# Patient Record
Sex: Female | Born: 1960
Health system: Southern US, Community
[De-identification: ages and names within clinical notes are randomized; demographics above are authoritative.]

## PROBLEM LIST (undated history)

## (undated) DIAGNOSIS — D649 Anemia, unspecified: Secondary | ICD-10-CM

## (undated) DIAGNOSIS — K56609 Unspecified intestinal obstruction, unspecified as to partial versus complete obstruction: Principal | ICD-10-CM

## (undated) DIAGNOSIS — I10 Essential (primary) hypertension: Secondary | ICD-10-CM

## (undated) DIAGNOSIS — C801 Malignant (primary) neoplasm, unspecified: Secondary | ICD-10-CM

---
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
## 2006-05-02 ENCOUNTER — Emergency Department (HOSPITAL_COMMUNITY): Admission: EM | Admit: 2006-05-02 | Discharge: 2006-05-02 | Payer: Self-pay | Admitting: Emergency Medicine

## 2019-01-04 DIAGNOSIS — Z9221 Personal history of antineoplastic chemotherapy: Secondary | ICD-10-CM

## 2019-01-04 DIAGNOSIS — C189 Malignant neoplasm of colon, unspecified: Secondary | ICD-10-CM

## 2019-01-04 HISTORY — DX: Personal history of antineoplastic chemotherapy: Z92.21

## 2019-01-04 HISTORY — DX: Malignant neoplasm of colon, unspecified: C18.9

## 2019-01-23 ENCOUNTER — Other Ambulatory Visit: Payer: Self-pay

## 2019-01-23 ENCOUNTER — Inpatient Hospital Stay (HOSPITAL_COMMUNITY)
Admission: EM | Admit: 2019-01-23 | Discharge: 2019-02-03 | DRG: 330 | Disposition: A | Discharge status: Home Health | Payer: BC Managed Care – PPO | Attending: Internal Medicine | Admitting: Internal Medicine

## 2019-01-23 ENCOUNTER — Emergency Department (HOSPITAL_COMMUNITY): Payer: BC Managed Care – PPO

## 2019-01-23 ENCOUNTER — Encounter (HOSPITAL_COMMUNITY): Payer: Self-pay | Admitting: *Deleted

## 2019-01-23 DIAGNOSIS — Z4682 Encounter for fitting and adjustment of non-vascular catheter: Secondary | ICD-10-CM | POA: Diagnosis not present

## 2019-01-23 DIAGNOSIS — Z6831 Body mass index (BMI) 31.0-31.9, adult: Secondary | ICD-10-CM

## 2019-01-23 DIAGNOSIS — N179 Acute kidney failure, unspecified: Secondary | ICD-10-CM | POA: Diagnosis not present

## 2019-01-23 DIAGNOSIS — R739 Hyperglycemia, unspecified: Secondary | ICD-10-CM | POA: Diagnosis not present

## 2019-01-23 DIAGNOSIS — E860 Dehydration: Secondary | ICD-10-CM | POA: Diagnosis not present

## 2019-01-23 DIAGNOSIS — K6389 Other specified diseases of intestine: Secondary | ICD-10-CM | POA: Diagnosis not present

## 2019-01-23 DIAGNOSIS — F17210 Nicotine dependence, cigarettes, uncomplicated: Secondary | ICD-10-CM | POA: Diagnosis not present

## 2019-01-23 DIAGNOSIS — I10 Essential (primary) hypertension: Secondary | ICD-10-CM | POA: Diagnosis not present

## 2019-01-23 DIAGNOSIS — Z79899 Other long term (current) drug therapy: Secondary | ICD-10-CM | POA: Diagnosis not present

## 2019-01-23 DIAGNOSIS — Z841 Family history of disorders of kidney and ureter: Secondary | ICD-10-CM

## 2019-01-23 DIAGNOSIS — K56609 Unspecified intestinal obstruction, unspecified as to partial versus complete obstruction: Secondary | ICD-10-CM | POA: Diagnosis not present

## 2019-01-23 DIAGNOSIS — D62 Acute posthemorrhagic anemia: Secondary | ICD-10-CM | POA: Diagnosis not present

## 2019-01-23 DIAGNOSIS — D509 Iron deficiency anemia, unspecified: Secondary | ICD-10-CM

## 2019-01-23 DIAGNOSIS — T8189XA Other complications of procedures, not elsewhere classified, initial encounter: Secondary | ICD-10-CM | POA: Diagnosis not present

## 2019-01-23 DIAGNOSIS — I161 Hypertensive emergency: Secondary | ICD-10-CM | POA: Diagnosis not present

## 2019-01-23 DIAGNOSIS — E46 Unspecified protein-calorie malnutrition: Secondary | ICD-10-CM | POA: Diagnosis not present

## 2019-01-23 DIAGNOSIS — Z03818 Encounter for observation for suspected exposure to other biological agents ruled out: Secondary | ICD-10-CM | POA: Diagnosis not present

## 2019-01-23 DIAGNOSIS — F17200 Nicotine dependence, unspecified, uncomplicated: Secondary | ICD-10-CM | POA: Diagnosis not present

## 2019-01-23 DIAGNOSIS — E876 Hypokalemia: Secondary | ICD-10-CM | POA: Diagnosis not present

## 2019-01-23 DIAGNOSIS — C787 Secondary malignant neoplasm of liver and intrahepatic bile duct: Secondary | ICD-10-CM | POA: Diagnosis not present

## 2019-01-23 DIAGNOSIS — D72829 Elevated white blood cell count, unspecified: Secondary | ICD-10-CM | POA: Diagnosis not present

## 2019-01-23 DIAGNOSIS — C189 Malignant neoplasm of colon, unspecified: Secondary | ICD-10-CM | POA: Diagnosis not present

## 2019-01-23 DIAGNOSIS — C772 Secondary and unspecified malignant neoplasm of intra-abdominal lymph nodes: Secondary | ICD-10-CM | POA: Diagnosis present

## 2019-01-23 DIAGNOSIS — K5669 Other partial intestinal obstruction: Secondary | ICD-10-CM | POA: Diagnosis not present

## 2019-01-23 DIAGNOSIS — Z20828 Contact with and (suspected) exposure to other viral communicable diseases: Secondary | ICD-10-CM | POA: Diagnosis not present

## 2019-01-23 DIAGNOSIS — Z4659 Encounter for fitting and adjustment of other gastrointestinal appliance and device: Secondary | ICD-10-CM

## 2019-01-23 DIAGNOSIS — C799 Secondary malignant neoplasm of unspecified site: Secondary | ICD-10-CM

## 2019-01-23 DIAGNOSIS — Z8249 Family history of ischemic heart disease and other diseases of the circulatory system: Secondary | ICD-10-CM | POA: Diagnosis not present

## 2019-01-23 DIAGNOSIS — K5989 Other specified functional intestinal disorders: Secondary | ICD-10-CM | POA: Diagnosis not present

## 2019-01-23 DIAGNOSIS — C184 Malignant neoplasm of transverse colon: Secondary | ICD-10-CM | POA: Diagnosis not present

## 2019-01-23 DIAGNOSIS — R05 Cough: Secondary | ICD-10-CM | POA: Diagnosis not present

## 2019-01-23 DIAGNOSIS — K5900 Constipation, unspecified: Secondary | ICD-10-CM | POA: Diagnosis not present

## 2019-01-23 HISTORY — DX: Unspecified intestinal obstruction, unspecified as to partial versus complete obstruction: K56.609

## 2019-01-23 HISTORY — DX: Essential (primary) hypertension: I10

## 2019-01-23 LAB — CBC
HCT: 40.2 % (ref 36.0–46.0)
Hemoglobin: 12.7 g/dL (ref 12.0–15.0)
MCH: 23.1 pg — ABNORMAL LOW (ref 26.0–34.0)
MCHC: 31.6 g/dL (ref 30.0–36.0)
MCV: 73.2 fL — ABNORMAL LOW (ref 80.0–100.0)
Platelets: 299 10*3/uL (ref 150–400)
RBC: 5.49 MIL/uL — ABNORMAL HIGH (ref 3.87–5.11)
RDW: 19.8 % — ABNORMAL HIGH (ref 11.5–15.5)
WBC: 11 10*3/uL — ABNORMAL HIGH (ref 4.0–10.5)
nRBC: 0 % (ref 0.0–0.2)

## 2019-01-23 LAB — COMPREHENSIVE METABOLIC PANEL
ALT: 11 U/L (ref 0–44)
AST: 17 U/L (ref 15–41)
Albumin: 4.2 g/dL (ref 3.5–5.0)
Alkaline Phosphatase: 90 U/L (ref 38–126)
Anion gap: 14 (ref 5–15)
BUN: 13 mg/dL (ref 6–20)
CO2: 20 mmol/L — ABNORMAL LOW (ref 22–32)
Calcium: 10.9 mg/dL — ABNORMAL HIGH (ref 8.9–10.3)
Chloride: 101 mmol/L (ref 98–111)
Creatinine, Ser: 0.92 mg/dL (ref 0.44–1.00)
GFR calc Af Amer: >60 mL/min (ref >60)
GFR calc non Af Amer: >60 mL/min (ref >60)
Glucose, Bld: 141 mg/dL — ABNORMAL HIGH (ref 70–99)
Potassium: 3.5 mmol/L (ref 3.5–5.1)
Sodium: 135 mmol/L (ref 135–145)
Total Bilirubin: 0.4 mg/dL (ref 0.3–1.2)
Total Protein: 8.6 g/dL — ABNORMAL HIGH (ref 6.5–8.1)

## 2019-01-23 LAB — SURGICAL PCR SCREEN
MRSA, PCR: NEGATIVE
Staphylococcus aureus: NEGATIVE

## 2019-01-23 LAB — HEMOGLOBIN A1C
Hgb A1c MFr Bld: 5.2 % (ref 4.8–5.6)
Mean Plasma Glucose: 102.54 mg/dL

## 2019-01-23 LAB — TSH: TSH: 1.141 u[IU]/mL (ref 0.350–4.500)

## 2019-01-23 LAB — I-STAT BETA HCG BLOOD, ED (MC, WL, AP ONLY): I-stat hCG, quantitative: <5 m[IU]/mL (ref <5)

## 2019-01-23 LAB — HIV ANTIBODY (ROUTINE TESTING W REFLEX): HIV Screen 4th Generation wRfx: NONREACTIVE

## 2019-01-23 LAB — SARS CORONAVIRUS 2 (TAT 6-24 HRS): SARS Coronavirus 2: NEGATIVE

## 2019-01-23 LAB — LIPASE, BLOOD: Lipase: 17 U/L (ref 11–51)

## 2019-01-23 MED ORDER — ONDANSETRON HCL 4 MG/2ML IJ SOLN: 4.0000 mg | Freq: Once | INTRAMUSCULAR | Status: DC

## 2019-01-23 MED ORDER — SODIUM CHLORIDE 0.9 % IV SOLN
1.0000 g | INTRAVENOUS | Status: AC
  Administered 2019-01-24: 1 g via INTRAVENOUS
  Filled 2019-01-23 (×2): qty 1

## 2019-01-23 MED ORDER — ONDANSETRON HCL 4 MG/2ML IJ SOLN
4.0000 mg | Freq: Four times a day (QID) | INTRAMUSCULAR | Status: DC | PRN
  Administered 2019-01-23 – 2019-01-29 (×4): 4 mg via INTRAVENOUS
  Filled 2019-01-23 (×5): qty 2

## 2019-01-23 MED ORDER — HYDRALAZINE HCL 20 MG/ML IJ SOLN
10.0000 mg | Freq: Once | INTRAMUSCULAR | Status: AC
  Administered 2019-01-23: 08:00:00 10 mg via INTRAVENOUS
  Filled 2019-01-23: qty 1

## 2019-01-23 MED ORDER — ACETAMINOPHEN 325 MG PO TABS
650.0000 mg | ORAL_TABLET | Freq: Four times a day (QID) | ORAL | Status: DC | PRN
  Administered 2019-01-23: 650 mg via ORAL
  Filled 2019-01-23: qty 2

## 2019-01-23 MED ORDER — KETOROLAC TROMETHAMINE 30 MG/ML IJ SOLN
30.0000 mg | Freq: Once | INTRAMUSCULAR | Status: AC
  Administered 2019-01-23: 05:00:00 30 mg via INTRAVENOUS
  Filled 2019-01-23: qty 1

## 2019-01-23 MED ORDER — SORBITOL 70 % SOLN
960.0000 mL | TOPICAL_OIL | Freq: Once | ORAL | Status: DC
  Filled 2019-01-23: qty 473

## 2019-01-23 MED ORDER — HYDRALAZINE HCL 20 MG/ML IJ SOLN
5.0000 mg | INTRAMUSCULAR | Status: DC | PRN
  Administered 2019-01-23 – 2019-01-27 (×6): 5 mg via INTRAVENOUS
  Filled 2019-01-23 (×6): qty 1

## 2019-01-23 MED ORDER — ACETAMINOPHEN 650 MG RE SUPP: 650.0000 mg | Freq: Four times a day (QID) | RECTAL | Status: DC | PRN

## 2019-01-23 MED ORDER — AMLODIPINE BESYLATE 5 MG PO TABS
10.0000 mg | ORAL_TABLET | Freq: Once | ORAL | Status: AC
  Administered 2019-01-23: 10 mg via ORAL
  Filled 2019-01-23: qty 2

## 2019-01-23 MED ORDER — ONDANSETRON HCL 4 MG PO TABS: 4.0000 mg | ORAL_TABLET | Freq: Four times a day (QID) | ORAL | Status: DC | PRN

## 2019-01-23 MED ORDER — IOHEXOL 300 MG/ML  SOLN
100.0000 mL | Freq: Once | INTRAMUSCULAR | Status: AC
  Administered 2019-01-23: 100 mL via INTRAVENOUS

## 2019-01-23 MED ORDER — ONDANSETRON HCL 4 MG/2ML IJ SOLN
INTRAMUSCULAR | Status: AC
  Filled 2019-01-23: qty 2

## 2019-01-23 MED ORDER — HEPARIN SODIUM (PORCINE) 5000 UNIT/ML IJ SOLN
5000.0000 [IU] | Freq: Three times a day (TID) | INTRAMUSCULAR | Status: DC
  Administered 2019-01-23 – 2019-02-03 (×29): 5000 [IU] via SUBCUTANEOUS
  Filled 2019-01-23 (×29): qty 1

## 2019-01-23 MED ORDER — MORPHINE SULFATE (PF) 2 MG/ML IV SOLN
2.0000 mg | INTRAVENOUS | Status: DC | PRN
  Administered 2019-01-23 – 2019-01-24 (×2): 2 mg via INTRAVENOUS
  Filled 2019-01-23 (×2): qty 1

## 2019-01-23 MED ORDER — ONDANSETRON HCL 4 MG/2ML IJ SOLN
4.0000 mg | Freq: Once | INTRAMUSCULAR | Status: AC
  Administered 2019-01-23: 05:00:00 4 mg via INTRAVENOUS
  Filled 2019-01-23: qty 2

## 2019-01-23 MED ORDER — NICOTINE 14 MG/24HR TD PT24
14.0000 mg | MEDICATED_PATCH | Freq: Every day | TRANSDERMAL | Status: DC
  Administered 2019-01-23 – 2019-01-31 (×9): 14 mg via TRANSDERMAL
  Filled 2019-01-23 (×9): qty 1

## 2019-01-23 MED ORDER — SODIUM CHLORIDE 0.9 % IV BOLUS (SEPSIS)
1000.0000 mL | Freq: Once | INTRAVENOUS | Status: AC
  Administered 2019-01-23: 05:00:00 1000 mL via INTRAVENOUS

## 2019-01-23 MED ORDER — SODIUM CHLORIDE 0.9% FLUSH
3.0000 mL | Freq: Once | INTRAVENOUS | Status: AC
  Administered 2019-01-23: 05:00:00 3 mL via INTRAVENOUS

## 2019-01-23 MED ORDER — AMLODIPINE BESYLATE 5 MG PO TABS
5.0000 mg | ORAL_TABLET | Freq: Every day | ORAL | Status: DC
  Administered 2019-01-24: 08:00:00 5 mg via ORAL
  Filled 2019-01-23: qty 1

## 2019-01-23 MED ORDER — LACTATED RINGERS IV SOLN
INTRAVENOUS | Status: DC
  Administered 2019-01-23 – 2019-01-27 (×10): via INTRAVENOUS

## 2019-01-23 NOTE — Progress Notes (Signed)
Patients sister Cloyde Reams called inquiring about updates. This nurse received consent from patient to provide [facility] with updates regarding patients care. Molly updated on plans for surgery and all questions answered at this time.

## 2019-01-23 NOTE — H&P (Signed)
History and Physical    Casey Black [id number] DOB: 1961-04-05 DOA: 01/23/2019  PCP: Patient, No Pcp Per - no doctor in >10 years Consultants:  None Patient coming from:  Home - lives with oldest daughter; Casey Black: Daughter, [phone]  Chief Complaint: abdominal pain  HPI: Casey Black is a 58 y.o. female with no known past medical history significant (does not see doctors) presenting with abdominal pain.  She reports that her stomach was giving her problems.  When she would eat, it was very painful - couldn't lay down or sit up.  She felt constipated and took 2 Senokot.  About 0100, she developed n/v and was unable to stop and so her daughter brought her in.  The symptoms started maybe 2 months ago but was intermittent.  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  She has had marked unintentional weight loss in the last few months.   ED Course: SBO on Xray.  Getting CT scan.  Dr. Tomasita Morrow has seen, surgery will follow.  NGT in place.  IV hydralazine for severe HTN, given Norvasc earlier.    Review of Systems: As per HPI; otherwise review of systems reviewed and negative.   Ambulatory Status:  Ambulates without assistance  Past Medical History:  Diagnosis Date   Hypertension 01/23/2019   SBO (small bowel obstruction) ([place]) 01/23/2019    Past Surgical History:  Procedure Laterality Date   CESAREAN SECTION     x2    Social History   Socioeconomic History   Marital status: Married    Spouse name: Not on file   Number of children: Not on file   Years of education: Not on file   Highest education level: Not on file  Occupational History   Not on file  Social Needs   Financial resource strain: Not on file   Food insecurity    Worry: Not on file    Inability: Not on file   Transportation needs    Medical: Not on file    Non-medical: Not on file  Tobacco Use   Smoking status: Current Every Day Smoker    Packs/day: 1.00    Years: 26.00      Pack years: 26.00    Types: Cigarettes   Smokeless tobacco: Never Used   Tobacco comment: desires patch  Substance and Sexual Activity   Alcohol use: Yes    Comment: a pint a week   Drug use: Never   Sexual activity: Not on file  Lifestyle   Physical activity    Days per week: Not on file    Minutes per session: Not on file   Stress: Not on file  Relationships   Social connections    Talks on phone: Not on file    Gets together: Not on file    Attends religious service: Not on file    Active member of club or organization: Not on file    Attends meetings of clubs or organizations: Not on file    Relationship status: Not on file   Intimate partner violence    Fear of current or ex partner: Not on file    Emotionally abused: Not on file    Physically abused: Not on file    Forced sexual activity: Not on file  Other Topics Concern   Not on file  Social History Narrative   Not on file    No Known Allergies  Family History  Problem Relation Age of Onset  Kidney failure Father    Hypertension Father    Hypertension Sister     Prior to Admission medications   Not on File    Physical Exam: Vitals:   01/23/19 0747 01/23/19 0800 01/23/19 0815 01/23/19 1103  BP: (!) 215/83 (!) 181/88 (!) 197/69 (!) 183/67  Pulse: 69   86  Resp: (!) 23 (!) 21 (!) 22 20  Temp:    98.7 F (37.1 C)  TempSrc:    Oral  SpO2: 97%   98%      General:  Appears calm and comfortable and is NAD  Eyes:  PERRL, EOMI, normal lids, iris  ENT:  grossly normal hearing, lips & tongue, mmm; edentulous  Neck:  no LAD, masses or thyromegaly  Cardiovascular:  RRR, no m/r/g. No LE edema.   Respiratory:   CTA bilaterally with no wheezes/rales/rhonchi.  Normal respiratory effort.  Abdomen:  soft, mildly to moderately TTP primarily in LLQ, mildly distended, hypoactive BS  Back:   normal alignment, no CVAT  Skin:  no rash or induration seen on limited exam  Musculoskeletal:   grossly normal tone BUE/BLE, good ROM, no bony abnormality  Psychiatric:  blunted mood and affect, speech fluent and appropriate, AOx3  Neurologic:  CN 2-12 grossly intact, moves all extremities in coordinated fashion, sensation intact    Radiological Exams on Admission: Ct Abdomen Pelvis W Contrast  Result Date: 01/23/2019 CLINICAL DATA:  Acute abdominal pain with generalized small bowel obstruction. EXAM: CT ABDOMEN AND PELVIS WITH CONTRAST TECHNIQUE: Multidetector CT imaging of the abdomen and pelvis was performed using the standard protocol following bolus administration of intravenous contrast. CONTRAST:  100 cc OMNIPAQUE IOHEXOL 300 MG/ML  SOLN COMPARISON:  None. FINDINGS: Lower chest: Right coronary calcification. Mild right lower lobe scarring along endplate osteophytes. Hepatobiliary: Multiple partially calcified liver lesions consistent with metastatic disease given the constellation of findings, up to 25 mm in the subcapsular right liver.No evidence of biliary obstruction or stone. Pancreas: Unremarkable. Spleen: Unremarkable. Adrenals/Urinary Tract: Negative adrenals. No hydronephrosis or stone. Unremarkable bladder. Stomach/Bowel: Irregular mass at the mid transverse colon with proximal colonic and small bowel dilatation. There is lobulation along the serosal surface. No appendicitis. Vascular/Lymphatic: Mildly enlarged transverse mesocolon lymph node near the mass, 8 mm in diameter. No acute vascular finding Reproductive:Small uterine fibroids. Other: Tumor appears to extend through the serosa but there is no ascites or evident peritoneal nodularity. Musculoskeletal: Degenerative changes without acute or aggressive finding IMPRESSION: Obstructing mid transverse colonic mass with mild regional adenopathy and hepatic metastatic disease. The mass likely extends through the serosa; no ascites or peritoneal nodularity. Electronically Signed   By: Monte Fantasia M.D.   On: 01/23/2019 07:48    Dg Abdomen Acute W/chest  Result Date: 01/23/2019 CLINICAL DATA:  Constipation. Cough. Nausea and vomiting. EXAM: DG ABDOMEN ACUTE W/ 1V CHEST COMPARISON:  None. FINDINGS: Lungs are clear. Normal cardiomediastinal contours. No focal airspace disease. No pleural effusion. Dilated small bowel with air-fluid levels. Minimal stool in the rectosigmoid colon, otherwise no significant formed stool. No evidence of free air. No radiopaque calculi or abnormal soft tissue calcifications. No osseous abnormalities. IMPRESSION: 1. Bowel-gas pattern consistent with small bowel obstruction. No free air. 2. No acute chest findings. Electronically Signed   By: Keith Rake M.D.   On: 01/23/2019 05:51   Dg Abd Portable 1 View  Result Date: 01/23/2019 CLINICAL DATA:  Nasogastric tube placement. EXAM: PORTABLE ABDOMEN - 1 VIEW COMPARISON:  Same day. FINDINGS: Nasogastric tube tip  is seen in proximal stomach. Residual intravenous contrast is seen within the nondilated renal collecting systems. Mildly dilated small bowel loops are noted concerning for distal small bowel obstruction. No large bowel dilatation is noted. IMPRESSION: Distal tip of nasogastric tube seen in proximal stomach. Mildly dilated small bowel loops are noted concerning for distal small bowel obstruction. Electronically Signed   By: Marijo Conception M.D.   On: 01/23/2019 08:35    EKG: Independently reviewed.  NSR with rate 95; nonspecific ST changes with no evidence of acute ischemia   Labs on Admission: I have personally reviewed the available labs and imaging studies at the time of the admission.  Pertinent labs:   CO2 20 Glucose 141 WBC 11.0 HCG negative COVID pending   Assessment/Plan Active Problems:   SBO (small bowel obstruction) (HCC)   Hypertension   Colonic mass   Tobacco dependence   Hyperglycemia   SBO -Patient who has not received medical care in >10 years (but has insurance) presenting with progressive abdominal  pain, n/v, anorexia, and unintentional weight loss -Initial xray suggesting of SBO -Patient with prior h/o 2 abdominal surgeries (LTCS) -Gen Surg consulted by ER and recommended CT; currently no indication for surgical intervention -NPO for bowel rest -NG tube in place -IVF hydration -Pain control with morphine  Colonic mass -Unfortunately, CT scan showed what is almost certainly colon cancer with mass obstructing the transverse colon and with hepatic metastases -With this new information, the patient appears likely to need colectomy and surgery is tentatively planned for this afternoon or tomorrow. -Per Dr. Kae Heller, the patient may benefit from C-scope with biopsy and stent placement and so GI was consulted. -She is likely to need chemotherapy (rads?) post-operatively, but this will not happen emergently. -The patient has been informed of this finding and was counseled in the presence of her sister.  Hyperglycemia -While this may simply be an acute phase response, since the patient has not received medical care in many years it is also possible that she has diabetes -Will check A1c -For now, no further intervention is planned  HTN -While she does not have a prior diagnosis of HTN, her BP is markedly elevated -This could simply be an acute response, but given her strong FH of HTN and marked BP elevation, this is most likely essential HTN -Will start Norvasc 5 mg PO daily for now -Will add prn IV hydralazine  Tobacco dependence -Encourage cessation.   -This was discussed with the patient and should be reviewed on an ongoing basis.   -Patch ordered at patient request.    Note: This patient has been tested and is pending for the novel coronavirus COVID-19.   DVT prophylaxis:  Heparin Code Status:  Full - confirmed with patient Family Communication: None present initially; her sister was called and came to see her and was notified of her diagnosis Disposition Plan:  Home once  clinically improved Consults called: Surgery; GI  Admission status: Admit - It is my clinical opinion that admission to INPATIENT is reasonable and necessary because of the expectation that this patient will require hospital care that crosses at least 2 midnights to treat this condition based on the medical complexity of the problems presented.  Given the aforementioned information, the predictability of an adverse outcome is felt to be significant.    Karmen Bongo MD [facility]   How to contact the [facility] Attending or Consulting provider [place] or covering provider during after hours [place], for this  patient?  1. Check the care team in [facility] and look for a) attending/consulting [facility] provider listed and b) the [facility] team listed 2. Log into [email or web address] and use [place]'s universal password to access. If you do not have the password, please contact the hospital operator. 3. Locate the [facility] provider you are looking for under [facility] and page to a number that you can be directly reached. 4. If you still have difficulty reaching the provider, please page the [facility] (Director on Call) for the Hospitalists listed on amion for assistance.   01/23/2019, 11:08 AM

## 2019-01-23 NOTE — Progress Notes (Signed)
CT reviewed, discussed with patient. 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 GI consult for c-scope/ biopsy, stent if feasible. If unable to stent will plan  Partial colectomy w colostomy this admission.

## 2019-01-23 NOTE — Consult Note (Signed)
Reason for Consult: bowel obstruction Referring Physician: ED  Casey Black is an 58 y.o. female.   HPI: 64F with a history of worsening abdominal pain, distention, and nausea since Friday 10/16. She began vomiting yesterday 10/19, which prompted her presentation to the ED. She reports not passing any stool since her symptoms began.   Past Medical History:  Diagnosis Date  . Hypertension 01/23/2019  . SBO (small bowel obstruction) ([place]) 01/23/2019    Past Surgical History:  Procedure Laterality Date  . CESAREAN SECTION     x2    Family History  Problem Relation Age of Onset  . Kidney failure Father   . Hypertension Father   . Hypertension Sister     Social History:  reports that she has been smoking cigarettes. She has a 26.00 pack-year smoking history. She has never used smokeless tobacco. She reports current alcohol use. She reports that she does not use drugs.  Allergies: No Known Allergies  Medications: I have reviewed the patient's current medications.  Results for orders placed or performed during the hospital encounter of 01/23/19 (from the past 48 hour(s))  Lipase, blood     Status: None   Collection Time: 01/23/19  3:07 AM  Result Value Ref [place]   Lipase 17 11 - 51 U/L    Comment: Performed at [facility], [place] [address] [place], [place] [address]  Comprehensive metabolic panel     Status: Abnormal   Collection Time: 01/23/19  3:07 AM  Result Value Ref [place]   Sodium 135 135 - 145 mmol/L   Potassium 3.5 3.5 - 5.1 mmol/L   Chloride 101 98 - 111 mmol/L   CO2 20 (L) 22 - 32 mmol/L   Glucose, Bld 141 (H) 70 - 99 mg/dL   BUN 13 6 - 20 mg/dL   Creatinine, Ser 0.92 0.44 - 1.00 mg/dL   Calcium 10.9 (H) 8.9 - 10.3 mg/dL   Total Protein 8.6 (H) 6.5 - 8.1 g/dL   Albumin 4.2 3.5 - 5.0 g/dL   AST 17 15 - 41 U/L   ALT 11 0 - 44 U/L   Alkaline Phosphatase 90 38 - 126 U/L   Total Bilirubin 0.4 0.3 - 1.2 mg/dL   GFR calc non Af Amer >60 >60 mL/min   GFR calc Af Amer >60 >60 mL/min   Anion gap 14 5 - 15    Comment: Performed at [place] [address] [place], [place] [address]  CBC     Status: Abnormal   Collection Time: 01/23/19  3:07 AM  Result Value Ref [place]   WBC 11.0 (H) 4.0 - 10.5 K/uL   RBC 5.49 (H) 3.87 - 5.11 MIL/uL   Hemoglobin 12.7 12.0 - 15.0 g/dL   HCT 40.2 36.0 - 46.0 %   MCV 73.2 (L) 80.0 - 100.0 fL   MCH 23.1 (L) 26.0 - 34.0 pg   MCHC 31.6 30.0 - 36.0 g/dL   RDW 19.8 (H) 11.5 - 15.5 %   Platelets 299 150 - 400 K/uL    Comment: REPEATED TO VERIFY   nRBC 0.0 0.0 - 0.2 %    Comment: Performed at [facility], [place] [address], [place], [place] [address]  I-Stat beta hCG blood, ED     Status: None   Collection Time: 01/23/19  3:19 AM  Result Value Ref [place]   I-stat hCG, quantitative <5.0 <5 mIU/mL   Comment 3  Comment:   GEST. AGE      CONC.  (mIU/mL)   <=1 WEEK        5 - 50     2 WEEKS       50 - 500     3 WEEKS       100 - 10,000     4 WEEKS     1,000 - 30,000        FEMALE AND NON-PREGNANT FEMALE:     LESS THAN 5 mIU/mL     Ct Abdomen Pelvis W Contrast  Result Date: 01/23/2019 CLINICAL DATA:  Acute abdominal pain with generalized small bowel obstruction. EXAM: CT ABDOMEN AND PELVIS WITH CONTRAST TECHNIQUE: Multidetector CT imaging of the abdomen and pelvis was performed using the standard protocol following bolus administration of intravenous contrast. CONTRAST:  100 cc OMNIPAQUE IOHEXOL 300 MG/ML  SOLN COMPARISON:  None. FINDINGS: Lower chest: Right coronary calcification. Mild right lower lobe scarring along endplate osteophytes. Hepatobiliary: Multiple partially calcified liver lesions consistent with metastatic disease given the constellation of findings, up to 25 mm in the subcapsular right liver.No evidence of biliary obstruction or stone. Pancreas: Unremarkable. Spleen: Unremarkable. Adrenals/Urinary Tract: Negative adrenals. No hydronephrosis or stone. Unremarkable bladder.  Stomach/Bowel: Irregular mass at the mid transverse colon with proximal colonic and small bowel dilatation. There is lobulation along the serosal surface. No appendicitis. Vascular/Lymphatic: Mildly enlarged transverse mesocolon lymph node near the mass, 8 mm in diameter. No acute vascular finding Reproductive:Small uterine fibroids. Other: Tumor appears to extend through the serosa but there is no ascites or evident peritoneal nodularity. Musculoskeletal: Degenerative changes without acute or aggressive finding IMPRESSION: Obstructing mid transverse colonic mass with mild regional adenopathy and hepatic metastatic disease. The mass likely extends through the serosa; no ascites or peritoneal nodularity. Electronically Signed   By: Monte Fantasia M.D.   On: 01/23/2019 07:48   Dg Abdomen Acute W/chest  Result Date: 01/23/2019 CLINICAL DATA:  Constipation. Cough. Nausea and vomiting. EXAM: DG ABDOMEN ACUTE W/ 1V CHEST COMPARISON:  None. FINDINGS: Lungs are clear. Normal cardiomediastinal contours. No focal airspace disease. No pleural effusion. Dilated small bowel with air-fluid levels. Minimal stool in the rectosigmoid colon, otherwise no significant formed stool. No evidence of free air. No radiopaque calculi or abnormal soft tissue calcifications. No osseous abnormalities. IMPRESSION: 1. Bowel-gas pattern consistent with small bowel obstruction. No free air. 2. No acute chest findings. Electronically Signed   By: Keith Rake M.D.   On: 01/23/2019 05:51    ROS Blood pressure (!) 197/69, pulse 69, temperature (!) 97.4 F (36.3 C), temperature source Oral, resp. rate (!) 22, SpO2 97 %.    Physical Exam Gen: mildly uncomfortable Neuro: non-focal exam Neck: supple CV: RRR Pulm: unlabored breathing Abd: soft, distended, diffusely TTP, but no rebound or guarding Extr: wwp, no edema    Assessment/Plan: 2F with abdominal pain, n/v, and abdominal films with multiple air-fluid levels.   Recommend NGT placement and CT A&P with IV and PO contrast. Will follow up imaging and defer remaining management to day team.   Casey Black 01/23/2019, 8:28 AM

## 2019-01-23 NOTE — ED Notes (Signed)
Patient transported to CT 

## 2019-01-23 NOTE — ED Provider Notes (Addendum)
TIME SEEN: 4:46 AM  CHIEF COMPLAINT: Abdominal pain, nausea and vomiting  HPI: Patient is a 58 year old female with history of tobacco use who presents with diffuse abdominal discomfort for the past several days with nausea and vomiting.  States her last bowel movement was over a week ago.  She is not sure if she is passing gas.  She believes that she is constipated.  Took Senokot once without any relief and states that is when she started vomiting.  No history of bowel obstruction.  Initially patient tells me that she has never had an abdominal surgery but on reevaluation tells me that she has had a C-section.  No fevers or chills.  Patient is extremely hypertensive here.  She denies headache, vision changes, chest pain, shortness of breath, numbness, tingling or focal weakness.  She states it has been years since she has seen a doctor in years since she has had her blood pressure checked.  ROS: See HPI Constitutional: no fever  Eyes: no drainage  ENT: no runny nose   Cardiovascular:  no chest pain  Resp: no SOB  GI: vomiting GU: no dysuria Integumentary: no rash  Allergy: no hives  Musculoskeletal: no leg swelling  Neurological: no slurred speech ROS otherwise negative  PAST MEDICAL HISTORY/PAST SURGICAL HISTORY:  History reviewed. No pertinent past medical history.  MEDICATIONS:  Prior to Admission medications   Not on File    ALLERGIES:  No Known Allergies  SOCIAL HISTORY:  Social History   Tobacco Use  . Smoking status: Current Every Day Smoker  Substance Use Topics  . Alcohol use: Yes    FAMILY HISTORY: No family history on file.  EXAM: BP (!) 216/102 (BP Location: Right Arm)   Pulse 61   Temp (!) 97.4 F (36.3 C) (Oral)   Resp 19   SpO2 98%  CONSTITUTIONAL: Alert and oriented and responds appropriately to questions. Well-appearing; well-nourished HEAD: Normocephalic EYES: Conjunctivae clear, pupils appear equal, EOMI ENT: normal nose; moist mucous  membranes NECK: Supple, no meningismus, no nuchal rigidity, no LAD  CARD: RRR; S1 and S2 appreciated; no murmurs, no clicks, no rubs, no gallops RESP: Normal chest excursion without splinting or tachypnea; breath sounds clear and equal bilaterally; no wheezes, no rhonchi, no rales, no hypoxia or respiratory distress, speaking full sentences ABD/GI: Minimally distended abdomen, soft, diffusely tender throughout the abdomen without guarding or rebound BACK:  The back appears normal and is non-tender to palpation, there is no CVA tenderness EXT: Normal ROM in all joints; non-tender to palpation; no edema; normal capillary refill; no cyanosis, no calf tenderness or swelling    SKIN: Normal color for age and race; warm; no rash NEURO: Moves all extremities equally, normal sensation diffusely, cranial nerves II through XII intact, normal speech PSYCH: The patient's mood and manner are appropriate. Grooming and personal hygiene are appropriate.  MEDICAL DECISION MAKING: Patient here with complaints of generalized abdominal pain.  She states she thinks this is constipation.  She has had a previous C-section.  Will obtain x-ray to evaluate for possible bowel obstruction.  If no bowel obstruction, will give enema.  She is also extremely hypertensive but asymptomatic.  Will give oral amlodipine and reassess.  ED PROGRESS: Patient's x-ray concerning for small bowel obstruction.  Will discuss with general surgery.  Will place NG tube and admit to medicine.  She does not have a primary care doctor.  6:50 AM  Discussed with Dr. Bobbye Morton with general surgery.  Surgery will see patient  in consultation.  Will admit to medicine.  Patient still hypertensive.  No h/o same but hasn't seen a doctor in years.  Will give IV Hydralazine.  7:33 AM Discussed patient's case with hospitalist, Dr. Lorin Mercy.  I have recommended admission and patient (and family if present) agree with this plan. Admitting physician will place  admission orders.   I reviewed all nursing notes, vitals, pertinent previous records, EKGs, lab and urine results, imaging (as available).  CTAP pending.   EKG Interpretation  Date/Time:  Tuesday January 23 2019 03:10:21 EDT Ventricular Rate:  95 PR Interval:  116 QRS Duration: 94 QT Interval:  364 QTC Calculation: 457 R Axis:   13 Text Interpretation:  Normal sinus rhythm Possible Left atrial enlargement Septal infarct , age undetermined Abnormal ECG No old tracing to compare Confirmed by Quention Mcneill, Cyril Mourning [phone]) on 01/23/2019 4:46:41 AM        [name] [facility] was evaluated in Emergency Department on 01/23/2019 for the symptoms described in the history of present illness. She was evaluated in the context of the global COVID-19 pandemic, which necessitated consideration that the patient might be at risk for infection with the SARS-CoV-2 virus that causes COVID-19. Institutional protocols and algorithms that pertain to the evaluation of patients at risk for COVID-19 are in a state of rapid change based on information released by regulatory bodies including the CDC and federal and state organizations. These policies and algorithms were followed during the patient's care in the ED.    Rand Etchison, Delice Bison, DO 01/23/19 [place], Delice Bison, DO 01/23/19 [place], Delice Bison, DO 01/23/19 1132

## 2019-01-23 NOTE — Consult Note (Addendum)
[facility] Consult: 11:28 AM 01/23/2019  LOS: 0 days    Referring Provider: Dr Lorin Mercy and Kae Heller  Primary Care Physician:  Patient, No Pcp Per Primary Gastroenterologist: unassigned    Reason for Consultation: Request colonoscopy and colonic stent if possible.   HPI: Casey Black is a 57 y.o. female.  Unremarkable past medical history.  s/p C-section x 2.  Does not go to the doctor.  No prior colonoscopy or EGD.  Beginning around July 2020 she developed a sense that after eating, food was not passing through her stomach and was just sitting there.  This was associated with constipation.  She took laxatives, mostly Senokot, and would have bowel movements sometimes every day, never bloody or melenic stools.  She developed anorexia.  No nausea or vomiting until last night.  For several days the pain has become severe.  Last night she vomited nonbloody material.  Despite laxatives, had not had a bowel movement since late last week.  Endorses weight loss.  Currently weighs 203#, c/w 253# 1 year ago.   Abdominal films confirm SBO. CTAP w contrast: Obstructing mass at mid transverse colon with regional adenopathy.  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  Multiple calcified liver mets.  NG tube placed and has put out a little over 400 cc of clear, nonbloody, light brown liquid.  The NG tube is providing significant relief to her abdominal pain.  Surgery is wondering if the mass may be amenable to stent placement in which case they could avoid diverting colostomy.   Fm Hx negative for colon polyps or cancer, liver disease. Her dad died with complications of end-stage renal disease and was a dialysis patient. 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  Past Medical History:  Diagnosis Date   Hypertension  01/23/2019   SBO (small bowel obstruction) ([facility]) 01/23/2019    Past Surgical History:  Procedure Laterality Date   CESAREAN SECTION     x2    Prior to Admission medications   Not on File    Scheduled Meds:  [START ON 01/24/2019] amLODipine  5 mg Oral Daily   heparin  5,000 Units Subcutaneous Q8H   nicotine  14 mg Transdermal Daily   ondansetron       Infusions:  lactated ringers 100 mL/hr at 01/23/19 1034   PRN Meds: acetaminophen **OR** acetaminophen, hydrALAZINE, morphine injection, ondansetron **OR** ondansetron (ZOFRAN) IV   Allergies as of 01/23/2019   (No Known Allergies)    Family History  Problem Relation Age of Onset   Kidney failure Father    Hypertension Father    Hypertension Sister     Social History   Socioeconomic History   Marital status: Married    Spouse name: Not on file   Number of children: Not on file   Years of education: Not on file   Highest education level: Not on file  Occupational History   Not on file  Social Needs   Financial resource strain: Not on file   Food insecurity    Worry: Not  on file    Inability: Not on file   Transportation needs    Medical: Not on file    Non-medical: Not on file  Tobacco Use   Smoking status: Current Every Day Smoker    Packs/day: 1.00    Years: 26.00    Pack years: 26.00    Types: Cigarettes   Smokeless tobacco: Never Used   Tobacco comment: desires patch  Substance and Sexual Activity   Alcohol use: Yes    Comment: a pint a week   Drug use: Never   Sexual activity: Not on file  Lifestyle   Physical activity    Days per week: Not on file    Minutes per session: Not on file   Stress: Not on file  Relationships   Social connections    Talks on phone: Not on file    Gets together: Not on file    Attends religious service: Not on file    Active member of club or organization: Not on file    Attends meetings of clubs or organizations: Not on file     Relationship status: Not on file   Intimate partner violence    Fear of current or ex partner: Not on file    Emotionally abused: Not on file    Physically abused: Not on file    Forced sexual activity: Not on file  Other Topics Concern   Not on file  Social History Narrative   Not on file    REVIEW OF SYSTEMS: Constitutional: Some weakness and fatigue, not profound. ENT:  No nose bleeds Pulm: No shortness of breath.  No cough. CV:  No LE edema.  No chest pain.  Occasional tachycardia. GU:  No hematuria, no frequency GI: See HPI. Heme: Denies excessive or unusual bleeding or bruising. Transfusions: None Neuro:  No headaches, no peripheral tingling or numbness.  No syncope.  No seizures. Derm:  No itching, no rash or sores.  Endocrine:  No sweats or chills.  No polyuria or dysuria Immunization: Not queried.  Doubt she has had her flu shot given that she never goes to the doctor. Travel:  None beyond local counties in last few months.    PHYSICAL EXAM: Vital signs in last 24 hours: Vitals:   01/23/19 0815 01/23/19 1103  BP: (!) 197/69 (!) 183/67  Pulse:  86  Resp: (!) 22 20  Temp:  98.7 F (37.1 C)  SpO2:  98%   Wt Readings from Last 3 Encounters:  No data found for Wt    General: Very pleasant, comfortable Head: No facial asymmetry or swelling.  No signs of head trauma. Eyes: Muddy sclera but no scleral icterus.  No conjunctival pallor.  EOMI. Ears: Not hard of hearing Nose: No discharge or congestion.  NG tube in place. Mouth: Edentulous.  Oral mucosa moist, pink, clear.  Tongue midline. Neck: No JVD, no masses, no thyromegaly. Lungs: Clear bilaterally.  No labored breathing, no cough. Heart: RRR.  No MRG.  S1, S2 present Abdomen: Soft.  Diffusely tender mostly in the upper and mid abdomen.  No guarding or rebound.  Bowel sounds active.  NG tube output is clear, light brown, nonbloody..   Rectal: Deferred Musc/Skeltl: No joint redness, swelling or gross  deformity. Extremities: No CCE. Neurologic: Alert.  Oriented x3.  Good historian.  No tremor or limb weakness.  No gross deficits. Skin: No rash, no sores. Tattoos: None observed. Psych: Pleasant, cooperative, fluid speech.  Intake/Output from previous day:  No intake/output data recorded. Intake/Output this shift: Total I/O In: 1000 [IV Piggyback:1000] Out: -   LAB RESULTS: Recent Labs    01/23/19 0307  WBC 11.0*  HGB 12.7  HCT 40.2  PLT 299   BMET Lab Results  Component Value Date   NA 135 01/23/2019   K 3.5 01/23/2019   CL 101 01/23/2019   CO2 20 (L) 01/23/2019   GLUCOSE 141 (H) 01/23/2019   BUN 13 01/23/2019   CREATININE 0.92 01/23/2019   CALCIUM 10.9 (H) 01/23/2019   LFT Recent Labs    01/23/19 0307  PROT 8.6*  ALBUMIN 4.2  AST 17  ALT 11  ALKPHOS 90  BILITOT 0.4   PT/INR No results found for: INR, PROTIME Hepatitis Panel No results for input(s): HEPBSAG, HCVAB, HEPAIGM, HEPBIGM in the last 72 hours. C-Diff No components found for: CDIFF Lipase     Component Value Date/Time   LIPASE 17 01/23/2019 0307    Drugs of Abuse  No results found for: LABOPIA, COCAINSCRNUR, LABBENZ, AMPHETMU, THCU, LABBARB   RADIOLOGY STUDIES: Ct Abdomen Pelvis W Contrast  Result Date: 01/23/2019 CLINICAL DATA:  Acute abdominal pain with generalized small bowel obstruction. EXAM: CT ABDOMEN AND PELVIS WITH CONTRAST TECHNIQUE: Multidetector CT imaging of the abdomen and pelvis was performed using the standard protocol following bolus administration of intravenous contrast. CONTRAST:  100 cc OMNIPAQUE IOHEXOL 300 MG/ML  SOLN COMPARISON:  None. FINDINGS: Lower chest: Right coronary calcification. Mild right lower lobe scarring along endplate osteophytes. Hepatobiliary: Multiple partially calcified liver lesions consistent with metastatic disease given the constellation of findings, up to 25 mm in the subcapsular right liver.No evidence of biliary obstruction or stone. Pancreas:  Unremarkable. Spleen: Unremarkable. Adrenals/Urinary Tract: Negative adrenals. No hydronephrosis or stone. Unremarkable bladder. Stomach/Bowel: Irregular mass at the mid transverse colon with proximal colonic and small bowel dilatation. There is lobulation along the serosal surface. No appendicitis. Vascular/Lymphatic: Mildly enlarged transverse mesocolon lymph node near the mass, 8 mm in diameter. No acute vascular finding Reproductive:Small uterine fibroids. Other: Tumor appears to extend through the serosa but there is no ascites or evident peritoneal nodularity. Musculoskeletal: Degenerative changes without acute or aggressive finding IMPRESSION: Obstructing mid transverse colonic mass with mild regional adenopathy and hepatic metastatic disease. The mass likely extends through the serosa; no ascites or peritoneal nodularity. Electronically Signed   By: Monte Fantasia M.D.   On: 01/23/2019 07:48   Dg Abdomen Acute W/chest  Result Date: 01/23/2019 CLINICAL DATA:  Constipation. Cough. Nausea and vomiting. EXAM: DG ABDOMEN ACUTE W/ 1V CHEST COMPARISON:  None. FINDINGS: Lungs are clear. Normal cardiomediastinal contours. No focal airspace disease. No pleural effusion. Dilated small bowel with air-fluid levels. Minimal stool in the rectosigmoid colon, otherwise no significant formed stool. No evidence of free air. No radiopaque calculi or abnormal soft tissue calcifications. No osseous abnormalities. IMPRESSION: 1. Bowel-gas pattern consistent with small bowel obstruction. No free air. 2. No acute chest findings. Electronically Signed   By: Keith Rake M.D.   On: 01/23/2019 05:51   Dg Abd Portable 1 View  Result Date: 01/23/2019 CLINICAL DATA:  Nasogastric tube placement. EXAM: PORTABLE ABDOMEN - 1 VIEW COMPARISON:  Same day. FINDINGS: Nasogastric tube tip is seen in proximal stomach. Residual intravenous contrast is seen within the nondilated renal collecting systems. Mildly dilated small bowel loops  are noted concerning for distal small bowel obstruction. No large bowel dilatation is noted. IMPRESSION: Distal tip of nasogastric tube seen in proximal stomach. Mildly dilated small bowel  loops are noted concerning for distal small bowel obstruction. Electronically Signed   By: Marijo Conception M.D.   On: 01/23/2019 08:35      IMPRESSION:   *  Obstructing transverse colon mass Colon cancer with liver mets  *   Microcytosis without anemia.      PLAN:     *   See Dr. Ardis Hughs note/addendum.  His feeling is this mass is too proximal to allow for safe/successful stent placement.  *    Obtain iron studies tomorrow morning   Azucena Freed  01/23/2019, 11:28 AM Phone [phone]  ________________________________________________________________________  Velora Heckler GI MD note:  I personally examined the patient, reviewed the data and agree with the assessment and plan described above.  I reviewed her CT scan images personally and met with her and her daughter in her hospital room today. She has large bowel obstruction from a mid transverse colon tumor, likely colon cancer.  We discussed colonoscopy with colonic stenting.  I explained that her tumor is more proximal in the colon than I feel is feasible for stenting, certainly more proximal in the colon than I have ever stented.  It is also causing a fairly long stricture (7-8cm by my measurement on her CT).   I explained I was willing to try knowing risks of perforation, migration would be higher than usual.  She and her daughter agreed they preferred surgery up front instead.   Owens Loffler, MD [facility] Pager [phone]

## 2019-01-23 NOTE — ED Triage Notes (Signed)
Pt reports abd/epigastric pain that started yesterday with NV starting at 1am.

## 2019-01-23 NOTE — Consult Note (Signed)
[facility] Nurse requested for preoperative stoma site marking  Discussed surgical procedure and stoma creation with patient and family.  Explained role of the [facility] nurse team.  Provided the patient with educational booklet and provided samples of pouching options.  Answered patient and family questions.   Examined patient lying, sitting, and standing in order to place the marking in the patient's visual field, away from any creases or abdominal contour issues and within the rectus muscle.  Attempted to mark below the patient's belt line.   Marked for colostomy in the LLQ  _6___ cm to the left of the umbilicus and [id number] below the umbilicus.  Marked for ileostomy in the RLQ  _4___cm to the right of the umbilicus and  1___ cm below the umbilicus.   Patient's abdomen cleansed with CHG wipes at site markings, allowed to air dry prior to marking.Covered mark with thin film transparent dressing to preserve mark until date of surgery.   [facility] Nurse team will follow up with patient after surgery for continue ostomy care and teaching.  [place] MSN, [address], [place], [place]

## 2019-01-23 NOTE — ED Notes (Signed)
Pt vomiting in triage 

## 2019-01-24 ENCOUNTER — Inpatient Hospital Stay (HOSPITAL_COMMUNITY): Payer: BC Managed Care – PPO | Admitting: Anesthesiology

## 2019-01-24 ENCOUNTER — Encounter (HOSPITAL_COMMUNITY): Payer: Self-pay | Admitting: Orthopedic Surgery

## 2019-01-24 ENCOUNTER — Encounter (HOSPITAL_COMMUNITY)
Admission: EM | Disposition: A | Discharge status: Home Health | Payer: Self-pay | Source: Home / Self Care | Attending: Internal Medicine

## 2019-01-24 HISTORY — PX: PARTIAL COLECTOMY: SHX5273

## 2019-01-24 HISTORY — PX: COLOSTOMY: SHX63

## 2019-01-24 LAB — RETICULOCYTES
Immature Retic Fract: 21 % — ABNORMAL HIGH (ref 2.3–15.9)
RBC.: 4.77 MIL/uL (ref 3.87–5.11)
Retic Count, Absolute: 57.7 10*3/uL (ref 19.0–186.0)
Retic Ct Pct: 1.2 % (ref 0.4–3.1)

## 2019-01-24 LAB — BASIC METABOLIC PANEL
Anion gap: 10 (ref 5–15)
BUN: 19 mg/dL (ref 6–20)
CO2: 24 mmol/L (ref 22–32)
Calcium: 9.7 mg/dL (ref 8.9–10.3)
Chloride: 105 mmol/L (ref 98–111)
Creatinine, Ser: 0.92 mg/dL (ref 0.44–1.00)
GFR calc Af Amer: >60 mL/min (ref >60)
GFR calc non Af Amer: >60 mL/min (ref >60)
Glucose, Bld: 94 mg/dL (ref 70–99)
Potassium: 3.9 mmol/L (ref 3.5–5.1)
Sodium: 139 mmol/L (ref 135–145)

## 2019-01-24 LAB — CBC
HCT: 35.3 % — ABNORMAL LOW (ref 36.0–46.0)
Hemoglobin: 11.2 g/dL — ABNORMAL LOW (ref 12.0–15.0)
MCH: 23.5 pg — ABNORMAL LOW (ref 26.0–34.0)
MCHC: 31.7 g/dL (ref 30.0–36.0)
MCV: 74 fL — ABNORMAL LOW (ref 80.0–100.0)
Platelets: 252 10*3/uL (ref 150–400)
RBC: 4.77 MIL/uL (ref 3.87–5.11)
RDW: 19.5 % — ABNORMAL HIGH (ref 11.5–15.5)
WBC: 4.8 10*3/uL (ref 4.0–10.5)
nRBC: 0 % (ref 0.0–0.2)

## 2019-01-24 LAB — VITAMIN B12: Vitamin B-12: 441 pg/mL (ref 180–914)

## 2019-01-24 LAB — IRON AND TIBC
Iron: 28 ug/dL (ref 28–170)
Saturation Ratios: 7 % — ABNORMAL LOW (ref 10.4–31.8)
TIBC: 417 ug/dL (ref 250–450)
UIBC: 389 ug/dL

## 2019-01-24 LAB — TYPE AND SCREEN
ABO/RH(D): B POS
Antibody Screen: NEGATIVE

## 2019-01-24 LAB — FERRITIN: Ferritin: 10 ng/mL — ABNORMAL LOW (ref 11–307)

## 2019-01-24 LAB — ABO/RH: ABO/RH(D): B POS

## 2019-01-24 LAB — FOLATE: Folate: 6.6 ng/mL (ref >5.9)

## 2019-01-24 SURGERY — COLECTOMY, PARTIAL
Anesthesia: General | Site: Abdomen

## 2019-01-24 MED ORDER — ONDANSETRON HCL 4 MG/2ML IJ SOLN: 4.0000 mg | Freq: Four times a day (QID) | INTRAMUSCULAR | Status: DC | PRN

## 2019-01-24 MED ORDER — SUGAMMADEX SODIUM 200 MG/2ML IV SOLN
INTRAVENOUS | Status: DC | PRN
  Administered 2019-01-24: 200 mg via INTRAVENOUS

## 2019-01-24 MED ORDER — METOPROLOL TARTRATE 5 MG/5ML IV SOLN
5.0000 mg | Freq: Four times a day (QID) | INTRAVENOUS | Status: DC
  Administered 2019-01-24 – 2019-01-25 (×4): 5 mg via INTRAVENOUS
  Filled 2019-01-24 (×4): qty 5

## 2019-01-24 MED ORDER — ONDANSETRON HCL 4 MG/2ML IJ SOLN
INTRAMUSCULAR | Status: DC | PRN
  Administered 2019-01-24: 4 mg via INTRAVENOUS

## 2019-01-24 MED ORDER — HEMOSTATIC AGENTS (NO CHARGE) OPTIME
TOPICAL | Status: DC | PRN
  Administered 2019-01-24: 1

## 2019-01-24 MED ORDER — SUCCINYLCHOLINE CHLORIDE 20 MG/ML IJ SOLN
INTRAMUSCULAR | Status: DC | PRN
  Administered 2019-01-24: 140 mg via INTRAVENOUS

## 2019-01-24 MED ORDER — DEXAMETHASONE SODIUM PHOSPHATE 10 MG/ML IJ SOLN
INTRAMUSCULAR | Status: DC | PRN
  Administered 2019-01-24: 10 mg via INTRAVENOUS

## 2019-01-24 MED ORDER — OXYCODONE HCL 5 MG PO TABS: 5.0000 mg | ORAL_TABLET | Freq: Once | ORAL | Status: DC | PRN

## 2019-01-24 MED ORDER — PROPOFOL 10 MG/ML IV BOLUS
INTRAVENOUS | Status: AC
  Filled 2019-01-24: qty 20

## 2019-01-24 MED ORDER — HYDROMORPHONE HCL 1 MG/ML IJ SOLN
INTRAMUSCULAR | Status: AC
  Filled 2019-01-24: qty 1

## 2019-01-24 MED ORDER — MIDAZOLAM HCL 5 MG/5ML IJ SOLN
INTRAMUSCULAR | Status: DC | PRN
  Administered 2019-01-24: 2 mg via INTRAVENOUS

## 2019-01-24 MED ORDER — LACTATED RINGERS IV SOLN
INTRAVENOUS | Status: DC
  Administered 2019-01-24: 11:00:00 via INTRAVENOUS

## 2019-01-24 MED ORDER — PROMETHAZINE HCL 25 MG/ML IJ SOLN: 6.2500 mg | INTRAMUSCULAR | Status: DC | PRN

## 2019-01-24 MED ORDER — NALOXONE HCL 0.4 MG/ML IJ SOLN: 0.4000 mg | INTRAMUSCULAR | Status: DC | PRN

## 2019-01-24 MED ORDER — MEPERIDINE HCL 25 MG/ML IJ SOLN: 6.2500 mg | INTRAMUSCULAR | Status: DC | PRN

## 2019-01-24 MED ORDER — MIDAZOLAM HCL 2 MG/2ML IJ SOLN
INTRAMUSCULAR | Status: AC
  Filled 2019-01-24: qty 2

## 2019-01-24 MED ORDER — ROCURONIUM BROMIDE 100 MG/10ML IV SOLN
INTRAVENOUS | Status: DC | PRN
  Administered 2019-01-24: 60 mg via INTRAVENOUS

## 2019-01-24 MED ORDER — FENTANYL CITRATE (PF) 250 MCG/5ML IJ SOLN
INTRAMUSCULAR | Status: AC
  Filled 2019-01-24: qty 5

## 2019-01-24 MED ORDER — LIDOCAINE 2% (20 MG/ML) 5 ML SYRINGE
INTRAMUSCULAR | Status: DC | PRN
  Administered 2019-01-24: 80 mg via INTRAVENOUS

## 2019-01-24 MED ORDER — PROPOFOL 10 MG/ML IV BOLUS
INTRAVENOUS | Status: DC | PRN
  Administered 2019-01-24: 150 mg via INTRAVENOUS

## 2019-01-24 MED ORDER — FENTANYL CITRATE (PF) 250 MCG/5ML IJ SOLN
INTRAMUSCULAR | Status: DC | PRN
  Administered 2019-01-24 (×2): 50 ug via INTRAVENOUS
  Administered 2019-01-24: 100 ug via INTRAVENOUS
  Administered 2019-01-24: 50 ug via INTRAVENOUS

## 2019-01-24 MED ORDER — MORPHINE SULFATE 2 MG/ML IV SOLN
INTRAVENOUS | Status: DC
  Administered 2019-01-24: 18:00:00 via INTRAVENOUS
  Administered 2019-01-24: 13.5 mg via INTRAVENOUS
  Administered 2019-01-25: 15 mg via INTRAVENOUS
  Administered 2019-01-25: 9 mg via INTRAVENOUS
  Administered 2019-01-25: 12 mg via INTRAVENOUS
  Administered 2019-01-25: 11:00:00 via INTRAVENOUS
  Administered 2019-01-26: 18 mg via INTRAVENOUS
  Administered 2019-01-26: 3 mg via INTRAVENOUS
  Administered 2019-01-27: 0 mg via INTRAVENOUS
  Administered 2019-01-27: 6 mg via INTRAVENOUS
  Administered 2019-01-27 (×2): 0 mg via INTRAVENOUS
  Administered 2019-01-27: 1.5 mg via INTRAVENOUS
  Administered 2019-01-27: 0 mg via INTRAVENOUS
  Administered 2019-01-28: 1 mg via INTRAVENOUS
  Administered 2019-01-28: 0 mg via INTRAVENOUS
  Administered 2019-01-28: 1.5 mg via INTRAVENOUS
  Administered 2019-01-28: 3 mg via INTRAVENOUS
  Administered 2019-01-28: 4.2 mg via INTRAVENOUS
  Administered 2019-01-28: 7.5 mg via INTRAVENOUS
  Administered 2019-01-29: 0 mg via INTRAVENOUS
  Administered 2019-01-29: 9 mg via INTRAVENOUS
  Administered 2019-01-29: 7.5 mg via INTRAVENOUS
  Filled 2019-01-24 (×4): qty 30

## 2019-01-24 MED ORDER — DIPHENHYDRAMINE HCL 12.5 MG/5ML PO ELIX: 12.5000 mg | ORAL_SOLUTION | Freq: Four times a day (QID) | ORAL | Status: DC | PRN

## 2019-01-24 MED ORDER — OXYCODONE HCL 5 MG/5ML PO SOLN: 5.0000 mg | Freq: Once | ORAL | Status: DC | PRN

## 2019-01-24 MED ORDER — SODIUM CHLORIDE 0.9% FLUSH: 9.0000 mL | INTRAVENOUS | Status: DC | PRN

## 2019-01-24 MED ORDER — 0.9 % SODIUM CHLORIDE (POUR BTL) OPTIME
TOPICAL | Status: DC | PRN
  Administered 2019-01-24 (×2): 1000 mL

## 2019-01-24 MED ORDER — HYDROMORPHONE HCL 1 MG/ML IJ SOLN
0.2500 mg | INTRAMUSCULAR | Status: DC | PRN
  Administered 2019-01-24 (×3): 0.5 mg via INTRAVENOUS

## 2019-01-24 MED ORDER — DIPHENHYDRAMINE HCL 50 MG/ML IJ SOLN: 12.5000 mg | Freq: Four times a day (QID) | INTRAMUSCULAR | Status: DC | PRN

## 2019-01-24 SURGICAL SUPPLY — 47 items
BLADE CLIPPER SURG (BLADE) IMPLANT
BRR ADH 5X3 SEPRAFILM 6 SHT (MISCELLANEOUS) ×1
CANISTER SUCT 3000ML PPV (MISCELLANEOUS) ×3 IMPLANT
COVER SURGICAL LIGHT HANDLE (MISCELLANEOUS) ×6 IMPLANT
COVER WAND RF STERILE (DRAPES) ×3 IMPLANT
DRAPE WARM FLUID 44X44 (DRAPES) ×2 IMPLANT
DRSG OPSITE POSTOP 4X10 (GAUZE/BANDAGES/DRESSINGS) ×2 IMPLANT
DRSG OPSITE POSTOP 4X8 (GAUZE/BANDAGES/DRESSINGS) IMPLANT
ELECT CAUTERY BLADE 6.4 (BLADE) ×6 IMPLANT
ELECT REM PT RETURN 9FT ADLT (ELECTROSURGICAL) ×3
ELECTRODE REM PT RTRN 9FT ADLT (ELECTROSURGICAL) ×1 IMPLANT
GLOVE BIO SURGEON STRL SZ 6 (GLOVE) ×3 IMPLANT
GLOVE INDICATOR 6.5 STRL GRN (GLOVE) ×3 IMPLANT
GOWN STRL REUS W/ TWL LRG LVL3 (GOWN DISPOSABLE) ×6 IMPLANT
GOWN STRL REUS W/TWL LRG LVL3 (GOWN DISPOSABLE) ×18
HEMOSTAT SNOW SURGICEL 2X4 (HEMOSTASIS) ×2 IMPLANT
KIT OSTOMY DRAINABLE 2.75 STR (WOUND CARE) ×2 IMPLANT
KIT TURNOVER KIT B (KITS) ×3 IMPLANT
LIGASURE IMPACT 36 18CM CVD LR (INSTRUMENTS) ×2 IMPLANT
MARKER SKIN DUAL TIP RULER LAB (MISCELLANEOUS) ×2 IMPLANT
NS IRRIG 1000ML POUR BTL (IV SOLUTION) ×6 IMPLANT
PACK COLON (CUSTOM PROCEDURE TRAY) ×3 IMPLANT
PAD ARMBOARD 7.5X6 YLW CONV (MISCELLANEOUS) ×3 IMPLANT
PENCIL BUTTON HOLSTER BLD 10FT (ELECTRODE) ×3 IMPLANT
PENCIL SMOKE EVACUATOR (MISCELLANEOUS) ×2 IMPLANT
RELOAD PROXIMATE 75MM BLUE (ENDOMECHANICALS) ×3 IMPLANT
RELOAD STAPLE 75 3.8 BLU REG (ENDOMECHANICALS) IMPLANT
RETRACTOR WND ALEXIS 25 LRG (MISCELLANEOUS) IMPLANT
RTRCTR WOUND ALEXIS 25CM LRG (MISCELLANEOUS) ×3
SEPRAFILM PROCEDURAL PACK 3X5 (MISCELLANEOUS) ×2 IMPLANT
SPONGE LAP 18X18 RF (DISPOSABLE) IMPLANT
STAPLER PROXIMATE 75MM BLUE (STAPLE) ×2 IMPLANT
STAPLER VISISTAT 35W (STAPLE) ×3 IMPLANT
SURGILUBE 2OZ TUBE FLIPTOP (MISCELLANEOUS) IMPLANT
SUT PDS AB 1 TP1 96 (SUTURE) ×6 IMPLANT
SUT PROLENE 2 0 CT2 30 (SUTURE) IMPLANT
SUT PROLENE 2 0 KS (SUTURE) IMPLANT
SUT PROLENE 2 0 SH 30 (SUTURE) ×2 IMPLANT
SUT SILK 2 0 SH CR/8 (SUTURE) ×3 IMPLANT
SUT SILK 2 0 TIES 10X30 (SUTURE) ×3 IMPLANT
SUT SILK 3 0 SH CR/8 (SUTURE) ×3 IMPLANT
SUT SILK 3 0 TIES 10X30 (SUTURE) ×3 IMPLANT
SUT VIC AB 3-0 SH 18 (SUTURE) ×2 IMPLANT
TRAY FOLEY MTR SLVR 16FR STAT (SET/KITS/TRAYS/PACK) IMPLANT
TRAY PROCTOSCOPIC FIBER OPTIC (SET/KITS/TRAYS/PACK) IMPLANT
TUBE CONNECTING 12'X1/4 (SUCTIONS) ×2
TUBE CONNECTING 12X1/4 (SUCTIONS) ×4 IMPLANT

## 2019-01-24 NOTE — Progress Notes (Signed)
Paged K Schoor 3rd time in regards to high BP

## 2019-01-24 NOTE — Anesthesia Postprocedure Evaluation (Signed)
Anesthesia Post Note  Patient: Casey Black  Procedure(s) Performed: PARTIAL COLECTOMY (N/A Abdomen) End Loop Colostomy (N/A Abdomen)     Patient location during evaluation: PACU Anesthesia Type: General Level of consciousness: awake and alert Pain management: pain level controlled Vital Signs Assessment: post-procedure vital signs reviewed and stable Respiratory status: spontaneous breathing, nonlabored ventilation and respiratory function stable Cardiovascular status: blood pressure returned to baseline and stable Postop Assessment: no apparent nausea or vomiting Anesthetic complications: no    Last Vitals:  Vitals:   01/24/19 1447 01/24/19 1509  BP: (!) 185/68 (!) 185/55  Pulse: 81 80  Resp: 18   Temp: (!) 36.1 C 36.5 C  SpO2: 96% 99%    Last Pain:  Vitals:   01/24/19 1509  TempSrc: Oral  PainSc: [place]

## 2019-01-24 NOTE — Progress Notes (Signed)
Subjective/Chief Complaint: Denies pain, feeling better with NG   Objective: Vital signs in last 24 hours: Temp:  [98.5 F (36.9 C)-98.7 F (37.1 C)] 98.7 F (37.1 C) (10/21 0900) Pulse Rate:  [80-98] 96 (10/21 0643) Resp:  [20] 20 (10/21 0412) BP: (150-201)/(67-88) 180/71 (10/21 0900) SpO2:  [94 %-98 %] 95 % (10/21 0900)    Intake/Output from previous day: 10/20 0701 - 10/21 0700 In: 2708.7 [I.V.:1628.7; NG/GT:80; IV Piggyback:1000] Out: 900 [Emesis/NG output:900] Intake/Output this shift: No intake/output data recorded.  General appearance: alert and cooperative Resp: unlabored Cardio: regular rate and rhythm GI: soft, distended, mildly diffusely tender Skin: Skin color, texture, turgor normal. No rashes or lesions Neurologic: Grossly normal  Lab Results:  Recent Labs    01/23/19 0307 01/24/19 0144  WBC 11.0* 4.8  HGB 12.7 11.2*  HCT 40.2 35.3*  PLT 299 252   BMET Recent Labs    01/23/19 0307 01/24/19 0144  NA 135 139  K 3.5 3.9  CL 101 105  CO2 20* 24  GLUCOSE 141* 94  BUN 13 19  CREATININE 0.92 0.92  CALCIUM 10.9* 9.7   PT/INR No results for input(s): LABPROT, INR in the last 72 hours. ABG No results for input(s): PHART, HCO3 in the last 72 hours.  Invalid input(s): PCO2, PO2  Studies/Results: Ct Abdomen Pelvis W Contrast  Result Date: 01/23/2019 CLINICAL DATA:  Acute abdominal pain with generalized small bowel obstruction. EXAM: CT ABDOMEN AND PELVIS WITH CONTRAST TECHNIQUE: Multidetector CT imaging of the abdomen and pelvis was performed using the standard protocol following bolus administration of intravenous contrast. CONTRAST:  100 cc OMNIPAQUE IOHEXOL 300 MG/ML  SOLN COMPARISON:  None. FINDINGS: Lower chest: Right coronary calcification. Mild right lower lobe scarring along endplate osteophytes. Hepatobiliary: Multiple partially calcified liver lesions consistent with metastatic disease given the constellation of findings, up to 25 mm  in the subcapsular right liver.No evidence of biliary obstruction or stone. Pancreas: Unremarkable. Spleen: Unremarkable. Adrenals/Urinary Tract: Negative adrenals. No hydronephrosis or stone. Unremarkable bladder. Stomach/Bowel: Irregular mass at the mid transverse colon with proximal colonic and small bowel dilatation. There is lobulation along the serosal surface. No appendicitis. Vascular/Lymphatic: Mildly enlarged transverse mesocolon lymph node near the mass, 8 mm in diameter. No acute vascular finding Reproductive:Small uterine fibroids. Other: Tumor appears to extend through the serosa but there is no ascites or evident peritoneal nodularity. Musculoskeletal: Degenerative changes without acute or aggressive finding IMPRESSION: Obstructing mid transverse colonic mass with mild regional adenopathy and hepatic metastatic disease. The mass likely extends through the serosa; no ascites or peritoneal nodularity. Electronically Signed   By: Monte Fantasia M.D.   On: 01/23/2019 07:48   Dg Abdomen Acute W/chest  Result Date: 01/23/2019 CLINICAL DATA:  Constipation. Cough. Nausea and vomiting. EXAM: DG ABDOMEN ACUTE W/ 1V CHEST COMPARISON:  None. FINDINGS: Lungs are clear. Normal cardiomediastinal contours. No focal airspace disease. No pleural effusion. Dilated small bowel with air-fluid levels. Minimal stool in the rectosigmoid colon, otherwise no significant formed stool. No evidence of free air. No radiopaque calculi or abnormal soft tissue calcifications. No osseous abnormalities. IMPRESSION: 1. Bowel-gas pattern consistent with small bowel obstruction. No free air. 2. No acute chest findings. Electronically Signed   By: Keith Rake M.D.   On: 01/23/2019 05:51   Dg Abd Portable 1 View  Result Date: 01/23/2019 CLINICAL DATA:  Nasogastric tube placement. EXAM: PORTABLE ABDOMEN - 1 VIEW COMPARISON:  Same day. FINDINGS: Nasogastric tube tip is seen in proximal stomach. Residual  intravenous contrast is  seen within the nondilated renal collecting systems. Mildly dilated small bowel loops are noted concerning for distal small bowel obstruction. No large bowel dilatation is noted. IMPRESSION: Distal tip of nasogastric tube seen in proximal stomach. Mildly dilated small bowel loops are noted concerning for distal small bowel obstruction. Electronically Signed   By: Marijo Conception M.D.   On: 01/23/2019 08:35    Anti-infectives: Anti-infectives (From admission, onward)   Start     Dose/Rate Route Frequency Ordered Stop   01/24/19 1030  cefoTEtan (CEFOTAN) 1 g in sodium chloride 0.9 % 100 mL IVPB     1 g 200 mL/hr over 30 Minutes Intravenous On call to O.R. 01/23/19 1344 01/25/19 0559      Assessment/Plan: Hypertension- new dx on admission Obstructing transverse colon mass with likely liver metastases- OR today, planning transverse colectomy with end colostomy given malnutrition (50lb weight loss) and chronic obstruction. Will attempt to biopsy any liver lesion that can safely be accessed. I discussed the plan with the patient and went over risks of bleeding, infection, pain, scarring, injury to intraabdominal structures, post-op ileus or obstruction, wound problems, risk of cardiac, pulmonary, vascular and neurologic complications including DVT/PE, MI/arrhythmia, pneumonia, stroke, death. Her risk of morbidity is increased due to previously undiagnosed and uncontrolled hypertension (unknown if she has other medical comorbidities as she has not had regular medical care) as well as the urgent nature of the surgery in setting of obstruction. Questions were welcomed and answered to the patient's satisfaction.    LOS: 1 day    Clovis Riley 01/24/2019

## 2019-01-24 NOTE — Op Note (Addendum)
Operative Note  Casey Black  [id number]  [id number]  01/24/2019   Surgeon: Vikki Ports A ConnorMD  Assistant: [name]  Procedure performed: Transverse colectomy with end colostomy, liver biopsy  Procedure classification: URGENT/EMERGENT  Preop diagnosis: Obstructing, metastatic transverse colon mass Post-op diagnosis/intraop findings: Same  Specimens: Transverse colon (suture marks distal margin), liver biopsy (left lobe) Retained items: None EBL: Minimal cc Complications: none  Description of procedure: After obtaining informed consent the patient was taken to the operating room and placed supine on operating room table wheregeneral endotracheal anesthesia was initiated, preoperative antibiotics were administered, SCDs applied, and a formal timeout was performed.  Foley catheter was inserted.  The patient's abdomen was prepped and draped in the usual sterile fashion.  A vertical upper midline incision was created and the [place] wound protector was inserted.  Omental adhesions at and below the level of the umbilicus were lysed with cautery.  The bowel was eviscerated and the transverse colon mass was immediately apparent.  The lesser sac was entered using combination of blunt and cautery dissection in the transverse colon mesentery identified.  The middle colic artery was palpable.  A point was chosen just proximal to the splenic flexure, about 10 cm distal to the palpable mass and a window was made in the mesentery following which the blue load linear cutting stapler was used to divide the colon.  The remnant was marked with a single 2-0 Prolene suture.  We then divided the colon proximal to the mass just distal to the hepatic flexure, at least 10 cm proximal to the palpable mass began using the blue load and recurring stapler.  The mesentery was divided using the LigaSure device, the middle colic artery was additionally suture-ligated.  The specimen was marked with a suture  on the distal staple line and handed off for pathology.   We then turned to the liver, on palpation there are at least 4 masses in the right superior posterior liver, which were not safely accessible from this vantage point for biopsy.  There is an additional 1 cm lesion on the superior surface of the left lobe which was excised with cautery and handed off for pathological examination.  Hemostasis was ensured in this area with cautery and Surgicel snow.  Then turned to create the colostomy site, site in the right upper quadrant above the abdominal crease and within the rectus muscle was chosen to better accommodate the end transverse colostomy.  The colon was then brought out through this and secured with a Babcock, ensuring no twisting of the colon mesentery.  The abdomen was then irrigated, inspected once more for hemostasis.  The small bowel was run from ligament of Treitz to the terminal ileum with no other areas of obstruction or mass identified.  There is no palpable mass in the right or left remaining colon.  The NG tube is palpable in the stomach.  There is no other visible or palpable evidence of metastases aside from the noted liver lesions.  Seprafilm was placed over the small bowel and around the colostomy.  The midline incision was then closed with running looped #1 PDS starting at either end and tying centrally.  The soft tissues were irrigated, hemostasis ensured and the incision closed loosely with staples.  The colostomy in the right upper quadrant was then matured in a standard fashion using 4-0 Vicryl sutures.  The stoma was digitized and clearly patent below the fascia, appears well perfused at the end of the case.  Ostomy appliance and incision dressings were then applied. The patient was then awakened, extubated and taken to PACU in stable condition.   All counts were correct at the completion of the case.

## 2019-01-24 NOTE — Progress Notes (Signed)
PROGRESS NOTE    ESTEBAN SALERA  [id number] DOB: 01/27/1961 DOA: 01/23/2019 PCP: Patient, No Pcp Per    Brief Narrative:  58 y.o. female with no known pastmedical history significant(does not see doctors) presenting with abdominal pain.  She reports that her stomach was giving her problems.  When she would eat, it was very painful - couldn't lay down or sit up.  She felt constipated and took 2 Senokot.  About 0100, she developed n/v and was unable to stop and so her daughter brought her in.  The symptoms started maybe 2 months ago but was intermittent.  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  She has had marked unintentional weight loss in the last few months.   ED Course: SBO on Xray. Getting CT scan. Dr. Tomasita Morrow has seen, surgery will follow. NGT in place. IV hydralazine for severe HTN, given Norvasc earlier.   Assessment & Plan:   Active Problems:   SBO (small bowel obstruction) (HCC)   Hypertension   Colonic mass   Tobacco dependence   Hyperglycemia    SBO -Patient who has not received medical care in >10 years (but has insurance) presenting with progressive abdominal pain, n/v, anorexia, and unintentional weight loss -Initial xray suggesting of SBO -Pt had been continued with NPO and NG to low wall suction -General Surgery and GI were consulted, see below  Colonic mass -Mass in the transverse colon seen on CT abd -GI and General Surgery consulted. 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   -This was discussed with  the patient and should be reviewed on an ongoing basis.   -Continued on nicotine patch  DVT prophylaxis: Heparin subq Code Status: Full Family Communication: Pt in room, family at bedside Disposition Plan: Uncertain at this time  Consultants:   GI  General Surgery  Procedures:   Transverse colectomywith end colostomy and liver biopsy 10/21  Antimicrobials: Anti-infectives (From admission, onward)   Start     Dose/Rate Route Frequency Ordered Stop   01/24/19 1030  cefoTEtan (CEFOTAN) 1 g in sodium chloride 0.9 % 100 mL IVPB     1 g 200 mL/hr over 30 Minutes Intravenous On call to O.R. 01/23/19 1344 01/24/19 1146       Subjective: Feeling nervous this AM prior to surgery  Objective: Vitals:   01/24/19 1413 01/24/19 1430 01/24/19 1447 01/24/19 1509  BP: (!) 187/77 (!) 195/67 (!) 185/68 (!) 185/55  Pulse: 78 77 81 80  Resp: (!) 22 20 18    Temp:   (!) 97 F (36.1 C) 97.7 F (36.5 C)  TempSrc:    Oral  SpO2: 92% 96% 96% 99%  Weight:      Height:        Intake/Output Summary (Last 24 hours) at 01/24/2019 1638 Last data filed at 01/24/2019 1344 Gross per 24 hour  Intake 3363.68 ml  Output 1000 ml  Net 2363.68 ml   Filed Weights   01/24/19 1036  Weight: 92.1 kg    Examination:  General exam: Appears calm and comfortable  Respiratory system: Clear to auscultation.  Respiratory effort normal. Cardiovascular system: S1 & S2 heard, RRR Gastrointestinal system: Abdomen is nondistended, soft and nontender. No organomegaly or masses felt. Normal bowel sounds heard. Central nervous system: Alert and oriented. No focal neurological deficits. Extremities: Symmetric 5 x 5 power. Skin: No rashes, lesions  Psychiatry: Judgement and insight appear normal. Seems anxious  Data Reviewed: I have personally reviewed following labs and imaging studies  CBC: Recent Labs  Lab 01/23/19 0307 01/24/19 0144  WBC 11.0* 4.8  HGB 12.7 11.2*  HCT 40.2 35.3*  MCV 73.2* 74.0*   PLT 299 [id number]   Basic Metabolic Panel: Recent Labs  Lab 01/23/19 0307 01/24/19 0144  NA 135 139  K 3.5 3.9  CL 101 105  CO2 20* 24  GLUCOSE 141* 94  BUN 13 19  CREATININE 0.92 0.92  CALCIUM 10.9* 9.7   GFR: Estimated Creatinine Clearance: 77.7 mL/min (by C-G formula based on SCr of 0.92 mg/dL). Liver Function Tests: Recent Labs  Lab 01/23/19 0307  AST 17  ALT 11  ALKPHOS 90  BILITOT 0.4  PROT 8.6*  ALBUMIN 4.2   Recent Labs  Lab 01/23/19 0307  LIPASE 17   No results for input(s): AMMONIA in the last 168 hours. Coagulation Profile: No results for input(s): INR, PROTIME in the last 168 hours. Cardiac Enzymes: No results for input(s): CKTOTAL, CKMB, CKMBINDEX, TROPONINI in the last 168 hours. BNP (last 3 results) No results for input(s): PROBNP in the last 8760 hours. HbA1C: Recent Labs    01/23/19 0939  HGBA1C 5.2   CBG: No results for input(s): GLUCAP in the last 168 hours. Lipid Profile: No results for input(s): CHOL, HDL, LDLCALC, TRIG, CHOLHDL, LDLDIRECT in the last 72 hours. Thyroid Function Tests: Recent Labs    01/23/19 0939  TSH 1.141   Anemia Panel: Recent Labs    01/24/19 0144  VITAMINB12 441  FOLATE 6.6  FERRITIN 10*  TIBC 417  IRON 28  RETICCTPCT 1.2   Sepsis Labs: No results for input(s): PROCALCITON, LATICACIDVEN in the last 168 hours.  Recent Results (from the past 240 hour(s))  SARS CORONAVIRUS 2 (TAT 6-24 HRS) Nasopharyngeal Nasopharyngeal Swab     Status: None   Collection Time: 01/23/19  7:52 AM   Specimen: Nasopharyngeal Swab  Result Value Ref Range Status   SARS Coronavirus 2 NEGATIVE NEGATIVE Final    Comment: (NOTE) SARS-CoV-2 target nucleic acids are NOT DETECTED. The SARS-CoV-2 RNA is generally detectable in upper and lower respiratory specimens during the acute phase of infection. Negative results do not preclude SARS-CoV-2 infection, do not rule out co-infections with other pathogens, and should not be used as  the sole basis for treatment or other patient management decisions. Negative results must be combined with clinical observations, patient history, and epidemiological information. The expected result is Negative. Fact Sheet for Patients: SugarRoll.be Fact Sheet for Healthcare Providers: https://www.woods-mathews.com/ This test is not yet approved or cleared by the [place] FDA and  has been authorized for detection and/or diagnosis of SARS-CoV-2 by FDA under an Emergency Use Authorization (EUA). This EUA will remain  in effect (meaning this test can be used) for the duration of the COVID-19 declaration under Section 56 4(b)(1) of the Act, 21 U.S.C. section 360bbb-3(b)(1), unless the authorization is terminated or revoked sooner. Performed at [facility], [place] [address], [place], [place] [address]   Surgical pcr screen     Status: None   Collection Time: 01/23/19  6:51 PM   Specimen: Nasal  Mucosa; Nasal Swab  Result Value Ref Range Status   MRSA, PCR NEGATIVE NEGATIVE Final   Staphylococcus aureus NEGATIVE NEGATIVE Final    Comment: (NOTE) The Xpert SA Assay (FDA approved for NASAL specimens in patients 10 years of age and older), is one component of a comprehensive surveillance program. It is not intended to diagnose infection nor to guide or monitor treatment. Performed at [facility], [place] [address] [place], [place] [address]      Radiology Studies: Ct Abdomen Pelvis W Contrast  Result Date: 01/23/2019 CLINICAL DATA:  Acute abdominal pain with generalized small bowel obstruction. EXAM: CT ABDOMEN AND PELVIS WITH CONTRAST TECHNIQUE: Multidetector CT imaging of the abdomen and pelvis was performed using the standard protocol following bolus administration of intravenous contrast. CONTRAST:  100 cc OMNIPAQUE IOHEXOL 300 MG/ML  SOLN COMPARISON:  None. FINDINGS: Lower chest: Right coronary calcification. Mild right lower  lobe scarring along endplate osteophytes. Hepatobiliary: Multiple partially calcified liver lesions consistent with metastatic disease given the constellation of findings, up to 25 mm in the subcapsular right liver.No evidence of biliary obstruction or stone. Pancreas: Unremarkable. Spleen: Unremarkable. Adrenals/Urinary Tract: Negative adrenals. No hydronephrosis or stone. Unremarkable bladder. Stomach/Bowel: Irregular mass at the mid transverse colon with proximal colonic and small bowel dilatation. There is lobulation along the serosal surface. No appendicitis. Vascular/Lymphatic: Mildly enlarged transverse mesocolon lymph node near the mass, 8 mm in diameter. No acute vascular finding Reproductive:Small uterine fibroids. Other: Tumor appears to extend through the serosa but there is no ascites or evident peritoneal nodularity. Musculoskeletal: Degenerative changes without acute or aggressive finding IMPRESSION: Obstructing mid transverse colonic mass with mild regional adenopathy and hepatic metastatic disease. The mass likely extends through the serosa; no ascites or peritoneal nodularity. Electronically Signed   By: Monte Fantasia M.D.   On: 01/23/2019 07:48   Dg Abdomen Acute W/chest  Result Date: 01/23/2019 CLINICAL DATA:  Constipation. Cough. Nausea and vomiting. EXAM: DG ABDOMEN ACUTE W/ 1V CHEST COMPARISON:  None. FINDINGS: Lungs are clear. Normal cardiomediastinal contours. No focal airspace disease. No pleural effusion. Dilated small bowel with air-fluid levels. Minimal stool in the rectosigmoid colon, otherwise no significant formed stool. No evidence of free air. No radiopaque calculi or abnormal soft tissue calcifications. No osseous abnormalities. IMPRESSION: 1. Bowel-gas pattern consistent with small bowel obstruction. No free air. 2. No acute chest findings. Electronically Signed   By: Keith Rake M.D.   On: 01/23/2019 05:51   Dg Abd Portable 1 View  Result Date: 01/23/2019  CLINICAL DATA:  Nasogastric tube placement. EXAM: PORTABLE ABDOMEN - 1 VIEW COMPARISON:  Same day. FINDINGS: Nasogastric tube tip is seen in proximal stomach. Residual intravenous contrast is seen within the nondilated renal collecting systems. Mildly dilated small bowel loops are noted concerning for distal small bowel obstruction. No large bowel dilatation is noted. IMPRESSION: Distal tip of nasogastric tube seen in proximal stomach. Mildly dilated small bowel loops are noted concerning for distal small bowel obstruction. Electronically Signed   By: Marijo Conception M.D.   On: 01/23/2019 08:35    Scheduled Meds: . heparin  5,000 Units Subcutaneous Q8H  . HYDROmorphone      . HYDROmorphone      . metoprolol tartrate  5 mg Intravenous Q6H  . morphine   Intravenous Q4H  . nicotine  14 mg Transdermal Daily   Continuous Infusions: . lactated ringers 125 mL/hr at 01/24/19 0520     LOS: 1 day   Marylu Lund,  MD [facility] Pager On Amion  If 7PM-7AM, please contact night-coverage 01/24/2019, 4:38 PM

## 2019-01-24 NOTE — Progress Notes (Addendum)
Paged K Schorr again to ensure text page went through  Patient blood pressure 209/67 HR 98 paged Dr Hilbert Bible

## 2019-01-24 NOTE — Anesthesia Procedure Notes (Signed)
Procedure Name: Intubation Date/Time: 01/24/2019 11:36 AM Performed by: Neldon Newport, CRNA Pre-anesthesia Checklist: Timeout performed, Patient being monitored, Suction available, Emergency Drugs available and Patient identified Patient Re-evaluated:Patient Re-evaluated prior to induction Oxygen Delivery Method: Circle system utilized Preoxygenation: Pre-oxygenation with 100% oxygen Induction Type: IV induction and Rapid sequence Laryngoscope Size: Mac and 3 Grade View: Grade I Tube type: Oral Tube size: 7.0 mm Number of attempts: 1 Placement Confirmation: breath sounds checked- equal and bilateral,  positive ETCO2 and ETT inserted through vocal cords under direct vision Secured at: 21 cm Tube secured with: Tape Dental Injury: Teeth and Oropharynx as per pre-operative assessment

## 2019-01-24 NOTE — Anesthesia Preprocedure Evaluation (Signed)
Anesthesia Evaluation  Patient identified by MRN, date of birth, ID band Patient awake    Reviewed: Allergy & Precautions, NPO status , Patient's Chart, lab work & pertinent test results  Airway Mallampati: II  TM Distance: >3 FB Neck ROM: Full    Dental no notable dental hx.    Pulmonary neg pulmonary ROS, Current Smoker and Patient abstained from smoking.,    Pulmonary exam normal breath sounds clear to auscultation       Cardiovascular hypertension, negative cardio ROS Normal cardiovascular exam Rhythm:Regular Rate:Normal     Neuro/Psych negative neurological ROS  negative psych ROS   GI/Hepatic negative GI ROS, Neg liver ROS,   Endo/Other  negative endocrine ROS  Renal/GU negative Renal ROS  negative genitourinary   Musculoskeletal negative musculoskeletal ROS (+)   Abdominal (+) + obese,   Peds negative pediatric ROS (+)  Hematology negative hematology ROS (+)   Anesthesia Other Findings Colon mass with mets  Reproductive/Obstetrics negative OB ROS                             Anesthesia Physical Anesthesia Plan  ASA: III  Anesthesia Plan: General   Post-op Pain Management:    Induction: Intravenous  PONV Risk Score and Plan: 2 and Ondansetron, Midazolam and Treatment may vary due to age or medical condition  Airway Management Planned: Oral ETT  Additional Equipment:   Intra-op Plan:   Post-operative Plan: Extubation in OR  Informed Consent: I have reviewed the patients History and Physical, chart, labs and discussed the procedure including the risks, benefits and alternatives for the proposed anesthesia with the patient or authorized representative who has indicated his/her understanding and acceptance.     Dental advisory given  Plan Discussed with: CRNA  Anesthesia Plan Comments:         Anesthesia Quick Evaluation

## 2019-01-24 NOTE — Transfer of Care (Signed)
Immediate Anesthesia Transfer of Care Note  Patient: Casey Black  Procedure(s) Performed: PARTIAL COLECTOMY (N/A Abdomen) End Loop Colostomy (N/A Abdomen)  Patient Location: PACU  Anesthesia Type:General  Level of Consciousness: awake, alert  and oriented  Airway & Oxygen Therapy: Patient Spontanous Breathing and Patient connected to nasal cannula oxygen  Post-op Assessment: Report given to RN, Post -op Vital signs reviewed and stable and Patient moving all extremities X 4  Post vital signs: Reviewed and stable  Last Vitals:  Vitals Value Taken Time  BP 192/74 01/24/19 1343  Temp    Pulse 77 01/24/19 1343  Resp 22 01/24/19 1343  SpO2 98 % 01/24/19 1343  Vitals shown include unvalidated device data.  Last Pain:  Vitals:   01/24/19 0900  TempSrc: Oral  PainSc:          Complications: No apparent anesthesia complications

## 2019-01-25 ENCOUNTER — Inpatient Hospital Stay (HOSPITAL_COMMUNITY): Payer: BC Managed Care – PPO

## 2019-01-25 ENCOUNTER — Encounter (HOSPITAL_COMMUNITY): Payer: Self-pay | Admitting: Surgery

## 2019-01-25 LAB — BASIC METABOLIC PANEL
Anion gap: 9 (ref 5–15)
BUN: 21 mg/dL — ABNORMAL HIGH (ref 6–20)
CO2: 25 mmol/L (ref 22–32)
Calcium: 8.8 mg/dL — ABNORMAL LOW (ref 8.9–10.3)
Chloride: 107 mmol/L (ref 98–111)
Creatinine, Ser: 0.83 mg/dL (ref 0.44–1.00)
GFR calc Af Amer: >60 mL/min (ref >60)
GFR calc non Af Amer: >60 mL/min (ref >60)
Glucose, Bld: 90 mg/dL (ref 70–99)
Potassium: 3.9 mmol/L (ref 3.5–5.1)
Sodium: 141 mmol/L (ref 135–145)

## 2019-01-25 LAB — CBC
HCT: 37.3 % (ref 36.0–46.0)
Hemoglobin: 11.3 g/dL — ABNORMAL LOW (ref 12.0–15.0)
MCH: 22.9 pg — ABNORMAL LOW (ref 26.0–34.0)
MCHC: 30.3 g/dL (ref 30.0–36.0)
MCV: 75.7 fL — ABNORMAL LOW (ref 80.0–100.0)
Platelets: 258 10*3/uL (ref 150–400)
RBC: 4.93 MIL/uL (ref 3.87–5.11)
RDW: 19.8 % — ABNORMAL HIGH (ref 11.5–15.5)
WBC: 4.9 10*3/uL (ref 4.0–10.5)
nRBC: 0 % (ref 0.0–0.2)

## 2019-01-25 MED ORDER — METOPROLOL TARTRATE 5 MG/5ML IV SOLN
10.0000 mg | Freq: Four times a day (QID) | INTRAVENOUS | Status: DC
  Administered 2019-01-25 – 2019-01-28 (×12): 10 mg via INTRAVENOUS
  Filled 2019-01-25 (×12): qty 10

## 2019-01-25 MED ORDER — ENALAPRILAT 1.25 MG/ML IV SOLN
0.6250 mg | Freq: Four times a day (QID) | INTRAVENOUS | Status: DC
  Administered 2019-01-25 – 2019-01-27 (×10): 0.625 mg via INTRAVENOUS
  Filled 2019-01-25 (×11): qty 0.5

## 2019-01-25 MED ORDER — CHLORHEXIDINE GLUCONATE CLOTH 2 % EX PADS
6.0000 | MEDICATED_PAD | Freq: Every day | CUTANEOUS | Status: DC
  Administered 2019-01-25 – 2019-01-27 (×3): 6 via TOPICAL

## 2019-01-25 NOTE — TOC Initial Note (Addendum)
Transition of Care [facility]) - Initial/Assessment Note    Patient Details  Name: Casey Black MRN: [id number] Date of Birth: [birth year]  Transition of [facility]) CM/SW Contact:    Marilu Favre, RN Phone Number: 01/25/2019, 1:27 PM  Clinical Narrative:                 Confirmed face sheet information with patient and daughter Casey Black  at bedside.  Patient does not have a PCP. Provided [facility] number. Also patient can call number on insurance card and be provided with a complete list of MD's in network and chose from that list. Casey Black plans to call [facility] to establish care.  Explained NCM will start working on arranging home health RN for patient, however, patient and Laurence Compton will be taught wound care and ostomy care prior to discharge. [place] nurse will enroll patient in ostomy supply program.   Both voiced understanding.  Cassie with [facility] cannot accept due to not being in network with insurance.   Tommi Rumps with [facility] checking to see if he can accept  Expected Discharge Plan: [place] Barriers to Discharge: Continued Medical Work up   Patient Goals and CMS Choice Patient states their goals for this hospitalization and ongoing recovery are:: to go home CMS Medicare.gov Compare Post Acute Care list provided to:: Patient Choice offered to / list presented to : Patient  Expected Discharge Plan and Services Expected Discharge Plan: [place]   Discharge Planning Services: CM Consult Post Acute Care Choice: [place] arrangements for the past 2 months: Single Family Home                 DME Arranged: N/A         HH Arranged: RN          Prior Living Arrangements/Services Living arrangements for the past 2 months: Single Family Home Lives with:: Adult Children Patient language and need for interpreter reviewed:: Yes Do you feel safe going back to the place where you live?: Yes      Need for  Family Participation in Patient Care: Yes (Comment) Care giver support system in place?: Yes (comment)   Criminal Activity/Legal Involvement Pertinent to Current Situation/Hospitalization: No - Comment as needed  Activities of Daily Living Home Assistive Devices/Equipment: None ADL Screening (condition at time of admission) Patient's cognitive ability adequate to safely complete daily activities?: Yes Is the patient deaf or have difficulty hearing?: No Does the patient have difficulty seeing, even when wearing glasses/contacts?: No Does the patient have difficulty concentrating, remembering, or making decisions?: No Patient able to express need for assistance with ADLs?: Yes Does the patient have difficulty dressing or bathing?: No Independently performs ADLs?: Yes (appropriate for developmental age) Does the patient have difficulty walking or climbing stairs?: Yes Weakness of Legs: Both Weakness of Arms/Hands: None  Permission Sought/Granted   Permission granted to share information with : Yes, Verbal Permission Granted  Share Information with NAME: Casey Black     Permission granted to share info w Relationship: daughter     Emotional Assessment   Attitude/Demeanor/Rapport: Engaged Affect (typically observed): Accepting Orientation: : Oriented to Self, Oriented to Place, Oriented to  Time, Oriented to Situation Alcohol / Substance Use: Not Applicable Psych Involvement: No (comment)  Admission diagnosis:  SBO (small bowel obstruction) ([place]) [K56.609] Essential hypertension [I10] Patient Active Problem List   Diagnosis Date Noted  . SBO (small bowel obstruction) ([place]) 01/23/2019  .  Hypertension 01/23/2019  . Colonic mass 01/23/2019  . Tobacco dependence 01/23/2019  . Hyperglycemia 01/23/2019   PCP:  Patient, No Pcp Per Pharmacy:   [id number] - [place], [place] [id number] [place]  [address] Phone: [phone] Fax: [phone]     Social Determinants of Health (SDOH) Interventions    Readmission Risk Interventions No flowsheet data found.

## 2019-01-25 NOTE — Progress Notes (Addendum)
Pt noted NG tube out. Text paged on call X. Blount NPand received order to reinsert NG tube.

## 2019-01-25 NOTE — Progress Notes (Signed)
PROGRESS NOTE    Casey Black  [id number] DOB: Sep 29, 1960 DOA: 01/23/2019 PCP: Patient, No Pcp Per    Brief Narrative:  58 y.o. female with no known pastmedical history significant(does not see doctors) presenting with abdominal pain.  She reports that her stomach was giving her problems.  When she would eat, it was very painful - couldn't lay down or sit up.  She felt constipated and took 2 Senokot.  About 0100, she developed n/v and was unable to stop and so her daughter brought her in.  The symptoms started maybe 2 months ago but was intermittent.  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  She has had marked unintentional weight loss in the last few months.   ED Course: SBO on Xray. Getting CT scan. Dr. Tomasita Morrow has seen, surgery will follow. NGT in place. IV hydralazine for severe HTN, given Norvasc earlier.   Assessment & Plan:   Active Problems:   SBO (small bowel obstruction) (HCC)   Hypertension   Colonic mass   Tobacco dependence   Hyperglycemia    SBO -Patient who has not received medical care in >10 years (but has insurance) presenting with progressive abdominal pain, n/v, anorexia, and unintentional weight loss -Initial xray suggesting of SBO -Pt had been continued with NPO and NG to low wall suction -General Surgery and GI were consulted, pt now s/p surgery per below  Colonic mass -Mass in the transverse colon seen on CT abd -GI and General Surgery consulted. Ultimately, decision was made to pursue surgery -Pt now s/p transverse colectomy with end colostomy as well as a liver biopsy on 10/21 -Surgical pathology results are pending -Pending return of bowel function  Hyperglycemia -Suspected stress response from acute illness -A1c noted to be 5.2  HTN -remains suboptimally controlled -Have increased lopressor to 10mg  IV q6hrs with holding parameters with addition of enalaprat   Tobacco dependence -Encourage cessation.    -This was discussed with the patient and should be reviewed on an ongoing basis.   -Continued on nicotine patch  DVT prophylaxis: Heparin subq Code Status: Full Family Communication: Pt in room, family at bedside Disposition Plan: Uncertain at this time  Consultants:   GI  General Surgery  Procedures:   Transverse colectomywith end colostomy and liver biopsy 10/21  Antimicrobials: Anti-infectives (From admission, onward)   Start     Dose/Rate Route Frequency Ordered Stop   01/24/19 1030  cefoTEtan (CEFOTAN) 1 g in sodium chloride 0.9 % 100 mL IVPB     1 g 200 mL/hr over 30 Minutes Intravenous On call to O.R. 01/23/19 1344 01/24/19 1146      Subjective: Reports feeling much better today. Asking for ice chips  Objective: Vitals:   01/25/19 0106 01/25/19 0418 01/25/19 0435 01/25/19 0902  BP: (!) 190/76  (!) 166/65   Pulse: 82  89   Resp: 18 17 18 18   Temp: 98.7 F (37.1 C)  98.7 F (37.1 C)   TempSrc: Oral  Oral   SpO2: 100% 96% 99% 96%  Weight:      Height:        Intake/Output Summary (Last 24 hours) at 01/25/2019 1729 Last data filed at 01/25/2019 1017 Gross per 24 hour  Intake 120 ml  Output 1125 ml  Net -1005 ml   Filed Weights   01/24/19 1036  Weight: 92.1 kg    Examination: General exam: Awake, laying in bed, in nad Respiratory system: Normal respiratory effort, no wheezing  Cardiovascular system: regular rate, s1, s2 Gastrointestinal system: Soft, nondistended, positive BS, NG in place Central nervous system: CN2-12 grossly intact, strength intact Extremities: Perfused, no clubbing Skin: Normal skin turgor, no notable skin lesions seen Psychiatry: Mood normal // no visual hallucinations   Data Reviewed: I have personally reviewed following labs and imaging studies  CBC: Recent Labs  Lab 01/23/19 0307 01/24/19 0144 01/25/19 0153  WBC 11.0* 4.8 4.9  HGB 12.7 11.2* 11.3*  HCT 40.2 35.3* 37.3  MCV 73.2* 74.0* 75.7*  PLT 299 252 0000000    Basic Metabolic Panel: Recent Labs  Lab 01/23/19 0307 01/24/19 0144 01/25/19 0153  NA 135 139 141  K 3.5 3.9 3.9  CL 101 105 107  CO2 20* 24 25  GLUCOSE 141* 94 90  BUN 13 19 21*  CREATININE 0.92 0.92 0.83  CALCIUM 10.9* 9.7 8.8*   GFR: Estimated Creatinine Clearance: 86.1 mL/min (by C-G formula based on SCr of 0.83 mg/dL). Liver Function Tests: Recent Labs  Lab 01/23/19 0307  AST 17  ALT 11  ALKPHOS 90  BILITOT 0.4  PROT 8.6*  ALBUMIN 4.2   Recent Labs  Lab 01/23/19 0307  LIPASE 17   No results for input(s): AMMONIA in the last 168 hours. Coagulation Profile: No results for input(s): INR, PROTIME in the last 168 hours. Cardiac Enzymes: No results for input(s): CKTOTAL, CKMB, CKMBINDEX, TROPONINI in the last 168 hours. BNP (last 3 results) No results for input(s): PROBNP in the last 8760 hours. HbA1C: Recent Labs    01/23/19 0939  HGBA1C 5.2   CBG: No results for input(s): GLUCAP in the last 168 hours. Lipid Profile: No results for input(s): CHOL, HDL, LDLCALC, TRIG, CHOLHDL, LDLDIRECT in the last 72 hours. Thyroid Function Tests: Recent Labs    01/23/19 0939  TSH 1.141   Anemia Panel: Recent Labs    01/24/19 0144  VITAMINB12 441  FOLATE 6.6  FERRITIN 10*  TIBC 417  IRON 28  RETICCTPCT 1.2   Sepsis Labs: No results for input(s): PROCALCITON, LATICACIDVEN in the last 168 hours.  Recent Results (from the past 240 hour(s))  SARS CORONAVIRUS 2 (TAT 6-24 HRS) Nasopharyngeal Nasopharyngeal Swab     Status: None   Collection Time: 01/23/19  7:52 AM   Specimen: Nasopharyngeal Swab  Result Value Ref Range Status   SARS Coronavirus 2 NEGATIVE NEGATIVE Final    Comment: (NOTE) SARS-CoV-2 target nucleic acids are NOT DETECTED. The SARS-CoV-2 RNA is generally detectable in upper and lower respiratory specimens during the acute phase of infection. Negative results do not preclude SARS-CoV-2 infection, do not rule out co-infections with other  pathogens, and should not be used as the sole basis for treatment or other patient management decisions. Negative results must be combined with clinical observations, patient history, and epidemiological information. The expected result is Negative. Fact Sheet for Patients: SugarRoll.be Fact Sheet for Healthcare Providers: https://www.woods-mathews.com/ This test is not yet approved or cleared by the [place] FDA and  has been authorized for detection and/or diagnosis of SARS-CoV-2 by FDA under an Emergency Use Authorization (EUA). This EUA will remain  in effect (meaning this test can be used) for the duration of the COVID-19 declaration under Section 56 4(b)(1) of the Act, 21 U.S.C. section 360bbb-3(b)(1), unless the authorization is terminated or revoked sooner. Performed at [facility], [place] [address] [place], [place] [address]   Surgical pcr screen     Status: None   Collection Time: 01/23/19  6:51  PM   Specimen: Nasal Mucosa; Nasal Swab  Result Value Ref Range Status   MRSA, PCR NEGATIVE NEGATIVE Final   Staphylococcus aureus NEGATIVE NEGATIVE Final    Comment: (NOTE) The Xpert SA Assay (FDA approved for NASAL specimens in patients 85 years of age and older), is one component of a comprehensive surveillance program. It is not intended to diagnose infection nor to guide or monitor treatment. Performed at [facility], [place] [address] [place], [place] [address]      Radiology Studies: Dg Chest 2 View  Result Date: 01/25/2019 CLINICAL DATA:  Productive cough for several days. History of metastatic cancer. EXAM: CHEST - 2 VIEW COMPARISON:  05/02/2006 chest x-ray FINDINGS: The cardiac silhouette, mediastinal and hilar contours are within normal limits. Streaky areas of subsegmental atelectasis but no definite infiltrates or effusions. No worrisome pulmonary lesions. There is an NG tube coursing down the esophagus and  into the stomach. The bony thorax is intact. IMPRESSION: No acute cardiopulmonary findings. NG tube is in the stomach. Electronically Signed   By: Marijo Sanes M.D.   On: 01/25/2019 12:45    Scheduled Meds: . Chlorhexidine Gluconate Cloth  6 each Topical Daily  . enalaprilat  0.625 mg Intravenous Q6H  . heparin  5,000 Units Subcutaneous Q8H  . metoprolol tartrate  10 mg Intravenous Q6H  . morphine   Intravenous Q4H  . nicotine  14 mg Transdermal Daily   Continuous Infusions: . lactated ringers 125 mL/hr at 01/25/19 [id number]     LOS: 2 days   Marylu Lund, MD [facility] Pager On Amion  If 7PM-7AM, please contact night-coverage 01/25/2019, 5:29 PM

## 2019-01-25 NOTE — Progress Notes (Signed)
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 Kae Heller, 10/21 - continue NGT until flatus, continue PCA  FEN: NPO, NGT, ice chips ok VTE: SCD's, heparin ID: pre-op only Foley: DC today Follow up: Dr. Kae Heller  DISPO: await return of bowel function, ambulate, path pending    LOS: 2 days    Subjective: CC: no complaints  Pt states she is feeling much better, she is having minimal pain, and no issues overnight. PCA is working well for her.   Objective: Vital signs in last 24 hours: Temp:  [97 F (36.1 C)-98.9 F (37.2 C)] 98.7 F (37.1 C) (10/22 0435) Pulse Rate:  [74-89] 89 (10/22 0435) Resp:  [17-29] 18 (10/22 0435) BP: (166-195)/(55-77) 166/65 (10/22 0435) SpO2:  [92 %-100 %] 99 % (10/22 0435) Weight:  [92.1 kg] 92.1 kg (10/21 1036)    Intake/Output from previous day: 10/21 0701 - 10/22 0700 In: 1655 [I.V.:1515; NG/GT:40; IV Piggyback:100] Out: 1025 [Urine:525; Emesis/NG output:450; Blood:50] Intake/Output this shift: No intake/output data recorded.  PE: Gen:  Alert, NAD, pleasant, cooperative Pulm:  Rate and effort normal Abd: Soft, ND, +BS, stoma is pink, no gas or stool in bag. No TTP Skin: no rashes noted, warm and dry   Anti-infectives: Anti-infectives (From admission, onward)   Start     Dose/Rate Route Frequency Ordered Stop   01/24/19 1030  cefoTEtan (CEFOTAN) 1 g in sodium chloride 0.9 % 100 mL IVPB     1 g 200 mL/hr over 30 Minutes Intravenous On call to O.R. 01/23/19 1344 01/24/19 1146      Lab Results:  Recent Labs    01/24/19 0144 01/25/19 0153  WBC 4.8 4.9  HGB 11.2* 11.3*  HCT 35.3* 37.3  PLT 252 258   BMET Recent Labs    01/24/19 0144 01/25/19 0153  NA 139 141  K 3.9 3.9  CL 105 107  CO2 24 25  GLUCOSE 94 90  BUN 19 21*  CREATININE  0.92 0.83  CALCIUM 9.7 8.8*   PT/INR No results for input(s): LABPROT, INR in the last 72 hours. CMP     Component Value Date/Time   NA 141 01/25/2019 0153   K 3.9 01/25/2019 0153   CL 107 01/25/2019 0153   CO2 25 01/25/2019 0153   GLUCOSE 90 01/25/2019 0153   BUN 21 (H) 01/25/2019 0153   CREATININE 0.83 01/25/2019 0153   CALCIUM 8.8 (L) 01/25/2019 0153   PROT 8.6 (H) 01/23/2019 0307   ALBUMIN 4.2 01/23/2019 0307   AST 17 01/23/2019 0307   ALT 11 01/23/2019 0307   ALKPHOS 90 01/23/2019 0307   BILITOT 0.4 01/23/2019 0307   GFRNONAA >60 01/25/2019 0153   GFRAA >60 01/25/2019 0153   Lipase     Component Value Date/Time   LIPASE 17 01/23/2019 0307    Studies/Results: Dg Abd Portable 1 View  Result Date: 01/23/2019 CLINICAL DATA:  Nasogastric tube placement. EXAM: PORTABLE ABDOMEN - 1 VIEW COMPARISON:  Same day. FINDINGS: Nasogastric tube tip is seen in proximal stomach. Residual intravenous contrast is seen within the nondilated renal collecting systems. Mildly dilated small bowel loops are noted concerning for distal small bowel obstruction. No large bowel dilatation is noted. IMPRESSION: Distal tip of nasogastric tube seen in proximal stomach. Mildly dilated small bowel loops are noted concerning for distal small bowel obstruction.  Electronically Signed   By: Marijo Conception M.D.   On: 01/23/2019 08:35     Kalman Drape, [facility] Pager [phone] Cristine Polio, & Friday 7:00am - 4:30pm Thursdays 7:00am -11:30am

## 2019-01-26 DIAGNOSIS — Z4659 Encounter for fitting and adjustment of other gastrointestinal appliance and device: Secondary | ICD-10-CM

## 2019-01-26 LAB — BASIC METABOLIC PANEL
Anion gap: 12 (ref 5–15)
BUN: 32 mg/dL — ABNORMAL HIGH (ref 6–20)
CO2: 24 mmol/L (ref 22–32)
Calcium: 8.7 mg/dL — ABNORMAL LOW (ref 8.9–10.3)
Chloride: 104 mmol/L (ref 98–111)
Creatinine, Ser: 1.02 mg/dL — ABNORMAL HIGH (ref 0.44–1.00)
GFR calc Af Amer: >60 mL/min (ref >60)
GFR calc non Af Amer: >60 mL/min (ref >60)
Glucose, Bld: 86 mg/dL (ref 70–99)
Potassium: 3.6 mmol/L (ref 3.5–5.1)
Sodium: 140 mmol/L (ref 135–145)

## 2019-01-26 LAB — MAGNESIUM: Magnesium: 1.5 mg/dL — ABNORMAL LOW (ref 1.7–2.4)

## 2019-01-26 LAB — CEA: CEA: 253 ng/mL — ABNORMAL HIGH (ref 0.0–4.7)

## 2019-01-26 MED ORDER — ACETAMINOPHEN 325 MG PO TABS
650.0000 mg | ORAL_TABLET | Freq: Four times a day (QID) | ORAL | Status: DC
  Administered 2019-01-26 – 2019-02-03 (×26): 650 mg via ORAL
  Filled 2019-01-26 (×28): qty 2

## 2019-01-26 MED ORDER — MAGNESIUM SULFATE 4 GM/100ML IV SOLN
4.0000 g | Freq: Once | INTRAVENOUS | Status: AC
  Administered 2019-01-26: 4 g via INTRAVENOUS
  Filled 2019-01-26: qty 100

## 2019-01-26 MED ORDER — WHITE PETROLATUM EX OINT
TOPICAL_OINTMENT | CUTANEOUS | Status: AC
  Administered 2019-01-26: 01:00:00
  Filled 2019-01-26: qty 28.35

## 2019-01-26 NOTE — Plan of Care (Signed)
  Problem: Skin Integrity: Goal: Risk for impaired skin integrity will decrease Outcome: Progressing   

## 2019-01-26 NOTE — Progress Notes (Signed)
PROGRESS NOTE    Casey Black  [id number] DOB: Aug 16, 1960 DOA: 01/23/2019 PCP: Patient, No Pcp Per    Brief Narrative:  58 y.o. female with no known pastmedical history significant(does not see doctors) presenting with abdominal pain.  She reports that her stomach was giving her problems.  When she would eat, it was very painful - couldn't lay down or sit up.  She felt constipated and took 2 Senokot.  About 0100, she developed n/v and was unable to stop and so her daughter brought her in.  The symptoms started maybe 2 months ago but was intermittent.  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  She has had marked unintentional weight loss in the last few months.   ED Course: SBO on Xray. Getting CT scan. Dr. Tomasita Morrow has seen, surgery will follow. NGT in place. IV hydralazine for severe HTN, given Norvasc earlier.   Assessment & Plan:   Active Problems:   SBO (small bowel obstruction) (HCC)   Hypertension   Colonic mass   Tobacco dependence   Hyperglycemia    SBO -Initial xray suggesting of SBO with CT findings worrisome for colonic mass, see below, now s/p colectomy -General Surgery following, per below  Colonic mass -Mass in the transverse colon seen on CT abd -GI and General Surgery consulted. Ultimately, decision was made to pursue surgery -Pt now s/p transverse colectomy with end colostomy as well as a liver biopsy on 10/21 -Surgical pathology results remain pending -Discussed with General Surgery, plan for NG clamping and mobilization  Hyperglycemia -Suspected stress response from acute illness -A1c noted to be 5.2, stable  HTN -remains suboptimally controlled -Currently on lopressor 10mg  IV q6hrs with holding parameters with addition of enalaprat IV -Anticipate transitioning to PO meds when pt is able to reliably tolerate PO intake  Tobacco dependence -Encourage cessation.   -This was discussed with the patient and should be  reviewed on an ongoing basis.   -Continued on nicotine patch as tolerated  DVT prophylaxis: Heparin subq Code Status: Full Family Communication: Pt in room, family not at bedside Disposition Plan: Uncertain at this time  Consultants:   GI  General Surgery  Procedures:   Transverse colectomywith end colostomy and liver biopsy 10/21  Antimicrobials: Anti-infectives (From admission, onward)   Start     Dose/Rate Route Frequency Ordered Stop   01/24/19 1030  cefoTEtan (CEFOTAN) 1 g in sodium chloride 0.9 % 100 mL IVPB     1 g 200 mL/hr over 30 Minutes Intravenous On call to O.R. 01/23/19 1344 01/24/19 1146      Subjective: Reports some abd discomfort this AM, asking for ice chips  Objective: Vitals:   01/26/19 0545 01/26/19 0827 01/26/19 1104 01/26/19 1615  BP: (!) 166/66     Pulse: 96     Resp: (!) 24 20 20 16   Temp: 100.3 F (37.9 C)     TempSrc: Oral     SpO2: 92% 93% 93% 92%  Weight:      Height:        Intake/Output Summary (Last 24 hours) at 01/26/2019 1620 Last data filed at 01/26/2019 0439 Gross per 24 hour  Intake 60 ml  Output 350 ml  Net -290 ml   Filed Weights   01/24/19 1036  Weight: 92.1 kg    Examination: General exam: Conversant, appears uncomfortable Respiratory system: normal chest rise, clear, no audible wheezing Cardiovascular system: regular rhythm, s1-s2 Gastrointestinal system: decreased BS, nondistended Central nervous  system: No seizures, no tremors Extremities: No cyanosis, no joint deformities Skin: No rashes, no pallor Psychiatry: Affect normal // no auditory hallucinations   Data Reviewed: I have personally reviewed following labs and imaging studies  CBC: Recent Labs  Lab 01/23/19 0307 01/24/19 0144 01/25/19 0153  WBC 11.0* 4.8 4.9  HGB 12.7 11.2* 11.3*  HCT 40.2 35.3* 37.3  MCV 73.2* 74.0* 75.7*  PLT 299 252 0000000   Basic Metabolic Panel: Recent Labs  Lab 01/23/19 0307 01/24/19 0144 01/25/19 0153 01/26/19  0349  NA 135 139 141 140  K 3.5 3.9 3.9 3.6  CL 101 105 107 104  CO2 20* 24 25 24   GLUCOSE 141* 94 90 86  BUN 13 19 21* 32*  CREATININE 0.92 0.92 0.83 1.02*  CALCIUM 10.9* 9.7 8.8* 8.7*  MG  --   --   --  1.5*   GFR: Estimated Creatinine Clearance: 70 mL/min (A) (by C-G formula based on SCr of 1.02 mg/dL (H)). Liver Function Tests: Recent Labs  Lab 01/23/19 0307  AST 17  ALT 11  ALKPHOS 90  BILITOT 0.4  PROT 8.6*  ALBUMIN 4.2   Recent Labs  Lab 01/23/19 0307  LIPASE 17   No results for input(s): AMMONIA in the last 168 hours. Coagulation Profile: No results for input(s): INR, PROTIME in the last 168 hours. Cardiac Enzymes: No results for input(s): CKTOTAL, CKMB, CKMBINDEX, TROPONINI in the last 168 hours. BNP (last 3 results) No results for input(s): PROBNP in the last 8760 hours. HbA1C: No results for input(s): HGBA1C in the last 72 hours. CBG: No results for input(s): GLUCAP in the last 168 hours. Lipid Profile: No results for input(s): CHOL, HDL, LDLCALC, TRIG, CHOLHDL, LDLDIRECT in the last 72 hours. Thyroid Function Tests: No results for input(s): TSH, T4TOTAL, FREET4, T3FREE, THYROIDAB in the last 72 hours. Anemia Panel: Recent Labs    01/24/19 0144  VITAMINB12 441  FOLATE 6.6  FERRITIN 10*  TIBC 417  IRON 28  RETICCTPCT 1.2   Sepsis Labs: No results for input(s): PROCALCITON, LATICACIDVEN in the last 168 hours.  Recent Results (from the past 240 hour(s))  SARS CORONAVIRUS 2 (TAT 6-24 HRS) Nasopharyngeal Nasopharyngeal Swab     Status: None   Collection Time: 01/23/19  7:52 AM   Specimen: Nasopharyngeal Swab  Result Value Ref Range Status   SARS Coronavirus 2 NEGATIVE NEGATIVE Final    Comment: (NOTE) SARS-CoV-2 target nucleic acids are NOT DETECTED. The SARS-CoV-2 RNA is generally detectable in upper and lower respiratory specimens during the acute phase of infection. Negative results do not preclude SARS-CoV-2 infection, do not rule out  co-infections with other pathogens, and should not be used as the sole basis for treatment or other patient management decisions. Negative results must be combined with clinical observations, patient history, and epidemiological information. The expected result is Negative. Fact Sheet for Patients: SugarRoll.be Fact Sheet for Healthcare Providers: https://www.woods-mathews.com/ This test is not yet approved or cleared by the [place] FDA and  has been authorized for detection and/or diagnosis of SARS-CoV-2 by FDA under an Emergency Use Authorization (EUA). This EUA will remain  in effect (meaning this test can be used) for the duration of the COVID-19 declaration under Section 56 4(b)(1) of the Act, 21 U.S.C. section 360bbb-3(b)(1), unless the authorization is terminated or revoked sooner. Performed at [facility], [place] [address] [place], [place] [address]   Surgical pcr screen     Status: None   Collection  Time: 01/23/19  6:51 PM   Specimen: Nasal Mucosa; Nasal Swab  Result Value Ref Range Status   MRSA, PCR NEGATIVE NEGATIVE Final   Staphylococcus aureus NEGATIVE NEGATIVE Final    Comment: (NOTE) The Xpert SA Assay (FDA approved for NASAL specimens in patients 51 years of age and older), is one component of a comprehensive surveillance program. It is not intended to diagnose infection nor to guide or monitor treatment. Performed at [facility], [place] [address] [place], [place] [address]      Radiology Studies: Dg Chest 2 View  Result Date: 01/25/2019 CLINICAL DATA:  Productive cough for several days. History of metastatic cancer. EXAM: CHEST - 2 VIEW COMPARISON:  05/02/2006 chest x-ray FINDINGS: The cardiac silhouette, mediastinal and hilar contours are within normal limits. Streaky areas of subsegmental atelectasis but no definite infiltrates or effusions. No worrisome pulmonary lesions. There is an NG tube  coursing down the esophagus and into the stomach. The bony thorax is intact. IMPRESSION: No acute cardiopulmonary findings. NG tube is in the stomach. Electronically Signed   By: Marijo Sanes M.D.   On: 01/25/2019 12:45   Dg Abd 1 View  Result Date: 01/26/2019 CLINICAL DATA:  NG tube placement EXAM: ABDOMEN - 1 VIEW COMPARISON:  January 23, 2019 FINDINGS: The bowel gas pattern is normal. Tip the NG tube is seen projecting over the proximal stomach. IMPRESSION: Tip of NG tube seen projecting over the proximal stomach. Electronically Signed   By: Prudencio Pair M.D.   On: 01/26/2019 00:45    Scheduled Meds: . acetaminophen  650 mg Oral Q6H  . Chlorhexidine Gluconate Cloth  6 each Topical Daily  . enalaprilat  0.625 mg Intravenous Q6H  . heparin  5,000 Units Subcutaneous Q8H  . metoprolol tartrate  10 mg Intravenous Q6H  . morphine   Intravenous Q4H  . nicotine  14 mg Transdermal Daily   Continuous Infusions: . lactated ringers 125 mL/hr at 01/26/19 1216     LOS: 3 days   Marylu Lund, MD [facility] Pager On Amion  If 7PM-7AM, please contact night-coverage 01/26/2019, 4:20 PM

## 2019-01-26 NOTE — Plan of Care (Signed)
  Problem: Pain Managment: Goal: General experience of comfort will improve Outcome: Progressing   Problem: Safety: Goal: Ability to remain free from injury will improve Outcome: Progressing   Problem: Skin Integrity: Goal: Risk for impaired skin integrity will decrease Outcome: Progressing   

## 2019-01-26 NOTE — Progress Notes (Signed)
[facility]/Trauma Progress Note  2 Days Post-Op   Assessment/Plan HTN - new on admission  Obstructing transverse colon mass with likely liver metastases - POD#2 S/P transverse colectomy with end colostomy, liver biopsy, Dr. Kae Heller, 10/21 - clamp NGT and allow clears, continue PCA  FEN: NGT clamping trial, clears from floor, tylenol for pain VTE: SCD's, heparin ID: pre-op only Foley: DC'd 10/22 Follow up: Dr. Kae Heller  DISPO: IS, ambulate, path pending    LOS: 3 days    Subjective: CC: abdominal pain  Pt states she is having a productive cough today. Her abdomen hurts when she coughs. She is in more pain today than yesterday. She states she had a rough night. 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 NGT came out and had to be replaced. I discussed importance of IS.   Objective: Vital signs in last 24 hours: Temp:  [100 F (37.8 C)-100.3 F (37.9 C)] 100.3 F (37.9 C) (10/23 0545) Pulse Rate:  [96-109] 96 (10/23 0545) Resp:  [18-24] 20 (10/23 0827) BP: (148-166)/(61-83) 166/66 (10/23 0545) SpO2:  [90 %-100 %] 93 % (10/23 0827) Last BM Date: (PTA)  Intake/Output from previous day: 10/22 0701 - 10/23 0700 In: 180 [P.O.:180] Out: 550 [Urine:200; Emesis/NG output:350] Intake/Output this shift: No intake/output data recorded.  PE:  Gen:  Alert, NAD, pleasant, cooperative, appears uncomfortable  Pulm:  Rate and effort normal Abd: Soft, ND, +BS, stoma is pink, small amt of gas and stool in bag. generalized TTP, no peritonitis  Skin: no rashes noted, warm and dry  Anti-infectives: Anti-infectives (From admission, onward)   Start     Dose/Rate Route Frequency Ordered Stop   01/24/19 1030  cefoTEtan (CEFOTAN) 1 g in sodium chloride 0.9 % 100 mL IVPB     1 g 200 mL/hr over 30 Minutes Intravenous On call to O.R. 01/23/19 1344 01/24/19 1146      Lab Results:  Recent Labs    01/24/19 0144 01/25/19 0153  WBC 4.8 4.9  HGB 11.2* 11.3*  HCT  35.3* 37.3  PLT 252 258   BMET Recent Labs    01/25/19 0153 01/26/19 0349  NA 141 140  K 3.9 3.6  CL 107 104  CO2 25 24  GLUCOSE 90 86  BUN 21* 32*  CREATININE 0.83 1.02*  CALCIUM 8.8* 8.7*   PT/INR No results for input(s): LABPROT, INR in the last 72 hours. CMP     Component Value Date/Time   NA 140 01/26/2019 0349   K 3.6 01/26/2019 0349   CL 104 01/26/2019 0349   CO2 24 01/26/2019 0349   GLUCOSE 86 01/26/2019 0349   BUN 32 (H) 01/26/2019 0349   CREATININE 1.02 (H) 01/26/2019 0349   CALCIUM 8.7 (L) 01/26/2019 0349   PROT 8.6 (H) 01/23/2019 0307   ALBUMIN 4.2 01/23/2019 0307   AST 17 01/23/2019 0307   ALT 11 01/23/2019 0307   ALKPHOS 90 01/23/2019 0307   BILITOT 0.4 01/23/2019 0307   GFRNONAA >60 01/26/2019 0349   GFRAA >60 01/26/2019 0349   Lipase     Component Value Date/Time   LIPASE 17 01/23/2019 0307    Studies/Results: Dg Chest 2 View  Result Date: 01/25/2019 CLINICAL DATA:  Productive cough for several days. History of metastatic cancer. EXAM: CHEST - 2 VIEW COMPARISON:  05/02/2006 chest x-ray FINDINGS: The cardiac silhouette, mediastinal and hilar contours are within normal limits. Streaky areas of subsegmental atelectasis but no definite infiltrates or effusions. No worrisome pulmonary lesions. There  is an NG tube coursing down the esophagus and into the stomach. The bony thorax is intact. IMPRESSION: No acute cardiopulmonary findings. NG tube is in the stomach. Electronically Signed   By: Marijo Sanes M.D.   On: 01/25/2019 12:45   Dg Abd 1 View  Result Date: 01/26/2019 CLINICAL DATA:  NG tube placement EXAM: ABDOMEN - 1 VIEW COMPARISON:  January 23, 2019 FINDINGS: The bowel gas pattern is normal. Tip the NG tube is seen projecting over the proximal stomach. IMPRESSION: Tip of NG tube seen projecting over the proximal stomach. Electronically Signed   By: Prudencio Pair M.D.   On: 01/26/2019 00:45     Kalman Drape, [facility]  Surgery Please see amion for pager for the following: Cristine Polio, & Friday 7:00am - 4:30pm Thursdays 7:00am -11:30am

## 2019-01-26 NOTE — TOC Progression Note (Addendum)
Transition of Care [facility]) - Progression Note    Patient Details  Name: Casey Black MRN: [id number] Date of Birth: 10-17-1960  Transition of Care [facility]) CM/SW Contact  Jacalyn Lefevre Edson Snowball, RN Phone Number: 01/26/2019, 12:25 PM  Clinical Narrative:     [name] with [facility] cannot accept due to not being in network with insurance.  Drew with [facility] also out of network. Cory with [facility] cannot accept due to staffing.  Mateo Flow with [facility] cannot accept. Cheryl with [facility] cannot accept.  Left voice mail for Dorian Pod with [facility]ian Pod returned call and cannot accept.  Called Carol with [facility] , they do not cover patient's address.   Tiffany with [facility] unable to accept patient.   Spoke with Mortimer Fries with [facility] , they are out of network  Spoke with Sarah with [facility] they do not staffing to accept referral.  [name] with [facility] checking to see if she can accept patient   Expected Discharge Plan: [place] Barriers to Discharge: Continued Medical Work up  Expected Discharge Plan and Services Expected Discharge Plan: [place]   Discharge Planning Services: CM Consult Post Acute Care Choice: [place] arrangements for the past 2 months: Single Family Home                 DME Arranged: N/A         HH Arranged: RN           Social Determinants of Health (SDOH) Interventions    Readmission Risk Interventions No flowsheet data found.

## 2019-01-26 NOTE — Consult Note (Signed)
[place] Nurse ostomy consult note Stoma type/location: RUQ, transverse colostomy Stomal assessment/size: 2 1/4" round, os at center, pink, moist, edematous  Peristomal assessment: intact  Treatment options for stomal/peristomal skin: 2" skin barrier ring Output: liquid brown Ostomy pouching: 2Pc 2 3/4" with 2" barrier ring 3 extra pouches/skin barriers and barrier rings left in room  Education provided:  Daughter at bedside, patient lives with daughter at her home.  Patient has 3 sisters that will be active in her care as well.  Explained role of ostomy nurse and creation of stoma  Explained stoma characteristics (budded, flush, color, texture, care) Demonstrated pouch change (cutting new skin barrier, measuring stoma, cleaning peristomal skin and stoma, use of barrier ring) Education on emptying when 1/3 to 1/2 full and how to empty Demonstrated "burping" flatus from pouch Demonstrated use of wick to clean spout  Discussed bathing, diet, gas, medication use, constipation Discussed risk of peristomal hernia Provided patient with [employer] and marked items currently using Answered patient/family questions:   Pouch change frequency  Disposal of ostomy pouches  Daughter cut new skin barrier, she attached pouch to skin barrier, able to use lock and roll closure Patient did not participate in pouch change, she is quite sleepy and not engaged for this reason.   Enrolled patient in [place] program: Yes  .[place] Nurse will follow along with you for continued support with ostomy teaching and care [place] MSN, [place], [place], [place], [place]

## 2019-01-27 LAB — MAGNESIUM: Magnesium: 2.4 mg/dL (ref 1.7–2.4)

## 2019-01-27 LAB — COMPREHENSIVE METABOLIC PANEL
ALT: 16 U/L (ref 0–44)
AST: 28 U/L (ref 15–41)
Albumin: 2.3 g/dL — ABNORMAL LOW (ref 3.5–5.0)
Alkaline Phosphatase: 52 U/L (ref 38–126)
Anion gap: 12 (ref 5–15)
BUN: 28 mg/dL — ABNORMAL HIGH (ref 6–20)
CO2: 25 mmol/L (ref 22–32)
Calcium: 8.8 mg/dL — ABNORMAL LOW (ref 8.9–10.3)
Chloride: 104 mmol/L (ref 98–111)
Creatinine, Ser: 0.88 mg/dL (ref 0.44–1.00)
GFR calc Af Amer: >60 mL/min (ref >60)
GFR calc non Af Amer: >60 mL/min (ref >60)
Glucose, Bld: 86 mg/dL (ref 70–99)
Potassium: 3.5 mmol/L (ref 3.5–5.1)
Sodium: 141 mmol/L (ref 135–145)
Total Bilirubin: 0.6 mg/dL (ref 0.3–1.2)
Total Protein: 5.9 g/dL — ABNORMAL LOW (ref 6.5–8.1)

## 2019-01-27 LAB — CBC
HCT: 28.2 % — ABNORMAL LOW (ref 36.0–46.0)
Hemoglobin: 8.7 g/dL — ABNORMAL LOW (ref 12.0–15.0)
MCH: 23.4 pg — ABNORMAL LOW (ref 26.0–34.0)
MCHC: 30.9 g/dL (ref 30.0–36.0)
MCV: 75.8 fL — ABNORMAL LOW (ref 80.0–100.0)
Platelets: 211 10*3/uL (ref 150–400)
RBC: 3.72 MIL/uL — ABNORMAL LOW (ref 3.87–5.11)
RDW: 19.3 % — ABNORMAL HIGH (ref 11.5–15.5)
WBC: 6.4 10*3/uL (ref 4.0–10.5)
nRBC: 0 % (ref 0.0–0.2)

## 2019-01-27 MED ORDER — ISOSORBIDE MONONITRATE ER 30 MG PO TB24
30.0000 mg | ORAL_TABLET | Freq: Every day | ORAL | Status: DC
  Administered 2019-01-27 – 2019-02-03 (×8): 30 mg via ORAL
  Filled 2019-01-27 (×8): qty 1

## 2019-01-27 MED ORDER — HYDRALAZINE HCL 20 MG/ML IJ SOLN
10.0000 mg | INTRAMUSCULAR | Status: DC | PRN
  Administered 2019-01-27: 5 mg via INTRAVENOUS
  Administered 2019-01-28 – 2019-01-30 (×2): 10 mg via INTRAVENOUS
  Filled 2019-01-27 (×3): qty 1

## 2019-01-27 MED ORDER — HYDRALAZINE HCL 10 MG PO TABS
10.0000 mg | ORAL_TABLET | Freq: Three times a day (TID) | ORAL | Status: DC
  Administered 2019-01-27 – 2019-01-28 (×2): 10 mg via ORAL
  Filled 2019-01-27 (×3): qty 1

## 2019-01-27 MED ORDER — POTASSIUM CHLORIDE CRYS ER 20 MEQ PO TBCR
40.0000 meq | EXTENDED_RELEASE_TABLET | Freq: Once | ORAL | Status: AC
  Administered 2019-01-27: 40 meq via ORAL
  Filled 2019-01-27: qty 2

## 2019-01-27 MED ORDER — LABETALOL HCL 5 MG/ML IV SOLN
5.0000 mg | INTRAVENOUS | Status: DC | PRN
  Administered 2019-01-27 (×2): 5 mg via INTRAVENOUS
  Filled 2019-01-27 (×2): qty 4

## 2019-01-27 MED ORDER — ENALAPRILAT 1.25 MG/ML IV SOLN
1.2500 mg | Freq: Four times a day (QID) | INTRAVENOUS | Status: DC
  Administered 2019-01-27 – 2019-01-28 (×3): 1.25 mg via INTRAVENOUS
  Filled 2019-01-27 (×4): qty 1

## 2019-01-27 NOTE — Plan of Care (Signed)
  Problem: Skin Integrity: Goal: Risk for impaired skin integrity will decrease Outcome: Progressing   

## 2019-01-27 NOTE — Progress Notes (Signed)
PROGRESS NOTE    Casey Black  [id number] DOB: 10/15/1960 DOA: 01/23/2019 PCP: Patient, No Pcp Per    Brief Narrative:  58 y.o. female with no known pastmedical history significant(does not see doctors) presenting with abdominal pain.  She reports that her stomach was giving her problems.  When she would eat, it was very painful - couldn't lay down or sit up.  She felt constipated and took 2 Senokot.  About 0100, she developed n/v and was unable to stop and so her daughter brought her in.  The symptoms started maybe 2 months ago but was intermittent.  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  She has had marked unintentional weight loss in the last few months.   ED Course: SBO on Xray. Getting CT scan. Dr. Tomasita Morrow has seen, surgery will follow. NGT in place. IV hydralazine for severe HTN, given Norvasc earlier.   Assessment & Plan:   Active Problems:   SBO (small bowel obstruction) (HCC)   Hypertension   Colonic mass   Tobacco dependence   Hyperglycemia    SBO -Initial xray suggesting of SBO with CT findings worrisome for colonic mass, see below, now s/p colectomy -General Surgery is following. Recommendation for clamping NG and start to advance PO  Colonic mass -Mass in the transverse colon seen on CT abd -GI and General Surgery consulted. Ultimately, decision was made to pursue surgery -Pt now s/p transverse colectomy with end colostomy as well as a liver biopsy on 10/21 -Surgical pathology are pending -General Surgery recommendations for clamping NG today  Hyperglycemia -Suspected stress response from acute illness -A1c noted to be 5.2, remains stable  Hypertensive emergency -Presenting SBP into the 200's noted -Currently on lopressor 10mg  IV q6hrs with holding parameters with addition of enalaprat IV -BP remains poorly controlled. Increased enalaprat to 1.25mg  q6hrs  -Will give trial of addition of Imdur. If pt tolerates and responds  to oral med, would then consider transitioning to PO beta blocker as well as PO ACEI  Tobacco dependence -Encourage cessation.   -This was discussed with the patient and should be reviewed on an ongoing basis.   -Continued on nicotine patch as tolerated  DVT prophylaxis: Heparin subq Code Status: Full Family Communication: Pt in room, family not at bedside Disposition Plan: Uncertain at this time  Consultants:   GI  General Surgery  Procedures:   Transverse colectomywith end colostomy and liver biopsy 10/21  Antimicrobials: Anti-infectives (From admission, onward)   Start     Dose/Rate Route Frequency Ordered Stop   01/24/19 1030  cefoTEtan (CEFOTAN) 1 g in sodium chloride 0.9 % 100 mL IVPB     1 g 200 mL/hr over 30 Minutes Intravenous On call to O.R. 01/23/19 1344 01/24/19 1146      Subjective: Complaining of continued post-operative discomfort. Reports passing stool recently  Objective: Vitals:   01/27/19 1159 01/27/19 1458 01/27/19 1600 01/27/19 1628  BP:  (!) 212/78  (!) 187/61  Pulse:  80    Resp: 20  20   Temp:  98.6 F (37 C)    TempSrc:  Oral    SpO2: 98% 96% 96%   Weight:      Height:        Intake/Output Summary (Last 24 hours) at 01/27/2019 1647 Last data filed at 01/27/2019 1515 Gross per 24 hour  Intake -  Output 2310 ml  Net -2310 ml   Filed Weights   01/24/19 1036  Weight: 92.1  kg    Examination: General exam: Awake, laying in bed, in nad Respiratory system: Normal respiratory effort, no wheezing Cardiovascular system: regular rate, s1, s2 Gastrointestinal system: Soft, nondistended, positive BS, NG in place Central nervous system: CN2-12 grossly intact, strength intact Extremities: Perfused, no clubbing Skin: Normal skin turgor, no notable skin lesions seen Psychiatry: Mood normal // no visual hallucinations   Data Reviewed: I have personally reviewed following labs and imaging studies  CBC: Recent Labs  Lab 01/23/19 0307  01/24/19 0144 01/25/19 0153 01/27/19 0137  WBC 11.0* 4.8 4.9 6.4  HGB 12.7 11.2* 11.3* 8.7*  HCT 40.2 35.3* 37.3 28.2*  MCV 73.2* 74.0* 75.7* 75.8*  PLT 299 252 258 [id number]   Basic Metabolic Panel: Recent Labs  Lab 01/23/19 0307 01/24/19 0144 01/25/19 0153 01/26/19 0349 01/27/19 0137 01/27/19 0807  NA 135 139 141 140 141  --   K 3.5 3.9 3.9 3.6 3.5  --   CL 101 105 107 104 104  --   CO2 20* 24 25 24 25   --   GLUCOSE 141* 94 90 86 86  --   BUN 13 19 21* 32* 28*  --   CREATININE 0.92 0.92 0.83 1.02* 0.88  --   CALCIUM 10.9* 9.7 8.8* 8.7* 8.8*  --   MG  --   --   --  1.5*  --  2.4   GFR: Estimated Creatinine Clearance: 81.2 mL/min (by C-G formula based on SCr of 0.88 mg/dL). Liver Function Tests: Recent Labs  Lab 01/23/19 0307 01/27/19 0137  AST 17 28  ALT 11 16  ALKPHOS 90 52  BILITOT 0.4 0.6  PROT 8.6* 5.9*  ALBUMIN 4.2 2.3*   Recent Labs  Lab 01/23/19 0307  LIPASE 17   No results for input(s): AMMONIA in the last 168 hours. Coagulation Profile: No results for input(s): INR, PROTIME in the last 168 hours. Cardiac Enzymes: No results for input(s): CKTOTAL, CKMB, CKMBINDEX, TROPONINI in the last 168 hours. BNP (last 3 results) No results for input(s): PROBNP in the last 8760 hours. HbA1C: No results for input(s): HGBA1C in the last 72 hours. CBG: No results for input(s): GLUCAP in the last 168 hours. Lipid Profile: No results for input(s): CHOL, HDL, LDLCALC, TRIG, CHOLHDL, LDLDIRECT in the last 72 hours. Thyroid Function Tests: No results for input(s): TSH, T4TOTAL, FREET4, T3FREE, THYROIDAB in the last 72 hours. Anemia Panel: No results for input(s): VITAMINB12, FOLATE, FERRITIN, TIBC, IRON, RETICCTPCT in the last 72 hours. Sepsis Labs: No results for input(s): PROCALCITON, LATICACIDVEN in the last 168 hours.  Recent Results (from the past 240 hour(s))  SARS CORONAVIRUS 2 (TAT 6-24 HRS) Nasopharyngeal Nasopharyngeal Swab     Status: None   Collection  Time: 01/23/19  7:52 AM   Specimen: Nasopharyngeal Swab  Result Value Ref Range Status   SARS Coronavirus 2 NEGATIVE NEGATIVE Final    Comment: (NOTE) SARS-CoV-2 target nucleic acids are NOT DETECTED. The SARS-CoV-2 RNA is generally detectable in upper and lower respiratory specimens during the acute phase of infection. Negative results do not preclude SARS-CoV-2 infection, do not rule out co-infections with other pathogens, and should not be used as the sole basis for treatment or other patient management decisions. Negative results must be combined with clinical observations, patient history, and epidemiological information. The expected result is Negative. Fact Sheet for Patients: SugarRoll.be Fact Sheet for Healthcare Providers: https://www.woods-mathews.com/ This test is not yet approved or cleared by the [place] FDA and  has been  authorized for detection and/or diagnosis of SARS-CoV-2 by FDA under an Emergency Use Authorization (EUA). This EUA will remain  in effect (meaning this test can be used) for the duration of the COVID-19 declaration under Section 56 4(b)(1) of the Act, 21 U.S.C. section 360bbb-3(b)(1), unless the authorization is terminated or revoked sooner. Performed at [facility], [place] [address], [place], [place] [address]   Surgical pcr screen     Status: None   Collection Time: 01/23/19  6:51 PM   Specimen: Nasal Mucosa; Nasal Swab  Result Value Ref Range Status   MRSA, PCR NEGATIVE NEGATIVE Final   Staphylococcus aureus NEGATIVE NEGATIVE Final    Comment: (NOTE) The Xpert SA Assay (FDA approved for NASAL specimens in patients 92 years of age and older), is one component of a comprehensive surveillance program. It is not intended to diagnose infection nor to guide or monitor treatment. Performed at [facility], [place] [address] [place], [place] [address]      Radiology Studies: Dg Abd 1 View   Result Date: 01/26/2019 CLINICAL DATA:  NG tube placement EXAM: ABDOMEN - 1 VIEW COMPARISON:  January 23, 2019 FINDINGS: The bowel gas pattern is normal. Tip the NG tube is seen projecting over the proximal stomach. IMPRESSION: Tip of NG tube seen projecting over the proximal stomach. Electronically Signed   By: Prudencio Pair M.D.   On: 01/26/2019 00:45    Scheduled Meds: . acetaminophen  650 mg Oral Q6H  . Chlorhexidine Gluconate Cloth  6 each Topical Daily  . enalaprilat  1.25 mg Intravenous Q6H  . heparin  5,000 Units Subcutaneous Q8H  . isosorbide mononitrate  30 mg Oral Daily  . metoprolol tartrate  10 mg Intravenous Q6H  . morphine   Intravenous Q4H  . nicotine  14 mg Transdermal Daily   Continuous Infusions:    LOS: 4 days   Marylu Lund, MD [facility] Pager On Amion  If 7PM-7AM, please contact night-coverage 01/27/2019, 4:47 PM

## 2019-01-27 NOTE — Progress Notes (Addendum)
Patient ID: Casey Black, female   DOB: 1961/02/09, 58 y.o.   MRN: [id number]   Acute Care Surgery Service Progress Note:    Chief Complaint/Subjective: Spent time in chair No walk in halls No n/v Pain ok 800 out of NG - dark brown  Objective: Vital signs in last 24 hours: Temp:  [97.9 F (36.6 C)-98.3 F (36.8 C)] 97.9 F (36.6 C) (10/24 0631) Pulse Rate:  [72-78] 73 (10/24 0631) Resp:  [16-25] 18 (10/24 0631) BP: (149-202)/(64-86) 175/72 (10/24 0656) SpO2:  [92 %-99 %] 99 % (10/24 0631) Last BM Date: (pta)  Intake/Output from previous day: 10/23 0701 - 10/24 0700 In: -  Out: 1860 [Urine:900; Emesis/NG output:810; Stool:150] Intake/Output this shift: No intake/output data recorded.  Lungs: cta, nonlabored; IS only about 500-600  Cardiovascular: reg  Abd: soft, mild distension, incision ok. Ostomy - hyperemic but appears viable, some stool in bag  Extremities: no edema, +SCDs  Neuro: alert, nonfocal  Lab Results: CBC  Recent Labs    01/25/19 0153 01/27/19 0137  WBC 4.9 6.4  HGB 11.3* 8.7*  HCT 37.3 28.2*  PLT 258 211   CBC Latest Ref Rng & Units 01/27/2019 01/25/2019 01/24/2019  WBC 4.0 - 10.5 K/uL 6.4 4.9 4.8  Hemoglobin 12.0 - 15.0 g/dL 8.7(L) 11.3(L) 11.2(L)  Hematocrit 36.0 - 46.0 % 28.2(L) 37.3 35.3(L)  Platelets 150 - 400 K/uL 211 258 252    BMET Recent Labs    01/26/19 0349 01/27/19 0137  NA 140 141  K 3.6 3.5  CL 104 104  CO2 24 25  GLUCOSE 86 86  BUN 32* 28*  CREATININE 1.02* 0.88  CALCIUM 8.7* 8.8*   LFT Hepatic Function Latest Ref Rng & Units 01/27/2019 01/23/2019  Total Protein 6.5 - 8.1 g/dL 5.9(L) 8.6(H)  Albumin 3.5 - 5.0 g/dL 2.3(L) 4.2  AST 15 - 41 U/L 28 17  ALT 0 - 44 U/L 16 11  Alk Phosphatase 38 - 126 U/L 52 90  Total Bilirubin 0.3 - 1.2 mg/dL 0.6 0.4   PT/INR No results for input(s): LABPROT, INR in the last 72 hours. ABG No results for input(s): PHART, HCO3 in the last 72 hours.  Invalid input(s): PCO2,  PO2  Studies/Results:  Anti-infectives: Anti-infectives (From admission, onward)   Start     Dose/Rate Route Frequency Ordered Stop   01/24/19 1030  cefoTEtan (CEFOTAN) 1 g in sodium chloride 0.9 % 100 mL IVPB     1 g 200 mL/hr over 30 Minutes Intravenous On call to O.R. 01/23/19 1344 01/24/19 1146      Medications: Scheduled Meds: . acetaminophen  650 mg Oral Q6H  . Chlorhexidine Gluconate Cloth  6 each Topical Daily  . enalaprilat  0.625 mg Intravenous Q6H  . heparin  5,000 Units Subcutaneous Q8H  . metoprolol tartrate  10 mg Intravenous Q6H  . morphine   Intravenous Q4H  . nicotine  14 mg Transdermal Daily   Continuous Infusions: . lactated ringers 125 mL/hr at 01/27/19 0256   PRN Meds:.diphenhydrAMINE **OR** diphenhydrAMINE, hydrALAZINE, naloxone **AND** sodium chloride flush, ondansetron **OR** ondansetron (ZOFRAN) IV, ondansetron (ZOFRAN) IV  Assessment/Plan: Patient Active Problem List   Diagnosis Date Noted  . SBO (small bowel obstruction) ([place]) 01/23/2019  . Hypertension 01/23/2019  . Colonic mass 01/23/2019  . Tobacco dependence 01/23/2019  . Hyperglycemia 01/23/2019   s/p Procedure(s): PARTIAL COLECTOMY End Loop Colostomy 01/24/2019  HTN - new on admission  Obstructing transverse colon mass with likely liver metastases - POD#3  S/P transverse colectomy with end colostomy, liver biopsy, Dr. Kae Heller, 10/21 - cont clamp NGT and allow clears, continue PCA  Acute blood loss anemia - hgb 11.3-->8.7 today; repeat in am, cont subcu heparin  FEN:NGT clamping trial, clears from floor, tylenol for pain VTE: SCD's,heparin ID:pre-op only Foley:DC'd 10/22 Follow up:Dr. Kae Heller  DISPO:IS, ambulate, path pending, ambulate; hopefully pull NG Sunday, f/u cbc sunday   LOS: 4 days    Leighton Ruff. Redmond Pulling, MD, FACS General, Bariatric, & Minimally Invasive Surgery [phone] [facility]

## 2019-01-28 LAB — COMPREHENSIVE METABOLIC PANEL
ALT: 16 U/L (ref 0–44)
AST: 21 U/L (ref 15–41)
Albumin: 2.3 g/dL — ABNORMAL LOW (ref 3.5–5.0)
Alkaline Phosphatase: 58 U/L (ref 38–126)
Anion gap: 11 (ref 5–15)
BUN: 20 mg/dL (ref 6–20)
CO2: 28 mmol/L (ref 22–32)
Calcium: 9 mg/dL (ref 8.9–10.3)
Chloride: 105 mmol/L (ref 98–111)
Creatinine, Ser: 0.72 mg/dL (ref 0.44–1.00)
GFR calc Af Amer: >60 mL/min (ref >60)
GFR calc non Af Amer: >60 mL/min (ref >60)
Glucose, Bld: 64 mg/dL — ABNORMAL LOW (ref 70–99)
Potassium: 3.3 mmol/L — ABNORMAL LOW (ref 3.5–5.1)
Sodium: 144 mmol/L (ref 135–145)
Total Bilirubin: 0.8 mg/dL (ref 0.3–1.2)
Total Protein: 6.2 g/dL — ABNORMAL LOW (ref 6.5–8.1)

## 2019-01-28 LAB — CBC
HCT: 28.7 % — ABNORMAL LOW (ref 36.0–46.0)
Hemoglobin: 9 g/dL — ABNORMAL LOW (ref 12.0–15.0)
MCH: 23.6 pg — ABNORMAL LOW (ref 26.0–34.0)
MCHC: 31.4 g/dL (ref 30.0–36.0)
MCV: 75.1 fL — ABNORMAL LOW (ref 80.0–100.0)
Platelets: 249 10*3/uL (ref 150–400)
RBC: 3.82 MIL/uL — ABNORMAL LOW (ref 3.87–5.11)
RDW: 19.1 % — ABNORMAL HIGH (ref 11.5–15.5)
WBC: 8 10*3/uL (ref 4.0–10.5)
nRBC: 0.5 % — ABNORMAL HIGH (ref 0.0–0.2)

## 2019-01-28 LAB — TYPE AND SCREEN
ABO/RH(D): B POS
Antibody Screen: NEGATIVE

## 2019-01-28 MED ORDER — DEXTROSE-NACL 5-0.9 % IV SOLN
INTRAVENOUS | Status: DC
  Administered 2019-01-28: 09:00:00 via INTRAVENOUS

## 2019-01-28 MED ORDER — LISINOPRIL 20 MG PO TABS
20.0000 mg | ORAL_TABLET | Freq: Every day | ORAL | Status: DC
  Administered 2019-01-28 – 2019-01-29 (×2): 20 mg via ORAL
  Filled 2019-01-28 (×2): qty 1

## 2019-01-28 MED ORDER — METOPROLOL TARTRATE 50 MG PO TABS
50.0000 mg | ORAL_TABLET | Freq: Two times a day (BID) | ORAL | Status: DC
  Administered 2019-01-28 – 2019-02-03 (×12): 50 mg via ORAL
  Filled 2019-01-28 (×13): qty 1

## 2019-01-28 MED ORDER — HYDRALAZINE HCL 25 MG PO TABS
25.0000 mg | ORAL_TABLET | Freq: Three times a day (TID) | ORAL | Status: DC
  Administered 2019-01-28 – 2019-02-03 (×15): 25 mg via ORAL
  Filled 2019-01-28 (×16): qty 1

## 2019-01-28 NOTE — Progress Notes (Signed)
PROGRESS NOTE    Casey Black  [id number] DOB: 14-Nov-1960 DOA: 01/23/2019 PCP: Patient, No Pcp Per    Brief Narrative:  58 y.o. female with no known pastmedical history significant(does not see doctors) presenting with abdominal pain.  She reports that her stomach was giving her problems.  When she would eat, it was very painful - couldn't lay down or sit up.  She felt constipated and took 2 Senokot.  About 0100, she developed n/v and was unable to stop and so her daughter brought her in.  The symptoms started maybe 2 months ago but was intermittent.  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  She has had marked unintentional weight loss in the last few months.   ED Course: SBO on Xray. Getting CT scan. Dr. Tomasita Morrow has seen, surgery will follow. NGT in place. IV hydralazine for severe HTN, given Norvasc earlier.   Assessment & Plan:   Active Problems:   SBO (small bowel obstruction) (HCC)   Hypertension   Colonic mass   Tobacco dependence   Hyperglycemia    SBO -Initial xray suggesting of SBO with CT findings worrisome for colonic mass, see below, now s/p colectomy -General Surgery is following. Recommendation for clamping NG. Trial of PO per General Surgery  Colonic mass -Mass in the transverse colon seen on CT abd -GI and General Surgery consulted. Ultimately, decision was made to pursue surgery -Pt now s/p transverse colectomy with end colostomy as well as a liver biopsy on 10/21 -Surgical pathology remain pending -General Surgery following  Hyperglycemia -Suspected stress response from acute illness -A1c noted to be 5.2,  currentlyremains stable  Hypertensive emergency -Presenting SBP into the 200's noted -Currently on lopressor 10mg  IV q6hrs with holding parameters with addition of enalaprat IV. Also continued on scheduled hydralazine and Imdur -BP remains poorly controlled. -Pt tolerating PO meds. Will transition to PO metoprolol  50mg  BID and 20mg  lisinipril in addition to scheduled PO hydralazine and Imdur  Tobacco dependence -Encourage cessation.   -This was discussed with the patient and should be reviewed on an ongoing basis.   -Continued on nicotine patch as tolerated  DVT prophylaxis: Heparin subq Code Status: Full Family Communication: Pt in room, family not at bedside Disposition Plan: Uncertain at this time  Consultants:   GI  General Surgery  Procedures:   Transverse colectomywith end colostomy and liver biopsy 10/21  Antimicrobials: Anti-infectives (From admission, onward)   Start     Dose/Rate Route Frequency Ordered Stop   01/24/19 1030  cefoTEtan (CEFOTAN) 1 g in sodium chloride 0.9 % 100 mL IVPB     1 g 200 mL/hr over 30 Minutes Intravenous On call to O.R. 01/23/19 1344 01/24/19 1146      Subjective: Reports feeling warm this AM  Objective: Vitals:   01/28/19 0218 01/28/19 0226 01/28/19 0504 01/28/19 0511  BP:  (!) 189/81 (!) 187/84 (!) 187/84  Pulse:  91 97   Resp: 18 18 18    Temp:  99.1 F (37.3 C) 98.1 F (36.7 C)   TempSrc:  Oral Oral   SpO2: 98% 98% 96%   Weight:      Height:        Intake/Output Summary (Last 24 hours) at 01/28/2019 0722 Last data filed at 01/28/2019 0501 Gross per 24 hour  Intake 5 ml  Output 950 ml  Net -945 ml   Filed Weights   01/24/19 1036  Weight: 92.1 kg    Examination: General  exam: Conversant, in no acute distress Respiratory system: normal chest rise, clear, no audible wheezing Cardiovascular system: regular rhythm, s1-s2 Gastrointestinal system: Nondistended, nontender, pos BS, NG in place Central nervous system: No seizures, no tremors Extremities: No cyanosis, no joint deformities Skin: No rashes, no pallor Psychiatry: Affect normal // no auditory hallucinations   Data Reviewed: I have personally reviewed following labs and imaging studies  CBC: Recent Labs  Lab 01/23/19 0307 01/24/19 0144 01/25/19 0153 01/27/19  0137 01/28/19 0323  WBC 11.0* 4.8 4.9 6.4 8.0  HGB 12.7 11.2* 11.3* 8.7* 9.0*  HCT 40.2 35.3* 37.3 28.2* 28.7*  MCV 73.2* 74.0* 75.7* 75.8* 75.1*  PLT 299 252 258 211 0000000   Basic Metabolic Panel: Recent Labs  Lab 01/24/19 0144 01/25/19 0153 01/26/19 0349 01/27/19 0137 01/27/19 0807 01/28/19 0323  NA 139 141 140 141  --  144  K 3.9 3.9 3.6 3.5  --  3.3*  CL 105 107 104 104  --  105  CO2 24 25 24 25   --  28  GLUCOSE 94 90 86 86  --  64*  BUN 19 21* 32* 28*  --  20  CREATININE 0.92 0.83 1.02* 0.88  --  0.72  CALCIUM 9.7 8.8* 8.7* 8.8*  --  9.0  MG  --   --  1.5*  --  2.4  --    GFR: Estimated Creatinine Clearance: 89.3 mL/min (by C-G formula based on SCr of 0.72 mg/dL). Liver Function Tests: Recent Labs  Lab 01/23/19 0307 01/27/19 0137 01/28/19 0323  AST 17 28 21   ALT 11 16 16   ALKPHOS 90 52 58  BILITOT 0.4 0.6 0.8  PROT 8.6* 5.9* 6.2*  ALBUMIN 4.2 2.3* 2.3*   Recent Labs  Lab 01/23/19 0307  LIPASE 17   No results for input(s): AMMONIA in the last 168 hours. Coagulation Profile: No results for input(s): INR, PROTIME in the last 168 hours. Cardiac Enzymes: No results for input(s): CKTOTAL, CKMB, CKMBINDEX, TROPONINI in the last 168 hours. BNP (last 3 results) No results for input(s): PROBNP in the last 8760 hours. HbA1C: No results for input(s): HGBA1C in the last 72 hours. CBG: No results for input(s): GLUCAP in the last 168 hours. Lipid Profile: No results for input(s): CHOL, HDL, LDLCALC, TRIG, CHOLHDL, LDLDIRECT in the last 72 hours. Thyroid Function Tests: No results for input(s): TSH, T4TOTAL, FREET4, T3FREE, THYROIDAB in the last 72 hours. Anemia Panel: No results for input(s): VITAMINB12, FOLATE, FERRITIN, TIBC, IRON, RETICCTPCT in the last 72 hours. Sepsis Labs: No results for input(s): PROCALCITON, LATICACIDVEN in the last 168 hours.  Recent Results (from the past 240 hour(s))  SARS CORONAVIRUS 2 (TAT 6-24 HRS) Nasopharyngeal Nasopharyngeal  Swab     Status: None   Collection Time: 01/23/19  7:52 AM   Specimen: Nasopharyngeal Swab  Result Value Ref Range Status   SARS Coronavirus 2 NEGATIVE NEGATIVE Final    Comment: (NOTE) SARS-CoV-2 target nucleic acids are NOT DETECTED. The SARS-CoV-2 RNA is generally detectable in upper and lower respiratory specimens during the acute phase of infection. Negative results do not preclude SARS-CoV-2 infection, do not rule out co-infections with other pathogens, and should not be used as the sole basis for treatment or other patient management decisions. Negative results must be combined with clinical observations, patient history, and epidemiological information. The expected result is Negative. Fact Sheet for Patients: SugarRoll.be Fact Sheet for Healthcare Providers: https://www.woods-mathews.com/ This test is not yet approved or cleared by the [place]  States FDA and  has been authorized for detection and/or diagnosis of SARS-CoV-2 by FDA under an Emergency Use Authorization (EUA). This EUA will remain  in effect (meaning this test can be used) for the duration of the COVID-19 declaration under Section 56 4(b)(1) of the Act, 21 U.S.C. section 360bbb-3(b)(1), unless the authorization is terminated or revoked sooner. Performed at [facility], [place] [address] [place], [place] [address]   Surgical pcr screen     Status: None   Collection Time: 01/23/19  6:51 PM   Specimen: Nasal Mucosa; Nasal Swab  Result Value Ref Range Status   MRSA, PCR NEGATIVE NEGATIVE Final   Staphylococcus aureus NEGATIVE NEGATIVE Final    Comment: (NOTE) The Xpert SA Assay (FDA approved for NASAL specimens in patients 77 years of age and older), is one component of a comprehensive surveillance program. It is not intended to diagnose infection nor to guide or monitor treatment. Performed at [facility], [place] [address], [place], [place] [address]       Radiology Studies: No results found.  Scheduled Meds: . acetaminophen  650 mg Oral Q6H  . heparin  5,000 Units Subcutaneous Q8H  . hydrALAZINE  25 mg Oral Q8H  . isosorbide mononitrate  30 mg Oral Daily  . lisinopril  20 mg Oral Daily  . metoprolol tartrate  50 mg Oral BID  . morphine   Intravenous Q4H  . nicotine  14 mg Transdermal Daily   Continuous Infusions: . dextrose 5 % and 0.9% NaCl       LOS: 5 days   Marylu Lund, MD [facility] Pager On Amion  If 7PM-7AM, please contact night-coverage 01/28/2019, 7:22 AM

## 2019-01-28 NOTE — Plan of Care (Signed)
  Problem: Education: Goal: Required Educational Video(s) Outcome: Progressing

## 2019-01-28 NOTE — Progress Notes (Signed)
   01/28/19 1641  Vitals  Resp (!) 27  Oxygen Therapy  SpO2 97 %  O2 Device Nasal Cannula  O2 Flow Rate (L/min) 1 L/min  End [place] CO2 (EtCO2) 38  Pain Assessment  Pain Score 0  POSS Scale ([place] Opioid Sedation Scale)  POSS *See Group Information* S-Acceptable,Sleep, easy to arouse  PCA/Epidural/Spinal Assessment  Respiratory Pattern Regular;Symmetrical;Dyspnea with exertion  Patient/Family Education Done  MEWS Score  MEWS RR 2  MEWS Pulse 0  MEWS Systolic 0  MEWS LOC 0  MEWS Temp 0  MEWS Score 2  MEWS Score Color Yellow   MD aware of V/S.  Antihypertensives in process of being re-evaluated / re-assessed.  Will monitor closely over next 24 hours.

## 2019-01-28 NOTE — Progress Notes (Signed)
Patient ID: Casey Black, female   DOB: 12-21-1960, 58 y.o.   MRN: [id number]   Acute Care Surgery Service Progress Note:    Chief Complaint/Subjective: Spent time in chair Walked in hall No n/v Pain ok Less NG output - tolerated clamp Remains HTN  Objective: Vital signs in last 24 hours: Temp:  [97.6 F (36.4 C)-99.1 F (37.3 C)] 98.1 F (36.7 C) (10/25 0504) Pulse Rate:  [80-97] 97 (10/25 0504) Resp:  [18-24] 18 (10/25 0504) BP: (174-212)/(61-84) 187/84 (10/25 0511) SpO2:  [95 %-98 %] 96 % (10/25 0504) Last BM Date: 01/27/19  Intake/Output from previous day: 10/24 0701 - 10/25 0700 In: 5 [I.V.:5] Out: 950 [Urine:300; Emesis/NG output:450; Stool:200] Intake/Output this shift: No intake/output data recorded.  Lungs: cta, nonlabored; IS improved to 800-900  Cardiovascular: reg  Abd: soft, mild distension, incision ok. Ostomy - hyperemic but appears viable, some stool in bag  Extremities: no edema, +SCDs  Neuro: alert, nonfocal  Lab Results: CBC  Recent Labs    01/27/19 0137 01/28/19 0323  WBC 6.4 8.0  HGB 8.7* 9.0*  HCT 28.2* 28.7*  PLT 211 249   CBC Latest Ref Rng & Units 01/28/2019 01/27/2019 01/25/2019  WBC 4.0 - 10.5 K/uL 8.0 6.4 4.9  Hemoglobin 12.0 - 15.0 g/dL 9.0(L) 8.7(L) 11.3(L)  Hematocrit 36.0 - 46.0 % 28.7(L) 28.2(L) 37.3  Platelets 150 - 400 K/uL 249 211 258    BMET Recent Labs    01/27/19 0137 01/28/19 0323  NA 141 144  K 3.5 3.3*  CL 104 105  CO2 25 28  GLUCOSE 86 64*  BUN 28* 20  CREATININE 0.88 0.72  CALCIUM 8.8* 9.0   LFT Hepatic Function Latest Ref Rng & Units 01/28/2019 01/27/2019 01/23/2019  Total Protein 6.5 - 8.1 g/dL 6.2(L) 5.9(L) 8.6(H)  Albumin 3.5 - 5.0 g/dL 2.3(L) 2.3(L) 4.2  AST 15 - 41 U/L 21 28 17   ALT 0 - 44 U/L 16 16 11   Alk Phosphatase 38 - 126 U/L 58 52 90  Total Bilirubin 0.3 - 1.2 mg/dL 0.8 0.6 0.4   PT/INR No results for input(s): LABPROT, INR in the last 72 hours. ABG No results for input(s):  PHART, HCO3 in the last 72 hours.  Invalid input(s): PCO2, PO2  Studies/Results:  Anti-infectives: Anti-infectives (From admission, onward)   Start     Dose/Rate Route Frequency Ordered Stop   01/24/19 1030  cefoTEtan (CEFOTAN) 1 g in sodium chloride 0.9 % 100 mL IVPB     1 g 200 mL/hr over 30 Minutes Intravenous On call to O.R. 01/23/19 1344 01/24/19 1146      Medications: Scheduled Meds: . acetaminophen  650 mg Oral Q6H  . heparin  5,000 Units Subcutaneous Q8H  . hydrALAZINE  25 mg Oral Q8H  . isosorbide mononitrate  30 mg Oral Daily  . lisinopril  20 mg Oral Daily  . metoprolol tartrate  50 mg Oral BID  . morphine   Intravenous Q4H  . nicotine  14 mg Transdermal Daily   Continuous Infusions: . dextrose 5 % and 0.9% NaCl     PRN Meds:.diphenhydrAMINE **OR** diphenhydrAMINE, hydrALAZINE, labetalol, naloxone **AND** sodium chloride flush, ondansetron **OR** ondansetron (ZOFRAN) IV, ondansetron (ZOFRAN) IV  Assessment/Plan: Patient Active Problem List   Diagnosis Date Noted  . SBO (small bowel obstruction) ([place]) 01/23/2019  . Hypertension 01/23/2019  . Colonic mass 01/23/2019  . Tobacco dependence 01/23/2019  . 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 Kae Heller, 10/21 - d/c NG tube & allow clears, continue PCA  Acute blood loss anemia - hgb 11.3-->8.7-->9 cont subcu heparin  FEN: start CLD, tylenol for pain VTE: SCD's,heparin ID:pre-op only Foley:DC'd 10/22 Follow up:Dr. Kae Heller  DISPO:IS, ambulate, path pending, ambulate; d/c NG tube, start CLD. If tolerates CLD, will d/c pca on Monday and start oral opioids   LOS: 5 days    Leighton Ruff. Redmond Pulling, MD, FACS General, Bariatric, & Minimally Invasive Surgery [phone] [facility]

## 2019-01-29 LAB — CBC
HCT: 29.2 % — ABNORMAL LOW (ref 36.0–46.0)
Hemoglobin: 9.2 g/dL — ABNORMAL LOW (ref 12.0–15.0)
MCH: 23.3 pg — ABNORMAL LOW (ref 26.0–34.0)
MCHC: 31.5 g/dL (ref 30.0–36.0)
MCV: 73.9 fL — ABNORMAL LOW (ref 80.0–100.0)
Platelets: 276 10*3/uL (ref 150–400)
RBC: 3.95 MIL/uL (ref 3.87–5.11)
RDW: 18.6 % — ABNORMAL HIGH (ref 11.5–15.5)
WBC: 9.7 10*3/uL (ref 4.0–10.5)
nRBC: 0 % (ref 0.0–0.2)

## 2019-01-29 LAB — BASIC METABOLIC PANEL
Anion gap: 11 (ref 5–15)
BUN: 14 mg/dL (ref 6–20)
CO2: 30 mmol/L (ref 22–32)
Calcium: 8.8 mg/dL — ABNORMAL LOW (ref 8.9–10.3)
Chloride: 99 mmol/L (ref 98–111)
Creatinine, Ser: 0.86 mg/dL (ref 0.44–1.00)
GFR calc Af Amer: >60 mL/min (ref >60)
GFR calc non Af Amer: >60 mL/min (ref >60)
Glucose, Bld: 110 mg/dL — ABNORMAL HIGH (ref 70–99)
Potassium: 3.4 mmol/L — ABNORMAL LOW (ref 3.5–5.1)
Sodium: 140 mmol/L (ref 135–145)

## 2019-01-29 MED ORDER — MORPHINE SULFATE (PF) 2 MG/ML IV SOLN
1.0000 mg | INTRAVENOUS | Status: DC | PRN
  Administered 2019-02-02: 1 mg via INTRAVENOUS
  Filled 2019-01-29: qty 1

## 2019-01-29 MED ORDER — OXYCODONE HCL 5 MG PO TABS
5.0000 mg | ORAL_TABLET | ORAL | Status: DC | PRN
  Administered 2019-02-03: 10 mg via ORAL
  Filled 2019-01-29: qty 2

## 2019-01-29 MED ORDER — POTASSIUM CHLORIDE CRYS ER 20 MEQ PO TBCR
40.0000 meq | EXTENDED_RELEASE_TABLET | Freq: Once | ORAL | Status: AC
  Administered 2019-01-29: 40 meq via ORAL
  Filled 2019-01-29: qty 2

## 2019-01-29 MED ORDER — ALUM & MAG HYDROXIDE-SIMETH 200-200-20 MG/5ML PO SUSP
30.0000 mL | ORAL | Status: DC | PRN
  Administered 2019-01-29: 30 mL via ORAL
  Filled 2019-01-29: qty 30

## 2019-01-29 NOTE — Progress Notes (Signed)
PROGRESS NOTE    Casey Black  [id number] DOB: [birth year] DOA: 01/23/2019 PCP: Patient, No Pcp Per    Brief Narrative:  58 y.o. female with no known pastmedical history significant(does not see doctors) presenting with abdominal pain.  She reports that her stomach was giving her problems.  When she would eat, it was very painful - couldn't lay down or sit up.  She felt constipated and took 2 Senokot.  About 0100, she developed n/v and was unable to stop and so her daughter brought her in.  The symptoms started maybe 2 months ago but was intermittent.  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  She has had marked unintentional weight loss in the last few months.   ED Course: SBO on Xray. Getting CT scan. Dr. Tomasita Morrow has seen, surgery will follow. NGT in place. IV hydralazine for severe HTN, given Norvasc earlier.   Assessment & Plan:   Active Problems:   SBO (small bowel obstruction) (HCC)   Hypertension   Colonic mass   Tobacco dependence   Hyperglycemia    SBO -Initial xray suggesting of SBO with CT findings worrisome for colonic mass, see below, now s/p colectomy -General Surgery is following. NG removed and pt is advancing diet as tolerated  Colonic mass -Mass in the transverse colon seen on CT abd -GI and General Surgery consulted. Ultimately, decision was made to pursue surgery -Pt now s/p transverse colectomy with end colostomy as well as a liver biopsy on 10/21 -Surgical pathology remains pending -General Surgery continues to follow  Hyperglycemia -Suspected stress response from acute illness -A1c noted to be 5.2, currently remains stable at this time  Hypertensive emergency -Presenting SBP into the 200's noted -Currently on lopressor 10mg  IV q6hrs with holding parameters with addition of enalaprat IV. Also continued on scheduled hydralazine and Imdur -BP now much better controlled with PO metoprolol 50mg  BID, 20mg  lisinipril in  addition to scheduled PO hydralazine and Imdur  Tobacco dependence -Encourage cessation.   -This was discussed with the patient and should be reviewed on an ongoing basis.   -Pt continuied on nicotine patch as tolerated  DVT prophylaxis: Heparin subq Code Status: Full Family Communication: Pt in room, family not at bedside Disposition Plan: Uncertain at this time  Consultants:   GI  General Surgery  Procedures:   Transverse colectomywith end colostomy and liver biopsy 10/21  Antimicrobials: Anti-infectives (From admission, onward)   Start     Dose/Rate Route Frequency Ordered Stop   01/24/19 1030  cefoTEtan (CEFOTAN) 1 g in sodium chloride 0.9 % 100 mL IVPB     1 g 200 mL/hr over 30 Minutes Intravenous On call to O.R. 01/23/19 1344 01/24/19 1146      Subjective: Without complaints this afternoon. Eager to go home  Objective: Vitals:   01/29/19 0400 01/29/19 0449 01/29/19 0450 01/29/19 1408  BP:  (!) 157/72 (!) 157/72 134/66  Pulse:  82  78  Resp: 18 20  20   Temp:  99.4 F (37.4 C)  99.1 F (37.3 C)  TempSrc:  Oral  Oral  SpO2: 96% 98%  100%  Weight:      Height:        Intake/Output Summary (Last 24 hours) at 01/29/2019 1639 Last data filed at 01/29/2019 1410 Gross per 24 hour  Intake 200 ml  Output 550 ml  Net -350 ml   Filed Weights   01/24/19 1036  Weight: 92.1 kg    Examination:  General exam: Awake, laying in bed, in nad Respiratory system: Normal respiratory effort, no wheezing Cardiovascular system: regular rate, s1, s2 Gastrointestinal system: Soft, nondistended, positive BS Central nervous system: CN2-12 grossly intact, strength intact Extremities: Perfused, no clubbing Skin: Normal skin turgor, no notable skin lesions seen Psychiatry: Mood normal // no visual hallucinations   Data Reviewed: I have personally reviewed following labs and imaging studies  CBC: Recent Labs  Lab 01/24/19 0144 01/25/19 0153 01/27/19 0137 01/28/19 0323  01/29/19 0348  WBC 4.8 4.9 6.4 8.0 9.7  HGB 11.2* 11.3* 8.7* 9.0* 9.2*  HCT 35.3* 37.3 28.2* 28.7* 29.2*  MCV 74.0* 75.7* 75.8* 75.1* 73.9*  PLT 252 258 211 249 [id number]   Basic Metabolic Panel: Recent Labs  Lab 01/25/19 0153 01/26/19 0349 01/27/19 0137 01/27/19 0807 01/28/19 0323 01/29/19 0348  NA 141 140 141  --  144 140  K 3.9 3.6 3.5  --  3.3* 3.4*  CL 107 104 104  --  105 99  CO2 25 24 25   --  28 30  GLUCOSE 90 86 86  --  64* 110*  BUN 21* 32* 28*  --  20 14  CREATININE 0.83 1.02* 0.88  --  0.72 0.86  CALCIUM 8.8* 8.7* 8.8*  --  9.0 8.8*  MG  --  1.5*  --  2.4  --   --    GFR: Estimated Creatinine Clearance: 83.1 mL/min (by C-G formula based on SCr of 0.86 mg/dL). Liver Function Tests: Recent Labs  Lab 01/23/19 0307 01/27/19 0137 01/28/19 0323  AST 17 28 21   ALT 11 16 16   ALKPHOS 90 52 58  BILITOT 0.4 0.6 0.8  PROT 8.6* 5.9* 6.2*  ALBUMIN 4.2 2.3* 2.3*   Recent Labs  Lab 01/23/19 0307  LIPASE 17   No results for input(s): AMMONIA in the last 168 hours. Coagulation Profile: No results for input(s): INR, PROTIME in the last 168 hours. Cardiac Enzymes: No results for input(s): CKTOTAL, CKMB, CKMBINDEX, TROPONINI in the last 168 hours. BNP (last 3 results) No results for input(s): PROBNP in the last 8760 hours. HbA1C: No results for input(s): HGBA1C in the last 72 hours. CBG: No results for input(s): GLUCAP in the last 168 hours. Lipid Profile: No results for input(s): CHOL, HDL, LDLCALC, TRIG, CHOLHDL, LDLDIRECT in the last 72 hours. Thyroid Function Tests: No results for input(s): TSH, T4TOTAL, FREET4, T3FREE, THYROIDAB in the last 72 hours. Anemia Panel: No results for input(s): VITAMINB12, FOLATE, FERRITIN, TIBC, IRON, RETICCTPCT in the last 72 hours. Sepsis Labs: No results for input(s): PROCALCITON, LATICACIDVEN in the last 168 hours.  Recent Results (from the past 240 hour(s))  SARS CORONAVIRUS 2 (TAT 6-24 HRS) Nasopharyngeal Nasopharyngeal Swab      Status: None   Collection Time: 01/23/19  7:52 AM   Specimen: Nasopharyngeal Swab  Result Value Ref Range Status   SARS Coronavirus 2 NEGATIVE NEGATIVE Final    Comment: (NOTE) SARS-CoV-2 target nucleic acids are NOT DETECTED. The SARS-CoV-2 RNA is generally detectable in upper and lower respiratory specimens during the acute phase of infection. Negative results do not preclude SARS-CoV-2 infection, do not rule out co-infections with other pathogens, and should not be used as the sole basis for treatment or other patient management decisions. Negative results must be combined with clinical observations, patient history, and epidemiological information. The expected result is Negative. Fact Sheet for Patients: SugarRoll.be Fact Sheet for Healthcare Providers: https://www.woods-mathews.com/ This test is not yet approved or cleared by the  [place] Architectural technologist and  has been authorized for detection and/or diagnosis of SARS-CoV-2 by FDA under an Print production planner (EUA). This EUA will remain  in effect (meaning this test can be used) for the duration of the COVID-19 declaration under Section 56 4(b)(1) of the Act, 21 U.S.C. section 360bbb-3(b)(1), unless the authorization is terminated or revoked sooner. Performed at [facility], [place] [address], [place], [place] [address]   Surgical pcr screen     Status: None   Collection Time: 01/23/19  6:51 PM   Specimen: Nasal Mucosa; Nasal Swab  Result Value Ref Range Status   MRSA, PCR NEGATIVE NEGATIVE Final   Staphylococcus aureus NEGATIVE NEGATIVE Final    Comment: (NOTE) The Xpert SA Assay (FDA approved for NASAL specimens in patients 32 years of age and older), is one component of a comprehensive surveillance program. It is not intended to diagnose infection nor to guide or monitor treatment. Performed at [facility], [place] [address], [place], [place] [address]       Radiology Studies: No results found.  Scheduled Meds: . acetaminophen  650 mg Oral Q6H  . heparin  5,000 Units Subcutaneous Q8H  . hydrALAZINE  25 mg Oral Q8H  . isosorbide mononitrate  30 mg Oral Daily  . lisinopril  20 mg Oral Daily  . metoprolol tartrate  50 mg Oral BID  . nicotine  14 mg Transdermal Daily   Continuous Infusions:    LOS: 6 days   Marylu Lund, MD [facility] Pager On Amion  If 7PM-7AM, please contact night-coverage 01/29/2019, 4:39 PM

## 2019-01-29 NOTE — TOC Progression Note (Signed)
Transition of Care [facility]) - Progression Note    Patient Details  Name: Casey Black MRN: [id number] Date of Birth: 1960-06-19  [facility]) CM/SW Contact  Jacalyn Lefevre Edson Snowball, RN Phone Number: 01/29/2019, 11:02 AM  Clinical Narrative:     Have found an accepting home health agency . [facility]   Expected Discharge Plan: [place] Barriers to Discharge: Continued Medical Work up  Expected Discharge Plan and Services Expected Discharge Plan: [place]   Discharge Planning Services: CM Consult Post Acute Care Choice: [place] arrangements for the past 2 months: Single Family Home                 DME Arranged: N/A         HH Arranged: RN           Social Determinants of Health (SDOH) Interventions    Readmission Risk Interventions No flowsheet data found.

## 2019-01-29 NOTE — Consult Note (Signed)
[place] Nurse ostomy follow up Patient receiving care in [place]. Stoma type/location: RUQ ileostomy Stomal assessment/size: 2 inches round, red, moist Peristomal assessment: mucocutaneous separation started from 10 - 3 o'clock. Treatment options for stomal/peristomal skin: barrier ring Output:  Liquid brown  Ostomy pouching: 2pc.  Education provided: change process Enrolled patient in [employer] Discharge program: Yes previously Val Riles, RN, MSN, CWOCN, CNS-BC, pager [phone]

## 2019-01-29 NOTE — Progress Notes (Signed)
Wasted Morphine Sulfate 13 mls to stericycle  with nurse Jolayne Panther.

## 2019-01-29 NOTE — Progress Notes (Signed)
[facility] Progress Note  5 Days Post-Op  Subjective: CC-  Slept well last night. Abdominal pain well controlled. Not using PCA very often. Tolerating clear liquids. Ostomy functioning. Denies n/v. Ambulated in the halls yesterday.  Asking when she can go home. Lives with her daughter who plans to help with ostomy.  Objective: Vital signs in last 24 hours: Temp:  [98.2 F (36.8 C)-99.5 F (37.5 C)] 99.4 F (37.4 C) (10/26 0449) Pulse Rate:  [82-100] 82 (10/26 0449) Resp:  [17-27] 20 (10/26 0449) BP: (139-190)/(63-88) 157/72 (10/26 0450) SpO2:  [96 %-100 %] 98 % (10/26 0449) Last BM Date: 01/28/19  Intake/Output from previous day: 10/25 0701 - 10/26 0700 In: 1411.5 [P.O.:1170; I.V.:241.5] Out: 350 [Urine:300; Stool:50] Intake/Output this shift: No intake/output data recorded.  PE: Gen:  Alert, NAD, pleasant HEENT: EOM's intact, pupils equal and round Card:  RRR Pulm:  Rate and effort normal Abd: Soft, mild distension, midline incision cdi with staples intact and no erythema or drainage, ostomy appears viable with stool and air in bag Skin: no rashes noted, warm and dry  Lab Results:  Recent Labs    01/28/19 0323 01/29/19 0348  WBC 8.0 9.7  HGB 9.0* 9.2*  HCT 28.7* 29.2*  PLT 249 276   BMET Recent Labs    01/28/19 0323 01/29/19 0348  NA 144 140  K 3.3* 3.4*  CL 105 99  CO2 28 30  GLUCOSE 64* 110*  BUN 20 14  CREATININE 0.72 0.86  CALCIUM 9.0 8.8*   PT/INR No results for input(s): LABPROT, INR in the last 72 hours. CMP     Component Value Date/Time   NA 140 01/29/2019 0348   K 3.4 (L) 01/29/2019 0348   CL 99 01/29/2019 0348   CO2 30 01/29/2019 0348   GLUCOSE 110 (H) 01/29/2019 0348   BUN 14 01/29/2019 0348   CREATININE 0.86 01/29/2019 0348   CALCIUM 8.8 (L) 01/29/2019 0348   PROT 6.2 (L) 01/28/2019 0323   ALBUMIN 2.3 (L) 01/28/2019 0323   AST 21 01/28/2019 0323   ALT 16 01/28/2019 0323   ALKPHOS 58 01/28/2019 0323   BILITOT  0.8 01/28/2019 0323   GFRNONAA >60 01/29/2019 0348   GFRAA >60 01/29/2019 0348   Lipase     Component Value Date/Time   LIPASE 17 01/23/2019 0307       Studies/Results: No results found.  Anti-infectives: Anti-infectives (From admission, onward)   Start     Dose/Rate Route Frequency Ordered Stop   01/24/19 1030  cefoTEtan (CEFOTAN) 1 g in sodium chloride 0.9 % 100 mL IVPB     1 g 200 mL/hr over 30 Minutes Intravenous On call to O.R. 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 Kae Heller, 10/21 - POD#5 - pathology pending - tolerating clears and colostomy functioning  FEN: SL IV, FLD advance to soft as tolerated VTE: SCD's, sqheparin ID:pre-op only Foley:DC'd 10/22 Follow up:Dr. Kae Heller  DISPO:Advance to full liquids, advance to soft as tolerated. D/c PCA and add oral pain medications. Mobilize. May be ready for discharge tomorrow.   LOS: 6 days    [facility] 01/29/2019, 8:37 AM Please see Amion for pager number during day hours 7:00am-4:30pm

## 2019-01-29 NOTE — Discharge Instructions (Addendum)
Start weaning your imodium as your stool continues to thicken to avoid constipation  Please find a PCP.    [facility], [place] [phone]  OPEN ABDOMINAL SURGERY: POST OP INSTRUCTIONS  Always review your discharge instruction sheet given to you by the facility where your surgery was performed.  IF YOU HAVE DISABILITY OR FAMILY LEAVE FORMS, YOU MUST BRING THEM TO THE OFFICE FOR PROCESSING.  PLEASE DO NOT GIVE THEM TO YOUR DOCTOR.  1. A prescription for pain medication may be given to you upon discharge.  Take your pain medication as prescribed, if needed.  If narcotic pain medicine is not needed, then you may take acetaminophen (Tylenol) or ibuprofen (Advil) as needed. 2. Take your usually prescribed medications unless otherwise directed. 3. If you need a refill on your pain medication, please contact your pharmacy. They will contact our office to request authorization.  Prescriptions will not be filled after 5pm or on week-ends. 4. You should follow a light diet the first few days after arrival home, such as soup and crackers, pudding, etc.unless your doctor has advised otherwise. A high-fiber, low fat diet can be resumed as tolerated.   Be sure to include lots of fluids daily. Most patients will experience some swelling and bruising on the chest and neck area.  Ice packs will help.  Swelling and bruising can take several days to resolve 5. Most patients will experience some swelling and bruising in the area of the incision. Ice pack will help. Swelling and bruising can take several days to resolve..  6. It is common to experience some constipation if taking pain medication after surgery.  Increasing fluid intake and taking a stool softener will usually help or prevent this problem from occurring.  A mild laxative (Milk of Magnesia or Miralax) should be taken according to package directions if there are no bowel movements after 48 hours. 7.  You may have steri-strips (small  skin tapes) in place directly over the incision.  These strips should be left on the skin for 7-10 days.  If your surgeon used skin glue on the incision, you may shower in 24 hours.  The glue will flake off over the next 2-3 weeks.  Any sutures or staples will be removed at the office during your follow-up visit. You may find that a light gauze bandage over your incision may keep your staples from being rubbed or pulled. You may shower and replace the bandage daily. 8. ACTIVITIES:  You may resume regular (light) daily activities beginning the next day--such as daily self-care, walking, climbing stairs--gradually increasing activities as tolerated.  You may have sexual intercourse when it is comfortable.  Refrain from any heavy lifting or straining until approved by your doctor. a. You may drive when you no longer are taking prescription pain medication, you can comfortably wear a seatbelt, and you can safely maneuver your car and apply brakes b. Return to Work: ___________________________________ 34. You should see your doctor in the office for a follow-up appointment approximately two weeks after your surgery.  Make sure that you call for this appointment within a day or two after you arrive home to insure a convenient appointment time. OTHER INSTRUCTIONS:  _____________________________________________________________ _____________________________________________________________  WHEN TO CALL YOUR DOCTOR: 1. Fever over 101.0 2. Inability to urinate 3. Nausea and/or vomiting 4. Extreme swelling or bruising 5. Continued bleeding from incision. 6. Increased pain, redness, or drainage from the incision. 7. Difficulty swallowing or breathing 8. Muscle  cramping or spasms. 9. Numbness or tingling in hands or feet or around lips.  The clinic staff is available to answer your questions during regular business hours.  Please dont hesitate to call and ask to speak to one of the nurses if you have  concerns.  For further questions, please visit [email or web address]     Colostomy Home Guide, Adult  Colostomy surgery is done to create an opening in the front of the abdomen for stool (feces) to leave the body through an ostomy (stoma). Part of the large intestine is attached to the stoma. A bag, also called a pouch, is fitted over the stoma. Stool and gas will collect in the bag. After surgery, you will need to empty and change your colostomy bag as needed. You will also need to care for your stoma. How to care for the stoma Your stoma should look pink, red, and moist, like the inside of your cheek. Soon after surgery, the stoma may be swollen, but this swelling will go away within 6 weeks. To care for the stoma:  Keep the skin around the stoma clean and dry.  Use a clean, soft washcloth to gently wash the stoma and the skin around it. Clean using a circular motion, and wipe away from the stoma opening, not toward it. ? Use warm water and only use cleansers recommended by your health care provider. ? Rinse the stoma area with plain water. ? Dry the area around the stoma well.  Use stoma powder or ointment on your skin only as told by your health care provider. Do not use any other powders, gels, wipes, or creams on the skin around the stoma.  Check the stoma area every day for signs of infection. Check for: ? New or worsening redness, swelling, or pain. ? New or increased fluid or blood. ? Pus or warmth.  Measure the stoma opening regularly and record the size. Watch for changes. (It is normal for the stoma to get smaller as swelling goes away.) Share this information with your health care provider. How to empty the colostomy bag  Empty your bag at bedtime and whenever it is one-third to one-half full. Do not let the bag get more than half-full with stool or gas. The bag could leak if it gets too full. Some colostomy bags have a built-in gas release valve that releases gas  often throughout the day. Follow these basic steps: 1. Wash your hands with soap and water. 2. Sit far back on the toilet seat. 3. Put several pieces of toilet paper into the toilet water. This will prevent splashing as you empty stool into the toilet. 4. Remove the clip or the hook-and-loop fastener from the tail end of the bag. 5. Unroll the tail, then empty the stool into the toilet. 6. Clean the tail with toilet paper or a moist towelette. 7. Reroll the tail, and close it with the clip or the hook-and-loop fastener. 8. Wash your hands again. How to change the colostomy bag Change your bag every 3-4 days or as often as told by your health care provider. Also change the bag if it is leaking or separating from the skin, or if your skin around the stoma looks or feels irritated. Irritated skin may be a sign that the bag is leaking. Always have colostomy supplies with you, and follow these basic steps: 1. Wash your hands with soap and water. Have paper towels or tissues nearby to clean any discharge. 2. Remove the old  bag and skin barrier. Use your fingers or a warm cloth to gently push the skin away from the barrier. 3. Clean the stoma area with water or with mild soap and water, as directed. Use water to rinse away any soap. 4. Dry the skin. You may use the cool setting on a hair dryer to do this. 5. Use a tracing pattern (template) to cut the skin barrier to the size needed. 6. If you are using a two-piece bag, attach the bag and the skin barrier to each other. Add the barrier ring, if you use one. 7. If directed, apply stoma powder or skin barrier gel to the skin. 8. Warm the skin barrier with your hands, or blow with a hair dryer for 5-10 seconds. 9. Remove the paper from the adhesive strip of the skin barrier. 10. Press the adhesive strip onto the skin around the stoma. 11. Gently rub the skin barrier onto the skin. This creates heat that helps the barrier to stick. 12. Apply stoma tape  to the edges of the skin barrier, if desired. 45. Wash your hands again. General recommendations  Avoid wearing tight clothes or having anything press directly on your stoma or bag. Change your clothing whenever it is soiled or damp.  You may shower or bathe with the bag on or off. Do not use harsh or oily soaps or lotions. Dry the skin and bag after bathing.  Store all supplies in a cool, dry place. Do not leave supplies in extreme heat because some parts can melt or not stick as well.  Whenever you leave home, take extra clothing and an extra skin barrier and bag with you.  If your bag gets wet, you can dry it with a hair dryer on the cool setting.  To prevent odor, you may put drops of ostomy deodorizer in the bag.  If recommended by your health care provider, put ostomy lubricant inside the bag. This helps stool to slide out of the bag more easily and completely. Contact a health care provider if:  You have new or worsening redness, swelling, or pain around your stoma.  You have new or increased fluid or blood coming from your stoma.  Your stoma feels warm to the touch.  You have pus coming from your stoma.  Your stoma extends in or out farther than normal.  You need to change your bag every day.  You have a fever. Get help right away if:  Your stool is bloody.  You have nausea or you vomit.  You have trouble breathing. Summary  Measure your stoma opening regularly and record the size. Watch for changes.  Empty your bag at bedtime and whenever it is one-third to one-half full. Do not let the bag get more than half-full with stool or gas.  Change your bag every 3-4 days or as often as told by your health care provider.  Whenever you leave home, take extra clothing and an extra skin barrier and bag with you. This information is not intended to replace advice given to you by your health care provider. Make sure you discuss any questions you have with your health care  provider. Document Released: 03/25/2003 Document Revised: 07/12/2018 Document Reviewed: 09/15/2016 Elsevier Patient Education  [place].   Negative Pressure Wound Therapy Home Guide Negative pressure wound therapy (NPWT) uses a sponge or foam-like material (dressing) placed on or inside the wound. The wound is then covered and sealed with a cover dressing that sticks  to your skin (is adhesive). This keeps air out. A tube is attached to the cover dressing, and this tube connects to a small pump. The pump sucks fluid and germs from the wound. NPWT helps to increase blood flow to the wound and heal it from the inside. What are the risks? NPWT is usually safe to use. However, problems can occur, including:  Skin irritation from the dressing adhesive.  Bleeding.  Infection.  Dehydration. Wounds with large amounts of drainage can cause excessive fluid loss.  Pain. Supplies needed:  A disposable garbage bag.  Soap and water, or hand sanitizer.  Wound cleanser or salt-water solution (saline).  New sponge and cover dressing.  Protective clothing.  Gauze pad.  Vinyl gloves.  Tape.  Skin protectant. This may be a wipe, film, or spray.  Clean or germ-free (sterile) scissors.  Eye protection. How to change your dressing Prepare to change your dressing  14. If told by your health care provider, take pain medicine 30 minutes before changing the dressing. 15. Wash your hands with soap and water. Dry your hands with a clean towel. If soap and water are not available, use hand sanitizer. 16. Set up a clean station for wound care. 17. Open the dressing package so that the sponge dressing remains on the inside of the package. 18. Wear gloves, protective clothing, and eye protection. Remove old dressing  1. Turn off the pump and disconnect the tubing from the dressing. 2. Carefully remove the adhesive cover dressing in the direction of your hair growth. 3. Remove the sponge  dressing that is inside the wound. If the sponge sticks, use a wound cleanser or saline solution to wet the sponge and help it come off more easily. 4. Throw the old sponge and cover dressing supplies into the garbage bag. 5. Remove your gloves by grabbing the cuff and turning the glove inside out. Place the gloves in the trash immediately. 6. Wash your hands with soap and water. Dry your hands with a clean towel. If soap and water are not available, use hand sanitizer. Clean your wound  Wear gloves, protective clothing, and eye protection. Follow your health care provider's instructions on how to clean your wound. You may be told to: 1. Clean the wound using a saline solution or a wound cleanser and a clean gauze pad. 2. Pat the wound dry with a gauze pad. Do not rub the wound. 3. Throw the gauze pad into the garbage bag. 4. Remove your gloves by grabbing the cuff and turning the glove inside out. Place the gloves in the trash immediately. 5. Wash your hands with soap and water. Dry your hands with a clean towel. If soap and water are not available, use hand sanitizer. Apply new dressing  Wear gloves, protective clothing, and eye protection. 1. If told by your health care provider, apply a skin protectant to any skin that will be exposed to adhesive. Let the skin protectant dry. 2. Cut a piece of new sponge dressing and put it on or in the wound. 3. Using clean scissors, cut a nickel-sized hole in the new cover dressing. 4. Apply the cover dressing. 5. Attach the suction tube over the hole in the cover dressing. 6. Take off your gloves. Put them in the plastic bag with the old dressing. Tie the bag shut and throw it away. 7. Wash your hands with soap and water. Dry your hands with a clean towel. If soap and water are not available, use  hand sanitizer. 8. Turn the pump back on. The sponge dressing should collapse. Do not change the settings on the machine without talking to a health care  provider. 9. Replace the container in the pump that collects fluid if it is full. Replace the container per the manufacturer's instructions or at least once a week, even if it is not full. General tips and recommendations If the alarm sounds:  Stay calm.  Do not turn off the pump or do anything with the dressing.  Reasons the alarm may go off: ? The battery is low. Change the battery or plug the device into electrical power. ? The dressing has a leak. Find the leak and put tape over the leak. ? The fluid collection container is full. Change the fluid container.  Call your health care provider right away if you cannot fix the problem.  Explain to your health care provider what is happening. Follow his or her instructions. General instructions  Do not turn off the pump unless told to do so by your health care provider.  Do not turn off the pump for more than 2 hours. If the pump is off for more than 2 hours, the dressing will need to be changed.  If your health care provider says it is okay to shower: ? Do not take the pump into the shower. ? Make sure the wound dressing is protected and sealed. The wound dressing must stay dry.  Check frequently that the machine indicates that therapy is on and that all clamps are open.  Do not use over-the-counter medicated or antiseptic creams, sprays, liquids, or dressings unless your health care provider approves. Contact a health care provider if:  You have new pain.  You develop irritation, a rash, or itching around the wound or dressing.  You see new black or yellow tissue in your wound.  The dressing changes are painful or cause bleeding.  The pump has been off for more than 2 hours, and you do not know how to change the dressing.  The pump alarm goes off, and you do not know what to do. Get help right away if:  You have a lot of bleeding.  The wound breaks open.  You have severe pain.  You have signs of infection, such  as: ? More redness, swelling, or pain. ? More fluid or blood. ? Warmth. ? Pus or a bad smell. ? Red streaks leading from the wound. ? A fever.  You see a sudden change in the color or texture of the drainage.  You have signs of dehydration, such as: ? Little or no tears, urine, or sweat. ? Muscle cramps. ? Very dry mouth. ? Headache. ? Dizziness. Summary  Negative pressure wound therapy (NPWT) is a device that helps your wound heal.  Set up a clean station for wound care. Your health care provider will tell you what supplies to use.  Follow your health care provider's instructions on how to clean your wound and how to change the dressing.  Contact a health care provider if you have new pain, an irritation, or a rash, or if the alarm goes off and you do not know what to do.  Get help right away if you have a lot of bleeding, your wound breaks open, or you have severe pain. Also, get help if you have signs of infection. This information is not intended to replace advice given to you by your health care provider. Make sure you discuss any questions  you have with your health care provider. Document Released: 06/14/2011 Document Revised: 07/14/2018 Document Reviewed: 06/09/2018 Elsevier Patient Education  [place] DASH stands for "Dietary Approaches to Stop Hypertension." The DASH eating plan is a healthy eating plan that has been shown to reduce high blood pressure (hypertension). It may also reduce your risk for type 2 diabetes, heart disease, and stroke. The DASH eating plan may also help with weight loss. What are tips for following this plan?  General guidelines  Avoid eating more than 2,300 mg (milligrams) of salt (sodium) a day. If you have hypertension, you may need to reduce your sodium intake to 1,500 mg a day.  Limit alcohol intake to no more than 1 drink a day for nonpregnant women and 2 drinks a day for men. One drink equals 12 oz of beer, 5  oz of wine, or 1 oz of hard liquor.  Work with your health care provider to maintain a healthy body weight or to lose weight. Ask what an ideal weight is for you.  Get at least 30 minutes of exercise that causes your heart to beat faster (aerobic exercise) most days of the week. Activities may include walking, swimming, or biking.  Work with your health care provider or diet and nutrition specialist (dietitian) to adjust your eating plan to your individual calorie needs. Reading food labels   Check food labels for the amount of sodium per serving. Choose foods with less than 5 percent of the Daily Value of sodium. Generally, foods with less than 300 mg of sodium per serving fit into this eating plan.  To find whole grains, look for the word "whole" as the first word in the ingredient list. Shopping  Buy products labeled as "low-sodium" or "no salt added."  Buy fresh foods. Avoid canned foods and premade or frozen meals. Cooking  Avoid adding salt when cooking. Use salt-free seasonings or herbs instead of table salt or sea salt. Check with your health care provider or pharmacist before using salt substitutes.  Do not fry foods. Cook foods using healthy methods such as baking, boiling, grilling, and broiling instead.  Cook with heart-healthy oils, such as olive, canola, soybean, or sunflower oil. Meal planning  Eat a balanced diet that includes: ? 5 or more servings of fruits and vegetables each day. At each meal, try to fill half of your plate with fruits and vegetables. ? Up to 6-8 servings of whole grains each day. ? Less than 6 oz of lean meat, poultry, or fish each day. A 3-oz serving of meat is about the same size as a deck of cards. One egg equals 1 oz. ? 2 servings of low-fat dairy each day. ? A serving of nuts, seeds, or beans 5 times each week. ? Heart-healthy fats. Healthy fats called Omega-3 fatty acids are found in foods such as flaxseeds and coldwater fish, like  sardines, salmon, and mackerel.  Limit how much you eat of the following: ? Canned or prepackaged foods. ? Food that is high in trans fat, such as fried foods. ? Food that is high in saturated fat, such as fatty meat. ? Sweets, desserts, sugary drinks, and other foods with added sugar. ? Full-fat dairy products.  Do not salt foods before eating.  Try to eat at least 2 vegetarian meals each week.  Eat more home-cooked food and less restaurant, buffet, and fast food.  When eating at a restaurant, ask that your food be prepared with  less salt or no salt, if possible. What foods are recommended? The items listed may not be a complete list. Talk with your dietitian about what dietary choices are best for you. Grains Whole-grain or whole-wheat bread. Whole-grain or whole-wheat pasta. Brown rice. [place]. Bulgur. Whole-grain and low-sodium cereals. Pita bread. Low-fat, low-sodium crackers. Whole-wheat flour tortillas. Vegetables Fresh or frozen vegetables (raw, steamed, roasted, or grilled). Low-sodium or reduced-sodium tomato and vegetable juice. Low-sodium or reduced-sodium tomato sauce and tomato paste. Low-sodium or reduced-sodium canned vegetables. Fruits All fresh, dried, or frozen fruit. Canned fruit in natural juice (without added sugar). Meat and other protein foods Skinless chicken or [place]. Ground chicken or [place]. Pork with fat trimmed off. Fish and seafood. Egg whites. Dried beans, peas, or lentils. Unsalted nuts, nut butters, and seeds. Unsalted canned beans. Lean cuts of beef with fat trimmed off. Low-sodium, lean deli meat. Dairy Low-fat (1%) or fat-free (skim) milk. Fat-free, low-fat, or reduced-fat cheeses. Nonfat, low-sodium ricotta or cottage cheese. Low-fat or nonfat yogurt. Low-fat, low-sodium cheese. Fats and oils Soft margarine without trans fats. Vegetable oil. Low-fat, reduced-fat, or light mayonnaise and salad dressings (reduced-sodium). Canola, safflower,  olive, soybean, and sunflower oils. Avocado. Seasoning and other foods Herbs. Spices. Seasoning mixes without salt. Unsalted popcorn and pretzels. Fat-free sweets. What foods are not recommended? The items listed may not be a complete list. Talk with your dietitian about what dietary choices are best for you. Grains Baked goods made with fat, such as croissants, muffins, or some breads. Dry pasta or rice meal packs. Vegetables Creamed or fried vegetables. Vegetables in a cheese sauce. Regular canned vegetables (not low-sodium or reduced-sodium). Regular canned tomato sauce and paste (not low-sodium or reduced-sodium). Regular tomato and vegetable juice (not low-sodium or reduced-sodium). Angie Fava. Olives. Fruits Canned fruit in a light or heavy syrup. Fried fruit. Fruit in cream or butter sauce. Meat and other protein foods Fatty cuts of meat. Ribs. Fried meat. Berniece Salines. Sausage. Bologna and other processed lunch meats. Salami. Fatback. Hotdogs. Bratwurst. Salted nuts and seeds. Canned beans with added salt. Canned or smoked fish. Whole eggs or egg yolks. Chicken or [place] with skin. Dairy Whole or 2% milk, cream, and half-and-half. Whole or full-fat cream cheese. Whole-fat or sweetened yogurt. Full-fat cheese. Nondairy creamers. Whipped toppings. Processed cheese and cheese spreads. Fats and oils Butter. Stick margarine. Lard. Shortening. Ghee. Bacon fat. Tropical oils, such as coconut, palm kernel, or palm oil. Seasoning and other foods Salted popcorn and pretzels. Onion salt, garlic salt, seasoned salt, table salt, and sea salt. Worcestershire sauce. Tartar sauce. Barbecue sauce. Teriyaki sauce. Soy sauce, including reduced-sodium. Steak sauce. Canned and packaged gravies. Fish sauce. Oyster sauce. Cocktail sauce. Horseradish that you find on the shelf. Ketchup. Mustard. Meat flavorings and tenderizers. Bouillon cubes. Hot sauce and Tabasco sauce. Premade or packaged marinades. Premade or packaged  taco seasonings. Relishes. Regular salad dressings. Where to find more information:  National Heart, Lung, and [place]: https://wilson-eaton.com/  American Heart Association: www.heart.org Summary  The DASH eating plan is a healthy eating plan that has been shown to reduce high blood pressure (hypertension). It may also reduce your risk for type 2 diabetes, heart disease, and stroke.  With the DASH eating plan, you should limit salt (sodium) intake to 2,300 mg a day. If you have hypertension, you may need to reduce your sodium intake to 1,500 mg a day.  When on the DASH eating plan, aim to eat more fresh fruits and vegetables, whole grains, lean proteins,  low-fat dairy, and heart-healthy fats.  Work with your health care provider or diet and nutrition specialist (dietitian) to adjust your eating plan to your individual calorie needs. This information is not intended to replace advice given to you by your health care provider. Make sure you discuss any questions you have with your health care provider. Document Released: 03/11/2011 Document Revised: 03/04/2017 Document Reviewed: 03/15/2016 Elsevier Patient Education  2020 [place] were cared for by a hospitalist during your hospital stay. If you have any questions about your discharge medications or the care you received while you were in the hospital after you are discharged, you can call the unit and asked to speak with the hospitalist on call if the hospitalist that took care of you is not available. Once you are discharged, your primary care physician will handle any further medical issues.   Please note that NO REFILLS for any discharge medications will be authorized once you are discharged, as it is imperative that you return to your primary care physician (or establish a relationship with a primary care physician if you do not have one) for your aftercare needs so that they can reassess your need for medications and monitor your  lab values.  Please take all your medications with you for your next visit with your Primary MD. Please ask your Primary MD to get all Hospital records sent to his/her office. Please request your Primary MD to go over all hospital test results at the follow up.   If you experience worsening of your admission symptoms, develop shortness of breath, chest pain, suicidal or homicidal thoughts or a life threatening emergency, you must seek medical attention immediately by calling 911 or calling your MD.   Dennis Bast must read the complete instructions/literature along with all the possible adverse reactions/side effects for all the medicines you take including new medications that have been prescribed to you. Take new medicines after you have completely understood and accpet all the possible adverse reactions/side effects.    Do not drive when taking pain medications or sedatives.     Do not take more than prescribed Pain, Sleep and Anxiety Medications   If you have smoked or chewed Tobacco in the last 2 yrs please stop. Stop any regular alcohol  and or recreational drug use.   Wear Seat belts while driving.

## 2019-01-30 LAB — BASIC METABOLIC PANEL
Anion gap: 13 (ref 5–15)
BUN: 18 mg/dL (ref 6–20)
CO2: 32 mmol/L (ref 22–32)
Calcium: 9.3 mg/dL (ref 8.9–10.3)
Chloride: 96 mmol/L — ABNORMAL LOW (ref 98–111)
Creatinine, Ser: 0.86 mg/dL (ref 0.44–1.00)
GFR calc Af Amer: >60 mL/min (ref >60)
GFR calc non Af Amer: >60 mL/min (ref >60)
Glucose, Bld: 96 mg/dL (ref 70–99)
Potassium: 3.6 mmol/L (ref 3.5–5.1)
Sodium: 141 mmol/L (ref 135–145)

## 2019-01-30 MED ORDER — IPRATROPIUM-ALBUTEROL 0.5-2.5 (3) MG/3ML IN SOLN
3.0000 mL | RESPIRATORY_TRACT | Status: DC | PRN
  Administered 2019-01-30: 3 mL via RESPIRATORY_TRACT
  Filled 2019-01-30: qty 3

## 2019-01-30 MED ORDER — LISINOPRIL 40 MG PO TABS
40.0000 mg | ORAL_TABLET | Freq: Every day | ORAL | Status: DC
  Administered 2019-01-30 – 2019-02-03 (×5): 40 mg via ORAL
  Filled 2019-01-30 (×5): qty 1

## 2019-01-30 NOTE — TOC Progression Note (Addendum)
Transition of Care [facility]) - Progression Note    Patient Details  Name: Casey Black MRN: [id number] Date of Birth: 01-29-1961  Transition of Care [facility]) CM/SW Contact  Jacalyn Lefevre Edson Snowball, RN Phone Number: 01/30/2019, 8:49 AM  Clinical Narrative:    [id number] VAC has been approved.  Plan to place wound VAC on 01-31-19 and potential discharge to home on 01-31-19 .   VAC application signed by PA and faxed to [facility] with [employer].   Called [name] with [facility] to update , she will check to be sure they can provide Monday , Wednesday, and Friday VAC dressing changes. [facility] can provide VAC dressing changes.  Ordered walker and 3 in1 from Zack with [facility]   Expected Discharge Plan: [place] Barriers to Discharge: Continued Medical Work up  Expected Discharge Plan and Services Expected Discharge Plan: [place]   Discharge Planning Services: CM Consult Post Acute Care Choice: [place] arrangements for the past 2 months: Single Family Home                 DME Arranged: N/A         HH Arranged: RN           Social Determinants of Health (SDOH) Interventions    Readmission Risk Interventions No flowsheet data found.

## 2019-01-30 NOTE — Progress Notes (Signed)
PROGRESS NOTE    Casey Black  [id number] DOB: 1960-10-02 DOA: 01/23/2019 PCP: Patient, No Pcp Per    Brief Narrative:  58 y.o. female with no known pastmedical history significant(does not see doctors) presenting with abdominal pain.  She reports that her stomach was giving her problems.  When she would eat, it was very painful - couldn't lay down or sit up.  She felt constipated and took 2 Senokot.  About 0100, she developed n/v and was unable to stop and so her daughter brought her in.  The symptoms started maybe 2 months ago but was intermittent.  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  She has had marked unintentional weight loss in the last few months.   ED Course: SBO on Xray. Getting CT scan. Dr. Tomasita Morrow has seen, surgery will follow. NGT in place. IV hydralazine for severe HTN, given Norvasc earlier.   Assessment & Plan:   Active Problems:   SBO (small bowel obstruction) (HCC)   Hypertension   Colonic mass   Tobacco dependence   Hyperglycemia    SBO -Initial xray suggesting of SBO with CT findings worrisome for colonic mass, see below, now s/p colectomy -General Surgery is following. Tolerating diet however pt had been reporting some nausea -Concerns for developing wound infection with recommendation for wound VAC  Colonic mass -Mass in the transverse colon seen on CT abd -GI and General Surgery consulted. Ultimately, decision was made to pursue surgery -Pt now s/p transverse colectomy with end colostomy as well as a liver biopsy on 10/21 -Surgical pathology still pending -General Surgery continues to follow  Hyperglycemia -Suspected stress response from acute illness -A1c noted to be 5.2, currently remains stable at this time  Hypertensive emergency -Presenting SBP into the 200's noted -Currently on lopressor 10mg  IV q6hrs with holding parameters with addition of enalaprat IV. Also continued on scheduled hydralazine and Imdur  -Currently on PO metoprolol 50mg  BID, 20mg  lisinipril in addition to scheduled PO hydralazine and Imdur -Will continue current regimen, cont to titrate as needed  Tobacco dependence -Encourage cessation.   -This was discussed with the patient and should be reviewed on an ongoing basis.   -Pt continuied on nicotine patch as tolerated  DVT prophylaxis: Heparin subq Code Status: Full Family Communication: Pt in room, family not at bedside Disposition Plan: Uncertain at this time  Consultants:   GI  General Surgery  Procedures:   Transverse colectomywith end colostomy and liver biopsy 10/21  Antimicrobials: Anti-infectives (From admission, onward)   Start     Dose/Rate Route Frequency Ordered Stop   01/24/19 1030  cefoTEtan (CEFOTAN) 1 g in sodium chloride 0.9 % 100 mL IVPB     1 g 200 mL/hr over 30 Minutes Intravenous On call to O.R. 01/23/19 1344 01/24/19 1146      Subjective: Reports feeling tired this AM  Objective: Vitals:   01/29/19 2033 01/30/19 0428 01/30/19 0537 01/30/19 1341  BP: (!) 164/77 (!) 196/87 (!) 169/75   Pulse: 87 99    Resp: 18 (!) 22 20   Temp: 98.4 F (36.9 C) 99.5 F (37.5 C)    TempSrc: Oral Oral    SpO2: 93% 91%  94%  Weight:      Height:        Intake/Output Summary (Last 24 hours) at 01/30/2019 1513 Last data filed at 01/30/2019 1110 Gross per 24 hour  Intake 120 ml  Output 1850 ml  Net -1730 ml  Filed Weights   01/24/19 1036  Weight: 92.1 kg    Examination: General exam: Conversant, in no acute distress Respiratory system: normal chest rise, clear, no audible wheezing Cardiovascular system: regular rhythm, s1-s2 Gastrointestinal system: Nondistended, nontender, pos BS Central nervous system: No seizures, no tremors Extremities: No cyanosis, no joint deformities Skin: No rashes, no pallor Psychiatry: Affect normal // no auditory hallucinations   Data Reviewed: I have personally reviewed following labs and imaging  studies  CBC: Recent Labs  Lab 01/24/19 0144 01/25/19 0153 01/27/19 0137 01/28/19 0323 01/29/19 0348  WBC 4.8 4.9 6.4 8.0 9.7  HGB 11.2* 11.3* 8.7* 9.0* 9.2*  HCT 35.3* 37.3 28.2* 28.7* 29.2*  MCV 74.0* 75.7* 75.8* 75.1* 73.9*  PLT 252 258 211 249 [id number]   Basic Metabolic Panel: Recent Labs  Lab 01/26/19 0349 01/27/19 0137 01/27/19 0807 01/28/19 0323 01/29/19 0348 01/30/19 0133  NA 140 141  --  144 140 141  K 3.6 3.5  --  3.3* 3.4* 3.6  CL 104 104  --  105 99 96*  CO2 24 25  --  28 30 32  GLUCOSE 86 86  --  64* 110* 96  BUN 32* 28*  --  20 14 18   CREATININE 1.02* 0.88  --  0.72 0.86 0.86  CALCIUM 8.7* 8.8*  --  9.0 8.8* 9.3  MG 1.5*  --  2.4  --   --   --    GFR: Estimated Creatinine Clearance: 83.1 mL/min (by C-G formula based on SCr of 0.86 mg/dL). Liver Function Tests: Recent Labs  Lab 01/27/19 0137 01/28/19 0323  AST 28 21  ALT 16 16  ALKPHOS 52 58  BILITOT 0.6 0.8  PROT 5.9* 6.2*  ALBUMIN 2.3* 2.3*   No results for input(s): LIPASE, AMYLASE in the last 168 hours. No results for input(s): AMMONIA in the last 168 hours. Coagulation Profile: No results for input(s): INR, PROTIME in the last 168 hours. Cardiac Enzymes: No results for input(s): CKTOTAL, CKMB, CKMBINDEX, TROPONINI in the last 168 hours. BNP (last 3 results) No results for input(s): PROBNP in the last 8760 hours. HbA1C: No results for input(s): HGBA1C in the last 72 hours. CBG: No results for input(s): GLUCAP in the last 168 hours. Lipid Profile: No results for input(s): CHOL, HDL, LDLCALC, TRIG, CHOLHDL, LDLDIRECT in the last 72 hours. Thyroid Function Tests: No results for input(s): TSH, T4TOTAL, FREET4, T3FREE, THYROIDAB in the last 72 hours. Anemia Panel: No results for input(s): VITAMINB12, FOLATE, FERRITIN, TIBC, IRON, RETICCTPCT in the last 72 hours. Sepsis Labs: No results for input(s): PROCALCITON, LATICACIDVEN in the last 168 hours.  Recent Results (from the past 240 hour(s))   SARS CORONAVIRUS 2 (TAT 6-24 HRS) Nasopharyngeal Nasopharyngeal Swab     Status: None   Collection Time: 01/23/19  7:52 AM   Specimen: Nasopharyngeal Swab  Result Value Ref Range Status   SARS Coronavirus 2 NEGATIVE NEGATIVE Final    Comment: (NOTE) SARS-CoV-2 target nucleic acids are NOT DETECTED. The SARS-CoV-2 RNA is generally detectable in upper and lower respiratory specimens during the acute phase of infection. Negative results do not preclude SARS-CoV-2 infection, do not rule out co-infections with other pathogens, and should not be used as the sole basis for treatment or other patient management decisions. Negative results must be combined with clinical observations, patient history, and epidemiological information. The expected result is Negative. Fact Sheet for Patients: SugarRoll.be Fact Sheet for Healthcare Providers: https://www.woods-mathews.com/ This test is not yet approved or  cleared by the [place] and  has been authorized for detection and/or diagnosis of SARS-CoV-2 by FDA under an Emergency Use Authorization (EUA). This EUA will remain  in effect (meaning this test can be used) for the duration of the COVID-19 declaration under Section 56 4(b)(1) of the Act, 21 U.S.C. section 360bbb-3(b)(1), unless the authorization is terminated or revoked sooner. Performed at [facility], [place] [address] [place], [place] [address]   Surgical pcr screen     Status: None   Collection Time: 01/23/19  6:51 PM   Specimen: Nasal Mucosa; Nasal Swab  Result Value Ref Range Status   MRSA, PCR NEGATIVE NEGATIVE Final   Staphylococcus aureus NEGATIVE NEGATIVE Final    Comment: (NOTE) The Xpert SA Assay (FDA approved for NASAL specimens in patients 73 years of age and older), is one component of a comprehensive surveillance program. It is not intended to diagnose infection nor to guide or monitor treatment. Performed at [facility], [place] [address] [place], [place] [address]      Radiology Studies: No results found.  Scheduled Meds: . acetaminophen  650 mg Oral Q6H  . heparin  5,000 Units Subcutaneous Q8H  . hydrALAZINE  25 mg Oral Q8H  . isosorbide mononitrate  30 mg Oral Daily  . lisinopril  40 mg Oral Daily  . metoprolol tartrate  50 mg Oral BID  . nicotine  14 mg Transdermal Daily   Continuous Infusions:    LOS: 7 days   Marylu Lund, MD [facility] Pager On Amion  If 7PM-7AM, please contact night-coverage 01/30/2019, 3:13 PM

## 2019-01-30 NOTE — Evaluation (Signed)
Physical Therapy Evaluation Patient Details Name: Casey Black MRN: [id number] DOB: [birth year] Today's Date: 01/30/2019   History of Present Illness  Pt is a 58 yo female presenting with a small bowel obstruction (colonic mass with likely kidney mets) and s/p colostomy with liver biopsy on 10/19. No known PMH as pt reportedly avoids doctors.  Clinical Impression  Pt found in bed upon arrival of PT, pleasant and agreeable to PT session. Pt was able to demonstrate good bed mobility, even without elevated HOB, requiring only supervision and extra time to come to sitting. Furthermore, the pt was able to demonstrate safe use of RW and ambulation without LOB, but demos sig slowed gait speed (0.24m/s) which is indicative of increased risk of falls and dependence for ADLs. Therefore, the pt will continue to benefit from skilled PT both in house, and following d/c to address functional mobility and endurance.     Follow Up Recommendations Home health PT;Supervision/Assistance - 24 hour    Equipment Recommendations  Rolling walker with 5" wheels;3in1 (PT)    Recommendations for Other Services       Precautions / Restrictions Precautions Precautions: None;Fall Precaution Comments: colostomy RUQ Restrictions Weight Bearing Restrictions: No      Mobility  Bed Mobility Overal bed mobility: Modified Independent             General bed mobility comments: Pt benefitted from elevated HOB and extra time, was able to complete sup-sit without elevated HOB with VCs for technique  Transfers Overall transfer level: Needs assistance Equipment used: Rolling walker (2 wheeled) Transfers: Sit to/from Stand Sit to Stand: Supervision         General transfer comment: Pt needed min VCs for positioning of RW and hand placement, but was able to stand without physical assist, only supervision for safety  Ambulation/Gait Ambulation/Gait assistance: Supervision Gait Distance (Feet): 60  Feet Assistive device: Rolling walker (2 wheeled) Gait Pattern/deviations: Step-through pattern Gait velocity: 0.16 m/s Gait velocity interpretation: <1.8 ft/sec, indicate of risk for recurrent falls General Gait Details: Pt ambulates with step-through pattern but sig decreased gait speed that pt reports is slower than her baseline and  due to feeling less stable than normal.  Stairs            Wheelchair Mobility    Modified Rankin (Stroke Patients Only)       Balance Overall balance assessment: Needs assistance Sitting-balance support: Feet supported Sitting balance-Leahy Scale: Good     Standing balance support: Bilateral upper extremity supported Standing balance-Leahy Scale: Poor Standing balance comment: Pt able to stand without BUE support, needs support for mobility                             Pertinent Vitals/Pain Pain Assessment: No/denies pain    Home Living Family/patient expects to be discharged to:: Private residence Living Arrangements: Children(oldest daughter, works during day (works from home)) Available Help at Discharge: Family;Friend(s)(Daughter works from home currently, available. Sisters live nearby and can visit during the day) Type of Home: Apartment Home Access: Level entry     Home Layout: One level Home Equipment: None      Prior Function Level of Independence: Independent         Comments: Pt reports she was independent and driving prior to hospitalization, she and daughter split IADLs, ind with ADLs     Hand Dominance   Dominant Hand: Right    Extremity/Trunk Assessment  Upper Extremity Assessment Upper Extremity Assessment: Overall WFL for tasks assessed    Lower Extremity Assessment Lower Extremity Assessment: Overall WFL for tasks assessed    Cervical / Trunk Assessment Cervical / Trunk Assessment: Normal  Communication   Communication: No difficulties  Cognition Arousal/Alertness:  Awake/alert Behavior During Therapy: WFL for tasks assessed/performed Overall Cognitive Status: Within Functional Limits for tasks assessed                                        General Comments      Exercises     Assessment/Plan    PT Assessment Patient needs continued PT services  PT Problem List Decreased strength;Decreased mobility;Decreased activity tolerance;Decreased balance       PT Treatment Interventions DME instruction;Gait training;Functional mobility training;Therapeutic activities;Therapeutic exercise;Balance training;Patient/family education    PT Goals (Current goals can be found in the Care Plan section)  Acute Rehab PT Goals Patient Stated Goal: to return home and feel more steady on her feet PT Goal Formulation: With patient Time For Goal Achievement: 02/13/19 Potential to Achieve Goals: Good    Frequency Min 3X/week   Barriers to discharge        Co-evaluation               AM-PAC PT "6 Clicks" Mobility  Outcome Measure Help needed turning from your back to your side while in a flat bed without using bedrails?: None Help needed moving from lying on your back to sitting on the side of a flat bed without using bedrails?: A Little Help needed moving to and from a bed to a chair (including a wheelchair)?: A Little Help needed standing up from a chair using your arms (e.g., wheelchair or bedside chair)?: A Little Help needed to walk in hospital room?: A Little Help needed climbing 3-5 steps with a railing? : A Little 6 Click Score: 19    End of Session Equipment Utilized During Treatment: Gait belt Activity Tolerance: Patient tolerated treatment well;No increased pain Patient left: in chair;with call bell/phone within reach Nurse Communication: Mobility status PT Visit Diagnosis: Unsteadiness on feet (R26.81);Difficulty in walking, not elsewhere classified (R26.2)    Time: [id number] PT Time Calculation (min) (ACUTE ONLY): 32  min   Charges:   PT Evaluation $PT Eval Low Complexity: 1 Low PT Treatments $Gait Training: 8-22 mins        Mickey Farber, PT, DPT   Acute Rehabilitation Department [phone]   Otho Bellows 01/30/2019, 10:05 AM

## 2019-01-30 NOTE — Progress Notes (Signed)
Patient ID: Casey Black, female   DOB: Aug 14, 1960, 58 y.o.   MRN: [id number]    6 Days Post-Op  Subjective: Does feel great today.  Having some nausea.  No emesis at this time.  Has a cough and not using her IS very well.    Objective: Vital signs in last 24 hours: Temp:  [98.4 F (36.9 C)-99.5 F (37.5 C)] 99.5 F (37.5 C) (10/27 0428) Pulse Rate:  [78-99] 99 (10/27 0428) Resp:  [18-22] 20 (10/27 0537) BP: (134-196)/(66-87) 169/75 (10/27 0537) SpO2:  [91 %-100 %] 91 % (10/27 0428) Last BM Date: 01/28/19  Intake/Output from previous day: 10/26 0701 - 10/27 0700 In: 320 [P.O.:320] Out: 1400 [Urine:900; Stool:500] Intake/Output this shift: No intake/output data recorded.  PE: Abd: soft, colostomy with liquid, watery output, already emptied twice this morning.  Will need to closely monitor output, midline wound with grossly purulent drainage from the entirety of the wound.  All of her wound had to be open.  NS WD dressing placed in wound.  Lab Results:  Recent Labs    01/28/19 0323 01/29/19 0348  WBC 8.0 9.7  HGB 9.0* 9.2*  HCT 28.7* 29.2*  PLT 249 276   BMET Recent Labs    01/29/19 0348 01/30/19 0133  NA 140 141  K 3.4* 3.6  CL 99 96*  CO2 30 32  GLUCOSE 110* 96  BUN 14 18  CREATININE 0.86 0.86  CALCIUM 8.8* 9.3   PT/INR No results for input(s): LABPROT, INR in the last 72 hours. CMP     Component Value Date/Time   NA 141 01/30/2019 0133   K 3.6 01/30/2019 0133   CL 96 (L) 01/30/2019 0133   CO2 32 01/30/2019 0133   GLUCOSE 96 01/30/2019 0133   BUN 18 01/30/2019 0133   CREATININE 0.86 01/30/2019 0133   CALCIUM 9.3 01/30/2019 0133   PROT 6.2 (L) 01/28/2019 0323   ALBUMIN 2.3 (L) 01/28/2019 0323   AST 21 01/28/2019 0323   ALT 16 01/28/2019 0323   ALKPHOS 58 01/28/2019 0323   BILITOT 0.8 01/28/2019 0323   GFRNONAA >60 01/30/2019 0133   GFRAA >60 01/30/2019 0133   Lipase     Component Value Date/Time   LIPASE 17 01/23/2019 0307        Studies/Results: No results found.  Anti-infectives: Anti-infectives (From admission, onward)   Start     Dose/Rate Route Frequency Ordered Stop   01/24/19 1030  cefoTEtan (CEFOTAN) 1 g in sodium chloride 0.9 % 100 mL IVPB     1 g 200 mL/hr over 30 Minutes Intravenous On call to O.R. 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 Kae Heller, 10/21 - POD#6 - pathology pending - on fulls, will stay here given some nausea -wound infection and now open.  Will start BID dressing changes.  She will be a great VAC candidate.  Will see if [facility] will approve, if so then plan to place [facility] tomorrow. -mobilize and IS -awaiting PT eval -will follow ostomy output.  With a lot of watery output so far today.  May need some fluids restarted pending output.  Monitor closley  FEN:FLD VTE: SCD's, sqheparin ID:pre-op only Foley:DC'd 10/22 Follow up:Dr. Kae Heller   LOS: 7 days    Henreitta Cea , PA-C  [facility] 01/30/2019, 8:58 AM Please see Amion for pager number during day hours 7:00am-4:30pm

## 2019-01-31 DIAGNOSIS — E876 Hypokalemia: Secondary | ICD-10-CM

## 2019-01-31 DIAGNOSIS — N179 Acute kidney failure, unspecified: Secondary | ICD-10-CM

## 2019-01-31 LAB — CBC
HCT: 29.9 % — ABNORMAL LOW (ref 36.0–46.0)
Hemoglobin: 9.5 g/dL — ABNORMAL LOW (ref 12.0–15.0)
MCH: 23.1 pg — ABNORMAL LOW (ref 26.0–34.0)
MCHC: 31.8 g/dL (ref 30.0–36.0)
MCV: 72.6 fL — ABNORMAL LOW (ref 80.0–100.0)
Platelets: 328 10*3/uL (ref 150–400)
RBC: 4.12 MIL/uL (ref 3.87–5.11)
RDW: 18.5 % — ABNORMAL HIGH (ref 11.5–15.5)
WBC: 13.1 10*3/uL — ABNORMAL HIGH (ref 4.0–10.5)
nRBC: 0.3 % — ABNORMAL HIGH (ref 0.0–0.2)

## 2019-01-31 LAB — COMPREHENSIVE METABOLIC PANEL
ALT: 12 U/L (ref 0–44)
AST: 14 U/L — ABNORMAL LOW (ref 15–41)
Albumin: 2.2 g/dL — ABNORMAL LOW (ref 3.5–5.0)
Alkaline Phosphatase: 68 U/L (ref 38–126)
Anion gap: 11 (ref 5–15)
BUN: 30 mg/dL — ABNORMAL HIGH (ref 6–20)
CO2: 33 mmol/L — ABNORMAL HIGH (ref 22–32)
Calcium: 9 mg/dL (ref 8.9–10.3)
Chloride: 92 mmol/L — ABNORMAL LOW (ref 98–111)
Creatinine, Ser: 1.37 mg/dL — ABNORMAL HIGH (ref 0.44–1.00)
GFR calc Af Amer: 49 mL/min — ABNORMAL LOW (ref >60)
GFR calc non Af Amer: 42 mL/min — ABNORMAL LOW (ref >60)
Glucose, Bld: 102 mg/dL — ABNORMAL HIGH (ref 70–99)
Potassium: 2.7 mmol/L — CL (ref 3.5–5.1)
Sodium: 136 mmol/L (ref 135–145)
Total Bilirubin: 0.4 mg/dL (ref 0.3–1.2)
Total Protein: 6.2 g/dL — ABNORMAL LOW (ref 6.5–8.1)

## 2019-01-31 LAB — C DIFFICILE QUICK SCREEN W PCR REFLEX
C Diff antigen: NEGATIVE
C Diff interpretation: NOT DETECTED
C Diff toxin: NEGATIVE

## 2019-01-31 LAB — MAGNESIUM: Magnesium: 1.9 mg/dL (ref 1.7–2.4)

## 2019-01-31 MED ORDER — POTASSIUM CHLORIDE IN NACL 40-0.9 MEQ/L-% IV SOLN
INTRAVENOUS | Status: DC
  Filled 2019-01-31: qty 1000

## 2019-01-31 MED ORDER — CALCIUM POLYCARBOPHIL 625 MG PO TABS
625.0000 mg | ORAL_TABLET | Freq: Every day | ORAL | Status: DC
  Administered 2019-01-31 – 2019-02-03 (×4): 625 mg via ORAL
  Filled 2019-01-31 (×4): qty 1

## 2019-01-31 MED ORDER — SODIUM CHLORIDE 0.9 % IV SOLN: INTRAVENOUS | Status: DC

## 2019-01-31 MED ORDER — POTASSIUM CHLORIDE IN NACL 20-0.9 MEQ/L-% IV SOLN
INTRAVENOUS | Status: DC
  Administered 2019-01-31 – 2019-02-02 (×5): via INTRAVENOUS
  Filled 2019-01-31 (×6): qty 1000

## 2019-01-31 MED ORDER — NICOTINE 7 MG/24HR TD PT24
7.0000 mg | MEDICATED_PATCH | Freq: Every day | TRANSDERMAL | Status: DC
  Administered 2019-02-01 – 2019-02-03 (×3): 7 mg via TRANSDERMAL
  Filled 2019-01-31 (×3): qty 1

## 2019-01-31 MED ORDER — POTASSIUM CHLORIDE CRYS ER 20 MEQ PO TBCR
40.0000 meq | EXTENDED_RELEASE_TABLET | Freq: Once | ORAL | Status: AC
  Administered 2019-01-31: 40 meq via ORAL
  Filled 2019-01-31: qty 2

## 2019-01-31 MED ORDER — LOPERAMIDE HCL 2 MG PO CAPS
2.0000 mg | ORAL_CAPSULE | Freq: Three times a day (TID) | ORAL | Status: DC
  Administered 2019-01-31 – 2019-02-03 (×9): 2 mg via ORAL
  Filled 2019-01-31 (×9): qty 1

## 2019-01-31 MED ORDER — POTASSIUM CHLORIDE 10 MEQ/100ML IV SOLN
10.0000 meq | INTRAVENOUS | Status: AC
  Administered 2019-01-31 (×2): 10 meq via INTRAVENOUS
  Filled 2019-01-31 (×2): qty 100

## 2019-01-31 MED ORDER — LOPERAMIDE HCL 2 MG PO CAPS
2.0000 mg | ORAL_CAPSULE | Freq: Three times a day (TID) | ORAL | Status: DC
  Administered 2019-01-31: 2 mg via ORAL
  Filled 2019-01-31: qty 1

## 2019-01-31 MED ORDER — POTASSIUM CHLORIDE IN NACL 40-0.9 MEQ/L-% IV SOLN
INTRAVENOUS | Status: DC
  Administered 2019-01-31: 75 mL/h via INTRAVENOUS
  Filled 2019-01-31: qty 1000

## 2019-01-31 NOTE — Progress Notes (Signed)
PROGRESS NOTE    Casey Black   [id number]  DOB: December 02, 1960  DOA: 01/23/2019 PCP: Patient, No Pcp Per   Brief Narrative:  [name] Setting 58 y.o.femalewithno known pastmedical history significant(does not see doctors) presenting with abdominal pain.She reports that her stomach was giving her problems. When she would eat, it was very painful - couldn't lay down or sit up. She felt constipated and took 2 Senokot. About 0100, she developed n/v and was unable to stop and so her daughter brought her in. The symptoms started maybe 2 months ago but was intermittent. 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 She has had marked unintentional weight loss in the last few months.    Subjective: She has no complaints.    Assessment & Plan:   Principal Problem:   SBO (small bowel obstruction)/colon mass with liver metastasis -Status post transverse colectomy with end colostomy and liver biopsy -Biopsy reveals: Adenocarcinoma with metastasis to 1 out of 13 lymph nodes -Liver biopsy also reveals metastatic adenocarcinoma -Large amount of volume coming out of the colostomy-management Per general surgery -We will request oncology eval  Active Problems:  AKI -Due to diarrhea -Continue IV fluids and follow  Hypokalemia -Possibly secondary to diarrhea -Replacing -Magnesium is normal    Hypertension -Continue hydralazine, isosorbide mononitrate, lisinopril and metoprolol    Tobacco dependence -Wean nicotine patch down to 7 mg    Time spent in minutes: 35 DVT prophylaxis: Heparin Code Status: Full code Family Communication:  Disposition Plan: Home Consultants:   General surgery  Oncology Procedures:   Colectomy and colostomy Antimicrobials:  Anti-infectives (From admission, onward)   Start     Dose/Rate Route Frequency Ordered Stop   01/24/19 1030  cefoTEtan (CEFOTAN) 1 g in sodium chloride 0.9 % 100 mL IVPB     1 g 200 mL/hr over 30  Minutes Intravenous On call to O.R. 01/23/19 1344 01/24/19 1146       Objective: Vitals:   01/30/19 2226 01/31/19 0426 01/31/19 0905 01/31/19 1345  BP: (!) 150/79 (!) 158/73 (!) 155/66 (!) 157/60  Pulse: 87 72 77 86  Resp: 18 19 20 15   Temp: 98.2 F (36.8 C) 98.4 F (36.9 C) 97.9 F (36.6 C) 98.9 F (37.2 C)  TempSrc: Oral Oral Oral Oral  SpO2: 95% 95% 95% 94%  Weight:      Height:        Intake/Output Summary (Last 24 hours) at 01/31/2019 1837 Last data filed at 01/31/2019 1803 Gross per 24 hour  Intake 1451.31 ml  Output 3540 ml  Net -2088.69 ml   Filed Weights   01/24/19 1036  Weight: 92.1 kg    Examination: General exam: Appears comfortable  HEENT: PERRLA, oral mucosa moist, no sclera icterus or thrush Respiratory system: Clear to auscultation. Respiratory effort normal. Cardiovascular system: S1 & S2 heard, RRR.   Gastrointestinal system: Abdomen soft, nondistended, colostomy wound VAC noted - normal bowel sounds. Central nervous system: Alert and oriented. No focal neurological deficits. Extremities: No cyanosis, clubbing or edema Skin: No rashes or ulcers Psychiatry:  Mood & affect appropriate.     Data Reviewed: I have personally reviewed following labs and imaging studies  CBC: Recent Labs  Lab 01/25/19 0153 01/27/19 0137 01/28/19 0323 01/29/19 0348 01/31/19 0350  WBC 4.9 6.4 8.0 9.7 13.1*  HGB 11.3* 8.7* 9.0* 9.2* 9.5*  HCT 37.3 28.2* 28.7* 29.2* 29.9*  MCV 75.7* 75.8* 75.1* 73.9* 72.6*  PLT 258 211 249 276  [id number]   Basic Metabolic Panel: Recent Labs  Lab 01/26/19 0349 01/27/19 0137 01/27/19 0807 01/28/19 0323 01/29/19 0348 01/30/19 0133 01/31/19 0350  NA 140 141  --  144 140 141 136  K 3.6 3.5  --  3.3* 3.4* 3.6 2.7*  CL 104 104  --  105 99 96* 92*  CO2 24 25  --  28 30 32 33*  GLUCOSE 86 86  --  64* 110* 96 102*  BUN 32* 28*  --  20 14 18  30*  CREATININE 1.02* 0.88  --  0.72 0.86 0.86 1.37*  CALCIUM 8.7* 8.8*  --  9.0 8.8* 9.3  9.0  MG 1.5*  --  2.4  --   --   --  1.9   GFR: Estimated Creatinine Clearance: 52.1 mL/min (A) (by C-G formula based on SCr of 1.37 mg/dL (H)). Liver Function Tests: Recent Labs  Lab 01/27/19 0137 01/28/19 0323 01/31/19 0350  AST 28 21 14*  ALT 16 16 12   ALKPHOS 52 58 68  BILITOT 0.6 0.8 0.4  PROT 5.9* 6.2* 6.2*  ALBUMIN 2.3* 2.3* 2.2*   No results for input(s): LIPASE, AMYLASE in the last 168 hours. No results for input(s): AMMONIA in the last 168 hours. Coagulation Profile: No results for input(s): INR, PROTIME in the last 168 hours. Cardiac Enzymes: No results for input(s): CKTOTAL, CKMB, CKMBINDEX, TROPONINI in the last 168 hours. BNP (last 3 results) No results for input(s): PROBNP in the last 8760 hours. HbA1C: No results for input(s): HGBA1C in the last 72 hours. CBG: No results for input(s): GLUCAP in the last 168 hours. Lipid Profile: No results for input(s): CHOL, HDL, LDLCALC, TRIG, CHOLHDL, LDLDIRECT in the last 72 hours. Thyroid Function Tests: No results for input(s): TSH, T4TOTAL, FREET4, T3FREE, THYROIDAB in the last 72 hours. Anemia Panel: No results for input(s): VITAMINB12, FOLATE, FERRITIN, TIBC, IRON, RETICCTPCT in the last 72 hours. Urine analysis: No results found for: COLORURINE, APPEARANCEUR, LABSPEC, PHURINE, GLUCOSEU, HGBUR, BILIRUBINUR, KETONESUR, PROTEINUR, UROBILINOGEN, NITRITE, LEUKOCYTESUR Sepsis Labs: @LABRCNTIP (procalcitonin:4,lacticidven:4) ) Recent Results (from the past 240 hour(s))  SARS CORONAVIRUS 2 (TAT 6-24 HRS) Nasopharyngeal Nasopharyngeal Swab     Status: None   Collection Time: 01/23/19  7:52 AM   Specimen: Nasopharyngeal Swab  Result Value Ref Range Status   SARS Coronavirus 2 NEGATIVE NEGATIVE Final    Comment: (NOTE) SARS-CoV-2 target nucleic acids are NOT DETECTED. The SARS-CoV-2 RNA is generally detectable in upper and lower respiratory specimens during the acute phase of infection. Negative results do not preclude  SARS-CoV-2 infection, do not rule out co-infections with other pathogens, and should not be used as the sole basis for treatment or other patient management decisions. Negative results must be combined with clinical observations, patient history, and epidemiological information. The expected result is Negative. Fact Sheet for Patients: SugarRoll.be Fact Sheet for Healthcare Providers: https://www.woods-mathews.com/ This test is not yet approved or cleared by the [place] FDA and  has been authorized for detection and/or diagnosis of SARS-CoV-2 by FDA under an Emergency Use Authorization (EUA). This EUA will remain  in effect (meaning this test can be used) for the duration of the COVID-19 declaration under Section 56 4(b)(1) of the Act, 21 U.S.C. section 360bbb-3(b)(1), unless the authorization is terminated or revoked sooner. Performed at [facility], [place] [address], [place], [place] [address]   Surgical pcr screen     Status: None   Collection Time: 01/23/19  6:51 PM   Specimen: Nasal  Mucosa; Nasal Swab  Result Value Ref Range Status   MRSA, PCR NEGATIVE NEGATIVE Final   Staphylococcus aureus NEGATIVE NEGATIVE Final    Comment: (NOTE) The Xpert SA Assay (FDA approved for NASAL specimens in patients 52 years of age and older), is one component of a comprehensive surveillance program. It is not intended to diagnose infection nor to guide or monitor treatment. Performed at [facility], [place] [address], [place], [place] [address]   C difficile quick scan w PCR reflex     Status: None   Collection Time: 01/31/19  2:04 PM   Specimen: STOOL  Result Value Ref Range Status   C Diff antigen NEGATIVE NEGATIVE Final   C Diff toxin NEGATIVE NEGATIVE Final   C Diff interpretation No C. difficile detected.  Final    Comment: Performed at [facility], [place] [address] [place], [place] [address]         Radiology  Studies: No results found.    Scheduled Meds: . acetaminophen  650 mg Oral Q6H  . heparin  5,000 Units Subcutaneous Q8H  . hydrALAZINE  25 mg Oral Q8H  . isosorbide mononitrate  30 mg Oral Daily  . lisinopril  40 mg Oral Daily  . loperamide  2 mg Oral TID  . metoprolol tartrate  50 mg Oral BID  . nicotine  14 mg Transdermal Daily  . polycarbophil  625 mg Oral Daily   Continuous Infusions: . 0.9 % NaCl with KCl 20 mEq / L 100 mL/hr at 01/31/19 1152     LOS: 8 days      Debbe Odea, MD [facility] Pager: [email or web address] Password [facility] 01/31/2019, 6:37 PM

## 2019-01-31 NOTE — Progress Notes (Addendum)
Patient ID: MILIRA HELMAN, female   DOB: 1960/11/02, 58 y.o.   MRN: [id number]    7 Days Post-Op  Subjective: Patient with no new complaints.  Ate a small amount of her breakfast this morning, but not a ton.  Had 4L of ostomy output yesterday with almost a doubling of her creatinine and a drop in her K from it.    ROS: See above, otherwise other systems negative  Objective: Vital signs in last 24 hours: Temp:  [97.9 F (36.6 C)-98.6 F (37 C)] 97.9 F (36.6 C) (10/28 0905) Pulse Rate:  [72-87] 77 (10/28 0905) Resp:  [18-20] 20 (10/28 0905) BP: (148-158)/(65-79) 155/66 (10/28 0905) SpO2:  [93 %-95 %] 95 % (10/28 0905) Last BM Date: 01/30/19  Intake/Output from previous day: 10/27 0701 - 10/28 0700 In: 610 [P.O.:610] Out: 3895 [Stool:3895] Intake/Output this shift: Total I/O In: -  Out: 615 [Other:15; Stool:600]  PE: Abd: soft, colostomy with copious bilious liquid output today.  Stoma is pink and viable.  Midline wound is clean and open now, WOC present to place VAC.  Wound measures approximately 18x4x4cm.  This is 100% clean tissue today  Lab Results:  Recent Labs    01/29/19 0348 01/31/19 0350  WBC 9.7 13.1*  HGB 9.2* 9.5*  HCT 29.2* 29.9*  PLT 276 328   BMET Recent Labs    01/30/19 0133 01/31/19 0350  NA 141 136  K 3.6 2.7*  CL 96* 92*  CO2 32 33*  GLUCOSE 96 102*  BUN 18 30*  CREATININE 0.86 1.37*  CALCIUM 9.3 9.0   PT/INR No results for input(s): LABPROT, INR in the last 72 hours. CMP     Component Value Date/Time   NA 136 01/31/2019 0350   K 2.7 (LL) 01/31/2019 0350   CL 92 (L) 01/31/2019 0350   CO2 33 (H) 01/31/2019 0350   GLUCOSE 102 (H) 01/31/2019 0350   BUN 30 (H) 01/31/2019 0350   CREATININE 1.37 (H) 01/31/2019 0350   CALCIUM 9.0 01/31/2019 0350   PROT 6.2 (L) 01/31/2019 0350   ALBUMIN 2.2 (L) 01/31/2019 0350   AST 14 (L) 01/31/2019 0350   ALT 12 01/31/2019 0350   ALKPHOS 68 01/31/2019 0350   BILITOT 0.4 01/31/2019 0350   GFRNONAA 42 (L) 01/31/2019 0350   GFRAA 49 (L) 01/31/2019 0350   Lipase     Component Value Date/Time   LIPASE 17 01/23/2019 0307       Studies/Results: No results found.  Anti-infectives: Anti-infectives (From admission, onward)   Start     Dose/Rate Route Frequency Ordered Stop   01/24/19 1030  cefoTEtan (CEFOTAN) 1 g in sodium chloride 0.9 % 100 mL IVPB     1 g 200 mL/hr over 30 Minutes Intravenous On call to O.R. 01/23/19 1344 01/24/19 1146       Assessment/Plan HTN - new on admission; per medicine Acute blood loss anemia- hgb9.5, stable ARI - secondary to dehydration from colostomy output.  Around 4L yesterday with already over 600+ today.  Cr bump from 0.8 to 1.37.  Restart IVFs today with K present. Hypokalemia - being replaced by medicine as well.  Obstructing transverse colon mass with likely liver metastases S/P transverse colectomy with end colostomy, liver biopsy, Dr. Kae Heller, 10/21 - POD#7 - pathology: A. COLON, TRANSVERSE, RESECTION:  Colonic adenocarcinoma, 5 cm.  Carcinoma extends into pericolonic connective tissue and focally to  serosal surface.  Margins not involved.  Metastatic carcinoma in one of thirteen lymph nodes (  1/13).  B. LIVER NODULE, LEFT, BIOPSY:  Metastatic adenocarcinoma.   -will need oncology eval. - soft diet -wound VAC approved by [facility], WOC to place today and start VAC changes MWF. -mobilize and IS -therapy recommends HH PT -high colostomy output.  Will start fiber and add imodium 2mg  TID for now.  May need to increase if high output persists, but don't want to jump to highest dose yet either. -closely monitor to avoid renal failure and hypokalemia secondary to GI loss.  [id number], restart IVFs VTE: SCD's,sqheparin ID:pre-op only Foley:DC'd 10/22 Follow up:Dr. Kae Heller   LOS: 8 days    Henreitta Cea , [facility] 01/31/2019, 10:24 AM Please see Amion for pager number during day hours  7:00am-4:30pm

## 2019-01-31 NOTE — Consult Note (Addendum)
[place] Nurse wound consult note Reason for Consult: Initiate NPWT to midline wound Wound type: Surgical, reopened yesterday due to infection. Pressure Injury POA: N/A Measurement: 18cm x 7cm x 3cm Wound bed: 100% red, moist Drainage (amount, consistency, odor) small amount serous Periwound:intact. No erythema, no induration or warmth.   Dressing procedure/placement/frequency: Three times weekly NPWT dressing changes are ordered.  Next scheduled dressing change is Friday, 01/23/19.  There is a dressing change kit in the room.  Periwound prepared with liquid barrier film application (Cavillon) and allowed to dry.  Periwound then covered with strips of drape. A portion of a skin barrier ring is used to fill the defect at the umbilicus and line the wound at the most distal periphery.  One piece of black foam used to obliterate dead space, this is covered with drape and attached to 117mHg continuous negative pressure. An immediate seal is achieved and patient tolerated procedure well. The tubing is labeled with the # and type of wound contact layers beneath drape.  WPisgahNurse ostomy follow up Stoma type/location: RUQ ascending colostomy. Currently exhibiting high output.  Patient and CCS PA K. OMaxwell Caulreport two leakages since Monday. Stomal assessment/size: 2 inches, red, raised, edematous, moist with os at center Peristomal assessment: intact, macerated secondary to leakage behind skin barrier  Treatment options for stomal/peristomal skin: skin barrier ring Output: 3024m emptied at beginning of visit and 150 mls emptied during dressing change Ostomy pouching: 2pc. 2 and 3/4 inch pouching system with skin barrier ring Education provided: Patient taught to notify nursing staff when pouch is 1/3 to 1/2 full of air or effluent so that over-filling and subsequent leakages do not occur. [place]: Yes, previously  Three ostomy pouching set ups and 5 skin  barrier rings are at the bedside. If high output continues, please order high output pouch to attach to skin barrier. Pouch is LaKellie [phone]  [place] will continue to follow along with you.Next planned visit on Friday, 02/02/19  I appreciated the hand off and bedside update from CCS PA, KeSaverio Danker I was assisted in the application of the new pouching system and NPWT dressing change by [employer] instructor C. Staton and 4 members of her nursing clinical group with the patient's permission.  Their compassion, skills and interest expressed by appropriate questioning was appreciated.  Thanks, LaMaudie FlakesMSN, RN, GNPenascoCWArther AbbottPager# [phone]

## 2019-01-31 NOTE — Progress Notes (Signed)
CRITICAL VALUE ALERT  Critical Value:K+ level 2.7  Date & Time Notied: 01/31/19 [id number]  Provider Notified: Tylene Fantasia, NP  Orders Received/Actions taken:Yes

## 2019-01-31 NOTE — Plan of Care (Signed)
  Problem: Activity: Goal: Risk for activity intolerance will decrease Outcome: Progressing   Problem: Nutrition: Goal: Adequate nutrition will be maintained Outcome: Progressing   Problem: Elimination: Goal: Will not experience complications related to urinary retention Outcome: Progressing   Problem: Pain Managment: Goal: General experience of comfort will improve Outcome: Adequate for Discharge

## 2019-02-01 ENCOUNTER — Encounter (HOSPITAL_COMMUNITY): Payer: Self-pay | Admitting: Oncology

## 2019-02-01 DIAGNOSIS — K56609 Unspecified intestinal obstruction, unspecified as to partial versus complete obstruction: Secondary | ICD-10-CM | POA: Diagnosis not present

## 2019-02-01 LAB — BASIC METABOLIC PANEL
Anion gap: 13 (ref 5–15)
BUN: 31 mg/dL — ABNORMAL HIGH (ref 6–20)
CO2: 27 mmol/L (ref 22–32)
Calcium: 8.4 mg/dL — ABNORMAL LOW (ref 8.9–10.3)
Chloride: 96 mmol/L — ABNORMAL LOW (ref 98–111)
Creatinine, Ser: 1.01 mg/dL — ABNORMAL HIGH (ref 0.44–1.00)
GFR calc Af Amer: >60 mL/min (ref >60)
GFR calc non Af Amer: >60 mL/min (ref >60)
Glucose, Bld: 79 mg/dL (ref 70–99)
Potassium: 3.4 mmol/L — ABNORMAL LOW (ref 3.5–5.1)
Sodium: 136 mmol/L (ref 135–145)

## 2019-02-01 LAB — CBC
HCT: 28.1 % — ABNORMAL LOW (ref 36.0–46.0)
Hemoglobin: 9 g/dL — ABNORMAL LOW (ref 12.0–15.0)
MCH: 23.1 pg — ABNORMAL LOW (ref 26.0–34.0)
MCHC: 32 g/dL (ref 30.0–36.0)
MCV: 72.1 fL — ABNORMAL LOW (ref 80.0–100.0)
Platelets: 325 10*3/uL (ref 150–400)
RBC: 3.9 MIL/uL (ref 3.87–5.11)
RDW: 18.4 % — ABNORMAL HIGH (ref 11.5–15.5)
WBC: 15.1 10*3/uL — ABNORMAL HIGH (ref 4.0–10.5)
nRBC: 0.2 % (ref 0.0–0.2)

## 2019-02-01 MED ORDER — POTASSIUM CHLORIDE CRYS ER 20 MEQ PO TBCR
40.0000 meq | EXTENDED_RELEASE_TABLET | ORAL | Status: AC
  Administered 2019-02-01 (×2): 40 meq via ORAL
  Filled 2019-02-01 (×2): qty 2

## 2019-02-01 NOTE — Progress Notes (Addendum)
PROGRESS NOTE    LACYE WEICHMAN   [id number]  DOB: October 08, 1960  DOA: 01/23/2019 PCP: Patient, No Pcp Per   Brief Narrative:  [name] Setting 58 y.o.femalewithno known pastmedical history significant(does not see doctors) presenting with abdominal pain, nausea and vomiting.She felt constipated and took 2 Senokot. About 0100, she developed n/v and was unable to stop and so her daughter brought her in. The symptoms started maybe 2 months ago but was intermittent. She has had marked unintentional weight loss in the last few months. She was admitted for a SBO due to a mass obstructing the transverse colon with what appeared to be hepatic metastasis.   Subjective: She has no complaints.    Assessment & Plan:   Principal Problem:   SBO (small bowel obstruction)/colon mass with liver metastasis - 10/21- she underwent transverse colectomy with end colostomy and liver biopsy -colon biopsy reveals: Adenocarcinoma with metastasis to 1 out of 13 lymph nodes -Liver biopsy also reveals metastatic adenocarcinoma - have requested an oncology eval - NG removed 10/25 - PCA stopped on 10/26 - diet advanced - 10/28 wound vac applied to wound - starting 10/28, noted to have a large amount of volume coming out of the colostomy  -  - c diff negative- she has been started on Lomotil and fiber   Active Problems:  AKI and hypokalemia -Due to diarrhea - ~ 2 L out from ostomy yesterday -Continue IV fluids, encourage oral fluids, replace K     Hypertension -Continue hydralazine, isosorbide mononitrate, lisinopril and metoprolol    Tobacco dependence -Wean nicotine patch down to 7 mg -counseled   Time spent in minutes: 35 DVT prophylaxis: Heparin Code Status: Full code Family Communication:  Disposition Plan: Home Consultants:   General surgery  Oncology Procedures:   Colectomy and colostomy Antimicrobials:  Anti-infectives (From admission, onward)   Start     Dose/Rate  Route Frequency Ordered Stop   01/24/19 1030  cefoTEtan (CEFOTAN) 1 g in sodium chloride 0.9 % 100 mL IVPB     1 g 200 mL/hr over 30 Minutes Intravenous On call to O.R. 01/23/19 1344 01/24/19 1146       Objective: Vitals:   01/31/19 0905 01/31/19 1345 01/31/19 2128 02/01/19 0508  BP: (!) 155/66 (!) 157/60 (!) 144/72 (!) 159/73  Pulse: 77 86 68 71  Resp: 20 15 18 18   Temp: 97.9 F (36.6 C) 98.9 F (37.2 C) 98.6 F (37 C) 98.2 F (36.8 C)  TempSrc: Oral Oral Oral Oral  SpO2: 95% 94% 96% 97%  Weight:      Height:        Intake/Output Summary (Last 24 hours) at 02/01/2019 1238 Last data filed at 02/01/2019 0900 Gross per 24 hour  Intake 2294.92 ml  Output 2125 ml  Net 169.92 ml   Filed Weights   01/24/19 1036  Weight: 92.1 kg    Examination: General exam: Appears comfortable  HEENT: PERRLA, oral mucosa moist, no sclera icterus or thrush Respiratory system: Clear to auscultation. Respiratory effort normal. Cardiovascular system: S1 & S2 heard,  No murmurs  Gastrointestinal system: Abdomen soft, nondistended. Normal bowel sounds  , ostomy with watery green fluid present Central nervous system: Alert and oriented. No focal neurological deficits. Extremities: No cyanosis, clubbing or edema Skin: No rashes or ulcers Psychiatry:  Mood & affect appropriate.    Data Reviewed: I have personally reviewed following labs and imaging studies  CBC: Recent Labs  Lab 01/27/19 0137 01/28/19 0323 01/29/19 0348  01/31/19 0350 02/01/19 0122  WBC 6.4 8.0 9.7 13.1* 15.1*  HGB 8.7* 9.0* 9.2* 9.5* 9.0*  HCT 28.2* 28.7* 29.2* 29.9* 28.1*  MCV 75.8* 75.1* 73.9* 72.6* 72.1*  PLT 211 249 276 328 [id number]   Basic Metabolic Panel: Recent Labs  Lab 01/26/19 0349  01/27/19 0807 01/28/19 0323 01/29/19 0348 01/30/19 0133 01/31/19 0350 02/01/19 0122  NA 140   < >  --  144 140 141 136 136  K 3.6   < >  --  3.3* 3.4* 3.6 2.7* 3.4*  CL 104   < >  --  105 99 96* 92* 96*  CO2 24   < >  --   28 30 32 33* 27  GLUCOSE 86   < >  --  64* 110* 96 102* 79  BUN 32*   < >  --  20 14 18  30* 31*  CREATININE 1.02*   < >  --  0.72 0.86 0.86 1.37* 1.01*  CALCIUM 8.7*   < >  --  9.0 8.8* 9.3 9.0 8.4*  MG 1.5*  --  2.4  --   --   --  1.9  --    < > = values in this interval not displayed.   GFR: Estimated Creatinine Clearance: 70.7 mL/min (A) (by C-G formula based on SCr of 1.01 mg/dL (H)). Liver Function Tests: Recent Labs  Lab 01/27/19 0137 01/28/19 0323 01/31/19 0350  AST 28 21 14*  ALT 16 16 12   ALKPHOS 52 58 68  BILITOT 0.6 0.8 0.4  PROT 5.9* 6.2* 6.2*  ALBUMIN 2.3* 2.3* 2.2*   No results for input(s): LIPASE, AMYLASE in the last 168 hours. No results for input(s): AMMONIA in the last 168 hours. Coagulation Profile: No results for input(s): INR, PROTIME in the last 168 hours. Cardiac Enzymes: No results for input(s): CKTOTAL, CKMB, CKMBINDEX, TROPONINI in the last 168 hours. BNP (last 3 results) No results for input(s): PROBNP in the last 8760 hours. HbA1C: No results for input(s): HGBA1C in the last 72 hours. CBG: No results for input(s): GLUCAP in the last 168 hours. Lipid Profile: No results for input(s): CHOL, HDL, LDLCALC, TRIG, CHOLHDL, LDLDIRECT in the last 72 hours. Thyroid Function Tests: No results for input(s): TSH, T4TOTAL, FREET4, T3FREE, THYROIDAB in the last 72 hours. Anemia Panel: No results for input(s): VITAMINB12, FOLATE, FERRITIN, TIBC, IRON, RETICCTPCT in the last 72 hours. Urine analysis: No results found for: COLORURINE, APPEARANCEUR, LABSPEC, PHURINE, GLUCOSEU, HGBUR, BILIRUBINUR, KETONESUR, PROTEINUR, UROBILINOGEN, NITRITE, LEUKOCYTESUR Sepsis Labs: @LABRCNTIP (procalcitonin:4,lacticidven:4) ) Recent Results (from the past 240 hour(s))  SARS CORONAVIRUS 2 (TAT 6-24 HRS) Nasopharyngeal Nasopharyngeal Swab     Status: None   Collection Time: 01/23/19  7:52 AM   Specimen: Nasopharyngeal Swab  Result Value Ref Range Status   SARS Coronavirus  2 NEGATIVE NEGATIVE Final    Comment: (NOTE) SARS-CoV-2 target nucleic acids are NOT DETECTED. The SARS-CoV-2 RNA is generally detectable in upper and lower respiratory specimens during the acute phase of infection. Negative results do not preclude SARS-CoV-2 infection, do not rule out co-infections with other pathogens, and should not be used as the sole basis for treatment or other patient management decisions. Negative results must be combined with clinical observations, patient history, and epidemiological information. The expected result is Negative. Fact Sheet for Patients: SugarRoll.be Fact Sheet for Healthcare Providers: https://www.woods-mathews.com/ This test is not yet approved or cleared by the [place] FDA and  has been authorized for detection and/or diagnosis  of SARS-CoV-2 by FDA under an Emergency Use Authorization (EUA). This EUA will remain  in effect (meaning this test can be used) for the duration of the COVID-19 declaration under Section 56 4(b)(1) of the Act, 21 U.S.C. section 360bbb-3(b)(1), unless the authorization is terminated or revoked sooner. Performed at [facility], [place] [address] [place], [place] [address]   Surgical pcr screen     Status: None   Collection Time: 01/23/19  6:51 PM   Specimen: Nasal Mucosa; Nasal Swab  Result Value Ref Range Status   MRSA, PCR NEGATIVE NEGATIVE Final   Staphylococcus aureus NEGATIVE NEGATIVE Final    Comment: (NOTE) The Xpert SA Assay (FDA approved for NASAL specimens in patients 47 years of age and older), is one component of a comprehensive surveillance program. It is not intended to diagnose infection nor to guide or monitor treatment. Performed at [facility], [place] [address], [place], [place] [address]   C difficile quick scan w PCR reflex     Status: None   Collection Time: 01/31/19  2:04 PM   Specimen: STOOL  Result Value Ref Range Status   C  Diff antigen NEGATIVE NEGATIVE Final   C Diff toxin NEGATIVE NEGATIVE Final   C Diff interpretation No C. difficile detected.  Final    Comment: Performed at [facility], [place] [address] [place], [place] [address]         Radiology Studies: No results found.    Scheduled Meds: . acetaminophen  650 mg Oral Q6H  . heparin  5,000 Units Subcutaneous Q8H  . hydrALAZINE  25 mg Oral Q8H  . isosorbide mononitrate  30 mg Oral Daily  . lisinopril  40 mg Oral Daily  . loperamide  2 mg Oral TID  . metoprolol tartrate  50 mg Oral BID  . nicotine  7 mg Transdermal Daily  . polycarbophil  625 mg Oral Daily   Continuous Infusions: . 0.9 % NaCl with KCl 20 mEq / L 100 mL/hr at 02/01/19 0832     LOS: 9 days      Debbe Odea, MD [facility] Pager: [email or web address] Password [facility] 02/01/2019, 12:38 PM

## 2019-02-01 NOTE — Progress Notes (Signed)
Patient ID: TARAYA LIEBEL, female   DOB: April 04, 1961, 58 y.o.   MRN: [id number]    8 Days Post-Op  Subjective: Patient with no new complaints.  Eating much better now. Denies any n/v or abd pain.   ROS: See above, otherwise other systems negative  Objective: Vital signs in last 24 hours: Temp:  [98.2 F (36.8 C)-98.9 F (37.2 C)] 98.2 F (36.8 C) (10/29 0508) Pulse Rate:  [68-86] 71 (10/29 0508) Resp:  [15-18] 18 (10/29 0508) BP: (144-159)/(60-73) 159/73 (10/29 0508) SpO2:  [94 %-97 %] 97 % (10/29 0508) Last BM Date: 02/01/19  Intake/Output from previous day: 10/28 0701 - 10/29 0700 In: 2174.9 [P.O.:750; I.V.:1424.9] Out: 2890 [Urine:625; Stool:2250] Intake/Output this shift: No intake/output data recorded.  PE: Abd: soft, colostomy with thickening but still watery.  Stoma is pink and viable.  Midline wound is clean and open now, WOC present to place VAC.  Wound measures approximately 18x4x4cm.  This is 100% clean tissue today  Lab Results:  Recent Labs    01/31/19 0350 02/01/19 0122  WBC 13.1* 15.1*  HGB 9.5* 9.0*  HCT 29.9* 28.1*  PLT 328 325   BMET Recent Labs    01/31/19 0350 02/01/19 0122  NA 136 136  K 2.7* 3.4*  CL 92* 96*  CO2 33* 27  GLUCOSE 102* 79  BUN 30* 31*  CREATININE 1.37* 1.01*  CALCIUM 9.0 8.4*   PT/INR No results for input(s): LABPROT, INR in the last 72 hours. CMP     Component Value Date/Time   NA 136 02/01/2019 0122   K 3.4 (L) 02/01/2019 0122   CL 96 (L) 02/01/2019 0122   CO2 27 02/01/2019 0122   GLUCOSE 79 02/01/2019 0122   BUN 31 (H) 02/01/2019 0122   CREATININE 1.01 (H) 02/01/2019 0122   CALCIUM 8.4 (L) 02/01/2019 0122   PROT 6.2 (L) 01/31/2019 0350   ALBUMIN 2.2 (L) 01/31/2019 0350   AST 14 (L) 01/31/2019 0350   ALT 12 01/31/2019 0350   ALKPHOS 68 01/31/2019 0350   BILITOT 0.4 01/31/2019 0350   GFRNONAA >60 02/01/2019 0122   GFRAA >60 02/01/2019 0122   Lipase     Component Value Date/Time   LIPASE 17  01/23/2019 0307       Studies/Results: No results found.  Anti-infectives: Anti-infectives (From admission, onward)   Start     Dose/Rate Route Frequency Ordered Stop   01/24/19 1030  cefoTEtan (CEFOTAN) 1 g in sodium chloride 0.9 % 100 mL IVPB     1 g 200 mL/hr over 30 Minutes Intravenous On call to O.R. 01/23/19 1344 01/24/19 1146       Assessment/Plan HTN - new on admission; per medicine Acute blood loss anemia- hgb9.5, stable ARI - secondary to dehydration from colostomy output.  Around 4L yesterday with already over 600+ today.  Cr bump from 0.8 to 1.37.  Restart IVFs today with K present. Hypokalemia - being replaced by medicine as well.  Obstructing transverse colon mass with likely liver metastases S/P transverse colectomy with end colostomy, liver biopsy, Dr. Kae Heller, 10/21 - POD#7 - pathology: A. COLON, TRANSVERSE, RESECTION:  Colonic adenocarcinoma, 5 cm.  Carcinoma extends into pericolonic connective tissue and focally to  serosal surface.  Margins not involved.  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  B. LIVER NODULE, LEFT, BIOPSY:  Metastatic adenocarcinoma.   -will need oncology eval. -carb controlled diet to decrease amount of sweets/ sugar -wound VAC approved by [facility], WOC to  place today and start VAC changes MWF. -mobilize and IS -therapy recommends HH PT -high colostomy output.  Cont imodium 2mg  TID for now.  -closely monitor to avoid renal failure and hypokalemia secondary to GI loss.  [id number], restart IVFs VTE: SCD's,sqheparin ID:pre-op only Foley:DC'd 10/22 Follow up:Dr. Kae Heller   LOS: 9 days   Sharon Mt. Dema Severin, M.D. [facility]

## 2019-02-01 NOTE — Consult Note (Addendum)
[place]  [phone] [phone]   MEDICAL ONCOLOGY - INITIAL CONSULTATION  Referral MD: Dr. Debbe Odea  Reason for Referral: Metastatic colon adenocarcinoma  HPI: Ms. Genoveva Ill is a 58 year old female with no significant past medical history prior to admission and she has not been seen by primary care provider in many years.  The patient presented to the emergency room with abdominal pain.  The patient reported issues with abdominal pain and constipation intermittently for about 2 months which is progressively worsening.  On the day prior to admission, she took 2 Senokot for constipation and subsequently started to have intractable nausea and vomiting.  She was brought to the emergency room by her daughter for further evaluation.  In the ER, she was noted to have a white blood cell count of 11.0, normal hemoglobin at 12.7, microcytosis and MCV of 73.2, and a normal platelet count of 299,000.  She had an elevated calcium at 10.9.  Abdominal x-ray showed a bowel gas pattern consistent with small bowel obstruction, no free air.  She was seen by general surgery who recommended NG tube and obtaining a CT of the abdomen pelvis.  CT of the abdomen pelvis with contrast was performed on 01/23/2019 which showed obstructing mid transverse colonic mass with regional adenopathy and hepatic metastatic disease.  The mass likely extends through the serosa, no ascites or peritoneal nodularity.  Gastroenterology also saw the patient for consideration of stent placement.  The tumor was more proximal in the colon then would be feasible to stent.  The patient was taken to the OR on 01/24/2019 and underwent a transverse colectomy with end colostomy and liver biopsy.  Pathology from the transverse colon mass showed colonic adenocarcinoma, 5 cm with metastatic carcinoma in 1 of 13 lymph nodes.  Biopsy from the liver nodule showed metastatic adenocarcinoma.  CEA was obtained on 01/25/2019 and  was elevated at 253.0.  When seen today, the patient reports that she is recovering well from her surgery.  She is not currently having any abdominal pain, nausea, or vomiting.  She is having loose brown stool through her colostomy.  The patient reports that prior to admission she had anorexia and a weight loss of 40 to 50 pounds over a 63-month period of time.  She has not had any fevers or chills.  No headaches or dizziness.  Denies chest discomfort and shortness of breath.  The patient has not noticed any melena or hematochezia.  Denies any prior history of colonoscopy.  The patient is single and has 2 daughters who live locally.  Reports social alcohol use.  She currently smokes 1 pack of cigarettes per day and has done so for the past 29 years.  The patient has been working 2 jobs in [employer] at [employer] and in [employer] at [employer].  She is independent with ADLs and is able to drive herself.  Has family history of cancers.  Medical oncology was asked see the patient to make recommendations regarding her newly diagnosed metastatic colon adenocarcinoma.   Past Medical History:  Diagnosis Date  . Hypertension 01/23/2019  . SBO (small bowel obstruction) ([place]) 01/23/2019  :  Past Surgical History:  Procedure Laterality Date  . CESAREAN SECTION     x2  . COLOSTOMY N/A 01/24/2019   Procedure: End Loop Colostomy;  Surgeon: Clovis Riley, MD;  Location: [facility];  Service: General;  Laterality: N/A;  . PARTIAL COLECTOMY N/A 01/24/2019   Procedure: PARTIAL COLECTOMY;  Surgeon: Clovis Riley, MD;  Location: [facility] OR;  Service: General;  Laterality: N/A;  :  Current Facility-Administered Medications  Medication Dose Route Frequency Provider Last Rate Last Dose  . 0.9 % NaCl with KCl 20 mEq/ L  infusion   Intravenous Continuous Rozann Lesches, RPH 100 mL/hr at 02/01/19 [id number]    . acetaminophen (TYLENOL) tablet 650 mg  650 mg Oral Q6H Focht, Jessica L, PA   650 mg at 02/01/19  0834  . alum & mag hydroxide-simeth (MAALOX/MYLANTA) 200-200-20 MG/5ML suspension 30 mL  30 mL Oral Q4H PRN Donne Hazel, MD   30 mL at 01/29/19 0038  . heparin injection 5,000 Units  5,000 Units Subcutaneous Q8H Focht, Jessica L, PA   5,000 Units at 02/01/19 0835  . hydrALAZINE (APRESOLINE) injection 10 mg  10 mg Intravenous Q4H PRN Donne Hazel, MD   10 mg at 01/30/19 0436  . hydrALAZINE (APRESOLINE) tablet 25 mg  25 mg Oral Q8H Donne Hazel, MD   25 mg at 02/01/19 0530  . ipratropium-albuterol (DUONEB) 0.5-2.5 (3) MG/3ML nebulizer solution 3 mL  3 mL Nebulization Q4H PRN Donne Hazel, MD   3 mL at 01/30/19 1341  . isosorbide mononitrate (IMDUR) 24 hr tablet 30 mg  30 mg Oral Daily Donne Hazel, MD   30 mg at 02/01/19 0834  . labetalol (NORMODYNE) injection 5 mg  5 mg Intravenous Q2H PRN Donne Hazel, MD   5 mg at 01/27/19 2206  . lisinopril (ZESTRIL) tablet 40 mg  40 mg Oral Daily Donne Hazel, MD   40 mg at 02/01/19 0834  . loperamide (IMODIUM) capsule 2 mg  2 mg Oral TID Meuth, Brooke A, PA-C   2 mg at 02/01/19 0834  . metoprolol tartrate (LOPRESSOR) tablet 50 mg  50 mg Oral BID Donne Hazel, MD   50 mg at 02/01/19 0835  . morphine 2 MG/ML injection 1 mg  1 mg Intravenous Q3H PRN Meuth, Brooke A, PA-C      . nicotine (NICODERM CQ - dosed in mg/24 hr) patch 7 mg  7 mg Transdermal Daily Debbe Odea, MD   7 mg at 02/01/19 0836  . ondansetron (ZOFRAN) tablet 4 mg  4 mg Oral Q6H PRN Focht, Jessica L, PA       Or  . ondansetron (ZOFRAN) injection 4 mg  4 mg Intravenous Q6H PRN Focht, Jessica L, PA   4 mg at 01/29/19 2238  . oxyCODONE (Oxy IR/ROXICODONE) immediate release tablet 5-10 mg  5-10 mg Oral Q4H PRN Meuth, Brooke A, PA-C      . polycarbophil (FIBERCON) tablet 625 mg  625 mg Oral Daily Saverio Danker, PA-C   625 mg at 02/01/19 0850     No Known Allergies:  Family History  Problem Relation Age of Onset  . Kidney failure Father   . Hypertension Father   .  Hypertension Sister   :  Social History   Socioeconomic History  . Marital status: Married    Spouse name: Not on file  . Number of children: Not on file  . Years of education: Not on file  . Highest education level: Not on file  Occupational History  . Not on file  Social Needs  . Financial resource strain: Not on file  . Food insecurity    Worry: Not on file    Inability: Not on file  . Transportation needs    Medical: Not on file  Non-medical: Not on file  Tobacco Use  . Smoking status: Current Every Day Smoker    Packs/day: 1.00    Years: 26.00    Pack years: 26.00    Types: Cigarettes  . Smokeless tobacco: Never Used  . Tobacco comment: desires patch  Substance and Sexual Activity  . Alcohol use: Yes    Comment: a pint a week  . Drug use: Never  . Sexual activity: Not on file  Lifestyle  . Physical activity    Days per week: Not on file    Minutes per session: Not on file  . Stress: Not on file  Relationships  . Social Herbalist on phone: Not on file    Gets together: Not on file    Attends religious service: Not on file    Active member of club or organization: Not on file    Attends meetings of clubs or organizations: Not on file    Relationship status: Not on file  . Intimate partner violence    Fear of current or ex partner: Not on file    Emotionally abused: Not on file    Physically abused: Not on file    Forced sexual activity: Not on file  Other Topics Concern  . Not on file  Social History Narrative  . Not on file  :  Review of Systems: A comprehensive 14 point review of systems was negative except as noted in the HPI.  Exam: Patient Vitals for the past 24 hrs:  BP Temp Temp src Pulse Resp SpO2  02/01/19 0508 (!) 159/73 98.2 F (36.8 C) Oral 71 18 97 %  01/31/19 2128 (!) 144/72 98.6 F (37 C) Oral 68 18 96 %  01/31/19 1345 (!) 157/60 98.9 F (37.2 C) Oral 86 15 94 %    General:  well-nourished in no acute distress.    Eyes:  no scleral icterus.   ENT:  There were no oropharyngeal lesions.   Neck was without thyromegaly.   Lymphatics:  Negative cervical, supraclavicular or axillary adenopathy.   Respiratory: lungs were clear bilaterally without wheezing or crackles.   Cardiovascular:  Regular rate and rhythm, S1/S2, without murmur, rub or gallop.  There was no pedal edema.   GI: Positive bowel sounds, soft, nontender, nondistended.  Ostomy with liquid brown stool.  Wound VAC in place. Musculoskeletal:  no spinal tenderness of palpation of vertebral spine.   Skin exam was without echymosis, petichae.   Neuro exam was nonfocal. Patient was alert and oriented.  Attention was good.   Language was appropriate.  Mood was normal without depression.  Speech was not pressured.  Thought content was not tangential.     Lab Results  Component Value Date   WBC 15.1 (H) 02/01/2019   HGB 9.0 (L) 02/01/2019   HCT 28.1 (L) 02/01/2019   PLT 325 02/01/2019   GLUCOSE 79 02/01/2019   ALT 12 01/31/2019   AST 14 (L) 01/31/2019   NA 136 02/01/2019   K 3.4 (L) 02/01/2019   CL 96 (L) 02/01/2019   CREATININE 1.01 (H) 02/01/2019   BUN 31 (H) 02/01/2019   CO2 27 02/01/2019    Dg Chest 2 View  Result Date: 01/25/2019 CLINICAL DATA:  Productive cough for several days. History of metastatic cancer. EXAM: CHEST - 2 VIEW COMPARISON:  05/02/2006 chest x-ray FINDINGS: The cardiac silhouette, mediastinal and hilar contours are within normal limits. Streaky areas of subsegmental atelectasis but no definite infiltrates or effusions.  No worrisome pulmonary lesions. There is an NG tube coursing down the esophagus and into the stomach. The bony thorax is intact. IMPRESSION: No acute cardiopulmonary findings. NG tube is in the stomach. Electronically Signed   By: Marijo Sanes M.D.   On: 01/25/2019 12:45   Dg Abd 1 View  Result Date: 01/26/2019 CLINICAL DATA:  NG tube placement EXAM: ABDOMEN - 1 VIEW COMPARISON:  January 23, 2019  FINDINGS: The bowel gas pattern is normal. Tip the NG tube is seen projecting over the proximal stomach. IMPRESSION: Tip of NG tube seen projecting over the proximal stomach. Electronically Signed   By: Prudencio Pair M.D.   On: 01/26/2019 00:45   Ct Abdomen Pelvis W Contrast  Result Date: 01/23/2019 CLINICAL DATA:  Acute abdominal pain with generalized small bowel obstruction. EXAM: CT ABDOMEN AND PELVIS WITH CONTRAST TECHNIQUE: Multidetector CT imaging of the abdomen and pelvis was performed using the standard protocol following bolus administration of intravenous contrast. CONTRAST:  100 cc OMNIPAQUE IOHEXOL 300 MG/ML  SOLN COMPARISON:  None. FINDINGS: Lower chest: Right coronary calcification. Mild right lower lobe scarring along endplate osteophytes. Hepatobiliary: Multiple partially calcified liver lesions consistent with metastatic disease given the constellation of findings, up to 25 mm in the subcapsular right liver.No evidence of biliary obstruction or stone. Pancreas: Unremarkable. Spleen: Unremarkable. Adrenals/Urinary Tract: Negative adrenals. No hydronephrosis or stone. Unremarkable bladder. Stomach/Bowel: Irregular mass at the mid transverse colon with proximal colonic and small bowel dilatation. There is lobulation along the serosal surface. No appendicitis. Vascular/Lymphatic: Mildly enlarged transverse mesocolon lymph node near the mass, 8 mm in diameter. No acute vascular finding Reproductive:Small uterine fibroids. Other: Tumor appears to extend through the serosa but there is no ascites or evident peritoneal nodularity. Musculoskeletal: Degenerative changes without acute or aggressive finding IMPRESSION: Obstructing mid transverse colonic mass with mild regional adenopathy and hepatic metastatic disease. The mass likely extends through the serosa; no ascites or peritoneal nodularity. Electronically Signed   By: Monte Fantasia M.D.   On: 01/23/2019 07:48   Dg Abdomen Acute W/chest  Result  Date: 01/23/2019 CLINICAL DATA:  Constipation. Cough. Nausea and vomiting. EXAM: DG ABDOMEN ACUTE W/ 1V CHEST COMPARISON:  None. FINDINGS: Lungs are clear. Normal cardiomediastinal contours. No focal airspace disease. No pleural effusion. Dilated small bowel with air-fluid levels. Minimal stool in the rectosigmoid colon, otherwise no significant formed stool. No evidence of free air. No radiopaque calculi or abnormal soft tissue calcifications. No osseous abnormalities. IMPRESSION: 1. Bowel-gas pattern consistent with small bowel obstruction. No free air. 2. No acute chest findings. Electronically Signed   By: Keith Rake M.D.   On: 01/23/2019 05:51   Dg Abd Portable 1 View  Result Date: 01/23/2019 CLINICAL DATA:  Nasogastric tube placement. EXAM: PORTABLE ABDOMEN - 1 VIEW COMPARISON:  Same day. FINDINGS: Nasogastric tube tip is seen in proximal stomach. Residual intravenous contrast is seen within the nondilated renal collecting systems. Mildly dilated small bowel loops are noted concerning for distal small bowel obstruction. No large bowel dilatation is noted. IMPRESSION: Distal tip of nasogastric tube seen in proximal stomach. Mildly dilated small bowel loops are noted concerning for distal small bowel obstruction. Electronically Signed   By: Marijo Conception M.D.   On: 01/23/2019 08:35     Dg Chest 2 View  Result Date: 01/25/2019 CLINICAL DATA:  Productive cough for several days. History of metastatic cancer. EXAM: CHEST - 2 VIEW COMPARISON:  05/02/2006 chest x-ray FINDINGS: The cardiac silhouette, mediastinal and hilar  contours are within normal limits. Streaky areas of subsegmental atelectasis but no definite infiltrates or effusions. No worrisome pulmonary lesions. There is an NG tube coursing down the esophagus and into the stomach. The bony thorax is intact. IMPRESSION: No acute cardiopulmonary findings. NG tube is in the stomach. Electronically Signed   By: Marijo Sanes M.D.   On:  01/25/2019 12:45   Dg Abd 1 View  Result Date: 01/26/2019 CLINICAL DATA:  NG tube placement EXAM: ABDOMEN - 1 VIEW COMPARISON:  January 23, 2019 FINDINGS: The bowel gas pattern is normal. Tip the NG tube is seen projecting over the proximal stomach. IMPRESSION: Tip of NG tube seen projecting over the proximal stomach. Electronically Signed   By: Prudencio Pair M.D.   On: 01/26/2019 00:45   Ct Abdomen Pelvis W Contrast  Result Date: 01/23/2019 CLINICAL DATA:  Acute abdominal pain with generalized small bowel obstruction. EXAM: CT ABDOMEN AND PELVIS WITH CONTRAST TECHNIQUE: Multidetector CT imaging of the abdomen and pelvis was performed using the standard protocol following bolus administration of intravenous contrast. CONTRAST:  100 cc OMNIPAQUE IOHEXOL 300 MG/ML  SOLN COMPARISON:  None. FINDINGS: Lower chest: Right coronary calcification. Mild right lower lobe scarring along endplate osteophytes. Hepatobiliary: Multiple partially calcified liver lesions consistent with metastatic disease given the constellation of findings, up to 25 mm in the subcapsular right liver.No evidence of biliary obstruction or stone. Pancreas: Unremarkable. Spleen: Unremarkable. Adrenals/Urinary Tract: Negative adrenals. No hydronephrosis or stone. Unremarkable bladder. Stomach/Bowel: Irregular mass at the mid transverse colon with proximal colonic and small bowel dilatation. There is lobulation along the serosal surface. No appendicitis. Vascular/Lymphatic: Mildly enlarged transverse mesocolon lymph node near the mass, 8 mm in diameter. No acute vascular finding Reproductive:Small uterine fibroids. Other: Tumor appears to extend through the serosa but there is no ascites or evident peritoneal nodularity. Musculoskeletal: Degenerative changes without acute or aggressive finding IMPRESSION: Obstructing mid transverse colonic mass with mild regional adenopathy and hepatic metastatic disease. The mass likely extends through the  serosa; no ascites or peritoneal nodularity. Electronically Signed   By: Monte Fantasia M.D.   On: 01/23/2019 07:48   Dg Abdomen Acute W/chest  Result Date: 01/23/2019 CLINICAL DATA:  Constipation. Cough. Nausea and vomiting. EXAM: DG ABDOMEN ACUTE W/ 1V CHEST COMPARISON:  None. FINDINGS: Lungs are clear. Normal cardiomediastinal contours. No focal airspace disease. No pleural effusion. Dilated small bowel with air-fluid levels. Minimal stool in the rectosigmoid colon, otherwise no significant formed stool. No evidence of free air. No radiopaque calculi or abnormal soft tissue calcifications. No osseous abnormalities. IMPRESSION: 1. Bowel-gas pattern consistent with small bowel obstruction. No free air. 2. No acute chest findings. Electronically Signed   By: Keith Rake M.D.   On: 01/23/2019 05:51   Dg Abd Portable 1 View  Result Date: 01/23/2019 CLINICAL DATA:  Nasogastric tube placement. EXAM: PORTABLE ABDOMEN - 1 VIEW COMPARISON:  Same day. FINDINGS: Nasogastric tube tip is seen in proximal stomach. Residual intravenous contrast is seen within the nondilated renal collecting systems. Mildly dilated small bowel loops are noted concerning for distal small bowel obstruction. No large bowel dilatation is noted. IMPRESSION: Distal tip of nasogastric tube seen in proximal stomach. Mildly dilated small bowel loops are noted concerning for distal small bowel obstruction. Electronically Signed   By: Marijo Conception M.D.   On: 01/23/2019 08:35    Pathology:  SURGICAL PATHOLOGY  CASE: [id number]  PATIENT: Stephens November  Surgical Pathology Report   Clinical History: obstructive colon mass (  cm)   And Cancer started requiring 130 out usually around still on the vent.   FINAL MICROSCOPIC DIAGNOSIS:   A. COLON, TRANSVERSE, RESECTION:  Colonic adenocarcinoma, 5 cm.  Carcinoma extends into pericolonic connective tissue and focally to  serosal surface.  Margins not involved.  Metastatic  carcinoma in one of thirteen lymph nodes (1/13).   B. LIVER NODULE, LEFT, BIOPSY:  Metastatic adenocarcinoma.   Oncology table:  COLON AND RECTUM: Resection, Including Transanal Disk Excision of  Rectal Neoplasms   Procedure: Partial colectomy.  Tumor Site: Transverse colon.  Tumor Size: 5 cm.  Macroscopic Tumor Perforation: Not identified.  Histologic Type: Adenocarcinoma.  Histologic Grade: Moderately differentiated, grade 2.  Tumor Extension: Into pericolonic connective tissue and focally involves  serosal surface.  Margins: Free of tumor.  Treatment Effect: No known presurgical therapy.  Lymphovascular Invasion: Present.  Perineural Invasion: Not identified.  Tumor Deposits: Not identified.  Regional Lymph Nodes:    Number of Lymph Nodes Involved: 1    Number of Lymph Nodes Examined: 13  Pathologic Stage Classification (pTNM, AJCC 8th Edition): pT4a, pN1a,  pM1a  Ancillary Studies: Pending  Representative Tumor Block: A3, A4, A5 and A6  Comments: The carcinoma extends into the pericolonic connective tissue  and there is focal, microscopic involvement of the visceral peritoneum.   Assessment and Plan:  This is a 58 year old female with:  1.  Metastatic colon adenocarcinoma with liver metastases: Discussed the diagnosis with the patient and her daughter.  She understands that she has metastatic colon cancer.  While this disease cannot be cured, we can offer treatment to control the disease.  I would recommend obtaining a CT scan of the chest with contrast to complete her staging work-up.  The patient understands that we will need to wait a little while prior to beginning any treatment for her to heal from her surgery.  We can arrange for outpatient follow-up at the cancer center upon discharge.  2.  Microcytic anemia: Patient has a low ferritin of 10 and a low percent saturation of 7% from labs drawn on 01/24/2019.  Recommend Feraheme 510 mg IV for treatment of iron  deficiency anemia.  3.  Acute kidney injury: Likely due to ostomy output (had greater than 2 L out from ostomy yesterday).  Continue IV fluids per hospitalist.  4.  Hypokalemia: Likely due to increased ostomy output yesterday.  Replete potassium per hospitalist.  5.  Tobacco dependence: Smoking cessation was recommended.  We will continue to follow-up and support the patient.  Thank you for this referral.   Mikey Bussing, DNP, AGPCNP-BC, AOCNP   Oncology addendum:  Patient seen and examined personally.  Please see the note by Altamese Dilling DNP.  The 59 year old woman with documented stage IV colon cancer after presenting with abdominal pain and subsequently underwent transverse colon resection as well as hepatic lesion biopsy.  Clinically, she is recovering reasonably well at this time although she still has increased outputs from her colostomy.  She denies any abdominal pain or discomfort.  Denies any shortness of breath or difficulty breathing.  On exam she was awake and alert without any distress.  Her abdomen was soft without any hepatosplenomegaly.  CT scan of the abdomen done on 1020 was personally reviewed today which showed no evidence of metastatic disease beyond the regional lymphadenopathy as well as hepatic lesions.  The finding of the pathology was discussed today with the patient and her daughter.  She appears to be a good candidate for  aggressive multiagent systemic chemotherapy in the setting of a multidisciplinary approach for a potentially resectable hepatic metastasis.  Her case will be discussed at the GI tumor board and follow-up with oncology will be arranged for him on her discharge.  She understands that she will likely require a course of systemic chemotherapy and if she has an excellent response potential hepatic resection or potentially radiofrequency ablation would be considered at that time pending the discussion of the GI tumor board.  All her questions were  answered today to her satisfaction.

## 2019-02-01 NOTE — Progress Notes (Signed)
Physical Therapy Treatment Patient Details Name: Casey Black MRN: [id number] DOB: 17-Mar-1961 Today's Date: 02/01/2019    History of Present Illness Pt is a 58 yo female presenting with a small bowel obstruction (colonic mass with likely kidney mets) and s/p colostomy with liver biopsy on 10/19. No known PMH as pt reportedly avoids doctors.    PT Comments    Pt demos sig improvement in tolerance for ambulation and mobility. Pt reports she was ambulating with daughter in hallway prior to PT session, and was able to complete another lap around the unit with no change in pain or fatigue. The patient ambulates with a gait speed of 0.86m/s using a RW and with supervision, which is a sig increase from prior sessions, but still indicates increased risk of falls. The patient also completed a 5xsit-to-stand (5XSTS) in 18s with use of BUE but no assistance which is above the age-based cut-off of 11.4s and therefore also indicates they are at increased risk for falls. The patient will therefore continue to benefit from skilled PT to address limitations in functional strength, endurance, and balance.    Follow Up Recommendations  Outpatient PT;Supervision/Assistance - 24 hour     Equipment Recommendations  Rolling walker with 5" wheels;3in1 (PT)    Recommendations for Other Services       Precautions / Restrictions Precautions Precautions: None;Fall Precaution Comments: colostomy RUQ, wound VAC Restrictions Weight Bearing Restrictions: No    Mobility  Bed Mobility               General bed mobility comments: not completed today, pt OOB in chair upon PT arrival  Transfers Overall transfer level: Needs assistance Equipment used: Rolling walker (2 wheeled) Transfers: Sit to/from Stand Sit to Stand: Supervision         General transfer comment: Pt needed min VCs for positioning of RW and hand placement, but was able to stand without physical assist, only supervision for  safety  Ambulation/Gait Ambulation/Gait assistance: Supervision Gait Distance (Feet): 250 Feet Assistive device: Rolling walker (2 wheeled) Gait Pattern/deviations: Step-through pattern;WFL(Within Functional Limits) Gait velocity: 0.67 m/s   General Gait Details: Pt ambulates with good gait pattern and use of RW, can possibly progress to no AD next time.   Stairs             Wheelchair Mobility    Modified Rankin (Stroke Patients Only)       Balance Overall balance assessment: Needs assistance Sitting-balance support: Feet supported Sitting balance-Leahy Scale: Good     Standing balance support: Bilateral upper extremity supported Standing balance-Leahy Scale: Fair Standing balance comment: Pt able to stand without BUE support, needs support for mobility                            Cognition Arousal/Alertness: Awake/alert Behavior During Therapy: WFL for tasks assessed/performed Overall Cognitive Status: Within Functional Limits for tasks assessed                                        Exercises      General Comments        Pertinent Vitals/Pain Pain Assessment: No/denies pain    Home Living                      Prior Function  PT Goals (current goals can now be found in the care plan section) Acute Rehab PT Goals Patient Stated Goal: to return home and feel more steady on her feet PT Goal Formulation: With patient Time For Goal Achievement: 02/13/19 Potential to Achieve Goals: Good Progress towards PT goals: Progressing toward goals    Frequency    Min 3X/week      PT Plan Discharge plan needs to be updated    Co-evaluation              AM-PAC PT "6 Clicks" Mobility   Outcome Measure  Help needed turning from your back to your side while in a flat bed without using bedrails?: None Help needed moving from lying on your back to sitting on the side of a flat bed without using  bedrails?: A Little Help needed moving to and from a bed to a chair (including a wheelchair)?: A Little Help needed standing up from a chair using your arms (e.g., wheelchair or bedside chair)?: A Little Help needed to walk in hospital room?: A Little Help needed climbing 3-5 steps with a railing? : A Little 6 Click Score: 19    End of Session Equipment Utilized During Treatment: Gait belt Activity Tolerance: Patient tolerated treatment well;No increased pain Patient left: in chair;with call bell/phone within reach;with family/visitor present Nurse Communication: Mobility status PT Visit Diagnosis: Difficulty in walking, not elsewhere classified (R26.2);Unsteadiness on feet (R26.81)     Time: [id number] PT Time Calculation (min) (ACUTE ONLY): 18 min  Charges:  $Gait Training: 8-22 mins                     Mickey Farber, PT, DPT   Acute Rehabilitation Department [phone]   Otho Bellows 02/01/2019, 3:31 PM

## 2019-02-02 ENCOUNTER — Telehealth: Payer: Self-pay | Admitting: Hematology

## 2019-02-02 DIAGNOSIS — C189 Malignant neoplasm of colon, unspecified: Secondary | ICD-10-CM

## 2019-02-02 DIAGNOSIS — D509 Iron deficiency anemia, unspecified: Secondary | ICD-10-CM

## 2019-02-02 LAB — IRON AND TIBC
Iron: 7 ug/dL — ABNORMAL LOW (ref 28–170)
Saturation Ratios: 3 % — ABNORMAL LOW (ref 10.4–31.8)
TIBC: 259 ug/dL (ref 250–450)
UIBC: 252 ug/dL

## 2019-02-02 LAB — BASIC METABOLIC PANEL
Anion gap: 11 (ref 5–15)
BUN: 21 mg/dL — ABNORMAL HIGH (ref 6–20)
CO2: 24 mmol/L (ref 22–32)
Calcium: 8.2 mg/dL — ABNORMAL LOW (ref 8.9–10.3)
Chloride: 101 mmol/L (ref 98–111)
Creatinine, Ser: 0.82 mg/dL (ref 0.44–1.00)
GFR calc Af Amer: >60 mL/min (ref >60)
GFR calc non Af Amer: >60 mL/min (ref >60)
Glucose, Bld: 84 mg/dL (ref 70–99)
Potassium: 4.2 mmol/L (ref 3.5–5.1)
Sodium: 136 mmol/L (ref 135–145)

## 2019-02-02 LAB — CBC WITH DIFFERENTIAL/PLATELET
Abs Immature Granulocytes: 0.08 10*3/uL — ABNORMAL HIGH (ref 0.00–0.07)
Basophils Absolute: 0 10*3/uL (ref 0.0–0.1)
Basophils Relative: 0 %
Eosinophils Absolute: 0.2 10*3/uL (ref 0.0–0.5)
Eosinophils Relative: 1 %
HCT: 26.9 % — ABNORMAL LOW (ref 36.0–46.0)
Hemoglobin: 8.7 g/dL — ABNORMAL LOW (ref 12.0–15.0)
Immature Granulocytes: 1 %
Lymphocytes Relative: 14 %
Lymphs Abs: 1.7 10*3/uL (ref 0.7–4.0)
MCH: 23.3 pg — ABNORMAL LOW (ref 26.0–34.0)
MCHC: 32.3 g/dL (ref 30.0–36.0)
MCV: 71.9 fL — ABNORMAL LOW (ref 80.0–100.0)
Monocytes Absolute: 0.7 10*3/uL (ref 0.1–1.0)
Monocytes Relative: 6 %
Neutro Abs: 9.8 10*3/uL — ABNORMAL HIGH (ref 1.7–7.7)
Neutrophils Relative %: 78 %
Platelets: 340 10*3/uL (ref 150–400)
RBC: 3.74 MIL/uL — ABNORMAL LOW (ref 3.87–5.11)
RDW: 18.2 % — ABNORMAL HIGH (ref 11.5–15.5)
WBC: 12.5 10*3/uL — ABNORMAL HIGH (ref 4.0–10.5)
nRBC: 0 % (ref 0.0–0.2)

## 2019-02-02 LAB — FOLATE: Folate: 7.5 ng/mL (ref >5.9)

## 2019-02-02 LAB — RETICULOCYTES
Immature Retic Fract: 13.5 % (ref 2.3–15.9)
RBC.: 3.95 MIL/uL (ref 3.87–5.11)
Retic Count, Absolute: 35.6 10*3/uL (ref 19.0–186.0)
Retic Ct Pct: 0.9 % (ref 0.4–3.1)

## 2019-02-02 LAB — FERRITIN: Ferritin: 75 ng/mL (ref 11–307)

## 2019-02-02 LAB — VITAMIN B12: Vitamin B-12: 1025 pg/mL — ABNORMAL HIGH (ref 180–914)

## 2019-02-02 NOTE — Progress Notes (Signed)
Patient ID: Casey Black, female   DOB: 01/27/1961, 58 y.o.   MRN: [id number]    9 Days Post-Op  Subjective: Patient continues to feel well today.  Tolerating her solid diet.  Output is recorded as down to 800cc yesterday from ostomy.  Still looks quite loose today.  No significant abdominal pain  ROS: See above, otherwise other systems negative  Objective: Vital signs in last 24 hours: Temp:  [98 F (36.7 C)-98.4 F (36.9 C)] 98.4 F (36.9 C) (10/30 0518) Pulse Rate:  [74-88] 76 (10/30 0518) Resp:  [19-20] 20 (10/30 0518) BP: (140-191)/(64-76) 178/76 (10/30 0518) SpO2:  [95 %-98 %] 98 % (10/30 0518) Last BM Date: 02/02/19  Intake/Output from previous day: 10/29 0701 - 10/30 0700 In: 1604.7 [P.O.:830; I.V.:774.7] Out: 1800 [Urine:900; Drains:100; Stool:800] Intake/Output this shift: No intake/output data recorded.  PE: Abd: soft, NT, ND, colostomy with enteric liquid stool present.  Stoma is pink and viable.  Midline wound with VAC in place, good BS  Lab Results:  Recent Labs    02/01/19 0122 02/02/19 0345  WBC 15.1* 12.5*  HGB 9.0* 8.7*  HCT 28.1* 26.9*  PLT 325 340   BMET Recent Labs    02/01/19 0122 02/02/19 0345  NA 136 136  K 3.4* 4.2  CL 96* 101  CO2 27 24  GLUCOSE 79 84  BUN 31* 21*  CREATININE 1.01* 0.82  CALCIUM 8.4* 8.2*   PT/INR No results for input(s): LABPROT, INR in the last 72 hours. CMP     Component Value Date/Time   NA 136 02/02/2019 0345   K 4.2 02/02/2019 0345   CL 101 02/02/2019 0345   CO2 24 02/02/2019 0345   GLUCOSE 84 02/02/2019 0345   BUN 21 (H) 02/02/2019 0345   CREATININE 0.82 02/02/2019 0345   CALCIUM 8.2 (L) 02/02/2019 0345   PROT 6.2 (L) 01/31/2019 0350   ALBUMIN 2.2 (L) 01/31/2019 0350   AST 14 (L) 01/31/2019 0350   ALT 12 01/31/2019 0350   ALKPHOS 68 01/31/2019 0350   BILITOT 0.4 01/31/2019 0350   GFRNONAA >60 02/02/2019 0345   GFRAA >60 02/02/2019 0345   Lipase     Component Value Date/Time   LIPASE 17  01/23/2019 0307       Studies/Results: No results found.  Anti-infectives: Anti-infectives (From admission, onward)   Start     Dose/Rate Route Frequency Ordered Stop   01/24/19 1030  cefoTEtan (CEFOTAN) 1 g in sodium chloride 0.9 % 100 mL IVPB     1 g 200 mL/hr over 30 Minutes Intravenous On call to O.R. 01/23/19 1344 01/24/19 1146       Assessment/Plan HTN - new on admission; per medicine Acute blood loss anemia- hgb9.5, stable ARI - resolved with fluid hydration.  Will decrease to 50cc/hr today.  If output still remains under 1L then should be safe for DC tomorrow. Hypokalemia - being replaced by medicine as well.  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 Kae Heller, 10/21 - POD#9 -appreciate onc eval and patient will be discussed at GI tumor board and will have follow up arranged as outpatient. -soft diet -wound VAC approved by [facility], appreciate WOC assistance with VAC changes -mobilize and IS -therapy recommends HH PT -high colostomy output. This is improving on fiber and imodium.   -closely monitor to avoid renal failure and hypokalemia secondary to GI loss; however, this seems to have resolved so far.  If her output stays under 1L in the next 24 hrs she should be stable for DC home on imodium and fiber.  [id number], IVFs decrease to 50cc/hr today VTE: SCD's,sqheparin ID:pre-op only Foley:DC'd 10/22 Follow up:Dr. Kae Heller   LOS: 10 days    Henreitta Cea , [facility] Surgery 02/02/2019, 8:31 AM Please see Amion for pager number during day hours 7:00am-4:30pm

## 2019-02-02 NOTE — Consult Note (Signed)
[place] Nurse wound consult note Reason for Consult:  NPWT dressing change to midline wound Wound type: Surgical Pressure Injury POA: N/A Measurement: 18cm x 7cm x 2.5cm Wound bed: 100% red, moist Drainage (amount, consistency, odor) small amount serosanguinous following dressing removal Periwound:intact. No erythema, no induration or warmth.   Dressing procedure/placement/frequency: Three times weekly NPWT dressing changes are ordered.  Next scheduled dressing change is Monday, 02/05/19.    Periwound prepared with liquid barrier film application (Cavillon) and allowed to dry.  Periwound then covered with strips of drape. A portion of a skin barrier ring is used to line the wound at the most distal periphery.  One piece of black foam used to obliterate dead space, this is covered with drape and attached to 161mHg continuous negative pressure. An immediate seal is achieved and patient tolerated procedure well after being premedicated. The tubing is labeled with the # and type of wound contact layers beneath drape.  A Medium VAC dressing kit is ordered today for Monday's change.  [place] ostomy follow up Stoma type/location: RUQ ascending colostomy. Currently exhibiting moderately high output. 2010m emptied from pouch prior to change Stomal assessment/size: 2 inches, red, raised, edematous, moist with os at center Peristomal assessment: intact, macerated secondary to leakage behind skin barrier  Treatment options for stomal/peristomal skin: skin barrier ring Output: yellow-gold effluent with stool particles Ostomy pouching: 2pc. 2 and 3/4 inch pouching system with skin barrier ring Education provided: Patient teaching reinforced specific to notifying nursing staff when pouch is 1/3 to 1/2 full of air or effluent so that over-filling and subsequent leakages do not occur. [place]: Yes, previously  Ostomy supplies are ordered to be  replenished.  5 skin barriers, 5 rings, 5 pouches and 5 high output pouches. High Output Pouch is LaKellie [phone]  WOOmakursing team will continue to follow along with you. Next planned visit on Monday, 02/05/19  I appreciated the assistance of SWOT RN, KrMarita Kansasho obtained and administered the PRN pain medication just prior to VAUmm [facility] Surgery Centersressing change.  Thanks, LaMaudie FlakesMSN, RN, GNRussellvilleCWArther AbbottPager# [phone]

## 2019-02-02 NOTE — Plan of Care (Signed)
  Problem: Education: Goal: Knowledge of General Education information will improve Description: Including pain rating scale, medication(s)/side effects and non-pharmacologic comfort measures Outcome: Progressing   Problem: Clinical Measurements: Goal: Ability to maintain clinical measurements within normal limits will improve Outcome: Progressing Goal: Will remain free from infection Outcome: Progressing Goal: Diagnostic test results will improve Outcome: Progressing Goal: Respiratory complications will improve Outcome: Progressing Goal: Cardiovascular complication will be avoided Outcome: Progressing   Problem: Activity: Goal: Risk for activity intolerance will decrease Outcome: Progressing   Problem: Nutrition: Goal: Adequate nutrition will be maintained Outcome: Progressing   Problem: Coping: Goal: Level of anxiety will decrease Outcome: Progressing   Problem: Elimination: Goal: Will not experience complications related to bowel motility Outcome: Progressing Goal: Will not experience complications related to urinary retention Outcome: Progressing   Problem: Pain Managment: Goal: General experience of comfort will improve Outcome: Progressing   Problem: Safety: Goal: Ability to remain free from injury will improve Outcome: Progressing   Problem: Skin Integrity: Goal: Risk for impaired skin integrity will decrease Outcome: Progressing   Problem: Education: Goal: Required Educational Video(s) Outcome: Progressing   Problem: Clinical Measurements: Goal: Ability to maintain clinical measurements within normal limits will improve Outcome: Progressing Goal: Postoperative complications will be avoided or minimized Outcome: Progressing   Problem: Skin Integrity: Goal: Demonstration of wound healing without infection will improve Outcome: Progressing

## 2019-02-02 NOTE — Telephone Encounter (Signed)
Not able to reach patient at number in Epic. Called [facility] 6N inpatient unit. Nurse currently with patient changing dressing. Will call back re 11/12 hospital follow up appointment with Dr. Burr Medico.   Appointment scheduled for week of 11/9 per staff message.

## 2019-02-02 NOTE — TOC Progression Note (Signed)
Transition of Care [facility]) - Progression Note    Patient Details  Name: Casey Black MRN: [id number] Date of Birth: [birth year]  Transition of Care [facility]) CM/SW Contact  Jacalyn Lefevre Edson Snowball, RN Phone Number: 02/02/2019, 1:50 PM  Clinical Narrative:     Possible discharge home tomorrow 02/03/19 . [name] with [facility] aware. Start of care for [facility] will be Monday 02/05/19  Expected Discharge Plan: [place] Barriers to Discharge: Continued Medical Work up  Expected Discharge Plan and Services Expected Discharge Plan: [place]   Discharge Planning Services: CM Consult Post Acute Care Choice: [place] arrangements for the past 2 months: Single Family Home                 DME Arranged: N/A         HH Arranged: RN           Social Determinants of Health (SDOH) Interventions    Readmission Risk Interventions No flowsheet data found.

## 2019-02-02 NOTE — Telephone Encounter (Signed)
Spoke with patient on inpatient unit and confirmed 11/12 hosp f/u appointment.

## 2019-02-02 NOTE — Progress Notes (Signed)
PROGRESS NOTE    KADASIA CHISENHALL   [id number]  DOB: 1961/03/19  DOA: 01/23/2019 PCP: Patient, No Pcp Per   Brief Narrative:  [name] 58 y.o.femalewithno known pastmedical history significant(does not see doctors) presenting with abdominal pain, nausea and vomiting.She felt constipated and took 2 Senokot. About 0100, she developed n/v and was unable to stop and so her daughter brought her in. The symptoms started maybe 2 months ago but was intermittent. She has had marked unintentional weight loss in the last few months. She was admitted for a SBO due to a mass obstructing the transverse colon with what appeared to be hepatic metastasis.   Subjective: She has no complaints. She has been ambulating in the hall and eating and drinking well. She states she drank about 1 L of fluid yesterday. I have explained that she needs to increase her fluid intake more.     Assessment & Plan:   Principal Problem:   SBO (small bowel obstruction)/colon mass with liver metastasis - 10/21- she underwent transverse colectomy with end colostomy and liver biopsy -colon biopsy reveals: Adenocarcinoma with metastasis to 1 out of 13 lymph nodes -Liver biopsy also reveals metastatic adenocarcinoma - have requested an oncology eval - NG removed 10/25 - PCA stopped on 10/26 - diet advanced - 10/28 wound vac applied to wound - starting 10/28, noted to have a large amount of volume coming out of the colostomy ( ` 2 L) -  - c diff checked by general surgery and negative- she has been started on Lomotil and fiber - her stool outpt has lessened today - she will increase her oral fluid intake more today and general surgery has cut back on her IVF - hopefully home tomorrow  - oncology evaluated the patient on 10/29- they have offered her chemo and will see her in the outpatient setting  Active Problems:  AKI and hypokalemia -Due to diarrhea - replaced- Mg normal  Leukocytosis - no  infectious cause found - follow     Hypertension -Continue hydralazine, isosorbide mononitrate, lisinopril and metoprolol - BP likely elevated more than her baseline due to NS infusion- will not adjust meds today  Anemia- possibly due to acute blood loss - Hb 11-12 early on in the admission- now 8-9 - check anemia panel    Tobacco dependence -Weaned nicotine patch down to 7 mg -counseled   Time spent in minutes: 35 DVT prophylaxis: Heparin Code Status: Full code Family Communication:  Disposition Plan: Home Consultants:   General surgery  Oncology Procedures:   Colectomy and colostomy Antimicrobials:  Anti-infectives (From admission, onward)   Start     Dose/Rate Route Frequency Ordered Stop   01/24/19 1030  cefoTEtan (CEFOTAN) 1 g in sodium chloride 0.9 % 100 mL IVPB     1 g 200 mL/hr over 30 Minutes Intravenous On call to O.R. 01/23/19 1344 01/24/19 1146       Objective: Vitals:   02/01/19 0508 02/01/19 1551 02/01/19 2207 02/02/19 0518  BP: (!) 159/73 140/64 (!) 191/67 (!) 178/76  Pulse: 71 74 88 76  Resp: 18 20 19 20   Temp: 98.2 F (36.8 C) 98 F (36.7 C) 98.4 F (36.9 C) 98.4 F (36.9 C)  TempSrc: Oral Oral Oral Oral  SpO2: 97% 98% 95% 98%  Weight:      Height:        Intake/Output Summary (Last 24 hours) at 02/02/2019 1334 Last data filed at 02/02/2019 1100 Gross per 24 hour  Intake  530 ml  Output 1551 ml  Net -1021 ml   Filed Weights   01/24/19 1036  Weight: 92.1 kg    Examination: General exam: Appears comfortable  HEENT: PERRLA, oral mucosa moist, no sclera icterus or thrush Respiratory system: Clear to auscultation. Respiratory effort normal. Cardiovascular system: S1 & S2 heard,  No murmurs  Gastrointestinal system: Abdomen soft, non-tender, nondistended. Normal bowel sounds  - colostomy with liquid stool- wound vac present Central nervous system: Alert and oriented. No focal neurological deficits. Extremities: No cyanosis, clubbing  or edema Skin: No rashes or ulcers Psychiatry:  Mood & affect appropriate.   Data Reviewed: I have personally reviewed following labs and imaging studies  CBC: Recent Labs  Lab 01/28/19 0323 01/29/19 0348 01/31/19 0350 02/01/19 0122 02/02/19 0345  WBC 8.0 9.7 13.1* 15.1* 12.5*  NEUTROABS  --   --   --   --  9.8*  HGB 9.0* 9.2* 9.5* 9.0* 8.7*  HCT 28.7* 29.2* 29.9* 28.1* 26.9*  MCV 75.1* 73.9* 72.6* 72.1* 71.9*  PLT 249 276 328 325 [id number]   Basic Metabolic Panel: Recent Labs  Lab 01/27/19 0807  01/29/19 0348 01/30/19 0133 01/31/19 0350 02/01/19 0122 02/02/19 0345  NA  --    < > 140 141 136 136 136  K  --    < > 3.4* 3.6 2.7* 3.4* 4.2  CL  --    < > 99 96* 92* 96* 101  CO2  --    < > 30 32 33* 27 24  GLUCOSE  --    < > 110* 96 102* 79 84  BUN  --    < > 14 18 30* 31* 21*  CREATININE  --    < > 0.86 0.86 1.37* 1.01* 0.82  CALCIUM  --    < > 8.8* 9.3 9.0 8.4* 8.2*  MG 2.4  --   --   --  1.9  --   --    < > = values in this interval not displayed.   GFR: Estimated Creatinine Clearance: 87.1 mL/min (by C-G formula based on SCr of 0.82 mg/dL). Liver Function Tests: Recent Labs  Lab 01/27/19 0137 01/28/19 0323 01/31/19 0350  AST 28 21 14*  ALT 16 16 12   ALKPHOS 52 58 68  BILITOT 0.6 0.8 0.4  PROT 5.9* 6.2* 6.2*  ALBUMIN 2.3* 2.3* 2.2*   No results for input(s): LIPASE, AMYLASE in the last 168 hours. No results for input(s): AMMONIA in the last 168 hours. Coagulation Profile: No results for input(s): INR, PROTIME in the last 168 hours. Cardiac Enzymes: No results for input(s): CKTOTAL, CKMB, CKMBINDEX, TROPONINI in the last 168 hours. BNP (last 3 results) No results for input(s): PROBNP in the last 8760 hours. HbA1C: No results for input(s): HGBA1C in the last 72 hours. CBG: No results for input(s): GLUCAP in the last 168 hours. Lipid Profile: No results for input(s): CHOL, HDL, LDLCALC, TRIG, CHOLHDL, LDLDIRECT in the last 72 hours. Thyroid Function Tests:  No results for input(s): TSH, T4TOTAL, FREET4, T3FREE, THYROIDAB in the last 72 hours. Anemia Panel: No results for input(s): VITAMINB12, FOLATE, FERRITIN, TIBC, IRON, RETICCTPCT in the last 72 hours. Urine analysis: No results found for: COLORURINE, APPEARANCEUR, LABSPEC, PHURINE, GLUCOSEU, HGBUR, BILIRUBINUR, KETONESUR, PROTEINUR, UROBILINOGEN, NITRITE, LEUKOCYTESUR Sepsis Labs: @LABRCNTIP (procalcitonin:4,lacticidven:4) ) Recent Results (from the past 240 hour(s))  Surgical pcr screen     Status: None   Collection Time: 01/23/19  6:51 PM   Specimen: Nasal Mucosa; Nasal  Swab  Result Value Ref Range Status   MRSA, PCR NEGATIVE NEGATIVE Final   Staphylococcus aureus NEGATIVE NEGATIVE Final    Comment: (NOTE) The Xpert SA Assay (FDA approved for NASAL specimens in patients 27 years of age and older), is one component of a comprehensive surveillance program. It is not intended to diagnose infection nor to guide or monitor treatment. Performed at [facility], [place] [address], [place], [place] [address]   C difficile quick scan w PCR reflex     Status: None   Collection Time: 01/31/19  2:04 PM   Specimen: STOOL  Result Value Ref Range Status   C Diff antigen NEGATIVE NEGATIVE Final   C Diff toxin NEGATIVE NEGATIVE Final   C Diff interpretation No C. difficile detected.  Final    Comment: Performed at [facility], [place] [address], [place], [place] [address]         Radiology Studies: No results found.    Scheduled Meds: . acetaminophen  650 mg Oral Q6H  . heparin  5,000 Units Subcutaneous Q8H  . hydrALAZINE  25 mg Oral Q8H  . isosorbide mononitrate  30 mg Oral Daily  . lisinopril  40 mg Oral Daily  . loperamide  2 mg Oral TID  . metoprolol tartrate  50 mg Oral BID  . nicotine  7 mg Transdermal Daily  . polycarbophil  625 mg Oral Daily   Continuous Infusions: . 0.9 % NaCl with KCl 20 mEq / L 50 mL/hr at 02/02/19 0908     LOS: 10 days      Debbe Odea, MD [facility] Pager: [email or web address] Password [email or web address] 02/02/2019, 1:34 PM

## 2019-02-03 DIAGNOSIS — C787 Secondary malignant neoplasm of liver and intrahepatic bile duct: Secondary | ICD-10-CM

## 2019-02-03 DIAGNOSIS — C189 Malignant neoplasm of colon, unspecified: Secondary | ICD-10-CM

## 2019-02-03 LAB — BASIC METABOLIC PANEL
Anion gap: 12 (ref 5–15)
BUN: 14 mg/dL (ref 6–20)
CO2: 20 mmol/L — ABNORMAL LOW (ref 22–32)
Calcium: 8.4 mg/dL — ABNORMAL LOW (ref 8.9–10.3)
Chloride: 102 mmol/L (ref 98–111)
Creatinine, Ser: 0.77 mg/dL (ref 0.44–1.00)
GFR calc Af Amer: >60 mL/min (ref >60)
GFR calc non Af Amer: >60 mL/min (ref >60)
Glucose, Bld: 103 mg/dL — ABNORMAL HIGH (ref 70–99)
Potassium: 4.2 mmol/L (ref 3.5–5.1)
Sodium: 134 mmol/L — ABNORMAL LOW (ref 135–145)

## 2019-02-03 LAB — CBC
HCT: 28.3 % — ABNORMAL LOW (ref 36.0–46.0)
Hemoglobin: 8.9 g/dL — ABNORMAL LOW (ref 12.0–15.0)
MCH: 22.9 pg — ABNORMAL LOW (ref 26.0–34.0)
MCHC: 31.4 g/dL (ref 30.0–36.0)
MCV: 72.8 fL — ABNORMAL LOW (ref 80.0–100.0)
Platelets: 342 10*3/uL (ref 150–400)
RBC: 3.89 MIL/uL (ref 3.87–5.11)
RDW: 18.6 % — ABNORMAL HIGH (ref 11.5–15.5)
WBC: 11.5 10*3/uL — ABNORMAL HIGH (ref 4.0–10.5)
nRBC: 0 % (ref 0.0–0.2)

## 2019-02-03 MED ORDER — LISINOPRIL 40 MG PO TABS: 40.0000 mg | ORAL_TABLET | Freq: Every day | ORAL | 0 refills | Status: DC

## 2019-02-03 MED ORDER — CALCIUM POLYCARBOPHIL 625 MG PO TABS: 625.0000 mg | ORAL_TABLET | Freq: Every day | ORAL | 0 refills | Status: DC

## 2019-02-03 MED ORDER — ONDANSETRON HCL 4 MG PO TABS: 4.0000 mg | ORAL_TABLET | Freq: Four times a day (QID) | ORAL | 0 refills | Status: DC | PRN

## 2019-02-03 MED ORDER — LOPERAMIDE HCL 2 MG PO CAPS: 2.0000 mg | ORAL_CAPSULE | ORAL | 0 refills | Status: DC | PRN

## 2019-02-03 MED ORDER — ISOSORBIDE MONONITRATE ER 30 MG PO TB24: 30.0000 mg | ORAL_TABLET | Freq: Every day | ORAL | 0 refills | Status: DC

## 2019-02-03 MED ORDER — HYDRALAZINE HCL 25 MG PO TABS: 25.0000 mg | ORAL_TABLET | Freq: Three times a day (TID) | ORAL | 0 refills | Status: DC

## 2019-02-03 MED ORDER — ACETAMINOPHEN 325 MG PO TABS: 650.0000 mg | ORAL_TABLET | Freq: Four times a day (QID) | ORAL | Status: AC | PRN

## 2019-02-03 MED ORDER — LOPERAMIDE HCL 2 MG PO CAPS: 2.0000 mg | ORAL_CAPSULE | Freq: Three times a day (TID) | ORAL | 0 refills | Status: DC

## 2019-02-03 MED ORDER — FERROUS SULFATE 325 (65 FE) MG PO TABS: 325.0000 mg | ORAL_TABLET | Freq: Two times a day (BID) | ORAL | 3 refills | Status: DC

## 2019-02-03 MED ORDER — NICOTINE 7 MG/24HR TD PT24: 7.0000 mg | MEDICATED_PATCH | Freq: Every day | TRANSDERMAL | 0 refills | Status: DC

## 2019-02-03 MED ORDER — METOPROLOL TARTRATE 50 MG PO TABS: 50.0000 mg | ORAL_TABLET | Freq: Two times a day (BID) | ORAL | 0 refills | Status: DC

## 2019-02-03 MED ORDER — OXYCODONE HCL 10 MG PO TABS: 5.0000 mg | ORAL_TABLET | ORAL | 0 refills | Status: DC | PRN

## 2019-02-03 NOTE — Discharge Summary (Signed)
Physician Discharge Summary  Casey Black [id number] DOB: 1960-05-17 DOA: 01/23/2019  PCP: Patient, No Pcp Per  Admit date: 01/23/2019 Discharge date: 02/03/2019  Admitted From: home Disposition:  home   Recommendations for Outpatient Follow-up:  1. 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 About 0100, she developed n/v and was unable to stop and so her daughter brought her in. The symptoms started maybe 2 months ago but were intermittent. She has had marked unintentional weight loss in the last few months. She was admitted for a SBO due to a mass obstructing the transverse colon with what appeared to be hepatic metastasis.  Hospital Course:  Principal Problem:   SBO (small bowel obstruction)/colon mass with liver metastasis - 10/21- she underwent transverse colectomy with end colostomy and liver biopsy -colon biopsy reveals: Adenocarcinoma with metastasis to 1 out of 13 lymph nodes -Liver biopsy also reveals metastatic adenocarcinoma - have requested an oncology eval - NG removed 10/25 - PCA stopped on 10/26 - diet slowly advanced - 10/28 wound vac applied to wound - starting 10/28, noted to have a large amount of volume coming out of the colostomy (  2 L) -  - c diff checked by general surgery and negative- she has been started  on Lomotil and fiber - her stool outpt has lessened daily- she had ~ 600 cc of stool today - stool is much more solid- she has increased her oral fluid intake and we will stop IVF today and dc/ her home - oncology evaluated the patient on 10/29- they have offered her chemo and will see her in the outpatient Black  Active Problems:  AKI and hypokalemia -Due to diarrhea - replaced- Mg normal  Leukocytosis - no infectious cause found - steadily improving as diarrhea resolves     Hypertension -Continue hydralazine, isosorbide mononitrate, lisinopril and metoprolol - BP likely elevated more than her baseline due to NS infusion- will not adjust meds yet - she is advised to find a PCP to f/u with - low sodium diet discussed  Anemia- possibly due to acute blood loss - Hb 11-12 early on in the admission- now 8-9 - oral Iron added due to low Iron saturation levels    Ref. Range 02/02/2019 14:59  Iron Latest Ref Range: 28 - 170 ug/dL 7 (L)  UIBC Latest Units: ug/dL 252  TIBC Latest Ref Range: 250 - 450 ug/dL 259  Saturation Ratios Latest Ref Range: 10.4 - 31.8 % 3 (L)  Ferritin Latest Ref Range: 11 - 307 ng/mL 75  Folate Latest Ref Range: >5.9 ng/mL 7.5  Vitamin B12 Latest Ref Range: 180 - 914 pg/mL 1,025 (H)      Tobacco dependence -Weaned nicotine patch down to 7 mg -counseled to stop smoking     Discharge Exam: Vitals:   02/02/19 2024 02/03/19 0444  BP: Marland Kitchen)  194/74 (!) 182/80  Pulse: 77 75  Resp: 18 20  Temp: 98.4 F (36.9 C) 98.6 F (37 C)  SpO2: 100% 100%   Vitals:   02/02/19 0518 02/02/19 1501 02/02/19 2024 02/03/19 0444  BP: (!) 178/76 (!) 163/70 (!) 194/74 (!) 182/80  Pulse: 76 69 77 75  Resp: 20 19 18 20   Temp: 98.4 F (36.9 C) 98.2 F (36.8 C) 98.4 F (36.9 C) 98.6 F (37 C)  TempSrc: Oral Oral Oral Oral  SpO2: 98% 100% 100% 100%  Weight:      Height:        General: Pt is alert, awake, not in acute distress Cardiovascular: RRR, S1/S2 +, no  rubs, no gallops Respiratory: CTA bilaterally, no wheezing, no rhonchi Abdominal: Soft, colostomy with semisolid brown stool wound vac present in large midline vertical abdominal wound with serosanguinous drainage Extremities: no edema, no cyanosis   Discharge Instructions  Discharge Instructions    Diet - low sodium heart healthy   Complete by: As directed    Increase activity slowly   Complete by: As directed      Allergies as of 02/03/2019   No Known Allergies     Medication List    TAKE these medications   acetaminophen 325 MG tablet Commonly known as: TYLENOL Take 2 tablets (650 mg total) by mouth every 6 (six) hours as needed.   ferrous sulfate 325 (65 FE) MG tablet Take 1 tablet (325 mg total) by mouth 2 (two) times daily with a meal.   hydrALAZINE 25 MG tablet Commonly known as: APRESOLINE Take 1 tablet (25 mg total) by mouth every 8 (eight) hours.   isosorbide mononitrate 30 MG 24 hr tablet Commonly known as: IMDUR Take 1 tablet (30 mg total) by mouth daily.   lisinopril 40 MG tablet Commonly known as: ZESTRIL Take 1 tablet (40 mg total) by mouth daily.   loperamide 2 MG capsule Commonly known as: IMODIUM Take 1 capsule (2 mg total) by mouth 3 (three) times daily.   metoprolol tartrate 50 MG tablet Commonly known as: LOPRESSOR Take 1 tablet (50 mg total) by mouth 2 (two) times daily.   nicotine 7 mg/24hr patch Commonly known as: NICODERM CQ - dosed in mg/24 hr Place 1 patch (7 mg total) onto the skin daily.   ondansetron 4 MG tablet Commonly known as: ZOFRAN Take 1 tablet (4 mg total) by mouth every 6 (six) hours as needed for nausea.   polycarbophil 625 MG tablet Commonly known as: FIBERCON Take 1 tablet (625 mg total) by mouth daily.            Durable Medical Equipment  (From admission, onward)         Start     Ordered   01/30/19 1048  For home use only DME Walker rolling  Once    Question:  Patient needs a walker to treat with the  following condition  Answer:  Weakness   01/30/19 1048   01/30/19 1048  For home use only DME 3 n 1  Once     01/30/19 1048         Follow-up [facility] Follow up.   Why: home health RN  Contact information: [address] [place] [address] [phone]        Clovis Riley, MD. Go on 02/21/2019.   Specialty: General Surgery Why: Your appointment is 11/18 at 10:20am with Dr. Kae Heller Please arrive  15 minutes prior to your appointment to check in. Contact information: [address] [place] [address] [phone]          No Known Allergies   Procedures/Studies:    Dg Chest 2 View  Result Date: 01/25/2019 CLINICAL DATA:  Productive cough for several days. History of metastatic cancer. EXAM: CHEST - 2 VIEW COMPARISON:  05/02/2006 chest x-ray FINDINGS: The cardiac silhouette, mediastinal and hilar contours are within normal limits. Streaky areas of subsegmental atelectasis but no definite infiltrates or effusions. No worrisome pulmonary lesions. There is an NG tube coursing down the esophagus and into the stomach. The bony thorax is intact. IMPRESSION: No acute cardiopulmonary findings. NG tube is in the stomach. Electronically Signed   By: Marijo Sanes M.D.   On: 01/25/2019 12:45   Dg Abd 1 View  Result Date: 01/26/2019 CLINICAL DATA:  NG tube placement EXAM: ABDOMEN - 1 VIEW COMPARISON:  January 23, 2019 FINDINGS: The bowel gas pattern is normal. Tip the NG tube is seen projecting over the proximal stomach. IMPRESSION: Tip of NG tube seen projecting over the proximal stomach. Electronically Signed   By: Prudencio Pair M.D.   On: 01/26/2019 00:45   Ct Abdomen Pelvis W Contrast  Result Date: 01/23/2019 CLINICAL DATA:  Acute abdominal pain with generalized small bowel obstruction. EXAM: CT ABDOMEN AND PELVIS WITH CONTRAST TECHNIQUE: Multidetector CT imaging of the abdomen and pelvis was performed using  the standard protocol following bolus administration of intravenous contrast. CONTRAST:  100 cc OMNIPAQUE IOHEXOL 300 MG/ML  SOLN COMPARISON:  None. FINDINGS: Lower chest: Right coronary calcification. Mild right lower lobe scarring along endplate osteophytes. Hepatobiliary: Multiple partially calcified liver lesions consistent with metastatic disease given the constellation of findings, up to 25 mm in the subcapsular right liver.No evidence of biliary obstruction or stone. Pancreas: Unremarkable. Spleen: Unremarkable. Adrenals/Urinary Tract: Negative adrenals. No hydronephrosis or stone. Unremarkable bladder. Stomach/Bowel: Irregular mass at the mid transverse colon with proximal colonic and small bowel dilatation. There is lobulation along the serosal surface. No appendicitis. Vascular/Lymphatic: Mildly enlarged transverse mesocolon lymph node near the mass, 8 mm in diameter. No acute vascular finding Reproductive:Small uterine fibroids. Other: Tumor appears to extend through the serosa but there is no ascites or evident peritoneal nodularity. Musculoskeletal: Degenerative changes without acute or aggressive finding IMPRESSION: Obstructing mid transverse colonic mass with mild regional adenopathy and hepatic metastatic disease. The mass likely extends through the serosa; no ascites or peritoneal nodularity. Electronically Signed   By: Monte Fantasia M.D.   On: 01/23/2019 07:48   Dg Abdomen Acute W/chest  Result Date: 01/23/2019 CLINICAL DATA:  Constipation. Cough. Nausea and vomiting. EXAM: DG ABDOMEN ACUTE W/ 1V CHEST COMPARISON:  None. FINDINGS: Lungs are clear. Normal cardiomediastinal contours. No focal airspace disease. No pleural effusion. Dilated small bowel with air-fluid levels. Minimal stool in the rectosigmoid colon, otherwise no significant formed stool. No evidence of free air. No radiopaque calculi or abnormal soft tissue calcifications. No osseous abnormalities. IMPRESSION: 1. Bowel-gas  pattern consistent with small bowel obstruction. No free air. 2. No acute chest findings. Electronically Signed   By: Keith Rake M.D.   On: 01/23/2019 05:51   Dg Abd Portable 1 View  Result Date: 01/23/2019 CLINICAL DATA:  Nasogastric tube placement. EXAM: PORTABLE ABDOMEN - 1 VIEW COMPARISON:  Same day. FINDINGS: Nasogastric tube tip is seen in proximal stomach. Residual intravenous contrast is seen within the nondilated renal collecting systems. Mildly dilated small bowel loops are  noted concerning for distal small bowel obstruction. No large bowel dilatation is noted. IMPRESSION: Distal tip of nasogastric tube seen in proximal stomach. Mildly dilated small bowel loops are noted concerning for distal small bowel obstruction. Electronically Signed   By: Marijo Conception M.D.   On: 01/23/2019 08:35     The results of significant diagnostics from this hospitalization (including imaging, microbiology, ancillary and laboratory) are listed below for reference.     Microbiology: Recent Results (from the past 240 hour(s))  C difficile quick scan w PCR reflex     Status: None   Collection Time: 01/31/19  2:04 PM   Specimen: STOOL  Result Value Ref Range Status   C Diff antigen NEGATIVE NEGATIVE Final   C Diff toxin NEGATIVE NEGATIVE Final   C Diff interpretation No C. difficile detected.  Final    Comment: Performed at [facility], [place] [address] [place], [place] [address]     Labs: BNP (last 3 results) No results for input(s): BNP in the last 8760 hours. Basic Metabolic Panel: Recent Labs  Lab 01/30/19 0133 01/31/19 0350 02/01/19 0122 02/02/19 0345 02/03/19 0156  NA 141 136 136 136 134*  K 3.6 2.7* 3.4* 4.2 4.2  CL 96* 92* 96* 101 102  CO2 32 33* 27 24 20*  GLUCOSE 96 102* 79 84 103*  BUN 18 30* 31* 21* 14  CREATININE 0.86 1.37* 1.01* 0.82 0.77  CALCIUM 9.3 9.0 8.4* 8.2* 8.4*  MG  --  1.9  --   --   --    Liver Function Tests: Recent Labs  Lab 01/28/19 0323  01/31/19 0350  AST 21 14*  ALT 16 12  ALKPHOS 58 68  BILITOT 0.8 0.4  PROT 6.2* 6.2*  ALBUMIN 2.3* 2.2*   No results for input(s): LIPASE, AMYLASE in the last 168 hours. No results for input(s): AMMONIA in the last 168 hours. CBC: Recent Labs  Lab 01/29/19 0348 01/31/19 0350 02/01/19 0122 02/02/19 0345 02/03/19 0156  WBC 9.7 13.1* 15.1* 12.5* 11.5*  NEUTROABS  --   --   --  9.8*  --   HGB 9.2* 9.5* 9.0* 8.7* 8.9*  HCT 29.2* 29.9* 28.1* 26.9* 28.3*  MCV 73.9* 72.6* 72.1* 71.9* 72.8*  PLT 276 328 325 340 342   Cardiac Enzymes: No results for input(s): CKTOTAL, CKMB, CKMBINDEX, TROPONINI in the last 168 hours. BNP: Invalid input(s): POCBNP CBG: No results for input(s): GLUCAP in the last 168 hours. D-Dimer No results for input(s): DDIMER in the last 72 hours. Hgb A1c No results for input(s): HGBA1C in the last 72 hours. Lipid Profile No results for input(s): CHOL, HDL, LDLCALC, TRIG, CHOLHDL, LDLDIRECT in the last 72 hours. Thyroid function studies No results for input(s): TSH, T4TOTAL, T3FREE, THYROIDAB in the last 72 hours.  Invalid input(s): FREET3 Anemia work up Recent Labs    02/02/19 1459  VITAMINB12 1,025*  FOLATE 7.5  FERRITIN 75  TIBC 259  IRON 7*  RETICCTPCT 0.9   Urinalysis No results found for: COLORURINE, APPEARANCEUR, [place], [place], GLUCOSEU, [place], [place], [place], PROTEINUR, UROBILINOGEN, NITRITE, LEUKOCYTESUR Sepsis Labs Invalid input(s): PROCALCITONIN,  WBC,  LACTICIDVEN Microbiology Recent Results (from the past 240 hour(s))  C difficile quick scan w PCR reflex     Status: None   Collection Time: 01/31/19  2:04 PM   Specimen: STOOL  Result Value Ref Range Status   C Diff antigen NEGATIVE NEGATIVE Final   C Diff toxin NEGATIVE NEGATIVE Final  C Diff interpretation No C. difficile detected.  Final    Comment: Performed at [facility], [place] [address] [place], [place] [address]     Time coordinating discharge in  minutes: 64  SIGNED:   Debbe Odea, MD  [facility] 02/03/2019, 9:02 AM Pager   If 7PM-7AM, please contact night-coverage [email or web address] Password [email or web address]

## 2019-02-03 NOTE — Plan of Care (Signed)
  Problem: Education: Goal: Knowledge of General Education information will improve Description: Including pain rating scale, medication(s)/side effects and non-pharmacologic comfort measures Outcome: Progressing   Problem: Clinical Measurements: Goal: Ability to maintain clinical measurements within normal limits will improve Outcome: Progressing Goal: Will remain free from infection Outcome: Progressing Goal: Diagnostic test results will improve Outcome: Progressing Goal: Respiratory complications will improve Outcome: Progressing Goal: Cardiovascular complication will be avoided Outcome: Progressing   Problem: Activity: Goal: Risk for activity intolerance will decrease Outcome: Progressing   Problem: Nutrition: Goal: Adequate nutrition will be maintained Outcome: Progressing   Problem: Coping: Goal: Level of anxiety will decrease Outcome: Progressing   Problem: Elimination: Goal: Will not experience complications related to bowel motility Outcome: Progressing Goal: Will not experience complications related to urinary retention Outcome: Progressing   Problem: Pain Managment: Goal: General experience of comfort will improve Outcome: Progressing   Problem: Safety: Goal: Ability to remain free from injury will improve Outcome: Progressing   Problem: Skin Integrity: Goal: Risk for impaired skin integrity will decrease Outcome: Progressing   Problem: Education: Goal: Required Educational Video(s) Outcome: Progressing   Problem: Clinical Measurements: Goal: Ability to maintain clinical measurements within normal limits will improve Outcome: Progressing Goal: Postoperative complications will be avoided or minimized Outcome: Progressing   Problem: Skin Integrity: Goal: Demonstration of wound healing without infection will improve Outcome: Progressing

## 2019-02-03 NOTE — Plan of Care (Signed)
D/cto home with daughter VSS pain 4/10 all equipment and materials given to family

## 2019-02-03 NOTE — Progress Notes (Addendum)
Patient ID: Casey Black, female   DOB: Feb 21, 1961, 58 y.o.   MRN: [id number]    10 Days Post-Op  Subjective: Patient looks great.  Ostomy is thickening up a lot.  Tolerating regular diet.  ROS: See above, otherwise other systems negative  Objective: Vital signs in last 24 hours: Temp:  [98.2 F (36.8 C)-98.6 F (37 C)] 98.6 F (37 C) (10/31 0444) Pulse Rate:  [69-77] 75 (10/31 0444) Resp:  [18-20] 20 (10/31 0444) BP: (163-194)/(70-80) 182/80 (10/31 0444) SpO2:  [100 %] 100 % (10/31 0444) Last BM Date: 02/02/19  Intake/Output from previous day: 10/30 0701 - 10/31 0700 In: 3561.7 [P.O.:780; I.V.:2781.7] Out: 1876 [Urine:1201; Drains:100; Stool:575] Intake/Output this shift: No intake/output data recorded.  PE: Abd: soft, colostomy with thicker output today, wound VAC in place, +BS, minimally tender  Lab Results:  Recent Labs    02/02/19 0345 02/03/19 0156  WBC 12.5* 11.5*  HGB 8.7* 8.9*  HCT 26.9* 28.3*  PLT 340 342   BMET Recent Labs    02/02/19 0345 02/03/19 0156  NA 136 134*  K 4.2 4.2  CL 101 102  CO2 24 20*  GLUCOSE 84 103*  BUN 21* 14  CREATININE 0.82 0.77  CALCIUM 8.2* 8.4*   PT/INR No results for input(s): LABPROT, INR in the last 72 hours. CMP     Component Value Date/Time   NA 134 (L) 02/03/2019 0156   K 4.2 02/03/2019 0156   CL 102 02/03/2019 0156   CO2 20 (L) 02/03/2019 0156   GLUCOSE 103 (H) 02/03/2019 0156   BUN 14 02/03/2019 0156   CREATININE 0.77 02/03/2019 0156   CALCIUM 8.4 (L) 02/03/2019 0156   PROT 6.2 (L) 01/31/2019 0350   ALBUMIN 2.2 (L) 01/31/2019 0350   AST 14 (L) 01/31/2019 0350   ALT 12 01/31/2019 0350   ALKPHOS 68 01/31/2019 0350   BILITOT 0.4 01/31/2019 0350   GFRNONAA >60 02/03/2019 0156   GFRAA >60 02/03/2019 0156   Lipase     Component Value Date/Time   LIPASE 17 01/23/2019 0307       Studies/Results: No results found.  Anti-infectives: Anti-infectives (From admission, onward)   Start      Dose/Rate Route Frequency Ordered Stop   01/24/19 1030  cefoTEtan (CEFOTAN) 1 g in sodium chloride 0.9 % 100 mL IVPB     1 g 200 mL/hr over 30 Minutes Intravenous On call to O.R. 01/23/19 1344 01/24/19 1146       Assessment/Plan HTN - new on admission; per medicine Acute blood loss anemia- hgb9.5, stable ARI - resolved  Hypokalemia - resolved  Obstructing transverse colon mass with liver metastases - adenoCA S/P transverse colectomy with end colostomy, liver biopsy, Dr. Kae Heller, 10/21 - POD#10 -appreciate onc eval and patient will be discussed at GI tumor board and will have follow up arranged as outpatient. -soft diet -wound VAC approved by [facility], appreciate WOC assistance with VAC changes -mobilize and IS -therapy recommends HH PT -high colostomy output resolved and stool thickening up.  Have discussed with patient how to wean her imodium as her stool becomes thicker to avoid constipation -agree with DC home today.  Pain meds sent to pharmacy  [id number] VTE: SCD's,sqheparin ID:pre-op only Foley:DC'd 10/22 Follow up:Dr. Kae Heller   LOS: 11 days    Henreitta Cea , [facility] 02/03/2019, 9:32 AM  Agree with above.  Alphonsa Overall, MD, [facility] Office phone:  [phone]

## 2019-02-03 NOTE — TOC Transition Note (Signed)
Transition of Care [facility]) - CM/SW Discharge Note   Patient Details  Name: Casey Black MRN: [id number] Date of Birth: [birth year]  [facility] CM/SW Contact:  Alexander Mt, [place] Phone Number: 02/03/2019, 11:07 AM   Clinical Narrative:    [name] with [place] aware. Start of care for [name] will be Monday 02/05/19. Pt had all needed equipment delivered to room, as well as [employer] vac. Plan is for pt to dc home with her daughter.    Final next level of care: [place] Barriers to Discharge: Barriers Resolved   Patient Goals and CMS Choice Patient states their goals for this hospitalization and ongoing recovery are:: to go home CMS Medicare.gov Compare Post Acute Care list provided to:: Patient Choice offered to / list presented to : Patient  Discharge Placement  Discharge Plan and Services   Discharge Planning Services: CM Consult Post Acute Care Choice: Home Health          DME Arranged: N/A HH Arranged: RN    Social Determinants of Health (SDOH) Interventions     Readmission Risk Interventions No flowsheet data found.

## 2019-02-04 DIAGNOSIS — T8189XA Other complications of procedures, not elsewhere classified, initial encounter: Secondary | ICD-10-CM | POA: Diagnosis not present

## 2019-02-05 DIAGNOSIS — T8189XA Other complications of procedures, not elsewhere classified, initial encounter: Secondary | ICD-10-CM | POA: Diagnosis not present

## 2019-02-05 DIAGNOSIS — Z433 Encounter for attention to colostomy: Secondary | ICD-10-CM | POA: Diagnosis not present

## 2019-02-05 DIAGNOSIS — Z483 Aftercare following surgery for neoplasm: Secondary | ICD-10-CM | POA: Diagnosis not present

## 2019-02-05 DIAGNOSIS — K7689 Other specified diseases of liver: Secondary | ICD-10-CM | POA: Diagnosis not present

## 2019-02-05 DIAGNOSIS — R739 Hyperglycemia, unspecified: Secondary | ICD-10-CM | POA: Diagnosis not present

## 2019-02-05 DIAGNOSIS — C189 Malignant neoplasm of colon, unspecified: Secondary | ICD-10-CM | POA: Diagnosis not present

## 2019-02-05 DIAGNOSIS — I10 Essential (primary) hypertension: Secondary | ICD-10-CM | POA: Diagnosis not present

## 2019-02-06 DIAGNOSIS — T8189XA Other complications of procedures, not elsewhere classified, initial encounter: Secondary | ICD-10-CM | POA: Diagnosis not present

## 2019-02-06 LAB — SURGICAL PATHOLOGY

## 2019-02-07 DIAGNOSIS — T8189XA Other complications of procedures, not elsewhere classified, initial encounter: Secondary | ICD-10-CM | POA: Diagnosis not present

## 2019-02-08 DIAGNOSIS — T8189XA Other complications of procedures, not elsewhere classified, initial encounter: Secondary | ICD-10-CM | POA: Diagnosis not present

## 2019-02-09 DIAGNOSIS — I10 Essential (primary) hypertension: Secondary | ICD-10-CM | POA: Diagnosis not present

## 2019-02-09 DIAGNOSIS — Z483 Aftercare following surgery for neoplasm: Secondary | ICD-10-CM | POA: Diagnosis not present

## 2019-02-09 DIAGNOSIS — R739 Hyperglycemia, unspecified: Secondary | ICD-10-CM | POA: Diagnosis not present

## 2019-02-09 DIAGNOSIS — Z433 Encounter for attention to colostomy: Secondary | ICD-10-CM | POA: Diagnosis not present

## 2019-02-09 DIAGNOSIS — C189 Malignant neoplasm of colon, unspecified: Secondary | ICD-10-CM | POA: Diagnosis not present

## 2019-02-09 DIAGNOSIS — K7689 Other specified diseases of liver: Secondary | ICD-10-CM | POA: Diagnosis not present

## 2019-02-09 DIAGNOSIS — T8189XA Other complications of procedures, not elsewhere classified, initial encounter: Secondary | ICD-10-CM | POA: Diagnosis not present

## 2019-02-10 DIAGNOSIS — K56609 Unspecified intestinal obstruction, unspecified as to partial versus complete obstruction: Secondary | ICD-10-CM | POA: Diagnosis not present

## 2019-02-10 DIAGNOSIS — T8189XA Other complications of procedures, not elsewhere classified, initial encounter: Secondary | ICD-10-CM | POA: Diagnosis not present

## 2019-02-10 DIAGNOSIS — Z933 Colostomy status: Secondary | ICD-10-CM | POA: Diagnosis not present

## 2019-02-11 DIAGNOSIS — T8189XA Other complications of procedures, not elsewhere classified, initial encounter: Secondary | ICD-10-CM | POA: Diagnosis not present

## 2019-02-12 DIAGNOSIS — Z483 Aftercare following surgery for neoplasm: Secondary | ICD-10-CM | POA: Diagnosis not present

## 2019-02-12 DIAGNOSIS — K7689 Other specified diseases of liver: Secondary | ICD-10-CM | POA: Diagnosis not present

## 2019-02-12 DIAGNOSIS — C189 Malignant neoplasm of colon, unspecified: Secondary | ICD-10-CM | POA: Diagnosis not present

## 2019-02-12 DIAGNOSIS — I10 Essential (primary) hypertension: Secondary | ICD-10-CM | POA: Diagnosis not present

## 2019-02-12 DIAGNOSIS — T8189XA Other complications of procedures, not elsewhere classified, initial encounter: Secondary | ICD-10-CM | POA: Diagnosis not present

## 2019-02-12 DIAGNOSIS — Z433 Encounter for attention to colostomy: Secondary | ICD-10-CM | POA: Diagnosis not present

## 2019-02-12 DIAGNOSIS — R739 Hyperglycemia, unspecified: Secondary | ICD-10-CM | POA: Diagnosis not present

## 2019-02-13 DIAGNOSIS — T8189XA Other complications of procedures, not elsewhere classified, initial encounter: Secondary | ICD-10-CM | POA: Diagnosis not present

## 2019-02-13 DIAGNOSIS — Z483 Aftercare following surgery for neoplasm: Secondary | ICD-10-CM | POA: Diagnosis not present

## 2019-02-13 DIAGNOSIS — I10 Essential (primary) hypertension: Secondary | ICD-10-CM | POA: Diagnosis not present

## 2019-02-13 DIAGNOSIS — K7689 Other specified diseases of liver: Secondary | ICD-10-CM | POA: Diagnosis not present

## 2019-02-13 DIAGNOSIS — C189 Malignant neoplasm of colon, unspecified: Secondary | ICD-10-CM | POA: Diagnosis not present

## 2019-02-13 DIAGNOSIS — Z433 Encounter for attention to colostomy: Secondary | ICD-10-CM | POA: Diagnosis not present

## 2019-02-13 DIAGNOSIS — R739 Hyperglycemia, unspecified: Secondary | ICD-10-CM | POA: Diagnosis not present

## 2019-02-14 DIAGNOSIS — K7689 Other specified diseases of liver: Secondary | ICD-10-CM | POA: Diagnosis not present

## 2019-02-14 DIAGNOSIS — I10 Essential (primary) hypertension: Secondary | ICD-10-CM | POA: Diagnosis not present

## 2019-02-14 DIAGNOSIS — T8189XA Other complications of procedures, not elsewhere classified, initial encounter: Secondary | ICD-10-CM | POA: Diagnosis not present

## 2019-02-14 DIAGNOSIS — R739 Hyperglycemia, unspecified: Secondary | ICD-10-CM | POA: Diagnosis not present

## 2019-02-14 DIAGNOSIS — C189 Malignant neoplasm of colon, unspecified: Secondary | ICD-10-CM | POA: Diagnosis not present

## 2019-02-14 DIAGNOSIS — Z433 Encounter for attention to colostomy: Secondary | ICD-10-CM | POA: Diagnosis not present

## 2019-02-14 DIAGNOSIS — Z483 Aftercare following surgery for neoplasm: Secondary | ICD-10-CM | POA: Diagnosis not present

## 2019-02-14 NOTE — Progress Notes (Addendum)
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 Bowel-gas pattern consistent with small bowel obstruction. No free air. 2. No acute chest findings.   01/23/2019 Imaging   CT AP IMPRESSION: Obstructing mid transverse colonic mass with mild regional adenopathy and hepatic metastatic disease. The mass likely extends through the serosa; no ascites or peritoneal nodularity.   01/24/2019 Surgery   Surgeon: Clovis Riley MD Assistant: [name] Procedure performed: Transverse colectomy with end colostomy, liver biopsy Procedure classification: URGENT/EMERGENT Preop diagnosis: Obstructing, metastatic transverse colon mass Post-op diagnosis/intraop findings: Same   01/24/2019 Pathology Results   FINAL MICROSCOPIC DIAGNOSIS:   A. COLON, TRANSVERSE, RESECTION:  Colonic adenocarcinoma, 5 cm.  Carcinoma extends into pericolonic connective tissue and focally to  serosal surface.  Margins not involved.  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   B. LIVER NODULE, LEFT, BIOPSY:  Metastatic adenocarcinoma.    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 Haslip was  initially seen by my colleague Dr. Alen Blew during hospitalization, see inpatient note for full consult.  She was discharged home on 10/31, living at her daughter's house. She feels well today. Her energy/activity level is 75-80% back to normal but appetite is only 25-50% of normal. Bowels move twice per day per coloscomy. Denies n/v. She has wound vac in place, wound nurse comes to her home to change MWF. She will f/u with surgeon on 11/18. She has mild abdominal pain, takes tylenol mostly and oxycodone with dressing changes. She uses a walker at home when needed, denies fall. Denies fever, chills, cough, chest pain, dyspnea, leg swelling, or baseline neuropathy.   Only significant PMH is HTN. Socially, she worked 2 jobs in [employer], was very active on her feet all day. Independent of ADLs. Quit smoking on 01/2019 before hospital admission, smoked 1/2 PPD x29 years. Previously drank alcohol socially but not currently. Denies other drug use. Denies having any cancer screenings in the past. No recent or routine PCP care. Has 2 healthy daughters. No known family history of cancer.    MEDICAL HISTORY:  Past Medical History:  Diagnosis Date  . Hypertension 01/23/2019  . SBO (small bowel obstruction) ([place]) 01/23/2019    SURGICAL HISTORY: Past Surgical History:  Procedure Laterality Date  . CESAREAN SECTION     x2  . COLOSTOMY N/A 01/24/2019   Procedure: End Loop Colostomy;  Surgeon: Clovis Riley, MD;  Location: [place];  Service: General;  Laterality: N/A;  . PARTIAL COLECTOMY N/A 01/24/2019   Procedure: PARTIAL COLECTOMY;  Surgeon: Clovis Riley, MD;  Location: [place];  Service: General;  Laterality: N/A;    I have reviewed the social history and family history with the patient and they are  unchanged from previous note.  ALLERGIES:  has No Known Allergies.  MEDICATIONS:  Current Outpatient Medications  Medication Sig Dispense Refill  . acetaminophen (TYLENOL) 325 MG tablet Take 2  tablets (650 mg total) by mouth every 6 (six) hours as needed.    . ferrous sulfate 325 (65 FE) MG tablet Take 1 tablet (325 mg total) by mouth 2 (two) times daily with a meal. 30 tablet 3  . hydrALAZINE (APRESOLINE) 25 MG tablet Take 1 tablet (25 mg total) by mouth every 8 (eight) hours. 90 tablet 0  . isosorbide mononitrate (IMDUR) 30 MG 24 hr tablet Take 1 tablet (30 mg total) by mouth daily. 30 tablet 0  . lisinopril (ZESTRIL) 40 MG tablet Take 1 tablet (40 mg total) by mouth daily. 30 tablet 0  . loperamide (IMODIUM) 2 MG capsule Take 1 capsule (2 mg total) by mouth as needed for diarrhea or loose stools. 90 capsule 0  . metoprolol tartrate (LOPRESSOR) 50 MG tablet Take 1 tablet (50 mg total) by mouth 2 (two) times daily. 60 tablet 0  . nicotine (NICODERM CQ - DOSED IN MG/24 HR) 7 mg/24hr patch Place 1 patch (7 mg total) onto the skin daily. 7 patch 0  . oxyCODONE 10 MG TABS Take 0.5-1 tablets (5-10 mg total) by mouth every 4 (four) hours as needed for moderate pain or severe pain (5 moderate, 10 severe). 20 tablet 0  . polycarbophil (FIBERCON) 625 MG tablet Take 1 tablet (625 mg total) by mouth daily. 30 tablet 0  . ondansetron (ZOFRAN) 4 MG tablet Take 1 tablet (4 mg total) by mouth every 6 (six) hours as needed for nausea. 30 tablet 0   No current facility-administered medications for this visit.     PHYSICAL EXAMINATION: ECOG PERFORMANCE STATUS: 2 - Symptomatic, <50% confined to bed  Vitals:   02/15/19 1330  BP: (!) 161/65  Pulse: (!) 56  Resp: 17  Temp: 98 F (36.7 C)  SpO2: 100%   Filed Weights   02/15/19 1330  Weight: 190 lb (86.2 kg)    GENERAL:alert, no distress and comfortable SKIN: no rash  EYES:  sclera clear LYMPH:  no palpable cervical or supraclavicular lymphadenopathy LUNGS: clear with normal breathing effort HEART: regular rate & rhythm, no lower extremity edema ABDOMEN:abdomen soft, non-tender and normal bowel sounds, midline wound vac and right colostomy  in place NEURO: alert & oriented x 3 with fluent speech, no focal motor/sensory deficits  LABORATORY DATA:  I have reviewed the data as listed CBC Latest Ref Rng & Units 02/03/2019 02/02/2019 02/01/2019  WBC 4.0 - 10.5 K/uL 11.5(H) 12.5(H) 15.1(H)  Hemoglobin 12.0 - 15.0 g/dL 8.9(L) 8.7(L) 9.0(L)  Hematocrit 36.0 - 46.0 % 28.3(L) 26.9(L) 28.1(L)  Platelets 150 - 400 K/uL 342 340 325     CMP Latest Ref Rng & Units 02/03/2019 02/02/2019 02/01/2019  Glucose 70 - 99 mg/dL 103(H) 84 79  BUN 6 - 20 mg/dL 14 21(H) 31(H)  Creatinine 0.44 - 1.00 mg/dL 0.77 0.82 1.01(H)  Sodium 135 - 145 mmol/L 134(L) 136 136  Potassium 3.5 - 5.1 mmol/L 4.2 4.2 3.4(L)  Chloride 98 - 111 mmol/L 102 101 96(L)  CO2 22 - 32 mmol/L 20(L) 24 27  Calcium 8.9 - 10.3 mg/dL 8.4(L) 8.2(L) 8.4(L)  Total Protein 6.5 - 8.1 g/dL - - -  Total Bilirubin 0.3 - 1.2 mg/dL - - -  Alkaline Phos 38 - 126 U/L - - -  AST 15 - 41 U/L - - -  ALT 0 - 44 U/L - - -      RADIOGRAPHIC STUDIES: I have personally reviewed the radiological images as listed and agreed with the findings in the report. No results found.   ASSESSMENT & PLAN: 58 yo female with no significant past medical history or routine health care presented with 2 mo h/o anorexia and n/v and obstructing colon mass   1. Adenocarcinoma of transverse colon, moderately differentiated, pT4aN1aM1a stage IV with liver metastasis, MMR normal  - We reviewed her medical history in detail including imaging and surgical pathology. She presented with anorexia and n/v, found to have an obstructing mid transverse colon mass with CT concerning for regional adenopathy and liver metastasis. Baseline CEA was elevated to 253.  -She underwent emergent colectomy and liver biopsy on 01/24/19 per surgeon Dr. Romana Juniper.  -Liver metastasis was confirmed in surgery, confirming stage IV disease.  -We reviewed her primary tumor was removed, but she has liver metastasis. She is being referred  for staging PET scan to determine if she has extrahepatic metastasis  -We reviewed systemic treatment options including FOLFOX vs FOLFOXIRI -She is relatively young without significant PMH, she would likely be a good candidate for chemotherapy. However, she has large open abdominal wound with wound vac in place. We discussed intensive chemo and certain biological therapies such as bevacizumab will delay would healing.  -If her PET scan is negative for extrahepatic metastasis, and she responds well to systemic treatment with minimal metastatic disease, she may be a candidate for potentially curative local liver therapy in the future. However, she appears to have at least 5-10 liver lesions, we reviewed the chance of cure is low  -Will request FO testing on her surgical path to see if she is eligible for targeted therapy  -Dr. Burr Medico recommends systemic FOLFOX, or 5FU alone while her wound heals  -Chemotherapy consent: Side effects including but not limited to fatigue, nausea, vomiting, diarrhea, hair thinning/loss, neuropathy, cold sensitivity, vasospasm, hand/foot syndrome, renal and liver dysfunction, neutropenic fever, need for blood transfusion, bleeding, were discussed with patient in great detail. She agrees to proceed. -She will attend chemo class with her daughter next week  -She agrees to [facility] placement, will discuss with her surgeon Dr. Kae Heller, f/u on 11/18  -She will have baseline labs next week when she returns for chemo class and PET scan -We plan to f/u in 2 weeks, hopefully can start chemo in 3-4 weeks. Patient agrees   2. Anorexia -she reportedly weighed 253 lbs a few years ago, and weighed 203 lbs at symptom onset ~11/2018 -190 lbs today (02/15/19) -her appetite is improving -I have referred her to dietician   3. Abdominal wound -She has a large open midline incision, closed with wound vac  -Wound care comes to her home MWF for dressing changes -F/u with Dr. Kae Heller 11/18   4. Iron  deficiency anemia  -Ferritin 10 and transferrin saturation 7% on 01/24/19, IV Feraheme was recommended by oncology during hospitalization; ferritin up to 75 on 02/02/19 -takes oral iron BID -Hgb 8.9 at discharge (02/03/19)   5. HTN -on hydralazine, isosorbide, lisinopril, metoprolol  -BP 116/65 today   6. Social support -She worked in [employer] at 2 different jobs, but has been out of work lately due to her symptoms  -She is not getting disability, no pay, but her employer kept her on their insurance plan -She may be eligible for Medicaid -She agrees to referral to SW to discuss   7. Goals  of care discussion  -We discussed stage IV cancer is usually not curable with chemo alone. Her primary tumor was removed. If she tolerates intensive chemo well with good response, she may be a candidate for curative therapy to her liver metastasis if PET shows no extrahepatic metastasis and she responds well in the future.  -She understands this is not guaranteed    PLAN: -Medical record reviewed -Continue wound healing and surgery recovery -Lab, chemo class, PET scan in 1-2 weeks -Referrals to SW and dietician  -F/u with Surgery on 11/18, will discuss PAC placement with Dr. Kae Heller  -F/u in 2-3 weeks -Plan to start chemo in the next month or so, low intensity while abdominal wound heals    Orders Placed This Encounter  Procedures  . NM PET Image Initial (PI) Skull Base To Thigh    Standing Status:   Future    Standing Expiration Date:   02/15/2020    Order Specific Question:   If indicated for the ordered procedure, I authorize the administration of a radiopharmaceutical per Radiology protocol    Answer:   Yes    Order Specific Question:   Is the patient pregnant?    Answer:   No    Order Specific Question:   Preferred imaging location?    Answer:   [facility]    Order Specific Question:   Radiology Contrast Protocol - do NOT remove file path    Answer:    [email or web address]  . Iron and TIBC    Standing Status:   Standing    Number of Occurrences:   20    Standing Expiration Date:   02/15/2020  . Ferritin    Standing Status:   Standing    Number of Occurrences:   20    Standing Expiration Date:   02/15/2020  . CMP ([place] only)    Standing Status:   Standing    Number of Occurrences:   20    Standing Expiration Date:   02/15/2020  . CBC with Differential (Cancer Center Only)    Standing Status:   Standing    Number of Occurrences:   20    Standing Expiration Date:   02/15/2020  . CEA (IN HOUSE-[facility])    Standing Status:   Standing    Number of Occurrences:   20    Standing Expiration Date:   02/15/2020  . Ambulatory referral to Nutrition and Diabetic E    Referral Priority:   Routine    Referral Type:   Consultation    Referral Reason:   Specialty Services Required    Number of Visits Requested:   1  . Ambulatory referral to Social Work    Referral Priority:   Routine    Referral Type:   Consultation    Referral Reason:   Specialty Services Required    Number of Visits Requested:   1   All questions were answered. The patient knows to call the clinic with any problems, questions or concerns. No barriers to learning was detected.     [name], NP 02/15/19   Addendum  I have seen the patient, examined her. I agree with the assessment and and plan and have edited the notes.   I personally reviewed her CT scan images with patient and her daughter, and discussed her surgical pathology results in detail.  Unfortunately she has biopsy confirmed metastatic colon cancer to liver.  Due to the several diffuse liver metastasis, liver targeted therapy  will be challenge.  We discussed that her metastatic colon cancer is likely not curable, I recommend palliative chemotherapy to prolong her life.  We discussed chemo regimen options, due to her open wound (on wound vacuum), I recommend her to start with  FOLFOX first, and will add Avastin later  after wound healed. Will get a PET scan to complete staging, and send her liver biopsy for foundation one genomic testing. We also addressed her financial concerns, and will refer her to SW to see if she wants to apply for disability and Medicaid.  All questions were answered, plan to see her back in 2-3 weeks for re-evaluation and schedule her first chemo.    Truitt Merle  02/15/2019

## 2019-02-15 ENCOUNTER — Other Ambulatory Visit: Payer: Self-pay | Admitting: Hematology

## 2019-02-15 ENCOUNTER — Inpatient Hospital Stay: Payer: BC Managed Care – PPO | Attending: Nurse Practitioner | Admitting: Nurse Practitioner

## 2019-02-15 ENCOUNTER — Encounter: Payer: Self-pay | Admitting: General Practice

## 2019-02-15 ENCOUNTER — Other Ambulatory Visit: Payer: Self-pay

## 2019-02-15 ENCOUNTER — Encounter: Payer: Self-pay | Admitting: Nurse Practitioner

## 2019-02-15 VITALS — BP 161/65 | HR 56 | Temp 98.0°F | Resp 17 | Ht 67.0 in | Wt 190.0 lb

## 2019-02-15 DIAGNOSIS — Z7189 Other specified counseling: Secondary | ICD-10-CM | POA: Insufficient documentation

## 2019-02-15 DIAGNOSIS — C787 Secondary malignant neoplasm of liver and intrahepatic bile duct: Secondary | ICD-10-CM

## 2019-02-15 DIAGNOSIS — Z87891 Personal history of nicotine dependence: Secondary | ICD-10-CM | POA: Diagnosis not present

## 2019-02-15 DIAGNOSIS — R630 Anorexia: Secondary | ICD-10-CM | POA: Diagnosis not present

## 2019-02-15 DIAGNOSIS — D509 Iron deficiency anemia, unspecified: Secondary | ICD-10-CM | POA: Diagnosis not present

## 2019-02-15 DIAGNOSIS — T8189XA Other complications of procedures, not elsewhere classified, initial encounter: Secondary | ICD-10-CM | POA: Diagnosis not present

## 2019-02-15 DIAGNOSIS — C189 Malignant neoplasm of colon, unspecified: Secondary | ICD-10-CM

## 2019-02-15 DIAGNOSIS — I10 Essential (primary) hypertension: Secondary | ICD-10-CM | POA: Diagnosis not present

## 2019-02-15 DIAGNOSIS — D490 Neoplasm of unspecified behavior of digestive system: Secondary | ICD-10-CM

## 2019-02-15 DIAGNOSIS — C184 Malignant neoplasm of transverse colon: Secondary | ICD-10-CM | POA: Insufficient documentation

## 2019-02-15 MED ORDER — LIDOCAINE-PRILOCAINE 2.5-2.5 % EX CREA: TOPICAL_CREAM | CUTANEOUS | 3 refills | Status: DC

## 2019-02-15 MED ORDER — PROCHLORPERAZINE MALEATE 10 MG PO TABS: 10.0000 mg | ORAL_TABLET | Freq: Four times a day (QID) | ORAL | 1 refills | Status: DC | PRN

## 2019-02-15 MED ORDER — ONDANSETRON HCL 8 MG PO TABS: 8.0000 mg | ORAL_TABLET | Freq: Two times a day (BID) | ORAL | 1 refills | Status: DC | PRN

## 2019-02-15 NOTE — Progress Notes (Signed)
[place] CSW Progress Notes  Called patient at request of NP Cira Rue - patient has questions about Medicaid and disability.  Sent link to online Medicaid application as this is handled through Dept of Orthoptist.  Also referred to [facility] for help w filing for Social Security disability.  Edwyna Shell, LCSW Clinical Social Worker Phone:  [phone]

## 2019-02-16 ENCOUNTER — Telehealth: Payer: Self-pay | Admitting: Nurse Practitioner

## 2019-02-16 ENCOUNTER — Encounter: Payer: Self-pay | Admitting: Hematology

## 2019-02-16 DIAGNOSIS — I10 Essential (primary) hypertension: Secondary | ICD-10-CM | POA: Diagnosis not present

## 2019-02-16 DIAGNOSIS — K7689 Other specified diseases of liver: Secondary | ICD-10-CM | POA: Diagnosis not present

## 2019-02-16 DIAGNOSIS — C189 Malignant neoplasm of colon, unspecified: Secondary | ICD-10-CM | POA: Diagnosis not present

## 2019-02-16 DIAGNOSIS — Z433 Encounter for attention to colostomy: Secondary | ICD-10-CM | POA: Diagnosis not present

## 2019-02-16 DIAGNOSIS — T8189XA Other complications of procedures, not elsewhere classified, initial encounter: Secondary | ICD-10-CM | POA: Diagnosis not present

## 2019-02-16 DIAGNOSIS — Z483 Aftercare following surgery for neoplasm: Secondary | ICD-10-CM | POA: Diagnosis not present

## 2019-02-16 DIAGNOSIS — R739 Hyperglycemia, unspecified: Secondary | ICD-10-CM | POA: Diagnosis not present

## 2019-02-16 NOTE — Progress Notes (Signed)
Followed up to request for [employer], was informed by Pathology Edmonia Lynch) that material was already sent out on 11/6, but no report has been received yet.

## 2019-02-16 NOTE — Progress Notes (Signed)
[employer] request sent to pathology.

## 2019-02-16 NOTE — Telephone Encounter (Signed)
Scheduled appt per 11/12 los.  Spoke with pt, and she is aware of her scheduled appt dates and time.

## 2019-02-17 DIAGNOSIS — T8189XA Other complications of procedures, not elsewhere classified, initial encounter: Secondary | ICD-10-CM | POA: Diagnosis not present

## 2019-02-18 DIAGNOSIS — T8189XA Other complications of procedures, not elsewhere classified, initial encounter: Secondary | ICD-10-CM | POA: Diagnosis not present

## 2019-02-18 DIAGNOSIS — C787 Secondary malignant neoplasm of liver and intrahepatic bile duct: Secondary | ICD-10-CM | POA: Diagnosis not present

## 2019-02-18 DIAGNOSIS — C189 Malignant neoplasm of colon, unspecified: Secondary | ICD-10-CM | POA: Diagnosis not present

## 2019-02-19 ENCOUNTER — Encounter (HOSPITAL_COMMUNITY): Payer: Self-pay | Admitting: Hematology

## 2019-02-19 DIAGNOSIS — C189 Malignant neoplasm of colon, unspecified: Secondary | ICD-10-CM | POA: Diagnosis not present

## 2019-02-19 DIAGNOSIS — Z933 Colostomy status: Secondary | ICD-10-CM | POA: Diagnosis not present

## 2019-02-19 DIAGNOSIS — Z433 Encounter for attention to colostomy: Secondary | ICD-10-CM | POA: Diagnosis not present

## 2019-02-19 DIAGNOSIS — T8189XA Other complications of procedures, not elsewhere classified, initial encounter: Secondary | ICD-10-CM | POA: Diagnosis not present

## 2019-02-19 DIAGNOSIS — I10 Essential (primary) hypertension: Secondary | ICD-10-CM | POA: Diagnosis not present

## 2019-02-19 DIAGNOSIS — Z483 Aftercare following surgery for neoplasm: Secondary | ICD-10-CM | POA: Diagnosis not present

## 2019-02-19 DIAGNOSIS — R739 Hyperglycemia, unspecified: Secondary | ICD-10-CM | POA: Diagnosis not present

## 2019-02-19 DIAGNOSIS — K7689 Other specified diseases of liver: Secondary | ICD-10-CM | POA: Diagnosis not present

## 2019-02-19 DIAGNOSIS — K56609 Unspecified intestinal obstruction, unspecified as to partial versus complete obstruction: Secondary | ICD-10-CM | POA: Diagnosis not present

## 2019-02-20 DIAGNOSIS — T8189XA Other complications of procedures, not elsewhere classified, initial encounter: Secondary | ICD-10-CM | POA: Diagnosis not present

## 2019-02-21 ENCOUNTER — Ambulatory Visit: Payer: Self-pay | Admitting: Surgery

## 2019-02-21 DIAGNOSIS — T8189XA Other complications of procedures, not elsewhere classified, initial encounter: Secondary | ICD-10-CM | POA: Diagnosis not present

## 2019-02-22 DIAGNOSIS — T8189XA Other complications of procedures, not elsewhere classified, initial encounter: Secondary | ICD-10-CM | POA: Diagnosis not present

## 2019-02-22 NOTE — Progress Notes (Signed)
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 Bowel-gas pattern consistent with small bowel obstruction. No free air. 2. No acute chest findings.   01/23/2019 Imaging   CT AP IMPRESSION: Obstructing mid transverse colonic mass with mild regional adenopathy and hepatic metastatic disease. The mass likely extends through the serosa; no ascites or peritoneal nodularity.   01/24/2019 Surgery   Surgeon: Clovis Riley MD Assistant: [name] Procedure performed: Transverse colectomy with end colostomy, liver biopsy Procedure classification: URGENT/EMERGENT Preop diagnosis: Obstructing, metastatic transverse colon mass Post-op diagnosis/intraop findings: Same   01/24/2019 Pathology Results   FINAL MICROSCOPIC DIAGNOSIS:   A. COLON, TRANSVERSE, RESECTION:  Colonic adenocarcinoma, 5 cm.  Carcinoma extends into pericolonic connective tissue and focally to  serosal surface.  Margins not involved.  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   B. LIVER NODULE, LEFT, BIOPSY:  Metastatic adenocarcinoma.    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 Hypermetabolic metastatic disease in the liver and mediastinal/hilar/axillary lymph nodes. 2. Focal hypermetabolism in the rectum. Continued attention on follow-up exams is warranted. 3. Focal hypermetabolism medial to the right adrenal gland may be within a metastatic lymph node, better visualized on 01/23/2019. 4. Aortic atherosclerosis (ICD10-170.0). Coronary artery calcification.   03/12/2019 -  Chemotherapy   FOLFOX q2weeks starting 03/12/19      CURRENT THERAPY:  PENDING FOLFOX q2weeks starting 03/12/19  INTERVAL HISTORY:  Casey Black is here for a follow up. She notes she is doing well. Her wound surgical wound is still healing with pump in place. She plans to be seen by them again next week. She feels she has recovered well from surgery. She is active and walks and eats adequately. She notes she takes Oxycodone only with wound care due to pain. She notes she just set appointment to see new PCP in 2 weeks.     REVIEW OF SYSTEMS:   Constitutional: Denies fevers, chills or abnormal weight loss Eyes: Denies blurriness of vision Ears, nose, mouth, throat, and face: Denies mucositis or sore throat Respiratory: Denies cough, dyspnea or wheezes Cardiovascular: Denies palpitation, chest discomfort or lower extremity swelling Gastrointestinal:  Denies nausea, heartburn or change in bowel habits (+) Mid abdominal wound pain  Skin: Denies abnormal skin rashes Lymphatics: Denies new lymphadenopathy or easy bruising Neurological:Denies numbness, tingling or new weaknesses Behavioral/Psych: Mood is stable, no new changes  All other systems were reviewed with the patient and are negative.  MEDICAL HISTORY:  Past Medical History:  Diagnosis Date    Anemia    low iron   Cancer ([place])    colon cancer   Hypertension 01/23/2019   SBO (small bowel obstruction) ([place]) 01/23/2019    SURGICAL HISTORY: Past Surgical History:  Procedure Laterality Date   CESAREAN SECTION     x2   COLOSTOMY N/A 01/24/2019   Procedure: End Loop Colostomy;  Surgeon: Clovis Riley, MD;  Location: [facility];  Service: General;  Laterality: N/A;   PARTIAL COLECTOMY N/A 01/24/2019   Procedure: PARTIAL COLECTOMY;  Surgeon: Clovis Riley, MD;  Location: [facility];  Service: General;  Laterality: N/A;    I have reviewed the social history and family history with the patient and they are unchanged from previous note.  ALLERGIES:  has No Known Allergies.  MEDICATIONS:  Current Outpatient Medications  Medication Sig Dispense Refill   acetaminophen (TYLENOL) 325 MG tablet Take 2 tablets (650 mg total) by mouth every 6 (six) hours as needed.     ferrous sulfate 325 (65 FE) MG tablet Take 1 tablet (325 mg total) by mouth 2 (two) times daily with a meal. 30 tablet 3   hydrALAZINE (APRESOLINE) 25 MG tablet Take 1 tablet (25 mg total) by mouth every 8 (eight) hours. (Patient taking differently: Take 25 mg by mouth 3 (three) times daily. ) 90 tablet 0   Ibuprofen (ADVIL) 200 MG CAPS Take 400 mg by mouth daily as needed (pain).     isosorbide mononitrate (IMDUR) 30 MG 24 hr tablet Take 1 tablet (30 mg total) by mouth daily. 30 tablet 0   lidocaine-prilocaine (EMLA) cream Apply to affected area once (Patient taking differently: Apply 1 application topically once. Apply to affected area once) 30 g 3   lisinopril (ZESTRIL) 40 MG tablet Take 1 tablet (40 mg total) by mouth daily. 30 tablet 0   loperamide (IMODIUM) 2 MG capsule Take 1 capsule (2 mg total) by mouth as needed for diarrhea or loose stools. (Patient taking differently: Take 2 mg by mouth 3 (three) times daily. ) 90 capsule 0   metoprolol tartrate (LOPRESSOR) 50 MG tablet Take 1 tablet (50 mg total) by  mouth 2 (two) times daily. 60 tablet 0   nicotine (NICODERM CQ - DOSED IN MG/24 HR) 7 mg/24hr patch Place 1 patch (7 mg total) onto the skin daily. 7 patch 0   ondansetron (ZOFRAN) 4 MG tablet Take 1 tablet (4 mg total) by mouth every 6 (six) hours as needed for nausea. 30 tablet 0   ondansetron (ZOFRAN) 8 MG tablet Take 1 tablet (8 mg total) by mouth 2 (two) times daily as needed for refractory nausea / vomiting. Start on day 3 after chemotherapy. 30 tablet 1   oxyCODONE 10 MG TABS Take 0.5-1 tablets (5-10 mg total) by mouth every 4 (four) hours as needed for moderate pain or severe pain (5 moderate, 10 severe). 20 tablet 0   polycarbophil (FIBERCON) 625 MG tablet Take 1 tablet (625 mg total) by mouth daily. 30 tablet 0   prochlorperazine (COMPAZINE) 10 MG tablet Take 1 tablet (10 mg total) by mouth every 6 (six) hours as needed (Nausea or vomiting). 30 tablet 1   No current facility-administered medications for this visit.     PHYSICAL EXAMINATION: ECOG PERFORMANCE STATUS: 1 - Symptomatic but completely ambulatory  Vitals:   02/27/19 1425 02/27/19 1427  BP: (!) 189/65 (!) 160/66  Pulse: 66   Resp: 20   Temp: 98.2 F (36.8  C)   SpO2: 100%    Filed Weights   02/27/19 1425  Weight: 186 lb 14.4 oz (84.8 kg)    GENERAL:alert, no distress and comfortable SKIN: skin color, texture, turgor are normal, no rashes or significant lesions EYES: normal, Conjunctiva are pink and non-injected, sclera clear  NECK: supple, thyroid normal size, non-tender, without nodularity LYMPH:  no palpable lymphadenopathy in the cervical, axillary  LUNGS: clear to auscultation and percussion with normal breathing effort HEART: regular rate & rhythm and no murmurs and no lower extremity edema ABDOMEN:abdomen soft, non-tender and normal bowel sounds (+) midline surgical incision still healing with wound vacuum in place.  Musculoskeletal:no cyanosis of digits and no clubbing  NEURO: alert & oriented x 3  with fluent speech, no focal motor/sensory deficits  LABORATORY DATA:  I have reviewed the data as listed CBC Latest Ref Rng & Units 02/23/2019 02/03/2019 02/02/2019  WBC 4.0 - 10.5 K/uL 6.9 11.5(H) 12.5(H)  Hemoglobin 12.0 - 15.0 g/dL 8.9(L) 8.9(L) 8.7(L)  Hematocrit 36.0 - 46.0 % 28.8(L) 28.3(L) 26.9(L)  Platelets 150 - 400 K/uL 214 342 340     CMP Latest Ref Rng & Units 02/23/2019 02/03/2019 02/02/2019  Glucose 70 - 99 mg/dL 94 103(H) 84  BUN 6 - 20 mg/dL 7 14 21(H)  Creatinine 0.44 - 1.00 mg/dL 0.88 0.77 0.82  Sodium 135 - 145 mmol/L 137 134(L) 136  Potassium 3.5 - 5.1 mmol/L 4.7 4.2 4.2  Chloride 98 - 111 mmol/L 102 102 101  CO2 22 - 32 mmol/L 27 20(L) 24  Calcium 8.9 - 10.3 mg/dL 9.4 8.4(L) 8.2(L)  Total Protein 6.5 - 8.1 g/dL 7.1 - -  Total Bilirubin 0.3 - 1.2 mg/dL 0.4 - -  Alkaline Phos 38 - 126 U/L 92 - -  AST 15 - 41 U/L 14(L) - -  ALT 0 - 44 U/L 11 - -      RADIOGRAPHIC STUDIES: I have personally reviewed the radiological images as listed and agreed with the findings in the report. Nm Pet Image Initial (pi) Skull Base To Thigh  Result Date: 02/26/2019 CLINICAL DATA:  Initial treatment strategy for colorectal cancer. EXAM: NUCLEAR MEDICINE PET SKULL BASE TO THIGH TECHNIQUE: 9.7 mCi F-18 FDG was injected intravenously. Full-ring PET imaging was performed from the skull base to thigh after the radiotracer. CT data was obtained and used for attenuation correction and anatomic localization. Fasting blood glucose: 90 mg/dl COMPARISON:  CT abdomen pelvis 01/23/2019. FINDINGS: Mediastinal blood pool activity: SUV max 3.0 Liver activity: SUV max NA NECK: No abnormal hypermetabolism. Incidental CT findings: None. CHEST: There are hypermetabolic lymph nodes in the mediastinum, both hilar regions and both axillary regions. Index low right paratracheal lymph node measures 7 mm (4/55) with an SUV max of 4.8. Index left hilar hypermetabolism, SUV max 7.3. Index right axillary lymph  node measures 8 mm (4/62) with an SUV max 6.4. Left periaortic lymph node measures 6 mm (4/75) has an SUV max of 7.1. No hypermetabolic pulmonary nodules. Incidental CT findings: Atherosclerotic calcification of the aorta and coronary arteries. Heart is enlarged. No pericardial effusion. ABDOMEN/PELVIS: Focal hypermetabolism in the periphery of the right hepatic lobe has an SUV max 12.8 and corresponds to heterogeneous lesions in the same location on 01/23/2019. A second index hyper metabolic lesion along the dome of the liver corresponds to minimal calcification on CT (4/86), with an SUV max 10.8, and may be within the left hepatic lobe. Additional areas of milder focal hypermetabolism are seen in  the right hepatic lobe. No definite abnormal hypermetabolism in the adrenal glands, spleen or pancreas. Focal hypermetabolism medial to the right adrenal gland has an SUV max of 3.7 and likely corresponds to a hyperdense lymph node in the same location on 01/23/2019, not well seen on the current study. Hypermetabolism is seen along the ventral abdominal midline, associated with postoperative changes. Intense hypermetabolism is associated with the rectum, SUV max 14.1. Incidental CT findings: Liver, gallbladder, adrenal glands, kidneys, spleen, pancreas, stomach and small bowel are otherwise unremarkable. Right lower quadrant colostomy. SKELETON: No abnormal osseous hypermetabolism. Incidental CT findings: None. IMPRESSION: 1. Hypermetabolic metastatic disease in the liver and mediastinal/hilar/axillary lymph nodes. 2. Focal hypermetabolism in the rectum. Continued attention on follow-up exams is warranted. 3. Focal hypermetabolism medial to the right adrenal gland may be within a metastatic lymph node, better visualized on 01/23/2019. 4. Aortic atherosclerosis (ICD10-170.0). Coronary artery calcification. Electronically Signed   By: Lorin Picket M.D.   On: 02/26/2019 14:55     ASSESSMENT & PLAN:  Casey Black is  a 58 y.o. female with   1. 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  -I personally reviewed her PET from 02/26/19 and discussed the findings which shows known liver metastasis and metastatic lymphadenopathy in chest and right axilla.  -We discussed her cancer is stage IV and no longer eligible for surgery. The likelihood of curing this is very low due to her diffuse metastasis, but still very treatable. We discussed treatment with first-line chemotherapy FOLFOX and avastin q2weeks. Will hold Avastin for first few cycles due to her wound issue.  -Goal of care is palliative to control her disease and prolong her life. She has given chemo consent. Plan to start in 2 weeks.  -FO result pending  -Will have PAC placed on 02/28/19.  -Physical exam shows her wound is still healing with wound vacuum in place. Labs reviewed from last week, hg 8.9, mild iron deficiency, I encourage her to take oral iron, will monitor on chemo.  -She understands and is willing to proceed with treatment for as long as she can.  -F/u in 2 weeks with NP Lacie and start of treatment.    2. Anorexia  -she reportedly weighed 253 lbs a few years ago, and weighed 203 lbs at symptom onset ~11/2018 -190 lbs in 02/2019.  -Continue to f/u with dietician  -AS she recovers from surgery she is eating more and being more active. I encouraged her to continue.   3. Abdominal wound -She has a large open midline incision, closed with wound vac.  -Wound care comes to her home MWF for dressing changes, will continue and f/u with Dr Linus Salmons.  -She has pain with wound care. She takes Oxycodone when this occurs.   4. 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  -will give IV iron if needed   5. HTN -on hydralazine, isosorbide, lisinopril, metoprolol. Continue medications and f/u with PCP.   6. Social support -She worked in [employer] at 2 different jobs, but has been out of work lately due to her symptoms  -She is not getting disability, no pay, but her employer kept her on their insurance plan -She may be eligible for Medicaid -  She agrees to referral to SW to discuss   7. Goals of care discussion  -The patient understands the goal of care is palliative. -I recommend DNR/DNI, she will think about it   PLAN: -PET scan reviewed -PAC placement tomorrow  -Lab, flush, f/u and chemo FOLFOX on 12/7 and in 3 weeks, will add Avastin after her wound heals completely    No problem-specific Assessment & Plan notes found for this encounter.   No orders of the defined types were placed in this encounter.  All questions were answered. The patient knows to call the clinic with any problems, questions or concerns. No barriers to learning was detected. I spent 20 minutes counseling the patient face to face. The total time spent in the appointment was 25 minutes and more than 50% was on counseling and review of test results     Truitt Merle, MD 02/27/2019   I, Joslyn Devon, am acting as scribe for Truitt Merle, MD.   I have reviewed the above documentation for accuracy and completeness, and I agree with the above.

## 2019-02-23 ENCOUNTER — Other Ambulatory Visit: Payer: Self-pay

## 2019-02-23 ENCOUNTER — Inpatient Hospital Stay: Payer: BC Managed Care – PPO

## 2019-02-23 ENCOUNTER — Other Ambulatory Visit: Payer: Self-pay | Admitting: Hematology

## 2019-02-23 ENCOUNTER — Encounter: Payer: Self-pay | Admitting: Hematology

## 2019-02-23 DIAGNOSIS — C184 Malignant neoplasm of transverse colon: Secondary | ICD-10-CM | POA: Diagnosis not present

## 2019-02-23 DIAGNOSIS — R630 Anorexia: Secondary | ICD-10-CM | POA: Diagnosis not present

## 2019-02-23 DIAGNOSIS — K7689 Other specified diseases of liver: Secondary | ICD-10-CM | POA: Diagnosis not present

## 2019-02-23 DIAGNOSIS — R739 Hyperglycemia, unspecified: Secondary | ICD-10-CM | POA: Diagnosis not present

## 2019-02-23 DIAGNOSIS — T8189XA Other complications of procedures, not elsewhere classified, initial encounter: Secondary | ICD-10-CM | POA: Diagnosis not present

## 2019-02-23 DIAGNOSIS — Z433 Encounter for attention to colostomy: Secondary | ICD-10-CM | POA: Diagnosis not present

## 2019-02-23 DIAGNOSIS — C787 Secondary malignant neoplasm of liver and intrahepatic bile duct: Secondary | ICD-10-CM | POA: Diagnosis not present

## 2019-02-23 DIAGNOSIS — Z87891 Personal history of nicotine dependence: Secondary | ICD-10-CM | POA: Diagnosis not present

## 2019-02-23 DIAGNOSIS — D509 Iron deficiency anemia, unspecified: Secondary | ICD-10-CM | POA: Diagnosis not present

## 2019-02-23 DIAGNOSIS — I10 Essential (primary) hypertension: Secondary | ICD-10-CM | POA: Diagnosis not present

## 2019-02-23 DIAGNOSIS — C189 Malignant neoplasm of colon, unspecified: Secondary | ICD-10-CM | POA: Diagnosis not present

## 2019-02-23 DIAGNOSIS — Z483 Aftercare following surgery for neoplasm: Secondary | ICD-10-CM | POA: Diagnosis not present

## 2019-02-23 LAB — CBC WITH DIFFERENTIAL (CANCER CENTER ONLY)
Abs Immature Granulocytes: 0.02 10*3/uL (ref 0.00–0.07)
Basophils Absolute: 0 10*3/uL (ref 0.0–0.1)
Basophils Relative: 1 %
Eosinophils Absolute: 0.2 10*3/uL (ref 0.0–0.5)
Eosinophils Relative: 3 %
HCT: 28.8 % — ABNORMAL LOW (ref 36.0–46.0)
Hemoglobin: 8.9 g/dL — ABNORMAL LOW (ref 12.0–15.0)
Immature Granulocytes: 0 %
Lymphocytes Relative: 24 %
Lymphs Abs: 1.7 10*3/uL (ref 0.7–4.0)
MCH: 24.1 pg — ABNORMAL LOW (ref 26.0–34.0)
MCHC: 30.9 g/dL (ref 30.0–36.0)
MCV: 77.8 fL — ABNORMAL LOW (ref 80.0–100.0)
Monocytes Absolute: 0.7 10*3/uL (ref 0.1–1.0)
Monocytes Relative: 9 %
Neutro Abs: 4.4 10*3/uL (ref 1.7–7.7)
Neutrophils Relative %: 63 %
Platelet Count: 214 10*3/uL (ref 150–400)
RBC: 3.7 MIL/uL — ABNORMAL LOW (ref 3.87–5.11)
RDW: 21.9 % — ABNORMAL HIGH (ref 11.5–15.5)
WBC Count: 6.9 10*3/uL (ref 4.0–10.5)
nRBC: 0 % (ref 0.0–0.2)

## 2019-02-23 LAB — CMP (CANCER CENTER ONLY)
ALT: 11 U/L (ref 0–44)
AST: 14 U/L — ABNORMAL LOW (ref 15–41)
Albumin: 2.9 g/dL — ABNORMAL LOW (ref 3.5–5.0)
Alkaline Phosphatase: 92 U/L (ref 38–126)
Anion gap: 8 (ref 5–15)
BUN: 7 mg/dL (ref 6–20)
CO2: 27 mmol/L (ref 22–32)
Calcium: 9.4 mg/dL (ref 8.9–10.3)
Chloride: 102 mmol/L (ref 98–111)
Creatinine: 0.88 mg/dL (ref 0.44–1.00)
GFR, Est AFR Am: >60 mL/min (ref >60)
GFR, Estimated: >60 mL/min (ref >60)
Glucose, Bld: 94 mg/dL (ref 70–99)
Potassium: 4.7 mmol/L (ref 3.5–5.1)
Sodium: 137 mmol/L (ref 135–145)
Total Bilirubin: 0.4 mg/dL (ref 0.3–1.2)
Total Protein: 7.1 g/dL (ref 6.5–8.1)

## 2019-02-23 LAB — FERRITIN: Ferritin: 28 ng/mL (ref 11–307)

## 2019-02-23 LAB — IRON AND TIBC
Iron: 30 ug/dL — ABNORMAL LOW (ref 41–142)
Saturation Ratios: 11 % — ABNORMAL LOW (ref 21–57)
TIBC: 273 ug/dL (ref 236–444)
UIBC: 242 ug/dL (ref 120–384)

## 2019-02-23 LAB — CEA (IN HOUSE-CHCC): CEA (CHCC-In House): 43.97 ng/mL — ABNORMAL HIGH (ref 0.00–5.00)

## 2019-02-23 NOTE — Progress Notes (Signed)
Met w/ pt to introduce myself as her Arboriculturist.  Unfortunately there aren't any foundations offering copay assistance for her Dx.  I offered the [employer], went over what it covers and gave her the income requirement.  Pt stated she's not working right now so she will provide a letter of support on 02/26/19.  Once received I will approve her for the grant and give her an expense sheet.

## 2019-02-24 ENCOUNTER — Other Ambulatory Visit (HOSPITAL_COMMUNITY)
Admission: RE | Admit: 2019-02-24 | Discharge: 2019-02-24 | Disposition: A | Discharge status: Home / Self Care | Payer: BC Managed Care – PPO | Source: Ambulatory Visit | Attending: Surgery | Admitting: Surgery

## 2019-02-24 DIAGNOSIS — Z01812 Encounter for preprocedural laboratory examination: Secondary | ICD-10-CM | POA: Insufficient documentation

## 2019-02-24 DIAGNOSIS — T8189XA Other complications of procedures, not elsewhere classified, initial encounter: Secondary | ICD-10-CM | POA: Diagnosis not present

## 2019-02-24 DIAGNOSIS — Z20828 Contact with and (suspected) exposure to other viral communicable diseases: Secondary | ICD-10-CM | POA: Insufficient documentation

## 2019-02-25 DIAGNOSIS — T8189XA Other complications of procedures, not elsewhere classified, initial encounter: Secondary | ICD-10-CM | POA: Diagnosis not present

## 2019-02-26 ENCOUNTER — Encounter (HOSPITAL_COMMUNITY): Payer: Self-pay | Admitting: *Deleted

## 2019-02-26 ENCOUNTER — Encounter (HOSPITAL_COMMUNITY)
Admission: RE | Admit: 2019-02-26 | Discharge: 2019-02-26 | Disposition: A | Discharge status: Home / Self Care | Payer: BC Managed Care – PPO | Source: Ambulatory Visit | Attending: Nurse Practitioner | Admitting: Nurse Practitioner

## 2019-02-26 ENCOUNTER — Other Ambulatory Visit: Payer: Self-pay

## 2019-02-26 DIAGNOSIS — R739 Hyperglycemia, unspecified: Secondary | ICD-10-CM | POA: Diagnosis not present

## 2019-02-26 DIAGNOSIS — Z483 Aftercare following surgery for neoplasm: Secondary | ICD-10-CM | POA: Diagnosis not present

## 2019-02-26 DIAGNOSIS — C189 Malignant neoplasm of colon, unspecified: Secondary | ICD-10-CM | POA: Diagnosis not present

## 2019-02-26 DIAGNOSIS — T8189XA Other complications of procedures, not elsewhere classified, initial encounter: Secondary | ICD-10-CM | POA: Diagnosis not present

## 2019-02-26 DIAGNOSIS — I10 Essential (primary) hypertension: Secondary | ICD-10-CM | POA: Diagnosis not present

## 2019-02-26 DIAGNOSIS — D490 Neoplasm of unspecified behavior of digestive system: Secondary | ICD-10-CM | POA: Insufficient documentation

## 2019-02-26 DIAGNOSIS — K7689 Other specified diseases of liver: Secondary | ICD-10-CM | POA: Diagnosis not present

## 2019-02-26 DIAGNOSIS — Z433 Encounter for attention to colostomy: Secondary | ICD-10-CM | POA: Diagnosis not present

## 2019-02-26 DIAGNOSIS — C787 Secondary malignant neoplasm of liver and intrahepatic bile duct: Secondary | ICD-10-CM | POA: Diagnosis not present

## 2019-02-26 DIAGNOSIS — C19 Malignant neoplasm of rectosigmoid junction: Secondary | ICD-10-CM | POA: Diagnosis not present

## 2019-02-26 LAB — NOVEL CORONAVIRUS, NAA (HOSP ORDER, SEND-OUT TO REF LAB; TAT 18-24 HRS): SARS-CoV-2, NAA: NOT DETECTED

## 2019-02-26 LAB — GLUCOSE, CAPILLARY: Glucose-Capillary: 90 mg/dL (ref 70–99)

## 2019-02-26 MED ORDER — FLUDEOXYGLUCOSE F - 18 (FDG) INJECTION
9.7000 | Freq: Once | INTRAVENOUS | Status: AC
  Administered 2019-02-26: 9.7 via INTRAVENOUS

## 2019-02-26 NOTE — Progress Notes (Signed)
Spoke with pt for pre-op call. Pt denies cardiac history or Diabetes.   Pt had her Covid test on 02/24/19 and result is pending. Pt states she's been in quarantine except to go to the hospital for her PET Scan today and she will go to the [facility] tomorrow. She understands she needs to wear her mask, social distance and wash hands.

## 2019-02-27 ENCOUNTER — Inpatient Hospital Stay: Payer: BC Managed Care – PPO

## 2019-02-27 ENCOUNTER — Other Ambulatory Visit: Payer: Self-pay

## 2019-02-27 ENCOUNTER — Inpatient Hospital Stay (HOSPITAL_BASED_OUTPATIENT_CLINIC_OR_DEPARTMENT_OTHER): Payer: BC Managed Care – PPO | Admitting: Hematology

## 2019-02-27 VITALS — BP 160/66 | HR 66 | Temp 98.2°F | Resp 20 | Ht 67.0 in | Wt 186.9 lb

## 2019-02-27 DIAGNOSIS — C787 Secondary malignant neoplasm of liver and intrahepatic bile duct: Secondary | ICD-10-CM

## 2019-02-27 DIAGNOSIS — C189 Malignant neoplasm of colon, unspecified: Secondary | ICD-10-CM | POA: Diagnosis not present

## 2019-02-27 DIAGNOSIS — R630 Anorexia: Secondary | ICD-10-CM | POA: Diagnosis not present

## 2019-02-27 DIAGNOSIS — I10 Essential (primary) hypertension: Secondary | ICD-10-CM | POA: Diagnosis not present

## 2019-02-27 DIAGNOSIS — C184 Malignant neoplasm of transverse colon: Secondary | ICD-10-CM | POA: Diagnosis not present

## 2019-02-27 DIAGNOSIS — T8189XA Other complications of procedures, not elsewhere classified, initial encounter: Secondary | ICD-10-CM | POA: Diagnosis not present

## 2019-02-27 DIAGNOSIS — D509 Iron deficiency anemia, unspecified: Secondary | ICD-10-CM | POA: Diagnosis not present

## 2019-02-27 DIAGNOSIS — Z87891 Personal history of nicotine dependence: Secondary | ICD-10-CM | POA: Diagnosis not present

## 2019-02-27 NOTE — Progress Notes (Signed)
Nutrition Assessment   Reason for Assessment:  Referral from Lacie, NP, weight loss   ASSESSMENT:   57 year old female with stage IV colon cancer with mets to liver.  Past medical history of anemia, HTN, tobacco abuse.  Noted hospital admission and s/p colectomy with end colostomy and wound vac for abdominal wound.    Met with patient following MD appointment.   Patient reports that her appetite is decreased and has been since prior to surgery.  Reports that she is eating 1/2 of normal portion of foods.  Reports that she has been trying to eat eggs, bacon/sausage and fruit for breakfast, soups at lunch and meat and vegetables at dinner.  Has bought some ensure/boost shakes to try.    Planning to start chemotherapy on 12/7.     Nutrition Focused Physical Exam: deferred   Medications: imodium, fibercon, zofran   Labs: reviewed   Anthropometrics:   Height: 67 inches Weight: 186 lb  14.4 oz today UBW: 220s per patient 1-2 years ago Noted 203 lb on 01/24/19  BMI: 29  8% weight loss in the last month, significant   Estimated Energy Needs  Kcals: 1900-2150 Protein: 95-108 g Fluid: 2 L   NUTRITION DIAGNOSIS: Inadequate oral intake related cancer and cancer related treatment as evidenced by 8% weight loss in the last month   INTERVENTION:  Discussed Colostomy Nutrition Therapy from [facility]. Handout provided.  Encouraged oral nutrition supplements and provided samples.   Contact information provided   MONITORING, EVALUATION, GOAL: Patient will consume adequate calories and protein to maintain weight.   Next Visit: to be determined with treatment  Joli B. Allen, RD, LDN Registered Dietitian [phone] (pager)        

## 2019-02-27 NOTE — Anesthesia Preprocedure Evaluation (Addendum)
Anesthesia Evaluation  Patient identified by MRN, date of birth, ID band Patient awake    Reviewed: Allergy & Precautions, NPO status , Patient's Chart, lab work & pertinent test results  History of Anesthesia Complications Negative for: history of anesthetic complications  Airway Mallampati: I  TM Distance: >3 FB Neck ROM: Full    Dental  (+) Edentulous Upper, Edentulous Lower, Dental Advisory Given   Pulmonary neg pulmonary ROS, Current Smoker, former smoker,    Pulmonary exam normal        Cardiovascular hypertension, Pt. on home beta blockers Normal cardiovascular exam     Neuro/Psych negative neurological ROS  negative psych ROS   GI/Hepatic Neg liver ROS, Colon Ca   Endo/Other  negative endocrine ROS  Renal/GU negative Renal ROS  negative genitourinary   Musculoskeletal negative musculoskeletal ROS (+)   Abdominal (+) - obese,   Peds negative pediatric ROS (+)  Hematology negative hematology ROS (+)   Anesthesia Other Findings   Reproductive/Obstetrics negative OB ROS                            Anesthesia Physical  Anesthesia Plan  ASA: III  Anesthesia Plan: General   Post-op Pain Management:    Induction: Intravenous  PONV Risk Score and Plan: 3 and Ondansetron, Midazolam, Treatment may vary due to age or medical condition and Dexamethasone  Airway Management Planned: LMA  Additional Equipment:   Intra-op Plan:   Post-operative Plan: Extubation in OR  Informed Consent: I have reviewed the patients History and Physical, chart, labs and discussed the procedure including the risks, benefits and alternatives for the proposed anesthesia with the patient or authorized representative who has indicated his/her understanding and acceptance.     Dental advisory given  Plan Discussed with: Anesthesiologist and CRNA  Anesthesia Plan Comments:       Anesthesia Quick  Evaluation

## 2019-02-28 ENCOUNTER — Ambulatory Visit (HOSPITAL_COMMUNITY)
Admission: RE | Admit: 2019-02-28 | Discharge: 2019-02-28 | Disposition: A | Discharge status: Home / Self Care | Payer: BC Managed Care – PPO | Attending: Surgery | Admitting: Surgery

## 2019-02-28 ENCOUNTER — Ambulatory Visit (HOSPITAL_COMMUNITY): Payer: BC Managed Care – PPO

## 2019-02-28 ENCOUNTER — Ambulatory Visit (HOSPITAL_COMMUNITY): Payer: BC Managed Care – PPO | Admitting: Anesthesiology

## 2019-02-28 ENCOUNTER — Other Ambulatory Visit: Payer: Self-pay

## 2019-02-28 ENCOUNTER — Encounter (HOSPITAL_COMMUNITY): Payer: Self-pay

## 2019-02-28 ENCOUNTER — Encounter: Payer: Self-pay | Admitting: Hematology

## 2019-02-28 ENCOUNTER — Telehealth: Payer: Self-pay | Admitting: Hematology

## 2019-02-28 ENCOUNTER — Encounter (HOSPITAL_COMMUNITY)
Admission: RE | Disposition: A | Discharge status: Home / Self Care | Payer: Self-pay | Source: Home / Self Care | Attending: Surgery

## 2019-02-28 DIAGNOSIS — D509 Iron deficiency anemia, unspecified: Secondary | ICD-10-CM | POA: Insufficient documentation

## 2019-02-28 DIAGNOSIS — I10 Essential (primary) hypertension: Secondary | ICD-10-CM | POA: Diagnosis not present

## 2019-02-28 DIAGNOSIS — C184 Malignant neoplasm of transverse colon: Secondary | ICD-10-CM | POA: Diagnosis not present

## 2019-02-28 DIAGNOSIS — C773 Secondary and unspecified malignant neoplasm of axilla and upper limb lymph nodes: Secondary | ICD-10-CM | POA: Diagnosis not present

## 2019-02-28 DIAGNOSIS — Z87891 Personal history of nicotine dependence: Secondary | ICD-10-CM | POA: Insufficient documentation

## 2019-02-28 DIAGNOSIS — C19 Malignant neoplasm of rectosigmoid junction: Secondary | ICD-10-CM | POA: Insufficient documentation

## 2019-02-28 DIAGNOSIS — Z933 Colostomy status: Secondary | ICD-10-CM | POA: Insufficient documentation

## 2019-02-28 DIAGNOSIS — C189 Malignant neoplasm of colon, unspecified: Secondary | ICD-10-CM | POA: Diagnosis not present

## 2019-02-28 DIAGNOSIS — Z79899 Other long term (current) drug therapy: Secondary | ICD-10-CM | POA: Diagnosis not present

## 2019-02-28 DIAGNOSIS — Z452 Encounter for adjustment and management of vascular access device: Secondary | ICD-10-CM | POA: Insufficient documentation

## 2019-02-28 DIAGNOSIS — C787 Secondary malignant neoplasm of liver and intrahepatic bile duct: Secondary | ICD-10-CM | POA: Diagnosis not present

## 2019-02-28 DIAGNOSIS — T8189XA Other complications of procedures, not elsewhere classified, initial encounter: Secondary | ICD-10-CM | POA: Diagnosis not present

## 2019-02-28 DIAGNOSIS — Z419 Encounter for procedure for purposes other than remedying health state, unspecified: Secondary | ICD-10-CM

## 2019-02-28 DIAGNOSIS — K56609 Unspecified intestinal obstruction, unspecified as to partial versus complete obstruction: Secondary | ICD-10-CM | POA: Diagnosis not present

## 2019-02-28 DIAGNOSIS — Z95828 Presence of other vascular implants and grafts: Secondary | ICD-10-CM

## 2019-02-28 DIAGNOSIS — C785 Secondary malignant neoplasm of large intestine and rectum: Secondary | ICD-10-CM | POA: Diagnosis not present

## 2019-02-28 HISTORY — DX: Anemia, unspecified: D64.9

## 2019-02-28 HISTORY — PX: PORTACATH PLACEMENT: SHX2246

## 2019-02-28 HISTORY — DX: Malignant (primary) neoplasm, unspecified: C80.1

## 2019-02-28 SURGERY — INSERTION, TUNNELED CENTRAL VENOUS DEVICE, WITH PORT
Anesthesia: General | Site: Chest | Laterality: Right

## 2019-02-28 MED ORDER — BUPIVACAINE HCL (PF) 0.25 % IJ SOLN
INTRAMUSCULAR | Status: AC
  Filled 2019-02-28: qty 30

## 2019-02-28 MED ORDER — SODIUM CHLORIDE 0.9 % IV SOLN: 250.0000 mL | INTRAVENOUS | Status: DC | PRN

## 2019-02-28 MED ORDER — SODIUM CHLORIDE 0.9% FLUSH: 3.0000 mL | Freq: Two times a day (BID) | INTRAVENOUS | Status: DC

## 2019-02-28 MED ORDER — LIDOCAINE 2% (20 MG/ML) 5 ML SYRINGE
INTRAMUSCULAR | Status: DC | PRN
  Administered 2019-02-28: 100 mg via INTRAVENOUS

## 2019-02-28 MED ORDER — ACETAMINOPHEN 650 MG RE SUPP: 650.0000 mg | RECTAL | Status: DC | PRN

## 2019-02-28 MED ORDER — FENTANYL CITRATE (PF) 100 MCG/2ML IJ SOLN: 25.0000 ug | INTRAMUSCULAR | Status: DC | PRN

## 2019-02-28 MED ORDER — PROPOFOL 10 MG/ML IV BOLUS
INTRAVENOUS | Status: AC
  Filled 2019-02-28: qty 40

## 2019-02-28 MED ORDER — HYDRALAZINE HCL 20 MG/ML IJ SOLN
10.0000 mg | Freq: Once | INTRAMUSCULAR | Status: AC
  Administered 2019-02-28: 10 mg via INTRAVENOUS

## 2019-02-28 MED ORDER — BUPIVACAINE HCL (PF) 0.25 % IJ SOLN
INTRAMUSCULAR | Status: DC | PRN
  Administered 2019-02-28: 5.5 mL

## 2019-02-28 MED ORDER — SODIUM CHLORIDE 0.9 % IV SOLN
INTRAVENOUS | Status: AC
  Filled 2019-02-28: qty 1.2

## 2019-02-28 MED ORDER — HEPARIN SOD (PORK) LOCK FLUSH 100 UNIT/ML IV SOLN
INTRAVENOUS | Status: AC
  Filled 2019-02-28: qty 5

## 2019-02-28 MED ORDER — HEPARIN SOD (PORK) LOCK FLUSH 100 UNIT/ML IV SOLN
INTRAVENOUS | Status: DC | PRN
  Administered 2019-02-28: 500 [IU]

## 2019-02-28 MED ORDER — PHENYLEPHRINE HCL (PRESSORS) 10 MG/ML IV SOLN
INTRAVENOUS | Status: DC | PRN
  Administered 2019-02-28 (×3): 80 ug via INTRAVENOUS

## 2019-02-28 MED ORDER — EPHEDRINE SULFATE 50 MG/ML IJ SOLN
INTRAMUSCULAR | Status: DC | PRN
  Administered 2019-02-28: 5 mg via INTRAVENOUS

## 2019-02-28 MED ORDER — CELECOXIB 200 MG PO CAPS
400.0000 mg | ORAL_CAPSULE | Freq: Once | ORAL | Status: AC
  Administered 2019-02-28: 400 mg via ORAL
  Filled 2019-02-28: qty 2

## 2019-02-28 MED ORDER — PROMETHAZINE HCL 25 MG/ML IJ SOLN: 6.2500 mg | INTRAMUSCULAR | Status: DC | PRN

## 2019-02-28 MED ORDER — SODIUM CHLORIDE 0.9 % IV SOLN
INTRAVENOUS | Status: DC | PRN
  Administered 2019-02-28: 500 mL

## 2019-02-28 MED ORDER — MIDAZOLAM HCL 2 MG/2ML IJ SOLN
INTRAMUSCULAR | Status: AC
  Filled 2019-02-28: qty 2

## 2019-02-28 MED ORDER — LACTATED RINGERS IV SOLN
INTRAVENOUS | Status: DC
  Administered 2019-02-28: 07:00:00 via INTRAVENOUS

## 2019-02-28 MED ORDER — FENTANYL CITRATE (PF) 250 MCG/5ML IJ SOLN
INTRAMUSCULAR | Status: DC | PRN
  Administered 2019-02-28 (×2): 25 ug via INTRAVENOUS

## 2019-02-28 MED ORDER — MIDAZOLAM HCL 5 MG/5ML IJ SOLN
INTRAMUSCULAR | Status: DC | PRN
  Administered 2019-02-28 (×2): 1 mg via INTRAVENOUS

## 2019-02-28 MED ORDER — ACETAMINOPHEN 500 MG PO TABS
1000.0000 mg | ORAL_TABLET | Freq: Once | ORAL | Status: AC
  Administered 2019-02-28: 1000 mg via ORAL
  Filled 2019-02-28: qty 2

## 2019-02-28 MED ORDER — CHLORHEXIDINE GLUCONATE CLOTH 2 % EX PADS: 6.0000 | MEDICATED_PAD | Freq: Once | CUTANEOUS | Status: DC

## 2019-02-28 MED ORDER — OXYCODONE HCL 5 MG PO TABS: 5.0000 mg | ORAL_TABLET | ORAL | Status: DC | PRN

## 2019-02-28 MED ORDER — 0.9 % SODIUM CHLORIDE (POUR BTL) OPTIME
TOPICAL | Status: DC | PRN
  Administered 2019-02-28: 1000 mL

## 2019-02-28 MED ORDER — CEFAZOLIN SODIUM-DEXTROSE 2-4 GM/100ML-% IV SOLN
2.0000 g | INTRAVENOUS | Status: AC
  Administered 2019-02-28: 2 g via INTRAVENOUS
  Filled 2019-02-28: qty 100

## 2019-02-28 MED ORDER — HYDRALAZINE HCL 20 MG/ML IJ SOLN
INTRAMUSCULAR | Status: AC
  Filled 2019-02-28: qty 1

## 2019-02-28 MED ORDER — FENTANYL CITRATE (PF) 250 MCG/5ML IJ SOLN
INTRAMUSCULAR | Status: AC
  Filled 2019-02-28: qty 5

## 2019-02-28 MED ORDER — SODIUM CHLORIDE 0.9% FLUSH: 3.0000 mL | INTRAVENOUS | Status: DC | PRN

## 2019-02-28 MED ORDER — DEXAMETHASONE SODIUM PHOSPHATE 10 MG/ML IJ SOLN
INTRAMUSCULAR | Status: DC | PRN
  Administered 2019-02-28: 5 mg via INTRAVENOUS

## 2019-02-28 MED ORDER — PROPOFOL 10 MG/ML IV BOLUS
INTRAVENOUS | Status: DC | PRN
  Administered 2019-02-28: 20 mg via INTRAVENOUS

## 2019-02-28 MED ORDER — STERILE WATER FOR IRRIGATION IR SOLN
Status: DC | PRN
  Administered 2019-02-28: 1000 mL

## 2019-02-28 MED ORDER — ONDANSETRON HCL 4 MG/2ML IJ SOLN
INTRAMUSCULAR | Status: DC | PRN
  Administered 2019-02-28: 4 mg via INTRAVENOUS

## 2019-02-28 MED ORDER — ACETAMINOPHEN 325 MG PO TABS: 650.0000 mg | ORAL_TABLET | ORAL | Status: DC | PRN

## 2019-02-28 SURGICAL SUPPLY — 42 items
ADH SKN CLS APL DERMABOND .7 (GAUZE/BANDAGES/DRESSINGS) ×1
APL PRP STRL LF DISP 70% ISPRP (MISCELLANEOUS) ×1
BAG DECANTER FOR FLEXI CONT (MISCELLANEOUS) ×3 IMPLANT
CANISTER SUCT 3000ML PPV (MISCELLANEOUS) IMPLANT
CHLORAPREP W/TINT 26 (MISCELLANEOUS) ×3 IMPLANT
COVER SURGICAL LIGHT HANDLE (MISCELLANEOUS) ×3 IMPLANT
COVER TRANSDUCER ULTRASND GEL (DRAPE) ×2 IMPLANT
DECANTER SPIKE VIAL GLASS SM (MISCELLANEOUS) ×4 IMPLANT
DERMABOND ADVANCED (GAUZE/BANDAGES/DRESSINGS) ×2
DERMABOND ADVANCED .7 DNX12 (GAUZE/BANDAGES/DRESSINGS) ×1 IMPLANT
DRAPE C-ARM 42X120 X-RAY (DRAPES) ×3 IMPLANT
DRAPE CHEST BREAST 15X10 FENES (DRAPES) ×3 IMPLANT
DRAPE UTILITY XL STRL (DRAPES) ×2 IMPLANT
DRAPE WARM FLUID 44X44 (DRAPES) IMPLANT
ELECT COATED BLADE 2.86 ST (ELECTRODE) ×3 IMPLANT
ELECT REM PT RETURN 9FT ADLT (ELECTROSURGICAL) ×3
ELECTRODE REM PT RTRN 9FT ADLT (ELECTROSURGICAL) ×1 IMPLANT
GAUZE 4X4 16PLY RFD (DISPOSABLE) ×1 IMPLANT
GEL ULTRASOUND 20GR AQUASONIC (MISCELLANEOUS) IMPLANT
GLOVE BIO SURGEON STRL SZ 6 (GLOVE) ×3 IMPLANT
GLOVE INDICATOR 6.5 STRL GRN (GLOVE) ×3 IMPLANT
GOWN STRL REUS W/ TWL LRG LVL3 (GOWN DISPOSABLE) ×1 IMPLANT
GOWN STRL REUS W/TWL LRG LVL3 (GOWN DISPOSABLE) ×3
KIT BASIN OR (CUSTOM PROCEDURE TRAY) ×3 IMPLANT
KIT PORT POWER 8FR ISP CVUE (Port) ×2 IMPLANT
KIT TURNOVER KIT B (KITS) ×3 IMPLANT
NEEDLE 22X1 1/2 (OR ONLY) (NEEDLE) ×3 IMPLANT
NS IRRIG 1000ML POUR BTL (IV SOLUTION) ×3 IMPLANT
PAD ARMBOARD 7.5X6 YLW CONV (MISCELLANEOUS) ×3 IMPLANT
PENCIL BUTTON HOLSTER BLD 10FT (ELECTRODE) ×3 IMPLANT
POSITIONER HEAD DONUT 9IN (MISCELLANEOUS) ×3 IMPLANT
SUT MON AB 4-0 PC3 18 (SUTURE) ×3 IMPLANT
SUT PROLENE 2 0 SH DA (SUTURE) ×6 IMPLANT
SUT VIC AB 3-0 SH 27 (SUTURE) ×3
SUT VIC AB 3-0 SH 27X BRD (SUTURE) ×1 IMPLANT
SYR 5ML LUER SLIP (SYRINGE) ×3 IMPLANT
TOWEL GREEN STERILE (TOWEL DISPOSABLE) ×3 IMPLANT
TOWEL GREEN STERILE FF (TOWEL DISPOSABLE) ×3 IMPLANT
TRAY LAPAROSCOPIC MC (CUSTOM PROCEDURE TRAY) ×3 IMPLANT
TUBE CONNECTING 12'X1/4 (SUCTIONS)
TUBE CONNECTING 12X1/4 (SUCTIONS) IMPLANT
YANKAUER SUCT BULB TIP NO VENT (SUCTIONS) IMPLANT

## 2019-02-28 NOTE — Op Note (Addendum)
Operative Note  NICY SOLO  [id number]  [id number]  02/28/2019   Surgeon: Vikki Ports A ConnorMD  Assistant: OR staff  Procedure performed: Right IJ port-a-cath insertion with ultrasound-guided venous access and fluoroscopy confirmation of placement  Preop diagnosis: Metastatic colon cancer Post-op diagnosis/intraop findings: Same  Specimens: None Retained items: Right IJ Port-A-Cath EBL: 10 cc Complications: none  Description of procedure: After obtaining informed consent the patient was taken to the operating room and placed supine on operating room table wheregeneral anesthesia was initiated, preoperative antibiotics were administered, SCDs applied, and a formal timeout was performed.  The patient's neck and chest were prepped and draped usual sterile fashion.  Ultrasound was used to access the right internal jugular vein and a wire was inserted, fluoroscopy used to confirm correct placement visualizing the wire going into the IVC.  A small incision was made over the wire in the neck.  We then turned to the right chest wall, 3 fingerbreadths below the clavicle in the midclavicular line a small incision was made and cautery was used to create a pocket for the port.  The port was connected to the tubing and then secured to the chest wall within the pocket at 3 points using Prolene sutures.  The tunneler was then used to bring the tubing under the subcutaneous tissues and out the small incision in the neck alongside the wire.  Fluoroscopy was then used again to approximate the location of the cavoatrial junction and the tubing was trimmed appropriately.  The dilator and sheath were then inserted over the wire again confirming placement with fluoroscopy.  The dilator and wire were then removed and the tubing was inserted into the sheath which was then peeled away.  The port was then aspirated, returning blood, and then injected easily with heparinized saline.  Fluoroscopy was once again  used to confirm placement of the port tubing in the superior vena cava.  The port was flushed with concentrated heparin. The incisions were then closed, the port site closed with interrupted deep dermal 3-0 Vicryls followed by running subcuticular Monocryl, and subcuticular Monocryl and neck incision.  Dermabond was applied to the incisions.  The patient was then awakened, extubated and taken to PACU in stable condition.   All counts were correct at the completion of the case.

## 2019-02-28 NOTE — Transfer of Care (Signed)
Immediate Anesthesia Transfer of Care Note  Patient: JOYCELINE LICATA  Procedure(s) Performed: INSERTION PORT-A-CATH WITH ULTRASOUND GUIDANCE (Right Chest)  Patient Location: PACU  Anesthesia Type:General  Level of Consciousness: awake, alert , oriented, patient cooperative and responds to stimulation  Airway & Oxygen Therapy: Patient Spontanous Breathing and Patient connected to face mask oxygen  Post-op Assessment: Report given to RN, Post -op Vital signs reviewed and stable and Patient moving all extremities X 4  Post vital signs: Reviewed and stable  Last Vitals:  Vitals Value Taken Time  BP 201/78 02/28/19 0937  Temp    Pulse 63 02/28/19 0938  Resp 17 02/28/19 0938  SpO2 100 % 02/28/19 0938  Vitals shown include unvalidated device data.  Last Pain:  Vitals:   02/28/19 0706  TempSrc:   PainSc: 0-No pain      Patients Stated Pain Goal: 3 ([id number])  Complications: No apparent anesthesia complications

## 2019-02-28 NOTE — Anesthesia Postprocedure Evaluation (Signed)
Anesthesia Post Note  Patient: Casey Black  Procedure(s) Performed: INSERTION PORT-A-CATH WITH ULTRASOUND GUIDANCE (Right Chest)     Patient location during evaluation: PACU Anesthesia Type: General Level of consciousness: sedated Pain management: pain level controlled Vital Signs Assessment: post-procedure vital signs reviewed and stable Respiratory status: spontaneous breathing and respiratory function stable Cardiovascular status: stable Postop Assessment: no apparent nausea or vomiting Anesthetic complications: no    Last Vitals:  Vitals:   02/28/19 0958 02/28/19 1008  BP:  (!) 195/75  Pulse: 61 (!) 53  Resp: 17 12  Temp:    SpO2: 98% 97%    Last Pain:  Vitals:   02/28/19 0935  TempSrc:   PainSc: 0-No pain                 Ethelreda Sukhu DANIEL

## 2019-02-28 NOTE — Discharge Instructions (Signed)
    PORT-A-CATH: POST OP INSTRUCTIONS  Always review your discharge instruction sheet given to you by the facility where your surgery was performed.   1. A prescription for pain medication may be given to you upon discharge. Take your pain medication as prescribed, if needed. If narcotic pain medicine is not needed, then you make take acetaminophen (Tylenol) or ibuprofen (Advil) as needed.  2. Take your usually prescribed medications unless otherwise directed. 3. If you need a refill on your pain medication, please contact our office. All narcotic pain medicine now requires a paper prescription.  Phoned in and fax refills are no longer allowed by law.  Prescriptions will not be filled after 5 pm or on weekends.  4. You should follow a light diet for the remainder of the day after your procedure. 5. Most patients will experience some mild swelling and/or bruising in the area of the incision. It may take several days to resolve. 6. It is common to experience some constipation if taking pain medication after surgery. Increasing fluid intake and taking a stool softener (such as Colace) will usually help or prevent this problem from occurring. A mild laxative (Milk of Magnesia or Miralax) should be taken according to package directions if there are no bowel movements after 48 hours.  7. Unless discharge instructions indicate otherwise, you may remove your bandages 48 hours after surgery, and you may shower at that time. You may have steri-strips (small white skin tapes) in place directly over the incision.  These strips should be left on the skin for 7-10 days.  If your surgeon used Dermabond (skin glue) on the incision, you may shower in 24 hours.  The glue will flake off over the next 2-3 weeks.  8. If your port is left accessed at the end of surgery (needle left in port), the dressing cannot get wet and should only by changed by a healthcare professional. When the port is no longer accessed (when the  needle has been removed), follow step 7.   9. ACTIVITIES:  Limit activity involving your arms for the next 72 hours. Do no strenuous exercise or activity for 1 week. You may drive when you are no longer taking prescription pain medication, you can comfortably wear a seatbelt, and you can maneuver your car. 10.You may need to see your doctor in the office for a follow-up appointment.  Please       check with your doctor.  11.When you receive a new Port-a-Cath, you will get a product guide and        ID card.  Please keep them in case you need them.  WHEN TO CALL YOUR DOCTOR ([phone]): 1. Fever over 101.0 2. Chills 3. Continued bleeding from incision 4. Increased redness and tenderness at the site 5. Shortness of breath, difficulty breathing   The clinic staff is available to answer your questions during regular business hours. Please don't hesitate to call and ask to speak to one of the nurses or medical assistants for clinical concerns. If you have a medical emergency, go to the nearest emergency room or call 911.  A surgeon from [facility] is always on call at the hospital.     For further information, please visit [email or web address]      

## 2019-02-28 NOTE — H&P (Signed)
Surgical H&P  HPI: For port placement  No Known Allergies  Past Medical History:  Diagnosis Date  . Anemia    low iron  . Cancer [facility])    colon cancer  . Hypertension 01/23/2019  . SBO (small bowel obstruction) ([place]) 01/23/2019    Past Surgical History:  Procedure Laterality Date  . CESAREAN SECTION     x2  . COLOSTOMY N/A 01/24/2019   Procedure: End Loop Colostomy;  Surgeon: Clovis Riley, MD;  Location: [facility];  Service: General;  Laterality: N/A;  . PARTIAL COLECTOMY N/A 01/24/2019   Procedure: PARTIAL COLECTOMY;  Surgeon: Clovis Riley, MD;  Location: [facility] OR;  Service: General;  Laterality: N/A;    Family History  Problem Relation Age of Onset  . Kidney failure Father   . Hypertension Father   . Hypertension Sister     Social History   Socioeconomic History  . Marital status: Married    Spouse name: Not on file  . Number of children: 2  . Years of education: Not on file  . Highest education level: Not on file  Occupational History  . Not on file  Social Needs  . Financial resource strain: Not on file  . Food insecurity    Worry: Not on file    Inability: Not on file  . Transportation needs    Medical: Not on file    Non-medical: Not on file  Tobacco Use  . Smoking status: Former Smoker    Packs/day: 0.50    Years: 29.00    Pack years: 14.50    Types: Cigarettes    Quit date: 01/04/2019    Years since quitting: 0.1  . Smokeless tobacco: Never Used  . Tobacco comment: desires patch  Substance and Sexual Activity  . Alcohol use: Not Currently    Comment: a pint a week  . Drug use: Never  . Sexual activity: Not on file  Lifestyle  . Physical activity    Days per week: Not on file    Minutes per session: Not on file  . Stress: Not on file  Relationships  . Social Herbalist on phone: Not on file    Gets together: Not on file    Attends religious service: Not on file    Active member of club or organization: Not on file   Attends meetings of clubs or organizations: Not on file    Relationship status: Not on file  Other Topics Concern  . Not on file  Social History Narrative  . Not on file    No current facility-administered medications on file prior to encounter.    Current Outpatient Medications on File Prior to Encounter  Medication Sig Dispense Refill  . acetaminophen (TYLENOL) 325 MG tablet Take 2 tablets (650 mg total) by mouth every 6 (six) hours as needed.    . ferrous sulfate 325 (65 FE) MG tablet Take 1 tablet (325 mg total) by mouth 2 (two) times daily with a meal. 30 tablet 3  . hydrALAZINE (APRESOLINE) 25 MG tablet Take 1 tablet (25 mg total) by mouth every 8 (eight) hours. (Patient taking differently: Take 25 mg by mouth 3 (three) times daily. ) 90 tablet 0  . Ibuprofen (ADVIL) 200 MG CAPS Take 400 mg by mouth daily as needed (pain).    . isosorbide mononitrate (IMDUR) 30 MG 24 hr tablet Take 1 tablet (30 mg total) by mouth daily. 30 tablet 0  . lisinopril (ZESTRIL)  40 MG tablet Take 1 tablet (40 mg total) by mouth daily. 30 tablet 0  . loperamide (IMODIUM) 2 MG capsule Take 1 capsule (2 mg total) by mouth as needed for diarrhea or loose stools. (Patient taking differently: Take 2 mg by mouth 3 (three) times daily. ) 90 capsule 0  . metoprolol tartrate (LOPRESSOR) 50 MG tablet Take 1 tablet (50 mg total) by mouth 2 (two) times daily. 60 tablet 0  . nicotine (NICODERM CQ - DOSED IN MG/24 HR) 7 mg/24hr patch Place 1 patch (7 mg total) onto the skin daily. 7 patch 0  . ondansetron (ZOFRAN) 4 MG tablet Take 1 tablet (4 mg total) by mouth every 6 (six) hours as needed for nausea. 30 tablet 0  . oxyCODONE 10 MG TABS Take 0.5-1 tablets (5-10 mg total) by mouth every 4 (four) hours as needed for moderate pain or severe pain (5 moderate, 10 severe). 20 tablet 0  . lidocaine-prilocaine (EMLA) cream Apply to affected area once (Patient taking differently: Apply 1 application topically once. Apply to affected  area once) 30 g 3  . ondansetron (ZOFRAN) 8 MG tablet Take 1 tablet (8 mg total) by mouth 2 (two) times daily as needed for refractory nausea / vomiting. Start on day 3 after chemotherapy. 30 tablet 1  . polycarbophil (FIBERCON) 625 MG tablet Take 1 tablet (625 mg total) by mouth daily. 30 tablet 0  . prochlorperazine (COMPAZINE) 10 MG tablet Take 1 tablet (10 mg total) by mouth every 6 (six) hours as needed (Nausea or vomiting). 30 tablet 1    Review of Systems: a complete, 10pt review of systems was completed with pertinent positives and negatives as documented in the HPI  Physical Exam: Vitals:   02/28/19 0620  BP: (!) 185/52  Pulse: 60  Resp: 17  Temp: 98.9 F (37.2 C)  SpO2: 100%   Gen: A&Ox3, no distress    CBC Latest Ref Rng & Units 02/23/2019 02/03/2019 02/02/2019  WBC 4.0 - 10.5 K/uL 6.9 11.5(H) 12.5(H)  Hemoglobin 12.0 - 15.0 g/dL 8.9(L) 8.9(L) 8.7(L)  Hematocrit 36.0 - 46.0 % 28.8(L) 28.3(L) 26.9(L)  Platelets 150 - 400 K/uL 214 342 340    CMP Latest Ref Rng & Units 02/23/2019 02/03/2019 02/02/2019  Glucose 70 - 99 mg/dL 94 103(H) 84  BUN 6 - 20 mg/dL 7 14 21(H)  Creatinine 0.44 - 1.00 mg/dL 0.88 0.77 0.82  Sodium 135 - 145 mmol/L 137 134(L) 136  Potassium 3.5 - 5.1 mmol/L 4.7 4.2 4.2  Chloride 98 - 111 mmol/L 102 102 101  CO2 22 - 32 mmol/L 27 20(L) 24  Calcium 8.9 - 10.3 mg/dL 9.4 8.4(L) 8.2(L)  Total Protein 6.5 - 8.1 g/dL 7.1 - -  Total Bilirubin 0.3 - 1.2 mg/dL 0.4 - -  Alkaline Phos 38 - 126 U/L 92 - -  AST 15 - 41 U/L 14(L) - -  ALT 0 - 44 U/L 11 - -    No results found for: INR, PROTIME  Imaging: Nm Pet Image Initial (pi) Skull Base To Thigh  Result Date: 02/26/2019 CLINICAL DATA:  Initial treatment strategy for colorectal cancer. EXAM: NUCLEAR MEDICINE PET SKULL BASE TO THIGH TECHNIQUE: 9.7 mCi F-18 FDG was injected intravenously. Full-ring PET imaging was performed from the skull base to thigh after the radiotracer. CT data was obtained and used  for attenuation correction and anatomic localization. Fasting blood glucose: 90 mg/dl COMPARISON:  CT abdomen pelvis 01/23/2019. FINDINGS: Mediastinal blood pool activity: SUV max  3.0 Liver activity: SUV max NA NECK: No abnormal hypermetabolism. Incidental CT findings: None. CHEST: There are hypermetabolic lymph nodes in the mediastinum, both hilar regions and both axillary regions. Index low right paratracheal lymph node measures 7 mm (4/55) with an SUV max of 4.8. Index left hilar hypermetabolism, SUV max 7.3. Index right axillary lymph node measures 8 mm (4/62) with an SUV max 6.4. Left periaortic lymph node measures 6 mm (4/75) has an SUV max of 7.1. No hypermetabolic pulmonary nodules. Incidental CT findings: Atherosclerotic calcification of the aorta and coronary arteries. Heart is enlarged. No pericardial effusion. ABDOMEN/PELVIS: Focal hypermetabolism in the periphery of the right hepatic lobe has an SUV max 12.8 and corresponds to heterogeneous lesions in the same location on 01/23/2019. A second index hyper metabolic lesion along the dome of the liver corresponds to minimal calcification on CT (4/86), with an SUV max 10.8, and may be within the left hepatic lobe. Additional areas of milder focal hypermetabolism are seen in the right hepatic lobe. No definite abnormal hypermetabolism in the adrenal glands, spleen or pancreas. Focal hypermetabolism medial to the right adrenal gland has an SUV max of 3.7 and likely corresponds to a hyperdense lymph node in the same location on 01/23/2019, not well seen on the current study. Hypermetabolism is seen along the ventral abdominal midline, associated with postoperative changes. Intense hypermetabolism is associated with the rectum, SUV max 14.1. Incidental CT findings: Liver, gallbladder, adrenal glands, kidneys, spleen, pancreas, stomach and small bowel are otherwise unremarkable. Right lower quadrant colostomy. SKELETON: No abnormal osseous hypermetabolism.  Incidental CT findings: None. IMPRESSION: 1. Hypermetabolic metastatic disease in the liver and mediastinal/hilar/axillary lymph nodes. 2. Focal hypermetabolism in the rectum. Continued attention on follow-up exams is warranted. 3. Focal hypermetabolism medial to the right adrenal gland may be within a metastatic lymph node, better visualized on 01/23/2019. 4. Aortic atherosclerosis (ICD10-170.0). Coronary artery calcification. Electronically Signed   By: Lorin Picket M.D.   On: 02/26/2019 14:55     A/P: Port placement for chemo. Discussed risks of surgery including bleeding, infection, pneumothorax, port displacement. Questions answered to her satisfaction.  Patient Active Problem List   Diagnosis Date Noted  . Goals of care, counseling/discussion 02/15/2019  . IDA (iron deficiency anemia) 02/02/2019  . Adenocarcinoma of colon metastatic to liver ([place]) 02/02/2019  . SBO (small bowel obstruction) ([place]) 01/23/2019  . Hypertension 01/23/2019  . Tobacco dependence 01/23/2019       Romana Juniper, MD [facility], [place]  See AMION to contact appropriate on-call provider

## 2019-02-28 NOTE — Telephone Encounter (Signed)
Scheduled appt per 11/24 los. °

## 2019-03-02 ENCOUNTER — Encounter (HOSPITAL_COMMUNITY): Payer: Self-pay | Admitting: Surgery

## 2019-03-02 DIAGNOSIS — R739 Hyperglycemia, unspecified: Secondary | ICD-10-CM | POA: Diagnosis not present

## 2019-03-02 DIAGNOSIS — C189 Malignant neoplasm of colon, unspecified: Secondary | ICD-10-CM | POA: Diagnosis not present

## 2019-03-02 DIAGNOSIS — Z483 Aftercare following surgery for neoplasm: Secondary | ICD-10-CM | POA: Diagnosis not present

## 2019-03-02 DIAGNOSIS — K7689 Other specified diseases of liver: Secondary | ICD-10-CM | POA: Diagnosis not present

## 2019-03-02 DIAGNOSIS — I10 Essential (primary) hypertension: Secondary | ICD-10-CM | POA: Diagnosis not present

## 2019-03-02 DIAGNOSIS — Z433 Encounter for attention to colostomy: Secondary | ICD-10-CM | POA: Diagnosis not present

## 2019-03-05 ENCOUNTER — Other Ambulatory Visit: Payer: Self-pay | Admitting: Hematology

## 2019-03-05 ENCOUNTER — Telehealth: Payer: Self-pay

## 2019-03-05 DIAGNOSIS — Z433 Encounter for attention to colostomy: Secondary | ICD-10-CM | POA: Diagnosis not present

## 2019-03-05 DIAGNOSIS — R739 Hyperglycemia, unspecified: Secondary | ICD-10-CM | POA: Diagnosis not present

## 2019-03-05 DIAGNOSIS — I10 Essential (primary) hypertension: Secondary | ICD-10-CM | POA: Diagnosis not present

## 2019-03-05 DIAGNOSIS — K7689 Other specified diseases of liver: Secondary | ICD-10-CM | POA: Diagnosis not present

## 2019-03-05 DIAGNOSIS — Z483 Aftercare following surgery for neoplasm: Secondary | ICD-10-CM | POA: Diagnosis not present

## 2019-03-05 DIAGNOSIS — C189 Malignant neoplasm of colon, unspecified: Secondary | ICD-10-CM | POA: Diagnosis not present

## 2019-03-05 MED ORDER — LISINOPRIL 40 MG PO TABS: 40.0000 mg | ORAL_TABLET | Freq: Every day | ORAL | 0 refills | Status: DC

## 2019-03-05 MED ORDER — ISOSORBIDE MONONITRATE ER 30 MG PO TB24: 30.0000 mg | ORAL_TABLET | Freq: Every day | ORAL | 0 refills | Status: DC

## 2019-03-05 MED ORDER — METOPROLOL TARTRATE 50 MG PO TABS: 50.0000 mg | ORAL_TABLET | Freq: Two times a day (BID) | ORAL | 0 refills | Status: DC

## 2019-03-05 MED ORDER — HYDRALAZINE HCL 25 MG PO TABS: 25.0000 mg | ORAL_TABLET | Freq: Three times a day (TID) | ORAL | 0 refills | Status: DC

## 2019-03-05 MED ORDER — LOPERAMIDE HCL 2 MG PO CAPS: 2.0000 mg | ORAL_CAPSULE | ORAL | 0 refills | Status: DC | PRN

## 2019-03-05 MED ORDER — FERROUS SULFATE 325 (65 FE) MG PO TABS: 325.0000 mg | ORAL_TABLET | Freq: Two times a day (BID) | ORAL | 3 refills | Status: DC

## 2019-03-05 NOTE — Telephone Encounter (Signed)
Patient's daughter calls stating that her mother still doesn't have a PCP, has made an appointment with one the end of December.  During her visit with Dr. Burr Medico she was told we could refill her medications in in the interim.  She needs the following refills:  Ferrous sulfate, Hydralazine, Isosorbide, Lisinopril, Imodium and Metoprolol.    She would like them sent into [facility] on file.

## 2019-03-05 NOTE — Telephone Encounter (Signed)
I have refilled all of these meds.   Truitt Merle MD

## 2019-03-06 ENCOUNTER — Encounter: Payer: Self-pay | Admitting: Hematology

## 2019-03-06 NOTE — Progress Notes (Signed)
Pt is approved for the $700 [facility] grant.  °

## 2019-03-07 DIAGNOSIS — Z433 Encounter for attention to colostomy: Secondary | ICD-10-CM | POA: Diagnosis not present

## 2019-03-07 DIAGNOSIS — I10 Essential (primary) hypertension: Secondary | ICD-10-CM | POA: Diagnosis not present

## 2019-03-07 DIAGNOSIS — C189 Malignant neoplasm of colon, unspecified: Secondary | ICD-10-CM | POA: Diagnosis not present

## 2019-03-07 DIAGNOSIS — R739 Hyperglycemia, unspecified: Secondary | ICD-10-CM | POA: Diagnosis not present

## 2019-03-07 DIAGNOSIS — Z483 Aftercare following surgery for neoplasm: Secondary | ICD-10-CM | POA: Diagnosis not present

## 2019-03-07 DIAGNOSIS — K7689 Other specified diseases of liver: Secondary | ICD-10-CM | POA: Diagnosis not present

## 2019-03-09 DIAGNOSIS — R739 Hyperglycemia, unspecified: Secondary | ICD-10-CM | POA: Diagnosis not present

## 2019-03-09 DIAGNOSIS — I10 Essential (primary) hypertension: Secondary | ICD-10-CM | POA: Diagnosis not present

## 2019-03-09 DIAGNOSIS — Z483 Aftercare following surgery for neoplasm: Secondary | ICD-10-CM | POA: Diagnosis not present

## 2019-03-09 DIAGNOSIS — C189 Malignant neoplasm of colon, unspecified: Secondary | ICD-10-CM | POA: Diagnosis not present

## 2019-03-09 DIAGNOSIS — Z433 Encounter for attention to colostomy: Secondary | ICD-10-CM | POA: Diagnosis not present

## 2019-03-09 DIAGNOSIS — K7689 Other specified diseases of liver: Secondary | ICD-10-CM | POA: Diagnosis not present

## 2019-03-12 ENCOUNTER — Other Ambulatory Visit: Payer: Self-pay

## 2019-03-12 ENCOUNTER — Inpatient Hospital Stay (HOSPITAL_BASED_OUTPATIENT_CLINIC_OR_DEPARTMENT_OTHER): Payer: BC Managed Care – PPO | Admitting: Nurse Practitioner

## 2019-03-12 ENCOUNTER — Inpatient Hospital Stay: Payer: BC Managed Care – PPO

## 2019-03-12 ENCOUNTER — Encounter: Payer: Self-pay | Admitting: Nurse Practitioner

## 2019-03-12 ENCOUNTER — Inpatient Hospital Stay: Payer: BC Managed Care – PPO | Attending: Nurse Practitioner

## 2019-03-12 VITALS — BP 194/66 | HR 52 | Temp 98.9°F | Resp 17 | Ht 67.0 in | Wt 178.2 lb

## 2019-03-12 VITALS — BP 180/66

## 2019-03-12 DIAGNOSIS — C184 Malignant neoplasm of transverse colon: Secondary | ICD-10-CM | POA: Insufficient documentation

## 2019-03-12 DIAGNOSIS — Z452 Encounter for adjustment and management of vascular access device: Secondary | ICD-10-CM | POA: Diagnosis not present

## 2019-03-12 DIAGNOSIS — Z7189 Other specified counseling: Secondary | ICD-10-CM

## 2019-03-12 DIAGNOSIS — Z433 Encounter for attention to colostomy: Secondary | ICD-10-CM | POA: Diagnosis not present

## 2019-03-12 DIAGNOSIS — Z5111 Encounter for antineoplastic chemotherapy: Secondary | ICD-10-CM | POA: Insufficient documentation

## 2019-03-12 DIAGNOSIS — C787 Secondary malignant neoplasm of liver and intrahepatic bile duct: Secondary | ICD-10-CM

## 2019-03-12 DIAGNOSIS — R739 Hyperglycemia, unspecified: Secondary | ICD-10-CM | POA: Diagnosis not present

## 2019-03-12 DIAGNOSIS — C189 Malignant neoplasm of colon, unspecified: Secondary | ICD-10-CM

## 2019-03-12 DIAGNOSIS — Z95828 Presence of other vascular implants and grafts: Secondary | ICD-10-CM | POA: Insufficient documentation

## 2019-03-12 DIAGNOSIS — Z483 Aftercare following surgery for neoplasm: Secondary | ICD-10-CM | POA: Diagnosis not present

## 2019-03-12 DIAGNOSIS — I10 Essential (primary) hypertension: Secondary | ICD-10-CM | POA: Diagnosis not present

## 2019-03-12 DIAGNOSIS — K7689 Other specified diseases of liver: Secondary | ICD-10-CM | POA: Diagnosis not present

## 2019-03-12 LAB — CBC WITH DIFFERENTIAL (CANCER CENTER ONLY)
Abs Immature Granulocytes: 0.01 10*3/uL (ref 0.00–0.07)
Basophils Absolute: 0.1 10*3/uL (ref 0.0–0.1)
Basophils Relative: 1 %
Eosinophils Absolute: 0.2 10*3/uL (ref 0.0–0.5)
Eosinophils Relative: 4 %
HCT: 28.7 % — ABNORMAL LOW (ref 36.0–46.0)
Hemoglobin: 9 g/dL — ABNORMAL LOW (ref 12.0–15.0)
Immature Granulocytes: 0 %
Lymphocytes Relative: 25 %
Lymphs Abs: 1.6 10*3/uL (ref 0.7–4.0)
MCH: 24.2 pg — ABNORMAL LOW (ref 26.0–34.0)
MCHC: 31.4 g/dL (ref 30.0–36.0)
MCV: 77.2 fL — ABNORMAL LOW (ref 80.0–100.0)
Monocytes Absolute: 0.6 10*3/uL (ref 0.1–1.0)
Monocytes Relative: 9 %
Neutro Abs: 4 10*3/uL (ref 1.7–7.7)
Neutrophils Relative %: 61 %
Platelet Count: 285 10*3/uL (ref 150–400)
RBC: 3.72 MIL/uL — ABNORMAL LOW (ref 3.87–5.11)
RDW: 22.4 % — ABNORMAL HIGH (ref 11.5–15.5)
WBC Count: 6.6 10*3/uL (ref 4.0–10.5)
nRBC: 0 % (ref 0.0–0.2)

## 2019-03-12 LAB — CMP (CANCER CENTER ONLY)
ALT: 14 U/L (ref 0–44)
AST: 20 U/L (ref 15–41)
Albumin: 3.3 g/dL — ABNORMAL LOW (ref 3.5–5.0)
Alkaline Phosphatase: 93 U/L (ref 38–126)
Anion gap: 7 (ref 5–15)
BUN: 14 mg/dL (ref 6–20)
CO2: 25 mmol/L (ref 22–32)
Calcium: 9.6 mg/dL (ref 8.9–10.3)
Chloride: 108 mmol/L (ref 98–111)
Creatinine: 0.99 mg/dL (ref 0.44–1.00)
GFR, Est AFR Am: >60 mL/min (ref >60)
GFR, Estimated: >60 mL/min (ref >60)
Glucose, Bld: 101 mg/dL — ABNORMAL HIGH (ref 70–99)
Potassium: 4.3 mmol/L (ref 3.5–5.1)
Sodium: 140 mmol/L (ref 135–145)
Total Bilirubin: 0.3 mg/dL (ref 0.3–1.2)
Total Protein: 7 g/dL (ref 6.5–8.1)

## 2019-03-12 MED ORDER — PALONOSETRON HCL INJECTION 0.25 MG/5ML
INTRAVENOUS | Status: AC
  Filled 2019-03-12: qty 5

## 2019-03-12 MED ORDER — LEUCOVORIN CALCIUM INJECTION 350 MG
400.0000 mg/m2 | Freq: Once | INTRAVENOUS | Status: AC
  Administered 2019-03-12: 808 mg via INTRAVENOUS
  Filled 2019-03-12: qty 40.4

## 2019-03-12 MED ORDER — PALONOSETRON HCL INJECTION 0.25 MG/5ML
0.2500 mg | Freq: Once | INTRAVENOUS | Status: AC
  Administered 2019-03-12: 10:00:00 0.25 mg via INTRAVENOUS

## 2019-03-12 MED ORDER — DEXAMETHASONE SODIUM PHOSPHATE 10 MG/ML IJ SOLN
INTRAMUSCULAR | Status: AC
  Filled 2019-03-12: qty 1

## 2019-03-12 MED ORDER — DEXAMETHASONE SODIUM PHOSPHATE 10 MG/ML IJ SOLN
10.0000 mg | Freq: Once | INTRAMUSCULAR | Status: AC
  Administered 2019-03-12: 10 mg via INTRAVENOUS

## 2019-03-12 MED ORDER — SODIUM CHLORIDE 0.9 % IV SOLN: 10.0000 mg | Freq: Once | INTRAVENOUS | Status: DC

## 2019-03-12 MED ORDER — SODIUM CHLORIDE 0.9% FLUSH
10.0000 mL | INTRAVENOUS | Status: DC | PRN
  Administered 2019-03-12: 08:00:00 10 mL
  Filled 2019-03-12: qty 10

## 2019-03-12 MED ORDER — SODIUM CHLORIDE 0.9 % IV SOLN
2470.0000 mg/m2 | INTRAVENOUS | Status: DC
  Administered 2019-03-12: 5000 mg via INTRAVENOUS
  Filled 2019-03-12: qty 100

## 2019-03-12 MED ORDER — SODIUM CHLORIDE 0.9 % IV SOLN
INTRAVENOUS | Status: DC
  Administered 2019-03-12: 09:00:00 via INTRAVENOUS
  Filled 2019-03-12: qty 250

## 2019-03-12 NOTE — Progress Notes (Signed)
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 Bowel-gas pattern consistent with small bowel obstruction. No free air. 2. No acute chest findings.   01/23/2019 Imaging   CT AP IMPRESSION: Obstructing mid transverse colonic mass with mild regional adenopathy and hepatic metastatic disease. The mass likely extends through the serosa; no ascites or peritoneal nodularity.   01/24/2019 Surgery   Surgeon: Clovis Riley MD Assistant: [name] Procedure performed: Transverse colectomy with end colostomy, liver biopsy Procedure classification: URGENT/EMERGENT Preop diagnosis: Obstructing, metastatic transverse colon mass Post-op diagnosis/intraop findings: Same   01/24/2019 Pathology Results   FINAL MICROSCOPIC DIAGNOSIS:   A. COLON, TRANSVERSE, RESECTION:  Colonic adenocarcinoma, 5 cm.  Carcinoma extends into pericolonic connective tissue and focally to  serosal surface.  Margins not involved.  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   B. LIVER NODULE, LEFT, BIOPSY:  Metastatic adenocarcinoma.    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 Hypermetabolic metastatic disease in the liver and mediastinal/hilar/axillary lymph nodes. 2. Focal hypermetabolism in the rectum. Continued attention on follow-up exams is warranted. 3. Focal hypermetabolism medial to the right adrenal gland may be within a metastatic lymph node, better visualized on 01/23/2019. 4. Aortic atherosclerosis (ICD10-170.0). Coronary artery calcification.   03/12/2019 -  Chemotherapy   FOLFOX q2weeks starting 03/12/19     CURRENT THERAPY: First line FOLFOX starting 03/12/19, holding avastin for wound healing   INTERVAL HISTORY: [name] returns for f/u and treatment as scheduled. She is feeling well. PAC placement was uncomplicated. Her abdominal wound is healing, gets dressing changes per wound care on MWF. She anticipates the wound vac will be removed soon, wound is very shallow. Using less oxycodone for dressing changes. Bowels moving normally, empties colostomy twice per day. Denies n/v. She has good appetite but still losing weight. She is active at home, out of bed and moving around well. Denies fever, chills, cough, chest pain, dyspnea, or leg swelling.     MEDICAL HISTORY:  Past Medical History:  Diagnosis Date  . Anemia    low iron  . Cancer [facility])    colon cancer  . Hypertension 01/23/2019  . SBO (small bowel obstruction) ([place]) 01/23/2019    SURGICAL HISTORY: Past Surgical History:  Procedure Laterality Date  . CESAREAN SECTION     x2  . COLOSTOMY N/A 01/24/2019   Procedure: End Loop Colostomy;  Surgeon: Clovis Riley, MD;  Location: [place];  Service: General;  Laterality: N/A;  . PARTIAL COLECTOMY N/A 01/24/2019  Procedure: PARTIAL COLECTOMY;  Surgeon: Clovis Riley, MD;  Location: [place];  Service: General;  Laterality: N/A;  .  PORTACATH PLACEMENT Right 02/28/2019   Procedure: INSERTION PORT-A-CATH WITH ULTRASOUND GUIDANCE;  Surgeon: Clovis Riley, MD;  Location: [place];  Service: General;  Laterality: Right;    I have reviewed the social history and family history with the patient and they are unchanged from previous note.  ALLERGIES:  has No Known Allergies.  MEDICATIONS:  Current Outpatient Medications  Medication Sig Dispense Refill  . acetaminophen (TYLENOL) 325 MG tablet Take 2 tablets (650 mg total) by mouth every 6 (six) hours as needed.    . ferrous sulfate 325 (65 FE) MG tablet Take 1 tablet (325 mg total) by mouth 2 (two) times daily with a meal. 60 tablet 3  . hydrALAZINE (APRESOLINE) 25 MG tablet Take 1 tablet (25 mg total) by mouth every 8 (eight) hours. 90 tablet 0  . Ibuprofen (ADVIL) 200 MG CAPS Take 400 mg by mouth daily as needed (pain).    . isosorbide mononitrate (IMDUR) 30 MG 24 hr tablet Take 1 tablet (30 mg total) by mouth daily. 30 tablet 0  . lidocaine-prilocaine (EMLA) cream Apply to affected area once (Patient taking differently: Apply 1 application topically once. Apply to affected area once) 30 g 3  . lisinopril (ZESTRIL) 40 MG tablet Take 1 tablet (40 mg total) by mouth daily. 30 tablet 0  . loperamide (IMODIUM) 2 MG capsule Take 1 capsule (2 mg total) by mouth as needed for diarrhea or loose stools. 90 capsule 0  . metoprolol tartrate (LOPRESSOR) 50 MG tablet Take 1 tablet (50 mg total) by mouth 2 (two) times daily. 60 tablet 0  . nicotine (NICODERM CQ - DOSED IN MG/24 HR) 7 mg/24hr patch Place 1 patch (7 mg total) onto the skin daily. 7 patch 0  . ondansetron (ZOFRAN) 4 MG tablet Take 1 tablet (4 mg total) by mouth every 6 (six) hours as needed for nausea. 30 tablet 0  . ondansetron (ZOFRAN) 8 MG tablet Take 1 tablet (8 mg total) by mouth 2 (two) times daily as needed for refractory nausea / vomiting. Start on day 3 after chemotherapy. 30 tablet 1  . oxyCODONE 10 MG TABS Take 0.5-1  tablets (5-10 mg total) by mouth every 4 (four) hours as needed for moderate pain or severe pain (5 moderate, 10 severe). 20 tablet 0  . polycarbophil (FIBERCON) 625 MG tablet Take 1 tablet (625 mg total) by mouth daily. 30 tablet 0  . prochlorperazine (COMPAZINE) 10 MG tablet Take 1 tablet (10 mg total) by mouth every 6 (six) hours as needed (Nausea or vomiting). 30 tablet 1   No current facility-administered medications for this visit.    Facility-Administered Medications Ordered in Other Visits  Medication Dose Route Frequency Provider Last Rate Last Dose  . 0.9 %  sodium chloride infusion   Intravenous Continuous Truitt Merle, MD 20 mL/hr at 03/12/19 [phone]    . fluorouracil (ADRUCIL) 5,000 mg in sodium chloride 0.9 % 150 mL chemo infusion  2,470 mg/m2 (Treatment Plan Recorded) Intravenous 1 day or 1 dose Truitt Merle, MD      . leucovorin 808 mg in dextrose 5 % 250 mL infusion  400 mg/m2 (Treatment Plan Recorded) Intravenous Once Truitt Merle, MD 581 mL/hr at 03/12/19 1010 808 mg at 03/12/19 1010    PHYSICAL EXAMINATION: ECOG PERFORMANCE STATUS: 1 - Symptomatic but completely ambulatory  Vitals:   03/12/19 1950  BP: (!) 194/66  Pulse: (!) 52  Resp: 17  Temp: 98.9 F (37.2 C)  SpO2: 100%   Filed Weights   03/12/19 0828  Weight: 178 lb 3.2 oz (80.8 kg)    GENERAL:alert, no distress and comfortable SKIN: no rash  EYES:  sclera clear NECK: PAC incision healing well  LUNGS: clear with normal breathing effort HEART: regular rate & rhythm, no lower extremity edema ABDOMEN: abdomen soft, non-tender and normal bowel sounds. Colostomy in place. Wound vac to central abd wound, smaller now  NEURO: alert & oriented x 3 with fluent speech PAC without erythema  LABORATORY DATA:  I have reviewed the data as listed CBC Latest Ref Rng & Units 03/12/2019 02/23/2019 02/03/2019  WBC 4.0 - 10.5 K/uL 6.6 6.9 11.5(H)  Hemoglobin 12.0 - 15.0 g/dL 9.0(L) 8.9(L) 8.9(L)  Hematocrit 36.0 - 46.0 % 28.7(L)  28.8(L) 28.3(L)  Platelets 150 - 400 K/uL 285 214 342     CMP Latest Ref Rng & Units 03/12/2019 02/23/2019 02/03/2019  Glucose 70 - 99 mg/dL 101(H) 94 103(H)  BUN 6 - 20 mg/dL _0 Creatinine 0.44 - 1.00 mg/dL 0.99 0.88 0.77  Sodium 135 - 145 mmol/L 140 137 134(L)  Potassium 3.5 - 5.1 mmol/L 4.3 4.7 4.2  Chloride 98 - 111 mmol/L 108 102 102  CO2 22 - 32 mmol/L 25 27 20(L)  Calcium 8.9 - 10.3 mg/dL 9.6 9.4 8.4(L)  Total Protein 6.5 - 8.1 g/dL 7.0 7.1 -  Total Bilirubin 0.3 - 1.2 mg/dL 0.3 0.4 -  Alkaline Phos 38 - 126 U/L 93 92 -  AST 15 - 41 U/L 20 14(L) -  ALT 0 - 44 U/L 14 11 -      RADIOGRAPHIC STUDIES: I have personally reviewed the radiological images as listed and agreed with the findings in the report. No results found.   ASSESSMENT & PLAN: ALEXCIS BICKING is a 58 y.o. female with   1. 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  -PET from 02/26/19 shows known liver metastasis and metastatic lymphadenopathy in chest and right axilla.  -Unfortunately, her cancer is stage IV and no longer eligible for surgery. The likelihood of curing this is very low due to her diffuse metastasis, but still very treatable.  -Dr. Burr Medico recommended first line FOLFOX and avastin. Will hold Avastin for first few cycles due to her wound issue. Goal is palliative. She has given consent.  -Ms. House appears well today. Her wound continues to heal well, wound vac still in place but likely will be dc'd soon. She continues wound care and PT at home  -labs adequate to begin treatment -She will proceed with starting chemo, will start with less intensive leucovorin and 5FU only today due to wound healing. -We reviewed potential chemo toxicities and symptom management -f/u in 2 weeks  with next cycle   2. Anorexia  -she reportedly weighed 253 lbs a few years ago, and weighed 203 lbs at symptom onset~11/2018 -190 lbs in 02/2019.  -Continue to f/u with dietician  -appetite and energy level are stable, but still losing weight. Followed by dietician. I recommend to drink 1-2 ensure per day to maintain her weight on chemo. She agrees   3. Abdominal wound -She had a large open midline incision after surgery, closed with wound vac.  -  Wound care at home MWF for dressing changes, will continue and f/u with Dr Kae Heller. She anticipates wound vac will be dc'd soon, wound is much smaller -takes occasional oxycodone before dressing change, otherwise denies abd pain  4. 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 Continue   5. HTN -on hydralazine, isosorbide, lisinopril, metoprolol. Continue medications and f/u with PCP.  -BP is elevated today likely due to nervousness about starting chemo, PAC access, etc.   6. Social support -She worked in [employer] at 2 different jobs, but has been out of work lately due to her symptoms  -She is not getting disability, no pay, but her employer kept her on their insurance plan -has been referred to SW to discuss financial option/resources   7. Goals of care discussion -we again discussed goal is palliative, to control disease and prolong her life. She understands her cancer is not curable at this stage, and that we will continue treatment as long as she can tolerate and her disease is controlled.  -full code    PLAN: -Labs reviewed -Begin chemo, less intensive leucovorin and 5FU only today due to wound healing -Reviewed chemo toxicities and symptom management -Begin ensure 1-2 times daily, continue f/u with nutrition  -F/u in 2 weeks with next cycle -Continue wound care MWF and PT on wednesdays at home  All questions were answered.  The patient knows to call the clinic with any problems, questions or concerns. No barriers to learning was detected. I spent 20 minutes counseling the patient face to face. The total time spent in the appointment was 25 minutes and more than 50% was on counseling and review of test results     [name], NP 03/12/19

## 2019-03-12 NOTE — Patient Instructions (Signed)
[facility] Discharge Instructions for Patients Receiving Chemotherapy  Today you received the following chemotherapy agents: Leucovorin, 5FU  To help prevent nausea and vomiting after your treatment, we encourage you to take your nausea medication as directed.   If you develop nausea and vomiting that is not controlled by your nausea medication, call the clinic.   BELOW ARE SYMPTOMS THAT SHOULD BE REPORTED IMMEDIATELY:  *FEVER GREATER THAN 100.5 F  *CHILLS WITH OR WITHOUT FEVER  NAUSEA AND VOMITING THAT IS NOT CONTROLLED WITH YOUR NAUSEA MEDICATION  *UNUSUAL SHORTNESS OF BREATH  *UNUSUAL BRUISING OR BLEEDING  TENDERNESS IN MOUTH AND THROAT WITH OR WITHOUT PRESENCE OF ULCERS  *URINARY PROBLEMS  *BOWEL PROBLEMS  UNUSUAL RASH Items with * indicate a potential emergency and should be followed up as soon as possible.  Feel free to call the clinic should you have any questions or concerns. The clinic phone number is [phone].  Please show the [place] at check-in to the Emergency Department and triage nurse.  Leucovorin injection What is this medicine? LEUCOVORIN (loo koe VOR in) is used to prevent or treat the harmful effects of some medicines. This medicine is used to treat anemia caused by a low amount of folic acid in the body. It is also used with 5-fluorouracil (5-FU) to treat colon cancer. This medicine may be used for other purposes; ask your health care provider or pharmacist if you have questions. What should I tell my health care provider before I take this medicine? They need to know if you have any of these conditions:  anemia from low levels of vitamin B-12 in the blood  an unusual or allergic reaction to leucovorin, folic acid, other medicines, foods, dyes, or preservatives  pregnant or trying to get pregnant  breast-feeding How should I use this medicine? This medicine is for injection into a muscle or into a vein. It is given by  a health care professional in a hospital or clinic setting. Talk to your pediatrician regarding the use of this medicine in children. Special care may be needed. Overdosage: If you think you have taken too much of this medicine contact a poison control center or emergency room at once. NOTE: This medicine is only for you. Do not share this medicine with others. What if I miss a dose? This does not apply. What may interact with this medicine?  capecitabine  fluorouracil  phenobarbital  phenytoin  primidone  trimethoprim-sulfamethoxazole This list may not describe all possible interactions. Give your health care provider a list of all the medicines, herbs, non-prescription drugs, or dietary supplements you use. Also tell them if you smoke, drink alcohol, or use illegal drugs. Some items may interact with your medicine. What should I watch for while using this medicine? Your condition will be monitored carefully while you are receiving this medicine. This medicine may increase the side effects of 5-fluorouracil, 5-FU. Tell your doctor or health care professional if you have diarrhea or mouth sores that do not get better or that get worse. What side effects may I notice from receiving this medicine? Side effects that you should report to your doctor or health care professional as soon as possible:  allergic reactions like skin rash, itching or hives, swelling of the face, lips, or tongue  breathing problems  fever, infection  mouth sores  unusual bleeding or bruising  unusually weak or tired Side effects that usually do not require medical attention (report to your doctor or health  care professional if they continue or are bothersome):  constipation or diarrhea  loss of appetite  nausea, vomiting This list may not describe all possible side effects. Call your doctor for medical advice about side effects. You may report side effects to FDA at 1-800-FDA-1088. Where should I  keep my medicine? This drug is given in a hospital or clinic and will not be stored at home. NOTE: This sheet is a summary. It may not cover all possible information. If you have questions about this medicine, talk to your doctor, pharmacist, or health care provider.  2020 Elsevier/Gold Standard (2007-09-26 16:50:29)   Fluorouracil, 5FU; Diclofenac topical cream What is this medicine? FLUOROURACIL; DICLOFENAC (flure oh YOOR a sil; dye KLOE fen ak) is a combination of a topical chemotherapy agent and non-steroidal anti-inflammatory drug (NSAID). It is used on the skin to treat skin cancer and skin conditions that could become cancer. This medicine may be used for other purposes; ask your health care provider or pharmacist if you have questions. COMMON BRAND NAME(S): FLUORAC What should I tell my health care provider before I take this medicine? They need to know if you have any of these conditions:  bleeding problems  cigarette smoker  DPD enzyme deficiency  heart disease  high blood pressure  if you frequently drink alcohol containing drinks  kidney disease  liver disease  open or infected skin  stomach problems  swelling or open sores at the treatment site  recent or planned coronary artery bypass graft (CABG) surgery  an unusual or allergic reaction to fluorouracil, diclofenac, aspirin, other NSAIDs, other medicines, foods, dyes, or preservatives  pregnant or trying to get pregnant  breast-feeding How should I use this medicine? This medicine is only for use on the skin. Follow the directions on the prescription label. Wash hands before and after use. Wash affected area and gently pat dry. To apply this medicine use a cotton-tipped applicator, or use gloves if applying with fingertips. If applied with unprotected fingertips, it is very important to wash your hands well after you apply this medicine. Avoid applying to the eyes, nose, or mouth. Apply enough medicine to  cover the affected area. You can cover the area with a light gauze dressing, but do not use tight or air-tight dressings. Finish the full course prescribed by your doctor or health care professional, even if you think your condition is better. Do not stop taking except on the advice of your doctor or health care professional. Talk to your pediatrician regarding the use of this medicine in children. Special care may be needed. Overdosage: If you think you have taken too much of this medicine contact a poison control center or emergency room at once. NOTE: This medicine is only for you. Do not share this medicine with others. What if I miss a dose? If you miss a dose, apply it as soon as you can. If it is almost time for your next dose, only use that dose. Do not apply extra doses. Contact your doctor or health care professional if you miss more than one dose. What may interact with this medicine? Interactions are not expected. Do not use any other skin products without telling your doctor or health care professional. This list may not describe all possible interactions. Give your health care provider a list of all the medicines, herbs, non-prescription drugs, or dietary supplements you use. Also tell them if you smoke, drink alcohol, or use illegal drugs. Some items may interact with your  medicine. What should I watch for while using this medicine? Visit your doctor or healthcare provider for checks on your progress. You will need to use this medicine for 2 to 6 weeks. This may be longer depending on the condition being treated. You may not see full healing for another 1 to 2 months after you stop using the medicine. This medicine may cause serious skin reactions. They can happen weeks to months after starting the medicine. Contact your healthcare provider right away if you notice fevers or flu-like symptoms with a rash. The rash may be red or purple and then turn into blisters or peeling of the skin. Or,  you might notice a red rash with swelling of the face, lips or lymph nodes in your neck or under your arms. Treated areas of skin can look unsightly during and for several weeks after treatment with this medicine. This medicine can make you more sensitive to the sun. Keep out of the sun. If you cannot avoid being in the sun, wear protective clothing and use sunscreen. Do not use sun lamps or tanning beds/booths. If a pet comes in contact with the area where this medicine was applied to your skin or if it is ingested, they may have a serious risk of side effects. If accidental contact happens, the skin of the pet should be washed right away with soap and water. Contact your vet right away if your pet becomes exposed. Do not become pregnant while taking this medicine. Women should inform their doctor if they wish to become pregnant or think they might be pregnant. There is a potential for serious side effects to an unborn child. Talk to your healthcare provider or pharmacist for more information. What side effects may I notice from receiving this medicine? Side effects that you should report to your doctor or health care professional as soon as possible:  allergic reactions like skin rash, itching or hives, swelling of the face, lips, or tongue  black or bloody stools, blood in the urine or vomit  blurred vision  chest pain  difficulty breathing or wheezing  rash, fever, and swollen lymph nodes  redness, blistering, peeling or loosening of the skin, including inside the mouth  severe redness and swelling of normal skin  slurred speech or weakness on one side of the body  trouble passing urine or change in the amount of urine  unexplained weight gain or swelling  unusually weak or tired  yellowing of eyes or skin Side effects that usually do not require medical attention (report to your doctor or health care professional if they continue or are bothersome):  increased sensitivity of  the skin to sun and ultraviolet light  pain and burning of the affected area  scaling or swelling of the affected area  skin rash, itching of the affected area  tenderness This list may not describe all possible side effects. Call your doctor for medical advice about side effects. You may report side effects to FDA at 1-800-FDA-1088. Where should I keep my medicine? Keep out of the reach of children and pets. Store at room temperature between 20 and 25 degrees C (68 and 77 degrees F). Throw away any unused medicine after the expiration date. NOTE: This sheet is a summary. It may not cover all possible information. If you have questions about this medicine, talk to your doctor, pharmacist, or health care provider.  2020 Elsevier/Gold Standard (2018-06-07 13:31:57)

## 2019-03-13 ENCOUNTER — Encounter: Payer: Self-pay | Admitting: Internal Medicine

## 2019-03-13 ENCOUNTER — Telehealth: Payer: Self-pay | Admitting: Nurse Practitioner

## 2019-03-13 ENCOUNTER — Ambulatory Visit (INDEPENDENT_AMBULATORY_CARE_PROVIDER_SITE_OTHER): Payer: BC Managed Care – PPO | Admitting: Internal Medicine

## 2019-03-13 VITALS — BP 124/70 | HR 60 | Temp 97.1°F | Wt 181.4 lb

## 2019-03-13 DIAGNOSIS — F17200 Nicotine dependence, unspecified, uncomplicated: Secondary | ICD-10-CM

## 2019-03-13 DIAGNOSIS — I10 Essential (primary) hypertension: Secondary | ICD-10-CM

## 2019-03-13 DIAGNOSIS — C787 Secondary malignant neoplasm of liver and intrahepatic bile duct: Secondary | ICD-10-CM | POA: Diagnosis not present

## 2019-03-13 DIAGNOSIS — C189 Malignant neoplasm of colon, unspecified: Secondary | ICD-10-CM

## 2019-03-13 NOTE — Telephone Encounter (Signed)
Scheduled appt per 1/27 los 

## 2019-03-13 NOTE — Patient Instructions (Signed)
-  Nice seeing you today!!  -Schedule follow up in 3 months for your physical. Please come in fasting that day. 

## 2019-03-13 NOTE — Progress Notes (Signed)
New Patient Office Visit     This visit occurred during the SARS-CoV-2 public health emergency.  Safety protocols were in place, including screening questions prior to the visit, additional usage of staff PPE, and extensive cleaning of exam room while observing appropriate contact time as indicated for disinfecting solutions.    CC/Reason for Visit: Establish care, discuss chronic conditions Previous PCP: None Last Visit: Unknown  HPI: Casey Black is a 58 y.o. female who is coming in today for the above mentioned reasons. 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  She had colectomy and colostomy that admission.  She now has a wound VAC in place with home health nurse managing every other day at home.  Her surgeon is Dr. Kae Heller.  She also has a Port-A-Cath.  She is also seeing Dr. Annamaria Boots with oncology and has started her FOLFOX regimen as of yesterday.  She currently has her pump on and infusing.  Avastin has been placed on hold as to allow for healing of abdominal wounds.  Other than that she has a history of hypertension that is well controlled on a multidrug regimen including hydralazine 25 mg 3 times a day, isosorbide mononitrate 30 mg daily, lisinopril 40 mg daily, metoprolol 50 mg twice daily.  She used to be a heavy smoker of over 22 years but quit after her diagnosis of colon cancer.  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   Past Medical/Surgical History: Past Medical History:  Diagnosis Date  . Anemia    low iron  . Cancer [facility])    colon  cancer  . Hypertension 01/23/2019  . SBO (small bowel obstruction) ([place]) 01/23/2019    Past Surgical History:  Procedure Laterality Date  . CESAREAN SECTION     x2  . COLOSTOMY N/A 01/24/2019   Procedure: End Loop Colostomy;  Surgeon: Clovis Riley, MD;  Location: [place];  Service: General;  Laterality: N/A;  . PARTIAL COLECTOMY N/A 01/24/2019   Procedure: PARTIAL COLECTOMY;  Surgeon: Clovis Riley, MD;  Location: [place];  Service: General;  Laterality: N/A;  . PORTACATH PLACEMENT Right 02/28/2019   Procedure: INSERTION PORT-A-CATH WITH ULTRASOUND GUIDANCE;  Surgeon: Clovis Riley, MD;  Location: [place];  Service: General;  Laterality: Right;    Social History:  reports that she quit smoking about 2 months ago. Her smoking use included cigarettes. She has a 14.50 pack-year smoking history. She has never used smokeless tobacco. She reports previous alcohol use. She reports that she does not use drugs.  Allergies: No Known Allergies  Family History:  Family History  Problem Relation Age of Onset  . Kidney failure Father   . Hypertension Father   . Hypertension Sister      Current Outpatient Medications:  .  acetaminophen (TYLENOL) 325 MG tablet, Take 2 tablets (650 mg total) by mouth every 6 (six) hours as needed., Disp: , Rfl:  .  ferrous sulfate 325 (65 FE) MG tablet, Take 1 tablet (325 mg total) by mouth 2 (two) times daily  with a meal., Disp: 60 tablet, Rfl: 3 .  hydrALAZINE (APRESOLINE) 25 MG tablet, Take 1 tablet (25 mg total) by mouth every 8 (eight) hours., Disp: 90 tablet, Rfl: 0 .  Ibuprofen (ADVIL) 200 MG CAPS, Take 400 mg by mouth daily as needed (pain)., Disp: , Rfl:  .  isosorbide mononitrate (IMDUR) 30 MG 24 hr tablet, Take 1 tablet (30 mg total) by mouth daily., Disp: 30 tablet, Rfl: 0 .  lidocaine-prilocaine (EMLA) cream, Apply to affected area once (Patient taking differently: Apply 1 application topically once. Apply to affected area once), Disp: 30 g,  Rfl: 3 .  lisinopril (ZESTRIL) 40 MG tablet, Take 1 tablet (40 mg total) by mouth daily., Disp: 30 tablet, Rfl: 0 .  loperamide (IMODIUM) 2 MG capsule, Take 1 capsule (2 mg total) by mouth as needed for diarrhea or loose stools., Disp: 90 capsule, Rfl: 0 .  metoprolol tartrate (LOPRESSOR) 50 MG tablet, Take 1 tablet (50 mg total) by mouth 2 (two) times daily., Disp: 60 tablet, Rfl: 0 .  nicotine (NICODERM CQ - DOSED IN MG/24 HR) 7 mg/24hr patch, Place 1 patch (7 mg total) onto the skin daily., Disp: 7 patch, Rfl: 0 .  ondansetron (ZOFRAN) 4 MG tablet, Take 1 tablet (4 mg total) by mouth every 6 (six) hours as needed for nausea., Disp: 30 tablet, Rfl: 0 .  ondansetron (ZOFRAN) 8 MG tablet, Take 1 tablet (8 mg total) by mouth 2 (two) times daily as needed for refractory nausea / vomiting. Start on day 3 after chemotherapy., Disp: 30 tablet, Rfl: 1 .  oxyCODONE 10 MG TABS, Take 0.5-1 tablets (5-10 mg total) by mouth every 4 (four) hours as needed for moderate pain or severe pain (5 moderate, 10 severe)., Disp: 20 tablet, Rfl: 0 .  polycarbophil (FIBERCON) 625 MG tablet, Take 1 tablet (625 mg total) by mouth daily., Disp: 30 tablet, Rfl: 0 .  prochlorperazine (COMPAZINE) 10 MG tablet, Take 1 tablet (10 mg total) by mouth every 6 (six) hours as needed (Nausea or vomiting)., Disp: 30 tablet, Rfl: 1  Review of Systems:  Constitutional: Denies fever, chills, diaphoresis, appetite change and fatigue.  HEENT: Denies photophobia, eye pain, redness, hearing loss, ear pain, congestion, sore throat, rhinorrhea, sneezing, mouth sores, trouble swallowing, neck pain, neck stiffness and tinnitus.   Respiratory: Denies SOB, DOE, cough, chest tightness,  and wheezing.   Cardiovascular: Denies chest pain, palpitations and leg swelling.  Gastrointestinal: Denies nausea, vomiting, abdominal pain, diarrhea, constipation, blood in stool and abdominal distention.  Genitourinary: Denies dysuria, urgency, frequency, hematuria,  flank pain and difficulty urinating.  Endocrine: Denies: hot or cold intolerance, sweats, changes in hair or nails, polyuria, polydipsia. Musculoskeletal: Denies myalgias, back pain, joint swelling, arthralgias and gait problem.  Skin: Denies pallor, rash and wound.  Neurological: Denies dizziness, seizures, syncope, weakness, light-headedness, numbness and headaches.  Hematological: Denies adenopathy. Easy bruising, personal or family bleeding history  Psychiatric/Behavioral: Denies suicidal ideation, mood changes, confusion, nervousness, sleep disturbance and agitation    Physical Exam: Vitals:   03/13/19 1338  BP: 124/70  Pulse: 60  Temp: (!) 97.1 F (36.2 C)  TempSrc: Temporal  SpO2: 98%  Weight: 181 lb 6.4 oz (82.3 kg)   Body mass index is 28.41 kg/m.  Constitutional: NAD, calm, comfortable Eyes: PERRL, lids and conjunctivae normal ENMT: Mucous membranes are moist.  Respiratory: clear to auscultation bilaterally, no wheezing, no crackles. Normal respiratory effort. No accessory muscle use.  Cardiovascular: Regular rate and rhythm, no murmurs /  rubs / gallops. No extremity edema. 2+ pedal pulses.  Port-A-Cath in place with current chemotherapy infusing. Abdomen: no tenderness, no masses palpated. No hepatosplenomegaly. Bowel sounds positive.  Wound VAC is on Musculoskeletal: no clubbing / cyanosis. No joint deformity upper and lower extremities. Good ROM, no contractures. Normal muscle tone.  Skin: no rashes, lesions, ulcers. No induration Neurologic: Grossly intact and nonfocal Psychiatric: Normal judgment and insight. Alert and oriented x 3. Normal mood.    Impression and Plan:  Adenocarcinoma of colon metastatic to liver [facility]) -Status post colectomy and colostomy in October, wound VAC is in place. -Currently on FOLFOX regimen followed by oncology.  Essential hypertension -Well-controlled on current regimen.  Tobacco dependence -Quit as of 2 months ago.  She will  return in 3 months for a physical.     Patient Instructions  -Nice seeing you today!!  -Schedule follow up in 3 months for your physical. Please come in fasting that day.     Lelon Frohlich, MD [facility]

## 2019-03-14 ENCOUNTER — Inpatient Hospital Stay: Payer: BC Managed Care – PPO

## 2019-03-14 ENCOUNTER — Other Ambulatory Visit: Payer: Self-pay

## 2019-03-14 VITALS — BP 128/72 | HR 62 | Temp 98.1°F | Resp 18

## 2019-03-14 DIAGNOSIS — K7689 Other specified diseases of liver: Secondary | ICD-10-CM | POA: Diagnosis not present

## 2019-03-14 DIAGNOSIS — Z7189 Other specified counseling: Secondary | ICD-10-CM

## 2019-03-14 DIAGNOSIS — Z452 Encounter for adjustment and management of vascular access device: Secondary | ICD-10-CM | POA: Diagnosis not present

## 2019-03-14 DIAGNOSIS — I10 Essential (primary) hypertension: Secondary | ICD-10-CM | POA: Diagnosis not present

## 2019-03-14 DIAGNOSIS — Z5111 Encounter for antineoplastic chemotherapy: Secondary | ICD-10-CM | POA: Diagnosis not present

## 2019-03-14 DIAGNOSIS — C787 Secondary malignant neoplasm of liver and intrahepatic bile duct: Secondary | ICD-10-CM | POA: Diagnosis not present

## 2019-03-14 DIAGNOSIS — Z433 Encounter for attention to colostomy: Secondary | ICD-10-CM | POA: Diagnosis not present

## 2019-03-14 DIAGNOSIS — C189 Malignant neoplasm of colon, unspecified: Secondary | ICD-10-CM

## 2019-03-14 DIAGNOSIS — C184 Malignant neoplasm of transverse colon: Secondary | ICD-10-CM | POA: Diagnosis not present

## 2019-03-14 DIAGNOSIS — Z483 Aftercare following surgery for neoplasm: Secondary | ICD-10-CM | POA: Diagnosis not present

## 2019-03-14 DIAGNOSIS — R739 Hyperglycemia, unspecified: Secondary | ICD-10-CM | POA: Diagnosis not present

## 2019-03-14 MED ORDER — SODIUM CHLORIDE 0.9% FLUSH
10.0000 mL | INTRAVENOUS | Status: DC | PRN
  Administered 2019-03-14: 10 mL
  Filled 2019-03-14: qty 10

## 2019-03-14 MED ORDER — HEPARIN SOD (PORK) LOCK FLUSH 100 UNIT/ML IV SOLN
500.0000 [IU] | Freq: Once | INTRAVENOUS | Status: AC | PRN
  Administered 2019-03-14: 500 [IU]
  Filled 2019-03-14: qty 5

## 2019-03-16 DIAGNOSIS — K56609 Unspecified intestinal obstruction, unspecified as to partial versus complete obstruction: Secondary | ICD-10-CM | POA: Diagnosis not present

## 2019-03-16 DIAGNOSIS — Z933 Colostomy status: Secondary | ICD-10-CM | POA: Diagnosis not present

## 2019-03-19 DIAGNOSIS — Z933 Colostomy status: Secondary | ICD-10-CM | POA: Diagnosis not present

## 2019-03-19 DIAGNOSIS — T8131XD Disruption of external operation (surgical) wound, not elsewhere classified, subsequent encounter: Secondary | ICD-10-CM | POA: Diagnosis not present

## 2019-03-19 DIAGNOSIS — K56609 Unspecified intestinal obstruction, unspecified as to partial versus complete obstruction: Secondary | ICD-10-CM | POA: Diagnosis not present

## 2019-03-19 DIAGNOSIS — S31109A Unspecified open wound of abdominal wall, unspecified quadrant without penetration into peritoneal cavity, initial encounter: Secondary | ICD-10-CM | POA: Diagnosis not present

## 2019-03-20 DIAGNOSIS — C189 Malignant neoplasm of colon, unspecified: Secondary | ICD-10-CM | POA: Diagnosis not present

## 2019-03-20 DIAGNOSIS — I10 Essential (primary) hypertension: Secondary | ICD-10-CM | POA: Diagnosis not present

## 2019-03-20 DIAGNOSIS — R739 Hyperglycemia, unspecified: Secondary | ICD-10-CM | POA: Diagnosis not present

## 2019-03-20 DIAGNOSIS — Z483 Aftercare following surgery for neoplasm: Secondary | ICD-10-CM | POA: Diagnosis not present

## 2019-03-20 DIAGNOSIS — Z433 Encounter for attention to colostomy: Secondary | ICD-10-CM | POA: Diagnosis not present

## 2019-03-20 DIAGNOSIS — K7689 Other specified diseases of liver: Secondary | ICD-10-CM | POA: Diagnosis not present

## 2019-03-21 ENCOUNTER — Other Ambulatory Visit: Payer: Self-pay | Admitting: Hematology

## 2019-03-26 DIAGNOSIS — Z433 Encounter for attention to colostomy: Secondary | ICD-10-CM | POA: Diagnosis not present

## 2019-03-26 DIAGNOSIS — C189 Malignant neoplasm of colon, unspecified: Secondary | ICD-10-CM | POA: Diagnosis not present

## 2019-03-26 DIAGNOSIS — K7689 Other specified diseases of liver: Secondary | ICD-10-CM | POA: Diagnosis not present

## 2019-03-26 DIAGNOSIS — R739 Hyperglycemia, unspecified: Secondary | ICD-10-CM | POA: Diagnosis not present

## 2019-03-26 DIAGNOSIS — Z483 Aftercare following surgery for neoplasm: Secondary | ICD-10-CM | POA: Diagnosis not present

## 2019-03-26 DIAGNOSIS — I10 Essential (primary) hypertension: Secondary | ICD-10-CM | POA: Diagnosis not present

## 2019-03-27 ENCOUNTER — Inpatient Hospital Stay: Payer: BC Managed Care – PPO

## 2019-03-27 ENCOUNTER — Encounter: Payer: Self-pay | Admitting: Nurse Practitioner

## 2019-03-27 ENCOUNTER — Other Ambulatory Visit: Payer: Self-pay | Admitting: Hematology

## 2019-03-27 ENCOUNTER — Telehealth: Payer: Self-pay | Admitting: Nurse Practitioner

## 2019-03-27 ENCOUNTER — Inpatient Hospital Stay (HOSPITAL_BASED_OUTPATIENT_CLINIC_OR_DEPARTMENT_OTHER): Payer: BC Managed Care – PPO | Admitting: Nurse Practitioner

## 2019-03-27 ENCOUNTER — Other Ambulatory Visit: Payer: Self-pay

## 2019-03-27 VITALS — BP 164/53 | HR 62 | Temp 98.9°F | Resp 17 | Ht 67.0 in | Wt 180.0 lb

## 2019-03-27 DIAGNOSIS — C189 Malignant neoplasm of colon, unspecified: Secondary | ICD-10-CM | POA: Diagnosis not present

## 2019-03-27 DIAGNOSIS — Z95828 Presence of other vascular implants and grafts: Secondary | ICD-10-CM

## 2019-03-27 DIAGNOSIS — C184 Malignant neoplasm of transverse colon: Secondary | ICD-10-CM | POA: Diagnosis not present

## 2019-03-27 DIAGNOSIS — Z452 Encounter for adjustment and management of vascular access device: Secondary | ICD-10-CM | POA: Diagnosis not present

## 2019-03-27 DIAGNOSIS — Z7189 Other specified counseling: Secondary | ICD-10-CM

## 2019-03-27 DIAGNOSIS — C787 Secondary malignant neoplasm of liver and intrahepatic bile duct: Secondary | ICD-10-CM | POA: Diagnosis not present

## 2019-03-27 DIAGNOSIS — Z5111 Encounter for antineoplastic chemotherapy: Secondary | ICD-10-CM | POA: Diagnosis not present

## 2019-03-27 LAB — CMP (CANCER CENTER ONLY)
ALT: 8 U/L (ref 0–44)
AST: 11 U/L — ABNORMAL LOW (ref 15–41)
Albumin: 3.5 g/dL (ref 3.5–5.0)
Alkaline Phosphatase: 82 U/L (ref 38–126)
Anion gap: 10 (ref 5–15)
BUN: 19 mg/dL (ref 6–20)
CO2: 22 mmol/L (ref 22–32)
Calcium: 9.8 mg/dL (ref 8.9–10.3)
Chloride: 107 mmol/L (ref 98–111)
Creatinine: 0.92 mg/dL (ref 0.44–1.00)
GFR, Est AFR Am: >60 mL/min (ref >60)
GFR, Estimated: >60 mL/min (ref >60)
Glucose, Bld: 108 mg/dL — ABNORMAL HIGH (ref 70–99)
Potassium: 4.3 mmol/L (ref 3.5–5.1)
Sodium: 139 mmol/L (ref 135–145)
Total Bilirubin: 0.3 mg/dL (ref 0.3–1.2)
Total Protein: 7.1 g/dL (ref 6.5–8.1)

## 2019-03-27 LAB — CBC WITH DIFFERENTIAL (CANCER CENTER ONLY)
Abs Immature Granulocytes: 0.01 10*3/uL (ref 0.00–0.07)
Basophils Absolute: 0 10*3/uL (ref 0.0–0.1)
Basophils Relative: 1 %
Eosinophils Absolute: 0.2 10*3/uL (ref 0.0–0.5)
Eosinophils Relative: 5 %
HCT: 29.2 % — ABNORMAL LOW (ref 36.0–46.0)
Hemoglobin: 9.2 g/dL — ABNORMAL LOW (ref 12.0–15.0)
Immature Granulocytes: 0 %
Lymphocytes Relative: 32 %
Lymphs Abs: 1.6 10*3/uL (ref 0.7–4.0)
MCH: 25.7 pg — ABNORMAL LOW (ref 26.0–34.0)
MCHC: 31.5 g/dL (ref 30.0–36.0)
MCV: 81.6 fL (ref 80.0–100.0)
Monocytes Absolute: 0.5 10*3/uL (ref 0.1–1.0)
Monocytes Relative: 10 %
Neutro Abs: 2.7 10*3/uL (ref 1.7–7.7)
Neutrophils Relative %: 52 %
Platelet Count: 192 10*3/uL (ref 150–400)
RBC: 3.58 MIL/uL — ABNORMAL LOW (ref 3.87–5.11)
RDW: 22.9 % — ABNORMAL HIGH (ref 11.5–15.5)
WBC Count: 5.1 10*3/uL (ref 4.0–10.5)
nRBC: 0 % (ref 0.0–0.2)

## 2019-03-27 LAB — IRON AND TIBC
Iron: 46 ug/dL (ref 41–142)
Saturation Ratios: 16 % — ABNORMAL LOW (ref 21–57)
TIBC: 293 ug/dL (ref 236–444)
UIBC: 247 ug/dL (ref 120–384)

## 2019-03-27 LAB — FERRITIN: Ferritin: 25 ng/mL (ref 11–307)

## 2019-03-27 LAB — CEA (IN HOUSE-CHCC): CEA (CHCC-In House): 46.53 ng/mL — ABNORMAL HIGH (ref 0.00–5.00)

## 2019-03-27 MED ORDER — PALONOSETRON HCL INJECTION 0.25 MG/5ML
0.2500 mg | Freq: Once | INTRAVENOUS | Status: AC
  Administered 2019-03-27: 0.25 mg via INTRAVENOUS

## 2019-03-27 MED ORDER — PALONOSETRON HCL INJECTION 0.25 MG/5ML
INTRAVENOUS | Status: AC
  Filled 2019-03-27: qty 5

## 2019-03-27 MED ORDER — DEXAMETHASONE SODIUM PHOSPHATE 10 MG/ML IJ SOLN
10.0000 mg | Freq: Once | INTRAMUSCULAR | Status: AC
  Administered 2019-03-27: 10 mg via INTRAVENOUS

## 2019-03-27 MED ORDER — DEXTROSE 5 % IV SOLN
Freq: Once | INTRAVENOUS | Status: AC
  Filled 2019-03-27: qty 250

## 2019-03-27 MED ORDER — LEUCOVORIN CALCIUM INJECTION 350 MG
400.0000 mg/m2 | Freq: Once | INTRAVENOUS | Status: AC
  Administered 2019-03-27: 808 mg via INTRAVENOUS
  Filled 2019-03-27: qty 40.4

## 2019-03-27 MED ORDER — SODIUM CHLORIDE 0.9 % IV SOLN
2470.0000 mg/m2 | INTRAVENOUS | Status: DC
  Administered 2019-03-27: 5000 mg via INTRAVENOUS
  Filled 2019-03-27: qty 100

## 2019-03-27 MED ORDER — SODIUM CHLORIDE 0.9% FLUSH
10.0000 mL | INTRAVENOUS | Status: DC | PRN
  Administered 2019-03-27: 10 mL
  Filled 2019-03-27: qty 10

## 2019-03-27 MED ORDER — DEXAMETHASONE SODIUM PHOSPHATE 10 MG/ML IJ SOLN
INTRAMUSCULAR | Status: AC
  Filled 2019-03-27: qty 1

## 2019-03-27 NOTE — Telephone Encounter (Signed)
Scheduled appt per 12/22 los.

## 2019-03-27 NOTE — Progress Notes (Signed)
Casey Black   [phone] [phone]) [phone]   Clinic Follow up Note   Patient Care Team: [name], [name], MD as PCP - General (Internal Medicine) Clovis Riley, MD as Consulting Physician (General Surgery) Truitt Merle, MD as Consulting Physician (Hematology) [name], NP as Nurse Practitioner (Nurse Practitioner) 03/27/2019  CHIEF COMPLAINT: F/u colon cancer   SUMMARY OF ONCOLOGIC HISTORY: Oncology History Overview Note  Cancer Staging Adenocarcinoma of colon metastatic to liver [facility]) Staging form: Colon and Rectum, AJCC 8th Edition - Pathologic stage from 01/24/2019: Stage IVA (pT4a, pN1a, pM1a) - Signed by [name], NP on 02/14/2019    Adenocarcinoma of colon metastatic to liver (Colon)  01/23/2019 Imaging   ABD Xray IMPRESSION: 1. Bowel-gas pattern consistent with small bowel obstruction. No free air. 2. No acute chest findings.   01/23/2019 Imaging   CT AP IMPRESSION: Obstructing mid transverse colonic mass with mild regional adenopathy and hepatic metastatic disease. The mass likely extends through the serosa; no ascites or peritoneal nodularity.   01/24/2019 Surgery   Surgeon: Clovis Riley MD Assistant: [name] Procedure performed: Transverse colectomy with end colostomy, liver biopsy Procedure classification: URGENT/EMERGENT Preop diagnosis: Obstructing, metastatic transverse colon mass Post-op diagnosis/intraop findings: Same   01/24/2019 Pathology Results   FINAL MICROSCOPIC DIAGNOSIS:   A. COLON, TRANSVERSE, RESECTION:  Colonic adenocarcinoma, 5 cm.  Carcinoma extends into pericolonic connective tissue and focally to  serosal surface.  Margins not involved.  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   B. LIVER NODULE, LEFT, BIOPSY:  Metastatic adenocarcinoma.    01/24/2019 Cancer Staging   Staging form: Colon and Rectum, AJCC 8th Edition - Pathologic stage from  01/24/2019: Stage IVA (pT4a, pN1a, pM1a) - Signed by [name], NP on 02/14/2019   02/02/2019 Initial Diagnosis   Adenocarcinoma of colon metastatic to liver ([place])   02/26/2019 PET scan   IMPRESSION: 1. Hypermetabolic metastatic disease in the liver and mediastinal/hilar/axillary lymph nodes. 2. Focal hypermetabolism in the rectum. Continued attention on follow-up exams is warranted. 3. Focal hypermetabolism medial to the right adrenal gland may be within a metastatic lymph node, better visualized on 01/23/2019. 4. Aortic atherosclerosis (ICD10-170.0). Coronary artery calcification.   03/12/2019 -  Chemotherapy   FOLFOX q2weeks starting 03/12/19     CURRENT THERAPY: First line FOLFOX starting 03/12/19, held Oxaliplatin and avastin first two cycles for wound healing   INTERVAL HISTORY: Casey Black returns for f/u and treatment as scheduled. She felt "regular" after treatment. Appetite and energy remained normal. Drinks 1 ensure daily. No mucositis, n/v/c/d. Empties bag 2-3 times daily. Her wound vac was removed last week, she does her own dressing changes daily and wound nurse comes every other day. Some drainage, but no erythema or pain. She felt dizzy on standing once last week. Nurse checked her BP at home and it was normal 130/70's. No fall. She is drinking enough. The episode resolved. Denies any abd pain, does not take oxycodone. Denies fever, chills, cough, chest pain, dyspnea, leg swelling, or neuropathy.     MEDICAL HISTORY:  Past Medical History:  Diagnosis Date  . Anemia    low iron  . Cancer [facility])    colon cancer  . Hypertension 01/23/2019  . SBO (small bowel obstruction) ([place]) 01/23/2019    SURGICAL HISTORY: Past Surgical History:  Procedure Laterality Date  . CESAREAN SECTION     x2  . COLOSTOMY N/A 01/24/2019   Procedure: End Loop  Colostomy;  Surgeon: Clovis Riley, MD;  Location: [place];  Service: General;  Laterality: N/A;  . PARTIAL COLECTOMY N/A  01/24/2019   Procedure: PARTIAL COLECTOMY;  Surgeon: Clovis Riley, MD;  Location: [place];  Service: General;  Laterality: N/A;  . PORTACATH PLACEMENT Right 02/28/2019   Procedure: INSERTION PORT-A-CATH WITH ULTRASOUND GUIDANCE;  Surgeon: Clovis Riley, MD;  Location: [place];  Service: General;  Laterality: Right;    I have reviewed the social history and family history with the patient and they are unchanged from previous note.  ALLERGIES:  has No Known Allergies.  MEDICATIONS:  Current Outpatient Medications  Medication Sig Dispense Refill  . acetaminophen (TYLENOL) 325 MG tablet Take 2 tablets (650 mg total) by mouth every 6 (six) hours as needed.    . ferrous sulfate 325 (65 FE) MG tablet Take 1 tablet (325 mg total) by mouth 2 (two) times daily with a meal. 60 tablet 3  . hydrALAZINE (APRESOLINE) 25 MG tablet Take 1 tablet (25 mg total) by mouth every 8 (eight) hours. 90 tablet 0  . Ibuprofen (ADVIL) 200 MG CAPS Take 400 mg by mouth daily as needed (pain).    . isosorbide mononitrate (IMDUR) 30 MG 24 hr tablet Take 1 tablet (30 mg total) by mouth daily. 30 tablet 0  . lidocaine-prilocaine (EMLA) cream Apply to affected area once (Patient taking differently: Apply 1 application topically once. Apply to affected area once) 30 g 3  . lisinopril (ZESTRIL) 40 MG tablet Take 1 tablet (40 mg total) by mouth daily. 30 tablet 0  . loperamide (IMODIUM) 2 MG capsule Take 1 capsule (2 mg total) by mouth as needed for diarrhea or loose stools. 90 capsule 0  . metoprolol tartrate (LOPRESSOR) 50 MG tablet Take 1 tablet (50 mg total) by mouth 2 (two) times daily. 60 tablet 0  . nicotine (NICODERM CQ - DOSED IN MG/24 HR) 7 mg/24hr patch Place 1 patch (7 mg total) onto the skin daily. 7 patch 0  . ondansetron (ZOFRAN) 4 MG tablet Take 1 tablet (4 mg total) by mouth every 6 (six) hours as needed for nausea. 30 tablet 0  . ondansetron (ZOFRAN) 8 MG tablet Take 1 tablet (8 mg total) by mouth 2 (two)  times daily as needed for refractory nausea / vomiting. Start on day 3 after chemotherapy. 30 tablet 1  . oxyCODONE 10 MG TABS Take 0.5-1 tablets (5-10 mg total) by mouth every 4 (four) hours as needed for moderate pain or severe pain (5 moderate, 10 severe). 20 tablet 0  . polycarbophil (FIBERCON) 625 MG tablet Take 1 tablet (625 mg total) by mouth daily. 30 tablet 0  . prochlorperazine (COMPAZINE) 10 MG tablet Take 1 tablet (10 mg total) by mouth every 6 (six) hours as needed (Nausea or vomiting). 30 tablet 1   No current facility-administered medications for this visit.   Facility-Administered Medications Ordered in Other Visits  Medication Dose Route Frequency Provider Last Rate Last Admin  . fluorouracil (ADRUCIL) 5,000 mg in sodium chloride 0.9 % 150 mL chemo infusion  2,470 mg/m2 (Treatment Plan Recorded) Intravenous 1 day or 1 dose Truitt Merle, MD      . leucovorin 808 mg in dextrose 5 % 250 mL infusion  400 mg/m2 (Treatment Plan Recorded) Intravenous Once Truitt Merle, MD 145 mL/hr at 03/27/19 1015 808 mg at 03/27/19 1015    PHYSICAL EXAMINATION: ECOG PERFORMANCE STATUS: 1 - Symptomatic but completely ambulatory  Vitals:   03/27/19  [id number] 03/27/19 0825  BP: (!) 169/54 (!) 164/53  Pulse: (!) 50 62  Resp: 17   Temp: 98.9 F (37.2 C)   SpO2: 100%    Filed Weights   03/27/19 0823  Weight: 180 lb (81.6 kg)    GENERAL:alert, no distress and comfortable SKIN: no rash  EYES: sclera clear LUNGS: clear with normal breathing effort HEART: regular rate & rhythm, no lower extremity edema ABDOMEN: abdomen soft, non-tender and normal bowel sounds. Colostomy bag with brown stool in collection bag. Midline incision with 3.5 x1 cm open wound. Minimal drainage. No surrounding erythema  NEURO: alert & oriented x 3 with fluent speech, no focal motor/sensory deficits      LABORATORY DATA:  I have reviewed the data as listed CBC Latest Ref Rng & Units 03/27/2019 03/12/2019 02/23/2019  WBC 4.0  - 10.5 K/uL 5.1 6.6 6.9  Hemoglobin 12.0 - 15.0 g/dL 9.2(L) 9.0(L) 8.9(L)  Hematocrit 36.0 - 46.0 % 29.2(L) 28.7(L) 28.8(L)  Platelets 150 - 400 K/uL 192 285 214     CMP Latest Ref Rng & Units 03/27/2019 03/12/2019 02/23/2019  Glucose 70 - 99 mg/dL 108(H) 101(H) 94  BUN 6 - 20 mg/dL 19 14 7   Creatinine 0.44 - 1.00 mg/dL 0.92 0.99 0.88  Sodium 135 - 145 mmol/L 139 140 137  Potassium 3.5 - 5.1 mmol/L 4.3 4.3 4.7  Chloride 98 - 111 mmol/L 107 108 102  CO2 22 - 32 mmol/L 22 25 27   Calcium 8.9 - 10.3 mg/dL 9.8 9.6 9.4  Total Protein 6.5 - 8.1 g/dL 7.1 7.0 7.1  Total Bilirubin 0.3 - 1.2 mg/dL 0.3 0.3 0.4  Alkaline Phos 38 - 126 U/L 82 93 92  AST 15 - 41 U/L 11(L) 20 14(L)  ALT 0 - 44 U/L 8 14 11       RADIOGRAPHIC STUDIES: I have personally reviewed the radiological images as listed and agreed with the findings in the report. No results found.   ASSESSMENT & PLAN: Casey Black a 58 y.o.femalewith   1. Adenocarcinoma of transverse colon, moderately differentiated, pT4aN1aM1a stage IV with liverand nodalmetastasis, MMR normal  -Diagnosed in 01/2019 afteremergent colectomy and liver biopsy. 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  -PET from 02/26/19 showsknown liver metastasis and metastatic lymphadenopathy in chest and right axilla.  -Unfortunately, her cancer is stage IV and no longer eligible for surgery. She understands her cancer is likely not curable but still treatable -Dr. Burr Medico recommended first line palliative chemo with FOLFOX and avastin. Oxaliplatin and Avastin held for first cycle due to wound vac in place.  -[name] shows MSI stable disease, Kras+ I reviewed this with her. No targetable mutation, she is not a candidate for EGFR or immunotherapy. Plan to proceed with avastin when her wound heals.  -Casey Black appears stable today. She tolerated cycle 1 leuc/5FU very well overall. Her wound vac is removed, wound remains open 3.5  cm, no signs of infection. Labs stable. Iron is improving, continue iron BID. CEA unchanged.  -I recommend to proceed with cycle 2 leuc/5FU today. Will hold oxali and avastin for further wound healing. I anticipate she will be ready for FOLFOX when she returns in 2 weeks.  -F/u in 2 weeks with cycle 3  2. Anorexia  -she reportedly weighed 253 lbs a few years ago, and weighed 203 lbs at symptom onset~11/2018 -190 lbsin 02/2019.  -Continue to f/u withdietician -weight stable on chemo  3. Abdominal wound -She had a  large open midline incision after surgery, closed with wound vac. -Wound care at home MWF for dressing changes, will continue and f/u with Dr Kae Heller. -Wound vac d/c'd ~12/14, It remains open 3.5x1 cm, no signs of infection. Continue wound care.   4. 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  -Continue BID  5. HTN -on hydralazine, isosorbide, lisinopril, metoprolol. Continue medications and f/u with PCP. -BP is elevated today, she is nervous here. Normal at home per patient  -monitoring  6. Social support -She worked in [employer] at 2 different jobs, but has been out of work lately due to her symptoms  -She is not getting disability, no pay, but her employer kept her on their insurance plan -has been referred to SW to discuss financial option/resources   7. Goals of care discussion -Goal is palliative, to control disease and prolong her life. She understands her cancer is not curable at this stage, and that we will continue treatment as long as she can tolerate and her disease is controlled.  -full code    PLAN: -Labs, FO reviewed -Proceed with cycle 2, leuc/5FU only  -F/u in 2 weeks, anticipate adding Oxali with cycle 3 and avastin when would is healed  -Wound care done in clinic by my nurse -F/u in 2 weeks with cycle 3   No  problem-specific Assessment & Plan notes found for this encounter.   No orders of the defined types were placed in this encounter.  All questions were answered. The patient knows to call the clinic with any problems, questions or concerns. No barriers to learning was detected. I spent 20 minutes counseling the patient face to face. The total time spent in the appointment was 25 minutes and more than 50% was on counseling and review of test results     [name], NP 03/27/19

## 2019-03-27 NOTE — Patient Instructions (Signed)

## 2019-03-27 NOTE — Patient Instructions (Signed)
[facility] Discharge Instructions for Patients Receiving Chemotherapy  Today you received the following chemotherapy agents: Leucovorin and Fluorouracil (Adrucil, 5-FU)  To help prevent nausea and vomiting after your treatment, we encourage you to take your nausea medication as directed.    If you develop nausea and vomiting that is not controlled by your nausea medication, call the clinic.   BELOW ARE SYMPTOMS THAT SHOULD BE REPORTED IMMEDIATELY:  *FEVER GREATER THAN 100.5 F  *CHILLS WITH OR WITHOUT FEVER  NAUSEA AND VOMITING THAT IS NOT CONTROLLED WITH YOUR NAUSEA MEDICATION  *UNUSUAL SHORTNESS OF BREATH  *UNUSUAL BRUISING OR BLEEDING  TENDERNESS IN MOUTH AND THROAT WITH OR WITHOUT PRESENCE OF ULCERS  *URINARY PROBLEMS  *BOWEL PROBLEMS  UNUSUAL RASH Items with * indicate a potential emergency and should be followed up as soon as possible.  Feel free to call the clinic should you have any questions or concerns. The clinic phone number is [phone].  Please show the [facility] at check-in to the Emergency Department and triage nurse.  Coronavirus (COVID-19) Are you at risk?  Are you at risk for the Coronavirus (COVID-19)?  To be considered HIGH RISK for Coronavirus (COVID-19), you have to meet the following criteria:  . Traveled to [place], [place], [place], [place] or [place]; or in the [place] to [place], [place], [place], or [place]; and have fever, cough, and shortness of breath within the last 2 weeks of travel OR . Been in close contact with a person diagnosed with COVID-19 within the last 2 weeks and have fever, cough, and shortness of breath . IF YOU DO NOT MEET THESE CRITERIA, YOU ARE CONSIDERED LOW RISK FOR COVID-19.  What to do if you are HIGH RISK for COVID-19?  Marland Kitchen If you are having a medical emergency, call 911. . Seek medical care right away. Before you go to a doctor's office, urgent care or emergency department, call  ahead and tell them about your recent travel, contact with someone diagnosed with COVID-19, and your symptoms. You should receive instructions from your physician's office regarding next steps of care.  . When you arrive at healthcare provider, tell the healthcare staff immediately you have returned from visiting [place], [place], [place], [place] or [place]; or traveled in the [place] to [place], [place], [place], or [place]; in the last two weeks or you have been in close contact with a person diagnosed with COVID-19 in the last 2 weeks.   . Tell the health care staff about your symptoms: fever, cough and shortness of breath. . After you have been seen by a medical provider, you will be either: o Tested for (COVID-19) and discharged home on quarantine except to seek medical care if symptoms worsen, and asked to  - Stay home and avoid contact with others until you get your results (4-5 days)  - Avoid travel on public transportation if possible (such as bus, train, or airplane) or o Sent to the Emergency Department by EMS for evaluation, COVID-19 testing, and possible admission depending on your condition and test results.  What to do if you are LOW RISK for COVID-19?  Reduce your risk of any infection by using the same precautions used for avoiding the common cold or flu:  Marland Kitchen Wash your hands often with soap and warm water for at least 20 seconds.  If soap and water are not readily available, use an alcohol-based hand sanitizer with at least 60% alcohol.  Marland Kitchen  If coughing or sneezing, cover your mouth and nose by coughing or sneezing into the elbow areas of your shirt or coat, into a tissue or into your sleeve (not your hands). . Avoid shaking hands with others and consider head nods or verbal greetings only. . Avoid touching your eyes, nose, or mouth with unwashed hands.  . Avoid close contact with people who are sick. . Avoid places or events with large numbers of people in one location,  like concerts or sporting events. . Carefully consider travel plans you have or are making. . If you are planning any travel outside or inside the [place], visit the CDC's Travelers' Health webpage for the latest health notices. . If you have some symptoms but not all symptoms, continue to monitor at home and seek medical attention if your symptoms worsen. . If you are having a medical emergency, call 911.   [facility] / e-Visit: [email or web address]         [facility]: [facility]: [phone]                   [facility]: [phone]

## 2019-03-29 ENCOUNTER — Inpatient Hospital Stay: Payer: BC Managed Care – PPO

## 2019-03-29 ENCOUNTER — Other Ambulatory Visit: Payer: Self-pay

## 2019-03-29 VITALS — BP 158/44 | HR 60 | Temp 98.6°F

## 2019-03-29 DIAGNOSIS — Z5111 Encounter for antineoplastic chemotherapy: Secondary | ICD-10-CM | POA: Diagnosis not present

## 2019-03-29 DIAGNOSIS — Z7189 Other specified counseling: Secondary | ICD-10-CM

## 2019-03-29 DIAGNOSIS — C787 Secondary malignant neoplasm of liver and intrahepatic bile duct: Secondary | ICD-10-CM | POA: Diagnosis not present

## 2019-03-29 DIAGNOSIS — C184 Malignant neoplasm of transverse colon: Secondary | ICD-10-CM | POA: Diagnosis not present

## 2019-03-29 DIAGNOSIS — Z452 Encounter for adjustment and management of vascular access device: Secondary | ICD-10-CM | POA: Diagnosis not present

## 2019-03-29 DIAGNOSIS — C189 Malignant neoplasm of colon, unspecified: Secondary | ICD-10-CM

## 2019-03-29 MED ORDER — HEPARIN SOD (PORK) LOCK FLUSH 100 UNIT/ML IV SOLN
500.0000 [IU] | Freq: Once | INTRAVENOUS | Status: AC | PRN
  Administered 2019-03-29: 500 [IU]
  Filled 2019-03-29: qty 5

## 2019-03-29 MED ORDER — SODIUM CHLORIDE 0.9% FLUSH
10.0000 mL | INTRAVENOUS | Status: DC | PRN
  Administered 2019-03-29: 10 mL
  Filled 2019-03-29: qty 10

## 2019-04-04 ENCOUNTER — Telehealth: Payer: Self-pay | Admitting: Internal Medicine

## 2019-04-04 NOTE — Telephone Encounter (Signed)
Medication Refill - Medication: isosorbide mononitrate (IMDUR) 30 MG 24 hr tablet  lisinopril (ZESTRIL) 40 MG tablet   metoprolol tartrate (LOPRESSOR) 50 MG tablet   Has the patient contacted their pharmacy? No. (Agent: If no, request that the patient contact the pharmacy for the refill.) (Agent: If yes, when and what did the pharmacy advise?)  Preferred Pharmacy (with phone number or street name):  [id number] - [place], [place] Phone:  [phone]  Fax:  [phone]       Agent: Please be advised that RX refills may take up to 3 business days. We ask that you follow-up with your pharmacy.

## 2019-04-04 NOTE — Progress Notes (Signed)
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 Bowel-gas pattern consistent with small bowel obstruction. No free air. 2. No acute chest findings.   01/23/2019 Imaging   CT AP IMPRESSION: Obstructing mid transverse colonic mass with mild regional adenopathy and hepatic metastatic disease. The mass likely extends through the serosa; no ascites or peritoneal nodularity.   01/24/2019 Surgery   Surgeon: Clovis Riley MD Assistant: [name] Procedure performed: Transverse colectomy with end colostomy, liver biopsy Procedure classification: URGENT/EMERGENT Preop diagnosis: Obstructing, metastatic transverse colon mass Post-op diagnosis/intraop findings: Same   01/24/2019 Pathology Results   FINAL MICROSCOPIC DIAGNOSIS:   A. COLON, TRANSVERSE, RESECTION:  Colonic adenocarcinoma, 5 cm.  Carcinoma extends into pericolonic connective tissue and focally to  serosal surface.  Margins not involved.  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   B. LIVER NODULE, LEFT, BIOPSY:  Metastatic adenocarcinoma.    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 Hypermetabolic metastatic disease in the liver and mediastinal/hilar/axillary lymph nodes. 2. Focal hypermetabolism in the rectum. Continued attention on follow-up exams is warranted. 3. Focal hypermetabolism medial to the right adrenal gland may be within a metastatic lymph node, better visualized on 01/23/2019. 4. Aortic atherosclerosis (ICD10-170.0). Coronary artery calcification.   03/12/2019 -  Chemotherapy   FOLFOX q2weeks starting 03/12/19      CURRENT THERAPY:  FOLFOX q2weeks starting 03/12/19. Oxaliplatin and Avastin held with C1-2 due to open wound   INTERVAL HISTORY:  [name] is here for a follow up and treatment. She presents to the clinic alone. She denies issues with chemo except mild dizziness. She notes she happens the day before she returns for her next cycle. She notes her BP would be mildly low around this time. She is on BP meds metoprolol, lisinopril, Hydralazine. She notes she is eating adequately. She did have one episode of nausea based on something she ate. She denies abdominal pain.     REVIEW OF SYSTEMS:   Constitutional: Denies fevers, chills (+) Adequate eating with mild weight loss  Eyes: Denies blurriness of vision Ears, nose, mouth, throat, and face: Denies mucositis or sore throat Respiratory: Denies cough, dyspnea or wheezes Cardiovascular: Denies palpitation, chest discomfort or lower extremity swelling Gastrointestinal:  Denies nausea, heartburn or change in bowel habits Skin: Denies abnormal skin rashes Lymphatics: Denies new lymphadenopathy or easy bruising Neurological:Denies numbness, tingling or new weaknesses Behavioral/Psych: Mood is stable, no new changes  All other systems were  reviewed  with the patient and are negative.  MEDICAL HISTORY:  Past Medical History:  Diagnosis Date  . Anemia    low iron  . Cancer [facility])    colon cancer  . Hypertension 01/23/2019  . SBO (small bowel obstruction) ([facility]) 01/23/2019    SURGICAL HISTORY: Past Surgical History:  Procedure Laterality Date  . CESAREAN SECTION     x2  . COLOSTOMY N/A 01/24/2019   Procedure: End Loop Colostomy;  Surgeon: Clovis Riley, MD;  Location: [facility];  Service: General;  Laterality: N/A;  . PARTIAL COLECTOMY N/A 01/24/2019   Procedure: PARTIAL COLECTOMY;  Surgeon: Clovis Riley, MD;  Location: [facility];  Service: General;  Laterality: N/A;  . PORTACATH PLACEMENT Right 02/28/2019   Procedure: INSERTION PORT-A-CATH WITH ULTRASOUND GUIDANCE;  Surgeon: Clovis Riley, MD;  Location: [facility];  Service: General;  Laterality: Right;    I have reviewed the social history and family history with the patient and they are unchanged from previous note.  ALLERGIES:  has No Known Allergies.  MEDICATIONS:  Current Outpatient Medications  Medication Sig Dispense Refill  . acetaminophen (TYLENOL) 325 MG tablet Take 2 tablets (650 mg total) by mouth every 6 (six) hours as needed.    . ferrous sulfate 325 (65 FE) MG tablet Take 1 tablet (325 mg total) by mouth 2 (two) times daily with a meal. 60 tablet 3  . hydrALAZINE (APRESOLINE) 25 MG tablet Take 1 tablet (25 mg total) by mouth every 8 (eight) hours. 90 tablet 0  . Ibuprofen (ADVIL) 200 MG CAPS Take 400 mg by mouth daily as needed (pain).    . isosorbide mononitrate (IMDUR) 30 MG 24 hr tablet Take 1 tablet (30 mg total) by mouth daily. 30 tablet 0  . lidocaine-prilocaine (EMLA) cream Apply to affected area once (Patient taking differently: Apply 1 application topically once. Apply to affected area once) 30 g 3  . lisinopril (ZESTRIL) 40 MG tablet Take 1 tablet (40 mg total) by mouth daily. 30 tablet 0  . loperamide (IMODIUM) 2 MG capsule Take 1 capsule (2 mg  total) by mouth as needed for diarrhea or loose stools. 90 capsule 0  . metoprolol tartrate (LOPRESSOR) 50 MG tablet Take 1 tablet (50 mg total) by mouth 2 (two) times daily. 60 tablet 0  . nicotine (NICODERM CQ - DOSED IN MG/24 HR) 7 mg/24hr patch Place 1 patch (7 mg total) onto the skin daily. 7 patch 0  . ondansetron (ZOFRAN) 4 MG tablet Take 1 tablet (4 mg total) by mouth every 6 (six) hours as needed for nausea. 30 tablet 0  . ondansetron (ZOFRAN) 8 MG tablet Take 1 tablet (8 mg total) by mouth 2 (two) times daily as needed for refractory nausea / vomiting. Start on day 3 after chemotherapy. 30 tablet 1  . oxyCODONE 10 MG TABS Take 0.5-1 tablets (5-10 mg total) by mouth every 4 (four) hours as needed for moderate pain or severe pain (5 moderate, 10 severe). 20 tablet 0  . polycarbophil (FIBERCON) 625 MG tablet Take 1 tablet (625 mg total) by mouth daily. 30 tablet 0  . prochlorperazine (COMPAZINE) 10 MG tablet Take 1 tablet (10 mg total) by mouth every 6 (six) hours as needed (Nausea or vomiting). 30 tablet 1   No current facility-administered medications for this visit.   Facility-Administered Medications Ordered in Other Visits  Medication Dose Route Frequency Provider Last Rate Last Admin  . dexamethasone (DECADRON) injection 10 mg  10  mg Intravenous Once Truitt Merle, MD      . dextrose 5 % solution   Intravenous Once Truitt Merle, MD      . fluorouracil (ADRUCIL) 4,850 mg in sodium chloride 0.9 % 53 mL chemo infusion  2,400 mg/m2 (Treatment Plan Recorded) Intravenous 1 day or 1 dose Truitt Merle, MD      . leucovorin 808 mg in dextrose 5 % 250 mL infusion  400 mg/m2 (Treatment Plan Recorded) Intravenous Once Truitt Merle, MD      . oxaliplatin (ELOXATIN) 170 mg in dextrose 5 % 500 mL chemo infusion  85 mg/m2 (Treatment Plan Recorded) Intravenous Once Truitt Merle, MD      . palonosetron (ALOXI) injection 0.25 mg  0.25 mg Intravenous Once Truitt Merle, MD        PHYSICAL EXAMINATION: ECOG PERFORMANCE  STATUS: 1 - Symptomatic but completely ambulatory  Vitals:   04/09/19 1022  BP: (!) 155/68  Pulse: 60  Resp: 17  Temp: 98.7 F (37.1 C)  SpO2: 98%   Filed Weights   04/09/19 1022  Weight: 177 lb 6.4 oz (80.5 kg)    GENERAL:alert, no distress and comfortable SKIN: skin color, texture, turgor are normal, no rashes or significant lesions EYES: normal, Conjunctiva are pink and non-injected, sclera clear  NECK: supple, thyroid normal size, non-tender, without nodularity LYMPH:  no palpable lymphadenopathy in the cervical, axillary  LUNGS: clear to auscultation and percussion with normal breathing effort HEART: regular rate & rhythm and no murmurs and no lower extremity edema ABDOMEN:abdomen soft, non-tender and normal bowel sounds (+) Colostomy bag in right mid abdomen. Midline abdominal wound closed with pink soft tissue at botton, with mild discharge.  Musculoskeletal:no cyanosis of digits and no clubbing  NEURO: alert & oriented x 3 with fluent speech, no focal motor/sensory deficits  LABORATORY DATA:  I have reviewed the data as listed CBC Latest Ref Rng & Units 04/09/2019 03/27/2019 03/12/2019  WBC 4.0 - 10.5 K/uL 6.7 5.1 6.6  Hemoglobin 12.0 - 15.0 g/dL 10.0(L) 9.2(L) 9.0(L)  Hematocrit 36.0 - 46.0 % 31.9(L) 29.2(L) 28.7(L)  Platelets 150 - 400 K/uL 220 192 285     CMP Latest Ref Rng & Units 04/09/2019 03/27/2019 03/12/2019  Glucose 70 - 99 mg/dL 99 108(H) 101(H)  BUN 6 - 20 mg/dL 16 19 14   Creatinine 0.44 - 1.00 mg/dL 1.00 0.92 0.99  Sodium 135 - 145 mmol/L 139 139 140  Potassium 3.5 - 5.1 mmol/L 3.9 4.3 4.3  Chloride 98 - 111 mmol/L 108 107 108  CO2 22 - 32 mmol/L 23 22 25   Calcium 8.9 - 10.3 mg/dL 9.5 9.8 9.6  Total Protein 6.5 - 8.1 g/dL 7.3 7.1 7.0  Total Bilirubin 0.3 - 1.2 mg/dL 0.4 0.3 0.3  Alkaline Phos 38 - 126 U/L 87 82 93  AST 15 - 41 U/L 10(L) 11(L) 20  ALT 0 - 44 U/L 8 8 14       RADIOGRAPHIC STUDIES: I have personally reviewed the radiological images  as listed and agreed with the findings in the report. No results found.   ASSESSMENT & PLAN:  Casey Black is a 58 y.o. female with   1. 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 PET from 02/26/19 and discussed the findings which shows known liver metastasis and metastatic lymphadenopathy  in chest and right axilla.  -We discussed her cancer is stage IV and no longer eligible for surgery. The likelihood of curing this is very low due to her diffuse metastasis, but still very treatable.  -I started her on first-line FOLFOX q2weeks on 03/12/19 to control her disease. Will hold Avastin for first few cycles due to her wound issue.  -[name] shows MSI stable disease, Kras+ I reviewed this with her. No targetable mutation, she is not a candidate for EGFR inibitor or immunotherapy. Plan to proceed with avastin when her wound heals.  -S/p C2 5FU she has tolerated well with no major issues. She has had mild dizziness with lower BP, will monitor. I encouraged her to continue to eat adequately to maintain weight.  -Her abdominal wound has healed. Labs reviewed and adequate to proceed with full treat C3 FOLFOX and Avastin today, she agrees. -F/u in 2 weeks   2. Anorexia  -She reportedly weighed 253 lbs a few years ago, and weighed 203 lbs at symptom onset~11/2018 -190 lbs in 02/2019.  -As she recovers from surgery she is eating more and being more active. Weight is still trending down.   -Continue to f/u with dietician   3. Abdominal wound -She has a large open midline incision, closed with wound vac. Wound vac has been removed.  -Wound care comes to her home MWF for dressing changes, will continue and f/u with Dr Linus Salmons.  -She has pain with wound care.  She takes Oxycodone when this occurs.  -Her wound is closing and healing well. Mild discharge on exam today (04/09/19). Will continue wound care  4. 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 Anemia continues to improve.  -will give IV iron if needed   5. HTN -on hydralazine, isosorbide, lisinopril, metoprolol. Continue medications and f/u with PCP.  -Patient notes the before her next cycle chemo her BP is low and experiences dizziness. I recommend she continue to monitor her BP more closely, may adjust meds if indicated.   6. Social support -She worked in [employer] at 2 different jobs, but has been out of work lately due to her symptoms  -She is not getting disability, no pay, but her employer kept her on their insurance plan -She may be eligible for Medicaid -She agrees to referral to SW to discuss   7. Goals of care discussion -The patient understands the goal of care is palliative. -I recommend DNR/DNI, she will think about it   PLAN: -Labs reviewed and adequate to proceed with C3 FOLFOX and avastin -Lab, flush, f/u and FOLFOX and avastin in 2 and 4 weeks.    No problem-specific Assessment & Plan notes found for this encounter.   No orders of the defined types were placed in this encounter.  All questions were answered. The patient knows to call the clinic with any problems, questions or concerns. No barriers to learning was detected. The total time spent in the appointment was 25 minutes.     Truitt Merle, MD 04/09/2019   I, Joslyn Devon, am acting as scribe for Truitt Merle, MD.   I have reviewed the above documentation for accuracy and completeness, and I agree with the above.

## 2019-04-05 DIAGNOSIS — K7689 Other specified diseases of liver: Secondary | ICD-10-CM | POA: Diagnosis not present

## 2019-04-05 DIAGNOSIS — R739 Hyperglycemia, unspecified: Secondary | ICD-10-CM | POA: Diagnosis not present

## 2019-04-05 DIAGNOSIS — Z483 Aftercare following surgery for neoplasm: Secondary | ICD-10-CM | POA: Diagnosis not present

## 2019-04-05 DIAGNOSIS — C189 Malignant neoplasm of colon, unspecified: Secondary | ICD-10-CM | POA: Diagnosis not present

## 2019-04-05 DIAGNOSIS — Z433 Encounter for attention to colostomy: Secondary | ICD-10-CM | POA: Diagnosis not present

## 2019-04-05 DIAGNOSIS — I10 Essential (primary) hypertension: Secondary | ICD-10-CM | POA: Diagnosis not present

## 2019-04-05 MED ORDER — LISINOPRIL 40 MG PO TABS: 40.0000 mg | ORAL_TABLET | Freq: Every day | ORAL | 0 refills | Status: DC

## 2019-04-05 MED ORDER — METOPROLOL TARTRATE 50 MG PO TABS: 50.0000 mg | ORAL_TABLET | Freq: Two times a day (BID) | ORAL | 0 refills | Status: DC

## 2019-04-05 MED ORDER — ISOSORBIDE MONONITRATE ER 30 MG PO TB24: 30.0000 mg | ORAL_TABLET | Freq: Every day | ORAL | 0 refills | Status: DC

## 2019-04-09 ENCOUNTER — Inpatient Hospital Stay: Payer: BC Managed Care – PPO | Admitting: Nutrition

## 2019-04-09 ENCOUNTER — Inpatient Hospital Stay (HOSPITAL_BASED_OUTPATIENT_CLINIC_OR_DEPARTMENT_OTHER): Payer: BC Managed Care – PPO | Admitting: Hematology

## 2019-04-09 ENCOUNTER — Encounter: Payer: Self-pay | Admitting: Hematology

## 2019-04-09 ENCOUNTER — Other Ambulatory Visit: Payer: Self-pay | Admitting: Lab

## 2019-04-09 ENCOUNTER — Inpatient Hospital Stay: Payer: BC Managed Care – PPO

## 2019-04-09 ENCOUNTER — Other Ambulatory Visit: Payer: Self-pay

## 2019-04-09 ENCOUNTER — Inpatient Hospital Stay: Payer: BC Managed Care – PPO | Attending: Nurse Practitioner

## 2019-04-09 VITALS — BP 155/68 | HR 60 | Temp 98.7°F | Resp 17 | Ht 67.0 in | Wt 177.4 lb

## 2019-04-09 DIAGNOSIS — Z7189 Other specified counseling: Secondary | ICD-10-CM

## 2019-04-09 DIAGNOSIS — D500 Iron deficiency anemia secondary to blood loss (chronic): Secondary | ICD-10-CM | POA: Diagnosis not present

## 2019-04-09 DIAGNOSIS — C189 Malignant neoplasm of colon, unspecified: Secondary | ICD-10-CM

## 2019-04-09 DIAGNOSIS — C787 Secondary malignant neoplasm of liver and intrahepatic bile duct: Secondary | ICD-10-CM | POA: Insufficient documentation

## 2019-04-09 DIAGNOSIS — C778 Secondary and unspecified malignant neoplasm of lymph nodes of multiple regions: Secondary | ICD-10-CM | POA: Insufficient documentation

## 2019-04-09 DIAGNOSIS — Z5112 Encounter for antineoplastic immunotherapy: Secondary | ICD-10-CM | POA: Insufficient documentation

## 2019-04-09 DIAGNOSIS — C184 Malignant neoplasm of transverse colon: Secondary | ICD-10-CM | POA: Insufficient documentation

## 2019-04-09 DIAGNOSIS — Z5111 Encounter for antineoplastic chemotherapy: Secondary | ICD-10-CM | POA: Diagnosis not present

## 2019-04-09 DIAGNOSIS — I10 Essential (primary) hypertension: Secondary | ICD-10-CM

## 2019-04-09 DIAGNOSIS — Z452 Encounter for adjustment and management of vascular access device: Secondary | ICD-10-CM | POA: Diagnosis not present

## 2019-04-09 DIAGNOSIS — Z95828 Presence of other vascular implants and grafts: Secondary | ICD-10-CM

## 2019-04-09 LAB — CMP (CANCER CENTER ONLY)
ALT: 8 U/L (ref 0–44)
AST: 10 U/L — ABNORMAL LOW (ref 15–41)
Albumin: 3.6 g/dL (ref 3.5–5.0)
Alkaline Phosphatase: 87 U/L (ref 38–126)
Anion gap: 8 (ref 5–15)
BUN: 16 mg/dL (ref 6–20)
CO2: 23 mmol/L (ref 22–32)
Calcium: 9.5 mg/dL (ref 8.9–10.3)
Chloride: 108 mmol/L (ref 98–111)
Creatinine: 1 mg/dL (ref 0.44–1.00)
GFR, Est AFR Am: >60 mL/min (ref >60)
GFR, Estimated: >60 mL/min (ref >60)
Glucose, Bld: 99 mg/dL (ref 70–99)
Potassium: 3.9 mmol/L (ref 3.5–5.1)
Sodium: 139 mmol/L (ref 135–145)
Total Bilirubin: 0.4 mg/dL (ref 0.3–1.2)
Total Protein: 7.3 g/dL (ref 6.5–8.1)

## 2019-04-09 LAB — CBC WITH DIFFERENTIAL (CANCER CENTER ONLY)
Abs Immature Granulocytes: 0.01 10*3/uL (ref 0.00–0.07)
Basophils Absolute: 0 10*3/uL (ref 0.0–0.1)
Basophils Relative: 1 %
Eosinophils Absolute: 0.2 10*3/uL (ref 0.0–0.5)
Eosinophils Relative: 3 %
HCT: 31.9 % — ABNORMAL LOW (ref 36.0–46.0)
Hemoglobin: 10 g/dL — ABNORMAL LOW (ref 12.0–15.0)
Immature Granulocytes: 0 %
Lymphocytes Relative: 28 %
Lymphs Abs: 1.9 10*3/uL (ref 0.7–4.0)
MCH: 26 pg (ref 26.0–34.0)
MCHC: 31.3 g/dL (ref 30.0–36.0)
MCV: 83.1 fL (ref 80.0–100.0)
Monocytes Absolute: 0.6 10*3/uL (ref 0.1–1.0)
Monocytes Relative: 9 %
Neutro Abs: 4 10*3/uL (ref 1.7–7.7)
Neutrophils Relative %: 59 %
Platelet Count: 220 10*3/uL (ref 150–400)
RBC: 3.84 MIL/uL — ABNORMAL LOW (ref 3.87–5.11)
RDW: 22.3 % — ABNORMAL HIGH (ref 11.5–15.5)
WBC Count: 6.7 10*3/uL (ref 4.0–10.5)
nRBC: 0 % (ref 0.0–0.2)

## 2019-04-09 LAB — TOTAL PROTEIN, URINE DIPSTICK: Protein, ur: 30 mg/dL — AB

## 2019-04-09 MED ORDER — SODIUM CHLORIDE 0.9 % IV SOLN
INTRAVENOUS | Status: DC
  Filled 2019-04-09: qty 250

## 2019-04-09 MED ORDER — SODIUM CHLORIDE 0.9 % IV SOLN
2400.0000 mg/m2 | INTRAVENOUS | Status: DC
  Administered 2019-04-09: 4850 mg via INTRAVENOUS
  Filled 2019-04-09: qty 97

## 2019-04-09 MED ORDER — DEXTROSE 5 % IV SOLN
Freq: Once | INTRAVENOUS | Status: AC
  Filled 2019-04-09: qty 250

## 2019-04-09 MED ORDER — DEXAMETHASONE SODIUM PHOSPHATE 10 MG/ML IJ SOLN
10.0000 mg | Freq: Once | INTRAMUSCULAR | Status: AC
  Administered 2019-04-09: 10 mg via INTRAVENOUS

## 2019-04-09 MED ORDER — PALONOSETRON HCL INJECTION 0.25 MG/5ML
INTRAVENOUS | Status: AC
  Filled 2019-04-09: qty 5

## 2019-04-09 MED ORDER — OXALIPLATIN CHEMO INJECTION 100 MG/20ML
85.0000 mg/m2 | Freq: Once | INTRAVENOUS | Status: AC
  Administered 2019-04-09: 170 mg via INTRAVENOUS
  Filled 2019-04-09: qty 34

## 2019-04-09 MED ORDER — LEUCOVORIN CALCIUM INJECTION 350 MG
400.0000 mg/m2 | Freq: Once | INTRAVENOUS | Status: AC
  Administered 2019-04-09: 808 mg via INTRAVENOUS
  Filled 2019-04-09: qty 40.4

## 2019-04-09 MED ORDER — DEXAMETHASONE SODIUM PHOSPHATE 10 MG/ML IJ SOLN
INTRAMUSCULAR | Status: AC
  Filled 2019-04-09: qty 1

## 2019-04-09 MED ORDER — SODIUM CHLORIDE 0.9 % IV SOLN
5.0000 mg/kg | Freq: Once | INTRAVENOUS | Status: AC
  Administered 2019-04-09: 400 mg via INTRAVENOUS
  Filled 2019-04-09: qty 16

## 2019-04-09 MED ORDER — PALONOSETRON HCL INJECTION 0.25 MG/5ML
0.2500 mg | Freq: Once | INTRAVENOUS | Status: AC
  Administered 2019-04-09: 0.25 mg via INTRAVENOUS

## 2019-04-09 MED ORDER — SODIUM CHLORIDE 0.9% FLUSH
10.0000 mL | INTRAVENOUS | Status: DC | PRN
  Administered 2019-04-09: 10 mL
  Filled 2019-04-09: qty 10

## 2019-04-09 NOTE — Patient Instructions (Signed)
[facility] Discharge Instructions for Patients Receiving Chemotherapy  Today you received the following chemotherapy agents: bevacizumab, oxaliplatin, leucovorin, and 5FU.  To help prevent nausea and vomiting after your treatment, we encourage you to take your nausea medication as directed.   If you develop nausea and vomiting that is not controlled by your nausea medication, call the clinic.   BELOW ARE SYMPTOMS THAT SHOULD BE REPORTED IMMEDIATELY:  *FEVER GREATER THAN 100.5 F  *CHILLS WITH OR WITHOUT FEVER  NAUSEA AND VOMITING THAT IS NOT CONTROLLED WITH YOUR NAUSEA MEDICATION  *UNUSUAL SHORTNESS OF BREATH  *UNUSUAL BRUISING OR BLEEDING  TENDERNESS IN MOUTH AND THROAT WITH OR WITHOUT PRESENCE OF ULCERS  *URINARY PROBLEMS  *BOWEL PROBLEMS  UNUSUAL RASH Items with * indicate a potential emergency and should be followed up as soon as possible.  Feel free to call the clinic should you have any questions or concerns. The clinic phone number is [phone].  Please show the [facility] at check-in to the Emergency Department and triage nurse.  Bevacizumab injection What is this medicine? BEVACIZUMAB (be va SIZ yoo mab) is a monoclonal antibody. It is used to treat many types of cancer. This medicine may be used for other purposes; ask your health care provider or pharmacist if you have questions. COMMON BRAND NAME(S): Avastin, MVASI, Zirabev What should I tell my health care provider before I take this medicine? They need to know if you have any of these conditions:  diabetes  heart disease  high blood pressure  history of coughing up blood  prior anthracycline chemotherapy (e.g., doxorubicin, daunorubicin, epirubicin)  recent or ongoing radiation therapy  recent or planning to have surgery  stroke  an unusual or allergic reaction to bevacizumab, hamster proteins, mouse proteins, other medicines, foods, dyes, or preservatives  pregnant or  trying to get pregnant  breast-feeding How should I use this medicine? This medicine is for infusion into a vein. It is given by a health care professional in a hospital or clinic setting. Talk to your pediatrician regarding the use of this medicine in children. Special care may be needed. Overdosage: If you think you have taken too much of this medicine contact a poison control center or emergency room at once. NOTE: This medicine is only for you. Do not share this medicine with others. What if I miss a dose? It is important not to miss your dose. Call your doctor or health care professional if you are unable to keep an appointment. What may interact with this medicine? Interactions are not expected. This list may not describe all possible interactions. Give your health care provider a list of all the medicines, herbs, non-prescription drugs, or dietary supplements you use. Also tell them if you smoke, drink alcohol, or use illegal drugs. Some items may interact with your medicine. What should I watch for while using this medicine? Your condition will be monitored carefully while you are receiving this medicine. You will need important blood work and urine testing done while you are taking this medicine. This medicine may increase your risk to bruise or bleed. Call your doctor or health care professional if you notice any unusual bleeding. Before having surgery, talk to your health care provider to make sure it is ok. This drug can increase the risk of poor healing of your surgical site or wound. You will need to stop this drug for 28 days before surgery. After surgery, wait at least 28 days before restarting this drug.  Make sure the surgical site or wound is healed enough before restarting this drug. Talk to your health care provider if questions. Do not become pregnant while taking this medicine or for 6 months after stopping it. Women should inform their doctor if they wish to become pregnant or  think they might be pregnant. There is a potential for serious side effects to an unborn child. Talk to your health care professional or pharmacist for more information. Do not breast-feed an infant while taking this medicine and for 6 months after the last dose. This medicine has caused ovarian failure in some women. This medicine may interfere with the ability to have a child. You should talk to your doctor or health care professional if you are concerned about your fertility. What side effects may I notice from receiving this medicine? Side effects that you should report to your doctor or health care professional as soon as possible:  allergic reactions like skin rash, itching or hives, swelling of the face, lips, or tongue  chest pain or chest tightness  chills  coughing up blood  high fever  seizures  severe constipation  signs and symptoms of bleeding such as bloody or black, tarry stools; red or dark-brown urine; spitting up blood or brown material that looks like coffee grounds; red spots on the skin; unusual bruising or bleeding from the eye, gums, or nose  signs and symptoms of a blood clot such as breathing problems; chest pain; severe, sudden headache; pain, swelling, warmth in the leg  signs and symptoms of a stroke like changes in vision; confusion; trouble speaking or understanding; severe headaches; sudden numbness or weakness of the face, arm or leg; trouble walking; dizziness; loss of balance or coordination  stomach pain  sweating  swelling of legs or ankles  vomiting  weight gain Side effects that usually do not require medical attention (report to your doctor or health care professional if they continue or are bothersome):  back pain  changes in taste  decreased appetite  dry skin  nausea  tiredness This list may not describe all possible side effects. Call your doctor for medical advice about side effects. You may report side effects to FDA at  1-800-FDA-1088. Where should I keep my medicine? This drug is given in a hospital or clinic and will not be stored at home. NOTE: This sheet is a summary. It may not cover all possible information. If you have questions about this medicine, talk to your doctor, pharmacist, or health care provider.  2020 Elsevier/Gold Standard (2019-01-17 10:50:46)  Oxaliplatin Injection What is this medicine? OXALIPLATIN (ox AL i PLA tin) is a chemotherapy drug. It targets fast dividing cells, like cancer cells, and causes these cells to die. This medicine is used to treat cancers of the colon and rectum, and many other cancers. This medicine may be used for other purposes; ask your health care provider or pharmacist if you have questions. COMMON BRAND NAME(S): Eloxatin What should I tell my health care provider before I take this medicine? They need to know if you have any of these conditions:  heart disease  history of irregular heartbeat  liver disease  low blood counts, like white cells, platelets, or red blood cells  lung or breathing disease, like asthma  take medicines that treat or prevent blood clots  tingling of the fingers or toes, or other nerve disorder  an unusual or allergic reaction to oxaliplatin, other chemotherapy, other medicines, foods, dyes, or preservatives  pregnant  or trying to get pregnant  breast-feeding How should I use this medicine? This drug is given as an infusion into a vein. It is administered in a hospital or clinic by a specially trained health care professional. Talk to your pediatrician regarding the use of this medicine in children. Special care may be needed. Overdosage: If you think you have taken too much of this medicine contact a poison control center or emergency room at once. NOTE: This medicine is only for you. Do not share this medicine with others. What if I miss a dose? It is important not to miss a dose. Call your doctor or health care  professional if you are unable to keep an appointment. What may interact with this medicine? Do not take this medicine with any of the following medications:  cisapride  dronedarone  pimozide  thioridazine This medicine may also interact with the following medications:  aspirin and aspirin-like medicines  certain medicines that treat or prevent blood clots like warfarin, apixaban, dabigatran, and rivaroxaban  cisplatin  cyclosporine  diuretics  medicines for infection like acyclovir, adefovir, amphotericin B, bacitracin, cidofovir, foscarnet, ganciclovir, gentamicin, pentamidine, vancomycin  NSAIDs, medicines for pain and inflammation, like ibuprofen or naproxen  other medicines that prolong the QT interval (an abnormal heart rhythm)  pamidronate  zoledronic acid This list may not describe all possible interactions. Give your health care provider a list of all the medicines, herbs, non-prescription drugs, or dietary supplements you use. Also tell them if you smoke, drink alcohol, or use illegal drugs. Some items may interact with your medicine. What should I watch for while using this medicine? Your condition will be monitored carefully while you are receiving this medicine. You may need blood work done while you are taking this medicine. This medicine may make you feel generally unwell. This is not uncommon as chemotherapy can affect healthy cells as well as cancer cells. Report any side effects. Continue your course of treatment even though you feel ill unless your healthcare professional tells you to stop. This medicine can make you more sensitive to cold. Do not drink cold drinks or use ice. Cover exposed skin before coming in contact with cold temperatures or cold objects. When out in cold weather wear warm clothing and cover your mouth and nose to warm the air that goes into your lungs. Tell your doctor if you get sensitive to the cold. Do not become pregnant while taking  this medicine or for 9 months after stopping it. Women should inform their health care professional if they wish to become pregnant or think they might be pregnant. Men should not father a child while taking this medicine and for 6 months after stopping it. There is potential for serious side effects to an unborn child. Talk to your health care professional for more information. Do not breast-feed a child while taking this medicine or for 3 months after stopping it. This medicine has caused ovarian failure in some women. This medicine may make it more difficult to get pregnant. Talk to your health care professional if you are concerned about your fertility. This medicine has caused decreased sperm counts in some men. This may make it more difficult to father a child. Talk to your health care professional if you are concerned about your fertility. This medicine may increase your risk of getting an infection. Call your health care professional for advice if you get a fever, chills, or sore throat, or other symptoms of a cold or flu. Do  not treat yourself. Try to avoid being around people who are sick. Avoid taking medicines that contain aspirin, acetaminophen, ibuprofen, naproxen, or ketoprofen unless instructed by your health care professional. These medicines may hide a fever. Be careful brushing or flossing your teeth or using a toothpick because you may get an infection or bleed more easily. If you have any dental work done, tell your dentist you are receiving this medicine. What side effects may I notice from receiving this medicine? Side effects that you should report to your doctor or health care professional as soon as possible:  allergic reactions like skin rash, itching or hives, swelling of the face, lips, or tongue  breathing problems  cough  low blood counts - this medicine may decrease the number of white blood cells, red blood cells, and platelets. You may be at increased risk for  infections and bleeding  nausea, vomiting  pain, redness, or irritation at site where injected  pain, tingling, numbness in the hands or feet  signs and symptoms of bleeding such as bloody or black, tarry stools; red or dark brown urine; spitting up blood or brown material that looks like coffee grounds; red spots on the skin; unusual bruising or bleeding from the eyes, gums, or nose  signs and symptoms of a dangerous change in heartbeat or heart rhythm like chest pain; dizziness; fast, irregular heartbeat; palpitations; feeling faint or lightheaded; falls  signs and symptoms of infection like fever; chills; cough; sore throat; pain or trouble passing urine  signs and symptoms of liver injury like dark yellow or brown urine; general ill feeling or flu-like symptoms; light-colored stools; loss of appetite; nausea; right upper belly pain; unusually weak or tired; yellowing of the eyes or skin  signs and symptoms of low red blood cells or anemia such as unusually weak or tired; feeling faint or lightheaded; falls  signs and symptoms of muscle injury like dark urine; trouble passing urine or change in the amount of urine; unusually weak or tired; muscle pain; back pain Side effects that usually do not require medical attention (report to your doctor or health care professional if they continue or are bothersome):  changes in taste  diarrhea  gas  hair loss  loss of appetite  mouth sores This list may not describe all possible side effects. Call your doctor for medical advice about side effects. You may report side effects to FDA at 1-800-FDA-1088. Where should I keep my medicine? This drug is given in a hospital or clinic and will not be stored at home. NOTE: This sheet is a summary. It may not cover all possible information. If you have questions about this medicine, talk to your doctor, pharmacist, or health care provider.  2020 Elsevier/Gold Standard (2018-08-09  12:20:35)

## 2019-04-09 NOTE — Patient Instructions (Signed)

## 2019-04-09 NOTE — Progress Notes (Signed)
Nutrition follow-up completed with patient during infusion for metastatic colon cancer. Patient is receiving FOLFOX today. Noted her wounds have improved and her wound VAC has been discontinued. Noted weight decreased 177.4 pounds January 4 down from 180 pounds December 22. Patient reports she had some vomiting after eating a lot of broccoli. Reports she also loves cabbage and wonders if these vegetables are okay for her to consume.  States she lost her fact sheet from previous nutrition assessment.  Nutrition diagnosis: Inadequate oral intake continues.  Intervention: Reviewed diet strategies for adding in higher fiber, higher gas-forming foods. Provided a fact sheet. Encourage patient to try oral nutrition supplements for added protein.  Encouraged weight stabilization. Questions were answered.  Teach back method used.  Monitoring, evaluation, goals: Patient will tolerate increased calories and protein for weight maintenance and continued healing.  Next visit: Monday, January 18 during infusion.  **Disclaimer: This note was dictated with voice recognition software. Similar sounding words can inadvertently be transcribed and this note may contain transcription errors which may not have been corrected upon publication of note.**

## 2019-04-10 ENCOUNTER — Encounter: Payer: Self-pay | Admitting: Hematology

## 2019-04-11 ENCOUNTER — Other Ambulatory Visit: Payer: Self-pay

## 2019-04-11 ENCOUNTER — Inpatient Hospital Stay: Payer: BC Managed Care – PPO

## 2019-04-11 VITALS — BP 172/60 | HR 50 | Temp 99.1°F | Resp 16

## 2019-04-11 DIAGNOSIS — Z7189 Other specified counseling: Secondary | ICD-10-CM

## 2019-04-11 DIAGNOSIS — C778 Secondary and unspecified malignant neoplasm of lymph nodes of multiple regions: Secondary | ICD-10-CM | POA: Diagnosis not present

## 2019-04-11 DIAGNOSIS — Z452 Encounter for adjustment and management of vascular access device: Secondary | ICD-10-CM | POA: Diagnosis not present

## 2019-04-11 DIAGNOSIS — C787 Secondary malignant neoplasm of liver and intrahepatic bile duct: Secondary | ICD-10-CM | POA: Diagnosis not present

## 2019-04-11 DIAGNOSIS — C189 Malignant neoplasm of colon, unspecified: Secondary | ICD-10-CM

## 2019-04-11 DIAGNOSIS — Z5111 Encounter for antineoplastic chemotherapy: Secondary | ICD-10-CM | POA: Diagnosis not present

## 2019-04-11 DIAGNOSIS — C184 Malignant neoplasm of transverse colon: Secondary | ICD-10-CM | POA: Diagnosis not present

## 2019-04-11 DIAGNOSIS — Z5112 Encounter for antineoplastic immunotherapy: Secondary | ICD-10-CM | POA: Diagnosis not present

## 2019-04-11 MED ORDER — HEPARIN SOD (PORK) LOCK FLUSH 100 UNIT/ML IV SOLN
500.0000 [IU] | Freq: Once | INTRAVENOUS | Status: AC | PRN
  Administered 2019-04-11: 500 [IU]
  Filled 2019-04-11: qty 5

## 2019-04-11 MED ORDER — SODIUM CHLORIDE 0.9% FLUSH
10.0000 mL | INTRAVENOUS | Status: DC | PRN
  Administered 2019-04-11: 10 mL
  Filled 2019-04-11: qty 10

## 2019-04-12 ENCOUNTER — Other Ambulatory Visit: Payer: Self-pay | Admitting: Internal Medicine

## 2019-04-12 DIAGNOSIS — C189 Malignant neoplasm of colon, unspecified: Secondary | ICD-10-CM | POA: Diagnosis not present

## 2019-04-12 NOTE — Telephone Encounter (Signed)
Requested medication (s) are due for refill today: yes  Requested medication (s) are on the active medication list: yes   Future visit scheduled: yes  Notes to clinic:  review medication last filled by different provider    Requested Prescriptions  Pending Prescriptions Disp Refills   polycarbophil (FIBERCON) 625 MG tablet 30 tablet 0    Sig: Take 1 tablet (625 mg total) by mouth daily.      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      Not Delegated - Analgesics:  Opioid Agonists Failed - 04/12/2019 10:19 AM      Failed - This refill cannot be delegated      Failed - Urine Drug Screen completed in last 360 days.      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      Cardiovascular:  ACE Inhibitors Failed -  04/12/2019 10:19 AM      Failed - Last BP in normal range    BP Readings from Last 1 Encounters:  04/11/19 (!) 172/60          Passed - Cr in normal range and within 180 days    Creatinine  Date Value Ref Range Status  04/09/2019 1.00 0.44 - 1.00 mg/dL Final          Passed - K in normal range and within 180 days    Potassium  Date Value Ref Range Status  04/09/2019 3.9 3.5 - 5.1 mmol/L Final          Passed - Patient is not pregnant      Passed - Valid encounter within last 6 months    Recent Outpatient Visits           1 month ago Adenocarcinoma of colon  metastatic to liver [facility])   [place] at [facility], [name], MD       Future Appointments             In 2 months [name], [name], MD [place] at [place], [facility]              metoprolol tartrate (LOPRESSOR) 50 MG tablet 60 tablet 0    Sig: Take 1 tablet (50 mg total) by mouth 2 (two) times daily.      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

## 2019-04-12 NOTE — Telephone Encounter (Signed)
Medication Refill - Medication: lisinopril (ZESTRIL) 40 MG tablet metoprolol tartrate (LOPRESSOR) 50 MG tablet polycarbophil (FIBERCON) 625 MG tablet oxyCODONE 10 MG TABS     Preferred Pharmacy (with phone number or street name):  [id number] - [place], [place] Phone:  [phone]  Fax:  [phone]       Agent: Please be advised that RX refills may take up to 3 business days. We ask that you follow-up with your pharmacy.

## 2019-04-12 NOTE — Telephone Encounter (Signed)
Last office visit 03/13/2019 Next office visit 06/14/2019  Oxycodone HCl 10 MG TABS - not prescribed by Dr Jerilee Hoh

## 2019-04-18 NOTE — Progress Notes (Signed)
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 Bowel-gas pattern consistent with small bowel obstruction. No free air. 2. No acute chest findings.   01/23/2019 Imaging   CT AP IMPRESSION: Obstructing mid transverse colonic mass with mild regional adenopathy and hepatic metastatic disease. The mass likely extends through the serosa; no ascites or peritoneal nodularity.   01/24/2019 Surgery   Surgeon: Clovis Riley MD Assistant: [name] Procedure performed: Transverse colectomy with end colostomy, liver biopsy Procedure classification: URGENT/EMERGENT Preop diagnosis: Obstructing, metastatic transverse colon mass Post-op diagnosis/intraop findings: Same   01/24/2019 Pathology Results   FINAL MICROSCOPIC DIAGNOSIS:   A. COLON, TRANSVERSE, RESECTION:  Colonic adenocarcinoma, 5 cm.  Carcinoma extends into pericolonic connective tissue and focally to  serosal surface.  Margins not involved.  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   B. LIVER NODULE, LEFT, BIOPSY:  Metastatic adenocarcinoma.    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 Hypermetabolic metastatic disease in the liver and mediastinal/hilar/axillary lymph nodes. 2. Focal hypermetabolism in the rectum. Continued attention on follow-up exams is warranted. 3. Focal hypermetabolism medial to the right adrenal gland may be within a metastatic lymph node, better visualized on 01/23/2019. 4. Aortic atherosclerosis (ICD10-170.0). Coronary artery calcification.   03/12/2019 -  Chemotherapy   She started 5FU q2weeks on 03/12/19 for 2 cycles. She started full dose FOLFOX with Avastin on 04/09/19.      CURRENT THERAPY:  She started 5FU q2weeks on 03/12/19 for 2 cycles. She started full dose FOLFOX with Avastin on 04/09/19.   INTERVAL HISTORY:  Casey Black is here for a follow up and treatment. She presents to the clinic alone. She notes with full dose FOLFOX she has left back pain for a day. This has resolved. She denies cold sensitivity or neuropathy, or BM issues. She was able to maintain energy. She notes her surgical incision has healed completely. She notes her PCP does not refill all of her medications. She plans to see her soon.     REVIEW OF SYSTEMS:   Constitutional: Denies fevers, chills or abnormal weight loss Eyes: Denies blurriness of vision Ears, nose, mouth, throat, and face: Denies mucositis or sore throat Respiratory: Denies cough, dyspnea or wheezes Cardiovascular: Denies palpitation, chest discomfort or lower extremity swelling Gastrointestinal:  Denies nausea, heartburn or change in bowel habits Skin: Denies abnormal skin rashes Lymphatics: Denies new lymphadenopathy or easy bruising Neurological:Denies numbness, tingling or new weaknesses Behavioral/Psych: Mood is  stable, no new changes  All other  systems were reviewed with the patient and are negative.  MEDICAL HISTORY:  Past Medical History:  Diagnosis Date  . Anemia    low iron  . Cancer [facility])    colon cancer  . Hypertension 01/23/2019  . SBO (small bowel obstruction) ([place]) 01/23/2019    SURGICAL HISTORY: Past Surgical History:  Procedure Laterality Date  . CESAREAN SECTION     x2  . COLOSTOMY N/A 01/24/2019   Procedure: End Loop Colostomy;  Surgeon: Clovis Riley, MD;  Location: [place];  Service: General;  Laterality: N/A;  . PARTIAL COLECTOMY N/A 01/24/2019   Procedure: PARTIAL COLECTOMY;  Surgeon: Clovis Riley, MD;  Location: [place];  Service: General;  Laterality: N/A;  . PORTACATH PLACEMENT Right 02/28/2019   Procedure: INSERTION PORT-A-CATH WITH ULTRASOUND GUIDANCE;  Surgeon: Clovis Riley, MD;  Location: [place];  Service: General;  Laterality: Right;    I have reviewed the social history and family history with the patient and they are unchanged from previous note.  ALLERGIES:  has No Known Allergies.  MEDICATIONS:  Current Outpatient Medications  Medication Sig Dispense Refill  . acetaminophen (TYLENOL) 325 MG tablet Take 2 tablets (650 mg total) by mouth every 6 (six) hours as needed.    . ferrous sulfate 325 (65 FE) MG tablet Take 1 tablet (325 mg total) by mouth 2 (two) times daily with a meal. 60 tablet 3  . hydrALAZINE (APRESOLINE) 25 MG tablet Take 1 tablet (25 mg total) by mouth every 8 (eight) hours. 90 tablet 0  . Ibuprofen (ADVIL) 200 MG CAPS Take 400 mg by mouth daily as needed (pain).    . isosorbide mononitrate (IMDUR) 30 MG 24 hr tablet Take 1 tablet (30 mg total) by mouth daily. 30 tablet 0  . lidocaine-prilocaine (EMLA) cream Apply to affected area once (Patient taking differently: Apply 1 application topically once. Apply to affected area once) 30 g 3  . lisinopril (ZESTRIL) 40 MG tablet Take 1 tablet (40 mg total) by mouth daily. 30 tablet 0  . loperamide (IMODIUM) 2 MG capsule  Take 1 capsule (2 mg total) by mouth as needed for diarrhea or loose stools. 90 capsule 0  . metoprolol tartrate (LOPRESSOR) 50 MG tablet Take 1 tablet (50 mg total) by mouth 2 (two) times daily. 60 tablet 0  . nicotine (NICODERM CQ - DOSED IN MG/24 HR) 7 mg/24hr patch Place 1 patch (7 mg total) onto the skin daily. 7 patch 0  . ondansetron (ZOFRAN) 4 MG tablet Take 1 tablet (4 mg total) by mouth every 6 (six) hours as needed for nausea. 30 tablet 0  . ondansetron (ZOFRAN) 8 MG tablet Take 1 tablet (8 mg total) by mouth 2 (two) times daily as needed for refractory nausea / vomiting. Start on day 3 after chemotherapy. 30 tablet 1  . oxyCODONE 10 MG TABS Take 0.5-1 tablets (5-10 mg total) by mouth every 4 (four) hours as needed for moderate pain or severe pain (5 moderate, 10 severe). 20 tablet 0  . polycarbophil (FIBERCON) 625 MG tablet Take 1 tablet (625 mg total) by mouth daily. 30 tablet 0  . prochlorperazine (COMPAZINE) 10 MG tablet Take 1 tablet (10 mg total) by mouth every 6 (six) hours as needed (Nausea or vomiting). 30 tablet 1   No current facility-administered medications for this visit.    PHYSICAL EXAMINATION: ECOG PERFORMANCE STATUS: 1 - Symptomatic but completely ambulatory  Vitals:  04/23/19 1019  BP: (!) 221/81  Pulse: (!) 46  Resp: 16  Temp: 99.1 F (37.3 C)  SpO2: 100%   Filed Weights   04/23/19 1019  Weight: 181 lb 8 oz (82.3 kg)    GENERAL:alert, no distress and comfortable SKIN: skin color, texture, turgor are normal, no rashes or significant lesions EYES: normal, Conjunctiva are pink and non-injected, sclera clear  NECK: supple, thyroid normal size, non-tender, without nodularity LYMPH:  no palpable lymphadenopathy in the cervical, axillary  LUNGS: clear to auscultation and percussion with normal breathing effort HEART: regular rate & rhythm and no murmurs and no lower extremity edema ABDOMEN:abdomen soft, non-tender and normal bowel sounds (+) Surgical  incision has completely healed  Musculoskeletal:no cyanosis of digits and no clubbing  NEURO: alert & oriented x 3 with fluent speech, no focal motor/sensory deficits  LABORATORY DATA:  I have reviewed the data as listed CBC Latest Ref Rng & Units 04/09/2019 03/27/2019 03/12/2019  WBC 4.0 - 10.5 K/uL 6.7 5.1 6.6  Hemoglobin 12.0 - 15.0 g/dL 10.0(L) 9.2(L) 9.0(L)  Hematocrit 36.0 - 46.0 % 31.9(L) 29.2(L) 28.7(L)  Platelets 150 - 400 K/uL 220 192 285     CMP Latest Ref Rng & Units 04/09/2019 03/27/2019 03/12/2019  Glucose 70 - 99 mg/dL 99 108(H) 101(H)  BUN 6 - 20 mg/dL 16 19 14   Creatinine 0.44 - 1.00 mg/dL 1.00 0.92 0.99  Sodium 135 - 145 mmol/L 139 139 140  Potassium 3.5 - 5.1 mmol/L 3.9 4.3 4.3  Chloride 98 - 111 mmol/L 108 107 108  CO2 22 - 32 mmol/L 23 22 25   Calcium 8.9 - 10.3 mg/dL 9.5 9.8 9.6  Total Protein 6.5 - 8.1 g/dL 7.3 7.1 7.0  Total Bilirubin 0.3 - 1.2 mg/dL 0.4 0.3 0.3  Alkaline Phos 38 - 126 U/L 87 82 93  AST 15 - 41 U/L 10(L) 11(L) 20  ALT 0 - 44 U/L 8 8 14       RADIOGRAPHIC STUDIES: I have personally reviewed the radiological images as listed and agreed with the findings in the report. No results found.   ASSESSMENT & PLAN:  Casey Black is a 59 y.o. female with     1. Adenocarcinoma of transverse colon, moderately differentiated, pT4aN1aM1a stage IV with liverand nodalmetastasis, MSS, KRAS G12S(+) -She was recently diagnosed in 01/2019 afteremergent colectomy and liver biopsy. 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 PET from 02/26/19 and discussed the findingswhich showsknown liver metastasis and metastatic lymphadenopathy in chest and right axilla.  -We discussed her cancer is stage IV and no longer eligible for surgery. The likelihood of curing this is very lowdue to her diffuse metastasis, but still very treatable.  -We discussed [name] shows MSI stable disease, Kras+. There are no targetable mutation, she is  not a candidate for EGFR inibitor or immunotherapy. Avastin was added with start of full dose FOLFOX 3 weeks ago.  -I started her on first-line 5-FU for 2 cycles on 03/11/20 while her surgical wound healed. She started full dose FOLFOX with avastin q2weeks on 04/09/19 to control her disease.   -She tolerated full dose FOLFOX and avastin well with no major side effects. She was able to maintain energy and weight.  -Labs reviewed and adequate to proceed with C2 full dose FOLFOX and avastin today. I encouraged her to start watching her BP as avantin can increase this. We reviewed potential side effects from Avastin again   -F/u in 2 weeks and  will order restaging scan on next visit    2. Anorexia  -She reportedly weighed 253 lbs a few years ago, and weighed 203 lbs at symptom onset~11/2018 -190 lbsin 02/2019.  -As she recovers from surgery she is eating more and being more active. Weight is still trending down.  -Continue to f/u withdietician. Weight is stable on chemo so far.   3. Abdominal wound -She has a large open midline incision, closed with wound vac.Wound vac has been removed. She f/u with Dr Linus Salmons. -She has pain with wound care. She takes Oxycodone when this occurs. -Her wound has completely healed now.   4. 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  -Will give IV iron if needed  5. HTN -on hydralazine, isosorbide, lisinopril, metoprolol. Continue medications and f/u with PCP. -I recommend she continue to monitor her BP more closely, may adjust meds if indicated, especially on avastin.   6. Social support -She worked in [employer] at 2 different jobs, but has been out of work lately due to her symptoms  -She is not getting disability, no pay, but her employer kept her on their insurance plan -She may be eligible for Medicaid -She was referred to SW to  discuss   7. Goals of care discussion -The patient understands the goal of care is palliative. -I recommend DNR/DNI, she will think about it  PLAN: -Labs reviewed and adequate to proceed with C2 FOLFOX and avastin  -Lab, flush, f/u and FOLFOX and avastin in 2 and 4 weeks.  -plan to repeat CT after 4 cycles of FOLFOX and AVASTIN    No problem-specific Assessment & Plan notes found for this encounter.   No orders of the defined types were placed in this encounter.  All questions were answered. The patient knows to call the clinic with any problems, questions or concerns. No barriers to learning was detected. The total time spent in the appointment was 30 minutes.     Truitt Merle, MD 04/23/2019   I, Joslyn Devon, am acting as scribe for Truitt Merle, MD.   I have reviewed the above documentation for accuracy and completeness, and I agree with the above.

## 2019-04-23 ENCOUNTER — Inpatient Hospital Stay: Payer: BC Managed Care – PPO

## 2019-04-23 ENCOUNTER — Other Ambulatory Visit: Payer: Self-pay

## 2019-04-23 ENCOUNTER — Inpatient Hospital Stay (HOSPITAL_BASED_OUTPATIENT_CLINIC_OR_DEPARTMENT_OTHER): Payer: BC Managed Care – PPO | Admitting: Hematology

## 2019-04-23 ENCOUNTER — Inpatient Hospital Stay: Payer: BC Managed Care – PPO | Admitting: Nutrition

## 2019-04-23 ENCOUNTER — Telehealth: Payer: Self-pay | Admitting: Hematology

## 2019-04-23 ENCOUNTER — Encounter: Payer: Self-pay | Admitting: Hematology

## 2019-04-23 VITALS — BP 155/61 | HR 41 | Resp 18

## 2019-04-23 VITALS — BP 221/81 | HR 46 | Temp 99.1°F | Resp 16 | Ht 67.0 in | Wt 181.5 lb

## 2019-04-23 DIAGNOSIS — D500 Iron deficiency anemia secondary to blood loss (chronic): Secondary | ICD-10-CM | POA: Diagnosis not present

## 2019-04-23 DIAGNOSIS — Z95828 Presence of other vascular implants and grafts: Secondary | ICD-10-CM

## 2019-04-23 DIAGNOSIS — I10 Essential (primary) hypertension: Secondary | ICD-10-CM

## 2019-04-23 DIAGNOSIS — C189 Malignant neoplasm of colon, unspecified: Secondary | ICD-10-CM | POA: Diagnosis not present

## 2019-04-23 DIAGNOSIS — Z5112 Encounter for antineoplastic immunotherapy: Secondary | ICD-10-CM | POA: Diagnosis not present

## 2019-04-23 DIAGNOSIS — Z5111 Encounter for antineoplastic chemotherapy: Secondary | ICD-10-CM | POA: Diagnosis not present

## 2019-04-23 DIAGNOSIS — Z452 Encounter for adjustment and management of vascular access device: Secondary | ICD-10-CM | POA: Diagnosis not present

## 2019-04-23 DIAGNOSIS — C778 Secondary and unspecified malignant neoplasm of lymph nodes of multiple regions: Secondary | ICD-10-CM | POA: Diagnosis not present

## 2019-04-23 DIAGNOSIS — Z7189 Other specified counseling: Secondary | ICD-10-CM

## 2019-04-23 DIAGNOSIS — C787 Secondary malignant neoplasm of liver and intrahepatic bile duct: Secondary | ICD-10-CM

## 2019-04-23 DIAGNOSIS — C184 Malignant neoplasm of transverse colon: Secondary | ICD-10-CM | POA: Diagnosis not present

## 2019-04-23 LAB — IRON AND TIBC
Iron: 118 ug/dL (ref 41–142)
Saturation Ratios: 41 % (ref 21–57)
TIBC: 290 ug/dL (ref 236–444)
UIBC: 172 ug/dL (ref 120–384)

## 2019-04-23 LAB — CBC WITH DIFFERENTIAL (CANCER CENTER ONLY)
Abs Immature Granulocytes: 0.02 10*3/uL (ref 0.00–0.07)
Basophils Absolute: 0 10*3/uL (ref 0.0–0.1)
Basophils Relative: 1 %
Eosinophils Absolute: 0.3 10*3/uL (ref 0.0–0.5)
Eosinophils Relative: 4 %
HCT: 31.5 % — ABNORMAL LOW (ref 36.0–46.0)
Hemoglobin: 9.9 g/dL — ABNORMAL LOW (ref 12.0–15.0)
Immature Granulocytes: 0 %
Lymphocytes Relative: 40 %
Lymphs Abs: 2.5 10*3/uL (ref 0.7–4.0)
MCH: 26.8 pg (ref 26.0–34.0)
MCHC: 31.4 g/dL (ref 30.0–36.0)
MCV: 85.4 fL (ref 80.0–100.0)
Monocytes Absolute: 0.5 10*3/uL (ref 0.1–1.0)
Monocytes Relative: 8 %
Neutro Abs: 2.9 10*3/uL (ref 1.7–7.7)
Neutrophils Relative %: 47 %
Platelet Count: 184 10*3/uL (ref 150–400)
RBC: 3.69 MIL/uL — ABNORMAL LOW (ref 3.87–5.11)
RDW: 20.6 % — ABNORMAL HIGH (ref 11.5–15.5)
WBC Count: 6.2 10*3/uL (ref 4.0–10.5)
nRBC: 0 % (ref 0.0–0.2)

## 2019-04-23 LAB — CMP (CANCER CENTER ONLY)
ALT: 11 U/L (ref 0–44)
AST: 14 U/L — ABNORMAL LOW (ref 15–41)
Albumin: 3.8 g/dL (ref 3.5–5.0)
Alkaline Phosphatase: 97 U/L (ref 38–126)
Anion gap: 5 (ref 5–15)
BUN: 21 mg/dL — ABNORMAL HIGH (ref 6–20)
CO2: 24 mmol/L (ref 22–32)
Calcium: 9.3 mg/dL (ref 8.9–10.3)
Chloride: 109 mmol/L (ref 98–111)
Creatinine: 0.94 mg/dL (ref 0.44–1.00)
GFR, Est AFR Am: >60 mL/min (ref >60)
GFR, Estimated: >60 mL/min (ref >60)
Glucose, Bld: 90 mg/dL (ref 70–99)
Potassium: 4.8 mmol/L (ref 3.5–5.1)
Sodium: 138 mmol/L (ref 135–145)
Total Bilirubin: <0.2 mg/dL — ABNORMAL LOW (ref 0.3–1.2)
Total Protein: 7.3 g/dL (ref 6.5–8.1)

## 2019-04-23 LAB — FERRITIN: Ferritin: 48 ng/mL (ref 11–307)

## 2019-04-23 LAB — CEA (IN HOUSE-CHCC): CEA (CHCC-In House): 59.69 ng/mL — ABNORMAL HIGH (ref 0.00–5.00)

## 2019-04-23 MED ORDER — CLONIDINE HCL 0.1 MG PO TABS
ORAL_TABLET | ORAL | Status: AC
  Filled 2019-04-23: qty 2

## 2019-04-23 MED ORDER — ALTEPLASE 2 MG IJ SOLR
2.0000 mg | Freq: Once | INTRAMUSCULAR | Status: AC | PRN
  Administered 2019-04-23: 2 mg
  Filled 2019-04-23: qty 2

## 2019-04-23 MED ORDER — PALONOSETRON HCL INJECTION 0.25 MG/5ML
INTRAVENOUS | Status: AC
  Filled 2019-04-23: qty 5

## 2019-04-23 MED ORDER — SODIUM CHLORIDE 0.9 % IV SOLN
INTRAVENOUS | Status: DC
  Filled 2019-04-23: qty 250

## 2019-04-23 MED ORDER — OXALIPLATIN CHEMO INJECTION 100 MG/20ML
85.0000 mg/m2 | Freq: Once | INTRAVENOUS | Status: AC
  Administered 2019-04-23: 170 mg via INTRAVENOUS
  Filled 2019-04-23: qty 34

## 2019-04-23 MED ORDER — DEXAMETHASONE SODIUM PHOSPHATE 10 MG/ML IJ SOLN
10.0000 mg | Freq: Once | INTRAMUSCULAR | Status: AC
  Administered 2019-04-23: 10 mg via INTRAVENOUS

## 2019-04-23 MED ORDER — SODIUM CHLORIDE 0.9 % IV SOLN
5.0000 mg/kg | Freq: Once | INTRAVENOUS | Status: AC
  Administered 2019-04-23: 400 mg via INTRAVENOUS
  Filled 2019-04-23: qty 16

## 2019-04-23 MED ORDER — LEUCOVORIN CALCIUM INJECTION 350 MG
400.0000 mg/m2 | Freq: Once | INTRAVENOUS | Status: AC
  Administered 2019-04-23: 808 mg via INTRAVENOUS
  Filled 2019-04-23: qty 40.4

## 2019-04-23 MED ORDER — PALONOSETRON HCL INJECTION 0.25 MG/5ML
0.2500 mg | Freq: Once | INTRAVENOUS | Status: AC
  Administered 2019-04-23: 0.25 mg via INTRAVENOUS

## 2019-04-23 MED ORDER — DEXAMETHASONE SODIUM PHOSPHATE 10 MG/ML IJ SOLN
INTRAMUSCULAR | Status: AC
  Filled 2019-04-23: qty 1

## 2019-04-23 MED ORDER — CLONIDINE HCL 0.1 MG PO TABS
0.2000 mg | ORAL_TABLET | Freq: Once | ORAL | Status: AC
  Administered 2019-04-23: 0.2 mg via ORAL

## 2019-04-23 MED ORDER — DEXTROSE 5 % IV SOLN
Freq: Once | INTRAVENOUS | Status: AC
  Filled 2019-04-23: qty 250

## 2019-04-23 MED ORDER — ALTEPLASE 2 MG IJ SOLR
INTRAMUSCULAR | Status: AC
  Filled 2019-04-23: qty 2

## 2019-04-23 MED ORDER — SODIUM CHLORIDE 0.9 % IV SOLN
2470.0000 mg/m2 | INTRAVENOUS | Status: DC
  Administered 2019-04-23: 5000 mg via INTRAVENOUS
  Filled 2019-04-23: qty 100

## 2019-04-23 MED ORDER — SODIUM CHLORIDE 0.9% FLUSH
10.0000 mL | INTRAVENOUS | Status: DC | PRN
  Administered 2019-04-23: 10 mL
  Filled 2019-04-23: qty 10

## 2019-04-23 NOTE — Progress Notes (Signed)
Was able to get little blood from port but not enough to get labs.  chasidy LPN tried to get labs from arm but was only to able to get one gold tube. Patient sent back to lab to get labs drawn. Cathflo administered by [employer] RN

## 2019-04-23 NOTE — Progress Notes (Signed)
Nutrition follow-up completed with patient during infusion for metastatic colon cancer. Patient's weight has improved today and was documented as 181.5 pounds increased from 177.4 pounds January 4. Reports improved gas after eliminating gas-forming foods as discussed at our last follow-up. Patient states she needs to drink more water. She has no other nutrition concerns.  Nutrition diagnosis: Inadequate oral intake has improved.  Intervention: Patient to continue strategies for adequate calories and protein throughout the day. She will continue to avoid foods based on tolerance. Oral nutrition supplements as needed.  Monitoring, evaluation, goals: Patient will tolerate adequate calories and protein for weight maintenance throughout treatment.  Next visit: To be scheduled as needed.  **Disclaimer: This note was dictated with voice recognition software. Similar sounding words can inadvertently be transcribed and this note may contain transcription errors which may not have been corrected upon publication of note.**

## 2019-04-23 NOTE — Patient Instructions (Signed)
[facility] Discharge Instructions for Patients Receiving Chemotherapy  Today you received the following chemotherapy agents bevacizumab, flouroucil, oxaliplatin, and leucovorin.  To help prevent nausea and vomiting after your treatment, we encourage you to take your nausea medication as directed.   If you develop nausea and vomiting that is not controlled by your nausea medication, call the clinic.   BELOW ARE SYMPTOMS THAT SHOULD BE REPORTED IMMEDIATELY:  *FEVER GREATER THAN 100.5 F  *CHILLS WITH OR WITHOUT FEVER  NAUSEA AND VOMITING THAT IS NOT CONTROLLED WITH YOUR NAUSEA MEDICATION  *UNUSUAL SHORTNESS OF BREATH  *UNUSUAL BRUISING OR BLEEDING  TENDERNESS IN MOUTH AND THROAT WITH OR WITHOUT PRESENCE OF ULCERS  *URINARY PROBLEMS  *BOWEL PROBLEMS  UNUSUAL RASH Items with * indicate a potential emergency and should be followed up as soon as possible.  Feel free to call the clinic should you have any questions or concerns. The clinic phone number is [phone].  Please show the [place] at check-in to the Emergency Department and triage nurse.

## 2019-04-23 NOTE — Progress Notes (Signed)
Ok to proceed with Oxaliplatin/Leucovorin/Adrucil per MD Burr Medico. Per MD Burr Medico, holding Avastin until BP improves. Clonidine given per MD Burr Medico at 1130. Will continue to monitor.

## 2019-04-23 NOTE — Telephone Encounter (Signed)
Scheduled appt per 1/18 los.

## 2019-04-24 ENCOUNTER — Telehealth: Payer: Self-pay | Admitting: *Deleted

## 2019-04-24 NOTE — Telephone Encounter (Signed)
-----   Message from [name], NP sent at 04/24/2019  9:36 AM EST ----- Please let her know iron studies in normal range, continue oral iron. CEA is mostly stable, will continue monitoring.  Thanks, Regan Rakers

## 2019-04-24 NOTE — Telephone Encounter (Signed)
Called pt & informed of lab results per Cira Rue NP & to continue oral iron.  Pt expressed understanding.

## 2019-04-25 ENCOUNTER — Inpatient Hospital Stay: Payer: BC Managed Care – PPO

## 2019-04-25 ENCOUNTER — Other Ambulatory Visit: Payer: Self-pay | Admitting: Internal Medicine

## 2019-04-25 ENCOUNTER — Other Ambulatory Visit: Payer: Self-pay

## 2019-04-25 VITALS — BP 168/72 | HR 60 | Temp 98.7°F | Resp 18

## 2019-04-25 DIAGNOSIS — Z5112 Encounter for antineoplastic immunotherapy: Secondary | ICD-10-CM | POA: Diagnosis not present

## 2019-04-25 DIAGNOSIS — Z95828 Presence of other vascular implants and grafts: Secondary | ICD-10-CM

## 2019-04-25 DIAGNOSIS — Z452 Encounter for adjustment and management of vascular access device: Secondary | ICD-10-CM | POA: Diagnosis not present

## 2019-04-25 DIAGNOSIS — C787 Secondary malignant neoplasm of liver and intrahepatic bile duct: Secondary | ICD-10-CM | POA: Diagnosis not present

## 2019-04-25 DIAGNOSIS — C778 Secondary and unspecified malignant neoplasm of lymph nodes of multiple regions: Secondary | ICD-10-CM | POA: Diagnosis not present

## 2019-04-25 DIAGNOSIS — C184 Malignant neoplasm of transverse colon: Secondary | ICD-10-CM | POA: Diagnosis not present

## 2019-04-25 DIAGNOSIS — Z5111 Encounter for antineoplastic chemotherapy: Secondary | ICD-10-CM | POA: Diagnosis not present

## 2019-04-25 MED ORDER — HEPARIN SOD (PORK) LOCK FLUSH 100 UNIT/ML IV SOLN
500.0000 [IU] | Freq: Once | INTRAVENOUS | Status: AC | PRN
  Administered 2019-04-25: 500 [IU]
  Filled 2019-04-25: qty 5

## 2019-04-25 MED ORDER — SODIUM CHLORIDE 0.9% FLUSH
10.0000 mL | INTRAVENOUS | Status: DC | PRN
  Administered 2019-04-25: 10 mL
  Filled 2019-04-25: qty 10

## 2019-04-25 MED ORDER — ISOSORBIDE MONONITRATE ER 30 MG PO TB24: 30.0000 mg | ORAL_TABLET | Freq: Every day | ORAL | 0 refills | Status: DC

## 2019-04-25 MED ORDER — HYDRALAZINE HCL 25 MG PO TABS: 25.0000 mg | ORAL_TABLET | Freq: Three times a day (TID) | ORAL | 0 refills | Status: DC

## 2019-04-25 NOTE — Telephone Encounter (Signed)
Medication Refill - Medication: hydrALAZINE (APRESOLINE) 25 MG tablet [id number]   isosorbide mononitrate (IMDUR) 30 MG 24    Has the patient contacted their pharmacy? No. (Agent: If no, request that the patient contact the pharmacy for the refill.) (Agent: If yes, when and what did the pharmacy advise?)  Preferred Pharmacy (with phone number or street name): [facility] [address]   Agent: Please be advised that RX refills may take up to 3 business days. We ask that you follow-up with your pharmacy.

## 2019-04-25 NOTE — Patient Instructions (Signed)

## 2019-04-25 NOTE — Telephone Encounter (Signed)
Message Routed to PCP CMA 

## 2019-04-25 NOTE — Telephone Encounter (Signed)
Hydralazine last filled by Truitt Merle, MD

## 2019-05-02 NOTE — Progress Notes (Signed)
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 Bowel-gas pattern consistent with small bowel obstruction. No free air. 2. No acute chest findings.   01/23/2019 Imaging   CT AP IMPRESSION: Obstructing mid transverse colonic mass with mild regional adenopathy and hepatic metastatic disease. The mass likely extends through the serosa; no ascites or peritoneal nodularity.   01/24/2019 Surgery   Surgeon: Clovis Riley MD Assistant: [name] Procedure performed: Transverse colectomy with end colostomy, liver biopsy Procedure classification: URGENT/EMERGENT Preop diagnosis: Obstructing, metastatic transverse colon mass Post-op diagnosis/intraop findings: Same   01/24/2019 Pathology Results   FINAL MICROSCOPIC DIAGNOSIS:   A. COLON, TRANSVERSE, RESECTION:  Colonic adenocarcinoma, 5 cm.  Carcinoma extends into pericolonic connective tissue and focally to  serosal surface.  Margins not involved.  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   B. LIVER NODULE, LEFT, BIOPSY:  Metastatic adenocarcinoma.    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 Hypermetabolic metastatic disease in the liver and mediastinal/hilar/axillary lymph nodes. 2. Focal hypermetabolism in the rectum. Continued attention on follow-up exams is warranted. 3. Focal hypermetabolism medial to the right adrenal gland may be within a metastatic lymph node, better visualized on 01/23/2019. 4. Aortic atherosclerosis (ICD10-170.0). Coronary artery calcification.   03/12/2019 -  Chemotherapy   She started 5FU q2weeks on 03/12/19 for 2 cycles. She started full dose FOLFOX with Avastin on 04/09/19.      CURRENT THERAPY:  She started 5FU q2weeks on 03/12/19 for 2 cycles. She started full dose FOLFOX with Avastin on 04/09/19.   INTERVAL HISTORY:  Casey Black is here for a follow up and treatment. She presents to the clinic alone. She notes she is doing well. She denies any issues from chemo except for mild nausea after infusion. She took antiemetics which helped. She has been able to maintain her weight. She notes her BP is normal at home but elevated in clinic.  She notes she will see her PCP on 2/16.     REVIEW OF SYSTEMS:   Constitutional: Denies fevers, chills or abnormal weight loss Eyes: Denies blurriness of vision Ears, nose, mouth, throat, and face: Denies mucositis or sore throat Respiratory: Denies cough, dyspnea or wheezes Cardiovascular: Denies palpitation, chest discomfort or lower extremity swelling Gastrointestinal:  Denies nausea, heartburn or change in bowel habits Skin: Denies abnormal skin rashes Lymphatics: Denies new lymphadenopathy or easy bruising Neurological:Denies numbness, tingling or new weaknesses Behavioral/Psych: Mood is stable, no new changes  All other systems were reviewed with the  patient and are negative.  MEDICAL HISTORY:  Past Medical History:  Diagnosis Date  . Anemia    low iron  . Cancer [facility])    colon cancer  . Hypertension 01/23/2019  . SBO (small bowel obstruction) ([place]) 01/23/2019    SURGICAL HISTORY: Past Surgical History:  Procedure Laterality Date  . CESAREAN SECTION     x2  . COLOSTOMY N/A 01/24/2019   Procedure: End Loop Colostomy;  Surgeon: Clovis Riley, MD;  Location: [place];  Service: General;  Laterality: N/A;  . PARTIAL COLECTOMY N/A 01/24/2019   Procedure: PARTIAL COLECTOMY;  Surgeon: Clovis Riley, MD;  Location: [place];  Service: General;  Laterality: N/A;  . PORTACATH PLACEMENT Right 02/28/2019   Procedure: INSERTION PORT-A-CATH WITH ULTRASOUND GUIDANCE;  Surgeon: Clovis Riley, MD;  Location: [place];  Service: General;  Laterality: Right;    I have reviewed the social history and family history with the patient and they are unchanged from previous note.  ALLERGIES:  has No Known Allergies.  MEDICATIONS:  Current Outpatient Medications  Medication Sig Dispense Refill  . acetaminophen (TYLENOL) 325 MG tablet Take 2 tablets (650 mg total) by mouth every 6 (six) hours as needed.    . ferrous sulfate 325 (65 FE) MG tablet Take 1 tablet (325 mg total) by mouth 2 (two) times daily with a meal. 60 tablet 3  . hydrALAZINE (APRESOLINE) 25 MG tablet Take 1 tablet (25 mg total) by mouth every 8 (eight) hours. 90 tablet 0  . Ibuprofen (ADVIL) 200 MG CAPS Take 400 mg by mouth daily as needed (pain).    . isosorbide mononitrate (IMDUR) 30 MG 24 hr tablet Take 1 tablet (30 mg total) by mouth daily. 90 tablet 0  . lidocaine-prilocaine (EMLA) cream Apply to affected area once (Patient taking differently: Apply 1 application topically once. Apply to affected area once) 30 g 3  . lisinopril (ZESTRIL) 40 MG tablet Take 1 tablet (40 mg total) by mouth daily. 30 tablet 0  . loperamide (IMODIUM) 2 MG capsule Take 1 capsule (2 mg total) by  mouth as needed for diarrhea or loose stools. 90 capsule 0  . metoprolol tartrate (LOPRESSOR) 50 MG tablet Take 1 tablet (50 mg total) by mouth 2 (two) times daily. 60 tablet 0  . nicotine (NICODERM CQ - DOSED IN MG/24 HR) 7 mg/24hr patch Place 1 patch (7 mg total) onto the skin daily. 7 patch 0  . ondansetron (ZOFRAN) 4 MG tablet Take 1 tablet (4 mg total) by mouth every 6 (six) hours as needed for nausea. 30 tablet 0  . ondansetron (ZOFRAN) 8 MG tablet Take 1 tablet (8 mg total) by mouth 2 (two) times daily as needed for refractory nausea / vomiting. Start on day 3 after chemotherapy. 30 tablet 1  . oxyCODONE 10 MG TABS Take 0.5-1 tablets (5-10 mg total) by mouth every 4 (four) hours as needed for moderate pain or severe pain (5 moderate, 10 severe). 20 tablet 0  . polycarbophil (FIBERCON) 625 MG tablet Take 1 tablet (625 mg total) by mouth daily. 30 tablet 0  . prochlorperazine (COMPAZINE) 10 MG tablet Take 1 tablet (10 mg total) by mouth every 6 (six) hours as needed (Nausea or vomiting). 30 tablet 1   No current facility-administered medications for this visit.   Facility-Administered Medications Ordered in Other Visits  Medication Dose Route Frequency Provider Last Rate Last Admin  . 0.9 %  sodium chloride infusion   Intravenous Continuous Truitt Merle, MD   Stopped at 05/07/19 1153  . fluorouracil (ADRUCIL) 5,000 mg in sodium chloride 0.9 % 150 mL chemo infusion  2,470 mg/m2 (Treatment Plan Recorded) Intravenous 1 day or 1 dose Truitt Merle, MD   5,000 mg at 05/07/19 1413  . heparin lock flush 100 unit/mL  500 Units Intracatheter Once PRN Truitt Merle, MD      . sodium chloride flush (NS) 0.9 % injection 10 mL  10 mL Intracatheter PRN Truitt Merle, MD        PHYSICAL EXAMINATION: ECOG PERFORMANCE STATUS: 1 - Symptomatic but completely ambulatory  Vitals:   05/07/19 0950 05/07/19 0953  BP: (!) 168/94   Pulse: (!) 53 (!) 55  Resp: 17   Temp: 98.5 F (36.9 C)   SpO2: 100%    Filed Weights    05/07/19 0950  Weight: 182 lb 4.8 oz (82.7 kg)     Due to COVID19 we will limit examination to appearance. Patient had no complaints.  GENERAL:alert, no distress and comfortable SKIN: skin color normal, no rashes or significant lesions EYES: normal, Conjunctiva are pink and non-injected, sclera clear  NEURO: alert & oriented x 3 with fluent speech   LABORATORY DATA:  I have reviewed the data as listed CBC Latest Ref Rng & Units 05/07/2019 04/23/2019 04/09/2019  WBC 4.0 - 10.5 K/uL 6.1 6.2 6.7  Hemoglobin 12.0 - 15.0 g/dL 10.6(L) 9.9(L) 10.0(L)  Hematocrit 36.0 - 46.0 % 33.1(L) 31.5(L) 31.9(L)  Platelets 150 - 400 K/uL 166 184 220     CMP Latest Ref Rng & Units 05/07/2019 04/23/2019 04/09/2019  Glucose 70 - 99 mg/dL 100(H) 90 99  BUN 6 - 20 mg/dL 18 21(H) 16  Creatinine 0.44 - 1.00 mg/dL 0.81 0.94 1.00  Sodium 135 - 145 mmol/L 137 138 139  Potassium 3.5 - 5.1 mmol/L 4.2 4.8 3.9  Chloride 98 - 111 mmol/L 107 109 108  CO2 22 - 32 mmol/L 24 24 23   Calcium 8.9 - 10.3 mg/dL 9.4 9.3 9.5  Total Protein 6.5 - 8.1 g/dL 7.3 7.3 7.3  Total Bilirubin 0.3 - 1.2 mg/dL 0.3 <0.2(L) 0.4  Alkaline Phos 38 - 126 U/L 88 97 87  AST 15 - 41 U/L 14(L) 14(L) 10(L)  ALT 0 - 44 U/L 12 11 8       RADIOGRAPHIC STUDIES: I have personally reviewed the radiological images as listed and agreed with the findings in the report. No results found.   ASSESSMENT & PLAN:  Casey Black is a 59 y.o. female with    1. Adenocarcinoma of transverse colon, moderately differentiated, pT4aN1aM1a stage IV with liverand nodalmetastasis,MSS, KRAS G12S(+) -Shewasrecently diagnosed in 01/2019 afteremergent colectomy and liver biopsy. 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 PET from 02/26/19 and discussed the findingswhich showsknown liver metastasis and metastatic lymphadenopathy in chest and right axilla.  -We discussed her cancer is stage IV and no longer eligible for surgery. The  likelihood of curing this is very lowdue to her diffuse metastasis, but still very treatable. -We discussed [name] shows MSI stable disease, Kras+. There are no targetable mutation, she is not a candidate for EGFRinhibitoror immunotherapy. Avastin was added with start of full dose FOLFOX 3 weeks ago.  -I started her on first-line 5-FU for 2 cycles on 03/11/20 while her surgical wound healed. She started full dose FOLFOX with avastin q2weeks on 04/09/19 to control her disease.   -S/p C4 she  has been tolerating well overall with mild anemia, cold sensitivity and elevation in BP at clinic. Labs reviewed, and adequate to proceed with C5 FOLFOX and avastin today  -Plan to scan her after C6.  -F/u in 2 weeks    2. Anorexia, improved  -She reportedly weighed 253 lbs a few years ago, and weighed 203 lbs at symptom onset~11/2018 -190 lbsin 02/2019.  -Asshe recovers from surgery she is eating more and being more active. -Continue to f/u withdietician. Weight is stable on chemo so far.   3. Abdominal wound -She had a large open midline incision, closed with wound vac.Wound vac has been removed.Her wound has completely healed now. She was seen by Dr. Linus Salmons.   4. 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 -Will give IV iron if needed  5. HTN -on hydralazine, isosorbide, lisinopril, metoprolol. Continue medications and f/u with PCP. -I recommend she continue to monitor her BP more closely, may adjust meds if indicated, especially on avastin.  -Per PT her BP is normal at home, but has been elevated in clinic. If increases at home may have to adjust her HTN medications. She plans to f/u with her PCP on 2/16.   6. Social support -She worked in [employer] at 2 different jobs, but has been out of work lately due to her symptoms  -She is not getting disability, no pay,  but her employer kept her on their insurance plan -She may be eligible for Medicaid -She was referred to SW to discuss   7. Goals of care discussion -The patient understands the goal of care is palliative. -I recommend DNR/DNI, she will think about it  PLAN: -Labs reviewed and adequate to proceed with C5 FOLFOX and avastin  -Lab, flush, f/u and FOLFOXand avastinin 2 and 4 weeks.  -I encouraged her to f/u with PCP for her HTN management    No problem-specific Assessment & Plan notes found for this encounter.   No orders of the defined types were placed in this encounter.  All questions were answered. The patient knows to call the clinic with any problems, questions or concerns. No barriers to learning was detected. The total time spent in the appointment was 30 minutes.     Truitt Merle, MD 05/07/2019   I, Joslyn Devon, am acting as scribe for Truitt Merle, MD.   I have reviewed the above documentation for accuracy and completeness, and I agree with the above.

## 2019-05-06 ENCOUNTER — Other Ambulatory Visit: Payer: Self-pay | Admitting: Internal Medicine

## 2019-05-07 ENCOUNTER — Other Ambulatory Visit: Payer: Self-pay

## 2019-05-07 ENCOUNTER — Inpatient Hospital Stay: Payer: BC Managed Care – PPO

## 2019-05-07 ENCOUNTER — Inpatient Hospital Stay (HOSPITAL_BASED_OUTPATIENT_CLINIC_OR_DEPARTMENT_OTHER): Payer: BC Managed Care – PPO | Admitting: Hematology

## 2019-05-07 ENCOUNTER — Encounter: Payer: Self-pay | Admitting: Hematology

## 2019-05-07 ENCOUNTER — Inpatient Hospital Stay: Payer: BC Managed Care – PPO | Attending: Nurse Practitioner

## 2019-05-07 ENCOUNTER — Encounter: Payer: BC Managed Care – PPO | Admitting: Nutrition

## 2019-05-07 VITALS — BP 174/68 | HR 54

## 2019-05-07 VITALS — BP 168/94 | HR 55 | Temp 98.5°F | Resp 17 | Ht 67.0 in | Wt 182.3 lb

## 2019-05-07 DIAGNOSIS — C787 Secondary malignant neoplasm of liver and intrahepatic bile duct: Secondary | ICD-10-CM | POA: Insufficient documentation

## 2019-05-07 DIAGNOSIS — I10 Essential (primary) hypertension: Secondary | ICD-10-CM

## 2019-05-07 DIAGNOSIS — C189 Malignant neoplasm of colon, unspecified: Secondary | ICD-10-CM

## 2019-05-07 DIAGNOSIS — C778 Secondary and unspecified malignant neoplasm of lymph nodes of multiple regions: Secondary | ICD-10-CM | POA: Insufficient documentation

## 2019-05-07 DIAGNOSIS — C184 Malignant neoplasm of transverse colon: Secondary | ICD-10-CM | POA: Diagnosis not present

## 2019-05-07 DIAGNOSIS — R110 Nausea: Secondary | ICD-10-CM | POA: Insufficient documentation

## 2019-05-07 DIAGNOSIS — Z7189 Other specified counseling: Secondary | ICD-10-CM

## 2019-05-07 DIAGNOSIS — Z5111 Encounter for antineoplastic chemotherapy: Secondary | ICD-10-CM | POA: Diagnosis not present

## 2019-05-07 DIAGNOSIS — Z5112 Encounter for antineoplastic immunotherapy: Secondary | ICD-10-CM | POA: Diagnosis not present

## 2019-05-07 DIAGNOSIS — Z95828 Presence of other vascular implants and grafts: Secondary | ICD-10-CM

## 2019-05-07 LAB — CMP (CANCER CENTER ONLY)
ALT: 12 U/L (ref 0–44)
AST: 14 U/L — ABNORMAL LOW (ref 15–41)
Albumin: 3.8 g/dL (ref 3.5–5.0)
Alkaline Phosphatase: 88 U/L (ref 38–126)
Anion gap: 6 (ref 5–15)
BUN: 18 mg/dL (ref 6–20)
CO2: 24 mmol/L (ref 22–32)
Calcium: 9.4 mg/dL (ref 8.9–10.3)
Chloride: 107 mmol/L (ref 98–111)
Creatinine: 0.81 mg/dL (ref 0.44–1.00)
GFR, Est AFR Am: >60 mL/min (ref >60)
GFR, Estimated: >60 mL/min (ref >60)
Glucose, Bld: 100 mg/dL — ABNORMAL HIGH (ref 70–99)
Potassium: 4.2 mmol/L (ref 3.5–5.1)
Sodium: 137 mmol/L (ref 135–145)
Total Bilirubin: 0.3 mg/dL (ref 0.3–1.2)
Total Protein: 7.3 g/dL (ref 6.5–8.1)

## 2019-05-07 LAB — CBC WITH DIFFERENTIAL (CANCER CENTER ONLY)
Abs Immature Granulocytes: 0.01 10*3/uL (ref 0.00–0.07)
Basophils Absolute: 0 10*3/uL (ref 0.0–0.1)
Basophils Relative: 1 %
Eosinophils Absolute: 0.2 10*3/uL (ref 0.0–0.5)
Eosinophils Relative: 3 %
HCT: 33.1 % — ABNORMAL LOW (ref 36.0–46.0)
Hemoglobin: 10.6 g/dL — ABNORMAL LOW (ref 12.0–15.0)
Immature Granulocytes: 0 %
Lymphocytes Relative: 34 %
Lymphs Abs: 2.1 10*3/uL (ref 0.7–4.0)
MCH: 27.6 pg (ref 26.0–34.0)
MCHC: 32 g/dL (ref 30.0–36.0)
MCV: 86.2 fL (ref 80.0–100.0)
Monocytes Absolute: 0.4 10*3/uL (ref 0.1–1.0)
Monocytes Relative: 7 %
Neutro Abs: 3.4 10*3/uL (ref 1.7–7.7)
Neutrophils Relative %: 55 %
Platelet Count: 166 10*3/uL (ref 150–400)
RBC: 3.84 MIL/uL — ABNORMAL LOW (ref 3.87–5.11)
RDW: 20 % — ABNORMAL HIGH (ref 11.5–15.5)
WBC Count: 6.1 10*3/uL (ref 4.0–10.5)
nRBC: 0 % (ref 0.0–0.2)

## 2019-05-07 MED ORDER — SODIUM CHLORIDE 0.9 % IV SOLN
2470.0000 mg/m2 | INTRAVENOUS | Status: DC
  Administered 2019-05-07: 5000 mg via INTRAVENOUS
  Filled 2019-05-07: qty 100

## 2019-05-07 MED ORDER — LEUCOVORIN CALCIUM INJECTION 350 MG
400.0000 mg/m2 | Freq: Once | INTRAVENOUS | Status: AC
  Administered 2019-05-07: 808 mg via INTRAVENOUS
  Filled 2019-05-07: qty 40.4

## 2019-05-07 MED ORDER — OXALIPLATIN CHEMO INJECTION 100 MG/20ML
85.0000 mg/m2 | Freq: Once | INTRAVENOUS | Status: AC
  Administered 2019-05-07: 12:00:00 170 mg via INTRAVENOUS
  Filled 2019-05-07: qty 34

## 2019-05-07 MED ORDER — SODIUM CHLORIDE 0.9 % IV SOLN
INTRAVENOUS | Status: DC
  Filled 2019-05-07: qty 250

## 2019-05-07 MED ORDER — PALONOSETRON HCL INJECTION 0.25 MG/5ML
INTRAVENOUS | Status: AC
  Filled 2019-05-07: qty 5

## 2019-05-07 MED ORDER — DEXAMETHASONE SODIUM PHOSPHATE 10 MG/ML IJ SOLN
INTRAMUSCULAR | Status: AC
  Filled 2019-05-07: qty 1

## 2019-05-07 MED ORDER — SODIUM CHLORIDE 0.9% FLUSH
10.0000 mL | INTRAVENOUS | Status: DC | PRN
  Filled 2019-05-07: qty 10

## 2019-05-07 MED ORDER — DEXTROSE 5 % IV SOLN
Freq: Once | INTRAVENOUS | Status: AC
  Filled 2019-05-07: qty 250

## 2019-05-07 MED ORDER — SODIUM CHLORIDE 0.9 % IV SOLN
5.0000 mg/kg | Freq: Once | INTRAVENOUS | Status: AC
  Administered 2019-05-07: 12:00:00 400 mg via INTRAVENOUS
  Filled 2019-05-07: qty 16

## 2019-05-07 MED ORDER — SODIUM CHLORIDE 0.9% FLUSH
10.0000 mL | INTRAVENOUS | Status: DC | PRN
  Administered 2019-05-07: 10 mL
  Filled 2019-05-07: qty 10

## 2019-05-07 MED ORDER — PALONOSETRON HCL INJECTION 0.25 MG/5ML
0.2500 mg | Freq: Once | INTRAVENOUS | Status: AC
  Administered 2019-05-07: 0.25 mg via INTRAVENOUS

## 2019-05-07 MED ORDER — DEXAMETHASONE SODIUM PHOSPHATE 10 MG/ML IJ SOLN
10.0000 mg | Freq: Once | INTRAMUSCULAR | Status: AC
  Administered 2019-05-07: 11:00:00 10 mg via INTRAVENOUS

## 2019-05-07 MED ORDER — HEPARIN SOD (PORK) LOCK FLUSH 100 UNIT/ML IV SOLN
500.0000 [IU] | Freq: Once | INTRAVENOUS | Status: DC | PRN
  Filled 2019-05-07: qty 5

## 2019-05-07 NOTE — Patient Instructions (Signed)
[facility] Discharge Instructions for Patients Receiving Chemotherapy  Today you received the following chemotherapy agents bevacizumab, flouroucil, oxaliplatin, and leucovorin.  To help prevent nausea and vomiting after your treatment, we encourage you to take your nausea medication as directed.   If you develop nausea and vomiting that is not controlled by your nausea medication, call the clinic.   BELOW ARE SYMPTOMS THAT SHOULD BE REPORTED IMMEDIATELY:  *FEVER GREATER THAN 100.5 F  *CHILLS WITH OR WITHOUT FEVER  NAUSEA AND VOMITING THAT IS NOT CONTROLLED WITH YOUR NAUSEA MEDICATION  *UNUSUAL SHORTNESS OF BREATH  *UNUSUAL BRUISING OR BLEEDING  TENDERNESS IN MOUTH AND THROAT WITH OR WITHOUT PRESENCE OF ULCERS  *URINARY PROBLEMS  *BOWEL PROBLEMS  UNUSUAL RASH Items with * indicate a potential emergency and should be followed up as soon as possible.  Feel free to call the clinic should you have any questions or concerns. The clinic phone number is [phone].  Please show the [place] at check-in to the Emergency Department and triage nurse.

## 2019-05-07 NOTE — Progress Notes (Signed)
Per Dr. Burr Medico ok to give avastin today. Use 04/09/2019 urine results.

## 2019-05-07 NOTE — Patient Instructions (Signed)

## 2019-05-09 ENCOUNTER — Inpatient Hospital Stay: Payer: BC Managed Care – PPO

## 2019-05-09 ENCOUNTER — Other Ambulatory Visit: Payer: Self-pay

## 2019-05-09 VITALS — BP 154/66 | HR 61 | Resp 18

## 2019-05-09 DIAGNOSIS — Z7189 Other specified counseling: Secondary | ICD-10-CM

## 2019-05-09 DIAGNOSIS — C184 Malignant neoplasm of transverse colon: Secondary | ICD-10-CM | POA: Diagnosis not present

## 2019-05-09 DIAGNOSIS — Z5111 Encounter for antineoplastic chemotherapy: Secondary | ICD-10-CM | POA: Diagnosis not present

## 2019-05-09 DIAGNOSIS — Z5112 Encounter for antineoplastic immunotherapy: Secondary | ICD-10-CM | POA: Diagnosis not present

## 2019-05-09 DIAGNOSIS — C189 Malignant neoplasm of colon, unspecified: Secondary | ICD-10-CM

## 2019-05-09 DIAGNOSIS — C778 Secondary and unspecified malignant neoplasm of lymph nodes of multiple regions: Secondary | ICD-10-CM | POA: Diagnosis not present

## 2019-05-09 DIAGNOSIS — C787 Secondary malignant neoplasm of liver and intrahepatic bile duct: Secondary | ICD-10-CM | POA: Diagnosis not present

## 2019-05-09 DIAGNOSIS — R110 Nausea: Secondary | ICD-10-CM | POA: Diagnosis not present

## 2019-05-09 MED ORDER — SODIUM CHLORIDE 0.9% FLUSH
10.0000 mL | INTRAVENOUS | Status: DC | PRN
  Administered 2019-05-09: 10 mL
  Filled 2019-05-09: qty 10

## 2019-05-09 MED ORDER — HEPARIN SOD (PORK) LOCK FLUSH 100 UNIT/ML IV SOLN
500.0000 [IU] | Freq: Once | INTRAVENOUS | Status: AC | PRN
  Administered 2019-05-09: 12:00:00 500 [IU]
  Filled 2019-05-09: qty 5

## 2019-05-10 DIAGNOSIS — Z933 Colostomy status: Secondary | ICD-10-CM | POA: Diagnosis not present

## 2019-05-13 DIAGNOSIS — C189 Malignant neoplasm of colon, unspecified: Secondary | ICD-10-CM | POA: Diagnosis not present

## 2019-05-14 NOTE — Progress Notes (Signed)
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 Bowel-gas pattern consistent with small bowel obstruction. No free air. 2. No acute chest findings.   01/23/2019 Imaging   CT AP IMPRESSION: Obstructing mid transverse colonic mass with mild regional adenopathy and hepatic metastatic disease. The mass likely extends through the serosa; no ascites or peritoneal nodularity.   01/24/2019 Surgery   Surgeon: Clovis Riley MD Assistant: [name] Procedure performed: Transverse colectomy with end colostomy, liver biopsy Procedure classification: URGENT/EMERGENT Preop diagnosis: Obstructing, metastatic transverse colon mass Post-op diagnosis/intraop findings: Same   01/24/2019 Pathology Results   FINAL MICROSCOPIC DIAGNOSIS:   A. COLON, TRANSVERSE, RESECTION:  Colonic adenocarcinoma, 5 cm.  Carcinoma extends into pericolonic connective tissue and focally to  serosal surface.  Margins not involved.  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   B. LIVER NODULE, LEFT, BIOPSY:  Metastatic adenocarcinoma.    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 Hypermetabolic metastatic disease in the liver and mediastinal/hilar/axillary lymph nodes. 2. Focal hypermetabolism in the rectum. Continued attention on follow-up exams is warranted. 3. Focal hypermetabolism medial to the right adrenal gland may be within a metastatic lymph node, better visualized on 01/23/2019. 4. Aortic atherosclerosis (ICD10-170.0). Coronary artery calcification.   03/12/2019 -  Chemotherapy   She started 5FU q2weeks on 03/12/19 for 2 cycles. She started full dose FOLFOX with Avastin on 04/09/19.      CURRENT THERAPY:  She started 5FU q2weeks on 03/12/19 for 2 cycles. She started full dose FOLFOX with Avastin on 04/09/19.  INTERVAL HISTORY:  Casey Black is here for a follow up and treatment. She presents to the clinic alone. She notes she is doing well. She notes no major issues with her last cycle. She denies neuropathy but notes stiffness of her calves. She has been able to walk around the park. She notes her ostomy and surgical incision healed well. Her stool output has been normal. She plans to see her PCP in 06/2019. She notes her right side BP is higher than her left. She denies HA's or chest discomfort. I reviewed her medication list with her.    REVIEW OF SYSTEMS:   Constitutional: Denies fevers, chills or abnormal weight loss Eyes: Denies blurriness of vision Ears, nose, mouth, throat, and face: Denies mucositis or sore throat Respiratory: Denies cough, dyspnea or wheezes Cardiovascular: Denies palpitation, chest discomfort or lower extremity swelling Gastrointestinal:  Denies nausea, heartburn or change in bowel habits  Skin: Denies abnormal skin rashes Lymphatics: Denies new lymphadenopathy or easy bruising  Neurological:Denies numbness, tingling or new weaknesses Behavioral/Psych: Mood is stable, no new changes  All other systems were reviewed with the patient and are negative.  MEDICAL HISTORY:  Past Medical History:  Diagnosis Date  . Anemia    low iron  . Cancer [facility])    colon cancer  . Hypertension 01/23/2019  . SBO (small bowel obstruction) ([place]) 01/23/2019    SURGICAL HISTORY: Past Surgical History:  Procedure Laterality Date  . CESAREAN SECTION     x2  . COLOSTOMY N/A 01/24/2019   Procedure: End Loop Colostomy;  Surgeon: Clovis Riley, MD;  Location: [place];  Service: General;  Laterality: N/A;  . PARTIAL COLECTOMY N/A 01/24/2019   Procedure: PARTIAL COLECTOMY;  Surgeon: Clovis Riley, MD;  Location: [place];  Service: General;  Laterality: N/A;  . PORTACATH PLACEMENT Right 02/28/2019   Procedure: INSERTION PORT-A-CATH WITH ULTRASOUND GUIDANCE;  Surgeon: Clovis Riley, MD;  Location: [place];  Service: General;  Laterality: Right;    I have reviewed the social history and family history with the patient and they are unchanged from previous note.  ALLERGIES:  has No Known Allergies.  MEDICATIONS:  Current Outpatient Medications  Medication Sig Dispense Refill  . acetaminophen (TYLENOL) 325 MG tablet Take 2 tablets (650 mg total) by mouth every 6 (six) hours as needed.    . ferrous sulfate 325 (65 FE) MG tablet Take 1 tablet (325 mg total) by mouth 2 (two) times daily with a meal. 60 tablet 3  . hydrALAZINE (APRESOLINE) 25 MG tablet Take 1 tablet (25 mg total) by mouth every 8 (eight) hours. 90 tablet 0  . Ibuprofen (ADVIL) 200 MG CAPS Take 400 mg by mouth daily as needed (pain).    . isosorbide mononitrate (IMDUR) 30 MG 24 hr tablet Take 1 tablet (30 mg total) by mouth daily. 90 tablet 0  . lidocaine-prilocaine (EMLA) cream Apply to affected area once (Patient taking differently: Apply 1 application topically once. Apply to affected area once) 30 g 3  . lisinopril  (ZESTRIL) 40 MG tablet TAKE 1 TABLET BY MOUTH EVERY DAY 90 tablet 1  . loperamide (IMODIUM) 2 MG capsule Take 1 capsule (2 mg total) by mouth as needed for diarrhea or loose stools. 90 capsule 0  . metoprolol tartrate (LOPRESSOR) 50 MG tablet TAKE 1 TABLET BY MOUTH TWICE A DAY 180 tablet 1  . nicotine (NICODERM CQ - DOSED IN MG/24 HR) 7 mg/24hr patch Place 1 patch (7 mg total) onto the skin daily. 7 patch 0  . ondansetron (ZOFRAN) 4 MG tablet Take 1 tablet (4 mg total) by mouth every 6 (six) hours as needed for nausea. 30 tablet 0  . ondansetron (ZOFRAN) 8 MG tablet Take 1 tablet (8 mg total) by mouth 2 (two) times daily as needed for refractory nausea / vomiting. Start on day 3 after chemotherapy. 30 tablet 1  . oxyCODONE 10 MG TABS Take 0.5-1 tablets (5-10 mg total) by mouth every 4 (four) hours as needed for moderate pain or severe pain (5 moderate, 10 severe). 20 tablet 0  . polycarbophil (FIBERCON) 625 MG tablet Take 1 tablet (625 mg total) by mouth daily. 30 tablet 0  . prochlorperazine (COMPAZINE) 10 MG tablet Take 1 tablet (10 mg total) by mouth every 6 (six) hours as needed (Nausea or vomiting). 30 tablet 1   No current facility-administered medications for this visit.  PHYSICAL EXAMINATION: ECOG PERFORMANCE STATUS: 1 - Symptomatic but completely ambulatory  Vitals:   05/21/19 0911 05/21/19 0912  BP: (!) 205/64 139/72  Pulse: (!) 53   Resp: 17   Temp: 98.3 F (36.8 C)   SpO2: 100%    Filed Weights   05/21/19 0911  Weight: 184 lb 9.6 oz (83.7 kg)    GENERAL:alert, no distress and comfortable SKIN: skin color, texture, turgor are normal, no rashes or significant lesions EYES: normal, Conjunctiva are pink and non-injected, sclera clear  NECK: supple, thyroid normal size, non-tender, without nodularity LYMPH:  no palpable lymphadenopathy in the cervical, axillary  LUNGS: clear to auscultation and percussion with normal breathing effort HEART: regular rate & rhythm and no  murmurs and no lower extremity edema ABDOMEN:abdomen soft, non-tender and normal bowel sounds Musculoskeletal:no cyanosis of digits and no clubbing  NEURO: alert & oriented x 3 with fluent speech, no focal motor/sensory deficits  LABORATORY DATA:  I have reviewed the data as listed CBC Latest Ref Rng & Units 05/21/2019 05/07/2019 04/23/2019  WBC 4.0 - 10.5 K/uL 5.2 6.1 6.2  Hemoglobin 12.0 - 15.0 g/dL 10.8(L) 10.6(L) 9.9(L)  Hematocrit 36.0 - 46.0 % 34.1(L) 33.1(L) 31.5(L)  Platelets 150 - 400 K/uL 152 166 184     CMP Latest Ref Rng & Units 05/21/2019 05/07/2019 04/23/2019  Glucose 70 - 99 mg/dL 100(H) 100(H) 90  BUN 6 - 20 mg/dL 19 18 21(H)  Creatinine 0.44 - 1.00 mg/dL 0.81 0.81 0.94  Sodium 135 - 145 mmol/L 141 137 138  Potassium 3.5 - 5.1 mmol/L 4.1 4.2 4.8  Chloride 98 - 111 mmol/L 111 107 109  CO2 22 - 32 mmol/L 22 24 24   Calcium 8.9 - 10.3 mg/dL 9.2 9.4 9.3  Total Protein 6.5 - 8.1 g/dL 7.2 7.3 7.3  Total Bilirubin 0.3 - 1.2 mg/dL <0.2(L) 0.3 <0.2(L)  Alkaline Phos 38 - 126 U/L 95 88 97  AST 15 - 41 U/L 20 14(L) 14(L)  ALT 0 - 44 U/L 15 12 11       RADIOGRAPHIC STUDIES: I have personally reviewed the radiological images as listed and agreed with the findings in the report. No results found.   ASSESSMENT & PLAN:  Casey Black is a 59 y.o. female with    1. Adenocarcinoma of transverse colon, moderately differentiated, pT4aN1aM1a stage IV with liverand nodalmetastasis,MSS, KRAS G12S(+) -Shewas diagnosed in 01/2019 afteremergent colectomy and liver biopsy. 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 02/26/19 PET showsknown liver metastasis and metastatic lymphadenopathy in chest and right axilla.  -We previously discussed her cancer is stage IV and no longer eligible for surgery. The likelihood of curing this is very lowdue to her diffuse metastasis, but still very treatable. -[name] shows MSI stable disease, Kras+. There are no  targetable mutation, she is not a candidate for EGFRinhibitoror immunotherapy.Avastin was added with start of full dose FOLFOX3 weeks ago. -I started her on first-line5-FU for 2 cycles on 03/11/20 while her surgical wound healed. She started full doseFOLFOXwith avastin q2weeks on 1/4/21to control her disease. -S/p C5 she continues to tolerate well with no neuropathy and adequate eating and strength. Her surgical incision has completely healed. Labs reviewed, with mild anemia. CEA is still pending. Overall labs adequate for her to proceed with C6 FOLFOX and avastin today. Will proceed with Urine protein lab today.  -Plan for CT CAP before next visit.  -F/u in 2 weeks    2. Anorexia, improved  -She  reportedly weighed 253 lbs a few years ago, and weighed 203 lbs at symptom onset~11/2018 -190 lbsin 02/2019.  -Continue to f/u withdietician.  -She has been eating well and walking often. She is able to maintain appetite and strength. Weight is trending up. I encouraged her to continue.    3. 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 -Will give IV iron if needed  4. HTN -on hydralazine, isosorbide, lisinopril, metoprolol. Continue medications and f/u with PCP. -I discussed avastin can increase her blood pressure.  -Her BP was elevates today, mainly in right arm. Her left arm BP was better. Will repeat in clinic today.  -She plans to f/u with her PCP on 2/16.   5. Social support -She worked in [employer] at 2 different jobs, but has been out of work lately due to her symptoms  -She is not getting disability, no pay, but her employer kept her on their insurance plan -She may be eligible for Medicaid [employer] referred toSW to discuss   6. Goals of care discussion -The patient understands the goal of care is palliative. -I recommend DNR/DNI, she will think  about it  PLAN: -Labs reviewed and adequate to proceed with C6 FOLFOX and avastin   -Lab, flush, f/u and FOLFOX and Avastin in 2, 4, 6, weeks  -CT CAP W Contrast in 1-2 weeks    No problem-specific Assessment & Plan notes found for this encounter.   Orders Placed This Encounter  Procedures  . CT Abdomen Pelvis W Contrast    Standing Status:   Future    Standing Expiration Date:   05/20/2020    Order Specific Question:   If indicated for the ordered procedure, I authorize the administration of contrast media per Radiology protocol    Answer:   Yes    Order Specific Question:   Is patient pregnant?    Answer:   No    Order Specific Question:   Preferred imaging location?    Answer:   [facility]    Order Specific Question:   Is Oral Contrast requested for this exam?    Answer:   Yes, Per Radiology protocol    Order Specific Question:   Radiology Contrast Protocol - do NOT remove file path    Answer:   \\[email or web address]  . CT Chest W Contrast    Standing Status:   Future    Standing Expiration Date:   05/20/2020    Order Specific Question:   If indicated for the ordered procedure, I authorize the administration of contrast media per Radiology protocol    Answer:   Yes    Order Specific Question:   Is patient pregnant?    Answer:   No    Order Specific Question:   Preferred imaging location?    Answer:   [facility]    Order Specific Question:   Radiology Contrast Protocol - do NOT remove file path    Answer:   \\[email or web address]  . Total Protein, Urine dipstick    Standing Status:   Standing    Number of Occurrences:   50    Standing Expiration Date:   05/20/2024   All questions were answered. The patient knows to call the clinic with any problems, questions or concerns. No barriers to learning was detected. The total time spent in the appointment was 30 minutes.     Truitt Merle,  MD 05/21/2019   Oneal Deputy, am acting as scribe for Truitt Merle, MD.   I have reviewed the above documentation for accuracy and completeness, and I agree with the above.

## 2019-05-21 ENCOUNTER — Inpatient Hospital Stay: Payer: BC Managed Care – PPO

## 2019-05-21 ENCOUNTER — Inpatient Hospital Stay (HOSPITAL_BASED_OUTPATIENT_CLINIC_OR_DEPARTMENT_OTHER): Payer: BC Managed Care – PPO | Admitting: Hematology

## 2019-05-21 ENCOUNTER — Telehealth: Payer: Self-pay | Admitting: Hematology

## 2019-05-21 ENCOUNTER — Encounter: Payer: Self-pay | Admitting: Hematology

## 2019-05-21 ENCOUNTER — Other Ambulatory Visit: Payer: Self-pay

## 2019-05-21 VITALS — BP 139/72 | HR 53 | Temp 98.3°F | Resp 17 | Ht 67.0 in | Wt 184.6 lb

## 2019-05-21 DIAGNOSIS — Z5111 Encounter for antineoplastic chemotherapy: Secondary | ICD-10-CM | POA: Diagnosis not present

## 2019-05-21 DIAGNOSIS — C184 Malignant neoplasm of transverse colon: Secondary | ICD-10-CM | POA: Diagnosis not present

## 2019-05-21 DIAGNOSIS — R110 Nausea: Secondary | ICD-10-CM | POA: Diagnosis not present

## 2019-05-21 DIAGNOSIS — Z5112 Encounter for antineoplastic immunotherapy: Secondary | ICD-10-CM | POA: Diagnosis not present

## 2019-05-21 DIAGNOSIS — C189 Malignant neoplasm of colon, unspecified: Secondary | ICD-10-CM

## 2019-05-21 DIAGNOSIS — Z95828 Presence of other vascular implants and grafts: Secondary | ICD-10-CM

## 2019-05-21 DIAGNOSIS — C778 Secondary and unspecified malignant neoplasm of lymph nodes of multiple regions: Secondary | ICD-10-CM | POA: Diagnosis not present

## 2019-05-21 DIAGNOSIS — C787 Secondary malignant neoplasm of liver and intrahepatic bile duct: Secondary | ICD-10-CM | POA: Diagnosis not present

## 2019-05-21 DIAGNOSIS — I10 Essential (primary) hypertension: Secondary | ICD-10-CM

## 2019-05-21 DIAGNOSIS — Z7189 Other specified counseling: Secondary | ICD-10-CM

## 2019-05-21 LAB — IRON AND TIBC
Iron: 79 ug/dL (ref 41–142)
Saturation Ratios: 27 % (ref 21–57)
TIBC: 294 ug/dL (ref 236–444)
UIBC: 215 ug/dL (ref 120–384)

## 2019-05-21 LAB — CBC WITH DIFFERENTIAL (CANCER CENTER ONLY)
Abs Immature Granulocytes: 0.01 10*3/uL (ref 0.00–0.07)
Basophils Absolute: 0.1 10*3/uL (ref 0.0–0.1)
Basophils Relative: 1 %
Eosinophils Absolute: 0.2 10*3/uL (ref 0.0–0.5)
Eosinophils Relative: 3 %
HCT: 34.1 % — ABNORMAL LOW (ref 36.0–46.0)
Hemoglobin: 10.8 g/dL — ABNORMAL LOW (ref 12.0–15.0)
Immature Granulocytes: 0 %
Lymphocytes Relative: 37 %
Lymphs Abs: 1.9 10*3/uL (ref 0.7–4.0)
MCH: 28.2 pg (ref 26.0–34.0)
MCHC: 31.7 g/dL (ref 30.0–36.0)
MCV: 89 fL (ref 80.0–100.0)
Monocytes Absolute: 0.5 10*3/uL (ref 0.1–1.0)
Monocytes Relative: 9 %
Neutro Abs: 2.6 10*3/uL (ref 1.7–7.7)
Neutrophils Relative %: 50 %
Platelet Count: 152 10*3/uL (ref 150–400)
RBC: 3.83 MIL/uL — ABNORMAL LOW (ref 3.87–5.11)
RDW: 19.4 % — ABNORMAL HIGH (ref 11.5–15.5)
WBC Count: 5.2 10*3/uL (ref 4.0–10.5)
nRBC: 0 % (ref 0.0–0.2)

## 2019-05-21 LAB — CMP (CANCER CENTER ONLY)
ALT: 15 U/L (ref 0–44)
AST: 20 U/L (ref 15–41)
Albumin: 3.6 g/dL (ref 3.5–5.0)
Alkaline Phosphatase: 95 U/L (ref 38–126)
Anion gap: 8 (ref 5–15)
BUN: 19 mg/dL (ref 6–20)
CO2: 22 mmol/L (ref 22–32)
Calcium: 9.2 mg/dL (ref 8.9–10.3)
Chloride: 111 mmol/L (ref 98–111)
Creatinine: 0.81 mg/dL (ref 0.44–1.00)
GFR, Est AFR Am: >60 mL/min (ref >60)
GFR, Estimated: >60 mL/min (ref >60)
Glucose, Bld: 100 mg/dL — ABNORMAL HIGH (ref 70–99)
Potassium: 4.1 mmol/L (ref 3.5–5.1)
Sodium: 141 mmol/L (ref 135–145)
Total Bilirubin: <0.2 mg/dL — ABNORMAL LOW (ref 0.3–1.2)
Total Protein: 7.2 g/dL (ref 6.5–8.1)

## 2019-05-21 LAB — TOTAL PROTEIN, URINE DIPSTICK: Protein, ur: NEGATIVE mg/dL

## 2019-05-21 LAB — FERRITIN: Ferritin: 66 ng/mL (ref 11–307)

## 2019-05-21 LAB — CEA (IN HOUSE-CHCC): CEA (CHCC-In House): 76.32 ng/mL — ABNORMAL HIGH (ref 0.00–5.00)

## 2019-05-21 MED ORDER — SODIUM CHLORIDE 0.9% FLUSH
10.0000 mL | INTRAVENOUS | Status: DC | PRN
  Administered 2019-05-21: 09:00:00 10 mL
  Filled 2019-05-21: qty 10

## 2019-05-21 MED ORDER — DEXTROSE 5 % IV SOLN
Freq: Once | INTRAVENOUS | Status: AC
  Filled 2019-05-21: qty 250

## 2019-05-21 MED ORDER — SODIUM CHLORIDE 0.9 % IV SOLN
2470.0000 mg/m2 | INTRAVENOUS | Status: DC
  Administered 2019-05-21: 5000 mg via INTRAVENOUS
  Filled 2019-05-21: qty 100

## 2019-05-21 MED ORDER — PALONOSETRON HCL INJECTION 0.25 MG/5ML
INTRAVENOUS | Status: AC
  Filled 2019-05-21: qty 5

## 2019-05-21 MED ORDER — LEUCOVORIN CALCIUM INJECTION 350 MG
400.0000 mg/m2 | Freq: Once | INTRAVENOUS | Status: AC
  Administered 2019-05-21: 808 mg via INTRAVENOUS
  Filled 2019-05-21: qty 40.4

## 2019-05-21 MED ORDER — DEXAMETHASONE SODIUM PHOSPHATE 10 MG/ML IJ SOLN
10.0000 mg | Freq: Once | INTRAMUSCULAR | Status: AC
  Administered 2019-05-21: 11:00:00 10 mg via INTRAVENOUS

## 2019-05-21 MED ORDER — SODIUM CHLORIDE 0.9 % IV SOLN
5.0000 mg/kg | Freq: Once | INTRAVENOUS | Status: AC
  Administered 2019-05-21: 400 mg via INTRAVENOUS
  Filled 2019-05-21: qty 16

## 2019-05-21 MED ORDER — OXALIPLATIN CHEMO INJECTION 100 MG/20ML
85.0000 mg/m2 | Freq: Once | INTRAVENOUS | Status: AC
  Administered 2019-05-21: 170 mg via INTRAVENOUS
  Filled 2019-05-21: qty 34

## 2019-05-21 MED ORDER — PALONOSETRON HCL INJECTION 0.25 MG/5ML
0.2500 mg | Freq: Once | INTRAVENOUS | Status: AC
  Administered 2019-05-21: 0.25 mg via INTRAVENOUS

## 2019-05-21 MED ORDER — DEXAMETHASONE SODIUM PHOSPHATE 10 MG/ML IJ SOLN
INTRAMUSCULAR | Status: AC
  Filled 2019-05-21: qty 1

## 2019-05-21 NOTE — Telephone Encounter (Signed)
Scheduled appt per 2/15 los - gave patient AVS and calender per los.

## 2019-05-21 NOTE — Patient Instructions (Signed)
[facility] Discharge Instructions for Patients Receiving Chemotherapy  Today you received the following chemotherapy agents:  Bevacizumab, Oxaliplatin, Leucovorin, Fluorouracil  To help prevent nausea and vomiting after your treatment, we encourage you to take your nausea medication as prescribed.   If you develop nausea and vomiting that is not controlled by your nausea medication, call the clinic.   BELOW ARE SYMPTOMS THAT SHOULD BE REPORTED IMMEDIATELY:  *FEVER GREATER THAN 100.5 F  *CHILLS WITH OR WITHOUT FEVER  NAUSEA AND VOMITING THAT IS NOT CONTROLLED WITH YOUR NAUSEA MEDICATION  *UNUSUAL SHORTNESS OF BREATH  *UNUSUAL BRUISING OR BLEEDING  TENDERNESS IN MOUTH AND THROAT WITH OR WITHOUT PRESENCE OF ULCERS  *URINARY PROBLEMS  *BOWEL PROBLEMS  UNUSUAL RASH Items with * indicate a potential emergency and should be followed up as soon as possible.  Feel free to call the clinic should you have any questions or concerns. The clinic phone number is [phone].  Please show the [place] at check-in to the Emergency Department and triage nurse.

## 2019-05-23 ENCOUNTER — Other Ambulatory Visit: Payer: Self-pay

## 2019-05-23 ENCOUNTER — Inpatient Hospital Stay: Payer: BC Managed Care – PPO

## 2019-05-23 VITALS — BP 138/72 | HR 62 | Temp 98.2°F | Resp 18

## 2019-05-23 DIAGNOSIS — C184 Malignant neoplasm of transverse colon: Secondary | ICD-10-CM | POA: Diagnosis not present

## 2019-05-23 DIAGNOSIS — Z5112 Encounter for antineoplastic immunotherapy: Secondary | ICD-10-CM | POA: Diagnosis not present

## 2019-05-23 DIAGNOSIS — C787 Secondary malignant neoplasm of liver and intrahepatic bile duct: Secondary | ICD-10-CM

## 2019-05-23 DIAGNOSIS — C189 Malignant neoplasm of colon, unspecified: Secondary | ICD-10-CM

## 2019-05-23 DIAGNOSIS — Z5111 Encounter for antineoplastic chemotherapy: Secondary | ICD-10-CM | POA: Diagnosis not present

## 2019-05-23 DIAGNOSIS — R110 Nausea: Secondary | ICD-10-CM | POA: Diagnosis not present

## 2019-05-23 DIAGNOSIS — C778 Secondary and unspecified malignant neoplasm of lymph nodes of multiple regions: Secondary | ICD-10-CM | POA: Diagnosis not present

## 2019-05-23 DIAGNOSIS — Z7189 Other specified counseling: Secondary | ICD-10-CM

## 2019-05-23 MED ORDER — SODIUM CHLORIDE 0.9% FLUSH
10.0000 mL | INTRAVENOUS | Status: DC | PRN
  Administered 2019-05-23: 10 mL
  Filled 2019-05-23: qty 10

## 2019-05-23 MED ORDER — HEPARIN SOD (PORK) LOCK FLUSH 100 UNIT/ML IV SOLN
500.0000 [IU] | Freq: Once | INTRAVENOUS | Status: AC | PRN
  Administered 2019-05-23: 500 [IU]
  Filled 2019-05-23: qty 5

## 2019-05-31 ENCOUNTER — Ambulatory Visit (HOSPITAL_COMMUNITY)
Admission: RE | Admit: 2019-05-31 | Discharge: 2019-05-31 | Disposition: A | Discharge status: Home / Self Care | Payer: BC Managed Care – PPO | Source: Ambulatory Visit | Attending: Hematology | Admitting: Hematology

## 2019-05-31 ENCOUNTER — Other Ambulatory Visit: Payer: Self-pay | Admitting: Hematology

## 2019-05-31 ENCOUNTER — Encounter (HOSPITAL_COMMUNITY): Payer: Self-pay | Admitting: Radiology

## 2019-05-31 ENCOUNTER — Other Ambulatory Visit: Payer: Self-pay

## 2019-05-31 DIAGNOSIS — K7689 Other specified diseases of liver: Secondary | ICD-10-CM | POA: Diagnosis not present

## 2019-05-31 DIAGNOSIS — J439 Emphysema, unspecified: Secondary | ICD-10-CM | POA: Diagnosis not present

## 2019-05-31 DIAGNOSIS — C189 Malignant neoplasm of colon, unspecified: Secondary | ICD-10-CM

## 2019-05-31 DIAGNOSIS — C787 Secondary malignant neoplasm of liver and intrahepatic bile duct: Secondary | ICD-10-CM | POA: Insufficient documentation

## 2019-05-31 MED ORDER — HEPARIN SOD (PORK) LOCK FLUSH 100 UNIT/ML IV SOLN
INTRAVENOUS | Status: AC
  Filled 2019-05-31: qty 5

## 2019-05-31 MED ORDER — HEPARIN SOD (PORK) LOCK FLUSH 100 UNIT/ML IV SOLN
500.0000 [IU] | Freq: Once | INTRAVENOUS | Status: AC
  Administered 2019-05-31: 500 [IU] via INTRAVENOUS

## 2019-05-31 MED ORDER — IOHEXOL 300 MG/ML  SOLN
100.0000 mL | Freq: Once | INTRAMUSCULAR | Status: AC | PRN
  Administered 2019-05-31: 100 mL via INTRAVENOUS

## 2019-05-31 MED ORDER — SODIUM CHLORIDE (PF) 0.9 % IJ SOLN
INTRAMUSCULAR | Status: AC
  Filled 2019-05-31: qty 50

## 2019-06-04 ENCOUNTER — Other Ambulatory Visit: Payer: Self-pay

## 2019-06-04 ENCOUNTER — Inpatient Hospital Stay: Payer: BC Managed Care – PPO

## 2019-06-04 ENCOUNTER — Inpatient Hospital Stay (HOSPITAL_BASED_OUTPATIENT_CLINIC_OR_DEPARTMENT_OTHER): Payer: BC Managed Care – PPO | Admitting: Nurse Practitioner

## 2019-06-04 ENCOUNTER — Encounter: Payer: Self-pay | Admitting: Nurse Practitioner

## 2019-06-04 ENCOUNTER — Inpatient Hospital Stay: Payer: BC Managed Care – PPO | Attending: Nurse Practitioner

## 2019-06-04 VITALS — BP 186/76 | HR 53 | Temp 98.5°F | Resp 17 | Ht 67.0 in | Wt 188.2 lb

## 2019-06-04 VITALS — BP 210/80

## 2019-06-04 DIAGNOSIS — C773 Secondary and unspecified malignant neoplasm of axilla and upper limb lymph nodes: Secondary | ICD-10-CM | POA: Insufficient documentation

## 2019-06-04 DIAGNOSIS — C787 Secondary malignant neoplasm of liver and intrahepatic bile duct: Secondary | ICD-10-CM

## 2019-06-04 DIAGNOSIS — C189 Malignant neoplasm of colon, unspecified: Secondary | ICD-10-CM | POA: Diagnosis not present

## 2019-06-04 DIAGNOSIS — Z5111 Encounter for antineoplastic chemotherapy: Secondary | ICD-10-CM | POA: Insufficient documentation

## 2019-06-04 DIAGNOSIS — Z7189 Other specified counseling: Secondary | ICD-10-CM

## 2019-06-04 DIAGNOSIS — I10 Essential (primary) hypertension: Secondary | ICD-10-CM | POA: Diagnosis not present

## 2019-06-04 DIAGNOSIS — C184 Malignant neoplasm of transverse colon: Secondary | ICD-10-CM | POA: Diagnosis not present

## 2019-06-04 DIAGNOSIS — D509 Iron deficiency anemia, unspecified: Secondary | ICD-10-CM | POA: Insufficient documentation

## 2019-06-04 DIAGNOSIS — Z95828 Presence of other vascular implants and grafts: Secondary | ICD-10-CM

## 2019-06-04 DIAGNOSIS — Z5112 Encounter for antineoplastic immunotherapy: Secondary | ICD-10-CM | POA: Insufficient documentation

## 2019-06-04 LAB — CMP (CANCER CENTER ONLY)
ALT: 16 U/L (ref 0–44)
AST: 22 U/L (ref 15–41)
Albumin: 3.5 g/dL (ref 3.5–5.0)
Alkaline Phosphatase: 98 U/L (ref 38–126)
Anion gap: 6 (ref 5–15)
BUN: 19 mg/dL (ref 6–20)
CO2: 25 mmol/L (ref 22–32)
Calcium: 9.5 mg/dL (ref 8.9–10.3)
Chloride: 109 mmol/L (ref 98–111)
Creatinine: 0.83 mg/dL (ref 0.44–1.00)
GFR, Est AFR Am: >60 mL/min (ref >60)
GFR, Estimated: >60 mL/min (ref >60)
Glucose, Bld: 83 mg/dL (ref 70–99)
Potassium: 5 mmol/L (ref 3.5–5.1)
Sodium: 140 mmol/L (ref 135–145)
Total Bilirubin: 0.3 mg/dL (ref 0.3–1.2)
Total Protein: 7.1 g/dL (ref 6.5–8.1)

## 2019-06-04 LAB — CBC WITH DIFFERENTIAL (CANCER CENTER ONLY)
Abs Immature Granulocytes: 0.01 10*3/uL (ref 0.00–0.07)
Basophils Absolute: 0 10*3/uL (ref 0.0–0.1)
Basophils Relative: 1 %
Eosinophils Absolute: 0.1 10*3/uL (ref 0.0–0.5)
Eosinophils Relative: 3 %
HCT: 33.9 % — ABNORMAL LOW (ref 36.0–46.0)
Hemoglobin: 10.7 g/dL — ABNORMAL LOW (ref 12.0–15.0)
Immature Granulocytes: 0 %
Lymphocytes Relative: 36 %
Lymphs Abs: 1.5 10*3/uL (ref 0.7–4.0)
MCH: 27.7 pg (ref 26.0–34.0)
MCHC: 31.6 g/dL (ref 30.0–36.0)
MCV: 87.8 fL (ref 80.0–100.0)
Monocytes Absolute: 0.4 10*3/uL (ref 0.1–1.0)
Monocytes Relative: 10 %
Neutro Abs: 2.2 10*3/uL (ref 1.7–7.7)
Neutrophils Relative %: 50 %
Platelet Count: 137 10*3/uL — ABNORMAL LOW (ref 150–400)
RBC: 3.86 MIL/uL — ABNORMAL LOW (ref 3.87–5.11)
RDW: 18.9 % — ABNORMAL HIGH (ref 11.5–15.5)
WBC Count: 4.3 10*3/uL (ref 4.0–10.5)
nRBC: 0 % (ref 0.0–0.2)

## 2019-06-04 LAB — TOTAL PROTEIN, URINE DIPSTICK: Protein, ur: NEGATIVE mg/dL

## 2019-06-04 MED ORDER — DEXTROSE 5 % IV SOLN
Freq: Once | INTRAVENOUS | Status: DC
  Filled 2019-06-04: qty 250

## 2019-06-04 MED ORDER — CLONIDINE HCL 0.1 MG PO TABS
ORAL_TABLET | ORAL | Status: AC
  Filled 2019-06-04: qty 1

## 2019-06-04 MED ORDER — SODIUM CHLORIDE 0.9 % IV SOLN
2470.0000 mg/m2 | INTRAVENOUS | Status: DC
  Administered 2019-06-04: 5000 mg via INTRAVENOUS
  Filled 2019-06-04: qty 100

## 2019-06-04 MED ORDER — CLONIDINE HCL 0.1 MG PO TABS: 0.2000 mg | ORAL_TABLET | Freq: Once | ORAL | Status: DC

## 2019-06-04 MED ORDER — OXALIPLATIN CHEMO INJECTION 100 MG/20ML
85.0000 mg/m2 | Freq: Once | INTRAVENOUS | Status: AC
  Administered 2019-06-04: 170 mg via INTRAVENOUS
  Filled 2019-06-04: qty 34

## 2019-06-04 MED ORDER — CLONIDINE HCL 0.1 MG PO TABS
0.1000 mg | ORAL_TABLET | Freq: Once | ORAL | Status: AC
  Administered 2019-06-04: 0.1 mg via ORAL

## 2019-06-04 MED ORDER — PALONOSETRON HCL INJECTION 0.25 MG/5ML
0.2500 mg | Freq: Once | INTRAVENOUS | Status: AC
  Administered 2019-06-04: 0.25 mg via INTRAVENOUS

## 2019-06-04 MED ORDER — CLONIDINE HCL 0.1 MG PO TABS
ORAL_TABLET | ORAL | Status: AC
  Filled 2019-06-04: qty 2

## 2019-06-04 MED ORDER — CLONIDINE HCL 0.1 MG PO TABS
0.2000 mg | ORAL_TABLET | Freq: Once | ORAL | Status: AC
  Administered 2019-06-04: 0.2 mg via ORAL

## 2019-06-04 MED ORDER — SODIUM CHLORIDE 0.9 % IV SOLN
5.0000 mg/kg | Freq: Once | INTRAVENOUS | Status: AC
  Administered 2019-06-04: 400 mg via INTRAVENOUS
  Filled 2019-06-04: qty 16

## 2019-06-04 MED ORDER — PALONOSETRON HCL INJECTION 0.25 MG/5ML
INTRAVENOUS | Status: AC
  Filled 2019-06-04: qty 5

## 2019-06-04 MED ORDER — SODIUM CHLORIDE 0.9% FLUSH
10.0000 mL | INTRAVENOUS | Status: DC | PRN
  Administered 2019-06-04: 10 mL
  Filled 2019-06-04: qty 10

## 2019-06-04 MED ORDER — DEXAMETHASONE SODIUM PHOSPHATE 10 MG/ML IJ SOLN
INTRAMUSCULAR | Status: AC
  Filled 2019-06-04: qty 1

## 2019-06-04 MED ORDER — DEXAMETHASONE SODIUM PHOSPHATE 10 MG/ML IJ SOLN
10.0000 mg | Freq: Once | INTRAMUSCULAR | Status: AC
  Administered 2019-06-04: 10 mg via INTRAVENOUS

## 2019-06-04 MED ORDER — LEUCOVORIN CALCIUM INJECTION 350 MG
400.0000 mg/m2 | Freq: Once | INTRAVENOUS | Status: AC
  Administered 2019-06-04: 808 mg via INTRAVENOUS
  Filled 2019-06-04: qty 40.4

## 2019-06-04 MED ORDER — SODIUM CHLORIDE 0.9 % IV SOLN
INTRAVENOUS | Status: DC
  Filled 2019-06-04 (×2): qty 250

## 2019-06-04 MED ORDER — DEXTROSE 5 % IV SOLN
Freq: Once | INTRAVENOUS | Status: AC
  Filled 2019-06-04: qty 250

## 2019-06-04 NOTE — Patient Instructions (Signed)

## 2019-06-04 NOTE — Addendum Note (Signed)
Addended by: Truitt Merle on: 06/04/2019 10:55 AM   Modules accepted: Orders

## 2019-06-04 NOTE — Progress Notes (Addendum)
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 Bowel-gas pattern consistent with small bowel obstruction. No free air. 2. No acute chest findings.   01/23/2019 Imaging   CT AP IMPRESSION: Obstructing mid transverse colonic mass with mild regional adenopathy and hepatic metastatic disease. The mass likely extends through the serosa; no ascites or peritoneal nodularity.   01/24/2019 Surgery   Surgeon: Clovis Riley MD Assistant: [name] Procedure performed: Transverse colectomy with end colostomy, liver biopsy Procedure classification: URGENT/EMERGENT Preop diagnosis: Obstructing, metastatic transverse colon mass Post-op diagnosis/intraop findings: Same   01/24/2019 Pathology Results   FINAL MICROSCOPIC DIAGNOSIS:   A. COLON, TRANSVERSE, RESECTION:  Colonic adenocarcinoma, 5 cm.  Carcinoma extends into pericolonic connective tissue and focally to  serosal surface.  Margins not involved.  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   B. LIVER NODULE, LEFT, BIOPSY:  Metastatic adenocarcinoma.    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 Hypermetabolic metastatic disease in the liver and mediastinal/hilar/axillary lymph nodes. 2. Focal hypermetabolism in the rectum. Continued attention on follow-up exams is warranted. 3. Focal hypermetabolism medial to the right adrenal gland may be within a metastatic lymph node, better visualized on 01/23/2019. 4. Aortic atherosclerosis (ICD10-170.0). Coronary artery calcification.   03/12/2019 -  Chemotherapy   She started 5FU q2weeks on 03/12/19 for 2 cycles. She started full dose FOLFOX with Avastin on 04/09/19.   05/31/2019 Imaging   Restaging CT CAP IMPRESSION: 1. Similar to mild interval decrease in size of multiple hepatic lesions, partially calcified. 2. 2 mm right upper lobe pulmonary nodule. Recommend attention on follow-up. 3. Emphysema and aortic atherosclerosis.     CURRENT THERAPY:  She started 5FU q2weeks on 03/12/19 for 2 cycles. She started full dose FOLFOX with Avastin on 04/09/19.  INTERVAL HISTORY: Ms., [name] returns for f/u and treatment as scheduled. She completed cycle 6 FOLFOX and avastin on 05/21/19. She feels well today. She has mild nausea on the third day, managed with zofran. No vomiting. Able to eat and gain weight. Cold sensitivity lasts 4-6 days, no residual neuropathy. Denies fatigue, mucositis, constipation, diarrhea, fever, chills, cough, chest pain, dyspnea, leg swelling, abdominal pain, bleeding.    MEDICAL HISTORY:  Past Medical History:  Diagnosis Date  . Anemia    low iron  . Cancer [facility])    colon cancer  . Hypertension 01/23/2019  . SBO (small bowel obstruction) ([place]) 01/23/2019    SURGICAL HISTORY: Past Surgical History:  Procedure Laterality Date  .  CESAREAN SECTION     x2  . COLOSTOMY N/A 01/24/2019   Procedure:  End Loop Colostomy;  Surgeon: Clovis Riley, MD;  Location: [place];  Service: General;  Laterality: N/A;  . PARTIAL COLECTOMY N/A 01/24/2019   Procedure: PARTIAL COLECTOMY;  Surgeon: Clovis Riley, MD;  Location: [place];  Service: General;  Laterality: N/A;  . PORTACATH PLACEMENT Right 02/28/2019   Procedure: INSERTION PORT-A-CATH WITH ULTRASOUND GUIDANCE;  Surgeon: Clovis Riley, MD;  Location: [place];  Service: General;  Laterality: Right;    I have reviewed the social history and family history with the patient and they are unchanged from previous note.  ALLERGIES:  has No Known Allergies.  MEDICATIONS:  Current Outpatient Medications  Medication Sig Dispense Refill  . acetaminophen (TYLENOL) 325 MG tablet Take 2 tablets (650 mg total) by mouth every 6 (six) hours as needed.    . ferrous sulfate 325 (65 FE) MG tablet TAKE 1 TABLET (325 MG TOTAL) BY MOUTH 2 (TWO) TIMES DAILY WITH A MEAL. 180 tablet 1  . hydrALAZINE (APRESOLINE) 25 MG tablet Take 1 tablet (25 mg total) by mouth every 8 (eight) hours. 90 tablet 0  . Ibuprofen (ADVIL) 200 MG CAPS Take 400 mg by mouth daily as needed (pain).    . isosorbide mononitrate (IMDUR) 30 MG 24 hr tablet Take 1 tablet (30 mg total) by mouth daily. 90 tablet 0  . lidocaine-prilocaine (EMLA) cream Apply to affected area once (Patient taking differently: Apply 1 application topically once. Apply to affected area once) 30 g 3  . lisinopril (ZESTRIL) 40 MG tablet TAKE 1 TABLET BY MOUTH EVERY DAY 90 tablet 1  . metoprolol tartrate (LOPRESSOR) 50 MG tablet TAKE 1 TABLET BY MOUTH TWICE A DAY 180 tablet 1  . ondansetron (ZOFRAN) 4 MG tablet Take 1 tablet (4 mg total) by mouth every 6 (six) hours as needed for nausea. 30 tablet 0  . ondansetron (ZOFRAN) 8 MG tablet Take 1 tablet (8 mg total) by mouth 2 (two) times daily as needed for refractory nausea / vomiting. Start on day 3 after chemotherapy. 30 tablet 1  . polycarbophil (FIBERCON) 625 MG tablet  Take 1 tablet (625 mg total) by mouth daily. 30 tablet 0  . prochlorperazine (COMPAZINE) 10 MG tablet Take 1 tablet (10 mg total) by mouth every 6 (six) hours as needed (Nausea or vomiting). 30 tablet 1  . loperamide (IMODIUM) 2 MG capsule Take 1 capsule (2 mg total) by mouth as needed for diarrhea or loose stools. 90 capsule 0  . nicotine (NICODERM CQ - DOSED IN MG/24 HR) 7 mg/24hr patch Place 1 patch (7 mg total) onto the skin daily. 7 patch 0  . oxyCODONE 10 MG TABS Take 0.5-1 tablets (5-10 mg total) by mouth every 4 (four) hours as needed for moderate pain or severe pain (5 moderate, 10 severe). 20 tablet 0   No current facility-administered medications for this visit.    PHYSICAL EXAMINATION: ECOG PERFORMANCE STATUS: 1 - Symptomatic but completely ambulatory  Vitals:   06/04/19 0911 06/04/19 0916  BP: (!) 190/67 (!) 186/76  Pulse: (!) 53   Resp: 17   Temp: 98.5 F (36.9 C)   SpO2: 100%    Filed Weights   06/04/19 0911  Weight: 188 lb 3.2 oz (85.4 kg)    GENERAL:alert, no distress and comfortable SKIN: no rash. Palms with hyperpigmentation  EYES:  sclera clear OROPHARYNX: no thrush or ulcers LUNGS: clear with normal breathing  effort HEART: regular rate & rhythm, no lower extremity edema ABDOMEN: abdomen soft, non-tender and normal bowel sounds. Colostomy in place, mildly prolapsed stoma. Incision healed.  NEURO: alert & oriented x 3 with fluent speech, normal gait PAC without erythema   LABORATORY DATA:  I have reviewed the data as listed CBC Latest Ref Rng & Units 06/04/2019 05/21/2019 05/07/2019  WBC 4.0 - 10.5 K/uL 4.3 5.2 6.1  Hemoglobin 12.0 - 15.0 g/dL 10.7(L) 10.8(L) 10.6(L)  Hematocrit 36.0 - 46.0 % 33.9(L) 34.1(L) 33.1(L)  Platelets 150 - 400 K/uL 137(L) 152 166     CMP Latest Ref Rng & Units 06/04/2019 05/21/2019 05/07/2019  Glucose 70 - 99 mg/dL 83 100(H) 100(H)  BUN 6 - 20 mg/dL 19 19 18   Creatinine 0.44 - 1.00 mg/dL 0.83 0.81 0.81  Sodium 135 - 145 mmol/L 140  141 137  Potassium 3.5 - 5.1 mmol/L 5.0 4.1 4.2  Chloride 98 - 111 mmol/L 109 111 107  CO2 22 - 32 mmol/L 25 22 24   Calcium 8.9 - 10.3 mg/dL 9.5 9.2 9.4  Total Protein 6.5 - 8.1 g/dL 7.1 7.2 7.3  Total Bilirubin 0.3 - 1.2 mg/dL 0.3 <0.2(L) 0.3  Alkaline Phos 38 - 126 U/L 98 95 88  AST 15 - 41 U/L 22 20 14(L)  ALT 0 - 44 U/L 16 15 12       RADIOGRAPHIC STUDIES: I have personally reviewed the radiological images as listed and agreed with the findings in the report. No results found.   ASSESSMENT & PLAN: Casey [name] a 59 y.o.femalewith   1. Adenocarcinoma of transverse colon, moderately differentiated, pT4aN1aM1a stage IV with liverand nodalmetastasis, MMR normal  -Diagnosed in 01/2019 afteremergent colectomy and liver biopsy. 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  -PET from 02/26/19 showsknown liver metastasis and metastatic lymphadenopathy in chest and right axilla.  -[name] shows MSI stable disease, Kras+ I reviewed this with her. No targetable mutation, she is not a candidate for EGFR or immunotherapy. -Began palliative first line chemo, received 5FU/leuc with first 2 cycles for delayed wound healing, she started full dose FOLFOX and avastin with cycle 2  2. Nutrition/Anorexia  -she reportedly weighed 253 lbs a few years ago, and weighed 203 lbs at symptom onset~11/2018 -190 lbsin 02/2019.  -Continue to f/u withdietician -she has been able to gain weight on treatment   3. Abdominal wound -She hada large open midline incision after surgery, closed with wound vac. -she received 5FU/leuc for first 2 cycles while her wound healed -it completely closed and she started FOLFOX/avastin. No concerns today  4. 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  -ContinueBID  5. HTN -on hydralazine, isosorbide,  lisinopril, metoprolol.  -BP elevated in clinic, likely contributed by anxiety and avastin.  -continue monitoring and check at home.  -f/u with PCP next week  6. Social support -She worked in [employer] at 2 different jobs, but has been out of work lately due to her symptoms  -She is not getting disability, no pay, but her employer kept her on their insurance plan -has been referred to SW to discuss financial option/resources  7. Goals of care discussion -Goal is palliative, to control disease and prolong her life.  -we again discussed her cancer is not curable at this stage, and that we will continue treatment as long as she can tolerate and her disease  is controlled. She understands. -full code    Disposition:  [name] appears stable. She completed 2 cycles of 5FU/leuc and 4 cycles of FOLFOX and avastin. She tolerates treatment very well with mild nausea and cold sensitivity. She recovers well. Able to gain weight and function well. She will f/u with her PCP next week regarding HTN and possible medication adjustment.  I personally reviewed her CT CAP and discussed with Dr. Burr Medico and the patient, which shows stable to slightly decreased liver metastasis, stable paratracheal node. No axillary adenopathy. There is a 2 mm RUL nodule that will be followed on subsequent imaging. Overall adequate to continue FOLFOX and Avastin.   Labs reviewed. CMP is normal. CBC shows stable anemia and mild thrombocytopenia. Adequate for treatment, she will receive cycle 7 FOLFOX/avastin today, same dose.   She will return for f/u and cycle 8 in 2 weeks. She was given information on scheduling her COVID-19 vaccine.   No problem-specific Assessment & Plan notes found for this encounter.   No orders of the defined types were placed in this encounter.  All questions were answered. The patient knows to call the clinic with any problems, questions or concerns. No barriers to learning was detected. I  spent 25 minutes in the encounter including, patient care, counseling and review of test results.     [name], NP 06/04/19   Addendum: per infusion RN, Casey [name] remained hypertensive throughout her infusion today. She was given clonidine 0.2 mg prior to receiving avastin, BP improved to 159/67 but later was elevated again SBP>200 after chemo/avastin. She received additional clonidine 0.1, BP still 200/80 at 30 minutes post-administration. She remained asymptomatic, no headache, vision change, lightheadedness, etc. She skipped PM dose of hydralazine during infusion today, usually takes q8 in additional to regular anti-hypertensives. This is likely related to Avastin. She will continue checking at home and I encouraged her to call PCP tomorrow. She may need to be on amlodipine. Will f/u closely. She knows to go to ED if SBP persistently >180 or she develops severe acute headache, vision change, lightheaded, etc. I will cc her PCP.   Cira Rue, NP  06/04/2019

## 2019-06-04 NOTE — Patient Instructions (Signed)
Casey [name] Discharge Instructions for Patients Receiving Chemotherapy  Today you received the following chemotherapy agents:  Bevacizumab, Oxaliplatin, Leucovorin, Fluorouracil  To help prevent nausea and vomiting after your treatment, we encourage you to take your nausea medication as prescribed.   If you develop nausea and vomiting that is not controlled by your nausea medication, call the clinic.   BELOW ARE SYMPTOMS THAT SHOULD BE REPORTED IMMEDIATELY:  *FEVER GREATER THAN 100.5 F  *CHILLS WITH OR WITHOUT FEVER  NAUSEA AND VOMITING THAT IS NOT CONTROLLED WITH YOUR NAUSEA MEDICATION  *UNUSUAL SHORTNESS OF BREATH  *UNUSUAL BRUISING OR BLEEDING  TENDERNESS IN MOUTH AND THROAT WITH OR WITHOUT PRESENCE OF ULCERS  *URINARY PROBLEMS  *BOWEL PROBLEMS  UNUSUAL RASH Items with * indicate a potential emergency and should be followed up as soon as possible.  Feel free to call the clinic should you have any questions or concerns. The clinic phone number is [phone].  Please show the [place] at check-in to the Emergency Department and triage nurse.

## 2019-06-04 NOTE — Progress Notes (Signed)
Ok to begin FOLFOX treatment with current BP. Will give Clonidine and re-eval BP prior to Avastin.  Hardie Pulley, PharmD, BCPS, BCOP

## 2019-06-04 NOTE — Progress Notes (Signed)
Per Dr Burr Medico OK to move forward with Folfox with current BP. Will give 0.2mg  clonidine per MD order and recheck BP.  Will give bevacizumab if BP comes down below [id number] systolic.  Pt seen chair side by Cira Rue, NP d/t persistent elevated BP. Pt educated on f/u with PCP regarding cardiac medications, s/s that she would need to immediately go to the ED for. Pt verbalizes understanding and discharged to home with no further questions.

## 2019-06-05 ENCOUNTER — Telehealth: Payer: Self-pay | Admitting: Nurse Practitioner

## 2019-06-05 NOTE — Telephone Encounter (Signed)
No los per 3/1.

## 2019-06-06 ENCOUNTER — Other Ambulatory Visit: Payer: Self-pay

## 2019-06-06 ENCOUNTER — Inpatient Hospital Stay: Payer: BC Managed Care – PPO

## 2019-06-06 VITALS — BP 128/72 | HR 72 | Temp 98.2°F | Resp 18

## 2019-06-06 DIAGNOSIS — Z5112 Encounter for antineoplastic immunotherapy: Secondary | ICD-10-CM | POA: Diagnosis not present

## 2019-06-06 DIAGNOSIS — D509 Iron deficiency anemia, unspecified: Secondary | ICD-10-CM | POA: Diagnosis not present

## 2019-06-06 DIAGNOSIS — Z5111 Encounter for antineoplastic chemotherapy: Secondary | ICD-10-CM | POA: Diagnosis not present

## 2019-06-06 DIAGNOSIS — Z7189 Other specified counseling: Secondary | ICD-10-CM

## 2019-06-06 DIAGNOSIS — C773 Secondary and unspecified malignant neoplasm of axilla and upper limb lymph nodes: Secondary | ICD-10-CM | POA: Diagnosis not present

## 2019-06-06 DIAGNOSIS — C787 Secondary malignant neoplasm of liver and intrahepatic bile duct: Secondary | ICD-10-CM | POA: Diagnosis not present

## 2019-06-06 DIAGNOSIS — C189 Malignant neoplasm of colon, unspecified: Secondary | ICD-10-CM

## 2019-06-06 DIAGNOSIS — C184 Malignant neoplasm of transverse colon: Secondary | ICD-10-CM | POA: Diagnosis not present

## 2019-06-06 MED ORDER — SODIUM CHLORIDE 0.9% FLUSH
10.0000 mL | INTRAVENOUS | Status: DC | PRN
  Administered 2019-06-06: 10 mL
  Filled 2019-06-06: qty 10

## 2019-06-06 MED ORDER — HEPARIN SOD (PORK) LOCK FLUSH 100 UNIT/ML IV SOLN
500.0000 [IU] | Freq: Once | INTRAVENOUS | Status: AC | PRN
  Administered 2019-06-06: 500 [IU]
  Filled 2019-06-06: qty 5

## 2019-06-10 ENCOUNTER — Other Ambulatory Visit: Payer: Self-pay | Admitting: Internal Medicine

## 2019-06-10 DIAGNOSIS — C189 Malignant neoplasm of colon, unspecified: Secondary | ICD-10-CM | POA: Diagnosis not present

## 2019-06-14 ENCOUNTER — Encounter: Payer: BC Managed Care – PPO | Admitting: Internal Medicine

## 2019-06-17 NOTE — Progress Notes (Signed)
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 Bowel-gas pattern consistent with small bowel obstruction. No free air. 2. No acute chest findings.   01/23/2019 Imaging   CT AP IMPRESSION: Obstructing mid transverse colonic mass with mild regional adenopathy and hepatic metastatic disease. The mass likely extends through the serosa; no ascites or peritoneal nodularity.   01/24/2019 Surgery   Surgeon: Clovis Riley MD Assistant: [name] Procedure performed: Transverse colectomy with end colostomy, liver biopsy Procedure classification: URGENT/EMERGENT Preop diagnosis: Obstructing, metastatic transverse colon mass Post-op diagnosis/intraop findings: Same   01/24/2019 Pathology Results   FINAL MICROSCOPIC DIAGNOSIS:   A. COLON, TRANSVERSE, RESECTION:  Colonic adenocarcinoma, 5 cm.  Carcinoma extends into pericolonic connective tissue and focally to  serosal surface.  Margins not involved.  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   B. LIVER NODULE, LEFT, BIOPSY:  Metastatic adenocarcinoma.    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 Hypermetabolic metastatic disease in the liver and mediastinal/hilar/axillary lymph nodes. 2. Focal hypermetabolism in the rectum. Continued attention on follow-up exams is warranted. 3. Focal hypermetabolism medial to the right adrenal gland may be within a metastatic lymph node, better visualized on 01/23/2019. 4. Aortic atherosclerosis (ICD10-170.0). Coronary artery calcification.   03/12/2019 -  Chemotherapy   She started 5FU q2weeks on 03/12/19 for 2 cycles. She started full dose FOLFOX with Avastin on 04/09/19.   05/31/2019 Imaging   Restaging CT CAP IMPRESSION: 1. Similar to mild interval decrease in size of multiple hepatic lesions, partially calcified. 2. 2 mm right upper lobe pulmonary nodule. Recommend attention on follow-up. 3. Emphysema and aortic atherosclerosis.     CURRENT THERAPY:  She started 5FU q2weeks on 03/12/19 for 2 cycles. She started full dose FOLFOX with Avastin on 04/09/19.  INTERVAL HISTORY: Ms. Mccaughey returns for f/u and treatment as scheduled. She completed cycle 7 on 06/04/19. She feels well today. Denies decreased energy or appetite. She has mild cold sensitivity and nausea for 3 days following chemotherapy, then resolves. No residual neuropathy. No vomiting. Stools fluctuate in consistency with daily BM. Her baseline intermittent abdominal pain is partially managed with tylenol, no new pain. No recent fever, chills, cough, chest pain, dyspnea, leg edema. Her BP "is always high," her PCP appt was rescheduled to April. Denies headache, vision change.    MEDICAL HISTORY:  Past Medical History:  Diagnosis Date  . Anemia    low iron  . Cancer [facility])    colon cancer  .  Hypertension 01/23/2019  . SBO (small bowel obstruction) ([place]) 01/23/2019     SURGICAL HISTORY: Past Surgical History:  Procedure Laterality Date  . CESAREAN SECTION     x2  . COLOSTOMY N/A 01/24/2019   Procedure: End Loop Colostomy;  Surgeon: Clovis Riley, MD;  Location: [place];  Service: General;  Laterality: N/A;  . PARTIAL COLECTOMY N/A 01/24/2019   Procedure: PARTIAL COLECTOMY;  Surgeon: Clovis Riley, MD;  Location: [place];  Service: General;  Laterality: N/A;  . PORTACATH PLACEMENT Right 02/28/2019   Procedure: INSERTION PORT-A-CATH WITH ULTRASOUND GUIDANCE;  Surgeon: Clovis Riley, MD;  Location: [place];  Service: General;  Laterality: Right;    I have reviewed the social history and family history with the patient and they are unchanged from previous note.  ALLERGIES:  has No Known Allergies.  MEDICATIONS:  Current Outpatient Medications  Medication Sig Dispense Refill  . acetaminophen (TYLENOL) 325 MG tablet Take 2 tablets (650 mg total) by mouth every 6 (six) hours as needed.    . ferrous sulfate 325 (65 FE) MG tablet TAKE 1 TABLET (325 MG TOTAL) BY MOUTH 2 (TWO) TIMES DAILY WITH A MEAL. 180 tablet 1  . hydrALAZINE (APRESOLINE) 25 MG tablet TAKE 1 TABLET (25 MG TOTAL) BY MOUTH EVERY 8 (EIGHT) HOURS. 90 tablet 0  . Ibuprofen (ADVIL) 200 MG CAPS Take 400 mg by mouth daily as needed (pain).    . isosorbide mononitrate (IMDUR) 30 MG 24 hr tablet TAKE 1 TABLET BY MOUTH EVERY DAY 90 tablet 0  . lidocaine-prilocaine (EMLA) cream Apply to affected area once (Patient taking differently: Apply 1 application topically once. Apply to affected area once) 30 g 3  . lisinopril (ZESTRIL) 40 MG tablet TAKE 1 TABLET BY MOUTH EVERY DAY 90 tablet 1  . loperamide (IMODIUM) 2 MG capsule Take 1 capsule (2 mg total) by mouth as needed for diarrhea or loose stools. 90 capsule 0  . metoprolol tartrate (LOPRESSOR) 50 MG tablet TAKE 1 TABLET BY MOUTH TWICE A DAY 180 tablet 1  . ondansetron (ZOFRAN) 4 MG tablet Take 1 tablet (4 mg total) by mouth every 6 (six) hours  as needed for nausea. 30 tablet 0  . ondansetron (ZOFRAN) 8 MG tablet Take 1 tablet (8 mg total) by mouth 2 (two) times daily as needed for refractory nausea / vomiting. Start on day 3 after chemotherapy. 30 tablet 1  . polycarbophil (FIBERCON) 625 MG tablet Take 1 tablet (625 mg total) by mouth daily. 30 tablet 0  . prochlorperazine (COMPAZINE) 10 MG tablet Take 1 tablet (10 mg total) by mouth every 6 (six) hours as needed (Nausea or vomiting). 30 tablet 1   No current facility-administered medications for this visit.   Facility-Administered Medications Ordered in Other Visits  Medication Dose Route Frequency Provider Last Rate Last Admin  . fluorouracil (ADRUCIL) 5,000 mg in sodium chloride 0.9 % 150 mL chemo infusion  2,470 mg/m2 (Treatment Plan Recorded) Intravenous 1 day or 1 dose Truitt Merle, MD      . leucovorin 808 mg in dextrose 5 % 250 mL infusion  400 mg/m2 (Treatment Plan Recorded) Intravenous Once Truitt Merle, MD 145 mL/hr at 06/18/19 1016 808 mg at 06/18/19 1016  . oxaliplatin (ELOXATIN) 170 mg in dextrose 5 % 500 mL chemo infusion  85 mg/m2 (Treatment Plan Recorded) Intravenous Once Truitt Merle, MD 267 mL/hr at 06/18/19 1013 170 mg at 06/18/19 1013    PHYSICAL EXAMINATION: ECOG PERFORMANCE STATUS: 0 -  Asymptomatic  Vitals:   06/18/19 0827 06/18/19 0852  BP: (!) 213/88 (!) 190/79  Pulse: 82   Resp: 18   Temp: 98.2 F (36.8 C)   SpO2: 100%    Filed Weights   06/18/19 0827  Weight: 184 lb 8 oz (83.7 kg)    GENERAL:alert, no distress and comfortable SKIN: no rash. Palms with hyperpigmentation EYES: sclera clear NECK: without mass LUNGS: clear with normal breathing effort HEART: regular rate & rhythm, no lower extremity edema ABDOMEN: abdomen soft, non-tender and normal bowel sounds NEURO: alert & oriented x 3 with fluent speech, normal gait PAC without erythema   LABORATORY DATA:  I have reviewed the data as listed CBC Latest Ref Rng & Units 06/18/2019 06/04/2019  05/21/2019  WBC 4.0 - 10.5 K/uL 4.5 4.3 5.2  Hemoglobin 12.0 - 15.0 g/dL 10.7(L) 10.7(L) 10.8(L)  Hematocrit 36.0 - 46.0 % 33.7(L) 33.9(L) 34.1(L)  Platelets 150 - 400 K/uL 142(L) 137(L) 152     CMP Latest Ref Rng & Units 06/18/2019 06/04/2019 05/21/2019  Glucose 70 - 99 mg/dL 87 83 100(H)  BUN 6 - 20 mg/dL 21(H) 19 19  Creatinine 0.44 - 1.00 mg/dL 0.88 0.83 0.81  Sodium 135 - 145 mmol/L 139 140 141  Potassium 3.5 - 5.1 mmol/L 4.4 5.0 4.1  Chloride 98 - 111 mmol/L 109 109 111  CO2 22 - 32 mmol/L 23 25 22   Calcium 8.9 - 10.3 mg/dL 9.5 9.5 9.2  Total Protein 6.5 - 8.1 g/dL 7.1 7.1 7.2  Total Bilirubin 0.3 - 1.2 mg/dL 0.2(L) 0.3 <0.2(L)  Alkaline Phos 38 - 126 U/L 82 98 95  AST 15 - 41 U/L 23 22 20   ALT 0 - 44 U/L 14 16 15       RADIOGRAPHIC STUDIES: I have personally reviewed the radiological images as listed and agreed with the findings in the report. No results found.   ASSESSMENT & PLAN: BRYSTOL WASILEWSKI a 58y.o.femalewith   1. Adenocarcinoma of transverse colon, moderately differentiated, pT4aN1aM1a stage IV with liverand nodalmetastasis, MMR normal  -Diagnosed in 01/2019 afteremergent colectomy and liver biopsy. 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  -PET from 02/26/19 showsknown liver metastasis and metastatic lymphadenopathy in chest and right axilla.  -[name] shows MSI stable disease, Kras+ I reviewed this with her. No targetable mutation, she is not a candidate for EGFR or immunotherapy. -Began palliative first line chemo, received 5FU/leuc with first 2 cycles for delayed wound healing, she started full dose FOLFOX and avastin with cycle 2, s/p 7 cycles of treatment   2. Nutrition/Anorexia  -she reportedly weighed 253 lbs a few years ago, and weighed 203 lbs at symptom onset~11/2018 -190 lbsin 02/2019.  -Continue to f/u withdietician -she has been able to gain weight on treatment -stable lately  3. Abdominal  wound -She hada large open midline incision after surgery, closed with wound vac. -she received 5FU/leuc for first 2 cycles while her wound healed -it completely closed and she started FOLFOX/avastin. No concerns today  4. 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 -ContinueBID  5. HTN -on hydralazine, isosorbide, lisinopril, metoprolol.  -BP elevated in clinic, likely contributed by anxiety and avastin.  -her meter at home is not working -PCP rescheduled f/u to April, I suggest she call today while she's here in infusion  -BP 190/79 today, elevated but within parameters for Avastin. If Ur  protein is OK, proceed with avastin today -May need to add amlodipine if BP continues to be elevated  6. Social support -She worked in [employer] at 2 different jobs, but has been out of work lately due to her symptoms  -She is not getting disability, no pay, but her employer kept her on their insurance plan -has been referred to SW to discuss financial option/resources  7. Goals of care discussion -Goal is palliative, to control disease and prolong her life.  -we have discussed her cancer is not curable at this stage, and that we will continue treatment as long as she can tolerate and her disease is controlled. She understands. -full code  Disposition:  Ms. Gartley appears stable. She completed cycle 7 of FOLFOX/avastin. She tolerates treatment well overall, with mild cold sensitivity and nausea. She recovers well. Labs reviewed, CBC and CMP are adequate for treatment. Urine shows trace amount of protein. She will proceed with cycle 8 FOLFOX and Avastin today. CEA is stable overall. Iron studies adequate on oral iron which she continues BID.   Her BP is trending up, likely related to Avastin. I encouraged her to call PCP today. May need to add amlodipine if BP continues to  elevate. Will watch carefully. I recommend low salt diet.   She will return for pump d/c on 3/17 then f/u and next cycle in 2 weeks.   All questions were answered. The patient knows to call the clinic with any problems, questions or concerns. No barriers to learning were detected.     [name], NP 06/18/19

## 2019-06-18 ENCOUNTER — Inpatient Hospital Stay: Payer: BC Managed Care – PPO

## 2019-06-18 ENCOUNTER — Telehealth: Payer: Self-pay | Admitting: Nurse Practitioner

## 2019-06-18 ENCOUNTER — Encounter: Payer: Self-pay | Admitting: Nurse Practitioner

## 2019-06-18 ENCOUNTER — Other Ambulatory Visit: Payer: Self-pay

## 2019-06-18 ENCOUNTER — Inpatient Hospital Stay (HOSPITAL_BASED_OUTPATIENT_CLINIC_OR_DEPARTMENT_OTHER): Payer: BC Managed Care – PPO | Admitting: Nurse Practitioner

## 2019-06-18 VITALS — BP 155/69

## 2019-06-18 VITALS — BP 190/79 | HR 82 | Temp 98.2°F | Resp 18 | Ht 67.0 in | Wt 184.5 lb

## 2019-06-18 DIAGNOSIS — Z5112 Encounter for antineoplastic immunotherapy: Secondary | ICD-10-CM | POA: Diagnosis not present

## 2019-06-18 DIAGNOSIS — Z7189 Other specified counseling: Secondary | ICD-10-CM

## 2019-06-18 DIAGNOSIS — C189 Malignant neoplasm of colon, unspecified: Secondary | ICD-10-CM

## 2019-06-18 DIAGNOSIS — C773 Secondary and unspecified malignant neoplasm of axilla and upper limb lymph nodes: Secondary | ICD-10-CM | POA: Diagnosis not present

## 2019-06-18 DIAGNOSIS — D509 Iron deficiency anemia, unspecified: Secondary | ICD-10-CM | POA: Diagnosis not present

## 2019-06-18 DIAGNOSIS — Z95828 Presence of other vascular implants and grafts: Secondary | ICD-10-CM

## 2019-06-18 DIAGNOSIS — C184 Malignant neoplasm of transverse colon: Secondary | ICD-10-CM | POA: Diagnosis not present

## 2019-06-18 DIAGNOSIS — Z5111 Encounter for antineoplastic chemotherapy: Secondary | ICD-10-CM | POA: Diagnosis not present

## 2019-06-18 DIAGNOSIS — C787 Secondary malignant neoplasm of liver and intrahepatic bile duct: Secondary | ICD-10-CM | POA: Diagnosis not present

## 2019-06-18 LAB — CBC WITH DIFFERENTIAL (CANCER CENTER ONLY)
Abs Immature Granulocytes: 0.01 10*3/uL (ref 0.00–0.07)
Basophils Absolute: 0 10*3/uL (ref 0.0–0.1)
Basophils Relative: 1 %
Eosinophils Absolute: 0.1 10*3/uL (ref 0.0–0.5)
Eosinophils Relative: 3 %
HCT: 33.7 % — ABNORMAL LOW (ref 36.0–46.0)
Hemoglobin: 10.7 g/dL — ABNORMAL LOW (ref 12.0–15.0)
Immature Granulocytes: 0 %
Lymphocytes Relative: 40 %
Lymphs Abs: 1.8 10*3/uL (ref 0.7–4.0)
MCH: 28.8 pg (ref 26.0–34.0)
MCHC: 31.8 g/dL (ref 30.0–36.0)
MCV: 90.6 fL (ref 80.0–100.0)
Monocytes Absolute: 0.5 10*3/uL (ref 0.1–1.0)
Monocytes Relative: 10 %
Neutro Abs: 2 10*3/uL (ref 1.7–7.7)
Neutrophils Relative %: 46 %
Platelet Count: 142 10*3/uL — ABNORMAL LOW (ref 150–400)
RBC: 3.72 MIL/uL — ABNORMAL LOW (ref 3.87–5.11)
RDW: 17.9 % — ABNORMAL HIGH (ref 11.5–15.5)
WBC Count: 4.5 10*3/uL (ref 4.0–10.5)
nRBC: 0 % (ref 0.0–0.2)

## 2019-06-18 LAB — CMP (CANCER CENTER ONLY)
ALT: 14 U/L (ref 0–44)
AST: 23 U/L (ref 15–41)
Albumin: 3.5 g/dL (ref 3.5–5.0)
Alkaline Phosphatase: 82 U/L (ref 38–126)
Anion gap: 7 (ref 5–15)
BUN: 21 mg/dL — ABNORMAL HIGH (ref 6–20)
CO2: 23 mmol/L (ref 22–32)
Calcium: 9.5 mg/dL (ref 8.9–10.3)
Chloride: 109 mmol/L (ref 98–111)
Creatinine: 0.88 mg/dL (ref 0.44–1.00)
GFR, Est AFR Am: >60 mL/min (ref >60)
GFR, Estimated: >60 mL/min (ref >60)
Glucose, Bld: 87 mg/dL (ref 70–99)
Potassium: 4.4 mmol/L (ref 3.5–5.1)
Sodium: 139 mmol/L (ref 135–145)
Total Bilirubin: 0.2 mg/dL — ABNORMAL LOW (ref 0.3–1.2)
Total Protein: 7.1 g/dL (ref 6.5–8.1)

## 2019-06-18 LAB — IRON AND TIBC
Iron: 80 ug/dL (ref 41–142)
Saturation Ratios: 28 % (ref 21–57)
TIBC: 285 ug/dL (ref 236–444)
UIBC: 205 ug/dL (ref 120–384)

## 2019-06-18 LAB — CEA (IN HOUSE-CHCC): CEA (CHCC-In House): 65.98 ng/mL — ABNORMAL HIGH (ref 0.00–5.00)

## 2019-06-18 LAB — TOTAL PROTEIN, URINE DIPSTICK

## 2019-06-18 LAB — FERRITIN: Ferritin: 95 ng/mL (ref 11–307)

## 2019-06-18 MED ORDER — SODIUM CHLORIDE 0.9% FLUSH
10.0000 mL | INTRAVENOUS | Status: DC | PRN
  Administered 2019-06-18: 10 mL
  Filled 2019-06-18: qty 10

## 2019-06-18 MED ORDER — PALONOSETRON HCL INJECTION 0.25 MG/5ML
INTRAVENOUS | Status: AC
  Filled 2019-06-18: qty 5

## 2019-06-18 MED ORDER — LEUCOVORIN CALCIUM INJECTION 350 MG
400.0000 mg/m2 | Freq: Once | INTRAVENOUS | Status: AC
  Administered 2019-06-18: 808 mg via INTRAVENOUS
  Filled 2019-06-18: qty 40.4

## 2019-06-18 MED ORDER — DEXAMETHASONE SODIUM PHOSPHATE 10 MG/ML IJ SOLN
INTRAMUSCULAR | Status: AC
  Filled 2019-06-18: qty 1

## 2019-06-18 MED ORDER — HEPARIN SOD (PORK) LOCK FLUSH 100 UNIT/ML IV SOLN
500.0000 [IU] | Freq: Once | INTRAVENOUS | Status: DC | PRN
  Filled 2019-06-18: qty 5

## 2019-06-18 MED ORDER — PALONOSETRON HCL INJECTION 0.25 MG/5ML
0.2500 mg | Freq: Once | INTRAVENOUS | Status: AC
  Administered 2019-06-18: 0.25 mg via INTRAVENOUS

## 2019-06-18 MED ORDER — SODIUM CHLORIDE 0.9 % IV SOLN
2470.0000 mg/m2 | INTRAVENOUS | Status: DC
  Administered 2019-06-18: 5000 mg via INTRAVENOUS
  Filled 2019-06-18: qty 100

## 2019-06-18 MED ORDER — SODIUM CHLORIDE 0.9 % IV SOLN
5.0000 mg/kg | Freq: Once | INTRAVENOUS | Status: AC
  Administered 2019-06-18: 400 mg via INTRAVENOUS
  Filled 2019-06-18: qty 16

## 2019-06-18 MED ORDER — DEXAMETHASONE SODIUM PHOSPHATE 10 MG/ML IJ SOLN
10.0000 mg | Freq: Once | INTRAMUSCULAR | Status: AC
  Administered 2019-06-18: 10 mg via INTRAVENOUS

## 2019-06-18 MED ORDER — OXALIPLATIN CHEMO INJECTION 100 MG/20ML
85.0000 mg/m2 | Freq: Once | INTRAVENOUS | Status: AC
  Administered 2019-06-18: 170 mg via INTRAVENOUS
  Filled 2019-06-18: qty 34

## 2019-06-18 MED ORDER — DEXTROSE 5 % IV SOLN
Freq: Once | INTRAVENOUS | Status: AC
  Filled 2019-06-18: qty 250

## 2019-06-18 NOTE — Telephone Encounter (Signed)
Scheduled appt per 3/15 los

## 2019-06-18 NOTE — Patient Instructions (Signed)
[facility] Discharge Instructions for Patients Receiving Chemotherapy  Today you received the following chemotherapy agents: bevacizumab, oxaliplatin, leucovorin, and fluorouracil.  To help prevent nausea and vomiting after your treatment, we encourage you to take your nausea medication as directed.   If you develop nausea and vomiting that is not controlled by your nausea medication, call the clinic.   BELOW ARE SYMPTOMS THAT SHOULD BE REPORTED IMMEDIATELY:  *FEVER GREATER THAN 100.5 F  *CHILLS WITH OR WITHOUT FEVER  NAUSEA AND VOMITING THAT IS NOT CONTROLLED WITH YOUR NAUSEA MEDICATION  *UNUSUAL SHORTNESS OF BREATH  *UNUSUAL BRUISING OR BLEEDING  TENDERNESS IN MOUTH AND THROAT WITH OR WITHOUT PRESENCE OF ULCERS  *URINARY PROBLEMS  *BOWEL PROBLEMS  UNUSUAL RASH Items with * indicate a potential emergency and should be followed up as soon as possible.  Feel free to call the clinic should you have any questions or concerns. The clinic phone number is [phone].  Please show the [place] at check-in to the Emergency Department and triage nurse.

## 2019-06-20 ENCOUNTER — Inpatient Hospital Stay: Payer: BC Managed Care – PPO

## 2019-06-20 ENCOUNTER — Other Ambulatory Visit: Payer: Self-pay

## 2019-06-20 VITALS — BP 198/87 | HR 50 | Temp 98.5°F | Resp 18

## 2019-06-20 DIAGNOSIS — C184 Malignant neoplasm of transverse colon: Secondary | ICD-10-CM | POA: Diagnosis not present

## 2019-06-20 DIAGNOSIS — Z5111 Encounter for antineoplastic chemotherapy: Secondary | ICD-10-CM | POA: Diagnosis not present

## 2019-06-20 DIAGNOSIS — C189 Malignant neoplasm of colon, unspecified: Secondary | ICD-10-CM

## 2019-06-20 DIAGNOSIS — Z7189 Other specified counseling: Secondary | ICD-10-CM

## 2019-06-20 DIAGNOSIS — D509 Iron deficiency anemia, unspecified: Secondary | ICD-10-CM | POA: Diagnosis not present

## 2019-06-20 DIAGNOSIS — C787 Secondary malignant neoplasm of liver and intrahepatic bile duct: Secondary | ICD-10-CM | POA: Diagnosis not present

## 2019-06-20 DIAGNOSIS — Z5112 Encounter for antineoplastic immunotherapy: Secondary | ICD-10-CM | POA: Diagnosis not present

## 2019-06-20 DIAGNOSIS — C773 Secondary and unspecified malignant neoplasm of axilla and upper limb lymph nodes: Secondary | ICD-10-CM | POA: Diagnosis not present

## 2019-06-20 MED ORDER — HEPARIN SOD (PORK) LOCK FLUSH 100 UNIT/ML IV SOLN
500.0000 [IU] | Freq: Once | INTRAVENOUS | Status: AC | PRN
  Administered 2019-06-20: 500 [IU]
  Filled 2019-06-20: qty 5

## 2019-06-20 MED ORDER — SODIUM CHLORIDE 0.9% FLUSH
10.0000 mL | INTRAVENOUS | Status: DC | PRN
  Administered 2019-06-20: 10 mL
  Filled 2019-06-20: qty 10

## 2019-06-20 NOTE — Patient Instructions (Signed)

## 2019-06-22 ENCOUNTER — Other Ambulatory Visit: Payer: Self-pay | Admitting: Internal Medicine

## 2019-06-22 MED ORDER — HYDRALAZINE HCL 25 MG PO TABS: 25.0000 mg | ORAL_TABLET | Freq: Three times a day (TID) | ORAL | 0 refills | Status: DC

## 2019-06-22 NOTE — Telephone Encounter (Signed)
Pt is requesting a refill on Hydralazine 25 mg a. Pt accidentally threw the medication in the garbage and has not taken medication in 3 days. Pt uses [facility] in [address]). Thanks

## 2019-06-27 ENCOUNTER — Other Ambulatory Visit: Payer: Self-pay | Admitting: Internal Medicine

## 2019-07-01 NOTE — Progress Notes (Signed)
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 Bowel-gas pattern consistent with small bowel obstruction. No free air. 2. No acute chest findings.   01/23/2019 Imaging   CT AP IMPRESSION: Obstructing mid transverse colonic mass with mild regional adenopathy and hepatic metastatic disease. The mass likely extends through the serosa; no ascites or peritoneal nodularity.   01/24/2019 Surgery   Surgeon: Clovis Riley MD Assistant: [name] Procedure performed: Transverse colectomy with end colostomy, liver biopsy Procedure classification: URGENT/EMERGENT Preop diagnosis: Obstructing, metastatic transverse colon mass Post-op diagnosis/intraop findings: Same   01/24/2019 Pathology Results   FINAL MICROSCOPIC DIAGNOSIS:   A. COLON, TRANSVERSE, RESECTION:  Colonic adenocarcinoma, 5 cm.  Carcinoma extends into pericolonic connective tissue and focally to  serosal surface.  Margins not involved.  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   B. LIVER NODULE, LEFT, BIOPSY:  Metastatic adenocarcinoma.    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 Hypermetabolic metastatic disease in the liver and mediastinal/hilar/axillary lymph nodes. 2. Focal hypermetabolism in the rectum. Continued attention on follow-up exams is warranted. 3. Focal hypermetabolism medial to the right adrenal gland may be within a metastatic lymph node, better visualized on 01/23/2019. 4. Aortic atherosclerosis (ICD10-170.0). Coronary artery calcification.   03/12/2019 -  Chemotherapy   She started 5FU q2weeks on 03/12/19 for 2 cycles. She started full dose FOLFOX with Avastin on 04/09/19.   05/31/2019 Imaging   Restaging CT CAP IMPRESSION: 1. Similar to mild interval decrease in size of multiple hepatic lesions, partially calcified. 2. 2 mm right upper lobe pulmonary nodule. Recommend attention on follow-up. 3. Emphysema and aortic atherosclerosis.     CURRENT THERAPY:  She started 5FU q2weeks on 03/12/19 for 2 cycles. She started full dose FOLFOX with Avastin on 04/09/19.  INTERVAL HISTORY: Ms. Music returns for f/u and treatment as scheduled. She completed cycle 8 treatment on 06/18/19. She is doing very well. Energy and appetite are normal for her. Cold sensitivity lasts 5 days, no residual neuropathy. She has mild nausea from days 3-5, no emesis. meds help. Denies constipation or diarrhea. Stoma is slightly prolapsed, no pain. Otherwise, denies fever, chills, cough, chest pain, dyspnea, leg swelling, bleeding, mucositis, or new concerns.   MEDICAL HISTORY:  Past Medical History:  Diagnosis Date  . Anemia    low iron  . Cancer [facility])    colon cancer  . Hypertension 01/23/2019  . SBO (small bowel obstruction) ([place]) 01/23/2019    SURGICAL HISTORY: Past Surgical History:  Procedure Laterality Date  . CESAREAN SECTION     x2  .  COLOSTOMY N/A 01/24/2019   Procedure: End Loop Colostomy;  Surgeon: Clovis Riley, MD;  Location: [place];  Service: General;  Laterality: N/A;  . PARTIAL COLECTOMY N/A 01/24/2019   Procedure: PARTIAL COLECTOMY;  Surgeon: Clovis Riley, MD;  Location: [place];  Service: General;  Laterality: N/A;  . PORTACATH PLACEMENT Right 02/28/2019   Procedure: INSERTION PORT-A-CATH WITH ULTRASOUND GUIDANCE;  Surgeon: Clovis Riley, MD;  Location: [place];  Service: General;  Laterality: Right;    I have reviewed the social history and family history with the patient and they are unchanged from previous note.  ALLERGIES:  has No Known Allergies.  MEDICATIONS:  Current Outpatient Medications  Medication Sig Dispense Refill  . acetaminophen (TYLENOL) 325 MG tablet Take 2 tablets (650 mg total) by mouth every 6 (six) hours as needed.    . ferrous sulfate 325 (65 FE) MG tablet TAKE 1 TABLET (325 MG TOTAL) BY MOUTH 2 (TWO) TIMES DAILY WITH A MEAL. 180 tablet 1  . hydrALAZINE (APRESOLINE) 25 MG tablet TAKE 1 TABLET (25 MG TOTAL) BY MOUTH EVERY 8 (EIGHT) HOURS. 33 tablet 2  . Ibuprofen (ADVIL) 200 MG CAPS Take 400 mg by mouth daily as needed (pain).    . isosorbide mononitrate (IMDUR) 30 MG 24 hr tablet TAKE 1 TABLET BY MOUTH EVERY DAY 90 tablet 0  . lidocaine-prilocaine (EMLA) cream Apply to affected area once (Patient taking differently: Apply 1 application topically once. Apply to affected area once) 30 g 3  . lisinopril (ZESTRIL) 40 MG tablet TAKE 1 TABLET BY MOUTH EVERY DAY 90 tablet 1  . loperamide (IMODIUM) 2 MG capsule Take 1 capsule (2 mg total) by mouth as needed for diarrhea or loose stools. 90 capsule 0  . metoprolol tartrate (LOPRESSOR) 50 MG tablet TAKE 1 TABLET BY MOUTH TWICE A DAY 180 tablet 1  . ondansetron (ZOFRAN) 4 MG tablet Take 1 tablet (4 mg total) by mouth every 6 (six) hours as needed for nausea. 30 tablet 0  . ondansetron (ZOFRAN) 8 MG tablet Take 1 tablet (8 mg total) by mouth 2  (two) times daily as needed for refractory nausea / vomiting. Start on day 3 after chemotherapy. 30 tablet 1  . polycarbophil (FIBERCON) 625 MG tablet Take 1 tablet (625 mg total) by mouth daily. 30 tablet 0  . prochlorperazine (COMPAZINE) 10 MG tablet Take 1 tablet (10 mg total) by mouth every 6 (six) hours as needed (Nausea or vomiting). 30 tablet 1   No current facility-administered medications for this visit.    PHYSICAL EXAMINATION: ECOG PERFORMANCE STATUS: 1 - Symptomatic but completely ambulatory  Vitals:   07/02/19 0914 07/02/19 0916  BP: (!) 206/73 (!) 194/72  Pulse: (!) 50   Resp: 18   Temp: 98.5 F (36.9 C)   SpO2: 100%    Filed Weights   07/02/19 0914  Weight: 189 lb 8 oz (86 kg)    GENERAL:alert, no distress and comfortable SKIN: no rash. Palms with hyperpigmentation EYES:  sclera clear LUNGS: clear with normal breathing effort HEART: regular rate & rhythm, no lower extremity edema ABDOMEN:abdomen soft, non-tender and normal bowel sounds. Prolapsed stoma NEURO: alert & oriented x 3 with fluent speech, normal gait PAC without erythema   LABORATORY DATA:  I have reviewed the data as listed CBC Latest Ref Rng & Units 07/02/2019 06/18/2019 06/04/2019  WBC 4.0 - 10.5 K/uL 4.4 4.5  4.3  Hemoglobin 12.0 - 15.0 g/dL 10.5(L) 10.7(L) 10.7(L)  Hematocrit 36.0 - 46.0 % 32.6(L) 33.7(L) 33.9(L)  Platelets 150 - 400 K/uL 134(L) 142(L) 137(L)     CMP Latest Ref Rng & Units 07/02/2019 06/18/2019 06/04/2019  Glucose 70 - 99 mg/dL 88 87 83  BUN 6 - 20 mg/dL 25(H) 21(H) 19  Creatinine 0.44 - 1.00 mg/dL 0.86 0.88 0.83  Sodium 135 - 145 mmol/L 142 139 140  Potassium 3.5 - 5.1 mmol/L 4.3 4.4 5.0  Chloride 98 - 111 mmol/L 113(H) 109 109  CO2 22 - 32 mmol/L 22 23 25   Calcium 8.9 - 10.3 mg/dL 9.1 9.5 9.5  Total Protein 6.5 - 8.1 g/dL 6.9 7.1 7.1  Total Bilirubin 0.3 - 1.2 mg/dL 0.2(L) 0.2(L) 0.3  Alkaline Phos 38 - 126 U/L 87 82 98  AST 15 - 41 U/L 25 23 22   ALT 0 - 44 U/L 19 14 16         RADIOGRAPHIC STUDIES: I have personally reviewed the radiological images as listed and agreed with the findings in the report. No results found.   ASSESSMENT & PLAN: Casey Black a 58y.o.femalewith   1. Adenocarcinoma of transverse colon, moderately differentiated, pT4aN1aM1a stage IV with liverand nodalmetastasis, MMR normal  -Diagnosed in 01/2019 afteremergent colectomy and liver biopsy. 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  -PET from 02/26/19 showsknown liver metastasis and metastatic lymphadenopathy in chest and right axilla.  -[name] shows MSI stable disease, Kras+ No targetable mutation, she is not a candidate for EGFR or immunotherapy. -Began palliative first line chemo, received 5FU/leuc with first 2 cycles for delayed wound healing, she started full dose FOLFOX and avastin with cycle 2, s/p 8 cycles of treatment   2.Nutrition/Anorexia  -she reportedly weighed 253 lbs a few years ago, and weighed 203 lbs at symptom onset~11/2018 -190 lbsin 02/2019.  -Continue to f/u withdietician -she has been able to gain weight on treatment -stable lately  3. Abdominal wound -She hada large open midline incision after surgery, closed with wound vac. -she received 5FU/leuc for first 2 cycles while her wound healed -it completely closed and she started FOLFOX/avastin. No concerns today  4. 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 -ContinueBID  5. HTN -on hydralazine, isosorbide, lisinopril, metoprolol.  -BPelevated in clinic, likely contributed by anxiety and avastin. Improves on day 3 with pump d/c only . -her meter at home is not working -PCP rescheduled f/u to April, I have recommended she call for sooner appointment  -BP 190/74 today, elevated but within parameters for Avastin. Ur protein negative.   -May need to add amlodipine if BP continues to be elevated  6. Social support -She worked in [employer] at 2 different jobs, but has been out of work lately due to her symptoms  -She is not getting disability, no pay, but her employer kept her on their insurance plan -has been referred to SW to discuss financial option/resources  7. Goals of care discussion -Goal is palliative, to control disease and prolong her life. -we have discussedher cancer is not curable at this stage, and that we will continue treatment as long as she can tolerate and her disease is controlled.She understands. -full code  Disposition:  Ms. Fruchter appears stable. She completed 8 cycles of treatment. She tolerates well overall with mild nausea and cold sensitivity lasting 5 days. She  recovers well. Labs and BP reviewed. She will proceed with another cycle of FOLFOX and avastin today. She will return for f/u and next cycle in 2 weeks.   All questions were answered. The patient knows to call the clinic with any problems, questions or concerns. No barriers to learning were detected.     [name], NP 07/02/19

## 2019-07-02 ENCOUNTER — Other Ambulatory Visit: Payer: Self-pay | Admitting: Hematology

## 2019-07-02 ENCOUNTER — Inpatient Hospital Stay (HOSPITAL_BASED_OUTPATIENT_CLINIC_OR_DEPARTMENT_OTHER): Payer: BC Managed Care – PPO | Admitting: Nurse Practitioner

## 2019-07-02 ENCOUNTER — Inpatient Hospital Stay: Payer: BC Managed Care – PPO

## 2019-07-02 ENCOUNTER — Encounter: Payer: Self-pay | Admitting: Nurse Practitioner

## 2019-07-02 ENCOUNTER — Other Ambulatory Visit: Payer: Self-pay

## 2019-07-02 ENCOUNTER — Other Ambulatory Visit: Payer: Self-pay | Admitting: Oncology

## 2019-07-02 VITALS — BP 194/72 | HR 50 | Temp 98.5°F | Resp 18 | Ht 67.0 in | Wt 189.5 lb

## 2019-07-02 VITALS — BP 190/74

## 2019-07-02 DIAGNOSIS — C189 Malignant neoplasm of colon, unspecified: Secondary | ICD-10-CM | POA: Diagnosis not present

## 2019-07-02 DIAGNOSIS — Z5111 Encounter for antineoplastic chemotherapy: Secondary | ICD-10-CM | POA: Diagnosis not present

## 2019-07-02 DIAGNOSIS — C787 Secondary malignant neoplasm of liver and intrahepatic bile duct: Secondary | ICD-10-CM

## 2019-07-02 DIAGNOSIS — Z95828 Presence of other vascular implants and grafts: Secondary | ICD-10-CM

## 2019-07-02 DIAGNOSIS — D509 Iron deficiency anemia, unspecified: Secondary | ICD-10-CM | POA: Diagnosis not present

## 2019-07-02 DIAGNOSIS — C773 Secondary and unspecified malignant neoplasm of axilla and upper limb lymph nodes: Secondary | ICD-10-CM | POA: Diagnosis not present

## 2019-07-02 DIAGNOSIS — C184 Malignant neoplasm of transverse colon: Secondary | ICD-10-CM | POA: Diagnosis not present

## 2019-07-02 DIAGNOSIS — Z7189 Other specified counseling: Secondary | ICD-10-CM

## 2019-07-02 DIAGNOSIS — Z5112 Encounter for antineoplastic immunotherapy: Secondary | ICD-10-CM | POA: Diagnosis not present

## 2019-07-02 LAB — CBC WITH DIFFERENTIAL (CANCER CENTER ONLY)
Abs Immature Granulocytes: 0.01 10*3/uL (ref 0.00–0.07)
Basophils Absolute: 0 10*3/uL (ref 0.0–0.1)
Basophils Relative: 1 %
Eosinophils Absolute: 0.1 10*3/uL (ref 0.0–0.5)
Eosinophils Relative: 3 %
HCT: 32.6 % — ABNORMAL LOW (ref 36.0–46.0)
Hemoglobin: 10.5 g/dL — ABNORMAL LOW (ref 12.0–15.0)
Immature Granulocytes: 0 %
Lymphocytes Relative: 38 %
Lymphs Abs: 1.7 10*3/uL (ref 0.7–4.0)
MCH: 29.2 pg (ref 26.0–34.0)
MCHC: 32.2 g/dL (ref 30.0–36.0)
MCV: 90.8 fL (ref 80.0–100.0)
Monocytes Absolute: 0.6 10*3/uL (ref 0.1–1.0)
Monocytes Relative: 13 %
Neutro Abs: 2 10*3/uL (ref 1.7–7.7)
Neutrophils Relative %: 45 %
Platelet Count: 134 10*3/uL — ABNORMAL LOW (ref 150–400)
RBC: 3.59 MIL/uL — ABNORMAL LOW (ref 3.87–5.11)
RDW: 18.1 % — ABNORMAL HIGH (ref 11.5–15.5)
WBC Count: 4.4 10*3/uL (ref 4.0–10.5)
nRBC: 0 % (ref 0.0–0.2)

## 2019-07-02 LAB — CMP (CANCER CENTER ONLY)
ALT: 19 U/L (ref 0–44)
AST: 25 U/L (ref 15–41)
Albumin: 3.4 g/dL — ABNORMAL LOW (ref 3.5–5.0)
Alkaline Phosphatase: 87 U/L (ref 38–126)
Anion gap: 7 (ref 5–15)
BUN: 25 mg/dL — ABNORMAL HIGH (ref 6–20)
CO2: 22 mmol/L (ref 22–32)
Calcium: 9.1 mg/dL (ref 8.9–10.3)
Chloride: 113 mmol/L — ABNORMAL HIGH (ref 98–111)
Creatinine: 0.86 mg/dL (ref 0.44–1.00)
GFR, Est AFR Am: >60 mL/min (ref >60)
GFR, Estimated: >60 mL/min (ref >60)
Glucose, Bld: 88 mg/dL (ref 70–99)
Potassium: 4.3 mmol/L (ref 3.5–5.1)
Sodium: 142 mmol/L (ref 135–145)
Total Bilirubin: 0.2 mg/dL — ABNORMAL LOW (ref 0.3–1.2)
Total Protein: 6.9 g/dL (ref 6.5–8.1)

## 2019-07-02 LAB — TOTAL PROTEIN, URINE DIPSTICK: Protein, ur: NEGATIVE mg/dL

## 2019-07-02 MED ORDER — SODIUM CHLORIDE 0.9 % IV SOLN
5.0000 mg/kg | Freq: Once | INTRAVENOUS | Status: AC
  Administered 2019-07-02: 400 mg via INTRAVENOUS
  Filled 2019-07-02: qty 16

## 2019-07-02 MED ORDER — LEUCOVORIN CALCIUM INJECTION 350 MG
400.0000 mg/m2 | Freq: Once | INTRAVENOUS | Status: AC
  Administered 2019-07-02: 808 mg via INTRAVENOUS
  Filled 2019-07-02: qty 40.4

## 2019-07-02 MED ORDER — SODIUM CHLORIDE 0.9 % IV SOLN
2470.0000 mg/m2 | INTRAVENOUS | Status: DC
  Administered 2019-07-02: 5000 mg via INTRAVENOUS
  Filled 2019-07-02: qty 100

## 2019-07-02 MED ORDER — DEXAMETHASONE SODIUM PHOSPHATE 10 MG/ML IJ SOLN
10.0000 mg | Freq: Once | INTRAMUSCULAR | Status: AC
  Administered 2019-07-02: 10 mg via INTRAVENOUS

## 2019-07-02 MED ORDER — OXALIPLATIN CHEMO INJECTION 100 MG/20ML
85.0000 mg/m2 | Freq: Once | INTRAVENOUS | Status: AC
  Administered 2019-07-02: 170 mg via INTRAVENOUS
  Filled 2019-07-02: qty 34

## 2019-07-02 MED ORDER — SODIUM CHLORIDE 0.9% FLUSH
10.0000 mL | INTRAVENOUS | Status: DC | PRN
  Administered 2019-07-02: 10 mL
  Filled 2019-07-02: qty 10

## 2019-07-02 MED ORDER — PALONOSETRON HCL INJECTION 0.25 MG/5ML
0.2500 mg | Freq: Once | INTRAVENOUS | Status: AC
  Administered 2019-07-02: 0.25 mg via INTRAVENOUS

## 2019-07-02 MED ORDER — PALONOSETRON HCL INJECTION 0.25 MG/5ML
INTRAVENOUS | Status: AC
  Filled 2019-07-02: qty 5

## 2019-07-02 MED ORDER — DEXTROSE 5 % IV SOLN
Freq: Once | INTRAVENOUS | Status: AC
  Filled 2019-07-02: qty 250

## 2019-07-02 MED ORDER — DEXAMETHASONE SODIUM PHOSPHATE 10 MG/ML IJ SOLN
INTRAMUSCULAR | Status: AC
  Filled 2019-07-02: qty 1

## 2019-07-02 NOTE — Progress Notes (Signed)
Per Cira Rue NP, ok to treat with elevated BP.

## 2019-07-02 NOTE — Patient Instructions (Signed)
[facility] Discharge Instructions for Patients Receiving Chemotherapy  Today you received the following chemotherapy agents: oxaliplatin, leucovorin, fluorouracil, and bevacizumab.  To help prevent nausea and vomiting after your treatment, we encourage you to take your nausea medication as directed.   If you develop nausea and vomiting that is not controlled by your nausea medication, call the clinic.   BELOW ARE SYMPTOMS THAT SHOULD BE REPORTED IMMEDIATELY:  *FEVER GREATER THAN 100.5 F  *CHILLS WITH OR WITHOUT FEVER  NAUSEA AND VOMITING THAT IS NOT CONTROLLED WITH YOUR NAUSEA MEDICATION  *UNUSUAL SHORTNESS OF BREATH  *UNUSUAL BRUISING OR BLEEDING  TENDERNESS IN MOUTH AND THROAT WITH OR WITHOUT PRESENCE OF ULCERS  *URINARY PROBLEMS  *BOWEL PROBLEMS  UNUSUAL RASH Items with * indicate a potential emergency and should be followed up as soon as possible.  Feel free to call the clinic should you have any questions or concerns. The clinic phone number is [phone].  Please show the [place] at check-in to the Emergency Department and triage nurse.

## 2019-07-04 ENCOUNTER — Inpatient Hospital Stay: Payer: BC Managed Care – PPO

## 2019-07-04 ENCOUNTER — Other Ambulatory Visit: Payer: Self-pay

## 2019-07-04 VITALS — BP 202/68 | HR 58 | Temp 99.1°F

## 2019-07-04 DIAGNOSIS — C773 Secondary and unspecified malignant neoplasm of axilla and upper limb lymph nodes: Secondary | ICD-10-CM | POA: Diagnosis not present

## 2019-07-04 DIAGNOSIS — Z7189 Other specified counseling: Secondary | ICD-10-CM

## 2019-07-04 DIAGNOSIS — Z5111 Encounter for antineoplastic chemotherapy: Secondary | ICD-10-CM | POA: Diagnosis not present

## 2019-07-04 DIAGNOSIS — C189 Malignant neoplasm of colon, unspecified: Secondary | ICD-10-CM

## 2019-07-04 DIAGNOSIS — Z5112 Encounter for antineoplastic immunotherapy: Secondary | ICD-10-CM | POA: Diagnosis not present

## 2019-07-04 DIAGNOSIS — C787 Secondary malignant neoplasm of liver and intrahepatic bile duct: Secondary | ICD-10-CM

## 2019-07-04 DIAGNOSIS — D509 Iron deficiency anemia, unspecified: Secondary | ICD-10-CM | POA: Diagnosis not present

## 2019-07-04 DIAGNOSIS — C184 Malignant neoplasm of transverse colon: Secondary | ICD-10-CM | POA: Diagnosis not present

## 2019-07-04 MED ORDER — HEPARIN SOD (PORK) LOCK FLUSH 100 UNIT/ML IV SOLN
500.0000 [IU] | Freq: Once | INTRAVENOUS | Status: AC | PRN
  Administered 2019-07-04: 12:00:00 500 [IU]
  Filled 2019-07-04: qty 5

## 2019-07-04 MED ORDER — SODIUM CHLORIDE 0.9% FLUSH
10.0000 mL | INTRAVENOUS | Status: DC | PRN
  Administered 2019-07-04: 10 mL
  Filled 2019-07-04: qty 10

## 2019-07-05 ENCOUNTER — Other Ambulatory Visit: Payer: Self-pay | Admitting: Internal Medicine

## 2019-07-11 DIAGNOSIS — C189 Malignant neoplasm of colon, unspecified: Secondary | ICD-10-CM | POA: Diagnosis not present

## 2019-07-12 NOTE — Progress Notes (Signed)
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 Bowel-gas pattern consistent with small bowel obstruction. No free air. 2. No acute chest findings.   01/23/2019 Imaging   CT AP IMPRESSION: Obstructing mid transverse colonic mass with mild regional adenopathy and hepatic metastatic disease. The mass likely extends through the serosa; no ascites or peritoneal nodularity.   01/24/2019 Surgery   Surgeon: Clovis Riley MD Assistant: [name] Procedure performed: Transverse colectomy with end colostomy, liver biopsy Procedure classification: URGENT/EMERGENT Preop diagnosis: Obstructing, metastatic transverse colon mass Post-op diagnosis/intraop findings: Same   01/24/2019 Pathology Results   FINAL MICROSCOPIC DIAGNOSIS:   A. COLON, TRANSVERSE, RESECTION:  Colonic adenocarcinoma, 5 cm.  Carcinoma extends into pericolonic connective tissue and focally to  serosal surface.  Margins not involved.  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   B. LIVER NODULE, LEFT, BIOPSY:  Metastatic adenocarcinoma.    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 Hypermetabolic metastatic disease in the liver and mediastinal/hilar/axillary lymph nodes. 2. Focal hypermetabolism in the rectum. Continued attention on follow-up exams is warranted. 3. Focal hypermetabolism medial to the right adrenal gland may be within a metastatic lymph node, better visualized on 01/23/2019. 4. Aortic atherosclerosis (ICD10-170.0). Coronary artery calcification.   03/12/2019 -  Chemotherapy   She started 5FU q2weeks on 03/12/19 for 2 cycles. She started full dose FOLFOX with Avastin on 04/09/19.   05/31/2019 Imaging   Restaging CT CAP IMPRESSION: 1. Similar to mild interval decrease in size of multiple hepatic lesions, partially calcified. 2. 2 mm right upper lobe pulmonary nodule. Recommend attention on follow-up. 3. Emphysema and aortic atherosclerosis.      CURRENT THERAPY:  She started 5FU q2weeks on 03/12/19 for 2 cycles. She started full dose FOLFOX with Avastin on 04/09/19.  INTERVAL HISTORY:  Casey Black is here for a follow up and treatment. She presents to the clinic alone. She notes she is dong well. She has nausea on day 3 of treatment and resolves with antiemetics. She is able to eat well and gain weight. She notes her energy level is adequate and able to walk and exercise. She has skin darkening. She notes only temporarily cold sensitivity for 1 week, no neuropathy. She notes she sees Dr Jerilee Hoh in 4 days. She note he BP at home will be at 138 at home.     REVIEW OF SYSTEMS:   Constitutional: Denies fevers, chills or abnormal weight loss Eyes: Denies blurriness  of vision Ears, nose, mouth, throat, and face: Denies mucositis or sore throat Respiratory: Denies cough, dyspnea or wheezes Cardiovascular:  Denies palpitation, chest discomfort or lower extremity swelling Gastrointestinal:  Denies nausea, heartburn or change in bowel habits Skin: Denies abnormal skin rashes (+) Skin darkening.  Lymphatics: Denies new lymphadenopathy or easy bruising Neurological:Denies numbness, tingling or new weaknesses Behavioral/Psych: Mood is stable, no new changes  All other systems were reviewed with the patient and are negative.  MEDICAL HISTORY:  Past Medical History:  Diagnosis Date  . Anemia    low iron  . Cancer [facility])    colon cancer  . Hypertension 01/23/2019  . SBO (small bowel obstruction) ([place]) 01/23/2019    SURGICAL HISTORY: Past Surgical History:  Procedure Laterality Date  . CESAREAN SECTION     x2  . COLOSTOMY N/A 01/24/2019   Procedure: End Loop Colostomy;  Surgeon: Clovis Riley, MD;  Location: [place];  Service: General;  Laterality: N/A;  . PARTIAL COLECTOMY N/A 01/24/2019   Procedure: PARTIAL COLECTOMY;  Surgeon: Clovis Riley, MD;  Location: [place];  Service: General;  Laterality: N/A;  . PORTACATH PLACEMENT Right 02/28/2019   Procedure: INSERTION PORT-A-CATH WITH ULTRASOUND GUIDANCE;  Surgeon: Clovis Riley, MD;  Location: [place];  Service: General;  Laterality: Right;    I have reviewed the social history and family history with the patient and they are unchanged from previous note.  ALLERGIES:  has No Known Allergies.  MEDICATIONS:  Current Outpatient Medications  Medication Sig Dispense Refill  . acetaminophen (TYLENOL) 325 MG tablet Take 2 tablets (650 mg total) by mouth every 6 (six) hours as needed.    Marland Kitchen amLODipine (NORVASC) 10 MG tablet Take 1 tablet (10 mg total) by mouth daily. 30 tablet 1  . ferrous sulfate 325 (65 FE) MG tablet TAKE 1 TABLET (325 MG TOTAL) BY MOUTH 2 (TWO) TIMES DAILY WITH A MEAL. 180 tablet 1  . hydrALAZINE (APRESOLINE) 25 MG tablet TAKE 1 TABLET (25 MG TOTAL) BY MOUTH EVERY 8 (EIGHT) HOURS. 90 tablet 1  . Ibuprofen (ADVIL) 200 MG  CAPS Take 400 mg by mouth daily as needed (pain).    . isosorbide mononitrate (IMDUR) 30 MG 24 hr tablet TAKE 1 TABLET BY MOUTH EVERY DAY 90 tablet 0  . lidocaine-prilocaine (EMLA) cream Apply to affected area once (Patient taking differently: Apply 1 application topically once. Apply to affected area once) 30 g 3  . lisinopril (ZESTRIL) 40 MG tablet TAKE 1 TABLET BY MOUTH EVERY DAY 90 tablet 1  . loperamide (IMODIUM) 2 MG capsule TAKE 1 CAPSULE (2 MG TOTAL) BY MOUTH AS NEEDED FOR DIARRHEA OR LOOSE STOOLS. 90 capsule 0  . metoprolol tartrate (LOPRESSOR) 50 MG tablet TAKE 1 TABLET BY MOUTH TWICE A DAY 180 tablet 1  . ondansetron (ZOFRAN) 4 MG tablet Take 1 tablet (4 mg total) by mouth every 6 (six) hours as needed for nausea. 30 tablet 0  . ondansetron (ZOFRAN) 8 MG tablet Take 1 tablet (8 mg total) by mouth 2 (two) times daily as needed for refractory nausea / vomiting. Start on day 3 after chemotherapy. 30 tablet 1  . polycarbophil (FIBERCON) 625 MG tablet Take 1 tablet (625 mg total) by mouth daily. 30 tablet 0  . prochlorperazine (COMPAZINE) 10 MG tablet Take 1 tablet (10 mg total) by mouth every 6 (six) hours as needed (Nausea or vomiting). 30 tablet 1   No current facility-administered medications for this visit.   Facility-Administered  Medications Ordered in Other Visits  Medication Dose Route Frequency Provider Last Rate Last Admin  . 0.9 %  sodium chloride infusion   Intravenous Continuous Truitt Merle, MD   Stopped at 07/16/19 1148  . dextrose 5 % solution   Intravenous Once Truitt Merle, MD      . fluorouracil (ADRUCIL) 5,000 mg in sodium chloride 0.9 % 150 mL chemo infusion  2,470 mg/m2 (Treatment Plan Recorded) Intravenous 1 day or 1 dose Truitt Merle, MD   5,000 mg at 07/16/19 1405  . sodium chloride flush (NS) 0.9 % injection 10 mL  10 mL Intracatheter PRN Truitt Merle, MD   10 mL at 07/16/19 1402    PHYSICAL EXAMINATION: ECOG PERFORMANCE STATUS: 1 - Symptomatic but completely ambulatory   Vitals:   07/16/19 0850  BP: (!) 210/75  Pulse: 60  Resp: 20  Temp: 98.5 F (36.9 C)  SpO2: 100%   Filed Weights   07/16/19 0850  Weight: 188 lb 11.2 oz (85.6 kg)    Due to COVID19 we will limit examination to appearance. Patient had no complaints.  GENERAL:alert, no distress and comfortable SKIN: skin color normal, no rashes or significant lesions EYES: normal, Conjunctiva are pink and non-injected, sclera clear  NEURO: alert & oriented x 3 with fluent speech   LABORATORY DATA:  I have reviewed the data as listed CBC Latest Ref Rng & Units 07/16/2019 07/02/2019 06/18/2019  WBC 4.0 - 10.5 K/uL 4.6 4.4 4.5  Hemoglobin 12.0 - 15.0 g/dL 10.6(L) 10.5(L) 10.7(L)  Hematocrit 36.0 - 46.0 % 33.4(L) 32.6(L) 33.7(L)  Platelets 150 - 400 K/uL 142(L) 134(L) 142(L)     CMP Latest Ref Rng & Units 07/16/2019 07/02/2019 06/18/2019  Glucose 70 - 99 mg/dL 76 88 87  BUN 6 - 20 mg/dL 17 25(H) 21(H)  Creatinine 0.44 - 1.00 mg/dL 0.88 0.86 0.88  Sodium 135 - 145 mmol/L 140 142 139  Potassium 3.5 - 5.1 mmol/L 4.1 4.3 4.4  Chloride 98 - 111 mmol/L 111 113(H) 109  CO2 22 - 32 mmol/L 23 22 23   Calcium 8.9 - 10.3 mg/dL 9.5 9.1 9.5  Total Protein 6.5 - 8.1 g/dL 7.0 6.9 7.1  Total Bilirubin 0.3 - 1.2 mg/dL <0.2(L) 0.2(L) 0.2(L)  Alkaline Phos 38 - 126 U/L 82 87 82  AST 15 - 41 U/L 22 25 23   ALT 0 - 44 U/L 18 19 14       RADIOGRAPHIC STUDIES: I have personally reviewed the radiological images as listed and agreed with the findings in the report. No results found.   ASSESSMENT & PLAN:  Casey Black is a 59 y.o. female with    1. Adenocarcinoma of transverse colon, moderately differentiated, pT4aN1aM1a stage IV with liverand nodalmetastasis,MSS, KRAS G12S(+) -Shewas diagnosed in 01/2019 afteremergent colectomy and liver biopsy. 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 02/26/19 PET showsknown liver metastasis and metastatic lymphadenopathy in chest and right  axilla.  -We previously discussed her cancer is stage IV and no longer eligible for surgery. The likelihood of curing this is very lowdue to her diffuse metastasis, but still very treatable. -[name] shows MSI stable disease, Kras+. There are no targetable mutation, she is not a candidate for EGFRinhibitoror immunotherapy.Avastin was added with start of full dose FOLFOX3 weeks ago. -I started her on first-line5-FU for 2 cycles on 03/11/20 while her surgical wound healed. She started full doseFOLFOXwith avastin q2weeks on 1/4/21to control her disease. -S/p C9 she tolerates chemo well with cold  sensitivity, skin darkening and mild nausea. She is able to maintain and gain weight and energy.  I discussed after 6 months treatment will reduce her oxaliplatin or hold it to reduce risk of neuropathy.  -Repeat CT CAP scan after C12  -Lab reviewed, Hg 10.6, plt 142K. Overall adequate to proceed with C10 Folfox and Avastin.  -f/u in 2 weeks   2. Anorexia, resolved  -She reportedly weighed 253 lbs a few years ago, and weighed 203 lbs at symptom onset~11/2018 -190 lbsin 02/2019.  -Continue to f/u withdietician.  -She has been eating well and walking often. She is able to maintain appetite and strength. Weight continues to trend up. I encouraged her to continue.  -she has gain some weight lately    3. Iron deficiency anemia, and anemia secondary to cancer -She had low ferritinand low TIBC -Takes oral iron BID, will continue.Anemia continues to improve. -Will give IV iron if needed  4. HTN, uncontrolled  -on hydralazine, isosorbide, lisinopril, metoprolol. Continue medications and f/u with PCP. -I discussed avastin can increase her blood pressure.  -Her systolic BP has become very high, at 210/75 today (07/16/19). This is related to Avastin. I will call in amlodipine 40m daily (07/16/19). She is agreeable.  -I will give one dose clonidine today here    5. Social support  -She worked in [employer] 2 different jobs, but has been out of work lately due to her symptoms  -She is not getting disability, no pay, but her employer kept her on their insurance plan -She may be eligible for Medicaid -D.R. Horton, Increferred toSW to discuss   6. Goals of care discussion -The patient understands the goal of care is palliative. -I recommend DNR/DNI, she will think about it  PLAN: -I called in amlodipine today, and give one dose clonidine in infusion room  -Labs reviewed and adequate to proceed with C10FOLFOX and avastin   -Lab, flush, f/u and FOLFOX and Avastin in 2, 4, 6, weeks    No problem-specific Assessment & Plan notes found for this encounter.   No orders of the defined types were placed in this encounter.  All questions were answered. The patient knows to call the clinic with any problems, questions or concerns. No barriers to learning was detected. The total time spent in the appointment was 30 minutes.     YTruitt Merle MD 07/16/2019   I, AJoslyn Devon am acting as scribe for YTruitt Merle MD.   I have reviewed the above documentation for accuracy and completeness, and I agree with the above.

## 2019-07-13 ENCOUNTER — Other Ambulatory Visit: Payer: Self-pay | Admitting: Hematology

## 2019-07-16 ENCOUNTER — Inpatient Hospital Stay: Payer: BC Managed Care – PPO | Attending: Nurse Practitioner

## 2019-07-16 ENCOUNTER — Inpatient Hospital Stay: Payer: BC Managed Care – PPO

## 2019-07-16 ENCOUNTER — Encounter: Payer: Self-pay | Admitting: Hematology

## 2019-07-16 ENCOUNTER — Telehealth: Payer: Self-pay | Admitting: Hematology

## 2019-07-16 ENCOUNTER — Other Ambulatory Visit: Payer: Self-pay

## 2019-07-16 ENCOUNTER — Inpatient Hospital Stay (HOSPITAL_BASED_OUTPATIENT_CLINIC_OR_DEPARTMENT_OTHER): Payer: BC Managed Care – PPO | Admitting: Hematology

## 2019-07-16 VITALS — BP 174/73

## 2019-07-16 VITALS — BP 210/75 | HR 60 | Temp 98.5°F | Resp 20 | Ht 67.0 in | Wt 188.7 lb

## 2019-07-16 DIAGNOSIS — C787 Secondary malignant neoplasm of liver and intrahepatic bile duct: Secondary | ICD-10-CM | POA: Insufficient documentation

## 2019-07-16 DIAGNOSIS — D630 Anemia in neoplastic disease: Secondary | ICD-10-CM | POA: Insufficient documentation

## 2019-07-16 DIAGNOSIS — I10 Essential (primary) hypertension: Secondary | ICD-10-CM

## 2019-07-16 DIAGNOSIS — K56609 Unspecified intestinal obstruction, unspecified as to partial versus complete obstruction: Secondary | ICD-10-CM | POA: Diagnosis not present

## 2019-07-16 DIAGNOSIS — Z452 Encounter for adjustment and management of vascular access device: Secondary | ICD-10-CM | POA: Insufficient documentation

## 2019-07-16 DIAGNOSIS — C773 Secondary and unspecified malignant neoplasm of axilla and upper limb lymph nodes: Secondary | ICD-10-CM | POA: Insufficient documentation

## 2019-07-16 DIAGNOSIS — D509 Iron deficiency anemia, unspecified: Secondary | ICD-10-CM | POA: Insufficient documentation

## 2019-07-16 DIAGNOSIS — C184 Malignant neoplasm of transverse colon: Secondary | ICD-10-CM | POA: Insufficient documentation

## 2019-07-16 DIAGNOSIS — C189 Malignant neoplasm of colon, unspecified: Secondary | ICD-10-CM

## 2019-07-16 DIAGNOSIS — Z5111 Encounter for antineoplastic chemotherapy: Secondary | ICD-10-CM | POA: Insufficient documentation

## 2019-07-16 DIAGNOSIS — Z7189 Other specified counseling: Secondary | ICD-10-CM

## 2019-07-16 DIAGNOSIS — Z5112 Encounter for antineoplastic immunotherapy: Secondary | ICD-10-CM | POA: Diagnosis not present

## 2019-07-16 DIAGNOSIS — Z95828 Presence of other vascular implants and grafts: Secondary | ICD-10-CM

## 2019-07-16 DIAGNOSIS — Z933 Colostomy status: Secondary | ICD-10-CM | POA: Diagnosis not present

## 2019-07-16 LAB — CBC WITH DIFFERENTIAL (CANCER CENTER ONLY)
Abs Immature Granulocytes: 0.01 10*3/uL (ref 0.00–0.07)
Basophils Absolute: 0.1 10*3/uL (ref 0.0–0.1)
Basophils Relative: 1 %
Eosinophils Absolute: 0.1 10*3/uL (ref 0.0–0.5)
Eosinophils Relative: 2 %
HCT: 33.4 % — ABNORMAL LOW (ref 36.0–46.0)
Hemoglobin: 10.6 g/dL — ABNORMAL LOW (ref 12.0–15.0)
Immature Granulocytes: 0 %
Lymphocytes Relative: 36 %
Lymphs Abs: 1.7 10*3/uL (ref 0.7–4.0)
MCH: 29.5 pg (ref 26.0–34.0)
MCHC: 31.7 g/dL (ref 30.0–36.0)
MCV: 93 fL (ref 80.0–100.0)
Monocytes Absolute: 0.5 10*3/uL (ref 0.1–1.0)
Monocytes Relative: 10 %
Neutro Abs: 2.3 10*3/uL (ref 1.7–7.7)
Neutrophils Relative %: 51 %
Platelet Count: 142 10*3/uL — ABNORMAL LOW (ref 150–400)
RBC: 3.59 MIL/uL — ABNORMAL LOW (ref 3.87–5.11)
RDW: 18.2 % — ABNORMAL HIGH (ref 11.5–15.5)
WBC Count: 4.6 10*3/uL (ref 4.0–10.5)
nRBC: 0 % (ref 0.0–0.2)

## 2019-07-16 LAB — FERRITIN: Ferritin: 117 ng/mL (ref 11–307)

## 2019-07-16 LAB — CMP (CANCER CENTER ONLY)
ALT: 18 U/L (ref 0–44)
AST: 22 U/L (ref 15–41)
Albumin: 3.4 g/dL — ABNORMAL LOW (ref 3.5–5.0)
Alkaline Phosphatase: 82 U/L (ref 38–126)
Anion gap: 6 (ref 5–15)
BUN: 17 mg/dL (ref 6–20)
CO2: 23 mmol/L (ref 22–32)
Calcium: 9.5 mg/dL (ref 8.9–10.3)
Chloride: 111 mmol/L (ref 98–111)
Creatinine: 0.88 mg/dL (ref 0.44–1.00)
GFR, Est AFR Am: >60 mL/min (ref >60)
GFR, Estimated: >60 mL/min (ref >60)
Glucose, Bld: 76 mg/dL (ref 70–99)
Potassium: 4.1 mmol/L (ref 3.5–5.1)
Sodium: 140 mmol/L (ref 135–145)
Total Bilirubin: <0.2 mg/dL — ABNORMAL LOW (ref 0.3–1.2)
Total Protein: 7 g/dL (ref 6.5–8.1)

## 2019-07-16 LAB — IRON AND TIBC
Iron: 120 ug/dL (ref 41–142)
Saturation Ratios: 45 % (ref 21–57)
TIBC: 265 ug/dL (ref 236–444)
UIBC: 145 ug/dL (ref 120–384)

## 2019-07-16 LAB — CEA (IN HOUSE-CHCC): CEA (CHCC-In House): 58.14 ng/mL — ABNORMAL HIGH (ref 0.00–5.00)

## 2019-07-16 LAB — TOTAL PROTEIN, URINE DIPSTICK: Protein, ur: <30 mg/dL — AB

## 2019-07-16 MED ORDER — SODIUM CHLORIDE 0.9 % IV SOLN
5.0000 mg/kg | Freq: Once | INTRAVENOUS | Status: AC
  Administered 2019-07-16: 12:00:00 400 mg via INTRAVENOUS
  Filled 2019-07-16: qty 16

## 2019-07-16 MED ORDER — OXALIPLATIN CHEMO INJECTION 100 MG/20ML
85.0000 mg/m2 | Freq: Once | INTRAVENOUS | Status: AC
  Administered 2019-07-16: 170 mg via INTRAVENOUS
  Filled 2019-07-16: qty 34

## 2019-07-16 MED ORDER — CLONIDINE HCL 0.1 MG PO TABS
0.1000 mg | ORAL_TABLET | Freq: Once | ORAL | Status: AC
  Administered 2019-07-16: 0.1 mg via ORAL

## 2019-07-16 MED ORDER — CLONIDINE HCL 0.1 MG PO TABS
ORAL_TABLET | ORAL | Status: AC
  Filled 2019-07-16: qty 1

## 2019-07-16 MED ORDER — SODIUM CHLORIDE 0.9 % IV SOLN
2470.0000 mg/m2 | INTRAVENOUS | Status: DC
  Administered 2019-07-16: 5000 mg via INTRAVENOUS
  Filled 2019-07-16: qty 100

## 2019-07-16 MED ORDER — SODIUM CHLORIDE 0.9 % IV SOLN
10.0000 mg | Freq: Once | INTRAVENOUS | Status: AC
  Administered 2019-07-16: 10 mg via INTRAVENOUS
  Filled 2019-07-16: qty 10

## 2019-07-16 MED ORDER — DEXTROSE 5 % IV SOLN
Freq: Once | INTRAVENOUS | Status: AC
  Filled 2019-07-16: qty 250

## 2019-07-16 MED ORDER — AMLODIPINE BESYLATE 10 MG PO TABS: 10.0000 mg | ORAL_TABLET | Freq: Every day | ORAL | 1 refills | Status: DC

## 2019-07-16 MED ORDER — LEUCOVORIN CALCIUM INJECTION 350 MG
400.0000 mg/m2 | Freq: Once | INTRAVENOUS | Status: AC
  Administered 2019-07-16: 808 mg via INTRAVENOUS
  Filled 2019-07-16: qty 40.4

## 2019-07-16 MED ORDER — SODIUM CHLORIDE 0.9% FLUSH
10.0000 mL | INTRAVENOUS | Status: DC | PRN
  Administered 2019-07-16: 08:00:00 10 mL
  Filled 2019-07-16: qty 10

## 2019-07-16 MED ORDER — SODIUM CHLORIDE 0.9 % IV SOLN
INTRAVENOUS | Status: DC
  Filled 2019-07-16: qty 250

## 2019-07-16 MED ORDER — PALONOSETRON HCL INJECTION 0.25 MG/5ML
INTRAVENOUS | Status: AC
  Filled 2019-07-16: qty 5

## 2019-07-16 MED ORDER — PALONOSETRON HCL INJECTION 0.25 MG/5ML
0.2500 mg | Freq: Once | INTRAVENOUS | Status: AC
  Administered 2019-07-16: 0.25 mg via INTRAVENOUS

## 2019-07-16 MED ORDER — DEXTROSE 5 % IV SOLN
Freq: Once | INTRAVENOUS | Status: DC
  Filled 2019-07-16: qty 250

## 2019-07-16 MED ORDER — SODIUM CHLORIDE 0.9% FLUSH
10.0000 mL | INTRAVENOUS | Status: DC | PRN
  Administered 2019-07-16: 10 mL
  Filled 2019-07-16: qty 10

## 2019-07-16 NOTE — Progress Notes (Signed)
Per Dr. Burr Medico ok to proceed with avastin with BP of 187/68.

## 2019-07-16 NOTE — Patient Instructions (Signed)

## 2019-07-16 NOTE — Telephone Encounter (Signed)
Scheduled appt per 4/12 los.  

## 2019-07-16 NOTE — Patient Instructions (Signed)
[facility] Discharge Instructions for Patients Receiving Chemotherapy  Today you received the following chemotherapy agents: oxaliplatin, leucovorin, fluorouracil, and bevacizumab.  To help prevent nausea and vomiting after your treatment, we encourage you to take your nausea medication as directed.   If you develop nausea and vomiting that is not controlled by your nausea medication, call the clinic.   BELOW ARE SYMPTOMS THAT SHOULD BE REPORTED IMMEDIATELY:  *FEVER GREATER THAN 100.5 F  *CHILLS WITH OR WITHOUT FEVER  NAUSEA AND VOMITING THAT IS NOT CONTROLLED WITH YOUR NAUSEA MEDICATION  *UNUSUAL SHORTNESS OF BREATH  *UNUSUAL BRUISING OR BLEEDING  TENDERNESS IN MOUTH AND THROAT WITH OR WITHOUT PRESENCE OF ULCERS  *URINARY PROBLEMS  *BOWEL PROBLEMS  UNUSUAL RASH Items with * indicate a potential emergency and should be followed up as soon as possible.  Feel free to call the clinic should you have any questions or concerns. The clinic phone number is [phone].  Please show the [place] at check-in to the Emergency Department and triage nurse.

## 2019-07-18 ENCOUNTER — Other Ambulatory Visit: Payer: Self-pay

## 2019-07-18 ENCOUNTER — Inpatient Hospital Stay: Payer: BC Managed Care – PPO

## 2019-07-18 VITALS — BP 172/70 | HR 72 | Temp 98.2°F | Resp 18

## 2019-07-18 DIAGNOSIS — C787 Secondary malignant neoplasm of liver and intrahepatic bile duct: Secondary | ICD-10-CM | POA: Diagnosis not present

## 2019-07-18 DIAGNOSIS — D630 Anemia in neoplastic disease: Secondary | ICD-10-CM | POA: Diagnosis not present

## 2019-07-18 DIAGNOSIS — Z452 Encounter for adjustment and management of vascular access device: Secondary | ICD-10-CM | POA: Diagnosis not present

## 2019-07-18 DIAGNOSIS — Z5112 Encounter for antineoplastic immunotherapy: Secondary | ICD-10-CM | POA: Diagnosis not present

## 2019-07-18 DIAGNOSIS — D509 Iron deficiency anemia, unspecified: Secondary | ICD-10-CM | POA: Diagnosis not present

## 2019-07-18 DIAGNOSIS — C189 Malignant neoplasm of colon, unspecified: Secondary | ICD-10-CM

## 2019-07-18 DIAGNOSIS — Z5111 Encounter for antineoplastic chemotherapy: Secondary | ICD-10-CM | POA: Diagnosis not present

## 2019-07-18 DIAGNOSIS — C184 Malignant neoplasm of transverse colon: Secondary | ICD-10-CM | POA: Diagnosis not present

## 2019-07-18 DIAGNOSIS — C773 Secondary and unspecified malignant neoplasm of axilla and upper limb lymph nodes: Secondary | ICD-10-CM | POA: Diagnosis not present

## 2019-07-18 DIAGNOSIS — Z7189 Other specified counseling: Secondary | ICD-10-CM

## 2019-07-18 MED ORDER — SODIUM CHLORIDE 0.9% FLUSH
10.0000 mL | INTRAVENOUS | Status: DC | PRN
  Administered 2019-07-18: 10 mL
  Filled 2019-07-18: qty 10

## 2019-07-18 MED ORDER — HEPARIN SOD (PORK) LOCK FLUSH 100 UNIT/ML IV SOLN
500.0000 [IU] | Freq: Once | INTRAVENOUS | Status: AC | PRN
  Administered 2019-07-18: 500 [IU]
  Filled 2019-07-18: qty 5

## 2019-07-19 ENCOUNTER — Other Ambulatory Visit: Payer: Self-pay

## 2019-07-20 ENCOUNTER — Other Ambulatory Visit (HOSPITAL_COMMUNITY)
Admission: RE | Admit: 2019-07-20 | Discharge: 2019-07-20 | Disposition: A | Discharge status: Home / Self Care | Payer: BC Managed Care – PPO | Source: Ambulatory Visit | Attending: Internal Medicine | Admitting: Internal Medicine

## 2019-07-20 ENCOUNTER — Ambulatory Visit (INDEPENDENT_AMBULATORY_CARE_PROVIDER_SITE_OTHER): Payer: BC Managed Care – PPO | Admitting: Internal Medicine

## 2019-07-20 ENCOUNTER — Encounter: Payer: Self-pay | Admitting: Internal Medicine

## 2019-07-20 VITALS — BP 180/90 | HR 55 | Temp 98.5°F | Wt 187.7 lb

## 2019-07-20 DIAGNOSIS — Z1239 Encounter for other screening for malignant neoplasm of breast: Secondary | ICD-10-CM | POA: Diagnosis not present

## 2019-07-20 DIAGNOSIS — R87610 Atypical squamous cells of undetermined significance on cytologic smear of cervix (ASC-US): Secondary | ICD-10-CM | POA: Diagnosis not present

## 2019-07-20 DIAGNOSIS — C189 Malignant neoplasm of colon, unspecified: Secondary | ICD-10-CM

## 2019-07-20 DIAGNOSIS — I10 Essential (primary) hypertension: Secondary | ICD-10-CM

## 2019-07-20 DIAGNOSIS — Z124 Encounter for screening for malignant neoplasm of cervix: Secondary | ICD-10-CM

## 2019-07-20 DIAGNOSIS — Z0000 Encounter for general adult medical examination without abnormal findings: Secondary | ICD-10-CM | POA: Diagnosis not present

## 2019-07-20 DIAGNOSIS — C787 Secondary malignant neoplasm of liver and intrahepatic bile duct: Secondary | ICD-10-CM

## 2019-07-20 NOTE — Patient Instructions (Addendum)
-Nice seeing you today!!  -Lab work today; will notify you once results are available.  -Mammogram requested today.  -Schedule follow up in 6 weeks.   Preventive Care 29-60 Years Old, Female Preventive care refers to visits with your health care provider and lifestyle choices that can promote health and wellness. This includes:  A yearly physical exam. This may also be called an annual well check.  Regular dental visits and eye exams.  Immunizations.  Screening for certain conditions.  Healthy lifestyle choices, such as eating a healthy diet, getting regular exercise, not using drugs or products that contain nicotine and tobacco, and limiting alcohol use. What can I expect for my preventive care visit? Physical exam Your health care provider will check your:  Height and weight. This may be used to calculate body mass index (BMI), which tells if you are at a healthy weight.  Heart rate and blood pressure.  Skin for abnormal spots. Counseling Your health care provider may ask you questions about your:  Alcohol, tobacco, and drug use.  Emotional well-being.  Home and relationship well-being.  Sexual activity.  Eating habits.  Work and work Statistician.  Method of birth control.  Menstrual cycle.  Pregnancy history. What immunizations do I need?  Influenza (flu) vaccine  This is recommended every year. Tetanus, diphtheria, and pertussis (Tdap) vaccine  You may need a Td booster every 10 years. Varicella (chickenpox) vaccine  You may need this if you have not been vaccinated. Zoster (shingles) vaccine  You may need this after age 60. Measles, mumps, and rubella (MMR) vaccine  You may need at least one dose of MMR if you were born in 1957 or later. You may also need a second dose. Pneumococcal conjugate (PCV13) vaccine  You may need this if you have certain conditions and were not previously vaccinated. Pneumococcal polysaccharide (PPSV23) vaccine   You may need one or two doses if you smoke cigarettes or if you have certain conditions. Meningococcal conjugate (MenACWY) vaccine  You may need this if you have certain conditions. Hepatitis A vaccine  You may need this if you have certain conditions or if you travel or work in places where you may be exposed to hepatitis A. Hepatitis B vaccine  You may need this if you have certain conditions or if you travel or work in places where you may be exposed to hepatitis B. Haemophilus influenzae type b (Hib) vaccine  You may need this if you have certain conditions. Human papillomavirus (HPV) vaccine  If recommended by your health care provider, you may need three doses over 6 months. You may receive vaccines as individual doses or as more than one vaccine together in one shot (combination vaccines). Talk with your health care provider about the risks and benefits of combination vaccines. What tests do I need? Blood tests  Lipid and cholesterol levels. These may be checked every 5 years, or more frequently if you are over 57 years old.  Hepatitis C test.  Hepatitis B test. Screening  Lung cancer screening. You may have this screening every year starting at age 62 if you have a 30-pack-year history of smoking and currently smoke or have quit within the past 15 years.  Colorectal cancer screening. All adults should have this screening starting at age 68 and continuing until age 53. Your health care provider may recommend screening at age 75 if you are at increased risk. You will have tests every 1-10 years, depending on your results and the type  of screening test.  Diabetes screening. This is done by checking your blood sugar (glucose) after you have not eaten for a while (fasting). You may have this done every 1-3 years.  Mammogram. This may be done every 1-2 years. Talk with your health care provider about when you should start having regular mammograms. This may depend on whether you  have a family history of breast cancer.  BRCA-related cancer screening. This may be done if you have a family history of breast, ovarian, tubal, or peritoneal cancers.  Pelvic exam and Pap test. This may be done every 3 years starting at age 49. Starting at age 82, this may be done every 5 years if you have a Pap test in combination with an HPV test. Other tests  Sexually transmitted disease (STD) testing.  Bone density scan. This is done to screen for osteoporosis. You may have this scan if you are at high risk for osteoporosis. Follow these instructions at home: Eating and drinking  Eat a diet that includes fresh fruits and vegetables, whole grains, lean protein, and low-fat dairy.  Take vitamin and mineral supplements as recommended by your health care provider.  Do not drink alcohol if: ? Your health care provider tells you not to drink. ? You are pregnant, may be pregnant, or are planning to become pregnant.  If you drink alcohol: ? Limit how much you have to 0-1 drink a day. ? Be aware of how much alcohol is in your drink. In the [place] one drink equals one 12 oz bottle of beer (355 mL), one 5 oz glass of wine (148 mL), or one 1 oz glass of hard liquor (44 mL). Lifestyle  Take daily care of your teeth and gums.  Stay active. Exercise for at least 30 minutes on 5 or more days each week.  Do not use any products that contain nicotine or tobacco, such as cigarettes, e-cigarettes, and chewing tobacco. If you need help quitting, ask your health care provider.  If you are sexually active, practice safe sex. Use a condom or other form of birth control (contraception) in order to prevent pregnancy and STIs (sexually transmitted infections).  If told by your health care provider, take low-dose aspirin daily starting at age 69. What's next?  Visit your health care provider once a year for a well check visit.  Ask your health care provider how often you should have your eyes and  teeth checked.  Stay up to date on all vaccines. This information is not intended to replace advice given to you by your health care provider. Make sure you discuss any questions you have with your health care provider. Document Revised: 12/01/2017 Document Reviewed: 12/01/2017 Elsevier Patient Education  2020 [employer].

## 2019-07-20 NOTE — Progress Notes (Signed)
Established Patient Office Visit     This visit occurred during the SARS-CoV-2 public health emergency.  Safety protocols were in place, including screening questions prior to the visit, additional usage of staff PPE, and extensive cleaning of exam room while observing appropriate contact time as indicated for disinfecting solutions.    CC/Reason for Visit: Annual Preventive exam  HPI: Casey Black is a 59 y.o. female who is coming in today for the above mentioned reasons. 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  She had colectomy and colostomy that admission.   Her surgeon is Dr. Kae Heller.  She also has a Port-A-Cath.  She is also seeing Dr. Annamaria Boots with oncology and has started her FOLFOX regimen. 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  Other than that she has a history of hypertension that had been well controlled on a multidrug regimen including hydralazine 25 mg 3 times a day, isosorbide mononitrate 30 mg daily, lisinopril 40 mg daily, metoprolol 50 mg twice daily. BP has been high as of late, was just started on norvasc 10 this week, has not picked up Rx yet. She used to be a heavy smoker of over 22 years but quit after her diagnosis of colon cancer. Has not had eye and dental follow up. Scheduled for first COVID vaccine tomorrow.    Past Medical/Surgical History: Past Medical History:  Diagnosis Date  . Anemia    low iron  . Cancer [facility])    colon cancer  . Hypertension 01/23/2019  . SBO (small bowel obstruction) ([place]) 01/23/2019    Past Surgical History:  Procedure Laterality Date  . CESAREAN SECTION     x2  . COLOSTOMY N/A 01/24/2019   Procedure: End Loop Colostomy;  Surgeon: Clovis Riley, MD;  Location: [place];  Service: General;  Laterality: N/A;  . PARTIAL  COLECTOMY N/A 01/24/2019   Procedure: PARTIAL COLECTOMY;  Surgeon: Clovis Riley, MD;  Location: [place];  Service: General;  Laterality: N/A;  . PORTACATH PLACEMENT Right 02/28/2019   Procedure: INSERTION PORT-A-CATH WITH ULTRASOUND GUIDANCE;  Surgeon: Clovis Riley, MD;  Location: [place];  Service: General;  Laterality: Right;    Social History:  reports that she quit smoking about 6 months ago. Her smoking use included cigarettes. She has a 14.50 pack-year smoking history. She has never used smokeless tobacco. She reports previous alcohol use. She reports that she does not use drugs.  Allergies: No Known Allergies  Family History:  Family History  Problem Relation Age of Onset  . Kidney failure Father   . Hypertension Father   . Hypertension Sister      Current Outpatient Medications:  .  acetaminophen (TYLENOL) 325 MG tablet, Take 2 tablets (650 mg total) by mouth every 6 (six) hours as needed., Disp: , Rfl:  .  amLODipine (NORVASC) 10 MG tablet, Take 1 tablet (10 mg total) by mouth daily., Disp: 30 tablet, Rfl: 1 .  ferrous sulfate 325 (65 FE) MG tablet, TAKE 1 TABLET (325 MG TOTAL) BY MOUTH 2 (TWO) TIMES DAILY WITH A MEAL., Disp: 180 tablet, Rfl: 1 .  hydrALAZINE (APRESOLINE) 25 MG tablet, TAKE 1 TABLET (25 MG TOTAL) BY MOUTH EVERY 8 (EIGHT) HOURS., Disp: 90 tablet, Rfl: 1 .  Ibuprofen (ADVIL) 200 MG CAPS, Take 400 mg by  mouth daily as needed (pain)., Disp: , Rfl:  .  isosorbide mononitrate (IMDUR) 30 MG 24 hr tablet, TAKE 1 TABLET BY MOUTH EVERY DAY, Disp: 90 tablet, Rfl: 0 .  lidocaine-prilocaine (EMLA) cream, Apply to affected area once (Patient taking differently: Apply 1 application topically once. Apply to affected area once), Disp: 30 g, Rfl: 3 .  lisinopril (ZESTRIL) 40 MG tablet, TAKE 1 TABLET BY MOUTH EVERY DAY, Disp: 90 tablet, Rfl: 1 .  loperamide (IMODIUM) 2 MG capsule, TAKE 1 CAPSULE (2 MG TOTAL) BY MOUTH AS NEEDED FOR DIARRHEA OR LOOSE STOOLS., Disp: 90  capsule, Rfl: 0 .  metoprolol tartrate (LOPRESSOR) 50 MG tablet, TAKE 1 TABLET BY MOUTH TWICE A DAY, Disp: 180 tablet, Rfl: 1 .  ondansetron (ZOFRAN) 4 MG tablet, Take 1 tablet (4 mg total) by mouth every 6 (six) hours as needed for nausea., Disp: 30 tablet, Rfl: 0 .  ondansetron (ZOFRAN) 8 MG tablet, Take 1 tablet (8 mg total) by mouth 2 (two) times daily as needed for refractory nausea / vomiting. Start on day 3 after chemotherapy., Disp: 30 tablet, Rfl: 1 .  polycarbophil (FIBERCON) 625 MG tablet, Take 1 tablet (625 mg total) by mouth daily., Disp: 30 tablet, Rfl: 0 .  prochlorperazine (COMPAZINE) 10 MG tablet, Take 1 tablet (10 mg total) by mouth every 6 (six) hours as needed (Nausea or vomiting)., Disp: 30 tablet, Rfl: 1  Review of Systems:  Constitutional: Denies fever, chills, diaphoresis, appetite change and fatigue.  HEENT: Denies photophobia, eye pain, redness, hearing loss, ear pain, congestion, sore throat, rhinorrhea, sneezing, mouth sores, trouble swallowing, neck pain, neck stiffness and tinnitus.   Respiratory: Denies SOB, DOE, cough, chest tightness,  and wheezing.   Cardiovascular: Denies chest pain, palpitations and leg swelling.  Gastrointestinal: Denies nausea, vomiting, abdominal pain, diarrhea, constipation, blood in stool and abdominal distention.  Genitourinary: Denies dysuria, urgency, frequency, hematuria, flank pain and difficulty urinating.  Endocrine: Denies: hot or cold intolerance, sweats, changes in hair or nails, polyuria, polydipsia. Musculoskeletal: Denies myalgias, back pain, joint swelling, arthralgias and gait problem.  Skin: Denies pallor, rash and wound.  Neurological: Denies dizziness, seizures, syncope, weakness, light-headedness, numbness and headaches.  Hematological: Denies adenopathy. Easy bruising, personal or family bleeding history  Psychiatric/Behavioral: Denies suicidal ideation, mood changes, confusion, nervousness, sleep disturbance and  agitation    Physical Exam: Vitals:   07/20/19 0757  BP: (!) 180/90  Pulse: (!) 55  Temp: 98.5 F (36.9 C)  TempSrc: Temporal  SpO2: 99%  Weight: 187 lb 11.2 oz (85.1 kg)    Body mass index is 29.4 kg/m.   Constitutional: NAD, calm, comfortable Eyes: PERRL, lids and conjunctivae normal ENMT: Mucous membranes are moist. Tympanic membrane is pearly white, no erythema or bulging. Neck: normal, supple, no masses, no thyromegaly Respiratory: clear to auscultation bilaterally, no wheezing, no crackles. Normal respiratory effort. No accessory muscle use.  Cardiovascular: Regular rate and rhythm, no murmurs / rubs / gallops. No extremity edema. Abdomen: no tenderness, no masses palpated. No hepatosplenomegaly. Bowel sounds positive.  Musculoskeletal: no clubbing / cyanosis. No joint deformity upper and lower extremities. Good ROM, no contractures. Normal muscle tone.  Skin: no rashes, lesions, ulcers. No induration, discoloration of fingertips. Neurologic: CN 2-12 grossly intact. Sensation intact, DTR normal. Strength 5/5 in all 4.  Psychiatric: Normal judgment and insight. Alert and oriented x 3. Normal mood.    Impression and Plan:  Screening breast examination  - Plan: MM Digital Screening  Screening for malignant  neoplasm of cervix -pap smear in office today; not sure if a good sample was obtained as patient was very tense and did not allow for adequate speculum insertion.  Encounter for preventive health examination -Advised routine eye and dental care. -Going for COVID vaccine tomorrow. Also due for tdap and shingles series. -Screening labs today. -healthy lifestyle discussed in detail. -Undergoing Rx for colon cancer. -Mammogram requested. -Pap smear done today.  Essential hypertension -uncontrolled. -NORVASC 10 mg added by onc earlier this week that she has not started as of yet. -Se is also on lisinopril 40, imdur 30, metoprolol 50 BID and hydralazine 25  TID. -She will f/u with me in 6 weeks for BP check.  Adenocarcinoma of colon metastatic to liver ([place]) -Followed by onc. -Currently undergoing chemo.    Patient Instructions  -Nice seeing you today!!  -Lab work today; will notify you once results are available.  -Mammogram requested today.  -Schedule follow up in 6 months.   Preventive Care 45-15 Years Old, Female Preventive care refers to visits with your health care provider and lifestyle choices that can promote health and wellness. This includes:  A yearly physical exam. This may also be called an annual well check.  Regular dental visits and eye exams.  Immunizations.  Screening for certain conditions.  Healthy lifestyle choices, such as eating a healthy diet, getting regular exercise, not using drugs or products that contain nicotine and tobacco, and limiting alcohol use. What can I expect for my preventive care visit? Physical exam Your health care provider will check your:  Height and weight. This may be used to calculate body mass index (BMI), which tells if you are at a healthy weight.  Heart rate and blood pressure.  Skin for abnormal spots. Counseling Your health care provider may ask you questions about your:  Alcohol, tobacco, and drug use.  Emotional well-being.  Home and relationship well-being.  Sexual activity.  Eating habits.  Work and work Statistician.  Method of birth control.  Menstrual cycle.  Pregnancy history. What immunizations do I need?  Influenza (flu) vaccine  This is recommended every year. Tetanus, diphtheria, and pertussis (Tdap) vaccine  You may need a Td booster every 10 years. Varicella (chickenpox) vaccine  You may need this if you have not been vaccinated. Zoster (shingles) vaccine  You may need this after age 43. Measles, mumps, and rubella (MMR) vaccine  You may need at least one dose of MMR if you were born in 1957 or later. You may also need a second  dose. Pneumococcal conjugate (PCV13) vaccine  You may need this if you have certain conditions and were not previously vaccinated. Pneumococcal polysaccharide (PPSV23) vaccine  You may need one or two doses if you smoke cigarettes or if you have certain conditions. Meningococcal conjugate (MenACWY) vaccine  You may need this if you have certain conditions. Hepatitis A vaccine  You may need this if you have certain conditions or if you travel or work in places where you may be exposed to hepatitis A. Hepatitis B vaccine  You may need this if you have certain conditions or if you travel or work in places where you may be exposed to hepatitis B. Haemophilus influenzae type b (Hib) vaccine  You may need this if you have certain conditions. Human papillomavirus (HPV) vaccine  If recommended by your health care provider, you may need three doses over 6 months. You may receive vaccines as individual doses or as more than one vaccine together  in one shot (combination vaccines). Talk with your health care provider about the risks and benefits of combination vaccines. What tests do I need? Blood tests  Lipid and cholesterol levels. These may be checked every 5 years, or more frequently if you are over 76 years old.  Hepatitis C test.  Hepatitis B test. Screening  Lung cancer screening. You may have this screening every year starting at age 32 if you have a 30-pack-year history of smoking and currently smoke or have quit within the past 15 years.  Colorectal cancer screening. All adults should have this screening starting at age 28 and continuing until age 21. Your health care provider may recommend screening at age 54 if you are at increased risk. You will have tests every 1-10 years, depending on your results and the type of screening test.  Diabetes screening. This is done by checking your blood sugar (glucose) after you have not eaten for a while (fasting). You may have this done every  1-3 years.  Mammogram. This may be done every 1-2 years. Talk with your health care provider about when you should start having regular mammograms. This may depend on whether you have a family history of breast cancer.  BRCA-related cancer screening. This may be done if you have a family history of breast, ovarian, tubal, or peritoneal cancers.  Pelvic exam and Pap test. This may be done every 3 years starting at age 14. Starting at age 50, this may be done every 5 years if you have a Pap test in combination with an HPV test. Other tests  Sexually transmitted disease (STD) testing.  Bone density scan. This is done to screen for osteoporosis. You may have this scan if you are at high risk for osteoporosis. Follow these instructions at home: Eating and drinking  Eat a diet that includes fresh fruits and vegetables, whole grains, lean protein, and low-fat dairy.  Take vitamin and mineral supplements as recommended by your health care provider.  Do not drink alcohol if: ? Your health care provider tells you not to drink. ? You are pregnant, may be pregnant, or are planning to become pregnant.  If you drink alcohol: ? Limit how much you have to 0-1 drink a day. ? Be aware of how much alcohol is in your drink. In the [place] one drink equals one 12 oz bottle of beer (355 mL), one 5 oz glass of wine (148 mL), or one 1 oz glass of hard liquor (44 mL). Lifestyle  Take daily care of your teeth and gums.  Stay active. Exercise for at least 30 minutes on 5 or more days each week.  Do not use any products that contain nicotine or tobacco, such as cigarettes, e-cigarettes, and chewing tobacco. If you need help quitting, ask your health care provider.  If you are sexually active, practice safe sex. Use a condom or other form of birth control (contraception) in order to prevent pregnancy and STIs (sexually transmitted infections).  If told by your health care provider, take low-dose aspirin daily  starting at age 30. What's next?  Visit your health care provider once a year for a well check visit.  Ask your health care provider how often you should have your eyes and teeth checked.  Stay up to date on all vaccines. This information is not intended to replace advice given to you by your health care provider. Make sure you discuss any questions you have with your health care provider. Document Revised: 12/01/2017  Document Reviewed: 12/01/2017 Elsevier Patient Education  2020 [place], MD [facility]

## 2019-07-20 NOTE — Addendum Note (Signed)
Addended by: Westley Hummer B on: 07/20/2019 08:57 AM   Modules accepted: Orders

## 2019-07-21 ENCOUNTER — Ambulatory Visit: Payer: BC Managed Care – PPO | Attending: Internal Medicine

## 2019-07-21 DIAGNOSIS — Z23 Encounter for immunization: Secondary | ICD-10-CM

## 2019-07-21 NOTE — Progress Notes (Signed)
   Covid-19 Vaccination Clinic  Name:  Ofilia Wolfinger    MRN: [id number] DOB: 1960/10/07  07/21/2019  Ms. Kovalsky was observed post Covid-19 immunization for 15 minutes without incident. She was provided with Vaccine Information Sheet and instruction to access the V-Safe system.   Ms. Deats was instructed to call 911 with any severe reactions post vaccine: Marland Kitchen Difficulty breathing  . Swelling of face and throat  . A fast heartbeat  . A bad rash all over body  . Dizziness and weakness   Immunizations Administered    Name Date Dose VIS Date Route   Pfizer COVID-19 Vaccine 07/21/2019 11:25 AM 0.3 mL 03/16/2019 Intramuscular   Manufacturer: [place]   Lot: [id number]   [place]: [id number]

## 2019-07-24 ENCOUNTER — Other Ambulatory Visit: Payer: Self-pay | Admitting: Internal Medicine

## 2019-07-24 ENCOUNTER — Encounter: Payer: Self-pay | Admitting: Internal Medicine

## 2019-07-24 DIAGNOSIS — A599 Trichomoniasis, unspecified: Secondary | ICD-10-CM

## 2019-07-24 DIAGNOSIS — R87610 Atypical squamous cells of undetermined significance on cytologic smear of cervix (ASC-US): Secondary | ICD-10-CM | POA: Insufficient documentation

## 2019-07-24 LAB — CYTOLOGY - PAP
Comment: NEGATIVE
Diagnosis: UNDETERMINED — AB
High risk HPV: NEGATIVE

## 2019-07-24 MED ORDER — METRONIDAZOLE 500 MG PO TABS: 2000.0000 mg | ORAL_TABLET | Freq: Once | ORAL | 0 refills | Status: AC

## 2019-07-25 ENCOUNTER — Other Ambulatory Visit: Payer: Self-pay | Admitting: Internal Medicine

## 2019-07-25 MED ORDER — METRONIDAZOLE 250 MG PO TABS: 250.0000 mg | ORAL_TABLET | Freq: Three times a day (TID) | ORAL | 0 refills | Status: DC

## 2019-07-25 NOTE — Progress Notes (Signed)
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 Bowel-gas pattern consistent with small bowel obstruction. No free air. 2. No acute chest findings.   01/23/2019 Imaging   CT AP IMPRESSION: Obstructing mid transverse colonic mass with mild regional adenopathy and hepatic metastatic disease. The mass likely extends through the serosa; no ascites or peritoneal nodularity.   01/24/2019 Surgery   Surgeon: Clovis Riley MD Assistant: [name] Procedure performed: Transverse colectomy with end colostomy, liver biopsy Procedure classification: URGENT/EMERGENT Preop diagnosis: Obstructing, metastatic transverse colon mass Post-op diagnosis/intraop findings: Same   01/24/2019 Pathology Results   FINAL MICROSCOPIC DIAGNOSIS:   A. COLON, TRANSVERSE, RESECTION:  Colonic adenocarcinoma, 5 cm.  Carcinoma extends into pericolonic connective tissue and focally to  serosal surface.  Margins not involved.  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   B. LIVER NODULE, LEFT, BIOPSY:  Metastatic adenocarcinoma.    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 Hypermetabolic metastatic disease in the liver and mediastinal/hilar/axillary lymph nodes. 2. Focal hypermetabolism in the rectum. Continued attention on follow-up exams is warranted. 3. Focal hypermetabolism medial to the right adrenal gland may be within a metastatic lymph node, better visualized on 01/23/2019. 4. Aortic atherosclerosis (ICD10-170.0). Coronary artery calcification.   03/12/2019 -  Chemotherapy   She started 5FU q2weeks on 03/12/19 for 2 cycles. She started full dose FOLFOX with Avastin on 04/09/19.   05/31/2019 Imaging   Restaging CT CAP IMPRESSION: 1. Similar to mild interval decrease in size of multiple hepatic lesions, partially calcified. 2. 2 mm right upper lobe pulmonary nodule. Recommend attention on follow-up. 3. Emphysema and aortic atherosclerosis.      CURRENT THERAPY:  She started 5FU q2weeks on 03/12/19 for 2 cycles. She started full dose FOLFOX with Avastin on 04/09/19.   INTERVAL HISTORY:  Casey Black is here for a follow up and treatment. He presents to the clinic alone. She notes she is doing well. She notes she had mammogram last week and will return next week for diagnostic mammogram. She denies h/o breast biopsy before. She notes she has been managing chemo well with more skin darkening. She denies neuropathy in hands or feet, denies nausea or diarrhea. She feels she is eating adequately and will walk 2 miles every morning. She denies HA or chest pain from her elevated HTN. She is on amlodipine and hydralazine and lisinopril and metoprolol. She saw her new PCP on 4/16.  REVIEW OF SYSTEMS:   Constitutional: Denies fevers, chills or abnormal weight loss Eyes: Denies blurriness of vision Ears, nose,  mouth, throat, and face: Denies mucositis or sore throat Respiratory: Denies cough, dyspnea or wheezes Cardiovascular: Denies palpitation, chest discomfort or lower extremity swelling Gastrointestinal:  Denies nausea, heartburn or change in bowel habits Skin: Denies abnormal skin rashes (+) Skin darkening of hands  Lymphatics: Denies new lymphadenopathy or easy bruising Neurological:Denies numbness, tingling or new weaknesses Behavioral/Psych: Mood is stable, no new changes  All other systems were reviewed with the patient and are negative.  MEDICAL HISTORY:  Past Medical History:  Diagnosis Date  . Anemia    low iron  . Cancer [facility])    colon cancer  . Hypertension 01/23/2019  . SBO (small bowel obstruction) ([place]) 01/23/2019    SURGICAL HISTORY: Past Surgical History:  Procedure Laterality Date  . CESAREAN SECTION     x2  . COLOSTOMY N/A 01/24/2019   Procedure: End Loop Colostomy;  Surgeon: Clovis Riley, MD;  Location: [place];  Service: General;  Laterality: N/A;  . PARTIAL COLECTOMY N/A 01/24/2019   Procedure: PARTIAL COLECTOMY;  Surgeon: Clovis Riley, MD;  Location: [place];  Service: General;  Laterality: N/A;  . PORTACATH PLACEMENT Right 02/28/2019   Procedure: INSERTION PORT-A-CATH WITH ULTRASOUND GUIDANCE;  Surgeon: Clovis Riley, MD;  Location: [place];  Service: General;  Laterality: Right;    I have reviewed the social history and family history with the patient and they are unchanged from previous note.  ALLERGIES:  has No Known Allergies.  MEDICATIONS:  Current Outpatient Medications  Medication Sig Dispense Refill  . acetaminophen (TYLENOL) 325 MG tablet Take 2 tablets (650 mg total) by mouth every 6 (six) hours as needed.    Marland Kitchen amLODipine (NORVASC) 10 MG tablet Take 1 tablet (10 mg total) by mouth daily. 30 tablet 1  . ferrous sulfate 325 (65 FE) MG tablet TAKE 1 TABLET (325 MG TOTAL) BY MOUTH 2 (TWO) TIMES DAILY WITH A MEAL. 180 tablet 1  .  hydrALAZINE (APRESOLINE) 25 MG tablet TAKE 1 TABLET (25 MG TOTAL) BY MOUTH EVERY 8 (EIGHT) HOURS. 90 tablet 1  . hydrochlorothiazide (HYDRODIURIL) 12.5 MG tablet Take 1 tablet (12.5 mg total) by mouth daily. 30 tablet 1  . Ibuprofen (ADVIL) 200 MG CAPS Take 400 mg by mouth daily as needed (pain).    . isosorbide mononitrate (IMDUR) 30 MG 24 hr tablet TAKE 1 TABLET BY MOUTH EVERY DAY 90 tablet 0  . lidocaine-prilocaine (EMLA) cream Apply to affected area once (Patient taking differently: Apply 1 application topically once. Apply to affected area once) 30 g 3  . lisinopril (ZESTRIL) 40 MG tablet TAKE 1 TABLET BY MOUTH EVERY DAY 90 tablet 1  . loperamide (IMODIUM) 2 MG capsule TAKE 1 CAPSULE (2 MG TOTAL) BY MOUTH AS NEEDED FOR DIARRHEA OR LOOSE STOOLS. 90 capsule 0  . metoprolol tartrate (LOPRESSOR) 50 MG tablet TAKE 1 TABLET BY MOUTH TWICE A DAY 180 tablet 1  . metroNIDAZOLE (FLAGYL) 250 MG tablet Take 1 tablet (250 mg total) by mouth 3 (three) times daily. 1 tablet 0  . ondansetron (ZOFRAN) 4 MG tablet Take 1 tablet (4 mg total) by mouth every 6 (six) hours as needed for nausea. 30 tablet 0  . ondansetron (ZOFRAN) 8 MG tablet Take 1 tablet (8 mg total) by mouth 2 (two) times daily as needed for refractory nausea / vomiting. Start on day 3 after chemotherapy. 30 tablet  1  . polycarbophil (FIBERCON) 625 MG tablet Take 1 tablet (625 mg total) by mouth daily. 30 tablet 0  . prochlorperazine (COMPAZINE) 10 MG tablet Take 1 tablet (10 mg total) by mouth every 6 (six) hours as needed (Nausea or vomiting). 30 tablet 1   No current facility-administered medications for this visit.   Facility-Administered Medications Ordered in Other Visits  Medication Dose Route Frequency Provider Last Rate Last Admin  . 0.9 %  sodium chloride infusion   Intravenous Continuous Truitt Merle, MD 20 mL/hr at 07/30/19 1019 New Bag at 07/30/19 1019  . fluorouracil (ADRUCIL) 5,000 mg in sodium chloride 0.9 % 150 mL chemo infusion   2,470 mg/m2 (Treatment Plan Recorded) Intravenous 1 day or 1 dose Truitt Merle, MD      . leucovorin 808 mg in dextrose 5 % 250 mL infusion  400 mg/m2 (Treatment Plan Recorded) Intravenous Once Truitt Merle, MD 145 mL/hr at 07/30/19 1138 808 mg at 07/30/19 1138  . oxaliplatin (ELOXATIN) 170 mg in dextrose 5 % 500 mL chemo infusion  85 mg/m2 (Treatment Plan Recorded) Intravenous Once Truitt Merle, MD 267 mL/hr at 07/30/19 1140 170 mg at 07/30/19 1140  . sodium chloride flush (NS) 0.9 % injection 10 mL  10 mL Intracatheter PRN Truitt Merle, MD        PHYSICAL EXAMINATION: ECOG PERFORMANCE STATUS: 0 - Asymptomatic  Vitals:   07/30/19 0949 07/30/19 0952  BP: (!) 189/73 (!) 180/67  Pulse: (!) 54   Resp: 18   Temp: 98.7 F (37.1 C)   SpO2: 100%    Filed Weights   07/30/19 0949  Weight: 187 lb 9.6 oz (85.1 kg)    Due to COVID19 we will limit examination to appearance. Patient had no complaints.  GENERAL:alert, no distress and comfortable SKIN: no rashes or significant lesions (+) Skin darkening of hands  EYES: normal, Conjunctiva are pink and non-injected, sclera clear  NEURO: alert & oriented x 3 with fluent speech    LABORATORY DATA:  I have reviewed the data as listed CBC Latest Ref Rng & Units 07/30/2019 07/16/2019 07/02/2019  WBC 4.0 - 10.5 K/uL 5.4 4.6 4.4  Hemoglobin 12.0 - 15.0 g/dL 11.0(L) 10.6(L) 10.5(L)  Hematocrit 36.0 - 46.0 % 35.7(L) 33.4(L) 32.6(L)  Platelets 150 - 400 K/uL 143(L) 142(L) 134(L)     CMP Latest Ref Rng & Units 07/30/2019 07/16/2019 07/02/2019  Glucose 70 - 99 mg/dL 68(L) 76 88  BUN 6 - 20 mg/dL 27(H) 17 25(H)  Creatinine 0.44 - 1.00 mg/dL 0.94 0.88 0.86  Sodium 135 - 145 mmol/L 142 140 142  Potassium 3.5 - 5.1 mmol/L 4.7 4.1 4.3  Chloride 98 - 111 mmol/L 111 111 113(H)  CO2 22 - 32 mmol/L 23 23 22   Calcium 8.9 - 10.3 mg/dL 9.5 9.5 9.1  Total Protein 6.5 - 8.1 g/dL 7.6 7.0 6.9  Total Bilirubin 0.3 - 1.2 mg/dL 0.3 <0.2(L) 0.2(L)  Alkaline Phos 38 - 126 U/L 94 82  87  AST 15 - 41 U/L 28 22 25   ALT 0 - 44 U/L 19 18 19       RADIOGRAPHIC STUDIES: I have personally reviewed the radiological images as listed and agreed with the findings in the report. No results found.   ASSESSMENT & PLAN:  Casey Black is a 59 y.o. female with    1. Adenocarcinoma of transverse colon, moderately differentiated, pT4aN1aM1a stage IV with liverand nodalmetastasis,MSS, KRAS G12S(+) -Shewas diagnosed in 01/2019 afteremergent colectomy and liver biopsy.  Pathology showed stage IV colonic adenocarcinoma metastatic to liver.11/23/20PET showsknown liver metastasis and metastatic lymphadenopathy in chest and right axilla.  -Wepreviouslydiscussed her cancer is stage IV and no longer eligible for surgery. The likelihood of curing this is very lowdue to her diffuse metastasis, but still very treatable. -[name] shows MSI stable disease, Kras+. There are no targetable mutation, she is not a candidate for EGFRinhibitoror immunotherapy.Avastin was added with start of full dose FOLFOX3 weeks ago. -I started her on first-line5-FU for 2 cycles on 03/11/20 while her surgical wound healed. She started full doseFOLFOXwith avastin q2weeks on 1/4/21to control her disease. -Repeat CT CAP scan after C12  -She continues to tolerate treatment well with further skin darkening but no neuropathy. Labs reviewed, and adequate to proceed with C11 FOLFOX and Avastin today.  -Will continue to watch her HTN and for neuropathy on treatment.  -Repeat CT CAP scan after C12  -f/u in 2 weeks   2. Anorexia, resolved  -She reportedly weighed 253 lbs a few years ago, and weighed 203 lbs at symptom onset~11/2018 -190 lbsin 02/2019.  -Continue to f/u withdietician. -She has been eating well and walking often. She has been able to gain weigh, maintain appetite and strength.  -Weight stable.   3. Iron deficiency anemia, and anemia secondary to cancer -She had low  ferritinand low TIBC -Takes oral iron BID, will continue.Anemia continues to improve. -Will give IV iron if needed -Improved and mild today   4. HTN, uncontrolled  -on hydralazine, isosorbide, lisinopril, metoprolol. I started her on amlodipine on 07/16/19.  -She saw her new PCP on 4/16. Medications were not adjusted.  -I discussed avastin can increase her blood pressure.  -BP 180/67, which is an improvement but still elevated. I will call in HCTZ today (07/30/19). Continue to monitor.   5. Social support -She worked in [employer] at 2 different jobs, but has been out of work lately due to her symptoms  -She is not getting disability, no pay, but her employer kept her on their insurance plan -She may be eligible for Medicaid [employer] referred toSW to discuss   6. Goals of care discussion -The patient understands the goal of care is palliative. -she is full code now   PLAN: -I called in HCTZ 12.47m daily today -Labs reviewed and adequate to proceed with C11FOLFOX and avastintoday  -Lab, flush, f/u and FOLFOX and Avastin in 2, 4, 6, weeks  -CT CAP with contrast in 3-4 weeks    No problem-specific Assessment & Plan notes found for this encounter.   Orders Placed This Encounter  Procedures  . CT Abdomen Pelvis W Contrast    Standing Status:   Future    Standing Expiration Date:   07/29/2020    Order Specific Question:   If indicated for the ordered procedure, I authorize the administration of contrast media per Radiology protocol    Answer:   Yes    Order Specific Question:   Is patient pregnant?    Answer:   No    Order Specific Question:   [facility]    [facility]   Order Specific Question:   Is Oral Contrast requested for this exam?    Answer:   Yes, Per Radiology protocol    Order Specific Question:   Radiology Contrast Protocol - do NOT remove file path    Answer:   \\[email or web address]  . CT  Chest W Contrast    Standing Status:  Future    Standing Expiration Date:   07/29/2020    Order Specific Question:   If indicated for the ordered procedure, I authorize the administration of contrast media per Radiology protocol    Answer:   Yes    Order Specific Question:   Is patient pregnant?    Answer:   No    Order Specific Question:   Preferred imaging location?    Answer:   [facility]    Order Specific Question:   Radiology Contrast Protocol - do NOT remove file path    Answer:   \\[email or web address]   All questions were answered. The patient knows to call the clinic with any problems, questions or concerns. No barriers to learning was detected. The total time spent in the appointment was 30 minutes.     Truitt Merle, MD 07/30/2019   I, Joslyn Devon, am acting as scribe for Truitt Merle, MD.   I have reviewed the above documentation for accuracy and completeness, and I agree with the above.

## 2019-07-26 ENCOUNTER — Ambulatory Visit
Admission: RE | Admit: 2019-07-26 | Discharge: 2019-07-26 | Disposition: A | Discharge status: Home / Self Care | Payer: BC Managed Care – PPO | Source: Ambulatory Visit | Attending: Internal Medicine | Admitting: Internal Medicine

## 2019-07-26 DIAGNOSIS — Z1239 Encounter for other screening for malignant neoplasm of breast: Secondary | ICD-10-CM

## 2019-07-26 DIAGNOSIS — Z1231 Encounter for screening mammogram for malignant neoplasm of breast: Secondary | ICD-10-CM | POA: Diagnosis not present

## 2019-07-27 ENCOUNTER — Other Ambulatory Visit: Payer: Self-pay | Admitting: Internal Medicine

## 2019-07-27 DIAGNOSIS — R928 Other abnormal and inconclusive findings on diagnostic imaging of breast: Secondary | ICD-10-CM

## 2019-07-30 ENCOUNTER — Ambulatory Visit: Payer: BC Managed Care – PPO

## 2019-07-30 ENCOUNTER — Inpatient Hospital Stay: Payer: BC Managed Care – PPO

## 2019-07-30 ENCOUNTER — Encounter: Payer: Self-pay | Admitting: Hematology

## 2019-07-30 ENCOUNTER — Other Ambulatory Visit: Payer: BC Managed Care – PPO

## 2019-07-30 ENCOUNTER — Other Ambulatory Visit: Payer: Self-pay

## 2019-07-30 ENCOUNTER — Ambulatory Visit: Payer: BC Managed Care – PPO | Admitting: Hematology

## 2019-07-30 ENCOUNTER — Inpatient Hospital Stay (HOSPITAL_BASED_OUTPATIENT_CLINIC_OR_DEPARTMENT_OTHER): Payer: BC Managed Care – PPO | Admitting: Hematology

## 2019-07-30 VITALS — BP 180/67 | HR 54 | Temp 98.7°F | Resp 18 | Ht 67.0 in | Wt 187.6 lb

## 2019-07-30 DIAGNOSIS — I10 Essential (primary) hypertension: Secondary | ICD-10-CM

## 2019-07-30 DIAGNOSIS — Z452 Encounter for adjustment and management of vascular access device: Secondary | ICD-10-CM | POA: Diagnosis not present

## 2019-07-30 DIAGNOSIS — C189 Malignant neoplasm of colon, unspecified: Secondary | ICD-10-CM | POA: Diagnosis not present

## 2019-07-30 DIAGNOSIS — D500 Iron deficiency anemia secondary to blood loss (chronic): Secondary | ICD-10-CM | POA: Diagnosis not present

## 2019-07-30 DIAGNOSIS — Z7189 Other specified counseling: Secondary | ICD-10-CM

## 2019-07-30 DIAGNOSIS — Z5111 Encounter for antineoplastic chemotherapy: Secondary | ICD-10-CM | POA: Diagnosis not present

## 2019-07-30 DIAGNOSIS — D509 Iron deficiency anemia, unspecified: Secondary | ICD-10-CM | POA: Diagnosis not present

## 2019-07-30 DIAGNOSIS — Z95828 Presence of other vascular implants and grafts: Secondary | ICD-10-CM

## 2019-07-30 DIAGNOSIS — C787 Secondary malignant neoplasm of liver and intrahepatic bile duct: Secondary | ICD-10-CM

## 2019-07-30 DIAGNOSIS — D630 Anemia in neoplastic disease: Secondary | ICD-10-CM | POA: Diagnosis not present

## 2019-07-30 DIAGNOSIS — Z5112 Encounter for antineoplastic immunotherapy: Secondary | ICD-10-CM | POA: Diagnosis not present

## 2019-07-30 DIAGNOSIS — C184 Malignant neoplasm of transverse colon: Secondary | ICD-10-CM | POA: Diagnosis not present

## 2019-07-30 DIAGNOSIS — C773 Secondary and unspecified malignant neoplasm of axilla and upper limb lymph nodes: Secondary | ICD-10-CM | POA: Diagnosis not present

## 2019-07-30 LAB — CBC WITH DIFFERENTIAL (CANCER CENTER ONLY)
Abs Immature Granulocytes: 0.01 10*3/uL (ref 0.00–0.07)
Basophils Absolute: 0 10*3/uL (ref 0.0–0.1)
Basophils Relative: 1 %
Eosinophils Absolute: 0.2 10*3/uL (ref 0.0–0.5)
Eosinophils Relative: 3 %
HCT: 35.7 % — ABNORMAL LOW (ref 36.0–46.0)
Hemoglobin: 11 g/dL — ABNORMAL LOW (ref 12.0–15.0)
Immature Granulocytes: 0 %
Lymphocytes Relative: 40 %
Lymphs Abs: 2.1 10*3/uL (ref 0.7–4.0)
MCH: 29.2 pg (ref 26.0–34.0)
MCHC: 30.8 g/dL (ref 30.0–36.0)
MCV: 94.7 fL (ref 80.0–100.0)
Monocytes Absolute: 0.6 10*3/uL (ref 0.1–1.0)
Monocytes Relative: 11 %
Neutro Abs: 2.4 10*3/uL (ref 1.7–7.7)
Neutrophils Relative %: 45 %
Platelet Count: 143 10*3/uL — ABNORMAL LOW (ref 150–400)
RBC: 3.77 MIL/uL — ABNORMAL LOW (ref 3.87–5.11)
RDW: 18 % — ABNORMAL HIGH (ref 11.5–15.5)
WBC Count: 5.4 10*3/uL (ref 4.0–10.5)
nRBC: 0 % (ref 0.0–0.2)

## 2019-07-30 LAB — CMP (CANCER CENTER ONLY)
ALT: 19 U/L (ref 0–44)
AST: 28 U/L (ref 15–41)
Albumin: 3.6 g/dL (ref 3.5–5.0)
Alkaline Phosphatase: 94 U/L (ref 38–126)
Anion gap: 8 (ref 5–15)
BUN: 27 mg/dL — ABNORMAL HIGH (ref 6–20)
CO2: 23 mmol/L (ref 22–32)
Calcium: 9.5 mg/dL (ref 8.9–10.3)
Chloride: 111 mmol/L (ref 98–111)
Creatinine: 0.94 mg/dL (ref 0.44–1.00)
GFR, Est AFR Am: >60 mL/min (ref >60)
GFR, Estimated: >60 mL/min (ref >60)
Glucose, Bld: 68 mg/dL — ABNORMAL LOW (ref 70–99)
Potassium: 4.7 mmol/L (ref 3.5–5.1)
Sodium: 142 mmol/L (ref 135–145)
Total Bilirubin: 0.3 mg/dL (ref 0.3–1.2)
Total Protein: 7.6 g/dL (ref 6.5–8.1)

## 2019-07-30 LAB — TOTAL PROTEIN, URINE DIPSTICK: Protein, ur: <30 mg/dL — AB

## 2019-07-30 MED ORDER — SODIUM CHLORIDE 0.9% FLUSH
10.0000 mL | INTRAVENOUS | Status: DC | PRN
  Administered 2019-07-30: 10 mL
  Filled 2019-07-30: qty 10

## 2019-07-30 MED ORDER — HYDROCHLOROTHIAZIDE 12.5 MG PO TABS
12.5000 mg | ORAL_TABLET | Freq: Every day | ORAL | 1 refills | Status: DC
Start: 2019-07-30 — End: 2019-08-27

## 2019-07-30 MED ORDER — SODIUM CHLORIDE 0.9% FLUSH
10.0000 mL | INTRAVENOUS | Status: DC | PRN
  Filled 2019-07-30: qty 10

## 2019-07-30 MED ORDER — PALONOSETRON HCL INJECTION 0.25 MG/5ML
INTRAVENOUS | Status: AC
  Filled 2019-07-30: qty 5

## 2019-07-30 MED ORDER — LEUCOVORIN CALCIUM INJECTION 350 MG
400.0000 mg/m2 | Freq: Once | INTRAVENOUS | Status: AC
  Administered 2019-07-30: 808 mg via INTRAVENOUS
  Filled 2019-07-30: qty 40.4

## 2019-07-30 MED ORDER — SODIUM CHLORIDE 0.9 % IV SOLN
10.0000 mg | Freq: Once | INTRAVENOUS | Status: AC
  Administered 2019-07-30: 10 mg via INTRAVENOUS
  Filled 2019-07-30: qty 10

## 2019-07-30 MED ORDER — OXALIPLATIN CHEMO INJECTION 100 MG/20ML
85.0000 mg/m2 | Freq: Once | INTRAVENOUS | Status: AC
  Administered 2019-07-30: 170 mg via INTRAVENOUS
  Filled 2019-07-30: qty 34

## 2019-07-30 MED ORDER — PALONOSETRON HCL INJECTION 0.25 MG/5ML
0.2500 mg | Freq: Once | INTRAVENOUS | Status: AC
  Administered 2019-07-30: 0.25 mg via INTRAVENOUS

## 2019-07-30 MED ORDER — SODIUM CHLORIDE 0.9 % IV SOLN
5.0000 mg/kg | Freq: Once | INTRAVENOUS | Status: AC
  Administered 2019-07-30: 400 mg via INTRAVENOUS
  Filled 2019-07-30: qty 16

## 2019-07-30 MED ORDER — SODIUM CHLORIDE 0.9 % IV SOLN
INTRAVENOUS | Status: DC
  Filled 2019-07-30: qty 250

## 2019-07-30 MED ORDER — DEXTROSE 5 % IV SOLN
Freq: Once | INTRAVENOUS | Status: AC
  Filled 2019-07-30: qty 250

## 2019-07-30 MED ORDER — SODIUM CHLORIDE 0.9 % IV SOLN
2470.0000 mg/m2 | INTRAVENOUS | Status: DC
  Administered 2019-07-30: 5000 mg via INTRAVENOUS
  Filled 2019-07-30: qty 100

## 2019-07-30 NOTE — Patient Instructions (Signed)

## 2019-07-30 NOTE — Patient Instructions (Signed)
[facility] Discharge Instructions for Patients Receiving Chemotherapy  Today you received the following chemotherapy agents: Oxaliplatin, leucovorin, 5FU, bevacizumab  To help prevent nausea and vomiting after your treatment, we encourage you to take your nausea medication as directed.   If you develop nausea and vomiting that is not controlled by your nausea medication, call the clinic.   BELOW ARE SYMPTOMS THAT SHOULD BE REPORTED IMMEDIATELY:  *FEVER GREATER THAN 100.5 F  *CHILLS WITH OR WITHOUT FEVER  NAUSEA AND VOMITING THAT IS NOT CONTROLLED WITH YOUR NAUSEA MEDICATION  *UNUSUAL SHORTNESS OF BREATH  *UNUSUAL BRUISING OR BLEEDING  TENDERNESS IN MOUTH AND THROAT WITH OR WITHOUT PRESENCE OF ULCERS  *URINARY PROBLEMS  *BOWEL PROBLEMS  UNUSUAL RASH Items with * indicate a potential emergency and should be followed up as soon as possible.  Feel free to call the clinic should you have any questions or concerns. The clinic phone number is [phone].  Please show the CHEMO ALERT CARD at check-in to the Emergency Department and triage nurse.   

## 2019-07-31 ENCOUNTER — Ambulatory Visit
Admission: RE | Admit: 2019-07-31 | Discharge: 2019-07-31 | Disposition: A | Discharge status: Home / Self Care | Payer: BC Managed Care – PPO | Source: Ambulatory Visit | Attending: Internal Medicine | Admitting: Internal Medicine

## 2019-07-31 DIAGNOSIS — N6012 Diffuse cystic mastopathy of left breast: Secondary | ICD-10-CM | POA: Diagnosis not present

## 2019-07-31 DIAGNOSIS — R928 Other abnormal and inconclusive findings on diagnostic imaging of breast: Secondary | ICD-10-CM

## 2019-08-01 ENCOUNTER — Inpatient Hospital Stay: Payer: BC Managed Care – PPO

## 2019-08-01 ENCOUNTER — Other Ambulatory Visit: Payer: Self-pay

## 2019-08-01 VITALS — BP 155/57 | HR 50 | Temp 98.2°F | Resp 19

## 2019-08-01 DIAGNOSIS — C773 Secondary and unspecified malignant neoplasm of axilla and upper limb lymph nodes: Secondary | ICD-10-CM | POA: Diagnosis not present

## 2019-08-01 DIAGNOSIS — Z452 Encounter for adjustment and management of vascular access device: Secondary | ICD-10-CM | POA: Diagnosis not present

## 2019-08-01 DIAGNOSIS — Z7189 Other specified counseling: Secondary | ICD-10-CM

## 2019-08-01 DIAGNOSIS — C184 Malignant neoplasm of transverse colon: Secondary | ICD-10-CM | POA: Diagnosis not present

## 2019-08-01 DIAGNOSIS — C189 Malignant neoplasm of colon, unspecified: Secondary | ICD-10-CM

## 2019-08-01 DIAGNOSIS — D630 Anemia in neoplastic disease: Secondary | ICD-10-CM | POA: Diagnosis not present

## 2019-08-01 DIAGNOSIS — D509 Iron deficiency anemia, unspecified: Secondary | ICD-10-CM | POA: Diagnosis not present

## 2019-08-01 DIAGNOSIS — Z5112 Encounter for antineoplastic immunotherapy: Secondary | ICD-10-CM | POA: Diagnosis not present

## 2019-08-01 DIAGNOSIS — Z5111 Encounter for antineoplastic chemotherapy: Secondary | ICD-10-CM | POA: Diagnosis not present

## 2019-08-01 DIAGNOSIS — C787 Secondary malignant neoplasm of liver and intrahepatic bile duct: Secondary | ICD-10-CM | POA: Diagnosis not present

## 2019-08-01 MED ORDER — HEPARIN SOD (PORK) LOCK FLUSH 100 UNIT/ML IV SOLN
500.0000 [IU] | Freq: Once | INTRAVENOUS | Status: AC | PRN
  Administered 2019-08-01: 500 [IU]
  Filled 2019-08-01: qty 5

## 2019-08-01 MED ORDER — SODIUM CHLORIDE 0.9% FLUSH
10.0000 mL | INTRAVENOUS | Status: DC | PRN
  Administered 2019-08-01: 12:00:00 10 mL
  Filled 2019-08-01: qty 10

## 2019-08-07 ENCOUNTER — Other Ambulatory Visit: Payer: Self-pay | Admitting: Internal Medicine

## 2019-08-07 ENCOUNTER — Other Ambulatory Visit: Payer: Self-pay | Admitting: Hematology

## 2019-08-07 DIAGNOSIS — H5213 Myopia, bilateral: Secondary | ICD-10-CM | POA: Diagnosis not present

## 2019-08-08 NOTE — Progress Notes (Signed)
Pharmacist Chemotherapy Monitoring - Follow Up Assessment    I verify that I have reviewed each item in the below checklist:  . Regimen for the patient is scheduled for the appropriate day and plan matches scheduled date. Marland Kitchen Appropriate non-routine labs are ordered dependent on drug ordered. . If applicable, additional medications reviewed and ordered per protocol based on lifetime cumulative doses and/or treatment regimen.   Plan for follow-up and/or issues identified: No . I-vent associated with next due treatment: Yes . MD and/or nursing notified: No   Kennith Center, Pharm.D., CPP 08/08/2019@9 :37 AM

## 2019-08-10 DIAGNOSIS — C189 Malignant neoplasm of colon, unspecified: Secondary | ICD-10-CM | POA: Diagnosis not present

## 2019-08-13 NOTE — Progress Notes (Signed)
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 Bowel-gas pattern consistent with small bowel obstruction. No free air. 2. No acute chest findings.   01/23/2019 Imaging   CT AP IMPRESSION: Obstructing mid transverse colonic mass with mild regional adenopathy and hepatic metastatic disease. The mass likely extends through the serosa; no ascites or peritoneal nodularity.   01/24/2019 Surgery   Surgeon: Clovis Riley MD Assistant: [name] Procedure performed: Transverse colectomy with end colostomy, liver biopsy Procedure classification: URGENT/EMERGENT Preop diagnosis: Obstructing, metastatic transverse colon mass Post-op diagnosis/intraop findings: Same   01/24/2019 Pathology Results   FINAL MICROSCOPIC DIAGNOSIS:   A. COLON, TRANSVERSE, RESECTION:  Colonic adenocarcinoma, 5 cm.  Carcinoma extends into pericolonic connective tissue and focally to  serosal surface.  Margins not involved.  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   B. LIVER NODULE, LEFT, BIOPSY:  Metastatic adenocarcinoma.    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 Hypermetabolic metastatic disease in the liver and mediastinal/hilar/axillary lymph nodes. 2. Focal hypermetabolism in the rectum. Continued attention on follow-up exams is warranted. 3. Focal hypermetabolism medial to the right adrenal gland may be within a metastatic lymph node, better visualized on 01/23/2019. 4. Aortic atherosclerosis (ICD10-170.0). Coronary artery calcification.   03/12/2019 -  Chemotherapy   She started 5FU q2weeks on 03/12/19 for 2 cycles. She started full dose FOLFOX with Avastin on 04/09/19.   05/31/2019 Imaging   Restaging CT CAP IMPRESSION: 1. Similar to mild interval decrease in size of multiple hepatic lesions, partially calcified. 2. 2 mm right upper lobe pulmonary nodule. Recommend attention on follow-up. 3. Emphysema and aortic atherosclerosis.     CURRENT THERAPY:  She started 5FU q2weeks on 03/12/19 for 2 cycles. She started full dose FOLFOX with Avastin on 04/09/19.  INTERVAL HISTORY: Ms. Lanpher returns for f/u and treatment as scheduled. She completed cycle 11 FOLFOX/avastin on 07/30/19. She saw her PCP and had a mammogram/US in the interval that showed breast cysts. She started a new BP med, she reports SBP in 120-130's at home. Denies symptoms of hypotension. Last treatment went well. She had mild nausea and cold sensitivity for a couple days. No residual neuropathy. Nausea managed with zofran PRN. She is able to eat and drink well. Energy level is good, walks 4 laps around her complex daily. Her granddaughter had a little cold and Ms. Kuk had a slight cough for a few days that resolved. She gets stressed "thinking about death" at times and wonders if  she can have a small glass of wine occasionally to "take the edge off."   Otherwise,  she denies fever, chills, mucositis, chest pain, dyspnea, leg edema, or pain.    MEDICAL HISTORY:  Past Medical History:  Diagnosis Date  . Anemia    low iron  . Cancer [facility])    colon cancer  . Colon cancer ([place]) 01/2019  . Hypertension 01/23/2019  . Personal history of chemotherapy 01/2019   colon CA  . SBO (small bowel obstruction) ([place]) 01/23/2019    SURGICAL HISTORY: Past Surgical History:  Procedure Laterality Date  . CESAREAN SECTION     x2  . COLOSTOMY N/A 01/24/2019   Procedure: End Loop Colostomy;  Surgeon: Clovis Riley, MD;  Location: [place];  Service: General;  Laterality: N/A;  . PARTIAL COLECTOMY N/A 01/24/2019   Procedure: PARTIAL COLECTOMY;  Surgeon: Clovis Riley, MD;  Location: [place];  Service: General;  Laterality: N/A;  . PORTACATH PLACEMENT Right 02/28/2019   Procedure: INSERTION PORT-A-CATH WITH ULTRASOUND GUIDANCE;  Surgeon: Clovis Riley, MD;  Location: [place];  Service: General;  Laterality: Right;    I have reviewed the social history and family history with the patient and they are unchanged from previous note.  ALLERGIES:  has No Known Allergies.  MEDICATIONS:  Current Outpatient Medications  Medication Sig Dispense Refill  . acetaminophen (TYLENOL) 325 MG tablet Take 2 tablets (650 mg total) by mouth every 6 (six) hours as needed.    Marland Kitchen amLODipine (NORVASC) 10 MG tablet TAKE 1 TABLET BY MOUTH EVERY DAY 30 tablet 1  . ferrous sulfate 325 (65 FE) MG tablet TAKE 1 TABLET (325 MG TOTAL) BY MOUTH 2 (TWO) TIMES DAILY WITH A MEAL. 180 tablet 1  . hydrALAZINE (APRESOLINE) 25 MG tablet TAKE 1 TABLET (25 MG TOTAL) BY MOUTH EVERY 8 (EIGHT) HOURS. 90 tablet 1  . hydrochlorothiazide (HYDRODIURIL) 12.5 MG tablet Take 1 tablet (12.5 mg total) by mouth daily. 30 tablet 1  . Ibuprofen (ADVIL) 200 MG CAPS Take 400 mg by mouth daily as needed (pain).    . isosorbide mononitrate (IMDUR) 30 MG 24 hr tablet TAKE 1 TABLET BY MOUTH EVERY DAY 90 tablet 0  .  lidocaine-prilocaine (EMLA) cream Apply to affected area once (Patient taking differently: Apply 1 application topically once. Apply to affected area once) 30 g 3  . lisinopril (ZESTRIL) 40 MG tablet TAKE 1 TABLET BY MOUTH EVERY DAY 90 tablet 1  . metoprolol tartrate (LOPRESSOR) 50 MG tablet TAKE 1 TABLET BY MOUTH TWICE A DAY 180 tablet 1  . metroNIDAZOLE (FLAGYL) 250 MG tablet Take 1 tablet (250 mg total) by mouth 3 (three) times daily. 1 tablet 0  . polycarbophil (FIBERCON) 625 MG tablet Take 1 tablet (625 mg total) by mouth daily. 30 tablet 0  . prochlorperazine (COMPAZINE) 10 MG tablet Take 1 tablet (10 mg total) by mouth every 6 (six) hours as needed (Nausea or vomiting). 30 tablet 1  . loperamide (IMODIUM) 2 MG capsule TAKE 1 CAPSULE (2 MG TOTAL) BY MOUTH AS NEEDED FOR DIARRHEA OR LOOSE STOOLS. (Patient not taking: Reported on 08/14/2019) 90 capsule 0  . ondansetron (ZOFRAN) 4 MG tablet Take 1 tablet (4 mg total) by mouth every 6 (six) hours as needed for nausea. (Patient not taking: Reported on 08/14/2019) 30 tablet 0  . ondansetron (ZOFRAN) 8 MG tablet Take 1 tablet (8 mg total) by mouth 2 (two) times daily as needed for refractory nausea / vomiting. Start  on day 3 after chemotherapy. (Patient not taking: Reported on 08/14/2019) 30 tablet 1   No current facility-administered medications for this visit.   Facility-Administered Medications Ordered in Other Visits  Medication Dose Route Frequency Provider Last Rate Last Admin  . 0.9 %  sodium chloride infusion   Intravenous Continuous Truitt Merle, MD 20 mL/hr at 08/14/19 1004 New Bag at 08/14/19 1004  . bevacizumab-bvzr (ZIRABEV) 400 mg in sodium chloride 0.9 % 100 mL chemo infusion  5 mg/kg (Treatment Plan Recorded) Intravenous Once Truitt Merle, MD      . dextrose 5 % solution   Intravenous Once Truitt Merle, MD      . dextrose 5 % solution   Intravenous Once Truitt Merle, MD      . fluorouracil (ADRUCIL) 5,000 mg in sodium chloride 0.9 % 150 mL chemo  infusion  2,470 mg/m2 (Treatment Plan Recorded) Intravenous 1 day or 1 dose Truitt Merle, MD      . heparin lock flush 100 unit/mL  500 Units Intracatheter Once PRN Truitt Merle, MD      . leucovorin 808 mg in dextrose 5 % 250 mL infusion  400 mg/m2 (Treatment Plan Recorded) Intravenous Once Truitt Merle, MD      . oxaliplatin (ELOXATIN) 170 mg in dextrose 5 % 500 mL chemo infusion  85 mg/m2 (Treatment Plan Recorded) Intravenous Once Truitt Merle, MD      . sodium chloride flush (NS) 0.9 % injection 10 mL  10 mL Intracatheter PRN Truitt Merle, MD        PHYSICAL EXAMINATION: ECOG PERFORMANCE STATUS: 0 - Asymptomatic  Vitals:   08/14/19 0907 08/14/19 0908  BP: (!) 169/69 (!) 129/59  Pulse: (!) 54   Resp: 18   Temp: 98.5 F (36.9 C)   SpO2: 100%    Filed Weights   08/14/19 0907  Weight: 190 lb 4.8 oz (86.3 kg)    GENERAL:alert, no distress and comfortable SKIN: no rash. Palms dry with hyperpigmentation  EYES:  sclera clear LUNGS: clear with normal breathing effort HEART: regular rate & rhythm, no lower extremity edema ABDOMEN: midline incision closed and healed. Colostomy with soft stool in collection bag  NEURO: alert & oriented x 3 with fluent speech, normal gait  PAC without erythema   LABORATORY DATA:  I have reviewed the data as listed CBC Latest Ref Rng & Units 08/14/2019 07/30/2019 07/16/2019  WBC 4.0 - 10.5 K/uL 4.0 5.4 4.6  Hemoglobin 12.0 - 15.0 g/dL 10.3(L) 11.0(L) 10.6(L)  Hematocrit 36.0 - 46.0 % 33.2(L) 35.7(L) 33.4(L)  Platelets 150 - 400 K/uL 120(L) 143(L) 142(L)     CMP Latest Ref Rng & Units 08/14/2019 07/30/2019 07/16/2019  Glucose 70 - 99 mg/dL 90 68(L) 76  BUN 6 - 20 mg/dL 27(H) 27(H) 17  Creatinine 0.44 - 1.00 mg/dL 0.95 0.94 0.88  Sodium 135 - 145 mmol/L 140 142 140  Potassium 3.5 - 5.1 mmol/L 4.3 4.7 4.1  Chloride 98 - 111 mmol/L 112(H) 111 111  CO2 22 - 32 mmol/L 20(L) 23 23  Calcium 8.9 - 10.3 mg/dL 9.5 9.5 9.5  Total Protein 6.5 - 8.1 g/dL 7.3 7.6 7.0  Total  Bilirubin 0.3 - 1.2 mg/dL 0.2(L) 0.3 <0.2(L)  Alkaline Phos 38 - 126 U/L 100 94 82  AST 15 - 41 U/L _0 ALT 0 - 44 U/L _1 RADIOGRAPHIC STUDIES: I have personally reviewed the radiological images as listed and agreed with the  findings in the report. No results found.   ASSESSMENT & PLAN: LILLYEN SCHOW a 58y.o.femalewith   1. Adenocarcinoma of transverse colon, moderately differentiated, pT4aN1aM1a stage IV with liverand nodalmetastasis, MMR normal  -Diagnosed in 01/2019 afteremergent colectomy and liver biopsy. 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  -PET from 02/26/19 showsknown liver metastasis and metastatic lymphadenopathy in chest and right axilla.  -[name] shows MSI stable disease, Kras+ No targetable mutation, she is not a candidate for EGFR or immunotherapy. -Began palliative first line chemo, received 5FU/leuc with first 2 cycles for large open abdominal wound after surgery. She started full dose FOLFOX and avastin with cycle 2, s/p 11 cycles of treatment  2.Nutrition/Anorexia  -she reportedly weighed 253 lbs a few years ago, and weighed 203 lbs at symptom onset~11/2018 -190 lbsin 02/2019.  -Continue to f/u withdietician -she has been able to gain weight on treatment -stable lately  3. Iron deficiency anemia, and anemia secondary to cancer -She had low ferritinand low TIBC -IDA responding to ferrous sulfate BID, continue  4. HTN -Initially on hydralazine, isosorbide, lisinopril, metoprolol.  -Avastin and anxiety contributing to BP elevation in clinic. Received clonidine PRN on chemo days.  -Recently added amlodipine and HCTZ with improvement. Tolerating well without sx of hypotension  5. Social support -She worked in [employer] at 2 different jobs, but has been out of work lately due to her symptoms  -She is not getting disability, no pay, but her employer kept her on their insurance  plan -has been referred to SW to discuss financial option/resources  6. Goals of care discussion -Goal is palliative, to control disease and prolong her life. -wehavediscussedher cancer is not curable at this stage, and that we will continue treatment as long as she can tolerate and her disease is controlled.She understands. -full code  Disposition: Ms. Francom appears stable. She completed 11 cycles of chemotherapy. She continues to tolerate treatment very well overall with mild nausea and cold sensitivity. She is able to recover well. Labs adequate to proceed with cycle 12 FOLFOX and avastin today. Her CEA is stable to minimally decreased on chemo. She is scheduled for restaging CT CAP on 5/20.   She is due for second COVID19 vaccine which I recommend to postpone until next week, after she recovers from this cycle. She notes her PCP has her to return 5/21 for shingles and tetanus vaccines. The shingles vaccine is contraindicated while on chemo. She also needs to wait at least 2 weeks after COVID19 vaccine for any additional vaccines. I will let Dr. [name] know.   For situational stress and anxiety, Ms. Zwilling is asking to have small glass of wine every now and then to take the edge off. I think this is reasonable to do sparingly on her off-chemo week. She understands.   She will return for f/u and next cycle on 5/24.   All questions were answered. The patient knows to call the clinic with any problems, questions or concerns. No barriers to learning was detected.     [name], NP 08/14/19

## 2019-08-14 ENCOUNTER — Ambulatory Visit: Payer: BC Managed Care – PPO

## 2019-08-14 ENCOUNTER — Inpatient Hospital Stay (HOSPITAL_BASED_OUTPATIENT_CLINIC_OR_DEPARTMENT_OTHER): Payer: BC Managed Care – PPO | Admitting: Nurse Practitioner

## 2019-08-14 ENCOUNTER — Inpatient Hospital Stay: Payer: BC Managed Care – PPO

## 2019-08-14 ENCOUNTER — Encounter: Payer: Self-pay | Admitting: Nurse Practitioner

## 2019-08-14 ENCOUNTER — Inpatient Hospital Stay: Payer: BC Managed Care – PPO | Attending: Nurse Practitioner

## 2019-08-14 ENCOUNTER — Other Ambulatory Visit: Payer: Self-pay

## 2019-08-14 VITALS — BP 129/59 | HR 54 | Temp 98.5°F | Resp 18 | Ht 67.0 in | Wt 190.3 lb

## 2019-08-14 VITALS — BP 131/61

## 2019-08-14 DIAGNOSIS — Z95828 Presence of other vascular implants and grafts: Secondary | ICD-10-CM

## 2019-08-14 DIAGNOSIS — Z5112 Encounter for antineoplastic immunotherapy: Secondary | ICD-10-CM | POA: Insufficient documentation

## 2019-08-14 DIAGNOSIS — Z7189 Other specified counseling: Secondary | ICD-10-CM

## 2019-08-14 DIAGNOSIS — C787 Secondary malignant neoplasm of liver and intrahepatic bile duct: Secondary | ICD-10-CM

## 2019-08-14 DIAGNOSIS — C773 Secondary and unspecified malignant neoplasm of axilla and upper limb lymph nodes: Secondary | ICD-10-CM | POA: Diagnosis not present

## 2019-08-14 DIAGNOSIS — C189 Malignant neoplasm of colon, unspecified: Secondary | ICD-10-CM

## 2019-08-14 DIAGNOSIS — C184 Malignant neoplasm of transverse colon: Secondary | ICD-10-CM | POA: Diagnosis not present

## 2019-08-14 DIAGNOSIS — Z5111 Encounter for antineoplastic chemotherapy: Secondary | ICD-10-CM | POA: Insufficient documentation

## 2019-08-14 LAB — CMP (CANCER CENTER ONLY)
ALT: 23 U/L (ref 0–44)
AST: 26 U/L (ref 15–41)
Albumin: 3.4 g/dL — ABNORMAL LOW (ref 3.5–5.0)
Alkaline Phosphatase: 100 U/L (ref 38–126)
Anion gap: 8 (ref 5–15)
BUN: 27 mg/dL — ABNORMAL HIGH (ref 6–20)
CO2: 20 mmol/L — ABNORMAL LOW (ref 22–32)
Calcium: 9.5 mg/dL (ref 8.9–10.3)
Chloride: 112 mmol/L — ABNORMAL HIGH (ref 98–111)
Creatinine: 0.95 mg/dL (ref 0.44–1.00)
GFR, Est AFR Am: >60 mL/min (ref >60)
GFR, Estimated: >60 mL/min (ref >60)
Glucose, Bld: 90 mg/dL (ref 70–99)
Potassium: 4.3 mmol/L (ref 3.5–5.1)
Sodium: 140 mmol/L (ref 135–145)
Total Bilirubin: 0.2 mg/dL — ABNORMAL LOW (ref 0.3–1.2)
Total Protein: 7.3 g/dL (ref 6.5–8.1)

## 2019-08-14 LAB — CBC WITH DIFFERENTIAL (CANCER CENTER ONLY)
Abs Immature Granulocytes: 0.01 10*3/uL (ref 0.00–0.07)
Basophils Absolute: 0 10*3/uL (ref 0.0–0.1)
Basophils Relative: 1 %
Eosinophils Absolute: 0.1 10*3/uL (ref 0.0–0.5)
Eosinophils Relative: 3 %
HCT: 33.2 % — ABNORMAL LOW (ref 36.0–46.0)
Hemoglobin: 10.3 g/dL — ABNORMAL LOW (ref 12.0–15.0)
Immature Granulocytes: 0 %
Lymphocytes Relative: 36 %
Lymphs Abs: 1.4 10*3/uL (ref 0.7–4.0)
MCH: 29.7 pg (ref 26.0–34.0)
MCHC: 31 g/dL (ref 30.0–36.0)
MCV: 95.7 fL (ref 80.0–100.0)
Monocytes Absolute: 0.5 10*3/uL (ref 0.1–1.0)
Monocytes Relative: 12 %
Neutro Abs: 1.9 10*3/uL (ref 1.7–7.7)
Neutrophils Relative %: 48 %
Platelet Count: 120 10*3/uL — ABNORMAL LOW (ref 150–400)
RBC: 3.47 MIL/uL — ABNORMAL LOW (ref 3.87–5.11)
RDW: 17.2 % — ABNORMAL HIGH (ref 11.5–15.5)
WBC Count: 4 10*3/uL (ref 4.0–10.5)
nRBC: 0 % (ref 0.0–0.2)

## 2019-08-14 LAB — IRON AND TIBC
Iron: 87 ug/dL (ref 41–142)
Saturation Ratios: 32 % (ref 21–57)
TIBC: 274 ug/dL (ref 236–444)
UIBC: 187 ug/dL (ref 120–384)

## 2019-08-14 LAB — FERRITIN: Ferritin: 192 ng/mL (ref 11–307)

## 2019-08-14 LAB — TOTAL PROTEIN, URINE DIPSTICK: Protein, ur: NEGATIVE mg/dL

## 2019-08-14 LAB — CEA (IN HOUSE-CHCC): CEA (CHCC-In House): 52.64 ng/mL — ABNORMAL HIGH (ref 0.00–5.00)

## 2019-08-14 MED ORDER — DEXTROSE 5 % IV SOLN
Freq: Once | INTRAVENOUS | Status: AC
  Filled 2019-08-14: qty 250

## 2019-08-14 MED ORDER — SODIUM CHLORIDE 0.9 % IV SOLN
10.0000 mg | Freq: Once | INTRAVENOUS | Status: AC
  Administered 2019-08-14: 10 mg via INTRAVENOUS
  Filled 2019-08-14: qty 10

## 2019-08-14 MED ORDER — SODIUM CHLORIDE 0.9 % IV SOLN
INTRAVENOUS | Status: DC
  Filled 2019-08-14: qty 250

## 2019-08-14 MED ORDER — SODIUM CHLORIDE 0.9% FLUSH
10.0000 mL | INTRAVENOUS | Status: DC | PRN
  Administered 2019-08-14: 10 mL
  Filled 2019-08-14: qty 10

## 2019-08-14 MED ORDER — HEPARIN SOD (PORK) LOCK FLUSH 100 UNIT/ML IV SOLN
500.0000 [IU] | Freq: Once | INTRAVENOUS | Status: DC | PRN
  Filled 2019-08-14: qty 5

## 2019-08-14 MED ORDER — SODIUM CHLORIDE 0.9 % IV SOLN
2470.0000 mg/m2 | INTRAVENOUS | Status: DC
  Administered 2019-08-14: 5000 mg via INTRAVENOUS
  Filled 2019-08-14: qty 100

## 2019-08-14 MED ORDER — SODIUM CHLORIDE 0.9 % IV SOLN
5.0000 mg/kg | Freq: Once | INTRAVENOUS | Status: AC
  Administered 2019-08-14: 400 mg via INTRAVENOUS
  Filled 2019-08-14: qty 16

## 2019-08-14 MED ORDER — OXALIPLATIN CHEMO INJECTION 100 MG/20ML
85.0000 mg/m2 | Freq: Once | INTRAVENOUS | Status: AC
  Administered 2019-08-14: 170 mg via INTRAVENOUS
  Filled 2019-08-14: qty 34

## 2019-08-14 MED ORDER — LEUCOVORIN CALCIUM INJECTION 350 MG
400.0000 mg/m2 | Freq: Once | INTRAVENOUS | Status: AC
  Administered 2019-08-14: 808 mg via INTRAVENOUS
  Filled 2019-08-14: qty 40.4

## 2019-08-14 MED ORDER — SODIUM CHLORIDE 0.9% FLUSH
10.0000 mL | INTRAVENOUS | Status: DC | PRN
  Filled 2019-08-14: qty 10

## 2019-08-14 MED ORDER — PALONOSETRON HCL INJECTION 0.25 MG/5ML
INTRAVENOUS | Status: AC
  Filled 2019-08-14: qty 5

## 2019-08-14 MED ORDER — PALONOSETRON HCL INJECTION 0.25 MG/5ML
0.2500 mg | Freq: Once | INTRAVENOUS | Status: AC
  Administered 2019-08-14: 0.25 mg via INTRAVENOUS

## 2019-08-14 NOTE — Patient Instructions (Signed)

## 2019-08-16 ENCOUNTER — Inpatient Hospital Stay: Payer: BC Managed Care – PPO

## 2019-08-16 ENCOUNTER — Other Ambulatory Visit: Payer: Self-pay

## 2019-08-16 VITALS — BP 165/63 | HR 50 | Temp 98.5°F | Resp 18

## 2019-08-16 DIAGNOSIS — C787 Secondary malignant neoplasm of liver and intrahepatic bile duct: Secondary | ICD-10-CM | POA: Diagnosis not present

## 2019-08-16 DIAGNOSIS — C773 Secondary and unspecified malignant neoplasm of axilla and upper limb lymph nodes: Secondary | ICD-10-CM | POA: Diagnosis not present

## 2019-08-16 DIAGNOSIS — Z7189 Other specified counseling: Secondary | ICD-10-CM

## 2019-08-16 DIAGNOSIS — Z5112 Encounter for antineoplastic immunotherapy: Secondary | ICD-10-CM | POA: Diagnosis not present

## 2019-08-16 DIAGNOSIS — C184 Malignant neoplasm of transverse colon: Secondary | ICD-10-CM | POA: Diagnosis not present

## 2019-08-16 DIAGNOSIS — Z5111 Encounter for antineoplastic chemotherapy: Secondary | ICD-10-CM | POA: Diagnosis not present

## 2019-08-16 MED ORDER — HEPARIN SOD (PORK) LOCK FLUSH 100 UNIT/ML IV SOLN
500.0000 [IU] | Freq: Once | INTRAVENOUS | Status: AC | PRN
  Administered 2019-08-16: 500 [IU]
  Filled 2019-08-16: qty 5

## 2019-08-16 MED ORDER — SODIUM CHLORIDE 0.9% FLUSH
10.0000 mL | INTRAVENOUS | Status: DC | PRN
  Administered 2019-08-16: 10 mL
  Filled 2019-08-16: qty 10

## 2019-08-17 ENCOUNTER — Telehealth: Payer: Self-pay

## 2019-08-17 NOTE — Telephone Encounter (Signed)
Received medical clearance forms from [facility] for teeth extraction.  Forms completed, medication list attached and faxed to [phone]

## 2019-08-19 ENCOUNTER — Emergency Department (HOSPITAL_COMMUNITY)
Admission: EM | Admit: 2019-08-19 | Discharge: 2019-08-19 | Disposition: A | Discharge status: Home / Self Care | Payer: BC Managed Care – PPO | Attending: Emergency Medicine | Admitting: Emergency Medicine

## 2019-08-19 ENCOUNTER — Encounter (HOSPITAL_COMMUNITY): Payer: Self-pay | Admitting: Emergency Medicine

## 2019-08-19 ENCOUNTER — Other Ambulatory Visit: Payer: Self-pay

## 2019-08-19 DIAGNOSIS — K645 Perianal venous thrombosis: Secondary | ICD-10-CM | POA: Diagnosis not present

## 2019-08-19 DIAGNOSIS — Z79899 Other long term (current) drug therapy: Secondary | ICD-10-CM | POA: Diagnosis not present

## 2019-08-19 DIAGNOSIS — I10 Essential (primary) hypertension: Secondary | ICD-10-CM | POA: Diagnosis not present

## 2019-08-19 DIAGNOSIS — K6289 Other specified diseases of anus and rectum: Secondary | ICD-10-CM | POA: Diagnosis not present

## 2019-08-19 DIAGNOSIS — Z9221 Personal history of antineoplastic chemotherapy: Secondary | ICD-10-CM | POA: Insufficient documentation

## 2019-08-19 DIAGNOSIS — Z85038 Personal history of other malignant neoplasm of large intestine: Secondary | ICD-10-CM | POA: Insufficient documentation

## 2019-08-19 DIAGNOSIS — Z87891 Personal history of nicotine dependence: Secondary | ICD-10-CM | POA: Diagnosis not present

## 2019-08-19 LAB — CBC
HCT: 37 % (ref 36.0–46.0)
Hemoglobin: 12 g/dL (ref 12.0–15.0)
MCH: 30.6 pg (ref 26.0–34.0)
MCHC: 32.4 g/dL (ref 30.0–36.0)
MCV: 94.4 fL (ref 80.0–100.0)
Platelets: 164 10*3/uL (ref 150–400)
RBC: 3.92 MIL/uL (ref 3.87–5.11)
RDW: 16.3 % — ABNORMAL HIGH (ref 11.5–15.5)
WBC: 4.9 10*3/uL (ref 4.0–10.5)
nRBC: 0 % (ref 0.0–0.2)

## 2019-08-19 LAB — COMPREHENSIVE METABOLIC PANEL
ALT: 64 U/L — ABNORMAL HIGH (ref 0–44)
AST: 53 U/L — ABNORMAL HIGH (ref 15–41)
Albumin: 4.1 g/dL (ref 3.5–5.0)
Alkaline Phosphatase: 90 U/L (ref 38–126)
Anion gap: 10 (ref 5–15)
BUN: 30 mg/dL — ABNORMAL HIGH (ref 6–20)
CO2: 21 mmol/L — ABNORMAL LOW (ref 22–32)
Calcium: 10.2 mg/dL (ref 8.9–10.3)
Chloride: 102 mmol/L (ref 98–111)
Creatinine, Ser: 1.3 mg/dL — ABNORMAL HIGH (ref 0.44–1.00)
GFR calc Af Amer: 52 mL/min — ABNORMAL LOW (ref >60)
GFR calc non Af Amer: 45 mL/min — ABNORMAL LOW (ref >60)
Glucose, Bld: 102 mg/dL — ABNORMAL HIGH (ref 70–99)
Potassium: 4.9 mmol/L (ref 3.5–5.1)
Sodium: 133 mmol/L — ABNORMAL LOW (ref 135–145)
Total Bilirubin: 0.8 mg/dL (ref 0.3–1.2)
Total Protein: 8.4 g/dL — ABNORMAL HIGH (ref 6.5–8.1)

## 2019-08-19 LAB — LIPASE, BLOOD: Lipase: 19 U/L (ref 11–51)

## 2019-08-19 MED ORDER — OXYCODONE-ACETAMINOPHEN 5-325 MG PO TABS
1.0000 | ORAL_TABLET | Freq: Once | ORAL | Status: AC
  Administered 2019-08-19: 1 via ORAL
  Filled 2019-08-19: qty 1

## 2019-08-19 MED ORDER — SODIUM CHLORIDE 0.9% FLUSH: 3.0000 mL | Freq: Once | INTRAVENOUS | Status: DC

## 2019-08-19 MED ORDER — OXYCODONE-ACETAMINOPHEN 5-325 MG PO TABS: 1.0000 | ORAL_TABLET | ORAL | 0 refills | Status: DC | PRN

## 2019-08-19 MED ORDER — LIDOCAINE HCL URETHRAL/MUCOSAL 2 % EX GEL
1.0000 "application " | Freq: Once | CUTANEOUS | Status: AC
  Administered 2019-08-19: 1 via TOPICAL
  Filled 2019-08-19: qty 30

## 2019-08-19 NOTE — Discharge Instructions (Addendum)
I think you may be suffering from a thrombosed hemorrhoid, or hemorrhoid with a blood clot in it.  We put some numbing jelly on it in the ER.  I prescribed you some pain pills for several days.  I also advised that you buy a sitz bath over-the-counter at a [facility] or [facility].  You can soak your bottom in hot water 3-4 times a day, for 10 to 15 minutes at a time.  Finally, you can buy [employer] lidocaine cream, 4% strength, over the counter at most pharmacies.  Use this up to three times per day.  Do not use it for longer than 1 week.  Please call the [facility] center tomorrow to ask for a walk-in appointment at the colo-rectal clinic for hemorrhoid problems.  They usually have an ambulatory clinic during the walk.

## 2019-08-19 NOTE — ED Triage Notes (Signed)
Pt c/o abdominal pain and is concerned she has a hemorrhoid. Pt has a colostomy bag, hx colon cancer.

## 2019-08-19 NOTE — ED Provider Notes (Signed)
[facility] EMERGENCY DEPARTMENT Provider Note   CSN: [id number] Arrival date & time: 08/19/19  1711     History CC:  Rectal pain  Casey Black is a 59 y.o. female w/ hx of colon cancer s/p bowel resection with ostomy preseting to the ED with rectal pain.  She reports 3 days ago having onset of pain in her rectum - "it feels like something pushed out, and it hurts."  She thinks it might be a hemorrhoid.  The pain is localized to her rectum.  She denies n/v.  She continues putting out stool in her ostomy, and tells me she does not pass stool from her rectum.  No fevers, chills.  HPI     Past Medical History:  Diagnosis Date  . Anemia    low iron  . Cancer [facility])    colon cancer  . Colon cancer ([place]) 01/2019  . Hypertension 01/23/2019  . Personal history of chemotherapy 01/2019   colon CA  . SBO (small bowel obstruction) ([place]) 01/23/2019    Patient Active Problem List   Diagnosis Date Noted  . Trichomonas vaginalis infection 07/24/2019  . ASCUS of cervix with negative high risk HPV 07/24/2019  . Port-A-Cath in place 03/12/2019  . Goals of care, counseling/discussion 02/15/2019  . IDA (iron deficiency anemia) 02/02/2019  . 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 SBO (small bowel obstruction) ([place]) 01/23/2019  . Hypertension 01/23/2019  . Tobacco dependence 01/23/2019    Past Surgical History:  Procedure Laterality Date  . CESAREAN SECTION     x2  . COLOSTOMY N/A 01/24/2019   Procedure: End Loop Colostomy;  Surgeon: Clovis Riley, MD;  Location: [place];  Service: General;  Laterality: N/A;  . PARTIAL COLECTOMY N/A 01/24/2019   Procedure: PARTIAL COLECTOMY;  Surgeon: Clovis Riley, MD;  Location: [place];  Service: General;  Laterality: N/A;  . PORTACATH PLACEMENT Right 02/28/2019   Procedure: INSERTION PORT-A-CATH WITH ULTRASOUND GUIDANCE;  Surgeon: Clovis Riley, MD;  Location: [place];  Service: General;   Laterality: Right;     OB History   No obstetric history on file.     Family History  Problem Relation Age of Onset  . Kidney failure Father   . Hypertension Father   . Hypertension Sister     Social History   Tobacco Use  . Smoking status: Former Smoker    Packs/day: 0.50    Years: 29.00    Pack years: 14.50    Types: Cigarettes    Quit date: 01/04/2019    Years since quitting: 0.6  . Smokeless tobacco: Never Used  . Tobacco comment: desires patch  Substance Use Topics  . Alcohol use: Not Currently    Comment: a pint a week  . Drug use: Never    Home Medications Prior to Admission medications   Medication Sig Start Date End Date Taking? Authorizing Provider  acetaminophen (TYLENOL) 325 MG tablet Take 2 tablets (650 mg total) by mouth every 6 (six) hours as needed. 02/03/19   Debbe Odea, MD  amLODipine (NORVASC) 10 MG tablet TAKE 1 TABLET BY MOUTH EVERY DAY 08/08/19   Truitt Merle, MD  ferrous sulfate 325 (65 FE) MG tablet TAKE 1 TABLET (325 MG TOTAL) BY MOUTH 2 (TWO) TIMES DAILY WITH A MEAL. 05/31/19 05/30/20  [name], NP  hydrALAZINE (APRESOLINE) 25 MG tablet TAKE 1 TABLET (25 MG TOTAL) BY MOUTH EVERY 8 (EIGHT) HOURS. 08/07/19  [name], [name], MD  hydrochlorothiazide (HYDRODIURIL) 12.5 MG tablet Take 1 tablet (12.5 mg total) by mouth daily. 07/30/19   Truitt Merle, MD  Ibuprofen (ADVIL) 200 MG CAPS Take 400 mg by mouth daily as needed (pain).    [provider]  isosorbide mononitrate (IMDUR) 30 MG 24 hr tablet TAKE 1 TABLET BY MOUTH EVERY DAY 06/12/19   [name], [name], MD  lidocaine-prilocaine (EMLA) cream Apply to affected area once Patient taking differently: Apply 1 application topically once. Apply to affected area once 02/15/19   Truitt Merle, MD  lisinopril (ZESTRIL) 40 MG tablet TAKE 1 TABLET BY MOUTH EVERY DAY 05/08/19   [name], [name], MD  loperamide (IMODIUM) 2 MG capsule TAKE 1 CAPSULE (2 MG TOTAL) BY MOUTH AS NEEDED FOR  DIARRHEA OR LOOSE STOOLS. Patient not taking: Reported on 08/14/2019 07/16/19   Truitt Merle, MD  metoprolol tartrate (LOPRESSOR) 50 MG tablet TAKE 1 TABLET BY MOUTH TWICE A DAY 05/08/19   [name], [name], MD  metroNIDAZOLE (FLAGYL) 250 MG tablet Take 1 tablet (250 mg total) by mouth 3 (three) times daily. 07/25/19   [name], [name], MD  ondansetron (ZOFRAN) 4 MG tablet Take 1 tablet (4 mg total) by mouth every 6 (six) hours as needed for nausea. Patient not taking: Reported on 08/14/2019 02/03/19   Debbe Odea, MD  ondansetron (ZOFRAN) 8 MG tablet Take 1 tablet (8 mg total) by mouth 2 (two) times daily as needed for refractory nausea / vomiting. Start on day 3 after chemotherapy. Patient not taking: Reported on 08/14/2019 02/15/19   Truitt Merle, MD  oxyCODONE-acetaminophen (PERCOCET) 5-325 MG tablet Take 1 tablet by mouth every 4 (four) hours as needed for up to 5 days for severe pain. 08/19/19 08/24/19  Fransico Meadow, PA-C  polycarbophil (FIBERCON) 625 MG tablet Take 1 tablet (625 mg total) by mouth daily. 02/03/19   Debbe Odea, MD  prochlorperazine (COMPAZINE) 10 MG tablet Take 1 tablet (10 mg total) by mouth every 6 (six) hours as needed (Nausea or vomiting). 02/15/19   Truitt Merle, MD    Allergies    Patient has no known allergies.  Review of Systems   Review of Systems  Constitutional: Negative for chills and fever.  Respiratory: Negative for cough and shortness of breath.   Cardiovascular: Negative for chest pain and palpitations.  Gastrointestinal: Positive for rectal pain. Negative for abdominal pain and vomiting.  Skin: Negative for color change and rash.  Neurological: Negative for syncope and headaches.  Psychiatric/Behavioral: Negative for agitation and confusion.  All other systems reviewed and are negative.   Physical Exam Updated Vital Signs BP (!) 200/86   Pulse 78   Temp 98.8 F (37.1 C)   Resp 17   Ht 5\' 7"  (1.702 m)   Wt 86.3 kg   SpO2 98%   BMI  29.81 kg/m   Physical Exam Vitals and nursing note reviewed.  Constitutional:      General: She is in acute distress.     Appearance: She is well-developed.  HENT:     Head: Normocephalic and atraumatic.  Eyes:     Conjunctiva/sclera: Conjunctivae normal.  Cardiovascular:     Rate and Rhythm: Normal rate and regular rhythm.  Pulmonary:     Effort: Pulmonary effort is normal. No respiratory distress.     Breath sounds: Normal breath sounds.  Abdominal:     Palpations: Abdomen is soft.     Tenderness: There is  no abdominal tenderness.     Comments: Ostomy bag with normal stool output  Genitourinary:    Comments: Rectal exam performed with exquisitely tender externally-protruding hemorrhoid, no firm clot palpable Musculoskeletal:     Cervical back: Neck supple.  Skin:    General: Skin is warm and dry.  Neurological:     General: No focal deficit present.     Mental Status: She is alert and oriented to person, place, and time.     ED Results / Procedures / Treatments   Labs (all labs ordered are listed, but only abnormal results are displayed) Labs Reviewed  COMPREHENSIVE METABOLIC PANEL - Abnormal; Notable for the following components:      Result Value   Sodium 133 (*)    CO2 21 (*)    Glucose, Bld 102 (*)    BUN 30 (*)    Creatinine, Ser 1.30 (*)    Total Protein 8.4 (*)    AST 53 (*)    ALT 64 (*)    GFR calc non Af Amer 45 (*)    GFR calc Af Amer 52 (*)    All other components within normal limits  CBC - Abnormal; Notable for the following components:   RDW 16.3 (*)    All other components within normal limits  LIPASE, BLOOD  URINALYSIS, ROUTINE W REFLEX MICROSCOPIC    EKG None  Radiology No results found.  Procedures Procedures (including critical care time)  Medications Ordered in ED Medications  sodium chloride flush (NS) 0.9 % injection 3 mL (3 mLs Intravenous Not Given 08/19/19 1829)  oxyCODONE-acetaminophen (PERCOCET/ROXICET) 5-325 MG per  tablet 1 tablet (1 tablet Oral Given 08/19/19 2000)  lidocaine (XYLOCAINE) 2 % jelly 1 application (1 application Topical Given by Other 08/19/19 2032)    ED Course  I have reviewed the triage vital signs and the nursing notes.  Pertinent labs & imaging results that were available during my care of the patient were reviewed by me and considered in my medical decision making (see chart for details).  59 yo female here with painful externally-protruding hemorrhoid on exam for the past 3 days.  Her pain is localized to the rectum and this hemorrhoid is focally tender - I highly suspect this is the cause of her pain, and not another intraabdominal or urinary source.  It was difficult to tell whether this was a prolapsed internal hemorrhoid or a thrombosed external hemorrhoid.  We gave PO pain medications and topical lidocaine jelly, with a minimal amount of relief.  I can prescribe these for home, along with sitz baths, and recommended f/u in ambulatory colorectal clinic this week.  Her daughter was present and told me she would help with this.   Fortunately she does not pass stool via the rectum, and so painful BM should be less of an issue for her.   Final Clinical Impression(s) / ED Diagnoses Final diagnoses:  External thrombosed hemorrhoids    Rx / DC Orders ED Discharge Orders         Ordered    oxyCODONE-acetaminophen (PERCOCET) 5-325 MG tablet  Every 4 hours PRN     08/19/19 2051           Wyvonnia Dusky, MD 08/19/19 2320

## 2019-08-21 NOTE — Progress Notes (Signed)
Pharmacist Chemotherapy Monitoring - Follow Up Assessment    I verify that I have reviewed each item in the below checklist:  . Regimen for the patient is scheduled for the appropriate day and plan matches scheduled date. Marland Kitchen Appropriate non-routine labs are ordered dependent on drug ordered. . If applicable, additional medications reviewed and ordered per protocol based on lifetime cumulative doses and/or treatment regimen.   Plan for follow-up and/or issues identified: No . I-vent associated with next due treatment: No . MD and/or nursing notified: No  Casey Black 08/21/2019 11:24 AM

## 2019-08-23 ENCOUNTER — Emergency Department (HOSPITAL_COMMUNITY)
Admission: RE | Admit: 2019-08-23 | Discharge: 2019-08-23 | Disposition: A | Discharge status: Home / Self Care | Payer: BC Managed Care – PPO | Source: Ambulatory Visit | Attending: Hematology | Admitting: Hematology

## 2019-08-23 ENCOUNTER — Encounter (HOSPITAL_COMMUNITY): Payer: Self-pay | Admitting: Emergency Medicine

## 2019-08-23 ENCOUNTER — Other Ambulatory Visit: Payer: Self-pay

## 2019-08-23 ENCOUNTER — Emergency Department (HOSPITAL_COMMUNITY)
Admission: EM | Admit: 2019-08-23 | Discharge: 2019-08-23 | Disposition: A | Discharge status: Home / Self Care | Payer: BC Managed Care – PPO | Attending: Emergency Medicine | Admitting: Emergency Medicine

## 2019-08-23 ENCOUNTER — Other Ambulatory Visit: Payer: Self-pay | Admitting: Hematology

## 2019-08-23 ENCOUNTER — Encounter (HOSPITAL_COMMUNITY): Payer: Self-pay

## 2019-08-23 DIAGNOSIS — Z87891 Personal history of nicotine dependence: Secondary | ICD-10-CM | POA: Diagnosis not present

## 2019-08-23 DIAGNOSIS — K644 Residual hemorrhoidal skin tags: Secondary | ICD-10-CM | POA: Insufficient documentation

## 2019-08-23 DIAGNOSIS — C7801 Secondary malignant neoplasm of right lung: Secondary | ICD-10-CM | POA: Diagnosis not present

## 2019-08-23 DIAGNOSIS — Z79899 Other long term (current) drug therapy: Secondary | ICD-10-CM | POA: Insufficient documentation

## 2019-08-23 DIAGNOSIS — C189 Malignant neoplasm of colon, unspecified: Secondary | ICD-10-CM

## 2019-08-23 DIAGNOSIS — I10 Essential (primary) hypertension: Secondary | ICD-10-CM | POA: Diagnosis not present

## 2019-08-23 DIAGNOSIS — K6289 Other specified diseases of anus and rectum: Secondary | ICD-10-CM

## 2019-08-23 DIAGNOSIS — C787 Secondary malignant neoplasm of liver and intrahepatic bile duct: Secondary | ICD-10-CM | POA: Insufficient documentation

## 2019-08-23 DIAGNOSIS — J432 Centrilobular emphysema: Secondary | ICD-10-CM | POA: Diagnosis not present

## 2019-08-23 DIAGNOSIS — Z85038 Personal history of other malignant neoplasm of large intestine: Secondary | ICD-10-CM | POA: Diagnosis not present

## 2019-08-23 MED ORDER — HYDROCORTISONE (PERIANAL) 2.5 % EX CREA: 1.0000 "application " | TOPICAL_CREAM | Freq: Two times a day (BID) | CUTANEOUS | 0 refills | Status: DC

## 2019-08-23 MED ORDER — SODIUM CHLORIDE (PF) 0.9 % IJ SOLN
INTRAMUSCULAR | Status: AC
  Filled 2019-08-23: qty 50

## 2019-08-23 MED ORDER — OXYCODONE-ACETAMINOPHEN 5-325 MG PO TABS: 1.0000 | ORAL_TABLET | Freq: Four times a day (QID) | ORAL | 0 refills | Status: DC | PRN

## 2019-08-23 MED ORDER — HEPARIN SOD (PORK) LOCK FLUSH 100 UNIT/ML IV SOLN
500.0000 [IU] | Freq: Once | INTRAVENOUS | Status: AC
  Administered 2019-08-23: 500 [IU] via INTRAVENOUS

## 2019-08-23 MED ORDER — HEPARIN SOD (PORK) LOCK FLUSH 100 UNIT/ML IV SOLN
INTRAVENOUS | Status: AC
  Filled 2019-08-23: qty 5

## 2019-08-23 MED ORDER — IOHEXOL 300 MG/ML  SOLN
100.0000 mL | Freq: Once | INTRAMUSCULAR | Status: AC | PRN
  Administered 2019-08-23: 100 mL via INTRAVENOUS

## 2019-08-23 MED ORDER — OXYCODONE-ACETAMINOPHEN 5-325 MG PO TABS
2.0000 | ORAL_TABLET | Freq: Once | ORAL | Status: AC
  Administered 2019-08-23: 2 via ORAL
  Filled 2019-08-23: qty 2

## 2019-08-23 MED ORDER — LIDOCAINE 4 % EX CREA
1.0000 | TOPICAL_CREAM | Freq: Three times a day (TID) | CUTANEOUS | 0 refills | Status: DC | PRN
Start: 2019-08-23 — End: 2024-02-12

## 2019-08-23 NOTE — ED Triage Notes (Signed)
Pt reports seen on 5/16 for hemorrhoid and was told to use [place], Sitz bath, pain meds but not helping with her rectal pain. Having bleeding since as well.

## 2019-08-23 NOTE — ED Provider Notes (Signed)
[facility] DEPT Provider Note   CSN: [id number] Arrival date & time: 08/23/19  1714     History Chief Complaint  Patient presents with  . Rectal Pain    Casey Black is a 59 y.o. female.  HPI      Casey Black is a 59 y.o. female, with a history of colon cancer, colectomy, colostomy, HTN, presenting to the ED with rectal pain beginning around May 16. She states she noted some pain and swelling to the region. She was evaluated in the emergency department and diagnosed with hemorrhoid.  She was prescribed pain medication and advised on home management. She states she has called to make an appointment with a general surgeon, but has not received a call back.  Patient underwent partial colectomy with colostomy placement October 2020 as part of her treatment for colon cancer.  She is also undergoing chemotherapy every 2 weeks.  She has not had any difficulties with colostomy output.  Denies fever/chills, nausea/vomiting, bloody stool, abdominal pain, difficulty urinating, vaginal discomfort, or any other complaints.  Past Medical History:  Diagnosis Date  . Anemia    low iron  . Cancer [facility])    colon cancer  . Colon cancer ([place]) 01/2019  . Hypertension 01/23/2019  . Personal history of chemotherapy 01/2019   colon CA  . SBO (small bowel obstruction) ([place]) 01/23/2019    Patient Active Problem List   Diagnosis Date Noted  . Trichomonas vaginalis infection 07/24/2019  . ASCUS of cervix with negative high risk HPV 07/24/2019  . Port-A-Cath in place 03/12/2019  . Goals of care, counseling/discussion 02/15/2019  . IDA (iron deficiency anemia) 02/02/2019  . Adenocarcinoma of colon metastatic to liver ([place]) 02/02/2019  . SBO (small bowel obstruction) ([place]) 01/23/2019  . Hypertension 01/23/2019  . Tobacco dependence 01/23/2019    Past Surgical History:  Procedure Laterality Date  . CESAREAN SECTION     x2  . COLOSTOMY N/A  01/24/2019   Procedure: End Loop Colostomy;  Surgeon: Clovis Riley, MD;  Location: [place];  Service: General;  Laterality: N/A;  . PARTIAL COLECTOMY N/A 01/24/2019   Procedure: PARTIAL COLECTOMY;  Surgeon: Clovis Riley, MD;  Location: [place];  Service: General;  Laterality: N/A;  . PORTACATH PLACEMENT Right 02/28/2019   Procedure: INSERTION PORT-A-CATH WITH ULTRASOUND GUIDANCE;  Surgeon: Clovis Riley, MD;  Location: [place];  Service: General;  Laterality: Right;     OB History   No obstetric history on file.     Family History  Problem Relation Age of Onset  . Kidney failure Father   . Hypertension Father   . Hypertension Sister     Social History   Tobacco Use  . Smoking status: Former Smoker    Packs/day: 0.50    Years: 29.00    Pack years: 14.50    Types: Cigarettes    Quit date: 01/04/2019    Years since quitting: 0.6  . Smokeless tobacco: Never Used  . Tobacco comment: desires patch  Substance Use Topics  . Alcohol use: Not Currently    Comment: a pint a week  . Drug use: Never    Home Medications Prior to Admission medications   Medication Sig Start Date End Date Taking? Authorizing Provider  acetaminophen (TYLENOL) 325 MG tablet Take 2 tablets (650 mg total) by mouth every 6 (six) hours as needed. 02/03/19   Debbe Odea, MD  amLODipine (NORVASC) 10 MG tablet TAKE 1 TABLET BY MOUTH  EVERY DAY 08/08/19   Truitt Merle, MD  ferrous sulfate 325 (65 FE) MG tablet TAKE 1 TABLET (325 MG TOTAL) BY MOUTH 2 (TWO) TIMES DAILY WITH A MEAL. 05/31/19 05/30/20  [name], NP  hydrALAZINE (APRESOLINE) 25 MG tablet TAKE 1 TABLET (25 MG TOTAL) BY MOUTH EVERY 8 (EIGHT) HOURS. 08/07/19   [name], [name], MD  hydrochlorothiazide (HYDRODIURIL) 12.5 MG tablet Take 1 tablet (12.5 mg total) by mouth daily. 07/30/19   Truitt Merle, MD  hydrocortisone (ANUSOL-HC) 2.5 % rectal cream Place 1 application rectally 2 (two) times daily. 08/23/19   Maverik Foot C, PA-C  Ibuprofen  (ADVIL) 200 MG CAPS Take 400 mg by mouth daily as needed (pain).    [provider]  isosorbide mononitrate (IMDUR) 30 MG 24 hr tablet TAKE 1 TABLET BY MOUTH EVERY DAY 06/12/19   [name], [name], MD  lidocaine (LMX) 4 % cream Apply 1 application topically 3 (three) times daily as needed. 08/23/19   Tona Qualley C, PA-C  lidocaine-prilocaine (EMLA) cream Apply to affected area once Patient taking differently: Apply 1 application topically once. Apply to affected area once 02/15/19   Truitt Merle, MD  lisinopril (ZESTRIL) 40 MG tablet TAKE 1 TABLET BY MOUTH EVERY DAY 05/08/19   [name], [name], MD  loperamide (IMODIUM) 2 MG capsule TAKE 1 CAPSULE (2 MG TOTAL) BY MOUTH AS NEEDED FOR DIARRHEA OR LOOSE STOOLS. 07/16/19   Truitt Merle, MD  metoprolol tartrate (LOPRESSOR) 50 MG tablet TAKE 1 TABLET BY MOUTH TWICE A DAY 05/08/19   [name], [name], MD  metroNIDAZOLE (FLAGYL) 250 MG tablet Take 1 tablet (250 mg total) by mouth 3 (three) times daily. 07/25/19   [name], [name], MD  ondansetron (ZOFRAN) 4 MG tablet Take 1 tablet (4 mg total) by mouth every 6 (six) hours as needed for nausea. 02/03/19   Debbe Odea, MD  ondansetron (ZOFRAN) 8 MG tablet Take 1 tablet (8 mg total) by mouth 2 (two) times daily as needed for refractory nausea / vomiting. Start on day 3 after chemotherapy. 02/15/19   Truitt Merle, MD  oxyCODONE-acetaminophen (PERCOCET/ROXICET) 5-325 MG tablet Take 1-2 tablets by mouth every 6 (six) hours as needed for severe pain. 08/23/19   Yonathan Perrow C, PA-C  polycarbophil (FIBERCON) 625 MG tablet Take 1 tablet (625 mg total) by mouth daily. 02/03/19   Debbe Odea, MD  prochlorperazine (COMPAZINE) 10 MG tablet Take 1 tablet (10 mg total) by mouth every 6 (six) hours as needed (Nausea or vomiting). 02/15/19   Truitt Merle, MD    Allergies    Patient has no known allergies.  Review of Systems   Review of Systems  Constitutional: Negative for chills and fever.    Cardiovascular: Negative for chest pain.  Gastrointestinal: Positive for rectal pain. Negative for abdominal distention, abdominal pain, blood in stool, nausea and vomiting.  Genitourinary: Negative for difficulty urinating, vaginal bleeding, vaginal discharge and vaginal pain.  Neurological: Negative for weakness.  All other systems reviewed and are negative.   Physical Exam Updated Vital Signs BP 105/62   Pulse 67   Temp 98.4 F (36.9 C) (Oral)   Resp 18   SpO2 100%   Physical Exam Vitals and nursing note reviewed.  Constitutional:      General: She is not in acute distress.    Appearance: She is well-developed. She is not diaphoretic.  HENT:     Head: Normocephalic and atraumatic.  Mouth/Throat:     Mouth: Mucous membranes are moist.     Pharynx: Oropharynx is clear.  Eyes:     Conjunctiva/sclera: Conjunctivae normal.  Cardiovascular:     Rate and Rhythm: Normal rate and regular rhythm.     Pulses: Normal pulses.          Radial pulses are 2+ on the right side and 2+ on the left side.  Pulmonary:     Effort: Pulmonary effort is normal. No respiratory distress.  Abdominal:     Palpations: Abdomen is soft.     Tenderness: There is no abdominal tenderness. There is no guarding.     Comments: Normal-appearing stool present in colostomy bag.  Genitourinary:    Comments: What appears to be large hemorrhoid approximately 4 to 5 cm long and 2 cm wide.  Soft, but quite tender to the touch.  Small amount of scant blood, but no noted active hemorrhage. Musculoskeletal:     Cervical back: Neck supple.  Lymphadenopathy:     Cervical: No cervical adenopathy.  Skin:    General: Skin is warm and dry.  Neurological:     Mental Status: She is alert.  Psychiatric:        Mood and Affect: Mood and affect normal.        Speech: Speech normal.        Behavior: Behavior normal.     ED Results / Procedures / Treatments   Labs (all labs ordered are listed, but only abnormal  results are displayed) Labs Reviewed - No data to display  EKG None  Radiology CT Chest W Contrast  Result Date: 08/23/2019 CLINICAL DATA:  Metastatic colon cancer. Partial colectomy with colostomy. Ongoing chemotherapy. Restaging. EXAM: CT CHEST, ABDOMEN, AND PELVIS WITH CONTRAST TECHNIQUE: Multidetector CT imaging of the chest, abdomen and pelvis was performed following the standard protocol during bolus administration of intravenous contrast. CONTRAST:  148mL OMNIPAQUE IOHEXOL 300 MG/ML  SOLN COMPARISON:  05/31/2019 CT chest, abdomen and pelvis. FINDINGS: CT CHEST FINDINGS Cardiovascular: Top-normal heart size. No significant pericardial effusion/thickening. Three-vessel coronary atherosclerosis. Right internal jugular Port-A-Cath terminates at the cavoatrial junction. Atherosclerotic nonaneurysmal thoracic aorta. Normal caliber main pulmonary artery. No central pulmonary emboli. Mediastinum/Nodes: No discrete thyroid nodules. Unremarkable esophagus. No pathologically enlarged axillary, mediastinal or hilar lymph nodes. Lungs/Pleura: No pneumothorax. No pleural effusion. Mild centrilobular and paraseptal emphysema with mild diffuse bronchial wall thickening. Tiny 2 mm apical right upper lobe pulmonary nodule (series 6/image 34) is stable and probably benign. No acute consolidative airspace disease, lung masses or new significant pulmonary nodules. Musculoskeletal: No aggressive appearing focal osseous lesions. Mild thoracic spondylosis. CT ABDOMEN PELVIS FINDINGS Hepatobiliary: There are least 5 heterogeneous partially calcified liver masses scattered throughout the liver, stable to mildly increased. Representative peripheral right liver lobe 3.1 x 2.5 cm mass (series 2/image 52), previously 2.7 x 2.4 cm using similar measurement technique, mildly increased. Representative 1.6 x 1.6 cm peripheral right liver mass (series 2/image 53), previously 1.6 x 1.5 cm, stable. Representative left liver dome 1.3 x  1.0 cm mass (series 2/image 47), previously 1.3 x 1.1 cm, stable. No new liver masses. Normal gallbladder with no radiopaque cholelithiasis. No biliary ductal dilatation. Pancreas: Normal, with no mass or duct dilation. Spleen: Normal size. No mass. Adrenals/Urinary Tract: Normal adrenals. Subcentimeter hypodense interpolar right renal cortical lesion is too small to characterize and is unchanged, considered benign. Otherwise normal kidneys, with no hydronephrosis. Normal bladder. Stomach/Bowel: Normal non-distended stomach. Normal caliber small bowel with  no small bowel wall thickening. Normal appendix. Oral contrast transits to the right colon. Stable postsurgical changes from partial colectomy with right-sided end colostomy. No wall thickening or acute pericolonic fat stranding in the remnant large-bowel. Vascular/Lymphatic: Mildly atherosclerotic nonaneurysmal abdominal aorta. Patent portal, splenic, hepatic and renal veins. No pathologically enlarged lymph nodes in the abdomen or pelvis. Reproductive: Stable mildly enlarged myomatous uterus. No adnexal masses. Other: No pneumoperitoneum, ascites or focal fluid collection. Musculoskeletal: No aggressive appearing focal osseous lesions. Moderate lumbar spondylosis. IMPRESSION: 1. The dominant peripheral right liver metastasis has mildly increased. Other smaller liver metastases are stable. 2. Otherwise no new or progressive metastatic disease in the chest, abdomen or pelvis. 3. Aortic Atherosclerosis (ICD10-I70.0) and Emphysema (ICD10-J43.9). Electronically Signed   By: Ilona Sorrel M.D.   On: 08/23/2019 10:39   CT Abdomen Pelvis W Contrast  Result Date: 08/23/2019 CLINICAL DATA:  Metastatic colon cancer. Partial colectomy with colostomy. Ongoing chemotherapy. Restaging. EXAM: CT CHEST, ABDOMEN, AND PELVIS WITH CONTRAST TECHNIQUE: Multidetector CT imaging of the chest, abdomen and pelvis was performed following the standard protocol during bolus  administration of intravenous contrast. CONTRAST:  12mL OMNIPAQUE IOHEXOL 300 MG/ML  SOLN COMPARISON:  05/31/2019 CT chest, abdomen and pelvis. FINDINGS: CT CHEST FINDINGS Cardiovascular: Top-normal heart size. No significant pericardial effusion/thickening. Three-vessel coronary atherosclerosis. Right internal jugular Port-A-Cath terminates at the cavoatrial junction. Atherosclerotic nonaneurysmal thoracic aorta. Normal caliber main pulmonary artery. No central pulmonary emboli. Mediastinum/Nodes: No discrete thyroid nodules. Unremarkable esophagus. No pathologically enlarged axillary, mediastinal or hilar lymph nodes. Lungs/Pleura: No pneumothorax. No pleural effusion. Mild centrilobular and paraseptal emphysema with mild diffuse bronchial wall thickening. Tiny 2 mm apical right upper lobe pulmonary nodule (series 6/image 34) is stable and probably benign. No acute consolidative airspace disease, lung masses or new significant pulmonary nodules. Musculoskeletal: No aggressive appearing focal osseous lesions. Mild thoracic spondylosis. CT ABDOMEN PELVIS FINDINGS Hepatobiliary: There are least 5 heterogeneous partially calcified liver masses scattered throughout the liver, stable to mildly increased. Representative peripheral right liver lobe 3.1 x 2.5 cm mass (series 2/image 52), previously 2.7 x 2.4 cm using similar measurement technique, mildly increased. Representative 1.6 x 1.6 cm peripheral right liver mass (series 2/image 53), previously 1.6 x 1.5 cm, stable. Representative left liver dome 1.3 x 1.0 cm mass (series 2/image 47), previously 1.3 x 1.1 cm, stable. No new liver masses. Normal gallbladder with no radiopaque cholelithiasis. No biliary ductal dilatation. Pancreas: Normal, with no mass or duct dilation. Spleen: Normal size. No mass. Adrenals/Urinary Tract: Normal adrenals. Subcentimeter hypodense interpolar right renal cortical lesion is too small to characterize and is unchanged, considered benign.  Otherwise normal kidneys, with no hydronephrosis. Normal bladder. Stomach/Bowel: Normal non-distended stomach. Normal caliber small bowel with no small bowel wall thickening. Normal appendix. Oral contrast transits to the right colon. Stable postsurgical changes from partial colectomy with right-sided end colostomy. No wall thickening or acute pericolonic fat stranding in the remnant large-bowel. Vascular/Lymphatic: Mildly atherosclerotic nonaneurysmal abdominal aorta. Patent portal, splenic, hepatic and renal veins. No pathologically enlarged lymph nodes in the abdomen or pelvis. Reproductive: Stable mildly enlarged myomatous uterus. No adnexal masses. Other: No pneumoperitoneum, ascites or focal fluid collection. Musculoskeletal: No aggressive appearing focal osseous lesions. Moderate lumbar spondylosis. IMPRESSION: 1. The dominant peripheral right liver metastasis has mildly increased. Other smaller liver metastases are stable. 2. Otherwise no new or progressive metastatic disease in the chest, abdomen or pelvis. 3. Aortic Atherosclerosis (ICD10-I70.0) and Emphysema (ICD10-J43.9). Electronically Signed   By: Janina Mayo.D.  On: 08/23/2019 10:39    Procedures Procedures (including critical care time)  Medications Ordered in ED Medications  oxyCODONE-acetaminophen (PERCOCET/ROXICET) 5-325 MG per tablet 2 tablet (2 tablets Oral Given 08/23/19 2022)    ED Course  I have reviewed the triage vital signs and the nursing notes.  Pertinent labs & imaging results that were available during my care of the patient were reviewed by me and considered in my medical decision making (see chart for details).  Clinical Course as of Aug 23 924  Thu Aug 23, 2019  2016 Spoke with Dr. Jobe Igo, radiologist.  I asked him to review the CT of the abdomen/pelvis with me, specifically looking at the rectal area.  He states this area is notoriously difficult to spot abnormalities by CT, but from what he can see, he does  not spot anything to suggest mass in this region.   [SJ]    Clinical Course User Index [SJ] Keali Mccraw, Helane Gunther, PA-C   MDM Rules/Calculators/A&P                      Patient presents with rectal pain.  She has a recently diagnosed hemorrhoid.   Her physical exam today is again consistent with this diagnosis.  She has already contacted her surgeon's office and is awaiting a return call. The patient was given instructions for home care as well as return precautions. Patient voices understanding of these instructions, accepts the plan, and is comfortable with discharge.  I reviewed and interpreted the patient's radiological studies.  Findings and plan of care discussed with Delphina Cahill, MD. Dr. Alvino Chapel personally evaluated and examined this patient.  Final Clinical Impression(s) / ED Diagnoses Final diagnoses:  External hemorrhoid  Rectal pain    Rx / DC Orders ED Discharge Orders         Ordered    hydrocortisone (ANUSOL-HC) 2.5 % rectal cream  2 times daily     08/23/19 2025    lidocaine (LMX) 4 % cream  3 times daily PRN     08/23/19 2025    oxyCODONE-acetaminophen (PERCOCET/ROXICET) 5-325 MG tablet  Every 6 hours PRN     08/23/19 2030           Lorayne Bender, PA-C 08/24/19 0926    Davonna Belling, MD 08/25/19 [phone]

## 2019-08-23 NOTE — Discharge Instructions (Signed)
Apply the hydrocortisone rectal cream twice daily to reduce swelling and inflammation. May use the lidocaine cream up to 3 times daily to reduce pain.  Acetaminophen: May take acetaminophen (generic for Tylenol), as needed, for pain. Your daily total maximum amount of acetaminophen from all sources should be limited to 4000mg /day for persons without liver problems, or 2000mg /day for those with liver problems. Percocet: May take Percocet (oxycodone-acetaminophen) as needed for severe pain.   Do not drive or perform other dangerous activities while taking this medication as it can cause drowsiness as well as changes in reaction time and judgement.  Please note that each pill of Percocet contains 325 mg of acetaminophen (generic for Tylenol) and the above dosage limits apply.  Follow-up with the general surgeon on this matter.

## 2019-08-24 ENCOUNTER — Ambulatory Visit (INDEPENDENT_AMBULATORY_CARE_PROVIDER_SITE_OTHER): Payer: BC Managed Care – PPO | Admitting: Internal Medicine

## 2019-08-24 ENCOUNTER — Encounter: Payer: Self-pay | Admitting: Internal Medicine

## 2019-08-24 VITALS — BP 150/90 | HR 56 | Temp 97.2°F | Wt 184.4 lb

## 2019-08-24 DIAGNOSIS — K649 Unspecified hemorrhoids: Secondary | ICD-10-CM

## 2019-08-24 DIAGNOSIS — I10 Essential (primary) hypertension: Secondary | ICD-10-CM | POA: Diagnosis not present

## 2019-08-24 DIAGNOSIS — C787 Secondary malignant neoplasm of liver and intrahepatic bile duct: Secondary | ICD-10-CM | POA: Diagnosis not present

## 2019-08-24 DIAGNOSIS — C189 Malignant neoplasm of colon, unspecified: Secondary | ICD-10-CM

## 2019-08-24 NOTE — Progress Notes (Signed)
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 Bowel-gas pattern consistent with small bowel obstruction. No free air. 2. No acute chest findings.   01/23/2019 Imaging   CT AP IMPRESSION: Obstructing mid transverse colonic mass with mild regional adenopathy and hepatic metastatic disease. The mass likely extends through the serosa; no ascites or peritoneal nodularity.   01/24/2019 Surgery   Surgeon: Clovis Riley MD Assistant: [name] Procedure performed: Transverse colectomy with end colostomy, liver biopsy Procedure classification: URGENT/EMERGENT Preop diagnosis: Obstructing, metastatic transverse colon mass Post-op diagnosis/intraop findings: Same   01/24/2019 Pathology Results   FINAL MICROSCOPIC DIAGNOSIS:   A. COLON, TRANSVERSE, RESECTION:  Colonic adenocarcinoma, 5 cm.  Carcinoma extends into pericolonic connective tissue and focally to  serosal surface.  Margins not involved.  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   B. LIVER NODULE, LEFT, BIOPSY:  Metastatic adenocarcinoma.    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 Hypermetabolic metastatic disease in the liver and mediastinal/hilar/axillary lymph nodes. 2. Focal hypermetabolism in the rectum. Continued attention on follow-up exams is warranted. 3. Focal hypermetabolism medial to the right adrenal gland may be within a metastatic lymph node, better visualized on 01/23/2019. 4. Aortic atherosclerosis (ICD10-170.0). Coronary artery calcification.   03/12/2019 -  Chemotherapy   She started 5FU q2weeks on 03/12/19 for 2 cycles. She started full dose FOLFOX with Avastin on 04/09/19.   05/31/2019 Imaging   Restaging CT CAP IMPRESSION: 1. Similar to mild interval decrease in size of multiple hepatic lesions, partially calcified. 2. 2 mm right upper lobe pulmonary nodule. Recommend attention on follow-up. 3. Emphysema and aortic atherosclerosis.   08/23/2019 Imaging   CT CAP w contrast  IMPRESSION: 1. The dominant peripheral right liver metastasis has mildly increased. Other smaller liver metastases are stable. 2. Otherwise no new or progressive metastatic disease in the chest, abdomen or pelvis. 3. Aortic Atherosclerosis (ICD10-I70.0) and Emphysema (ICD10-J43.9).      CURRENT THERAPY:  She started 5FU q2weeks on 03/12/19 for 2 cycles. She started full dose FOLFOX with Avastin on 04/09/19.  INTERVAL HISTORY:  Deserai Cansler is here for a follow up and treatment. She presents to the clinic alone. She notes due to pain with BM due to hemorrhoids she went to ED on 5/16 and 5/20. She was given percocet but did not tolerate due to drowsiness. She feels better this morning with improved pain. She did have rectal bleeding from her hemorrhoids. She notes  she plans to f/u with Dr Conners soon given she plans to pay on  her medical bills. She notes she is constipated and had no stool output in her colostomy. She has been taking Miralax and is starting to have output now.  She notes she is tolerating chemo well. She has mild tingling of her fingers only. She denies any sensory deficits.    REVIEW OF SYSTEMS:   Constitutional: Denies fevers, chills or abnormal weight loss Eyes: Denies blurriness of vision Ears, nose, mouth, throat, and face: Denies mucositis or sore throat Respiratory: Denies cough, dyspnea or wheezes Cardiovascular: Denies palpitation, chest discomfort or lower extremity swelling Gastrointestinal:  Denies nausea, heartburn or change in bowel habits (+) rectal pain, hemorrhoids (+) Constipation Skin: Denies abnormal skin rashes (+) Skin darkening of hands  Lymphatics: Denies new lymphadenopathy or easy bruising Neurological: (+) Mild tingling of fingers Behavioral/Psych: Mood is stable, no new changes  All other systems were reviewed with the patient and are negative.  MEDICAL HISTORY:  Past Medical History:  Diagnosis Date   Anemia    low iron   Cancer (HCC)    colon cancer   Colon cancer ([place]) 01/2019   Hypertension 01/23/2019   Personal history of chemotherapy 01/2019   colon CA   SBO (small bowel obstruction) ([place]) 01/23/2019    SURGICAL HISTORY: Past Surgical History:  Procedure Laterality Date   CESAREAN SECTION     x2   COLOSTOMY N/A 01/24/2019   Procedure: End Loop Colostomy;  Surgeon: Clovis Riley, MD;  Location: [place] [facility];  Service: General;  Laterality: N/A;   PARTIAL COLECTOMY N/A 01/24/2019   Procedure: PARTIAL COLECTOMY;  Surgeon: Clovis Riley, MD;  Location: [place] OR;  Service: General;  Laterality: N/A;   PORTACATH PLACEMENT Right 02/28/2019   Procedure: INSERTION PORT-A-CATH WITH ULTRASOUND GUIDANCE;  Surgeon: Clovis Riley, MD;  Location: [place];  Service: General;  Laterality: Right;    I have reviewed the social history and family history  with the patient and they are unchanged from previous note.  ALLERGIES:  has No Known Allergies.  MEDICATIONS:  Current Outpatient Medications  Medication Sig Dispense Refill   acetaminophen (TYLENOL) 325 MG tablet Take 2 tablets (650 mg total) by mouth every 6 (six) hours as needed.     amLODipine (NORVASC) 10 MG tablet TAKE 1 TABLET BY MOUTH EVERY DAY 30 tablet 1   ferrous sulfate 325 (65 FE) MG tablet TAKE 1 TABLET (325 MG TOTAL) BY MOUTH 2 (TWO) TIMES DAILY WITH A MEAL. 180 tablet 1   hydrALAZINE (APRESOLINE) 25 MG tablet TAKE 1 TABLET (25 MG TOTAL) BY MOUTH EVERY 8 (EIGHT) HOURS. 90 tablet 1   hydrochlorothiazide (HYDRODIURIL) 12.5 MG tablet TAKE 1 TABLET BY MOUTH EVERY DAY 30 tablet 1   hydrocortisone (ANUSOL-HC) 2.5 % rectal cream Place 1 application rectally 2 (two) times daily. 30 g 0   Ibuprofen (ADVIL) 200 MG CAPS Take 400 mg by mouth daily as needed (pain).     isosorbide mononitrate (IMDUR) 30 MG 24 hr tablet TAKE 1 TABLET BY MOUTH EVERY DAY 90 tablet 0   lidocaine (LMX) 4 % cream Apply 1 application topically 3 (three) times daily as needed. 30 g 0   lidocaine-prilocaine (EMLA) cream Apply to affected area once (Patient taking differently: Apply 1 application topically once. Apply to affected area once) 30 g 3   lisinopril (ZESTRIL) 40 MG tablet TAKE 1 TABLET BY MOUTH EVERY DAY 90 tablet 1  loperamide (IMODIUM) 2 MG capsule TAKE 1 CAPSULE (2 MG TOTAL) BY MOUTH AS NEEDED FOR DIARRHEA OR LOOSE STOOLS. 90 capsule 0   metoprolol tartrate (LOPRESSOR) 50 MG tablet TAKE 1 TABLET BY MOUTH TWICE A DAY 180 tablet 1   metroNIDAZOLE (FLAGYL) 250 MG tablet Take 1 tablet (250 mg total) by mouth 3 (three) times daily. 1 tablet 0   ondansetron (ZOFRAN) 4 MG tablet Take 1 tablet (4 mg total) by mouth every 6 (six) hours as needed for nausea. 30 tablet 0   ondansetron (ZOFRAN) 8 MG tablet Take 1 tablet (8 mg total) by mouth 2 (two) times daily as needed for refractory nausea /  vomiting. Start on day 3 after chemotherapy. 30 tablet 1   oxyCODONE-acetaminophen (PERCOCET/ROXICET) 5-325 MG tablet Take 1-2 tablets by mouth every 6 (six) hours as needed for severe pain. 20 tablet 0   polycarbophil (FIBERCON) 625 MG tablet Take 1 tablet (625 mg total) by mouth daily. 30 tablet 0   prochlorperazine (COMPAZINE) 10 MG tablet Take 1 tablet (10 mg total) by mouth every 6 (six) hours as needed (Nausea or vomiting). 30 tablet 1   No current facility-administered medications for this visit.    PHYSICAL EXAMINATION: ECOG PERFORMANCE STATUS: 1 - Symptomatic but completely ambulatory  Vitals:   08/27/19 0906  BP: 136/62  Pulse: (!) 54  Resp: 18  Temp: 97.8 F (36.6 C)  SpO2: 100%   Filed Weights   08/27/19 0906  Weight: 184 lb 6.4 oz (83.6 kg)    Due to COVID19 we will limit examination to appearance. Patient had no complaints.  GENERAL:alert, no distress and comfortable SKIN: skin color normal, no rashes or significant lesions EYES: normal, Conjunctiva are pink and non-injected, sclera clear  NEURO: alert & oriented x 3 with fluent speech   LABORATORY DATA:  I have reviewed the data as listed CBC Latest Ref Rng & Units 08/27/2019 08/19/2019 08/14/2019  WBC 4.0 - 10.5 K/uL 5.6 4.9 4.0  Hemoglobin 12.0 - 15.0 g/dL 9.7(L) 12.0 10.3(L)  Hematocrit 36.0 - 46.0 % 30.2(L) 37.0 33.2(L)  Platelets 150 - 400 K/uL 159 164 120(L)     CMP Latest Ref Rng & Units 08/27/2019 08/19/2019 08/14/2019  Glucose 70 - 99 mg/dL 92 102(H) 90  BUN 6 - 20 mg/dL 26(H) 30(H) 27(H)  Creatinine 0.44 - 1.00 mg/dL 1.53(H) 1.30(H) 0.95  Sodium 135 - 145 mmol/L 135 133(L) 140  Potassium 3.5 - 5.1 mmol/L 4.4 4.9 4.3  Chloride 98 - 111 mmol/L 104 102 112(H)  CO2 22 - 32 mmol/L 22 21(L) 20(L)  Calcium 8.9 - 10.3 mg/dL 9.9 10.2 9.5  Total Protein 6.5 - 8.1 g/dL 7.6 8.4(H) 7.3  Total Bilirubin 0.3 - 1.2 mg/dL <0.2(L) 0.8 0.2(L)  Alkaline Phos 38 - 126 U/L 98 90 100  AST 15 - 41 U/L 35 53(H) 26    ALT 0 - 44 U/L 35 64(H) 23      RADIOGRAPHIC STUDIES: I have personally reviewed the radiological images as listed and agreed with the findings in the report. No results found.   ASSESSMENT & PLAN:  Seana Underwood is a 59 y.o. female with    1. Adenocarcinoma of transverse colon, moderately differentiated, pT4aN1aM1a stage IV with liverand nodalmetastasis,MSS, KRAS G12S(+) -Shewas diagnosed in 01/2019 afteremergent colectomy and liver biopsy. Pathology showed stage IV colonic adenocarcinoma metastatic to liver.11/23/20PET showsknown liver metastasis and metastatic lymphadenopathy in chest and right axilla.  -Wepreviouslydiscussed her cancer is stage IV and no  longer eligible for surgery. The likelihood of curing this is very lowdue to her diffuse metastasis, but still very treatable. -[name] shows MSI stable disease, Kras+. There are no targetable mutation, she is not a candidate for EGFRinhibitoror immunotherapy.Avastin was added with start of full dose FOLFOX3 weeks ago. -I started her on first-line5-FU for 2 cycles on 03/11/20 while her surgical wound healed. She started full doseFOLFOXwith avastin q2weeks on 1/4/21to control her disease. -I personally reviewed and discussed her CT CAP from 08/23/19 which shows slight 15% increased in some liver lesions, but mostly stable disease. Will continue treatment for now and If there is further progression on next scan, will change her treatment at that time to FOLFIRI.  -S/p C12 she is tolerating well with skin darkening and very mild tingling of her fingers with no decreased function. 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  She also has mild renal insufficiency now, will slightly decrease oxaliplatin dose to 70 mg/m -Labs reviewed and adequate to proceed with C13 FOLFOX and Avastin today with dose reduction due to neuropathy and AKI.  -f/u in 2 weeks     2. Anorexia,resolved -She reportedly weighed 253 lbs a few years ago, and weighed 203 lbs at symptom onset~11/2018 -190 lbsin 02/2019.  -Continue to f/u withdietician. -She has been eating well and walking often. She has been able to gain weigh, maintain appetite and strength.  -Weight stable.   3. Iron deficiency anemia, and anemia secondary to cancer -She had low ferritinand low TIBC -Takes oral iron BID, will continue.Anemia continues to improve. -Will give IV iron if needed -Hg decreased to 9.7 (08/27/19). She has had bleeding from her hemorrhoids recently.   4. HTN, uncontrolled -on hydralazine, isosorbide, lisinopril, metoprolol. I started her on amlodipine on 07/16/19.  -She saw her new PCP on 4/16. Medications were not adjusted.  -I discussed avastin can increase her blood pressure. -I previously started her on HCTZ (07/30/19). Continue to monitor.   5. Social support -She worked in [employer] at 2 different jobs, but has been out of work lately due to her symptoms  -She is not getting disability, no pay, but her employer kept her on their insurance plan -She may be eligible for Medicaid [employer] referred toSW to discuss   6. Goals of care discussion -The patient understands the goal of care is palliative. -she is full code now   7. Mild Neuropathy G1 -S/p C12 chemo she notes mild tingling of her fingers only. She denies functional deficits.  -Will dose reduce chemo starting with C13 and continue to watch closely.   8. Rectal pain, Hemorrhoids, Constipation  -She has had mildly bleeding external hemorrhoids lately, which has caused her pain. She was to f/u with her surgeon Dr. Hector Brunswick but has not been reached yet for appointment after setting up payment arrangement. I will copy her.  -I also discussed she can see GI for possible hemorrhoids banding.  -Due to rectal pain flare she went to ED on 5/16 and 5/20. She was given percocet which made  her very drowsy and constipated. I discussed percocet is too strong for this and she should start OTC Tylenol, she is agreeable.  -She did not have stool output for over 2 days, but improved with Miralax. I encouraged her to continue.   9. AKI -Creatinine 1.53 today, it was normal 2 weeks ago -We will give normal saline 1 L over 2 hours today,  I encouraged patient to drink more fluids -Reading CT scan showed no urinary obstruction -We will follow up closely  PLAN: -CT CAP reviewed with patient, SD overall  -Labs reviewed and adequate to proceed with C13 FOLFOX and avastintoday with dose reduction due to neuropathy and mild renal insufficiency.  -Lab, flush, f/u and FOLFOX and Avastin in 2, 4, 6, weeks -Send message to Dr Conners for her hemorrhoids and rectal pain  No problem-specific Assessment & Plan notes found for this encounter.   No orders of the defined types were placed in this encounter.  All questions were answered. The patient knows to call the clinic with any problems, questions or concerns. No barriers to learning was detected. The total time spent in the appointment was 40 minutes.     Truitt Merle, MD 08/27/2019   I, Joslyn Devon, am acting as scribe for Truitt Merle, MD.   I have reviewed the above documentation for accuracy and completeness, and I agree with the above.

## 2019-08-24 NOTE — Patient Instructions (Signed)
-  Nice seeing you today!!  -See you back in 3 months. 

## 2019-08-24 NOTE — Progress Notes (Signed)
Established Patient Office Visit     This visit occurred during the SARS-CoV-2 public health emergency.  Safety protocols were in place, including screening questions prior to the visit, additional usage of staff PPE, and extensive cleaning of exam room while observing appropriate contact time as indicated for disinfecting solutions.    CC/Reason for Visit: BP follow up  HPI: Casey Black is a 59 y.o. female who is coming in today for the above mentioned reasons. 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 She had colectomy and colostomy that admission.  Her surgeon is Dr. Kae Heller.She also has a Port-A-Cath. She is also seeing Dr. Annamaria Boots with oncology and has started her FOLFOX regimen. 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 Other than that she has a history of hypertension that had been well controlled on a multidrug regimen including hydralazine 25 mg 3 times a day, isosorbide mononitrate 30 mg daily, lisinopril 40 mg daily, metoprolol 50 mg twice daily. BP has been high as of late, was started on norvasc 10 about 6 weeks ago.She used to be a heavy smoker of over 22 years but quit after her diagnosis of colon cancer. She has had 2 visits to the ED due to painful hemorrhoids. Has been using anusol and sitz baths with some relief. Is working on securing f/u with surgery. BP is high in office today but had been normal in ed yesterday 105/62. She states when she goes to chemo it is usually very high (she gets nervous). Ambulatory measurements have been at goal. She has received COVID vaccine x 1.   Past Medical/Surgical History: Past Medical History:  Diagnosis Date  . Anemia    low iron  . Cancer [facility])    colon cancer  . Colon cancer ([place]) 01/2019  . Hypertension 01/23/2019  .  Personal history of chemotherapy 01/2019   colon CA  . SBO (small bowel obstruction) ([place]) 01/23/2019    Past Surgical History:  Procedure Laterality Date  . CESAREAN SECTION     x2  . COLOSTOMY N/A 01/24/2019   Procedure: End Loop Colostomy;  Surgeon: Clovis Riley, MD;  Location: [place];  Service: General;  Laterality: N/A;  . PARTIAL COLECTOMY N/A 01/24/2019   Procedure: PARTIAL COLECTOMY;  Surgeon: Clovis Riley, MD;  Location: [place];  Service: General;  Laterality: N/A;  . PORTACATH PLACEMENT Right 02/28/2019   Procedure: INSERTION PORT-A-CATH WITH ULTRASOUND GUIDANCE;  Surgeon: Clovis Riley, MD;  Location: [place];  Service: General;  Laterality: Right;    Social History:  reports that she quit smoking about 7 months ago. Her smoking use included cigarettes. She has a 14.50 pack-year smoking history. She has never used smokeless tobacco. She reports previous alcohol use. She reports that she does not use drugs.  Allergies: No Known Allergies  Family History:  Family History  Problem Relation Age of Onset  . Kidney failure Father   . Hypertension Father   . Hypertension Sister      Current Outpatient Medications:  .  acetaminophen (TYLENOL) 325 MG tablet, Take 2 tablets (650 mg total) by mouth every 6 (six) hours as needed., Disp: , Rfl:  .  amLODipine (NORVASC) 10 MG tablet, TAKE 1 TABLET BY MOUTH EVERY DAY, Disp: 30 tablet, Rfl: 1 .  ferrous sulfate 325 (  65 FE) MG tablet, TAKE 1 TABLET (325 MG TOTAL) BY MOUTH 2 (TWO) TIMES DAILY WITH A MEAL., Disp: 180 tablet, Rfl: 1 .  hydrALAZINE (APRESOLINE) 25 MG tablet, TAKE 1 TABLET (25 MG TOTAL) BY MOUTH EVERY 8 (EIGHT) HOURS., Disp: 90 tablet, Rfl: 1 .  hydrochlorothiazide (HYDRODIURIL) 12.5 MG tablet, Take 1 tablet (12.5 mg total) by mouth daily., Disp: 30 tablet, Rfl: 1 .  isosorbide mononitrate (IMDUR) 30 MG 24 hr tablet, TAKE 1 TABLET BY MOUTH EVERY DAY, Disp: 90 tablet, Rfl: 0 .  lidocaine (LMX) 4 % cream, Apply 1  application topically 3 (three) times daily as needed., Disp: 30 g, Rfl: 0 .  lidocaine-prilocaine (EMLA) cream, Apply to affected area once (Patient taking differently: Apply 1 application topically once. Apply to affected area once), Disp: 30 g, Rfl: 3 .  lisinopril (ZESTRIL) 40 MG tablet, TAKE 1 TABLET BY MOUTH EVERY DAY, Disp: 90 tablet, Rfl: 1 .  loperamide (IMODIUM) 2 MG capsule, TAKE 1 CAPSULE (2 MG TOTAL) BY MOUTH AS NEEDED FOR DIARRHEA OR LOOSE STOOLS., Disp: 90 capsule, Rfl: 0 .  metoprolol tartrate (LOPRESSOR) 50 MG tablet, TAKE 1 TABLET BY MOUTH TWICE A DAY, Disp: 180 tablet, Rfl: 1 .  metroNIDAZOLE (FLAGYL) 250 MG tablet, Take 1 tablet (250 mg total) by mouth 3 (three) times daily., Disp: 1 tablet, Rfl: 0 .  ondansetron (ZOFRAN) 4 MG tablet, Take 1 tablet (4 mg total) by mouth every 6 (six) hours as needed for nausea., Disp: 30 tablet, Rfl: 0 .  ondansetron (ZOFRAN) 8 MG tablet, Take 1 tablet (8 mg total) by mouth 2 (two) times daily as needed for refractory nausea / vomiting. Start on day 3 after chemotherapy., Disp: 30 tablet, Rfl: 1 .  oxyCODONE-acetaminophen (PERCOCET/ROXICET) 5-325 MG tablet, Take 1-2 tablets by mouth every 6 (six) hours as needed for severe pain., Disp: 20 tablet, Rfl: 0 .  polycarbophil (FIBERCON) 625 MG tablet, Take 1 tablet (625 mg total) by mouth daily., Disp: 30 tablet, Rfl: 0 .  prochlorperazine (COMPAZINE) 10 MG tablet, Take 1 tablet (10 mg total) by mouth every 6 (six) hours as needed (Nausea or vomiting)., Disp: 30 tablet, Rfl: 1 .  hydrocortisone (ANUSOL-HC) 2.5 % rectal cream, Place 1 application rectally 2 (two) times daily., Disp: 30 g, Rfl: 0 .  Ibuprofen (ADVIL) 200 MG CAPS, Take 400 mg by mouth daily as needed (pain)., Disp: , Rfl:   Review of Systems:  Constitutional: Denies fever, chills, diaphoresis, appetite change and fatigue.  HEENT: Denies photophobia, eye pain, redness, hearing loss, ear pain, congestion, sore throat, rhinorrhea, sneezing,  mouth sores, trouble swallowing, neck pain, neck stiffness and tinnitus.   Respiratory: Denies SOB, DOE, cough, chest tightness,  and wheezing.   Cardiovascular: Denies chest pain, palpitations and leg swelling.  Gastrointestinal: Denies nausea, vomiting, abdominal pain, diarrhea, constipation, blood in stool and abdominal distention.  Genitourinary: Denies dysuria, urgency, frequency, hematuria, flank pain and difficulty urinating.  Endocrine: Denies: hot or cold intolerance, sweats, changes in hair or nails, polyuria, polydipsia. Musculoskeletal: Denies myalgias, back pain, joint swelling, arthralgias and gait problem.  Skin: Denies pallor, rash and wound.  Neurological: Denies dizziness, seizures, syncope, weakness, light-headedness, numbness and headaches.  Hematological: Denies adenopathy. Easy bruising, personal or family bleeding history  Psychiatric/Behavioral: Denies suicidal ideation, mood changes, confusion, nervousness, sleep disturbance and agitation    Physical Exam: Vitals:   08/24/19 0753  BP: (!) 150/90  Pulse: (!) 56  Temp: (!) 97.2 F (36.2 C)  TempSrc:  Temporal  SpO2: 98%  Weight: 184 lb 6.4 oz (83.6 kg)    Body mass index is 28.88 kg/m.   Constitutional: NAD, calm, comfortable Eyes: PERRL, lids and conjunctivae normal ENMT: Mucous membranes are moist.  Respiratory: clear to auscultation bilaterally, no wheezing, no crackles. Normal respiratory effort. No accessory muscle use.  Cardiovascular: Regular rate and rhythm, no murmurs / rubs / gallops. No extremity edema.  Neurologic: grossly intact and non-focal Psychiatric: Normal judgment and insight. Alert and oriented x 3. Normal mood.    Impression and Plan:  Essential hypertension -On a multidrug regimen: lisinopril 40, imdur 30, metoprolol 50 BID and hydralazine 25 TID, norvasc 10. -BP elevated in office today, but normal in ED yesterday. Ambulatory measurements have also been good. -No changes today,  f/u in 6 weeks for BP check.  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) - On chemo, followed by onc.  Hemorrhoids, unspecified hemorrhoid type -F/u with surgery soon.    Patient Instructions  -Nice seeing you today!!  -See you back in 3 months.     Lelon Frohlich, MD [facility]

## 2019-08-27 ENCOUNTER — Inpatient Hospital Stay (HOSPITAL_BASED_OUTPATIENT_CLINIC_OR_DEPARTMENT_OTHER): Payer: BC Managed Care – PPO | Admitting: Hematology

## 2019-08-27 ENCOUNTER — Inpatient Hospital Stay: Payer: BC Managed Care – PPO

## 2019-08-27 ENCOUNTER — Other Ambulatory Visit: Payer: Self-pay

## 2019-08-27 ENCOUNTER — Encounter: Payer: Self-pay | Admitting: Hematology

## 2019-08-27 VITALS — BP 136/62 | HR 54 | Temp 97.8°F | Resp 18 | Ht 67.0 in | Wt 184.4 lb

## 2019-08-27 DIAGNOSIS — Z95828 Presence of other vascular implants and grafts: Secondary | ICD-10-CM

## 2019-08-27 DIAGNOSIS — C189 Malignant neoplasm of colon, unspecified: Secondary | ICD-10-CM

## 2019-08-27 DIAGNOSIS — Z5111 Encounter for antineoplastic chemotherapy: Secondary | ICD-10-CM | POA: Diagnosis not present

## 2019-08-27 DIAGNOSIS — C787 Secondary malignant neoplasm of liver and intrahepatic bile duct: Secondary | ICD-10-CM | POA: Diagnosis not present

## 2019-08-27 DIAGNOSIS — Z7189 Other specified counseling: Secondary | ICD-10-CM

## 2019-08-27 DIAGNOSIS — C773 Secondary and unspecified malignant neoplasm of axilla and upper limb lymph nodes: Secondary | ICD-10-CM | POA: Diagnosis not present

## 2019-08-27 DIAGNOSIS — C184 Malignant neoplasm of transverse colon: Secondary | ICD-10-CM | POA: Diagnosis not present

## 2019-08-27 DIAGNOSIS — I10 Essential (primary) hypertension: Secondary | ICD-10-CM | POA: Diagnosis not present

## 2019-08-27 DIAGNOSIS — Z5112 Encounter for antineoplastic immunotherapy: Secondary | ICD-10-CM | POA: Diagnosis not present

## 2019-08-27 LAB — CMP (CANCER CENTER ONLY)
ALT: 35 U/L (ref 0–44)
AST: 35 U/L (ref 15–41)
Albumin: 3.3 g/dL — ABNORMAL LOW (ref 3.5–5.0)
Alkaline Phosphatase: 98 U/L (ref 38–126)
Anion gap: 9 (ref 5–15)
BUN: 26 mg/dL — ABNORMAL HIGH (ref 6–20)
CO2: 22 mmol/L (ref 22–32)
Calcium: 9.9 mg/dL (ref 8.9–10.3)
Chloride: 104 mmol/L (ref 98–111)
Creatinine: 1.53 mg/dL — ABNORMAL HIGH (ref 0.44–1.00)
GFR, Est AFR Am: 43 mL/min — ABNORMAL LOW (ref >60)
GFR, Estimated: 37 mL/min — ABNORMAL LOW (ref >60)
Glucose, Bld: 92 mg/dL (ref 70–99)
Potassium: 4.4 mmol/L (ref 3.5–5.1)
Sodium: 135 mmol/L (ref 135–145)
Total Bilirubin: <0.2 mg/dL — ABNORMAL LOW (ref 0.3–1.2)
Total Protein: 7.6 g/dL (ref 6.5–8.1)

## 2019-08-27 LAB — CBC WITH DIFFERENTIAL (CANCER CENTER ONLY)
Abs Immature Granulocytes: 0.04 10*3/uL (ref 0.00–0.07)
Basophils Absolute: 0 10*3/uL (ref 0.0–0.1)
Basophils Relative: 1 %
Eosinophils Absolute: 0.2 10*3/uL (ref 0.0–0.5)
Eosinophils Relative: 3 %
HCT: 30.2 % — ABNORMAL LOW (ref 36.0–46.0)
Hemoglobin: 9.7 g/dL — ABNORMAL LOW (ref 12.0–15.0)
Immature Granulocytes: 1 %
Lymphocytes Relative: 30 %
Lymphs Abs: 1.7 10*3/uL (ref 0.7–4.0)
MCH: 30.2 pg (ref 26.0–34.0)
MCHC: 32.1 g/dL (ref 30.0–36.0)
MCV: 94.1 fL (ref 80.0–100.0)
Monocytes Absolute: 0.8 10*3/uL (ref 0.1–1.0)
Monocytes Relative: 14 %
Neutro Abs: 2.9 10*3/uL (ref 1.7–7.7)
Neutrophils Relative %: 51 %
Platelet Count: 159 10*3/uL (ref 150–400)
RBC: 3.21 MIL/uL — ABNORMAL LOW (ref 3.87–5.11)
RDW: 16 % — ABNORMAL HIGH (ref 11.5–15.5)
WBC Count: 5.6 10*3/uL (ref 4.0–10.5)
nRBC: 0 % (ref 0.0–0.2)

## 2019-08-27 LAB — TOTAL PROTEIN, URINE DIPSTICK: Protein, ur: NEGATIVE mg/dL

## 2019-08-27 MED ORDER — SODIUM CHLORIDE 0.9 % IV SOLN
10.0000 mg | Freq: Once | INTRAVENOUS | Status: AC
  Administered 2019-08-27: 10 mg via INTRAVENOUS
  Filled 2019-08-27: qty 10

## 2019-08-27 MED ORDER — DEXTROSE 5 % IV SOLN
Freq: Once | INTRAVENOUS | Status: AC
  Filled 2019-08-27: qty 250

## 2019-08-27 MED ORDER — LEUCOVORIN CALCIUM INJECTION 350 MG
400.0000 mg/m2 | Freq: Once | INTRAVENOUS | Status: AC
  Administered 2019-08-27: 808 mg via INTRAVENOUS
  Filled 2019-08-27: qty 40.4

## 2019-08-27 MED ORDER — SODIUM CHLORIDE 0.9 % IV SOLN
2470.0000 mg/m2 | INTRAVENOUS | Status: DC
  Administered 2019-08-27: 5000 mg via INTRAVENOUS
  Filled 2019-08-27: qty 100

## 2019-08-27 MED ORDER — OXALIPLATIN CHEMO INJECTION 100 MG/20ML
70.0000 mg/m2 | Freq: Once | INTRAVENOUS | Status: AC
  Administered 2019-08-27: 140 mg via INTRAVENOUS
  Filled 2019-08-27: qty 28

## 2019-08-27 MED ORDER — PALONOSETRON HCL INJECTION 0.25 MG/5ML
0.2500 mg | Freq: Once | INTRAVENOUS | Status: AC
  Administered 2019-08-27: 0.25 mg via INTRAVENOUS

## 2019-08-27 MED ORDER — SODIUM CHLORIDE 0.9% FLUSH
10.0000 mL | INTRAVENOUS | Status: DC | PRN
  Administered 2019-08-27: 10 mL
  Filled 2019-08-27: qty 10

## 2019-08-27 MED ORDER — PALONOSETRON HCL INJECTION 0.25 MG/5ML
INTRAVENOUS | Status: AC
  Filled 2019-08-27: qty 5

## 2019-08-27 MED ORDER — SODIUM CHLORIDE 0.9 % IV SOLN
5.0000 mg/kg | Freq: Once | INTRAVENOUS | Status: AC
  Administered 2019-08-27: 400 mg via INTRAVENOUS
  Filled 2019-08-27: qty 16

## 2019-08-27 MED ORDER — SODIUM CHLORIDE 0.9 % IV SOLN
Freq: Once | INTRAVENOUS | Status: AC
  Filled 2019-08-27: qty 250

## 2019-08-27 NOTE — Progress Notes (Signed)
Per Dr. Burr Medico, [facility] to treat with Scr 1.53. Patient to get 1 L NS over 2 hours in addition to treatment today.

## 2019-08-27 NOTE — Patient Instructions (Signed)
[facility] Discharge Instructions for Patients Receiving Chemotherapy  Today you received the following chemotherapy agents: Oxaliplatin, leucovorin, 5FU, bevacizumab  To help prevent nausea and vomiting after your treatment, we encourage you to take your nausea medication as directed.   If you develop nausea and vomiting that is not controlled by your nausea medication, call the clinic.   BELOW ARE SYMPTOMS THAT SHOULD BE REPORTED IMMEDIATELY:  *FEVER GREATER THAN 100.5 F  *CHILLS WITH OR WITHOUT FEVER  NAUSEA AND VOMITING THAT IS NOT CONTROLLED WITH YOUR NAUSEA MEDICATION  *UNUSUAL SHORTNESS OF BREATH  *UNUSUAL BRUISING OR BLEEDING  TENDERNESS IN MOUTH AND THROAT WITH OR WITHOUT PRESENCE OF ULCERS  *URINARY PROBLEMS  *BOWEL PROBLEMS  UNUSUAL RASH Items with * indicate a potential emergency and should be followed up as soon as possible.  Feel free to call the clinic should you have any questions or concerns. The clinic phone number is [phone].  Please show the CHEMO ALERT CARD at check-in to the Emergency Department and triage nurse.   

## 2019-08-29 ENCOUNTER — Inpatient Hospital Stay: Payer: BC Managed Care – PPO

## 2019-08-29 ENCOUNTER — Other Ambulatory Visit: Payer: Self-pay

## 2019-08-29 VITALS — BP 128/72 | HR 78 | Temp 98.2°F | Resp 18

## 2019-08-29 DIAGNOSIS — Z5112 Encounter for antineoplastic immunotherapy: Secondary | ICD-10-CM | POA: Diagnosis not present

## 2019-08-29 DIAGNOSIS — C773 Secondary and unspecified malignant neoplasm of axilla and upper limb lymph nodes: Secondary | ICD-10-CM | POA: Diagnosis not present

## 2019-08-29 DIAGNOSIS — C184 Malignant neoplasm of transverse colon: Secondary | ICD-10-CM | POA: Diagnosis not present

## 2019-08-29 DIAGNOSIS — Z5111 Encounter for antineoplastic chemotherapy: Secondary | ICD-10-CM | POA: Diagnosis not present

## 2019-08-29 DIAGNOSIS — C189 Malignant neoplasm of colon, unspecified: Secondary | ICD-10-CM

## 2019-08-29 DIAGNOSIS — C787 Secondary malignant neoplasm of liver and intrahepatic bile duct: Secondary | ICD-10-CM | POA: Diagnosis not present

## 2019-08-29 DIAGNOSIS — Z7189 Other specified counseling: Secondary | ICD-10-CM

## 2019-08-29 MED ORDER — SODIUM CHLORIDE 0.9% FLUSH
10.0000 mL | INTRAVENOUS | Status: DC | PRN
  Administered 2019-08-29: 10 mL
  Filled 2019-08-29: qty 10

## 2019-08-29 MED ORDER — HEPARIN SOD (PORK) LOCK FLUSH 100 UNIT/ML IV SOLN
500.0000 [IU] | Freq: Once | INTRAVENOUS | Status: AC | PRN
  Administered 2019-08-29: 500 [IU]
  Filled 2019-08-29: qty 5

## 2019-09-03 ENCOUNTER — Other Ambulatory Visit: Payer: Self-pay | Admitting: Hematology

## 2019-09-03 ENCOUNTER — Other Ambulatory Visit: Payer: Self-pay | Admitting: Internal Medicine

## 2019-09-05 DIAGNOSIS — Z933 Colostomy status: Secondary | ICD-10-CM | POA: Diagnosis not present

## 2019-09-09 NOTE — Progress Notes (Addendum)
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 Bowel-gas pattern consistent with small bowel obstruction. No free air. 2. No acute chest findings.   01/23/2019 Imaging   CT AP IMPRESSION: Obstructing mid transverse colonic mass with mild regional adenopathy and hepatic metastatic disease. The mass likely extends through the serosa; no ascites or peritoneal nodularity.   01/24/2019 Surgery   Surgeon: Clovis Riley MD Assistant: [name] Procedure performed: Transverse colectomy with end colostomy, liver biopsy Procedure classification: URGENT/EMERGENT Preop diagnosis: Obstructing, metastatic transverse colon mass Post-op diagnosis/intraop findings: Same   01/24/2019 Pathology Results   FINAL MICROSCOPIC DIAGNOSIS:   A. COLON, TRANSVERSE, RESECTION:  Colonic adenocarcinoma, 5 cm.  Carcinoma extends into pericolonic connective tissue and focally to  serosal surface.  Margins not involved.  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   B. LIVER NODULE, LEFT, BIOPSY:  Metastatic adenocarcinoma.    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 Hypermetabolic metastatic disease in the liver and mediastinal/hilar/axillary lymph nodes. 2. Focal hypermetabolism in the rectum. Continued attention on follow-up exams is warranted. 3. Focal hypermetabolism medial to the right adrenal gland may be within a metastatic lymph node, better visualized on 01/23/2019. 4. Aortic atherosclerosis (ICD10-170.0). Coronary artery calcification.   03/12/2019 -  Chemotherapy   She started 5FU q2weeks on 03/12/19 for 2 cycles. She started full dose FOLFOX with Avastin on 04/09/19.   05/31/2019 Imaging   Restaging CT CAP IMPRESSION: 1. Similar to mild interval decrease in size of multiple hepatic lesions, partially calcified. 2. 2 mm right upper lobe pulmonary nodule. Recommend attention on follow-up. 3. Emphysema and aortic atherosclerosis.   08/23/2019 Imaging   CT CAP w contrast  IMPRESSION: 1. The dominant peripheral right liver metastasis has mildly increased. Other smaller liver metastases are stable. 2. Otherwise no new or progressive metastatic disease in the chest, abdomen or pelvis. 3. Aortic Atherosclerosis (ICD10-I70.0) and Emphysema (ICD10-J43.9).     CURRENT THERAPY: She started 5FU q2weeks on 03/12/19 for 2 cycles. She started full dose FOLFOX with Avastin on 04/09/19. Oxaliplatin reduced to 70 mg/m2 on cycle 13 due to neuropathy and renal dysfunction; to be further dose reduced to 40 mg/m2 on cycle 14 due to neuropathy   INTERVAL HISTORY: Casey Black returns for f/u and treatment as scheduled. She completed cycle 13 FOLFOX and avastin on 08/27/19. Oxaliplatin was slightly dose reduced for neuropathy in her fingertips. She has cold sensitivity for 5 days. She developed new numbness and tingling in her feet  a week ago. She remains able  to ambulate, balance, and function well but she is worried about this and did not drive much until she talked to [place]. Denies pain or sensitivity. Palms are dark. Otherwise, eating and drinking well with good energy, no mucositis, n/v/c/d, fever, chills, cough, chest pain, dyspnea leg edema, or pain. Has gas and rectal pressure without recent bleeding. Will see Dr. Kae Heller Thursday.   MEDICAL HISTORY:  Past Medical History:  Diagnosis Date  . Anemia    low iron  . Cancer [facility])    colon cancer  . Colon cancer ([place]) 01/2019  . Hypertension 01/23/2019  . Personal history of chemotherapy 01/2019   colon CA  . SBO (small bowel obstruction) ([place]) 01/23/2019    SURGICAL HISTORY: Past Surgical History:  Procedure Laterality Date  . CESAREAN SECTION     x2  . COLOSTOMY N/A 01/24/2019   Procedure: End Loop Colostomy;  Surgeon: Clovis Riley, MD;  Location: [place];  Service: General;  Laterality: N/A;  . PARTIAL COLECTOMY N/A 01/24/2019   Procedure: PARTIAL COLECTOMY;  Surgeon: Clovis Riley, MD;  Location: [place];  Service: General;  Laterality: N/A;  . PORTACATH PLACEMENT Right 02/28/2019   Procedure: INSERTION PORT-A-CATH WITH ULTRASOUND GUIDANCE;  Surgeon: Clovis Riley, MD;  Location: [place];  Service: General;  Laterality: Right;    I have reviewed the social history and family history with the patient and they are unchanged from previous note.  ALLERGIES:  has No Known Allergies.  MEDICATIONS:  Current Outpatient Medications  Medication Sig Dispense Refill  . acetaminophen (TYLENOL) 325 MG tablet Take 2 tablets (650 mg total) by mouth every 6 (six) hours as needed.    Marland Kitchen amLODipine (NORVASC) 10 MG tablet TAKE 1 TABLET BY MOUTH EVERY DAY 30 tablet 1  . ferrous sulfate 325 (65 FE) MG tablet TAKE 1 TABLET (325 MG TOTAL) BY MOUTH 2 (TWO) TIMES DAILY WITH A MEAL. 180 tablet 1  . hydrALAZINE (APRESOLINE) 25 MG tablet TAKE 1 TABLET (25 MG TOTAL) BY MOUTH EVERY 8 (EIGHT) HOURS. 90  tablet 1  . hydrochlorothiazide (HYDRODIURIL) 12.5 MG tablet TAKE 1 TABLET BY MOUTH EVERY DAY 30 tablet 1  . hydrocortisone (ANUSOL-HC) 2.5 % rectal cream Place 1 application rectally 2 (two) times daily. 30 g 0  . Ibuprofen (ADVIL) 200 MG CAPS Take 400 mg by mouth daily as needed (pain).    . isosorbide mononitrate (IMDUR) 30 MG 24 hr tablet TAKE 1 TABLET BY MOUTH EVERY DAY 90 tablet 0  . lidocaine (LMX) 4 % cream Apply 1 application topically 3 (three) times daily as needed. 30 g 0  . lidocaine-prilocaine (EMLA) cream Apply to affected area once (Patient taking differently: Apply 1 application topically once. Apply to affected area once) 30 g 3  . lisinopril (ZESTRIL) 40 MG tablet TAKE 1 TABLET BY MOUTH EVERY DAY 90 tablet 1  . loperamide (IMODIUM) 2 MG capsule TAKE 1 CAPSULE (2 MG TOTAL) BY MOUTH AS NEEDED FOR DIARRHEA OR LOOSE STOOLS. 90 capsule 0  . metoprolol tartrate (LOPRESSOR) 50 MG tablet TAKE 1 TABLET BY MOUTH TWICE A DAY 180 tablet 1  . ondansetron (ZOFRAN) 4 MG tablet Take 1 tablet (4 mg total) by mouth every 6 (six) hours as needed for nausea. 30 tablet 0  . ondansetron (ZOFRAN) 8 MG tablet Take 1 tablet (8 mg total) by mouth 2 (two) times daily as needed for refractory nausea / vomiting. Start on day 3 after  chemotherapy. 30 tablet 1  . oxyCODONE-acetaminophen (PERCOCET/ROXICET) 5-325 MG tablet Take 1-2 tablets by mouth every 6 (six) hours as needed for severe pain. 20 tablet 0  . polycarbophil (FIBERCON) 625 MG tablet Take 1 tablet (625 mg total) by mouth daily. 30 tablet 0  . prochlorperazine (COMPAZINE) 10 MG tablet Take 1 tablet (10 mg total) by mouth every 6 (six) hours as needed (Nausea or vomiting). 30 tablet 1  . metroNIDAZOLE (FLAGYL) 250 MG tablet Take 1 tablet (250 mg total) by mouth 3 (three) times daily. 1 tablet 0   No current facility-administered medications for this visit.   Facility-Administered Medications Ordered in Other Visits  Medication Dose Route Frequency  Provider Last Rate Last Admin  . 0.9 %  sodium chloride infusion   Intravenous Continuous [name], NP 20 mL/hr at 09/10/19 1013 New Bag at 09/10/19 1013  . dextrose 5 % solution   Intravenous Once Truitt Merle, MD      . fluorouracil (ADRUCIL) 5,000 mg in sodium chloride 0.9 % 150 mL chemo infusion  2,470 mg/m2 (Treatment Plan Recorded) Intravenous 1 day or 1 dose Truitt Merle, MD      . leucovorin 808 mg in dextrose 5 % 250 mL infusion  400 mg/m2 (Treatment Plan Recorded) Intravenous Once Truitt Merle, MD      . oxaliplatin (ELOXATIN) 80 mg in dextrose 5 % 500 mL chemo infusion  40 mg/m2 (Treatment Plan Recorded) Intravenous Once Truitt Merle, MD      . sodium chloride flush (NS) 0.9 % injection 10 mL  10 mL Intracatheter PRN Truitt Merle, MD        PHYSICAL EXAMINATION: ECOG PERFORMANCE STATUS: 1 - Symptomatic but completely ambulatory  Vitals:   09/10/19 0930 09/10/19 0934  BP: (!) 150/72   Pulse: (!) 43 (!) 53  Resp: 20   Temp: 98.8 F (37.1 C)   SpO2: 100%    Filed Weights   09/10/19 0930  Weight: 186 lb 8 oz (84.6 kg)    GENERAL:alert, no distress and comfortable SKIN: no rash. Palms with hyperpigmentation and dryness  EYES: sclera clear NECK: without mass LUNGS: clear with normal breathing effort HEART: bradycardic, no lower extremity edema ABDOMEN: abdomen soft, non-tender and normal bowel sounds. Brown stool in colostomy collection bag  NEURO: alert & oriented x 3 with fluent speech, moderately decreased vibratory sense in feet up to ankle, intact vibratory sense to fingertips per tuning fork exam PAC without erythema   LABORATORY DATA:  I have reviewed the data as listed CBC Latest Ref Rng & Units 09/10/2019 08/27/2019 08/19/2019  WBC 4.0 - 10.5 K/uL 4.1 5.6 4.9  Hemoglobin 12.0 - 15.0 g/dL 9.7(L) 9.7(L) 12.0  Hematocrit 36.0 - 46.0 % 30.7(L) 30.2(L) 37.0  Platelets 150 - 400 K/uL 125(L) 159 164     CMP Latest Ref Rng & Units 09/10/2019 08/27/2019 08/19/2019  Glucose 70 - 99  mg/dL 90 92 102(H)  BUN 6 - 20 mg/dL 24(H) 26(H) 30(H)  Creatinine 0.44 - 1.00 mg/dL 0.92 1.53(H) 1.30(H)  Sodium 135 - 145 mmol/L 140 135 133(L)  Potassium 3.5 - 5.1 mmol/L 4.9 4.4 4.9  Chloride 98 - 111 mmol/L 111 104 102  CO2 22 - 32 mmol/L 19(L) 22 21(L)  Calcium 8.9 - 10.3 mg/dL 9.9 9.9 10.2  Total Protein 6.5 - 8.1 g/dL 7.2 7.6 8.4(H)  Total Bilirubin 0.3 - 1.2 mg/dL 0.2(L) <0.2(L) 0.8  Alkaline Phos 38 - 126 U/L 98 98 90  AST 15 -  41 U/L 25 35 53(H)  ALT 0 - 44 U/L 25 35 64(H)      RADIOGRAPHIC STUDIES: I have personally reviewed the radiological images as listed and agreed with the findings in the report. No results found.   ASSESSMENT & PLAN: Casey Black a 58y.o.femalewith   1. Adenocarcinoma of transverse colon, moderately differentiated, pT4aN1aM1a stage IV with liverand nodalmetastasis, MMR normal  -Diagnosed in 01/2019 afteremergent colectomy and liver biopsy. 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  -PET from 02/26/19 showsknown liver metastasis and metastatic lymphadenopathy in chest and right axilla.  -[name] shows MSI stable disease, Kras+ No targetable mutation, she is not a candidate for EGFR or immunotherapy. -Began palliative first line chemo on 03/12/2019, received 5FU/leuc with first 2 cycles for large open abdominal wound after surgery. She started full dose FOLFOX and avastin with cycle 2, s/p13cycles of treatment -Interim scans have been stable overall  -She developed CIPN after cycle 12, oxali dose reduced to 70 mg/m2 with cycle 13. Symptoms progressed and further reduced to 40 mg/m2 from cycle 14   2. Chemotherapy induced peripheral neuropathy (CIPN), G2 -secondary to Oxaliplatin, developed after cycle 12 FOLFOX -initially began in her fingertips, cycle 13 Oxali dose reduced to 70 mg/m2 -she has progressive neuropathy in fingertips and new neuropathy in her feet since last cycle. She has moderate  sensory deficit in her feet but still able to function and ambulate well. No pain or sensitivity  -We will reduce Oxaliplatin further to 40 mg/m2 from today's cycle 14. I discussed this is a known side effect and is a dose-limiting SE of Oxaliplatin. We will likely have to stop it soon. She understands.  -If she has progressive symptoms, will add gabapentin next time.  -F/u closely   3.Nutrition/Anorexia  -she reportedly weighed 253 lbs a few years ago, and weighed 203 lbs at symptom onset~11/2018 -190 lbsin 02/2019.  -Continue to f/u withdietician -she has been able to gain weight on chemo and stable lately -continue monitoring   4. Iron deficiency anemia, and anemia secondary to cancer -She had low ferritinand low TIBC -IDA responding to ferrous sulfate BID -iron studies remain normal, continue monitoring   5. HTN -Initially on hydralazine, isosorbide, lisinopril, metoprolol.  -Avastin and anxiety contributing to BP elevation in clinic. Received clonidine PRN on chemo days.  -Recently added amlodipine and HCTZ with improvement. Tolerating well without sx of hypotension  6. Social support -She worked in [employer] at 2 different jobs, but has been out of work lately due to her symptoms  -She is not getting disability, no pay, but her employer kept her on their insurance plan -has been referred to SW to discuss financial option/resources  7. Goals of care discussion -Goal is palliative, to control disease and prolong her life. -wehavediscussedher cancer is not curable at this stage, and that we will continue treatment as long as she can tolerate and her disease is controlled.she understands her treatment plan will evolve depending on her response and side effects of treatment.  -Full code  Disposition:  Casey Black appears well. She completed Cycle 13 FOLFOX/avastin. She has tolerated treatment very well overall. She developed mild neuropathy after cycle 12,  oxaliplatin was dose-reduced to 70 mg/m2. Today she has progressive neuropathy with sensory deficit in her feet. She remains able to ambulate and function well. I am recommending further oxaliplatin dose reduction. Dr. Burr Medico to reduce to 40 mg/m2. She will begin B complex vitamin. If she  has progressive symptoms of CIPN, will add gabapentin. I discussed we will likely have to d/c Oxali soon due to neuropathy.   CBC and CMP reviewed. She has mild anemia and thrombocytopenia, secondary to chemo. Mild anemia may also be related to recent bleeding hemorrhoids. She will see Dr. Kae Heller this week to discuss. Scr normalized. Iron level today remains normal, she continues responding well to oral iron. No need for IV iron at this time. Continue oral ferrous sulfate BID for now. CEA slightly decreased, overall stable, continue monitoring. Labs adequate to proceed with cycle 14 FOLFOX and avastin with further Oxali dose reduction. She will return for f/u and next cycle in 2 weeks.   All questions were answered. The patient knows to call the clinic with any problems, questions or concerns. No barriers to learning was detected. Total encounter time was 30 minutes.      [name], NP 09/10/19

## 2019-09-10 ENCOUNTER — Inpatient Hospital Stay: Payer: BC Managed Care – PPO | Attending: Nurse Practitioner

## 2019-09-10 ENCOUNTER — Other Ambulatory Visit: Payer: Self-pay

## 2019-09-10 ENCOUNTER — Inpatient Hospital Stay: Payer: BC Managed Care – PPO

## 2019-09-10 ENCOUNTER — Encounter: Payer: Self-pay | Admitting: Nurse Practitioner

## 2019-09-10 ENCOUNTER — Inpatient Hospital Stay (HOSPITAL_BASED_OUTPATIENT_CLINIC_OR_DEPARTMENT_OTHER): Payer: BC Managed Care – PPO | Admitting: Nurse Practitioner

## 2019-09-10 VITALS — BP 150/72 | HR 53 | Temp 98.8°F | Resp 20 | Ht 67.0 in | Wt 186.5 lb

## 2019-09-10 DIAGNOSIS — C184 Malignant neoplasm of transverse colon: Secondary | ICD-10-CM | POA: Diagnosis not present

## 2019-09-10 DIAGNOSIS — C778 Secondary and unspecified malignant neoplasm of lymph nodes of multiple regions: Secondary | ICD-10-CM | POA: Diagnosis not present

## 2019-09-10 DIAGNOSIS — C189 Malignant neoplasm of colon, unspecified: Secondary | ICD-10-CM

## 2019-09-10 DIAGNOSIS — T451X5A Adverse effect of antineoplastic and immunosuppressive drugs, initial encounter: Secondary | ICD-10-CM

## 2019-09-10 DIAGNOSIS — Z5111 Encounter for antineoplastic chemotherapy: Secondary | ICD-10-CM | POA: Diagnosis not present

## 2019-09-10 DIAGNOSIS — G620 Drug-induced polyneuropathy: Secondary | ICD-10-CM

## 2019-09-10 DIAGNOSIS — D630 Anemia in neoplastic disease: Secondary | ICD-10-CM | POA: Diagnosis not present

## 2019-09-10 DIAGNOSIS — D509 Iron deficiency anemia, unspecified: Secondary | ICD-10-CM | POA: Diagnosis not present

## 2019-09-10 DIAGNOSIS — C787 Secondary malignant neoplasm of liver and intrahepatic bile duct: Secondary | ICD-10-CM

## 2019-09-10 DIAGNOSIS — Z5112 Encounter for antineoplastic immunotherapy: Secondary | ICD-10-CM | POA: Diagnosis not present

## 2019-09-10 DIAGNOSIS — Z7189 Other specified counseling: Secondary | ICD-10-CM

## 2019-09-10 DIAGNOSIS — C773 Secondary and unspecified malignant neoplasm of axilla and upper limb lymph nodes: Secondary | ICD-10-CM | POA: Insufficient documentation

## 2019-09-10 LAB — CBC WITH DIFFERENTIAL (CANCER CENTER ONLY)
Abs Immature Granulocytes: 0.01 10*3/uL (ref 0.00–0.07)
Basophils Absolute: 0 10*3/uL (ref 0.0–0.1)
Basophils Relative: 1 %
Eosinophils Absolute: 0.2 10*3/uL (ref 0.0–0.5)
Eosinophils Relative: 4 %
HCT: 30.7 % — ABNORMAL LOW (ref 36.0–46.0)
Hemoglobin: 9.7 g/dL — ABNORMAL LOW (ref 12.0–15.0)
Immature Granulocytes: 0 %
Lymphocytes Relative: 43 %
Lymphs Abs: 1.7 10*3/uL (ref 0.7–4.0)
MCH: 30.5 pg (ref 26.0–34.0)
MCHC: 31.6 g/dL (ref 30.0–36.0)
MCV: 96.5 fL (ref 80.0–100.0)
Monocytes Absolute: 0.3 10*3/uL (ref 0.1–1.0)
Monocytes Relative: 8 %
Neutro Abs: 1.8 10*3/uL (ref 1.7–7.7)
Neutrophils Relative %: 44 %
Platelet Count: 125 10*3/uL — ABNORMAL LOW (ref 150–400)
RBC: 3.18 MIL/uL — ABNORMAL LOW (ref 3.87–5.11)
RDW: 16.8 % — ABNORMAL HIGH (ref 11.5–15.5)
WBC Count: 4.1 10*3/uL (ref 4.0–10.5)
nRBC: 0 % (ref 0.0–0.2)

## 2019-09-10 LAB — CMP (CANCER CENTER ONLY)
ALT: 25 U/L (ref 0–44)
AST: 25 U/L (ref 15–41)
Albumin: 3.3 g/dL — ABNORMAL LOW (ref 3.5–5.0)
Alkaline Phosphatase: 98 U/L (ref 38–126)
Anion gap: 10 (ref 5–15)
BUN: 24 mg/dL — ABNORMAL HIGH (ref 6–20)
CO2: 19 mmol/L — ABNORMAL LOW (ref 22–32)
Calcium: 9.9 mg/dL (ref 8.9–10.3)
Chloride: 111 mmol/L (ref 98–111)
Creatinine: 0.92 mg/dL (ref 0.44–1.00)
GFR, Est AFR Am: >60 mL/min (ref >60)
GFR, Estimated: >60 mL/min (ref >60)
Glucose, Bld: 90 mg/dL (ref 70–99)
Potassium: 4.9 mmol/L (ref 3.5–5.1)
Sodium: 140 mmol/L (ref 135–145)
Total Bilirubin: 0.2 mg/dL — ABNORMAL LOW (ref 0.3–1.2)
Total Protein: 7.2 g/dL (ref 6.5–8.1)

## 2019-09-10 LAB — CEA (IN HOUSE-CHCC): CEA (CHCC-In House): 44.31 ng/mL — ABNORMAL HIGH (ref 0.00–5.00)

## 2019-09-10 LAB — IRON AND TIBC
Iron: 134 ug/dL (ref 41–142)
Saturation Ratios: 45 % (ref 21–57)
TIBC: 295 ug/dL (ref 236–444)
UIBC: 162 ug/dL (ref 120–384)

## 2019-09-10 LAB — TOTAL PROTEIN, URINE DIPSTICK
Protein, ur: NEGATIVE mg/dL
Protein, ur: NEGATIVE mg/dL

## 2019-09-10 LAB — FERRITIN: Ferritin: 209 ng/mL (ref 11–307)

## 2019-09-10 MED ORDER — LEUCOVORIN CALCIUM INJECTION 350 MG
400.0000 mg/m2 | Freq: Once | INTRAVENOUS | Status: AC
  Administered 2019-09-10: 808 mg via INTRAVENOUS
  Filled 2019-09-10: qty 40.4

## 2019-09-10 MED ORDER — SODIUM CHLORIDE 0.9% FLUSH
10.0000 mL | INTRAVENOUS | Status: DC | PRN
  Filled 2019-09-10: qty 10

## 2019-09-10 MED ORDER — SODIUM CHLORIDE 0.9 % IV SOLN
5.0000 mg/kg | Freq: Once | INTRAVENOUS | Status: AC
  Administered 2019-09-10: 400 mg via INTRAVENOUS
  Filled 2019-09-10: qty 16

## 2019-09-10 MED ORDER — PALONOSETRON HCL INJECTION 0.25 MG/5ML
0.2500 mg | Freq: Once | INTRAVENOUS | Status: AC
  Administered 2019-09-10: 0.25 mg via INTRAVENOUS

## 2019-09-10 MED ORDER — OXALIPLATIN CHEMO INJECTION 100 MG/20ML
40.0000 mg/m2 | Freq: Once | INTRAVENOUS | Status: AC
  Administered 2019-09-10: 80 mg via INTRAVENOUS
  Filled 2019-09-10: qty 16

## 2019-09-10 MED ORDER — SODIUM CHLORIDE 0.9 % IV SOLN
INTRAVENOUS | Status: DC
  Filled 2019-09-10: qty 250

## 2019-09-10 MED ORDER — PALONOSETRON HCL INJECTION 0.25 MG/5ML
INTRAVENOUS | Status: AC
  Filled 2019-09-10: qty 5

## 2019-09-10 MED ORDER — SODIUM CHLORIDE 0.9 % IV SOLN
10.0000 mg | Freq: Once | INTRAVENOUS | Status: AC
  Administered 2019-09-10: 10 mg via INTRAVENOUS
  Filled 2019-09-10: qty 10

## 2019-09-10 MED ORDER — DEXTROSE 5 % IV SOLN
Freq: Once | INTRAVENOUS | Status: AC
  Filled 2019-09-10: qty 250

## 2019-09-10 MED ORDER — SODIUM CHLORIDE 0.9 % IV SOLN
2470.0000 mg/m2 | INTRAVENOUS | Status: DC
  Administered 2019-09-10: 5000 mg via INTRAVENOUS
  Filled 2019-09-10: qty 100

## 2019-09-10 NOTE — Patient Instructions (Signed)
[facility] Discharge Instructions for Patients Receiving Chemotherapy  Today you received the following chemotherapy agents: Oxaliplatin, leucovorin, 5FU, bevacizumab  To help prevent nausea and vomiting after your treatment, we encourage you to take your nausea medication as directed.   If you develop nausea and vomiting that is not controlled by your nausea medication, call the clinic.   BELOW ARE SYMPTOMS THAT SHOULD BE REPORTED IMMEDIATELY:  *FEVER GREATER THAN 100.5 F  *CHILLS WITH OR WITHOUT FEVER  NAUSEA AND VOMITING THAT IS NOT CONTROLLED WITH YOUR NAUSEA MEDICATION  *UNUSUAL SHORTNESS OF BREATH  *UNUSUAL BRUISING OR BLEEDING  TENDERNESS IN MOUTH AND THROAT WITH OR WITHOUT PRESENCE OF ULCERS  *URINARY PROBLEMS  *BOWEL PROBLEMS  UNUSUAL RASH Items with * indicate a potential emergency and should be followed up as soon as possible.  Feel free to call the clinic should you have any questions or concerns. The clinic phone number is [phone].  Please show the [place] at check-in to the Emergency Department and triage nurse.

## 2019-09-11 ENCOUNTER — Telehealth: Payer: Self-pay | Admitting: Nurse Practitioner

## 2019-09-11 NOTE — Telephone Encounter (Signed)
Scheduled appt per 6/7 los.  Left a vm of the appt date and time.

## 2019-09-12 ENCOUNTER — Inpatient Hospital Stay: Payer: BC Managed Care – PPO

## 2019-09-12 ENCOUNTER — Other Ambulatory Visit: Payer: Self-pay

## 2019-09-12 VITALS — BP 127/52 | HR 44 | Temp 98.4°F | Resp 18

## 2019-09-12 DIAGNOSIS — Z7189 Other specified counseling: Secondary | ICD-10-CM

## 2019-09-12 DIAGNOSIS — C787 Secondary malignant neoplasm of liver and intrahepatic bile duct: Secondary | ICD-10-CM

## 2019-09-12 MED ORDER — SODIUM CHLORIDE 0.9% FLUSH
10.0000 mL | INTRAVENOUS | Status: DC | PRN
  Filled 2019-09-12: qty 10

## 2019-09-12 MED ORDER — HEPARIN SOD (PORK) LOCK FLUSH 100 UNIT/ML IV SOLN
500.0000 [IU] | Freq: Once | INTRAVENOUS | Status: DC | PRN
  Filled 2019-09-12: qty 5

## 2019-09-13 DIAGNOSIS — K645 Perianal venous thrombosis: Secondary | ICD-10-CM | POA: Diagnosis not present

## 2019-09-23 NOTE — Progress Notes (Signed)
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 Bowel-gas pattern consistent with small bowel obstruction. No free air. 2. No acute chest findings.   01/23/2019 Imaging   CT AP IMPRESSION: Obstructing mid transverse colonic mass with mild regional adenopathy and hepatic metastatic disease. The mass likely extends through the serosa; no ascites or peritoneal nodularity.   01/24/2019 Surgery   Surgeon: Clovis Riley MD Assistant: [name] Procedure performed: Transverse colectomy with end colostomy, liver biopsy Procedure classification: URGENT/EMERGENT Preop diagnosis: Obstructing, metastatic transverse colon mass Post-op diagnosis/intraop findings: Same   01/24/2019 Pathology Results   FINAL MICROSCOPIC DIAGNOSIS:   A. COLON, TRANSVERSE, RESECTION:  Colonic adenocarcinoma, 5 cm.  Carcinoma extends into pericolonic connective tissue and focally to  serosal surface.  Margins not involved.  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   B. LIVER NODULE, LEFT, BIOPSY:  Metastatic adenocarcinoma.    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 Hypermetabolic metastatic disease in the liver and mediastinal/hilar/axillary  lymph nodes. 2. Focal hypermetabolism in the rectum. Continued attention on follow-up exams is warranted. 3. Focal hypermetabolism medial to the right adrenal gland may be within a metastatic lymph node, better visualized on 01/23/2019. 4. Aortic atherosclerosis (ICD10-170.0). Coronary artery calcification.   03/12/2019 -  Chemotherapy   She started 5FU q2weeks on 03/12/19 for 2 cycles. She started full dose FOLFOX with Avastin on 04/09/19.   05/31/2019 Imaging   Restaging CT CAP IMPRESSION: 1. Similar to mild interval decrease in size of multiple hepatic lesions, partially calcified. 2. 2 mm right upper lobe pulmonary nodule. Recommend attention on follow-up. 3. Emphysema and aortic atherosclerosis.   08/23/2019 Imaging   CT CAP w contrast  IMPRESSION: 1. The dominant peripheral right liver metastasis has mildly increased. Other smaller liver metastases are stable. 2. Otherwise no new or progressive metastatic disease in the chest, abdomen or pelvis. 3. Aortic Atherosclerosis (ICD10-I70.0) and Emphysema (ICD10-J43.9).     CURRENT THERAPY: She started 5FU q2weeks on 03/12/19 for 2 cycles. She started full dose FOLFOX with Avastin on 04/09/19. Oxaliplatin reduced to 70 mg/m2 on cycle 13 due to neuropathy and renal dysfunction; to be further dose reduced to 40 mg/m2 on cycle 14 due to neuropathy   INTERVAL HISTORY: Casey Black 59 y.o. female returns to the clinic for a follow up visit. The patient is feeling well  today without any concerning complaints except for the rectal pressure/pain from her hemorrhoids. The patient recently had an appointment with her surgeon for this who prescribed cream which helps somewhat. The patient has been taking ibuprofen for discomfort. She is scheduled for a follow up with her surgeon on 10/25/2019.   At her last appointment, she endorsed new neuropathy in her feet bilaterally. She was  able to ambulate, balance, and function. Her dose of oxaliplatin was  then subsequently further reduced to 40 mg/m2 with cycle #14 of treatment. Her neuropathy is now stable-somewhat improved. She states her neuropathy is "not as bad" and has "lightened up some". She describes her neuropathy as being in her finger tips. The neuropathy that developed before cycle #14, she characterizes this as feeling cold/ice to her ankles. She states the neuropathy comes and goes. She is interested in trying gabapentin to see if it would make a difference for her neuropathy. She also was prescribed b complex vitamins for her neuropathy at her last visit but has not started taking that yet.   Otherwise, she denies any recent fevers, chills, night sweats, or recent weight loss. She denies any chest pain, cough, or shortness of breath. She denies any nausea, vomiting, diarrhea, or mucositis. She reports that her stool in her colostomy bag is hard. She takes miralax 1-2x a week. She is here for evaluation before starting cycle #15.  MEDICAL HISTORY: Past Medical History:  Diagnosis Date  . Anemia    low iron  . Cancer [facility])    colon cancer  . Colon cancer ([place]) 01/2019  . Hypertension 01/23/2019  . Personal history of chemotherapy 01/2019   colon CA  . SBO (small bowel obstruction) ([place]) 01/23/2019    ALLERGIES:  has No Known Allergies.  MEDICATIONS:  Current Outpatient Medications  Medication Sig Dispense Refill  . acetaminophen (TYLENOL) 325 MG tablet Take 2 tablets (650 mg total) by mouth every 6 (six) hours as needed.    Marland Kitchen amLODipine (NORVASC) 10 MG tablet TAKE 1 TABLET BY MOUTH EVERY DAY 30 tablet 1  . ferrous sulfate 325 (65 FE) MG tablet TAKE 1 TABLET (325 MG TOTAL) BY MOUTH 2 (TWO) TIMES DAILY WITH A MEAL. 180 tablet 1  . hydrALAZINE (APRESOLINE) 25 MG tablet TAKE 1 TABLET (25 MG TOTAL) BY MOUTH EVERY 8 (EIGHT) HOURS. 90 tablet 1  . hydrochlorothiazide (HYDRODIURIL) 12.5 MG tablet TAKE 1 TABLET BY MOUTH EVERY DAY 30 tablet 1  . hydrocortisone (ANUSOL-HC) 2.5 % rectal  cream Place 1 application rectally 2 (two) times daily. 30 g 0  . Ibuprofen (ADVIL) 200 MG CAPS Take 400 mg by mouth daily as needed (pain).    . isosorbide mononitrate (IMDUR) 30 MG 24 hr tablet TAKE 1 TABLET BY MOUTH EVERY DAY 90 tablet 0  . lidocaine (LMX) 4 % cream Apply 1 application topically 3 (three) times daily as needed. 30 g 0  . lidocaine-prilocaine (EMLA) cream Apply to affected area once (Patient taking differently: Apply 1 application topically once. Apply to affected area once) 30 g 3  . lisinopril (ZESTRIL) 40 MG tablet TAKE 1 TABLET BY MOUTH EVERY DAY 90 tablet 1  . loperamide (IMODIUM) 2 MG capsule TAKE 1 CAPSULE (2 MG TOTAL) BY MOUTH AS NEEDED FOR DIARRHEA OR LOOSE STOOLS. 90 capsule 0  . metoprolol tartrate (LOPRESSOR) 50 MG tablet TAKE 1 TABLET BY MOUTH TWICE A DAY 180 tablet 1  . metroNIDAZOLE (FLAGYL) 250 MG tablet Take 1 tablet (250 mg total) by mouth 3 (three) times daily. 1 tablet 0  . ondansetron (ZOFRAN) 4 MG tablet Take 1 tablet (4 mg total) by mouth every 6 (six) hours as needed for nausea. 30 tablet 0  . ondansetron (ZOFRAN) 8 MG tablet Take 1 tablet (8 mg total) by mouth 2 (two) times daily as needed for refractory nausea / vomiting. Start on day 3 after chemotherapy. [place]  tablet 1  . oxyCODONE-acetaminophen (PERCOCET/ROXICET) 5-325 MG tablet Take 1-2 tablets by mouth every 6 (six) hours as needed for severe pain. 20 tablet 0  . polycarbophil (FIBERCON) 625 MG tablet Take 1 tablet (625 mg total) by mouth daily. 30 tablet 0  . prochlorperazine (COMPAZINE) 10 MG tablet Take 1 tablet (10 mg total) by mouth every 6 (six) hours as needed (Nausea or vomiting). 30 tablet 1  . gabapentin (NEURONTIN) 100 MG capsule Take 1 capsule (100 mg total) by mouth at bedtime. 30 capsule 2   No current facility-administered medications for this visit.   Facility-Administered Medications Ordered in Other Visits  Medication Dose Route Frequency Provider Last Rate Last Admin  . 0.9 %   sodium chloride infusion   Intravenous Continuous Alica Shellhammer L, PA-C 20 mL/hr at 09/24/19 1304 New Bag at 09/24/19 1304  . bevacizumab-bvzr (ZIRABEV) 400 mg in sodium chloride 0.9 % 100 mL chemo infusion  5 mg/kg (Treatment Plan Recorded) Intravenous Once Truitt Merle, MD 696 mL/hr at 09/24/19 1420 400 mg at 09/24/19 1420  . dextrose 5 % solution   Intravenous Once Truitt Merle, MD      . fluorouracil (ADRUCIL) 5,000 mg in sodium chloride 0.9 % 150 mL chemo infusion  2,470 mg/m2 (Treatment Plan Recorded) Intravenous 1 day or 1 dose Truitt Merle, MD      . leucovorin 808 mg in dextrose 5 % 250 mL infusion  400 mg/m2 (Treatment Plan Recorded) Intravenous Once Truitt Merle, MD      . oxaliplatin (ELOXATIN) 80 mg in dextrose 5 % 500 mL chemo infusion  40 mg/m2 (Treatment Plan Recorded) Intravenous Once Truitt Merle, MD        SURGICAL HISTORY:  Past Surgical History:  Procedure Laterality Date  . CESAREAN SECTION     x2  . COLOSTOMY N/A 01/24/2019   Procedure: End Loop Colostomy;  Surgeon: Clovis Riley, MD;  Location: [facility];  Service: General;  Laterality: N/A;  . PARTIAL COLECTOMY N/A 01/24/2019   Procedure: PARTIAL COLECTOMY;  Surgeon: Clovis Riley, MD;  Location: [facility];  Service: General;  Laterality: N/A;  . PORTACATH PLACEMENT Right 02/28/2019   Procedure: INSERTION PORT-A-CATH WITH ULTRASOUND GUIDANCE;  Surgeon: Clovis Riley, MD;  Location: [facility];  Service: General;  Laterality: Right;    REVIEW OF SYSTEMS:   Review of Systems  Constitutional: Negative for appetite change, chills, fatigue, fever and unexpected weight change.  HENT: Negative for mouth sores, nosebleeds, sore throat and trouble swallowing.   Eyes: Negative for eye problems and icterus.  Respiratory: Negative for cough, hemoptysis, shortness of breath and wheezing.   Cardiovascular: Negative for chest pain and leg swelling.  Gastrointestinal: Positive for rectal discomfort/pressure. Negative for abdominal  pain, constipation, diarrhea, nausea and vomiting.  Genitourinary: Negative for bladder incontinence, difficulty urinating, dysuria, frequency and hematuria.   Musculoskeletal: Negative for back pain, gait problem, neck pain and neck stiffness.  Skin: Negative for itching and rash.  Neurological: Positive for stable neuropathy. Negative for dizziness, extremity weakness, gait problem, headaches, light-headedness and seizures.  Hematological: Negative for adenopathy. Does not bruise/bleed easily.  Psychiatric/Behavioral: Negative for confusion, depression and sleep disturbance. The patient is not nervous/anxious.     PHYSICAL EXAMINATION:  Blood pressure (!) 165/69, pulse 60, temperature 97.8 F (36.6 C), temperature source Temporal, resp. rate 18, height 5' 7"  (1.702 m), weight 187 lb 11.2 oz (85.1 kg), SpO2 100 %.  ECOG PERFORMANCE STATUS: 1 - Symptomatic but completely ambulatory  Physical Exam  Constitutional: Oriented to person, place, and time and well-developed, well-nourished, and in no distress.  HENT:  Head: Normocephalic and atraumatic.  Mouth/Throat: Oropharynx is clear and moist. No oropharyngeal exudate.  Eyes: Conjunctivae are normal. Right eye exhibits no discharge. Left eye exhibits no discharge. No scleral icterus.  Neck: Normal range of motion. Neck supple.  Cardiovascular: Normal rate, regular rhythm, normal heart sounds and intact distal pulses.   Pulmonary/Chest: Effort normal and breath sounds normal. No respiratory distress. No wheezes. No rales.  Abdominal: Soft. Colostomy bag in place. Bowel sounds are normal. Exhibits no distension and no mass. There is no tenderness.  Musculoskeletal: Normal range of motion. Exhibits no edema.  Lymphadenopathy:    No cervical adenopathy.  Neurological: Alert and oriented to person, place, and time. Exhibits normal muscle tone. Gait normal. Coordination normal.  Skin: Skin is warm and dry. No rash noted. Not diaphoretic. No  erythema. No pallor.  Psychiatric: Mood, memory and judgment normal.  Vitals reviewed.  LABORATORY DATA: Lab Results  Component Value Date   WBC 3.6 (L) 09/24/2019   HGB 9.4 (L) 09/24/2019   HCT 29.5 (L) 09/24/2019   MCV 97.0 09/24/2019   PLT 132 (L) 09/24/2019      Chemistry      Component Value Date/Time   NA 141 09/24/2019 1145   K 4.7 09/24/2019 1145   CL 114 (H) 09/24/2019 1145   CO2 19 (L) 09/24/2019 1145   BUN 25 (H) 09/24/2019 1145   CREATININE 1.29 (H) 09/24/2019 1145      Component Value Date/Time   CALCIUM 9.4 09/24/2019 1145   ALKPHOS 80 09/24/2019 1145   AST 20 09/24/2019 1145   ALT 23 09/24/2019 1145   BILITOT 0.2 (L) 09/24/2019 1145       RADIOGRAPHIC STUDIES:  No results found.   ASSESSMENT/PLAN:  TYWANNA SEIFER a 61X.o.femalewith   1. Adenocarcinoma of transverse colon, moderately differentiated, pT4aN1aM1a stage IV with liverand nodalmetastasis, MMR normal  -Diagnosed in 01/2019 afteremergent colectomy and liver biopsy. 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  -PET from 02/26/19 showsknown liver metastasis and metastatic lymphadenopathy in chest and right axilla.  -[name] shows MSI stable disease, Kras+ No targetable mutation, she is not a candidate for EGFR or immunotherapy. -Began palliative first line chemo on 03/12/2019, received 5FU/leuc with first 2 cycles forlarge open abdominal wound after surgery. She started full dose FOLFOX and avastin with cycle 2, s/p13cycles of treatment -Interim scans have been stable overall  -She developed CIPN after cycle 12, oxali dose reduced to 70 mg/m2 with cycle 13. Symptoms progressed and further reduced to 40 mg/m2 from cycle 14  -Peripheral neuropathy stable to somewhat improved today.  -Labs were reviewed. Mild renal insufficiency and ANC 1.5. Hemoglobin stable to mildly decreased. Adequate to proceed with cycle #15 today as scheduled with reduced dose  oxaliplatin.  -Follow up in 2 weeks with cycle #16.   2. Rectal pain, Hemorrhoids, Constipation  -She has had mildly bleeding external hemorrhoids lately, which has caused her pain.  -She saw Dr. Kae Heller recently who prescribed her cream. This helped somewhat but she still is having pain.  -Dr. Burr Medico previously discussed she can see GI for possible hemorrhoids banding.  -Due to rectal pain flare she went to ED on 5/16 and 5/20. She was given percocet which made her very drowsy and constipated. Dr. Burr Medico discussed percocet is too strong for this and she should start OTC Tylenol -Today,  09/24/19, the patient inquired about stronger pain medication. I re-discussed that may have unwanted S/E and worsen her constipation. I would not recommend. Encouraged her to use tylenol instead of ibuprofen. Also strongly encouraged her to reach out to her gastroenterologist or Dr. Kae Heller if she feels like she needs to be sooner or if her symptoms worsen.  -She takes miralax for her hard stool. Also encouraged her to use stool softener if needed as well as exercise as tolerated, drink plenty of fluid, and eat fruits/veggies.   3. Chemotherapy induced peripheral neuropathy (CIPN), G2 -secondary to Oxaliplatin, developed after cycle 12 FOLFOX -initially began in her fingertips, cycle 13 Oxali dose reduced to 70 mg/m2 -she has progressive neuropathy in fingertips and new neuropathy in her feet since last cycle. She has moderate sensory deficit in her feet but still able to function and ambulate well. No pain or sensitivity  -Oxaliplatin further to 40 mg/m2 from cycle 14. It was discussed that this is a known side effect and is a dose-limiting SE of Oxaliplatin. We will likely have to stop it soon. She understands.  -Today 09/24/19, her symptoms are stable. Will continue on reduced oxaliplatin for now but also re-discussed this may be discontinued soon.  -She is interested in trying gabapentin to see if it improves her  symptoms. I will send a prescription for 100 mg p.o. at bedtime. Can titrate up at future appointments.   4.Nutrition/Anorexia  -she reportedly weighed 253 lbs a few years ago, and weighed 203 lbs at symptom onset~11/2018 -190 lbsin 02/2019.  -Continue to f/u withdietician -she has been able to gain weight on chemo and stable lately -continue monitoring   5. Iron deficiency anemia, and anemia secondary to cancer -She had low ferritinand low TIBC -IDA responding to ferrous sulfate BID -iron studies remain normal, continue monitoring   6. HTN -Initiallyon hydralazine, isosorbide, lisinopril, metoprolol. -Avastin and anxiety contributing to BP elevation in clinic. Received clonidine PRN on chemo days.  -Recently added amlodipine and HCTZ with improvement. Tolerating well without sx of hypotension -09/24/19 patient took BP meds just before her appointment today. BP initially 165/69. Upon recheck 146/59. No clonidine given today.   7. Social support -She worked in [employer] at 2 different jobs, but has been out of work lately due to her symptoms  -She is not getting disability, no pay, but her employer kept her on their insurance plan -has been referred to SW to discuss financial option/resources  8. Goals of care discussion -Goal is palliative, to control disease and prolong her life. -It has previously beendiscussed thather cancer is not curable at this stage, and that we will continue treatment as long as she can tolerate and her disease is controlled.she understands her treatment plan will evolve depending on her response and side effects of treatment.  -Full code  Plan: -Continue on cycle #15 today as scheduled with reduced dose oxaliplatin 40 mg/m2. -100 mg p.o. at bedtime for peripheral neuropathy. Can titrate up if needed at future appointments.  -Encouraged patient to reach out to Dr. Kae Heller or GI for rectal pain for consideration of alternative/further  recommendations for therapy. -Discussed constipation education -Encouraged tylenol for rectal pain.   No orders of the defined types were placed in this encounter.    Costa Jha L Jadis Pitter, PA-C 09/24/19

## 2019-09-24 ENCOUNTER — Other Ambulatory Visit: Payer: Self-pay | Admitting: Nurse Practitioner

## 2019-09-24 ENCOUNTER — Inpatient Hospital Stay (HOSPITAL_BASED_OUTPATIENT_CLINIC_OR_DEPARTMENT_OTHER): Payer: BC Managed Care – PPO | Admitting: Physician Assistant

## 2019-09-24 ENCOUNTER — Inpatient Hospital Stay: Payer: BC Managed Care – PPO | Admitting: Hematology

## 2019-09-24 ENCOUNTER — Inpatient Hospital Stay: Payer: BC Managed Care – PPO

## 2019-09-24 ENCOUNTER — Other Ambulatory Visit: Payer: Self-pay

## 2019-09-24 VITALS — BP 165/69 | HR 60 | Temp 97.8°F | Resp 18 | Ht 67.0 in | Wt 187.7 lb

## 2019-09-24 VITALS — BP 147/59 | HR 40

## 2019-09-24 DIAGNOSIS — C189 Malignant neoplasm of colon, unspecified: Secondary | ICD-10-CM | POA: Diagnosis not present

## 2019-09-24 DIAGNOSIS — D509 Iron deficiency anemia, unspecified: Secondary | ICD-10-CM | POA: Diagnosis not present

## 2019-09-24 DIAGNOSIS — G620 Drug-induced polyneuropathy: Secondary | ICD-10-CM

## 2019-09-24 DIAGNOSIS — Z95828 Presence of other vascular implants and grafts: Secondary | ICD-10-CM

## 2019-09-24 DIAGNOSIS — C778 Secondary and unspecified malignant neoplasm of lymph nodes of multiple regions: Secondary | ICD-10-CM | POA: Diagnosis not present

## 2019-09-24 DIAGNOSIS — C184 Malignant neoplasm of transverse colon: Secondary | ICD-10-CM | POA: Diagnosis not present

## 2019-09-24 DIAGNOSIS — C787 Secondary malignant neoplasm of liver and intrahepatic bile duct: Secondary | ICD-10-CM | POA: Diagnosis not present

## 2019-09-24 DIAGNOSIS — Z5111 Encounter for antineoplastic chemotherapy: Secondary | ICD-10-CM | POA: Diagnosis not present

## 2019-09-24 DIAGNOSIS — C773 Secondary and unspecified malignant neoplasm of axilla and upper limb lymph nodes: Secondary | ICD-10-CM | POA: Diagnosis not present

## 2019-09-24 DIAGNOSIS — T451X5A Adverse effect of antineoplastic and immunosuppressive drugs, initial encounter: Secondary | ICD-10-CM | POA: Diagnosis not present

## 2019-09-24 DIAGNOSIS — D630 Anemia in neoplastic disease: Secondary | ICD-10-CM | POA: Diagnosis not present

## 2019-09-24 DIAGNOSIS — Z5112 Encounter for antineoplastic immunotherapy: Secondary | ICD-10-CM | POA: Diagnosis not present

## 2019-09-24 DIAGNOSIS — Z7189 Other specified counseling: Secondary | ICD-10-CM

## 2019-09-24 LAB — CBC WITH DIFFERENTIAL (CANCER CENTER ONLY)
Abs Immature Granulocytes: 0.01 10*3/uL (ref 0.00–0.07)
Basophils Absolute: 0 10*3/uL (ref 0.0–0.1)
Basophils Relative: 1 %
Eosinophils Absolute: 0.1 10*3/uL (ref 0.0–0.5)
Eosinophils Relative: 3 %
HCT: 29.5 % — ABNORMAL LOW (ref 36.0–46.0)
Hemoglobin: 9.4 g/dL — ABNORMAL LOW (ref 12.0–15.0)
Immature Granulocytes: 0 %
Lymphocytes Relative: 42 %
Lymphs Abs: 1.5 10*3/uL (ref 0.7–4.0)
MCH: 30.9 pg (ref 26.0–34.0)
MCHC: 31.9 g/dL (ref 30.0–36.0)
MCV: 97 fL (ref 80.0–100.0)
Monocytes Absolute: 0.5 10*3/uL (ref 0.1–1.0)
Monocytes Relative: 13 %
Neutro Abs: 1.5 10*3/uL — ABNORMAL LOW (ref 1.7–7.7)
Neutrophils Relative %: 41 %
Platelet Count: 132 10*3/uL — ABNORMAL LOW (ref 150–400)
RBC: 3.04 MIL/uL — ABNORMAL LOW (ref 3.87–5.11)
RDW: 17.8 % — ABNORMAL HIGH (ref 11.5–15.5)
WBC Count: 3.6 10*3/uL — ABNORMAL LOW (ref 4.0–10.5)
nRBC: 0.6 % — ABNORMAL HIGH (ref 0.0–0.2)

## 2019-09-24 LAB — CMP (CANCER CENTER ONLY)
ALT: 23 U/L (ref 0–44)
AST: 20 U/L (ref 15–41)
Albumin: 3.4 g/dL — ABNORMAL LOW (ref 3.5–5.0)
Alkaline Phosphatase: 80 U/L (ref 38–126)
Anion gap: 8 (ref 5–15)
BUN: 25 mg/dL — ABNORMAL HIGH (ref 6–20)
CO2: 19 mmol/L — ABNORMAL LOW (ref 22–32)
Calcium: 9.4 mg/dL (ref 8.9–10.3)
Chloride: 114 mmol/L — ABNORMAL HIGH (ref 98–111)
Creatinine: 1.29 mg/dL — ABNORMAL HIGH (ref 0.44–1.00)
GFR, Est AFR Am: 52 mL/min — ABNORMAL LOW (ref >60)
GFR, Estimated: 45 mL/min — ABNORMAL LOW (ref >60)
Glucose, Bld: 73 mg/dL (ref 70–99)
Potassium: 4.7 mmol/L (ref 3.5–5.1)
Sodium: 141 mmol/L (ref 135–145)
Total Bilirubin: 0.2 mg/dL — ABNORMAL LOW (ref 0.3–1.2)
Total Protein: 7.1 g/dL (ref 6.5–8.1)

## 2019-09-24 LAB — TOTAL PROTEIN, URINE DIPSTICK: Protein, ur: 30 mg/dL — AB

## 2019-09-24 MED ORDER — PALONOSETRON HCL INJECTION 0.25 MG/5ML
INTRAVENOUS | Status: AC
  Filled 2019-09-24: qty 5

## 2019-09-24 MED ORDER — LEUCOVORIN CALCIUM INJECTION 350 MG
400.0000 mg/m2 | Freq: Once | INTRAVENOUS | Status: AC
  Administered 2019-09-24: 808 mg via INTRAVENOUS
  Filled 2019-09-24: qty 40.4

## 2019-09-24 MED ORDER — SODIUM CHLORIDE 0.9 % IV SOLN
5.0000 mg/kg | Freq: Once | INTRAVENOUS | Status: AC
  Administered 2019-09-24: 400 mg via INTRAVENOUS
  Filled 2019-09-24: qty 16

## 2019-09-24 MED ORDER — GABAPENTIN 100 MG PO CAPS: 100.0000 mg | ORAL_CAPSULE | Freq: Every day | ORAL | 2 refills | Status: DC

## 2019-09-24 MED ORDER — SODIUM CHLORIDE 0.9 % IV SOLN
2470.0000 mg/m2 | INTRAVENOUS | Status: DC
  Administered 2019-09-24: 5000 mg via INTRAVENOUS
  Filled 2019-09-24: qty 100

## 2019-09-24 MED ORDER — SODIUM CHLORIDE 0.9 % IV SOLN
INTRAVENOUS | Status: DC
  Filled 2019-09-24: qty 250

## 2019-09-24 MED ORDER — SODIUM CHLORIDE 0.9% FLUSH
10.0000 mL | INTRAVENOUS | Status: DC | PRN
  Administered 2019-09-24: 10 mL
  Filled 2019-09-24: qty 10

## 2019-09-24 MED ORDER — PALONOSETRON HCL INJECTION 0.25 MG/5ML
0.2500 mg | Freq: Once | INTRAVENOUS | Status: AC
  Administered 2019-09-24: 0.25 mg via INTRAVENOUS

## 2019-09-24 MED ORDER — OXALIPLATIN CHEMO INJECTION 100 MG/20ML
40.0000 mg/m2 | Freq: Once | INTRAVENOUS | Status: AC
  Administered 2019-09-24: 80 mg via INTRAVENOUS
  Filled 2019-09-24: qty 16

## 2019-09-24 MED ORDER — DEXTROSE 5 % IV SOLN
Freq: Once | INTRAVENOUS | Status: AC
  Filled 2019-09-24: qty 250

## 2019-09-24 MED ORDER — SODIUM CHLORIDE 0.9 % IV SOLN
10.0000 mg | Freq: Once | INTRAVENOUS | Status: AC
  Administered 2019-09-24: 10 mg via INTRAVENOUS
  Filled 2019-09-24: qty 10

## 2019-09-24 NOTE — Patient Instructions (Signed)
Casey Black Discharge Instructions for Patients Receiving Chemotherapy  Today you received the following chemotherapy agents Bevacizumab (ZIRABEV), Oxaliplatin (ELOXATIN), Leucovorin & Flourouracil (ADRUCIL).  To help prevent nausea and vomiting after your treatment, we encourage you to take your nausea medication as prescribed.   If you develop nausea and vomiting that is not controlled by your nausea medication, call the clinic.   BELOW ARE SYMPTOMS THAT SHOULD BE REPORTED IMMEDIATELY:  *FEVER GREATER THAN 100.5 F  *CHILLS WITH OR WITHOUT FEVER  NAUSEA AND VOMITING THAT IS NOT CONTROLLED WITH YOUR NAUSEA MEDICATION  *UNUSUAL SHORTNESS OF BREATH  *UNUSUAL BRUISING OR BLEEDING  TENDERNESS IN MOUTH AND THROAT WITH OR WITHOUT PRESENCE OF ULCERS  *URINARY PROBLEMS  *BOWEL PROBLEMS  UNUSUAL RASH Items with * indicate a potential emergency and should be followed up as soon as possible.  Feel free to call the clinic should you have any questions or concerns. The clinic phone number is [phone].  Please show the [facility] at check-in to the Emergency Department and triage nurse.

## 2019-09-25 DIAGNOSIS — H5203 Hypermetropia, bilateral: Secondary | ICD-10-CM | POA: Diagnosis not present

## 2019-09-26 ENCOUNTER — Other Ambulatory Visit: Payer: Self-pay

## 2019-09-26 ENCOUNTER — Inpatient Hospital Stay: Payer: BC Managed Care – PPO

## 2019-09-26 VITALS — BP 151/60 | HR 47 | Temp 98.8°F | Resp 18

## 2019-09-26 DIAGNOSIS — D509 Iron deficiency anemia, unspecified: Secondary | ICD-10-CM | POA: Diagnosis not present

## 2019-09-26 DIAGNOSIS — C787 Secondary malignant neoplasm of liver and intrahepatic bile duct: Secondary | ICD-10-CM

## 2019-09-26 DIAGNOSIS — C773 Secondary and unspecified malignant neoplasm of axilla and upper limb lymph nodes: Secondary | ICD-10-CM | POA: Diagnosis not present

## 2019-09-26 DIAGNOSIS — C184 Malignant neoplasm of transverse colon: Secondary | ICD-10-CM | POA: Diagnosis not present

## 2019-09-26 DIAGNOSIS — C778 Secondary and unspecified malignant neoplasm of lymph nodes of multiple regions: Secondary | ICD-10-CM | POA: Diagnosis not present

## 2019-09-26 DIAGNOSIS — G620 Drug-induced polyneuropathy: Secondary | ICD-10-CM | POA: Diagnosis not present

## 2019-09-26 DIAGNOSIS — Z7189 Other specified counseling: Secondary | ICD-10-CM

## 2019-09-26 DIAGNOSIS — D630 Anemia in neoplastic disease: Secondary | ICD-10-CM | POA: Diagnosis not present

## 2019-09-26 DIAGNOSIS — Z5112 Encounter for antineoplastic immunotherapy: Secondary | ICD-10-CM | POA: Diagnosis not present

## 2019-09-26 DIAGNOSIS — Z5111 Encounter for antineoplastic chemotherapy: Secondary | ICD-10-CM | POA: Diagnosis not present

## 2019-09-26 MED ORDER — SODIUM CHLORIDE 0.9% FLUSH
10.0000 mL | INTRAVENOUS | Status: DC | PRN
  Administered 2019-09-26: 10 mL
  Filled 2019-09-26: qty 10

## 2019-09-26 MED ORDER — HEPARIN SOD (PORK) LOCK FLUSH 100 UNIT/ML IV SOLN
500.0000 [IU] | Freq: Once | INTRAVENOUS | Status: AC | PRN
  Administered 2019-09-26: 500 [IU]
  Filled 2019-09-26: qty 5

## 2019-09-26 NOTE — Progress Notes (Signed)
Patient had 11.1,l left in the bag, Amy, RN boluses 15ml and threw away the rest per the patients request.

## 2019-09-26 NOTE — Patient Instructions (Signed)

## 2019-09-28 ENCOUNTER — Other Ambulatory Visit: Payer: Self-pay | Admitting: Hematology

## 2019-09-28 ENCOUNTER — Other Ambulatory Visit: Payer: Self-pay | Admitting: Internal Medicine

## 2019-10-08 ENCOUNTER — Other Ambulatory Visit: Payer: Self-pay | Admitting: Hematology

## 2019-10-08 NOTE — Progress Notes (Signed)
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 Bowel-gas pattern consistent with small bowel obstruction. No free air. 2. No acute chest findings.   01/23/2019 Imaging   CT AP IMPRESSION: Obstructing mid transverse colonic mass with mild regional adenopathy and hepatic metastatic disease. The mass likely extends through the serosa; no ascites or peritoneal nodularity.   01/24/2019 Surgery   Surgeon: Chelsea A Connor MD Assistant: Jessica Focht PA-C Procedure performed: Transverse colectomy with end colostomy, liver biopsy Procedure classification: URGENT/EMERGENT Preop diagnosis: Obstructing, metastatic transverse colon mass Post-op diagnosis/intraop findings: Same   01/24/2019 Pathology Results   FINAL MICROSCOPIC DIAGNOSIS:   A. COLON, TRANSVERSE, RESECTION:  Colonic adenocarcinoma, 5 cm.  Carcinoma extends into pericolonic connective tissue and focally to  serosal surface.  Margins not involved.  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   B. LIVER NODULE, LEFT, BIOPSY:  Metastatic adenocarcinoma.    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 Hypermetabolic metastatic disease in the liver and mediastinal/hilar/axillary lymph nodes. 2. Focal hypermetabolism in the rectum. Continued attention on follow-up exams is warranted. 3. Focal hypermetabolism medial to the right adrenal gland may be within a metastatic lymph node, better visualized on 01/23/2019. 4. Aortic atherosclerosis (ICD10-170.0). Coronary artery calcification.   03/12/2019 -  Chemotherapy   She started 5FU q2weeks on 03/12/19 for 2 cycles. She started full dose FOLFOX with Avastin on 04/09/19.   05/31/2019 Imaging   Restaging CT CAP IMPRESSION: 1. Similar to mild interval decrease in size of multiple hepatic lesions, partially calcified. 2. 2 mm right upper lobe pulmonary nodule. Recommend attention on follow-up. 3. Emphysema and aortic atherosclerosis.   08/23/2019 Imaging   CT CAP w contrast  IMPRESSION: 1. The dominant peripheral right liver metastasis has mildly increased. Other smaller liver metastases are stable. 2. Otherwise no new or progressive metastatic disease in the chest, abdomen or pelvis. 3. Aortic Atherosclerosis (ICD10-I70.0) and Emphysema (ICD10-J43.9).     CURRENT THERAPY: She started 5FU q2weeks on 03/12/19 for 2 cycles. She started full dose FOLFOX with Avastin on 04/09/19.Oxaliplatin reduced to 70 mg/m2 on cycle 13 due to neuropathy and renal dysfunction; further dose reduced to 40 mg/m2 on cycle 14 due to neuropathy  INTERVAL HISTORY: Ms Alper returns for f/u and treatment as scheduled. She completed cycle 15 dose-reduced FOLFOX/Avastin on 09/24/19.  Her only complaint is neuropathy which is getting worse in her feet, occasionally affecting her balance.  Hands are "not that bad."  She began gabapentin 100 mg at   night, not  sure if it is working because she goes to sleep.  Otherwise, cold sensitivity lasts 1-1.5 weeks but feet feel cold all the time. Fatigue lasts a week then resolves.  P.o. intake is adequate.  Denies mucositis.  Manages constipation with MiraLAX and Colace; denies nausea, vomiting, diarrhea.  Hemorrhoids are not bothering her currently, has appointment with Dr. Connors on 7/22.  Denies bleeding.  Denies pain, fever, chills, cough, shortness of breath, dyspnea.   MEDICAL HISTORY:  Past Medical History:  Diagnosis Date  . Anemia    low iron  . Cancer (HCC)    colon cancer  . Colon cancer (HCC) 01/2019  . Hypertension 01/23/2019  . Personal history of chemotherapy 01/2019   colon CA  . SBO (small bowel obstruction) (HCC) 01/23/2019    SURGICAL HISTORY: Past Surgical History:  Procedure Laterality Date  . CESAREAN SECTION     x2  . COLOSTOMY N/A 01/24/2019   Procedure: End Loop Colostomy;  Surgeon: Connor, Chelsea A, MD;  Location: [facility] OR;  Service: General;  Laterality: N/A;  . PARTIAL COLECTOMY N/A 01/24/2019   Procedure: PARTIAL COLECTOMY;  Surgeon: Connor, Chelsea A, MD;  Location: [facility] OR;  Service: General;  Laterality: N/A;  . PORTACATH PLACEMENT Right 02/28/2019   Procedure: INSERTION PORT-A-CATH WITH ULTRASOUND GUIDANCE;  Surgeon: Connor, Chelsea A, MD;  Location: [facility] OR;  Service: General;  Laterality: Right;    I have reviewed the social history and family history with the patient and they are unchanged from previous note.  ALLERGIES:  has No Known Allergies.  MEDICATIONS:  Current Outpatient Medications  Medication Sig Dispense Refill  . acetaminophen (TYLENOL) 325 MG tablet Take 2 tablets (650 mg total) by mouth every 6 (six) hours as needed.    . amLODipine (NORVASC) 10 MG tablet TAKE 1 TABLET BY MOUTH EVERY DAY 30 tablet 1  . ferrous sulfate 325 (65 FE) MG tablet TAKE 1 TABLET (325 MG TOTAL) BY MOUTH 2 (TWO) TIMES DAILY WITH A MEAL. 180 tablet 1  . gabapentin (NEURONTIN)  100 MG capsule Take 1 capsule (100 mg total) by mouth at bedtime. 30 capsule 2  . hydrALAZINE (APRESOLINE) 25 MG tablet TAKE 1 TABLET (25 MG TOTAL) BY MOUTH EVERY 8 (EIGHT) HOURS. 90 tablet 1  . hydrochlorothiazide (HYDRODIURIL) 12.5 MG tablet TAKE 1 TABLET BY MOUTH EVERY DAY 30 tablet 1  . hydrocortisone (ANUSOL-HC) 2.5 % rectal cream Place 1 application rectally 2 (two) times daily. 30 g 0  . Ibuprofen (ADVIL) 200 MG CAPS Take 400 mg by mouth daily as needed (pain).    . isosorbide mononitrate (IMDUR) 30 MG 24 hr tablet TAKE 1 TABLET BY MOUTH EVERY DAY 90 tablet 0  . lidocaine (LMX) 4 % cream Apply 1 application topically 3 (three) times daily as needed. 30 g 0  . lidocaine-prilocaine (EMLA) cream Apply to affected area once (Patient taking differently: Apply 1 application topically once. Apply to affected area once) 30 g 3  . lisinopril (ZESTRIL) 40 MG tablet TAKE 1 TABLET BY MOUTH EVERY DAY 90 tablet 1  . loperamide (IMODIUM) 2 MG capsule TAKE 1 CAPSULE (2 MG TOTAL) BY MOUTH AS NEEDED FOR DIARRHEA OR LOOSE STOOLS. 90 capsule 0  . metoprolol tartrate (LOPRESSOR) 50 MG tablet TAKE 1 TABLET BY MOUTH TWICE A DAY 180 tablet 1  . metroNIDAZOLE (FLAGYL) 250 MG tablet Take 1 tablet (250 mg total) by mouth 3 (three) times daily. 1 tablet 0  . ondansetron (ZOFRAN) 4 MG   tablet Take 1 tablet (4 mg total) by mouth every 6 (six) hours as needed for nausea. 30 tablet 0  . ondansetron (ZOFRAN) 8 MG tablet Take 1 tablet (8 mg total) by mouth 2 (two) times daily as needed for refractory nausea / vomiting. Start on day 3 after chemotherapy. 30 tablet 1  . oxyCODONE-acetaminophen (PERCOCET/ROXICET) 5-325 MG tablet Take 1-2 tablets by mouth every 6 (six) hours as needed for severe pain. 20 tablet 0  . polycarbophil (FIBERCON) 625 MG tablet Take 1 tablet (625 mg total) by mouth daily. 30 tablet 0  . prochlorperazine (COMPAZINE) 10 MG tablet Take 1 tablet (10 mg total) by mouth every 6 (six) hours as needed (Nausea or  vomiting). 30 tablet 1   No current facility-administered medications for this visit.   Facility-Administered Medications Ordered in Other Visits  Medication Dose Route Frequency Provider Last Rate Last Admin  . 0.9 %  sodium chloride infusion   Intravenous Continuous Feng, Yan, MD   Stopped at 10/09/19 1357  . fluorouracil (ADRUCIL) 5,000 mg in sodium chloride 0.9 % 150 mL chemo infusion  2,470 mg/m2 (Treatment Plan Recorded) Intravenous 1 day or 1 dose Feng, Yan, MD   5,000 mg at 10/09/19 1358    PHYSICAL EXAMINATION: ECOG PERFORMANCE STATUS: 1 - Symptomatic but completely ambulatory  Vitals:   10/09/19 1109  BP: (!) 159/64  Pulse: 60  Resp: 18  Temp: 98.2 F (36.8 C)  SpO2: 100%   Filed Weights   10/09/19 1109  Weight: 191 lb 14.4 oz (87 kg)    GENERAL:alert, no distress and comfortable SKIN: no rash to exposed skin. Palms with hyperpigmentation  EYES:  sclera clear LUNGS:  normal breathing effort HEART:  no lower extremity edema NEURO: alert & oriented x 3 with fluent speech, steady gait PAC without erythema   LABORATORY DATA:  I have reviewed the data as listed CBC Latest Ref Rng & Units 10/09/2019 09/24/2019 09/10/2019  WBC 4.0 - 10.5 K/uL 4.0 3.6(L) 4.1  Hemoglobin 12.0 - 15.0 g/dL 9.8(L) 9.4(L) 9.7(L)  Hematocrit 36 - 46 % 30.5(L) 29.5(L) 30.7(L)  Platelets 150 - 400 K/uL 136(L) 132(L) 125(L)     CMP Latest Ref Rng & Units 10/09/2019 09/24/2019 09/10/2019  Glucose 70 - 99 mg/dL 79 73 90  BUN 6 - 20 mg/dL 19 25(H) 24(H)  Creatinine 0.44 - 1.00 mg/dL 1.06(H) 1.29(H) 0.92  Sodium 135 - 145 mmol/L 138 141 140  Potassium 3.5 - 5.1 mmol/L 4.0 4.7 4.9  Chloride 98 - 111 mmol/L 110 114(H) 111  CO2 22 - 32 mmol/L 20(L) 19(L) 19(L)  Calcium 8.9 - 10.3 mg/dL 9.8 9.4 9.9  Total Protein 6.5 - 8.1 g/dL 7.4 7.1 7.2  Total Bilirubin 0.3 - 1.2 mg/dL 0.3 0.2(L) 0.2(L)  Alkaline Phos 38 - 126 U/L 138(H) 80 98  AST 15 - 41 U/L 54(H) 20 25  ALT 0 - 44 U/L 63(H) 23 25       RADIOGRAPHIC STUDIES: I have personally reviewed the radiological images as listed and agreed with the findings in the report. No results found.   ASSESSMENT & PLAN: Paxtyn K Safranis a 58y.o.femalewith   1. Adenocarcinoma of transverse colon, moderately differentiated, pT4aN1aM1a stage IV with liverand nodalmetastasis, MMR normal  -Diagnosed in 01/2019 afteremergent colectomy and liver biopsy. 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  -PET from 02/26/19 showsknown liver metastasis and metastatic lymphadenopathy in chest and right axilla.  -[name] shows MSI stable disease, Kras+   No targetable mutation, she is not a candidate for EGFR or immunotherapy. -Began palliative first line chemo on 03/12/2019, received 5FU/leuc with first 2 cycles forlarge open abdominal wound after surgery. She started full dose FOLFOX and avastin with cycle 2, s/p15cycles of treatment -Interim scans have been stable overall  -She developed CIPN after cycle 12, oxali dose reduced to 70 mg/m2 with cycle 13. Symptoms progressed and further reduced to 40 mg/m2 from cycle 14 and 15 -She has progressive neuropathy today, otherwise tolerating treatment well. No new symptoms or side effects. Plan to continue 5FU/leuc and avastin today, hold oxali  2. Chemotherapy induced peripheral neuropathy (CIPN), G2 -secondary to Oxaliplatin, developed after cycle 12 FOLFOX -initially began in her fingertips, cycle 13 Oxali dose reduced to 70 mg/m2 -she has progressive neuropathy in fingertips and now in feet since cycle 14. She has moderate sensory deficit in her feet but still able to function and ambulate well. No pain or sensitivity  -Oxaliplatin further dose reduced to 40 mg/m2 cycle 14.  -persistent for cycle 15, dose continued 40 mg/m2 and initiated gabapentin  -progressive now, Oxaliplatin is being held   3.Nutrition/Anorexia  -she reportedly weighed 253 lbs a few years  ago, and weighed 203 lbs at symptom onset~11/2018 -190 lbsin 02/2019.  -Continue to f/u withdietician -she has been able to gain weight on chemo and stable lately -continue monitoring   4. Iron deficiency anemia, and anemia secondary to cancer -She had low ferritinand low TIBC -IDA responding to ferrous sulfate BID -iron studies remain normal, continue monitoring   5. HTN -Initiallyon hydralazine, isosorbide, lisinopril, metoprolol. -Avastin and anxiety contributing to BP elevation in clinic. Received clonidine PRN on chemo days.  -Recently added amlodipine and HCTZ with improvement. Tolerating well without sx of hypotension  6. Social support -She worked in [employer] at 2 different jobs, but has been out of work lately due to her symptoms  -She is not getting disability, no pay, but her employer kept her on their insurance plan -has been referred to SW to discuss financial option/resources  7. Goals of care discussion -Goal is palliative, to control disease and prolong her life. -wehavediscussedher cancer is not curable at this stage, and that we will continue treatment as long as she can tolerate and her disease is controlled.she understands her treatment plan will evolve depending on her response and side effects of treatment.  -Full code   Disposition:  Ms. Seiple appears stable. She completed cycle 15 FOLFOX, first oxaliplatin dose reduction with cycle 13 then again with cycles 14/15. She tolerates treatment well except progressive neuropathy which, unfortunately, has worsened despite two dose reductions and adding gabapentin. She otherwise tolerates treatment well.   CBC is stable. CMP shows improved renal function. Slightly elevated LFTs, likely from increased tylenol use for hemorrhoids. I urged her to limit tylenol which she will do now that pain has improved. She will f/u with surgery on 10/25/19. Labs adequate for treatment.   She will proceed with  cycle 16 treatment today, 5FU/leuc and avastin. I will hold Oxaliplatin given her progressive neuropathy. She will increase gabapentin to 100 mg AM and continue 100 mg qHS. She will return for f/u and next cycle in 3 weeks to attend a family trip the week of 7/19.   All questions were answered. The patient knows to call the clinic with any problems, questions or concerns. No barriers to learning was detected.     [name], NP 10/09/19

## 2019-10-09 ENCOUNTER — Inpatient Hospital Stay: Payer: BC Managed Care – PPO

## 2019-10-09 ENCOUNTER — Inpatient Hospital Stay (HOSPITAL_BASED_OUTPATIENT_CLINIC_OR_DEPARTMENT_OTHER): Payer: BC Managed Care – PPO | Admitting: Nurse Practitioner

## 2019-10-09 ENCOUNTER — Inpatient Hospital Stay: Payer: BC Managed Care – PPO | Attending: Nurse Practitioner

## 2019-10-09 ENCOUNTER — Other Ambulatory Visit: Payer: Self-pay

## 2019-10-09 ENCOUNTER — Encounter: Payer: Self-pay | Admitting: Nurse Practitioner

## 2019-10-09 VITALS — BP 159/64 | HR 60 | Temp 98.2°F | Resp 18 | Ht 67.0 in | Wt 191.9 lb

## 2019-10-09 VITALS — BP 146/53 | HR 49 | Resp 18

## 2019-10-09 DIAGNOSIS — Z452 Encounter for adjustment and management of vascular access device: Secondary | ICD-10-CM | POA: Insufficient documentation

## 2019-10-09 DIAGNOSIS — D509 Iron deficiency anemia, unspecified: Secondary | ICD-10-CM | POA: Insufficient documentation

## 2019-10-09 DIAGNOSIS — C773 Secondary and unspecified malignant neoplasm of axilla and upper limb lymph nodes: Secondary | ICD-10-CM | POA: Diagnosis not present

## 2019-10-09 DIAGNOSIS — C184 Malignant neoplasm of transverse colon: Secondary | ICD-10-CM | POA: Diagnosis not present

## 2019-10-09 DIAGNOSIS — G620 Drug-induced polyneuropathy: Secondary | ICD-10-CM

## 2019-10-09 DIAGNOSIS — C787 Secondary malignant neoplasm of liver and intrahepatic bile duct: Secondary | ICD-10-CM

## 2019-10-09 DIAGNOSIS — Z5112 Encounter for antineoplastic immunotherapy: Secondary | ICD-10-CM | POA: Insufficient documentation

## 2019-10-09 DIAGNOSIS — C189 Malignant neoplasm of colon, unspecified: Secondary | ICD-10-CM

## 2019-10-09 DIAGNOSIS — Z5111 Encounter for antineoplastic chemotherapy: Secondary | ICD-10-CM | POA: Insufficient documentation

## 2019-10-09 DIAGNOSIS — T451X5A Adverse effect of antineoplastic and immunosuppressive drugs, initial encounter: Secondary | ICD-10-CM

## 2019-10-09 DIAGNOSIS — Z7189 Other specified counseling: Secondary | ICD-10-CM

## 2019-10-09 DIAGNOSIS — D630 Anemia in neoplastic disease: Secondary | ICD-10-CM | POA: Diagnosis not present

## 2019-10-09 LAB — CBC WITH DIFFERENTIAL (CANCER CENTER ONLY)
Abs Immature Granulocytes: 0.01 10*3/uL (ref 0.00–0.07)
Basophils Absolute: 0 10*3/uL (ref 0.0–0.1)
Basophils Relative: 1 %
Eosinophils Absolute: 0.1 10*3/uL (ref 0.0–0.5)
Eosinophils Relative: 3 %
HCT: 30.5 % — ABNORMAL LOW (ref 36.0–46.0)
Hemoglobin: 9.8 g/dL — ABNORMAL LOW (ref 12.0–15.0)
Immature Granulocytes: 0 %
Lymphocytes Relative: 40 %
Lymphs Abs: 1.6 10*3/uL (ref 0.7–4.0)
MCH: 31.9 pg (ref 26.0–34.0)
MCHC: 32.1 g/dL (ref 30.0–36.0)
MCV: 99.3 fL (ref 80.0–100.0)
Monocytes Absolute: 0.5 10*3/uL (ref 0.1–1.0)
Monocytes Relative: 12 %
Neutro Abs: 1.7 10*3/uL (ref 1.7–7.7)
Neutrophils Relative %: 44 %
Platelet Count: 136 10*3/uL — ABNORMAL LOW (ref 150–400)
RBC: 3.07 MIL/uL — ABNORMAL LOW (ref 3.87–5.11)
RDW: 17.8 % — ABNORMAL HIGH (ref 11.5–15.5)
WBC Count: 4 10*3/uL (ref 4.0–10.5)
nRBC: 0 % (ref 0.0–0.2)

## 2019-10-09 LAB — CMP (CANCER CENTER ONLY)
ALT: 63 U/L — ABNORMAL HIGH (ref 0–44)
AST: 54 U/L — ABNORMAL HIGH (ref 15–41)
Albumin: 3.4 g/dL — ABNORMAL LOW (ref 3.5–5.0)
Alkaline Phosphatase: 138 U/L — ABNORMAL HIGH (ref 38–126)
Anion gap: 8 (ref 5–15)
BUN: 19 mg/dL (ref 6–20)
CO2: 20 mmol/L — ABNORMAL LOW (ref 22–32)
Calcium: 9.8 mg/dL (ref 8.9–10.3)
Chloride: 110 mmol/L (ref 98–111)
Creatinine: 1.06 mg/dL — ABNORMAL HIGH (ref 0.44–1.00)
GFR, Est AFR Am: >60 mL/min (ref >60)
GFR, Estimated: 57 mL/min — ABNORMAL LOW (ref >60)
Glucose, Bld: 79 mg/dL (ref 70–99)
Potassium: 4 mmol/L (ref 3.5–5.1)
Sodium: 138 mmol/L (ref 135–145)
Total Bilirubin: 0.3 mg/dL (ref 0.3–1.2)
Total Protein: 7.4 g/dL (ref 6.5–8.1)

## 2019-10-09 LAB — IRON AND TIBC
Iron: 71 ug/dL (ref 41–142)
Saturation Ratios: 23 % (ref 21–57)
TIBC: 309 ug/dL (ref 236–444)
UIBC: 238 ug/dL (ref 120–384)

## 2019-10-09 LAB — CEA (IN HOUSE-CHCC): CEA (CHCC-In House): 76.3 ng/mL — ABNORMAL HIGH (ref 0.00–5.00)

## 2019-10-09 LAB — FERRITIN: Ferritin: 275 ng/mL (ref 11–307)

## 2019-10-09 LAB — TOTAL PROTEIN, URINE DIPSTICK: Protein, ur: 30 mg/dL — AB

## 2019-10-09 MED ORDER — SODIUM CHLORIDE 0.9 % IV SOLN
2470.0000 mg/m2 | INTRAVENOUS | Status: DC
  Administered 2019-10-09: 5000 mg via INTRAVENOUS
  Filled 2019-10-09: qty 100

## 2019-10-09 MED ORDER — SODIUM CHLORIDE 0.9 % IV SOLN
INTRAVENOUS | Status: DC
  Filled 2019-10-09: qty 250

## 2019-10-09 MED ORDER — SODIUM CHLORIDE 0.9 % IV SOLN
400.0000 mg/m2 | Freq: Once | INTRAVENOUS | Status: AC
  Administered 2019-10-09: 808 mg via INTRAVENOUS
  Filled 2019-10-09: qty 40.4

## 2019-10-09 MED ORDER — SODIUM CHLORIDE 0.9 % IV SOLN
5.0000 mg/kg | Freq: Once | INTRAVENOUS | Status: AC
  Administered 2019-10-09: 400 mg via INTRAVENOUS
  Filled 2019-10-09: qty 16

## 2019-10-09 MED ORDER — SODIUM CHLORIDE 0.9 % IV SOLN
10.0000 mg | Freq: Once | INTRAVENOUS | Status: AC
  Administered 2019-10-09: 10 mg via INTRAVENOUS
  Filled 2019-10-09: qty 10

## 2019-10-09 MED ORDER — PALONOSETRON HCL INJECTION 0.25 MG/5ML
0.2500 mg | Freq: Once | INTRAVENOUS | Status: AC
  Administered 2019-10-09: 0.25 mg via INTRAVENOUS

## 2019-10-09 MED ORDER — DEXTROSE 5 % IV SOLN
Freq: Once | INTRAVENOUS | Status: AC
  Filled 2019-10-09: qty 250

## 2019-10-09 MED ORDER — PALONOSETRON HCL INJECTION 0.25 MG/5ML
INTRAVENOUS | Status: AC
  Filled 2019-10-09: qty 5

## 2019-10-09 NOTE — Patient Instructions (Signed)
[facility] Discharge Instructions for Patients Receiving Chemotherapy  Today you received the following chemotherapy agents: Bevacizumab, Leucovorin, and Fluorouracil  To help prevent nausea and vomiting after your treatment, we encourage you to take your nausea medication as prescribed.    If you develop nausea and vomiting that is not controlled by your nausea medication, call the clinic.   BELOW ARE SYMPTOMS THAT SHOULD BE REPORTED IMMEDIATELY:  *FEVER GREATER THAN 100.5 F  *CHILLS WITH OR WITHOUT FEVER  NAUSEA AND VOMITING THAT IS NOT CONTROLLED WITH YOUR NAUSEA MEDICATION  *UNUSUAL SHORTNESS OF BREATH  *UNUSUAL BRUISING OR BLEEDING  TENDERNESS IN MOUTH AND THROAT WITH OR WITHOUT PRESENCE OF ULCERS  *URINARY PROBLEMS  *BOWEL PROBLEMS  UNUSUAL RASH Items with * indicate a potential emergency and should be followed up as soon as possible.  Feel free to call the clinic should you have any questions or concerns. The clinic phone number is [phone].  Please show the [facility] at check-in to the Emergency Department and triage nurse.

## 2019-10-10 ENCOUNTER — Telehealth: Payer: Self-pay | Admitting: Nurse Practitioner

## 2019-10-10 DIAGNOSIS — C189 Malignant neoplasm of colon, unspecified: Secondary | ICD-10-CM | POA: Diagnosis not present

## 2019-10-10 NOTE — Telephone Encounter (Signed)
Scheduled per 7/6 los. Pt is aware of appt times and dates.

## 2019-10-11 ENCOUNTER — Inpatient Hospital Stay: Payer: BC Managed Care – PPO

## 2019-10-11 ENCOUNTER — Other Ambulatory Visit: Payer: Self-pay

## 2019-10-11 VITALS — BP 137/48 | HR 47 | Temp 98.2°F

## 2019-10-11 DIAGNOSIS — C773 Secondary and unspecified malignant neoplasm of axilla and upper limb lymph nodes: Secondary | ICD-10-CM | POA: Diagnosis not present

## 2019-10-11 DIAGNOSIS — D509 Iron deficiency anemia, unspecified: Secondary | ICD-10-CM | POA: Diagnosis not present

## 2019-10-11 DIAGNOSIS — C189 Malignant neoplasm of colon, unspecified: Secondary | ICD-10-CM

## 2019-10-11 DIAGNOSIS — D630 Anemia in neoplastic disease: Secondary | ICD-10-CM | POA: Diagnosis not present

## 2019-10-11 DIAGNOSIS — Z7189 Other specified counseling: Secondary | ICD-10-CM

## 2019-10-11 DIAGNOSIS — Z5111 Encounter for antineoplastic chemotherapy: Secondary | ICD-10-CM | POA: Diagnosis not present

## 2019-10-11 DIAGNOSIS — C787 Secondary malignant neoplasm of liver and intrahepatic bile duct: Secondary | ICD-10-CM | POA: Diagnosis not present

## 2019-10-11 DIAGNOSIS — Z452 Encounter for adjustment and management of vascular access device: Secondary | ICD-10-CM | POA: Diagnosis not present

## 2019-10-11 DIAGNOSIS — C184 Malignant neoplasm of transverse colon: Secondary | ICD-10-CM | POA: Diagnosis not present

## 2019-10-11 DIAGNOSIS — Z5112 Encounter for antineoplastic immunotherapy: Secondary | ICD-10-CM | POA: Diagnosis not present

## 2019-10-11 MED ORDER — HEPARIN SOD (PORK) LOCK FLUSH 100 UNIT/ML IV SOLN
500.0000 [IU] | Freq: Once | INTRAVENOUS | Status: AC | PRN
  Administered 2019-10-11: 500 [IU]
  Filled 2019-10-11: qty 5

## 2019-10-11 MED ORDER — SODIUM CHLORIDE 0.9% FLUSH
10.0000 mL | INTRAVENOUS | Status: DC | PRN
  Administered 2019-10-11: 10 mL
  Filled 2019-10-11: qty 10

## 2019-10-15 DIAGNOSIS — Z933 Colostomy status: Secondary | ICD-10-CM | POA: Diagnosis not present

## 2019-10-15 DIAGNOSIS — K56609 Unspecified intestinal obstruction, unspecified as to partial versus complete obstruction: Secondary | ICD-10-CM | POA: Diagnosis not present

## 2019-10-18 ENCOUNTER — Other Ambulatory Visit: Payer: Self-pay | Admitting: Nurse Practitioner

## 2019-10-24 ENCOUNTER — Other Ambulatory Visit: Payer: Self-pay | Admitting: Hematology

## 2019-10-24 ENCOUNTER — Other Ambulatory Visit: Payer: Self-pay | Admitting: Internal Medicine

## 2019-10-24 NOTE — Progress Notes (Signed)
Pharmacist Chemotherapy Monitoring - Follow Up Assessment    I verify that I have reviewed each item in the below checklist:  . Regimen for the patient is scheduled for the appropriate day and plan matches scheduled date. Marland Kitchen Appropriate non-routine labs are ordered dependent on drug ordered. . If applicable, additional medications reviewed and ordered per protocol based on lifetime cumulative doses and/or treatment regimen.   Plan for follow-up and/or issues identified: Yes . I-vent associated with next due treatment: Yes . MD and/or nursing notified: No   Kennith Center, Pharm.D., CPP 10/24/2019@2 :42 PM

## 2019-10-25 DIAGNOSIS — K645 Perianal venous thrombosis: Secondary | ICD-10-CM | POA: Diagnosis not present

## 2019-10-30 ENCOUNTER — Inpatient Hospital Stay: Payer: BC Managed Care – PPO

## 2019-10-30 ENCOUNTER — Inpatient Hospital Stay (HOSPITAL_BASED_OUTPATIENT_CLINIC_OR_DEPARTMENT_OTHER): Payer: BC Managed Care – PPO | Admitting: Nurse Practitioner

## 2019-10-30 ENCOUNTER — Other Ambulatory Visit: Payer: BC Managed Care – PPO

## 2019-10-30 ENCOUNTER — Encounter: Payer: Self-pay | Admitting: Nurse Practitioner

## 2019-10-30 ENCOUNTER — Other Ambulatory Visit: Payer: Self-pay | Admitting: Hematology

## 2019-10-30 ENCOUNTER — Other Ambulatory Visit: Payer: Self-pay

## 2019-10-30 VITALS — BP 163/75

## 2019-10-30 VITALS — BP 161/58 | HR 47 | Temp 98.9°F | Resp 18 | Ht 67.0 in | Wt 196.3 lb

## 2019-10-30 DIAGNOSIS — Z7189 Other specified counseling: Secondary | ICD-10-CM

## 2019-10-30 DIAGNOSIS — C189 Malignant neoplasm of colon, unspecified: Secondary | ICD-10-CM

## 2019-10-30 DIAGNOSIS — G620 Drug-induced polyneuropathy: Secondary | ICD-10-CM

## 2019-10-30 DIAGNOSIS — T451X5A Adverse effect of antineoplastic and immunosuppressive drugs, initial encounter: Secondary | ICD-10-CM | POA: Diagnosis not present

## 2019-10-30 DIAGNOSIS — Z452 Encounter for adjustment and management of vascular access device: Secondary | ICD-10-CM | POA: Diagnosis not present

## 2019-10-30 DIAGNOSIS — C773 Secondary and unspecified malignant neoplasm of axilla and upper limb lymph nodes: Secondary | ICD-10-CM | POA: Diagnosis not present

## 2019-10-30 DIAGNOSIS — C787 Secondary malignant neoplasm of liver and intrahepatic bile duct: Secondary | ICD-10-CM

## 2019-10-30 DIAGNOSIS — D509 Iron deficiency anemia, unspecified: Secondary | ICD-10-CM | POA: Diagnosis not present

## 2019-10-30 DIAGNOSIS — C184 Malignant neoplasm of transverse colon: Secondary | ICD-10-CM | POA: Diagnosis not present

## 2019-10-30 DIAGNOSIS — D630 Anemia in neoplastic disease: Secondary | ICD-10-CM | POA: Diagnosis not present

## 2019-10-30 DIAGNOSIS — Z5111 Encounter for antineoplastic chemotherapy: Secondary | ICD-10-CM | POA: Diagnosis not present

## 2019-10-30 DIAGNOSIS — Z5112 Encounter for antineoplastic immunotherapy: Secondary | ICD-10-CM | POA: Diagnosis not present

## 2019-10-30 LAB — CMP (CANCER CENTER ONLY)
ALT: 32 U/L (ref 0–44)
AST: 31 U/L (ref 15–41)
Albumin: 3.7 g/dL (ref 3.5–5.0)
Alkaline Phosphatase: 159 U/L — ABNORMAL HIGH (ref 38–126)
Anion gap: 5 (ref 5–15)
BUN: 26 mg/dL — ABNORMAL HIGH (ref 6–20)
CO2: 19 mmol/L — ABNORMAL LOW (ref 22–32)
Calcium: 10.2 mg/dL (ref 8.9–10.3)
Chloride: 115 mmol/L — ABNORMAL HIGH (ref 98–111)
Creatinine: 1.07 mg/dL — ABNORMAL HIGH (ref 0.44–1.00)
GFR, Est AFR Am: >60 mL/min (ref >60)
GFR, Estimated: 57 mL/min — ABNORMAL LOW (ref >60)
Glucose, Bld: 76 mg/dL (ref 70–99)
Potassium: 5 mmol/L (ref 3.5–5.1)
Sodium: 139 mmol/L (ref 135–145)
Total Bilirubin: 0.3 mg/dL (ref 0.3–1.2)
Total Protein: 7.7 g/dL (ref 6.5–8.1)

## 2019-10-30 LAB — CBC WITH DIFFERENTIAL (CANCER CENTER ONLY)
Abs Immature Granulocytes: 0.01 10*3/uL (ref 0.00–0.07)
Basophils Absolute: 0 10*3/uL (ref 0.0–0.1)
Basophils Relative: 1 %
Eosinophils Absolute: 0.2 10*3/uL (ref 0.0–0.5)
Eosinophils Relative: 4 %
HCT: 33.9 % — ABNORMAL LOW (ref 36.0–46.0)
Hemoglobin: 10.6 g/dL — ABNORMAL LOW (ref 12.0–15.0)
Immature Granulocytes: 0 %
Lymphocytes Relative: 37 %
Lymphs Abs: 1.7 10*3/uL (ref 0.7–4.0)
MCH: 31.6 pg (ref 26.0–34.0)
MCHC: 31.3 g/dL (ref 30.0–36.0)
MCV: 101.2 fL — ABNORMAL HIGH (ref 80.0–100.0)
Monocytes Absolute: 0.6 10*3/uL (ref 0.1–1.0)
Monocytes Relative: 13 %
Neutro Abs: 2.1 10*3/uL (ref 1.7–7.7)
Neutrophils Relative %: 45 %
Platelet Count: 175 10*3/uL (ref 150–400)
RBC: 3.35 MIL/uL — ABNORMAL LOW (ref 3.87–5.11)
RDW: 15.7 % — ABNORMAL HIGH (ref 11.5–15.5)
WBC Count: 4.6 10*3/uL (ref 4.0–10.5)
nRBC: 0 % (ref 0.0–0.2)

## 2019-10-30 LAB — TOTAL PROTEIN, URINE DIPSTICK: Protein, ur: 30 mg/dL — AB

## 2019-10-30 MED ORDER — SODIUM CHLORIDE 0.9 % IV SOLN
5.0000 mg/kg | Freq: Once | INTRAVENOUS | Status: AC
  Administered 2019-10-30: 400 mg via INTRAVENOUS
  Filled 2019-10-30: qty 16

## 2019-10-30 MED ORDER — PALONOSETRON HCL INJECTION 0.25 MG/5ML
INTRAVENOUS | Status: AC
  Filled 2019-10-30: qty 5

## 2019-10-30 MED ORDER — SODIUM CHLORIDE 0.9 % IV SOLN
400.0000 mg/m2 | Freq: Once | INTRAVENOUS | Status: AC
  Administered 2019-10-30: 808 mg via INTRAVENOUS
  Filled 2019-10-30: qty 40.4

## 2019-10-30 MED ORDER — PALONOSETRON HCL INJECTION 0.25 MG/5ML
0.2500 mg | Freq: Once | INTRAVENOUS | Status: AC
  Administered 2019-10-30: 0.25 mg via INTRAVENOUS

## 2019-10-30 MED ORDER — DEXTROSE 5 % IV SOLN
Freq: Once | INTRAVENOUS | Status: DC
  Filled 2019-10-30: qty 250

## 2019-10-30 MED ORDER — SODIUM CHLORIDE 0.9 % IV SOLN
2470.0000 mg/m2 | INTRAVENOUS | Status: DC
  Administered 2019-10-30: 5000 mg via INTRAVENOUS
  Filled 2019-10-30: qty 100

## 2019-10-30 MED ORDER — SODIUM CHLORIDE 0.9 % IV SOLN
10.0000 mg | Freq: Once | INTRAVENOUS | Status: AC
  Administered 2019-10-30: 10 mg via INTRAVENOUS
  Filled 2019-10-30: qty 10

## 2019-10-30 MED ORDER — SODIUM CHLORIDE 0.9 % IV SOLN
INTRAVENOUS | Status: DC
  Filled 2019-10-30: qty 250

## 2019-10-30 NOTE — Progress Notes (Signed)
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 Bowel-gas pattern consistent with small bowel obstruction. No free air. 2. No acute chest findings.   01/23/2019 Imaging   CT AP IMPRESSION: Obstructing mid transverse colonic mass with mild regional adenopathy and hepatic metastatic disease. The mass likely extends through the serosa; no ascites or peritoneal nodularity.   01/24/2019 Surgery   Surgeon: Clovis Riley MD Assistant: [name] Procedure performed: Transverse colectomy with end colostomy, liver biopsy Procedure classification: URGENT/EMERGENT Preop diagnosis: Obstructing, metastatic transverse colon mass Post-op diagnosis/intraop findings: Same   01/24/2019 Pathology Results   FINAL MICROSCOPIC DIAGNOSIS:   A. COLON, TRANSVERSE, RESECTION:  Colonic adenocarcinoma, 5 cm.  Carcinoma extends into pericolonic connective tissue and focally to  serosal surface.  Margins not involved.  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   B. LIVER NODULE, LEFT, BIOPSY:  Metastatic adenocarcinoma.    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 Hypermetabolic metastatic disease in the liver and mediastinal/hilar/axillary lymph nodes. 2. Focal hypermetabolism in the rectum. Continued attention on follow-up exams is warranted. 3. Focal hypermetabolism medial to the right adrenal gland may be within a metastatic lymph node, better visualized on 01/23/2019. 4. Aortic atherosclerosis (ICD10-170.0). Coronary artery calcification.   03/12/2019 -  Chemotherapy   She started 5FU q2weeks on 03/12/19 for 2 cycles. She started full dose FOLFOX with Avastin on 04/09/19.   05/31/2019 Imaging   Restaging CT CAP IMPRESSION: 1. Similar to mild interval decrease in size of multiple hepatic lesions, partially calcified. 2. 2 mm right upper lobe pulmonary nodule. Recommend attention on follow-up. 3. Emphysema and aortic atherosclerosis.   08/23/2019 Imaging   CT CAP w contrast  IMPRESSION: 1. The dominant peripheral right liver metastasis has mildly increased. Other smaller liver metastases are stable. 2. Otherwise no new or progressive metastatic disease in the chest, abdomen or pelvis. 3. Aortic Atherosclerosis (ICD10-I70.0) and Emphysema (ICD10-J43.9).     CURRENT THERAPY:  She started 5FU q2weeks on 03/12/19 for 2 cycles. She started full dose FOLFOX with Avastin on 04/09/19.Oxaliplatin reduced to 70 mg/m2 on cycle 13 due to neuropathy and renal dysfunction; further dose reduced to 40 mg/m2 on cycle 14 due to neuropathy.  Oxaliplatin held from cycle 16 due to progressive neuropathy  INTERVAL HISTORY: Casey Black returns for follow-up and treatment as scheduled.  She completed cycle 16 of treatment including 5-FU, leucovorin and Avastin on 10/09/2019 (first cycle without oxali).  She tolerates treatment well except  neuropathy that is  progressing up her palms and legs.  She wonders if this is related to doing a lot of walking at the beach recently.  Hands and feet feel numb, tingly and cold "on the inside."  She is occasionally off balance without fall, has difficulty with buttons, and looser grip.  She takes gabapentin 1 tab at lunch and 1 at bedtime which helps some.  Overall this is "not bad, just feels funny." Saw Dr. Amado Coe who notes her hemorrhoids have improved.  She has a question about having oral surgery.  When her teeth were initially extracted for dentures, a tooth broke off and left a long root, since she has lost weight her gums have shrunk she needs new dentures and needs the broken tooth removed.  Otherwise she is doing well, denies mucositis, fever, chills, cough, chest pain, dyspnea, n/v/c/d, new or worsening abdominal pain, or bleeding.   MEDICAL HISTORY:  Past Medical History:  Diagnosis Date  . Anemia    low iron  . Cancer [facility])    colon cancer  . Colon cancer ([place]) 01/2019  . Hypertension 01/23/2019  . Personal history of chemotherapy 01/2019   colon CA  . SBO (small bowel obstruction) ([place]) 01/23/2019    SURGICAL HISTORY: Past Surgical History:  Procedure Laterality Date  . CESAREAN SECTION     x2  . COLOSTOMY N/A 01/24/2019   Procedure: End Loop Colostomy;  Surgeon: Clovis Riley, MD;  Location: [place];  Service: General;  Laterality: N/A;  . PARTIAL COLECTOMY N/A 01/24/2019   Procedure: PARTIAL COLECTOMY;  Surgeon: Clovis Riley, MD;  Location: [place];  Service: General;  Laterality: N/A;  . PORTACATH PLACEMENT Right 02/28/2019   Procedure: INSERTION PORT-A-CATH WITH ULTRASOUND GUIDANCE;  Surgeon: Clovis Riley, MD;  Location: [place];  Service: General;  Laterality: Right;    I have reviewed the social history and family history with the patient and they are unchanged from previous note.  ALLERGIES:  has No Known Allergies.  MEDICATIONS:  Current Outpatient  Medications  Medication Sig Dispense Refill  . acetaminophen (TYLENOL) 325 MG tablet Take 2 tablets (650 mg total) by mouth every 6 (six) hours as needed.    Marland Kitchen amLODipine (NORVASC) 10 MG tablet TAKE 1 TABLET BY MOUTH EVERY DAY 30 tablet 1  . ferrous sulfate 325 (65 FE) MG tablet TAKE 1 TABLET (325 MG TOTAL) BY MOUTH 2 (TWO) TIMES DAILY WITH A MEAL. 180 tablet 1  . gabapentin (NEURONTIN) 100 MG capsule Take 1 capsule (100 mg total) by mouth at bedtime. 30 capsule 2  . hydrALAZINE (APRESOLINE) 25 MG tablet TAKE 1 TABLET (25 MG TOTAL) BY MOUTH EVERY 8 (EIGHT) HOURS. 90 tablet 1  . hydrochlorothiazide (HYDRODIURIL) 12.5 MG tablet TAKE 1 TABLET BY MOUTH EVERY DAY 30 tablet 1  . hydrocortisone (ANUSOL-HC) 2.5 % rectal cream Place 1 application rectally 2 (two) times daily. 30 g 0  . Ibuprofen (ADVIL) 200 MG CAPS Take 400 mg by mouth daily as needed (pain).    . isosorbide mononitrate (IMDUR) 30 MG 24 hr tablet TAKE 1 TABLET BY MOUTH EVERY DAY 90 tablet 0  . lidocaine (LMX) 4 % cream Apply 1 application topically 3 (three) times daily as needed. 30 g 0  . lidocaine-prilocaine (EMLA) cream Apply to affected area once (Patient taking differently: Apply 1 application topically once. Apply to affected area once) 30 g 3  . lisinopril (ZESTRIL) 40 MG tablet TAKE 1 TABLET BY MOUTH EVERY DAY 90 tablet 1  .  loperamide (IMODIUM) 2 MG capsule TAKE 1 CAPSULE (2 MG TOTAL) BY MOUTH AS NEEDED FOR DIARRHEA OR LOOSE STOOLS. 90 capsule 0  . metoprolol tartrate (LOPRESSOR) 50 MG tablet TAKE 1 TABLET BY MOUTH TWICE A DAY 180 tablet 1  . metroNIDAZOLE (FLAGYL) 250 MG tablet Take 1 tablet (250 mg total) by mouth 3 (three) times daily. 1 tablet 0  . ondansetron (ZOFRAN) 4 MG tablet Take 1 tablet (4 mg total) by mouth every 6 (six) hours as needed for nausea. 30 tablet 0  . ondansetron (ZOFRAN) 8 MG tablet Take 1 tablet (8 mg total) by mouth 2 (two) times daily as needed for refractory nausea / vomiting. Start on day 3 after  chemotherapy. 30 tablet 1  . oxyCODONE-acetaminophen (PERCOCET/ROXICET) 5-325 MG tablet Take 1-2 tablets by mouth every 6 (six) hours as needed for severe pain. 20 tablet 0  . polycarbophil (FIBERCON) 625 MG tablet Take 1 tablet (625 mg total) by mouth daily. 30 tablet 0  . prochlorperazine (COMPAZINE) 10 MG tablet Take 1 tablet (10 mg total) by mouth every 6 (six) hours as needed (Nausea or vomiting). 30 tablet 1   No current facility-administered medications for this visit.    PHYSICAL EXAMINATION: ECOG PERFORMANCE STATUS: 1 - Symptomatic but completely ambulatory  Vitals:   10/30/19 1109  BP: (!) 161/58  Pulse: 47  Resp: 18  Temp: 98.9 F (37.2 C)  SpO2: 100%   Filed Weights   10/30/19 1109  Weight: 196 lb 4.8 oz (89 kg)    GENERAL:alert, no distress and comfortable SKIN: No rash to exposed skin.  Palms with hyperpigmentation EYES: sclera clear OROPHARYNX: No thrush, erythema, or ulcers.  Broken tooth noted to right maxilla LUNGS:  normal breathing effort HEART:  no lower extremity edema Musculoskeletal:no cyanosis of digits and no clubbing  NEURO: alert & oriented x 3 with fluent speech, normal gait  PAC without erythema   LABORATORY DATA:  I have reviewed the data as listed CBC Latest Ref Rng & Units 10/30/2019 10/09/2019 09/24/2019  WBC 4.0 - 10.5 K/uL 4.6 4.0 3.6(L)  Hemoglobin 12.0 - 15.0 g/dL 10.6(L) 9.8(L) 9.4(L)  Hematocrit 36 - 46 % 33.9(L) 30.5(L) 29.5(L)  Platelets 150 - 400 K/uL 175 136(L) 132(L)     CMP Latest Ref Rng & Units 10/30/2019 10/09/2019 09/24/2019  Glucose 70 - 99 mg/dL 76 79 73  BUN 6 - 20 mg/dL 26(H) 19 25(H)  Creatinine 0.44 - 1.00 mg/dL 1.07(H) 1.06(H) 1.29(H)  Sodium 135 - 145 mmol/L 139 138 141  Potassium 3.5 - 5.1 mmol/L 5.0 4.0 4.7  Chloride 98 - 111 mmol/L 115(H) 110 114(H)  CO2 22 - 32 mmol/L 19(L) 20(L) 19(L)  Calcium 8.9 - 10.3 mg/dL 10.2 9.8 9.4  Total Protein 6.5 - 8.1 g/dL 7.7 7.4 7.1  Total Bilirubin 0.3 - 1.2 mg/dL 0.3 0.3  0.2(L)  Alkaline Phos 38 - 126 U/L 159(H) 138(H) 80  AST 15 - 41 U/L 31 54(H) 20  ALT 0 - 44 U/L 32 63(H) 23      RADIOGRAPHIC STUDIES: I have personally reviewed the radiological images as listed and agreed with the findings in the report. No results found.   ASSESSMENT & PLAN: Casey Black a [id number].o.femalewith   1. Adenocarcinoma of transverse colon, moderately differentiated, pT4aN1aM1a stage IV with liverand nodalmetastasis, MMR normal  -Diagnosed in 01/2019 afteremergent colectomy and liver biopsy. 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  -PET from 02/26/19 showsknown liver metastasis and metastatic  lymphadenopathy in chest and right axilla.  -[name] shows MSI stable disease, Kras+ No targetable mutation, she is not a candidate for EGFR or immunotherapy. -Began palliative first line chemoon 03/12/2019, received 5FU/leuc with first 2 cycles forlarge open abdominal wound after surgery. She started full dose FOLFOX and avastin with cycle 2, s/p15cycles of treatment -Interim scans have been stable overall  -She developed CIPN after cycle 12, oxali dose reduced to 70 mg/m2 with cycle 13. Symptoms progressed and further reduced to 40 mg/m2 from cycle 14 and 15 -She has progressive neuropathy but otherwise tolerating treatment well. No new symptoms or side effects. Oxaliplatin was held from cycle 16  2. 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 She has moderate sensory deficit in her feet but still able to function and ambulate well. No pain or sensitivity  -Oxaliplatin further dose reduced to 40 mg/m2 cycle 14.  -persistent for cycle 15, dose continued 40 mg/m2 and initiated gabapentin  -progressive now, Oxaliplatin is  being held  from cycle 16 -Titrating gabapentin (up to 100 mg AM and 300 mg qHS starting 10/30/19)  3.Nutrition/Anorexia  -she reportedly weighed 253 lbs a few years ago, and weighed 203 lbs at symptom onset~11/2018 -190 lbsin 02/2019.  -Continue to f/u withdietician -she has been able to gain weight on chemo; continue monitoring  4. Iron deficiency anemia, and anemia secondary to cancer -She had low ferritinand low TIBC -IDA responding to ferrous sulfate BID.  She can reduce to oral iron once daily for now -iron studies remain normal, continue monitoring  5. HTN -Initiallyon hydralazine, isosorbide, lisinopril, metoprolol. -Avastin and anxiety contributing to BP elevation in clinic. Received clonidine PRN on chemo days.  -Recently added amlodipine and HCTZ with improvement. Tolerating well without sx of hypotension  6. Social support -She worked in [employer] at 2 different jobs, but has been out of work lately due to her symptoms  -She is not getting disability, no pay, but her employer kept her on their insurance plan -has been referred to SW to discuss financial option/resources  7. Goals of care discussion -Goal is palliative, to control disease and prolong her life. -wehavediscussedher cancer is not curable at this stage, and that we will continue treatment as long as she can tolerate and her disease is controlled.she understands her treatment plan will evolve depending on her response and side effects of treatment.  -Full code    Disposition: Casey Black appears stable.  She completed another cycle of FOLFOX and Avastin, oxaliplatin was omitted with last cycle due to progressive neuropathy.  Clinically her symptoms are worsening.  I recommend to increase gabapentin to 100 mg a.m. and 300 mg nightly, and will continue to titrate as needed.  If she fails to respond may refer her to Dr. Mickeal Skinner and/or PT.  Otherwise she tolerates treatment well without  other side effects.  She is able to recover and function well.  CBC, CMP, urine protein are adequate to proceed with treatment.  Iron levels are adequate, she can reduce oral iron to once daily.  CEA is rising, but clinically she continues to feel well and tolerate treatment.  She will proceed with another cycle of 5-FU, leucovorin and Avastin today.  Plan to restage next month. She will return for follow-up and next cycle in 2 weeks.  We can give her an extra week off treatment and hold avastin when she needs to have oral surgery done. Will communicate with her dentist and oral surgeon.   All questions were answered. The patient knows to call the clinic with any problems, questions or concerns. No barriers to learning were detected.     [name], NP 10/30/19

## 2019-10-30 NOTE — Progress Notes (Signed)
10/30/19  Keep Bevacizumab at 400 mg today despite weight gain.  Will monitor with next cycle.  Dose is at 10% difference.  Henreitta Leber, PharmD

## 2019-10-30 NOTE — Patient Instructions (Signed)
[facility] Discharge Instructions for Patients Receiving Chemotherapy  Today you received the following chemotherapy agents Bevacizumab (ZIRABEV), Leucovorin & Flourouracil (ADRUCIL).  To help prevent nausea and vomiting after your treatment, we encourage you to take your nausea medication as prescribed.   If you develop nausea and vomiting that is not controlled by your nausea medication, call the clinic.   BELOW ARE SYMPTOMS THAT SHOULD BE REPORTED IMMEDIATELY:  *FEVER GREATER THAN 100.5 F  *CHILLS WITH OR WITHOUT FEVER  NAUSEA AND VOMITING THAT IS NOT CONTROLLED WITH YOUR NAUSEA MEDICATION  *UNUSUAL SHORTNESS OF BREATH  *UNUSUAL BRUISING OR BLEEDING  TENDERNESS IN MOUTH AND THROAT WITH OR WITHOUT PRESENCE OF ULCERS  *URINARY PROBLEMS  *BOWEL PROBLEMS  UNUSUAL RASH Items with * indicate a potential emergency and should be followed up as soon as possible.  Feel free to call the clinic should you have any questions or concerns. The clinic phone number is [phone].  Please show the [place] at check-in to the Emergency Department and triage nurse.

## 2019-10-31 ENCOUNTER — Telehealth: Payer: Self-pay | Admitting: Nurse Practitioner

## 2019-10-31 NOTE — Telephone Encounter (Signed)
Scheduled per 7/27 los. Noted to give pt appt calendar on next visit.

## 2019-11-01 ENCOUNTER — Inpatient Hospital Stay: Payer: BC Managed Care – PPO

## 2019-11-01 ENCOUNTER — Other Ambulatory Visit: Payer: Self-pay

## 2019-11-01 VITALS — BP 146/51 | HR 40 | Temp 98.5°F | Resp 16

## 2019-11-01 DIAGNOSIS — Z7189 Other specified counseling: Secondary | ICD-10-CM

## 2019-11-01 DIAGNOSIS — Z452 Encounter for adjustment and management of vascular access device: Secondary | ICD-10-CM | POA: Diagnosis not present

## 2019-11-01 DIAGNOSIS — D509 Iron deficiency anemia, unspecified: Secondary | ICD-10-CM | POA: Diagnosis not present

## 2019-11-01 DIAGNOSIS — C773 Secondary and unspecified malignant neoplasm of axilla and upper limb lymph nodes: Secondary | ICD-10-CM | POA: Diagnosis not present

## 2019-11-01 DIAGNOSIS — C787 Secondary malignant neoplasm of liver and intrahepatic bile duct: Secondary | ICD-10-CM | POA: Diagnosis not present

## 2019-11-01 DIAGNOSIS — C189 Malignant neoplasm of colon, unspecified: Secondary | ICD-10-CM

## 2019-11-01 DIAGNOSIS — Z5111 Encounter for antineoplastic chemotherapy: Secondary | ICD-10-CM | POA: Diagnosis not present

## 2019-11-01 DIAGNOSIS — Z5112 Encounter for antineoplastic immunotherapy: Secondary | ICD-10-CM | POA: Diagnosis not present

## 2019-11-01 DIAGNOSIS — C184 Malignant neoplasm of transverse colon: Secondary | ICD-10-CM | POA: Diagnosis not present

## 2019-11-01 DIAGNOSIS — D630 Anemia in neoplastic disease: Secondary | ICD-10-CM | POA: Diagnosis not present

## 2019-11-01 MED ORDER — HEPARIN SOD (PORK) LOCK FLUSH 100 UNIT/ML IV SOLN
500.0000 [IU] | Freq: Once | INTRAVENOUS | Status: AC | PRN
  Administered 2019-11-01: 500 [IU]
  Filled 2019-11-01: qty 5

## 2019-11-01 MED ORDER — SODIUM CHLORIDE 0.9% FLUSH
10.0000 mL | INTRAVENOUS | Status: DC | PRN
  Administered 2019-11-01: 10 mL
  Filled 2019-11-01: qty 10

## 2019-11-01 NOTE — Patient Instructions (Signed)
Implanted Port Insertion, Care After °This sheet gives you information about how to care for yourself after your procedure. Your health care provider may also give you more specific instructions. If you have problems or questions, contact your health care provider. °What can I expect after the procedure? °After the procedure, it is common to have: °· Discomfort at the port insertion site. °· Bruising on the skin over the port. This should improve over 3-4 days. °Follow these instructions at home: °Port care °· After your port is placed, you will get a manufacturer's information card. The card has information about your port. Keep this card with you at all times. °· Take care of the port as told by your health care provider. Ask your health care provider if you or a family member can get training for taking care of the port at home. A home health care nurse may also take care of the port. °· Make sure to remember what type of port you have. °Incision care ° °  ° °· Follow instructions from your health care provider about how to take care of your port insertion site. Make sure you: °? Wash your hands with soap and water before and after you change your bandage (dressing). If soap and water are not available, use hand sanitizer. °? Change your dressing as told by your health care provider. °? Leave stitches (sutures), skin glue, or adhesive strips in place. These skin closures may need to stay in place for 2 weeks or longer. If adhesive strip edges start to loosen and curl up, you may trim the loose edges. Do not remove adhesive strips completely unless your health care provider tells you to do that. °· Check your port insertion site every day for signs of infection. Check for: °? Redness, swelling, or pain. °? Fluid or blood. °? Warmth. °? Pus or a bad smell. °Activity °· Return to your normal activities as told by your health care provider. Ask your health care provider what activities are safe for you. °· Do not  lift anything that is heavier than 10 lb (4.5 kg), or the limit that you are told, until your health care provider says that it is safe. °General instructions °· Take over-the-counter and prescription medicines only as told by your health care provider. °· Do not take baths, swim, or use a hot tub until your health care provider approves. Ask your health care provider if you may take showers. You may only be allowed to take sponge baths. °· Do not drive for 24 hours if you were given a sedative during your procedure. °· Wear a medical alert bracelet in case of an emergency. This will tell any health care providers that you have a port. °· Keep all follow-up visits as told by your health care provider. This is important. °Contact a health care provider if: °· You cannot flush your port with saline as directed, or you cannot draw blood from the port. °· You have a fever or chills. °· You have redness, swelling, or pain around your port insertion site. °· You have fluid or blood coming from your port insertion site. °· Your port insertion site feels warm to the touch. °· You have pus or a bad smell coming from the port insertion site. °Get help right away if: °· You have chest pain or shortness of breath. °· You have bleeding from your port that you cannot control. °Summary °· Take care of the port as told by your health   care provider. Keep the manufacturer's information card with you at all times. °· Change your dressing as told by your health care provider. °· Contact a health care provider if you have a fever or chills or if you have redness, swelling, or pain around your port insertion site. °· Keep all follow-up visits as told by your health care provider. °This information is not intended to replace advice given to you by your health care provider. Make sure you discuss any questions you have with your health care provider. °Document Revised: 10/18/2017 Document Reviewed: 10/18/2017 °Elsevier Patient Education ©  2020 [employer] ° °

## 2019-11-07 NOTE — Progress Notes (Signed)
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 Bowel-gas pattern consistent with small bowel obstruction. No free air. 2. No acute chest findings.   01/23/2019 Imaging   CT AP IMPRESSION: Obstructing mid transverse colonic mass with mild regional adenopathy and hepatic metastatic disease. The mass likely extends through the serosa; no ascites or peritoneal nodularity.   01/24/2019 Surgery   Surgeon: Clovis Riley MD Assistant: [name] Procedure performed: Transverse colectomy with end colostomy, liver biopsy Procedure classification: URGENT/EMERGENT Preop diagnosis: Obstructing, metastatic transverse colon mass Post-op diagnosis/intraop findings: Same   01/24/2019 Pathology Results   FINAL MICROSCOPIC DIAGNOSIS:   A. COLON, TRANSVERSE, RESECTION:  Colonic adenocarcinoma, 5 cm.  Carcinoma extends into pericolonic connective tissue and focally to  serosal surface.  Margins not involved.  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   B. LIVER NODULE, LEFT, BIOPSY:  Metastatic adenocarcinoma.    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 Hypermetabolic metastatic disease in the liver and mediastinal/hilar/axillary lymph nodes. 2. Focal hypermetabolism in the rectum. Continued attention on follow-up exams is warranted. 3. Focal hypermetabolism medial to the right adrenal gland may be within a metastatic lymph node, better visualized on 01/23/2019. 4. Aortic atherosclerosis (ICD10-170.0). Coronary artery calcification.   03/12/2019 -  Chemotherapy   She started 5FU q2weeks on 03/12/19 for 2 cycles. She started full dose FOLFOX with Avastin on 04/09/19. Oxaliplatin dose reduced repeatedly due to neuropathy C12 and held since C16 on 10/09/19. Now on maintenance Avastin and 5FU q2weeks since 10/09/19   05/31/2019 Imaging   Restaging CT CAP IMPRESSION: 1. Similar to mild interval decrease in size of multiple hepatic lesions, partially calcified. 2. 2 mm right upper lobe pulmonary nodule. Recommend attention on follow-up. 3. Emphysema and aortic atherosclerosis.   08/23/2019 Imaging   CT CAP w contrast  IMPRESSION: 1. The dominant peripheral right liver metastasis has mildly increased. Other smaller liver metastases are stable. 2. Otherwise no new or progressive metastatic disease in the chest, abdomen or pelvis. 3. Aortic Atherosclerosis (ICD10-I70.0) and Emphysema (ICD10-J43.9).      CURRENT THERAPY:  Maintenance Avastin and 5FU q2weeks since 10/09/19   INTERVAL HISTORY:  Casey Black is here for a follow up and treatment. She presents to the clinic alone. She notes her neuropathy has progressed in her feet and up her legs and in her hands. She is on Gabapentin 179m in the AM and 3065min the PM. She mainly has numbness  and no pain. She notes improvement in her neuropathy on  Gabapentin and no drowsiness so far. She is able to sleep in the night. She notes very mild balance issues, but ambulating adequately.     REVIEW OF SYSTEMS:   Constitutional: Denies fevers, chills or abnormal weight loss Eyes: Denies blurriness of vision Ears, nose, mouth, throat, and face: Denies mucositis or sore throat Respiratory: Denies cough, dyspnea or wheezes Cardiovascular: Denies palpitation, chest discomfort or lower extremity swelling Gastrointestinal:  Denies nausea, heartburn or change in bowel habits Skin: Denies abnormal skin rashes Lymphatics: Denies new lymphadenopathy or easy bruising Neurological: (+) Numbness in her feet and up her legs and tingling in her hands.  Behavioral/Psych: Mood is stable, no new changes  All other systems were reviewed with the patient and are negative.  MEDICAL HISTORY:  Past Medical History:  Diagnosis Date  . Anemia    low iron  . Cancer [facility])    colon cancer  . Colon cancer ([place]) 01/2019  . Hypertension 01/23/2019  . Personal history of chemotherapy 01/2019   colon CA  . SBO (small bowel obstruction) ([place]) 01/23/2019    SURGICAL HISTORY: Past Surgical History:  Procedure Laterality Date  . CESAREAN SECTION     x2  . COLOSTOMY N/A 01/24/2019   Procedure: End Loop Colostomy;  Surgeon: Clovis Riley, MD;  Location: [place];  Service: General;  Laterality: N/A;  . PARTIAL COLECTOMY N/A 01/24/2019   Procedure: PARTIAL COLECTOMY;  Surgeon: Clovis Riley, MD;  Location: [place];  Service: General;  Laterality: N/A;  . PORTACATH PLACEMENT Right 02/28/2019   Procedure: INSERTION PORT-A-CATH WITH ULTRASOUND GUIDANCE;  Surgeon: Clovis Riley, MD;  Location: [place];  Service: General;  Laterality: Right;    I have reviewed the social history and family history with the patient and they are unchanged from previous note.  ALLERGIES:  has No Known Allergies.  MEDICATIONS:  Current Outpatient Medications  Medication Sig  Dispense Refill  . acetaminophen (TYLENOL) 325 MG tablet Take 2 tablets (650 mg total) by mouth every 6 (six) hours as needed.    Marland Kitchen amLODipine (NORVASC) 10 MG tablet TAKE 1 TABLET BY MOUTH EVERY DAY 30 tablet 1  . ferrous sulfate 325 (65 FE) MG tablet TAKE 1 TABLET (325 MG TOTAL) BY MOUTH 2 (TWO) TIMES DAILY WITH A MEAL. 180 tablet 1  . gabapentin (NEURONTIN) 100 MG capsule Take 2 capsules (200 mg total) by mouth 2 (two) times daily. 90 capsule 1  . gabapentin (NEURONTIN) 300 MG capsule Take 1 capsule (300 mg total) by mouth at bedtime. 30 capsule 1  . hydrALAZINE (APRESOLINE) 25 MG tablet TAKE 1 TABLET (25 MG TOTAL) BY MOUTH EVERY 8 (EIGHT) HOURS. 90 tablet 1  . hydrochlorothiazide (HYDRODIURIL) 12.5 MG tablet TAKE 1 TABLET BY MOUTH EVERY DAY 30 tablet 1  . hydrocortisone (ANUSOL-HC) 2.5 % rectal cream Place 1 application rectally 2 (two) times daily. 30 g 0  . Ibuprofen (ADVIL) 200 MG CAPS Take 400 mg by mouth daily as needed (pain).    . isosorbide mononitrate (IMDUR) 30 MG 24 hr tablet TAKE 1 TABLET BY MOUTH EVERY DAY 90 tablet 0  . lidocaine (LMX) 4 % cream Apply 1 application topically 3 (three) times daily as needed. 30 g 0  . lidocaine-prilocaine (EMLA) cream Apply to affected area once (Patient taking differently: Apply 1 application topically once. Apply to affected area once) 30 g 3  . lisinopril (ZESTRIL) 40 MG tablet TAKE  1 TABLET BY MOUTH EVERY DAY 90 tablet 1  . loperamide (IMODIUM) 2 MG capsule TAKE 1 CAPSULE (2 MG TOTAL) BY MOUTH AS NEEDED FOR DIARRHEA OR LOOSE STOOLS. 90 capsule 0  . metoprolol tartrate (LOPRESSOR) 50 MG tablet TAKE 1 TABLET BY MOUTH TWICE A DAY 180 tablet 1  . metroNIDAZOLE (FLAGYL) 250 MG tablet Take 1 tablet (250 mg total) by mouth 3 (three) times daily. 1 tablet 0  . ondansetron (ZOFRAN) 4 MG tablet Take 1 tablet (4 mg total) by mouth every 6 (six) hours as needed for nausea. 30 tablet 0  . ondansetron (ZOFRAN) 8 MG tablet Take 1 tablet (8 mg total) by mouth 2  (two) times daily as needed for refractory nausea / vomiting. Start on day 3 after chemotherapy. 30 tablet 1  . oxyCODONE-acetaminophen (PERCOCET/ROXICET) 5-325 MG tablet Take 1-2 tablets by mouth every 6 (six) hours as needed for severe pain. 20 tablet 0  . polycarbophil (FIBERCON) 625 MG tablet Take 1 tablet (625 mg total) by mouth daily. 30 tablet 0  . prochlorperazine (COMPAZINE) 10 MG tablet Take 1 tablet (10 mg total) by mouth every 6 (six) hours as needed (Nausea or vomiting). 30 tablet 1   No current facility-administered medications for this visit.    PHYSICAL EXAMINATION: ECOG PERFORMANCE STATUS: 1 - Symptomatic but completely ambulatory  Vitals:   11/12/19 1024 11/12/19 1026  BP: (!) 163/107 (!) 166/65  Pulse: 60   Resp: 17   Temp: (!) 96.6 F (35.9 C)   SpO2: 99%    Filed Weights   11/12/19 1024  Weight: 200 lb 4.8 oz (90.9 kg)    Due to COVID19 we will limit examination to appearance. Patient had no complaints.  GENERAL:alert, no distress and comfortable SKIN: skin color normal, no rashes or significant lesions EYES: normal, Conjunctiva are pink and non-injected, sclera clear  NEURO: alert & oriented x 3 with fluent speech   LABORATORY DATA:  I have reviewed the data as listed CBC Latest Ref Rng & Units 11/12/2019 10/30/2019 10/09/2019  WBC 4.0 - 10.5 K/uL 5.7 4.6 4.0  Hemoglobin 12.0 - 15.0 g/dL 10.5(L) 10.6(L) 9.8(L)  Hematocrit 36 - 46 % 33.3(L) 33.9(L) 30.5(L)  Platelets 150 - 400 K/uL 120(L) 175 136(L)     CMP Latest Ref Rng & Units 10/30/2019 10/09/2019 09/24/2019  Glucose 70 - 99 mg/dL 76 79 73  BUN 6 - 20 mg/dL 26(H) 19 25(H)  Creatinine 0.44 - 1.00 mg/dL 1.07(H) 1.06(H) 1.29(H)  Sodium 135 - 145 mmol/L 139 138 141  Potassium 3.5 - 5.1 mmol/L 5.0 4.0 4.7  Chloride 98 - 111 mmol/L 115(H) 110 114(H)  CO2 22 - 32 mmol/L 19(L) 20(L) 19(L)  Calcium 8.9 - 10.3 mg/dL 10.2 9.8 9.4  Total Protein 6.5 - 8.1 g/dL 7.7 7.4 7.1  Total Bilirubin 0.3 - 1.2 mg/dL 0.3 0.3  0.2(L)  Alkaline Phos 38 - 126 U/L 159(H) 138(H) 80  AST 15 - 41 U/L 31 54(H) 20  ALT 0 - 44 U/L 32 63(H) 23      RADIOGRAPHIC STUDIES: I have personally reviewed the radiological images as listed and agreed with the findings in the report. No results found.   ASSESSMENT & PLAN:  Casey Black is a 59 y.o. female with    1. Adenocarcinoma of transverse colon, moderately differentiated, pT4aN1aM1a stage IV with liverand nodalmetastasis,MSS, KRAS G12S(+) -Shewas diagnosed in 01/2019 afteremergent colectomy and liver biopsy. Pathology showed stage IV colonic adenocarcinoma metastatic to liver.11/23/20PET showsknown  liver metastasis and metastatic lymphadenopathy in chest and right axilla.  -Wepreviouslydiscussed her cancer is stage IV and no longer eligible for surgery. The likelihood of curing this is very lowdue to her diffuse metastasis, but still very treatable. -[name] shows MSI stable disease, Kras+. There are no targetable mutation, she is not a candidate for EGFRinhibitoror immunotherapy.Avastin was added with start of full dose FOLFOX3 weeks ago. -I started her on first-line5-FU for 2 cycles on 03/11/20 while her surgical wound healed. She started full doseFOLFOXwith avastin q2weeks on 1/4/21to control her disease.She developed neuropathy after cycle 12, oxali dose reduced to 70 mg/m2 with cycle 13. Symptoms progressed and further reduced to 40 mg/m2 from cycle 14and 15. Held since C16 on 10/09/19 and now on maintenance therapy with Avastin and 5FU q2weeks.  -Her CT CAP from 08/23/19 showed slight 15% increased in some liver lesions, but mostly stable disease. Will continue treatment for now and If there is further progression on next scan, will change her treatment at that time to FOLFIRI. -S/p C17 her neuropathy has improved on Gabapentin. I reviewed use with her. Labs reviewed and adequate to proceed with Avastin and 5FU today.  -I discussed this option  of changing 5FU to oral Xeloda. She opted to continue 5FU for now.  -f/u in 2 weeks with CT scan review.    2. 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 She has moderate sensory deficit in her feet but still able to function and ambulate well. No pain or sensitivity  -Oxaliplatin furtherdose reducedto 40 mg/m2 cycle 14, persistent for cycle 15 and Oxaliplatin is being held from cycle 16 -She is currently on gabapentin 100 mg AM and 300 mg qHS starting 10/30/19. This has helped. I refilled both 198m for daytime and 3064mfor nighttime (11/12/19) and if needed she can titrate up 10049mo 200m70md during day  -I encouraged her to watch her balance and her driving.    3.Nutrition/Anorexia  -she reportedly weighed 253 lbs a few years ago, and weighed 203 lbs at symptom onset~11/2018 -190 lbsin 02/2019.  -Continue to f/u withdietician -She has been able to gain weight on chemo; continue monitoring -Given eating well on her own and weight gain, will stop her pre-med dexa   4. Iron deficiency anemia, and anemia secondary to cancer -She had low ferritinand low TIBC -Takes oral iron BID, will continue.Anemia continues to improve. -Will give IV iron if needed -Hg at 10.5 today (11/12/19)  5. HTN, uncontrolled -on hydralazine, isosorbide, lisinopril, metoprolol. I started her on amlodipine on 07/16/19 and HCTZ on 07/30/19. There has been improvement.  -I discussed avastin can increase her blood pressure.Continue to monitor  -BP at 166/65 today (11/12/19). She notes it is only elevated in clinic and normal at home, will continue to monitor.   6. Goals of care discussion, Social support -The patient understands the goal of care is palliative. -she is full code  now -She worked in the [employer] different jobs, but has been out of work lately due to her symptoms  -She is not getting disability, no pay, but her employer kept her on their insurance plan -She may be eligible for Medicaid  7. Rectal pain, Hemorrhoids, Constipation  -She has had mildly bleeding external hemorrhoids lately, which has caused her pain. She  was to f/u with her surgeon Dr. Hector Brunswick  -I also discussed she can see GI for possible hemorrhoids banding.  -Due to rectal pain flare she is taking Tylenol. For constipation she has Miralax   PLAN: -I filled 155m and 3082mGabapentin today  -Labs reviewed and adequate to proceed with C18 Avastin and 5FUtoday, stop premed dexa due to her weight gain  -F/u in 2 weeks with CT CAP w contrast a few days before    No problem-specific Assessment & Plan notes found for this encounter.   Orders Placed This Encounter  Procedures  . CT Abdomen Pelvis W Contrast    Standing Status:   Future    Standing Expiration Date:   11/11/2020    Order Specific Question:   If indicated for the ordered procedure, I authorize the administration of contrast media per Radiology protocol    Answer:   Yes    Order Specific Question:   Is patient pregnant?    Answer:   No    Order Specific Question:   [facility]    [facility]  Order Specific Question:   Release to patient    Answer:   Immediate    Order Specific Question:   Is Oral Contrast requested for this exam?    Answer:   Yes, Per Radiology protocol    Order Specific Question:   Radiology Contrast Protocol - do NOT remove file path    Answer:   \\[email or web address]  . CT Chest W Contrast    Standing Status:   Future    Standing Expiration Date:   11/11/2020    Order Specific Question:   If indicated for the ordered procedure, I authorize the administration of contrast media per Radiology protocol    Answer:   Yes    Order  Specific Question:   Is patient pregnant?    Answer:   No    Order Specific Question:   [facility]  Order Specific Question:   Radiology Contrast Protocol - do NOT remove file path    Answer:   \\[email or web address]   All questions were answered. The patient knows to call the clinic with any problems, questions or concerns. No barriers to learning was detected. The total time spent in the appointment was 30 minutes.     YaTruitt MerleMD 11/12/2019   I, AmJoslyn Devonam acting as scribe for YaTruitt MerleMD.

## 2019-11-10 DIAGNOSIS — C189 Malignant neoplasm of colon, unspecified: Secondary | ICD-10-CM | POA: Diagnosis not present

## 2019-11-12 ENCOUNTER — Inpatient Hospital Stay: Payer: BC Managed Care – PPO | Attending: Nurse Practitioner

## 2019-11-12 ENCOUNTER — Inpatient Hospital Stay (HOSPITAL_BASED_OUTPATIENT_CLINIC_OR_DEPARTMENT_OTHER): Payer: BC Managed Care – PPO | Admitting: Hematology

## 2019-11-12 ENCOUNTER — Telehealth: Payer: Self-pay | Admitting: Hematology

## 2019-11-12 ENCOUNTER — Other Ambulatory Visit: Payer: Self-pay

## 2019-11-12 ENCOUNTER — Inpatient Hospital Stay: Payer: BC Managed Care – PPO

## 2019-11-12 ENCOUNTER — Other Ambulatory Visit: Payer: Self-pay | Admitting: *Deleted

## 2019-11-12 ENCOUNTER — Encounter: Payer: Self-pay | Admitting: Hematology

## 2019-11-12 ENCOUNTER — Other Ambulatory Visit: Payer: Self-pay | Admitting: Nurse Practitioner

## 2019-11-12 VITALS — BP 166/65 | HR 60 | Temp 96.6°F | Resp 17 | Ht 67.0 in | Wt 200.3 lb

## 2019-11-12 DIAGNOSIS — Z5112 Encounter for antineoplastic immunotherapy: Secondary | ICD-10-CM | POA: Insufficient documentation

## 2019-11-12 DIAGNOSIS — Z5111 Encounter for antineoplastic chemotherapy: Secondary | ICD-10-CM | POA: Insufficient documentation

## 2019-11-12 DIAGNOSIS — C184 Malignant neoplasm of transverse colon: Secondary | ICD-10-CM | POA: Diagnosis not present

## 2019-11-12 DIAGNOSIS — C773 Secondary and unspecified malignant neoplasm of axilla and upper limb lymph nodes: Secondary | ICD-10-CM | POA: Diagnosis not present

## 2019-11-12 DIAGNOSIS — Z7189 Other specified counseling: Secondary | ICD-10-CM

## 2019-11-12 DIAGNOSIS — C189 Malignant neoplasm of colon, unspecified: Secondary | ICD-10-CM

## 2019-11-12 DIAGNOSIS — C787 Secondary malignant neoplasm of liver and intrahepatic bile duct: Secondary | ICD-10-CM | POA: Insufficient documentation

## 2019-11-12 DIAGNOSIS — G620 Drug-induced polyneuropathy: Secondary | ICD-10-CM

## 2019-11-12 DIAGNOSIS — Z95828 Presence of other vascular implants and grafts: Secondary | ICD-10-CM

## 2019-11-12 LAB — CBC WITH DIFFERENTIAL (CANCER CENTER ONLY)
Abs Immature Granulocytes: 0.01 10*3/uL (ref 0.00–0.07)
Basophils Absolute: 0.1 10*3/uL (ref 0.0–0.1)
Basophils Relative: 1 %
Eosinophils Absolute: 0.2 10*3/uL (ref 0.0–0.5)
Eosinophils Relative: 4 %
HCT: 33.3 % — ABNORMAL LOW (ref 36.0–46.0)
Hemoglobin: 10.5 g/dL — ABNORMAL LOW (ref 12.0–15.0)
Immature Granulocytes: 0 %
Lymphocytes Relative: 31 %
Lymphs Abs: 1.8 10*3/uL (ref 0.7–4.0)
MCH: 30.5 pg (ref 26.0–34.0)
MCHC: 31.5 g/dL (ref 30.0–36.0)
MCV: 96.8 fL (ref 80.0–100.0)
Monocytes Absolute: 0.6 10*3/uL (ref 0.1–1.0)
Monocytes Relative: 10 %
Neutro Abs: 3.1 10*3/uL (ref 1.7–7.7)
Neutrophils Relative %: 54 %
Platelet Count: 120 10*3/uL — ABNORMAL LOW (ref 150–400)
RBC: 3.44 MIL/uL — ABNORMAL LOW (ref 3.87–5.11)
RDW: 15.2 % (ref 11.5–15.5)
WBC Count: 5.7 10*3/uL (ref 4.0–10.5)
nRBC: 0 % (ref 0.0–0.2)

## 2019-11-12 LAB — CMP (CANCER CENTER ONLY)
ALT: 23 U/L (ref 0–44)
AST: 30 U/L (ref 15–41)
Albumin: 3.5 g/dL (ref 3.5–5.0)
Alkaline Phosphatase: 120 U/L (ref 38–126)
Anion gap: 6 (ref 5–15)
BUN: 23 mg/dL — ABNORMAL HIGH (ref 6–20)
CO2: 20 mmol/L — ABNORMAL LOW (ref 22–32)
Calcium: 10.2 mg/dL (ref 8.9–10.3)
Chloride: 111 mmol/L (ref 98–111)
Creatinine: 1.04 mg/dL — ABNORMAL HIGH (ref 0.44–1.00)
GFR, Est AFR Am: >60 mL/min (ref >60)
GFR, Estimated: 59 mL/min — ABNORMAL LOW (ref >60)
Glucose, Bld: 75 mg/dL (ref 70–99)
Potassium: 4.6 mmol/L (ref 3.5–5.1)
Sodium: 137 mmol/L (ref 135–145)
Total Bilirubin: 0.4 mg/dL (ref 0.3–1.2)
Total Protein: 7.6 g/dL (ref 6.5–8.1)

## 2019-11-12 LAB — CEA (IN HOUSE-CHCC): CEA (CHCC-In House): 59.4 ng/mL — ABNORMAL HIGH (ref 0.00–5.00)

## 2019-11-12 LAB — IRON AND TIBC
Iron: 139 ug/dL (ref 41–142)
Saturation Ratios: 48 % (ref 21–57)
TIBC: 290 ug/dL (ref 236–444)
UIBC: 152 ug/dL (ref 120–384)

## 2019-11-12 LAB — TOTAL PROTEIN, URINE DIPSTICK: Protein, ur: NEGATIVE mg/dL

## 2019-11-12 LAB — FERRITIN: Ferritin: 169 ng/mL (ref 11–307)

## 2019-11-12 MED ORDER — SODIUM CHLORIDE 0.9 % IV SOLN
5.0000 mg/kg | Freq: Once | INTRAVENOUS | Status: AC
  Administered 2019-11-12: 400 mg via INTRAVENOUS
  Filled 2019-11-12: qty 16

## 2019-11-12 MED ORDER — SODIUM CHLORIDE 0.9 % IV SOLN
2470.0000 mg/m2 | INTRAVENOUS | Status: DC
  Administered 2019-11-12: 5000 mg via INTRAVENOUS
  Filled 2019-11-12: qty 100

## 2019-11-12 MED ORDER — ISOSORBIDE MONONITRATE ER 30 MG PO TB24: 30.0000 mg | ORAL_TABLET | Freq: Every day | ORAL | 0 refills | Status: DC

## 2019-11-12 MED ORDER — SODIUM CHLORIDE 0.9% FLUSH
10.0000 mL | INTRAVENOUS | Status: DC | PRN
  Administered 2019-11-12: 10 mL
  Filled 2019-11-12: qty 10

## 2019-11-12 MED ORDER — GABAPENTIN 100 MG PO CAPS: 200.0000 mg | ORAL_CAPSULE | Freq: Two times a day (BID) | ORAL | 1 refills | Status: DC

## 2019-11-12 MED ORDER — PALONOSETRON HCL INJECTION 0.25 MG/5ML
0.2500 mg | Freq: Once | INTRAVENOUS | Status: AC
  Administered 2019-11-12: 0.25 mg via INTRAVENOUS

## 2019-11-12 MED ORDER — GABAPENTIN 300 MG PO CAPS
300.0000 mg | ORAL_CAPSULE | Freq: Every day | ORAL | 1 refills | Status: DC
Start: 2019-11-12 — End: 2020-03-24

## 2019-11-12 MED ORDER — LEUCOVORIN CALCIUM INJECTION 350 MG
400.0000 mg/m2 | Freq: Once | INTRAVENOUS | Status: AC
  Administered 2019-11-12: 808 mg via INTRAVENOUS
  Filled 2019-11-12: qty 40.4

## 2019-11-12 MED ORDER — PALONOSETRON HCL INJECTION 0.25 MG/5ML
INTRAVENOUS | Status: AC
  Filled 2019-11-12: qty 5

## 2019-11-12 MED ORDER — SODIUM CHLORIDE 0.9 % IV SOLN
Freq: Once | INTRAVENOUS | Status: AC
  Filled 2019-11-12: qty 250

## 2019-11-12 NOTE — Patient Instructions (Signed)
[facility] Discharge Instructions for Patients Receiving Chemotherapy  Today you received the following chemotherapy agents Bevacizumab (ZIRABEV), Leucovorin & Flourouracil (ADRUCIL).  To help prevent nausea and vomiting after your treatment, we encourage you to take your nausea medication as prescribed.   If you develop nausea and vomiting that is not controlled by your nausea medication, call the clinic.   BELOW ARE SYMPTOMS THAT SHOULD BE REPORTED IMMEDIATELY:  *FEVER GREATER THAN 100.5 F  *CHILLS WITH OR WITHOUT FEVER  NAUSEA AND VOMITING THAT IS NOT CONTROLLED WITH YOUR NAUSEA MEDICATION  *UNUSUAL SHORTNESS OF BREATH  *UNUSUAL BRUISING OR BLEEDING  TENDERNESS IN MOUTH AND THROAT WITH OR WITHOUT PRESENCE OF ULCERS  *URINARY PROBLEMS  *BOWEL PROBLEMS  UNUSUAL RASH Items with * indicate a potential emergency and should be followed up as soon as possible.  Feel free to call the clinic should you have any questions or concerns. The clinic phone number is [phone].  Please show the [facility] at check-in to the Emergency Department and triage nurse.

## 2019-11-12 NOTE — Patient Instructions (Signed)

## 2019-11-12 NOTE — Telephone Encounter (Signed)
Scheduled appointments per 8/9 LOS. Patient is aware of appointments dates and times. I also printed out an updated calendar for the patient at time of service.

## 2019-11-14 ENCOUNTER — Inpatient Hospital Stay: Payer: BC Managed Care – PPO

## 2019-11-14 ENCOUNTER — Other Ambulatory Visit: Payer: Self-pay

## 2019-11-14 VITALS — BP 151/65 | HR 47 | Temp 99.6°F | Resp 18

## 2019-11-14 DIAGNOSIS — G620 Drug-induced polyneuropathy: Secondary | ICD-10-CM | POA: Diagnosis not present

## 2019-11-14 DIAGNOSIS — Z5111 Encounter for antineoplastic chemotherapy: Secondary | ICD-10-CM | POA: Diagnosis not present

## 2019-11-14 DIAGNOSIS — C184 Malignant neoplasm of transverse colon: Secondary | ICD-10-CM | POA: Diagnosis not present

## 2019-11-14 DIAGNOSIS — Z7189 Other specified counseling: Secondary | ICD-10-CM

## 2019-11-14 DIAGNOSIS — Z5112 Encounter for antineoplastic immunotherapy: Secondary | ICD-10-CM | POA: Diagnosis not present

## 2019-11-14 DIAGNOSIS — C787 Secondary malignant neoplasm of liver and intrahepatic bile duct: Secondary | ICD-10-CM | POA: Diagnosis not present

## 2019-11-14 DIAGNOSIS — C189 Malignant neoplasm of colon, unspecified: Secondary | ICD-10-CM

## 2019-11-14 DIAGNOSIS — C773 Secondary and unspecified malignant neoplasm of axilla and upper limb lymph nodes: Secondary | ICD-10-CM | POA: Diagnosis not present

## 2019-11-14 MED ORDER — SODIUM CHLORIDE 0.9% FLUSH
10.0000 mL | INTRAVENOUS | Status: DC | PRN
  Administered 2019-11-14: 10 mL
  Filled 2019-11-14: qty 10

## 2019-11-14 MED ORDER — HEPARIN SOD (PORK) LOCK FLUSH 100 UNIT/ML IV SOLN
500.0000 [IU] | Freq: Once | INTRAVENOUS | Status: AC | PRN
  Administered 2019-11-14: 500 [IU]
  Filled 2019-11-14: qty 5

## 2019-11-15 DIAGNOSIS — K56609 Unspecified intestinal obstruction, unspecified as to partial versus complete obstruction: Secondary | ICD-10-CM | POA: Diagnosis not present

## 2019-11-15 DIAGNOSIS — Z933 Colostomy status: Secondary | ICD-10-CM | POA: Diagnosis not present

## 2019-11-18 ENCOUNTER — Other Ambulatory Visit: Payer: Self-pay | Admitting: Internal Medicine

## 2019-11-22 ENCOUNTER — Other Ambulatory Visit: Payer: Self-pay

## 2019-11-22 ENCOUNTER — Inpatient Hospital Stay: Payer: BC Managed Care – PPO

## 2019-11-22 DIAGNOSIS — G620 Drug-induced polyneuropathy: Secondary | ICD-10-CM | POA: Diagnosis not present

## 2019-11-22 DIAGNOSIS — Z5111 Encounter for antineoplastic chemotherapy: Secondary | ICD-10-CM | POA: Diagnosis not present

## 2019-11-22 DIAGNOSIS — C189 Malignant neoplasm of colon, unspecified: Secondary | ICD-10-CM

## 2019-11-22 DIAGNOSIS — Z95828 Presence of other vascular implants and grafts: Secondary | ICD-10-CM

## 2019-11-22 DIAGNOSIS — C773 Secondary and unspecified malignant neoplasm of axilla and upper limb lymph nodes: Secondary | ICD-10-CM | POA: Diagnosis not present

## 2019-11-22 DIAGNOSIS — C184 Malignant neoplasm of transverse colon: Secondary | ICD-10-CM | POA: Diagnosis not present

## 2019-11-22 DIAGNOSIS — Z5112 Encounter for antineoplastic immunotherapy: Secondary | ICD-10-CM | POA: Diagnosis not present

## 2019-11-22 DIAGNOSIS — C787 Secondary malignant neoplasm of liver and intrahepatic bile duct: Secondary | ICD-10-CM | POA: Diagnosis not present

## 2019-11-22 LAB — CBC WITH DIFFERENTIAL (CANCER CENTER ONLY)
Abs Immature Granulocytes: 0.01 10*3/uL (ref 0.00–0.07)
Basophils Absolute: 0 10*3/uL (ref 0.0–0.1)
Basophils Relative: 1 %
Eosinophils Absolute: 0.2 10*3/uL (ref 0.0–0.5)
Eosinophils Relative: 4 %
HCT: 34.1 % — ABNORMAL LOW (ref 36.0–46.0)
Hemoglobin: 10.8 g/dL — ABNORMAL LOW (ref 12.0–15.0)
Immature Granulocytes: 0 %
Lymphocytes Relative: 50 %
Lymphs Abs: 2.1 10*3/uL (ref 0.7–4.0)
MCH: 30.8 pg (ref 26.0–34.0)
MCHC: 31.7 g/dL (ref 30.0–36.0)
MCV: 97.2 fL (ref 80.0–100.0)
Monocytes Absolute: 0.6 10*3/uL (ref 0.1–1.0)
Monocytes Relative: 14 %
Neutro Abs: 1.3 10*3/uL — ABNORMAL LOW (ref 1.7–7.7)
Neutrophils Relative %: 31 %
Platelet Count: 172 10*3/uL (ref 150–400)
RBC: 3.51 MIL/uL — ABNORMAL LOW (ref 3.87–5.11)
RDW: 15.7 % — ABNORMAL HIGH (ref 11.5–15.5)
WBC Count: 4.2 10*3/uL (ref 4.0–10.5)
nRBC: 0 % (ref 0.0–0.2)

## 2019-11-22 LAB — CMP (CANCER CENTER ONLY)
ALT: 31 U/L (ref 0–44)
AST: 32 U/L (ref 15–41)
Albumin: 3.5 g/dL (ref 3.5–5.0)
Alkaline Phosphatase: 139 U/L — ABNORMAL HIGH (ref 38–126)
Anion gap: 6 (ref 5–15)
BUN: 27 mg/dL — ABNORMAL HIGH (ref 6–20)
CO2: 20 mmol/L — ABNORMAL LOW (ref 22–32)
Calcium: 10.4 mg/dL — ABNORMAL HIGH (ref 8.9–10.3)
Chloride: 111 mmol/L (ref 98–111)
Creatinine: 0.9 mg/dL (ref 0.44–1.00)
GFR, Est AFR Am: >60 mL/min (ref >60)
GFR, Estimated: >60 mL/min (ref >60)
Glucose, Bld: 86 mg/dL (ref 70–99)
Potassium: 4.8 mmol/L (ref 3.5–5.1)
Sodium: 137 mmol/L (ref 135–145)
Total Bilirubin: 0.3 mg/dL (ref 0.3–1.2)
Total Protein: 7.6 g/dL (ref 6.5–8.1)

## 2019-11-22 LAB — TOTAL PROTEIN, URINE DIPSTICK: Protein, ur: NEGATIVE mg/dL

## 2019-11-22 MED ORDER — HEPARIN SOD (PORK) LOCK FLUSH 100 UNIT/ML IV SOLN
500.0000 [IU] | Freq: Once | INTRAVENOUS | Status: AC | PRN
  Administered 2019-11-22: 500 [IU]
  Filled 2019-11-22: qty 5

## 2019-11-22 MED ORDER — SODIUM CHLORIDE 0.9% FLUSH
10.0000 mL | INTRAVENOUS | Status: DC | PRN
  Administered 2019-11-22: 10 mL
  Filled 2019-11-22: qty 10

## 2019-11-23 ENCOUNTER — Telehealth: Payer: Self-pay

## 2019-11-23 ENCOUNTER — Other Ambulatory Visit: Payer: Self-pay

## 2019-11-23 DIAGNOSIS — C787 Secondary malignant neoplasm of liver and intrahepatic bile duct: Secondary | ICD-10-CM

## 2019-11-23 DIAGNOSIS — C189 Malignant neoplasm of colon, unspecified: Secondary | ICD-10-CM

## 2019-11-23 NOTE — Telephone Encounter (Signed)
I spoke with Casey Black,. She is not taking any calcium supplement nor is she on a multivitamin.  I encouraged her to increase her water intake.  I reviewed neutropenic precautions and told her to contact us if she developes a fever greater than 100.5, chills, or other signs of infection.  She verbalized understanding.

## 2019-11-23 NOTE — Telephone Encounter (Signed)
-----   Message from [name], NP sent at 11/23/2019  2:08 AM EDT ----- Please let her know she is mildly neutropenic and calcium is elevated. Avoid extra calcium supplement and drink more water. Review neutropenic precautions.  Thanks, Regan Rakers

## 2019-11-25 NOTE — Progress Notes (Signed)
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 Bowel-gas pattern consistent with small bowel obstruction. No free air. 2. No acute chest findings.   01/23/2019 Imaging   CT AP IMPRESSION: Obstructing mid transverse colonic mass with mild regional adenopathy and hepatic metastatic disease. The mass likely extends through the serosa; no ascites or peritoneal nodularity.   01/24/2019 Surgery   Surgeon: Casey Riley MD Assistant: Casey Latino PA-C Procedure performed: Transverse colectomy with end colostomy, liver biopsy Procedure classification: URGENT/EMERGENT Preop diagnosis: Obstructing, metastatic transverse colon mass Post-op diagnosis/intraop findings: Same   01/24/2019 Pathology Results   FINAL MICROSCOPIC DIAGNOSIS:   A. COLON, TRANSVERSE, RESECTION:  Colonic adenocarcinoma, 5 cm.  Carcinoma extends into pericolonic connective tissue and focally to  serosal surface.  Margins not involved.  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   B. LIVER NODULE, LEFT, BIOPSY:  Metastatic adenocarcinoma.    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 Hypermetabolic metastatic disease in the liver and mediastinal/hilar/axillary lymph nodes. 2. Focal hypermetabolism in the rectum. Continued attention on follow-up exams is warranted. 3. Focal hypermetabolism medial to the right adrenal gland may be within a metastatic lymph node, better visualized on 01/23/2019. 4. Aortic atherosclerosis (ICD10-170.0). Coronary artery calcification.   03/12/2019 -  Chemotherapy   She started 5FU q2weeks on 03/12/19 for 2 cycles. She started full dose FOLFOX with Avastin on 04/09/19. Oxaliplatin dose reduced repeatedly due to neuropathy C12 and held since C16 on 10/09/19. Now on maintenance Avastin and 5FU q2weeks since 10/09/19   05/31/2019 Imaging   Restaging CT CAP IMPRESSION: 1. Similar to mild interval decrease in size of multiple hepatic lesions, partially calcified. 2. 2 mm right upper lobe pulmonary nodule. Recommend attention on follow-up. 3. Emphysema and aortic atherosclerosis.   08/23/2019 Imaging   CT CAP w contrast  IMPRESSION: 1. The dominant peripheral right liver metastasis has mildly increased. Other smaller liver metastases are stable. 2. Otherwise no new or progressive metastatic disease in the chest, abdomen or pelvis. 3. Aortic Atherosclerosis (ICD10-I70.0) and Emphysema (ICD10-J43.9).     CURRENT THERAPY: Maintenance Avastin and 5FU q2weeks since 10/09/19  INTERVAL HISTORY: [name] returns for follow up and treatment as scheduled. She completed another cycle of 5FU/avastin on 11/12/19. She had labs last week for upcoming CT that showed mild neutropenia and hypercalcemia.   She is doing well today.  Beginning last week she has left ankle and foot swelling, denies calf pain.  This improves overnight.  She has a  history of left  greater than right extremity swelling that resolved for a long time but has started again.  She is otherwise without complaints.  Takes MiraLAX as needed for constipation, no nausea or vomiting.  Denies bleeding, abdominal pain, fever, chills, cough, chest pain, dyspnea.  She continues to have neuropathy but is "getting used to it."   MEDICAL HISTORY:  Past Medical History:  Diagnosis Date  . Anemia    low iron  . Cancer [facility])    colon cancer  . Colon cancer ([place]) 01/2019  . Hypertension 01/23/2019  . Personal history of chemotherapy 01/2019   colon CA  . SBO (small bowel obstruction) ([place]) 01/23/2019    SURGICAL HISTORY: Past Surgical History:  Procedure Laterality Date  . CESAREAN SECTION     x2  . COLOSTOMY N/A 01/24/2019   Procedure: End Loop Colostomy;  Surgeon: Casey Riley, MD;  Location: [place];  Service: General;  Laterality: N/A;  . PARTIAL COLECTOMY N/A 01/24/2019   Procedure: PARTIAL COLECTOMY;  Surgeon: Casey Riley, MD;  Location: [place];  Service: General;  Laterality: N/A;  . PORTACATH PLACEMENT Right 02/28/2019   Procedure: INSERTION PORT-A-CATH WITH ULTRASOUND GUIDANCE;  Surgeon: Casey Riley, MD;  Location: [place];  Service: General;  Laterality: Right;    I have reviewed the social history and family history with the patient and they are unchanged from previous note.  ALLERGIES:  has No Known Allergies.  MEDICATIONS:  Current Outpatient Medications  Medication Sig Dispense Refill  . acetaminophen (TYLENOL) 325 MG tablet Take 2 tablets (650 mg total) by mouth every 6 (six) hours as needed.    Marland Kitchen amLODipine (NORVASC) 10 MG tablet TAKE 1 TABLET BY MOUTH EVERY DAY 30 tablet 1  . ferrous sulfate 325 (65 FE) MG tablet TAKE 1 TABLET (325 MG TOTAL) BY MOUTH 2 (TWO) TIMES DAILY WITH A MEAL. 180 tablet 1  . gabapentin (NEURONTIN) 100 MG capsule Take 2 capsules (200 mg total) by mouth 2 (two) times daily. 90 capsule 1  . gabapentin (NEURONTIN) 300 MG  capsule Take 1 capsule (300 mg total) by mouth at bedtime. 30 capsule 1  . hydrALAZINE (APRESOLINE) 25 MG tablet TAKE 1 TABLET (25 MG TOTAL) BY MOUTH EVERY 8 (EIGHT) HOURS. 90 tablet 1  . hydrochlorothiazide (HYDRODIURIL) 12.5 MG tablet TAKE 1 TABLET BY MOUTH EVERY DAY 30 tablet 1  . hydrocortisone (ANUSOL-HC) 2.5 % rectal cream Place 1 application rectally 2 (two) times daily. 30 g 0  . Ibuprofen (ADVIL) 200 MG CAPS Take 400 mg by mouth daily as needed (pain).    . isosorbide mononitrate (IMDUR) 30 MG 24 hr tablet Take 1 tablet (30 mg total) by mouth daily. 90 tablet 0  . lidocaine (LMX) 4 % cream Apply 1 application topically 3 (three) times daily as needed. 30 g 0  . lidocaine-prilocaine (EMLA) cream Apply to affected area once (Patient taking differently: Apply 1 application topically once. Apply to affected area once) 30 g 3  . lisinopril (ZESTRIL) 40 MG tablet TAKE 1 TABLET BY MOUTH EVERY DAY 90 tablet 1  . loperamide (IMODIUM) 2 MG capsule TAKE 1 CAPSULE (2 MG TOTAL) BY MOUTH AS NEEDED FOR DIARRHEA OR LOOSE STOOLS. 90 capsule 0  . metoprolol tartrate (LOPRESSOR) 50 MG tablet TAKE 1 TABLET BY MOUTH TWICE A DAY 180 tablet 1  . ondansetron (ZOFRAN) 4 MG tablet Take 1 tablet (4 mg total) by mouth every 6 (six) hours as needed for nausea. [place]  tablet 0  . ondansetron (ZOFRAN) 8 MG tablet Take 1 tablet (8 mg total) by mouth 2 (two) times daily as needed for refractory nausea / vomiting. Start on day 3 after chemotherapy. 30 tablet 1  . oxyCODONE-acetaminophen (PERCOCET/ROXICET) 5-325 MG tablet Take 1-2 tablets by mouth every 6 (six) hours as needed for severe pain. 20 tablet 0  . polycarbophil (FIBERCON) 625 MG tablet Take 1 tablet (625 mg total) by mouth daily. 30 tablet 0  . prochlorperazine (COMPAZINE) 10 MG tablet Take 1 tablet (10 mg total) by mouth every 6 (six) hours as needed (Nausea or vomiting). 30 tablet 1  . metroNIDAZOLE (FLAGYL) 250 MG tablet Take 1 tablet (250 mg total) by mouth 3  (three) times daily. 1 tablet 0   No current facility-administered medications for this visit.   Facility-Administered Medications Ordered in Other Visits  Medication Dose Route Frequency Provider Last Rate Last Admin  . 0.9 %  sodium chloride infusion   Intravenous Continuous Casey Feeling, NP 20 mL/hr at 11/26/19 1252 New Bag at 11/26/19 1252  . fluorouracil (ADRUCIL) 5,000 mg in sodium chloride 0.9 % 150 mL chemo infusion  2,470 mg/m2 (Treatment Plan Recorded) Intravenous 1 day or 1 dose Casey Merle, MD      . leucovorin 808 mg in dextrose 5 % 250 mL infusion  400 mg/m2 (Treatment Plan Recorded) Intravenous Once Casey Merle, MD      . palonosetron (ALOXI) injection 0.25 mg  0.25 mg Intravenous Once Casey Merle, MD        PHYSICAL EXAMINATION: ECOG PERFORMANCE STATUS: 1 - Symptomatic but completely ambulatory  Vitals:   11/26/19 1151 11/26/19 1155  BP: (!) 196/66 (!) 169/69  Pulse: 60   Resp: 18   Temp: (!) 97.3 F (36.3 C)   SpO2: 100%    Filed Weights   11/26/19 1151  Weight: 204 lb (92.5 kg)    GENERAL:alert, no distress and comfortable SKIN: No rash to exposed skin.  Palms with hyperpigmentation. EYES:  sclera clear OROPHARYNX: Gingival hyperpigmentation without erythema or ulcers.  Retained tooth fragment to the right upper maxilla LUNGS:  normal breathing effort HEART: Trace left ankle and pedal edema.  No calf tenderness or palpable cord ABDOMEN: Colostomy to right abdomen NEURO: alert & oriented x 3 with fluent speech.  Decreased vibratory sense over the fingertips per tuning fork exam. PAC without erythema  LABORATORY DATA:  I have reviewed the data as listed CBC Latest Ref Rng & Units 11/26/2019 11/22/2019 11/12/2019  WBC 4.0 - 10.5 K/uL 5.2 4.2 5.7  Hemoglobin 12.0 - 15.0 g/dL 11.1(L) 10.8(L) 10.5(L)  Hematocrit 36 - 46 % 35.0(L) 34.1(L) 33.3(L)  Platelets 150 - 400 K/uL 147(L) 172 120(L)     CMP Latest Ref Rng & Units 11/26/2019 11/22/2019 11/12/2019  Glucose 70 -  99 mg/dL 78 86 75  BUN 6 - 20 mg/dL 25(H) 27(H) 23(H)  Creatinine 0.44 - 1.00 mg/dL 0.99 0.90 1.04(H)  Sodium 135 - 145 mmol/L 140 137 137  Potassium 3.5 - 5.1 mmol/L 5.2(H) 4.8 4.6  Chloride 98 - 111 mmol/L 113(H) 111 111  CO2 22 - 32 mmol/L 22 20(L) 20(L)  Calcium 8.9 - 10.3 mg/dL 10.1 10.4(H) 10.2  Total Protein 6.5 - 8.1 g/dL 7.4 7.6 7.6  Total Bilirubin 0.3 - 1.2 mg/dL 0.3 0.3 0.4  Alkaline Phos 38 - 126 U/L 135(H) 139(H) 120  AST 15 - 41 U/L 26 32 30  ALT 0 - 44 U/L 22 31 23  RADIOGRAPHIC STUDIES: I have personally reviewed the radiological images as listed and agreed with the findings in the report. No results found.   ASSESSMENT & PLAN: Casey Black a 58y.o.femalewith   1. Adenocarcinoma of transverse colon, moderately differentiated, pT4aN1aM1a stage IV with liverand nodalmetastasis, MMR normal  -Diagnosed in 01/2019 afteremergent colectomy and liver biopsy. 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  -PET from 02/26/19 showsknown liver metastasis and metastatic lymphadenopathy in chest and right axilla.  -[name] shows MSI stable disease, Kras+ No targetable mutation, she is not a candidate for EGFR or immunotherapy. -Began palliative first line chemoon 03/12/2019, received 5FU/leuc with first 2 cycles forlarge open abdominal wound after surgery. She started full dose FOLFOX and avastin with cycle 2, s/p15cycles of treatment -Interim scans have been stable overall  -She developed CIPN after cycle 12, oxali dose reduced to 70 mg/m2 with cycle 13. Symptoms progressed and further reduced to 40 mg/m2 from cycle 14and 15 -She has progressive neuropathy but otherwise tolerating treatment well. No new symptoms or side effects. Oxaliplatin was held from cycle 16 -continues maintenance 5FU/leuc and avastin   2. Chemotherapy induced peripheral neuropathy (CIPN), G2 -secondary to Oxaliplatin, developed after cycle 12  FOLFOX -initially began in her fingertips, cycle 13 Oxali dose reduced to 70 mg/m2 -she has progressive neuropathy in fingertips and feet since cycle 14. She has moderate sensory deficit in her feet but still able to function and ambulate well. No pain or sensitivity  -Oxaliplatin furtherdose reducedto 40 mg/m2 cycle 14. -persistent for cycle 15, dose continued 40 mg/m2 and initiated gabapentin  -progressive, Oxaliplatin held from cycle 16 -Titrating gabapentin (up to 100 mg AM and 300 mg qHS starting 10/30/19), stable  3.Nutrition/Anorexia  -she reportedly weighed 253 lbs a few years ago, and weighed 203 lbs at symptom onset~11/2018 -190 lbsin 02/2019.  -Continue to f/u withdietician -she has been able to gain weight on chemo; continue monitoring  4. Iron deficiency anemia, and anemia secondary to cancer -She had low ferritinand low TIBC -IDA responding to ferrous sulfate BID.  She can reduce to oral iron once daily for now -iron studies remain normal, continue monitoring  5. HTN -Initiallyon hydralazine, isosorbide, lisinopril, metoprolol. -Avastin and anxiety contributing to BP elevation in clinic. Received clonidine PRN on chemo days.  -Recently added amlodipine and HCTZ with improvement. Tolerating well without sx of hypotension  6. Social support -She worked in [employer] at 2 different jobs, but has been out of work lately due to her symptoms  -She is not getting disability, no pay, but her employer kept her on their insurance plan -has been referred to SW to discuss financial option/resources  7. Goals of care discussion -Goal is palliative, to control disease and prolong her life. -wehavediscussedher cancer is not curable at this stage, and that we will continue treatment as long as she can tolerate and her disease is controlled.she understands her treatment plan will evolve depending on her response and side effects of treatment.  -Full  code  Disposition: Casey Black appears stable.  She completed another cycle of maintenance 5-FU/leucovorin and Avastin.  She tolerates treatment well with stable neuropathy, otherwise no significant toxicities.   She developed left ankle and pedal edema.  No erythema or tenderness.  I have a low suspicion for DVT, this could be positional.  However given her metastatic cancer on chemo and bevacizumab will obtain Doppler to rule out DVT.  CBC is stable, CMP shows normalized calcium.  She has developed mild hyperkalemia.  She is not on potassium supplement.  I encouraged her to avoid high potassium intake, continue MiraLAX to maintain normal BM, and continue oral hydration.  Her CEA was decreased 2 weeks ago.  She will proceed with another cycle of 5-FU/leucovorin today.  We are holding bevacizumab due to upcoming oral surgery on 12/28/2019.  She will return on 8/24 for restaging CT.  I will try to obtain Doppler that day as well.  We will call her with results.  She will return for 5FU/leuc in 2 weeks, f/u and treatment in 6 weeks, she will have a short chemo break for oral surgery. The plan was reviewed with Dr. Burr Medico.  All questions were answered. The patient knows to call the clinic with any problems, questions or concerns. No barriers to learning were detected.     Casey Feeling, NP 11/26/19

## 2019-11-26 ENCOUNTER — Inpatient Hospital Stay: Payer: BC Managed Care – PPO

## 2019-11-26 ENCOUNTER — Inpatient Hospital Stay (HOSPITAL_BASED_OUTPATIENT_CLINIC_OR_DEPARTMENT_OTHER): Payer: BC Managed Care – PPO | Admitting: Nurse Practitioner

## 2019-11-26 ENCOUNTER — Other Ambulatory Visit: Payer: Self-pay

## 2019-11-26 ENCOUNTER — Encounter: Payer: Self-pay | Admitting: Nurse Practitioner

## 2019-11-26 VITALS — BP 169/69 | HR 60 | Temp 97.3°F | Resp 18 | Ht 67.0 in | Wt 204.0 lb

## 2019-11-26 DIAGNOSIS — G620 Drug-induced polyneuropathy: Secondary | ICD-10-CM | POA: Diagnosis not present

## 2019-11-26 DIAGNOSIS — Z5111 Encounter for antineoplastic chemotherapy: Secondary | ICD-10-CM | POA: Diagnosis not present

## 2019-11-26 DIAGNOSIS — C787 Secondary malignant neoplasm of liver and intrahepatic bile duct: Secondary | ICD-10-CM

## 2019-11-26 DIAGNOSIS — C189 Malignant neoplasm of colon, unspecified: Secondary | ICD-10-CM

## 2019-11-26 DIAGNOSIS — C773 Secondary and unspecified malignant neoplasm of axilla and upper limb lymph nodes: Secondary | ICD-10-CM | POA: Diagnosis not present

## 2019-11-26 DIAGNOSIS — Z5112 Encounter for antineoplastic immunotherapy: Secondary | ICD-10-CM | POA: Diagnosis not present

## 2019-11-26 DIAGNOSIS — Z95828 Presence of other vascular implants and grafts: Secondary | ICD-10-CM

## 2019-11-26 DIAGNOSIS — C184 Malignant neoplasm of transverse colon: Secondary | ICD-10-CM | POA: Diagnosis not present

## 2019-11-26 DIAGNOSIS — Z7189 Other specified counseling: Secondary | ICD-10-CM

## 2019-11-26 LAB — CMP (CANCER CENTER ONLY)
ALT: 22 U/L (ref 0–44)
AST: 26 U/L (ref 15–41)
Albumin: 3.5 g/dL (ref 3.5–5.0)
Alkaline Phosphatase: 135 U/L — ABNORMAL HIGH (ref 38–126)
Anion gap: 5 (ref 5–15)
BUN: 25 mg/dL — ABNORMAL HIGH (ref 6–20)
CO2: 22 mmol/L (ref 22–32)
Calcium: 10.1 mg/dL (ref 8.9–10.3)
Chloride: 113 mmol/L — ABNORMAL HIGH (ref 98–111)
Creatinine: 0.99 mg/dL (ref 0.44–1.00)
GFR, Est AFR Am: >60 mL/min (ref >60)
GFR, Estimated: >60 mL/min (ref >60)
Glucose, Bld: 78 mg/dL (ref 70–99)
Potassium: 5.2 mmol/L — ABNORMAL HIGH (ref 3.5–5.1)
Sodium: 140 mmol/L (ref 135–145)
Total Bilirubin: 0.3 mg/dL (ref 0.3–1.2)
Total Protein: 7.4 g/dL (ref 6.5–8.1)

## 2019-11-26 LAB — CBC WITH DIFFERENTIAL (CANCER CENTER ONLY)
Abs Immature Granulocytes: 0.01 10*3/uL (ref 0.00–0.07)
Basophils Absolute: 0 10*3/uL (ref 0.0–0.1)
Basophils Relative: 1 %
Eosinophils Absolute: 0.2 10*3/uL (ref 0.0–0.5)
Eosinophils Relative: 4 %
HCT: 35 % — ABNORMAL LOW (ref 36.0–46.0)
Hemoglobin: 11.1 g/dL — ABNORMAL LOW (ref 12.0–15.0)
Immature Granulocytes: 0 %
Lymphocytes Relative: 35 %
Lymphs Abs: 1.8 10*3/uL (ref 0.7–4.0)
MCH: 30.8 pg (ref 26.0–34.0)
MCHC: 31.7 g/dL (ref 30.0–36.0)
MCV: 97.2 fL (ref 80.0–100.0)
Monocytes Absolute: 0.6 10*3/uL (ref 0.1–1.0)
Monocytes Relative: 11 %
Neutro Abs: 2.6 10*3/uL (ref 1.7–7.7)
Neutrophils Relative %: 49 %
Platelet Count: 147 10*3/uL — ABNORMAL LOW (ref 150–400)
RBC: 3.6 MIL/uL — ABNORMAL LOW (ref 3.87–5.11)
RDW: 15.3 % (ref 11.5–15.5)
WBC Count: 5.2 10*3/uL (ref 4.0–10.5)
nRBC: 0 % (ref 0.0–0.2)

## 2019-11-26 MED ORDER — AMLODIPINE BESYLATE 10 MG PO TABS: 10.0000 mg | ORAL_TABLET | Freq: Every day | ORAL | 1 refills | Status: DC

## 2019-11-26 MED ORDER — SODIUM CHLORIDE 0.9 % IV SOLN
2470.0000 mg/m2 | INTRAVENOUS | Status: DC
  Administered 2019-11-26: 5000 mg via INTRAVENOUS
  Filled 2019-11-26: qty 100

## 2019-11-26 MED ORDER — SODIUM CHLORIDE 0.9% FLUSH
10.0000 mL | INTRAVENOUS | Status: DC | PRN
  Administered 2019-11-26: 10 mL
  Filled 2019-11-26: qty 10

## 2019-11-26 MED ORDER — PALONOSETRON HCL INJECTION 0.25 MG/5ML
INTRAVENOUS | Status: AC
  Filled 2019-11-26: qty 5

## 2019-11-26 MED ORDER — PALONOSETRON HCL INJECTION 0.25 MG/5ML
0.2500 mg | Freq: Once | INTRAVENOUS | Status: AC
  Administered 2019-11-26: 0.25 mg via INTRAVENOUS

## 2019-11-26 MED ORDER — LEUCOVORIN CALCIUM INJECTION 350 MG
400.0000 mg/m2 | Freq: Once | INTRAVENOUS | Status: AC
  Administered 2019-11-26: 808 mg via INTRAVENOUS
  Filled 2019-11-26: qty 40.4

## 2019-11-26 MED ORDER — SODIUM CHLORIDE 0.9 % IV SOLN
INTRAVENOUS | Status: DC
  Filled 2019-11-26: qty 250

## 2019-11-26 NOTE — Patient Instructions (Signed)
[facility] Discharge Instructions for Patients Receiving Chemotherapy  Today you received the following chemotherapy agents: Leucovorin and Adrucil   To help prevent nausea and vomiting after your treatment, we encourage you to take your nausea medication as directed.    If you develop nausea and vomiting that is not controlled by your nausea medication, call the clinic.   BELOW ARE SYMPTOMS THAT SHOULD BE REPORTED IMMEDIATELY:  *FEVER GREATER THAN 100.5 F  *CHILLS WITH OR WITHOUT FEVER  NAUSEA AND VOMITING THAT IS NOT CONTROLLED WITH YOUR NAUSEA MEDICATION  *UNUSUAL SHORTNESS OF BREATH  *UNUSUAL BRUISING OR BLEEDING  TENDERNESS IN MOUTH AND THROAT WITH OR WITHOUT PRESENCE OF ULCERS  *URINARY PROBLEMS  *BOWEL PROBLEMS  UNUSUAL RASH Items with * indicate a potential emergency and should be followed up as soon as possible.  Feel free to call the clinic should you have any questions or concerns. The clinic phone number is [phone].  Please show the CHEMO ALERT CARD at check-in to the Emergency Department and triage nurse.   

## 2019-11-26 NOTE — Progress Notes (Signed)
Per Cira Rue, NP will keep orders for Aloxi.  Pt denies N/V and has stayed well controlled.  Kennith Center, Pharm.D., CPP 11/26/2019@1 :04 PM

## 2019-11-27 ENCOUNTER — Telehealth: Payer: Self-pay | Admitting: Nurse Practitioner

## 2019-11-27 ENCOUNTER — Ambulatory Visit (HOSPITAL_BASED_OUTPATIENT_CLINIC_OR_DEPARTMENT_OTHER)
Admission: RE | Admit: 2019-11-27 | Discharge: 2019-11-27 | Disposition: A | Discharge status: Home / Self Care | Payer: BC Managed Care – PPO | Source: Ambulatory Visit | Attending: Nurse Practitioner | Admitting: Nurse Practitioner

## 2019-11-27 ENCOUNTER — Other Ambulatory Visit: Payer: Self-pay

## 2019-11-27 ENCOUNTER — Ambulatory Visit (HOSPITAL_COMMUNITY)
Admission: RE | Admit: 2019-11-27 | Discharge: 2019-11-27 | Disposition: A | Discharge status: Home / Self Care | Payer: BC Managed Care – PPO | Source: Ambulatory Visit | Attending: Hematology | Admitting: Hematology

## 2019-11-27 DIAGNOSIS — C787 Secondary malignant neoplasm of liver and intrahepatic bile duct: Secondary | ICD-10-CM | POA: Diagnosis not present

## 2019-11-27 DIAGNOSIS — C189 Malignant neoplasm of colon, unspecified: Secondary | ICD-10-CM

## 2019-11-27 DIAGNOSIS — I10 Essential (primary) hypertension: Secondary | ICD-10-CM

## 2019-11-27 MED ORDER — IOHEXOL 300 MG/ML  SOLN
100.0000 mL | Freq: Once | INTRAMUSCULAR | Status: AC | PRN
  Administered 2019-11-27: 100 mL via INTRAVENOUS

## 2019-11-27 MED ORDER — AMLODIPINE BESYLATE 10 MG PO TABS: 10.0000 mg | ORAL_TABLET | Freq: Every day | ORAL | 1 refills | Status: DC

## 2019-11-27 MED ORDER — SODIUM CHLORIDE (PF) 0.9 % IJ SOLN
INTRAMUSCULAR | Status: AC
  Filled 2019-11-27: qty 50

## 2019-11-27 NOTE — Progress Notes (Signed)
Lower extremity venous bilateral study completed  Preliminary results relayed to [facility].   See CV Proc for preliminary results report.   Casey Black

## 2019-11-27 NOTE — Telephone Encounter (Signed)
Scheduled per 8/23 los. Unable to contact pt. Left voicemail with appt time and date.

## 2019-11-27 NOTE — Progress Notes (Signed)
We received notification from patients pharmacy that insurance will only pay for 90 day supply.

## 2019-11-27 NOTE — Telephone Encounter (Signed)
Scheduled per 8/23 los. Pt is aware of appt time and date. 

## 2019-11-28 ENCOUNTER — Inpatient Hospital Stay: Payer: BC Managed Care – PPO

## 2019-11-28 ENCOUNTER — Other Ambulatory Visit: Payer: Self-pay

## 2019-11-28 ENCOUNTER — Telehealth: Payer: Self-pay

## 2019-11-28 VITALS — BP 154/68 | HR 63 | Temp 99.4°F | Resp 18

## 2019-11-28 DIAGNOSIS — C787 Secondary malignant neoplasm of liver and intrahepatic bile duct: Secondary | ICD-10-CM | POA: Diagnosis not present

## 2019-11-28 DIAGNOSIS — Z5111 Encounter for antineoplastic chemotherapy: Secondary | ICD-10-CM | POA: Diagnosis not present

## 2019-11-28 DIAGNOSIS — C189 Malignant neoplasm of colon, unspecified: Secondary | ICD-10-CM

## 2019-11-28 DIAGNOSIS — Z5112 Encounter for antineoplastic immunotherapy: Secondary | ICD-10-CM | POA: Diagnosis not present

## 2019-11-28 DIAGNOSIS — C773 Secondary and unspecified malignant neoplasm of axilla and upper limb lymph nodes: Secondary | ICD-10-CM | POA: Diagnosis not present

## 2019-11-28 DIAGNOSIS — G620 Drug-induced polyneuropathy: Secondary | ICD-10-CM | POA: Diagnosis not present

## 2019-11-28 DIAGNOSIS — C184 Malignant neoplasm of transverse colon: Secondary | ICD-10-CM | POA: Diagnosis not present

## 2019-11-28 DIAGNOSIS — Z7189 Other specified counseling: Secondary | ICD-10-CM

## 2019-11-28 DIAGNOSIS — Z95828 Presence of other vascular implants and grafts: Secondary | ICD-10-CM

## 2019-11-28 MED ORDER — SODIUM CHLORIDE 0.9% FLUSH
10.0000 mL | INTRAVENOUS | Status: DC | PRN
  Administered 2019-11-28: 10 mL
  Filled 2019-11-28: qty 10

## 2019-11-28 MED ORDER — HEPARIN SOD (PORK) LOCK FLUSH 100 UNIT/ML IV SOLN
500.0000 [IU] | Freq: Once | INTRAVENOUS | Status: AC | PRN
  Administered 2019-11-28: 500 [IU]
  Filled 2019-11-28: qty 5

## 2019-11-28 NOTE — Telephone Encounter (Signed)
Spoke with pt relayed results   Lower extremity venous bilateral study completed Preliminary results relayed to [place].  See CV Proc for preliminary results report.  Casey Black  Also made pt aware once ct scan is read follow up call will be made

## 2019-12-11 ENCOUNTER — Inpatient Hospital Stay: Payer: BC Managed Care – PPO | Attending: Nurse Practitioner

## 2019-12-11 ENCOUNTER — Inpatient Hospital Stay: Payer: BC Managed Care – PPO

## 2019-12-11 ENCOUNTER — Other Ambulatory Visit: Payer: Self-pay

## 2019-12-11 VITALS — BP 147/53 | HR 69 | Temp 98.2°F | Resp 20 | Wt 209.5 lb

## 2019-12-11 DIAGNOSIS — C787 Secondary malignant neoplasm of liver and intrahepatic bile duct: Secondary | ICD-10-CM

## 2019-12-11 DIAGNOSIS — C778 Secondary and unspecified malignant neoplasm of lymph nodes of multiple regions: Secondary | ICD-10-CM | POA: Insufficient documentation

## 2019-12-11 DIAGNOSIS — Z5111 Encounter for antineoplastic chemotherapy: Secondary | ICD-10-CM | POA: Diagnosis not present

## 2019-12-11 DIAGNOSIS — C184 Malignant neoplasm of transverse colon: Secondary | ICD-10-CM | POA: Insufficient documentation

## 2019-12-11 DIAGNOSIS — Z7189 Other specified counseling: Secondary | ICD-10-CM

## 2019-12-11 DIAGNOSIS — Z95828 Presence of other vascular implants and grafts: Secondary | ICD-10-CM

## 2019-12-11 DIAGNOSIS — C189 Malignant neoplasm of colon, unspecified: Secondary | ICD-10-CM

## 2019-12-11 LAB — CMP (CANCER CENTER ONLY)
ALT: 28 U/L (ref 0–44)
AST: 31 U/L (ref 15–41)
Albumin: 3.6 g/dL (ref 3.5–5.0)
Alkaline Phosphatase: 122 U/L (ref 38–126)
Anion gap: 9 (ref 5–15)
BUN: 27 mg/dL — ABNORMAL HIGH (ref 6–20)
CO2: 18 mmol/L — ABNORMAL LOW (ref 22–32)
Calcium: 9.8 mg/dL (ref 8.9–10.3)
Chloride: 110 mmol/L (ref 98–111)
Creatinine: 0.93 mg/dL (ref 0.44–1.00)
GFR, Est AFR Am: >60 mL/min (ref >60)
GFR, Estimated: >60 mL/min (ref >60)
Glucose, Bld: 76 mg/dL (ref 70–99)
Potassium: 5.1 mmol/L (ref 3.5–5.1)
Sodium: 137 mmol/L (ref 135–145)
Total Bilirubin: 0.3 mg/dL (ref 0.3–1.2)
Total Protein: 7.8 g/dL (ref 6.5–8.1)

## 2019-12-11 LAB — IRON AND TIBC
Iron: 131 ug/dL (ref 41–142)
Saturation Ratios: 45 % (ref 21–57)
TIBC: 289 ug/dL (ref 236–444)
UIBC: 158 ug/dL (ref 120–384)

## 2019-12-11 LAB — CBC WITH DIFFERENTIAL (CANCER CENTER ONLY)
Abs Immature Granulocytes: 0.01 10*3/uL (ref 0.00–0.07)
Basophils Absolute: 0 10*3/uL (ref 0.0–0.1)
Basophils Relative: 1 %
Eosinophils Absolute: 0.2 10*3/uL (ref 0.0–0.5)
Eosinophils Relative: 4 %
HCT: 36.1 % (ref 36.0–46.0)
Hemoglobin: 11.4 g/dL — ABNORMAL LOW (ref 12.0–15.0)
Immature Granulocytes: 0 %
Lymphocytes Relative: 29 %
Lymphs Abs: 1.9 10*3/uL (ref 0.7–4.0)
MCH: 30.4 pg (ref 26.0–34.0)
MCHC: 31.6 g/dL (ref 30.0–36.0)
MCV: 96.3 fL (ref 80.0–100.0)
Monocytes Absolute: 0.6 10*3/uL (ref 0.1–1.0)
Monocytes Relative: 10 %
Neutro Abs: 3.6 10*3/uL (ref 1.7–7.7)
Neutrophils Relative %: 56 %
Platelet Count: 136 10*3/uL — ABNORMAL LOW (ref 150–400)
RBC: 3.75 MIL/uL — ABNORMAL LOW (ref 3.87–5.11)
RDW: 15 % (ref 11.5–15.5)
WBC Count: 6.4 10*3/uL (ref 4.0–10.5)
nRBC: 0 % (ref 0.0–0.2)

## 2019-12-11 LAB — FERRITIN: Ferritin: 142 ng/mL (ref 11–307)

## 2019-12-11 LAB — TOTAL PROTEIN, URINE DIPSTICK: Protein, ur: NEGATIVE mg/dL

## 2019-12-11 MED ORDER — LEUCOVORIN CALCIUM INJECTION 350 MG
400.0000 mg/m2 | Freq: Once | INTRAVENOUS | Status: AC
  Administered 2019-12-11: 808 mg via INTRAVENOUS
  Filled 2019-12-11: qty 40.4

## 2019-12-11 MED ORDER — SODIUM CHLORIDE 0.9% FLUSH
10.0000 mL | INTRAVENOUS | Status: DC | PRN
  Filled 2019-12-11: qty 10

## 2019-12-11 MED ORDER — SODIUM CHLORIDE 0.9 % IV SOLN
2470.0000 mg/m2 | INTRAVENOUS | Status: DC
  Administered 2019-12-11: 5000 mg via INTRAVENOUS
  Filled 2019-12-11: qty 100

## 2019-12-11 MED ORDER — HEPARIN SOD (PORK) LOCK FLUSH 100 UNIT/ML IV SOLN
500.0000 [IU] | Freq: Once | INTRAVENOUS | Status: DC | PRN
  Filled 2019-12-11: qty 5

## 2019-12-11 MED ORDER — PALONOSETRON HCL INJECTION 0.25 MG/5ML
0.2500 mg | Freq: Once | INTRAVENOUS | Status: AC
  Administered 2019-12-11: 0.25 mg via INTRAVENOUS

## 2019-12-11 MED ORDER — SODIUM CHLORIDE 0.9% FLUSH
10.0000 mL | INTRAVENOUS | Status: DC | PRN
  Administered 2019-12-11: 10 mL
  Filled 2019-12-11: qty 10

## 2019-12-11 MED ORDER — PALONOSETRON HCL INJECTION 0.25 MG/5ML
INTRAVENOUS | Status: AC
  Filled 2019-12-11: qty 5

## 2019-12-11 MED ORDER — DEXTROSE 5 % IV SOLN
Freq: Once | INTRAVENOUS | Status: AC
  Filled 2019-12-11: qty 250

## 2019-12-11 NOTE — Patient Instructions (Signed)
[facility] Discharge Instructions for Patients Receiving Chemotherapy  Today you received the following chemotherapy agents: Leucovorin, 5FU  To help prevent nausea and vomiting after your treatment, we encourage you to take your nausea medication as directed.   If you develop nausea and vomiting that is not controlled by your nausea medication, call the clinic.   BELOW ARE SYMPTOMS THAT SHOULD BE REPORTED IMMEDIATELY:  *FEVER GREATER THAN 100.5 F  *CHILLS WITH OR WITHOUT FEVER  NAUSEA AND VOMITING THAT IS NOT CONTROLLED WITH YOUR NAUSEA MEDICATION  *UNUSUAL SHORTNESS OF BREATH  *UNUSUAL BRUISING OR BLEEDING  TENDERNESS IN MOUTH AND THROAT WITH OR WITHOUT PRESENCE OF ULCERS  *URINARY PROBLEMS  *BOWEL PROBLEMS  UNUSUAL RASH Items with * indicate a potential emergency and should be followed up as soon as possible.  Feel free to call the clinic should you have any questions or concerns. The clinic phone number is [phone].  Please show the CHEMO ALERT CARD at check-in to the Emergency Department and triage nurse.   

## 2019-12-13 ENCOUNTER — Telehealth: Payer: Self-pay | Admitting: Nurse Practitioner

## 2019-12-13 ENCOUNTER — Inpatient Hospital Stay: Payer: BC Managed Care – PPO

## 2019-12-13 ENCOUNTER — Other Ambulatory Visit: Payer: Self-pay

## 2019-12-13 VITALS — BP 186/69 | HR 58 | Temp 99.0°F | Resp 18

## 2019-12-13 DIAGNOSIS — C184 Malignant neoplasm of transverse colon: Secondary | ICD-10-CM | POA: Diagnosis not present

## 2019-12-13 DIAGNOSIS — Z5111 Encounter for antineoplastic chemotherapy: Secondary | ICD-10-CM | POA: Diagnosis not present

## 2019-12-13 DIAGNOSIS — C189 Malignant neoplasm of colon, unspecified: Secondary | ICD-10-CM

## 2019-12-13 DIAGNOSIS — C787 Secondary malignant neoplasm of liver and intrahepatic bile duct: Secondary | ICD-10-CM | POA: Diagnosis not present

## 2019-12-13 DIAGNOSIS — Z7189 Other specified counseling: Secondary | ICD-10-CM

## 2019-12-13 DIAGNOSIS — C778 Secondary and unspecified malignant neoplasm of lymph nodes of multiple regions: Secondary | ICD-10-CM | POA: Diagnosis not present

## 2019-12-13 MED ORDER — HEPARIN SOD (PORK) LOCK FLUSH 100 UNIT/ML IV SOLN
500.0000 [IU] | Freq: Once | INTRAVENOUS | Status: AC | PRN
  Administered 2019-12-13: 500 [IU]
  Filled 2019-12-13: qty 5

## 2019-12-13 MED ORDER — SODIUM CHLORIDE 0.9% FLUSH
10.0000 mL | INTRAVENOUS | Status: DC | PRN
  Administered 2019-12-13: 10 mL
  Filled 2019-12-13: qty 10

## 2019-12-13 NOTE — Progress Notes (Signed)
Patient stated she had not taken her BP meds for today and that she was headed home to take her meds and she will recheck BP in about an hour or so.Marland Kitchen

## 2019-12-13 NOTE — Telephone Encounter (Signed)
I personally reviewed her restaging CT and discussed with Dr. Burr Medico, I called Ms. Glendenning to let her know scan is overall stable. We are recommending to continue the current regimen. She is scheduled to have oral surgery on 9/26. She will return for f/u and to resume treatment on 10/4. She agrees to call us to restart treatment sooner if she decides not to proceed with oral surgery. She appreciates the call.   Cira Rue, NP

## 2019-12-16 ENCOUNTER — Other Ambulatory Visit: Payer: Self-pay | Admitting: Nurse Practitioner

## 2019-12-16 ENCOUNTER — Other Ambulatory Visit: Payer: Self-pay | Admitting: Internal Medicine

## 2019-12-19 ENCOUNTER — Other Ambulatory Visit: Payer: Self-pay | Admitting: Internal Medicine

## 2019-12-20 DIAGNOSIS — K56609 Unspecified intestinal obstruction, unspecified as to partial versus complete obstruction: Secondary | ICD-10-CM | POA: Diagnosis not present

## 2019-12-20 DIAGNOSIS — Z933 Colostomy status: Secondary | ICD-10-CM | POA: Diagnosis not present

## 2019-12-26 ENCOUNTER — Telehealth: Payer: Self-pay | Admitting: Hematology

## 2019-12-26 NOTE — Telephone Encounter (Signed)
Release: [id number] Faxed medical records to [place] per Tiffany Parker's request due to needing the pt's most recent medication list in order to perform surgery on her today. Faxed to [phone]

## 2019-12-27 ENCOUNTER — Other Ambulatory Visit: Payer: Self-pay | Admitting: Nurse Practitioner

## 2020-01-04 NOTE — Progress Notes (Signed)
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 Bowel-gas pattern consistent with small bowel obstruction. No free air. 2. No acute chest findings.   01/23/2019 Imaging   CT AP IMPRESSION: Obstructing mid transverse colonic mass with mild regional adenopathy and hepatic metastatic disease. The mass likely extends through the serosa; no ascites or peritoneal nodularity.   01/24/2019 Surgery   Surgeon: Clovis Riley MD Assistant: [name] Procedure performed: Transverse colectomy with end colostomy, liver biopsy Procedure classification: URGENT/EMERGENT Preop diagnosis: Obstructing, metastatic transverse colon mass Post-op diagnosis/intraop findings: Same   01/24/2019 Pathology Results   FINAL MICROSCOPIC DIAGNOSIS:   A. COLON, TRANSVERSE, RESECTION:  Colonic adenocarcinoma, 5 cm.  Carcinoma extends into pericolonic connective tissue and focally to  serosal surface.  Margins not involved.  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   B. LIVER NODULE, LEFT, BIOPSY:  Metastatic adenocarcinoma.    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 Hypermetabolic metastatic disease in the liver and mediastinal/hilar/axillary lymph nodes. 2. Focal hypermetabolism in the rectum. Continued attention on follow-up exams is warranted. 3. Focal hypermetabolism medial to the right adrenal gland may be within a metastatic lymph node, better visualized on 01/23/2019. 4. Aortic atherosclerosis (ICD10-170.0). Coronary artery calcification.   03/12/2019 -  Chemotherapy   She started 5FU q2weeks on 03/12/19 for 2 cycles. She started full dose FOLFOX with Avastin on 04/09/19. Oxaliplatin dose reduced repeatedly due to neuropathy C12 and held since C16 on 10/09/19. Now on maintenance Avastin and 5FU q2weeks since 10/09/19   05/31/2019 Imaging   Restaging CT CAP IMPRESSION: 1. Similar to mild interval decrease in size of multiple hepatic lesions, partially calcified. 2. 2 mm right upper lobe pulmonary nodule. Recommend attention on follow-up. 3. Emphysema and aortic atherosclerosis.   08/23/2019 Imaging   CT CAP w contrast  IMPRESSION: 1. The dominant peripheral right liver metastasis has mildly increased. Other smaller liver metastases are stable. 2. Otherwise no new or progressive metastatic disease in the chest, abdomen or pelvis. 3. Aortic Atherosclerosis (ICD10-I70.0) and Emphysema (ICD10-J43.9).   11/27/2019 Imaging   CT CAP w contrast  IMPRESSION: Status post transverse colectomy with right mid abdominal colostomy.   Mildly progressive hepatic metastases, as above.   No evidence of metastatic disease in the chest. Small mediastinal lymph nodes are within normal limits.   Additional stable ancillary findings as above.      CURRENT THERAPY:  Maintenance Avastin and 5FU q2weeks since  10/09/19  INTERVAL HISTORY:  Casey Black is here for a follow up and treatment. She presents to the clinic alone. She notes she had dental surgery on 01/02/20 to remove part of a broken tooth. She only had mild bleeding from this. She is fine with switching to Xeloda. She still has neuropathy in her fingers and her feet. She notes some days she has issues with function in fingers but overall manageable. She notes she still has rectal pain but does not require pain medication.    REVIEW OF SYSTEMS:   Constitutional: Denies fevers, chills or abnormal weight loss Eyes: Denies blurriness of vision Ears, nose, mouth, throat, and face: Denies mucositis or sore throat Respiratory: Denies cough, dyspnea or wheezes Cardiovascular: Denies palpitation, chest discomfort or lower extremity swelling Gastrointestinal:  Denies nausea, heartburn or change in bowel habits (+) Rectal pain manageable  Skin: Denies abnormal skin rashes Lymphatics: Denies new lymphadenopathy or easy bruising Neurological: (+) Neuropathy in fingers and feet, manageable.   Behavioral/Psych: Mood is stable, no new changes  All other systems were reviewed with the patient and are negative.  MEDICAL HISTORY:  Past Medical History:  Diagnosis Date  . Anemia    low iron  . Cancer [facility])    colon cancer  . Colon cancer ([place]) 01/2019  . Hypertension 01/23/2019  . Personal history of chemotherapy 01/2019   colon CA  . SBO (small bowel obstruction) ([place]) 01/23/2019    SURGICAL HISTORY: Past Surgical History:  Procedure Laterality Date  . CESAREAN SECTION     x2  . COLOSTOMY N/A 01/24/2019   Procedure: End Loop Colostomy;  Surgeon: Clovis Riley, MD;  Location: [place];  Service: General;  Laterality: N/A;  . PARTIAL COLECTOMY N/A 01/24/2019   Procedure: PARTIAL COLECTOMY;  Surgeon: Clovis Riley, MD;  Location: [place];  Service: General;  Laterality: N/A;  . PORTACATH PLACEMENT Right 02/28/2019   Procedure:  INSERTION PORT-A-CATH WITH ULTRASOUND GUIDANCE;  Surgeon: Clovis Riley, MD;  Location: [place];  Service: General;  Laterality: Right;    I have reviewed the social history and family history with the patient and they are unchanged from previous note.  ALLERGIES:  has No Known Allergies.  MEDICATIONS:  Current Outpatient Medications  Medication Sig Dispense Refill  . acetaminophen (TYLENOL) 325 MG tablet Take 2 tablets (650 mg total) by mouth every 6 (six) hours as needed.    Marland Kitchen amLODipine (NORVASC) 10 MG tablet Take 1 tablet (10 mg total) by mouth daily. 90 tablet 1  . ferrous sulfate 325 (65 FE) MG tablet TAKE 1 TABLET (325 MG TOTAL) BY MOUTH 2 (TWO) TIMES DAILY WITH A MEAL. 180 tablet 1  . gabapentin (NEURONTIN) 100 MG capsule Take 2 capsules (200 mg total) by mouth 2 (two) times daily. 90 capsule 1  . gabapentin (NEURONTIN) 300 MG capsule Take 1 capsule (300 mg total) by mouth at bedtime. 30 capsule 1  . hydrALAZINE (APRESOLINE) 25 MG tablet TAKE 1 TABLET (25 MG TOTAL) BY MOUTH EVERY 8 (EIGHT) HOURS. 90 tablet 0  . hydrochlorothiazide (HYDRODIURIL) 12.5 MG tablet TAKE 1 TABLET BY MOUTH EVERY DAY 30 tablet 1  . hydrocortisone (ANUSOL-HC) 2.5 % rectal cream Place 1 application rectally 2 (two) times daily. 30 g 0  . Ibuprofen (ADVIL) 200 MG CAPS Take 400 mg by mouth daily as needed (pain).    . isosorbide mononitrate (IMDUR) 30 MG 24 hr tablet Take 1 tablet (30 mg total) by mouth daily. 90 tablet 0  .  lidocaine (LMX) 4 % cream Apply 1 application topically 3 (three) times daily as needed. 30 g 0  . lidocaine-prilocaine (EMLA) cream Apply to affected area once (Patient taking differently: Apply 1 application topically once. Apply to affected area once) 30 g 3  . lisinopril (ZESTRIL) 40 MG tablet TAKE 1 TABLET BY MOUTH EVERY DAY 90 tablet 1  . loperamide (IMODIUM) 2 MG capsule TAKE 1 CAPSULE (2 MG TOTAL) BY MOUTH AS NEEDED FOR DIARRHEA OR LOOSE STOOLS. 90 capsule 0  . metoprolol tartrate  (LOPRESSOR) 50 MG tablet TAKE 1 TABLET BY MOUTH TWICE A DAY 180 tablet 1  . metroNIDAZOLE (FLAGYL) 250 MG tablet Take 1 tablet (250 mg total) by mouth 3 (three) times daily. 1 tablet 0  . ondansetron (ZOFRAN) 4 MG tablet Take 1 tablet (4 mg total) by mouth every 6 (six) hours as needed for nausea. 30 tablet 0  . ondansetron (ZOFRAN) 8 MG tablet Take 1 tablet (8 mg total) by mouth 2 (two) times daily as needed for refractory nausea / vomiting. Start on day 3 after chemotherapy. 30 tablet 1  . oxyCODONE-acetaminophen (PERCOCET/ROXICET) 5-325 MG tablet Take 1-2 tablets by mouth every 6 (six) hours as needed for severe pain. 20 tablet 0  . polycarbophil (FIBERCON) 625 MG tablet Take 1 tablet (625 mg total) by mouth daily. 30 tablet 0  . prochlorperazine (COMPAZINE) 10 MG tablet Take 1 tablet (10 mg total) by mouth every 6 (six) hours as needed (Nausea or vomiting). 30 tablet 1   No current facility-administered medications for this visit.   Facility-Administered Medications Ordered in Other Visits  Medication Dose Route Frequency Provider Last Rate Last Admin  . influenza vac split quadrivalent PF (FLUARIX) injection 0.5 mL  0.5 mL Intramuscular Once Truitt Merle, MD        PHYSICAL EXAMINATION: ECOG PERFORMANCE STATUS: 1 - Symptomatic but completely ambulatory  Vitals:   01/07/20 1013  BP: (!) 154/56  Pulse: (!) 49  Resp: 19  Temp: 98.6 F (37 C)  SpO2: 100%   Filed Weights   01/07/20 1013  Weight: 216 lb (98 kg)    Due to COVID19 we will limit examination to appearance. Patient had no complaints.  GENERAL:alert, no distress and comfortable SKIN: skin color normal, no rashes or significant lesions EYES: normal, Conjunctiva are pink and non-injected, sclera clear  NEURO: alert & oriented x 3 with fluent speech  OROPHARYNX:no exudate, no erythema and lips, buccal mucosa, and tongue normal  LABORATORY DATA:  I have reviewed the data as listed CBC Latest Ref Rng & Units 01/07/2020  12/11/2019 11/26/2019  WBC 4.0 - 10.5 K/uL 6.6 6.4 5.2  Hemoglobin 12.0 - 15.0 g/dL 11.1(L) 11.4(L) 11.1(L)  Hematocrit 36 - 46 % 35.5(L) 36.1 35.0(L)  Platelets 150 - 400 K/uL 173 136(L) 147(L)     CMP Latest Ref Rng & Units 01/07/2020 12/11/2019 11/26/2019  Glucose 70 - 99 mg/dL 86 76 78  BUN 6 - 20 mg/dL 29(H) 27(H) 25(H)  Creatinine 0.44 - 1.00 mg/dL 1.04(H) 0.93 0.99  Sodium 135 - 145 mmol/L 140 137 140  Potassium 3.5 - 5.1 mmol/L 5.2(H) 5.1 5.2(H)  Chloride 98 - 111 mmol/L 116(H) 110 113(H)  CO2 22 - 32 mmol/L 20(L) 18(L) 22  Calcium 8.9 - 10.3 mg/dL 9.6 9.8 10.1  Total Protein 6.5 - 8.1 g/dL 7.4 7.8 7.4  Total Bilirubin 0.3 - 1.2 mg/dL <0.2(L) 0.3 0.3  Alkaline Phos 38 - 126 U/L 136(H) 122 135(H)  AST 15 -  41 U/L 32 31 26  ALT 0 - 44 U/L 36 28 22      RADIOGRAPHIC STUDIES: I have personally reviewed the radiological images as listed and agreed with the findings in the report. No results found.   ASSESSMENT & PLAN:  Makaelah Cranfield is a 59 y.o. female with   1. Adenocarcinoma of transverse colon, moderately differentiated, pT4aN1aM1a stage IV with liverand nodalmetastasis,MSS, KRAS G12S(+) -Shewas diagnosed in 01/2019 afteremergent colectomy and liver biopsy. Pathology showed stage IV colonic adenocarcinoma metastatic to liver.11/23/20PET showsknown liver metastasis and metastatic lymphadenopathy in chest and right axilla.  -Wepreviouslydiscussed her cancer is stage IV and no longer eligible for surgery. The likelihood of curing this is very lowdue to her diffuse metastasis, but still very treatable. -[name] shows MSI stable disease, Kras+. There are no targetable mutation, she is not a candidate for EGFRinhibitoror immunotherapy.Avastin was added with start of full dose FOLFOX3 weeks ago. -I started her on first-line5-FU for 2 cycles on 03/11/20 while her surgical wound healed. She started full doseFOLFOXwith avastin q2weeks on 1/4/21to control her  disease.Due to neuropathy Oxaliplatin was stopped after 09/24/19. She is now on maintenance Bevacizumab and 5FU q2weeks.  -For convenience I discussed switching 5FU to oral chemo Xeloda BID 2 weeks on/1 week off. She is agreeable. I called in today and plan to start after next visit. Will continue bevacizumab q3weeks when she is on Xeloda  -I discussed goal is to stay on maintenance therapy for as long as possible, but will change her treatment if she progresses which is inavadable.  -Labs reviewed and adequate to proceed with 5FU, leucovorin and bevacizumab today.  -F/u in 2 weeks   2. Chemotherapy induced peripheral neuropathy (CIPN), G2 -secondary to Oxaliplatin, developed after cycle 12 FOLFOX. Oxaliplatin d/c after C15 on 09/24/19 -Neuropathy in fingers and feet are moderate and stable with mild decreased function in fingers.  -She is currently on gabapentin100 mg AM and 300 mg qHS and titrate up if needed.  -I encouraged her to watch her balance and her driving.   3.Nutrition/Anorexia  -she reportedly weighed 253 lbs a few years ago, and weighed 203 lbs at symptom onset~11/2018 -Continue to f/u withdietician -She has been able to gain weight on chemo. She plans to get complete dentures soon.   4. 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   5. HTN, uncontrolled -on hydralazine, isosorbide, lisinopril, metoprolol, amlodipine and HCTZ  -Will monitor on bevacizumab.   6. Goals of care discussion, Social support -The patient understands the goal of care is palliative. -she is full code now -She worked in [employer] at 2 different jobs, but has been out of work lately due to her symptoms  -She is not getting disability, no pay, but her employer kept her on their insurance plan -She may be eligible for Medicaid  7. Rectal  pain, Hemorrhoids, Constipation  -She has had mildly bleeding external hemorrhoids lately, which has caused her pain. Has not required pain medication.  -I also discussed she can see GI for possible hemorrhoids banding.  -For constipation she has Miralax   PLAN: -I called in Xeloda 2039m BID today. Will start after next visit.  -Labs reviewed and adequate to proceed with bevacizumab, 5FU and Leucovorin today  -Lab, flush, F/u and bevacizumab, 5FU and Leucovorin in 2 weeks    No  problem-specific Assessment & Plan notes found for this encounter.   No orders of the defined types were placed in this encounter.  All questions were answered. The patient knows to call the clinic with any problems, questions or concerns. No barriers to learning was detected. The total time spent in the appointment was 30 minutes.     Truitt Merle, MD 01/07/2020   I, Joslyn Devon, am acting as scribe for Truitt Merle, MD.   I have reviewed the above documentation for accuracy and completeness, and I agree with the above.

## 2020-01-07 ENCOUNTER — Inpatient Hospital Stay: Payer: BC Managed Care – PPO

## 2020-01-07 ENCOUNTER — Telehealth: Payer: Self-pay

## 2020-01-07 ENCOUNTER — Encounter: Payer: Self-pay | Admitting: Hematology

## 2020-01-07 ENCOUNTER — Inpatient Hospital Stay (HOSPITAL_BASED_OUTPATIENT_CLINIC_OR_DEPARTMENT_OTHER): Payer: BC Managed Care – PPO | Admitting: Hematology

## 2020-01-07 ENCOUNTER — Inpatient Hospital Stay: Payer: BC Managed Care – PPO | Attending: Nurse Practitioner

## 2020-01-07 ENCOUNTER — Other Ambulatory Visit: Payer: Self-pay

## 2020-01-07 ENCOUNTER — Telehealth: Payer: Self-pay | Admitting: Hematology

## 2020-01-07 ENCOUNTER — Telehealth: Payer: Self-pay | Admitting: Pharmacist

## 2020-01-07 VITALS — BP 154/56 | HR 49 | Temp 98.6°F | Resp 19 | Ht 67.0 in | Wt 216.0 lb

## 2020-01-07 DIAGNOSIS — C184 Malignant neoplasm of transverse colon: Secondary | ICD-10-CM | POA: Diagnosis not present

## 2020-01-07 DIAGNOSIS — D630 Anemia in neoplastic disease: Secondary | ICD-10-CM | POA: Insufficient documentation

## 2020-01-07 DIAGNOSIS — C189 Malignant neoplasm of colon, unspecified: Secondary | ICD-10-CM

## 2020-01-07 DIAGNOSIS — C787 Secondary malignant neoplasm of liver and intrahepatic bile duct: Secondary | ICD-10-CM | POA: Insufficient documentation

## 2020-01-07 DIAGNOSIS — Z5111 Encounter for antineoplastic chemotherapy: Secondary | ICD-10-CM | POA: Diagnosis not present

## 2020-01-07 DIAGNOSIS — I10 Essential (primary) hypertension: Secondary | ICD-10-CM

## 2020-01-07 DIAGNOSIS — Z452 Encounter for adjustment and management of vascular access device: Secondary | ICD-10-CM | POA: Insufficient documentation

## 2020-01-07 DIAGNOSIS — R001 Bradycardia, unspecified: Secondary | ICD-10-CM | POA: Insufficient documentation

## 2020-01-07 DIAGNOSIS — Z7189 Other specified counseling: Secondary | ICD-10-CM

## 2020-01-07 DIAGNOSIS — D500 Iron deficiency anemia secondary to blood loss (chronic): Secondary | ICD-10-CM

## 2020-01-07 DIAGNOSIS — D509 Iron deficiency anemia, unspecified: Secondary | ICD-10-CM | POA: Diagnosis not present

## 2020-01-07 DIAGNOSIS — C9000 Multiple myeloma not having achieved remission: Secondary | ICD-10-CM

## 2020-01-07 DIAGNOSIS — Z95828 Presence of other vascular implants and grafts: Secondary | ICD-10-CM

## 2020-01-07 DIAGNOSIS — Z5112 Encounter for antineoplastic immunotherapy: Secondary | ICD-10-CM | POA: Insufficient documentation

## 2020-01-07 DIAGNOSIS — Z23 Encounter for immunization: Secondary | ICD-10-CM | POA: Insufficient documentation

## 2020-01-07 LAB — IRON AND TIBC
Iron: 92 ug/dL (ref 41–142)
Saturation Ratios: 35 % (ref 21–57)
TIBC: 264 ug/dL (ref 236–444)
UIBC: 172 ug/dL (ref 120–384)

## 2020-01-07 LAB — CBC WITH DIFFERENTIAL (CANCER CENTER ONLY)
Abs Immature Granulocytes: 0.03 10*3/uL (ref 0.00–0.07)
Basophils Absolute: 0.1 10*3/uL (ref 0.0–0.1)
Basophils Relative: 1 %
Eosinophils Absolute: 0.2 10*3/uL (ref 0.0–0.5)
Eosinophils Relative: 3 %
HCT: 35.5 % — ABNORMAL LOW (ref 36.0–46.0)
Hemoglobin: 11.1 g/dL — ABNORMAL LOW (ref 12.0–15.0)
Immature Granulocytes: 1 %
Lymphocytes Relative: 40 %
Lymphs Abs: 2.7 10*3/uL (ref 0.7–4.0)
MCH: 30.8 pg (ref 26.0–34.0)
MCHC: 31.3 g/dL (ref 30.0–36.0)
MCV: 98.6 fL (ref 80.0–100.0)
Monocytes Absolute: 0.7 10*3/uL (ref 0.1–1.0)
Monocytes Relative: 10 %
Neutro Abs: 3 10*3/uL (ref 1.7–7.7)
Neutrophils Relative %: 45 %
Platelet Count: 173 10*3/uL (ref 150–400)
RBC: 3.6 MIL/uL — ABNORMAL LOW (ref 3.87–5.11)
RDW: 15 % (ref 11.5–15.5)
WBC Count: 6.6 10*3/uL (ref 4.0–10.5)
nRBC: 0 % (ref 0.0–0.2)

## 2020-01-07 LAB — CMP (CANCER CENTER ONLY)
ALT: 36 U/L (ref 0–44)
AST: 32 U/L (ref 15–41)
Albumin: 3.4 g/dL — ABNORMAL LOW (ref 3.5–5.0)
Alkaline Phosphatase: 136 U/L — ABNORMAL HIGH (ref 38–126)
Anion gap: 4 — ABNORMAL LOW (ref 5–15)
BUN: 29 mg/dL — ABNORMAL HIGH (ref 6–20)
CO2: 20 mmol/L — ABNORMAL LOW (ref 22–32)
Calcium: 9.6 mg/dL (ref 8.9–10.3)
Chloride: 116 mmol/L — ABNORMAL HIGH (ref 98–111)
Creatinine: 1.04 mg/dL — ABNORMAL HIGH (ref 0.44–1.00)
GFR, Est AFR Am: >60 mL/min (ref >60)
GFR, Estimated: 59 mL/min — ABNORMAL LOW (ref >60)
Glucose, Bld: 86 mg/dL (ref 70–99)
Potassium: 5.2 mmol/L — ABNORMAL HIGH (ref 3.5–5.1)
Sodium: 140 mmol/L (ref 135–145)
Total Bilirubin: <0.2 mg/dL — ABNORMAL LOW (ref 0.3–1.2)
Total Protein: 7.4 g/dL (ref 6.5–8.1)

## 2020-01-07 LAB — TOTAL PROTEIN, URINE DIPSTICK: Protein, ur: <30 mg/dL — AB

## 2020-01-07 LAB — FERRITIN: Ferritin: 152 ng/mL (ref 11–307)

## 2020-01-07 LAB — CEA (IN HOUSE-CHCC): CEA (CHCC-In House): 107.87 ng/mL — ABNORMAL HIGH (ref 0.00–5.00)

## 2020-01-07 MED ORDER — INFLUENZA VAC SPLIT QUAD 0.5 ML IM SUSY
0.5000 mL | PREFILLED_SYRINGE | Freq: Once | INTRAMUSCULAR | Status: AC
  Administered 2020-01-07: 0.5 mL via INTRAMUSCULAR

## 2020-01-07 MED ORDER — SODIUM CHLORIDE 0.9% FLUSH
10.0000 mL | INTRAVENOUS | Status: DC | PRN
  Administered 2020-01-07: 10 mL
  Filled 2020-01-07: qty 10

## 2020-01-07 MED ORDER — CAPECITABINE 500 MG PO TABS: 1000.0000 mg/m2 | ORAL_TABLET | Freq: Two times a day (BID) | ORAL | 0 refills | Status: DC

## 2020-01-07 MED ORDER — SODIUM CHLORIDE 0.9 % IV SOLN
INTRAVENOUS | Status: DC
  Filled 2020-01-07: qty 250

## 2020-01-07 MED ORDER — SODIUM CHLORIDE 0.9 % IV SOLN
500.0000 mg | Freq: Once | INTRAVENOUS | Status: AC
  Administered 2020-01-07: 500 mg via INTRAVENOUS
  Filled 2020-01-07: qty 4

## 2020-01-07 MED ORDER — PALONOSETRON HCL INJECTION 0.25 MG/5ML
0.2500 mg | Freq: Once | INTRAVENOUS | Status: AC
  Administered 2020-01-07: 0.25 mg via INTRAVENOUS

## 2020-01-07 MED ORDER — SODIUM CHLORIDE 0.9 % IV SOLN
2470.0000 mg/m2 | INTRAVENOUS | Status: DC
  Administered 2020-01-07: 5000 mg via INTRAVENOUS
  Filled 2020-01-07: qty 100

## 2020-01-07 MED ORDER — LEUCOVORIN CALCIUM INJECTION 350 MG
400.0000 mg/m2 | Freq: Once | INTRAVENOUS | Status: AC
  Administered 2020-01-07: 808 mg via INTRAVENOUS
  Filled 2020-01-07: qty 40.4

## 2020-01-07 MED ORDER — PALONOSETRON HCL INJECTION 0.25 MG/5ML
INTRAVENOUS | Status: AC
  Filled 2020-01-07: qty 5

## 2020-01-07 MED ORDER — INFLUENZA VAC SPLIT QUAD 0.5 ML IM SUSY
PREFILLED_SYRINGE | INTRAMUSCULAR | Status: AC
  Filled 2020-01-07: qty 0.5

## 2020-01-07 NOTE — Telephone Encounter (Signed)
Oral Oncology Pharmacist Encounter  Received new prescription for Xeloda (capecitabine) for the treatment of stage IV colon cancer in conjunction with bevacizumab, planned duration until disease progression or unacceptable drug toxicity.  Prescription dose and frequency assessed for appropriateness. Appropriate for therapy initiation. Per MD plan is for patient to start Xeloda after next office visit.   CMP and CBC w/ Diff from 01/07/20 assessed, labs OK for treatment initiation.  Current medication list in Epic reviewed, no relevant/significant DDIs with Xeloda identified.  Evaluated chart and no patient barriers to medication adherence noted.   Patient required to fill Xeloda through [facility]. Rx will be redirected to their dispensing pharmacy.   Oral Oncology Clinic will continue to follow for insurance authorization, copayment issues, initial counseling and start date.  Leron Croak, PharmD, BCPS Hematology/Oncology Clinical Pharmacist [facility] [phone] 01/07/2020 11:38 AM

## 2020-01-07 NOTE — Telephone Encounter (Signed)
Oral Oncology Patient Advocate Encounter  Prior Authorization for Xeloda has been approved.    PA# [id number] Effective dates: 01/07/20 through 01/06/21  Patient must fill at [facility] will continue to follow.   [place] Patient [place] Phone [phone] Fax [phone] 01/07/2020 12:41 PM

## 2020-01-07 NOTE — Telephone Encounter (Signed)
Oral Oncology Patient Advocate Encounter  Received notification from [place] that prior authorization for Xeloda is required.  PA submitted on CoverMyMeds Key [id number] Status is pending  Oral Oncology Clinic will continue to follow.  [place] Patient [place] Black Phone [phone] Fax [phone] 01/07/2020 11:33 AM

## 2020-01-07 NOTE — Patient Instructions (Signed)
[facility] Discharge Instructions for Patients Receiving Chemotherapy  Today you received the following chemotherapy agents: bevacizumab/leucovorin/fluorouracil.  To help prevent nausea and vomiting after your treatment, we encourage you to take your nausea medication as directed.   If you develop nausea and vomiting that is not controlled by your nausea medication, call the clinic.   BELOW ARE SYMPTOMS THAT SHOULD BE REPORTED IMMEDIATELY:  *FEVER GREATER THAN 100.5 F  *CHILLS WITH OR WITHOUT FEVER  NAUSEA AND VOMITING THAT IS NOT CONTROLLED WITH YOUR NAUSEA MEDICATION  *UNUSUAL SHORTNESS OF BREATH  *UNUSUAL BRUISING OR BLEEDING  TENDERNESS IN MOUTH AND THROAT WITH OR WITHOUT PRESENCE OF ULCERS  *URINARY PROBLEMS  *BOWEL PROBLEMS  UNUSUAL RASH Items with * indicate a potential emergency and should be followed up as soon as possible.  Feel free to call the clinic should you have any questions or concerns. The clinic phone number is [phone].  Please show the CHEMO ALERT CARD at check-in to the Emergency Department and triage nurse.   

## 2020-01-07 NOTE — Telephone Encounter (Signed)
Scheduled per 10/4 los. Messaged RN Denny Peon to give pt appt calendar.

## 2020-01-07 NOTE — Progress Notes (Signed)
Patient with weight gain. Current weight is 98 kg. Will adjust bevacizumab dose to reflect current weight per Dr. Ernestina Penna instructions.

## 2020-01-09 ENCOUNTER — Other Ambulatory Visit: Payer: Self-pay

## 2020-01-09 ENCOUNTER — Other Ambulatory Visit: Payer: Self-pay | Admitting: Pharmacist

## 2020-01-09 ENCOUNTER — Inpatient Hospital Stay: Payer: BC Managed Care – PPO

## 2020-01-09 VITALS — BP 127/56 | HR 49 | Temp 98.7°F | Resp 18

## 2020-01-09 DIAGNOSIS — Z95828 Presence of other vascular implants and grafts: Secondary | ICD-10-CM

## 2020-01-09 DIAGNOSIS — C184 Malignant neoplasm of transverse colon: Secondary | ICD-10-CM | POA: Diagnosis not present

## 2020-01-09 DIAGNOSIS — D630 Anemia in neoplastic disease: Secondary | ICD-10-CM | POA: Diagnosis not present

## 2020-01-09 DIAGNOSIS — C787 Secondary malignant neoplasm of liver and intrahepatic bile duct: Secondary | ICD-10-CM

## 2020-01-09 DIAGNOSIS — Z5111 Encounter for antineoplastic chemotherapy: Secondary | ICD-10-CM | POA: Diagnosis not present

## 2020-01-09 DIAGNOSIS — Z5112 Encounter for antineoplastic immunotherapy: Secondary | ICD-10-CM | POA: Diagnosis not present

## 2020-01-09 DIAGNOSIS — Z452 Encounter for adjustment and management of vascular access device: Secondary | ICD-10-CM | POA: Diagnosis not present

## 2020-01-09 DIAGNOSIS — Z23 Encounter for immunization: Secondary | ICD-10-CM | POA: Diagnosis not present

## 2020-01-09 DIAGNOSIS — R001 Bradycardia, unspecified: Secondary | ICD-10-CM | POA: Diagnosis not present

## 2020-01-09 DIAGNOSIS — D509 Iron deficiency anemia, unspecified: Secondary | ICD-10-CM | POA: Diagnosis not present

## 2020-01-09 MED ORDER — SODIUM CHLORIDE 0.9% FLUSH
10.0000 mL | INTRAVENOUS | Status: DC | PRN
  Administered 2020-01-09: 10 mL
  Filled 2020-01-09: qty 10

## 2020-01-09 MED ORDER — CAPECITABINE 500 MG PO TABS: 1000.0000 mg/m2 | ORAL_TABLET | Freq: Two times a day (BID) | ORAL | 0 refills | Status: DC

## 2020-01-09 MED ORDER — HEPARIN SOD (PORK) LOCK FLUSH 100 UNIT/ML IV SOLN
500.0000 [IU] | Freq: Once | INTRAVENOUS | Status: AC | PRN
  Administered 2020-01-09: 500 [IU]
  Filled 2020-01-09: qty 5

## 2020-01-09 NOTE — Patient Instructions (Signed)

## 2020-01-15 NOTE — Telephone Encounter (Signed)
Oral Chemotherapy Pharmacist Encounter  I spoke with patient for overview of: Xeloda (capecitabine) for the  treatment of stage IV colon cancer, in conjunction with bevacizumab, planned duration until disease progression or unacceptable drug toxicity.  Counseled patient on administration, dosing, side effects, monitoring, drug-food interactions, safe handling, storage, and disposal.  Patient will take Xeloda 500mg  tablets, 4 tablets (2000mg ) by mouth in AM and 4 tabs (2000mg ) by mouth in PM, within 30 minutes of finishing meals, for 14 days on, 7 days off, repeated every 21 days.  Xeloda start date: patient knows to wait and start medication after MD visit on 01/21/20  Adverse effects include but are not limited to: fatigue, decreased blood counts, GI upset, diarrhea, mouth sores, and hand-foot syndrome.  Patient will obtain anti diarrheal and alert the office of 4 or more loose stools above baseline.  Reviewed with patient importance of keeping a medication schedule and plan for any missed doses. No barriers to medication adherence identified.  Medication reconciliation performed and medication/allergy list updated.  Patient is required to fill Xeloda through [facility]. Patient provided with [facility]'s phone number [phone])  All questions answered.  Ms. Malachi voiced understanding and appreciation.   Medication education handout placed in mail for patient. Patient knows to call the office with questions or concerns. Oral Chemotherapy Clinic phone number provided to patient.   Leron Croak, PharmD, BCPS Hematology/Oncology Clinical Pharmacist [facility] [phone] 01/15/2020 2:17 PM

## 2020-01-17 ENCOUNTER — Other Ambulatory Visit: Payer: Self-pay | Admitting: Hematology

## 2020-01-17 DIAGNOSIS — C189 Malignant neoplasm of colon, unspecified: Secondary | ICD-10-CM

## 2020-01-19 ENCOUNTER — Other Ambulatory Visit: Payer: Self-pay | Admitting: Nurse Practitioner

## 2020-01-20 NOTE — Progress Notes (Signed)
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 Bowel-gas pattern consistent with small bowel obstruction. No free air. 2. No acute chest findings.   01/23/2019 Imaging   CT AP IMPRESSION: Obstructing mid transverse colonic mass with mild regional adenopathy and hepatic metastatic disease. The mass likely extends through the serosa; no ascites or peritoneal nodularity.   01/24/2019 Surgery   Surgeon: Clovis Riley MD Assistant: [name] Procedure performed: Transverse colectomy with end colostomy, liver biopsy Procedure classification: URGENT/EMERGENT Preop diagnosis: Obstructing, metastatic transverse colon mass Post-op diagnosis/intraop findings: Same   01/24/2019 Pathology Results   FINAL MICROSCOPIC DIAGNOSIS:   A. COLON, TRANSVERSE, RESECTION:  Colonic adenocarcinoma, 5 cm.  Carcinoma extends into pericolonic connective tissue and focally to  serosal surface.  Margins not involved.  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   B. LIVER NODULE, LEFT, BIOPSY:  Metastatic adenocarcinoma.    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 Hypermetabolic metastatic disease in the liver and mediastinal/hilar/axillary lymph nodes. 2. Focal hypermetabolism in the rectum. Continued attention on follow-up exams is warranted. 3. Focal hypermetabolism medial to the right adrenal gland may be within a metastatic lymph node, better visualized on 01/23/2019. 4. Aortic atherosclerosis (ICD10-170.0). Coronary artery calcification.   03/12/2019 -  Chemotherapy   She started 5FU q2weeks on 03/12/19 for 2 cycles. She started full dose FOLFOX with Avastin on 04/09/19. Oxaliplatin dose reduced repeatedly due to neuropathy C12 and held since C16 on 10/09/19. Now on maintenance Avastin and 5FU q2weeks since 10/09/19   05/31/2019 Imaging   Restaging CT CAP IMPRESSION: 1. Similar to mild interval decrease in size of multiple hepatic lesions, partially calcified. 2. 2 mm right upper lobe pulmonary nodule. Recommend attention on follow-up. 3. Emphysema and aortic atherosclerosis.   08/23/2019 Imaging   CT CAP w contrast  IMPRESSION: 1. The dominant peripheral right liver metastasis has mildly increased. Other smaller liver metastases are stable. 2. Otherwise no new or progressive metastatic disease in the chest, abdomen or pelvis. 3. Aortic Atherosclerosis (ICD10-I70.0) and Emphysema (ICD10-J43.9).   11/27/2019 Imaging   CT CAP w contrast  IMPRESSION: Status post transverse colectomy with right mid abdominal colostomy.   Mildly progressive hepatic metastases, as above.   No evidence of metastatic disease in the chest. Small mediastinal lymph nodes are within normal limits.   Additional stable ancillary findings as above.   01/21/2020 -  Chemotherapy   The patient had bevacizumab-bvzr (ZIRABEV) 1,500 mg in sodium chloride  0.9 %  100 mL chemo infusion, 15 mg/kg = 1,500 mg, Intravenous,  Once, 1 of 4 cycles Administration: 1,500 mg (01/21/2020)  for chemotherapy treatment.      CURRENT THERAPY: maintenance Avastin and 5FU q2weeks since 10/09/19, change to maintenance xeloda 2000 mg BID days 1-14 q21 days and q3 weeks avastin (15 mg/kg) starting 01/21/20  INTERVAL HISTORY: [name] returns for f/u and treatment as scheduled.  Received the delivery of Xeloda pills, brought it with her but has not started.  She is doing well, no new changes.  She is eating and drinking well, energy adequate.  Neuropathy is at baseline, she is "managing it."  Denies new pain, nausea, vomiting, constipation, diarrhea, fever, chills, cough, chest pain, dyspnea.  She notes she has a heart murmur, denies palpitations, prior arrhythmias or any heart history.   MEDICAL HISTORY:  Past Medical History:  Diagnosis Date  . Anemia    low iron  . Cancer [facility])    colon cancer  . Colon cancer ([place]) 01/2019  . Hypertension 01/23/2019  . Personal history of chemotherapy 01/2019   colon CA  . SBO (small bowel obstruction) ([place]) 01/23/2019    SURGICAL HISTORY: Past Surgical History:  Procedure Laterality Date  . CESAREAN SECTION     x2  . COLOSTOMY N/A 01/24/2019   Procedure: End Loop Colostomy;  Surgeon: Clovis Riley, MD;  Location: [place];  Service: General;  Laterality: N/A;  . PARTIAL COLECTOMY N/A 01/24/2019   Procedure: PARTIAL COLECTOMY;  Surgeon: Clovis Riley, MD;  Location: [place];  Service: General;  Laterality: N/A;  . PORTACATH PLACEMENT Right 02/28/2019   Procedure: INSERTION PORT-A-CATH WITH ULTRASOUND GUIDANCE;  Surgeon: Clovis Riley, MD;  Location: [place];  Service: General;  Laterality: Right;    I have reviewed the social history and family history with the patient and they are unchanged from previous note.  ALLERGIES:  has No Known Allergies.  MEDICATIONS:  Current Outpatient Medications  Medication  Sig Dispense Refill  . acetaminophen (TYLENOL) 325 MG tablet Take 2 tablets (650 mg total) by mouth every 6 (six) hours as needed.    Marland Kitchen amLODipine (NORVASC) 10 MG tablet Take 1 tablet (10 mg total) by mouth daily. 90 tablet 1  . capecitabine (XELODA) 500 MG tablet Take 4 tablets (2,000 mg total) by mouth 2 (two) times daily after a meal. Take for 14 days on, 7 days off, repeat every 21 days. 112 tablet 0  . ferrous sulfate 325 (65 FE) MG tablet TAKE 1 TABLET (325 MG TOTAL) BY MOUTH 2 (TWO) TIMES DAILY WITH A MEAL. 180 tablet 1  . gabapentin (NEURONTIN) 100 MG capsule Take 2 capsules (200 mg total) by mouth 2 (two) times daily. 90 capsule 1  . gabapentin (NEURONTIN) 300 MG capsule Take 1 capsule (300 mg total) by mouth at bedtime. 30 capsule 1  . hydrALAZINE (APRESOLINE) 25 MG tablet TAKE 1 TABLET (25 MG TOTAL) BY MOUTH EVERY 8 (EIGHT) HOURS. 90 tablet 0  . hydrochlorothiazide (HYDRODIURIL) 12.5 MG tablet TAKE 1 TABLET BY MOUTH EVERY DAY 30 tablet 1  . hydrocortisone (ANUSOL-HC) 2.5 % rectal cream Place 1 application rectally 2 (two) times daily. 30 g 0  . Ibuprofen (ADVIL) 200 MG CAPS Take 400 mg by mouth daily as needed (pain).    . isosorbide mononitrate (IMDUR) 30 MG 24 hr tablet Take 1 tablet (30 mg total) by mouth daily. 90 tablet 0  . lidocaine (LMX) 4 % cream Apply 1 application topically  3 (three) times daily as needed. 30 g 0  . lisinopril (ZESTRIL) 40 MG tablet TAKE 1 TABLET BY MOUTH EVERY DAY 90 tablet 1  . loperamide (IMODIUM) 2 MG capsule TAKE 1 CAPSULE (2 MG TOTAL) BY MOUTH AS NEEDED FOR DIARRHEA OR LOOSE STOOLS. 90 capsule 0  . metoprolol tartrate (LOPRESSOR) 50 MG tablet TAKE 1 TABLET BY MOUTH TWICE A DAY 180 tablet 1  . metroNIDAZOLE (FLAGYL) 250 MG tablet Take 1 tablet (250 mg total) by mouth 3 (three) times daily. 1 tablet 0  . ondansetron (ZOFRAN) 4 MG tablet Take 1 tablet (4 mg total) by mouth every 6 (six) hours as needed for nausea. 30 tablet 0  . oxyCODONE-acetaminophen  (PERCOCET/ROXICET) 5-325 MG tablet Take 1-2 tablets by mouth every 6 (six) hours as needed for severe pain. 20 tablet 0  . polycarbophil (FIBERCON) 625 MG tablet Take 1 tablet (625 mg total) by mouth daily. 30 tablet 0   No current facility-administered medications for this visit.   Facility-Administered Medications Ordered in Other Visits  Medication Dose Route Frequency Provider Last Rate Last Admin  . sodium chloride flush (NS) 0.9 % injection 10 mL  10 mL Intracatheter PRN Truitt Merle, MD   10 mL at 01/21/20 1243    PHYSICAL EXAMINATION: ECOG PERFORMANCE STATUS: 0 - Asymptomatic  Vitals:   01/21/20 1014  BP: (!) 180/83  Pulse: (!) 45  Resp: 18  Temp: (!) 97 F (36.1 C)  SpO2: 100%   Filed Weights   01/21/20 1014  Weight: 218 lb 1.6 oz (98.9 kg)    GENERAL:alert, no distress and comfortable SKIN: Palms with hyperpigmentation.  No rash to exposed skin EYES: sclera clear LUNGS: clear with normal breathing effort HEART: Bradycardia with abnormal rhythm, murmur, no lower extremity edema NEURO: alert & oriented x 3 with fluent speech, no focal motor deficits PAC without erythema  LABORATORY DATA:  I have reviewed the data as listed CBC Latest Ref Rng & Units 01/21/2020 01/07/2020 12/11/2019  WBC 4.0 - 10.5 K/uL 6.3 6.6 6.4  Hemoglobin 12.0 - 15.0 g/dL 11.1(L) 11.1(L) 11.4(L)  Hematocrit 36 - 46 % 34.9(L) 35.5(L) 36.1  Platelets 150 - 400 K/uL 138(L) 173 136(L)     CMP Latest Ref Rng & Units 01/21/2020 01/07/2020 12/11/2019  Glucose 70 - 99 mg/dL 76 86 76  BUN 6 - 20 mg/dL 22(H) 29(H) 27(H)  Creatinine 0.44 - 1.00 mg/dL 0.91 1.04(H) 0.93  Sodium 135 - 145 mmol/L 136 140 137  Potassium 3.5 - 5.1 mmol/L 4.8 5.2(H) 5.1  Chloride 98 - 111 mmol/L 111 116(H) 110  CO2 22 - 32 mmol/L 22 20(L) 18(L)  Calcium 8.9 - 10.3 mg/dL 9.9 9.6 9.8  Total Protein 6.5 - 8.1 g/dL 7.6 7.4 7.8  Total Bilirubin 0.3 - 1.2 mg/dL <0.2(L) <0.2(L) 0.3  Alkaline Phos 38 - 126 U/L 116 136(H) 122  AST 15  - 41 U/L 23 32 31  ALT 0 - 44 U/L 23 36 28      RADIOGRAPHIC STUDIES: I have personally reviewed the radiological images as listed and agreed with the findings in the report. No results found.   ASSESSMENT & PLAN: Casey Black a 58y.o.femalewith   1. Adenocarcinoma of transverse colon, moderately differentiated, pT4aN1aM1a stage IV with liverand nodalmetastasis, MMR normal  -Diagnosed in 01/2019 afteremergent colectomy and liver biopsy. 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  -PET from 02/26/19 showsknown liver metastasis and metastatic lymphadenopathy in chest and right axilla.  -  [name] shows MSI stable disease, Kras+ No targetable mutation, she is not a candidate for EGFR or immunotherapy. -Began palliative first line chemoon 03/12/2019, received 5FU/leuc with first 2 cycles forlarge open abdominal wound after surgery. She started full dose FOLFOX and avastin with cycle 2, s/p15cycles of treatment -Interim scans have been stable overall  -She developed CIPN after cycle 12, oxali dose reduced to 70 mg/m2 with cycle 13. Symptoms progressed and further reduced to 40 mg/m2 from cycle 14and 15 -She has progressive neuropathybutotherwise tolerating treatment well. No new symptoms or side effects.Oxaliplatin was held from cycle 16 (09/05/19) -continues maintenance 5FU/leuc and avastin  -For convenience, the recommendation was to switch from 5-FU to oral chemo Xeloda twice daily x2 weeks on 1 week off and continue bevacizumab (q3 weeks on xeloda)  2. 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 She has moderate sensory deficit in her feet but still able to function and ambulate well. No pain or sensitivity  -Oxaliplatin  furtherdose reducedto 40 mg/m2 cycle 14. -persistent for cycle 15, dose continued 40 mg/m2 and initiated gabapentin  -progressive, Oxaliplatin heldfrom cycle 16 -Titrating gabapentin(up to 100 mg AM and 300 mg qHS starting 10/30/19), stable  3.Nutrition/Anorexia  -she reportedly weighed 253 lbs a few years ago, and weighed 203 lbs at symptom onset~11/2018 -190 lbsin 02/2019.  -Continue to f/u withdietician -she has been able to gain weight on chemo;continue monitoring  4. Iron deficiency anemia, and anemia secondary to cancer -She had low ferritinand low TIBC -IDA responding to ferrous sulfate BID.She can reduce to oral iron once daily for now -iron studies remain normal, continue monitoring  5. HTN -Initiallyon hydralazine, isosorbide, lisinopril, metoprolol. -Avastin and anxiety contributing to BP elevation in clinic. Received clonidine PRN on chemo days.  -Recently added amlodipine and HCTZ with improvement. No longer on metoprolol. - Tolerating well without sx of hypotension  6. Social support -She worked in [employer] at 2 different jobs, but has been out of work lately due to her symptoms  -She is not getting disability, no pay, but her employer kept her on their insurance plan -has been referred to SW to discuss financial option/resources  7. Goals of care discussion -Goal is palliative, to control disease and prolong her life. -wehavediscussedher cancer is not curable at this stage, and that we will continue treatment as long as she can tolerate and her disease is controlled.she understands her treatment plan will evolve depending on her response and side effects of treatment.  -Full code  Disposition: Ms. Jurek appears stable.  She completed another cycle of maintenance 5-FU/leuc and Avastin.  She tolerates treatment well with stable mild neuropathy.  She is able to recover and function well.  An EKG was obtained for bradycardia and  abnormal heart rhythm, shows sinus brady and age indeterminate septal infarct. She is on amlodipine, hydrochlorothiazide, and isosorbide. She is asymptomatic of bradycardia. I have referred her to cardiology to determine if she needs further work-up and management.  She knows to call if she develops dizziness, lightheadedness, fatigue, pre-syncope or other changes.   We reviewed the CBC and CMP from today, she has mild thrombocytopenia and anemia, CMP improved. CEA increased while on chemo break. Will continue trending.   The recommendation is to change to maintenance Xeloda 2000 mg BID on days  1 -14 q. 21 days and Avastin 15 mg/kg q3 weeks.  We reviewed potential toxicities and symptom management for Xeloda. Labs adequate to proceed.   She will return for lab and follow-up in 3 weeks with cycle 2, or sooner if needed.  The case was discussed with Dr. Burr Medico.   Orders Placed This Encounter  Procedures  . Ambulatory referral to Cardiology    Referral Priority:   Routine    Referral Type:   Consultation    Referral Reason:   Specialty Services Required    Requested Specialty:   Cardiology    Number of Visits Requested:   1  . EKG 12-Lead    Bracycardia, abnormal rhythm   All questions were answered. The patient knows to call the clinic with any problems, questions or concerns. No barriers to learning were detected.  Total encounter time was 30 minutes.     [name], NP 01/21/20

## 2020-01-21 ENCOUNTER — Inpatient Hospital Stay (HOSPITAL_BASED_OUTPATIENT_CLINIC_OR_DEPARTMENT_OTHER): Payer: BC Managed Care – PPO | Admitting: Nurse Practitioner

## 2020-01-21 ENCOUNTER — Other Ambulatory Visit: Payer: Self-pay

## 2020-01-21 ENCOUNTER — Other Ambulatory Visit: Payer: Self-pay | Admitting: Hematology

## 2020-01-21 ENCOUNTER — Encounter: Payer: Self-pay | Admitting: Nurse Practitioner

## 2020-01-21 ENCOUNTER — Inpatient Hospital Stay: Payer: BC Managed Care – PPO

## 2020-01-21 VITALS — BP 180/83 | HR 45 | Temp 97.0°F | Resp 18 | Ht 67.0 in | Wt 218.1 lb

## 2020-01-21 VITALS — BP 160/75

## 2020-01-21 DIAGNOSIS — Z5112 Encounter for antineoplastic immunotherapy: Secondary | ICD-10-CM | POA: Diagnosis not present

## 2020-01-21 DIAGNOSIS — C787 Secondary malignant neoplasm of liver and intrahepatic bile duct: Secondary | ICD-10-CM | POA: Diagnosis not present

## 2020-01-21 DIAGNOSIS — C189 Malignant neoplasm of colon, unspecified: Secondary | ICD-10-CM

## 2020-01-21 DIAGNOSIS — R9431 Abnormal electrocardiogram [ECG] [EKG]: Secondary | ICD-10-CM | POA: Diagnosis not present

## 2020-01-21 DIAGNOSIS — D509 Iron deficiency anemia, unspecified: Secondary | ICD-10-CM | POA: Diagnosis not present

## 2020-01-21 DIAGNOSIS — C184 Malignant neoplasm of transverse colon: Secondary | ICD-10-CM | POA: Diagnosis not present

## 2020-01-21 DIAGNOSIS — Z95828 Presence of other vascular implants and grafts: Secondary | ICD-10-CM

## 2020-01-21 DIAGNOSIS — Z23 Encounter for immunization: Secondary | ICD-10-CM | POA: Diagnosis not present

## 2020-01-21 DIAGNOSIS — Z452 Encounter for adjustment and management of vascular access device: Secondary | ICD-10-CM | POA: Diagnosis not present

## 2020-01-21 DIAGNOSIS — Z5111 Encounter for antineoplastic chemotherapy: Secondary | ICD-10-CM | POA: Diagnosis not present

## 2020-01-21 DIAGNOSIS — D630 Anemia in neoplastic disease: Secondary | ICD-10-CM | POA: Diagnosis not present

## 2020-01-21 DIAGNOSIS — R001 Bradycardia, unspecified: Secondary | ICD-10-CM | POA: Diagnosis not present

## 2020-01-21 LAB — CBC WITH DIFFERENTIAL (CANCER CENTER ONLY)
Abs Immature Granulocytes: 0.02 10*3/uL (ref 0.00–0.07)
Basophils Absolute: 0 10*3/uL (ref 0.0–0.1)
Basophils Relative: 1 %
Eosinophils Absolute: 0.2 10*3/uL (ref 0.0–0.5)
Eosinophils Relative: 3 %
HCT: 34.9 % — ABNORMAL LOW (ref 36.0–46.0)
Hemoglobin: 11.1 g/dL — ABNORMAL LOW (ref 12.0–15.0)
Immature Granulocytes: 0 %
Lymphocytes Relative: 32 %
Lymphs Abs: 2 10*3/uL (ref 0.7–4.0)
MCH: 29.8 pg (ref 26.0–34.0)
MCHC: 31.8 g/dL (ref 30.0–36.0)
MCV: 93.6 fL (ref 80.0–100.0)
Monocytes Absolute: 0.6 10*3/uL (ref 0.1–1.0)
Monocytes Relative: 9 %
Neutro Abs: 3.5 10*3/uL (ref 1.7–7.7)
Neutrophils Relative %: 55 %
Platelet Count: 138 10*3/uL — ABNORMAL LOW (ref 150–400)
RBC: 3.73 MIL/uL — ABNORMAL LOW (ref 3.87–5.11)
RDW: 14.9 % (ref 11.5–15.5)
WBC Count: 6.3 10*3/uL (ref 4.0–10.5)
nRBC: 0 % (ref 0.0–0.2)

## 2020-01-21 LAB — CMP (CANCER CENTER ONLY)
ALT: 23 U/L (ref 0–44)
AST: 23 U/L (ref 15–41)
Albumin: 3.5 g/dL (ref 3.5–5.0)
Alkaline Phosphatase: 116 U/L (ref 38–126)
Anion gap: 3 — ABNORMAL LOW (ref 5–15)
BUN: 22 mg/dL — ABNORMAL HIGH (ref 6–20)
CO2: 22 mmol/L (ref 22–32)
Calcium: 9.9 mg/dL (ref 8.9–10.3)
Chloride: 111 mmol/L (ref 98–111)
Creatinine: 0.91 mg/dL (ref 0.44–1.00)
GFR, Estimated: >60 mL/min (ref >60)
Glucose, Bld: 76 mg/dL (ref 70–99)
Potassium: 4.8 mmol/L (ref 3.5–5.1)
Sodium: 136 mmol/L (ref 135–145)
Total Bilirubin: <0.2 mg/dL — ABNORMAL LOW (ref 0.3–1.2)
Total Protein: 7.6 g/dL (ref 6.5–8.1)

## 2020-01-21 LAB — TOTAL PROTEIN, URINE DIPSTICK: Protein, ur: NEGATIVE mg/dL

## 2020-01-21 MED ORDER — SODIUM CHLORIDE 0.9% FLUSH
10.0000 mL | INTRAVENOUS | Status: DC | PRN
  Administered 2020-01-21: 10 mL
  Filled 2020-01-21: qty 10

## 2020-01-21 MED ORDER — HEPARIN SOD (PORK) LOCK FLUSH 100 UNIT/ML IV SOLN
500.0000 [IU] | Freq: Once | INTRAVENOUS | Status: AC | PRN
  Administered 2020-01-21: 500 [IU]
  Filled 2020-01-21: qty 5

## 2020-01-21 MED ORDER — SODIUM CHLORIDE 0.9 % IV SOLN
Freq: Once | INTRAVENOUS | Status: AC
  Filled 2020-01-21: qty 250

## 2020-01-21 MED ORDER — SODIUM CHLORIDE 0.9 % IV SOLN
15.0000 mg/kg | Freq: Once | INTRAVENOUS | Status: AC
  Administered 2020-01-21: 1500 mg via INTRAVENOUS
  Filled 2020-01-21: qty 48

## 2020-01-21 NOTE — Patient Instructions (Signed)
[facility] Discharge Instructions for Patients Receiving Chemotherapy  Today you received the following chemotherapy agents: bevacizumab  To help prevent nausea and vomiting after your treatment, we encourage you to take your nausea medication as directed.   If you develop nausea and vomiting that is not controlled by your nausea medication, call the clinic.   BELOW ARE SYMPTOMS THAT SHOULD BE REPORTED IMMEDIATELY:  *FEVER GREATER THAN 100.5 F  *CHILLS WITH OR WITHOUT FEVER  NAUSEA AND VOMITING THAT IS NOT CONTROLLED WITH YOUR NAUSEA MEDICATION  *UNUSUAL SHORTNESS OF BREATH  *UNUSUAL BRUISING OR BLEEDING  TENDERNESS IN MOUTH AND THROAT WITH OR WITHOUT PRESENCE OF ULCERS  *URINARY PROBLEMS  *BOWEL PROBLEMS  UNUSUAL RASH Items with * indicate a potential emergency and should be followed up as soon as possible.  Feel free to call the clinic should you have any questions or concerns. The clinic phone number is [phone].  Please show the CHEMO ALERT CARD at check-in to the Emergency Department and triage nurse.   

## 2020-01-22 ENCOUNTER — Telehealth: Payer: Self-pay | Admitting: Nurse Practitioner

## 2020-01-22 NOTE — Telephone Encounter (Signed)
Scheduled per 10/18 los. Pt is aware of appt times and date. Faxed referral [facility].

## 2020-01-24 NOTE — Telephone Encounter (Signed)
Lacie, Sending to you for approval or refusal.  This has been in the refill inbasket for 5 days so I am making sure you know about it.  Threasa Beards

## 2020-01-25 ENCOUNTER — Telehealth: Payer: Self-pay | Admitting: Internal Medicine

## 2020-01-25 DIAGNOSIS — G620 Drug-induced polyneuropathy: Secondary | ICD-10-CM

## 2020-01-25 NOTE — Telephone Encounter (Signed)
Pt is calling in stating that she needs a refill on gabapentin (NEURONTIN) 100 MG  Pharm:  [facility] on [address].  Pt is also saying something about a PA is needed for one of her Rx's but not sure of the name of the medication.

## 2020-01-26 NOTE — Telephone Encounter (Signed)
Have not yet received PA for this patient.

## 2020-01-29 ENCOUNTER — Other Ambulatory Visit (HOSPITAL_COMMUNITY)
Admission: RE | Admit: 2020-01-29 | Discharge: 2020-01-29 | Disposition: A | Discharge status: Home / Self Care | Payer: BC Managed Care – PPO | Source: Ambulatory Visit | Attending: Internal Medicine | Admitting: Internal Medicine

## 2020-01-29 ENCOUNTER — Other Ambulatory Visit: Payer: Self-pay

## 2020-01-29 ENCOUNTER — Ambulatory Visit (INDEPENDENT_AMBULATORY_CARE_PROVIDER_SITE_OTHER): Payer: BC Managed Care – PPO | Admitting: Internal Medicine

## 2020-01-29 VITALS — BP 130/70

## 2020-01-29 DIAGNOSIS — G620 Drug-induced polyneuropathy: Secondary | ICD-10-CM | POA: Diagnosis not present

## 2020-01-29 DIAGNOSIS — K56609 Unspecified intestinal obstruction, unspecified as to partial versus complete obstruction: Secondary | ICD-10-CM | POA: Diagnosis not present

## 2020-01-29 DIAGNOSIS — Z933 Colostomy status: Secondary | ICD-10-CM | POA: Diagnosis not present

## 2020-01-29 DIAGNOSIS — Z124 Encounter for screening for malignant neoplasm of cervix: Secondary | ICD-10-CM

## 2020-01-29 DIAGNOSIS — I10 Essential (primary) hypertension: Secondary | ICD-10-CM | POA: Diagnosis not present

## 2020-01-29 MED ORDER — GABAPENTIN 100 MG PO CAPS: 200.0000 mg | ORAL_CAPSULE | Freq: Two times a day (BID) | ORAL | 1 refills | Status: DC

## 2020-01-29 NOTE — Progress Notes (Signed)
Established Patient Office Visit     This visit occurred during the SARS-CoV-2 public health emergency.  Safety protocols were in place, including screening questions prior to the visit, additional usage of staff PPE, and extensive cleaning of exam room while observing appropriate contact time as indicated for disinfecting solutions.    CC/Reason for Visit: Pap smear, follow-up chronic conditions  HPI: Casey Black is a 59 y.o. female who is coming in today for the above mentioned reasons. Past Medical History is significant for: Metastatic adenocarcinoma of the colon currently on active chemotherapy followed by oncology, she has been switched from FOLFOX to Xeloda and bevacizumab.  She has developed severe peripheral neuropathy due to oxaliplatin.  She has a history of hypertension on multidrug regimen that has been well controlled.  She is here today for Pap smear per her request.  She feels like she is otherwise doing well.   Past Medical/Surgical History: Past Medical History:  Diagnosis Date  . Anemia    low iron  . Cancer [facility])    colon cancer  . Colon cancer ([place]) 01/2019  . Hypertension 01/23/2019  . Personal history of chemotherapy 01/2019   colon CA  . SBO (small bowel obstruction) ([place]) 01/23/2019    Past Surgical History:  Procedure Laterality Date  . CESAREAN SECTION     x2  . COLOSTOMY N/A 01/24/2019   Procedure: End Loop Colostomy;  Surgeon: Clovis Riley, MD;  Location: [place];  Service: General;  Laterality: N/A;  . PARTIAL COLECTOMY N/A 01/24/2019   Procedure: PARTIAL COLECTOMY;  Surgeon: Clovis Riley, MD;  Location: [place];  Service: General;  Laterality: N/A;  . PORTACATH PLACEMENT Right 02/28/2019   Procedure: INSERTION PORT-A-CATH WITH ULTRASOUND GUIDANCE;  Surgeon: Clovis Riley, MD;  Location: [place];  Service: General;  Laterality: Right;    Social History:  reports that she quit smoking about 12 months ago. Her smoking use  included cigarettes. She has a 14.50 pack-year smoking history. She has never used smokeless tobacco. She reports previous alcohol use. She reports that she does not use drugs.  Allergies: No Known Allergies  Family History:  Family History  Problem Relation Age of Onset  . Kidney failure Father   . Hypertension Father   . Hypertension Sister      Current Outpatient Medications:  .  acetaminophen (TYLENOL) 325 MG tablet, Take 2 tablets (650 mg total) by mouth every 6 (six) hours as needed., Disp: , Rfl:  .  amLODipine (NORVASC) 10 MG tablet, Take 1 tablet (10 mg total) by mouth daily., Disp: 90 tablet, Rfl: 1 .  capecitabine (XELODA) 500 MG tablet, Take 4 tablets (2,000 mg total) by mouth 2 (two) times daily after a meal. Take for 14 days on, 7 days off, repeat every 21 days., Disp: 112 tablet, Rfl: 0 .  ferrous sulfate 325 (65 FE) MG tablet, TAKE 1 TABLET (325 MG TOTAL) BY MOUTH 2 (TWO) TIMES DAILY WITH A MEAL., Disp: 180 tablet, Rfl: 1 .  gabapentin (NEURONTIN) 100 MG capsule, Take 2 capsules (200 mg total) by mouth 2 (two) times daily., Disp: 180 capsule, Rfl: 1 .  gabapentin (NEURONTIN) 300 MG capsule, Take 1 capsule (300 mg total) by mouth at bedtime., Disp: 30 capsule, Rfl: 1 .  hydrALAZINE (APRESOLINE) 25 MG tablet, TAKE 1 TABLET (25 MG TOTAL) BY MOUTH EVERY 8 (EIGHT) HOURS., Disp: 90 tablet, Rfl: 0 .  hydrochlorothiazide (HYDRODIURIL) 12.5 MG tablet, TAKE 1 TABLET  BY MOUTH EVERY DAY, Disp: 30 tablet, Rfl: 1 .  hydrocortisone (ANUSOL-HC) 2.5 % rectal cream, Place 1 application rectally 2 (two) times daily., Disp: 30 g, Rfl: 0 .  Ibuprofen (ADVIL) 200 MG CAPS, Take 400 mg by mouth daily as needed (pain)., Disp: , Rfl:  .  isosorbide mononitrate (IMDUR) 30 MG 24 hr tablet, Take 1 tablet (30 mg total) by mouth daily., Disp: 90 tablet, Rfl: 0 .  lidocaine (LMX) 4 % cream, Apply 1 application topically 3 (three) times daily as needed., Disp: 30 g, Rfl: 0 .  lisinopril (ZESTRIL) 40 MG  tablet, TAKE 1 TABLET BY MOUTH EVERY DAY, Disp: 90 tablet, Rfl: 1 .  loperamide (IMODIUM) 2 MG capsule, TAKE 1 CAPSULE (2 MG TOTAL) BY MOUTH AS NEEDED FOR DIARRHEA OR LOOSE STOOLS., Disp: 90 capsule, Rfl: 0 .  metoprolol tartrate (LOPRESSOR) 50 MG tablet, TAKE 1 TABLET BY MOUTH TWICE A DAY, Disp: 180 tablet, Rfl: 1 .  metroNIDAZOLE (FLAGYL) 250 MG tablet, Take 1 tablet (250 mg total) by mouth 3 (three) times daily., Disp: 1 tablet, Rfl: 0 .  ondansetron (ZOFRAN) 4 MG tablet, Take 1 tablet (4 mg total) by mouth every 6 (six) hours as needed for nausea., Disp: 30 tablet, Rfl: 0 .  oxyCODONE-acetaminophen (PERCOCET/ROXICET) 5-325 MG tablet, Take 1-2 tablets by mouth every 6 (six) hours as needed for severe pain., Disp: 20 tablet, Rfl: 0 .  polycarbophil (FIBERCON) 625 MG tablet, Take 1 tablet (625 mg total) by mouth daily., Disp: 30 tablet, Rfl: 0  Review of Systems:  Constitutional: Denies fever, chills, diaphoresis, appetite change and fatigue.  HEENT: Denies photophobia, eye pain, redness, hearing loss, ear pain, congestion, sore throat, rhinorrhea, sneezing, mouth sores, trouble swallowing, neck pain, neck stiffness and tinnitus.   Respiratory: Denies SOB, DOE, cough, chest tightness,  and wheezing.   Cardiovascular: Denies chest pain, palpitations and leg swelling.  Gastrointestinal: Denies nausea, vomiting, abdominal pain, diarrhea, constipation, blood in stool and abdominal distention.  Genitourinary: Denies dysuria, urgency, frequency, hematuria, flank pain and difficulty urinating.  Endocrine: Denies: hot or cold intolerance, sweats, changes in hair or nails, polyuria, polydipsia. Musculoskeletal: Denies myalgias, back pain, joint swelling, arthralgias and gait problem.  Skin: Denies pallor, rash and wound.  Neurological: Denies dizziness, seizures, syncope, weakness, light-headedness, numbness and headaches.  Hematological: Denies adenopathy. Easy bruising, personal or family bleeding  history  Psychiatric/Behavioral: Denies suicidal ideation, mood changes, confusion, nervousness, sleep disturbance and agitation    Physical Exam: Vitals:   01/29/20 0806  BP: 130/70    There is no height or weight on file to calculate BMI.   Constitutional: NAD, calm, comfortable Eyes: PERRL, lids and conjunctivae normal ENMT: Mucous membranes are moist.  Respiratory: clear to auscultation bilaterally, no wheezing, no crackles. Normal respiratory effort. No accessory muscle use.  Cardiovascular: Regular rate and rhythm, no murmurs / rubs / gallops. No extremity edema.  Psychiatric: Normal judgment and insight. Alert and oriented x 3. Normal mood.    Impression and Plan:  Cervical cancer screening  - Plan: PAP [[place]]  Drug-induced polyneuropathy (HCC)  -Gabapentin has been refilled today.  Primary hypertension -Well-controlled on current drug regimen consisting of hydrochlorothiazide 12.5 mg daily, lisinopril 40 mg daily, amlodipine 10 mg daily, isosorbide mononitrate 30 mg daily, hydralazine 25 mg 3 times a day.    Patient Instructions  -Nice seeing you today!!  -Schedule follow up in 6 months.     Lelon Frohlich, MD [facility]

## 2020-01-29 NOTE — Patient Instructions (Signed)
-  Nice seeing you today!!  -Schedule follow up in 6 months.

## 2020-02-01 LAB — CYTOLOGY - PAP
Adequacy: ABSENT
Comment: NEGATIVE
Diagnosis: NEGATIVE
High risk HPV: NEGATIVE

## 2020-02-08 NOTE — Progress Notes (Signed)
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 Bowel-gas pattern consistent with small bowel obstruction. No free air. 2. No acute chest findings.   01/23/2019 Imaging   CT AP IMPRESSION: Obstructing mid transverse colonic mass with mild regional adenopathy and hepatic metastatic disease. The mass likely extends through the serosa; no ascites or peritoneal nodularity.   01/24/2019 Surgery   Surgeon: Clovis Riley MD Assistant: [name] Procedure performed: Transverse colectomy with end colostomy, liver biopsy Procedure classification: URGENT/EMERGENT Preop diagnosis: Obstructing, metastatic transverse colon mass Post-op diagnosis/intraop findings: Same   01/24/2019 Pathology Results   FINAL MICROSCOPIC DIAGNOSIS:   A. COLON, TRANSVERSE, RESECTION:  Colonic adenocarcinoma, 5 cm.  Carcinoma extends into pericolonic connective tissue and focally to  serosal surface.  Margins not involved.  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   B. LIVER NODULE, LEFT, BIOPSY:  Metastatic adenocarcinoma.    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 Hypermetabolic metastatic disease in the liver and mediastinal/hilar/axillary lymph nodes. 2. Focal hypermetabolism in the rectum. Continued attention on follow-up exams is warranted. 3. Focal hypermetabolism medial to the right adrenal gland may be within a metastatic lymph node, better visualized on 01/23/2019. 4. Aortic atherosclerosis (ICD10-170.0). Coronary artery calcification.   03/12/2019 -  Chemotherapy   She started 5FU q2weeks on 03/12/19 for 2 cycles. She started full dose FOLFOX with Avastin on 04/09/19. Oxaliplatin dose reduced repeatedly due to neuropathy C12 and held since C16 on 10/09/19. Now on maintenance Avastin and 5FU q2weeks since 10/09/19       -Maintenance change to maintenance xeloda 2000 mg BID days 1-14 q21 days and q3 weeks Zirabev (15 mg/kg) starting 01/21/20. First cycle was taken 1065m BID due to misunderstanding.    05/31/2019 Imaging   Restaging CT CAP IMPRESSION: 1. Similar to mild interval decrease in size of multiple hepatic lesions, partially calcified. 2. 2 mm right upper lobe pulmonary nodule. Recommend attention on follow-up. 3. Emphysema and aortic atherosclerosis.   08/23/2019 Imaging   CT CAP w contrast  IMPRESSION: 1. The dominant peripheral right liver metastasis has mildly increased. Other smaller liver metastases are stable. 2. Otherwise no new or progressive metastatic disease in the chest, abdomen or pelvis. 3. Aortic Atherosclerosis (ICD10-I70.0) and Emphysema (ICD10-J43.9).   11/27/2019 Imaging   CT CAP w contrast  IMPRESSION: Status post transverse colectomy with right mid abdominal colostomy.   Mildly progressive hepatic metastases, as above.   No evidence of metastatic  disease in the chest.  Small mediastinal lymph nodes are within normal limits.   Additional stable ancillary findings as above.      CURRENT THERAPY:  Maintenance Avastin and 5FU q2weeks since 10/09/19, change to maintenance xeloda 2000 mg BID days 1-14 q21 days and q3 weeks avastin (15 mg/kg) starting 01/21/20. First cycle was taken 1065m BID due to misunderstanding.    INTERVAL HISTORY:  Casey Polzinis here for a follow up and treatment. She presents to the clinic alone. She notes she has been on oral Xeloda and completed 1 cycle. She notes her appetite is doing well and able to gain weight. She notes left ankle swelling. She notes this has been present for 1 week and has occasionally left ankle swelling before. She notes prior doppler 2 months ago. She notes she is also tolerating Avastin as well.     REVIEW OF SYSTEMS:   Constitutional: Denies fevers, chills or abnormal weight loss Eyes: Denies blurriness of vision Ears, nose, mouth, throat, and face: Denies mucositis or sore throat Respiratory: Denies cough, dyspnea or wheezes Cardiovascular: Denies palpitation, chest discomfort (+) Left ankle swelling, stable  Gastrointestinal:  Denies nausea, heartburn or change in bowel habits Skin: Denies abnormal skin rashes Lymphatics: Denies new lymphadenopathy or easy bruising Neurological:Denies numbness, tingling or new weaknesses Behavioral/Psych: Mood is stable, no new changes  All other systems were reviewed with the patient and are negative.  MEDICAL HISTORY:  Past Medical History:  Diagnosis Date   Anemia    low iron   Cancer (HCC)    colon cancer   [place]   Hypertension 01/23/2019   Personal history of chemotherapy 01/2019   colon CA   [place] 01[place]23[place]    SURGICAL HISTORY: Past Surgical History:  Procedure Laterality Date   CESAREAN SECTION     x2   COLOSTOMY N/A 01/24/2019   Procedure: End Loop Colostomy;  Surgeon:  CClovis Riley MD;  [place];  Service: General;  Laterality: N/A;   PARTIAL COLECTOMY N/A 01/24/2019   Procedure: PARTIAL COLECTOMY;  Surgeon: CClovis Riley MD;  [place];  Service: General;  Laterality: N/A;   PORTACATH PLACEMENT Right 02/28/2019   Procedure: INSERTION PORT-A-CATH WITH ULTRASOUND GUIDANCE;  Surgeon: CClovis Riley MD;  [place]  Service: General;  Laterality: Right;    I have reviewed the social history and family history with the patient and they are unchanged from previous note.  ALLERGIES:  has No Known Allergies.  MEDICATIONS:  Current Outpatient Medications  Medication Sig Dispense Refill   acetaminophen (TYLENOL) 325 MG tablet Take 2 tablets (650 mg total) by mouth every 6 (six) hours as needed.     amLODipine (NORVASC) 10 MG tablet Take 1 tablet (10 mg total) by mouth daily. 90 tablet 1   capecitabine (XELODA) 500 MG tablet Take 4 tablets (2,000 mg total) by mouth 2 (two) times daily after a meal. Take for 14 days on, 7 days off, repeat every 21 days. 112 tablet 0   ferrous sulfate 325 (65 FE) MG tablet TAKE 1 TABLET (325 MG TOTAL) BY MOUTH 2 (TWO) TIMES DAILY WITH A MEAL. 180 tablet 1   gabapentin (NEURONTIN) 100 MG capsule Take 2 capsules (200 mg total) by mouth 2 (two) times daily. 180 capsule 1   gabapentin (NEURONTIN) 300 MG capsule Take 1 capsule (300 mg total) by mouth at bedtime. 30 capsule 1   hydrALAZINE (APRESOLINE) 25  MG tablet TAKE 1 TABLET (25 MG TOTAL) BY MOUTH EVERY 8 (EIGHT) HOURS. 90 tablet 0   hydrochlorothiazide (HYDRODIURIL) 12.5 MG tablet TAKE 1 TABLET BY MOUTH EVERY DAY 30 tablet 1   hydrocortisone (ANUSOL-HC) 2.5 % rectal cream Place 1 application rectally 2 (two) times daily. 30 g 0   Ibuprofen (ADVIL) 200 MG CAPS Take 400 mg by mouth daily as needed (pain).     isosorbide mononitrate (IMDUR) 30 MG 24 hr tablet Take 1 tablet (30 mg total) by mouth daily. 90 tablet 0   lidocaine (LMX) 4 % cream  Apply 1 application topically 3 (three) times daily as needed. 30 g 0   lisinopril (ZESTRIL) 40 MG tablet TAKE 1 TABLET BY MOUTH EVERY DAY 90 tablet 1   loperamide (IMODIUM) 2 MG capsule TAKE 1 CAPSULE (2 MG TOTAL) BY MOUTH AS NEEDED FOR DIARRHEA OR LOOSE STOOLS. 90 capsule 0   metoprolol tartrate (LOPRESSOR) 50 MG tablet TAKE 1 TABLET BY MOUTH TWICE A DAY 180 tablet 1   metroNIDAZOLE (FLAGYL) 250 MG tablet Take 1 tablet (250 mg total) by mouth 3 (three) times daily. 1 tablet 0   ondansetron (ZOFRAN) 4 MG tablet Take 1 tablet (4 mg total) by mouth every 6 (six) hours as needed for nausea. 30 tablet 0   oxyCODONE-acetaminophen (PERCOCET/ROXICET) 5-325 MG tablet Take 1-2 tablets by mouth every 6 (six) hours as needed for severe pain. 20 tablet 0   polycarbophil (FIBERCON) 625 MG tablet Take 1 tablet (625 mg total) by mouth daily. 30 tablet 0   No current facility-administered medications for this visit.   Facility-Administered Medications Ordered in Other Visits  Medication Dose Route Frequency Provider Last Rate Last Admin   sodium chloride flush (NS) 0.9 % injection 10 mL  10 mL Intracatheter PRN Truitt Merle, MD   10 mL at 02/11/20 1023    PHYSICAL EXAMINATION: ECOG PERFORMANCE STATUS: 1 - Symptomatic but completely ambulatory  Vitals:   02/11/20 0835  BP: (!) 173/70  Pulse: (!) 52  Resp: 18  Temp: (!) 97.5 F (36.4 C)  SpO2: 100%   Filed Weights   02/11/20 0835  Weight: 223 lb 14.4 oz (101.6 kg)    GENERAL:alert, no distress and comfortable SKIN: skin color, texture, turgor are normal, no rashes or significant lesions EYES: normal, Conjunctiva are pink and non-injected, sclera clear  NECK: supple, thyroid normal size, non-tender, without nodularity LYMPH:  no palpable lymphadenopathy in the cervical, axillary  LUNGS: clear to auscultation and percussion with normal breathing effort HEART: regular rate & rhythm and no murmurs (+) Left ankle edema ABDOMEN:abdomen soft,  non-tender and normal bowel sounds Musculoskeletal:no cyanosis of digits and no clubbing  NEURO: alert & oriented x 3 with fluent speech, no focal motor/sensory deficits  LABORATORY DATA:  I have reviewed the data as listed CBC Latest Ref Rng & Units 02/11/2020 01/21/2020 01/07/2020  WBC 4.0 - 10.5 K/uL 6.4 6.3 6.6  Hemoglobin 12.0 - 15.0 g/dL 11.3(L) 11.1(L) 11.1(L)  Hematocrit 36 - 46 % 35.7(L) 34.9(L) 35.5(L)  Platelets 150 - 400 K/uL 162 138(L) 173     CMP Latest Ref Rng & Units 02/11/2020 01/21/2020 01/07/2020  Glucose 70 - 99 mg/dL 86 76 86  BUN 6 - 20 mg/dL 24(H) 22(H) 29(H)  Creatinine 0.44 - 1.00 mg/dL 1.06(H) 0.91 1.04(H)  Sodium 135 - 145 mmol/L 138 136 140  Potassium 3.5 - 5.1 mmol/L 4.7 4.8 5.2(H)  Chloride 98 - 111 mmol/L 111 111 116(H)  CO2  22 - 32 mmol/L 20(L) 22 20(L)  Calcium 8.9 - 10.3 mg/dL 10.0 9.9 9.6  Total Protein 6.5 - 8.1 g/dL 7.6 7.6 7.4  Total Bilirubin 0.3 - 1.2 mg/dL 0.3 <0.2(L) <0.2(L)  Alkaline Phos 38 - 126 U/L 105 116 136(H)  AST 15 - 41 U/L 28 23 32  ALT 0 - 44 U/L 33 23 36      RADIOGRAPHIC STUDIES: I have personally reviewed the radiological images as listed and agreed with the findings in the report. No results found.   ASSESSMENT & PLAN:  Casey Black is a 59 y.o. female with    1. Adenocarcinoma of transverse colon, moderately differentiated, pT4aN1aM1a stage IV with liverand nodalmetastasis,MSS, KRAS G12S(+) -Shewas diagnosed in 01/2019 afteremergent colectomy and liver biopsy. Pathology showed stage IV colonic adenocarcinoma metastatic to liver.11/23/20PET showsknown liver metastasis and metastatic lymphadenopathy in chest and right axilla.  -Wepreviouslydiscussed her cancer is stage IV and no longer eligible for surgery. The likelihood of curing this is very lowdue to her diffuse metastasis, but still very treatable. -[name] shows MSI stable disease, Kras+. There are no targetable mutation, she is not a  candidate for EGFRinhibitoror immunotherapy.Avastin was added with start of full dose FOLFOX3 weeks ago. -I started her on first-line5-FU for 2 cycles on 03/11/20 while her surgical wound healed. She started full doseFOLFOXwith avastin q2weeks on 1/4/21to control her disease.Due to neuropathy Oxaliplatin was stopped after 09/24/19. She is now on maintenance therapy.  -For convenience I switched her maintenance therapy to oral chemo Xeloda 2042m BID 2 weeks on/1 week off and Zirabev q3weeks starting 01/21/20.  -Due to misunderstanding she took Xeloda 10040mBID for the first cycle. She tolerated well with dry skin. I reviewed how to take Xeloda with her. Will increase to 200059mith C2.  -Labs reviewed and adequate to proceed with Zirabev today. Start C2 Xeloda today at 2000m44mD. I encouraged her to watch for increased side effects.  -F/u in 3 weeks with next CT scan. If she has disease progression will stop maintenance therapy and switch her to next line treatment. Her CEA slightly increased lately.  -f/u in 3 weeks    2. Chemotherapy induced peripheral neuropathy (CIPN), G2 -secondary to Oxaliplatin, developed after cycle 12 FOLFOX. Oxaliplatin d/c after C15 on 09/24/19 -Neuropathy in fingers and feet are moderate and stable with mild decreased function in fingers.  -She is currently ongabapentin100 mg AM and 300 mg qHS and titrate up if needed.  -I encouraged her to watch her balance and her driving.  3.Nutrition/Anorexia  -she reportedly weighed 253 lbs a few years ago, and weighed 203 lbs at symptom onset~11/2018 -Continue to f/u withdietician -She plans to get complete dentures soon.  -She continues to gain weight back.   4. 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   5. HTN, uncontrolled -on hydralazine, isosorbide,  lisinopril, metoprolol, amlodipine and HCTZ  -Will monitor on bevacizumab.   6.Goals of care discussion,Social support -The patient understands the goal of care is palliative. -she is full code now -She worked in the [employer] different jobs, but has been out of work lately due to her symptoms  -She is not getting disability, no pay, but her employer kept her on their insurance plan -She may be eligible for Medicaid  7. Rectal pain, Hemorrhoids, Constipation  -She has had  mildly bleeding external hemorrhoids lately, which has caused her pain. Has not required pain medication.  -I also discussed she can see GI for possible hemorrhoids banding.  -For constipation she has Miralax   PLAN: -Labs reviewed and adequate to proceed with C2 Zirabev today with 7.59m/kg  -Start C2 Xeloda at 20060mBID, 2 weeks on/1 week off -F/u and Zirabev in 3 weeks with Lab and CT CAP w contrast a few days before.    No problem-specific Assessment & Plan notes found for this encounter.   Orders Placed This Encounter  Procedures   CT CHEST ABDOMEN PELVIS W CONTRAST    Standing Status:   Future    Standing Expiration Date:   02/10/2021    Order Specific Question:   If indicated for the ordered procedure, I authorize the administration of contrast media per Radiology protocol    Answer:   Yes    Order Specific Question:   Is patient pregnant?    Answer:   No    Order Specific Question:   [facility]    [facility]  Order Specific Question:   Is Oral Contrast requested for this exam?    Answer:   Yes, Per Radiology protocol    Order Specific Question:   Reason for Exam (SYMPTOM  OR DIAGNOSIS REQUIRED)    Answer:   restaging   All questions were answered. The patient knows to call the clinic with any problems, questions or concerns. No barriers to learning was detected. The total time spent in the appointment was 30 minutes.     YaTruitt MerleMD 02/11/2020     I, AmJoslyn Devonam acting as scribe for YaTruitt MerleMD.   I have reviewed the above documentation for accuracy and completeness, and I agree with the above.

## 2020-02-10 ENCOUNTER — Other Ambulatory Visit: Payer: Self-pay | Admitting: Hematology

## 2020-02-11 ENCOUNTER — Inpatient Hospital Stay: Payer: BC Managed Care – PPO | Attending: Nurse Practitioner

## 2020-02-11 ENCOUNTER — Other Ambulatory Visit: Payer: Self-pay

## 2020-02-11 ENCOUNTER — Inpatient Hospital Stay: Payer: BC Managed Care – PPO

## 2020-02-11 ENCOUNTER — Encounter: Payer: Self-pay | Admitting: Hematology

## 2020-02-11 ENCOUNTER — Inpatient Hospital Stay (HOSPITAL_BASED_OUTPATIENT_CLINIC_OR_DEPARTMENT_OTHER): Payer: BC Managed Care – PPO | Admitting: Hematology

## 2020-02-11 VITALS — BP 159/81 | HR 49

## 2020-02-11 VITALS — BP 173/70 | HR 52 | Temp 97.5°F | Resp 18 | Ht 67.0 in | Wt 223.9 lb

## 2020-02-11 DIAGNOSIS — Z95828 Presence of other vascular implants and grafts: Secondary | ICD-10-CM

## 2020-02-11 DIAGNOSIS — D630 Anemia in neoplastic disease: Secondary | ICD-10-CM | POA: Diagnosis not present

## 2020-02-11 DIAGNOSIS — C184 Malignant neoplasm of transverse colon: Secondary | ICD-10-CM | POA: Insufficient documentation

## 2020-02-11 DIAGNOSIS — D500 Iron deficiency anemia secondary to blood loss (chronic): Secondary | ICD-10-CM

## 2020-02-11 DIAGNOSIS — C787 Secondary malignant neoplasm of liver and intrahepatic bile duct: Secondary | ICD-10-CM | POA: Diagnosis not present

## 2020-02-11 DIAGNOSIS — I10 Essential (primary) hypertension: Secondary | ICD-10-CM

## 2020-02-11 DIAGNOSIS — C778 Secondary and unspecified malignant neoplasm of lymph nodes of multiple regions: Secondary | ICD-10-CM | POA: Insufficient documentation

## 2020-02-11 DIAGNOSIS — C189 Malignant neoplasm of colon, unspecified: Secondary | ICD-10-CM

## 2020-02-11 DIAGNOSIS — Z5112 Encounter for antineoplastic immunotherapy: Secondary | ICD-10-CM | POA: Diagnosis not present

## 2020-02-11 DIAGNOSIS — D509 Iron deficiency anemia, unspecified: Secondary | ICD-10-CM | POA: Diagnosis not present

## 2020-02-11 LAB — CMP (CANCER CENTER ONLY)
ALT: 33 U/L (ref 0–44)
AST: 28 U/L (ref 15–41)
Albumin: 3.8 g/dL (ref 3.5–5.0)
Alkaline Phosphatase: 105 U/L (ref 38–126)
Anion gap: 7 (ref 5–15)
BUN: 24 mg/dL — ABNORMAL HIGH (ref 6–20)
CO2: 20 mmol/L — ABNORMAL LOW (ref 22–32)
Calcium: 10 mg/dL (ref 8.9–10.3)
Chloride: 111 mmol/L (ref 98–111)
Creatinine: 1.06 mg/dL — ABNORMAL HIGH (ref 0.44–1.00)
GFR, Estimated: >60 mL/min (ref >60)
Glucose, Bld: 86 mg/dL (ref 70–99)
Potassium: 4.7 mmol/L (ref 3.5–5.1)
Sodium: 138 mmol/L (ref 135–145)
Total Bilirubin: 0.3 mg/dL (ref 0.3–1.2)
Total Protein: 7.6 g/dL (ref 6.5–8.1)

## 2020-02-11 LAB — CBC WITH DIFFERENTIAL (CANCER CENTER ONLY)
Abs Immature Granulocytes: 0.02 10*3/uL (ref 0.00–0.07)
Basophils Absolute: 0 10*3/uL (ref 0.0–0.1)
Basophils Relative: 1 %
Eosinophils Absolute: 0.2 10*3/uL (ref 0.0–0.5)
Eosinophils Relative: 3 %
HCT: 35.7 % — ABNORMAL LOW (ref 36.0–46.0)
Hemoglobin: 11.3 g/dL — ABNORMAL LOW (ref 12.0–15.0)
Immature Granulocytes: 0 %
Lymphocytes Relative: 33 %
Lymphs Abs: 2.1 10*3/uL (ref 0.7–4.0)
MCH: 29.5 pg (ref 26.0–34.0)
MCHC: 31.7 g/dL (ref 30.0–36.0)
MCV: 93.2 fL (ref 80.0–100.0)
Monocytes Absolute: 0.6 10*3/uL (ref 0.1–1.0)
Monocytes Relative: 9 %
Neutro Abs: 3.5 10*3/uL (ref 1.7–7.7)
Neutrophils Relative %: 54 %
Platelet Count: 162 10*3/uL (ref 150–400)
RBC: 3.83 MIL/uL — ABNORMAL LOW (ref 3.87–5.11)
RDW: 17 % — ABNORMAL HIGH (ref 11.5–15.5)
WBC Count: 6.4 10*3/uL (ref 4.0–10.5)
nRBC: 0 % (ref 0.0–0.2)

## 2020-02-11 LAB — CEA (IN HOUSE-CHCC): CEA (CHCC-In House): 132.77 ng/mL — ABNORMAL HIGH (ref 0.00–5.00)

## 2020-02-11 LAB — IRON AND TIBC
Iron: 103 ug/dL (ref 41–142)
Saturation Ratios: 34 % (ref 21–57)
TIBC: 303 ug/dL (ref 236–444)
UIBC: 200 ug/dL (ref 120–384)

## 2020-02-11 LAB — TOTAL PROTEIN, URINE DIPSTICK: Protein, ur: NEGATIVE mg/dL

## 2020-02-11 LAB — FERRITIN: Ferritin: 154 ng/mL (ref 11–307)

## 2020-02-11 MED ORDER — SODIUM CHLORIDE 0.9 % IV SOLN
Freq: Once | INTRAVENOUS | Status: AC
  Filled 2020-02-11: qty 250

## 2020-02-11 MED ORDER — HEPARIN SOD (PORK) LOCK FLUSH 100 UNIT/ML IV SOLN
500.0000 [IU] | Freq: Once | INTRAVENOUS | Status: AC | PRN
  Administered 2020-02-11: 500 [IU]
  Filled 2020-02-11: qty 5

## 2020-02-11 MED ORDER — SODIUM CHLORIDE 0.9 % IV SOLN
7.5000 mg/kg | Freq: Once | INTRAVENOUS | Status: AC
  Administered 2020-02-11: 700 mg via INTRAVENOUS
  Filled 2020-02-11: qty 16

## 2020-02-11 MED ORDER — SODIUM CHLORIDE 0.9% FLUSH
10.0000 mL | INTRAVENOUS | Status: DC | PRN
  Administered 2020-02-11: 10 mL
  Filled 2020-02-11: qty 10

## 2020-02-11 NOTE — Patient Instructions (Signed)
[facility] Discharge Instructions for Patients Receiving Chemotherapy  Today you received the following chemotherapy agents: bevacizumab  To help prevent nausea and vomiting after your treatment, we encourage you to take your nausea medication as directed.   If you develop nausea and vomiting that is not controlled by your nausea medication, call the clinic.   BELOW ARE SYMPTOMS THAT SHOULD BE REPORTED IMMEDIATELY:  *FEVER GREATER THAN 100.5 F  *CHILLS WITH OR WITHOUT FEVER  NAUSEA AND VOMITING THAT IS NOT CONTROLLED WITH YOUR NAUSEA MEDICATION  *UNUSUAL SHORTNESS OF BREATH  *UNUSUAL BRUISING OR BLEEDING  TENDERNESS IN MOUTH AND THROAT WITH OR WITHOUT PRESENCE OF ULCERS  *URINARY PROBLEMS  *BOWEL PROBLEMS  UNUSUAL RASH Items with * indicate a potential emergency and should be followed up as soon as possible.  Feel free to call the clinic should you have any questions or concerns. The clinic phone number is [phone].  Please show the CHEMO ALERT CARD at check-in to the Emergency Department and triage nurse.   

## 2020-02-11 NOTE — Patient Instructions (Signed)

## 2020-02-13 ENCOUNTER — Telehealth: Payer: Self-pay | Admitting: Hematology

## 2020-02-13 NOTE — Telephone Encounter (Signed)
No 1/18 los.  °

## 2020-02-15 ENCOUNTER — Other Ambulatory Visit: Payer: Self-pay | Admitting: Hematology

## 2020-02-15 ENCOUNTER — Telehealth: Payer: Self-pay

## 2020-02-15 ENCOUNTER — Other Ambulatory Visit: Payer: Self-pay | Admitting: Internal Medicine

## 2020-02-15 DIAGNOSIS — C189 Malignant neoplasm of colon, unspecified: Secondary | ICD-10-CM

## 2020-02-15 DIAGNOSIS — C787 Secondary malignant neoplasm of liver and intrahepatic bile duct: Secondary | ICD-10-CM

## 2020-02-15 MED ORDER — CAPECITABINE 500 MG PO TABS: 1000.0000 mg/m2 | ORAL_TABLET | Freq: Two times a day (BID) | ORAL | 0 refills | Status: DC

## 2020-02-15 MED ORDER — ISOSORBIDE MONONITRATE ER 30 MG PO TB24
30.0000 mg | ORAL_TABLET | Freq: Every day | ORAL | 0 refills | Status: DC
Start: 2020-02-15 — End: 2020-05-21

## 2020-02-15 NOTE — Telephone Encounter (Signed)
Okay to refill capecitabine (XELODA) 500 MG?

## 2020-02-15 NOTE — Telephone Encounter (Signed)
I spoke with Casey Black and reviewed appt date and time for upcoming CT scan.  I reviewed instructions for [employer].  She verbalized undertanding.

## 2020-02-15 NOTE — Telephone Encounter (Signed)
Pt is calling in for a refill on Rx's capecitabine (XELODA) 500 MG and isosorbide mononitrate (IMDUR) 30 MG stating that she is out of them.  Pharm:  [facility] [address]

## 2020-02-18 NOTE — Telephone Encounter (Signed)
Imdur sent

## 2020-02-20 ENCOUNTER — Other Ambulatory Visit: Payer: Self-pay | Admitting: Nurse Practitioner

## 2020-02-21 ENCOUNTER — Other Ambulatory Visit: Payer: Self-pay

## 2020-02-21 ENCOUNTER — Ambulatory Visit (HOSPITAL_COMMUNITY)
Admission: RE | Admit: 2020-02-21 | Discharge: 2020-02-21 | Disposition: A | Discharge status: Home / Self Care | Payer: BC Managed Care – PPO | Source: Ambulatory Visit | Attending: Hematology | Admitting: Hematology

## 2020-02-21 DIAGNOSIS — I700 Atherosclerosis of aorta: Secondary | ICD-10-CM | POA: Diagnosis not present

## 2020-02-21 DIAGNOSIS — C787 Secondary malignant neoplasm of liver and intrahepatic bile duct: Secondary | ICD-10-CM | POA: Insufficient documentation

## 2020-02-21 DIAGNOSIS — K7689 Other specified diseases of liver: Secondary | ICD-10-CM | POA: Diagnosis not present

## 2020-02-21 DIAGNOSIS — J432 Centrilobular emphysema: Secondary | ICD-10-CM | POA: Diagnosis not present

## 2020-02-21 DIAGNOSIS — C189 Malignant neoplasm of colon, unspecified: Secondary | ICD-10-CM | POA: Diagnosis not present

## 2020-02-21 DIAGNOSIS — I2510 Atherosclerotic heart disease of native coronary artery without angina pectoris: Secondary | ICD-10-CM | POA: Diagnosis not present

## 2020-02-21 DIAGNOSIS — D259 Leiomyoma of uterus, unspecified: Secondary | ICD-10-CM | POA: Diagnosis not present

## 2020-02-21 DIAGNOSIS — M47817 Spondylosis without myelopathy or radiculopathy, lumbosacral region: Secondary | ICD-10-CM | POA: Diagnosis not present

## 2020-02-21 MED ORDER — HEPARIN SOD (PORK) LOCK FLUSH 100 UNIT/ML IV SOLN
INTRAVENOUS | Status: AC
  Administered 2020-02-21: 500 [IU] via INTRAVENOUS
  Filled 2020-02-21: qty 5

## 2020-02-21 MED ORDER — HEPARIN SOD (PORK) LOCK FLUSH 100 UNIT/ML IV SOLN: 500.0000 [IU] | Freq: Once | INTRAVENOUS | Status: AC

## 2020-02-21 MED ORDER — IOHEXOL 300 MG/ML  SOLN
100.0000 mL | Freq: Once | INTRAMUSCULAR | Status: AC | PRN
  Administered 2020-02-21: 100 mL via INTRAVENOUS

## 2020-03-02 NOTE — Progress Notes (Signed)
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 Bowel-gas pattern consistent with small bowel obstruction. No free air. 2. No acute chest findings.   01/23/2019 Imaging   CT AP IMPRESSION: Obstructing mid transverse colonic mass with mild regional adenopathy and hepatic metastatic disease. The mass likely extends through the serosa; no ascites or peritoneal nodularity.   01/24/2019 Surgery   Surgeon: Clovis Riley MD Assistant: [name] Procedure performed: Transverse colectomy with end colostomy, liver biopsy Procedure classification: URGENT/EMERGENT Preop diagnosis: Obstructing, metastatic transverse colon mass Post-op diagnosis/intraop findings: Same   01/24/2019 Pathology Results   FINAL MICROSCOPIC DIAGNOSIS:   A. COLON, TRANSVERSE, RESECTION:  Colonic adenocarcinoma, 5 cm.  Carcinoma extends into pericolonic connective tissue and focally to  serosal surface.  Margins not involved.  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   B. LIVER NODULE, LEFT, BIOPSY:  Metastatic adenocarcinoma.    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 Hypermetabolic metastatic disease in the liver and mediastinal/hilar/axillary lymph nodes. 2. Focal hypermetabolism in the rectum. Continued attention on follow-up exams is warranted. 3. Focal hypermetabolism medial to the right adrenal gland may be within a metastatic lymph node, better visualized on 01/23/2019. 4. Aortic atherosclerosis (ICD10-170.0). Coronary artery calcification.   03/12/2019 -  Chemotherapy   She started 5FU q2weeks on 03/12/19 for 2 cycles. She started full dose FOLFOX with Avastin on 04/09/19. Oxaliplatin dose reduced repeatedly due to neuropathy C12 and held since C16 on 10/09/19. Now on maintenance Avastin and 5FU q2weeks since 10/09/19       -Maintenance change to maintenance xeloda 2000 mg BID days 1-14 q21 days and q3 weeks Zirabev (15 mg/kg) starting 01/21/20. First cycle was taken 1045m BID due to misunderstanding.    05/31/2019 Imaging   Restaging CT CAP IMPRESSION: 1. Similar to mild interval decrease in size of multiple hepatic lesions, partially calcified. 2. 2 mm right upper lobe pulmonary nodule. Recommend attention on follow-up. 3. Emphysema and aortic atherosclerosis.   08/23/2019 Imaging   CT CAP w contrast  IMPRESSION: 1. The dominant peripheral right liver metastasis has mildly increased. Other smaller liver metastases are stable. 2. Otherwise no new or progressive metastatic disease in the chest, abdomen or pelvis. 3. Aortic Atherosclerosis (ICD10-I70.0) and Emphysema (ICD10-J43.9).   11/27/2019 Imaging   CT CAP w contrast  IMPRESSION: Status post transverse colectomy with right mid abdominal colostomy.   Mildly progressive hepatic metastases, as above.   No evidence of metastatic disease in the chest.  Small  mediastinal lymph nodes are within normal limits.   Additional stable ancillary findings as above.   02/21/2020 Imaging   IMPRESSION: 1. Stable hepatic metastatic disease. 2. Aortic atherosclerosis (ICD10-I70.0). Coronary artery calcification. 3.  Emphysema (ICD10-J43.9).     CURRENT THERAPY: Maintenance Avastin and 5FU q2weeks since 10/09/19, change to maintenance xeloda 2000 mg BID days 1-14 q21 days and q3 weeks avastin (15 mg/kg) starting 01/21/20. First cycle was taken 1067m BID due to misunderstanding.   INTERVAL HISTORY: Ms. SSchoenfeldtreturns for follow up and treatment as scheduled. She completed another cycle of Xeloda and bevacizumab, to start new cycle of Xeloda on 11/30. She feels "normal" on treatment with out significant side effects. Energy and appetite are adequate, she remains active. Denies mucositis, nausea/vomiting, diarrhea, hand/foot redness or pain, fever, chills, cough, chest pain, dyspnea, knee pain. Neuropathy is "tolerable," improved since coming off intensive chemo. Left ankle swelling at baseline. All other systems were reviewed with the patient and are negative.  MEDICAL HISTORY:  Past Medical History:  Diagnosis Date  . Anemia    low iron  . Cancer ([facility]    colon cancer  . Colon cancer ([place] 01/2019  . Hypertension 01/23/2019  . Personal history of chemotherapy 01/2019   colon CA  . SBO (small bowel obstruction) [place] 01/23/2019    SURGICAL HISTORY: Past Surgical History:  Procedure Laterality Date  . CESAREAN SECTION     x2  . COLOSTOMY N/A 01/24/2019   Procedure: End Loop Colostomy;  Surgeon: CClovis Riley MD;  [place]  Service: General;  Laterality: N/A;  . PARTIAL COLECTOMY N/A 01/24/2019   Procedure: PARTIAL COLECTOMY;  Surgeon: CClovis Riley MD;  [place]  Service: General;  Laterality: N/A;  . PORTACATH PLACEMENT Right 02/28/2019   Procedure: INSERTION PORT-A-CATH WITH ULTRASOUND GUIDANCE;  Surgeon: CClovis Riley MD;  [place]  Service: General;  Laterality: Right;    I have reviewed the social history and family history with the patient and they are unchanged from previous note.  ALLERGIES:  has No Known Allergies.  MEDICATIONS:  Current Outpatient Medications  Medication Sig Dispense Refill  . acetaminophen (TYLENOL) 325 MG tablet Take 2 tablets (650 mg total) by mouth every 6 (six) hours as needed.    .Marland KitchenamLODipine (NORVASC) 10 MG tablet Take 1 tablet (10 mg total) by mouth daily. 90 tablet 1  . capecitabine (XELODA) 500 MG tablet Take 4 tablets (2,000 mg total) by mouth 2 (two) times daily after a meal. Take for 14 days on, 7 days off, repeat every 21 days. 112 tablet 0  . ferrous sulfate 325 (65 FE) MG tablet TAKE 1 TABLET (325 MG TOTAL) BY MOUTH 2 (TWO) TIMES DAILY WITH A MEAL. 180 tablet 1  . gabapentin (NEURONTIN) 100 MG capsule Take 2 capsules (200 mg total) by mouth 2 (two) times daily. 180 capsule 1  . gabapentin (NEURONTIN) 300 MG capsule Take 1 capsule (300 mg total) by mouth at bedtime. 30 capsule 1  . hydrALAZINE (APRESOLINE) 25 MG tablet TAKE 1 TABLET (25 MG TOTAL) BY MOUTH EVERY 8 (EIGHT) HOURS. 90 tablet 0  . hydrochlorothiazide (HYDRODIURIL) 12.5 MG tablet TAKE 1 TABLET BY MOUTH EVERY DAY 30 tablet 1  . hydrocortisone (ANUSOL-HC) 2.5 % rectal cream Place 1 application rectally 2 (two) times daily. 30 g 0  . Ibuprofen (ADVIL) 200 MG CAPS Take 400 mg by mouth daily as needed (pain).    . isosorbide mononitrate (  IMDUR) 30 MG 24 hr tablet Take 1 tablet (30 mg total) by mouth daily. 90 tablet 0  . lidocaine (LMX) 4 % cream Apply 1 application topically 3 (three) times daily as needed. 30 g 0  . lisinopril (ZESTRIL) 40 MG tablet TAKE 1 TABLET BY MOUTH EVERY DAY 90 tablet 1  . loperamide (IMODIUM) 2 MG capsule TAKE 1 CAPSULE (2 MG TOTAL) BY MOUTH AS NEEDED FOR DIARRHEA OR LOOSE STOOLS. 90 capsule 0  . metoprolol tartrate (LOPRESSOR) 50 MG tablet TAKE 1 TABLET BY MOUTH TWICE A DAY  180 tablet 1  . metroNIDAZOLE (FLAGYL) 250 MG tablet Take 1 tablet (250 mg total) by mouth 3 (three) times daily. 1 tablet 0  . ondansetron (ZOFRAN) 4 MG tablet Take 1 tablet (4 mg total) by mouth every 6 (six) hours as needed for nausea. 30 tablet 0  . oxyCODONE-acetaminophen (PERCOCET/ROXICET) 5-325 MG tablet Take 1-2 tablets by mouth every 6 (six) hours as needed for severe pain. 20 tablet 0  . polycarbophil (FIBERCON) 625 MG tablet Take 1 tablet (625 mg total) by mouth daily. 30 tablet 0   No current facility-administered medications for this visit.    PHYSICAL EXAMINATION: ECOG PERFORMANCE STATUS: 0 - Asymptomatic  Vitals:   03/03/20 0903  BP: (!) 165/71  Pulse: 60  Resp: 18  Temp: 97.8 F (36.6 C)  SpO2: 100%   Filed Weights   03/03/20 0903  Weight: 225 lb 9.6 oz (102.3 kg)    GENERAL:alert, no distress and comfortable SKIN: Palms with hyperpigmentation, no erythema or skin breakdown EYES:  sclera clear LUNGS: clear with normal breathing effort HEART: regular rate & rhythm, mild left ankle edema  ABDOMEN:abdomen soft, non-tender and normal bowel sounds. Right abdominal colostomy with brown formed stool in collection bag NEURO: alert & oriented x 3 with fluent speech PAC without erythema  LABORATORY DATA:  I have reviewed the data as listed CBC Latest Ref Rng & Units 03/03/2020 02/11/2020 01/21/2020  WBC 4.0 - 10.5 K/uL 5.8 6.4 6.3  Hemoglobin 12.0 - 15.0 g/dL 10.7(L) 11.3(L) 11.1(L)  Hematocrit 36 - 46 % 33.9(L) 35.7(L) 34.9(L)  Platelets 150 - 400 K/uL 183 162 138(L)     CMP Latest Ref Rng & Units 03/03/2020 02/11/2020 01/21/2020  Glucose 70 - 99 mg/dL 83 86 76  BUN 6 - 20 mg/dL 34(H) 24(H) 22(H)  Creatinine 0.44 - 1.00 mg/dL 1.18(H) 1.06(H) 0.91  Sodium 135 - 145 mmol/L 141 138 136  Potassium 3.5 - 5.1 mmol/L 4.6 4.7 4.8  Chloride 98 - 111 mmol/L 116(H) 111 111  CO2 22 - 32 mmol/L 18(L) 20(L) 22  Calcium 8.9 - 10.3 mg/dL 9.7 10.0 9.9  Total Protein 6.5 -  8.1 g/dL 7.6 7.6 7.6  Total Bilirubin 0.3 - 1.2 mg/dL 0.3 0.3 <0.2(L)  Alkaline Phos 38 - 126 U/L 95 105 116  AST 15 - 41 U/L _0 ALT 0 - 44 U/L 14 33 23      RADIOGRAPHIC STUDIES: I have personally reviewed the radiological images as listed and agreed with the findings in the report. No results found.   ASSESSMENT & PLAN: Casey Black a [id number].o.femalewith   1. Adenocarcinoma of transverse colon, moderately differentiated, pT4aN1aM1a stage IV with liverand nodalmetastasis, MMR normal  -Diagnosed in 01/2019 afteremergent colectomy and liver biopsy. 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  -PET from 02/26/19 showsknown liver metastasis and metastatic lymphadenopathy in chest and right axilla.  -[name] shows  MSI stable disease, Kras+ No targetable mutation, she is not a candidate for EGFR or immunotherapy. -Began palliative first line chemoon 03/12/2019, received 5FU/leuc with first 2 cycles forlarge open abdominal wound after surgery. She started full dose FOLFOX and avastin with cycle 2, s/p15cycles of treatment -Interim scans have been stable overall  -She developed CIPN after cycle 12, oxali dose reduced to 70 mg/m2 with cycle 13. Symptoms progressed and further reduced to 40 mg/m2 from cycle 14and 15 -She has progressive neuropathybutotherwise tolerating treatment well. No new symptoms or side effects.Oxaliplatin was held from cycle 16 (09/05/19) -continues maintenance 5FU/leuc and avastin -For convenience, the recommendation was to switch from 5-FU to oral chemo Xeloda twice daily x2 weeks on 1 week off and continue bevacizumab (q3 weeks on xeloda) -CT CAP 02/21/20 stable hepatic metastases, no new or progressive disease. Continue treatment.  2. Chemotherapy induced peripheral neuropathy (CIPN), G2 -secondary to Oxaliplatin, developed after cycle 12 FOLFOX -initially began in her fingertips, cycle 13 Oxali dose reduced to  70 mg/m2 -she has progressive neuropathy in fingertips and feet since cycle 14. She has moderate sensory deficit in her feet but still able to function and ambulate well. No pain or sensitivity  -Oxaliplatin furtherdose reducedto 40 mg/m2 cycle 14. -persistent for cycle 15, dose continued 40 mg/m2 and initiated gabapentin  -progressive,Oxaliplatin heldfrom cycle 16 -Titrating gabapentin(up to 100 mg AM and 300 mg qHS starting 10/30/19) -neuropathy improved off Oxali  3.Nutrition/Anorexia  -she reportedly weighed 253 lbs a few years ago, and weighed 203 lbs at symptom onset~11/2018 -190 lbsin 02/2019.  -Continue to f/u withdietician -she has been able to gain weight on chemo;continue monitoring  4. Iron deficiency anemia, and anemia secondary to cancer -She had low ferritinand low TIBC -IDA responding to ferrous sulfate BID.She can reduce to oral iron once daily for now -iron studies remain normal, continue BID and monitoring  5. HTN -Initiallyon hydralazine, isosorbide, lisinopril, metoprolol. -Avastin and anxiety contributing to BP elevation in clinic. Received clonidine PRN on chemo days.  -Added amlodipine and HCTZ with improvement. No longer on metoprolol. - Tolerating well without sx of hypotension  6. Social support -She worked in [employer] at 2 different jobs, but has been out of work lately due to her symptoms  -She is not getting disability, no pay, but her employer kept her on their insurance plan -has been referred to SW to discuss financial option/resources  7. Goals of care discussion -Goal is palliative, to control disease and prolong her life. -wehavediscussedher cancer is not curable at this stage, and that we will continue treatment as long as she can tolerate and her disease is controlled.she understands her treatment plan will evolve depending on her response and side effects of treatment.  -Full code  Disposition: Ms. Ginther  appears stable. She completed 2 cycles of maintenance Xeloda and bevacizumab. She tolerates treatment very well, no significant toxicities. She is able to continue normal functioning with good quality of life.  We reviewed her restaging CT CAP which shows stable hepatic metastases, no new or progressive disease. CEA is trending up however, we'll continue monitoring. If tumor marker continues to rise will restage in 2-3 months. Continue bevacizumab and Xeloda at current dose.  Labs reviewed, stable CBC. CMP shows Scr 1.18 otherwise stable. Possibly from recent CT contrast? I encouraged her to hydrate. She'll proceed with bevacizumab today as planned and start cycle 3 Xeloda 2000 mg BID on days 1-14 starting 11/30.  Follow-up in 3 weeks with next  cycle.  No problem-specific Assessment & Plan notes found for this encounter.   Orders Placed This Encounter  Procedures  . CEA (IN HOUSE-[facility])    Standing Status:   Standing    Number of Occurrences:   20    Standing Expiration Date:   03/03/2021   All questions were answered. The patient knows to call the clinic with any problems, questions or concerns. No barriers to learning were detected. Total encounter time was 30 minutes.      [name], NP 03/03/20

## 2020-03-03 ENCOUNTER — Inpatient Hospital Stay: Payer: BC Managed Care – PPO

## 2020-03-03 ENCOUNTER — Encounter: Payer: Self-pay | Admitting: Nurse Practitioner

## 2020-03-03 ENCOUNTER — Inpatient Hospital Stay (HOSPITAL_BASED_OUTPATIENT_CLINIC_OR_DEPARTMENT_OTHER): Payer: BC Managed Care – PPO | Admitting: Nurse Practitioner

## 2020-03-03 ENCOUNTER — Other Ambulatory Visit: Payer: Self-pay

## 2020-03-03 VITALS — BP 165/71 | HR 60 | Temp 97.8°F | Resp 18 | Ht 67.0 in | Wt 225.6 lb

## 2020-03-03 DIAGNOSIS — Z95828 Presence of other vascular implants and grafts: Secondary | ICD-10-CM

## 2020-03-03 DIAGNOSIS — C184 Malignant neoplasm of transverse colon: Secondary | ICD-10-CM | POA: Diagnosis not present

## 2020-03-03 DIAGNOSIS — D509 Iron deficiency anemia, unspecified: Secondary | ICD-10-CM | POA: Diagnosis not present

## 2020-03-03 DIAGNOSIS — C189 Malignant neoplasm of colon, unspecified: Secondary | ICD-10-CM

## 2020-03-03 DIAGNOSIS — D630 Anemia in neoplastic disease: Secondary | ICD-10-CM | POA: Diagnosis not present

## 2020-03-03 DIAGNOSIS — C787 Secondary malignant neoplasm of liver and intrahepatic bile duct: Secondary | ICD-10-CM

## 2020-03-03 DIAGNOSIS — Z5112 Encounter for antineoplastic immunotherapy: Secondary | ICD-10-CM | POA: Diagnosis not present

## 2020-03-03 DIAGNOSIS — C778 Secondary and unspecified malignant neoplasm of lymph nodes of multiple regions: Secondary | ICD-10-CM | POA: Diagnosis not present

## 2020-03-03 LAB — CMP (CANCER CENTER ONLY)
ALT: 14 U/L (ref 0–44)
AST: 21 U/L (ref 15–41)
Albumin: 3.8 g/dL (ref 3.5–5.0)
Alkaline Phosphatase: 95 U/L (ref 38–126)
Anion gap: 7 (ref 5–15)
BUN: 34 mg/dL — ABNORMAL HIGH (ref 6–20)
CO2: 18 mmol/L — ABNORMAL LOW (ref 22–32)
Calcium: 9.7 mg/dL (ref 8.9–10.3)
Chloride: 116 mmol/L — ABNORMAL HIGH (ref 98–111)
Creatinine: 1.18 mg/dL — ABNORMAL HIGH (ref 0.44–1.00)
GFR, Estimated: 53 mL/min — ABNORMAL LOW (ref >60)
Glucose, Bld: 83 mg/dL (ref 70–99)
Potassium: 4.6 mmol/L (ref 3.5–5.1)
Sodium: 141 mmol/L (ref 135–145)
Total Bilirubin: 0.3 mg/dL (ref 0.3–1.2)
Total Protein: 7.6 g/dL (ref 6.5–8.1)

## 2020-03-03 LAB — CBC WITH DIFFERENTIAL (CANCER CENTER ONLY)
Abs Immature Granulocytes: 0.03 10*3/uL (ref 0.00–0.07)
Basophils Absolute: 0.1 10*3/uL (ref 0.0–0.1)
Basophils Relative: 1 %
Eosinophils Absolute: 0.2 10*3/uL (ref 0.0–0.5)
Eosinophils Relative: 3 %
HCT: 33.9 % — ABNORMAL LOW (ref 36.0–46.0)
Hemoglobin: 10.7 g/dL — ABNORMAL LOW (ref 12.0–15.0)
Immature Granulocytes: 1 %
Lymphocytes Relative: 39 %
Lymphs Abs: 2.3 10*3/uL (ref 0.7–4.0)
MCH: 30.2 pg (ref 26.0–34.0)
MCHC: 31.6 g/dL (ref 30.0–36.0)
MCV: 95.8 fL (ref 80.0–100.0)
Monocytes Absolute: 0.8 10*3/uL (ref 0.1–1.0)
Monocytes Relative: 13 %
Neutro Abs: 2.5 10*3/uL (ref 1.7–7.7)
Neutrophils Relative %: 43 %
Platelet Count: 183 10*3/uL (ref 150–400)
RBC: 3.54 MIL/uL — ABNORMAL LOW (ref 3.87–5.11)
RDW: 18 % — ABNORMAL HIGH (ref 11.5–15.5)
WBC Count: 5.8 10*3/uL (ref 4.0–10.5)
nRBC: 0 % (ref 0.0–0.2)

## 2020-03-03 LAB — TOTAL PROTEIN, URINE DIPSTICK: Protein, ur: NEGATIVE mg/dL

## 2020-03-03 MED ORDER — SODIUM CHLORIDE 0.9% FLUSH
10.0000 mL | INTRAVENOUS | Status: DC | PRN
  Administered 2020-03-03: 10 mL
  Filled 2020-03-03: qty 10

## 2020-03-03 MED ORDER — CAPECITABINE 500 MG PO TABS: 1000.0000 mg/m2 | ORAL_TABLET | Freq: Two times a day (BID) | ORAL | 0 refills | Status: DC

## 2020-03-03 MED ORDER — HEPARIN SOD (PORK) LOCK FLUSH 100 UNIT/ML IV SOLN
500.0000 [IU] | Freq: Once | INTRAVENOUS | Status: AC | PRN
  Administered 2020-03-03: 500 [IU]
  Filled 2020-03-03: qty 5

## 2020-03-03 MED ORDER — SODIUM CHLORIDE 0.9 % IV SOLN
Freq: Once | INTRAVENOUS | Status: AC
  Filled 2020-03-03: qty 250

## 2020-03-03 MED ORDER — SODIUM CHLORIDE 0.9 % IV SOLN
7.5000 mg/kg | Freq: Once | INTRAVENOUS | Status: AC
  Administered 2020-03-03: 700 mg via INTRAVENOUS
  Filled 2020-03-03: qty 16

## 2020-03-03 NOTE — Patient Instructions (Signed)

## 2020-03-03 NOTE — Patient Instructions (Signed)
[facility] Discharge Instructions for Patients Receiving Chemotherapy  Today you received the following chemotherapy agents: bevacizumab  To help prevent nausea and vomiting after your treatment, we encourage you to take your nausea medication as directed.   If you develop nausea and vomiting that is not controlled by your nausea medication, call the clinic.   BELOW ARE SYMPTOMS THAT SHOULD BE REPORTED IMMEDIATELY:  *FEVER GREATER THAN 100.5 F  *CHILLS WITH OR WITHOUT FEVER  NAUSEA AND VOMITING THAT IS NOT CONTROLLED WITH YOUR NAUSEA MEDICATION  *UNUSUAL SHORTNESS OF BREATH  *UNUSUAL BRUISING OR BLEEDING  TENDERNESS IN MOUTH AND THROAT WITH OR WITHOUT PRESENCE OF ULCERS  *URINARY PROBLEMS  *BOWEL PROBLEMS  UNUSUAL RASH Items with * indicate a potential emergency and should be followed up as soon as possible.  Feel free to call the clinic should you have any questions or concerns. The clinic phone number is [phone].  Please show the CHEMO ALERT CARD at check-in to the Emergency Department and triage nurse.   

## 2020-03-03 NOTE — Progress Notes (Signed)
Per Regan Rakers, NP okay to proceed with treatment today with BP 165/71.

## 2020-03-04 ENCOUNTER — Telehealth: Payer: Self-pay | Admitting: Nurse Practitioner

## 2020-03-04 NOTE — Telephone Encounter (Signed)
Scheduled appointments per 11/29 los. Spoke to patient who is aware of appointments dates and times.

## 2020-03-10 ENCOUNTER — Other Ambulatory Visit: Payer: Self-pay | Admitting: Nurse Practitioner

## 2020-03-10 DIAGNOSIS — C189 Malignant neoplasm of colon, unspecified: Secondary | ICD-10-CM

## 2020-03-12 ENCOUNTER — Other Ambulatory Visit: Payer: Self-pay | Admitting: Hematology

## 2020-03-12 DIAGNOSIS — C189 Malignant neoplasm of colon, unspecified: Secondary | ICD-10-CM

## 2020-03-12 DIAGNOSIS — C787 Secondary malignant neoplasm of liver and intrahepatic bile duct: Secondary | ICD-10-CM

## 2020-03-12 MED ORDER — CAPECITABINE 500 MG PO TABS: 1000.0000 mg/m2 | ORAL_TABLET | Freq: Two times a day (BID) | ORAL | 2 refills | Status: DC

## 2020-03-14 DIAGNOSIS — Z933 Colostomy status: Secondary | ICD-10-CM | POA: Diagnosis not present

## 2020-03-14 DIAGNOSIS — K56609 Unspecified intestinal obstruction, unspecified as to partial versus complete obstruction: Secondary | ICD-10-CM | POA: Diagnosis not present

## 2020-03-17 IMAGING — CT NM PET TUM IMG INITIAL (PI) SKULL BASE T - THIGH
7 series · 25 of 25 positions shown · non-contrast
Comparison: CT abdomen pelvis 01/23/2019.

CLINICAL DATA: Initial treatment strategy for colorectal cancer.

EXAM:
NUCLEAR MEDICINE PET SKULL BASE TO THIGH
TECHNIQUE: 9.7 mCi F-18 FDG was injected intravenously. Full-ring PET imaging
was performed from the skull base to thigh after the radiotracer. CT
data was obtained and used for attenuation correction and anatomic
localization.
Fasting blood glucose: 90 mg/dl

[Series 3: pet sk_thigh ac · axial · 5.0mm · 4.07mm/px · z∈[-795,+49]mm · 6 of 212 slices shown]
[im 1/212]
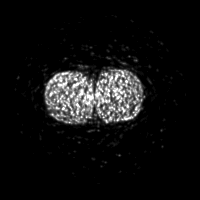
[im 43/212]
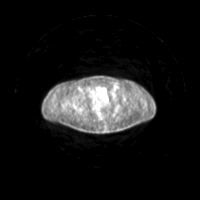
[im 85/212]
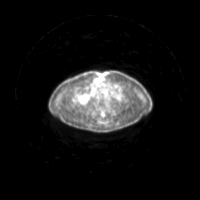
[im 127/212]
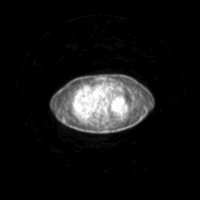
[im 169/212]
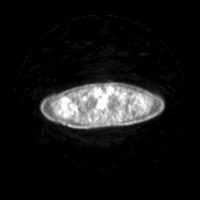
[im 212/212]
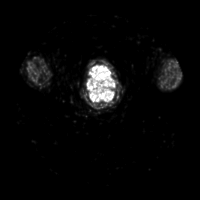

[Series 4: ct sk_thigh 5.0 b31f · axial · 5.0mm · 0.98mm/px · z∈[-795,+49]mm · 5 of 212 slices shown]
[im 1/212]
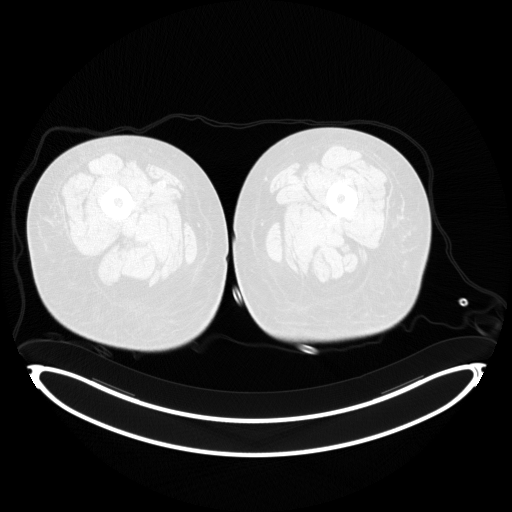
[im 53/212]
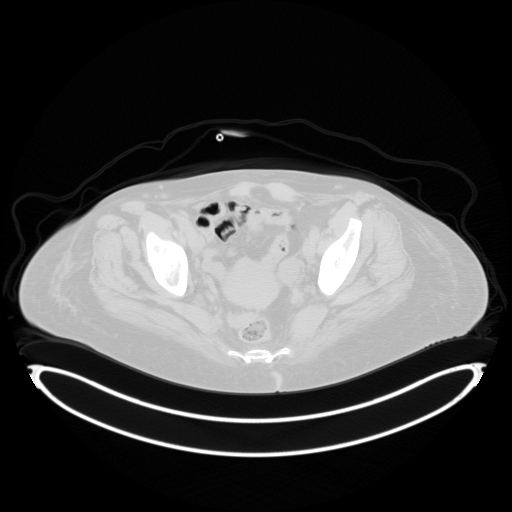
[im 106/212]
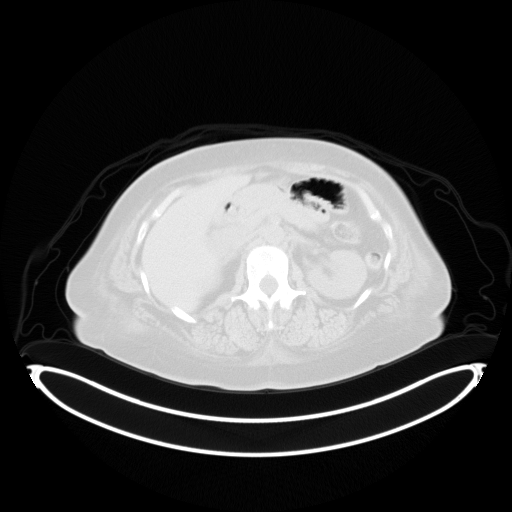
[im 159/212]
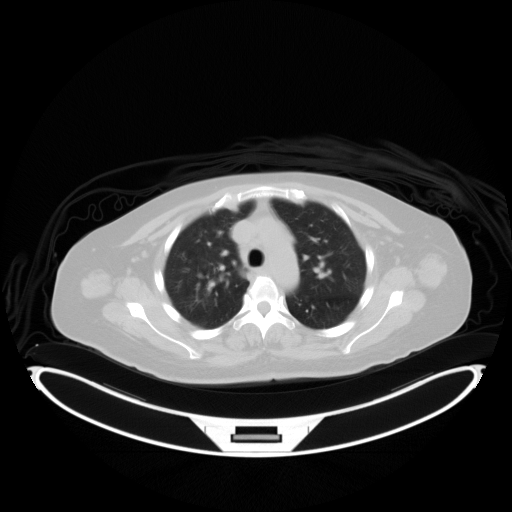
[im 212/212]
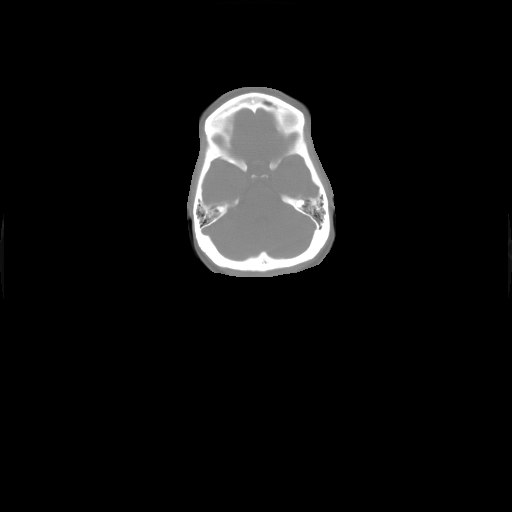

[Series 5: pet sk_thigh nac · axial · 5.0mm · 4.07mm/px · z∈[-795,+49]mm · 5 of 212 slices shown]
[im 1/212]
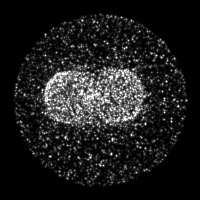
[im 53/212]
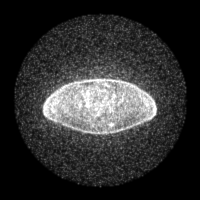
[im 106/212]
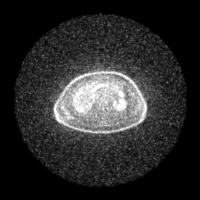
[im 159/212]
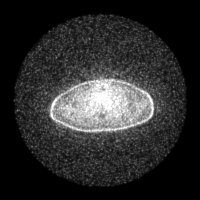
[im 212/212]
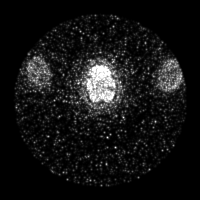

[Series 8: ct sk_thigh 5.0 b70f lung_bone · axial · 5.0mm · 0.55mm/px · z∈[-310,-74]mm · 2 of 60 slices shown]
[im 1/60  bone]
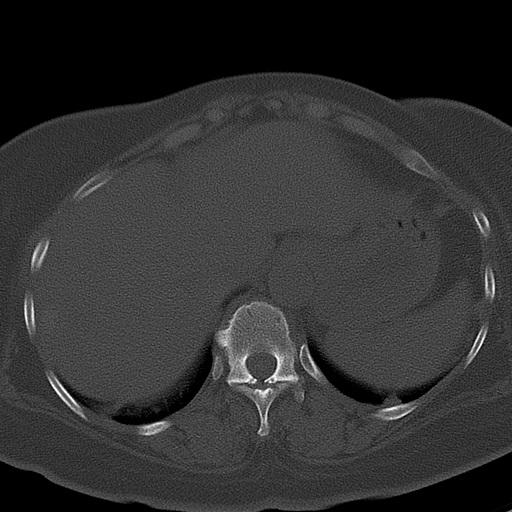
[im 60/60  bone]
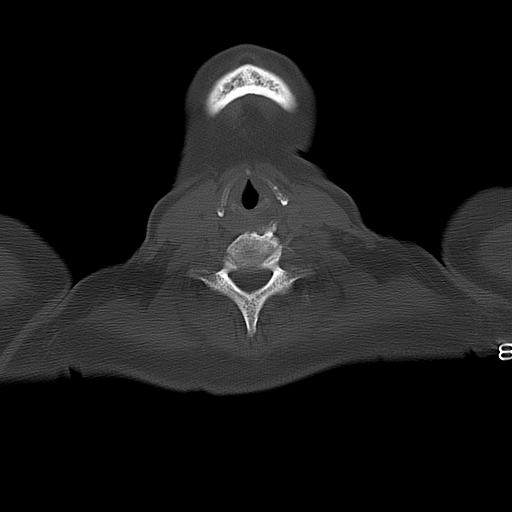

[Series 603: range-ct sk_thigh 5.0 (id)<alpha range> · 1 of 59 slices shown (1 of 2)]
[im 1/59]
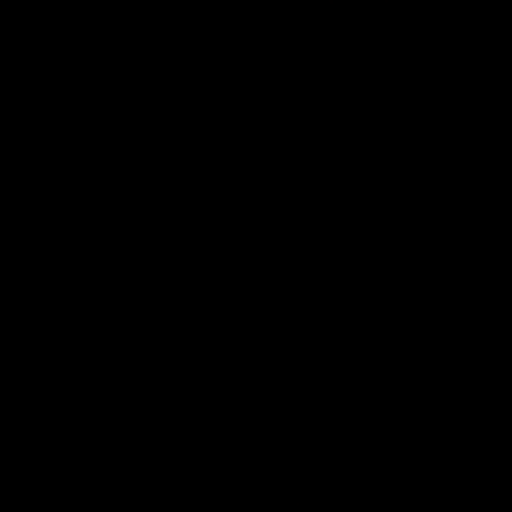

[Series 604: mip range 2 · coronal · 1.75mm/px · 1 of 32 slices shown]
[im 1/32]
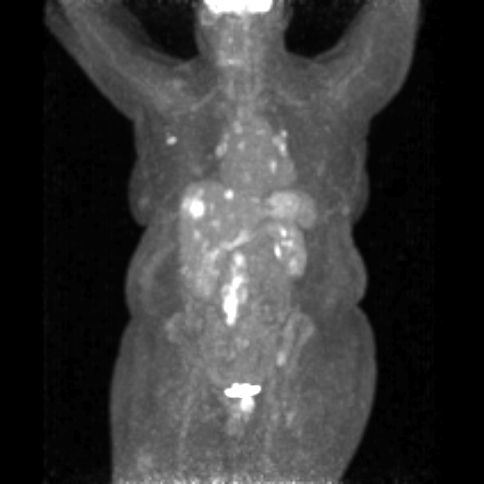

[Series 605: range-ct sk_thigh 5.0 (id)<alpha range> · 5 of 205 slices shown (2 of 2)]
[im 1/205]
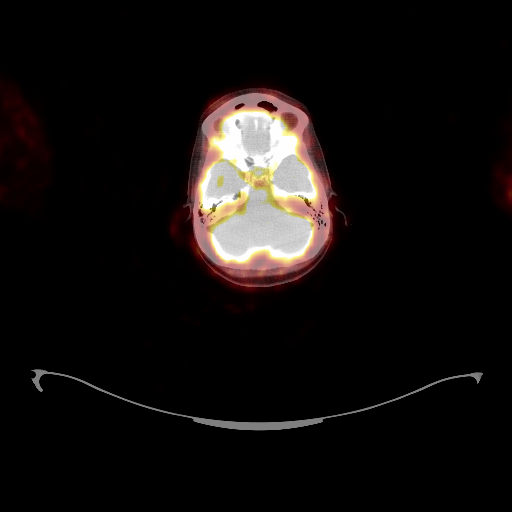
[im 52/205]
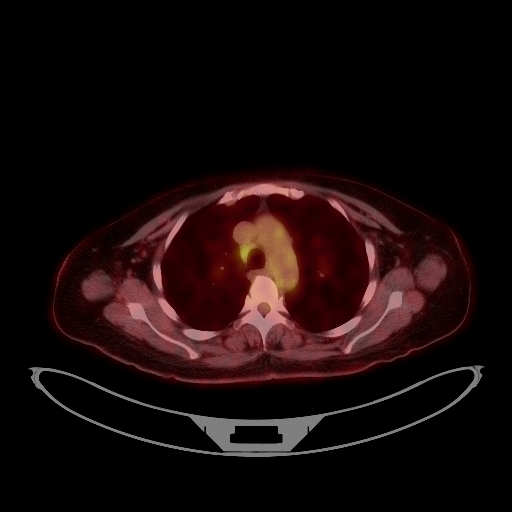
[im 103/205]
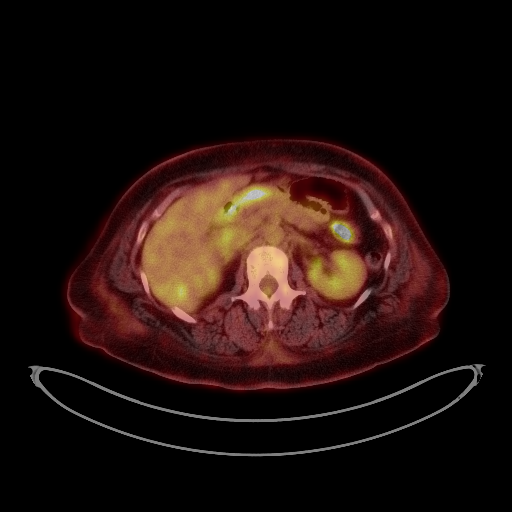
[im 154/205]
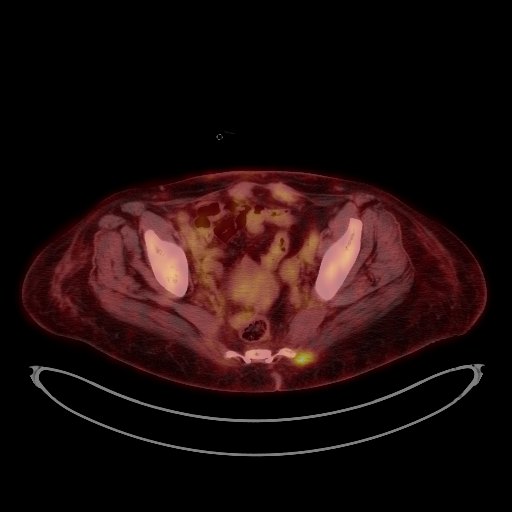
[im 205/205]
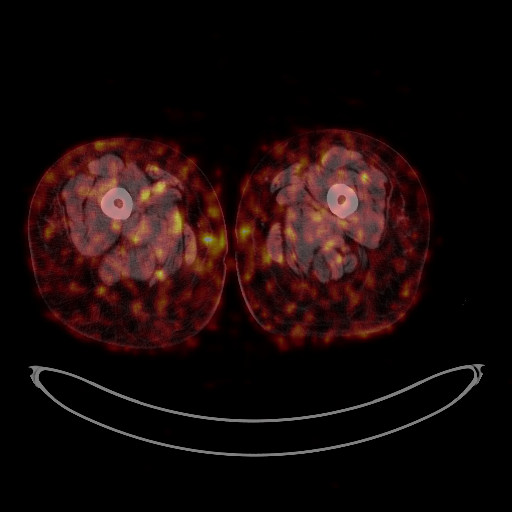

[25 of 25 positions shown; findings below may reference images not displayed]

FINDINGS: Mediastinal blood pool activity: SUV max

Liver activity: SUV max NA

NECK: No abnormal hypermetabolism.

Incidental CT findings: None.

CHEST: There are hypermetabolic lymph nodes in the mediastinum, both
hilar regions and both axillary regions. Index low right
paratracheal lymph node measures 7 mm (4/55) with an SUV max of 4.8.
Index left hilar hypermetabolism, SUV max 7.3. Index right axillary
lymph node measures 8 mm (4/62) with an SUV max 6.4. Left periaortic
lymph node measures 6 mm (4/75) has an SUV max of 7.1. No
hypermetabolic pulmonary nodules.

Incidental CT findings: Atherosclerotic calcification of the aorta
and coronary arteries. Heart is enlarged. No pericardial effusion.

ABDOMEN/PELVIS: Focal hypermetabolism in the periphery of the right
hepatic lobe has an SUV max 12.8 and corresponds to heterogeneous
lesions in the same location on 01/23/2019. A second index hyper
metabolic lesion along the dome of the liver corresponds to minimal
calcification on CT ([DATE]), with an SUV max 10.8, and may be within
the left hepatic lobe. Additional areas of milder focal
hypermetabolism are seen in the right hepatic lobe. No definite
abnormal hypermetabolism in the adrenal glands, spleen or pancreas.
Focal hypermetabolism medial to the right adrenal gland has an SUV
max of 3.7 and likely corresponds to a hyperdense lymph node in the
same location on 01/23/2019, not well seen on the current study.
Hypermetabolism is seen along the ventral abdominal midline,
associated with postoperative changes. Intense hypermetabolism is
associated with the rectum, SUV max 14.1.

Incidental CT findings: Liver, gallbladder, adrenal glands, kidneys,
spleen, pancreas, stomach and small bowel are otherwise
unremarkable. Right lower quadrant colostomy.

SKELETON: No abnormal osseous hypermetabolism.

Incidental CT findings: None.
IMPRESSION: 1. Hypermetabolic metastatic disease in the liver and
mediastinal/hilar/axillary lymph nodes.
2. Focal hypermetabolism in the rectum. Continued attention on
follow-up exams is warranted.
3. Focal hypermetabolism medial to the right adrenal gland may be
within a metastatic lymph node, better visualized on 01/23/2019.
4. Aortic atherosclerosis (INQ6Q-170.0). Coronary artery
calcification.

## 2020-03-22 ENCOUNTER — Other Ambulatory Visit: Payer: Self-pay | Admitting: Nurse Practitioner

## 2020-03-23 NOTE — Progress Notes (Signed)
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 Bowel-gas pattern consistent with small bowel obstruction. No free air. 2. No acute chest findings.   01/23/2019 Imaging   CT AP IMPRESSION: Obstructing mid transverse colonic mass with mild regional adenopathy and hepatic metastatic disease. The mass likely extends through the serosa; no ascites or peritoneal nodularity.   01/24/2019 Surgery   Surgeon: Clovis Riley MD Assistant: [name] Procedure performed: Transverse colectomy with end colostomy, liver biopsy Procedure classification: URGENT/EMERGENT Preop diagnosis: Obstructing, metastatic transverse colon mass Post-op diagnosis/intraop findings: Same   01/24/2019 Pathology Results   FINAL MICROSCOPIC DIAGNOSIS:   A. COLON, TRANSVERSE, RESECTION:  Colonic adenocarcinoma, 5 cm.  Carcinoma extends into pericolonic connective tissue and focally to  serosal surface.  Margins not involved.  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   B. LIVER NODULE, LEFT, BIOPSY:  Metastatic adenocarcinoma.    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 Hypermetabolic metastatic disease in the liver and mediastinal/hilar/axillary lymph nodes. 2. Focal hypermetabolism in the rectum. Continued attention on follow-up exams is warranted. 3. Focal hypermetabolism medial to the right adrenal gland may be within a metastatic lymph node, better visualized on 01/23/2019. 4. Aortic atherosclerosis (ICD10-170.0). Coronary artery calcification.   03/12/2019 -  Chemotherapy   She started 5FU q2weeks on 03/12/19 for 2 cycles. She started full dose FOLFOX with Avastin on 04/09/19. Oxaliplatin dose reduced repeatedly due to neuropathy C12 and held since C16 on 10/09/19. Now on maintenance Avastin and 5FU q2weeks since 10/09/19       -Maintenance change to maintenance xeloda 2000 mg BID days 1-14 q21 days and q3 weeks Zirabev (15 mg/kg) starting 01/21/20. First cycle was taken 1053m BID due to misunderstanding.    05/31/2019 Imaging   Restaging CT CAP IMPRESSION: 1. Similar to mild interval decrease in size of multiple hepatic lesions, partially calcified. 2. 2 mm right upper lobe pulmonary nodule. Recommend attention on follow-up. 3. Emphysema and aortic atherosclerosis.   08/23/2019 Imaging   CT CAP w contrast  IMPRESSION: 1. The dominant peripheral right liver metastasis has mildly increased. Other smaller liver metastases are stable. 2. Otherwise no new or progressive metastatic disease in the chest, abdomen or pelvis. 3. Aortic Atherosclerosis (ICD10-I70.0) and Emphysema (ICD10-J43.9).   11/27/2019 Imaging   CT CAP w contrast  IMPRESSION: Status post transverse colectomy with right mid abdominal colostomy.   Mildly progressive hepatic metastases, as above.   No evidence of metastatic disease in the chest. Small  mediastinal lymph nodes  are within normal limits.   Additional stable ancillary findings as above.   02/21/2020 Imaging   IMPRESSION: 1. Stable hepatic metastatic disease. 2. Aortic atherosclerosis (ICD10-I70.0). Coronary artery calcification. 3.  Emphysema (ICD10-J43.9).     CURRENT THERAPY: Maintenance Avastin and 5FU q2weeks since 10/09/19, change to maintenance xeloda 2000 mg BID days 1-14 q21 days and q3 weeks avastin (15 mg/kg) starting 01/21/20. First cycle was taken 1084m BID due to misunderstanding.  INTERVAL HISTORY: Ms. SKerchevalreturns for follow up and treatment. She received cycle 3 of Bevacizumab on 03/03/20 and continues Xeloda as prescribed, will start cycle 4 tomorrow.  Her hands are darker and numb, she is more aware of neuropathy but remains able to function normally.  Left foot also numb and dry, no skin breakdown.  She has no other side effects from treatment.  Energy and appetite are good.  No nausea/vomiting, diarrhea, mucositis, fever, chills, cough, chest pain, dyspnea or other new concerns.  Denies any pain.   MEDICAL HISTORY:  Past Medical History:  Diagnosis Date  . Anemia    low iron  . Cancer ([facility]    colon cancer  . Colon cancer ([place] 01/[place]  . Hypertension 01/23/2019  . Personal history of chemotherapy 01/2019   colon CA  . [place] 01/23/[place]    SURGICAL HISTORY: Past Surgical History:  Procedure Laterality Date  . CESAREAN SECTION     x2  . COLOSTOMY N/A 01/24/2019   Procedure: End Loop Colostomy;  Surgeon: CClovis Riley MD;  [place] [facility]  Service: General;  Laterality: N/A;  . PARTIAL COLECTOMY N/A 01/24/2019   Procedure: PARTIAL COLECTOMY;  Surgeon: CClovis Riley MD;  [place] [facility]  Service: General;  Laterality: N/A;  . PORTACATH PLACEMENT Right 02/28/2019   Procedure: INSERTION PORT-A-CATH WITH ULTRASOUND GUIDANCE;  Surgeon: CClovis Riley MD;  [facility] [facility]  Service: General;  Laterality: Right;    I have  reviewed the social history and family history with the patient and they are unchanged from previous note.  ALLERGIES:  has No Known Allergies.  MEDICATIONS:  Current Outpatient Medications  Medication Sig Dispense Refill  . acetaminophen (TYLENOL) 325 MG tablet Take 2 tablets (650 mg total) by mouth every 6 (six) hours as needed.    .Marland KitchenamLODipine (NORVASC) 10 MG tablet Take 1 tablet (10 mg total) by mouth daily. 90 tablet 1  . capecitabine (XELODA) 500 MG tablet Take 4 tablets (2,000 mg total) by mouth 2 (two) times daily after a meal. Take for 14 days on, 7 days off, repeat every 21 days. 112 tablet 2  . ferrous sulfate 325 (65 FE) MG tablet TAKE 1 TABLET (325 MG TOTAL) BY MOUTH 2 (TWO) TIMES DAILY WITH A MEAL. 180 tablet 1  . gabapentin (NEURONTIN) 300 MG capsule Take 1 capsule (300 mg total) by mouth 2 (two) times daily. 60 capsule 3  . hydrALAZINE (APRESOLINE) 25 MG tablet TAKE 1 TABLET (25 MG TOTAL) BY MOUTH EVERY 8 (EIGHT) HOURS. 90 tablet 0  . hydrochlorothiazide (HYDRODIURIL) 12.5 MG tablet TAKE 1 TABLET BY MOUTH EVERY DAY 30 tablet 1  . hydrocortisone (ANUSOL-HC) 2.5 % rectal cream Place 1 application rectally 2 (two) times daily. 30 g 0  . Ibuprofen (ADVIL) 200 MG CAPS Take 400 mg by mouth daily as needed (pain).    . isosorbide mononitrate (IMDUR) 30 MG 24 hr tablet Take 1 tablet (30 mg total) by mouth daily. 90 tablet  0  . lidocaine (LMX) 4 % cream Apply 1 application topically 3 (three) times daily as needed. 30 g 0  . lisinopril (ZESTRIL) 40 MG tablet TAKE 1 TABLET BY MOUTH EVERY DAY 90 tablet 1  . loperamide (IMODIUM) 2 MG capsule TAKE 1 CAPSULE (2 MG TOTAL) BY MOUTH AS NEEDED FOR DIARRHEA OR LOOSE STOOLS. 90 capsule 0  . metoprolol tartrate (LOPRESSOR) 50 MG tablet TAKE 1 TABLET BY MOUTH TWICE A DAY 180 tablet 1  . metroNIDAZOLE (FLAGYL) 250 MG tablet Take 1 tablet (250 mg total) by mouth 3 (three) times daily. 1 tablet 0  . ondansetron (ZOFRAN) 4 MG tablet Take 1 tablet (4 mg  total) by mouth every 6 (six) hours as needed for nausea. 30 tablet 0  . oxyCODONE-acetaminophen (PERCOCET/ROXICET) 5-325 MG tablet Take 1-2 tablets by mouth every 6 (six) hours as needed for severe pain. 20 tablet 0  . polycarbophil (FIBERCON) 625 MG tablet Take 1 tablet (625 mg total) by mouth daily. 30 tablet 0   No current facility-administered medications for this visit.    PHYSICAL EXAMINATION: ECOG PERFORMANCE STATUS: 1 - Symptomatic but completely ambulatory  Vitals:   03/24/20 1034  BP: (!) 154/66  Pulse: (!) 45  Resp: 14  Temp: (!) 97.3 F (36.3 C)  SpO2: 100%   Filed Weights   03/24/20 1034  Weight: 228 lb (103.4 kg)    GENERAL:alert, no distress and comfortable SKIN: Palms with marked hyperpigmentation, no dryness or cracks EYES:  sclera clear LUNGS:  normal breathing effort HEART: Left ankle edema NEURO: alert & oriented x 3 with fluent speech, no focal motor deficits.  Intact peripheral vibratory sense over the fingertips per tuning fork exam  PAC without erythema  LABORATORY DATA:  I have reviewed the data as listed CBC Latest Ref Rng & Units 03/24/2020 03/03/2020 02/11/2020  WBC 4.0 - 10.5 K/uL 6.8 5.8 6.4  Hemoglobin 12.0 - 15.0 g/dL 10.5(L) 10.7(L) 11.3(L)  Hematocrit 36.0 - 46.0 % 32.2(L) 33.9(L) 35.7(L)  Platelets 150 - 400 K/uL 169 183 162     CMP Latest Ref Rng & Units 03/24/2020 03/03/2020 02/11/2020  Glucose 70 - 99 mg/dL 96 83 86  BUN 6 - 20 mg/dL 27(H) 34(H) 24(H)  Creatinine 0.44 - 1.00 mg/dL 1.11(H) 1.18(H) 1.06(H)  Sodium 135 - 145 mmol/L 137 141 138  Potassium 3.5 - 5.1 mmol/L 4.4 4.6 4.7  Chloride 98 - 111 mmol/L 111 116(H) 111  CO2 22 - 32 mmol/L 20(L) 18(L) 20(L)  Calcium 8.9 - 10.3 mg/dL 9.3 9.7 10.0  Total Protein 6.5 - 8.1 g/dL 7.5 7.6 7.6  Total Bilirubin 0.3 - 1.2 mg/dL 0.5 0.3 0.3  Alkaline Phos 38 - 126 U/L 94 95 105  AST 15 - 41 U/L _0 ALT 0 - 44 U/L 15 14 33      RADIOGRAPHIC STUDIES: I have personally  reviewed the radiological images as listed and agreed with the findings in the report. No results found.   ASSESSMENT & PLAN: JENNISE BOTH a [id number].o.femalewith   1. Adenocarcinoma of transverse colon, moderately differentiated, pT4aN1aM1a stage IV with liverand nodalmetastasis, MMR normal  -Diagnosed in 01/2019 afteremergent colectomy and liver biopsy. 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  -PET from 02/26/19 showsknown liver metastasis and metastatic lymphadenopathy in chest and right axilla.  -[name] shows MSI stable disease, Kras+ No targetable mutation, she is not a candidate for EGFR or immunotherapy. -Began palliative first line chemoon  03/12/2019, received 5FU/leuc with first 2 cycles forlarge open abdominal wound after surgery. She started full dose FOLFOX and avastin with cycle 2, s/p15cycles of treatment -Interim scans have been stable overall  -She developed CIPN after cycle 12, oxali dose reduced to 70 mg/m2 with cycle 13. Symptoms progressed and further reduced to 40 mg/m2 from cycle 14and 15 -She has progressive neuropathybutotherwise tolerating treatment well. No new symptoms or side effects.Oxaliplatin was held from cycle 16(09/05/19) -continues maintenance 5FU/leuc and avastin -For convenience, the recommendation was to switch from 5-FU to oral chemo Xeloda twice daily x2 weeks on 1 week off and continue bevacizumab(q3 weeks on xeloda) -CT CAP 02/21/20 stable hepatic metastases, no new or progressive disease. Continue treatment.  2. 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 She has moderate sensory deficit in her feet but still able to function and ambulate well. No pain or sensitivity  -Oxaliplatin  furtherdose reducedto 40 mg/m2 cycle 14. -persistent for cycle 15, dose continued 40 mg/m2 and initiated gabapentin  -progressive,Oxaliplatin heldfrom cycle 16 -Titrating gabapentin(up to 100 mg AM and 300 mg qHS starting 10/30/19) -neuropathy improved off Oxali, taking 200 mg BID on 03/24/20. Neuropathy slightly worsened after last cycle Xeloda, increase to 300 mg BID  3.Nutrition/Anorexia  -she reportedly weighed 253 lbs a few years ago, and weighed 203 lbs at symptom onset~11/2018 -190 lbsin 02/2019.  -Continue to f/u withdietician -she has been able to gain weight on chemo;continue monitoring  4. Iron deficiency anemia, and anemia secondary to cancer -She had low ferritinand low TIBC -IDA responding to ferrous sulfate BID.She can reduce to oral iron once daily for now -iron studies remain normal, continue BID and monitoring  5. HTN -Initiallyon hydralazine, isosorbide, lisinopril, metoprolol. -Avastin and anxiety contributing to BP elevation in clinic. Received clonidine PRN on chemo days.  -Added amlodipine and HCTZ with improvement. No longer on metoprolol. -Tolerating well without sx of hypotension  6. Social support -She worked in [employer] at 2 different jobs, but has been out of work lately due to her symptoms  -She is not getting disability, no pay, but her employer kept her on their insurance plan -has been referred to SW to discuss financial option/resources  7. Goals of care discussion -Goal is palliative, to control disease and prolong her life. -wehavediscussedher cancer is not curable at this stage, and that we will continue treatment as long as she can tolerate and her disease is controlled.she understands her treatment plan will evolve depending on her response and side effects of treatment.  -Full code  Disposition:  Ms. Edgar appears stable.  She completed 3 cycles of maintenance Avastin and Xeloda 2000 mg twice daily for 2  weeks on and 1 week off.  She continues to tolerate treatment well but has progressive skin toxicity and neuropathy.  No sensory deficits.  She is able to function well.  I recommend to increase gabapentin 300 mg twice daily, if she has persistent symptoms she can reduce Xeloda to 2000 mg a.m. and 1500 mg p.m. for the cycle.  CBC and CMP adequate to proceed with cycle 4 today as planned. CEA continues trending up without other clinical evidence of disease progression. Will continue monitoring. .   Follow-up and cycle 5 in 3 weeks.  All questions were answered. The patient knows to call the  clinic with any problems, questions or concerns. No barriers to learning were detected. Total encounter time was 30 minutes.      [name], NP 03/24/20

## 2020-03-24 ENCOUNTER — Telehealth: Payer: Self-pay | Admitting: Nurse Practitioner

## 2020-03-24 ENCOUNTER — Other Ambulatory Visit: Payer: Self-pay

## 2020-03-24 ENCOUNTER — Inpatient Hospital Stay: Payer: BC Managed Care – PPO

## 2020-03-24 ENCOUNTER — Encounter: Payer: Self-pay | Admitting: Nurse Practitioner

## 2020-03-24 ENCOUNTER — Inpatient Hospital Stay (HOSPITAL_BASED_OUTPATIENT_CLINIC_OR_DEPARTMENT_OTHER): Payer: BC Managed Care – PPO | Admitting: Nurse Practitioner

## 2020-03-24 ENCOUNTER — Inpatient Hospital Stay: Payer: BC Managed Care – PPO | Attending: Nurse Practitioner

## 2020-03-24 VITALS — BP 154/66 | HR 45 | Temp 97.3°F | Resp 14 | Ht 67.0 in | Wt 228.0 lb

## 2020-03-24 DIAGNOSIS — C787 Secondary malignant neoplasm of liver and intrahepatic bile duct: Secondary | ICD-10-CM

## 2020-03-24 DIAGNOSIS — C184 Malignant neoplasm of transverse colon: Secondary | ICD-10-CM | POA: Insufficient documentation

## 2020-03-24 DIAGNOSIS — C778 Secondary and unspecified malignant neoplasm of lymph nodes of multiple regions: Secondary | ICD-10-CM | POA: Insufficient documentation

## 2020-03-24 DIAGNOSIS — C189 Malignant neoplasm of colon, unspecified: Secondary | ICD-10-CM

## 2020-03-24 DIAGNOSIS — Z5112 Encounter for antineoplastic immunotherapy: Secondary | ICD-10-CM | POA: Diagnosis not present

## 2020-03-24 LAB — CMP (CANCER CENTER ONLY)
ALT: 15 U/L (ref 0–44)
AST: 19 U/L (ref 15–41)
Albumin: 3.7 g/dL (ref 3.5–5.0)
Alkaline Phosphatase: 94 U/L (ref 38–126)
Anion gap: 6 (ref 5–15)
BUN: 27 mg/dL — ABNORMAL HIGH (ref 6–20)
CO2: 20 mmol/L — ABNORMAL LOW (ref 22–32)
Calcium: 9.3 mg/dL (ref 8.9–10.3)
Chloride: 111 mmol/L (ref 98–111)
Creatinine: 1.11 mg/dL — ABNORMAL HIGH (ref 0.44–1.00)
GFR, Estimated: 57 mL/min — ABNORMAL LOW (ref >60)
Glucose, Bld: 96 mg/dL (ref 70–99)
Potassium: 4.4 mmol/L (ref 3.5–5.1)
Sodium: 137 mmol/L (ref 135–145)
Total Bilirubin: 0.5 mg/dL (ref 0.3–1.2)
Total Protein: 7.5 g/dL (ref 6.5–8.1)

## 2020-03-24 LAB — CBC WITH DIFFERENTIAL (CANCER CENTER ONLY)
Abs Immature Granulocytes: 0.03 10*3/uL (ref 0.00–0.07)
Basophils Absolute: 0.1 10*3/uL (ref 0.0–0.1)
Basophils Relative: 1 %
Eosinophils Absolute: 0.2 10*3/uL (ref 0.0–0.5)
Eosinophils Relative: 2 %
HCT: 32.2 % — ABNORMAL LOW (ref 36.0–46.0)
Hemoglobin: 10.5 g/dL — ABNORMAL LOW (ref 12.0–15.0)
Immature Granulocytes: 0 %
Lymphocytes Relative: 34 %
Lymphs Abs: 2.3 10*3/uL (ref 0.7–4.0)
MCH: 31.1 pg (ref 26.0–34.0)
MCHC: 32.6 g/dL (ref 30.0–36.0)
MCV: 95.3 fL (ref 80.0–100.0)
Monocytes Absolute: 0.7 10*3/uL (ref 0.1–1.0)
Monocytes Relative: 10 %
Neutro Abs: 3.6 10*3/uL (ref 1.7–7.7)
Neutrophils Relative %: 53 %
Platelet Count: 169 10*3/uL (ref 150–400)
RBC: 3.38 MIL/uL — ABNORMAL LOW (ref 3.87–5.11)
RDW: 18.6 % — ABNORMAL HIGH (ref 11.5–15.5)
WBC Count: 6.8 10*3/uL (ref 4.0–10.5)
nRBC: 0 % (ref 0.0–0.2)

## 2020-03-24 LAB — TOTAL PROTEIN, URINE DIPSTICK: Protein, ur: NEGATIVE mg/dL

## 2020-03-24 LAB — CEA (IN HOUSE-CHCC): CEA (CHCC-In House): 187.73 ng/mL — ABNORMAL HIGH (ref 0.00–5.00)

## 2020-03-24 MED ORDER — SODIUM CHLORIDE 0.9% FLUSH
10.0000 mL | INTRAVENOUS | Status: DC | PRN
  Administered 2020-03-24: 12:00:00 10 mL
  Filled 2020-03-24: qty 10

## 2020-03-24 MED ORDER — SODIUM CHLORIDE 0.9 % IV SOLN
Freq: Once | INTRAVENOUS | Status: AC
  Filled 2020-03-24: qty 250

## 2020-03-24 MED ORDER — GABAPENTIN 300 MG PO CAPS
300.0000 mg | ORAL_CAPSULE | Freq: Two times a day (BID) | ORAL | 3 refills | Status: DC
Start: 2020-03-24 — End: 2020-08-27

## 2020-03-24 MED ORDER — HEPARIN SOD (PORK) LOCK FLUSH 100 UNIT/ML IV SOLN
500.0000 [IU] | Freq: Once | INTRAVENOUS | Status: AC | PRN
  Administered 2020-03-24: 12:00:00 500 [IU]
  Filled 2020-03-24: qty 5

## 2020-03-24 MED ORDER — SODIUM CHLORIDE 0.9 % IV SOLN
7.5000 mg/kg | Freq: Once | INTRAVENOUS | Status: AC
  Administered 2020-03-24: 12:00:00 700 mg via INTRAVENOUS
  Filled 2020-03-24: qty 16

## 2020-03-24 NOTE — Patient Instructions (Signed)
[facility] Discharge Instructions for Patients Receiving Chemotherapy  Today you received the following chemotherapy agents: bevacizumab  To help prevent nausea and vomiting after your treatment, we encourage you to take your nausea medication as directed.   If you develop nausea and vomiting that is not controlled by your nausea medication, call the clinic.   BELOW ARE SYMPTOMS THAT SHOULD BE REPORTED IMMEDIATELY:  *FEVER GREATER THAN 100.5 F  *CHILLS WITH OR WITHOUT FEVER  NAUSEA AND VOMITING THAT IS NOT CONTROLLED WITH YOUR NAUSEA MEDICATION  *UNUSUAL SHORTNESS OF BREATH  *UNUSUAL BRUISING OR BLEEDING  TENDERNESS IN MOUTH AND THROAT WITH OR WITHOUT PRESENCE OF ULCERS  *URINARY PROBLEMS  *BOWEL PROBLEMS  UNUSUAL RASH Items with * indicate a potential emergency and should be followed up as soon as possible.  Feel free to call the clinic should you have any questions or concerns. The clinic phone number is [phone].  Please show the CHEMO ALERT CARD at check-in to the Emergency Department and triage nurse.   

## 2020-03-24 NOTE — Telephone Encounter (Signed)
Scheduled appointments per 12/20 los. Spoke to patient who is aware of appointments dates and times.

## 2020-03-24 NOTE — Patient Instructions (Signed)

## 2020-04-11 NOTE — Progress Notes (Signed)
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 Bowel-gas pattern consistent with small bowel obstruction. No free air. 2. No acute chest findings.   01/23/2019 Imaging   CT AP IMPRESSION: Obstructing mid transverse colonic mass with mild regional adenopathy and hepatic metastatic disease. The mass likely extends through the serosa; no ascites or peritoneal nodularity.   01/24/2019 Surgery   Surgeon: Casey Riley MD Assistant: [name] Procedure performed: Transverse colectomy with end colostomy, liver biopsy Procedure classification: URGENT/EMERGENT Preop diagnosis: Obstructing, metastatic transverse colon mass Post-op diagnosis/intraop findings: Same   01/24/2019 Pathology Results   FINAL MICROSCOPIC DIAGNOSIS:   A. COLON, TRANSVERSE, RESECTION:  Colonic adenocarcinoma, 5 cm.  Carcinoma extends into pericolonic connective tissue and focally to  serosal surface.  Margins not involved.  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   B. LIVER NODULE, LEFT, BIOPSY:  Metastatic adenocarcinoma.    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 Hypermetabolic metastatic disease in the liver and mediastinal/hilar/axillary lymph nodes. 2. Focal hypermetabolism in the rectum. Continued attention on follow-up exams is warranted. 3. Focal hypermetabolism medial to the right adrenal gland may be within a metastatic lymph node, better visualized on 01/23/2019. 4. Aortic atherosclerosis (ICD10-170.0). Coronary artery calcification.   03/12/2019 -  Chemotherapy   She started 5FU q2weeks on 03/12/19 for 2 cycles. She started full dose FOLFOX with Avastin on 04/09/19. Oxaliplatin dose reduced repeatedly due to neuropathy C12 and held since C16 on 10/09/19. Now on maintenance Avastin and 5FU q2weeks since 10/09/19       -Maintenance change to maintenance xeloda 2000 mg BID days 1-14 q21 days and q3 weeks Zirabev (15 mg/kg) starting 01/21/20. First cycle was taken 1070m BID due to misunderstanding.    05/31/2019 Imaging   Restaging CT CAP IMPRESSION: 1. Similar to mild interval decrease in size of multiple hepatic lesions, partially calcified. 2. 2 mm right upper lobe pulmonary nodule. Recommend attention on follow-up. 3. Emphysema and aortic atherosclerosis.   08/23/2019 Imaging   CT CAP w contrast  IMPRESSION: 1. The dominant peripheral right liver metastasis has mildly increased. Other smaller liver metastases are stable. 2. Otherwise no new or progressive metastatic disease in the chest, abdomen or pelvis. 3. Aortic Atherosclerosis (ICD10-I70.0) and Emphysema (ICD10-J43.9).   11/27/2019 Imaging   CT CAP w contrast  IMPRESSION: Status post transverse colectomy with right mid abdominal colostomy.   Mildly progressive hepatic metastases, as above.   No evidence of metastatic  disease in the chest.  Small mediastinal lymph nodes are within normal limits.   Additional stable ancillary findings as above.   02/21/2020 Imaging   IMPRESSION: 1. Stable hepatic metastatic disease. 2. Aortic atherosclerosis (ICD10-I70.0). Coronary artery calcification. 3.  Emphysema (ICD10-J43.9).     CURRENT THERAPY:  Maintenance Avastin and 5FU q2weeks since 10/09/19, change to maintenance xeloda 2000 mg BID days 1-14 q21 days and q3 weeks avastin (15 mg/kg) starting 01/21/20. First cycle was taken 1038m BID due to misunderstanding.   INTERVAL HISTORY:  [name] here for a follow up. She presents to the clinic alone. She notes she is doing well. She has had increased hand and foot syndrome from Xeloda. She continues 20063mBID. She notes this hand/foot syndrome has improved, but concerned about skin darkening. She has more numbness in her toes than hands. She is on Gabapentin which helps. She notes she is tired more. She is still active at home and will take rests as needed. She denies pain, N&V or diarrhea. Her BM are normal. She notes her eating is adequate and able to gain weight.     REVIEW OF SYSTEMS:   Constitutional: Denies fevers, chills or abnormal weight loss Eyes: Denies blurriness of vision Ears, nose, mouth, throat, and face: Denies mucositis or sore throat Respiratory: Denies cough, dyspnea or wheezes Cardiovascular: Denies palpitation, chest discomfort or lower extremity swelling Gastrointestinal:  Denies nausea, heartburn or change in bowel habits Skin: Denies abnormal skin rashes (+) Skin darkening (+) Mild hand/foot syndrome.  Lymphatics: Denies new lymphadenopathy or easy bruising Neurological:Denies numbness, tingling or new weaknesses (+) Intermittent numbness in toes Behavioral/Psych: Mood is stable, no new changes  All other systems were reviewed with the patient and are negative.  MEDICAL HISTORY:  Past Medical History:  Diagnosis Date  . Anemia    low iron  .  [facility]   [facility]  . [facility]  . Hypertension 01/23/2019  . Personal history of chemotherapy 01/2019   colon CA  . S[facility] (small bowel obstruction) [facility]    SURGICAL HISTORY: Past Surgical History:  Procedure Laterality Date  . CESAREAN SECTION     x2  . COLOSTOMY N/A 01/24/2019   Procedure: End Loop Colostomy;  Surgeon: CoClovis RileyMD;  [facility];  Laterality: N/A;  . PARTIAL COLECTOMY N/A 01/24/2019   Procedure: PARTIAL COLECTOMY;  Surgeon: CoClovis RileyMD;  [facility]: General;  Laterality: N/A;  . PORTACATH PLACEMENT Right 02/28/2019   Procedure: INSERTION PORT-A-CATH WITH ULTRASOUND GUIDANCE;  Surgeon: CoClovis RileyMD;  [facility] Service: General;  Laterality: Right;    I have reviewed the social history and family history with the patient and they are unchanged from previous note.  ALLERGIES:  has No Known Allergies.  MEDICATIONS:  Current Outpatient Medications  Medication Sig Dispense Refill  . acetaminophen (TYLENOL) 325 MG tablet Take 2 tablets (650 mg total) by mouth every 6 (six) hours as needed.    . Marland KitchenmLODipine (NORVASC) 10 MG tablet Take 1 tablet (10 mg total) by mouth daily. 90 tablet 1  . capecitabine (XELODA) 500 MG tablet Take 4 tablets (2,000 mg total) by mouth 2 (two) times daily after a meal. Take for 14 days on, 7 days off, repeat every 21 days. 112 tablet 2  . ferrous sulfate 325 (65 FE) MG tablet TAKE 1 TABLET (325 MG TOTAL) BY MOUTH 2 (TWO)  TIMES DAILY WITH A MEAL. 180 tablet 1  . gabapentin (NEURONTIN) 300 MG capsule Take 1 capsule (300 mg total) by mouth 2 (two) times daily. 60 capsule 3  . hydrALAZINE (APRESOLINE) 25 MG tablet TAKE 1 TABLET (25 MG TOTAL) BY MOUTH EVERY 8 (EIGHT) HOURS. 90 tablet 0  . hydrochlorothiazide (HYDRODIURIL) 12.5 MG tablet TAKE 1 TABLET BY MOUTH EVERY DAY 30 tablet 1  . hydrocortisone (ANUSOL-HC) 2.5 % rectal cream Place 1  application rectally 2 (two) times daily. 30 g 0  . Ibuprofen (ADVIL) 200 MG CAPS Take 400 mg by mouth daily as needed (pain).    . isosorbide mononitrate (IMDUR) 30 MG 24 hr tablet Take 1 tablet (30 mg total) by mouth daily. 90 tablet 0  . lidocaine (LMX) 4 % cream Apply 1 application topically 3 (three) times daily as needed. 30 g 0  . lisinopril (ZESTRIL) 40 MG tablet TAKE 1 TABLET BY MOUTH EVERY DAY 90 tablet 1  . loperamide (IMODIUM) 2 MG capsule TAKE 1 CAPSULE (2 MG TOTAL) BY MOUTH AS NEEDED FOR DIARRHEA OR LOOSE STOOLS. 90 capsule 0  . metoprolol tartrate (LOPRESSOR) 50 MG tablet TAKE 1 TABLET BY MOUTH TWICE A DAY 180 tablet 1  . metroNIDAZOLE (FLAGYL) 250 MG tablet Take 1 tablet (250 mg total) by mouth 3 (three) times daily. 1 tablet 0  . ondansetron (ZOFRAN) 4 MG tablet Take 1 tablet (4 mg total) by mouth every 6 (six) hours as needed for nausea. 30 tablet 0  . oxyCODONE-acetaminophen (PERCOCET/ROXICET) 5-325 MG tablet Take 1-2 tablets by mouth every 6 (six) hours as needed for severe pain. 20 tablet 0  . polycarbophil (FIBERCON) 625 MG tablet Take 1 tablet (625 mg total) by mouth daily. 30 tablet 0   No current facility-administered medications for this visit.    PHYSICAL EXAMINATION: ECOG PERFORMANCE STATUS: 1 - Symptomatic but completely ambulatory  Vitals:   04/14/20 0928  BP: 138/63  Pulse: (!) 54  Resp: 15  Temp: 97.8 F (36.6 C)  SpO2: 100%   Filed Weights   04/14/20 0928  Weight: 230 lb 8 oz (104.6 kg)    Due to COVID19 we will limit examination to appearance. Patient had no complaints.  GENERAL:alert, no distress and comfortable SKIN: skin color normal, no rashes or significant lesions EYES: normal, Conjunctiva are pink and non-injected, sclera clear  NEURO: alert & oriented x 3 with fluent speech   LABORATORY DATA:  I have reviewed the data as listed CBC Latest Ref Rng & Units 04/14/2020 03/24/2020 03/03/2020  WBC 4.0 - 10.5 K/uL 7.8 6.8 5.8  Hemoglobin  12.0 - 15.0 g/dL 10.1(L) 10.5(L) 10.7(L)  Hematocrit 36.0 - 46.0 % 31.4(L) 32.2(L) 33.9(L)  Platelets 150 - 400 K/uL 176 169 183     CMP Latest Ref Rng & Units 04/14/2020 03/24/2020 03/03/2020  Glucose 70 - 99 mg/dL 116(H) 96 83  BUN 6 - 20 mg/dL 29(H) 27(H) 34(H)  Creatinine 0.44 - 1.00 mg/dL 1.25(H) 1.11(H) 1.18(H)  Sodium 135 - 145 mmol/L 138 137 141  Potassium 3.5 - 5.1 mmol/L 4.0 4.4 4.6  Chloride 98 - 111 mmol/L 112(H) 111 116(H)  CO2 22 - 32 mmol/L 19(L) 20(L) 18(L)  Calcium 8.9 - 10.3 mg/dL 9.4 9.3 9.7  Total Protein 6.5 - 8.1 g/dL 7.2 7.5 7.6  Total Bilirubin 0.3 - 1.2 mg/dL 0.4 0.5 0.3  Alkaline Phos 38 - 126 U/L 89 94 95  AST 15 - 41 U/L 16 19 21   ALT 0 -  44 U/L 10 15 14       RADIOGRAPHIC STUDIES: I have personally reviewed the radiological images as listed and agreed with the findings in the report. No results found.   ASSESSMENT & PLAN:  Casey Black is a 60 y.o. female with   1. Adenocarcinoma of transverse colon, moderately differentiated, pT4aN1aM1a stage IV with liverand nodalmetastasis,MSS, KRAS G12S(+) -Shewas diagnosed in 01/2019 afteremergent colectomy and liver biopsy. Pathology showed stage IV colonic adenocarcinoma metastatic to liver.11/23/20PET showsknown liver metastasis and metastatic lymphadenopathy in chest and right axilla.  -Wepreviouslydiscussed her cancer is stage IV and no longer eligible for surgery. The likelihood of curing this is very lowdue to her diffuse metastasis, but still very treatable. -[name] shows MSI stable disease, Kras+. There are no targetable mutation, she is not a candidate for EGFRinhibitoror immunotherapy.Avastin was added with start of full dose FOLFOX3 weeks ago. -I started her on first-line5-FU for 2 cycles on 03/11/20 while her surgical wound healed. She started full doseFOLFOXwith avastin q2weeks on 1/4/21to control her disease.Due to neuropathy Oxaliplatin was stopped after 09/24/19. She  is now on maintenancetherapy.  -For convenience I switched her maintenance therapy to oral chemo Xeloda 2039m BID 2 weeks on/1 week off and Zirabev q3weeks starting 01/21/20.  -She continues to tolerate treatment with mild hand/foot syndrome with intermittent numbness in toes. She still has adequate ambulation and hand function. Will continue to monitor.  -Labs reviewed and overall adequate to proceed with avastin today. Will start next cycle Xeloda tomorrow at same dose.  -I discussed her CEA has continued to trend up which is concerning for cancer progression. If CEA today continues to increase will proceed with scan sooner than late February.  -F/u in 3 weeks   2. Chemotherapy induced peripheral neuropathy (CIPN), G2 -secondary to Oxaliplatin, developed after cycle 12 FOLFOX. Oxaliplatin d/c after C15 on 09/24/19 -Neuropathy in fingers and feet are moderate and stable with mild decreased function in fingers. -She is currently ongabapentin100 mg AM and 300 mg qHSand titrate up if needed. -I encouraged her to watch her balance and her driving.  3.Nutrition/Anorexia  -she reportedly weighed 253 lbs a few years ago, and weighed 203 lbs at symptom onset~11/2018 -Continue to f/u withdietician -She plans to get complete dentures soon. -She continues to gain weight back.   4. 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  5. HTN, uncontrolled -on hydralazine, isosorbide, lisinopril, metoprolol,amlodipineand HCTZ -Will monitor on bevacizumab.  6.Goals of care discussion,Social support -The patient understands the goal of care is palliative. -she is full code now -She worked in [employer] 2 different jobs, but has been out of work lately due to her symptoms  -She is not getting disability, no pay, but her employer  kept her on their insurance plan -She may be eligible for Medicaid   PLAN: -Labs reviewed and adequate to proceed with C5 Zirabev today at same dose  -Start C5 Xeloda at 20071mBID, 2 weeks on/1 week off tomorrow  -Lab, flush, F/u and Zirabev in 3 weeks.   -will order restaging CT scan on next visit     No problem-specific Assessment & Plan notes found for this encounter.   No orders of the defined types were placed in this encounter.  All questions were answered. The patient knows to call the clinic with any problems, questions or concerns.  No barriers to learning was detected. The total time spent in the appointment was 30 minutes.     Casey Merle, MD 04/14/2020   I, Joslyn Devon, am acting as scribe for Casey Merle, MD.   I have reviewed the above documentation for accuracy and completeness, and I agree with the above.

## 2020-04-14 ENCOUNTER — Other Ambulatory Visit: Payer: Self-pay

## 2020-04-14 ENCOUNTER — Inpatient Hospital Stay (HOSPITAL_BASED_OUTPATIENT_CLINIC_OR_DEPARTMENT_OTHER): Payer: BC Managed Care – PPO | Admitting: Hematology

## 2020-04-14 ENCOUNTER — Inpatient Hospital Stay: Payer: BC Managed Care – PPO

## 2020-04-14 ENCOUNTER — Encounter: Payer: Self-pay | Admitting: Hematology

## 2020-04-14 ENCOUNTER — Inpatient Hospital Stay: Payer: BC Managed Care – PPO | Attending: Nurse Practitioner

## 2020-04-14 VITALS — BP 138/63 | HR 54 | Temp 97.8°F | Resp 15 | Ht 67.0 in | Wt 230.5 lb

## 2020-04-14 DIAGNOSIS — C184 Malignant neoplasm of transverse colon: Secondary | ICD-10-CM | POA: Diagnosis not present

## 2020-04-14 DIAGNOSIS — C787 Secondary malignant neoplasm of liver and intrahepatic bile duct: Secondary | ICD-10-CM | POA: Diagnosis not present

## 2020-04-14 DIAGNOSIS — I10 Essential (primary) hypertension: Secondary | ICD-10-CM | POA: Diagnosis not present

## 2020-04-14 DIAGNOSIS — C773 Secondary and unspecified malignant neoplasm of axilla and upper limb lymph nodes: Secondary | ICD-10-CM | POA: Diagnosis not present

## 2020-04-14 DIAGNOSIS — C189 Malignant neoplasm of colon, unspecified: Secondary | ICD-10-CM

## 2020-04-14 DIAGNOSIS — Z95828 Presence of other vascular implants and grafts: Secondary | ICD-10-CM

## 2020-04-14 DIAGNOSIS — Z5112 Encounter for antineoplastic immunotherapy: Secondary | ICD-10-CM | POA: Insufficient documentation

## 2020-04-14 LAB — TOTAL PROTEIN, URINE DIPSTICK: Protein, ur: NEGATIVE mg/dL

## 2020-04-14 LAB — CMP (CANCER CENTER ONLY)
ALT: 10 U/L (ref 0–44)
AST: 16 U/L (ref 15–41)
Albumin: 3.4 g/dL — ABNORMAL LOW (ref 3.5–5.0)
Alkaline Phosphatase: 89 U/L (ref 38–126)
Anion gap: 7 (ref 5–15)
BUN: 29 mg/dL — ABNORMAL HIGH (ref 6–20)
CO2: 19 mmol/L — ABNORMAL LOW (ref 22–32)
Calcium: 9.4 mg/dL (ref 8.9–10.3)
Chloride: 112 mmol/L — ABNORMAL HIGH (ref 98–111)
Creatinine: 1.25 mg/dL — ABNORMAL HIGH (ref 0.44–1.00)
GFR, Estimated: 49 mL/min — ABNORMAL LOW (ref >60)
Glucose, Bld: 116 mg/dL — ABNORMAL HIGH (ref 70–99)
Potassium: 4 mmol/L (ref 3.5–5.1)
Sodium: 138 mmol/L (ref 135–145)
Total Bilirubin: 0.4 mg/dL (ref 0.3–1.2)
Total Protein: 7.2 g/dL (ref 6.5–8.1)

## 2020-04-14 LAB — CBC WITH DIFFERENTIAL (CANCER CENTER ONLY)
Abs Immature Granulocytes: 0.03 10*3/uL (ref 0.00–0.07)
Basophils Absolute: 0 10*3/uL (ref 0.0–0.1)
Basophils Relative: 1 %
Eosinophils Absolute: 0.1 10*3/uL (ref 0.0–0.5)
Eosinophils Relative: 2 %
HCT: 31.4 % — ABNORMAL LOW (ref 36.0–46.0)
Hemoglobin: 10.1 g/dL — ABNORMAL LOW (ref 12.0–15.0)
Immature Granulocytes: 0 %
Lymphocytes Relative: 34 %
Lymphs Abs: 2.6 10*3/uL (ref 0.7–4.0)
MCH: 31.9 pg (ref 26.0–34.0)
MCHC: 32.2 g/dL (ref 30.0–36.0)
MCV: 99.1 fL (ref 80.0–100.0)
Monocytes Absolute: 0.8 10*3/uL (ref 0.1–1.0)
Monocytes Relative: 11 %
Neutro Abs: 4.1 10*3/uL (ref 1.7–7.7)
Neutrophils Relative %: 52 %
Platelet Count: 176 10*3/uL (ref 150–400)
RBC: 3.17 MIL/uL — ABNORMAL LOW (ref 3.87–5.11)
RDW: 20.1 % — ABNORMAL HIGH (ref 11.5–15.5)
WBC Count: 7.8 10*3/uL (ref 4.0–10.5)
nRBC: 0.3 % — ABNORMAL HIGH (ref 0.0–0.2)

## 2020-04-14 LAB — CEA (IN HOUSE-CHCC): CEA (CHCC-In House): 208.72 ng/mL — ABNORMAL HIGH (ref 0.00–5.00)

## 2020-04-14 MED ORDER — SODIUM CHLORIDE 0.9% FLUSH
10.0000 mL | INTRAVENOUS | Status: DC | PRN
  Administered 2020-04-14: 10 mL
  Filled 2020-04-14: qty 10

## 2020-04-14 MED ORDER — SODIUM CHLORIDE 0.9 % IV SOLN
7.5000 mg/kg | Freq: Once | INTRAVENOUS | Status: AC
  Administered 2020-04-14: 700 mg via INTRAVENOUS
  Filled 2020-04-14: qty 16

## 2020-04-14 MED ORDER — SODIUM CHLORIDE 0.9 % IV SOLN
Freq: Once | INTRAVENOUS | Status: AC
  Filled 2020-04-14: qty 250

## 2020-04-14 MED ORDER — HEPARIN SOD (PORK) LOCK FLUSH 100 UNIT/ML IV SOLN
500.0000 [IU] | Freq: Once | INTRAVENOUS | Status: AC | PRN
  Administered 2020-04-14: 500 [IU]
  Filled 2020-04-14: qty 5

## 2020-04-14 NOTE — Patient Instructions (Signed)

## 2020-04-14 NOTE — Patient Instructions (Signed)
[facility] Discharge Instructions for Patients Receiving Chemotherapy  Today you received the following chemotherapy agents: Bevacizumab (Avastin).  To help prevent nausea and vomiting after your treatment, we encourage you to take your nausea medication as prescribed.  If you develop nausea and vomiting that is not controlled by your nausea medication, call the clinic.   BELOW ARE SYMPTOMS THAT SHOULD BE REPORTED IMMEDIATELY:  *FEVER GREATER THAN 100.5 F  *CHILLS WITH OR WITHOUT FEVER  NAUSEA AND VOMITING THAT IS NOT CONTROLLED WITH YOUR NAUSEA MEDICATION  *UNUSUAL SHORTNESS OF BREATH  *UNUSUAL BRUISING OR BLEEDING  TENDERNESS IN MOUTH AND THROAT WITH OR WITHOUT PRESENCE OF ULCERS  *URINARY PROBLEMS  *BOWEL PROBLEMS  UNUSUAL RASH Items with * indicate a potential emergency and should be followed up as soon as possible.  Feel free to call the clinic should you have any questions or concerns. The clinic phone number is [phone].  Please show the CHEMO ALERT CARD at check-in to the Emergency Department and triage nurse.   

## 2020-04-15 ENCOUNTER — Telehealth: Payer: Self-pay

## 2020-04-15 ENCOUNTER — Other Ambulatory Visit: Payer: Self-pay | Admitting: Nurse Practitioner

## 2020-04-15 DIAGNOSIS — C189 Malignant neoplasm of colon, unspecified: Secondary | ICD-10-CM

## 2020-04-15 NOTE — Telephone Encounter (Signed)
Per Np :CEA continues to rise so I will go ahead and order CT before next visit.    Pt scheduled this morning CT scheduled for 04/25/20

## 2020-04-24 ENCOUNTER — Telehealth: Payer: Self-pay

## 2020-04-24 NOTE — Telephone Encounter (Signed)
Ms Casey Black called stating she has a cough, sinus headache and weakness this started 04/17/2020.  Here temp was 100 last week.  She is afebrile today.  She states her granddaughter who lives with  tested covid positive 1/12.  Ms Casey Black tested at the same time and was negative.  Her daughter tested positive last week. Per Dr Burr Medico Ms Casey Black is to get tested again and let us know results.  The CT scan for 1/21 has been canceled.  Ms Casey Black verbalized understanding.

## 2020-04-25 ENCOUNTER — Ambulatory Visit (HOSPITAL_COMMUNITY): Payer: BC Managed Care – PPO

## 2020-04-29 ENCOUNTER — Other Ambulatory Visit: Payer: BC Managed Care – PPO

## 2020-05-01 ENCOUNTER — Emergency Department (HOSPITAL_COMMUNITY): Payer: BC Managed Care – PPO

## 2020-05-01 ENCOUNTER — Other Ambulatory Visit: Payer: Self-pay

## 2020-05-01 ENCOUNTER — Telehealth: Payer: Self-pay | Admitting: *Deleted

## 2020-05-01 ENCOUNTER — Emergency Department (HOSPITAL_COMMUNITY)
Admission: EM | Admit: 2020-05-01 | Discharge: 2020-05-01 | Disposition: A | Discharge status: Home / Self Care | Payer: BC Managed Care – PPO | Attending: Emergency Medicine | Admitting: Emergency Medicine

## 2020-05-01 ENCOUNTER — Encounter (HOSPITAL_COMMUNITY): Payer: Self-pay

## 2020-05-01 DIAGNOSIS — Z85038 Personal history of other malignant neoplasm of large intestine: Secondary | ICD-10-CM | POA: Diagnosis not present

## 2020-05-01 DIAGNOSIS — Z87891 Personal history of nicotine dependence: Secondary | ICD-10-CM | POA: Diagnosis not present

## 2020-05-01 DIAGNOSIS — U071 COVID-19: Secondary | ICD-10-CM | POA: Diagnosis not present

## 2020-05-01 DIAGNOSIS — Z20822 Contact with and (suspected) exposure to covid-19: Secondary | ICD-10-CM

## 2020-05-01 DIAGNOSIS — Z79899 Other long term (current) drug therapy: Secondary | ICD-10-CM | POA: Insufficient documentation

## 2020-05-01 DIAGNOSIS — R059 Cough, unspecified: Secondary | ICD-10-CM | POA: Diagnosis not present

## 2020-05-01 DIAGNOSIS — I10 Essential (primary) hypertension: Secondary | ICD-10-CM | POA: Insufficient documentation

## 2020-05-01 LAB — COMPREHENSIVE METABOLIC PANEL
ALT: 11 U/L (ref 0–44)
AST: 18 U/L (ref 15–41)
Albumin: 3.6 g/dL (ref 3.5–5.0)
Alkaline Phosphatase: 58 U/L (ref 38–126)
Anion gap: 9 (ref 5–15)
BUN: 54 mg/dL — ABNORMAL HIGH (ref 6–20)
CO2: 17 mmol/L — ABNORMAL LOW (ref 22–32)
Calcium: 9.2 mg/dL (ref 8.9–10.3)
Chloride: 110 mmol/L (ref 98–111)
Creatinine, Ser: 1.28 mg/dL — ABNORMAL HIGH (ref 0.44–1.00)
GFR, Estimated: 48 mL/min — ABNORMAL LOW (ref >60)
Glucose, Bld: 104 mg/dL — ABNORMAL HIGH (ref 70–99)
Potassium: 4.7 mmol/L (ref 3.5–5.1)
Sodium: 136 mmol/L (ref 135–145)
Total Bilirubin: 0.8 mg/dL (ref 0.3–1.2)
Total Protein: 7.2 g/dL (ref 6.5–8.1)

## 2020-05-01 LAB — CBC
HCT: 34.7 % — ABNORMAL LOW (ref 36.0–46.0)
Hemoglobin: 11.3 g/dL — ABNORMAL LOW (ref 12.0–15.0)
MCH: 33.7 pg (ref 26.0–34.0)
MCHC: 32.6 g/dL (ref 30.0–36.0)
MCV: 103.6 fL — ABNORMAL HIGH (ref 80.0–100.0)
Platelets: 128 10*3/uL — ABNORMAL LOW (ref 150–400)
RBC: 3.35 MIL/uL — ABNORMAL LOW (ref 3.87–5.11)
RDW: 19.1 % — ABNORMAL HIGH (ref 11.5–15.5)
WBC: 4.6 10*3/uL (ref 4.0–10.5)
nRBC: 0.7 % — ABNORMAL HIGH (ref 0.0–0.2)

## 2020-05-01 LAB — URINALYSIS, ROUTINE W REFLEX MICROSCOPIC
Bilirubin Urine: NEGATIVE
Glucose, UA: NEGATIVE mg/dL
Hgb urine dipstick: NEGATIVE
Ketones, ur: NEGATIVE mg/dL
Nitrite: NEGATIVE
Protein, ur: NEGATIVE mg/dL
Specific Gravity, Urine: 1.015 (ref 1.005–1.030)
pH: 5 (ref 5.0–8.0)

## 2020-05-01 LAB — MAGNESIUM: Magnesium: 2.1 mg/dL (ref 1.7–2.4)

## 2020-05-01 LAB — SARS CORONAVIRUS 2 (TAT 6-24 HRS): SARS Coronavirus 2: POSITIVE — AB

## 2020-05-01 LAB — LIPASE, BLOOD: Lipase: 17 U/L (ref 11–51)

## 2020-05-01 MED ORDER — BENZONATATE 100 MG PO CAPS: 100.0000 mg | ORAL_CAPSULE | Freq: Three times a day (TID) | ORAL | 0 refills | Status: DC

## 2020-05-01 MED ORDER — HEPARIN SOD (PORK) LOCK FLUSH 100 UNIT/ML IV SOLN
500.0000 [IU] | Freq: Once | INTRAVENOUS | Status: AC
  Administered 2020-05-01: 500 [IU]
  Filled 2020-05-01: qty 5

## 2020-05-01 MED ORDER — SODIUM CHLORIDE 0.9 % IV BOLUS
1000.0000 mL | Freq: Once | INTRAVENOUS | Status: AC
  Administered 2020-05-01: 1000 mL via INTRAVENOUS

## 2020-05-01 MED ORDER — ONDANSETRON 4 MG PO TBDP: ORAL_TABLET | ORAL | 0 refills | Status: DC

## 2020-05-01 NOTE — ED Notes (Signed)
Patient wants port access

## 2020-05-01 NOTE — ED Notes (Signed)
Port heparin flushed and de accessed.  

## 2020-05-01 NOTE — ED Notes (Signed)
Implanted port in right chest accessed. Saline flushed and locked. Blood return noted

## 2020-05-01 NOTE — ED Provider Notes (Signed)
[facility] DEPT Provider Note   CSN: [id number] Arrival date & time: 05/01/20  [id number]     History Chief Complaint  Patient presents with  . Fatigue  . Diarrhea  . Cough    [facility] is a 60 y.o. female.  59 yo F with a cc of cough fatigue and diarrhea. Patient took her granddaughter to the doctor because she was sick and had a positive Covid test. Hx of CA on chemo.  She denies any fevers at home.  Denies abdominal pain.  Has been able to eat and drink without issue.  The history is provided by the patient.  Diarrhea Associated symptoms: no arthralgias, no chills, no fever, no headaches, no myalgias and no vomiting   Cough Associated symptoms: shortness of breath   Associated symptoms: no chest pain, no chills, no fever, no headaches, no myalgias, no rhinorrhea and no wheezing   Illness Severity:  Moderate Onset quality:  Gradual Duration:  2 days Timing:  Constant Progression:  Worsening Chronicity:  New Associated symptoms: cough, diarrhea, fatigue and shortness of breath   Associated symptoms: no chest pain, no congestion, no fever, no headaches, no myalgias, no nausea, no rhinorrhea, no vomiting and no wheezing        Past Medical History:  Diagnosis Date  . Anemia    low iron  . Cancer [facility])    colon cancer  . Colon cancer ([place]) 01/2019  . Hypertension 01/23/2019  . Personal history of chemotherapy 01/2019   colon CA  . SBO (small bowel obstruction) ([place]) 01/23/2019    Patient Active Problem List   Diagnosis Date Noted  . Chemotherapy-induced peripheral neuropathy ([name]) 09/24/2019  . Trichomonas vaginalis infection 07/24/2019  . ASCUS of cervix with negative high risk HPV 07/24/2019  . Port-A-Cath in place 03/12/2019  . Goals of care, counseling/discussion 02/15/2019  . IDA (iron deficiency anemia) 02/02/2019  . Adenocarcinoma of colon metastatic to liver ([place]) 02/02/2019  . SBO (small bowel obstruction) ([place])  01/23/2019  . Hypertension 01/23/2019  . Tobacco dependence 01/23/2019    Past Surgical History:  Procedure Laterality Date  . CESAREAN SECTION     x2  . COLOSTOMY N/A 01/24/2019   Procedure: End Loop Colostomy;  Surgeon: Clovis Riley, MD;  Location: [place];  Service: General;  Laterality: N/A;  . PARTIAL COLECTOMY N/A 01/24/2019   Procedure: PARTIAL COLECTOMY;  Surgeon: Clovis Riley, MD;  Location: [place];  Service: General;  Laterality: N/A;  . PORTACATH PLACEMENT Right 02/28/2019   Procedure: INSERTION PORT-A-CATH WITH ULTRASOUND GUIDANCE;  Surgeon: Clovis Riley, MD;  Location: [place];  Service: General;  Laterality: Right;     OB History   No obstetric history on file.     Family History  Problem Relation Age of Onset  . Kidney failure Father   . Hypertension Father   . Hypertension Sister     Social History   Tobacco Use  . Smoking status: Former Smoker    Packs/day: 0.50    Years: 29.00    Pack years: 14.50    Types: Cigarettes    Quit date: 01/04/2019    Years since quitting: 1.3  . Smokeless tobacco: Never Used  . Tobacco comment: desires patch  Vaping Use  . Vaping Use: Never used  Substance Use Topics  . Alcohol use: Not Currently    Comment: a pint a week  . Drug use: Never    Home Medications Prior to  Admission medications   Medication Sig Start Date End Date Taking? Authorizing Provider  benzonatate (TESSALON) 100 MG capsule Take 1 capsule (100 mg total) by mouth every 8 (eight) hours. 05/01/20  Yes Deno Etienne, DO  ondansetron (ZOFRAN ODT) 4 MG disintegrating tablet 4mg  ODT q4 hours prn nausea/vomit 05/01/20  Yes Deno Etienne, DO  acetaminophen (TYLENOL) 325 MG tablet Take 2 tablets (650 mg total) by mouth every 6 (six) hours as needed. 02/03/19   Debbe Odea, MD  amLODipine (NORVASC) 10 MG tablet Take 1 tablet (10 mg total) by mouth daily. 11/27/19   Truitt Merle, MD  capecitabine (XELODA) 500 MG tablet Take 4 tablets (2,000 mg total) by  mouth 2 (two) times daily after a meal. Take for 14 days on, 7 days off, repeat every 21 days. 03/12/20   Truitt Merle, MD  ferrous sulfate 325 (65 FE) MG tablet TAKE 1 TABLET (325 MG TOTAL) BY MOUTH 2 (TWO) TIMES DAILY WITH A MEAL. 12/27/19 12/26/20  [name], NP  gabapentin (NEURONTIN) 300 MG capsule Take 1 capsule (300 mg total) by mouth 2 (two) times daily. 03/24/20   [name], NP  hydrALAZINE (APRESOLINE) 25 MG tablet TAKE 1 TABLET (25 MG TOTAL) BY MOUTH EVERY 8 (EIGHT) HOURS. 12/18/19   [name], [name], MD  hydrochlorothiazide (HYDRODIURIL) 12.5 MG tablet TAKE 1 TABLET BY MOUTH EVERY DAY 03/24/20   [name], NP  hydrocortisone (ANUSOL-HC) 2.5 % rectal cream Place 1 application rectally 2 (two) times daily. 08/23/19   Joy, Shawn C, PA-C  Ibuprofen (ADVIL) 200 MG CAPS Take 400 mg by mouth daily as needed (pain).    [provider]  isosorbide mononitrate (IMDUR) 30 MG 24 hr tablet Take 1 tablet (30 mg total) by mouth daily. 02/15/20   [name], [name], MD  lidocaine (LMX) 4 % cream Apply 1 application topically 3 (three) times daily as needed. 08/23/19   Joy, Shawn C, PA-C  lisinopril (ZESTRIL) 40 MG tablet TAKE 1 TABLET BY MOUTH EVERY DAY 12/19/19   [name], [name], MD  loperamide (IMODIUM) 2 MG capsule TAKE 1 CAPSULE (2 MG TOTAL) BY MOUTH AS NEEDED FOR DIARRHEA OR LOOSE STOOLS. 07/16/19   Truitt Merle, MD  metoprolol tartrate (LOPRESSOR) 50 MG tablet TAKE 1 TABLET BY MOUTH TWICE A DAY 12/19/19   [name], [name], MD  metroNIDAZOLE (FLAGYL) 250 MG tablet Take 1 tablet (250 mg total) by mouth 3 (three) times daily. 07/25/19   [name], [name], MD  ondansetron (ZOFRAN) 4 MG tablet Take 1 tablet (4 mg total) by mouth every 6 (six) hours as needed for nausea. 02/03/19   Debbe Odea, MD  oxyCODONE-acetaminophen (PERCOCET/ROXICET) 5-325 MG tablet Take 1-2 tablets by mouth every 6 (six) hours as needed for severe pain. 08/23/19   Joy,  Shawn C, PA-C  polycarbophil (FIBERCON) 625 MG tablet Take 1 tablet (625 mg total) by mouth daily. 02/03/19   Debbe Odea, MD  prochlorperazine (COMPAZINE) 10 MG tablet Take 1 tablet (10 mg total) by mouth every 6 (six) hours as needed (Nausea or vomiting). 02/15/19 01/21/20  Truitt Merle, MD    Allergies    Patient has no known allergies.  Review of Systems   Review of Systems  Constitutional: Positive for fatigue. Negative for chills and fever.  HENT: Negative for congestion and rhinorrhea.   Eyes: Negative for redness and visual disturbance.  Respiratory: Positive for cough and shortness of breath. Negative for wheezing.  Cardiovascular: Negative for chest pain and palpitations.  Gastrointestinal: Positive for diarrhea. Negative for nausea and vomiting.  Genitourinary: Negative for dysuria and urgency.  Musculoskeletal: Negative for arthralgias and myalgias.  Skin: Negative for pallor and wound.  Neurological: Negative for dizziness and headaches.    Physical Exam Updated Vital Signs BP (!) 148/64   Pulse 63   Temp 98.5 F (36.9 C) (Oral)   Resp 17   Ht 5\' 7"  (1.702 m)   Wt 98.4 kg   SpO2 100%   BMI 33.99 kg/m   Physical Exam Vitals and nursing note reviewed.  Constitutional:      General: She is not in acute distress.    Appearance: She is well-developed and well-nourished. She is not diaphoretic.  HENT:     Head: Normocephalic and atraumatic.  Eyes:     Extraocular Movements: EOM normal.     Pupils: Pupils are equal, round, and reactive to light.  Cardiovascular:     Rate and Rhythm: Normal rate and regular rhythm.     Heart sounds: No murmur heard. No friction rub. No gallop.   Pulmonary:     Effort: Pulmonary effort is normal.     Breath sounds: No wheezing or rales.  Abdominal:     General: There is no distension.     Palpations: Abdomen is soft.     Tenderness: There is no abdominal tenderness.     Comments: Colostomy bag without erythema, drainage.    Musculoskeletal:        General: No tenderness or edema.     Cervical back: Normal range of motion and neck supple.  Skin:    General: Skin is warm and dry.  Neurological:     Mental Status: She is alert and oriented to person, place, and time.  Psychiatric:        Mood and Affect: Mood and affect normal.        Behavior: Behavior normal.     ED Results / Procedures / Treatments   Labs (all labs ordered are listed, but only abnormal results are displayed) Labs Reviewed  COMPREHENSIVE METABOLIC PANEL - Abnormal; Notable for the following components:      Result Value   CO2 17 (*)    Glucose, Bld 104 (*)    BUN 54 (*)    Creatinine, Ser 1.28 (*)    GFR, Estimated 48 (*)    All other components within normal limits  CBC - Abnormal; Notable for the following components:   RBC 3.35 (*)    Hemoglobin 11.3 (*)    HCT 34.7 (*)    MCV 103.6 (*)    RDW 19.1 (*)    Platelets 128 (*)    nRBC 0.7 (*)    All other components within normal limits  URINALYSIS, ROUTINE W REFLEX MICROSCOPIC - Abnormal; Notable for the following components:   Leukocytes,Ua MODERATE (*)    Bacteria, UA RARE (*)    All other components within normal limits  SARS CORONAVIRUS 2 (TAT 6-24 HRS)  LIPASE, BLOOD  MAGNESIUM    EKG None  Radiology DG Chest Port 1 View  Result Date: 05/01/2020 CLINICAL DATA:  Cough EXAM: PORTABLE CHEST 1 VIEW COMPARISON:  February 28, 2019, February 21, 2020 FINDINGS: The cardiomediastinal silhouette is unchanged in contour.RIGHT chest port with tip terminating over the superior cavoatrial junction. No pleural effusion. No pneumothorax. No acute pleuroparenchymal abnormality. Visualized abdomen is unremarkable. No acute osseous abnormality. IMPRESSION: No acute cardiopulmonary abnormality. Electronically Signed  By: Valentino Saxon MD   On: 05/01/2020 09:48    Procedures Procedures   Medications Ordered in ED Medications  sodium chloride 0.9 % bolus 1,000 mL (1,000  mLs Intravenous New Bag/Given 05/01/20 0957)    ED Course  I have reviewed the triage vital signs and the nursing notes.  Pertinent labs & imaging results that were available during my care of the patient were reviewed by me and considered in my medical decision making (see chart for details).    MDM Rules/Calculators/A&P                          60 yo F with a cc of cough, fatigue and diarrhea.  Patient with cancer on chemo.  Relative with recent covid diagnosis with prolonged exposure.    Likely covid, will test here, blood work.    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  Likely secondary to her diarrhea.  UA negative for infection chest x-ray viewed by me without focal infiltrate.  She was able to ambulate here without hypoxia.  No concerns for on her CBC i.e. no neutropenia or acute anemia.  Will have her contact her oncologist and the Covid clinic tomorrow if she test positive she may benefit from one of the possible monoclonal antibody therapies.  11:20 AM:  I have discussed the diagnosis/risks/treatment options with the patient and believe the pt to be eligible for discharge home to follow-up with PCP. We also discussed returning to the ED immediately if new or worsening sx occur. We discussed the sx which are most concerning (e.g., sudden worsening pain, fever, inability to tolerate by mouth) that necessitate immediate return. Medications administered to the patient during their visit and any new prescriptions provided to the patient are listed below.  Medications given during this visit Medications  sodium chloride 0.9 % bolus 1,000 mL (1,000 mLs Intravenous New Bag/Given 05/01/20 0957)     The patient appears reasonably screen and/or stabilized for discharge and I doubt any other medical condition or other [facility] requiring further screening, evaluation, or treatment in the ED at this time prior to discharge.   Final Clinical Impression(s) / ED Diagnoses Final  diagnoses:  Suspected COVID-19 virus infection    Rx / DC Orders ED Discharge Orders         Ordered    ondansetron (ZOFRAN ODT) 4 MG disintegrating tablet        05/01/20 1118    benzonatate (TESSALON) 100 MG capsule  Every 8 hours        05/01/20 1118           [place], DO 05/01/20 1120

## 2020-05-01 NOTE — Discharge Instructions (Signed)
Please call your oncologist tomorrow and let them know how you are doing.  If your Covid test is positive they may want to try and schedule you for a monoclonal antibody treatment.  This typically is done through the folks that work at the [facility] clinic which I have also provided there information in the paperwork.  Please return for worsening trouble breathing.

## 2020-05-01 NOTE — ED Notes (Signed)
Pt ambulated in room, maintained O2 sats between 99-100%.

## 2020-05-01 NOTE — Telephone Encounter (Signed)
Left message that patient is in hospital with COVID symptoms. Is supposed to have CT scan on 1/29 and would like to ask if it can be done in hospital. Requesting return call.

## 2020-05-01 NOTE — ED Triage Notes (Addendum)
Patient reports that she has been having increased fatigue and increased loose stool in her colostomy bag. Patient is currently receiving chemo treatments. Patient noted to have a frequent non productive cough. When questioned, patient states she has had x 2 weeks.  Patient states her granddaughter had Covid 04/13/20. Patient states she had a negative Covid test on 04/13/20. Patient states she started feeling bad on 04/15/20.

## 2020-05-02 ENCOUNTER — Ambulatory Visit (HOSPITAL_COMMUNITY): Payer: BC Managed Care – PPO

## 2020-05-02 ENCOUNTER — Telehealth: Payer: Self-pay

## 2020-05-02 ENCOUNTER — Telehealth: Payer: Self-pay | Admitting: *Deleted

## 2020-05-02 ENCOUNTER — Telehealth: Payer: Self-pay | Admitting: Infectious Diseases

## 2020-05-02 ENCOUNTER — Telehealth (HOSPITAL_COMMUNITY): Payer: Self-pay | Admitting: Family

## 2020-05-02 ENCOUNTER — Other Ambulatory Visit: Payer: BC Managed Care – PPO

## 2020-05-02 NOTE — Telephone Encounter (Signed)
Transition Care Management Unsuccessful Follow-up Telephone Call  Date of discharge and from where:  05/01/2020 [facility]  Attempts:  1st Attempt  Reason for unsuccessful TCM follow-up call:  Left voice message

## 2020-05-02 NOTE — Telephone Encounter (Signed)
Called to discuss with patient about COVID-19 symptoms and the use of one of the available treatments for those with mild to moderate Covid symptoms and at a high risk of hospitalization.  Pt appears to qualify for outpatient treatment due to co-morbid conditions and/or a member of an at-risk group in accordance with the FDA Emergency Use Authorization.    Unable to reach pt - VM left  Casey Black   

## 2020-05-02 NOTE — Progress Notes (Incomplete)
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 Bowel-gas pattern consistent with small bowel obstruction. No free air. 2. No acute chest findings.   01/23/2019 Imaging   CT AP IMPRESSION: Obstructing mid transverse colonic mass with mild regional adenopathy and hepatic metastatic disease. The mass likely extends through the serosa; no ascites or peritoneal nodularity.   01/24/2019 Surgery   Surgeon: Clovis Riley MD Assistant: [name] Procedure performed: Transverse colectomy with end colostomy, liver biopsy Procedure classification: URGENT/EMERGENT Preop diagnosis: Obstructing, metastatic transverse colon mass Post-op diagnosis/intraop findings: Same   01/24/2019 Pathology Results   FINAL MICROSCOPIC DIAGNOSIS:   A. COLON, TRANSVERSE, RESECTION:  Colonic adenocarcinoma, 5 cm.  Carcinoma extends into pericolonic connective tissue and focally to  serosal surface.  Margins not involved.  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   B. LIVER NODULE, LEFT, BIOPSY:  Metastatic adenocarcinoma.    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 Hypermetabolic metastatic disease in the liver and mediastinal/hilar/axillary lymph nodes. 2. Focal hypermetabolism in the rectum. Continued attention on follow-up exams is warranted. 3. Focal hypermetabolism medial to the right adrenal gland may be within a metastatic lymph node, better visualized on 01/23/2019. 4. Aortic atherosclerosis (ICD10-170.0). Coronary artery calcification.   03/12/2019 -  Chemotherapy   She started 5FU q2weeks on 03/12/19 for 2 cycles. She started full dose FOLFOX with Avastin on 04/09/19. Oxaliplatin dose reduced repeatedly due to neuropathy C12 and held since C16 on 10/09/19. Now on maintenance Avastin and 5FU q2weeks since 10/09/19       -Maintenance change to maintenance xeloda 2000 mg BID days 1-14 q21 days and q3 weeks Zirabev (15 mg/kg) starting 01/21/20. First cycle was taken 1071m BID due to misunderstanding.    05/31/2019 Imaging   Restaging CT CAP IMPRESSION: 1. Similar to mild interval decrease in size of multiple hepatic lesions, partially calcified. 2. 2 mm right upper lobe pulmonary nodule. Recommend attention on follow-up. 3. Emphysema and aortic atherosclerosis.   08/23/2019 Imaging   CT CAP w contrast  IMPRESSION: 1. The dominant peripheral right liver metastasis has mildly increased. Other smaller liver metastases are stable. 2. Otherwise no new or progressive metastatic disease in the chest, abdomen or pelvis. 3. Aortic Atherosclerosis (ICD10-I70.0) and Emphysema (ICD10-J43.9).   11/27/2019 Imaging   CT CAP w contrast  IMPRESSION: Status post transverse colectomy with right mid abdominal colostomy.   Mildly progressive hepatic metastases, as above.   No evidence of metastatic  disease in the chest.  Small mediastinal lymph nodes are within normal limits.   Additional stable ancillary findings as above.   02/21/2020 Imaging   IMPRESSION: 1. Stable hepatic metastatic disease. 2. Aortic atherosclerosis (ICD10-I70.0). Coronary artery calcification. 3.  Emphysema (ICD10-J43.9).      CURRENT THERAPY:  Maintenance Avastin and 5FU q2weeks since 10/09/19, change to maintenance xeloda 2000 mg BID days 1-14 q21 days and q3 weeks avastin (15 mg/kg) starting 01/21/20. First cycle was taken 1044m BID due to misunderstanding.  INTERVAL HISTORY: *** Casey Black here for a follow up. She presents to the clinic alone.   REVIEW OF SYSTEMS:  *** Constitutional: Denies fevers, chills or abnormal weight loss Eyes: Denies blurriness of vision Ears, nose, mouth, throat, and face: Denies mucositis or sore throat Respiratory: Denies cough, dyspnea or wheezes Cardiovascular: Denies palpitation, chest discomfort or lower extremity swelling Gastrointestinal:  Denies nausea, heartburn or change in bowel habits Skin: Denies abnormal skin rashes Lymphatics: Denies new lymphadenopathy or easy bruising Neurological:Denies numbness, tingling or new weaknesses Behavioral/Psych: Mood is stable, no new changes  All other systems were reviewed with the patient and are negative.  MEDICAL HISTORY:  Past Medical History:  Diagnosis Date  . Anemia    low iron  . Cancer ([facility]    colon cancer  . [place]  . Hypertension 01/23/2019  . Personal history of chemotherapy 01/2019   colon CA  . [place]) [place]    SURGICAL HISTORY: Past Surgical History:  Procedure Laterality Date  . CESAREAN SECTION     x2  . COLOSTOMY N/A 01/24/2019   Procedure: End Loop Colostomy;  Surgeon: CClovis Riley MD;  [place] [facility]  Service: General;  Laterality: N/A;  . PARTIAL COLECTOMY N/A 01/24/2019   Procedure: PARTIAL COLECTOMY;  Surgeon: CClovis Riley  MD;  [place]  Service: General;  Laterality: N/A;  . PORTACATH PLACEMENT Right 02/28/2019   Procedure: INSERTION PORT-A-CATH WITH ULTRASOUND GUIDANCE;  Surgeon: CClovis Riley MD;  [place] [facility]  Service: General;  Laterality: Right;    I have reviewed the social history and family history with the patient and they are unchanged from previous note.  ALLERGIES:  has No Known Allergies.  MEDICATIONS:  Current Outpatient Medications  Medication Sig Dispense Refill  . acetaminophen (TYLENOL) 325 MG tablet Take 2 tablets (650 mg total) by mouth every 6 (six) hours as needed.    .Marland KitchenamLODipine (NORVASC) 10 MG tablet Take 1 tablet (10 mg total) by mouth daily. 90 tablet 1  . benzonatate (TESSALON) 100 MG capsule Take 1 capsule (100 mg total) by mouth every 8 (eight) hours. 21 capsule 0  . capecitabine (XELODA) 500 MG tablet Take 4 tablets (2,000 mg total) by mouth 2 (two) times daily after a meal. Take for 14 days on, 7 days off, repeat every 21 days. 112 tablet 2  . ferrous sulfate 325 (65 FE) MG tablet TAKE 1 TABLET (325 MG TOTAL) BY MOUTH 2 (TWO) TIMES DAILY WITH A MEAL. 180 tablet 1  . gabapentin (NEURONTIN) 300 MG capsule Take 1 capsule (300 mg total) by mouth 2 (two) times daily. 60 capsule 3  . hydrALAZINE (APRESOLINE) 25 MG tablet TAKE 1 TABLET (25 MG TOTAL) BY MOUTH EVERY 8 (EIGHT) HOURS. 90 tablet 0  . hydrochlorothiazide (HYDRODIURIL) 12.5 MG tablet TAKE 1 TABLET BY MOUTH EVERY DAY 30 tablet 1  . hydrocortisone (ANUSOL-HC) 2.5 % rectal cream Place 1  application rectally 2 (two) times daily. 30 g 0  . Ibuprofen (ADVIL) 200 MG CAPS Take 400 mg by mouth daily as needed (pain).    . isosorbide mononitrate (IMDUR) 30 MG 24 hr tablet Take 1 tablet (30 mg total) by mouth daily. 90 tablet 0  . lidocaine (LMX) 4 % cream Apply 1 application topically 3 (three) times daily as needed. 30 g 0  . lisinopril (ZESTRIL) 40 MG tablet TAKE 1 TABLET BY MOUTH EVERY DAY 90 tablet 1  . loperamide  (IMODIUM) 2 MG capsule TAKE 1 CAPSULE (2 MG TOTAL) BY MOUTH AS NEEDED FOR DIARRHEA OR LOOSE STOOLS. 90 capsule 0  . metoprolol tartrate (LOPRESSOR) 50 MG tablet TAKE 1 TABLET BY MOUTH TWICE A DAY 180 tablet 1  . metroNIDAZOLE (FLAGYL) 250 MG tablet Take 1 tablet (250 mg total) by mouth 3 (three) times daily. 1 tablet 0  . ondansetron (ZOFRAN ODT) 4 MG disintegrating tablet 76m ODT q4 hours prn nausea/vomit 20 tablet 0  . ondansetron (ZOFRAN) 4 MG tablet Take 1 tablet (4 mg total) by mouth every 6 (six) hours as needed for nausea. 30 tablet 0  . oxyCODONE-acetaminophen (PERCOCET/ROXICET) 5-325 MG tablet Take 1-2 tablets by mouth every 6 (six) hours as needed for severe pain. 20 tablet 0  . polycarbophil (FIBERCON) 625 MG tablet Take 1 tablet (625 mg total) by mouth daily. 30 tablet 0   No current facility-administered medications for this visit.    PHYSICAL EXAMINATION: ECOG PERFORMANCE STATUS: {CHL ONC ECOG PS:[id number]}  There were no vitals filed for this visit. There were no vitals filed for this visit. *** GENERAL:alert, no distress and comfortable SKIN: skin color, texture, turgor are normal, no rashes or significant lesions EYES: normal, Conjunctiva are pink and non-injected, sclera clear {OROPHARYNX:no exudate, no erythema and lips, buccal mucosa, and tongue normal}  NECK: supple, thyroid normal size, non-tender, without nodularity LYMPH:  no palpable lymphadenopathy in the cervical, axillary {or inguinal} LUNGS: clear to auscultation and percussion with normal breathing effort HEART: regular rate & rhythm and no murmurs and no lower extremity edema ABDOMEN:abdomen soft, non-tender and normal bowel sounds Musculoskeletal:no cyanosis of digits and no clubbing  NEURO: alert & oriented x 3 with fluent speech, no focal motor/sensory deficits  LABORATORY DATA:  I have reviewed the data as listed CBC Latest Ref Rng & Units 05/01/2020 04/14/2020 03/24/2020  WBC 4.0 - 10.5 K/uL 4.6 7.8  6.8  Hemoglobin 12.0 - 15.0 g/dL 11.3(L) 10.1(L) 10.5(L)  Hematocrit 36.0 - 46.0 % 34.7(L) 31.4(L) 32.2(L)  Platelets 150 - 400 K/uL 128(L) 176 169     CMP Latest Ref Rng & Units 05/01/2020 04/14/2020 03/24/2020  Glucose 70 - 99 mg/dL 104(H) 116(H) 96  BUN 6 - 20 mg/dL 54(H) 29(H) 27(H)  Creatinine 0.44 - 1.00 mg/dL 1.28(H) 1.25(H) 1.11(H)  Sodium 135 - 145 mmol/L 136 138 137  Potassium 3.5 - 5.1 mmol/L 4.7 4.0 4.4  Chloride 98 - 111 mmol/L 110 112(H) 111  CO2 22 - 32 mmol/L 17(L) 19(L) 20(L)  Calcium 8.9 - 10.3 mg/dL 9.2 9.4 9.3  Total Protein 6.5 - 8.1 g/dL 7.2 7.2 7.5  Total Bilirubin 0.3 - 1.2 mg/dL 0.8 0.4 0.5  Alkaline Phos 38 - 126 U/L 58 89 94  AST 15 - 41 U/L _0 ALT 0 - 44 U/L _1 RADIOGRAPHIC STUDIES: I have personally reviewed the radiological images as listed and agreed with the findings in the  report. DG Chest Port 1 View  Result Date: 05/01/2020 CLINICAL DATA:  Cough EXAM: PORTABLE CHEST 1 VIEW COMPARISON:  February 28, 2019, February 21, 2020 FINDINGS: The cardiomediastinal silhouette is unchanged in contour.RIGHT chest port with tip terminating over the superior cavoatrial junction. No pleural effusion. No pneumothorax. No acute pleuroparenchymal abnormality. Visualized abdomen is unremarkable. No acute osseous abnormality. IMPRESSION: No acute cardiopulmonary abnormality. Electronically Signed   By: Valentino Saxon MD   On: 05/01/2020 09:48     ASSESSMENT & PLAN:  Casey Black is a 60 y.o. female with   1. Adenocarcinoma of transverse colon, moderately differentiated, pT4aN1aM1a stage IV with liverand nodalmetastasis,MSS, KRAS G12S(+) -Shewas diagnosed in 01/2019 afteremergent colectomy and liver biopsy. Pathology showed stage IV colonic adenocarcinoma metastatic to liver.11/23/20PET showsknown liver metastasis and metastatic lymphadenopathy in chest and right axilla.  -Wepreviouslydiscussed her cancer is stage IV and no longer  eligible for surgery. The likelihood of curing this is very lowdue to her diffuse metastasis, but still very treatable. -[name] shows MSI stable disease, Kras+. There are no targetable mutation, she is not a candidate for EGFRinhibitoror immunotherapy.Avastin was added with start of full dose FOLFOX3 weeks ago. -I started her on first-line5-FU for 2 cycles on 03/11/20 while her surgical wound healed. She started full doseFOLFOXwith avastin q2weeks on 1/4/21to control her disease.Due to neuropathy Oxaliplatin was stopped after 09/24/19. She is now on maintenancetherapy. -For convenience Iswitched her maintenance therapy tooral chemo Xeloda2058mBID 2 weeks on/1 week offand Zirabev q3weeks starting 01/21/20.  -We discussed her CT CAP from 05/02/20 which shows ***    -She continues to tolerate treatment with mild hand/foot syndrome with intermittent numbness in toes. She still has adequate ambulation and hand function. Will continue to monitor.  -Labs reviewed and overall adequate to proceed with avastin today. Will start next cycle Xeloda tomorrow at same dose.  -I discussed her CEA has continued to trend up which is concerning for cancer progression. If CEA today continues to increase will proceed with scan sooner than late February.  -F/u in 3 weeks   2. Chemotherapy induced peripheral neuropathy (CIPN), G2 -secondary to Oxaliplatin, developed after cycle 12 FOLFOX. Oxaliplatin d/c after C15 on 09/24/19 -Neuropathy in fingers and feet are moderate and stable with mild decreased function in fingers. -She is currently ongabapentin100 mg AM and 300 mg qHSand titrate up if needed. -I encouraged her to watch her balance and her driving.  3.Nutrition/Anorexia  -she reportedly weighed 253 lbs a few years ago, and weighed 203 lbs at symptom onset~11/2018 -Continue to f/u withdietician -She plans to get complete dentures soon. -She continues to gain weight  back.  4. 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  5. HTN, uncontrolled -on hydralazine, isosorbide, lisinopril, metoprolol,amlodipineand HCTZ -Will monitor on bevacizumab.  6.Goals of care discussion,Social support -The patient understands the goal of care is palliative. -she is full code now -She worked in [employer] 2 different jobs, but has been out of work lately due to her symptoms  -She is not getting disability, no pay, but her employer kept her on their insurance plan -She may be eligible for Medicaid   PLAN: *** -Labs reviewed and adequate to proceed with C5 Zirabev todayat same dose  -Start C5 Xeloda at 20026mBID, 2 weeks on/1 week off tomorrow  -Lab, flush, F/u and Zirabev in  3 weeks.  -will order restaging CT scan on next visit    No problem-specific Assessment & Plan notes found for this encounter.   No orders of the defined types were placed in this encounter.  All questions were answered. The patient knows to call the clinic with any problems, questions or concerns. No barriers to learning was detected. The total time spent in the appointment was {CHL ONC TIME VISIT - QIONG:[id number]}.     Joslyn Devon 05/02/2020   Oneal Deputy, am acting as scribe for Truitt Merle, MD.   {Add scribe attestation statement}

## 2020-05-02 NOTE — Telephone Encounter (Signed)
Called to report she continues to have cough, congestion and extreme fatigue. Had COVID test in ED yesterday and has not heard results yet. Scheduled for CT scan today. Informed her that her COVID test is positive and she needs to isolate for 14 days and not have CT scan (cancelled). She also need to make anyone she lives with or has been in close contact with without a mask aware. Will notify MD to determine if she will qualify for the infusion clinic.

## 2020-05-03 ENCOUNTER — Other Ambulatory Visit: Payer: BC Managed Care – PPO

## 2020-05-05 ENCOUNTER — Other Ambulatory Visit: Payer: Self-pay | Admitting: Physician Assistant

## 2020-05-05 ENCOUNTER — Telehealth: Payer: Self-pay | Admitting: *Deleted

## 2020-05-05 ENCOUNTER — Other Ambulatory Visit: Payer: BC Managed Care – PPO

## 2020-05-05 ENCOUNTER — Ambulatory Visit: Payer: BC Managed Care – PPO

## 2020-05-05 ENCOUNTER — Ambulatory Visit: Payer: BC Managed Care – PPO | Admitting: Hematology

## 2020-05-05 DIAGNOSIS — F17200 Nicotine dependence, unspecified, uncomplicated: Secondary | ICD-10-CM

## 2020-05-05 DIAGNOSIS — I10 Essential (primary) hypertension: Secondary | ICD-10-CM

## 2020-05-05 DIAGNOSIS — U071 COVID-19: Secondary | ICD-10-CM

## 2020-05-05 DIAGNOSIS — D500 Iron deficiency anemia secondary to blood loss (chronic): Secondary | ICD-10-CM

## 2020-05-05 DIAGNOSIS — C189 Malignant neoplasm of colon, unspecified: Secondary | ICD-10-CM

## 2020-05-05 NOTE — Progress Notes (Signed)
I connected by phone with Casey Black on 05/05/2020 at 4:16 PM to discuss the potential use of a new treatment for mild to moderate COVID-19 viral infection in non-hospitalized patients.  This patient is a 60 y.o. female that meets the FDA criteria for Emergency Use Authorization of COVID monoclonal antibody sotrovimab.  Has a (+) direct SARS-CoV-2 viral test result  Has mild or moderate COVID-19   Is NOT hospitalized due to COVID-19  Is within 10 days of symptom onset  Has at least one of the high risk factor(s) for progression to severe COVID-19 and/or hospitalization as defined in EUA.  Specific high risk criteria : BMI > 25, Diabetes, Immunosuppressive Disease or Treatment, Cardiovascular disease or hypertension, Chronic Lung Disease and Other high risk medical condition per CDC:  high SVI, vaccinated but not boosted.    I have spoken and communicated the following to the patient or parent/caregiver regarding COVID monoclonal antibody treatment:  1. FDA has authorized the emergency use for the treatment of mild to moderate COVID-19 in adults and pediatric patients with positive results of direct SARS-CoV-2 viral testing who are 65 years of age and older weighing at least 40 kg, and who are at high risk for progressing to severe COVID-19 and/or hospitalization.  2. The significant known and potential risks and benefits of COVID monoclonal antibody, and the extent to which such potential risks and benefits are unknown.  3. Information on available alternative treatments and the risks and benefits of those alternatives, including clinical trials.  4. Patients treated with COVID monoclonal antibody should continue to self-isolate and use infection control measures (e.g., wear mask, isolate, social distance, avoid sharing personal items, clean and disinfect "high touch" surfaces, and frequent handwashing) according to CDC guidelines.   5. The patient or parent/caregiver has the option  to accept or refuse COVID monoclonal antibody treatment.  After reviewing this information with the patient, the patient has agreed to receive one of the available covid 19 monoclonal antibodies and will be provided an appropriate fact sheet prior to infusion.   Sx onset 1/27. Vaccinated but not boosted. On chemo. Set up for infusion tomorrow 2/1 @ 10:30am. [name], PA-C 05/05/2020 4:16 PM

## 2020-05-05 NOTE — Telephone Encounter (Signed)
Called pt to see how she was doing.  She states coughing still & running nose, H/A gone, & diarrhea is better.  She has had symptoms since Thurs.  Informed that [facility] has been trying to reach her.  Gave hotline # to call if interested in treatment.  She should call us back in a week.  Instructed not to start xeloda.

## 2020-05-06 ENCOUNTER — Ambulatory Visit (HOSPITAL_COMMUNITY)
Admission: RE | Admit: 2020-05-06 | Discharge: 2020-05-06 | Disposition: A | Discharge status: Home / Self Care | Payer: BC Managed Care – PPO | Source: Ambulatory Visit | Attending: Pulmonary Disease | Admitting: Pulmonary Disease

## 2020-05-06 DIAGNOSIS — F17200 Nicotine dependence, unspecified, uncomplicated: Secondary | ICD-10-CM | POA: Insufficient documentation

## 2020-05-06 DIAGNOSIS — I10 Essential (primary) hypertension: Secondary | ICD-10-CM | POA: Insufficient documentation

## 2020-05-06 DIAGNOSIS — U071 COVID-19: Secondary | ICD-10-CM | POA: Insufficient documentation

## 2020-05-06 DIAGNOSIS — C787 Secondary malignant neoplasm of liver and intrahepatic bile duct: Secondary | ICD-10-CM | POA: Insufficient documentation

## 2020-05-06 DIAGNOSIS — C189 Malignant neoplasm of colon, unspecified: Secondary | ICD-10-CM | POA: Diagnosis not present

## 2020-05-06 MED ORDER — SOTROVIMAB 500 MG/8ML IV SOLN
500.0000 mg | Freq: Once | INTRAVENOUS | Status: AC
  Administered 2020-05-06: 500 mg via INTRAVENOUS

## 2020-05-06 MED ORDER — EPINEPHRINE 0.3 MG/0.3ML IJ SOAJ: 0.3000 mg | Freq: Once | INTRAMUSCULAR | Status: DC | PRN

## 2020-05-06 MED ORDER — FAMOTIDINE IN NACL 20-0.9 MG/50ML-% IV SOLN: 20.0000 mg | Freq: Once | INTRAVENOUS | Status: DC | PRN

## 2020-05-06 MED ORDER — METHYLPREDNISOLONE SODIUM SUCC 125 MG IJ SOLR: 125.0000 mg | Freq: Once | INTRAMUSCULAR | Status: DC | PRN

## 2020-05-06 MED ORDER — ALBUTEROL SULFATE HFA 108 (90 BASE) MCG/ACT IN AERS: 2.0000 | INHALATION_SPRAY | Freq: Once | RESPIRATORY_TRACT | Status: DC | PRN

## 2020-05-06 MED ORDER — SODIUM CHLORIDE 0.9 % IV SOLN: INTRAVENOUS | Status: DC | PRN

## 2020-05-06 MED ORDER — HEPARIN SOD (PORK) LOCK FLUSH 100 UNIT/ML IV SOLN
500.0000 [IU] | INTRAVENOUS | Status: AC | PRN
  Administered 2020-05-06: 500 [IU]

## 2020-05-06 MED ORDER — DIPHENHYDRAMINE HCL 50 MG/ML IJ SOLN: 50.0000 mg | Freq: Once | INTRAMUSCULAR | Status: DC | PRN

## 2020-05-06 NOTE — Progress Notes (Signed)
Patient reviewed Fact Sheet for Patients, Parents, and Caregivers for Emergency Use Authorization (EUA) of sotrovimab for the Treatment of Coronavirus. Patient also reviewed and is agreeable to the estimated cost of treatment. Patient is agreeable to proceed.   

## 2020-05-06 NOTE — Progress Notes (Signed)
Diagnosis: COVID-19  Physician: Dr. Patrick Wright  Procedure: Covid Infusion Clinic Med: Sotrovimab infusion - Provided patient with sotrovimab fact sheet for patients, parents, and caregivers prior to infusion.   Complications: No immediate complications noted  Discharge: Discharged home    

## 2020-05-06 NOTE — Discharge Instructions (Signed)

## 2020-05-07 ENCOUNTER — Other Ambulatory Visit: Payer: Self-pay | Admitting: Hematology

## 2020-05-07 DIAGNOSIS — C189 Malignant neoplasm of colon, unspecified: Secondary | ICD-10-CM

## 2020-05-09 NOTE — Progress Notes (Signed)
[facility]   [phone] Fax:([phone]   Clinic Follow up Note   Patient Care Team: [name], [name], MD as PCP - General (Internal Medicine) Clovis Riley, MD as Consulting Physician (General Surgery) Truitt Merle, MD as Consulting Physician (Hematology) [name], NP as Nurse Practitioner (Nurse Practitioner)   I connected with Dorinda Hill on 05/12/2020 at 11:20 AM EST by telephone visit and verified that I am speaking with the correct person using two identifiers.  I discussed the limitations, risks, security and privacy concerns of performing an evaluation and management service by telephone and the availability of in person appointments. I also discussed with the patient that there may be a patient responsible charge related to this service. The patient expressed understanding and agreed to proceed.   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 Bowel-gas pattern consistent with small bowel obstruction. No free air. 2. No acute chest findings.   01/23/2019 Imaging   CT AP IMPRESSION: Obstructing mid transverse colonic mass with mild regional adenopathy and hepatic metastatic disease. The mass likely extends through the serosa; no ascites or peritoneal nodularity.   01/24/2019 Surgery   Surgeon: Clovis Riley MD Assistant: [name] Procedure performed: Transverse colectomy with  end colostomy, liver biopsy Procedure classification: URGENT/EMERGENT Preop diagnosis: Obstructing, metastatic transverse colon mass Post-op diagnosis/intraop findings: Same   01/24/2019 Pathology Results   FINAL MICROSCOPIC DIAGNOSIS:   A. COLON, TRANSVERSE, RESECTION:  Colonic adenocarcinoma, 5 cm.  Carcinoma extends into pericolonic connective tissue and focally to  serosal surface.  Margins not involved.  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   B. LIVER NODULE, LEFT, BIOPSY:  Metastatic adenocarcinoma.    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 Hypermetabolic metastatic disease in the liver and mediastinal/hilar/axillary lymph nodes. 2. Focal hypermetabolism in the rectum. Continued attention on follow-up exams is warranted. 3. Focal hypermetabolism medial to the right adrenal gland may be within a metastatic lymph node, better visualized on 01/23/2019. 4. Aortic atherosclerosis (ICD10-170.0). Coronary artery calcification.   03/12/2019 -  Chemotherapy   She started 5FU q2weeks on 03/12/19 for 2 cycles. She started full dose FOLFOX with Avastin on 04/09/19. Oxaliplatin dose reduced repeatedly due to neuropathy C12 and held since C16 on 10/09/19. Now on maintenance Avastin and 5FU q2weeks since 10/09/19       -Maintenance change to maintenance xeloda 2000 mg BID days 1-14 q21 days and q3 weeks Zirabev (15 mg/kg) starting 01/21/20. First cycle was taken 1023m BID due to misunderstanding.    05/31/2019 Imaging   Restaging CT CAP IMPRESSION: 1. Similar to mild interval decrease in size of multiple hepatic  lesions, partially calcified. 2. 2 mm right upper lobe pulmonary nodule. Recommend  attention on follow-up. 3. Emphysema and aortic atherosclerosis.   08/23/2019 Imaging   CT CAP w contrast  IMPRESSION: 1. The dominant peripheral right liver metastasis has mildly increased. Other smaller liver metastases are stable. 2. Otherwise no new or progressive metastatic disease in the chest, abdomen or pelvis. 3. Aortic Atherosclerosis (ICD10-I70.0) and Emphysema (ICD10-J43.9).   11/27/2019 Imaging   CT CAP w contrast  IMPRESSION: Status post transverse colectomy with right mid abdominal colostomy.   Mildly progressive hepatic metastases, as above.   No evidence of metastatic disease in the chest. Small mediastinal lymph nodes are within normal limits.   Additional stable ancillary findings as above.   02/21/2020 Imaging   IMPRESSION: 1. Stable hepatic metastatic disease. 2. Aortic atherosclerosis (ICD10-I70.0). Coronary artery calcification. 3.  Emphysema (ICD10-J43.9).      CURRENT THERAPY:  Maintenance Avastin and 5FU q2weeks since 10/09/19, change to maintenance Xeloda 2000 mg BID days 1-14 q21 days and q3 weeks avastin (15 mg/kg) starting 01/21/20. First cycle was taken 1014m BID due to misunderstanding.Full treatment held after 04[place]/20 [place] Restarted Xeloda on 05/12/20  INTERVAL HISTORY:  MDayna Black here for a follow up. They identified themselves by birth date. She notes having cough and fatigue and weakness from COVID. Her symptoms overall resolved after her COVID treatment except for mild cough. She has held Xeloda since 1/10.      REVIEW OF SYSTEMS:   Constitutional: Denies fevers, chills or abnormal weight loss Eyes: Denies blurriness of vision Ears, nose, mouth, throat, and face: Denies mucositis or sore throat Respiratory: Denies cough, dyspnea or wheezes Cardiovascular: Denies palpitation, chest discomfort or lower extremity swelling Gastrointestinal:  Denies nausea, heartburn or change in bowel habits Skin: Denies abnormal skin  rashes Lymphatics: Denies new lymphadenopathy or easy bruising Neurological:Denies numbness, tingling or new weaknesses Behavioral/Psych: Mood is stable, no new changes  All other systems were reviewed with the patient and are negative.  MEDICAL HISTORY:  Past Medical History:  Diagnosis Date  . Anemia    low iron  . Cancer ([facility]    colon cancer  . [place]  . Hypertension 01/23/2019  . Personal history of chemotherapy 01/2019   colon CA  . SBO (small bowel obstruction) ([place] 01/23/2019    SURGICAL HISTORY: Past Surgical History:  Procedure Laterality Date  . CESAREAN SECTION     x2  . COLOSTOMY N/A 01/24/2019   Procedure: End Loop Colostomy;  Surgeon: CClovis Riley MD;  [facility]  Service: General;  Laterality: N/A;  . PARTIAL COLECTOMY N/A 01/24/2019   Procedure: PARTIAL COLECTOMY;  Surgeon: CClovis Riley MD;  Location: [facility]  Service: General;  Laterality: N/A;  . PORTACATH PLACEMENT Right 02/28/2019   Procedure: INSERTION PORT-A-CATH WITH ULTRASOUND GUIDANCE;  Surgeon: CClovis Riley MD;  [place]  Service: General;  Laterality: Right;    I have reviewed the social history and family history with the patient and they are unchanged from previous note.  ALLERGIES:  has No Known Allergies.  MEDICATIONS:  Current Outpatient Medications  Medication Sig Dispense Refill  . acetaminophen (TYLENOL) 325 MG tablet Take 2 tablets (650 mg total) by mouth every 6 (six) hours as needed.    .Marland KitchenamLODipine (NORVASC) 10 MG tablet Take 1 tablet (10 mg total) by mouth daily. 90 tablet 1  . benzonatate (TESSALON) 100 MG capsule Take 1 capsule (100  mg total) by mouth every 8 (eight) hours. 21 capsule 0  . capecitabine (XELODA) 500 MG tablet Take 4 tablets (2,000 mg total) by mouth 2 (two) times daily after a meal. Take for 14 days on, 7 days off, repeat every 21 days. 112 tablet 2  . ferrous sulfate 325 (65 FE) MG tablet TAKE 1 TABLET (325 MG  TOTAL) BY MOUTH 2 (TWO) TIMES DAILY WITH A MEAL. 180 tablet 1  . gabapentin (NEURONTIN) 300 MG capsule Take 1 capsule (300 mg total) by mouth 2 (two) times daily. 60 capsule 3  . hydrALAZINE (APRESOLINE) 25 MG tablet TAKE 1 TABLET (25 MG TOTAL) BY MOUTH EVERY 8 (EIGHT) HOURS. 90 tablet 0  . hydrochlorothiazide (HYDRODIURIL) 12.5 MG tablet TAKE 1 TABLET BY MOUTH EVERY DAY 30 tablet 1  . hydrocortisone (ANUSOL-HC) 2.5 % rectal cream Place 1 application rectally 2 (two) times daily. 30 g 0  . Ibuprofen (ADVIL) 200 MG CAPS Take 400 mg by mouth daily as needed (pain).    . isosorbide mononitrate (IMDUR) 30 MG 24 hr tablet Take 1 tablet (30 mg total) by mouth daily. 90 tablet 0  . lidocaine (LMX) 4 % cream Apply 1 application topically 3 (three) times daily as needed. 30 g 0  . lisinopril (ZESTRIL) 40 MG tablet TAKE 1 TABLET BY MOUTH EVERY DAY 90 tablet 1  . loperamide (IMODIUM) 2 MG capsule TAKE 1 CAPSULE (2 MG TOTAL) BY MOUTH AS NEEDED FOR DIARRHEA OR LOOSE STOOLS. 90 capsule 0  . metoprolol tartrate (LOPRESSOR) 50 MG tablet TAKE 1 TABLET BY MOUTH TWICE A DAY 180 tablet 1  . metroNIDAZOLE (FLAGYL) 250 MG tablet Take 1 tablet (250 mg total) by mouth 3 (three) times daily. 1 tablet 0  . ondansetron (ZOFRAN ODT) 4 MG disintegrating tablet 109m ODT q4 hours prn nausea/vomit 20 tablet 0  . ondansetron (ZOFRAN) 4 MG tablet Take 1 tablet (4 mg total) by mouth every 6 (six) hours as needed for nausea. 30 tablet 0  . oxyCODONE-acetaminophen (PERCOCET/ROXICET) 5-325 MG tablet Take 1-2 tablets by mouth every 6 (six) hours as needed for severe pain. 20 tablet 0  . polycarbophil (FIBERCON) 625 MG tablet Take 1 tablet (625 mg total) by mouth daily. 30 tablet 0   No current facility-administered medications for this visit.    PHYSICAL EXAMINATION: ECOG PERFORMANCE STATUS: 1 - Symptomatic but completely ambulatory  No vitals taken today, Exam not performed today   LABORATORY DATA:  I have reviewed the data as  listed CBC Latest Ref Rng & Units 05/01/2020 04/14/2020 03/24/2020  WBC 4.0 - 10.5 K/uL 4.6 7.8 6.8  Hemoglobin 12.0 - 15.0 g/dL 11.3(L) 10.1(L) 10.5(L)  Hematocrit 36.0 - 46.0 % 34.7(L) 31.4(L) 32.2(L)  Platelets 150 - 400 K/uL 128(L) 176 169     CMP Latest Ref Rng & Units 05/01/2020 04/14/2020 03/24/2020  Glucose 70 - 99 mg/dL 104(H) 116(H) 96  BUN 6 - 20 mg/dL 54(H) 29(H) 27(H)  Creatinine 0.44 - 1.00 mg/dL 1.28(H) 1.25(H) 1.11(H)  Sodium 135 - 145 mmol/L 136 138 137  Potassium 3.5 - 5.1 mmol/L 4.7 4.0 4.4  Chloride 98 - 111 mmol/L 110 112(H) 111  CO2 22 - 32 mmol/L 17(L) 19(L) 20(L)  Calcium 8.9 - 10.3 mg/dL 9.2 9.4 9.3  Total Protein 6.5 - 8.1 g/dL 7.2 7.2 7.5  Total Bilirubin 0.3 - 1.2 mg/dL 0.8 0.4 0.5  Alkaline Phos 38 - 126 U/L 58 89 94  AST 15 - 41 U/L 18 16 19  ALT 0 - 44 U/L 11 10 15       RADIOGRAPHIC STUDIES: I have personally reviewed the radiological images as listed and agreed with the findings in the report. No results found.   ASSESSMENT & PLAN:  Casey Black is a 60 y.o. female with    1. Adenocarcinoma of transverse colon, moderately differentiated, pT4aN1aM1a stage IV with liverand nodalmetastasis,MSS, KRAS G12S(+) -Shewas diagnosed in 01/2019 afteremergent colectomy and liver biopsy. Pathology showed stage IV colonic adenocarcinoma metastatic to liver.11/23/20PET showsknown liver metastasis and metastatic lymphadenopathy in chest and right axilla.  -Wepreviouslydiscussed her cancer is stage IV and no longer eligible for surgery. The likelihood of curing this is very lowdue to her diffuse metastasis, but still very treatable. -[name] shows MSI stable disease, Kras+. There are no targetable mutation, she is not a candidate for EGFRinhibitoror immunotherapy.Avastin was added with start of full dose FOLFOX3 weeks ago. -I started her on first-line5-FU for 2 cycles on 03/11/20 while her surgical wound healed. She started full  doseFOLFOXwith avastin q2weeks on 1/4/21to control her disease.Due to neuropathy Oxaliplatin was stopped after 09/24/19. She is now on maintenancetherapy. -For convenience Iswitched her maintenance therapy tooral chemo Xeloda2037mBID 2 weeks on/1 week offand Zirabev q3weeks starting 01/21/20.  -Treatment was held after COVID diagnosis on 05/01/20. She has recovered well after COVID treatment on 2/1. She is fine to restart Xeloda at same dose today. Will return to restart Zirabev in 3 weeks. She is agreeable.  -She will proceed with next scan next week. Virtual visit on 2/16 to review scan findings.   2. Chemotherapy induced peripheral neuropathy (CIPN), G2 -secondary to Oxaliplatin, developed after cycle 12 FOLFOX. Oxaliplatin d/c after C15 on 09/24/19 -Neuropathy in fingers and feet are moderate and stable with mild decreased function in fingers. -She is currently ongabapentin100 mg AM and 300 mg qHSand titrate up if needed. -I encouraged her to watch her balance and her driving.  3.Nutrition/Anorexia  -she reportedly weighed 253 lbs a few years ago, and weighed 203 lbs at symptom onset~11/2018 -Continue to f/u withdietician -She plans to get complete dentures soon. -She continues to gain weight back.  4. 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  5. HTN, uncontrolled -on hydralazine, isosorbide, lisinopril, metoprolol,amlodipineand HCTZ -Will monitor on bevacizumab.  6.Goals of care discussion,Social support -The patient understands the goal of care is palliative. -she is full code now -She worked in [employer] 2 different jobs, but has been out of work lately due to her symptoms  -She is not getting disability, no pay, but her employer kept her on their insurance plan -She may be eligible for  Medicaid  7. COVID (+) 05/01/20  -She tested positive for COVID on 05/01/20. She received treatment infusion on 05/06/20. Symptoms overall resolved after treatment.   PLAN: -Restart Xeloda 20074mBID 2 weeks on/1 week off today  -Lab, flush, CT CAP w contrast next week 2/14 -Virtual visit with me on 2/16 to review scan findings  -La, flush, F/u and Zirabev in 3 weeks.    No problem-specific Assessment & Plan notes found for this encounter.   No orders of the defined types were placed in this encounter.  I discussed the assessment and treatment plan with the patient. The patient was provided an opportunity to ask questions and all were answered. The patient agreed with the plan  and demonstrated an understanding of the instructions.  The patient was advised to call back or seek an in-person evaluation if the symptoms worsen or if the condition fails to improve as anticipated.  The total time spent in the appointment was 15 minutes.    Truitt Merle, MD 05/12/2020   I, Joslyn Devon, am acting as scribe for Truitt Merle, MD.   I have reviewed the above documentation for accuracy and completeness, and I agree with the above.

## 2020-05-12 ENCOUNTER — Telehealth: Payer: Self-pay | Admitting: Hematology

## 2020-05-12 ENCOUNTER — Encounter: Payer: Self-pay | Admitting: Hematology

## 2020-05-12 ENCOUNTER — Inpatient Hospital Stay: Payer: Medicaid Other | Attending: Nurse Practitioner | Admitting: Hematology

## 2020-05-12 DIAGNOSIS — C787 Secondary malignant neoplasm of liver and intrahepatic bile duct: Secondary | ICD-10-CM

## 2020-05-12 DIAGNOSIS — C189 Malignant neoplasm of colon, unspecified: Secondary | ICD-10-CM

## 2020-05-12 NOTE — Telephone Encounter (Signed)
Scheduled follow-up appointment per 2/7 los. Patient is aware. °

## 2020-05-14 NOTE — Telephone Encounter (Signed)
Dr. Burr Medico, For your approval or refusal. Gardiner Rhyme, RN

## 2020-05-16 NOTE — Progress Notes (Signed)
[place]   [phone] Fax:([phone]) [phone]   Clinic Follow up Note   Patient Care Team: [name], [name], MD as PCP - General (Internal Medicine) Clovis Riley, MD as Consulting Physician (General Surgery) Truitt Merle, MD as Consulting Physician (Hematology) [name], NP as Nurse Practitioner (Nurse Practitioner)   I connected with Casey Black on 05/21/2020 at 12:00 PM EST by telephone visit and verified that I am speaking with the correct person using two identifiers.  I discussed the limitations, risks, security and privacy concerns of performing an evaluation and management service by telephone and the availability of in person appointments. I also discussed with the patient that there may be a patient responsible charge related to this service. The patient expressed understanding and agreed to proceed.   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 Bowel-gas pattern consistent with small bowel obstruction. No free air. 2. No acute chest findings.   01/23/2019 Imaging   CT AP IMPRESSION: Obstructing mid transverse colonic mass with mild regional adenopathy and hepatic metastatic disease. The mass likely extends through the serosa; no ascites or peritoneal nodularity.   01/24/2019 Surgery   Surgeon: Clovis Riley MD Assistant: [name] Procedure performed: Transverse colectomy with  end colostomy, liver biopsy Procedure classification: URGENT/EMERGENT Preop diagnosis: Obstructing, metastatic transverse colon mass Post-op diagnosis/intraop findings: Same   01/24/2019 Pathology Results   FINAL MICROSCOPIC DIAGNOSIS:   A. COLON, TRANSVERSE, RESECTION:  Colonic adenocarcinoma, 5 cm.  Carcinoma extends into pericolonic connective tissue and focally to  serosal surface.  Margins not involved.  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   B. LIVER NODULE, LEFT, BIOPSY:  Metastatic adenocarcinoma.    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 Hypermetabolic metastatic disease in the liver and mediastinal/hilar/axillary lymph nodes. 2. Focal hypermetabolism in the rectum. Continued attention on follow-up exams is warranted. 3. Focal hypermetabolism medial to the right adrenal gland may be within a metastatic lymph node, better visualized on 01/23/2019. 4. Aortic atherosclerosis (ICD10-170.0). Coronary artery calcification.   03/12/2019 -  Chemotherapy   She started 5FU q2weeks on 03/12/19 for 2 cycles. She started full dose FOLFOX with Avastin on 04/09/19. Oxaliplatin dose reduced repeatedly due to neuropathy C12 and held since C16 on 10/09/19. Now on maintenance Avastin and 5FU q2weeks since 10/09/19       -Maintenance change to maintenance xeloda 2000 mg BID days 1-14 q21 days and q3 weeks Zirabev (15 mg/kg) starting 01/21/20. First cycle was taken 1080m BID due to misunderstanding.    05/31/2019 Imaging   Restaging CT CAP IMPRESSION: 1. Similar to mild interval decrease in size of multiple  hepatic lesions, partially calcified. 2. 2 mm right upper lobe pulmonary nodule. Recommend  attention on follow-up. 3. Emphysema and aortic atherosclerosis.   08/23/2019 Imaging   CT CAP w contrast  IMPRESSION: 1. The dominant peripheral right liver metastasis has mildly increased. Other smaller liver metastases are stable. 2. Otherwise no new or progressive metastatic disease in the chest, abdomen or pelvis. 3. Aortic Atherosclerosis (ICD10-I70.0) and Emphysema (ICD10-J43.9).   11/27/2019 Imaging   CT CAP w contrast  IMPRESSION: Status post transverse colectomy with right mid abdominal colostomy.   Mildly progressive hepatic metastases, as above.   No evidence of metastatic disease in the chest. Small mediastinal lymph nodes are within normal limits.   Additional stable ancillary findings as above.   02/21/2020 Imaging   IMPRESSION: 1. Stable hepatic metastatic disease. 2. Aortic atherosclerosis (ICD10-I70.0). Coronary artery calcification. 3.  Emphysema (ICD10-J43.9).   05/19/2020 Imaging   CT CAP  IMPRESSION: 1. Multiple partially calcified liver metastases are again noted. With the exception of a small lesion in segment 7/8 lesions are not significantly changed in the interval. No new liver lesions identified. 2. Coronary artery atherosclerotic calcifications. 3. Aortic atherosclerosis. 4. 2 mm right upper lobe lung nodule identified.  Unchanged.   Aortic Atherosclerosis (ICD10-I70.0).      CURRENT THERAPY:  Maintenance Avastin and 5FU q2weeks since 10/09/19, change to maintenance Xeloda 2000 mg BID days 1-14 q21 days and q3 weeks avastin (15 mg/kg) starting 01/21/20. First cycle was taken 1039m BID due to misunderstanding.Full treatment held after 04/15/20 due to [place] Restarted Xeloda on 05/13/20.   INTERVAL HISTORY:  Casey Black here for a follow up. She notes she is feeling well and has [place]. She notes she restarted Xeloda 20041mBID on 05/13/20 and tolerating well. She denies pain or nausea.   REVIEW OF  SYSTEMS:   Constitutional: Denies fevers, chills or abnormal weight loss (+) Fatigue  Eyes: Denies blurriness of vision Ears, nose, mouth, throat, and face: Denies mucositis or sore throat Respiratory: Denies cough, dyspnea or wheezes Cardiovascular: Denies palpitation, chest discomfort or lower extremity swelling Gastrointestinal:  Denies nausea, heartburn or change in bowel habits Skin: Denies abnormal skin rashes Lymphatics: Denies new lymphadenopathy or easy bruising Neurological:Denies numbness, tingling or new weaknesses Behavioral/Psych: Mood is stable, no new changes  All other systems were reviewed with the patient and are negative.  MEDICAL HISTORY:  Past Medical History:  Diagnosis Date  . Anemia    low iron  . [facility]   [facility]  . Colon cancer ([facility]  . Hypertension 01/23/2019  . Personal history of chemotherapy 01/2019   colon CA  . SBO (small bowel obstruction) ([facility]    SURGICAL HISTORY: Past Surgical History:  Procedure Laterality Date  . CESAREAN SECTION     x2  . COLOSTOMY N/A 01/24/2019   Procedure: End Loop Colostomy;  Surgeon: CoClovis RileyMD;  [place] [facility] Service: General;  Laterality: N/A;  . PARTIAL COLECTOMY N/A 01/24/2019   Procedure: PARTIAL COLECTOMY;  Surgeon: CoClovis RileyMD;  [place] Service: General;  Laterality: N/A;  . PORTACATH PLACEMENT Right 02/28/2019   Procedure: INSERTION PORT-A-CATH WITH ULTRASOUND GUIDANCE;  Surgeon: CoClovis RileyMD;  [place] [facility] Service: General;  Laterality: Right;    I have reviewed the social history and family history with the patient and they are unchanged from previous note.  ALLERGIES:  has No Known  Allergies.  MEDICATIONS:  Current Outpatient Medications  Medication Sig Dispense Refill  . acetaminophen (TYLENOL) 325 MG tablet Take 2 tablets (650 mg total) by mouth every 6 (six) hours as needed.    Marland Kitchen amLODipine (NORVASC) 10 MG tablet  Take 1 tablet (10 mg total) by mouth daily. 90 tablet 1  . benzonatate (TESSALON) 100 MG capsule Take 1 capsule (100 mg total) by mouth every 8 (eight) hours. 21 capsule 0  . capecitabine (XELODA) 500 MG tablet Take 4 tablets (2,000 mg total) by mouth 2 (two) times daily after a meal. Take for 14 days on, 7 days off, repeat every 21 days. 112 tablet 1  . ferrous sulfate 325 (65 FE) MG tablet TAKE 1 TABLET (325 MG TOTAL) BY MOUTH 2 (TWO) TIMES DAILY WITH A MEAL. 180 tablet 1  . gabapentin (NEURONTIN) 300 MG capsule Take 1 capsule (300 mg total) by mouth 2 (two) times daily. 60 capsule 3  . hydrALAZINE (APRESOLINE) 25 MG tablet TAKE 1 TABLET (25 MG TOTAL) BY MOUTH EVERY 8 (EIGHT) HOURS. 90 tablet 0  . hydrochlorothiazide (HYDRODIURIL) 12.5 MG tablet Take 1 tablet (12.5 mg total) by mouth daily. 30 tablet 1  . hydrocortisone (ANUSOL-HC) 2.5 % rectal cream Place 1 application rectally 2 (two) times daily. 30 g 0  . Ibuprofen (ADVIL) 200 MG CAPS Take 400 mg by mouth daily as needed (pain).    . isosorbide mononitrate (IMDUR) 30 MG 24 hr tablet Take 1 tablet (30 mg total) by mouth daily. 90 tablet 0  . lidocaine (LMX) 4 % cream Apply 1 application topically 3 (three) times daily as needed. 30 g 0  . lisinopril (ZESTRIL) 40 MG tablet TAKE 1 TABLET BY MOUTH EVERY DAY 90 tablet 1  . loperamide (IMODIUM) 2 MG capsule TAKE 1 CAPSULE (2 MG TOTAL) BY MOUTH AS NEEDED FOR DIARRHEA OR LOOSE STOOLS. 90 capsule 0  . metoprolol tartrate (LOPRESSOR) 50 MG tablet TAKE 1 TABLET BY MOUTH TWICE A DAY 180 tablet 1  . metroNIDAZOLE (FLAGYL) 250 MG tablet Take 1 tablet (250 mg total) by mouth 3 (three) times daily. 1 tablet 0  . ondansetron (ZOFRAN ODT) 4 MG disintegrating tablet 7m ODT q4 hours prn nausea/vomit 20 tablet 0  . ondansetron (ZOFRAN) 4 MG tablet Take 1 tablet (4 mg total) by mouth every 6 (six) hours as needed for nausea. 30 tablet 0  . oxyCODONE-acetaminophen (PERCOCET/ROXICET) 5-325 MG tablet Take 1-2 tablets  by mouth every 6 (six) hours as needed for severe pain. 20 tablet 0  . polycarbophil (FIBERCON) 625 MG tablet Take 1 tablet (625 mg total) by mouth daily. 30 tablet 0   No current facility-administered medications for this visit.    PHYSICAL EXAMINATION: ECOG PERFORMANCE STATUS: 1 - Symptomatic but completely ambulatory  No vitals taken today, Exam not performed today   LABORATORY DATA:  I have reviewed the data as listed CBC Latest Ref Rng & Units 05/01/2020 04/14/2020 03/24/2020  WBC 4.0 - 10.5 K/uL 4.6 7.8 6.8  Hemoglobin 12.0 - 15.0 g/dL 11.3(L) 10.1(L) 10.5(L)  Hematocrit 36.0 - 46.0 % 34.7(L) 31.4(L) 32.2(L)  Platelets 150 - 400 K/uL 128(L) 176 169     CMP Latest Ref Rng & Units 05/01/2020 04/14/2020 03/24/2020  Glucose 70 - 99 mg/dL 104(H) 116(H) 96  BUN 6 - 20 mg/dL 54(H) 29(H) 27(H)  Creatinine 0.44 - 1.00 mg/dL 1.28(H) 1.25(H) 1.11(H)  Sodium 135 - 145 mmol/L 136 138 137  Potassium 3.5 - 5.1 mmol/L 4.7 4.0  4.4  Chloride 98 - 111 mmol/L 110 112(H) 111  CO2 22 - 32 mmol/L 17(L) 19(L) 20(L)  Calcium 8.9 - 10.3 mg/dL 9.2 9.4 9.3  Total Protein 6.5 - 8.1 g/dL 7.2 7.2 7.5  Total Bilirubin 0.3 - 1.2 mg/dL 0.8 0.4 0.5  Alkaline Phos 38 - 126 U/L 58 89 94  AST 15 - 41 U/L 18 16 19   ALT 0 - 44 U/L 11 10 15       RADIOGRAPHIC STUDIES: I have personally reviewed the radiological images as listed and agreed with the findings in the report. No results found.   ASSESSMENT & PLAN:  Casey Black is a 60 y.o. female with   1. Adenocarcinoma of transverse colon, moderately differentiated, pT4aN1aM1a stage IV with liverand nodalmetastasis,MSS, KRAS G12S(+) -Shewas diagnosed in 01/2019 afteremergent colectomy and liver biopsy. Pathology showed stage IV colonic adenocarcinoma metastatic to liver.11/23/20PET showsknown liver metastasis and metastatic lymphadenopathy in chest and right axilla.  -Wepreviouslydiscussed her cancer is stage IV and no longer eligible for  surgery. The likelihood of curing this is very lowdue to her diffuse metastasis, but still very treatable. -[name] shows MSI stable disease, Kras+. There are no targetable mutation, she is not a candidate for EGFRinhibitoror immunotherapy.Avastin was added with start of full dose FOLFOX3 weeks ago. -I started her on first-line5-FU for 2 cycles on 03/11/20 while her surgical wound healed. She started full doseFOLFOXwith avastin q2weeks on 1/4/21to control her disease.Due to neuropathy Oxaliplatin was stopped after 09/24/19. She is now on maintenancetherapy -For convenience Iswitched her maintenance therapy tooral chemo Xeloda203mBID 2 weeks on/1 week offand Zirabev q3weeks starting 01/21/20.Treatment held 05/01/20-05/12/20 due to COVID infection.  -I personally reviewed and discussed her CT CAP from 05/19/20 which shows her liver metastasis overall stable. No new lesions. Will continue current therapy  -F/u week of 2/28 with start of next cycle Xeloda and Zirabev.   2. Chemotherapy induced peripheral neuropathy (CIPN), G2 -secondary to Oxaliplatin, developed after cycle 12 FOLFOX. Oxaliplatin d/c after C15 on 09/24/19 -Neuropathy in fingers and feet are moderate and stable with mild decreased function in fingers. -She is currently ongabapentin100 mg AM and 300 mg qHSand titrate up if needed. -I encouraged her to watch her balance and her driving.  3.Nutrition/Anorexia  -she reportedly weighed 253 lbs a few years ago, and weighed 203 lbs at symptom onset~11/2018 -Continue to f/u withdietician -She plans to get complete dentures soon. -She continues to gain weight back.  4. 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  5. HTN, uncontrolled -on hydralazine, isosorbide, lisinopril, metoprolol,amlodipineand  HCTZ -Will monitor on bevacizumab.  6.Goals of care discussion,Social support -The patient understands the goal of care is palliative. -she is full code now -She worked in [employer]at 2 different jobs, but has been out of work lately due to her symptoms  -She is not getting disability, no pay, but her employer kept her on their insurance plan -She may be eligible for Medicaid  7. COVID (+) 05/01/20  -She tested positive for COVID on 05/01/20. She received treatment infusion on 05/06/20. Symptoms overall resolved after treatment.   PLAN: -CT CAP reviewed, stable disease  -Continue current cycle Xeloda 20067mBID 2 weeks on/1 week (started 05/13/20) -Lab, flush, F/u and Zirabev in 2 and 5 weeks.    No problem-specific Assessment & Plan notes found for this  encounter.   No orders of the defined types were placed in this encounter.  I discussed the assessment and treatment plan with the patient. The patient was provided an opportunity to ask questions and all were answered. The patient agreed with the plan and demonstrated an understanding of the instructions.  The patient was advised to call back or seek an in-person evaluation if the symptoms worsen or if the condition fails to improve as anticipated.  The total time spent in the appointment was 25 minutes.    Truitt Merle, MD 05/21/2020   I, Joslyn Devon, am acting as scribe for Truitt Merle, MD.   I have reviewed the above documentation for accuracy and completeness, and I agree with the above.

## 2020-05-19 ENCOUNTER — Ambulatory Visit (HOSPITAL_COMMUNITY)
Admission: RE | Admit: 2020-05-19 | Discharge: 2020-05-19 | Disposition: A | Discharge status: Home / Self Care | Payer: Medicaid Other | Source: Ambulatory Visit | Attending: Nurse Practitioner | Admitting: Nurse Practitioner

## 2020-05-19 ENCOUNTER — Other Ambulatory Visit: Payer: Self-pay

## 2020-05-19 ENCOUNTER — Encounter (HOSPITAL_COMMUNITY): Payer: Self-pay

## 2020-05-19 DIAGNOSIS — C189 Malignant neoplasm of colon, unspecified: Secondary | ICD-10-CM | POA: Insufficient documentation

## 2020-05-19 DIAGNOSIS — C787 Secondary malignant neoplasm of liver and intrahepatic bile duct: Secondary | ICD-10-CM | POA: Insufficient documentation

## 2020-05-19 MED ORDER — HEPARIN SOD (PORK) LOCK FLUSH 100 UNIT/ML IV SOLN
500.0000 [IU] | Freq: Once | INTRAVENOUS | Status: AC
  Administered 2020-05-19: 500 [IU] via INTRAVENOUS

## 2020-05-19 MED ORDER — IOHEXOL 300 MG/ML  SOLN
100.0000 mL | Freq: Once | INTRAMUSCULAR | Status: AC | PRN
  Administered 2020-05-19: 100 mL via INTRAVENOUS

## 2020-05-19 MED ORDER — HEPARIN SOD (PORK) LOCK FLUSH 100 UNIT/ML IV SOLN
INTRAVENOUS | Status: AC
  Filled 2020-05-19: qty 5

## 2020-05-21 ENCOUNTER — Telehealth: Payer: Self-pay | Admitting: Internal Medicine

## 2020-05-21 ENCOUNTER — Encounter: Payer: Self-pay | Admitting: Hematology

## 2020-05-21 ENCOUNTER — Inpatient Hospital Stay (HOSPITAL_BASED_OUTPATIENT_CLINIC_OR_DEPARTMENT_OTHER): Payer: Medicaid Other | Admitting: Hematology

## 2020-05-21 DIAGNOSIS — I10 Essential (primary) hypertension: Secondary | ICD-10-CM

## 2020-05-21 DIAGNOSIS — D500 Iron deficiency anemia secondary to blood loss (chronic): Secondary | ICD-10-CM

## 2020-05-21 DIAGNOSIS — C787 Secondary malignant neoplasm of liver and intrahepatic bile duct: Secondary | ICD-10-CM

## 2020-05-21 DIAGNOSIS — C189 Malignant neoplasm of colon, unspecified: Secondary | ICD-10-CM | POA: Diagnosis not present

## 2020-05-21 MED ORDER — ISOSORBIDE MONONITRATE ER 30 MG PO TB24: 30.0000 mg | ORAL_TABLET | Freq: Every day | ORAL | 0 refills | Status: DC

## 2020-05-21 MED ORDER — HYDROCHLOROTHIAZIDE 12.5 MG PO TABS: 12.5000 mg | ORAL_TABLET | Freq: Every day | ORAL | 1 refills | Status: DC

## 2020-05-21 MED ORDER — CAPECITABINE 500 MG PO TABS: 2000.0000 mg | ORAL_TABLET | Freq: Two times a day (BID) | ORAL | 1 refills | Status: DC

## 2020-05-21 NOTE — Telephone Encounter (Signed)
Pt call and stated she need a refill on  isosorbide mononitrate (IMDUR) 30 MG 24 hr tablet sent to  [id number] - [place], [place] [facility] [address] Phone:  [phone]  Fax:  [phone]

## 2020-05-21 NOTE — Telephone Encounter (Signed)
Refill sent.

## 2020-05-22 ENCOUNTER — Telehealth: Payer: Self-pay | Admitting: Hematology

## 2020-05-22 NOTE — Telephone Encounter (Signed)
Checked out appointment. No LOS notes needing to be scheduled. No changes made. 

## 2020-06-03 ENCOUNTER — Telehealth: Payer: Self-pay

## 2020-06-03 NOTE — Telephone Encounter (Signed)
[facility] called requesting updated insurance information.  I provided insurance card information from card scan dated 04/14/2020.  She wanted to know if there was a pharmacy card and I could not find one.  I sent oral chemotherapy department to look into this matter.

## 2020-06-05 ENCOUNTER — Other Ambulatory Visit: Payer: Self-pay

## 2020-06-05 ENCOUNTER — Other Ambulatory Visit: Payer: Self-pay | Admitting: Hematology

## 2020-06-05 DIAGNOSIS — I10 Essential (primary) hypertension: Secondary | ICD-10-CM

## 2020-06-05 DIAGNOSIS — C189 Malignant neoplasm of colon, unspecified: Secondary | ICD-10-CM

## 2020-06-05 MED ORDER — CAPECITABINE 500 MG PO TABS: 2000.0000 mg | ORAL_TABLET | Freq: Two times a day (BID) | ORAL | 1 refills | Status: DC

## 2020-06-05 MED FILL — CAPECITABINE 500 MG TABLET: 500 | 21 days supply | Qty: 112 | Fill #0

## 2020-06-05 NOTE — Progress Notes (Signed)
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 Bowel-gas pattern consistent with small bowel obstruction. No free air. 2. No acute chest findings.   01/23/2019 Imaging   CT AP IMPRESSION: Obstructing mid transverse colonic mass with mild regional adenopathy and hepatic metastatic disease. The mass likely extends through the serosa; no ascites or peritoneal nodularity.   01/24/2019 Surgery   Surgeon: Clovis Riley MD Assistant: [name] Procedure performed: Transverse colectomy with end colostomy, liver biopsy Procedure classification: URGENT/EMERGENT Preop diagnosis: Obstructing, metastatic transverse colon mass Post-op diagnosis/intraop findings: Same   01/24/2019 Pathology Results   FINAL MICROSCOPIC DIAGNOSIS:   A. COLON, TRANSVERSE, RESECTION:  Colonic adenocarcinoma, 5 cm.  Carcinoma extends into pericolonic connective tissue and focally to  serosal surface.  Margins not involved.  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   B. LIVER NODULE, LEFT, BIOPSY:  Metastatic adenocarcinoma.    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 Hypermetabolic metastatic disease in the liver  and mediastinal/hilar/axillary lymph nodes. 2. Focal hypermetabolism in the rectum. Continued attention on follow-up exams is warranted. 3. Focal hypermetabolism medial to the right adrenal gland may be within a metastatic lymph node, better visualized on 01/23/2019. 4. Aortic atherosclerosis (ICD10-170.0). Coronary artery calcification.   03/12/2019 -  Chemotherapy   She started 5FU q2weeks on 03/12/19 for 2 cycles. She started full dose FOLFOX with Avastin on 04/09/19. Oxaliplatin dose reduced repeatedly due to neuropathy C12 and held since C16 on 10/09/19. Now on maintenance Avastin and 5FU q2weeks since 10/09/19       -Maintenance change to maintenance xeloda 2000 mg BID days 1-14 q21 days and q3 weeks Zirabev (15 mg/kg) starting 01/21/20. First cycle was taken 1082m BID due to misunderstanding.    05/31/2019 Imaging   Restaging CT CAP IMPRESSION: 1. Similar to mild interval decrease in size of multiple hepatic lesions, partially calcified. 2. 2 mm right upper lobe pulmonary nodule. Recommend attention on follow-up. 3. Emphysema and aortic atherosclerosis.   08/23/2019 Imaging   CT CAP w contrast  IMPRESSION: 1. The dominant peripheral right liver metastasis has mildly increased. Other smaller liver metastases are stable. 2. Otherwise no new or progressive metastatic disease in the chest, abdomen or pelvis. 3. Aortic Atherosclerosis (ICD10-I70.0) and Emphysema (ICD10-J43.9).   11/27/2019 Imaging   CT CAP w contrast  IMPRESSION: Status post transverse colectomy with right mid abdominal colostomy.   Mildly progressive hepatic metastases, as above.   No evidence of metastatic disease in the chest. Small mediastinal lymph nodes are within normal limits.   Additional stable ancillary findings as above.   02/21/2020 Imaging   IMPRESSION: 1. Stable hepatic metastatic disease. 2. Aortic atherosclerosis (ICD10-I70.0). Coronary artery calcification. 3.  Emphysema (ICD10-J43.9).    05/19/2020 Imaging  CT CAP  IMPRESSION: 1. Multiple partially calcified liver metastases are again noted. With the exception of a small lesion in segment 7/8 lesions are not significantly changed in the interval. No new liver lesions identified. 2. Coronary artery atherosclerotic calcifications. 3. Aortic atherosclerosis. 4. 2 mm right upper lobe lung nodule identified.  Unchanged.   Aortic Atherosclerosis (ICD10-I70.0).     CURRENT THERAPY: Maintenance Avastin and 5FU q2weeks since 10/09/19, change to maintenanceXeloda2000 mg BID days 1-14 q21 days and q3 weeks avastin (15 mg/kg) starting 01/21/20. First cycle was taken 1036m BID due to misunderstanding.Full treatment held after 04/15/20 due to [place] Restarted Xeloda on 05/13/20.   INTERVAL HISTORY: [name]89y.o. female returns to the clinic today for a follow up visit. The patient's treatment with Avastin and Xeloda was held at the end of January 2022 and early February 2022 due to the patient contracting COVID-19. She restarted her treatment with Xeloda on 05/13/20 and has been feeling fairly well since that time without any concerning complaints. Today, she denies any recent fevers, chills, night sweats, or weight loss. Denies signs or symptoms of infection. Rashes or skin changes except she notes some skin darkening on her palms and soles of her feet that sometimes peel. She denies pain. She notes she uses lotion and it does not bother her. She is requesting a referral to podiatry. She reports thick toe nails that are too long. Due to her prior chemotherapy induced neuropathy, she cannot feel well enough to cut her nails herself. Denies nausea, vomiting, diarrhea, or constipation. She denies significant fatigue. Denies abdominal pain. She started her current cycle of Xeloda a few days ago. She states she will start her off week on 06/19/20. She denies any abnormal bleeding or bruising. She is here for evaluation before starting her  next cycle of avastin.   MEDICAL HISTORY: Past Medical History:  Diagnosis Date  . Anemia    low iron  . Cancer ([facility]    colon cancer  . Colon cancer ([place] 01/[place]  . Hypertension 01/23/2019  . Personal history of chemotherapy 01/2019   colon CA  . SBO (small bowel obstruction) ([place] 01/23/2019    ALLERGIES:  has No Known Allergies.  MEDICATIONS:  Current Outpatient Medications  Medication Sig Dispense Refill  . acetaminophen (TYLENOL) 325 MG tablet Take 2 tablets (650 mg total) by mouth every 6 (six) hours as needed.    .Marland KitchenamLODipine (NORVASC) 10 MG tablet TAKE 1 TABLET BY MOUTH EVERY DAY 90 tablet 1  . benzonatate (TESSALON) 100 MG capsule Take 1 capsule (100 mg total) by mouth every 8 (eight) hours. 21 capsule 0  . capecitabine (XELODA) 500 MG tablet Take 4 tablets (2,000 mg total) by mouth 2 (two) times daily after a meal. Take for 14 days on, 7 days off, repeat every 21 days. 112 tablet 1  . ferrous sulfate 325 (65 FE) MG tablet TAKE 1 TABLET (325 MG TOTAL) BY MOUTH 2 (TWO) TIMES DAILY WITH A MEAL. 180 tablet 1  . gabapentin (NEURONTIN) 300 MG capsule Take 1 capsule (300 mg total) by mouth 2 (two) times daily. 60 capsule 3  . hydrALAZINE (APRESOLINE) 25 MG tablet TAKE 1 TABLET (25 MG TOTAL) BY MOUTH EVERY 8 (EIGHT) HOURS. 90 tablet 0  . hydrochlorothiazide (HYDRODIURIL) 12.5 MG tablet Take 1 tablet (12.5 mg total) by mouth daily. 30 tablet 1  . hydrocortisone (ANUSOL-HC) 2.5 % rectal cream Place 1 application rectally 2 (two) times daily. 30 g 0  .  Ibuprofen (ADVIL) 200 MG CAPS Take 400 mg by mouth daily as needed (pain).    . isosorbide mononitrate (IMDUR) 30 MG 24 hr tablet Take 1 tablet (30 mg total) by mouth daily. 90 tablet 0  . lidocaine (LMX) 4 % cream Apply 1 application topically 3 (three) times daily as needed. 30 g 0  . lisinopril (ZESTRIL) 40 MG tablet TAKE 1 TABLET BY MOUTH EVERY DAY 90 tablet 1  . loperamide (IMODIUM) 2 MG capsule TAKE 1 CAPSULE (2 MG TOTAL) BY  MOUTH AS NEEDED FOR DIARRHEA OR LOOSE STOOLS. 90 capsule 0  . metoprolol tartrate (LOPRESSOR) 50 MG tablet TAKE 1 TABLET BY MOUTH TWICE A DAY 180 tablet 1  . metroNIDAZOLE (FLAGYL) 250 MG tablet Take 1 tablet (250 mg total) by mouth 3 (three) times daily. 1 tablet 0  . ondansetron (ZOFRAN ODT) 4 MG disintegrating tablet 67m ODT q4 hours prn nausea/vomit 20 tablet 0  . ondansetron (ZOFRAN) 4 MG tablet Take 1 tablet (4 mg total) by mouth every 6 (six) hours as needed for nausea. 30 tablet 0  . oxyCODONE-acetaminophen (PERCOCET/ROXICET) 5-325 MG tablet Take 1-2 tablets by mouth every 6 (six) hours as needed for severe pain. 20 tablet 0  . polycarbophil (FIBERCON) 625 MG tablet Take 1 tablet (625 mg total) by mouth daily. 30 tablet 0   No current facility-administered medications for this visit.   Facility-Administered Medications Ordered in Other Visits  Medication Dose Route Frequency Provider Last Rate Last Admin  . sodium chloride flush (NS) 0.9 % injection 10 mL  10 mL Intracatheter PRN FTruitt Merle MD   10 mL at 06/06/20 1051    SURGICAL HISTORY:  Past Surgical History:  Procedure Laterality Date  . CESAREAN SECTION     x2  . COLOSTOMY N/A 01/24/2019   Procedure: End Loop Colostomy;  Surgeon: CClovis Riley MD;  [place]  Service: General;  Laterality: N/A;  . PARTIAL COLECTOMY N/A 01/24/2019   Procedure: PARTIAL COLECTOMY;  Surgeon: CClovis Riley MD;  [place]  Service: General;  Laterality: N/A;  . PORTACATH PLACEMENT Right 02/28/2019   Procedure: INSERTION PORT-A-CATH WITH ULTRASOUND GUIDANCE;  Surgeon: CClovis Riley MD;  [place] [facility]  Service: General;  Laterality: Right;    REVIEW OF SYSTEMS:   Review of Systems  Constitutional: Negative for appetite change, chills, fatigue, fever and unexpected weight change.  HENT:  Negative for mouth sores, nosebleeds, sore throat and trouble swallowing.   Eyes: Negative for eye problems and icterus.   Respiratory: Negative for cough, hemoptysis, shortness of breath and wheezing.   Cardiovascular: Negative for chest pain and leg swelling.  Gastrointestinal: Negative for abdominal pain, constipation, diarrhea, nausea and vomiting.  Genitourinary: Negative for bladder incontinence, difficulty urinating, dysuria, frequency and hematuria.   Musculoskeletal: Negative for back pain, gait problem, neck pain and neck stiffness.  Skin: Positive for skin darkening and mild peeling of the palms and soles.  Neurological: Positive for stable peripheral neuropathy. Negative for dizziness, extremity weakness, gait problem, headaches, light-headedness and seizures.  Hematological: Negative for adenopathy. Does not bruise/bleed easily.  Psychiatric/Behavioral: Negative for confusion, depression and sleep disturbance. The patient is not nervous/anxious.     PHYSICAL EXAMINATION:  Blood pressure (!) 146/60, pulse (!) 41, temperature (!) 96.6 F (35.9 C), temperature source Tympanic, resp. rate 19, height 5' 7" (1.702 m), weight 226 lb 4.8 oz (102.6 kg), SpO2 100 %.  ECOG PERFORMANCE STATUS: 1 - Symptomatic but completely ambulatory  Physical Exam  Constitutional: Oriented to person, place, and time and well-developed, well-nourished, and in no distress.  HENT:  Head: Normocephalic and atraumatic.  Mouth/Throat: Oropharynx is clear and moist. No oropharyngeal exudate.  Eyes: Conjunctivae are normal. Right eye exhibits no discharge. Left eye exhibits no discharge. No scleral icterus.  Neck: Normal range of motion. Neck supple.  Cardiovascular: Bradycardia, regular rhythm, normal heart sounds and intact distal pulses.   Pulmonary/Chest: Effort normal and breath sounds normal. No respiratory distress. No wheezes. No rales.  Abdominal: Soft. Bowel sounds are normal. Exhibits no distension and no mass. There is no tenderness. Colostomy bag in place.  Musculoskeletal: Normal range of motion. Exhibits no edema.   Lymphadenopathy:    No cervical adenopathy.  Neurological: Alert and oriented to person, place, and time. Exhibits normal muscle tone. Gait normal. Coordination normal.  Skin: Skin is warm and dry. Skin darkening on hands and feet. Thick long toe nails.  Not diaphoretic. No erythema. No pallor.  Psychiatric: Mood, memory and judgment normal.  Vitals reviewed.  LABORATORY DATA: Lab Results  Component Value Date   WBC 5.9 06/06/2020   HGB 10.1 (L) 06/06/2020   HCT 31.2 (L) 06/06/2020   MCV 103.7 (H) 06/06/2020   PLT 146 (L) 06/06/2020      Chemistry      Component Value Date/Time   NA 139 06/06/2020 1052   K 4.3 06/06/2020 1052   CL 113 (H) 06/06/2020 1052   CO2 19 (L) 06/06/2020 1052   BUN 28 (H) 06/06/2020 1052   CREATININE 1.02 (H) 06/06/2020 1052      Component Value Date/Time   CALCIUM 9.2 06/06/2020 1052   ALKPHOS 86 06/06/2020 1052   AST 20 06/06/2020 1052   ALT 13 06/06/2020 1052   BILITOT 0.5 06/06/2020 1052       RADIOGRAPHIC STUDIES:  CT CHEST ABDOMEN PELVIS W CONTRAST  Result Date: 05/19/2020 CLINICAL DATA:  Restaging colon cancer. EXAM: CT CHEST, ABDOMEN, AND PELVIS WITH CONTRAST TECHNIQUE: Multidetector CT imaging of the chest, abdomen and pelvis was performed following the standard protocol during bolus administration of intravenous contrast. CONTRAST:  175m OMNIPAQUE IOHEXOL 300 MG/ML  SOLN COMPARISON:  02/21/2020. FINDINGS: CT CHEST FINDINGS Cardiovascular: The heart size appears normal. Coronary artery atherosclerotic calcifications noted. No pericardial effusion. Mediastinum/Nodes: Calcified mediastinal lymph node noted. No thoracic adenopathy. The trachea appears patent and is midline. Normal appearance of the thyroid gland. Normal appearance of the esophagus. Lungs/Pleura: No pleural effusion. 2 mm right upper lobe lung nodule identified. Unchanged from previous exam, image 33/6. Subpleural scarring identified within the anterior right middle lobe. New  from previous exam and likely postinflammatory. No suspicious lung nodules. Musculoskeletal: There is spondylosis within the thoracic spine. No acute or suspicious osseous findings. CT ABDOMEN PELVIS FINDINGS Hepatobiliary: Multifocal partially calcified lobe liver metastases are again noted. The index lesion within segment 7 measures 2.9 x 2.9 by 3.1 cm (volume = 14 cm^3), image 52/2. Previously 3.2 x 2.7 by 3.2 cm (volume = 14 cm^3). Smaller lesion within segment 7/8 lesion measures 1.1 cm, image 51/2. Previously 0.7 cm. Remaining liver metastasis appear unchanged.  No new lesions. Gallbladder is unremarkable. Pancreas: Unremarkable. No pancreatic ductal dilatation or surrounding inflammatory changes. Spleen: Normal in size without focal abnormality. Adrenals/Urinary Tract: Normal appearance of the adrenal glands. No kidney mass or hydronephrosis. Urinary bladder is normal. Stomach/Bowel: Status post right lower quadrant colostomy. No bowel wall thickening, inflammation or distension. Vascular/Lymphatic: Mild aortic atherosclerosis. No aneurysm. No abdominopelvic  adenopathy. Reproductive: Uterus and bilateral adnexa are unremarkable. Other: No ascites or focal fluid collections. No suspicious peritoneal nodule. Musculoskeletal: Marked degenerative disc disease at L5-S1. No aggressive or acute osseous abnormalities. IMPRESSION: 1. Multiple partially calcified liver metastases are again noted. With the exception of a small lesion in segment 7/8 lesions are not significantly changed in the interval. No new liver lesions identified. 2. Coronary artery atherosclerotic calcifications. 3. Aortic atherosclerosis. 4. 2 mm right upper lobe lung nodule identified.  Unchanged. Aortic Atherosclerosis (ICD10-I70.0). Electronically Signed   By: Kerby Moors M.D.   On: 05/19/2020 10:17     ASSESSMENT/PLAN:  Tarryn Bogdan is a 60 y.o. female with   1. Adenocarcinoma of transverse colon, moderately differentiated,  pT4aN1aM1a stage IV with liverand nodalmetastasis,MSS, KRAS G12S(+) -Shewas diagnosed in 01/2019 afteremergent colectomy and liver biopsy. Pathology showed stage IV colonic adenocarcinoma metastatic to liver.11/23/20PET showsknown liver metastasis and metastatic lymphadenopathy in chest and right axilla.  -Dr. Elson Clan her cancer is stage IV and no longer eligible for surgery. The likelihood of curing this is very lowdue to her diffuse metastasis, but still very treatable. -[name] shows MSI stable disease, Kras+. There are no targetable mutation, she is not a candidate for EGFRinhibitoror immunotherapy.Avastin was added with start of full dose FOLFOX3 weeks ago. -Dr. Burr Medico started her on first-line5-FU for 2 cycles on 03/11/20 while her surgical wound healed. She started full doseFOLFOXwith avastin q2weeks on 1/4/21to control her disease.Due to neuropathy Oxaliplatin was stopped after 09/24/19. She is now on maintenancetherapy -For convenience, Dr. Yevette Edwards her maintenance therapy tooral chemo Xeloda2027mBID 2 weeks on/1 week offand Zirabev q3weeks starting 01/21/20.Treatment held 05/01/20-05/12/20 due to COVID infection.  -Dr. FBurr Medicopreviously personally reviewed and discussed her CT CAP from 05/19/20 which shows her liver metastasis overall stable. No new lesions. Will continue current therapy  -Labs were reviewed. Recommend that she proceed with treatment today as scheduled. CEA is still pending.  -F/u in 3 weeks with start of next cycle Xeloda and Zirabev.   2. Chemotherapy induced peripheral neuropathy (CIPN), G2 -secondary to Oxaliplatin, developed after cycle 12 FOLFOX. Oxaliplatin d/c after C15 on 09/24/19 -Neuropathy in fingers and feet are moderate and stable with mild decreased function in fingers. -She is currently ongabapentin100 mg AM and 300 mg qHSand titrate up if needed. -Dr. FBurr Medicopreviously encouraged her to watch her balance and  her driving. -Patient requesting referral to podiatry since she cannot feel well enough due to her neuropathy to take care of her toe nails.   3.Nutrition/Anorexia  -she reportedly weighed 253 lbs a few years ago, and weighed 203 lbs at symptom onset~11/2018 -Continue to f/u withdietician -She plans to get complete dentures soon. -She continues to gain weight back.  4. 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  5. HTN, uncontrolled -on hydralazine, isosorbide, lisinopril, metoprolol,amlodipineand HCTZ -Will monitor on bevacizumab.  6.Goals of care discussion,Social support -The patient understands the goal of care is palliative. -she is full code now -She worked in [employer] 2 different jobs, but has been out of work lately due to her symptoms  -She is not getting disability, no pay, but her employer kept her on their insurance plan -She may be eligible for Medicaid  7. COVID (+) 05/01/20  -She tested positive for COVID on 05/01/20. She received treatment infusion on 05/06/20. Symptoms overall resolved  after treatment.  PLAN: -Continue current cycle Xeloda 2027m BID 2 weeks on/1 week off -Lab, flush, F/u and Zirabev in 3 and 6 weeks.  -referral placed to podiatry    Orders Placed This Encounter  Procedures  . Ambulatory referral to Podiatry    Referral Priority:   Routine    Referral Type:   Consultation    Referral Reason:   Specialty Services Required    Requested Specialty:   Podiatry    Number of Visits Requested:   1     I spent 20-29 minutes in this encounter.   Cassandra L Heilingoetter, PA-C 06/06/20

## 2020-06-05 NOTE — Telephone Encounter (Signed)
amLODipine (NORVASC) 10 MG tablet  [id number] - [place], [place] [address] Phone:  [phone]  Fax:  [phone]

## 2020-06-06 ENCOUNTER — Inpatient Hospital Stay: Payer: Medicaid Other | Attending: Nurse Practitioner | Admitting: Physician Assistant

## 2020-06-06 ENCOUNTER — Ambulatory Visit: Payer: Medicaid Other

## 2020-06-06 ENCOUNTER — Inpatient Hospital Stay: Payer: Medicaid Other

## 2020-06-06 ENCOUNTER — Other Ambulatory Visit: Payer: Self-pay

## 2020-06-06 VITALS — BP 146/60 | HR 41 | Temp 96.6°F | Resp 19 | Ht 67.0 in | Wt 226.3 lb

## 2020-06-06 VITALS — BP 142/45

## 2020-06-06 DIAGNOSIS — C778 Secondary and unspecified malignant neoplasm of lymph nodes of multiple regions: Secondary | ICD-10-CM | POA: Diagnosis not present

## 2020-06-06 DIAGNOSIS — C189 Malignant neoplasm of colon, unspecified: Secondary | ICD-10-CM

## 2020-06-06 DIAGNOSIS — Z95828 Presence of other vascular implants and grafts: Secondary | ICD-10-CM

## 2020-06-06 DIAGNOSIS — G620 Drug-induced polyneuropathy: Secondary | ICD-10-CM

## 2020-06-06 DIAGNOSIS — C184 Malignant neoplasm of transverse colon: Secondary | ICD-10-CM | POA: Insufficient documentation

## 2020-06-06 DIAGNOSIS — T451X5A Adverse effect of antineoplastic and immunosuppressive drugs, initial encounter: Secondary | ICD-10-CM

## 2020-06-06 DIAGNOSIS — C787 Secondary malignant neoplasm of liver and intrahepatic bile duct: Secondary | ICD-10-CM | POA: Diagnosis not present

## 2020-06-06 DIAGNOSIS — Z5112 Encounter for antineoplastic immunotherapy: Secondary | ICD-10-CM | POA: Diagnosis not present

## 2020-06-06 LAB — CMP (CANCER CENTER ONLY)
ALT: 13 U/L (ref 0–44)
AST: 20 U/L (ref 15–41)
Albumin: 3.5 g/dL (ref 3.5–5.0)
Alkaline Phosphatase: 86 U/L (ref 38–126)
Anion gap: 7 (ref 5–15)
BUN: 28 mg/dL — ABNORMAL HIGH (ref 6–20)
CO2: 19 mmol/L — ABNORMAL LOW (ref 22–32)
Calcium: 9.2 mg/dL (ref 8.9–10.3)
Chloride: 113 mmol/L — ABNORMAL HIGH (ref 98–111)
Creatinine: 1.02 mg/dL — ABNORMAL HIGH (ref 0.44–1.00)
GFR, Estimated: >60 mL/min (ref >60)
Glucose, Bld: 91 mg/dL (ref 70–99)
Potassium: 4.3 mmol/L (ref 3.5–5.1)
Sodium: 139 mmol/L (ref 135–145)
Total Bilirubin: 0.5 mg/dL (ref 0.3–1.2)
Total Protein: 7.7 g/dL (ref 6.5–8.1)

## 2020-06-06 LAB — CBC WITH DIFFERENTIAL (CANCER CENTER ONLY)
Abs Immature Granulocytes: 0.01 10*3/uL (ref 0.00–0.07)
Basophils Absolute: 0.1 10*3/uL (ref 0.0–0.1)
Basophils Relative: 1 %
Eosinophils Absolute: 0.3 10*3/uL (ref 0.0–0.5)
Eosinophils Relative: 5 %
HCT: 31.2 % — ABNORMAL LOW (ref 36.0–46.0)
Hemoglobin: 10.1 g/dL — ABNORMAL LOW (ref 12.0–15.0)
Immature Granulocytes: 0 %
Lymphocytes Relative: 32 %
Lymphs Abs: 1.9 10*3/uL (ref 0.7–4.0)
MCH: 33.6 pg (ref 26.0–34.0)
MCHC: 32.4 g/dL (ref 30.0–36.0)
MCV: 103.7 fL — ABNORMAL HIGH (ref 80.0–100.0)
Monocytes Absolute: 0.6 10*3/uL (ref 0.1–1.0)
Monocytes Relative: 10 %
Neutro Abs: 3 10*3/uL (ref 1.7–7.7)
Neutrophils Relative %: 52 %
Platelet Count: 146 10*3/uL — ABNORMAL LOW (ref 150–400)
RBC: 3.01 MIL/uL — ABNORMAL LOW (ref 3.87–5.11)
RDW: 17.4 % — ABNORMAL HIGH (ref 11.5–15.5)
WBC Count: 5.9 10*3/uL (ref 4.0–10.5)
nRBC: 0 % (ref 0.0–0.2)

## 2020-06-06 LAB — CEA (IN HOUSE-CHCC): CEA (CHCC-In House): 240.88 ng/mL — ABNORMAL HIGH (ref 0.00–5.00)

## 2020-06-06 LAB — TOTAL PROTEIN, URINE DIPSTICK: Protein, ur: NEGATIVE mg/dL

## 2020-06-06 MED ORDER — SODIUM CHLORIDE 0.9% FLUSH
10.0000 mL | INTRAVENOUS | Status: AC | PRN
  Administered 2020-06-06: 10 mL
  Filled 2020-06-06: qty 10

## 2020-06-06 MED ORDER — SODIUM CHLORIDE 0.9 % IV SOLN
7.5000 mg/kg | Freq: Once | INTRAVENOUS | Status: AC
  Administered 2020-06-06: 700 mg via INTRAVENOUS
  Filled 2020-06-06: qty 16

## 2020-06-06 MED ORDER — SODIUM CHLORIDE 0.9% FLUSH
10.0000 mL | INTRAVENOUS | Status: DC | PRN
Start: 2020-06-06 — End: 2020-06-06
  Administered 2020-06-06: 10 mL
  Filled 2020-06-06: qty 10

## 2020-06-06 MED ORDER — SODIUM CHLORIDE 0.9 % IV SOLN
Freq: Once | INTRAVENOUS | Status: AC
  Filled 2020-06-06: qty 250

## 2020-06-06 MED ORDER — HEPARIN SOD (PORK) LOCK FLUSH 100 UNIT/ML IV SOLN
500.0000 [IU] | Freq: Once | INTRAVENOUS | Status: AC | PRN
  Administered 2020-06-06: 500 [IU]
  Filled 2020-06-06: qty 5

## 2020-06-06 NOTE — Patient Instructions (Signed)

## 2020-06-06 NOTE — Patient Instructions (Signed)
[facility] Discharge Instructions for Patients Receiving Chemotherapy  Today you received the following chemotherapy agents: Bevacizumab  To help prevent nausea and vomiting after your treatment, we encourage you to take your nausea medication as prescribed.    If you develop nausea and vomiting that is not controlled by your nausea medication, call the clinic.   BELOW ARE SYMPTOMS THAT SHOULD BE REPORTED IMMEDIATELY:  *FEVER GREATER THAN 100.5 F  *CHILLS WITH OR WITHOUT FEVER  NAUSEA AND VOMITING THAT IS NOT CONTROLLED WITH YOUR NAUSEA MEDICATION  *UNUSUAL SHORTNESS OF BREATH  *UNUSUAL BRUISING OR BLEEDING  TENDERNESS IN MOUTH AND THROAT WITH OR WITHOUT PRESENCE OF ULCERS  *URINARY PROBLEMS  *BOWEL PROBLEMS  UNUSUAL RASH Items with * indicate a potential emergency and should be followed up as soon as possible.  Feel free to call the clinic should you have any questions or concerns. The clinic phone number is [phone].  Please show the CHEMO ALERT CARD at check-in to the Emergency Department and triage nurse.   

## 2020-06-10 ENCOUNTER — Other Ambulatory Visit: Payer: Self-pay | Admitting: Hematology

## 2020-06-11 ENCOUNTER — Ambulatory Visit (INDEPENDENT_AMBULATORY_CARE_PROVIDER_SITE_OTHER): Payer: Self-pay | Admitting: Podiatry

## 2020-06-11 ENCOUNTER — Encounter: Payer: Self-pay | Admitting: Podiatry

## 2020-06-11 ENCOUNTER — Other Ambulatory Visit: Payer: Self-pay

## 2020-06-11 DIAGNOSIS — B351 Tinea unguium: Secondary | ICD-10-CM

## 2020-06-11 NOTE — Progress Notes (Signed)
This patient returns to my office for at risk foot care.  This patient requires this care by a professional since this patient will be at risk due to having neuropathy due to cancer treatment.  Patient is presently experiencing peeling in her bottom of both feet.  This patient is unable to cut nails herself since the patient cannot reach her nails.These nails are painful walking and wearing shoes.  This patient presents for at risk foot care today.  General Appearance  Alert, conversant and in no acute stress.  Vascular  Dorsalis pedis and posterior tibial  pulses are palpable  bilaterally.  Capillary return is within normal limits  bilaterally. Temperature is within normal limits  bilaterally.  Neurologic  Senn-Weinstein monofilament wire test diminished   bilaterally. Muscle power within normal limits bilaterally.  Nails Thick disfigured discolored nails with subungual debris  from hallux to fifth toes bilaterally. No evidence of bacterial infection or drainage bilaterally.  Orthopedic  No limitations of motion  feet .  No crepitus or effusions noted.  No bony pathology or digital deformities noted.  Skin  normotropic skin noted bilaterally.  No signs of infections or ulcers noted.  Asymptomatic porokeratosis sub 4th met left foot.  Desquamation noted plantar aspect feet  B/L.   Onychomycosis  Pain in right toes  Pain in left toes  Consent was obtained for treatment procedures.   Mechanical debridement of nails 1-5  bilaterally performed with a nail nipper.  Filed with dremel without incident.    Return office visit   3 months                   Told patient to return for periodic foot care and evaluation due to potential at risk complications.   Gardiner Barefoot DPM

## 2020-06-12 ENCOUNTER — Other Ambulatory Visit: Payer: Self-pay | Admitting: Hematology

## 2020-06-16 DIAGNOSIS — K56609 Unspecified intestinal obstruction, unspecified as to partial versus complete obstruction: Secondary | ICD-10-CM | POA: Diagnosis not present

## 2020-06-16 DIAGNOSIS — T8131XD Disruption of external operation (surgical) wound, not elsewhere classified, subsequent encounter: Secondary | ICD-10-CM | POA: Diagnosis not present

## 2020-06-16 DIAGNOSIS — S31109A Unspecified open wound of abdominal wall, unspecified quadrant without penetration into peritoneal cavity, initial encounter: Secondary | ICD-10-CM | POA: Diagnosis not present

## 2020-06-16 DIAGNOSIS — Z933 Colostomy status: Secondary | ICD-10-CM | POA: Diagnosis not present

## 2020-06-17 ENCOUNTER — Telehealth: Payer: Self-pay | Admitting: Hematology

## 2020-06-17 NOTE — Telephone Encounter (Signed)
Called and spoke with patient. Confirmed all appts  °

## 2020-06-19 ENCOUNTER — Other Ambulatory Visit: Payer: Medicaid Other

## 2020-06-19 ENCOUNTER — Other Ambulatory Visit: Payer: Self-pay | Admitting: Physician Assistant

## 2020-06-19 ENCOUNTER — Ambulatory Visit: Payer: Medicaid Other | Admitting: Physician Assistant

## 2020-06-20 ENCOUNTER — Ambulatory Visit (HOSPITAL_COMMUNITY): Payer: Medicaid Other

## 2020-06-20 ENCOUNTER — Ambulatory Visit: Payer: Medicaid Other | Admitting: Hematology

## 2020-06-22 ENCOUNTER — Other Ambulatory Visit: Payer: Self-pay | Admitting: Nurse Practitioner

## 2020-06-23 MED FILL — CAPECITABINE 500 MG TABLET: 500 | 21 days supply | Qty: 112 | Fill #1

## 2020-06-23 NOTE — Progress Notes (Signed)
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 Bowel-gas pattern consistent with small bowel obstruction. No free air. 2. No acute chest findings.   01/23/2019 Imaging   CT AP IMPRESSION: Obstructing mid transverse colonic mass with mild regional adenopathy and hepatic metastatic disease. The mass likely extends through the serosa; no ascites or peritoneal nodularity.   01/24/2019 Surgery   Surgeon: Clovis Riley MD Assistant: [name] Procedure performed: Transverse colectomy with end colostomy, liver biopsy Procedure classification: URGENT/EMERGENT Preop diagnosis: Obstructing, metastatic transverse colon mass Post-op diagnosis/intraop findings: Same   01/24/2019 Pathology Results   FINAL MICROSCOPIC DIAGNOSIS:   A. COLON, TRANSVERSE, RESECTION:  Colonic adenocarcinoma, 5 cm.  Carcinoma extends into pericolonic connective tissue and focally to  serosal surface.  Margins not involved.  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   B. LIVER NODULE, LEFT, BIOPSY:  Metastatic adenocarcinoma.    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 Hypermetabolic metastatic disease in the liver  and mediastinal/hilar/axillary lymph nodes. 2. Focal hypermetabolism in the rectum. Continued attention on follow-up exams is warranted. 3. Focal hypermetabolism medial to the right adrenal gland may be within a metastatic lymph node, better visualized on 01/23/2019. 4. Aortic atherosclerosis (ICD10-170.0). Coronary artery calcification.   03/12/2019 -  Chemotherapy   She started 5FU q2weeks on 03/12/19 for 2 cycles. She started full dose FOLFOX with Avastin on 04/09/19. Oxaliplatin dose reduced repeatedly due to neuropathy C12 and held since C16 on 10/09/19. Now on maintenance Avastin and 5FU q2weeks since 10/09/19       -Maintenance change to maintenance xeloda 2000 mg BID days 1-14 q21 days and q3 weeks Zirabev (15 mg/kg) starting 01/21/20. First cycle was taken 1049m BID due to misunderstanding.    05/31/2019 Imaging   Restaging CT CAP IMPRESSION: 1. Similar to mild interval decrease in size of multiple hepatic lesions, partially calcified. 2. 2 mm right upper lobe pulmonary nodule. Recommend attention on follow-up. 3. Emphysema and aortic atherosclerosis.   08/23/2019 Imaging   CT CAP w contrast  IMPRESSION: 1. The dominant peripheral right liver metastasis has mildly increased. Other smaller liver metastases are stable. 2. Otherwise no new or progressive metastatic disease in the chest, abdomen or pelvis. 3. Aortic Atherosclerosis (ICD10-I70.0) and Emphysema (ICD10-J43.9).   11/27/2019 Imaging   CT CAP w contrast  IMPRESSION: Status post transverse colectomy with right mid abdominal colostomy.   Mildly progressive hepatic metastases, as above.   No evidence of metastatic disease in the chest. Small mediastinal lymph nodes are within normal limits.   Additional stable ancillary findings as above.   02/21/2020 Imaging   IMPRESSION: 1. Stable hepatic metastatic disease. 2. Aortic atherosclerosis (ICD10-I70.0). Coronary artery calcification. 3.  Emphysema (ICD10-J43.9).    05/19/2020 Imaging  CT CAP  IMPRESSION: 1. Multiple partially calcified liver metastases are again noted. With the exception of a small lesion in segment 7/8 lesions are not significantly changed in the interval. No new liver lesions identified. 2. Coronary artery atherosclerotic calcifications. 3. Aortic atherosclerosis. 4. 2 mm right upper lobe lung nodule identified.  Unchanged.   Aortic Atherosclerosis (ICD10-I70.0).      CURRENT THERAPY: Maintenance Avastin and 5FU q2weeks since 10/09/19, change to maintenanceXeloda2000 mg BID days 1-14 q21 days and q3 weeks avastin (15 mg/kg) starting 01/21/20. First cycle was taken 1016m BID due to misunderstanding.Full treatment held after 04/15/20 due to [place] Restarted Xeloda on 05/13/20  INTERVAL HISTORY: Casey Vessels60y.o. female returns to the clinic today for a follow up visit. The patient is feeling "good" today without any concerning complaints. She notes that she feels fortunate that she has not felt sick with her current treatment.  Today, she denies any recent fevers, chills, night sweats, or weight loss. She states she has a good appetite. Denies signs or symptoms of infection. Rashes or skin changes except she notes some skin darkening on her palms and soles of her feet that sometimes peel. She denies pain. She notes she uses lotion and it does not bother her. At her last appointment, she requested a referral to podiatry for thick toe nails and difficulty cutting them due to neuropathy in her fingers due to her prior chemotherapy. She saw podiatry since her last appointment. Denies nausea, vomiting, diarrhea, or constipation. She denies significant fatigue. Denies abdominal pain. She started her current cycle of Xeloda a few days ago. She denies any abnormal bleeding or bruising. She is here for evaluation before starting her next cycle of avastin.   MEDICAL HISTORY: Past Medical History:  Diagnosis Date  . Anemia    low iron   . Cancer ([facility]    colon cancer  . Colon cancer ([place] 01/2019  . Hypertension 01/23/2019  . Personal history of chemotherapy 01/2019   colon CA  . S[place]) [place] 01/23/2019    ALLERGIES:  has No Known Allergies.  MEDICATIONS:  Current Outpatient Medications  Medication Sig Dispense Refill  . acetaminophen (TYLENOL) 325 MG tablet Take 2 tablets (650 mg total) by mouth every 6 (six) hours as needed.    .Marland KitchenamLODipine (NORVASC) 10 MG tablet TAKE 1 TABLET BY MOUTH EVERY DAY 90 tablet 1  . benzonatate (TESSALON) 100 MG capsule Take 1 capsule (100 mg total) by mouth every 8 (eight) hours. 21 capsule 0  . capecitabine (XELODA) 500 MG tablet Take 4 tablets (2,000 mg total) by mouth 2 (two) times daily after a meal. Take for 14 days on, 7 days off, repeat every 21 days. 112 tablet 1  . ferrous sulfate 325 (65 FE) MG tablet TAKE 1 TABLET (325 MG TOTAL) BY MOUTH 2 (TWO) TIMES DAILY WITH A MEAL. 180 tablet 1  . gabapentin (NEURONTIN) 300 MG capsule Take 1 capsule (300 mg total) by mouth 2 (two) times daily. 60 capsule 3  . hydrALAZINE (APRESOLINE) 25 MG tablet TAKE 1 TABLET (25 MG TOTAL) BY MOUTH EVERY 8 (EIGHT) HOURS. 90 tablet 0  . hydrochlorothiazide (HYDRODIURIL) 12.5 MG tablet TAKE 1 TABLET BY MOUTH EVERY DAY 30 tablet 1  . hydrocortisone (ANUSOL-HC) 2.5 % rectal cream Place 1 application rectally 2 (two) times daily. 30 g 0  . Ibuprofen 200 MG CAPS Take 400 mg by mouth daily as needed (pain).    . isosorbide mononitrate (  IMDUR) 30 MG 24 hr tablet Take 1 tablet (30 mg total) by mouth daily. 90 tablet 0  . lidocaine (LMX) 4 % cream Apply 1 application topically 3 (three) times daily as needed. 30 g 0  . lisinopril (ZESTRIL) 40 MG tablet TAKE 1 TABLET BY MOUTH EVERY DAY 90 tablet 1  . loperamide (IMODIUM) 2 MG capsule TAKE 1 CAPSULE (2 MG TOTAL) BY MOUTH AS NEEDED FOR DIARRHEA OR LOOSE STOOLS. 90 capsule 0  . metoprolol tartrate (LOPRESSOR) 50 MG tablet TAKE 1 TABLET BY MOUTH  TWICE A DAY 180 tablet 1  . metroNIDAZOLE (FLAGYL) 250 MG tablet Take 1 tablet (250 mg total) by mouth 3 (three) times daily. 1 tablet 0  . ondansetron (ZOFRAN ODT) 4 MG disintegrating tablet 41m ODT q4 hours prn nausea/vomit 20 tablet 0  . ondansetron (ZOFRAN) 4 MG tablet Take 1 tablet (4 mg total) by mouth every 6 (six) hours as needed for nausea. 30 tablet 0  . oxyCODONE-acetaminophen (PERCOCET/ROXICET) 5-325 MG tablet Take 1-2 tablets by mouth every 6 (six) hours as needed for severe pain. 20 tablet 0  . polycarbophil (FIBERCON) 625 MG tablet Take 1 tablet (625 mg total) by mouth daily. 30 tablet 0   No current facility-administered medications for this visit.   Facility-Administered Medications Ordered in Other Visits  Medication Dose Route Frequency Provider Last Rate Last Admin  . sodium chloride flush (NS) 0.9 % injection 10 mL  10 mL Intracatheter PRN FTruitt Merle MD   10 mL at 06/06/20 1051    SURGICAL HISTORY:  Past Surgical History:  Procedure Laterality Date  . CESAREAN SECTION     x2  . COLOSTOMY N/A 01/24/2019   Procedure: End Loop Colostomy;  Surgeon: CClovis Riley MD;  [facility]  Service: General;  Laterality: N/A;  . PARTIAL COLECTOMY N/A 01/24/2019   Procedure: PARTIAL COLECTOMY;  Surgeon: CClovis Riley MD;  [facility]  Service: General;  Laterality: N/A;  . PORTACATH PLACEMENT Right 02/28/2019   Procedure: INSERTION PORT-A-CATH WITH ULTRASOUND GUIDANCE;  Surgeon: CClovis Riley MD;  [place] [facility]  Service: General;  Laterality: Right;    REVIEW OF SYSTEMS:   Review of Systems  Constitutional: Negative for appetite change, chills, fatigue, fever and unexpected weight change.  HENT:  Negative for mouth sores, nosebleeds, sore throat and trouble swallowing.   Eyes: Negative for eye problems and icterus.  Respiratory: Negative for cough, hemoptysis, shortness of breath and wheezing.   Cardiovascular: Negative for chest pain and leg  swelling.  Gastrointestinal: Negative for abdominal pain, constipation, diarrhea, nausea and vomiting.  Genitourinary: Negative for bladder incontinence, difficulty urinating, dysuria, frequency and hematuria.   Musculoskeletal: Negative for back pain, gait problem, neck pain and neck stiffness.  Skin: Positive for skin darkening and mild peeling of the palms and soles.  Neurological: Positive for stable peripheral neuropathy. Negative for dizziness, extremity weakness, gait problem, headaches, light-headedness and seizures.  Hematological: Negative for adenopathy. Does not bruise/bleed easily.  Psychiatric/Behavioral: Negative for confusion, depression and sleep disturbance. The patient is not nervous/anxious.   PHYSICAL EXAMINATION:  Blood pressure (!) 128/50, pulse (!) 53, temperature (!) 97.3 F (36.3 C), temperature source Tympanic, resp. rate 16, height _0  (1.702 m), weight 227 lb 9.6 oz (103.2 kg), SpO2 99 %.  ECOG PERFORMANCE STATUS: 1 - Symptomatic but completely ambulatory  Physical Exam  Constitutional: Oriented to person, place, and time and well-developed, well-nourished, and in no distress.  HENT:  Head: Normocephalic  and atraumatic.  Mouth/Throat: Oropharynx is clear and moist. No oropharyngeal exudate.  Eyes: Conjunctivae are normal. Right eye exhibits no discharge. Left eye exhibits no discharge. No scleral icterus.  Neck: Normal range of motion. Neck supple.  Cardiovascular: Bradycardia, regular rhythm, normal heart sounds and intact distal pulses.   Pulmonary/Chest: Effort normal and breath sounds normal. No respiratory distress. No wheezes. No rales.  Abdominal: Soft. Bowel sounds are normal. Exhibits no distension and no mass. There is no tenderness. Colostomy bag in place.  Musculoskeletal: Normal range of motion. Exhibits no edema.  Lymphadenopathy:    No cervical adenopathy.  Neurological: Alert and oriented to person, place, and time. Exhibits normal muscle  tone. Gait normal. Coordination normal.  Skin: Skin is warm and dry. Skin darkening on hands and feet. Thick long toe nails.  Not diaphoretic. No erythema. No pallor.  Psychiatric: Mood, memory and judgment normal.  Vitals reviewed.  LABORATORY DATA: Lab Results  Component Value Date   WBC 6.7 06/27/2020   HGB 10.1 (L) 06/27/2020   HCT 31.4 (L) 06/27/2020   MCV 104.7 (H) 06/27/2020   PLT 149 (L) 06/27/2020      Chemistry      Component Value Date/Time   NA 138 06/27/2020 0840   K 4.2 06/27/2020 0840   CL 111 06/27/2020 0840   CO2 20 (L) 06/27/2020 0840   BUN 29 (H) 06/27/2020 0840   CREATININE 1.03 (H) 06/27/2020 0840      Component Value Date/Time   CALCIUM 9.0 06/27/2020 0840   ALKPHOS 96 06/27/2020 0840   AST 18 06/27/2020 0840   ALT 12 06/27/2020 0840   BILITOT 0.5 06/27/2020 0840       RADIOGRAPHIC STUDIES:  No results found.   ASSESSMENT/PLAN:  Casey Black a 60 y.o.femalewith   1. Adenocarcinoma of transverse colon, moderately differentiated, pT4aN1aM1a stage IV with liverand nodalmetastasis,MSS, KRAS G12S(+) -Shewas diagnosed in 01/2019 afteremergent colectomy and liver biopsy. Pathology showed stage IV colonic adenocarcinoma metastatic to liver.11/23/20PET showsknown liver metastasis and metastatic lymphadenopathy in chest and right axilla.  -Dr. Elson Clan her cancer is stage IV and no longer eligible for surgery. The likelihood of curing this is very lowdue to her diffuse metastasis, but still very treatable. -[name] shows MSI stable disease, Kras+. There are no targetable mutation, she is not a candidate for EGFRinhibitoror immunotherapy.Avastin was added with start of full dose FOLFOX3 weeks ago. -Dr. Burr Medico started her on first-line5-FU for 2 cycles on 03/11/20 while her surgical wound healed. She started full doseFOLFOXwith avastin q2weeks on 1/4/21to control her disease.Due to neuropathy Oxaliplatin was  stopped after 09/24/19. She is now on maintenancetherapy -For convenience, Dr. Yevette Edwards her maintenance therapy tooral chemo Xeloda2000mg BID 2 weeks on/1 week offand Zirabev q3weeks starting 01/21/20.Treatment held 05/01/20-05/12/20 due to COVID infection.  -Dr. Burr Medico previously personally reviewed anddiscussed her CT CAP from 05/19/20 which shows her liver metastasisoverallstable.No new lesions.Will continue current therapy.  -Labs were reviewed. Recommend that she proceed with treatment today as scheduled. CEA is still pending. Per a note from [facility] NP, they are expecting her next restating CT scan in April or May.  -F/u in 3 weeks with start of next cycle Xeloda and Zirabev.  2. Chemotherapy induced peripheral neuropathy (CIPN), G2 -secondary to Oxaliplatin, developed after cycle 12 FOLFOX. Oxaliplatin d/c after C15 on 09/24/19 -Neuropathy in fingers and feet are moderate and stable with mild decreased function in fingers. -She is currently ongabapentin100 mg AM and 300 mg qHSand titrate up if  needed. -Dr. Burr Medico previously encouraged her to watch her balance and her driving. -Patient requesting referral to podiatry since she cannot feel well enough due to her neuropathy to take care of her toe nails. Evaluated on 06/11/20 and plans to follow up again in 3 months.  3.Nutrition/Anorexia  -she reportedly weighed 253 lbs a few years ago, and weighed 203 lbs at symptom onset~11/2018 -Continue to f/u withdietician -She plans to get complete dentures soon. -She continues to gain weight back and reports a very strong appetite (06/27/20)  4. 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  5. HTN, uncontrolled -on hydralazine, isosorbide, lisinopril, metoprolol,amlodipineand HCTZ -Will monitor on bevacizumab.  6.Goals of care  discussion,Social support -The patient understands the goal of care is palliative. -she is full code now -She worked in [employer] at 2 different jobs, but has been out of work lately due to her symptoms  -She is not getting disability, no pay, but her employer kept her on their insurance plan -She may be eligible for Medicaid  7. COVID (+) 05/01/20  -She tested positive for COVID on 05/01/20. She received treatment infusion on 05/06/20. Symptoms overall resolved after treatment.  PLAN: -Continue current cycleXeloda 2051m BID 2 weeks on/1 weekoff -Lab, flush, F/u and Zirabev in3 and 6 weeks. -Continue with cycle #7 today as scheduled   No orders of the defined types were placed in this encounter.    I spent 20-29 minutes in this encounter.   Benz Vandenberghe L Anniah Glick, PA-C 06/27/20

## 2020-06-27 ENCOUNTER — Inpatient Hospital Stay (HOSPITAL_BASED_OUTPATIENT_CLINIC_OR_DEPARTMENT_OTHER): Payer: Medicaid Other | Admitting: Physician Assistant

## 2020-06-27 ENCOUNTER — Inpatient Hospital Stay: Payer: Medicaid Other

## 2020-06-27 ENCOUNTER — Encounter: Payer: Self-pay | Admitting: Physician Assistant

## 2020-06-27 ENCOUNTER — Other Ambulatory Visit: Payer: Self-pay

## 2020-06-27 VITALS — BP 128/50 | HR 53 | Temp 97.3°F | Resp 16 | Ht 67.0 in | Wt 227.6 lb

## 2020-06-27 DIAGNOSIS — C189 Malignant neoplasm of colon, unspecified: Secondary | ICD-10-CM | POA: Diagnosis not present

## 2020-06-27 DIAGNOSIS — C184 Malignant neoplasm of transverse colon: Secondary | ICD-10-CM | POA: Diagnosis not present

## 2020-06-27 DIAGNOSIS — Z5112 Encounter for antineoplastic immunotherapy: Secondary | ICD-10-CM | POA: Diagnosis not present

## 2020-06-27 DIAGNOSIS — C778 Secondary and unspecified malignant neoplasm of lymph nodes of multiple regions: Secondary | ICD-10-CM | POA: Diagnosis not present

## 2020-06-27 DIAGNOSIS — C787 Secondary malignant neoplasm of liver and intrahepatic bile duct: Secondary | ICD-10-CM

## 2020-06-27 DIAGNOSIS — Z95828 Presence of other vascular implants and grafts: Secondary | ICD-10-CM

## 2020-06-27 LAB — CBC WITH DIFFERENTIAL (CANCER CENTER ONLY)
Abs Immature Granulocytes: 0.01 10*3/uL (ref 0.00–0.07)
Basophils Absolute: 0 10*3/uL (ref 0.0–0.1)
Basophils Relative: 1 %
Eosinophils Absolute: 0.2 10*3/uL (ref 0.0–0.5)
Eosinophils Relative: 3 %
HCT: 31.4 % — ABNORMAL LOW (ref 36.0–46.0)
Hemoglobin: 10.1 g/dL — ABNORMAL LOW (ref 12.0–15.0)
Immature Granulocytes: 0 %
Lymphocytes Relative: 24 %
Lymphs Abs: 1.6 10*3/uL (ref 0.7–4.0)
MCH: 33.7 pg (ref 26.0–34.0)
MCHC: 32.2 g/dL (ref 30.0–36.0)
MCV: 104.7 fL — ABNORMAL HIGH (ref 80.0–100.0)
Monocytes Absolute: 0.4 10*3/uL (ref 0.1–1.0)
Monocytes Relative: 6 %
Neutro Abs: 4.4 10*3/uL (ref 1.7–7.7)
Neutrophils Relative %: 66 %
Platelet Count: 149 10*3/uL — ABNORMAL LOW (ref 150–400)
RBC: 3 MIL/uL — ABNORMAL LOW (ref 3.87–5.11)
RDW: 16.4 % — ABNORMAL HIGH (ref 11.5–15.5)
WBC Count: 6.7 10*3/uL (ref 4.0–10.5)
nRBC: 0 % (ref 0.0–0.2)

## 2020-06-27 LAB — CMP (CANCER CENTER ONLY)
ALT: 12 U/L (ref 0–44)
AST: 18 U/L (ref 15–41)
Albumin: 3.2 g/dL — ABNORMAL LOW (ref 3.5–5.0)
Alkaline Phosphatase: 96 U/L (ref 38–126)
Anion gap: 7 (ref 5–15)
BUN: 29 mg/dL — ABNORMAL HIGH (ref 6–20)
CO2: 20 mmol/L — ABNORMAL LOW (ref 22–32)
Calcium: 9 mg/dL (ref 8.9–10.3)
Chloride: 111 mmol/L (ref 98–111)
Creatinine: 1.03 mg/dL — ABNORMAL HIGH (ref 0.44–1.00)
GFR, Estimated: >60 mL/min (ref >60)
Glucose, Bld: 118 mg/dL — ABNORMAL HIGH (ref 70–99)
Potassium: 4.2 mmol/L (ref 3.5–5.1)
Sodium: 138 mmol/L (ref 135–145)
Total Bilirubin: 0.5 mg/dL (ref 0.3–1.2)
Total Protein: 7.4 g/dL (ref 6.5–8.1)

## 2020-06-27 LAB — CEA (IN HOUSE-CHCC): CEA (CHCC-In House): 182.15 ng/mL — ABNORMAL HIGH (ref 0.00–5.00)

## 2020-06-27 LAB — TOTAL PROTEIN, URINE DIPSTICK: Protein, ur: NEGATIVE mg/dL

## 2020-06-27 MED ORDER — HEPARIN SOD (PORK) LOCK FLUSH 100 UNIT/ML IV SOLN
500.0000 [IU] | Freq: Once | INTRAVENOUS | Status: AC | PRN
  Administered 2020-06-27: 500 [IU]
  Filled 2020-06-27: qty 5

## 2020-06-27 MED ORDER — SODIUM CHLORIDE 0.9% FLUSH
10.0000 mL | INTRAVENOUS | Status: DC | PRN
  Administered 2020-06-27: 10 mL
  Filled 2020-06-27: qty 10

## 2020-06-27 MED ORDER — SODIUM CHLORIDE 0.9 % IV SOLN
Freq: Once | INTRAVENOUS | Status: AC
  Filled 2020-06-27: qty 250

## 2020-06-27 MED ORDER — SODIUM CHLORIDE 0.9 % IV SOLN
7.5000 mg/kg | Freq: Once | INTRAVENOUS | Status: AC
  Administered 2020-06-27: 700 mg via INTRAVENOUS
  Filled 2020-06-27: qty 12

## 2020-06-27 NOTE — Patient Instructions (Signed)
[facility] Discharge Instructions for Patients Receiving Chemotherapy  Today you received the following chemotherapy agents: bevacizumab  To help prevent nausea and vomiting after your treatment, we encourage you to take your nausea medication as directed.   If you develop nausea and vomiting that is not controlled by your nausea medication, call the clinic.   BELOW ARE SYMPTOMS THAT SHOULD BE REPORTED IMMEDIATELY:  *FEVER GREATER THAN 100.5 F  *CHILLS WITH OR WITHOUT FEVER  NAUSEA AND VOMITING THAT IS NOT CONTROLLED WITH YOUR NAUSEA MEDICATION  *UNUSUAL SHORTNESS OF BREATH  *UNUSUAL BRUISING OR BLEEDING  TENDERNESS IN MOUTH AND THROAT WITH OR WITHOUT PRESENCE OF ULCERS  *URINARY PROBLEMS  *BOWEL PROBLEMS  UNUSUAL RASH Items with * indicate a potential emergency and should be followed up as soon as possible.  Feel free to call the clinic should you have any questions or concerns. The clinic phone number is [phone].  Please show the CHEMO ALERT CARD at check-in to the Emergency Department and triage nurse.   

## 2020-06-27 NOTE — Patient Instructions (Signed)
Implanted Port Insertion, Care After This sheet gives you information about how to care for yourself after your procedure. Your health care provider may also give you more specific instructions. If you have problems or questions, contact your health care provider. What can I expect after the procedure? After the procedure, it is common to have:  Discomfort at the port insertion site.  Bruising on the skin over the port. This should improve over 3-4 days. Follow these instructions at home: Port care  After your port is placed, you will get a manufacturer's information card. The card has information about your port. Keep this card with you at all times.  Take care of the port as told by your health care provider. Ask your health care provider if you or a family member can get training for taking care of the port at home. A home health care nurse may also take care of the port.  Make sure to remember what type of port you have. Incision care  Follow instructions from your health care provider about how to take care of your port insertion site. Make sure you: ? Wash your hands with soap and water before and after you change your bandage (dressing). If soap and water are not available, use hand sanitizer. ? Change your dressing as told by your health care provider. ? Leave stitches (sutures), skin glue, or adhesive strips in place. These skin closures may need to stay in place for 2 weeks or longer. If adhesive strip edges start to loosen and curl up, you may trim the loose edges. Do not remove adhesive strips completely unless your health care provider tells you to do that.  Check your port insertion site every day for signs of infection. Check for: ? Redness, swelling, or pain. ? Fluid or blood. ? Warmth. ? Pus or a bad smell.      Activity  Return to your normal activities as told by your health care provider. Ask your health care provider what activities are safe for you.  Do not  lift anything that is heavier than 10 lb (4.5 kg), or the limit that you are told, until your health care provider says that it is safe. General instructions  Take over-the-counter and prescription medicines only as told by your health care provider.  Do not take baths, swim, or use a hot tub until your health care provider approves. Ask your health care provider if you may take showers. You may only be allowed to take sponge baths.  Do not drive for 24 hours if you were given a sedative during your procedure.  Wear a medical alert bracelet in case of an emergency. This will tell any health care providers that you have a port.  Keep all follow-up visits as told by your health care provider. This is important. Contact a health care provider if:  You cannot flush your port with saline as directed, or you cannot draw blood from the port.  You have a fever or chills.  You have redness, swelling, or pain around your port insertion site.  You have fluid or blood coming from your port insertion site.  Your port insertion site feels warm to the touch.  You have pus or a bad smell coming from the port insertion site. Get help right away if:  You have chest pain or shortness of breath.  You have bleeding from your port that you cannot control. Summary  Take care of the port as told by your   health care provider. Keep the manufacturer's information card with you at all times.  Change your dressing as told by your health care provider.  Contact a health care provider if you have a fever or chills or if you have redness, swelling, or pain around your port insertion site.  Keep all follow-up visits as told by your health care provider. This information is not intended to replace advice given to you by your health care provider. Make sure you discuss any questions you have with your health care provider. Document Revised: 10/18/2017 Document Reviewed: 10/18/2017 Elsevier Patient Education   2021 Elsevier Inc.  

## 2020-06-30 ENCOUNTER — Telehealth: Payer: Self-pay

## 2020-06-30 ENCOUNTER — Other Ambulatory Visit: Payer: Self-pay | Admitting: Internal Medicine

## 2020-06-30 NOTE — Telephone Encounter (Signed)
error 

## 2020-06-30 NOTE — Telephone Encounter (Signed)
Spoke with pt per Adin Hector, NP regarding the pt's recent CEA. Pt verbalizes understanding and will call [facility] with further questions/concerns.

## 2020-06-30 NOTE — Telephone Encounter (Signed)
-----   Message from [name], NP sent at 06/27/2020 10:57 AM EDT ----- Please let her know CEA has improved, good news! Continue same plan.   Thanks, Regan Rakers, NP

## 2020-07-01 ENCOUNTER — Telehealth: Payer: Self-pay | Admitting: Internal Medicine

## 2020-07-01 MED ORDER — HYDROCHLOROTHIAZIDE 12.5 MG PO TABS: 12.5000 mg | ORAL_TABLET | Freq: Every day | ORAL | 1 refills | Status: DC

## 2020-07-01 NOTE — Telephone Encounter (Signed)
Patient is calling and requesting a refill for hydrochlorothiazide (HYDRODIURIL) 12.5 MG tablet to be sent to [id number] [address], [place], [place] [address]  Phone:  [phone] Fax:  [phone]  CB is [phone]

## 2020-07-01 NOTE — Telephone Encounter (Signed)
Refill sent.

## 2020-07-01 NOTE — Telephone Encounter (Signed)
Last filled by Truitt Merle, MD Okay to fill?

## 2020-07-01 NOTE — Telephone Encounter (Signed)
Ok

## 2020-07-03 ENCOUNTER — Other Ambulatory Visit (HOSPITAL_COMMUNITY): Payer: Self-pay

## 2020-07-11 ENCOUNTER — Other Ambulatory Visit: Payer: Medicaid Other

## 2020-07-11 ENCOUNTER — Ambulatory Visit: Payer: Medicaid Other

## 2020-07-11 ENCOUNTER — Ambulatory Visit: Payer: Medicaid Other | Admitting: Hematology

## 2020-07-14 NOTE — Progress Notes (Signed)
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 Bowel-gas pattern consistent with small bowel obstruction. No free air. 2. No acute chest findings.   01/23/2019 Imaging   CT AP IMPRESSION: Obstructing mid transverse colonic mass with mild regional adenopathy and hepatic metastatic disease. The mass likely extends through the serosa; no ascites or peritoneal nodularity.   01/24/2019 Surgery   Surgeon: Clovis Riley MD Assistant: [name] Procedure performed: Transverse colectomy with end colostomy, liver biopsy Procedure classification: URGENT/EMERGENT Preop diagnosis: Obstructing, metastatic transverse colon mass Post-op diagnosis/intraop findings: Same   01/24/2019 Pathology Results   FINAL MICROSCOPIC DIAGNOSIS:   A. COLON, TRANSVERSE, RESECTION:  Colonic adenocarcinoma, 5 cm.  Carcinoma extends into pericolonic connective tissue and focally to  serosal surface.  Margins not involved.  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   B. LIVER NODULE, LEFT, BIOPSY:  Metastatic adenocarcinoma.    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 Hypermetabolic metastatic disease in the liver and mediastinal/hilar/axillary lymph nodes. 2. Focal hypermetabolism in the rectum. Continued attention on follow-up exams is warranted. 3. Focal hypermetabolism medial to the right adrenal gland may be within a metastatic lymph node, better visualized on 01/23/2019. 4. Aortic atherosclerosis (ICD10-170.0). Coronary artery calcification.   03/12/2019 -  Chemotherapy   She started 5FU q2weeks on 03/12/19 for 2 cycles. She started full dose FOLFOX with Avastin on 04/09/19. Oxaliplatin dose reduced repeatedly due to neuropathy C12 and held since C16 on 10/09/19. Now on maintenance Avastin and 5FU q2weeks since 10/09/19       -Maintenance change to maintenance xeloda 2000 mg BID days 1-14 q21 days and q3 weeks Zirabev (15 mg/kg) starting 01/21/20. First cycle was taken 1043m BID due to misunderstanding.    05/31/2019 Imaging   Restaging CT CAP IMPRESSION: 1. Similar to mild interval decrease in size of multiple hepatic lesions, partially calcified. 2. 2 mm right upper lobe pulmonary nodule. Recommend attention on follow-up. 3. Emphysema and aortic atherosclerosis.   08/23/2019 Imaging   CT CAP w contrast  IMPRESSION: 1. The dominant peripheral right liver metastasis has mildly increased. Other smaller liver metastases are stable. 2. Otherwise no new or progressive metastatic disease in the chest, abdomen or pelvis. 3. Aortic Atherosclerosis (ICD10-I70.0) and Emphysema (ICD10-J43.9).   11/27/2019 Imaging   CT CAP w contrast  IMPRESSION: Status post transverse colectomy with right mid abdominal colostomy.   Mildly progressive hepatic metastases, as above.   No evidence of  metastatic disease in the chest.  Small mediastinal lymph nodes are within normal limits.   Additional stable ancillary findings as above.   02/21/2020 Imaging   IMPRESSION: 1. Stable hepatic metastatic disease. 2. Aortic atherosclerosis (ICD10-I70.0). Coronary artery calcification. 3.  Emphysema (ICD10-J43.9).   05/19/2020 Imaging   CT CAP  IMPRESSION: 1. Multiple partially calcified liver metastases are again noted. With the exception of a small lesion in segment 7/8 lesions are not significantly changed in the interval. No new liver lesions identified. 2. Coronary artery atherosclerotic calcifications. 3. Aortic atherosclerosis. 4. 2 mm right upper lobe lung nodule identified.  Unchanged.   Aortic Atherosclerosis (ICD10-I70.0).      CURRENT THERAPY:  Maintenance Avastin and 5FU q2weeks since 10/09/19, change to maintenanceXeloda2000 mg BID days 1-14 q21 days and q3 weeks avastin (15 mg/kg) starting 01/21/20. First cycle was taken 1019m BID due to misunderstanding.Full treatment held after 04/15/20 due to [place] Restarted Xeloda on 05/13/20.   INTERVAL HISTORY:  Casey Manolisis here for a follow up. She was last seen by me 2 months ago and seen by NP Cassie in interim. She presents to the clinic alone. She notes she is doing well. She denies major issues with last cycle chemo. She notes b/l heel pain, which is manageable. She has been taking Tylenol for this. She notes she is still on Xeloda 2006mBID. She notes this is tolerable to her.     REVIEW OF SYSTEMS:   Constitutional: Denies fevers, chills or abnormal weight loss Eyes: Denies blurriness of vision Ears, nose, mouth, throat, and face: Denies mucositis or sore throat Respiratory: Denies cough, dyspnea or wheezes Cardiovascular: Denies palpitation, chest discomfort or lower extremity swelling Gastrointestinal:  Denies nausea, heartburn or change in bowel habits Skin: Denies abnormal skin rashes MSK: (+) B/l heel pain Lymphatics: Denies new  lymphadenopathy or easy bruising Neurological:Denies numbness, tingling or new weaknesses Behavioral/Psych: Mood is stable, no new changes  All other systems were reviewed with the patient and are negative.  MEDICAL HISTORY:  Past Medical History:  Diagnosis Date  . Anemia    low iron  . [facility]   [facility]  . [facility] [place]  . Hypertension 01/23/2019  . Personal history of chemotherapy 01/2019   colon CA  . [place]    SURGICAL HISTORY: Past Surgical History:  Procedure Laterality Date  . CESAREAN SECTION     x2  . COLOSTOMY N/A 01/24/2019   Procedure: End Loop Colostomy;  Surgeon: CoClovis RileyMD;  [place]: General;  Laterality: N/A;  . PARTIAL COLECTOMY N/A 01/24/2019   Procedure: PARTIAL COLECTOMY;  Surgeon: CoClovis RileyMD;  [place] [facility]: General;  Laterality: N/A;  . PORTACATH PLACEMENT Right 02/28/2019   Procedure: INSERTION PORT-A-CATH WITH ULTRASOUND GUIDANCE;  Surgeon: CoClovis RileyMD;  [place] [facility] [place] Service: General;  Laterality: Right;    I have reviewed the social history and family history with the patient and they are unchanged from previous note.  ALLERGIES:  has No Known Allergies.  MEDICATIONS:  Current Outpatient Medications  Medication Sig Dispense Refill  . acetaminophen (TYLENOL) 325 MG tablet Take 2 tablets (650 mg total) by mouth every 6 (six) hours as needed.    . Marland KitchenmLODipine (NORVASC) 10 MG tablet TAKE 1 TABLET BY MOUTH EVERY DAY 90 tablet 1  . benzonatate (TESSALON) 100 MG capsule Take 1 capsule (100 mg total) by  mouth every 8 (eight) hours. 21 capsule 0  . capecitabine (XELODA) 500 MG tablet TAKE 4 TABLETS (2,000 MG TOTAL) BY MOUTH 2 (TWO) TIMES DAILY AFTER A MEAL. TAKE FOR 14 DAYS ON, 7 DAYS OFF, REPEAT EVERY 21 DAYS. 112 tablet 1  . ferrous sulfate 325 (65 FE) MG tablet TAKE 1 TABLET (325 MG TOTAL) BY MOUTH 2 (TWO) TIMES DAILY WITH A  MEAL. 180 tablet 1  . gabapentin (NEURONTIN) 300 MG capsule Take 1 capsule (300 mg total) by mouth 2 (two) times daily. 60 capsule 3  . hydrALAZINE (APRESOLINE) 25 MG tablet TAKE 1 TABLET (25 MG TOTAL) BY MOUTH EVERY 8 (EIGHT) HOURS. 90 tablet 0  . hydrochlorothiazide (HYDRODIURIL) 12.5 MG tablet Take 1 tablet (12.5 mg total) by mouth daily. 90 tablet 1  . hydrocortisone (ANUSOL-HC) 2.5 % rectal cream Place 1 application rectally 2 (two) times daily. 30 g 0  . Ibuprofen 200 MG CAPS Take 400 mg by mouth daily as needed (pain).    . isosorbide mononitrate (IMDUR) 30 MG 24 hr tablet Take 1 tablet (30 mg total) by mouth daily. 90 tablet 0  . lidocaine (LMX) 4 % cream Apply 1 application topically 3 (three) times daily as needed. 30 g 0  . lisinopril (ZESTRIL) 40 MG tablet TAKE 1 TABLET BY MOUTH EVERY DAY 90 tablet 1  . loperamide (IMODIUM) 2 MG capsule TAKE 1 CAPSULE (2 MG TOTAL) BY MOUTH AS NEEDED FOR DIARRHEA OR LOOSE STOOLS. 90 capsule 0  . metoprolol tartrate (LOPRESSOR) 50 MG tablet TAKE 1 TABLET BY MOUTH TWICE A DAY 180 tablet 1  . metroNIDAZOLE (FLAGYL) 250 MG tablet Take 1 tablet (250 mg total) by mouth 3 (three) times daily. 1 tablet 0  . ondansetron (ZOFRAN ODT) 4 MG disintegrating tablet 52m ODT q4 hours prn nausea/vomit 20 tablet 0  . ondansetron (ZOFRAN) 4 MG tablet Take 1 tablet (4 mg total) by mouth every 6 (six) hours as needed for nausea. 30 tablet 0  . oxyCODONE-acetaminophen (PERCOCET/ROXICET) 5-325 MG tablet Take 1-2 tablets by mouth every 6 (six) hours as needed for severe pain. 20 tablet 0  . polycarbophil (FIBERCON) 625 MG tablet Take 1 tablet (625 mg total) by mouth daily. 30 tablet 0   No current facility-administered medications for this visit.   Facility-Administered Medications Ordered in Other Visits  Medication Dose Route Frequency Provider Last Rate Last Admin  . sodium chloride flush (NS) 0.9 % injection 10 mL  10 mL Intracatheter PRN FTruitt Merle MD   10 mL at 06/06/20  1051    PHYSICAL EXAMINATION: ECOG PERFORMANCE STATUS: 1 - Symptomatic but completely ambulatory  Vitals:   07/18/20 0908  BP: (!) 135/49  Pulse: (!) 41  Resp: 20  Temp: (!) 97.4 F (36.3 C)  SpO2: 100%   Filed Weights   07/18/20 0908  Weight: 232 lb 11.2 oz (105.6 kg)    Due to COVID19 we will limit examination to appearance. Patient had no complaints.  GENERAL:alert, no distress and comfortable SKIN: skin color normal, no rashes or significant lesions except skin hyperpigmentation on hands and feet with dry skin  EYES: normal, Conjunctiva are pink and non-injected, sclera clear  NEURO: alert & oriented x 3 with fluent speech   LABORATORY DATA:  I have reviewed the data as listed CBC Latest Ref Rng & Units 07/18/2020 06/27/2020 06/06/2020  WBC 4.0 - 10.5 K/uL 5.3 6.7 5.9  Hemoglobin 12.0 - 15.0 g/dL 10.5(L) 10.1(L) 10.1(L)  Hematocrit 36.0 - 46.0 %  33.1(L) 31.4(L) 31.2(L)  Platelets 150 - 400 K/uL 151 149(L) 146(L)     CMP Latest Ref Rng & Units 07/18/2020 06/27/2020 06/06/2020  Glucose 70 - 99 mg/dL 117(H) 118(H) 91  BUN 6 - 20 mg/dL 27(H) 29(H) 28(H)  Creatinine 0.44 - 1.00 mg/dL 0.89 1.03(H) 1.02(H)  Sodium 135 - 145 mmol/L 139 138 139  Potassium 3.5 - 5.1 mmol/L 4.4 4.2 4.3  Chloride 98 - 111 mmol/L 112(H) 111 113(H)  CO2 22 - 32 mmol/L 19(L) 20(L) 19(L)  Calcium 8.9 - 10.3 mg/dL 9.3 9.0 9.2  Total Protein 6.5 - 8.1 g/dL 7.6 7.4 7.7  Total Bilirubin 0.3 - 1.2 mg/dL 0.5 0.5 0.5  Alkaline Phos 38 - 126 U/L 92 96 86  AST 15 - 41 U/L _0 ALT 0 - 44 U/L _1 RADIOGRAPHIC STUDIES: I have personally reviewed the radiological images as listed and agreed with the findings in the report. No results found.   ASSESSMENT & PLAN:  Casey Black is a 60 y.o. female with   1. Adenocarcinoma of transverse colon, moderately differentiated, pT4aN1aM1a stage IV with liverand nodalmetastasis,MSS, KRAS G12S(+) -Shewas diagnosed in 01/2019 afteremergent  colectomy and liver biopsy. Pathology showed stage IV colonic adenocarcinoma metastatic to liver.11/23/20PET showsknown liver metastasis and metastatic lymphadenopathy in chest and right axilla.  -Wepreviouslydiscussed her cancer is stage IV and no longer eligible for surgery. The likelihood of curing this is very lowdue to her diffuse metastasis, but still very treatable. -[name] shows MSI stable disease, Kras+. There are no targetable mutation, she is not a candidate for EGFRinhibitoror immunotherapy.Avastin was added with start of full dose FOLFOX3 weeks ago. -I started her on first-line5-FU for 2 cycles on 03/11/20 while her surgical wound healed. She started full doseFOLFOXwith avastin q2weeks on 1/4/21to control her disease.Due to neuropathy Oxaliplatin was stopped after 09/24/19. She is now on maintenancetherapy. -For convenience Iswitched her maintenance therapy tooral chemo Xeloda207mBID 2 weeks on/1 week offand Zirabev q3weeks starting 01/21/20.Treatment held 05/01/20-05/12/20 due to COVID infection.  -She continues to tolerate Xeloda well without major side effects. Labs reviewed and adequate to proceed with Zirabev today. Proceed with start of next cycle Xeloda on 07/15/20.  -Plan to scan her before 08/29/20 OV -F/u in 3 weeks.   2. Chemotherapy induced peripheral neuropathy (CIPN), G2 -secondary to Oxaliplatin, developed after cycle 12 FOLFOX. Oxaliplatin d/c after C15 on 09/24/19 -Neuropathy in fingers and feet are moderate and stable with mild decreased function in fingers. -She is currently ongabapentin100 mg AM and 300 mg qHSand titrate up if needed. -I encouraged her to watch her balance and her driving.  3.Nutrition/Anorexia  -she reportedly weighed 253 lbs a few years ago, and weighed 203 lbs at symptom onset~11/2018 -Continue to f/u withdietician -She plans to get complete dentures soon. -She continues to gain weight back.  4. 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  5. HTN, uncontrolled -on hydralazine, isosorbide, lisinopril, metoprolol,amlodipineand HCTZ -Will monitor on bevacizumab.  6.Goals of care discussion,Social support -The patient understands the goal of care is palliative. -she is full code now -She worked in [employer] 2 different jobs, but has been out of work lately due to her symptoms  -She is not getting disability, no pay, but her employer kept her on their insurance plan -She  may be eligible for Medicaid  7. COVID (+) 05/01/20  -She tested positive for COVID on 05/01/20. She received treatment infusion on 05/06/20. Symptoms overall resolved after treatment.  PLAN: -Labs reviewed and adequate to proceed with Zirabev today   -Continue current cycle Xeloda 2037m BID 2 weeks on/1 week (started 07/15/20) -Lab, flush, F/u and Zirabev in 3 and 6 weeks.  -CT CAP w contrast in 5-6 weeks.    No problem-specific Assessment & Plan notes found for this encounter.   Orders Placed This Encounter  Procedures  . CT CHEST ABDOMEN PELVIS W CONTRAST    Standing Status:   Future    Standing Expiration Date:   07/18/2021    Order Specific Question:   If indicated for the ordered procedure, I authorize the administration of contrast media per Radiology protocol    Answer:   Yes    Order Specific Question:   Is patient pregnant?    Answer:   No    Order Specific Question:   [facility]   Order Specific Question:   Release to patient    Answer:   Immediate    Order Specific Question:   Is Oral Contrast requested for this exam?    Answer:   Yes, Per Radiology protocol    Order Specific Question:   Reason for Exam (SYMPTOM  OR DIAGNOSIS REQUIRED)    Answer:   evaluate chemo response   All  questions were answered. The patient knows to call the clinic with any problems, questions or concerns. No barriers to learning was detected. The total time spent in the appointment was 30 minutes.     YTruitt Merle MD 07/18/2020   I, AJoslyn Devon am acting as scribe for YTruitt Merle MD.   I have reviewed the above documentation for accuracy and completeness, and I agree with the above.

## 2020-07-16 ENCOUNTER — Other Ambulatory Visit: Payer: Self-pay | Admitting: Hematology

## 2020-07-16 ENCOUNTER — Other Ambulatory Visit (HOSPITAL_COMMUNITY): Payer: Self-pay

## 2020-07-16 DIAGNOSIS — C189 Malignant neoplasm of colon, unspecified: Secondary | ICD-10-CM

## 2020-07-17 ENCOUNTER — Other Ambulatory Visit: Payer: Self-pay | Admitting: Hematology

## 2020-07-17 ENCOUNTER — Other Ambulatory Visit (HOSPITAL_COMMUNITY): Payer: Self-pay

## 2020-07-17 DIAGNOSIS — C189 Malignant neoplasm of colon, unspecified: Secondary | ICD-10-CM

## 2020-07-17 MED ORDER — CAPECITABINE 500 MG PO TABS
ORAL_TABLET | ORAL | 1 refills | Status: DC
  Filled 2020-07-17: qty 112, 21d supply, fill #0

## 2020-07-18 ENCOUNTER — Inpatient Hospital Stay: Payer: Medicaid Other

## 2020-07-18 ENCOUNTER — Inpatient Hospital Stay: Payer: Medicaid Other | Attending: Hematology | Admitting: Hematology

## 2020-07-18 ENCOUNTER — Other Ambulatory Visit: Payer: Medicaid Other

## 2020-07-18 ENCOUNTER — Other Ambulatory Visit: Payer: Self-pay

## 2020-07-18 ENCOUNTER — Other Ambulatory Visit (HOSPITAL_COMMUNITY): Payer: Self-pay

## 2020-07-18 VITALS — BP 135/49 | HR 41 | Temp 97.4°F | Resp 20 | Ht 67.0 in | Wt 232.7 lb

## 2020-07-18 DIAGNOSIS — C787 Secondary malignant neoplasm of liver and intrahepatic bile duct: Secondary | ICD-10-CM | POA: Diagnosis not present

## 2020-07-18 DIAGNOSIS — C184 Malignant neoplasm of transverse colon: Secondary | ICD-10-CM | POA: Insufficient documentation

## 2020-07-18 DIAGNOSIS — C773 Secondary and unspecified malignant neoplasm of axilla and upper limb lymph nodes: Secondary | ICD-10-CM | POA: Insufficient documentation

## 2020-07-18 DIAGNOSIS — Z5112 Encounter for antineoplastic immunotherapy: Secondary | ICD-10-CM | POA: Diagnosis not present

## 2020-07-18 DIAGNOSIS — Z95828 Presence of other vascular implants and grafts: Secondary | ICD-10-CM

## 2020-07-18 DIAGNOSIS — C189 Malignant neoplasm of colon, unspecified: Secondary | ICD-10-CM | POA: Diagnosis not present

## 2020-07-18 LAB — CMP (CANCER CENTER ONLY)
ALT: 12 U/L (ref 0–44)
AST: 22 U/L (ref 15–41)
Albumin: 3.3 g/dL — ABNORMAL LOW (ref 3.5–5.0)
Alkaline Phosphatase: 92 U/L (ref 38–126)
Anion gap: 8 (ref 5–15)
BUN: 27 mg/dL — ABNORMAL HIGH (ref 6–20)
CO2: 19 mmol/L — ABNORMAL LOW (ref 22–32)
Calcium: 9.3 mg/dL (ref 8.9–10.3)
Chloride: 112 mmol/L — ABNORMAL HIGH (ref 98–111)
Creatinine: 0.89 mg/dL (ref 0.44–1.00)
GFR, Estimated: >60 mL/min (ref >60)
Glucose, Bld: 117 mg/dL — ABNORMAL HIGH (ref 70–99)
Potassium: 4.4 mmol/L (ref 3.5–5.1)
Sodium: 139 mmol/L (ref 135–145)
Total Bilirubin: 0.5 mg/dL (ref 0.3–1.2)
Total Protein: 7.6 g/dL (ref 6.5–8.1)

## 2020-07-18 LAB — CBC WITH DIFFERENTIAL (CANCER CENTER ONLY)
Abs Immature Granulocytes: 0.01 10*3/uL (ref 0.00–0.07)
Basophils Absolute: 0 10*3/uL (ref 0.0–0.1)
Basophils Relative: 1 %
Eosinophils Absolute: 0.3 10*3/uL (ref 0.0–0.5)
Eosinophils Relative: 5 %
HCT: 33.1 % — ABNORMAL LOW (ref 36.0–46.0)
Hemoglobin: 10.5 g/dL — ABNORMAL LOW (ref 12.0–15.0)
Immature Granulocytes: 0 %
Lymphocytes Relative: 34 %
Lymphs Abs: 1.8 10*3/uL (ref 0.7–4.0)
MCH: 34.1 pg — ABNORMAL HIGH (ref 26.0–34.0)
MCHC: 31.7 g/dL (ref 30.0–36.0)
MCV: 107.5 fL — ABNORMAL HIGH (ref 80.0–100.0)
Monocytes Absolute: 0.4 10*3/uL (ref 0.1–1.0)
Monocytes Relative: 7 %
Neutro Abs: 2.8 10*3/uL (ref 1.7–7.7)
Neutrophils Relative %: 53 %
Platelet Count: 151 10*3/uL (ref 150–400)
RBC: 3.08 MIL/uL — ABNORMAL LOW (ref 3.87–5.11)
RDW: 17.5 % — ABNORMAL HIGH (ref 11.5–15.5)
WBC Count: 5.3 10*3/uL (ref 4.0–10.5)
nRBC: 0 % (ref 0.0–0.2)

## 2020-07-18 LAB — CEA (IN HOUSE-CHCC): CEA (CHCC-In House): 183.21 ng/mL — ABNORMAL HIGH (ref 0.00–5.00)

## 2020-07-18 LAB — TOTAL PROTEIN, URINE DIPSTICK: Protein, ur: NEGATIVE mg/dL

## 2020-07-18 MED ORDER — HEPARIN SOD (PORK) LOCK FLUSH 100 UNIT/ML IV SOLN
500.0000 [IU] | Freq: Once | INTRAVENOUS | Status: AC | PRN
  Administered 2020-07-18: 500 [IU]
  Filled 2020-07-18: qty 5

## 2020-07-18 MED ORDER — SODIUM CHLORIDE 0.9 % IV SOLN
Freq: Once | INTRAVENOUS | Status: AC
  Filled 2020-07-18: qty 250

## 2020-07-18 MED ORDER — SODIUM CHLORIDE 0.9% FLUSH
10.0000 mL | INTRAVENOUS | Status: DC | PRN
  Filled 2020-07-18: qty 10

## 2020-07-18 MED ORDER — SODIUM CHLORIDE 0.9 % IV SOLN
7.5000 mg/kg | Freq: Once | INTRAVENOUS | Status: AC
  Administered 2020-07-18: 800 mg via INTRAVENOUS
  Filled 2020-07-18: qty 32

## 2020-07-18 MED ORDER — SODIUM CHLORIDE 0.9% FLUSH
10.0000 mL | INTRAVENOUS | Status: DC | PRN
  Administered 2020-07-18: 10 mL
  Filled 2020-07-18: qty 10

## 2020-07-18 MED ORDER — SODIUM CHLORIDE 0.9 % IV SOLN: 7.5000 mg/kg | Freq: Once | INTRAVENOUS | Status: DC

## 2020-07-18 NOTE — Progress Notes (Signed)
Dr. Burr Medico aware pt's weight has increased.  At her request, will adj Bevacizumab dose.  Kennith Center, Pharm.D., CPP 07/18/2020@9 :55 AM

## 2020-07-18 NOTE — Patient Instructions (Signed)
[facility] Discharge Instructions for Patients Receiving Chemotherapy  Today you received the following chemotherapy agents: bevacizumab  To help prevent nausea and vomiting after your treatment, we encourage you to take your nausea medication as directed.   If you develop nausea and vomiting that is not controlled by your nausea medication, call the clinic.   BELOW ARE SYMPTOMS THAT SHOULD BE REPORTED IMMEDIATELY:  *FEVER GREATER THAN 100.5 F  *CHILLS WITH OR WITHOUT FEVER  NAUSEA AND VOMITING THAT IS NOT CONTROLLED WITH YOUR NAUSEA MEDICATION  *UNUSUAL SHORTNESS OF BREATH  *UNUSUAL BRUISING OR BLEEDING  TENDERNESS IN MOUTH AND THROAT WITH OR WITHOUT PRESENCE OF ULCERS  *URINARY PROBLEMS  *BOWEL PROBLEMS  UNUSUAL RASH Items with * indicate a potential emergency and should be followed up as soon as possible.  Feel free to call the clinic should you have any questions or concerns. The clinic phone number is [phone].  Please show the CHEMO ALERT CARD at check-in to the Emergency Department and triage nurse.   

## 2020-07-18 NOTE — Progress Notes (Signed)
Per Dr Burr Medico, ok to proceed without waiting on urine protein results. Patient was negative on 3/25.

## 2020-07-21 ENCOUNTER — Encounter: Payer: Self-pay | Admitting: Hematology

## 2020-07-25 ENCOUNTER — Telehealth: Payer: Self-pay | Admitting: Internal Medicine

## 2020-07-25 DIAGNOSIS — K56609 Unspecified intestinal obstruction, unspecified as to partial versus complete obstruction: Secondary | ICD-10-CM | POA: Diagnosis not present

## 2020-07-25 DIAGNOSIS — T8131XD Disruption of external operation (surgical) wound, not elsewhere classified, subsequent encounter: Secondary | ICD-10-CM | POA: Diagnosis not present

## 2020-07-25 DIAGNOSIS — S31109A Unspecified open wound of abdominal wall, unspecified quadrant without penetration into peritoneal cavity, initial encounter: Secondary | ICD-10-CM | POA: Diagnosis not present

## 2020-07-25 DIAGNOSIS — Z933 Colostomy status: Secondary | ICD-10-CM | POA: Diagnosis not present

## 2020-07-25 MED ORDER — HYDRALAZINE HCL 25 MG PO TABS
25.0000 mg | ORAL_TABLET | Freq: Three times a day (TID) | ORAL | 1 refills | Status: DC
Start: 2020-07-25 — End: 2020-08-19

## 2020-07-25 NOTE — Telephone Encounter (Signed)
Patient would like a refill for hydrALAZINE (APRESOLINE) 25 MG tablet  Send to:  [id number] - [place], [place]  [address] [place], [place] [address]  Phone:  [phone] Fax:  [phone]

## 2020-08-01 ENCOUNTER — Ambulatory Visit: Payer: Medicaid Other | Admitting: Hematology

## 2020-08-01 ENCOUNTER — Other Ambulatory Visit: Payer: Medicaid Other

## 2020-08-01 ENCOUNTER — Ambulatory Visit: Payer: Medicaid Other

## 2020-08-04 NOTE — Progress Notes (Signed)
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 Bowel-gas pattern consistent with small bowel obstruction. No free air. 2. No acute chest findings.   01/23/2019 Imaging   CT AP IMPRESSION: Obstructing mid transverse colonic mass with mild regional adenopathy and hepatic metastatic disease. The mass likely extends through the serosa; no ascites or peritoneal nodularity.   01/24/2019 Surgery   Surgeon: Clovis Riley MD Assistant: [name] Procedure performed: Transverse colectomy with end colostomy, liver biopsy Procedure classification: URGENT/EMERGENT Preop diagnosis: Obstructing, metastatic transverse colon mass Post-op diagnosis/intraop findings: Same   01/24/2019 Pathology Results   FINAL MICROSCOPIC DIAGNOSIS:   A. COLON, TRANSVERSE, RESECTION:  Colonic adenocarcinoma, 5 cm.  Carcinoma extends into pericolonic connective tissue and focally to  serosal surface.  Margins not involved.  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   B. LIVER NODULE, LEFT, BIOPSY:  Metastatic adenocarcinoma.    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 Hypermetabolic metastatic disease in the liver and mediastinal/hilar/axillary lymph nodes. 2. Focal hypermetabolism in the rectum. Continued attention on follow-up exams is warranted. 3. Focal hypermetabolism medial to the right adrenal gland may be within a metastatic lymph node, better visualized on 01/23/2019. 4. Aortic atherosclerosis (ICD10-170.0). Coronary artery calcification.   03/12/2019 -  Chemotherapy   She started 5FU q2weeks on 03/12/19 for 2 cycles. She started full dose FOLFOX with Avastin on 04/09/19. Oxaliplatin dose reduced repeatedly due to neuropathy C12 and held since C16 on 10/09/19. Now on maintenance Avastin and 5FU q2weeks since 10/09/19       -Maintenance change to maintenance xeloda 2000 mg BID days 1-14 q21 days and q3 weeks Zirabev (15 mg/kg) starting 01/21/20. First cycle was taken 1081m BID due to misunderstanding.    05/31/2019 Imaging   Restaging CT CAP IMPRESSION: 1. Similar to mild interval decrease in size of multiple hepatic lesions, partially calcified. 2. 2 mm right upper lobe pulmonary nodule. Recommend attention on follow-up. 3. Emphysema and aortic atherosclerosis.   08/23/2019 Imaging   CT CAP w contrast  IMPRESSION: 1. The dominant peripheral right liver metastasis has mildly increased. Other smaller liver metastases are stable. 2. Otherwise no new or progressive metastatic disease in the chest, abdomen or pelvis. 3. Aortic Atherosclerosis (ICD10-I70.0) and Emphysema (ICD10-J43.9).   11/27/2019 Imaging   CT CAP w contrast  IMPRESSION: Status post transverse colectomy with right mid abdominal colostomy.   Mildly progressive hepatic metastases, as above.   No evidence of metastatic  disease in the chest.  Small mediastinal lymph nodes are within normal limits.   Additional stable ancillary findings as above.   02/21/2020 Imaging   IMPRESSION: 1. Stable hepatic metastatic disease. 2. Aortic atherosclerosis (ICD10-I70.0). Coronary artery calcification. 3.  Emphysema (ICD10-J43.9).   05/19/2020 Imaging   CT CAP  IMPRESSION: 1. Multiple partially calcified liver metastases are again noted. With the exception of a small lesion in segment 7/8 lesions are not significantly changed in the interval. No new liver lesions identified. 2. Coronary artery atherosclerotic calcifications. 3. Aortic atherosclerosis. 4. 2 mm right upper lobe lung nodule identified.  Unchanged.   Aortic Atherosclerosis (ICD10-I70.0).      CURRENT THERAPY:  Maintenance Avastin and 5FU q2weeks since 10/09/19, change to maintenanceXeloda2000 mg BID days 1-14 q21 days and q3 weeks avastin (15 mg/kg) starting 01/21/20. First cycle was taken 1010m BID due to misunderstanding.Full treatment held after 04/15/20 due to [place] Restarted Xeloda on 05/13/20.  INTERVAL HISTORY:  Casey Black here for a follow up. She was last seen by me 07/18/20. She presents to the clinic alone. She is doing well, tolerating Xeloda very well except skin hyperpigmentation.  Mild numbness and tingling on fingers, no impact on functions, she is on garbapentin, which helps.  She has good appetite and energy level. Overall doing well at home.  All other systems were reviewed with the patient and are negative.  MEDICAL HISTORY:  Past Medical History:  Diagnosis Date  . Anemia    low iron  . Cancer ([facility]    colon cancer  . [place]  . Hypertension 01/23/2019  . Personal history of chemotherapy 01/2019   colon CA  . SBO (small bowel obstruction) ([place] 01/23/2019    SURGICAL HISTORY: Past Surgical History:  Procedure Laterality Date  . CESAREAN SECTION     x2  . COLOSTOMY N/A 01/24/2019   Procedure: End Loop  Colostomy;  Surgeon: CClovis Riley MD;  [place]  Service: General;  Laterality: N/A;  . PARTIAL COLECTOMY N/A 01/24/2019   Procedure: PARTIAL COLECTOMY;  Surgeon: CClovis Riley MD;  [place] [facility]  Service: General;  Laterality: N/A;  . PORTACATH PLACEMENT Right 02/28/2019   Procedure: INSERTION PORT-A-CATH WITH ULTRASOUND GUIDANCE;  Surgeon: CClovis Riley MD;  [place] [facility]  Service: General;  Laterality: Right;    I have reviewed the social history and family history with the patient and they are unchanged from previous note.  ALLERGIES:  has No Known Allergies.  MEDICATIONS:  Current Outpatient Medications  Medication Sig Dispense Refill  . acetaminophen (TYLENOL) 325 MG tablet Take 2 tablets (650 mg total) by mouth every 6 (six) hours as needed.    .Marland KitchenamLODipine (NORVASC) 10 MG tablet TAKE 1 TABLET BY MOUTH EVERY DAY 90 tablet 1  . benzonatate (TESSALON) 100 MG capsule Take 1 capsule (100 mg total) by mouth every 8 (eight) hours. 21 capsule 0  . capecitabine (XELODA) 500 MG tablet TAKE 4 TABLETS (2,000 MG TOTAL) BY MOUTH 2 (TWO) TIMES DAILY AFTER A MEAL. TAKE FOR 14 DAYS ON, 7 DAYS OFF, REPEAT EVERY 21 DAYS. 49 tablet 0  . ferrous sulfate 325 (65 FE) MG tablet TAKE 1 TABLET (325 MG TOTAL) BY MOUTH 2 (TWO) TIMES DAILY WITH A MEAL. 180 tablet 1  . gabapentin (NEURONTIN) 300 MG capsule Take 1 capsule (300 mg total) by mouth 2 (two) times daily. 60 capsule 3  . hydrALAZINE (APRESOLINE) 25 MG tablet Take 1  tablet (25 mg total) by mouth every 8 (eight) hours. 90 tablet 1  . hydrochlorothiazide (HYDRODIURIL) 12.5 MG tablet Take 1 tablet (12.5 mg total) by mouth daily. 90 tablet 1  . hydrocortisone (ANUSOL-HC) 2.5 % rectal cream Place 1 application rectally 2 (two) times daily. 30 g 0  . Ibuprofen 200 MG CAPS Take 400 mg by mouth daily as needed (pain).    . isosorbide mononitrate (IMDUR) 30 MG 24 hr tablet Take 1 tablet (30 mg total) by mouth daily. 90 tablet 0  .  lidocaine (LMX) 4 % cream Apply 1 application topically 3 (three) times daily as needed. 30 g 0  . lisinopril (ZESTRIL) 40 MG tablet TAKE 1 TABLET BY MOUTH EVERY DAY 90 tablet 1  . loperamide (IMODIUM) 2 MG capsule TAKE 1 CAPSULE (2 MG TOTAL) BY MOUTH AS NEEDED FOR DIARRHEA OR LOOSE STOOLS. 90 capsule 0  . metoprolol tartrate (LOPRESSOR) 50 MG tablet TAKE 1 TABLET BY MOUTH TWICE A DAY 180 tablet 1  . metroNIDAZOLE (FLAGYL) 250 MG tablet Take 1 tablet (250 mg total) by mouth 3 (three) times daily. 1 tablet 0  . ondansetron (ZOFRAN ODT) 4 MG disintegrating tablet 66m ODT q4 hours prn nausea/vomit 20 tablet 0  . ondansetron (ZOFRAN) 4 MG tablet Take 1 tablet (4 mg total) by mouth every 6 (six) hours as needed for nausea. 30 tablet 0  . oxyCODONE-acetaminophen (PERCOCET/ROXICET) 5-325 MG tablet Take 1-2 tablets by mouth every 6 (six) hours as needed for severe pain. 20 tablet 0  . polycarbophil (FIBERCON) 625 MG tablet Take 1 tablet (625 mg total) by mouth daily. 30 tablet 0   No current facility-administered medications for this visit.   Facility-Administered Medications Ordered in Other Visits  Medication Dose Route Frequency Provider Last Rate Last Admin  . sodium chloride flush (NS) 0.9 % injection 10 mL  10 mL Intracatheter PRN FTruitt Merle MD   10 mL at 06/06/20 1051    PHYSICAL EXAMINATION: ECOG PERFORMANCE STATUS: 0  Vitals:   08/08/20 0932  BP: (!) 146/61  Pulse: (!) 51  Resp: 18  Temp: (!) 96.6 F (35.9 C)  SpO2: 100%   Filed Weights   08/08/20 0932  Weight: 233 lb 6.4 oz (105.9 kg)    GENERAL:alert, no distress and comfortable SKIN: skin color, texture, turgor are normal, no rashes or significant lesions except skin hyperpigmentation in hands  EYES: normal, Conjunctiva are pink and non-injected, sclera clear NECK: supple, thyroid normal size, non-tender, without nodularity LYMPH:  no palpable lymphadenopathy in the cervical, axillary  LUNGS: clear to auscultation and  percussion with normal breathing effort HEART: regular rate & rhythm and no murmurs and no lower extremity edema ABDOMEN:abdomen soft, non-tender and normal bowel sounds Musculoskeletal:no cyanosis of digits and no clubbing  NEURO: alert & oriented x 3 with fluent speech, no focal motor/sensory deficits  LABORATORY DATA:  I have reviewed the data as listed CBC Latest Ref Rng & Units 08/08/2020 07/18/2020 06/27/2020  WBC 4.0 - 10.5 K/uL 5.8 5.3 6.7  Hemoglobin 12.0 - 15.0 g/dL 10.2(L) 10.5(L) 10.1(L)  Hematocrit 36.0 - 46.0 % 30.9(L) 33.1(L) 31.4(L)  Platelets 150 - 400 K/uL 157 151 149(L)     CMP Latest Ref Rng & Units 08/08/2020 07/18/2020 06/27/2020  Glucose 70 - 99 mg/dL 86 117(H) 118(H)  BUN 6 - 20 mg/dL 33(H) 27(H) 29(H)  Creatinine 0.44 - 1.00 mg/dL 1.11(H) 0.89 1.03(H)  Sodium 135 - 145 mmol/L 137 139 138  Potassium 3.5 -  5.1 mmol/L 4.0 4.4 4.2  Chloride 98 - 111 mmol/L 111 112(H) 111  CO2 22 - 32 mmol/L 19(L) 19(L) 20(L)  Calcium 8.9 - 10.3 mg/dL 9.0 9.3 9.0  Total Protein 6.5 - 8.1 g/dL 7.6 7.6 7.4  Total Bilirubin 0.3 - 1.2 mg/dL 0.5 0.5 0.5  Alkaline Phos 38 - 126 U/L 84 92 96  AST 15 - 41 U/L 19 22 18   ALT 0 - 44 U/L 10 12 12       RADIOGRAPHIC STUDIES: I have personally reviewed the radiological images as listed and agreed with the findings in the report. No results found.   ASSESSMENT & PLAN:  Casey Black is a 60 y.o. female with   1. Adenocarcinoma of transverse colon, moderately differentiated, pT4aN1aM1a stage IV with liverand nodalmetastasis,MSS, KRAS G12S(+) -Shewas diagnosed in 01/2019 afteremergent colectomy and liver biopsy. Pathology showed stage IV colonic adenocarcinoma metastatic to liver.11/23/20PET showsknown liver metastasis and metastatic lymphadenopathy in chest and right axilla.  -Wepreviouslydiscussed her cancer is stage IV and no longer eligible for surgery. The likelihood of curing this is very lowdue to her diffuse metastasis, but  still very treatable. -[name] shows MSI stable disease, Kras+. There are no targetable mutation, she is not a candidate for EGFRinhibitoror immunotherapy.Avastin was added with start of full dose FOLFOX3 weeks ago. -I started her on first-line5-FU for 2 cycles on 03/11/20 while her surgical wound healed. She started full doseFOLFOXwith avastin q2weeks on 1/4/21to control her disease.Due to neuropathy Oxaliplatin was stopped after 09/24/19. She is now on maintenancetherapy. -For convenience Iswitched her maintenance therapy tooral chemo Xeloda2048mBID 2 weeks on/1 week offand Zirabev q3weeks starting 01/21/20.Treatment held 05/01/20-05/12/20 due to COVID infection.  -She continues to tolerate Xeloda well without major side effects. Labs reviewed and adequate to proceed with Zirabev today. She started this cycle Xeloda on 5/3, I will refill #49 tabs for this cycle  -Plan to scan her before 08/29/20 OV -F/u in 3 weeks.   2. Chemotherapy induced peripheral neuropathy (CIPN), G2 -secondary to Oxaliplatin, developed after cycle 12 FOLFOX. Oxaliplatin d/c after C15 on 09/24/19 -Neuropathy in fingers and feet are moderate and stable with mild decreased function in fingers. -She is currently ongabapentin100 mg AM and 300 mg qHSand titrate up if needed. -I encouraged her to watch her balance and her driving.  3.Nutrition/Anorexia  -she reportedly weighed 253 lbs a few years ago, and weighed 203 lbs at symptom onset~11/2018 -Continue to f/u withdietician -She plans to get complete dentures soon. -She continues to gain weight back.  4. 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  5. HTN, uncontrolled -on hydralazine, isosorbide, lisinopril, metoprolol,amlodipineand HCTZ -Will monitor on bevacizumab.  6.Goals of  care discussion,Social support -The patient understands the goal of care is palliative. -she is full code now -She worked in [employer] 2 different jobs, but has been out of work lately due to her symptoms  -She is not getting disability, no pay, but her employer kept her on their insurance plan -She may be eligible for Medicaid  7. COVID (+) 05/01/20  -She tested positive for COVID on 05/01/20. She received treatment infusion on 05/06/20. Symptoms overall resolved after treatment.  PLAN: -Labs reviewed and adequate to proceed with ZNoah Charontoday   -Continue current cycleXeloda 20092mBID 2 weeks on/1 week(started 08/05/2020) -Lab, flush, F/u and Zirabev in 3  weeks.  -CT CAP w contrast before next cycle chemo  No problem-specific Assessment & Plan notes found for this encounter.   No orders of the defined types were placed in this encounter.  All questions were answered. The patient knows to call the clinic with any problems, questions or concerns. No barriers to learning was detected. The total time spent in the appointment was 30 minutes.     Truitt Merle, MD 08/08/2020   I, Joslyn Devon, am acting as scribe for Truitt Merle, MD.   I have reviewed the above documentation for accuracy and completeness, and I agree with the above.

## 2020-08-08 ENCOUNTER — Encounter: Payer: Self-pay | Admitting: Hematology

## 2020-08-08 ENCOUNTER — Other Ambulatory Visit (HOSPITAL_COMMUNITY): Payer: Self-pay

## 2020-08-08 ENCOUNTER — Inpatient Hospital Stay: Payer: Medicaid Other

## 2020-08-08 ENCOUNTER — Telehealth: Payer: Self-pay

## 2020-08-08 ENCOUNTER — Other Ambulatory Visit: Payer: Self-pay

## 2020-08-08 ENCOUNTER — Inpatient Hospital Stay: Payer: Medicaid Other | Attending: Hematology | Admitting: Hematology

## 2020-08-08 ENCOUNTER — Telehealth: Payer: Self-pay | Admitting: Hematology

## 2020-08-08 ENCOUNTER — Other Ambulatory Visit: Payer: Medicaid Other

## 2020-08-08 VITALS — BP 146/61 | HR 51 | Temp 96.6°F | Resp 18 | Ht 67.0 in | Wt 233.4 lb

## 2020-08-08 DIAGNOSIS — C787 Secondary malignant neoplasm of liver and intrahepatic bile duct: Secondary | ICD-10-CM | POA: Insufficient documentation

## 2020-08-08 DIAGNOSIS — C184 Malignant neoplasm of transverse colon: Secondary | ICD-10-CM | POA: Diagnosis present

## 2020-08-08 DIAGNOSIS — Z5112 Encounter for antineoplastic immunotherapy: Secondary | ICD-10-CM | POA: Diagnosis present

## 2020-08-08 DIAGNOSIS — C189 Malignant neoplasm of colon, unspecified: Secondary | ICD-10-CM

## 2020-08-08 DIAGNOSIS — C773 Secondary and unspecified malignant neoplasm of axilla and upper limb lymph nodes: Secondary | ICD-10-CM | POA: Insufficient documentation

## 2020-08-08 DIAGNOSIS — I10 Essential (primary) hypertension: Secondary | ICD-10-CM | POA: Diagnosis not present

## 2020-08-08 DIAGNOSIS — Z95828 Presence of other vascular implants and grafts: Secondary | ICD-10-CM

## 2020-08-08 LAB — CBC WITH DIFFERENTIAL (CANCER CENTER ONLY)
Abs Immature Granulocytes: 0.02 10*3/uL (ref 0.00–0.07)
Basophils Absolute: 0 10*3/uL (ref 0.0–0.1)
Basophils Relative: 1 %
Eosinophils Absolute: 0.3 10*3/uL (ref 0.0–0.5)
Eosinophils Relative: 4 %
HCT: 30.9 % — ABNORMAL LOW (ref 36.0–46.0)
Hemoglobin: 10.2 g/dL — ABNORMAL LOW (ref 12.0–15.0)
Immature Granulocytes: 0 %
Lymphocytes Relative: 34 %
Lymphs Abs: 2 10*3/uL (ref 0.7–4.0)
MCH: 34.6 pg — ABNORMAL HIGH (ref 26.0–34.0)
MCHC: 33 g/dL (ref 30.0–36.0)
MCV: 104.7 fL — ABNORMAL HIGH (ref 80.0–100.0)
Monocytes Absolute: 0.6 10*3/uL (ref 0.1–1.0)
Monocytes Relative: 11 %
Neutro Abs: 2.9 10*3/uL (ref 1.7–7.7)
Neutrophils Relative %: 50 %
Platelet Count: 157 10*3/uL (ref 150–400)
RBC: 2.95 MIL/uL — ABNORMAL LOW (ref 3.87–5.11)
RDW: 17.3 % — ABNORMAL HIGH (ref 11.5–15.5)
WBC Count: 5.8 10*3/uL (ref 4.0–10.5)
nRBC: 0 % (ref 0.0–0.2)

## 2020-08-08 LAB — CMP (CANCER CENTER ONLY)
ALT: 10 U/L (ref 0–44)
AST: 19 U/L (ref 15–41)
Albumin: 3.4 g/dL — ABNORMAL LOW (ref 3.5–5.0)
Alkaline Phosphatase: 84 U/L (ref 38–126)
Anion gap: 7 (ref 5–15)
BUN: 33 mg/dL — ABNORMAL HIGH (ref 6–20)
CO2: 19 mmol/L — ABNORMAL LOW (ref 22–32)
Calcium: 9 mg/dL (ref 8.9–10.3)
Chloride: 111 mmol/L (ref 98–111)
Creatinine: 1.11 mg/dL — ABNORMAL HIGH (ref 0.44–1.00)
GFR, Estimated: 57 mL/min — ABNORMAL LOW (ref >60)
Glucose, Bld: 86 mg/dL (ref 70–99)
Potassium: 4 mmol/L (ref 3.5–5.1)
Sodium: 137 mmol/L (ref 135–145)
Total Bilirubin: 0.5 mg/dL (ref 0.3–1.2)
Total Protein: 7.6 g/dL (ref 6.5–8.1)

## 2020-08-08 LAB — TOTAL PROTEIN, URINE DIPSTICK: Protein, ur: NEGATIVE mg/dL

## 2020-08-08 LAB — CEA (IN HOUSE-CHCC): CEA (CHCC-In House): 457.78 ng/mL — ABNORMAL HIGH (ref 0.00–5.00)

## 2020-08-08 MED ORDER — SODIUM CHLORIDE 0.9% FLUSH
10.0000 mL | INTRAVENOUS | Status: DC | PRN
  Administered 2020-08-08: 10 mL
  Filled 2020-08-08: qty 10

## 2020-08-08 MED ORDER — CAPECITABINE 500 MG PO TABS
ORAL_TABLET | ORAL | 0 refills | Status: DC
  Filled 2020-08-08: qty 49, fill #0
  Filled 2020-08-14: qty 49, 21d supply, fill #0

## 2020-08-08 MED ORDER — SODIUM CHLORIDE 0.9 % IV SOLN
Freq: Once | INTRAVENOUS | Status: AC
  Filled 2020-08-08: qty 250

## 2020-08-08 MED ORDER — HEPARIN SOD (PORK) LOCK FLUSH 100 UNIT/ML IV SOLN
500.0000 [IU] | Freq: Once | INTRAVENOUS | Status: AC | PRN
  Administered 2020-08-08: 500 [IU]
  Filled 2020-08-08: qty 5

## 2020-08-08 MED ORDER — SODIUM CHLORIDE 0.9 % IV SOLN
7.5000 mg/kg | Freq: Once | INTRAVENOUS | Status: AC
  Administered 2020-08-08: 800 mg via INTRAVENOUS
  Filled 2020-08-08: qty 32

## 2020-08-08 NOTE — Patient Instructions (Signed)
Implanted Port Insertion, Care After This sheet gives you information about how to care for yourself after your procedure. Your health care provider may also give you more specific instructions. If you have problems or questions, contact your health care provider. What can I expect after the procedure? After the procedure, it is common to have:  Discomfort at the port insertion site.  Bruising on the skin over the port. This should improve over 3-4 days. Follow these instructions at home: Port care  After your port is placed, you will get a manufacturer's information card. The card has information about your port. Keep this card with you at all times.  Take care of the port as told by your health care provider. Ask your health care provider if you or a family member can get training for taking care of the port at home. A home health care nurse may also take care of the port.  Make sure to remember what type of port you have. Incision care  Follow instructions from your health care provider about how to take care of your port insertion site. Make sure you: ? Wash your hands with soap and water before and after you change your bandage (dressing). If soap and water are not available, use hand sanitizer. ? Change your dressing as told by your health care provider. ? Leave stitches (sutures), skin glue, or adhesive strips in place. These skin closures may need to stay in place for 2 weeks or longer. If adhesive strip edges start to loosen and curl up, you may trim the loose edges. Do not remove adhesive strips completely unless your health care provider tells you to do that.  Check your port insertion site every day for signs of infection. Check for: ? Redness, swelling, or pain. ? Fluid or blood. ? Warmth. ? Pus or a bad smell.      Activity  Return to your normal activities as told by your health care provider. Ask your health care provider what activities are safe for you.  Do not  lift anything that is heavier than 10 lb (4.5 kg), or the limit that you are told, until your health care provider says that it is safe. General instructions  Take over-the-counter and prescription medicines only as told by your health care provider.  Do not take baths, swim, or use a hot tub until your health care provider approves. Ask your health care provider if you may take showers. You may only be allowed to take sponge baths.  Do not drive for 24 hours if you were given a sedative during your procedure.  Wear a medical alert bracelet in case of an emergency. This will tell any health care providers that you have a port.  Keep all follow-up visits as told by your health care provider. This is important. Contact a health care provider if:  You cannot flush your port with saline as directed, or you cannot draw blood from the port.  You have a fever or chills.  You have redness, swelling, or pain around your port insertion site.  You have fluid or blood coming from your port insertion site.  Your port insertion site feels warm to the touch.  You have pus or a bad smell coming from the port insertion site. Get help right away if:  You have chest pain or shortness of breath.  You have bleeding from your port that you cannot control. Summary  Take care of the port as told by your   health care provider. Keep the manufacturer's information card with you at all times.  Change your dressing as told by your health care provider.  Contact a health care provider if you have a fever or chills or if you have redness, swelling, or pain around your port insertion site.  Keep all follow-up visits as told by your health care provider. This information is not intended to replace advice given to you by your health care provider. Make sure you discuss any questions you have with your health care provider. Document Revised: 10/18/2017 Document Reviewed: 10/18/2017 Elsevier Patient Education   2021 Elsevier Inc.  

## 2020-08-08 NOTE — Telephone Encounter (Signed)
Casey Black called stating she has 23 Xeloda pills left.  She says enough for 7 days

## 2020-08-08 NOTE — Patient Instructions (Signed)
[facility]  Discharge Instructions: °Thank you for choosing [facility] to provide your oncology and hematology care.  ° °If you have a lab appointment with the Cancer Center, please go directly to the Cancer Center and check in at the registration area. °  °Wear comfortable clothing and clothing appropriate for easy access to any Portacath or PICC line.  ° °We strive to give you quality time with your provider. You may need to reschedule your appointment if you arrive late (15 or more minutes).  Arriving late affects you and other patients whose appointments are after yours.  Also, if you miss three or more appointments without notifying the office, you may be dismissed from the clinic at the provider’s discretion.    °  °For prescription refill requests, have your pharmacy contact our office and allow 72 hours for refills to be completed.   ° °Today you received the following chemotherapy and/or immunotherapy agents: Zirabev    °  °To help prevent nausea and vomiting after your treatment, we encourage you to take your nausea medication as directed. ° °BELOW ARE SYMPTOMS THAT SHOULD BE REPORTED IMMEDIATELY: °*FEVER GREATER THAN 100.4 F (38 °C) OR HIGHER °*CHILLS OR SWEATING °*NAUSEA AND VOMITING THAT IS NOT CONTROLLED WITH YOUR NAUSEA MEDICATION °*UNUSUAL SHORTNESS OF BREATH °*UNUSUAL BRUISING OR BLEEDING °*URINARY PROBLEMS (pain or burning when urinating, or frequent urination) °*BOWEL PROBLEMS (unusual diarrhea, constipation, pain near the anus) °TENDERNESS IN MOUTH AND THROAT WITH OR WITHOUT PRESENCE OF ULCERS (sore throat, sores in mouth, or a toothache) °UNUSUAL RASH, SWELLING OR PAIN  °UNUSUAL VAGINAL DISCHARGE OR ITCHING  ° °Items with * indicate a potential emergency and should be followed up as soon as possible or go to the Emergency Department if any problems should occur. ° °Please show the CHEMOTHERAPY ALERT CARD or IMMUNOTHERAPY ALERT CARD at check-in to  the Emergency Department and triage nurse. ° °Should you have questions after your visit or need to cancel or reschedule your appointment, please contact [facility]  Dept: [phone]  and follow the prompts.  Office hours are 8:00 a.m. to 4:30 p.m. Monday - Friday. Please note that voicemails left after 4:00 p.m. may not be returned until the following business day.  We are closed weekends and major holidays. You have access to a nurse at all times for urgent questions. Please call the main number to the clinic Dept: [phone] and follow the prompts. ° ° °For any non-urgent questions, you may also contact your provider using MyChart. We now offer e-Visits for anyone 18 and older to request care online for non-urgent symptoms. For details visit [email or web address]. °  °Also download the MyChart app! Go to the app store, search "MyChart", open the app, select [facility], and log in with your MyChart username and password. ° °Due to Covid, a mask is required upon entering the hospital/clinic. If you do not have a mask, one will be given to you upon arrival. For doctor visits, patients may have 1 support person aged 18 or older with them. For treatment visits, patients cannot have anyone with them due to current Covid guidelines and our immunocompromised population.  ° °

## 2020-08-08 NOTE — Telephone Encounter (Signed)
Scheduled per los. Confirmed appt. Declined printout  

## 2020-08-13 ENCOUNTER — Other Ambulatory Visit (HOSPITAL_COMMUNITY): Payer: Self-pay

## 2020-08-14 ENCOUNTER — Other Ambulatory Visit (HOSPITAL_COMMUNITY): Payer: Self-pay

## 2020-08-15 ENCOUNTER — Other Ambulatory Visit (HOSPITAL_COMMUNITY): Payer: Self-pay

## 2020-08-18 ENCOUNTER — Other Ambulatory Visit: Payer: Self-pay | Admitting: Internal Medicine

## 2020-08-19 ENCOUNTER — Telehealth: Payer: Self-pay

## 2020-08-19 NOTE — Telephone Encounter (Signed)
I spoke with Casey Black and gave her the date time and instructions for her upcoming Ct scan.  She verbalized understanding

## 2020-08-22 NOTE — Progress Notes (Signed)
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 Bowel-gas pattern consistent with small bowel obstruction. No free air. 2. No acute chest findings.   01/23/2019 Imaging   CT AP IMPRESSION: Obstructing mid transverse colonic mass with mild regional adenopathy and hepatic metastatic disease. The mass likely extends through the serosa; no ascites or peritoneal nodularity.   01/24/2019 Surgery   Surgeon: Clovis Riley MD Assistant: [name] Procedure performed: Transverse colectomy with end colostomy, liver biopsy Procedure classification: URGENT/EMERGENT Preop diagnosis: Obstructing, metastatic transverse colon mass Post-op diagnosis/intraop findings: Same   01/24/2019 Pathology Results   FINAL MICROSCOPIC DIAGNOSIS:   A. COLON, TRANSVERSE, RESECTION:  Colonic adenocarcinoma, 5 cm.  Carcinoma extends into pericolonic connective tissue and focally to  serosal surface.  Margins not involved.  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   B. LIVER NODULE, LEFT, BIOPSY:  Metastatic adenocarcinoma.    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 Hypermetabolic metastatic disease in the liver and mediastinal/hilar/axillary lymph nodes. 2. Focal hypermetabolism in the rectum. Continued attention on follow-up exams is warranted. 3. Focal hypermetabolism medial to the right adrenal gland may be within a metastatic lymph node, better visualized on 01/23/2019. 4. Aortic atherosclerosis (ICD10-170.0). Coronary artery calcification.   03/12/2019 -  Chemotherapy   She started 5FU q2weeks on 03/12/19 for 2 cycles. She started full dose FOLFOX with Avastin on 04/09/19. Oxaliplatin dose reduced repeatedly due to neuropathy C12 and held since C16 on 10/09/19. Now on maintenance Avastin and 5FU q2weeks since 10/09/19       -Maintenance change to maintenance xeloda 2000 mg BID days 1-14 q21 days and q3 weeks Zirabev (15 mg/kg) starting 01/21/20. First cycle was taken 1022m BID due to misunderstanding.    05/31/2019 Imaging   Restaging CT CAP IMPRESSION: 1. Similar to mild interval decrease in size of multiple hepatic lesions, partially calcified. 2. 2 mm right upper lobe pulmonary nodule. Recommend attention on follow-up. 3. Emphysema and aortic atherosclerosis.   08/23/2019 Imaging   CT CAP w contrast  IMPRESSION: 1. The dominant peripheral right liver metastasis has mildly increased. Other smaller liver metastases are stable. 2. Otherwise no new or progressive metastatic disease in the chest, abdomen or pelvis. 3. Aortic Atherosclerosis (ICD10-I70.0) and Emphysema (ICD10-J43.9).   11/27/2019 Imaging   CT CAP w contrast  IMPRESSION: Status post transverse colectomy with right mid abdominal colostomy.   Mildly progressive hepatic metastases, as above.   No evidence of metastatic  disease in the chest.  Small mediastinal lymph nodes are within normal limits.   Additional stable ancillary findings as above.   02/21/2020 Imaging   IMPRESSION: 1. Stable hepatic metastatic disease. 2. Aortic atherosclerosis (ICD10-I70.0). Coronary artery calcification. 3.  Emphysema (ICD10-J43.9).   05/19/2020 Imaging   CT CAP  IMPRESSION: 1. Multiple partially calcified liver metastases are again noted. With the exception of a small lesion in segment 7/8 lesions are not significantly changed in the interval. No new liver lesions identified. 2. Coronary artery atherosclerotic calcifications. 3. Aortic atherosclerosis. 4. 2 mm right upper lobe lung nodule identified.  Unchanged.   Aortic Atherosclerosis (ICD10-I70.0).      CURRENT THERAPY:  Maintenance Avastin and 5FU q2weeks since 10/09/19, change to maintenanceXeloda2000 mg BID days 1-14 q21 days and q3 weeks avastin (15 mg/kg) starting 01/21/20. First cycle was taken 1000mg BID due to misunderstanding.Full treatment held after 04/15/20 due to COVID. Restarted Xeloda on 05/13/20.  INTERVAL HISTORY:  Casey Black is here for a follow up. She was last seen by me 08/08/20. She presents to the clinic alone. She notes she is doing well. She notes she tolerated her last cycle well. She notes no left shoulder pain. She notes she started her current cycle on 08/26/20. She still has mild skin toxicity. I reviewed her medication list with her today.    REVIEW OF SYSTEMS:   Constitutional: Denies fevers, chills or abnormal weight loss Eyes: Denies blurriness of vision Ears, nose, mouth, throat, and face: Denies mucositis or sore throat Respiratory: Denies cough, dyspnea or wheezes Cardiovascular: Denies palpitation, chest discomfort or lower extremity swelling Gastrointestinal:  Denies nausea, heartburn or change in bowel habits Skin: Denies abnormal skin rashes (+) Skin darkening and dryness  Lymphatics: Denies new lymphadenopathy or easy  bruising Neurological: (+) Stable neuropathy in hands and feet  Behavioral/Psych: Mood is stable, no new changes  All other systems were reviewed with the patient and are negative.  MEDICAL HISTORY:  Past Medical History:  Diagnosis Date  . Anemia    low iron  . Cancer (HCC)    colon cancer  . Colon cancer (HCC) 01/2019  . Hypertension 01/23/2019  . Personal history of chemotherapy 01/2019   colon CA  . SBO (small bowel obstruction) (HCC) 01/23/2019    SURGICAL HISTORY: Past Surgical History:  Procedure Laterality Date  . CESAREAN SECTION     x2  . COLOSTOMY N/A 01/24/2019   Procedure: End Loop Colostomy;  Surgeon: Connor, Chelsea A, MD;  Location: [facility] OR;  Service: General;  Laterality: N/A;  . PARTIAL COLECTOMY N/A 01/24/2019   Procedure: PARTIAL COLECTOMY;  Surgeon: Connor, Chelsea A, MD;  Location: [facility] OR;  Service: General;  Laterality: N/A;  . PORTACATH PLACEMENT Right 02/28/2019   Procedure: INSERTION PORT-A-CATH WITH ULTRASOUND GUIDANCE;  Surgeon: Connor, Chelsea A, MD;  Location: [facility] OR;  Service: General;  Laterality: Right;    I have reviewed the social history and family history with the patient and they are unchanged from previous note.  ALLERGIES:  has No Known Allergies.  MEDICATIONS:  Current Outpatient Medications  Medication Sig Dispense Refill  . acetaminophen (TYLENOL) 325 MG tablet Take 2 tablets (650 mg total) by mouth every 6 (six) hours as needed.    . amLODipine (NORVASC) 10 MG tablet TAKE 1 TABLET BY MOUTH EVERY DAY 90 tablet 1  . benzonatate (TESSALON) 100 MG capsule Take 1 capsule (100 mg total) by mouth every 8 (eight) hours. 21 capsule 0  .   capecitabine (XELODA) 500 MG tablet Take 4 tablets (2,000 mg total) by mouth 2 (two) times daily after a meal. For 14 days, then off for 7 days. Pt has 32 pills at home, please refill #80 to complete this cycle 80 tablet 0  . ferrous sulfate 325 (65 FE) MG tablet TAKE 1 TABLET (325 MG TOTAL) BY MOUTH 2 (TWO)  TIMES DAILY WITH A MEAL. 180 tablet 1  . gabapentin (NEURONTIN) 300 MG capsule Take 1 capsule (300 mg total) by mouth 2 (two) times daily. 60 capsule 3  . hydrALAZINE (APRESOLINE) 25 MG tablet TAKE 1 TABLET BY MOUTH EVERY 8 HOURS. 90 tablet 1  . hydrochlorothiazide (HYDRODIURIL) 12.5 MG tablet Take 1 tablet (12.5 mg total) by mouth daily. 90 tablet 1  . hydrocortisone (ANUSOL-HC) 2.5 % rectal cream Place 1 application rectally 2 (two) times daily. 30 g 0  . Ibuprofen 200 MG CAPS Take 400 mg by mouth daily as needed (pain).    . isosorbide mononitrate (IMDUR) 30 MG 24 hr tablet Take 1 tablet (30 mg total) by mouth daily. 90 tablet 0  . lidocaine (LMX) 4 % cream Apply 1 application topically 3 (three) times daily as needed. 30 g 0  . lisinopril (ZESTRIL) 40 MG tablet TAKE 1 TABLET BY MOUTH EVERY DAY 90 tablet 1  . loperamide (IMODIUM) 2 MG capsule TAKE 1 CAPSULE (2 MG TOTAL) BY MOUTH AS NEEDED FOR DIARRHEA OR LOOSE STOOLS. 90 capsule 0  . metoprolol tartrate (LOPRESSOR) 50 MG tablet TAKE 1 TABLET BY MOUTH TWICE A DAY 180 tablet 1  . ondansetron (ZOFRAN ODT) 4 MG disintegrating tablet 4mg ODT q4 hours prn nausea/vomit 20 tablet 0  . ondansetron (ZOFRAN) 4 MG tablet Take 1 tablet (4 mg total) by mouth every 6 (six) hours as needed for nausea. 30 tablet 0  . polycarbophil (FIBERCON) 625 MG tablet Take 1 tablet (625 mg total) by mouth daily. 30 tablet 0   No current facility-administered medications for this visit.   Facility-Administered Medications Ordered in Other Visits  Medication Dose Route Frequency Provider Last Rate Last Admin  . sodium chloride flush (NS) 0.9 % injection 10 mL  10 mL Intracatheter PRN Feng, Yan, MD   10 mL at 06/06/20 1051  . sodium chloride flush (NS) 0.9 % injection 10 mL  10 mL Intracatheter PRN Feng, Yan, MD   10 mL at 08/27/20 1047    PHYSICAL EXAMINATION: ECOG PERFORMANCE STATUS: 1 - Symptomatic but completely ambulatory  Vitals:   08/27/20 1004  BP: 136/65   Pulse: (!) 48  Resp: 19  Temp: 97.9 F (36.6 C)  SpO2: 100%   Filed Weights   08/27/20 1004  Weight: 228 lb 9.6 oz (103.7 kg)    Due to COVID19 we will limit examination to appearance. Patient had no complaints.  GENERAL:alert, no distress and comfortable SKIN: skin color, texture, turgor are normal, no rashes or significant lesions (+) Skin darkening and dryness EYES: normal, Conjunctiva are pink and non-injected, sclera clear  NEURO: alert & oriented x 3 with fluent speech   LABORATORY DATA:  I have reviewed the data as listed CBC Latest Ref Rng & Units 08/27/2020 08/08/2020 07/18/2020  WBC 4.0 - 10.5 K/uL 6.2 5.8 5.3  Hemoglobin 12.0 - 15.0 g/dL 11.0(L) 10.2(L) 10.5(L)  Hematocrit 36.0 - 46.0 % 34.0(L) 30.9(L) 33.1(L)  Platelets 150 - 400 K/uL 158 157 151     CMP Latest Ref Rng & Units 08/27/2020 08/08/2020 07/18/2020  Glucose 70 -   99 mg/dL 67(L) 86 117(H)  BUN 6 - 20 mg/dL 28(H) 33(H) 27(H)  Creatinine 0.44 - 1.00 mg/dL 1.10(H) 1.11(H) 0.89  Sodium 135 - 145 mmol/L 138 137 139  Potassium 3.5 - 5.1 mmol/L 4.4 4.0 4.4  Chloride 98 - 111 mmol/L 110 111 112(H)  CO2 22 - 32 mmol/L 20(L) 19(L) 19(L)  Calcium 8.9 - 10.3 mg/dL 9.5 9.0 9.3  Total Protein 6.5 - 8.1 g/dL 7.6 7.6 7.6  Total Bilirubin 0.3 - 1.2 mg/dL 0.6 0.5 0.5  Alkaline Phos 38 - 126 U/L 89 84 92  AST 15 - 41 U/L _0 ALT 0 - 44 U/L _1 RADIOGRAPHIC STUDIES: I have personally reviewed the radiological images as listed and agreed with the findings in the report. No results found.   ASSESSMENT & PLAN:  Aariana Shankland is a 60 y.o. female with   1. Adenocarcinoma of transverse colon, moderately differentiated, pT4aN1aM1a stage IV with liverand nodalmetastasis,MSS, KRAS G12S(+) -Shewas diagnosed in 01/2019 afteremergent colectomy and liver biopsy. Pathology showed stage IV colonic adenocarcinoma metastatic to liver.11/23/20PET showsknown liver metastasis and metastatic lymphadenopathy in  chest and right axilla.  -Wepreviouslydiscussed her cancer is stage IV and no longer eligible for surgery. The likelihood of curing this is very lowdue to her diffuse metastasis, but still very treatable. -[name] shows MSI stable disease, Kras+. There are no targetable mutation, she is not a candidate for EGFRinhibitoror immunotherapy.Avastin was added with start of full dose FOLFOX3 weeks ago. -I started her on first-line5-FU for 2 cycles on 03/11/20 while her surgical wound healed. She started full doseFOLFOXwith avastin q2weeks on 1/4/21to control her disease.Due to neuropathy Oxaliplatin was stopped after 09/24/19. She is now on maintenancetherapy. -For convenience Iswitched her maintenance therapy tooral chemo Xeloda2035mBID 2 weeks on/1 week offand Zirabev q3weeks starting 01/21/20.Treatment held 05/01/20-05/12/20 due to COVID infection. -Her CT CAP from 08/25/20 shows stable to slight decrease in size of liver metastases, stable 3 mm right upper lobe lung nodule and Low right paratracheal lymph node is mildly increased in size. No new or progressive disease. I personally reviewed scan images and discussed with patient today.  -I discussed if her tumor marker continues to increase, will repeat scan in 2 months.  -She continues to tolerate Xeloda well with mild skin toxicity and stable neuropathy. Labs reviewed, Hg 11, negative urine protein. Overall adequate to proceed with Zirabev today and continue Xeloda. She started current cycle on 08/26/20.  -F/u in 3 weeks.   2. Chemotherapy induced peripheral neuropathy (CIPN), G2 -secondary to Oxaliplatin, developed after cycle 12 FOLFOX. Oxaliplatin d/c after C15 on 09/24/19 -Neuropathy in fingers and feet are moderate and stable with mild decreased function in fingers. -She is currently ongabapentin100 mg AM and 300 mg qHSand titrate up if needed. -Stable and manageable on Xeloda. Will continue to monitor.    3.Nutrition/Anorexia  -she reportedly weighed 253 lbs a few years ago, and weighed 203 lbs at symptom onset~11/2018 -Continue to f/u withdietician -She plans to get complete dentures soon. -She continues to gain weight back.  4. 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  5. HTN, uncontrolled -on hydralazine, isosorbide, lisinopril, metoprolol,amlodipineand HCTZ -Will monitor on bevacizumab.  6.Goals of care discussion,Social support -The patient understands the goal of care is palliative. 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 COVID (+) 05/01/20  -She tested positive for COVID on 05/01/20. She received treatment infusion on 05/06/20. Symptoms overall resolved after treatment.  PLAN: -Labs reviewed and adequate to proceed with Zirabev today -Continue current cycleXeloda 2000mg BID 2 weeks on/1 week, current cycle started on 08/26/20. I refilled today for this and next cycle.  -Lab, flush, F/u and Zirabev in 3 and 6 weeks.    No problem-specific Assessment & Plan notes found for this encounter.   No orders of the defined types were placed in this encounter.  All questions were answered. The patient knows to call the clinic with any problems, questions or concerns. No barriers to learning was detected. The total time spent in the appointment was 30 minutes.     Yan Feng, MD 08/27/2020   I, Amoya Bennett, am acting as scribe for Yan Feng, MD.   I have reviewed the above documentation for accuracy and completeness, and I agree with the above.       

## 2020-08-25 ENCOUNTER — Ambulatory Visit (HOSPITAL_COMMUNITY)
Admission: RE | Admit: 2020-08-25 | Discharge: 2020-08-25 | Disposition: A | Discharge status: Home / Self Care | Payer: Medicaid Other | Source: Ambulatory Visit | Attending: Hematology | Admitting: Hematology

## 2020-08-25 ENCOUNTER — Other Ambulatory Visit: Payer: Self-pay

## 2020-08-25 DIAGNOSIS — C189 Malignant neoplasm of colon, unspecified: Secondary | ICD-10-CM | POA: Diagnosis not present

## 2020-08-25 DIAGNOSIS — I700 Atherosclerosis of aorta: Secondary | ICD-10-CM | POA: Diagnosis not present

## 2020-08-25 DIAGNOSIS — C787 Secondary malignant neoplasm of liver and intrahepatic bile duct: Secondary | ICD-10-CM | POA: Insufficient documentation

## 2020-08-25 DIAGNOSIS — R911 Solitary pulmonary nodule: Secondary | ICD-10-CM | POA: Diagnosis not present

## 2020-08-25 DIAGNOSIS — I2510 Atherosclerotic heart disease of native coronary artery without angina pectoris: Secondary | ICD-10-CM | POA: Diagnosis not present

## 2020-08-26 DIAGNOSIS — T8131XD Disruption of external operation (surgical) wound, not elsewhere classified, subsequent encounter: Secondary | ICD-10-CM | POA: Diagnosis not present

## 2020-08-26 DIAGNOSIS — K56609 Unspecified intestinal obstruction, unspecified as to partial versus complete obstruction: Secondary | ICD-10-CM | POA: Diagnosis not present

## 2020-08-26 DIAGNOSIS — S31109A Unspecified open wound of abdominal wall, unspecified quadrant without penetration into peritoneal cavity, initial encounter: Secondary | ICD-10-CM | POA: Diagnosis not present

## 2020-08-26 DIAGNOSIS — Z933 Colostomy status: Secondary | ICD-10-CM | POA: Diagnosis not present

## 2020-08-27 ENCOUNTER — Encounter: Payer: Self-pay | Admitting: Hematology

## 2020-08-27 ENCOUNTER — Inpatient Hospital Stay: Payer: Medicaid Other

## 2020-08-27 ENCOUNTER — Other Ambulatory Visit (HOSPITAL_COMMUNITY): Payer: Self-pay

## 2020-08-27 ENCOUNTER — Other Ambulatory Visit: Payer: Self-pay

## 2020-08-27 ENCOUNTER — Inpatient Hospital Stay (HOSPITAL_BASED_OUTPATIENT_CLINIC_OR_DEPARTMENT_OTHER): Payer: Medicaid Other | Admitting: Hematology

## 2020-08-27 ENCOUNTER — Telehealth: Payer: Self-pay | Admitting: Hematology

## 2020-08-27 VITALS — BP 136/65 | HR 48 | Temp 97.9°F | Resp 19 | Ht 67.0 in | Wt 228.6 lb

## 2020-08-27 DIAGNOSIS — I10 Essential (primary) hypertension: Secondary | ICD-10-CM

## 2020-08-27 DIAGNOSIS — Z5112 Encounter for antineoplastic immunotherapy: Secondary | ICD-10-CM | POA: Diagnosis not present

## 2020-08-27 DIAGNOSIS — C189 Malignant neoplasm of colon, unspecified: Secondary | ICD-10-CM

## 2020-08-27 DIAGNOSIS — Z95828 Presence of other vascular implants and grafts: Secondary | ICD-10-CM

## 2020-08-27 DIAGNOSIS — C787 Secondary malignant neoplasm of liver and intrahepatic bile duct: Secondary | ICD-10-CM

## 2020-08-27 LAB — CBC WITH DIFFERENTIAL (CANCER CENTER ONLY)
Abs Immature Granulocytes: 0.01 10*3/uL (ref 0.00–0.07)
Basophils Absolute: 0 10*3/uL (ref 0.0–0.1)
Basophils Relative: 1 %
Eosinophils Absolute: 0.3 10*3/uL (ref 0.0–0.5)
Eosinophils Relative: 4 %
HCT: 34 % — ABNORMAL LOW (ref 36.0–46.0)
Hemoglobin: 11 g/dL — ABNORMAL LOW (ref 12.0–15.0)
Immature Granulocytes: 0 %
Lymphocytes Relative: 25 %
Lymphs Abs: 1.6 10*3/uL (ref 0.7–4.0)
MCH: 34.5 pg — ABNORMAL HIGH (ref 26.0–34.0)
MCHC: 32.4 g/dL (ref 30.0–36.0)
MCV: 106.6 fL — ABNORMAL HIGH (ref 80.0–100.0)
Monocytes Absolute: 0.8 10*3/uL (ref 0.1–1.0)
Monocytes Relative: 13 %
Neutro Abs: 3.5 10*3/uL (ref 1.7–7.7)
Neutrophils Relative %: 57 %
Platelet Count: 158 10*3/uL (ref 150–400)
RBC: 3.19 MIL/uL — ABNORMAL LOW (ref 3.87–5.11)
RDW: 18.1 % — ABNORMAL HIGH (ref 11.5–15.5)
WBC Count: 6.2 10*3/uL (ref 4.0–10.5)
nRBC: 0 % (ref 0.0–0.2)

## 2020-08-27 LAB — CMP (CANCER CENTER ONLY)
ALT: 10 U/L (ref 0–44)
AST: 18 U/L (ref 15–41)
Albumin: 3.4 g/dL — ABNORMAL LOW (ref 3.5–5.0)
Alkaline Phosphatase: 89 U/L (ref 38–126)
Anion gap: 8 (ref 5–15)
BUN: 28 mg/dL — ABNORMAL HIGH (ref 6–20)
CO2: 20 mmol/L — ABNORMAL LOW (ref 22–32)
Calcium: 9.5 mg/dL (ref 8.9–10.3)
Chloride: 110 mmol/L (ref 98–111)
Creatinine: 1.1 mg/dL — ABNORMAL HIGH (ref 0.44–1.00)
GFR, Estimated: 58 mL/min — ABNORMAL LOW (ref >60)
Glucose, Bld: 67 mg/dL — ABNORMAL LOW (ref 70–99)
Potassium: 4.4 mmol/L (ref 3.5–5.1)
Sodium: 138 mmol/L (ref 135–145)
Total Bilirubin: 0.6 mg/dL (ref 0.3–1.2)
Total Protein: 7.6 g/dL (ref 6.5–8.1)

## 2020-08-27 LAB — CEA (IN HOUSE-CHCC): CEA (CHCC-In House): 275.89 ng/mL — ABNORMAL HIGH (ref 0.00–5.00)

## 2020-08-27 LAB — TOTAL PROTEIN, URINE DIPSTICK: Protein, ur: NEGATIVE mg/dL

## 2020-08-27 MED ORDER — GABAPENTIN 300 MG PO CAPS: 300.0000 mg | ORAL_CAPSULE | Freq: Two times a day (BID) | ORAL | 3 refills | Status: DC

## 2020-08-27 MED ORDER — HEPARIN SOD (PORK) LOCK FLUSH 100 UNIT/ML IV SOLN
500.0000 [IU] | Freq: Once | INTRAVENOUS | Status: AC | PRN
  Administered 2020-08-27: 500 [IU]
  Filled 2020-08-27: qty 5

## 2020-08-27 MED ORDER — SODIUM CHLORIDE 0.9% FLUSH
10.0000 mL | INTRAVENOUS | Status: DC | PRN
Start: 2020-08-27 — End: 2020-08-27
  Filled 2020-08-27: qty 10

## 2020-08-27 MED ORDER — SODIUM CHLORIDE 0.9 % IV SOLN
Freq: Once | INTRAVENOUS | Status: AC
Start: 2020-08-27 — End: 2020-08-27
  Filled 2020-08-27: qty 250

## 2020-08-27 MED ORDER — CAPECITABINE 500 MG PO TABS
2000.0000 mg | ORAL_TABLET | Freq: Two times a day (BID) | ORAL | 0 refills | Status: DC
  Filled 2020-08-27 (×2): qty 80, 10d supply, fill #0

## 2020-08-27 MED ORDER — CAPECITABINE 500 MG PO TABS
2000.0000 mg | ORAL_TABLET | Freq: Two times a day (BID) | ORAL | 1 refills | Status: DC
  Filled 2020-08-27: qty 112, 14d supply, fill #0

## 2020-08-27 MED ORDER — SODIUM CHLORIDE 0.9% FLUSH
10.0000 mL | INTRAVENOUS | Status: DC | PRN
  Administered 2020-08-27: 10 mL
  Filled 2020-08-27: qty 10

## 2020-08-27 MED ORDER — SODIUM CHLORIDE 0.9 % IV SOLN
7.5000 mg/kg | Freq: Once | INTRAVENOUS | Status: AC
  Administered 2020-08-27: 800 mg via INTRAVENOUS
  Filled 2020-08-27: qty 32

## 2020-08-27 NOTE — Telephone Encounter (Signed)
Scheduled follow-up appointment per 5/25 los. Patient is aware.

## 2020-08-27 NOTE — Patient Instructions (Signed)
[facility] MEDICAL ONCOLOGY  Discharge Instructions: °Thank you for choosing [facility] to provide your oncology and hematology care.  ° °If you have a lab appointment with the Cancer Center, please go directly to the Cancer Center and check in at the registration area. °  °Wear comfortable clothing and clothing appropriate for easy access to any Portacath or PICC line.  ° °We strive to give you quality time with your provider. You may need to reschedule your appointment if you arrive late (15 or more minutes).  Arriving late affects you and other patients whose appointments are after yours.  Also, if you miss three or more appointments without notifying the office, you may be dismissed from the clinic at the provider’s discretion.    °  °For prescription refill requests, have your pharmacy contact our office and allow 72 hours for refills to be completed.   ° °Today you received the following chemotherapy and/or immunotherapy agents: bevacizumab    °  °To help prevent nausea and vomiting after your treatment, we encourage you to take your nausea medication as directed. ° °BELOW ARE SYMPTOMS THAT SHOULD BE REPORTED IMMEDIATELY: °*FEVER GREATER THAN 100.4 F (38 °C) OR HIGHER °*CHILLS OR SWEATING °*NAUSEA AND VOMITING THAT IS NOT CONTROLLED WITH YOUR NAUSEA MEDICATION °*UNUSUAL SHORTNESS OF BREATH °*UNUSUAL BRUISING OR BLEEDING °*URINARY PROBLEMS (pain or burning when urinating, or frequent urination) °*BOWEL PROBLEMS (unusual diarrhea, constipation, pain near the anus) °TENDERNESS IN MOUTH AND THROAT WITH OR WITHOUT PRESENCE OF ULCERS (sore throat, sores in mouth, or a toothache) °UNUSUAL RASH, SWELLING OR PAIN  °UNUSUAL VAGINAL DISCHARGE OR ITCHING  ° °Items with * indicate a potential emergency and should be followed up as soon as possible or go to the Emergency Department if any problems should occur. ° °Please show the CHEMOTHERAPY ALERT CARD or IMMUNOTHERAPY ALERT CARD at check-in  to the Emergency Department and triage nurse. ° °Should you have questions after your visit or need to cancel or reschedule your appointment, please contact [facility]ICAL [facility]COL[facility]  Dept: [phone]  and follow the prompts.  Office hours are 8:00 a.m. to 4:30 p.m. Monday - Friday. Please note that voicemails left after 4:00 p.m. may not be returned until the following business day.  We are closed weekends and major holidays. You have access to a nurse at all times for urgent questions. Please call the main number to the clinic Dept: [phone] and follow the prompts. ° ° °For any non-urgent questions, you may also contact your provider using MyChart. We now offer e-Visits for anyone 18 and older to request care online for non-urgent symptoms. For details visit [email or web address]. °  °Also download the MyChart app! Go to the app store, search "MyChart", open the app, select [facility], and log in with your MyChart username and password. ° °Due to Covid, a mask is required upon entering the hospital/clinic. If you do not have a mask, one will be given to you upon arrival. For doctor visits, patients may have 1 support person aged 18 or older with them. For treatment visits, patients cannot have anyone with them due to current Covid guidelines and our immunocompromised population.  ° °

## 2020-08-28 ENCOUNTER — Other Ambulatory Visit: Payer: Self-pay | Admitting: Pharmacist

## 2020-08-28 ENCOUNTER — Other Ambulatory Visit (HOSPITAL_COMMUNITY): Payer: Self-pay

## 2020-08-28 DIAGNOSIS — C189 Malignant neoplasm of colon, unspecified: Secondary | ICD-10-CM

## 2020-08-28 MED ORDER — CAPECITABINE 500 MG PO TABS
2000.0000 mg | ORAL_TABLET | Freq: Two times a day (BID) | ORAL | 0 refills | Status: DC
  Filled 2020-08-28: qty 80, 10d supply, fill #0
  Filled 2020-09-15: qty 112, 14d supply, fill #1

## 2020-08-28 NOTE — Progress Notes (Signed)
Oral Oncology Pharmacist Encounter  Prescription refill for Xeloda sent to [facility]. Prescription updated to total quantity of #192. Pharmacy will partial fill prescription for #80 tablets for patient to finish off current cycle since she still has #32 tablets at home. Remaining prescription quantity of 112 tablets will be utilized to complete fill for patient's subsequent cycle.   Leron Croak, PharmD, BCPS Hematology/Oncology Clinical Pharmacist [facility] [phone] 08/28/2020 1:13 PM

## 2020-08-29 ENCOUNTER — Other Ambulatory Visit (HOSPITAL_COMMUNITY): Payer: Self-pay

## 2020-08-29 ENCOUNTER — Ambulatory Visit: Payer: Medicaid Other | Admitting: Hematology

## 2020-08-29 ENCOUNTER — Other Ambulatory Visit: Payer: Medicaid Other

## 2020-08-29 ENCOUNTER — Ambulatory Visit: Payer: Medicaid Other

## 2020-09-02 ENCOUNTER — Other Ambulatory Visit (HOSPITAL_COMMUNITY): Payer: Self-pay

## 2020-09-10 ENCOUNTER — Other Ambulatory Visit (HOSPITAL_COMMUNITY): Payer: Self-pay

## 2020-09-12 ENCOUNTER — Other Ambulatory Visit (HOSPITAL_COMMUNITY): Payer: Self-pay

## 2020-09-15 ENCOUNTER — Other Ambulatory Visit (HOSPITAL_COMMUNITY): Payer: Self-pay

## 2020-09-15 NOTE — Progress Notes (Signed)
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 Bowel-gas pattern consistent with small bowel obstruction. No free air. 2. No acute chest findings.   01/23/2019 Imaging   CT AP IMPRESSION: Obstructing mid transverse colonic mass with mild regional adenopathy and hepatic metastatic disease. The mass likely extends through the serosa; no ascites or peritoneal nodularity.   01/24/2019 Surgery   Surgeon: Clovis Riley MD Assistant: [name] Procedure performed: Transverse colectomy with end colostomy, liver biopsy Procedure classification: URGENT/EMERGENT Preop diagnosis: Obstructing, metastatic transverse colon mass Post-op diagnosis/intraop findings: Same   01/24/2019 Pathology Results   FINAL MICROSCOPIC DIAGNOSIS:   A. COLON, TRANSVERSE, RESECTION:  Colonic adenocarcinoma, 5 cm.  Carcinoma extends into pericolonic connective tissue and focally to  serosal surface.  Margins not involved.  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   B. LIVER NODULE, LEFT, BIOPSY:  Metastatic adenocarcinoma.    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 Hypermetabolic metastatic disease in the liver and mediastinal/hilar/axillary lymph nodes. 2. Focal hypermetabolism in the rectum. Continued attention on follow-up exams is warranted. 3. Focal hypermetabolism medial to the right adrenal gland may be within a metastatic lymph node, better visualized on 01/23/2019. 4. Aortic atherosclerosis (ICD10-170.0). Coronary artery calcification.   03/12/2019 -  Chemotherapy   She started 5FU q2weeks on 03/12/19 for 2 cycles. She started full dose FOLFOX with Avastin on 04/09/19. Oxaliplatin dose reduced repeatedly due to neuropathy C12 and held since C16 on 10/09/19. Now on maintenance Avastin and 5FU q2weeks since 10/09/19       -Maintenance change to maintenance xeloda 2000 mg BID days 1-14 q21 days and q3 weeks Zirabev (15 mg/kg) starting 01/21/20. First cycle was taken 1033m BID due to misunderstanding.    05/31/2019 Imaging   Restaging CT CAP IMPRESSION: 1. Similar to mild interval decrease in size of multiple hepatic lesions, partially calcified. 2. 2 mm right upper lobe pulmonary nodule. Recommend attention on follow-up. 3. Emphysema and aortic atherosclerosis.   08/23/2019 Imaging   CT CAP w contrast  IMPRESSION: 1. The dominant peripheral right liver metastasis has mildly increased. Other smaller liver metastases are stable. 2. Otherwise no new or progressive metastatic disease in the chest, abdomen or pelvis. 3. Aortic Atherosclerosis (ICD10-I70.0) and Emphysema (ICD10-J43.9).   11/27/2019 Imaging   CT CAP w contrast  IMPRESSION: Status post transverse colectomy with right mid abdominal colostomy.   Mildly progressive hepatic metastases, as above.   No evidence  of metastatic disease in the chest.  Small mediastinal lymph nodes are within normal limits.   Additional stable ancillary findings as above.   02/21/2020 Imaging   IMPRESSION: 1. Stable hepatic metastatic disease. 2. Aortic atherosclerosis (ICD10-I70.0). Coronary artery calcification. 3.  Emphysema (ICD10-J43.9).   05/19/2020 Imaging   CT CAP  IMPRESSION: 1. Multiple partially calcified liver metastases are again noted. With the exception of a small lesion in segment 7/8 lesions are not significantly changed in the interval. No new liver lesions identified. 2. Coronary artery atherosclerotic calcifications. 3. Aortic atherosclerosis. 4. 2 mm right upper lobe lung nodule identified.  Unchanged.   Aortic Atherosclerosis (ICD10-I70.0).      CURRENT THERAPY:  Maintenance Avastin and 5FU q2weeks since 10/09/19, change to maintenance Xeloda 2000 mg BID days 1-14 q21 days and q3 weeks avastin (15 mg/kg) starting 01/21/20. First cycle was taken 1051m BID due to misunderstanding. Full treatment held after 04/15/20 due to [place] Restarted Xeloda on 05/13/20.   INTERVAL HISTORY:  MEffie Wahlertis here for a follow up. She was last seen by me 08/27/20. She presents to the clinic alone. She notes she is doing well. She denies any new major changes. She has further skin darkening of palms and her neuropathy is mild and management. She still has adequate function of hands. She denies pain, nausea or abdominal issues.  She notes she plans to travel to beach on 7/21-7/24.    REVIEW OF SYSTEMS:   Constitutional: Denies fevers, chills or abnormal weight loss Eyes: Denies blurriness of vision Ears, nose, mouth, throat, and face: Denies mucositis or sore throat Respiratory: Denies cough, dyspnea or wheezes Cardiovascular: Denies palpitation, chest discomfort or lower extremity swelling Gastrointestinal:  Denies nausea, heartburn or change in bowel habits Skin: Denies abnormal skin rashes Lymphatics: Denies new lymphadenopathy or  easy bruising Neurological: (+) Mild neuropathy in hands  Behavioral/Psych: Mood is stable, no new changes  All other systems were reviewed with the patient and are negative.  MEDICAL HISTORY:  Past Medical History:  Diagnosis Date   Anemia    low iron   Cancer (HCC)    colon cancer   Col[place] cancer [place] 01[place]   Hypertension 01/23/2019   Personal history of chemotherapy 01/2019   colon CA   S[place] obstruction) [place] 01/23/2019    SURGICAL HISTORY: Past Surgical History:  Procedure Laterality Date   CESAREAN SECTION     x2   COLOSTOMY N/A 01/24/2019   Procedure: End Loop Colostomy;  Surgeon: CClovis Riley MD;  [facility];  Service: General;  Laterality: N/A;   PARTIAL COLECTOMY N/A 01/24/2019   Procedure: PARTIAL COLECTOMY;  Surgeon: CClovis Riley MD;  [facility];  Service: General;  Laterality: N/A;   PORTACATH PLACEMENT Right 02/28/2019   Procedure: INSERTION PORT-A-CATH WITH ULTRASOUND GUIDANCE;  Surgeon: CClovis Riley MD;  [place]  Service: General;  Laterality: Right;    I have reviewed the social history and family history with the patient and they are unchanged from previous note.  ALLERGIES:  has No Known Allergies.  MEDICATIONS:  Current Outpatient Medications  Medication Sig Dispense Refill   acetaminophen (TYLENOL) 325 MG tablet Take 2 tablets (650 mg total) by mouth every 6 (six) hours as needed.     amLODipine (NORVASC) 10 MG tablet TAKE 1 TABLET BY MOUTH EVERY DAY 90 tablet 1   benzonatate (TESSALON) 100 MG capsule Take 1 capsule (100 mg total) by mouth every 8 (eight)  hours. 21 capsule 0   capecitabine (XELODA) 500 MG tablet Take 4 tablets (2,000 mg total) by mouth 2 (two) times daily after a meal. For 14 days, then off for 7 days. 192 tablet 0   ferrous sulfate 325 (65 FE) MG tablet TAKE 1 TABLET (325 MG TOTAL) BY MOUTH 2 (TWO) TIMES DAILY WITH A MEAL. 180 tablet 1   gabapentin (NEURONTIN) 300 MG capsule Take 1  capsule (300 mg total) by mouth 2 (two) times daily. 60 capsule 3   hydrALAZINE (APRESOLINE) 25 MG tablet TAKE 1 TABLET BY MOUTH EVERY 8 HOURS. 90 tablet 1   hydrochlorothiazide (HYDRODIURIL) 12.5 MG tablet Take 1 tablet (12.5 mg total) by mouth daily. 90 tablet 1   hydrocortisone (ANUSOL-HC) 2.5 % rectal cream Place 1 application rectally 2 (two) times daily. 30 g 0   Ibuprofen 200 MG CAPS Take 400 mg by mouth daily as needed (pain).     isosorbide mononitrate (IMDUR) 30 MG 24 hr tablet Take 1 tablet (30 mg total) by mouth daily. 90 tablet 0   lidocaine (LMX) 4 % cream Apply 1 application topically 3 (three) times daily as needed. 30 g 0   lisinopril (ZESTRIL) 40 MG tablet TAKE 1 TABLET BY MOUTH EVERY DAY 90 tablet 1   loperamide (IMODIUM) 2 MG capsule TAKE 1 CAPSULE (2 MG TOTAL) BY MOUTH AS NEEDED FOR DIARRHEA OR LOOSE STOOLS. 90 capsule 0   metoprolol tartrate (LOPRESSOR) 50 MG tablet TAKE 1 TABLET BY MOUTH TWICE A DAY 180 tablet 1   ondansetron (ZOFRAN ODT) 4 MG disintegrating tablet 53m ODT q4 hours prn nausea/vomit 20 tablet 0   ondansetron (ZOFRAN) 4 MG tablet Take 1 tablet (4 mg total) by mouth every 6 (six) hours as needed for nausea. 30 tablet 0   polycarbophil (FIBERCON) 625 MG tablet Take 1 tablet (625 mg total) by mouth daily. 30 tablet 0   No current facility-administered medications for this visit.   Facility-Administered Medications Ordered in Other Visits  Medication Dose Route Frequency Provider Last Rate Last Admin   sodium chloride flush (NS) 0.9 % injection 10 mL  10 mL Intracatheter PRN FTruitt Merle MD   10 mL at 06/06/20 1051   sodium chloride flush (NS) 0.9 % injection 10 mL  10 mL Intracatheter PRN FTruitt Merle MD   10 mL at 09/19/20 1114    PHYSICAL EXAMINATION: ECOG PERFORMANCE STATUS: 0 - Asymptomatic  Vitals:   09/19/20 0947  BP: (!) 109/59  Pulse: (!) 59  Resp: 18  Temp: 97.8 F (36.6 C)  SpO2: 100%   Filed Weights   09/19/20 0947  Weight: 231 lb 6.4 oz  (105 kg)    Due to COVID19 we will limit examination to appearance. Patient had no complaints.  GENERAL:alert, no distress and comfortable SKIN: skin color normal, no rashes or significant lesions EYES: normal, Conjunctiva are pink and non-injected, sclera clear  NEURO: alert & oriented x 3 with fluent speech   LABORATORY DATA:  I have reviewed the data as listed CBC Latest Ref Rng & Units 09/19/2020 08/27/2020 08/08/2020  WBC 4.0 - 10.5 K/uL 6.9 6.2 5.8  Hemoglobin 12.0 - 15.0 g/dL 10.6(L) 11.0(L) 10.2(L)  Hematocrit 36.0 - 46.0 % 33.9(L) 34.0(L) 30.9(L)  Platelets 150 - 400 K/uL 142(L) 158 157     CMP Latest Ref Rng & Units 09/19/2020 08/27/2020 08/08/2020  Glucose 70 - 99 mg/dL 92 67(L) 86  BUN 6 - 20 mg/dL 49(H) 28(H) 33(H)  Creatinine 0.44 -  1.00 mg/dL 1.65(H) 1.10(H) 1.11(H)  Sodium 135 - 145 mmol/L 137 138 137  Potassium 3.5 - 5.1 mmol/L 4.9 4.4 4.0  Chloride 98 - 111 mmol/L 112(H) 110 111  CO2 22 - 32 mmol/L 20(L) 20(L) 19(L)  Calcium 8.9 - 10.3 mg/dL 9.3 9.5 9.0  Total Protein 6.5 - 8.1 g/dL 7.2 7.6 7.6  Total Bilirubin 0.3 - 1.2 mg/dL 0.2(L) 0.6 0.5  Alkaline Phos 38 - 126 U/L 84 89 84  AST 15 - 41 U/L _0 ALT 0 - 44 U/L _1 RADIOGRAPHIC STUDIES: I have personally reviewed the radiological images as listed and agreed with the findings in the report. No results found.   ASSESSMENT & PLAN:  Casey Black is a 60 y.o. female with   1. 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 02/26/19 PET shows known liver metastasis and metastatic lymphadenopathy in chest and right axilla. -We previously discussed her cancer is stage IV and no longer eligible for surgery. The likelihood of curing this is very low due to her diffuse  metastasis, but still very treatable.  -[name] shows MSI stable disease, Kras+. There are no targetable mutation, she is not a candidate for EGFR inhibitor or immunotherapy. Avastin was added with start of full dose FOLFOX 3 weeks ago.  -I started her on first-line 5-FU for 2 cycles on 03/11/20 while her surgical wound healed. She started full dose FOLFOX with avastin q2weeks on 04/09/19 to control her disease. Due to neuropathy Oxaliplatin was stopped after 09/24/19. She is now on maintenance therapy. -For convenience I switched her maintenance therapy to oral chemo Xeloda 2087m BID 2 weeks on/1 week off and Zirabev q3weeks starting 01/21/20. Treatment held 05/01/20-05/12/20 due to COVID infection. -Her CT CAP from 08/25/20 shows stable to slight decrease in size of liver metastases, stable 3 mm right upper lobe lung nodule and Low right paratracheal lymph node is mildly increased in size. No new or progressive disease. I personally reviewed scan images and discussed with patient today. -I discussed if her tumor marker continues to increase, will repeat scan in 2 months. -She continues to tolerate Xeloda well with mild skin toxicity and stable neuropathy. Labs reviewed, Hg 10.6, plt 142K. Overall adequate to proceed with Zirabec today and continue Xeloda at same dose. She started current cycle tomorrow  -F/u in 3 weeks.  -plan to repeat scan in August    2. Chemotherapy induced peripheral neuropathy (CIPN), G1 -secondary to Oxaliplatin, developed after cycle 12 FOLFOX. Oxaliplatin d/c after C15 on 09/24/19 -Neuropathy in fingers and feet are moderate and stable with mild decreased function in fingers.  -She is currently on gabapentin 100 mg AM and 300 mg qHS and titrate up if needed.  -overall improved, no impact on her function now  -Will continue to monitor.    3. Nutrition/Anorexia -she reportedly weighed 253 lbs a few years ago, and weighed 203 lbs at symptom onset ~11/2018 -Continue to f/u  with dietician  -She plans to get complete dentures soon.  -She continues to gain weight back.    4. 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 Will give IV iron if needed  -Mild and  stable anemia lately.    5. HTN, uncontrolled  -on hydralazine, isosorbide, lisinopril, metoprolol, amlodipine and HCTZ  -Will monitor on bevacizumab.  -BP is lower today (09/19/20) at 136/65   6. Goals of care discussion, Social support -The patient understands the goal of care is palliative. She is full code now  -She worked in [employer] at 2 different jobs, but has been out of work lately due to her symptoms -She is not getting disability, no pay, but her employer kept her on their insurance plan -She may be eligible for Medicaid   7. COVID (+) 05/01/20 -She tested positive for COVID on 05/01/20. She received treatment infusion on 05/06/20. Symptoms overall resolved after treatment.  -Per patient she received 2 COVID vaccines. Will proceed with COVID booster    PLAN: -Labs reviewed and adequate to proceed with Zirabev today   -Continue current cycle Xeloda 202m BID 2 weeks on/1 week, start next cycle on 6/18  -Lab, flush, F/u and Zirabev in 3 and 6 weeks.   No problem-specific Assessment & Plan notes found for this encounter.   No orders of the defined types were placed in this encounter.  All questions were answered. The patient knows to call the clinic with any problems, questions or concerns.      YTruitt Merle MD 09/19/2020   I, AJoslyn Devon am acting as scribe for YTruitt Merle MD.   I have reviewed the above documentation for accuracy and completeness, and I agree with the above.

## 2020-09-17 ENCOUNTER — Other Ambulatory Visit: Payer: Self-pay | Admitting: Cardiovascular Disease

## 2020-09-17 ENCOUNTER — Other Ambulatory Visit: Payer: Self-pay | Admitting: Internal Medicine

## 2020-09-17 DIAGNOSIS — Z1231 Encounter for screening mammogram for malignant neoplasm of breast: Secondary | ICD-10-CM

## 2020-09-18 ENCOUNTER — Other Ambulatory Visit: Payer: Self-pay

## 2020-09-18 ENCOUNTER — Ambulatory Visit
Admission: RE | Admit: 2020-09-18 | Discharge: 2020-09-18 | Disposition: A | Discharge status: Home / Self Care | Payer: Medicaid Other | Source: Ambulatory Visit | Attending: Internal Medicine | Admitting: Internal Medicine

## 2020-09-18 DIAGNOSIS — Z1231 Encounter for screening mammogram for malignant neoplasm of breast: Secondary | ICD-10-CM | POA: Diagnosis not present

## 2020-09-19 ENCOUNTER — Inpatient Hospital Stay: Payer: Medicaid Other | Attending: Hematology | Admitting: Hematology

## 2020-09-19 ENCOUNTER — Inpatient Hospital Stay: Payer: Medicaid Other

## 2020-09-19 ENCOUNTER — Encounter: Payer: Self-pay | Admitting: Hematology

## 2020-09-19 ENCOUNTER — Other Ambulatory Visit: Payer: Self-pay | Admitting: Internal Medicine

## 2020-09-19 VITALS — BP 109/59 | HR 59 | Temp 97.8°F | Resp 18 | Ht 67.0 in | Wt 231.4 lb

## 2020-09-19 DIAGNOSIS — C189 Malignant neoplasm of colon, unspecified: Secondary | ICD-10-CM

## 2020-09-19 DIAGNOSIS — C184 Malignant neoplasm of transverse colon: Secondary | ICD-10-CM | POA: Diagnosis present

## 2020-09-19 DIAGNOSIS — C787 Secondary malignant neoplasm of liver and intrahepatic bile duct: Secondary | ICD-10-CM | POA: Diagnosis not present

## 2020-09-19 DIAGNOSIS — Z5112 Encounter for antineoplastic immunotherapy: Secondary | ICD-10-CM | POA: Diagnosis present

## 2020-09-19 LAB — CBC WITH DIFFERENTIAL (CANCER CENTER ONLY)
Abs Immature Granulocytes: 0.01 10*3/uL (ref 0.00–0.07)
Basophils Absolute: 0.1 10*3/uL (ref 0.0–0.1)
Basophils Relative: 1 %
Eosinophils Absolute: 0.3 10*3/uL (ref 0.0–0.5)
Eosinophils Relative: 4 %
HCT: 33.9 % — ABNORMAL LOW (ref 36.0–46.0)
Hemoglobin: 10.6 g/dL — ABNORMAL LOW (ref 12.0–15.0)
Immature Granulocytes: 0 %
Lymphocytes Relative: 30 %
Lymphs Abs: 2 10*3/uL (ref 0.7–4.0)
MCH: 33.5 pg (ref 26.0–34.0)
MCHC: 31.3 g/dL (ref 30.0–36.0)
MCV: 107.3 fL — ABNORMAL HIGH (ref 80.0–100.0)
Monocytes Absolute: 0.6 10*3/uL (ref 0.1–1.0)
Monocytes Relative: 8 %
Neutro Abs: 3.9 10*3/uL (ref 1.7–7.7)
Neutrophils Relative %: 57 %
Platelet Count: 142 10*3/uL — ABNORMAL LOW (ref 150–400)
RBC: 3.16 MIL/uL — ABNORMAL LOW (ref 3.87–5.11)
RDW: 16.7 % — ABNORMAL HIGH (ref 11.5–15.5)
WBC Count: 6.9 10*3/uL (ref 4.0–10.5)
nRBC: 0 % (ref 0.0–0.2)

## 2020-09-19 LAB — CMP (CANCER CENTER ONLY)
ALT: 15 U/L (ref 0–44)
AST: 21 U/L (ref 15–41)
Albumin: 3.5 g/dL (ref 3.5–5.0)
Alkaline Phosphatase: 84 U/L (ref 38–126)
Anion gap: 5 (ref 5–15)
BUN: 49 mg/dL — ABNORMAL HIGH (ref 6–20)
CO2: 20 mmol/L — ABNORMAL LOW (ref 22–32)
Calcium: 9.3 mg/dL (ref 8.9–10.3)
Chloride: 112 mmol/L — ABNORMAL HIGH (ref 98–111)
Creatinine: 1.65 mg/dL — ABNORMAL HIGH (ref 0.44–1.00)
GFR, Estimated: 35 mL/min — ABNORMAL LOW (ref >60)
Glucose, Bld: 92 mg/dL (ref 70–99)
Potassium: 4.9 mmol/L (ref 3.5–5.1)
Sodium: 137 mmol/L (ref 135–145)
Total Bilirubin: 0.2 mg/dL — ABNORMAL LOW (ref 0.3–1.2)
Total Protein: 7.2 g/dL (ref 6.5–8.1)

## 2020-09-19 LAB — TOTAL PROTEIN, URINE DIPSTICK: Protein, ur: NEGATIVE mg/dL

## 2020-09-19 MED ORDER — SODIUM CHLORIDE 0.9 % IV SOLN
7.5000 mg/kg | Freq: Once | INTRAVENOUS | Status: AC
  Administered 2020-09-19: 800 mg via INTRAVENOUS
  Filled 2020-09-19: qty 32

## 2020-09-19 MED ORDER — SODIUM CHLORIDE 0.9 % IV SOLN
Freq: Once | INTRAVENOUS | Status: AC
  Filled 2020-09-19: qty 250

## 2020-09-19 MED ORDER — HEPARIN SOD (PORK) LOCK FLUSH 100 UNIT/ML IV SOLN
500.0000 [IU] | Freq: Once | INTRAVENOUS | Status: AC | PRN
  Administered 2020-09-19: 500 [IU]
  Filled 2020-09-19: qty 5

## 2020-09-19 MED ORDER — SODIUM CHLORIDE 0.9% FLUSH
10.0000 mL | INTRAVENOUS | Status: DC | PRN
  Administered 2020-09-19: 10 mL
  Filled 2020-09-19: qty 10

## 2020-09-19 NOTE — Patient Instructions (Signed)
[facility] MEDICAL [facility]COLOG[facility]  Discharge Instructions: °Thank you for choosing [facility] to provide your oncology and hematology care.  ° °If you have a lab appointment with the Cancer Center, please go directly to the Cancer Center and check in at the registration area. °  °Wear comfortable clothing and clothing appropriate for easy access to any Portacath or PICC line.  ° °We strive to give you quality time with your provider. You may need to reschedule your appointment if you arrive late (15 or more minutes).  Arriving late affects you and other patients whose appointments are after yours.  Also, if you miss three or more appointments without notifying the office, you may be dismissed from the clinic at the provider’s discretion.    °  °For prescription refill requests, have your pharmacy contact our office and allow 72 hours for refills to be completed.   ° °Today you received the following chemotherapy and/or immunotherapy agents: bevacizumab    °  °To help prevent nausea and vomiting after your treatment, we encourage you to take your nausea medication as directed. ° °BELOW ARE SYMPTOMS THAT SHOULD BE REPORTED IMMEDIATELY: °*FEVER GREATER THAN 100.4 F (38 °C) OR HIGHER °*CHILLS OR SWEATING °*NAUSEA AND VOMITING THAT IS NOT CONTROLLED WITH YOUR NAUSEA MEDICATION °*UNUSUAL SHORTNESS OF BREATH °*UNUSUAL BRUISING OR BLEEDING °*URINARY PROBLEMS (pain or burning when urinating, or frequent urination) °*BOWEL PROBLEMS (unusual diarrhea, constipation, pain near the anus) °TENDERNESS IN MOUTH AND THROAT WITH OR WITHOUT PRESENCE OF ULCERS (sore throat, sores in mouth, or a toothache) °UNUSUAL RASH, SWELLING OR PAIN  °UNUSUAL VAGINAL DISCHARGE OR ITCHING  ° °Items with * indicate a potential emergency and should be followed up as soon as possible or go to the Emergency Department if any problems should occur. ° °Please show the CHEMOTHERAPY ALERT CARD or IMMUNOTHERAPY ALERT CARD at check-in  to the Emergency Department and triage nurse. ° °Should you have questions after your visit or need to cancel or reschedule your appointment, please contact [facility]  Dept: [phone]  and follow the prompts.  Office hours are 8:00 a.m. to 4:30 p.m. Monday - Friday. Please note that voicemails left after 4:00 p.m. may not be returned until the following business day.  We are closed weekends and major holidays. You have access to a nurse at all times for urgent questions. Please call the main number to the clinic Dept: [phone] and follow the prompts. ° ° °For any non-urgent questions, you may also contact your provider using MyChart. We now offer e-Visits for anyone 18 and older to request care online for non-urgent symptoms. For details visit [email or web address]. °  °Also download the MyChart app! Go to the app store, search "MyChart", open the app, select [facility], and log in with your MyChart username and password. ° °Due to Covid, a mask is required upon entering the hospital/clinic. If you do not have a mask, one will be given to you upon arrival. For doctor visits, patients may have 1 support person aged 18 or older with them. For treatment visits, patients cannot have anyone with them due to current Covid guidelines and our immunocompromised population.  ° °

## 2020-09-23 ENCOUNTER — Telehealth: Payer: Self-pay | Admitting: Hematology

## 2020-09-23 ENCOUNTER — Telehealth: Payer: Self-pay | Admitting: Nurse Practitioner

## 2020-09-23 ENCOUNTER — Telehealth: Payer: Self-pay | Admitting: *Deleted

## 2020-09-23 ENCOUNTER — Other Ambulatory Visit: Payer: Self-pay | Admitting: *Deleted

## 2020-09-23 DIAGNOSIS — C9000 Multiple myeloma not having achieved remission: Secondary | ICD-10-CM

## 2020-09-23 LAB — CEA (IN HOUSE-CHCC): CEA (CHCC-In House): 302.64 ng/mL — ABNORMAL HIGH (ref 0.00–5.00)

## 2020-09-23 NOTE — Telephone Encounter (Signed)
Called pt & informed of lab result per Cira Rue NP & pt reports nothing new.  She states she has been drinking a lot but not water.  Encouraged increase of fluids/water & schedule message for pt to return Thurs or Fri for repeat lab.  Pt expressed understanding.

## 2020-09-23 NOTE — Telephone Encounter (Signed)
Scheduled follow-up appointment per 6/17 los. Patient is aware. 

## 2020-09-23 NOTE — Telephone Encounter (Signed)
-----   Message from [name], NP sent at 09/22/2020  7:35 AM EDT ----- Please let her know creatinine increased from her baseline. Any changes or new issues? Encourage her to increase liquid intake. Please send schedule message to repeat this Thursday or Friday.  Thanks, Regan Rakers, NP

## 2020-09-23 NOTE — Telephone Encounter (Signed)
Scheduled appt per 6/21 sch msg. Pt aware.

## 2020-09-25 ENCOUNTER — Inpatient Hospital Stay: Payer: Medicaid Other

## 2020-09-25 ENCOUNTER — Other Ambulatory Visit: Payer: Self-pay

## 2020-09-25 DIAGNOSIS — C189 Malignant neoplasm of colon, unspecified: Secondary | ICD-10-CM

## 2020-09-25 DIAGNOSIS — C787 Secondary malignant neoplasm of liver and intrahepatic bile duct: Secondary | ICD-10-CM

## 2020-09-25 DIAGNOSIS — C9000 Multiple myeloma not having achieved remission: Secondary | ICD-10-CM

## 2020-09-25 DIAGNOSIS — Z5112 Encounter for antineoplastic immunotherapy: Secondary | ICD-10-CM | POA: Diagnosis not present

## 2020-09-25 LAB — CBC WITH DIFFERENTIAL (CANCER CENTER ONLY)
Abs Immature Granulocytes: 0.01 10*3/uL (ref 0.00–0.07)
Basophils Absolute: 0 10*3/uL (ref 0.0–0.1)
Basophils Relative: 1 %
Eosinophils Absolute: 0.3 10*3/uL (ref 0.0–0.5)
Eosinophils Relative: 5 %
HCT: 35.1 % — ABNORMAL LOW (ref 36.0–46.0)
Hemoglobin: 11.4 g/dL — ABNORMAL LOW (ref 12.0–15.0)
Immature Granulocytes: 0 %
Lymphocytes Relative: 32 %
Lymphs Abs: 1.8 10*3/uL (ref 0.7–4.0)
MCH: 34.8 pg — ABNORMAL HIGH (ref 26.0–34.0)
MCHC: 32.5 g/dL (ref 30.0–36.0)
MCV: 107 fL — ABNORMAL HIGH (ref 80.0–100.0)
Monocytes Absolute: 0.4 10*3/uL (ref 0.1–1.0)
Monocytes Relative: 6 %
Neutro Abs: 3.2 10*3/uL (ref 1.7–7.7)
Neutrophils Relative %: 56 %
Platelet Count: 172 10*3/uL (ref 150–400)
RBC: 3.28 MIL/uL — ABNORMAL LOW (ref 3.87–5.11)
RDW: 16.9 % — ABNORMAL HIGH (ref 11.5–15.5)
WBC Count: 5.7 10*3/uL (ref 4.0–10.5)
nRBC: 0.4 % — ABNORMAL HIGH (ref 0.0–0.2)

## 2020-09-25 LAB — CMP (CANCER CENTER ONLY)
ALT: 10 U/L (ref 0–44)
AST: 17 U/L (ref 15–41)
Albumin: 3.5 g/dL (ref 3.5–5.0)
Alkaline Phosphatase: 79 U/L (ref 38–126)
Anion gap: 6 (ref 5–15)
BUN: 25 mg/dL — ABNORMAL HIGH (ref 6–20)
CO2: 20 mmol/L — ABNORMAL LOW (ref 22–32)
Calcium: 9.7 mg/dL (ref 8.9–10.3)
Chloride: 111 mmol/L (ref 98–111)
Creatinine: 1.01 mg/dL — ABNORMAL HIGH (ref 0.44–1.00)
GFR, Estimated: >60 mL/min (ref >60)
Glucose, Bld: 92 mg/dL (ref 70–99)
Potassium: 4.8 mmol/L (ref 3.5–5.1)
Sodium: 137 mmol/L (ref 135–145)
Total Bilirubin: 0.4 mg/dL (ref 0.3–1.2)
Total Protein: 7.9 g/dL (ref 6.5–8.1)

## 2020-09-30 ENCOUNTER — Other Ambulatory Visit (HOSPITAL_COMMUNITY): Payer: Self-pay

## 2020-10-02 ENCOUNTER — Other Ambulatory Visit (HOSPITAL_COMMUNITY): Payer: Self-pay

## 2020-10-02 ENCOUNTER — Other Ambulatory Visit: Payer: Self-pay | Admitting: Pharmacist

## 2020-10-02 ENCOUNTER — Other Ambulatory Visit: Payer: Self-pay | Admitting: Hematology

## 2020-10-02 DIAGNOSIS — Z933 Colostomy status: Secondary | ICD-10-CM | POA: Diagnosis not present

## 2020-10-02 DIAGNOSIS — T8131XD Disruption of external operation (surgical) wound, not elsewhere classified, subsequent encounter: Secondary | ICD-10-CM | POA: Diagnosis not present

## 2020-10-02 DIAGNOSIS — C189 Malignant neoplasm of colon, unspecified: Secondary | ICD-10-CM

## 2020-10-02 DIAGNOSIS — K56609 Unspecified intestinal obstruction, unspecified as to partial versus complete obstruction: Secondary | ICD-10-CM | POA: Diagnosis not present

## 2020-10-02 DIAGNOSIS — S31109A Unspecified open wound of abdominal wall, unspecified quadrant without penetration into peritoneal cavity, initial encounter: Secondary | ICD-10-CM | POA: Diagnosis not present

## 2020-10-02 MED ORDER — CAPECITABINE 500 MG PO TABS
2000.0000 mg | ORAL_TABLET | Freq: Two times a day (BID) | ORAL | 0 refills | Status: DC
  Filled 2020-10-02: qty 112, 14d supply, fill #0
  Filled 2020-10-08: qty 112, 21d supply, fill #0

## 2020-10-02 MED ORDER — CAPECITABINE 500 MG PO TABS
2000.0000 mg | ORAL_TABLET | Freq: Two times a day (BID) | ORAL | 0 refills | Status: DC
  Filled 2020-10-02: qty 192, 21d supply, fill #0

## 2020-10-02 NOTE — Progress Notes (Signed)
Oral Oncology Pharmacist Encounter  Prescription quantity for refill for Xeloda (capecitabine) updated from #192 to #112 to reflect patient's current Xeloda regimen. Prescription redirected back to [facility].  Leron Croak, PharmD, BCPS Hematology/Oncology Clinical Pharmacist [facility] [phone] 10/02/2020 2:47 PM

## 2020-10-06 ENCOUNTER — Other Ambulatory Visit: Payer: Self-pay | Admitting: Hematology

## 2020-10-06 NOTE — Progress Notes (Signed)
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 Bowel-gas pattern consistent with small bowel obstruction. No free air. 2. No acute chest findings.   01/23/2019 Imaging   CT AP IMPRESSION: Obstructing mid transverse colonic mass with mild regional adenopathy and hepatic metastatic disease. The mass likely extends through the serosa; no ascites or peritoneal nodularity.   01/24/2019 Surgery   Surgeon: Casey Riley MD Assistant: Casey Latino PA-C Procedure performed: Transverse colectomy with end colostomy, liver biopsy Procedure classification: URGENT/EMERGENT Preop diagnosis: Obstructing, metastatic transverse colon mass Post-op diagnosis/intraop findings: Same   01/24/2019 Pathology Results   FINAL MICROSCOPIC DIAGNOSIS:   A. COLON, TRANSVERSE, RESECTION:  Colonic adenocarcinoma, 5 cm.  Carcinoma extends into pericolonic connective tissue and focally to  serosal surface.  Margins not involved.  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   B. LIVER NODULE, LEFT, BIOPSY:  Metastatic adenocarcinoma.    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 Hypermetabolic metastatic disease in the liver and mediastinal/hilar/axillary lymph nodes. 2. Focal hypermetabolism in the rectum. Continued attention on follow-up exams is warranted. 3. Focal hypermetabolism medial to the right adrenal gland may be within a metastatic lymph node, better visualized on 01/23/2019. 4. Aortic atherosclerosis (ICD10-170.0). Coronary artery calcification.   03/12/2019 -  Chemotherapy   She started 5FU q2weeks on 03/12/19 for 2 cycles. She started full dose FOLFOX with Avastin on 04/09/19. Oxaliplatin dose reduced repeatedly due to neuropathy C12 and held since C16 on 10/09/19. Now on maintenance Avastin and 5FU q2weeks since 10/09/19       -Maintenance change to maintenance xeloda 2000 mg BID days 1-14 q21 days and q3 weeks Zirabev (15 mg/kg) starting 01/21/20. First cycle was taken 1036m BID due to misunderstanding.    05/31/2019 Imaging   Restaging CT CAP IMPRESSION: 1. Similar to mild interval decrease in size of multiple hepatic lesions, partially calcified. 2. 2 mm right upper lobe pulmonary nodule. Recommend attention on follow-up. 3. Emphysema and aortic atherosclerosis.   08/23/2019 Imaging   CT CAP w contrast  IMPRESSION: 1. The dominant peripheral right liver metastasis has mildly increased. Other smaller liver metastases are stable. 2. Otherwise no new or progressive metastatic disease in the chest, abdomen or pelvis. 3. Aortic Atherosclerosis (ICD10-I70.0) and Emphysema (ICD10-J43.9).   11/27/2019 Imaging   CT CAP w contrast  IMPRESSION: Status post transverse colectomy with right mid abdominal colostomy.   Mildly progressive hepatic metastases, as above.   No evidence of metastatic disease in the  chest. Small mediastinal lymph  nodes are within normal limits.   Additional stable ancillary findings as above.   02/21/2020 Imaging   IMPRESSION: 1. Stable hepatic metastatic disease. 2. Aortic atherosclerosis (ICD10-I70.0). Coronary artery calcification. 3.  Emphysema (ICD10-J43.9).   05/19/2020 Imaging   CT CAP  IMPRESSION: 1. Multiple partially calcified liver metastases are again noted. With the exception of a small lesion in segment 7/8 lesions are not significantly changed in the interval. No new liver lesions identified. 2. Coronary artery atherosclerotic calcifications. 3. Aortic atherosclerosis. 4. 2 mm right upper lobe lung nodule identified.  Unchanged.   Aortic Atherosclerosis (ICD10-I70.0).     CURRENT THERAPY: Maintenance Avastin and 5FU q2weeks since 10/09/19, change to maintenance Xeloda 2000 mg BID days 1-14 q21 days and q3 weeks avastin (15 mg/kg) starting 01/21/20. First cycle was taken 1046m BID due to misunderstanding. Full treatment held after 04/15/20 due to CL[place] Restarted Xeloda on 05/13/20.   INTERVAL HISTORY: Ms. SBohlinreturns for follow up and treatment as scheduled. She was last seen 09/19/20 and completed another cycle of avastin. She is on her week off Xeloda, plans to start in 2 days or so.  She is doing very well, no changes.  Gabapentin 300 mg twice daily takes away the tingling in her fingers.  Hands remain dark, she manages and functions well.  Eating and drinking well, energy is adequate, denies mucositis, diarrhea, pain, rash, fever, chills, or other new concerns.   MEDICAL HISTORY:  Past Medical History:  Diagnosis Date   Anemia    low iron   Cancer (HCC)    colon cancer   [place] 01[place]   Hypertension 01/23/2019   Personal history of chemotherapy 01/2019   colon CA   [place]) [place] 01/23/[place]    SURGICAL HISTORY: Past Surgical History:  Procedure Laterality Date   CESAREAN SECTION     x2   COLOSTOMY N/A 01/24/2019   Procedure:  End Loop Colostomy;  Surgeon: CClovis Riley MD;  [facility];  Service: General;  Laterality: N/A;   PARTIAL COLECTOMY N/A 01/24/2019   Procedure: PARTIAL COLECTOMY;  Surgeon: CClovis Riley MD;  [facility];  Service: General;  Laterality: N/A;   PORTACATH PLACEMENT Right 02/28/2019   Procedure: INSERTION PORT-A-CATH WITH ULTRASOUND GUIDANCE;  Surgeon: CClovis Riley MD;  [place]  Service: General;  Laterality: Right;    I have reviewed the social history and family history with the patient and they are unchanged from previous note.  ALLERGIES:  has No Known Allergies.  MEDICATIONS:  Current Outpatient Medications  Medication Sig Dispense Refill   acetaminophen (TYLENOL) 325 MG tablet Take 2 tablets (650 mg total) by mouth every 6 (six) hours as needed.     amLODipine (NORVASC) 10 MG tablet TAKE 1 TABLET BY MOUTH EVERY DAY 90 tablet 1   benzonatate (TESSALON) 100 MG capsule Take 1 capsule (100 mg total) by mouth every 8 (eight) hours. 21 capsule 0   capecitabine (XELODA) 500 MG tablet Take 4 tablets (2,000 mg total) by mouth 2 (two) times daily after a meal. For 14 days, then off for 7 days. 112 tablet 0   ferrous sulfate 325 (65 FE) MG tablet TAKE 1 TABLET (325 MG TOTAL) BY MOUTH 2 (TWO) TIMES DAILY WITH A MEAL. 180 tablet 1   gabapentin (NEURONTIN) 300 MG capsule Take 1 capsule (300 mg total) by mouth 2 (two) times daily. 60 capsule 3   hydrALAZINE (APRESOLINE)  25 MG tablet TAKE 1 TABLET BY MOUTH EVERY 8 HOURS 90 tablet 1   hydrochlorothiazide (HYDRODIURIL) 12.5 MG tablet Take 1 tablet (12.5 mg total) by mouth daily. 90 tablet 1   hydrocortisone (ANUSOL-HC) 2.5 % rectal cream Place 1 application rectally 2 (two) times daily. 30 g 0   Ibuprofen 200 MG CAPS Take 400 mg by mouth daily as needed (pain).     isosorbide mononitrate (IMDUR) 30 MG 24 hr tablet Take 1 tablet (30 mg total) by mouth daily. 90 tablet 0   lidocaine (LMX) 4 % cream Apply 1 application  topically 3 (three) times daily as needed. 30 g 0   lisinopril (ZESTRIL) 40 MG tablet TAKE 1 TABLET BY MOUTH EVERY DAY 90 tablet 1   loperamide (IMODIUM) 2 MG capsule TAKE 1 CAPSULE (2 MG TOTAL) BY MOUTH AS NEEDED FOR DIARRHEA OR LOOSE STOOLS. 90 capsule 0   metoprolol tartrate (LOPRESSOR) 50 MG tablet TAKE 1 TABLET BY MOUTH TWICE A DAY 180 tablet 1   ondansetron (ZOFRAN ODT) 4 MG disintegrating tablet 44m ODT q4 hours prn nausea/vomit 20 tablet 0   ondansetron (ZOFRAN) 4 MG tablet Take 1 tablet (4 mg total) by mouth every 6 (six) hours as needed for nausea. 30 tablet 0   polycarbophil (FIBERCON) 625 MG tablet Take 1 tablet (625 mg total) by mouth daily. 30 tablet 0   No current facility-administered medications for this visit.   Facility-Administered Medications Ordered in Other Visits  Medication Dose Route Frequency Provider Last Rate Last Admin   sodium chloride flush (NS) 0.9 % injection 10 mL  10 mL Intracatheter PRN FTruitt Merle MD   10 mL at 06/06/20 1051    PHYSICAL EXAMINATION: ECOG PERFORMANCE STATUS: 1 - Symptomatic but completely ambulatory  Vitals:   10/08/20 0944  BP: (!) 117/59  Pulse: (!) 56  Resp: 19  Temp: 97.9 F (36.6 C)  SpO2: 100%   Filed Weights   10/08/20 0944  Weight: 230 lb 12.8 oz (104.7 kg)    GENERAL:alert, no distress and comfortable SKIN: no rash. Palms dry with hyperpigmentation  EYES:  sclera clear OROPHARYNX: No thrush or ulcers LUNGS:  normal breathing effort HEART:  no lower extremity edema NEURO: alert & oriented x 3 with fluent speech, no focal motor deficits PAC without erythema  LABORATORY DATA:  I have reviewed the data as listed CBC Latest Ref Rng & Units 10/08/2020 09/25/2020 09/19/2020  WBC 4.0 - 10.5 K/uL 6.6 5.7 6.9  Hemoglobin 12.0 - 15.0 g/dL 10.9(L) 11.4(L) 10.6(L)  Hematocrit 36.0 - 46.0 % 33.7(L) 35.1(L) 33.9(L)  Platelets 150 - 400 K/uL 169 172 142(L)     CMP Latest Ref Rng & Units 09/25/2020 09/19/2020 08/27/2020   Glucose 70 - 99 mg/dL 92 92 67(L)  BUN 6 - 20 mg/dL 25(H) 49(H) 28(H)  Creatinine 0.44 - 1.00 mg/dL 1.01(H) 1.65(H) 1.10(H)  Sodium 135 - 145 mmol/L 137 137 138  Potassium 3.5 - 5.1 mmol/L 4.8 4.9 4.4  Chloride 98 - 111 mmol/L 111 112(H) 110  CO2 22 - 32 mmol/L 20(L) 20(L) 20(L)  Calcium 8.9 - 10.3 mg/dL 9.7 9.3 9.5  Total Protein 6.5 - 8.1 g/dL 7.9 7.2 7.6  Total Bilirubin 0.3 - 1.2 mg/dL 0.4 0.2(L) 0.6  Alkaline Phos 38 - 126 U/L 79 84 89  AST 15 - 41 U/L 17 21 18   ALT 0 - 44 U/L 10 15 10       RADIOGRAPHIC STUDIES: I have personally reviewed the radiological  images as listed and agreed with the findings in the report. No results found.   ASSESSMENT & PLAN: Casey Black is a 60 y.o. female with   1. 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 -PET from 02/26/19 shows known liver metastasis and metastatic lymphadenopathy in chest and right axilla. -[name] shows MSI stable disease, Kras+ No targetable mutation, she is not a candidate for EGFR or immunotherapy. -Began palliative first line chemo on 03/12/2019, received 5FU/leuc with first 2 cycles for large open abdominal wound after surgery. She started full dose FOLFOX and avastin with cycle 2. Due to neuropathy Oxaliplatin was stopped after 09/24/19, she has been on maintenance therapy.  -For convenience she was switched to oral chemo Xeloda 2 weeks on/1 week off and 0 Bev every 3 weeks in 01/21/2020.  Treatment held 05/01/2020 - 05/12/2020 due to COVID infection -CEA fluctuates, last imaging 08/25/2020 showed stable to slight decrease in liver metastases, stable right upper lobe lung nodule and low right paratracheal lymph node is mildly increased.  No new or progressive disease.  She continues maintenance -Plan to  restage in August  2. Chemotherapy induced peripheral neuropathy (CIPN), G2 -secondary to Oxaliplatin, developed after cycle 12 FOLFOX, oxalic DC after cycle 15 on 09/24/2019 -Improved on gabapentin 300 mg twice daily -Able to function well   3. Nutrition/Anorexia -she reportedly weighed 253 lbs a few years ago, and weighed 203 lbs at symptom onset ~11/2018 - continue follow-up with dietitian -Nutrition and weight are adequate   4. 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 HTN -Initially on hydralazine, isosorbide, lisinopril, metoprolol.  -Avastin and anxiety contributing to BP elevation in clinic. Received clonidine PRN on chemo days. -Added amlodipine and HCTZ with improvement. No longer on metoprolol. -BP slightly lower lately   6. Goals of care discussion  -She understands her cancer is not curable, but treatable, goal is palliative to control disease and prolong her life.  -Responding well and tolerating treatment -Full code   Disposition: Ms. Genoveva Ill appears stable.  She continues q3 week bevacizumab and Xeloda 2000 mg twice daily for 2 weeks on/1 week off.  She tolerates treatment well with mild neuropathy and hyperpigmentation, otherwise no significant toxicities.  She is able to recover and function well.  There is no clinical evidence of disease progression.  Labs reviewed, adequate for treatment. Will give hydration for Scr 1.44. We will f/up on the pending CEA from today. Proceed with bevacizumab today as planned. Begin next Xeloda cycle on 10/10/20 at same dose, for 2 weeks on and 1 week off. She will be out of town starting 10/23/20.   Return for lab, f/up, and next beva on 7/29. I have ordered restaging scans for August.    Orders Placed This Encounter  Procedures   CT CHEST ABDOMEN PELVIS W CONTRAST    Standing Status:   Future    Standing Expiration Date:    10/08/2021    Order Specific Question:   If indicated for the ordered procedure, I authorize the administration of contrast media per Radiology protocol    Answer:   Yes    Order Specific Question:   Is patient pregnant?    Answer:   No  Order Specific Question:   Preferred imaging location?    Answer:   [facility]    Order Specific Question:   Is Oral Contrast requested for this exam?    Answer:   Yes, Per Radiology protocol    Order Specific Question:   Reason for Exam (SYMPTOM  OR DIAGNOSIS REQUIRED)    Answer:   metastatic colon cancer, evaluate response to chemo    All questions were answered. The patient knows to call the clinic with any problems, questions or concerns. No barriers to learning were detected.     Casey Feeling, NP 10/08/20

## 2020-10-08 ENCOUNTER — Encounter: Payer: Self-pay | Admitting: Nurse Practitioner

## 2020-10-08 ENCOUNTER — Other Ambulatory Visit: Payer: Self-pay

## 2020-10-08 ENCOUNTER — Inpatient Hospital Stay (HOSPITAL_BASED_OUTPATIENT_CLINIC_OR_DEPARTMENT_OTHER): Payer: Medicaid Other | Admitting: Nurse Practitioner

## 2020-10-08 ENCOUNTER — Inpatient Hospital Stay: Payer: Medicaid Other

## 2020-10-08 ENCOUNTER — Inpatient Hospital Stay: Payer: Medicaid Other | Attending: Nurse Practitioner

## 2020-10-08 ENCOUNTER — Other Ambulatory Visit (HOSPITAL_COMMUNITY): Payer: Self-pay

## 2020-10-08 VITALS — BP 117/59 | HR 56 | Temp 97.9°F | Resp 19 | Ht 67.0 in | Wt 230.8 lb

## 2020-10-08 DIAGNOSIS — R944 Abnormal results of kidney function studies: Secondary | ICD-10-CM | POA: Insufficient documentation

## 2020-10-08 DIAGNOSIS — Z95828 Presence of other vascular implants and grafts: Secondary | ICD-10-CM

## 2020-10-08 DIAGNOSIS — Z5112 Encounter for antineoplastic immunotherapy: Secondary | ICD-10-CM | POA: Diagnosis not present

## 2020-10-08 DIAGNOSIS — C787 Secondary malignant neoplasm of liver and intrahepatic bile duct: Secondary | ICD-10-CM

## 2020-10-08 DIAGNOSIS — C189 Malignant neoplasm of colon, unspecified: Secondary | ICD-10-CM

## 2020-10-08 DIAGNOSIS — C184 Malignant neoplasm of transverse colon: Secondary | ICD-10-CM | POA: Diagnosis present

## 2020-10-08 LAB — CMP (CANCER CENTER ONLY)
ALT: 8 U/L (ref 0–44)
AST: 16 U/L (ref 15–41)
Albumin: 3.3 g/dL — ABNORMAL LOW (ref 3.5–5.0)
Alkaline Phosphatase: 95 U/L (ref 38–126)
Anion gap: 7 (ref 5–15)
BUN: 35 mg/dL — ABNORMAL HIGH (ref 6–20)
CO2: 18 mmol/L — ABNORMAL LOW (ref 22–32)
Calcium: 9.5 mg/dL (ref 8.9–10.3)
Chloride: 114 mmol/L — ABNORMAL HIGH (ref 98–111)
Creatinine: 1.44 mg/dL — ABNORMAL HIGH (ref 0.44–1.00)
GFR, Estimated: 42 mL/min — ABNORMAL LOW (ref >60)
Glucose, Bld: 112 mg/dL — ABNORMAL HIGH (ref 70–99)
Potassium: 4.6 mmol/L (ref 3.5–5.1)
Sodium: 139 mmol/L (ref 135–145)
Total Bilirubin: 0.3 mg/dL (ref 0.3–1.2)
Total Protein: 7.5 g/dL (ref 6.5–8.1)

## 2020-10-08 LAB — CBC WITH DIFFERENTIAL (CANCER CENTER ONLY)
Abs Immature Granulocytes: 0.02 10*3/uL (ref 0.00–0.07)
Basophils Absolute: 0 10*3/uL (ref 0.0–0.1)
Basophils Relative: 1 %
Eosinophils Absolute: 0.2 10*3/uL (ref 0.0–0.5)
Eosinophils Relative: 3 %
HCT: 33.7 % — ABNORMAL LOW (ref 36.0–46.0)
Hemoglobin: 10.9 g/dL — ABNORMAL LOW (ref 12.0–15.0)
Immature Granulocytes: 0 %
Lymphocytes Relative: 33 %
Lymphs Abs: 2.2 10*3/uL (ref 0.7–4.0)
MCH: 34.5 pg — ABNORMAL HIGH (ref 26.0–34.0)
MCHC: 32.3 g/dL (ref 30.0–36.0)
MCV: 106.6 fL — ABNORMAL HIGH (ref 80.0–100.0)
Monocytes Absolute: 0.8 10*3/uL (ref 0.1–1.0)
Monocytes Relative: 11 %
Neutro Abs: 3.4 10*3/uL (ref 1.7–7.7)
Neutrophils Relative %: 52 %
Platelet Count: 169 10*3/uL (ref 150–400)
RBC: 3.16 MIL/uL — ABNORMAL LOW (ref 3.87–5.11)
RDW: 17.4 % — ABNORMAL HIGH (ref 11.5–15.5)
WBC Count: 6.6 10*3/uL (ref 4.0–10.5)
nRBC: 0 % (ref 0.0–0.2)

## 2020-10-08 LAB — CEA (IN HOUSE-CHCC): CEA (CHCC-In House): 233.58 ng/mL — ABNORMAL HIGH (ref 0.00–5.00)

## 2020-10-08 LAB — TOTAL PROTEIN, URINE DIPSTICK: Protein, ur: 30 mg/dL — AB

## 2020-10-08 MED ORDER — SODIUM CHLORIDE 0.9 % IV SOLN
INTRAVENOUS | Status: AC
  Filled 2020-10-08 (×2): qty 250

## 2020-10-08 MED ORDER — HEPARIN SOD (PORK) LOCK FLUSH 100 UNIT/ML IV SOLN
500.0000 [IU] | Freq: Once | INTRAVENOUS | Status: DC | PRN
  Filled 2020-10-08: qty 5

## 2020-10-08 MED ORDER — SODIUM CHLORIDE 0.9 % IV SOLN
Freq: Once | INTRAVENOUS | Status: DC
  Filled 2020-10-08: qty 250

## 2020-10-08 MED ORDER — SODIUM CHLORIDE 0.9% FLUSH
10.0000 mL | INTRAVENOUS | Status: DC | PRN
  Administered 2020-10-08: 10 mL
  Filled 2020-10-08: qty 10

## 2020-10-08 MED ORDER — SODIUM CHLORIDE 0.9 % IV SOLN
Freq: Once | INTRAVENOUS | Status: AC
Start: 2020-10-08 — End: 2020-10-08
  Filled 2020-10-08: qty 250

## 2020-10-08 MED ORDER — SODIUM CHLORIDE 0.9% FLUSH
10.0000 mL | INTRAVENOUS | Status: DC | PRN
  Filled 2020-10-08: qty 10

## 2020-10-08 MED ORDER — SODIUM CHLORIDE 0.9 % IV SOLN
7.5000 mg/kg | Freq: Once | INTRAVENOUS | Status: AC
  Administered 2020-10-08: 800 mg via INTRAVENOUS
  Filled 2020-10-08: qty 32

## 2020-10-08 NOTE — Patient Instructions (Signed)
[facility] MEDICAL [facility]COLOGY  Discharge Instructions: °Thank you for choosing [facility] to provide your oncology and hematology care.  ° °If you have a lab appointment with the Cancer Center, please go directly to the Cancer Center and check in at the registration area. °  °Wear comfortable clothing and clothing appropriate for easy access to any Portacath or PICC line.  ° °We strive to give you quality time with your provider. You may need to reschedule your appointment if you arrive late (15 or more minutes).  Arriving late affects you and other patients whose appointments are after yours.  Also, if you miss three or more appointments without notifying the office, you may be dismissed from the clinic at the provider’s discretion.    °  °For prescription refill requests, have your pharmacy contact our office and allow 72 hours for refills to be completed.   ° °Today you received the following chemotherapy and/or immunotherapy agents: bevacizumab    °  °To help prevent nausea and vomiting after your treatment, we encourage you to take your nausea medication as directed. ° °BELOW ARE SYMPTOMS THAT SHOULD BE REPORTED IMMEDIATELY: °*FEVER GREATER THAN 100.4 F (38 °C) OR HIGHER °*CHILLS OR SWEATING °*NAUSEA AND VOMITING THAT IS NOT CONTROLLED WITH YOUR NAUSEA MEDICATION °*UNUSUAL SHORTNESS OF BREATH °*UNUSUAL BRUISING OR BLEEDING °*URINARY PROBLEMS (pain or burning when urinating, or frequent urination) °*BOWEL PROBLEMS (unusual diarrhea, constipation, pain near the anus) °TENDERNESS IN MOUTH AND THROAT WITH OR WITHOUT PRESENCE OF ULCERS (sore throat, sores in mouth, or a toothache) °UNUSUAL RASH, SWELLING OR PAIN  °UNUSUAL VAGINAL DISCHARGE OR ITCHING  ° °Items with * indicate a potential emergency and should be followed up as soon as possible or go to the Emergency Department if any problems should occur. ° °Please show the CHEMOTHERAPY ALERT CARD or IMMUNOTHERAPY ALERT CARD at check-in  to the Emergency Department and triage nurse. ° °Should you have questions after your visit or need to cancel or reschedule your appointment, please contact [facility]  Dept: [phone]  and follow the prompts.  Office hours are 8:00 a.m. to 4:30 p.m. Monday - Friday. Please note that voicemails left after 4:00 p.m. may not be returned until the following business day.  We are closed weekends and major holidays. You have access to a nurse at all times for urgent questions. Please call the main number to the clinic Dept: [phone] and follow the prompts. ° ° °For any non-urgent questions, you may also contact your provider using MyChart. We now offer e-Visits for anyone 18 and older to request care online for non-urgent symptoms. For details visit [email or web address]. °  °Also download the MyChart app! Go to the app store, search "MyChart", open the app, select , and log in with your MyChart username and password. ° °Due to Covid, a mask is required upon entering the hospital/clinic. If you do not have a mask, one will be given to you upon arrival. For doctor visits, patients may have 1 support person aged 18 or older with them. For treatment visits, patients cannot have anyone with them due to current Covid guidelines and our immunocompromised population.  ° °

## 2020-10-09 ENCOUNTER — Telehealth: Payer: Self-pay | Admitting: Hematology

## 2020-10-09 NOTE — Telephone Encounter (Signed)
Left message with follow-up appointment per 7/6 los. 

## 2020-10-13 ENCOUNTER — Other Ambulatory Visit: Payer: Self-pay | Admitting: Internal Medicine

## 2020-10-23 ENCOUNTER — Other Ambulatory Visit: Payer: Self-pay | Admitting: Internal Medicine

## 2020-10-24 ENCOUNTER — Other Ambulatory Visit (HOSPITAL_COMMUNITY): Payer: Self-pay

## 2020-10-27 ENCOUNTER — Other Ambulatory Visit (HOSPITAL_COMMUNITY): Payer: Self-pay

## 2020-10-29 ENCOUNTER — Other Ambulatory Visit (HOSPITAL_COMMUNITY): Payer: Self-pay

## 2020-10-31 ENCOUNTER — Other Ambulatory Visit (HOSPITAL_COMMUNITY): Payer: Self-pay

## 2020-10-31 ENCOUNTER — Inpatient Hospital Stay: Payer: Medicaid Other | Admitting: Hematology

## 2020-10-31 ENCOUNTER — Inpatient Hospital Stay: Payer: Medicaid Other

## 2020-10-31 ENCOUNTER — Other Ambulatory Visit: Payer: Self-pay

## 2020-10-31 VITALS — BP 148/59 | HR 40 | Temp 98.5°F | Resp 18 | Wt 231.0 lb

## 2020-10-31 DIAGNOSIS — D500 Iron deficiency anemia secondary to blood loss (chronic): Secondary | ICD-10-CM | POA: Diagnosis not present

## 2020-10-31 DIAGNOSIS — Z95828 Presence of other vascular implants and grafts: Secondary | ICD-10-CM

## 2020-10-31 DIAGNOSIS — C787 Secondary malignant neoplasm of liver and intrahepatic bile duct: Secondary | ICD-10-CM

## 2020-10-31 DIAGNOSIS — I10 Essential (primary) hypertension: Secondary | ICD-10-CM | POA: Diagnosis not present

## 2020-10-31 DIAGNOSIS — Z5112 Encounter for antineoplastic immunotherapy: Secondary | ICD-10-CM | POA: Diagnosis not present

## 2020-10-31 DIAGNOSIS — C189 Malignant neoplasm of colon, unspecified: Secondary | ICD-10-CM

## 2020-10-31 LAB — CMP (CANCER CENTER ONLY)
ALT: 13 U/L (ref 0–44)
AST: 22 U/L (ref 15–41)
Albumin: 3.6 g/dL (ref 3.5–5.0)
Alkaline Phosphatase: 88 U/L (ref 38–126)
Anion gap: 7 (ref 5–15)
BUN: 24 mg/dL — ABNORMAL HIGH (ref 6–20)
CO2: 20 mmol/L — ABNORMAL LOW (ref 22–32)
Calcium: 9.7 mg/dL (ref 8.9–10.3)
Chloride: 111 mmol/L (ref 98–111)
Creatinine: 1.01 mg/dL — ABNORMAL HIGH (ref 0.44–1.00)
GFR, Estimated: >60 mL/min (ref >60)
Glucose, Bld: 90 mg/dL (ref 70–99)
Potassium: 4.8 mmol/L (ref 3.5–5.1)
Sodium: 138 mmol/L (ref 135–145)
Total Bilirubin: 0.5 mg/dL (ref 0.3–1.2)
Total Protein: 7.7 g/dL (ref 6.5–8.1)

## 2020-10-31 LAB — CBC WITH DIFFERENTIAL (CANCER CENTER ONLY)
Abs Immature Granulocytes: 0.03 10*3/uL (ref 0.00–0.07)
Basophils Absolute: 0 10*3/uL (ref 0.0–0.1)
Basophils Relative: 1 %
Eosinophils Absolute: 0.2 10*3/uL (ref 0.0–0.5)
Eosinophils Relative: 3 %
HCT: 34.2 % — ABNORMAL LOW (ref 36.0–46.0)
Hemoglobin: 11.2 g/dL — ABNORMAL LOW (ref 12.0–15.0)
Immature Granulocytes: 1 %
Lymphocytes Relative: 34 %
Lymphs Abs: 2.2 10*3/uL (ref 0.7–4.0)
MCH: 34.3 pg — ABNORMAL HIGH (ref 26.0–34.0)
MCHC: 32.7 g/dL (ref 30.0–36.0)
MCV: 104.6 fL — ABNORMAL HIGH (ref 80.0–100.0)
Monocytes Absolute: 0.7 10*3/uL (ref 0.1–1.0)
Monocytes Relative: 12 %
Neutro Abs: 3.2 10*3/uL (ref 1.7–7.7)
Neutrophils Relative %: 49 %
Platelet Count: 150 10*3/uL (ref 150–400)
RBC: 3.27 MIL/uL — ABNORMAL LOW (ref 3.87–5.11)
RDW: 17.6 % — ABNORMAL HIGH (ref 11.5–15.5)
WBC Count: 6.4 10*3/uL (ref 4.0–10.5)
nRBC: 0 % (ref 0.0–0.2)

## 2020-10-31 LAB — TOTAL PROTEIN, URINE DIPSTICK: Protein, ur: NEGATIVE mg/dL

## 2020-10-31 LAB — CEA (IN HOUSE-CHCC): CEA (CHCC-In House): 225.94 ng/mL — ABNORMAL HIGH (ref 0.00–5.00)

## 2020-10-31 MED ORDER — SODIUM CHLORIDE 0.9 % IV SOLN
Freq: Once | INTRAVENOUS | Status: AC
  Filled 2020-10-31: qty 250

## 2020-10-31 MED ORDER — CEPHALEXIN 500 MG PO CAPS: 500.0000 mg | ORAL_CAPSULE | Freq: Three times a day (TID) | ORAL | 0 refills | Status: DC

## 2020-10-31 MED ORDER — HEPARIN SOD (PORK) LOCK FLUSH 100 UNIT/ML IV SOLN
500.0000 [IU] | Freq: Once | INTRAVENOUS | Status: AC | PRN
  Administered 2020-10-31: 500 [IU]
  Filled 2020-10-31: qty 5

## 2020-10-31 MED ORDER — SODIUM CHLORIDE 0.9% FLUSH
10.0000 mL | INTRAVENOUS | Status: DC | PRN
  Administered 2020-10-31: 10 mL
  Filled 2020-10-31: qty 10

## 2020-10-31 MED ORDER — CAPECITABINE 500 MG PO TABS
2000.0000 mg | ORAL_TABLET | Freq: Two times a day (BID) | ORAL | 2 refills | Status: DC
  Filled 2020-10-31: qty 112, 14d supply, fill #0
  Filled 2020-10-31: qty 112, 21d supply, fill #0
  Filled 2020-11-14: qty 112, 21d supply, fill #1
  Filled 2020-12-23 – 2021-01-02 (×2): qty 112, 21d supply, fill #2

## 2020-10-31 MED ORDER — SODIUM CHLORIDE 0.9 % IV SOLN
7.5000 mg/kg | Freq: Once | INTRAVENOUS | Status: AC
  Administered 2020-10-31: 800 mg via INTRAVENOUS
  Filled 2020-10-31: qty 32

## 2020-10-31 NOTE — Progress Notes (Signed)
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 Bowel-gas pattern consistent with small bowel obstruction. No free air. 2. No acute chest findings.   01/23/2019 Imaging   CT AP IMPRESSION: Obstructing mid transverse colonic mass with mild regional adenopathy and hepatic metastatic disease. The mass likely extends through the serosa; no ascites or peritoneal nodularity.   01/24/2019 Surgery   Surgeon: Clovis Riley MD Assistant: [name] Procedure performed: Transverse colectomy with end colostomy, liver biopsy Procedure classification: URGENT/EMERGENT Preop diagnosis: Obstructing, metastatic transverse colon mass Post-op diagnosis/intraop findings: Same   01/24/2019 Pathology Results   FINAL MICROSCOPIC DIAGNOSIS:   A. COLON, TRANSVERSE, RESECTION:  Colonic adenocarcinoma, 5 cm.  Carcinoma extends into pericolonic connective tissue and focally to  serosal surface.  Margins not involved.  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   B. LIVER NODULE, LEFT, BIOPSY:  Metastatic adenocarcinoma.    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 Hypermetabolic metastatic disease in the liver and mediastinal/hilar/axillary lymph nodes. 2. Focal hypermetabolism in the rectum. Continued attention on follow-up exams is warranted. 3. Focal hypermetabolism medial to the right adrenal gland may be within a metastatic lymph node, better visualized on 01/23/2019. 4. Aortic atherosclerosis (ICD10-170.0). Coronary artery calcification.   03/12/2019 -  Chemotherapy   She started 5FU q2weeks on 03/12/19 for 2 cycles. She started full dose FOLFOX with Avastin on 04/09/19. Oxaliplatin dose reduced repeatedly due to neuropathy C12 and held since C16 on 10/09/19. Now on maintenance Avastin and 5FU q2weeks since 10/09/19       -Maintenance change to maintenance xeloda 2000 mg BID days 1-14 q21 days and q3 weeks Zirabev (15 mg/kg) starting 01/21/20. First cycle was taken 1049m BID due to misunderstanding.    05/31/2019 Imaging   Restaging CT CAP IMPRESSION: 1. Similar to mild interval decrease in size of multiple hepatic lesions, partially calcified. 2. 2 mm right upper lobe pulmonary nodule. Recommend attention on follow-up. 3. Emphysema and aortic atherosclerosis.   08/23/2019 Imaging   CT CAP w contrast  IMPRESSION: 1. The dominant peripheral right liver metastasis has mildly increased. Other smaller liver metastases are stable. 2. Otherwise no new or progressive metastatic disease in the chest, abdomen or pelvis. 3. Aortic Atherosclerosis (ICD10-I70.0) and Emphysema (ICD10-J43.9).   11/27/2019 Imaging   CT CAP w contrast  IMPRESSION: Status post transverse colectomy with right mid abdominal colostomy.   Mildly progressive hepatic metastases, as above.   No evidence of metastatic  disease in the chest.  Small mediastinal lymph nodes are within normal limits.   Additional stable ancillary findings as above.   02/21/2020 Imaging   IMPRESSION: 1. Stable hepatic metastatic disease. 2. Aortic atherosclerosis (ICD10-I70.0). Coronary artery calcification. 3.  Emphysema (ICD10-J43.9).   05/19/2020 Imaging   CT CAP  IMPRESSION: 1. Multiple partially calcified liver metastases are again noted. With the exception of a small lesion in segment 7/8 lesions are not significantly changed in the interval. No new liver lesions identified. 2. Coronary artery atherosclerotic calcifications. 3. Aortic atherosclerosis. 4. 2 mm right upper lobe lung nodule identified.  Unchanged.   Aortic Atherosclerosis (ICD10-I70.0).      CURRENT THERAPY:  Maintenance Avastin and 5FU q2weeks since 10/09/19, change to maintenance Xeloda 2000 mg BID days 1-14 q21 days and q3 weeks avastin (15 mg/kg) starting 01/21/20. First cycle was taken 1062m BID due to misunderstanding. Full treatment held after 04/15/20 due to [place] Restarted Xeloda on 05/13/20.   INTERVAL HISTORY:  MRiley Papinis here for a follow up. She was last seen by me 09/19/20 and by NP Lacie in the interim. She presents to the clinic alone.  She reports issues with her left big toenail. There is discharge present on her sock.  All other systems were reviewed with the patient and are negative.  MEDICAL HISTORY:  Past Medical History:  Diagnosis Date   Anemia    low iron   Cancer (HCC)    colon cancer   [place] 01[place]   Hypertension 01/23/2019   Personal history of chemotherapy 01/2019   colon CA   SBO (small bowel obstruction) ([place] 01/23/2019    SURGICAL HISTORY: Past Surgical History:  Procedure Laterality Date   CESAREAN SECTION     x2   COLOSTOMY N/A 01/24/2019   Procedure: End Loop Colostomy;  Surgeon: CClovis Riley MD;  [facility];  Service: General;  Laterality: N/A;   PARTIAL COLECTOMY N/A 01/24/2019    Procedure: PARTIAL COLECTOMY;  Surgeon: CClovis Riley MD;  [facility];  Service: General;  Laterality: N/A;   PORTACATH PLACEMENT Right 02/28/2019   Procedure: INSERTION PORT-A-CATH WITH ULTRASOUND GUIDANCE;  Surgeon: CClovis Riley MD;  [facility]  Service: General;  Laterality: Right;    I have reviewed the social history and family history with the patient and they are unchanged from previous note.  ALLERGIES:  has No Known Allergies.  MEDICATIONS:  Current Outpatient Medications  Medication Sig Dispense Refill   acetaminophen (TYLENOL) 325 MG tablet Take 2 tablets (650 mg total) by mouth every 6 (six) hours as needed.     amLODipine (NORVASC) 10 MG tablet TAKE 1 TABLET BY MOUTH EVERY DAY 90 tablet 1   benzonatate (TESSALON) 100 MG capsule Take 1 capsule (100 mg total) by mouth every 8 (eight) hours. 21 capsule 0   capecitabine (XELODA) 500 MG tablet Take 4 tablets (2,000 mg total) by mouth 2 (two) times daily after a meal. For 14 days, then off for 7 days. 112 tablet 0   ferrous sulfate 325 (65 FE) MG tablet TAKE 1 TABLET (325 MG TOTAL) BY MOUTH 2 (TWO) TIMES DAILY WITH A MEAL. 180 tablet 1   gabapentin (NEURONTIN) 300 MG capsule Take 1 capsule (300 mg total) by mouth 2 (two) times daily. 60 capsule 3   hydrALAZINE (APRESOLINE) 25 MG tablet TAKE 1 TABLET BY MOUTH EVERY 8 HOURS 90 tablet 1   hydrochlorothiazide (HYDRODIURIL) 12.5 MG tablet Take 1  tablet (12.5 mg total) by mouth daily. 90 tablet 1   hydrocortisone (ANUSOL-HC) 2.5 % rectal cream Place 1 application rectally 2 (two) times daily. 30 g 0   Ibuprofen 200 MG CAPS Take 400 mg by mouth daily as needed (pain).     isosorbide mononitrate (IMDUR) 30 MG 24 hr tablet TAKE 1 TABLET BY MOUTH EVERY DAY 90 tablet 0   lidocaine (LMX) 4 % cream Apply 1 application topically 3 (three) times daily as needed. 30 g 0   lisinopril (ZESTRIL) 40 MG tablet TAKE 1 TABLET BY MOUTH EVERY DAY 90 tablet 1   loperamide (IMODIUM) 2 MG  capsule TAKE 1 CAPSULE (2 MG TOTAL) BY MOUTH AS NEEDED FOR DIARRHEA OR LOOSE STOOLS. 90 capsule 0   metoprolol tartrate (LOPRESSOR) 50 MG tablet TAKE 1 TABLET BY MOUTH TWICE A DAY 180 tablet 1   ondansetron (ZOFRAN ODT) 4 MG disintegrating tablet 38m ODT q4 hours prn nausea/vomit 20 tablet 0   ondansetron (ZOFRAN) 4 MG tablet Take 1 tablet (4 mg total) by mouth every 6 (six) hours as needed for nausea. 30 tablet 0   polycarbophil (FIBERCON) 625 MG tablet Take 1 tablet (625 mg total) by mouth daily. 30 tablet 0   No current facility-administered medications for this visit.   Facility-Administered Medications Ordered in Other Visits  Medication Dose Route Frequency Provider Last Rate Last Admin   sodium chloride flush (NS) 0.9 % injection 10 mL  10 mL Intracatheter PRN FTruitt Merle MD   10 mL at 06/06/20 1051    PHYSICAL EXAMINATION: ECOG PERFORMANCE STATUS: 0 - Asymptomatic  Vitals:   10/31/20 1048  BP: (!) 148/59  Pulse: (!) 40  Resp: 18  Temp: 98.5 F (36.9 C)  SpO2: 100%   Filed Weights   10/31/20 1048  Weight: 231 lb (104.8 kg)    GENERAL:alert, no distress and comfortable SKIN: skin color normal, no rashes or significant lesions EYES: normal, Conjunctiva are pink and non-injected, sclera clear  NEURO: alert & oriented x 3 with fluent speech   LABORATORY DATA:  I have reviewed the data as listed CBC Latest Ref Rng & Units 10/31/2020 10/08/2020 09/25/2020  WBC 4.0 - 10.5 K/uL 6.4 6.6 5.7  Hemoglobin 12.0 - 15.0 g/dL 11.2(L) 10.9(L) 11.4(L)  Hematocrit 36.0 - 46.0 % 34.2(L) 33.7(L) 35.1(L)  Platelets 150 - 400 K/uL 150 169 172     CMP Latest Ref Rng & Units 10/08/2020 09/25/2020 09/19/2020  Glucose 70 - 99 mg/dL 112(H) 92 92  BUN 6 - 20 mg/dL 35(H) 25(H) 49(H)  Creatinine 0.44 - 1.00 mg/dL 1.44(H) 1.01(H) 1.65(H)  Sodium 135 - 145 mmol/L 139 137 137  Potassium 3.5 - 5.1 mmol/L 4.6 4.8 4.9  Chloride 98 - 111 mmol/L 114(H) 111 112(H)  CO2 22 - 32 mmol/L 18(L) 20(L) 20(L)   Calcium 8.9 - 10.3 mg/dL 9.5 9.7 9.3  Total Protein 6.5 - 8.1 g/dL 7.5 7.9 7.2  Total Bilirubin 0.3 - 1.2 mg/dL 0.3 0.4 0.2(L)  Alkaline Phos 38 - 126 U/L 95 79 84  AST 15 - 41 U/L 16 17 21   ALT 0 - 44 U/L 8 10 15       RADIOGRAPHIC STUDIES: I have personally reviewed the radiological images as listed and agreed with the findings in the report. No results found.   ASSESSMENT & PLAN:  Casey Puchalskiis a 60y.o. female with   1. 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 -PET from 02/26/19 shows known liver metastasis and metastatic lymphadenopathy in chest and right axilla. -[name] shows MSI stable disease, Kras+ No targetable mutation, she is not a candidate for EGFR or immunotherapy. -Began palliative first line chemo on 03/12/2019, received 5FU/leuc with first 2 cycles for large open abdominal wound after surgery. She started full dose FOLFOX and avastin with cycle 2. Due to neuropathy Oxaliplatin was stopped after 09/24/19, she has been on maintenance therapy. -For convenience she was switched to oral chemo Xeloda 2 weeks on/1 week off and 0 Bev every 3 weeks in 01/21/2020.  Treatment held 05/01/2020 - 05/12/2020 due to COVID infection -CEA fluctuates, last imaging 08/25/2020 showed stable to slight decrease in liver metastases, stable right upper lobe lung nodule and low right paratracheal lymph node is mildly increased.  No new or progressive disease.  She continues maintenance therapy -Labs reviewed, overall adequate to proceed with Zirabec today and continue Xeloda at same dose. She started current cycle today  -F/u in 3 weeks.  -plan to repeat scan in August   2. Toenail infection -she has a few loose toenail, left worse than right, left with discharge  and tenderness  -I will prescribe antibiotics for her today.  3. Brachycardia -Her pulse is 40 today (10/31/20) -I recommend she decrease her metoprolol to only once a day. -I also recommended she buy a monitor to keep at home.   4. Chemotherapy induced peripheral neuropathy (CIPN), G1 -secondary to Oxaliplatin, developed after cycle 12 FOLFOX. Oxaliplatin d/c after C15 on 09/24/19 -Neuropathy in fingers and feet are moderate and stable with mild decreased function in fingers.  -She is currently on gabapentin 100 mg AM and 300 mg qHS and titrate up if needed.  -overall improved, no impact on her function now  -Will continue to monitor.    5. Nutrition/Anorexia -she reportedly weighed 253 lbs a few years ago, and weighed 203 lbs at symptom onset ~11/2018 -She plans to get complete dentures soon.  -She is stable around 230 lbs.   6. 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 Will give IV iron if needed  -Mild and stable anemia lately.    7. HTN, uncontrolled  -on hydralazine, isosorbide, lisinopril, metoprolol, amlodipine and HCTZ  -Will monitor on bevacizumab.  -BP is 148/59 today (10/31/20)   8. Goals of care discussion, Social support -The patient understands the goal of care is palliative. She is full code now  -She worked in [employer] at 2 different jobs, but has been out of work lately due to her symptoms -She is not getting disability, no pay, but her employer kept her on their insurance plan -She may be eligible for Medicaid   9. COVID (+) 05/01/20 -She tested positive for COVID on 05/01/20. She received treatment infusion on 05/06/20. Symptoms overall resolved after treatment.  -Per patient she received 2 COVID vaccines. Will proceed with COVID booster     PLAN: -Labs reviewed and adequate to proceed with [place] today   -Continue current cycle Xeloda 2029m BID 2 weeks on/1 week,start  this cycle today  -I called in Keflex for her left toenail infection -Lab, flush, F/u and Zirabev in 3 and 6 weeks.   No problem-specific Assessment & Plan notes found for this encounter.  No orders of the defined types were placed in this encounter.  All questions were answered. The patient knows to call the clinic with any problems, questions or concerns.      Truitt Merle, MD 10/31/2020    I, Wilburn Mylar, am acting as scribe for Truitt Merle, MD.   I have reviewed the above documentation for accuracy and completeness, and I agree with the above.

## 2020-10-31 NOTE — Patient Instructions (Signed)
[facility] MEDICAL ONCOLOGY  Discharge Instructions: °Thank you for choosing [facility] to provide your oncology and hematology care.  ° °If you have a lab appointment with the Cancer Center, please go directly to the Cancer Center and check in at the registration area. °  °Wear comfortable clothing and clothing appropriate for easy access to any Portacath or PICC line.  ° °We strive to give you quality time with your provider. You may need to reschedule your appointment if you arrive late (15 or more minutes).  Arriving late affects you and other patients whose appointments are after yours.  Also, if you miss three or more appointments without notifying the office, you may be dismissed from the clinic at the provider’s discretion.    °  °For prescription refill requests, have your pharmacy contact our office and allow 72 hours for refills to be completed.   ° °Today you received the following chemotherapy and/or immunotherapy agents: Zirabev    °  °To help prevent nausea and vomiting after your treatment, we encourage you to take your nausea medication as directed. ° °BELOW ARE SYMPTOMS THAT SHOULD BE REPORTED IMMEDIATELY: °*FEVER GREATER THAN 100.4 F (38 °C) OR HIGHER °*CHILLS OR SWEATING °*NAUSEA AND VOMITING THAT IS NOT CONTROLLED WITH YOUR NAUSEA MEDICATION °*UNUSUAL SHORTNESS OF BREATH °*UNUSUAL BRUISING OR BLEEDING °*URINARY PROBLEMS (pain or burning when urinating, or frequent urination) °*BOWEL PROBLEMS (unusual diarrhea, constipation, pain near the anus) °TENDERNESS IN MOUTH AND THROAT WITH OR WITHOUT PRESENCE OF ULCERS (sore throat, sores in mouth, or a toothache) °UNUSUAL RASH, SWELLING OR PAIN  °UNUSUAL VAGINAL DISCHARGE OR ITCHING  ° °Items with * indicate a potential emergency and should be followed up as soon as possible or go to the Emergency Department if any problems should occur. ° °Please show the CHEMOTHERAPY ALERT CARD or IMMUNOTHERAPY ALERT CARD at check-in to  the Emergency Department and triage nurse. ° °Should you have questions after your visit or need to cancel or reschedule your appointment, please contact [facility]  Dept: [phone]  and follow the prompts.  Office hours are 8:00 a.m. to 4:30 p.m. Monday - Friday. Please note that voicemails left after 4:00 p.m. may not be returned until the following business day.  We are closed weekends and major holidays. You have access to a nurse at all times for urgent questions. Please call the main number to the clinic Dept: [phone] and follow the prompts. ° ° °For any non-urgent questions, you may also contact your provider using MyChart. We now offer e-Visits for anyone 18 and older to request care online for non-urgent symptoms. For details visit [email or web address]. °  °Also download the MyChart app! Go to the app store, search "MyChart", open the app, select [facility], and log in with your MyChart username and password. ° °Due to Covid, a mask is required upon entering the hospital/clinic. If you do not have a mask, one will be given to you upon arrival. For doctor visits, patients may have 1 support person aged 18 or older with them. For treatment visits, patients cannot have anyone with them due to current Covid guidelines and our immunocompromised population.  ° °

## 2020-11-02 ENCOUNTER — Encounter: Payer: Self-pay | Admitting: Hematology

## 2020-11-03 ENCOUNTER — Telehealth: Payer: Self-pay | Admitting: Hematology

## 2020-11-03 NOTE — Telephone Encounter (Signed)
Scheduled follow-up appointment per 7/29 los. Patient is aware. 

## 2020-11-03 NOTE — Telephone Encounter (Signed)
error 

## 2020-11-05 ENCOUNTER — Other Ambulatory Visit: Payer: Self-pay | Admitting: Internal Medicine

## 2020-11-05 NOTE — Telephone Encounter (Signed)
Refill sent.

## 2020-11-11 DIAGNOSIS — Z933 Colostomy status: Secondary | ICD-10-CM | POA: Diagnosis not present

## 2020-11-11 DIAGNOSIS — K56609 Unspecified intestinal obstruction, unspecified as to partial versus complete obstruction: Secondary | ICD-10-CM | POA: Diagnosis not present

## 2020-11-11 DIAGNOSIS — S31109A Unspecified open wound of abdominal wall, unspecified quadrant without penetration into peritoneal cavity, initial encounter: Secondary | ICD-10-CM | POA: Diagnosis not present

## 2020-11-11 DIAGNOSIS — T8131XD Disruption of external operation (surgical) wound, not elsewhere classified, subsequent encounter: Secondary | ICD-10-CM | POA: Diagnosis not present

## 2020-11-12 ENCOUNTER — Encounter: Payer: Self-pay | Admitting: Hematology

## 2020-11-14 ENCOUNTER — Other Ambulatory Visit (HOSPITAL_COMMUNITY): Payer: Self-pay

## 2020-11-14 ENCOUNTER — Telehealth: Payer: Self-pay | Admitting: Internal Medicine

## 2020-11-14 MED ORDER — LISINOPRIL 40 MG PO TABS: 40.0000 mg | ORAL_TABLET | Freq: Every day | ORAL | 1 refills | Status: DC

## 2020-11-14 NOTE — Telephone Encounter (Signed)
Pt call and stated she need a refilllisinopril (ZESTRIL) 40 MG tablet [facility]  [id number] [place] [address]:  [phone] Fax:  [phone]

## 2020-11-17 ENCOUNTER — Other Ambulatory Visit (HOSPITAL_COMMUNITY): Payer: Self-pay

## 2020-11-17 ENCOUNTER — Ambulatory Visit (HOSPITAL_COMMUNITY)
Admission: RE | Admit: 2020-11-17 | Discharge: 2020-11-17 | Disposition: A | Discharge status: Home / Self Care | Payer: Medicaid Other | Source: Ambulatory Visit | Attending: Nurse Practitioner | Admitting: Nurse Practitioner

## 2020-11-17 ENCOUNTER — Encounter (HOSPITAL_COMMUNITY): Payer: Self-pay

## 2020-11-17 ENCOUNTER — Other Ambulatory Visit: Payer: Self-pay

## 2020-11-17 DIAGNOSIS — I700 Atherosclerosis of aorta: Secondary | ICD-10-CM | POA: Diagnosis not present

## 2020-11-17 DIAGNOSIS — K7689 Other specified diseases of liver: Secondary | ICD-10-CM | POA: Diagnosis not present

## 2020-11-17 DIAGNOSIS — C189 Malignant neoplasm of colon, unspecified: Secondary | ICD-10-CM | POA: Diagnosis not present

## 2020-11-17 DIAGNOSIS — I2510 Atherosclerotic heart disease of native coronary artery without angina pectoris: Secondary | ICD-10-CM | POA: Diagnosis not present

## 2020-11-17 DIAGNOSIS — C787 Secondary malignant neoplasm of liver and intrahepatic bile duct: Secondary | ICD-10-CM | POA: Insufficient documentation

## 2020-11-17 DIAGNOSIS — R911 Solitary pulmonary nodule: Secondary | ICD-10-CM | POA: Diagnosis not present

## 2020-11-17 MED ORDER — IOHEXOL 350 MG/ML SOLN
80.0000 mL | Freq: Once | INTRAVENOUS | Status: AC | PRN
  Administered 2020-11-17: 80 mL via INTRAVENOUS

## 2020-11-17 MED ORDER — HEPARIN SOD (PORK) LOCK FLUSH 100 UNIT/ML IV SOLN: 500.0000 [IU] | Freq: Once | INTRAVENOUS | Status: DC

## 2020-11-17 MED ORDER — HEPARIN SOD (PORK) LOCK FLUSH 100 UNIT/ML IV SOLN
INTRAVENOUS | Status: AC
  Filled 2020-11-17: qty 5

## 2020-11-18 NOTE — Progress Notes (Signed)
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 Bowel-gas pattern consistent with small bowel obstruction. No free air. 2. No acute chest findings.   01/23/2019 Imaging   CT AP IMPRESSION: Obstructing mid transverse colonic mass with mild regional adenopathy and hepatic metastatic disease. The mass likely extends through the serosa; no ascites or peritoneal nodularity.   01/24/2019 Surgery   Surgeon: Casey Riley MD Assistant: [name] Procedure performed: Transverse colectomy with end colostomy, liver biopsy Procedure classification: URGENT/EMERGENT Preop diagnosis: Obstructing, metastatic transverse colon mass Post-op diagnosis/intraop findings: Same   01/24/2019 Pathology Results   FINAL MICROSCOPIC DIAGNOSIS:   A. COLON, TRANSVERSE, RESECTION:  Colonic adenocarcinoma, 5 cm.  Carcinoma extends into pericolonic connective tissue and focally to  serosal surface.  Margins not involved.  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   B. LIVER NODULE, LEFT, BIOPSY:  Metastatic adenocarcinoma.    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 Hypermetabolic metastatic disease in the liver and mediastinal/hilar/axillary lymph nodes. 2. Focal hypermetabolism in the rectum. Continued attention on follow-up exams is warranted. 3. Focal hypermetabolism medial to the right adrenal gland may be within a metastatic lymph node, better visualized on 01/23/2019. 4. Aortic atherosclerosis (ICD10-170.0). Coronary artery calcification.   03/12/2019 -  Chemotherapy   She started 5FU q2weeks on 03/12/19 for 2 cycles. She started full dose FOLFOX with Avastin on 04/09/19. Oxaliplatin dose reduced repeatedly due to neuropathy C12 and held since C16 on 10/09/19. Now on maintenance Avastin and 5FU q2weeks since 10/09/19       -Maintenance change to maintenance xeloda 2000 mg BID days 1-14 q21 days and q3 weeks Zirabev (15 mg/kg) starting 01/21/20. First cycle was taken 101m BID due to misunderstanding.    05/31/2019 Imaging   Restaging CT CAP IMPRESSION: 1. Similar to mild interval decrease in size of multiple hepatic lesions, partially calcified. 2. 2 mm right upper lobe pulmonary nodule. Recommend attention on follow-up. 3. Emphysema and aortic atherosclerosis.   08/23/2019 Imaging   CT CAP w contrast  IMPRESSION: 1. The dominant peripheral right liver metastasis has mildly increased. Other smaller liver metastases are stable. 2. Otherwise no new or progressive metastatic disease in the chest, abdomen or pelvis. 3. Aortic Atherosclerosis (ICD10-I70.0) and Emphysema (ICD10-J43.9).   11/27/2019 Imaging   CT CAP w contrast  IMPRESSION: Status post transverse colectomy with right mid abdominal colostomy.   Mildly progressive hepatic metastases, as above.   No evidence of metastatic  disease in the chest.  Small mediastinal lymph nodes are within normal limits.   Additional stable ancillary findings as above.   02/21/2020 Imaging   IMPRESSION: 1. Stable hepatic metastatic disease. 2. Aortic atherosclerosis (ICD10-I70.0). Coronary artery calcification. 3.  Emphysema (ICD10-J43.9).   05/19/2020 Imaging   CT CAP  IMPRESSION: 1. Multiple partially calcified liver metastases are again noted. With the exception of a small lesion in segment 7/8 lesions are not significantly changed in the interval. No new liver lesions identified. 2. Coronary artery atherosclerotic calcifications. 3. Aortic atherosclerosis. 4. 2 mm right upper lobe lung nodule identified.  Unchanged.   Aortic Atherosclerosis (ICD10-I70.0).   11/17/2020 Imaging   CT CAP  IMPRESSION: 1. Partially calcified lesions throughout the liver are stable accounting for differences in technique, contrasted imaging on today's study is compared to noncontrast imaging on the prior. 2. No new hepatic lesions. 3. Tiny 3 mm pulmonary nodule in the RIGHT upper lobe unchanged since the prior study. Attention on follow-up. 4. Mild fullness of RIGHT paratracheal nodal tissue is minimally increased and borderline enlarged, attention on follow-up. 5. RIGHT lower quadrant colostomy. 6. 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 7. Aortic atherosclerosis.      CURRENT THERAPY:  Maintenance Avastin and 5FU q2weeks since 10/09/19, change to maintenance Xeloda 2000 mg BID days 1-14 q21 days and q3 weeks avastin (15 mg/kg) starting 01/21/20. First cycle was taken 1028m BID due to misunderstanding. Full treatment held after 04/15/20 due to [address] Restarted Xeloda on 05/13/20.   INTERVAL HISTORY:  [name] here for a follow up. She was last seen by me on 10/31/2020.  She is clinically doing well, and tolerating treatment  well.  She has mild to moderate fatigue, but able to function well at home.  She has not gone back to work, also she would like to return as part-time.  [place] [address]  She lives with her daughter and she is Arent with her daughter.  She still has bogginess and a dry skin on her palms, mild numbness and tingling from neuropathy, for which she takes gabapentin.  No impact on her [employer] function.  No balance issue.  Her appetite is good, no other complaints.  All other systems were reviewed with the patient and are negative.  MEDICAL HISTORY:  Past Medical History:  Diagnosis Date   Anemia    low iron   Cancer (HCC)    colon cancer   [place]   Hypertension 01/23/2019   Personal history of chemotherapy 01/2019   colon CA   [place] obstruction) [place] 01/23/2019    SURGICAL HISTORY: Past Surgical History:  Procedure Laterality Date   CESAREAN SECTION     x2   COLOSTOMY N/A 01/24/2019   Procedure: End Loop Colostomy;  Surgeon: CClovis Riley MD;  [facility];  Service: General;  Laterality: N/A;   PARTIAL COLECTOMY N/A 01/24/2019   Procedure: PARTIAL COLECTOMY;  Surgeon: CClovis Riley MD;  [facility];  Service: General;  Laterality: N/A;   PORTACATH PLACEMENT Right 02/28/2019   Procedure: INSERTION PORT-A-CATH WITH ULTRASOUND GUIDANCE;  Surgeon: CClovis Riley MD;  [place] [facility]  Service: General;  Laterality: Right;    I have reviewed the social history and family history with the patient and they are unchanged from previous note.  ALLERGIES:  has No Known Allergies.  MEDICATIONS:  Current Outpatient Medications  Medication Sig Dispense Refill   acetaminophen (TYLENOL) 325 MG tablet Take 2 tablets (650 mg total) by mouth every 6 (six) hours as needed.     amLODipine (NORVASC) 10 MG tablet TAKE 1 TABLET BY MOUTH EVERY DAY 90 tablet 1   benzonatate (TESSALON) 100 MG capsule Take 1 capsule (100 mg total) by  mouth every 8 (eight) hours. 21 capsule 0   capecitabine (XELODA) 500 MG tablet Take 4 tablets (2,000 mg total) by mouth 2 (two) times daily after a meal. For 14 days, then off for 7 days. 112 tablet 2   ferrous sulfate 325 (65 FE) MG tablet TAKE 1 TABLET (325 MG TOTAL) BY MOUTH 2 (TWO) TIMES DAILY WITH A MEAL. 180 tablet 1   gabapentin (NEURONTIN) 300 MG capsule Take 1 capsule (300 mg total) by mouth 2 (two) times daily. 60 capsule 3   hydrALAZINE (APRESOLINE) 25 MG tablet TAKE 1 TABLET BY MOUTH EVERY 8 HOURS 90 tablet 1   hydrochlorothiazide (HYDRODIURIL) 12.5 MG tablet Take 1 tablet (12.5 mg total) by mouth daily. 90 tablet 1   hydrocortisone (ANUSOL-HC) 2.5 % rectal cream Place 1 application rectally 2 (two) times daily. 30 g 0   Ibuprofen 200 MG CAPS Take 400 mg by mouth daily as needed (pain).     isosorbide mononitrate (IMDUR) 30 MG 24 hr tablet TAKE 1 TABLET BY MOUTH EVERY DAY 90 tablet 0   lidocaine (LMX) 4 % cream Apply 1 application topically 3 (three) times daily as needed. 30 g 0   lisinopril (ZESTRIL) 40 MG tablet Take 1 tablet (40 mg total) by mouth daily. 90 tablet 1   loperamide (IMODIUM) 2 MG capsule TAKE 1 CAPSULE (2 MG TOTAL) BY MOUTH AS NEEDED FOR DIARRHEA OR LOOSE STOOLS. 90 capsule 0   metoprolol tartrate (LOPRESSOR) 50 MG tablet TAKE 1 TABLET BY MOUTH TWICE A DAY 180 tablet 0   ondansetron (ZOFRAN ODT) 4 MG disintegrating tablet 81m ODT q4 hours prn nausea/vomit 20 tablet 0   ondansetron (ZOFRAN) 4 MG tablet Take 1 tablet (4 mg total) by mouth every 6 (six) hours as needed for nausea. 30 tablet 0   polycarbophil (FIBERCON) 625 MG tablet Take 1 tablet (625 mg total) by mouth daily. 30 tablet 0   No current facility-administered medications for this visit.   Facility-Administered Medications Ordered in Other Visits  Medication Dose Route Frequency Provider Last Rate Last Admin   sodium chloride flush (NS) 0.9 % injection 10 mL  10 mL Intracatheter PRN FTruitt Merle MD   10 mL  at 06/06/20 1051    PHYSICAL EXAMINATION: ECOG PERFORMANCE STATUS: 0 - Asymptomatic  Vitals:   11/19/20 1024  BP: (!) 148/57  Pulse: (!) 44  Resp: 17  Temp: 98.3 F (36.8 C)  SpO2: 99%    Filed Weights   11/19/20 1024  Weight: 231 lb 1.6 oz (104.8 kg)     GENERAL:alert, no distress and comfortable SKIN: skin color normal, no rashes or significant lesions EYES: normal, Conjunctiva are pink and non-injected, sclera clear  NEURO: alert & oriented x 3 with fluent speech   LABORATORY DATA:  I have reviewed the data as listed CBC Latest Ref Rng & Units 11/19/2020 10/31/2020 10/08/2020  WBC 4.0 - 10.5 K/uL 6.6 6.4 6.6  Hemoglobin 12.0 - 15.0 g/dL 10.9(L) 11.2(L) 10.9(L)  Hematocrit 36.0 - 46.0 % 33.1(L) 34.2(L) 33.7(L)  Platelets 150 - 400 K/uL 164 150  169     CMP Latest Ref Rng & Units 10/31/2020 10/08/2020 09/25/2020  Glucose 70 - 99 mg/dL 90 112(H) 92  BUN 6 - 20 mg/dL 24(H) 35(H) 25(H)  Creatinine 0.44 - 1.00 mg/dL 1.01(H) 1.44(H) 1.01(H)  Sodium 135 - 145 mmol/L 138 139 137  Potassium 3.5 - 5.1 mmol/L 4.8 4.6 4.8  Chloride 98 - 111 mmol/L 111 114(H) 111  CO2 22 - 32 mmol/L 20(L) 18(L) 20(L)  Calcium 8.9 - 10.3 mg/dL 9.7 9.5 9.7  Total Protein 6.5 - 8.1 g/dL 7.7 7.5 7.9  Total Bilirubin 0.3 - 1.2 mg/dL 0.5 0.3 0.4  Alkaline Phos 38 - 126 U/L 88 95 79  AST 15 - 41 U/L 22 16 17   ALT 0 - 44 U/L 13 8 10       RADIOGRAPHIC STUDIES: I have personally reviewed the radiological images as listed and agreed with the findings in the report. CT CHEST ABDOMEN PELVIS W CONTRAST  Result Date: 11/18/2020 CLINICAL DATA:  Gastrointestinal cancer, assess treatment response in the setting of metastatic colon cancer. EXAM: CT CHEST, ABDOMEN, AND PELVIS WITH CONTRAST TECHNIQUE: Multidetector CT imaging of the chest, abdomen and pelvis was performed following the standard protocol during bolus administration of intravenous contrast. CONTRAST:  40m OMNIPAQUE IOHEXOL 350 MG/ML SOLN COMPARISON:   Examination from Aug 25, 2020. FINDINGS: CT CHEST FINDINGS Cardiovascular: RIGHT-sided Port-A-Cath terminates at the caval to atrial junction. Heart size top normal with three-vessel coronary artery disease. No substantial pericardial effusion. Central pulmonary vasculature unremarkable on venous phase. Mediastinum/Nodes: No thoracic inlet adenopathy. No axillary adenopathy. No mediastinal lymphadenopathy with top-normal size of a RIGHT paratracheal lymph node 12 mm previously 11 mm. No hilar lymphadenopathy. Mild fullness of hilar nodal tissue in the 5-6 mm range in the LEFT infrahilar region. Lungs/Pleura: No effusion. No consolidation. Airways are patent. Tiny pulmonary nodule (image 64/4) unchanged since the prior study measuring 3 mm. This is in the RIGHT upper lobe Musculoskeletal: No acute or destructive bone process related to the bony thorax. Spinal degenerative changes. See below for additional details regarding musculoskeletal findings. CT ABDOMEN PELVIS FINDINGS Hepatobiliary: Partially calcified lesions throughout the liver grossly similar to the prior exam, contrasted imaging may improve visualization of lesion boundaries. (Image 47/2) 33 mm RIGHT hepatic lobe lesion previously 33 mm. Centered at the border of hepatic subsegment VII/VIII. Adjacent lesion measuring approximately 18 mm (image 49/2), when measured by this observer on the prior study accounting for differences in technique within 1 mm of previous size. This is the lesion posterior to the dominant lesion in the RIGHT hepatic lobe. Lesion towards the dome of the RIGHT hemi liver (image 44/2) 1.3 cm, stable compared to the previous study. Lesion in hepatic subsegment IV (image 54/2) 15 mm, previously approximately 15 mm when measured in a similar fashion and accounting for the lack of contrast on the prior exam. Adjacent smaller lesion in hepatic subsegment IV and 1 near the dome of the medial segment of the LEFT hepatic lobe appear stable  as does a partially calcified lesion in hepatic subsegment VI. No new hepatic lesions with smaller lesions along the medial margin of the RIGHT hemi liver inferiorly as on the prior study as well. Portal vein is patent. The biliary tree is mildly dilated but is unchanged from previous imaging dating back to February of 2022. Pancreas: Normal, without mass, inflammation or ductal dilatation. Spleen: Normal Adrenals/Urinary Tract: Adrenal glands are normal. Symmetric renal enhancement. No hydronephrosis. Smooth contour the urinary bladder. No  Peri vesicle stranding. No suspicious renal lesions. Stomach/Bowel: No acute gastrointestinal process. RIGHT lower quadrant colostomy. Appendix is normal. 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 No aneurysmal dilation of the abdominal aorta. There is no gastrohepatic or hepatoduodenal ligament lymphadenopathy. No retroperitoneal or mesenteric lymphadenopathy. No pelvic sidewall lymphadenopathy. Reproductive: No adnexal mass, unremarkable appearance of the uterus by CT. Other: Postoperative changes in the midline. Small bowel loops closely applied to the under surface of postoperative changes again without acute process such as obstruction Musculoskeletal: No acute musculoskeletal findings. Spinal degenerative changes IMPRESSION: 1. Partially calcified lesions throughout the liver are stable accounting for differences in technique, contrasted imaging on today's study is compared to noncontrast imaging on the prior. 2. No new hepatic lesions. 3. Tiny 3 mm pulmonary nodule in the RIGHT upper lobe unchanged since the prior study. Attention on follow-up. 4. Mild fullness of RIGHT paratracheal nodal tissue is minimally increased and borderline enlarged, attention on follow-up. 5. RIGHT lower quadrant colostomy. 6. 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 7. Aortic atherosclerosis. Aortic Atherosclerosis (ICD10-I70.0). Electronically Signed   By: Zetta Bills M.D.   On: 11/18/2020 09:32     ASSESSMENT & PLAN:  Casey Black is a 60 y.o. female with   1. 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 -PET from 02/26/19 shows known liver metastasis and metastatic lymphadenopathy in chest and right axilla. -[name] shows MSI stable disease, Kras+ No targetable mutation, she is not a candidate for EGFR or immunotherapy. -Began palliative first line chemo on 03/12/2019, received 5FU/leuc with first 2 cycles for large open abdominal wound after surgery. She started full dose FOLFOX and avastin with cycle 2. Due to neuropathy Oxaliplatin was stopped after 09/24/19, she has been on maintenance therapy. -For convenience she was switched to oral chemo Xeloda 2 weeks on/1 week off and 0 Bev every 3 weeks in 01/21/2020.  Treatment held 05/01/2020 - 05/12/2020 due to COVID infection -restaging CT from yesterday showed stable liver mets, no new lesions or evidence of progression, I reviewed with pt   -Continue Xeloda and bevacizumab maintenance therapy.   2. Chemotherapy induced peripheral neuropathy (CIPN), G1 -secondary to Oxaliplatin, developed after cycle 12 FOLFOX. Oxaliplatin d/c after C15 on 09/24/19 -Neuropathy in fingers and feet are moderate and stable with mild decreased function in fingers.  -She is currently on gabapentin 100 mg AM and 300 mg qHS and titrate up if needed.  -overall improved, no impact on her function now  -Will continue to monitor.    3. 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 Will give IV iron if needed  -Mild and stable anemia lately.    4. HTN, uncontrolled  -on hydralazine, isosorbide, lisinopril, metoprolol, amlodipine and HCTZ  -Will monitor on bevacizumab.     5. Goals of care discussion, Social support -The patient understands the goal of care is palliative. She is full code now  -She worked in [employer] at 2 different jobs, but has been out of work  lately due to her symptoms -She is not getting disability, no pay, but her employer kept her on their insurance plan -She may be eligible for Medicaid, I will send a message to our social worker. -She has been getting medical bills (a few thousands), she plan to set up pay ment plan    9. COVID (+) 05/01/20 -She tested positive for COVID on 05/01/20. She received treatment infusion on 05/06/20. Symptoms overall resolved after treatment.  -Per patient she received 2 COVID vaccines. Will proceed with COVID booster     PLAN: -Scan reviewed, stable disease -Lab reviewed, adequate for treatment, will proceed bevacizumab and capecitabine -Follow-up in 3 weeks before next cycle -We will send a message to our social worker to see if she is eligible for disability and Medicaid   No problem-specific Assessment & Plan notes found for this encounter.   No orders of the defined types were placed in this encounter.  All questions were answered. The patient knows to call the clinic with any problems, questions or concerns.  I spent a total of 30 minutes for her visit today.     Casey Merle, MD 11/19/2020

## 2020-11-19 ENCOUNTER — Encounter: Payer: Self-pay | Admitting: General Practice

## 2020-11-19 ENCOUNTER — Encounter: Payer: Self-pay | Admitting: Hematology

## 2020-11-19 ENCOUNTER — Inpatient Hospital Stay: Payer: Medicaid Other

## 2020-11-19 ENCOUNTER — Inpatient Hospital Stay (HOSPITAL_BASED_OUTPATIENT_CLINIC_OR_DEPARTMENT_OTHER): Payer: Medicaid Other | Admitting: Hematology

## 2020-11-19 ENCOUNTER — Other Ambulatory Visit: Payer: Self-pay

## 2020-11-19 ENCOUNTER — Inpatient Hospital Stay: Payer: Medicaid Other | Attending: Hematology

## 2020-11-19 VITALS — BP 132/55 | HR 41 | Resp 17

## 2020-11-19 VITALS — BP 148/57 | HR 44 | Temp 98.3°F | Resp 17 | Wt 231.1 lb

## 2020-11-19 DIAGNOSIS — C189 Malignant neoplasm of colon, unspecified: Secondary | ICD-10-CM

## 2020-11-19 DIAGNOSIS — C184 Malignant neoplasm of transverse colon: Secondary | ICD-10-CM | POA: Insufficient documentation

## 2020-11-19 DIAGNOSIS — C787 Secondary malignant neoplasm of liver and intrahepatic bile duct: Secondary | ICD-10-CM | POA: Diagnosis not present

## 2020-11-19 DIAGNOSIS — Z5112 Encounter for antineoplastic immunotherapy: Secondary | ICD-10-CM | POA: Diagnosis present

## 2020-11-19 DIAGNOSIS — Z95828 Presence of other vascular implants and grafts: Secondary | ICD-10-CM

## 2020-11-19 LAB — CMP (CANCER CENTER ONLY)
ALT: 11 U/L (ref 0–44)
AST: 15 U/L (ref 15–41)
Albumin: 3.6 g/dL (ref 3.5–5.0)
Alkaline Phosphatase: 95 U/L (ref 38–126)
Anion gap: 8 (ref 5–15)
BUN: 21 mg/dL — ABNORMAL HIGH (ref 6–20)
CO2: 20 mmol/L — ABNORMAL LOW (ref 22–32)
Calcium: 9.3 mg/dL (ref 8.9–10.3)
Chloride: 110 mmol/L (ref 98–111)
Creatinine: 1.16 mg/dL — ABNORMAL HIGH (ref 0.44–1.00)
GFR, Estimated: 54 mL/min — ABNORMAL LOW (ref >60)
Glucose, Bld: 128 mg/dL — ABNORMAL HIGH (ref 70–99)
Potassium: 3.7 mmol/L (ref 3.5–5.1)
Sodium: 138 mmol/L (ref 135–145)
Total Bilirubin: 0.5 mg/dL (ref 0.3–1.2)
Total Protein: 7.6 g/dL (ref 6.5–8.1)

## 2020-11-19 LAB — CBC WITH DIFFERENTIAL (CANCER CENTER ONLY)
Abs Immature Granulocytes: 0.02 10*3/uL (ref 0.00–0.07)
Basophils Absolute: 0 10*3/uL (ref 0.0–0.1)
Basophils Relative: 1 %
Eosinophils Absolute: 0.2 10*3/uL (ref 0.0–0.5)
Eosinophils Relative: 3 %
HCT: 33.1 % — ABNORMAL LOW (ref 36.0–46.0)
Hemoglobin: 10.9 g/dL — ABNORMAL LOW (ref 12.0–15.0)
Immature Granulocytes: 0 %
Lymphocytes Relative: 31 %
Lymphs Abs: 2.1 10*3/uL (ref 0.7–4.0)
MCH: 34.5 pg — ABNORMAL HIGH (ref 26.0–34.0)
MCHC: 32.9 g/dL (ref 30.0–36.0)
MCV: 104.7 fL — ABNORMAL HIGH (ref 80.0–100.0)
Monocytes Absolute: 0.6 10*3/uL (ref 0.1–1.0)
Monocytes Relative: 9 %
Neutro Abs: 3.7 10*3/uL (ref 1.7–7.7)
Neutrophils Relative %: 56 %
Platelet Count: 164 10*3/uL (ref 150–400)
RBC: 3.16 MIL/uL — ABNORMAL LOW (ref 3.87–5.11)
RDW: 17.7 % — ABNORMAL HIGH (ref 11.5–15.5)
WBC Count: 6.6 10*3/uL (ref 4.0–10.5)
nRBC: 0 % (ref 0.0–0.2)

## 2020-11-19 LAB — CEA (IN HOUSE-CHCC): CEA (CHCC-In House): 241.52 ng/mL — ABNORMAL HIGH (ref 0.00–5.00)

## 2020-11-19 LAB — TOTAL PROTEIN, URINE DIPSTICK: Protein, ur: <30 mg/dL — AB

## 2020-11-19 MED ORDER — HEPARIN SOD (PORK) LOCK FLUSH 100 UNIT/ML IV SOLN
500.0000 [IU] | Freq: Once | INTRAVENOUS | Status: AC | PRN
  Administered 2020-11-19: 500 [IU]

## 2020-11-19 MED ORDER — SODIUM CHLORIDE 0.9 % IV SOLN
7.5000 mg/kg | Freq: Once | INTRAVENOUS | Status: AC
  Administered 2020-11-19: 800 mg via INTRAVENOUS
  Filled 2020-11-19: qty 32

## 2020-11-19 MED ORDER — SODIUM CHLORIDE 0.9% FLUSH
10.0000 mL | INTRAVENOUS | Status: DC | PRN
  Administered 2020-11-19: 10 mL

## 2020-11-19 MED ORDER — SODIUM CHLORIDE 0.9 % IV SOLN: Freq: Once | INTRAVENOUS | Status: AC

## 2020-11-19 NOTE — Patient Instructions (Signed)
[facility] MEDICAL [facility]COLOG[facility]  Discharge Instructions: Thank you for choosing [facility] to provide your oncology and hematology care.   If you have a lab appointment with the Cancer Center, please go directly to the Cancer Center and check in at the registration area.   Wear comfortable clothing and clothing appropriate for easy access to any Portacath or PICC line.   We strive to give you quality time with your provider. You may need to reschedule your appointment if you arrive late (15 or more minutes).  Arriving late affects you and other patients whose appointments are after yours.  Also, if you miss three or more appointments without notifying the office, you may be dismissed from the clinic at the provider's discretion.      For prescription refill requests, have your pharmacy contact our office and allow 72 hours for refills to be completed.    Today you received the following chemotherapy and/or immunotherapy agents Bevacizumab-bvzr (Zirabev).      To help prevent nausea and vomiting after your treatment, we encourage you to take your nausea medication as directed.  BELOW ARE SYMPTOMS THAT SHOULD BE REPORTED IMMEDIATELY: *FEVER GREATER THAN 100.4 F (38 C) OR HIGHER *CHILLS OR SWEATING *NAUSEA AND VOMITING THAT IS NOT CONTROLLED WITH YOUR NAUSEA MEDICATION *UNUSUAL SHORTNESS OF BREATH *UNUSUAL BRUISING OR BLEEDING *URINARY PROBLEMS (pain or burning when urinating, or frequent urination) *BOWEL PROBLEMS (unusual diarrhea, constipation, pain near the anus) TENDERNESS IN MOUTH AND THROAT WITH OR WITHOUT PRESENCE OF ULCERS (sore throat, sores in mouth, or a toothache) UNUSUAL RASH, SWELLING OR PAIN  UNUSUAL VAGINAL DISCHARGE OR ITCHING   Items with * indicate a potential emergency and should be followed up as soon as possible or go to the Emergency Department if any problems should occur.  Please show the CHEMOTHERAPY ALERT CARD or IMMUNOTHERAPY ALERT  CARD at check-in to the Emergency Department and triage nurse.  Should you have questions after your visit or need to cancel or reschedule your appointment, please contact [facility]  Dept: [phone]  and follow the prompts.  Office hours are 8:00 a.m. to 4:30 p.m. Monday - Friday. Please note that voicemails left after 4:00 p.m. may not be returned until the following business day.  We are closed weekends and major holidays. You have access to a nurse at all times for urgent questions. Please call the main number to the clinic Dept: [phone] and follow the prompts.   For any non-urgent questions, you may also contact your provider using MyChart. We now offer e-Visits for anyone 18 and older to request care online for non-urgent symptoms. For details visit [email or web address].   Also download the MyChart app! Go to the app store, search "MyChart", open the app, select [facility], and log in with your MyChart username and password.  Due to Covid, a mask is required upon entering the hospital/clinic. If you do not have a mask, one will be given to you upon arrival. For doctor visits, patients may have 1 support person aged 18 or older with them. For treatment visits, patients cannot have anyone with them due to current Covid guidelines and our immunocompromised population.   

## 2020-11-19 NOTE — Progress Notes (Signed)
[facility] CSW Progress Notes  Request from medical oncologist to talk w patient about her options for Medicaid and disability income - she is struggling to pay bills, especially unpaid hospital bills from surgery 2 years ago.  Spoke w patient by phone - confirmed she is on [facility] and does receive Social Security Disability income.  She was determined disabled approx 2 years ago.  She applied w help of [employer].  She was advised that she will need to contact [facility] billing department for help w payment plan for unpaid hospital bills.  These may have been from when she had commercial insurance.  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  She was advised to contact [place] to determine when she is eligible for Medicare in the even that her Medicaid is terminated due to being over income.  CSW will provide additional written information about disability and Medicare to patient in infusion.  Edwyna Shell, LCSW Clinical Social Worker Phone:  [phone]

## 2020-11-22 ENCOUNTER — Other Ambulatory Visit: Payer: Self-pay | Admitting: Internal Medicine

## 2020-11-25 ENCOUNTER — Other Ambulatory Visit: Payer: Self-pay | Admitting: Internal Medicine

## 2020-11-25 DIAGNOSIS — I10 Essential (primary) hypertension: Secondary | ICD-10-CM

## 2020-12-01 ENCOUNTER — Telehealth: Payer: Self-pay

## 2020-12-01 ENCOUNTER — Other Ambulatory Visit: Payer: Self-pay

## 2020-12-01 ENCOUNTER — Emergency Department (HOSPITAL_COMMUNITY)
Admission: EM | Admit: 2020-12-01 | Discharge: 2020-12-01 | Disposition: A | Discharge status: Home / Self Care | Payer: Medicaid Other | Attending: Emergency Medicine | Admitting: Emergency Medicine

## 2020-12-01 ENCOUNTER — Encounter (HOSPITAL_COMMUNITY): Payer: Self-pay | Admitting: Emergency Medicine

## 2020-12-01 ENCOUNTER — Emergency Department (HOSPITAL_COMMUNITY): Payer: Medicaid Other

## 2020-12-01 DIAGNOSIS — I10 Essential (primary) hypertension: Secondary | ICD-10-CM | POA: Diagnosis not present

## 2020-12-01 DIAGNOSIS — Z87891 Personal history of nicotine dependence: Secondary | ICD-10-CM | POA: Diagnosis not present

## 2020-12-01 DIAGNOSIS — R1084 Generalized abdominal pain: Secondary | ICD-10-CM | POA: Diagnosis not present

## 2020-12-01 DIAGNOSIS — R112 Nausea with vomiting, unspecified: Secondary | ICD-10-CM | POA: Diagnosis not present

## 2020-12-01 DIAGNOSIS — Z79899 Other long term (current) drug therapy: Secondary | ICD-10-CM | POA: Insufficient documentation

## 2020-12-01 DIAGNOSIS — Z85038 Personal history of other malignant neoplasm of large intestine: Secondary | ICD-10-CM | POA: Diagnosis not present

## 2020-12-01 DIAGNOSIS — R079 Chest pain, unspecified: Secondary | ICD-10-CM

## 2020-12-01 DIAGNOSIS — R109 Unspecified abdominal pain: Secondary | ICD-10-CM | POA: Diagnosis not present

## 2020-12-01 DIAGNOSIS — R110 Nausea: Secondary | ICD-10-CM | POA: Diagnosis not present

## 2020-12-01 DIAGNOSIS — R0789 Other chest pain: Secondary | ICD-10-CM | POA: Diagnosis not present

## 2020-12-01 LAB — URINALYSIS, ROUTINE W REFLEX MICROSCOPIC
Bilirubin Urine: NEGATIVE
Glucose, UA: NEGATIVE mg/dL
Hgb urine dipstick: NEGATIVE
Ketones, ur: NEGATIVE mg/dL
Nitrite: NEGATIVE
Protein, ur: NEGATIVE mg/dL
Specific Gravity, Urine: 1.011 (ref 1.005–1.030)
pH: 6 (ref 5.0–8.0)

## 2020-12-01 LAB — CBC WITH DIFFERENTIAL/PLATELET
Abs Immature Granulocytes: 0.03 10*3/uL (ref 0.00–0.07)
Basophils Absolute: 0 10*3/uL (ref 0.0–0.1)
Basophils Relative: 0 %
Eosinophils Absolute: 0.1 10*3/uL (ref 0.0–0.5)
Eosinophils Relative: 1 %
HCT: 36.6 % (ref 36.0–46.0)
Hemoglobin: 11.9 g/dL — ABNORMAL LOW (ref 12.0–15.0)
Immature Granulocytes: 0 %
Lymphocytes Relative: 27 %
Lymphs Abs: 2.3 10*3/uL (ref 0.7–4.0)
MCH: 34 pg (ref 26.0–34.0)
MCHC: 32.5 g/dL (ref 30.0–36.0)
MCV: 104.6 fL — ABNORMAL HIGH (ref 80.0–100.0)
Monocytes Absolute: 1 10*3/uL (ref 0.1–1.0)
Monocytes Relative: 12 %
Neutro Abs: 5.1 10*3/uL (ref 1.7–7.7)
Neutrophils Relative %: 60 %
Platelets: 204 10*3/uL (ref 150–400)
RBC: 3.5 MIL/uL — ABNORMAL LOW (ref 3.87–5.11)
RDW: 17.2 % — ABNORMAL HIGH (ref 11.5–15.5)
WBC: 8.6 10*3/uL (ref 4.0–10.5)
nRBC: 0 % (ref 0.0–0.2)

## 2020-12-01 LAB — COMPREHENSIVE METABOLIC PANEL
ALT: 11 U/L (ref 0–44)
AST: 18 U/L (ref 15–41)
Albumin: 3.7 g/dL (ref 3.5–5.0)
Alkaline Phosphatase: 73 U/L (ref 38–126)
Anion gap: 11 (ref 5–15)
BUN: 24 mg/dL — ABNORMAL HIGH (ref 6–20)
CO2: 21 mmol/L — ABNORMAL LOW (ref 22–32)
Calcium: 9.8 mg/dL (ref 8.9–10.3)
Chloride: 102 mmol/L (ref 98–111)
Creatinine, Ser: 0.87 mg/dL (ref 0.44–1.00)
GFR, Estimated: >60 mL/min (ref >60)
Glucose, Bld: 97 mg/dL (ref 70–99)
Potassium: 3.5 mmol/L (ref 3.5–5.1)
Sodium: 134 mmol/L — ABNORMAL LOW (ref 135–145)
Total Bilirubin: 1.3 mg/dL — ABNORMAL HIGH (ref 0.3–1.2)
Total Protein: 7.8 g/dL (ref 6.5–8.1)

## 2020-12-01 LAB — TROPONIN I (HIGH SENSITIVITY)
Troponin I (High Sensitivity): 8 ng/L (ref <18)
Troponin I (High Sensitivity): 9 ng/L (ref <18)

## 2020-12-01 LAB — LIPASE, BLOOD: Lipase: 20 U/L (ref 11–51)

## 2020-12-01 MED ORDER — ONDANSETRON HCL 4 MG/2ML IJ SOLN
4.0000 mg | Freq: Once | INTRAMUSCULAR | Status: AC
  Administered 2020-12-01: 4 mg via INTRAVENOUS
  Filled 2020-12-01: qty 2

## 2020-12-01 MED ORDER — SODIUM CHLORIDE 0.9 % IV BOLUS
500.0000 mL | Freq: Once | INTRAVENOUS | Status: AC
  Administered 2020-12-01: 500 mL via INTRAVENOUS

## 2020-12-01 MED ORDER — HEPARIN SOD (PORK) LOCK FLUSH 100 UNIT/ML IV SOLN
500.0000 [IU] | Freq: Once | INTRAVENOUS | Status: AC
  Administered 2020-12-01: 500 [IU]
  Filled 2020-12-01: qty 5

## 2020-12-01 NOTE — ED Notes (Signed)
Signature pad in room does not work. Pt asked RN to sign

## 2020-12-01 NOTE — Telephone Encounter (Signed)
Pt called and states she has abd pain and (R) chest pain. Pt states she had a sandwich Saturday and has been feeling sick since but is concerned about the right chest pain, which has become slightly better. Advised pt to go to ED since she is having chest pain. Pt verbalized agreement and understanding.

## 2020-12-01 NOTE — ED Provider Notes (Signed)
[facility] DEPT Provider Note   CSN: [id number] Arrival date & time: 12/01/20  1659     History Chief Complaint  Patient presents with   Nausea   Ca. Pt    Casey Black is a 60 y.o. female.  She has a history of colon cancer status postresection and is still on chemotherapy.  She said 2 days ago she ate a sandwich and since then she has not felt well.  She has had some nausea and vomiting.  She had some sharp stabbing chest pain and abdominal pain.  She has noticed decreased output from her colostomy.  She feels that the chest pain and abdominal pain are improved although she still feels nauseous.  She has not had any vomiting today.  She still is not having much output from her colostomy.  No fevers or chills.  The history is provided by the patient.  Abdominal Pain Pain location:  Generalized Pain quality: stabbing   Pain radiates to:  Chest Pain severity:  Moderate Onset quality:  Gradual Duration:  3 days Timing:  Constant Progression:  Improving Chronicity:  New Context: not trauma   Relieved by:  Nothing Worsened by:  Nothing Ineffective treatments:  None tried Associated symptoms: chest pain, nausea and vomiting   Associated symptoms: no cough, no diarrhea, no dysuria, no fever, no hematemesis and no sore throat   Vomiting:    Quality:  Bilious material   Severity:  Moderate   Duration:  1 day   Timing:  Sporadic   Progression:  Improving Risk factors: multiple surgeries       Past Medical History:  Diagnosis Date   Anemia    low iron   Cancer (HCC)    colon cancer   Colon cancer (HCC) 01/2019   Hypertension 01/23/2019   Personal history of chemotherapy 01/2019   colon CA   SBO (small bowel obstruction) ([place]) 01/23/2019    Patient Active Problem List   Diagnosis Date Noted   Onychomycosis due to dermatophyte 06/11/2020   Chemotherapy-induced peripheral neuropathy ([place]) 09/24/2019   Trichomonas vaginalis infection  07/24/2019   ASCUS of cervix with negative high risk HPV 07/24/2019   Port-A-Cath in place 03/12/2019   Goals of care, counseling/discussion 02/15/2019   IDA (iron deficiency anemia) 02/02/2019   Adenocarcinoma of colon metastatic to liver ([place]) 02/02/2019   SBO (small bowel obstruction) ([place]) 01/23/2019   Hypertension 01/23/2019   Tobacco dependence 01/23/2019    Past Surgical History:  Procedure Laterality Date   CESAREAN SECTION     x2   COLOSTOMY N/A 01/24/2019   Procedure: End Loop Colostomy;  Surgeon: Clovis Riley, MD;  Location: [facility];  Service: General;  Laterality: N/A;   PARTIAL COLECTOMY N/A 01/24/2019   Procedure: PARTIAL COLECTOMY;  Surgeon: Clovis Riley, MD;  Location: [facility];  Service: General;  Laterality: N/A;   PORTACATH PLACEMENT Right 02/28/2019   Procedure: INSERTION PORT-A-CATH WITH ULTRASOUND GUIDANCE;  Surgeon: Clovis Riley, MD;  Location: [facility];  Service: General;  Laterality: Right;     OB History   No obstetric history on file.     Family History  Problem Relation Age of Onset   Kidney failure Father    Hypertension Father    Hypertension Sister     Social History   Tobacco Use   Smoking status: Former    Packs/day: 0.50    Years: 29.00    Pack years: 14.50    Types:  Cigarettes    Quit date: 01/04/2019    Years since quitting: 1.9   Smokeless tobacco: Never   Tobacco comments:    desires patch  Vaping Use   Vaping Use: Never used  Substance Use Topics   Alcohol use: Not Currently    Comment: a pint a week   Drug use: Never    Home Medications Prior to Admission medications   Medication Sig Start Date End Date Taking? Authorizing Provider  acetaminophen (TYLENOL) 325 MG tablet Take 2 tablets (650 mg total) by mouth every 6 (six) hours as needed. 02/03/19   Debbe Odea, MD  amLODipine (NORVASC) 10 MG tablet TAKE 1 TABLET BY MOUTH EVERY DAY 11/25/20   [name], [name], MD  benzonatate (TESSALON) 100 MG  capsule Take 1 capsule (100 mg total) by mouth every 8 (eight) hours. 05/01/20   Deno Etienne, DO  capecitabine (XELODA) 500 MG tablet Take 4 tablets (2,000 mg total) by mouth 2 (two) times daily after a meal. For 14 days, then off for 7 days. 10/31/20 10/31/21  Truitt Merle, MD  ferrous sulfate 325 (65 FE) MG tablet TAKE 1 TABLET (325 MG TOTAL) BY MOUTH 2 (TWO) TIMES DAILY WITH A MEAL. 06/23/20 06/23/21  Truitt Merle, MD  gabapentin (NEURONTIN) 300 MG capsule Take 1 capsule (300 mg total) by mouth 2 (two) times daily. 08/27/20   Truitt Merle, MD  hydrALAZINE (APRESOLINE) 25 MG tablet TAKE 1 TABLET BY MOUTH EVERY 8 HOURS 11/24/20   [name], [name], MD  hydrochlorothiazide (HYDRODIURIL) 12.5 MG tablet Take 1 tablet (12.5 mg total) by mouth daily. 07/01/20   [name], [name], MD  hydrocortisone (ANUSOL-HC) 2.5 % rectal cream Place 1 application rectally 2 (two) times daily. 08/23/19   Joy, Shawn C, PA-C  Ibuprofen 200 MG CAPS Take 400 mg by mouth daily as needed (pain).    [provider]  isosorbide mononitrate (IMDUR) 30 MG 24 hr tablet TAKE 1 TABLET BY MOUTH EVERY DAY 10/13/20   [name], [name], MD  lidocaine (LMX) 4 % cream Apply 1 application topically 3 (three) times daily as needed. 08/23/19   Joy, Shawn C, PA-C  lisinopril (ZESTRIL) 40 MG tablet Take 1 tablet (40 mg total) by mouth daily. 11/14/20   [name], [name], MD  loperamide (IMODIUM) 2 MG capsule TAKE 1 CAPSULE (2 MG TOTAL) BY MOUTH AS NEEDED FOR DIARRHEA OR LOOSE STOOLS. 07/16/19   Truitt Merle, MD  metoprolol tartrate (LOPRESSOR) 50 MG tablet TAKE 1 TABLET BY MOUTH TWICE A DAY 11/05/20   [name], [name], MD  ondansetron (ZOFRAN ODT) 4 MG disintegrating tablet '4mg'$  ODT q4 hours prn nausea/vomit 05/01/20   Deno Etienne, DO  ondansetron (ZOFRAN) 4 MG tablet Take 1 tablet (4 mg total) by mouth every 6 (six) hours as needed for nausea. 02/03/19   Debbe Odea, MD  polycarbophil (FIBERCON) 625 MG tablet Take 1  tablet (625 mg total) by mouth daily. 02/03/19   Debbe Odea, MD  prochlorperazine (COMPAZINE) 10 MG tablet Take 1 tablet (10 mg total) by mouth every 6 (six) hours as needed (Nausea or vomiting). 02/15/19 01/21/20  Truitt Merle, MD    Allergies    Patient has no known allergies.  Review of Systems   Review of Systems  Constitutional:  Negative for fever.  HENT:  Negative for sore throat.   Eyes:  Negative for visual disturbance.  Respiratory:  Negative for cough.   Cardiovascular:  Positive for  chest pain.  Gastrointestinal:  Positive for abdominal pain, nausea and vomiting. Negative for diarrhea and hematemesis.  Genitourinary:  Negative for dysuria.  Musculoskeletal:  Negative for neck pain.  Skin:  Negative for rash.  Neurological:  Negative for headaches.   Physical Exam Updated Vital Signs BP (!) 150/73 (BP Location: Left Arm)   Pulse 60   Temp 98.6 F (37 C) (Oral)   Resp 18   Ht '5\' 7"'$  (1.702 m)   Wt 104.8 kg   SpO2 100%   BMI 36.20 kg/m   Physical Exam Vitals and nursing note reviewed.  Constitutional:      General: She is not in acute distress.    Appearance: Normal appearance. She is well-developed.  HENT:     Head: Normocephalic and atraumatic.  Eyes:     Conjunctiva/sclera: Conjunctivae normal.  Cardiovascular:     Rate and Rhythm: Normal rate and regular rhythm.     Heart sounds: No murmur heard. Pulmonary:     Effort: Pulmonary effort is normal. No respiratory distress.     Breath sounds: Normal breath sounds.     Comments: Port in right upper chest nontender Abdominal:     General: There is no distension.     Palpations: Abdomen is soft.     Tenderness: There is no abdominal tenderness. There is no guarding or rebound.     Comments: She has a colostomy in her right mid abdomen.  Stoma is pink.  Musculoskeletal:        General: Normal range of motion.     Cervical back: Neck supple.     Right lower leg: No edema.     Left lower leg: No edema.   Skin:    General: Skin is warm and dry.  Neurological:     General: No focal deficit present.     Mental Status: She is alert.    ED Results / Procedures / Treatments   Labs (all labs ordered are listed, but only abnormal results are displayed) Labs Reviewed  CBC WITH DIFFERENTIAL/PLATELET - Abnormal; Notable for the following components:      Result Value   RBC 3.50 (*)    Hemoglobin 11.9 (*)    MCV 104.6 (*)    RDW 17.2 (*)    All other components within normal limits  COMPREHENSIVE METABOLIC PANEL - Abnormal; Notable for the following components:   Sodium 134 (*)    CO2 21 (*)    BUN 24 (*)    Total Bilirubin 1.3 (*)    All other components within normal limits  URINALYSIS, ROUTINE W REFLEX MICROSCOPIC - Abnormal; Notable for the following components:   Leukocytes,Ua SMALL (*)    Bacteria, UA RARE (*)    All other components within normal limits  LIPASE, BLOOD  TROPONIN I (HIGH SENSITIVITY)  TROPONIN I (HIGH SENSITIVITY)    EKG EKG Interpretation  Date/Time:  Monday December 01 2020 18:01:18 EDT Ventricular Rate:  51 PR Interval:  134 QRS Duration: 106 QT Interval:  442 QTC Calculation: 408 R Axis:   -6 Text Interpretation: Sinus rhythm Anteroseptal infarct, old Borderline T abnormalities, inferior leads Confirmed by Aletta Edouard [phone]) on 12/01/2020 6:06:45 PM  Radiology DG Abdomen Acute W/Chest  Result Date: 12/01/2020 CLINICAL DATA:  Abdomen pain with nausea EXAM: DG ABDOMEN ACUTE WITH 1 VIEW CHEST COMPARISON:  CT 11/17/2020 FINDINGS: Single-view chest demonstrates right-sided central venous port tip over the SVC. No focal opacity or pleural effusion. Normal cardiomediastinal silhouette. No  pneumothorax. Supine and upright views of the abdomen demonstrate no free air beneath the diaphragm. Right abdominal colostomy. Mild air-filled central small bowel with fluid levels but with otherwise normal gas pattern. IMPRESSION: Negative abdominal radiographs.  No acute  cardiopulmonary disease. Electronically Signed   By: Donavan Foil M.D.   On: 12/01/2020 19:21    Procedures Procedures   Medications Ordered in ED Medications  sodium chloride 0.9 % bolus 500 mL (0 mLs Intravenous Stopped 12/01/20 1954)  ondansetron (ZOFRAN) injection 4 mg (4 mg Intravenous Given 12/01/20 1831)  heparin lock flush 100 unit/mL (500 Units Intracatheter Given 12/01/20 2142)    ED Course  I have reviewed the triage vital signs and the nursing notes.  Pertinent labs & imaging results that were available during my care of the patient were reviewed by me and considered in my medical decision making (see chart for details).  Clinical Course as of 12/02/20 1105  Mon Dec 01, 2020  2129 Patient tolerated p.o. here and feels better.  She is comfortable plan for outpatient follow-up with her providers.  Return instructions discussed [MB]    Clinical Course User Index [MB] Hayden Rasmussen, MD   MDM Rules/Calculators/A&P                          This patient complains of chest and abdominal pain nausea decreased colostomy output; this involves an extensive number of treatment Options and is a complaint that carries with it a high risk of complications and Morbidity. The differential includes obstruction, AGE, dehydration, colitis, ACS  I ordered, reviewed and interpreted labs, which included CBC with normal white count, hemoglobin low but similar to priors, chemistries with low bicarb similar to priors, urinalysis without clear signs of infection I ordered medication IV fluids and nausea medication I ordered imaging studies which included abdominal series and I independently    visualized and interpreted imaging which showed no evidence of obstruction Additional history obtained from patient's daughter Previous records obtained and reviewed in epic including prior oncology visits  After the interventions stated above, I reevaluated the patient and found patient be feeling  improved.  No nausea or vomiting.  Has been tolerating p.o.  Ambulatory in the department without any difficulty.  Do not feel needs further imaging at this time.  Recommended close follow-up with her providers.  Return instructions discussed   Final Clinical Impression(s) / ED Diagnoses Final diagnoses:  Non-intractable vomiting with nausea, unspecified vomiting type  Generalized abdominal pain  Nonspecific chest pain    Rx / DC Orders ED Discharge Orders     None        Hayden Rasmussen, MD 12/02/20 1108

## 2020-12-01 NOTE — Discharge Instructions (Addendum)
You were seen in the emergency department for nausea vomiting abdominal and chest pain along with decreased output from your stoma.  You had lab work done along with an x-ray that did not show any significant findings.  Your symptoms were feeling better so we will hold off on doing a CAT scan for now.  Please drink plenty of fluids.  Follow-up with your treating providers.  Return to the emergency department if any worsening or concerning symptoms

## 2020-12-01 NOTE — ED Provider Notes (Signed)
Emergency Medicine Provider Triage Evaluation Note  Casey Black , a 60 y.o. female  was evaluated in triage.  Pt complains of chest pain, abd pain, nv. States there has been no output from her colostomy bag for 2 days  Review of Systems  Positive: Chest pain, abd pain, vd Negative: fever  Physical Exam  BP (!) 150/73 (BP Location: Left Arm)   Pulse 60   Temp 98.6 F (37 C) (Oral)   Resp 18   Ht '5\' 7"'$  (1.702 m)   Wt 104.8 kg   SpO2 100%   BMI 36.20 kg/m  Gen:   Awake, no distress   Resp:  Normal effort  MSK:   Moves extremities without difficulty  Other:    Medical Decision Making  Medically screening exam initiated at 5:45 PM.  Appropriate orders placed.  Katja Cocks was informed that the remainder of the evaluation will be completed by another provider, this initial triage assessment does not replace that evaluation, and the importance of remaining in the ED until their evaluation is complete.     Bishop Dublin 12/01/20 1746    Lacretia Leigh, MD 12/02/20 1525

## 2020-12-01 NOTE — ED Triage Notes (Signed)
Pt reports she has been having nausea since Saturday and started vomiting on Sunday. Hx of IV colon cancer. Pt also reports chest "burning" and not colostomy not having any output since yesterday.

## 2020-12-02 ENCOUNTER — Other Ambulatory Visit (HOSPITAL_COMMUNITY): Payer: Self-pay

## 2020-12-02 ENCOUNTER — Telehealth: Payer: Self-pay | Admitting: *Deleted

## 2020-12-02 NOTE — Telephone Encounter (Signed)
Transition Care Management Follow-up Telephone Call Date of discharge and from where: 12/01/2020 - [facility] How have you been since you were released from the hospital? "Feeling a little better" Any questions or concerns? No  Items Reviewed: Did the pt receive and understand the discharge instructions provided? Yes  Medications obtained and verified? Yes  Other? No  Any new allergies since your discharge? No  Dietary orders reviewed? No Do you have support at home? Yes    Functional Questionnaire: (I = Independent and D = Dependent) ADLs: I  Bathing/Dressing- I  Meal Prep- I  Eating- I  Maintaining continence- I  Transferring/Ambulation- I  Managing Meds- I  Follow up appointments reviewed:  PCP Hospital f/u appt confirmed? No   Specialist Hospital f/u appt confirmed? No   Are transportation arrangements needed? No  If their condition worsens, is the pt aware to call PCP or go to the Emergency Dept.? Yes Was the patient provided with contact information for the PCP's office or ED? Yes Was to pt encouraged to call back with questions or concerns? Yes

## 2020-12-04 ENCOUNTER — Other Ambulatory Visit (HOSPITAL_COMMUNITY): Payer: Self-pay

## 2020-12-09 ENCOUNTER — Other Ambulatory Visit (HOSPITAL_COMMUNITY): Payer: Self-pay

## 2020-12-12 ENCOUNTER — Encounter: Payer: Self-pay | Admitting: Hematology

## 2020-12-12 ENCOUNTER — Other Ambulatory Visit: Payer: Self-pay

## 2020-12-12 ENCOUNTER — Inpatient Hospital Stay: Payer: Medicaid Other

## 2020-12-12 ENCOUNTER — Ambulatory Visit: Payer: Medicaid Other

## 2020-12-12 ENCOUNTER — Inpatient Hospital Stay: Payer: Medicaid Other | Attending: Hematology | Admitting: Hematology

## 2020-12-12 VITALS — HR 44

## 2020-12-12 VITALS — BP 127/52 | Temp 97.9°F | Resp 18 | Ht 67.0 in | Wt 232.4 lb

## 2020-12-12 DIAGNOSIS — Z5112 Encounter for antineoplastic immunotherapy: Secondary | ICD-10-CM | POA: Insufficient documentation

## 2020-12-12 DIAGNOSIS — C787 Secondary malignant neoplasm of liver and intrahepatic bile duct: Secondary | ICD-10-CM | POA: Diagnosis not present

## 2020-12-12 DIAGNOSIS — C778 Secondary and unspecified malignant neoplasm of lymph nodes of multiple regions: Secondary | ICD-10-CM | POA: Insufficient documentation

## 2020-12-12 DIAGNOSIS — Z95828 Presence of other vascular implants and grafts: Secondary | ICD-10-CM

## 2020-12-12 DIAGNOSIS — C184 Malignant neoplasm of transverse colon: Secondary | ICD-10-CM | POA: Insufficient documentation

## 2020-12-12 DIAGNOSIS — I10 Essential (primary) hypertension: Secondary | ICD-10-CM | POA: Diagnosis not present

## 2020-12-12 DIAGNOSIS — C189 Malignant neoplasm of colon, unspecified: Secondary | ICD-10-CM

## 2020-12-12 LAB — CBC WITH DIFFERENTIAL (CANCER CENTER ONLY)
Abs Immature Granulocytes: 0.01 10*3/uL (ref 0.00–0.07)
Basophils Absolute: 0 10*3/uL (ref 0.0–0.1)
Basophils Relative: 1 %
Eosinophils Absolute: 0.2 10*3/uL (ref 0.0–0.5)
Eosinophils Relative: 3 %
HCT: 32.3 % — ABNORMAL LOW (ref 36.0–46.0)
Hemoglobin: 10.5 g/dL — ABNORMAL LOW (ref 12.0–15.0)
Immature Granulocytes: 0 %
Lymphocytes Relative: 28 %
Lymphs Abs: 2.1 10*3/uL (ref 0.7–4.0)
MCH: 34.5 pg — ABNORMAL HIGH (ref 26.0–34.0)
MCHC: 32.5 g/dL (ref 30.0–36.0)
MCV: 106.3 fL — ABNORMAL HIGH (ref 80.0–100.0)
Monocytes Absolute: 0.6 10*3/uL (ref 0.1–1.0)
Monocytes Relative: 8 %
Neutro Abs: 4.4 10*3/uL (ref 1.7–7.7)
Neutrophils Relative %: 60 %
Platelet Count: 183 10*3/uL (ref 150–400)
RBC: 3.04 MIL/uL — ABNORMAL LOW (ref 3.87–5.11)
RDW: 17.9 % — ABNORMAL HIGH (ref 11.5–15.5)
WBC Count: 7.3 10*3/uL (ref 4.0–10.5)
nRBC: 0 % (ref 0.0–0.2)

## 2020-12-12 LAB — CMP (CANCER CENTER ONLY)
ALT: 10 U/L (ref 0–44)
AST: 15 U/L (ref 15–41)
Albumin: 3.3 g/dL — ABNORMAL LOW (ref 3.5–5.0)
Alkaline Phosphatase: 93 U/L (ref 38–126)
Anion gap: 8 (ref 5–15)
BUN: 26 mg/dL — ABNORMAL HIGH (ref 6–20)
CO2: 18 mmol/L — ABNORMAL LOW (ref 22–32)
Calcium: 9.4 mg/dL (ref 8.9–10.3)
Chloride: 111 mmol/L (ref 98–111)
Creatinine: 1.35 mg/dL — ABNORMAL HIGH (ref 0.44–1.00)
GFR, Estimated: 45 mL/min — ABNORMAL LOW (ref >60)
Glucose, Bld: 90 mg/dL (ref 70–99)
Potassium: 4.6 mmol/L (ref 3.5–5.1)
Sodium: 137 mmol/L (ref 135–145)
Total Bilirubin: 0.4 mg/dL (ref 0.3–1.2)
Total Protein: 7.5 g/dL (ref 6.5–8.1)

## 2020-12-12 LAB — CEA (IN HOUSE-CHCC): CEA (CHCC-In House): 322.01 ng/mL — ABNORMAL HIGH (ref 0.00–5.00)

## 2020-12-12 LAB — TOTAL PROTEIN, URINE DIPSTICK: Protein, ur: <30 mg/dL

## 2020-12-12 MED ORDER — SODIUM CHLORIDE 0.9 % IV SOLN: Freq: Once | INTRAVENOUS | Status: AC

## 2020-12-12 MED ORDER — HEPARIN SOD (PORK) LOCK FLUSH 100 UNIT/ML IV SOLN
500.0000 [IU] | Freq: Once | INTRAVENOUS | Status: AC | PRN
  Administered 2020-12-12: 500 [IU]

## 2020-12-12 MED ORDER — SODIUM CHLORIDE 0.9% FLUSH
10.0000 mL | INTRAVENOUS | Status: DC | PRN
  Administered 2020-12-12: 10 mL

## 2020-12-12 MED ORDER — SODIUM CHLORIDE 0.9 % IV SOLN
7.5000 mg/kg | Freq: Once | INTRAVENOUS | Status: AC
  Administered 2020-12-12: 800 mg via INTRAVENOUS
  Filled 2020-12-12: qty 32

## 2020-12-12 NOTE — Patient Instructions (Signed)
[facility]  Discharge Instructions: °Thank you for choosing [facility] to provide your oncology and hematology care.  ° °If you have a lab appointment with the Cancer Center, please go directly to the Cancer Center and check in at the registration area. °  °Wear comfortable clothing and clothing appropriate for easy access to any Portacath or PICC line.  ° °We strive to give you quality time with your provider. You may need to reschedule your appointment if you arrive late (15 or more minutes).  Arriving late affects you and other patients whose appointments are after yours.  Also, if you miss three or more appointments without notifying the office, you may be dismissed from the clinic at the provider’s discretion.    °  °For prescription refill requests, have your pharmacy contact our office and allow 72 hours for refills to be completed.   ° °Today you received the following chemotherapy and/or immunotherapy agents: Bevacizumab.     °  °To help prevent nausea and vomiting after your treatment, we encourage you to take your nausea medication as directed. ° °BELOW ARE SYMPTOMS THAT SHOULD BE REPORTED IMMEDIATELY: °*FEVER GREATER THAN 100.4 F (38 °C) OR HIGHER °*CHILLS OR SWEATING °*NAUSEA AND VOMITING THAT IS NOT CONTROLLED WITH YOUR NAUSEA MEDICATION °*UNUSUAL SHORTNESS OF BREATH °*UNUSUAL BRUISING OR BLEEDING °*URINARY PROBLEMS (pain or burning when urinating, or frequent urination) °*BOWEL PROBLEMS (unusual diarrhea, constipation, pain near the anus) °TENDERNESS IN MOUTH AND THROAT WITH OR WITHOUT PRESENCE OF ULCERS (sore throat, sores in mouth, or a toothache) °UNUSUAL RASH, SWELLING OR PAIN  °UNUSUAL VAGINAL DISCHARGE OR ITCHING  ° °Items with * indicate a potential emergency and should be followed up as soon as possible or go to the Emergency Department if any problems should occur. ° °Please show the CHEMOTHERAPY ALERT CARD or IMMUNOTHERAPY ALERT CARD at check-in  to the Emergency Department and triage nurse. ° °Should you have questions after your visit or need to cancel or reschedule your appointment, please contact [facility]  Dept: [phone]  and follow the prompts.  Office hours are 8:00 a.m. to 4:30 p.m. Monday - Friday. Please note that voicemails left after 4:00 p.m. may not be returned until the following business day.  We are closed weekends and major holidays. You have access to a nurse at all times for urgent questions. Please call the main number to the clinic Dept: [phone] and follow the prompts. ° ° °For any non-urgent questions, you may also contact your provider using MyChart. We now offer e-Visits for anyone 18 and older to request care online for non-urgent symptoms. For details visit [email or web address]. °  °Also download the MyChart app! Go to the app store, search "MyChart", open the app, select [facility], and log in with your MyChart username and password. ° °Due to Covid, a mask is required upon entering the hospital/clinic. If you do not have a mask, one will be given to you upon arrival. For doctor visits, patients may have 1 support person aged 18 or older with them. For treatment visits, patients cannot have anyone with them due to current Covid guidelines and our immunocompromised population.  ° °

## 2020-12-12 NOTE — Progress Notes (Signed)
[place]   [phone] [phone]   Clinic Follow up Note   Patient Care Team: [name], [name], MD as PCP - General (Internal Medicine) Clovis Riley, MD as Consulting Physician (General Surgery) Truitt Merle, MD as Consulting Physician (Hematology) [name], NP as Nurse Practitioner (Nurse Practitioner)  Date of Service:  12/12/2020  CHIEF COMPLAINT: f/u of metastatic colon cancer  CURRENT THERAPY:  Maintenance Avastin and Xeloda 2066m q12h on day 1-14 every 3 weeks   ASSESSMENT & PLAN:  MDhara Scheppis a 60y.o. female with   1. 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 -PET from 02/26/19 shows known liver metastasis and metastatic lymphadenopathy in chest and right axilla. -[name] shows MSI stable disease, Kras+ No targetable mutation, she is not a candidate for EGFR or immunotherapy. -Began palliative first line chemo on 03/12/2019, received 5FU/leuc with first 2 cycles for large open abdominal wound after surgery. She started full dose FOLFOX and avastin with cycle 2. Due to neuropathy Oxaliplatin was stopped after 09/24/19, she has been on maintenance therapy. -For convenience she was switched to oral chemo Xeloda 2 weeks on/1 week off and Bev every 3 weeks in 01/21/2020.  Treatment held 05/01/2020 - 05/12/2020 due to COVID infection -restaging CT 11/17/20 showed stable liver mets, no new lesions or evidence of progression. -Continue Xeloda and bevacizumab maintenance therapy. She is tolerating very well    2. Chemotherapy induced peripheral neuropathy (CIPN), G1 -secondary to Oxaliplatin, developed after cycle 12 FOLFOX. Oxaliplatin d/c after C15 on 09/24/19 -Neuropathy in fingers and feet are  moderate and stable with mild decreased function in fingers.  -She is currently on gabapentin 100 mg AM and 300 mg qHS and titrate up if needed.  -overall improved, no impact on her function now  -Will continue to monitor.    3. 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 Will give IV iron if needed  -Mild and stable anemia lately. Hgb 10.5 today (12/12/20)   4. HTN, uncontrolled  -on hydralazine, isosorbide, lisinopril, metoprolol, amlodipine and HCTZ  -Will monitor on bevacizumab.    5. Goals of care discussion, Social support -The patient understands the goal of care is palliative. She is full code now  -She worked in [employer] 2 different jobs but has been on disability since her diagnosis.   9. COVID (+) 05/01/20 -She tested positive for COVID on 05/01/20. She received treatment infusion on 05/06/20. Symptoms overall resolved after treatment.  -Per patient she received 2 COVID vaccines. Will proceed with COVID booster      PLAN: -proceed with bevacizumab today, she start Xeloda this morning  -lab, flush, f/u, and beva in 3 and 6 weeks   No problem-specific Assessment & Plan notes found for this encounter.   SUMMARY OF ONCOLOGIC HISTORY: Oncology History Overview Note  Cancer Staging Adenocarcinoma of colon metastatic to liver ([facility] Staging form: Colon and Rectum, AJCC 8th Edition - Pathologic stage from 01/24/2019: Stage IVA (pT4a, pN1a, pM1a) - Signed by BAlla Feeling NP on 02/14/2019    [place]  01/23/2019 Imaging   ABD Xray IMPRESSION: 1. Bowel-gas pattern consistent with small bowel obstruction. No  free air. 2. No acute chest findings.   01/23/2019 Imaging   CT AP IMPRESSION: Obstructing mid transverse colonic mass with mild regional adenopathy and hepatic metastatic disease. The mass likely extends through the serosa; no ascites  or peritoneal nodularity.   01/24/2019 Surgery   Surgeon: Clovis Riley MD Assistant: [name] Procedure performed: Transverse colectomy with end colostomy, liver biopsy Procedure classification: URGENT/EMERGENT Preop diagnosis: Obstructing, metastatic transverse colon mass Post-op diagnosis/intraop findings: Same   01/24/2019 Pathology Results   FINAL MICROSCOPIC DIAGNOSIS:   A. COLON, TRANSVERSE, RESECTION:  Colonic adenocarcinoma, 5 cm.  Carcinoma extends into pericolonic connective tissue and focally to  serosal surface.  Margins not involved.  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   B. LIVER NODULE, LEFT, BIOPSY:  Metastatic adenocarcinoma.    01/24/2019 Cancer Staging   Staging form: Colon and Rectum, AJCC 8th Edition - Pathologic stage from 01/24/2019: Stage IVA (pT4a, pN1a, pM1a) - Signed by [name], NP on 02/14/2019   02/02/2019 Initial Diagnosis   Adenocarcinoma of colon metastatic to liver ([place])   02/26/2019 PET scan   IMPRESSION: 1. Hypermetabolic metastatic disease in the liver and mediastinal/hilar/axillary lymph nodes. 2. Focal hypermetabolism in the rectum. Continued attention on follow-up exams is warranted. 3. Focal hypermetabolism medial to the right adrenal gland may be within a metastatic lymph node, better visualized on 01/23/2019. 4. Aortic atherosclerosis (ICD10-170.0). Coronary artery calcification.   03/12/2019 -  Chemotherapy   She started 5FU q2weeks on 03/12/19 for 2 cycles. She started full dose FOLFOX with Avastin on 04/09/19. Oxaliplatin dose reduced repeatedly due to neuropathy C12 and held since C16 on 10/09/19. Now on maintenance Avastin and 5FU q2weeks since 10/09/19       -Maintenance change to maintenance xeloda 2000 mg BID days 1-14 q21 days and q3 weeks Zirabev (15 mg/kg) starting 01/21/20. First cycle was taken 1034m BID due to misunderstanding.    05/31/2019 Imaging   Restaging CT CAP  IMPRESSION: 1. Similar to mild interval decrease in size of multiple hepatic lesions, partially calcified. 2. 2 mm right upper lobe pulmonary nodule. Recommend attention on follow-up. 3. Emphysema and aortic atherosclerosis.   08/23/2019 Imaging   CT CAP w contrast  IMPRESSION: 1. The dominant peripheral right liver metastasis has mildly increased. Other smaller liver metastases are stable. 2. Otherwise no new or progressive metastatic disease in the chest, abdomen or pelvis. 3. Aortic Atherosclerosis (ICD10-I70.0) and Emphysema (ICD10-J43.9).   11/27/2019 Imaging   CT CAP w contrast  IMPRESSION: Status post transverse colectomy with right mid abdominal colostomy.   Mildly progressive hepatic metastases, as above.   No evidence of metastatic disease in the chest. Small mediastinal lymph nodes are within normal limits.   Additional stable ancillary findings as above.   02/21/2020 Imaging   IMPRESSION: 1. Stable hepatic metastatic disease. 2. Aortic atherosclerosis (ICD10-I70.0). Coronary artery calcification. 3.  Emphysema (ICD10-J43.9).   05/19/2020 Imaging   CT CAP  IMPRESSION: 1. Multiple partially calcified liver metastases are again noted. With the exception of a small lesion in segment 7/8 lesions are not significantly changed in the interval. No new liver lesions identified. 2. Coronary artery atherosclerotic calcifications. 3. Aortic atherosclerosis. 4. 2 mm right upper lobe lung nodule identified.  Unchanged.   Aortic Atherosclerosis (ICD10-I70.0).   11/17/2020 Imaging   CT CAP  IMPRESSION: 1. Partially calcified lesions throughout the liver are stable accounting for differences in technique, contrasted imaging on today's study is compared to noncontrast imaging  on the prior. 2. No new hepatic lesions. 3. Tiny 3 mm pulmonary nodule in the RIGHT upper lobe unchanged since the prior study. Attention on follow-up. 4. Mild fullness of RIGHT paratracheal nodal  tissue is minimally increased and borderline enlarged, attention on follow-up. 5. RIGHT lower quadrant colostomy. 6. 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 7. Aortic atherosclerosis.      INTERVAL HISTORY:  Casey Black is here for a follow up of metastatic colon cancer. She was last seen by me on 11/19/20. She presents to the clinic alone. She reports doing well overall. She notes she went to the ED since her last visit because of a sandwich she ate. "I was feeling well until I ate that sandwich!" She notes she gets sick occasionally, depending on what she eats. She reports some very mild neuropathy in her fingers.   All other systems were reviewed with the patient and are negative.  MEDICAL HISTORY:  Past Medical History:  Diagnosis Date   Anemia    low iron   Cancer (HCC)    colon cancer   Colon cancer ([place]) 01/2019   Hypertension 01/23/2019   Personal history of chemotherapy 01/2019   colon CA   SBO (small bowel obstruction) ([place]) 01/23/2019    SURGICAL HISTORY: Past Surgical History:  Procedure Laterality Date   CESAREAN SECTION     x2   COLOSTOMY N/A 01/24/2019   Procedure: End Loop Colostomy;  Surgeon: Clovis Riley, MD;  Location: [facility];  Service: General;  Laterality: N/A;   PARTIAL COLECTOMY N/A 01/24/2019   Procedure: PARTIAL COLECTOMY;  Surgeon: Clovis Riley, MD;  Location: [place] [facility];  Service: General;  Laterality: N/A;   PORTACATH PLACEMENT Right 02/28/2019   Procedure: INSERTION PORT-A-CATH WITH ULTRASOUND GUIDANCE;  Surgeon: Clovis Riley, MD;  Location: [facility];  Service: General;  Laterality: Right;    I have reviewed the social history and family history with the patient and they are unchanged from previous note.  ALLERGIES:  has No Known Allergies.  MEDICATIONS:  Current Outpatient Medications  Medication Sig Dispense  Refill   acetaminophen (TYLENOL) 325 MG tablet Take 2 tablets (650 mg total) by mouth every 6 (six) hours as needed.     amLODipine (NORVASC) 10 MG tablet TAKE 1 TABLET BY MOUTH EVERY DAY 90 tablet 1   benzonatate (TESSALON) 100 MG capsule Take 1 capsule (100 mg total) by mouth every 8 (eight) hours. 21 capsule 0   capecitabine (XELODA) 500 MG tablet Take 4 tablets (2,000 mg total) by mouth 2 (two) times daily after a meal. For 14 days, then off for 7 days. 112 tablet 2   ferrous sulfate 325 (65 FE) MG tablet TAKE 1 TABLET (325 MG TOTAL) BY MOUTH 2 (TWO) TIMES DAILY WITH A MEAL. 180 tablet 1   gabapentin (NEURONTIN) 300 MG capsule Take 1 capsule (300 mg total) by mouth 2 (two) times daily. 60 capsule 3   hydrALAZINE (APRESOLINE) 25 MG tablet TAKE 1 TABLET BY MOUTH EVERY 8 HOURS 90 tablet 0   hydrochlorothiazide (HYDRODIURIL) 12.5 MG tablet Take 1 tablet (12.5 mg total) by mouth daily. 90 tablet 1   hydrocortisone (ANUSOL-HC) 2.5 % rectal cream Place 1 application rectally 2 (two) times daily. 30 g 0   Ibuprofen 200 MG CAPS Take 400 mg by mouth daily as needed (pain).     isosorbide  mononitrate (IMDUR) 30 MG 24 hr tablet TAKE 1 TABLET BY MOUTH EVERY DAY 90 tablet 0   lidocaine (LMX) 4 % cream Apply 1 application topically 3 (three) times daily as needed. 30 g 0   lisinopril (ZESTRIL) 40 MG tablet Take 1 tablet (40 mg total) by mouth daily. 90 tablet 1   loperamide (IMODIUM) 2 MG capsule TAKE 1 CAPSULE (2 MG TOTAL) BY MOUTH AS NEEDED FOR DIARRHEA OR LOOSE STOOLS. 90 capsule 0   metoprolol tartrate (LOPRESSOR) 50 MG tablet TAKE 1 TABLET BY MOUTH TWICE A DAY 180 tablet 0   ondansetron (ZOFRAN ODT) 4 MG disintegrating tablet 75m ODT q4 hours prn nausea/vomit 20 tablet 0   ondansetron (ZOFRAN) 4 MG tablet Take 1 tablet (4 mg total) by mouth every 6 (six) hours as needed for nausea. 30 tablet 0   polycarbophil (FIBERCON) 625 MG tablet Take 1 tablet (625 mg total) by mouth daily. 30 tablet 0   No current  facility-administered medications for this visit.   Facility-Administered Medications Ordered in Other Visits  Medication Dose Route Frequency Provider Last Rate Last Admin   sodium chloride flush (NS) 0.9 % injection 10 mL  10 mL Intracatheter PRN FTruitt Merle MD   10 mL at 06/06/20 1051   sodium chloride flush (NS) 0.9 % injection 10 mL  10 mL Intracatheter PRN FTruitt Merle MD   10 mL at 12/12/20 1508    PHYSICAL EXAMINATION: ECOG PERFORMANCE STATUS: 1 - Symptomatic but completely ambulatory  Vitals:   12/12/20 1317  BP: (!) 127/52  Resp: 18  Temp: 97.9 F (36.6 C)  SpO2: 100%   Wt Readings from Last 3 Encounters:  12/12/20 232 lb 6.4 oz (105.4 kg)  12/01/20 231 lb 1.6 oz (104.8 kg)  11/19/20 231 lb 1.6 oz (104.8 kg)     GENERAL:alert, no distress and comfortable SKIN: skin color normal, no rashes or significant lesions except hyperpigmentation on her hands and feet  EYES: normal, Conjunctiva are pink and non-injected, sclera clear  NEURO: alert & oriented x 3 with fluent speech  LABORATORY DATA:  I have reviewed the data as listed CBC Latest Ref Rng & Units 12/12/2020 12/01/2020 11/19/2020  WBC 4.0 - 10.5 K/uL 7.3 8.6 6.6  Hemoglobin 12.0 - 15.0 g/dL 10.5(L) 11.9(L) 10.9(L)  Hematocrit 36.0 - 46.0 % 32.3(L) 36.6 33.1(L)  Platelets 150 - 400 K/uL 183 204 164     CMP Latest Ref Rng & Units 12/12/2020 12/01/2020 11/19/2020  Glucose 70 - 99 mg/dL 90 97 128(H)  BUN 6 - 20 mg/dL 26(H) 24(H) 21(H)  Creatinine 0.44 - 1.00 mg/dL 1.35(H) 0.87 1.16(H)  Sodium 135 - 145 mmol/L 137 134(L) 138  Potassium 3.5 - 5.1 mmol/L 4.6 3.5 3.7  Chloride 98 - 111 mmol/L 111 102 110  CO2 22 - 32 mmol/L 18(L) 21(L) 20(L)  Calcium 8.9 - 10.3 mg/dL 9.4 9.8 9.3  Total Protein 6.5 - 8.1 g/dL 7.5 7.8 7.6  Total Bilirubin 0.3 - 1.2 mg/dL 0.4 1.3(H) 0.5  Alkaline Phos 38 - 126 U/L 93 73 95  AST 15 - 41 U/L 15 18 15   ALT 0 - 44 U/L 10 11 11       RADIOGRAPHIC STUDIES: I have personally reviewed the  radiological images as listed and agreed with the findings in the report. No results found.    No orders of the defined types were placed in this encounter.  All questions were answered. The patient knows to call the clinic with  any problems, questions or concerns. No barriers to learning was detected. The total time spent in the appointment was 30 minutes.     Truitt Merle, MD 12/12/2020   I, Wilburn Mylar, am acting as scribe for Truitt Merle, MD.   I have reviewed the above documentation for accuracy and completeness, and I agree with the above.

## 2020-12-15 ENCOUNTER — Telehealth: Payer: Self-pay | Admitting: Hematology

## 2020-12-15 NOTE — Telephone Encounter (Signed)
Left message with follow-up appointments per 9/9 los.

## 2020-12-18 DIAGNOSIS — S31109A Unspecified open wound of abdominal wall, unspecified quadrant without penetration into peritoneal cavity, initial encounter: Secondary | ICD-10-CM | POA: Diagnosis not present

## 2020-12-18 DIAGNOSIS — K56609 Unspecified intestinal obstruction, unspecified as to partial versus complete obstruction: Secondary | ICD-10-CM | POA: Diagnosis not present

## 2020-12-18 DIAGNOSIS — T8131XD Disruption of external operation (surgical) wound, not elsewhere classified, subsequent encounter: Secondary | ICD-10-CM | POA: Diagnosis not present

## 2020-12-18 DIAGNOSIS — Z933 Colostomy status: Secondary | ICD-10-CM | POA: Diagnosis not present

## 2020-12-21 ENCOUNTER — Other Ambulatory Visit: Payer: Self-pay | Admitting: Internal Medicine

## 2020-12-23 ENCOUNTER — Other Ambulatory Visit (HOSPITAL_COMMUNITY): Payer: Self-pay

## 2020-12-31 ENCOUNTER — Other Ambulatory Visit (HOSPITAL_COMMUNITY): Payer: Self-pay

## 2021-01-01 ENCOUNTER — Other Ambulatory Visit: Payer: Self-pay | Admitting: Hematology

## 2021-01-01 DIAGNOSIS — C189 Malignant neoplasm of colon, unspecified: Secondary | ICD-10-CM

## 2021-01-01 DIAGNOSIS — C787 Secondary malignant neoplasm of liver and intrahepatic bile duct: Secondary | ICD-10-CM

## 2021-01-02 ENCOUNTER — Inpatient Hospital Stay: Payer: Medicaid Other

## 2021-01-02 ENCOUNTER — Other Ambulatory Visit (HOSPITAL_COMMUNITY): Payer: Self-pay

## 2021-01-02 ENCOUNTER — Other Ambulatory Visit: Payer: Self-pay

## 2021-01-02 ENCOUNTER — Inpatient Hospital Stay: Payer: Medicaid Other | Admitting: Hematology

## 2021-01-02 ENCOUNTER — Encounter: Payer: Self-pay | Admitting: Hematology

## 2021-01-02 VITALS — BP 132/54 | HR 50 | Temp 97.2°F | Resp 18 | Wt 232.6 lb

## 2021-01-02 DIAGNOSIS — C787 Secondary malignant neoplasm of liver and intrahepatic bile duct: Secondary | ICD-10-CM

## 2021-01-02 DIAGNOSIS — C189 Malignant neoplasm of colon, unspecified: Secondary | ICD-10-CM

## 2021-01-02 DIAGNOSIS — Z95828 Presence of other vascular implants and grafts: Secondary | ICD-10-CM

## 2021-01-02 DIAGNOSIS — Z5112 Encounter for antineoplastic immunotherapy: Secondary | ICD-10-CM | POA: Diagnosis not present

## 2021-01-02 LAB — COMPREHENSIVE METABOLIC PANEL
ALT: 8 U/L (ref 0–44)
AST: 18 U/L (ref 15–41)
Albumin: 3.5 g/dL (ref 3.5–5.0)
Alkaline Phosphatase: 98 U/L (ref 38–126)
Anion gap: 7 (ref 5–15)
BUN: 26 mg/dL — ABNORMAL HIGH (ref 6–20)
CO2: 19 mmol/L — ABNORMAL LOW (ref 22–32)
Calcium: 9.3 mg/dL (ref 8.9–10.3)
Chloride: 114 mmol/L — ABNORMAL HIGH (ref 98–111)
Creatinine, Ser: 1.16 mg/dL — ABNORMAL HIGH (ref 0.44–1.00)
GFR, Estimated: 54 mL/min — ABNORMAL LOW (ref >60)
Glucose, Bld: 52 mg/dL — ABNORMAL LOW (ref 70–99)
Potassium: 4.3 mmol/L (ref 3.5–5.1)
Sodium: 140 mmol/L (ref 135–145)
Total Bilirubin: 0.5 mg/dL (ref 0.3–1.2)
Total Protein: 7.5 g/dL (ref 6.5–8.1)

## 2021-01-02 LAB — CBC WITH DIFFERENTIAL/PLATELET
Abs Immature Granulocytes: 0.02 10*3/uL (ref 0.00–0.07)
Basophils Absolute: 0 10*3/uL (ref 0.0–0.1)
Basophils Relative: 1 %
Eosinophils Absolute: 0.2 10*3/uL (ref 0.0–0.5)
Eosinophils Relative: 4 %
HCT: 32 % — ABNORMAL LOW (ref 36.0–46.0)
Hemoglobin: 10.6 g/dL — ABNORMAL LOW (ref 12.0–15.0)
Immature Granulocytes: 0 %
Lymphocytes Relative: 33 %
Lymphs Abs: 2 10*3/uL (ref 0.7–4.0)
MCH: 35.2 pg — ABNORMAL HIGH (ref 26.0–34.0)
MCHC: 33.1 g/dL (ref 30.0–36.0)
MCV: 106.3 fL — ABNORMAL HIGH (ref 80.0–100.0)
Monocytes Absolute: 0.8 10*3/uL (ref 0.1–1.0)
Monocytes Relative: 13 %
Neutro Abs: 3 10*3/uL (ref 1.7–7.7)
Neutrophils Relative %: 49 %
Platelets: 184 10*3/uL (ref 150–400)
RBC: 3.01 MIL/uL — ABNORMAL LOW (ref 3.87–5.11)
RDW: 17.3 % — ABNORMAL HIGH (ref 11.5–15.5)
WBC: 6.1 10*3/uL (ref 4.0–10.5)
nRBC: 0 % (ref 0.0–0.2)

## 2021-01-02 LAB — CEA (IN HOUSE-CHCC): CEA (CHCC-In House): 189.28 ng/mL — ABNORMAL HIGH (ref 0.00–5.00)

## 2021-01-02 LAB — TOTAL PROTEIN, URINE DIPSTICK: Protein, ur: <30 mg/dL — AB

## 2021-01-02 MED ORDER — SODIUM CHLORIDE 0.9% FLUSH
10.0000 mL | INTRAVENOUS | Status: DC | PRN
  Administered 2021-01-02: 10 mL

## 2021-01-02 MED ORDER — CAPECITABINE 500 MG PO TABS
2000.0000 mg | ORAL_TABLET | Freq: Two times a day (BID) | ORAL | 2 refills | Status: DC
  Filled 2021-01-02: qty 112, 21d supply, fill #0
  Filled 2021-01-22: qty 112, 21d supply, fill #1
  Filled 2021-02-13: qty 112, 21d supply, fill #2

## 2021-01-02 MED ORDER — HEPARIN SOD (PORK) LOCK FLUSH 100 UNIT/ML IV SOLN
500.0000 [IU] | Freq: Once | INTRAVENOUS | Status: AC | PRN
  Administered 2021-01-02: 500 [IU]

## 2021-01-02 MED ORDER — SODIUM CHLORIDE 0.9 % IV SOLN: Freq: Once | INTRAVENOUS | Status: AC

## 2021-01-02 MED ORDER — SODIUM CHLORIDE 0.9 % IV SOLN
7.5000 mg/kg | Freq: Once | INTRAVENOUS | Status: AC
  Administered 2021-01-02: 800 mg via INTRAVENOUS
  Filled 2021-01-02: qty 32

## 2021-01-02 NOTE — Progress Notes (Signed)
Casey Black   Telephone:([phone]) [phone] Fax:(336) [phone]   Clinic Follow up Note   Patient Care Team: [name], [name], MD as PCP - General (Internal Medicine) Clovis Riley, MD as Consulting Physician (General Surgery) Truitt Merle, MD as Consulting Physician (Hematology) [name], NP as Nurse Practitioner (Nurse Practitioner)  Date of Service:  01/02/2021  CHIEF COMPLAINT: f/u of metastatic colon cancer  CURRENT THERAPY:  Maintenance Avastin and Xeloda $RemoveBe'2000mg'MmKoSeCmC$  q12h on day 1-14 every 3 weeks   ASSESSMENT & PLAN:  Casey Black is a 60 y.o. female with   1. 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 -PET from 02/26/19 shows known liver metastasis and metastatic lymphadenopathy in chest and right axilla. -[name] shows MSI stable disease, Kras+ No targetable mutation, she is not a candidate for EGFR or immunotherapy. -Began palliative first line chemo on 03/12/2019, received 5FU/leuc with first 2 cycles for large open abdominal wound after surgery. She started full dose FOLFOX and avastin with cycle 2. Due to neuropathy Oxaliplatin was stopped after 09/24/19, she has been on maintenance therapy. -For convenience she was switched to oral chemo Xeloda 2 weeks on/1 week off and Bev every 3 weeks in 01/21/2020.  Treatment held 05/01/2020 - 05/12/2020 due to COVID infection -restaging CT 11/17/20 showed stable liver mets, no new lesions or evidence of progression. We will plan for repeat in mid November. -She is clinically doing well. Labs reviewed, overall stable. Continue Xeloda and bevacizumab maintenance therapy. She is tolerating very well    2. Chemotherapy induced peripheral neuropathy (CIPN), G1 -secondary to Oxaliplatin,  developed after cycle 12 FOLFOX. Oxaliplatin d/c after C15 on 09/24/19 -Neuropathy in fingers and feet are moderate and stable with mild decreased function in fingers.  -She is currently on gabapentin 100 mg AM and 300 mg qHS and titrate up if needed.  -overall improved, no impact on her function now  -Will continue to monitor.    3. 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 Will give IV iron if needed  -Mild and stable anemia lately. Hgb 10.6 today (01/02/21)   4. HTN, uncontrolled  -on hydralazine, isosorbide, lisinopril, metoprolol, amlodipine and HCTZ  -Will monitor on bevacizumab.    5. Goals of care discussion, Social support -The patient understands the goal of care is palliative. She is full code now  -She worked in [employer] at 2 different jobs but has been on disability since her diagnosis.   9. COVID (+) 05/01/20 -She tested positive for COVID on 05/01/20. She received treatment infusion on 05/06/20. Symptoms overall resolved after treatment.  -Per patient she received 2 COVID vaccines. Will proceed with COVID booster      PLAN: -proceed with bevacizumab today, she started new cycle Xeloda this morning  -lab, flush, f/u, and beva in 3 and 6 weeks   No problem-specific Assessment & Plan notes found for this encounter.   SUMMARY OF ONCOLOGIC HISTORY: Oncology History Overview Note  Cancer Staging Adenocarcinoma of colon metastatic to liver [facility]) Staging form: Colon and Rectum, AJCC 8th Edition - Pathologic stage from 01/24/2019: Stage IVA (pT4a, pN1a, pM1a) - Signed by [name], NP on 02/14/2019    Adenocarcinoma of colon metastatic to  liver ([place])  01/23/2019 Imaging   ABD Xray IMPRESSION: 1. Bowel-gas pattern consistent with small bowel obstruction. No free air. 2. No acute chest findings.   01/23/2019 Imaging   CT AP IMPRESSION: Obstructing mid transverse colonic  mass with mild regional adenopathy and hepatic metastatic disease. The mass likely extends through the serosa; no ascites or peritoneal nodularity.   01/24/2019 Surgery   Surgeon: Clovis Riley MD Assistant: [name] Procedure performed: Transverse colectomy with end colostomy, liver biopsy Procedure classification: URGENT/EMERGENT Preop diagnosis: Obstructing, metastatic transverse colon mass Post-op diagnosis/intraop findings: Same   01/24/2019 Pathology Results   FINAL MICROSCOPIC DIAGNOSIS:   A. COLON, TRANSVERSE, RESECTION:  Colonic adenocarcinoma, 5 cm.  Carcinoma extends into pericolonic connective tissue and focally to  serosal surface.  Margins not involved.  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   B. LIVER NODULE, LEFT, BIOPSY:  Metastatic adenocarcinoma.    01/24/2019 Cancer Staging   Staging form: Colon and Rectum, AJCC 8th Edition - Pathologic stage from 01/24/2019: Stage IVA (pT4a, pN1a, pM1a) - Signed by [name], NP on 02/14/2019   02/02/2019 Initial Diagnosis   Adenocarcinoma of colon metastatic to liver ([place])   02/26/2019 PET scan   IMPRESSION: 1. Hypermetabolic metastatic disease in the liver and mediastinal/hilar/axillary lymph nodes. 2. Focal hypermetabolism in the rectum. Continued attention on follow-up exams is warranted. 3. Focal hypermetabolism medial to the right adrenal gland may be within a metastatic lymph node, better visualized on 01/23/2019. 4. Aortic atherosclerosis (ICD10-170.0). Coronary artery calcification.   03/12/2019 -  Chemotherapy   She started 5FU q2weeks on 03/12/19 for 2 cycles. She started full dose FOLFOX with Avastin on 04/09/19. Oxaliplatin dose reduced repeatedly due to neuropathy C12 and held since C16 on 10/09/19. Now on maintenance Avastin and 5FU q2weeks since 10/09/19       -Maintenance change to maintenance xeloda 2000 mg BID days 1-14 q21 days and q3 weeks Zirabev (15 mg/kg)  starting 01/21/20. First cycle was taken $RemoveB'1000mg'VZEbsJKd$  BID due to misunderstanding.    05/31/2019 Imaging   Restaging CT CAP IMPRESSION: 1. Similar to mild interval decrease in size of multiple hepatic lesions, partially calcified. 2. 2 mm right upper lobe pulmonary nodule. Recommend attention on follow-up. 3. Emphysema and aortic atherosclerosis.   08/23/2019 Imaging   CT CAP w contrast  IMPRESSION: 1. The dominant peripheral right liver metastasis has mildly increased. Other smaller liver metastases are stable. 2. Otherwise no new or progressive metastatic disease in the chest, abdomen or pelvis. 3. Aortic Atherosclerosis (ICD10-I70.0) and Emphysema (ICD10-J43.9).   11/27/2019 Imaging   CT CAP w contrast  IMPRESSION: Status post transverse colectomy with right mid abdominal colostomy.   Mildly progressive hepatic metastases, as above.   No evidence of metastatic disease in the chest. Small mediastinal lymph nodes are within normal limits.   Additional stable ancillary findings as above.   02/21/2020 Imaging   IMPRESSION: 1. Stable hepatic metastatic disease. 2. Aortic atherosclerosis (ICD10-I70.0). Coronary artery calcification. 3.  Emphysema (ICD10-J43.9).   05/19/2020 Imaging   CT CAP  IMPRESSION: 1. Multiple partially calcified liver metastases are again noted. With the exception of a small lesion in segment 7/8 lesions are not significantly changed in the interval. No new liver lesions identified. 2. Coronary artery atherosclerotic calcifications. 3. Aortic atherosclerosis. 4. 2 mm right upper lobe lung nodule identified.  Unchanged.   Aortic Atherosclerosis (ICD10-I70.0).   11/17/2020 Imaging   CT CAP  IMPRESSION: 1. Partially calcified lesions throughout  the liver are stable accounting for differences in technique, contrasted imaging on today's study is compared to noncontrast imaging on the prior. 2. No new hepatic lesions. 3. Tiny 3 mm pulmonary nodule in the  RIGHT upper lobe unchanged since the prior study. Attention on follow-up. 4. Mild fullness of RIGHT paratracheal nodal tissue is minimally increased and borderline enlarged, attention on follow-up. 5. RIGHT lower quadrant colostomy. 6. 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 7. Aortic atherosclerosis.      INTERVAL HISTORY:  Casey Black is here for a follow up of metastatic colon cancer. She was last seen by me on 12/12/20. She presents to the clinic alone. She reports she is feeling very well today. She reports she gets tired and will take naps.    All other systems were reviewed with the patient and are negative.  MEDICAL HISTORY:  Past Medical History:  Diagnosis Date   Anemia    low iron   Cancer (HCC)    colon cancer   Colon cancer ([place]) 01/2019   Hypertension 01/23/2019   Personal history of chemotherapy 01/2019   colon CA   SBO (small bowel obstruction) ([place]) 01/23/2019    SURGICAL HISTORY: Past Surgical History:  Procedure Laterality Date   CESAREAN SECTION     x2   COLOSTOMY N/A 01/24/2019   Procedure: End Loop Colostomy;  Surgeon: Clovis Riley, MD;  Location: [facility];  Service: General;  Laterality: N/A;   PARTIAL COLECTOMY N/A 01/24/2019   Procedure: PARTIAL COLECTOMY;  Surgeon: Clovis Riley, MD;  Location: [facility];  Service: General;  Laterality: N/A;   PORTACATH PLACEMENT Right 02/28/2019   Procedure: INSERTION PORT-A-CATH WITH ULTRASOUND GUIDANCE;  Surgeon: Clovis Riley, MD;  Location: [facility];  Service: General;  Laterality: Right;    I have reviewed the social history and family history with the patient and they are unchanged from previous note.  ALLERGIES:  has No Known Allergies.  MEDICATIONS:  Current Outpatient Medications  Medication Sig Dispense Refill   acetaminophen (TYLENOL) 325 MG tablet Take 2 tablets (650 mg  total) by mouth every 6 (six) hours as needed.     amLODipine (NORVASC) 10 MG tablet TAKE 1 TABLET BY MOUTH EVERY DAY 90 tablet 1   benzonatate (TESSALON) 100 MG capsule Take 1 capsule (100 mg total) by mouth every 8 (eight) hours. 21 capsule 0   capecitabine (XELODA) 500 MG tablet Take 4 tablets (2,000 mg total) by mouth 2 (two) times daily after a meal. For 14 days, then off for 7 days. 112 tablet 2   ferrous sulfate 325 (65 FE) MG tablet TAKE 1 TABLET (325 MG TOTAL) BY MOUTH 2 (TWO) TIMES DAILY WITH A MEAL. 180 tablet 1   gabapentin (NEURONTIN) 300 MG capsule Take 1 capsule (300 mg total) by mouth 2 (two) times daily. 60 capsule 3   hydrALAZINE (APRESOLINE) 25 MG tablet TAKE 1 TABLET BY MOUTH EVERY 8 HOURS 90 tablet 0   hydrochlorothiazide (HYDRODIURIL) 12.5 MG tablet Take 1 tablet (12.5 mg total) by mouth daily. 90 tablet 1   hydrocortisone (ANUSOL-HC) 2.5 % rectal cream Place 1 application rectally 2 (two) times daily. 30 g 0   Ibuprofen 200 MG CAPS Take 400 mg by mouth daily as needed (pain).     isosorbide mononitrate (IMDUR) 30 MG 24 hr tablet TAKE 1 TABLET BY MOUTH EVERY DAY  90 tablet 0   lidocaine (LMX) 4 % cream Apply 1 application topically 3 (three) times daily as needed. 30 g 0   lisinopril (ZESTRIL) 40 MG tablet Take 1 tablet (40 mg total) by mouth daily. 90 tablet 1   loperamide (IMODIUM) 2 MG capsule TAKE 1 CAPSULE (2 MG TOTAL) BY MOUTH AS NEEDED FOR DIARRHEA OR LOOSE STOOLS. 90 capsule 0   metoprolol tartrate (LOPRESSOR) 50 MG tablet TAKE 1 TABLET BY MOUTH TWICE A DAY 180 tablet 0   ondansetron (ZOFRAN ODT) 4 MG disintegrating tablet 4mg  ODT q4 hours prn nausea/vomit 20 tablet 0   ondansetron (ZOFRAN) 4 MG tablet Take 1 tablet (4 mg total) by mouth every 6 (six) hours as needed for nausea. 30 tablet 0   polycarbophil (FIBERCON) 625 MG tablet Take 1 tablet (625 mg total) by mouth daily. 30 tablet 0   No current facility-administered medications for this visit.    Facility-Administered Medications Ordered in Other Visits  Medication Dose Route Frequency Provider Last Rate Last Admin   sodium chloride flush (NS) 0.9 % injection 10 mL  10 mL Intracatheter PRN Truitt Merle, MD   10 mL at 06/06/20 1051    PHYSICAL EXAMINATION: ECOG PERFORMANCE STATUS: 1 - Symptomatic but completely ambulatory  Vitals:   01/02/21 1114  BP: (!) 132/54  Pulse: (!) 50  Resp: 18  Temp: (!) 97.2 F (36.2 C)  SpO2: 100%   Wt Readings from Last 3 Encounters:  01/02/21 232 lb 9 oz (105.5 kg)  12/12/20 232 lb 6.4 oz (105.4 kg)  12/01/20 231 lb 1.6 oz (104.8 kg)     GENERAL:alert, no distress and comfortable SKIN: skin color normal, no rashes or significant lesions EYES: normal, Conjunctiva are pink and non-injected, sclera clear  NEURO: alert & oriented x 3 with fluent speech  LABORATORY DATA:  I have reviewed the data as listed CBC Latest Ref Rng & Units 01/02/2021 12/12/2020 12/01/2020  WBC 4.0 - 10.5 K/uL 6.1 7.3 8.6  Hemoglobin 12.0 - 15.0 g/dL 10.6(L) 10.5(L) 11.9(L)  Hematocrit 36.0 - 46.0 % 32.0(L) 32.3(L) 36.6  Platelets 150 - 400 K/uL 184 183 204     CMP Latest Ref Rng & Units 01/02/2021 12/12/2020 12/01/2020  Glucose 70 - 99 mg/dL 52(L) 90 97  BUN 6 - 20 mg/dL 26(H) 26(H) 24(H)  Creatinine 0.44 - 1.00 mg/dL 1.16(H) 1.35(H) 0.87  Sodium 135 - 145 mmol/L 140 137 134(L)  Potassium 3.5 - 5.1 mmol/L 4.3 4.6 3.5  Chloride 98 - 111 mmol/L 114(H) 111 102  CO2 22 - 32 mmol/L 19(L) 18(L) 21(L)  Calcium 8.9 - 10.3 mg/dL 9.3 9.4 9.8  Total Protein 6.5 - 8.1 g/dL 7.5 7.5 7.8  Total Bilirubin 0.3 - 1.2 mg/dL 0.5 0.4 1.3(H)  Alkaline Phos 38 - 126 U/L 98 93 73  AST 15 - 41 U/L 18 15 18   ALT 0 - 44 U/L 8 10 11       RADIOGRAPHIC STUDIES: I have personally reviewed the radiological images as listed and agreed with the findings in the report. No results found.    No orders of the defined types were placed in this encounter.  All questions were answered. The  patient knows to call the clinic with any problems, questions or concerns. No barriers to learning was detected. The total time spent in the appointment was 30 minutes.     Truitt Merle, MD 01/02/2021   I, Wilburn Mylar, am acting as scribe for Truitt Merle, MD.  I have reviewed the above documentation for accuracy and completeness, and I agree with the above.

## 2021-01-02 NOTE — Patient Instructions (Signed)
[facility] MEDICAL ONCOLOGY  Discharge Instructions: °Thank you for choosing [facility] to provide your oncology and hematology care.  ° °If you have a lab appointment with the Cancer Center, please go directly to the Cancer Center and check in at the registration area. °  °Wear comfortable clothing and clothing appropriate for easy access to any Portacath or PICC line.  ° °We strive to give you quality time with your provider. You may need to reschedule your appointment if you arrive late (15 or more minutes).  Arriving late affects you and other patients whose appointments are after yours.  Also, if you miss three or more appointments without notifying the office, you may be dismissed from the clinic at the provider’s discretion.    °  °For prescription refill requests, have your pharmacy contact our office and allow 72 hours for refills to be completed.   ° °Today you received the following chemotherapy and/or immunotherapy agents: bevacizumab    °  °To help prevent nausea and vomiting after your treatment, we encourage you to take your nausea medication as directed. ° °BELOW ARE SYMPTOMS THAT SHOULD BE REPORTED IMMEDIATELY: °*FEVER GREATER THAN 100.4 F (38 °C) OR HIGHER °*CHILLS OR SWEATING °*NAUSEA AND VOMITING THAT IS NOT CONTROLLED WITH YOUR NAUSEA MEDICATION °*UNUSUAL SHORTNESS OF BREATH °*UNUSUAL BRUISING OR BLEEDING °*URINARY PROBLEMS (pain or burning when urinating, or frequent urination) °*BOWEL PROBLEMS (unusual diarrhea, constipation, pain near the anus) °TENDERNESS IN MOUTH AND THROAT WITH OR WITHOUT PRESENCE OF ULCERS (sore throat, sores in mouth, or a toothache) °UNUSUAL RASH, SWELLING OR PAIN  °UNUSUAL VAGINAL DISCHARGE OR ITCHING  ° °Items with * indicate a potential emergency and should be followed up as soon as possible or go to the Emergency Department if any problems should occur. ° °Please show the CHEMOTHERAPY ALERT CARD or IMMUNOTHERAPY ALERT CARD at check-in  to the Emergency Department and triage nurse. ° °Should you have questions after your visit or need to cancel or reschedule your appointment, please contact [facility]COL[facility]  Dept: [phone]  and follow the prompts.  Office hours are 8:00 a.m. to 4:30 p.m. Monday - Friday. Please note that voicemails left after 4:00 p.m. may not be returned until the following business day.  We are closed weekends and major holidays. You have access to a nurse at all times for urgent questions. Please call the main number to the clinic Dept: [phone] and follow the prompts. ° ° °For any non-urgent questions, you may also contact your provider using MyChart. We now offer e-Visits for anyone 18 and older to request care online for non-urgent symptoms. For details visit [email or web address]. °  °Also download the MyChart app! Go to the app store, search "MyChart", open the app, select [facility], and log in with your MyChart username and password. ° °Due to Covid, a mask is required upon entering the hospital/clinic. If you do not have a mask, one will be given to you upon arrival. For doctor visits, patients may have 1 support person aged 18 or older with them. For treatment visits, patients cannot have anyone with them due to current Covid guidelines and our immunocompromised population.  ° °

## 2021-01-14 ENCOUNTER — Other Ambulatory Visit: Payer: Self-pay | Admitting: Internal Medicine

## 2021-01-15 ENCOUNTER — Other Ambulatory Visit: Payer: Self-pay | Admitting: Internal Medicine

## 2021-01-15 ENCOUNTER — Other Ambulatory Visit: Payer: Self-pay | Admitting: Hematology

## 2021-01-20 DIAGNOSIS — T8131XD Disruption of external operation (surgical) wound, not elsewhere classified, subsequent encounter: Secondary | ICD-10-CM | POA: Diagnosis not present

## 2021-01-20 DIAGNOSIS — Z933 Colostomy status: Secondary | ICD-10-CM | POA: Diagnosis not present

## 2021-01-20 DIAGNOSIS — S31109A Unspecified open wound of abdominal wall, unspecified quadrant without penetration into peritoneal cavity, initial encounter: Secondary | ICD-10-CM | POA: Diagnosis not present

## 2021-01-20 DIAGNOSIS — K56609 Unspecified intestinal obstruction, unspecified as to partial versus complete obstruction: Secondary | ICD-10-CM | POA: Diagnosis not present

## 2021-01-22 ENCOUNTER — Other Ambulatory Visit (HOSPITAL_COMMUNITY): Payer: Self-pay

## 2021-01-23 ENCOUNTER — Inpatient Hospital Stay: Payer: Medicaid Other | Attending: Hematology

## 2021-01-23 ENCOUNTER — Inpatient Hospital Stay: Payer: Medicaid Other

## 2021-01-23 ENCOUNTER — Other Ambulatory Visit: Payer: Self-pay

## 2021-01-23 ENCOUNTER — Inpatient Hospital Stay: Payer: Medicaid Other | Admitting: Hematology

## 2021-01-23 VITALS — BP 150/74 | HR 62 | Temp 97.7°F | Resp 17 | Wt 236.3 lb

## 2021-01-23 VITALS — BP 128/56

## 2021-01-23 DIAGNOSIS — C778 Secondary and unspecified malignant neoplasm of lymph nodes of multiple regions: Secondary | ICD-10-CM | POA: Diagnosis not present

## 2021-01-23 DIAGNOSIS — C184 Malignant neoplasm of transverse colon: Secondary | ICD-10-CM | POA: Diagnosis present

## 2021-01-23 DIAGNOSIS — Z95828 Presence of other vascular implants and grafts: Secondary | ICD-10-CM

## 2021-01-23 DIAGNOSIS — C189 Malignant neoplasm of colon, unspecified: Secondary | ICD-10-CM | POA: Diagnosis not present

## 2021-01-23 DIAGNOSIS — Z5112 Encounter for antineoplastic immunotherapy: Secondary | ICD-10-CM | POA: Insufficient documentation

## 2021-01-23 DIAGNOSIS — C787 Secondary malignant neoplasm of liver and intrahepatic bile duct: Secondary | ICD-10-CM | POA: Insufficient documentation

## 2021-01-23 LAB — CMP (CANCER CENTER ONLY)
ALT: 11 U/L (ref 0–44)
AST: 18 U/L (ref 15–41)
Albumin: 3.7 g/dL (ref 3.5–5.0)
Alkaline Phosphatase: 82 U/L (ref 38–126)
Anion gap: 5 (ref 5–15)
BUN: 33 mg/dL — ABNORMAL HIGH (ref 6–20)
CO2: 19 mmol/L — ABNORMAL LOW (ref 22–32)
Calcium: 9.3 mg/dL (ref 8.9–10.3)
Chloride: 114 mmol/L — ABNORMAL HIGH (ref 98–111)
Creatinine: 1.06 mg/dL — ABNORMAL HIGH (ref 0.44–1.00)
GFR, Estimated: >60 mL/min (ref >60)
Glucose, Bld: 109 mg/dL — ABNORMAL HIGH (ref 70–99)
Potassium: 4.4 mmol/L (ref 3.5–5.1)
Sodium: 138 mmol/L (ref 135–145)
Total Bilirubin: 0.3 mg/dL (ref 0.3–1.2)
Total Protein: 7.6 g/dL (ref 6.5–8.1)

## 2021-01-23 LAB — CBC WITH DIFFERENTIAL/PLATELET
Abs Immature Granulocytes: 0.02 10*3/uL (ref 0.00–0.07)
Basophils Absolute: 0 10*3/uL (ref 0.0–0.1)
Basophils Relative: 0 %
Eosinophils Absolute: 0.2 10*3/uL (ref 0.0–0.5)
Eosinophils Relative: 4 %
HCT: 31.4 % — ABNORMAL LOW (ref 36.0–46.0)
Hemoglobin: 10.1 g/dL — ABNORMAL LOW (ref 12.0–15.0)
Immature Granulocytes: 0 %
Lymphocytes Relative: 38 %
Lymphs Abs: 2.2 10*3/uL (ref 0.7–4.0)
MCH: 34.1 pg — ABNORMAL HIGH (ref 26.0–34.0)
MCHC: 32.2 g/dL (ref 30.0–36.0)
MCV: 106.1 fL — ABNORMAL HIGH (ref 80.0–100.0)
Monocytes Absolute: 0.7 10*3/uL (ref 0.1–1.0)
Monocytes Relative: 12 %
Neutro Abs: 2.6 10*3/uL (ref 1.7–7.7)
Neutrophils Relative %: 46 %
Platelets: 131 10*3/uL — ABNORMAL LOW (ref 150–400)
RBC: 2.96 MIL/uL — ABNORMAL LOW (ref 3.87–5.11)
RDW: 17.4 % — ABNORMAL HIGH (ref 11.5–15.5)
WBC: 5.7 10*3/uL (ref 4.0–10.5)
nRBC: 0 % (ref 0.0–0.2)

## 2021-01-23 LAB — TOTAL PROTEIN, URINE DIPSTICK: Protein, ur: NEGATIVE mg/dL

## 2021-01-23 LAB — CEA (IN HOUSE-CHCC): CEA (CHCC-In House): 202.12 ng/mL — ABNORMAL HIGH (ref 0.00–5.00)

## 2021-01-23 MED ORDER — SODIUM CHLORIDE 0.9% FLUSH
10.0000 mL | INTRAVENOUS | Status: DC | PRN
  Administered 2021-01-23: 10 mL

## 2021-01-23 MED ORDER — HEPARIN SOD (PORK) LOCK FLUSH 100 UNIT/ML IV SOLN
500.0000 [IU] | Freq: Once | INTRAVENOUS | Status: AC | PRN
  Administered 2021-01-23: 500 [IU]

## 2021-01-23 MED ORDER — SODIUM CHLORIDE 0.9 % IV SOLN: Freq: Once | INTRAVENOUS | Status: AC

## 2021-01-23 MED ORDER — SODIUM CHLORIDE 0.9 % IV SOLN
7.5000 mg/kg | Freq: Once | INTRAVENOUS | Status: AC
  Administered 2021-01-23: 800 mg via INTRAVENOUS
  Filled 2021-01-23: qty 32

## 2021-01-23 NOTE — Patient Instructions (Signed)
[facility] MEDICAL [facility]COLOG[facility]  Discharge Instructions: °Thank you for choosing [facility] to provide your oncology and hematology care.  ° °If you have a lab appointment with the Cancer Center, please go directly to the Cancer Center and check in at the registration area. °  °Wear comfortable clothing and clothing appropriate for easy access to any Portacath or PICC line.  ° °We strive to give you quality time with your provider. You may need to reschedule your appointment if you arrive late (15 or more minutes).  Arriving late affects you and other patients whose appointments are after yours.  Also, if you miss three or more appointments without notifying the office, you may be dismissed from the clinic at the provider’s discretion.    °  °For prescription refill requests, have your pharmacy contact our office and allow 72 hours for refills to be completed.   ° °Today you received the following chemotherapy and/or immunotherapy agents: Bevacizumab.     °  °To help prevent nausea and vomiting after your treatment, we encourage you to take your nausea medication as directed. ° °BELOW ARE SYMPTOMS THAT SHOULD BE REPORTED IMMEDIATELY: °*FEVER GREATER THAN 100.4 F (38 °C) OR HIGHER °*CHILLS OR SWEATING °*NAUSEA AND VOMITING THAT IS NOT CONTROLLED WITH YOUR NAUSEA MEDICATION °*UNUSUAL SHORTNESS OF BREATH °*UNUSUAL BRUISING OR BLEEDING °*URINARY PROBLEMS (pain or burning when urinating, or frequent urination) °*BOWEL PROBLEMS (unusual diarrhea, constipation, pain near the anus) °TENDERNESS IN MOUTH AND THROAT WITH OR WITHOUT PRESENCE OF ULCERS (sore throat, sores in mouth, or a toothache) °UNUSUAL RASH, SWELLING OR PAIN  °UNUSUAL VAGINAL DISCHARGE OR ITCHING  ° °Items with * indicate a potential emergency and should be followed up as soon as possible or go to the Emergency Department if any problems should occur. ° °Please show the CHEMOTHERAPY ALERT CARD or IMMUNOTHERAPY ALERT CARD at check-in  to the Emergency Department and triage nurse. ° °Should you have questions after your visit or need to cancel or reschedule your appointment, please contact [facility]  Dept: [phone]  and follow the prompts.  Office hours are 8:00 a.m. to 4:30 p.m. Monday - Friday. Please note that voicemails left after 4:00 p.m. may not be returned until the following business day.  We are closed weekends and major holidays. You have access to a nurse at all times for urgent questions. Please call the main number to the clinic Dept: [phone] and follow the prompts. ° ° °For any non-urgent questions, you may also contact your provider using MyChart. We now offer e-Visits for anyone 18 and older to request care online for non-urgent symptoms. For details visit [email or web address]. °  °Also download the MyChart app! Go to the app store, search "MyChart", open the app, select [facility], and log in with your MyChart username and password. ° °Due to Covid, a mask is required upon entering the hospital/clinic. If you do not have a mask, one will be given to you upon arrival. For doctor visits, patients may have 1 support person aged 18 or older with them. For treatment visits, patients cannot have anyone with them due to current Covid guidelines and our immunocompromised population.  ° °

## 2021-01-23 NOTE — Progress Notes (Signed)
[place]   [phone] [phone]   Clinic Follow up Note   Patient Care Team: [name], [name], MD as PCP - General (Internal Medicine) Clovis Riley, MD as Consulting Physician (General Surgery) Truitt Merle, MD as Consulting Physician (Hematology) [name], NP as Nurse Practitioner (Nurse Practitioner)  Date of Service:  01/23/2021  CHIEF COMPLAINT: f/u of metastatic colon cancer  CURRENT THERAPY:  Maintenance Avastin and Xeloda 2030m q12h on day 1-14 every 3 weeks   ASSESSMENT & PLAN:  Casey Mellottis a 60y.o. female with   1. 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 -PET from 02/26/19 shows known liver metastasis and metastatic lymphadenopathy in chest and right axilla. -[name] shows MSI stable disease, Kras+ No targetable mutation, she is not a candidate for EGFR or immunotherapy. -Began palliative first line chemo on 03/12/2019, received 5FU/leuc with first 2 cycles for large open abdominal wound after surgery. She started full dose FOLFOX and avastin with cycle 2. Due to neuropathy Oxaliplatin was stopped after 09/24/19, she has been on maintenance therapy. -For convenience she was switched to oral chemo Xeloda 2 weeks on/1 week off and Bev every 3 weeks in 01/21/2020.  Treatment held 05/01/2020 - 05/12/2020 due to COVID infection -restaging CT 11/17/20 showed stable liver mets, no new lesions or evidence of progression. We will plan for repeat in mid November. -She is clinically doing well. Labs reviewed, overall stable. Continue Xeloda and bevacizumab maintenance therapy. She is tolerating very well    2. Chemotherapy induced peripheral neuropathy (CIPN), G1 -secondary to Oxaliplatin,  developed after cycle 12 FOLFOX. Oxaliplatin d/c after C15 on 09/24/19 -Neuropathy in fingers and feet are moderate and stable with mild decreased function in fingers.  -She is currently on gabapentin 100 mg AM and 300 mg qHS and titrate up if needed.  -overall improved, no impact on her function now  -Will continue to monitor.    3. 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 Will give IV iron if needed  -Mild and stable anemia lately. Hgb 10.1 today (01/23/21)   4. HTN, uncontrolled  -on hydralazine, isosorbide, lisinopril, metoprolol, amlodipine and HCTZ  -Will monitor on bevacizumab.    5. Goals of care discussion, Social support -The patient understands the goal of care is palliative. She is full code now  -She worked in [employer] 2 different jobs but has been on disability since her diagnosis.   9. COVID (+) 05/01/20 -She tested positive for COVID on 05/01/20. She received treatment infusion on 05/06/20. Symptoms overall resolved after treatment.  -Per patient she received 2 COVID vaccines. Will proceed with COVID booster      PLAN: -proceed with bevacizumab today, she will start new cycle Xeloda tomorrow at same dose  -lab and restaging CT in 2-3 weeks -flush, f/u, and beva in 3 and 6 weeks   No problem-specific Assessment & Plan notes found for this encounter.   SUMMARY OF ONCOLOGIC HISTORY: Oncology History Overview Note  Cancer Staging Adenocarcinoma of colon metastatic to liver ([facility] Staging form: Colon and Rectum, AJCC 8th Edition - Pathologic stage from 01/24/2019: Stage IVA (pT4a, pN1a, pM1a) - Signed by BAlla Feeling NP on  02/14/2019    Adenocarcinoma of colon metastatic to liver ([place])  01/23/2019 Imaging   ABD Xray IMPRESSION: 1. Bowel-gas pattern consistent with small bowel obstruction. No free air. 2. No acute chest findings.   01/23/2019 Imaging   CT AP  IMPRESSION: Obstructing mid transverse colonic mass with mild regional adenopathy and hepatic metastatic disease. The mass likely extends through the serosa; no ascites or peritoneal nodularity.   01/24/2019 Surgery   Surgeon: Clovis Riley MD Assistant: [name] Procedure performed: Transverse colectomy with end colostomy, liver biopsy Procedure classification: URGENT/EMERGENT Preop diagnosis: Obstructing, metastatic transverse colon mass Post-op diagnosis/intraop findings: Same   01/24/2019 Pathology Results   FINAL MICROSCOPIC DIAGNOSIS:   A. COLON, TRANSVERSE, RESECTION:  Colonic adenocarcinoma, 5 cm.  Carcinoma extends into pericolonic connective tissue and focally to  serosal surface.  Margins not involved.  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   B. LIVER NODULE, LEFT, BIOPSY:  Metastatic adenocarcinoma.    01/24/2019 Cancer Staging   Staging form: Colon and Rectum, AJCC 8th Edition - Pathologic stage from 01/24/2019: Stage IVA (pT4a, pN1a, pM1a) - Signed by [name], NP on 02/14/2019    02/02/2019 Initial Diagnosis   Adenocarcinoma of colon metastatic to liver ([place])   02/26/2019 PET scan   IMPRESSION: 1. Hypermetabolic metastatic disease in the liver and mediastinal/hilar/axillary lymph nodes. 2. Focal hypermetabolism in the rectum. Continued attention on follow-up exams is warranted. 3. Focal hypermetabolism medial to the right adrenal gland may be within a metastatic lymph node, better visualized on 01/23/2019. 4. Aortic atherosclerosis (ICD10-170.0). Coronary artery calcification.   03/12/2019 -  Chemotherapy   She started 5FU q2weeks on 03/12/19 for 2 cycles. She started full dose FOLFOX with Avastin on 04/09/19. Oxaliplatin dose reduced repeatedly due to neuropathy C12 and held since C16 on 10/09/19. Now on maintenance Avastin and 5FU q2weeks since 10/09/19       -Maintenance change to maintenance xeloda 2000 mg BID days  1-14 q21 days and q3 weeks Zirabev (15 mg/kg) starting 01/21/20. First cycle was taken 1042m BID due to misunderstanding.    05/31/2019 Imaging   Restaging CT CAP IMPRESSION: 1. Similar to mild interval decrease in size of multiple hepatic lesions, partially calcified. 2. 2 mm right upper lobe pulmonary nodule. Recommend attention on follow-up. 3. Emphysema and aortic atherosclerosis.   08/23/2019 Imaging   CT CAP w contrast  IMPRESSION: 1. The dominant peripheral right liver metastasis has mildly increased. Other smaller liver metastases are stable. 2. Otherwise no new or progressive metastatic disease in the chest, abdomen or pelvis. 3. Aortic Atherosclerosis (ICD10-I70.0) and Emphysema (ICD10-J43.9).   11/27/2019 Imaging   CT CAP w contrast  IMPRESSION: Status post transverse colectomy with right mid abdominal colostomy.   Mildly progressive hepatic metastases, as above.   No evidence of metastatic disease in the chest. Small mediastinal lymph nodes are within normal limits.   Additional stable ancillary findings as above.   02/21/2020 Imaging   IMPRESSION: 1. Stable hepatic metastatic disease. 2. Aortic atherosclerosis (ICD10-I70.0). Coronary artery calcification. 3.  Emphysema (ICD10-J43.9).   05/19/2020 Imaging   CT CAP  IMPRESSION: 1. Multiple partially calcified liver metastases are again noted. With the exception of a small lesion in segment 7/8 lesions are not significantly changed in the interval. No new liver lesions identified. 2. Coronary artery atherosclerotic calcifications. 3. Aortic atherosclerosis. 4. 2 mm right upper lobe lung nodule identified.  Unchanged.   Aortic Atherosclerosis (ICD10-I70.0).   11/17/2020 Imaging  CT CAP  IMPRESSION: 1. Partially calcified lesions throughout the liver are stable accounting for differences in technique, contrasted imaging on today's study is compared to noncontrast imaging on the prior. 2. No new hepatic  lesions. 3. Tiny 3 mm pulmonary nodule in the RIGHT upper lobe unchanged since the prior study. Attention on follow-up. 4. Mild fullness of RIGHT paratracheal nodal tissue is minimally increased and borderline enlarged, attention on follow-up. 5. RIGHT lower quadrant colostomy. 6. 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 7. Aortic atherosclerosis.      INTERVAL HISTORY:  Jerusha Reising is here for a follow up of metastatic colon cancer. She was last seen by me on 01/02/21. She presents to the clinic alone. She reports she is feeling really good today. She notes she is now 2 years out from diagnosis, and she is thrilled with how well she is doing.   All other systems were reviewed with the patient and are negative.  MEDICAL HISTORY:  Past Medical History:  Diagnosis Date   Anemia    low iron   Cancer (HCC)    colon cancer   Colon cancer ([place]) 01/2019   Hypertension 01/23/2019   Personal history of chemotherapy 01/2019   colon CA   SBO (small bowel obstruction) ([place]) 01/23/2019    SURGICAL HISTORY: Past Surgical History:  Procedure Laterality Date   CESAREAN SECTION     x2   COLOSTOMY N/A 01/24/2019   Procedure: End Loop Colostomy;  Surgeon: Clovis Riley, MD;  Location: [facility];  Service: General;  Laterality: N/A;   PARTIAL COLECTOMY N/A 01/24/2019   Procedure: PARTIAL COLECTOMY;  Surgeon: Clovis Riley, MD;  Location: [facility];  Service: General;  Laterality: N/A;   PORTACATH PLACEMENT Right 02/28/2019   Procedure: INSERTION PORT-A-CATH WITH ULTRASOUND GUIDANCE;  Surgeon: Clovis Riley, MD;  Location: [facility];  Service: General;  Laterality: Right;    I have reviewed the social history and family history with the patient and they are unchanged from previous note.  ALLERGIES:  has No Known Allergies.  MEDICATIONS:  Current Outpatient  Medications  Medication Sig Dispense Refill   acetaminophen (TYLENOL) 325 MG tablet Take 2 tablets (650 mg total) by mouth every 6 (six) hours as needed.     amLODipine (NORVASC) 10 MG tablet TAKE 1 TABLET BY MOUTH EVERY DAY 90 tablet 1   benzonatate (TESSALON) 100 MG capsule Take 1 capsule (100 mg total) by mouth every 8 (eight) hours. 21 capsule 0   capecitabine (XELODA) 500 MG tablet Take 4 tablets (2,000 mg total) by mouth 2 (two) times daily after a meal. For 14 days, then off for 7 days. 112 tablet 2   ferrous sulfate 325 (65 FE) MG tablet TAKE 1 TABLET (325 MG TOTAL) BY MOUTH 2 (TWO) TIMES DAILY WITH A MEAL. 180 tablet 1   gabapentin (NEURONTIN) 300 MG capsule TAKE 1 CAPSULE BY MOUTH TWICE A DAY 60 capsule 3   hydrALAZINE (APRESOLINE) 25 MG tablet TAKE 1 TABLET BY MOUTH EVERY 8 HOURS 90 tablet 0   hydrochlorothiazide (HYDRODIURIL) 12.5 MG tablet Take 1 tablet (12.5 mg total) by mouth daily. 90 tablet 1   hydrocortisone (ANUSOL-HC) 2.5 % rectal cream Place 1 application rectally 2 (two) times daily. 30 g 0   Ibuprofen 200 MG CAPS Take 400 mg by mouth daily as needed (pain).  isosorbide mononitrate (IMDUR) 30 MG 24 hr tablet TAKE 1 TABLET BY MOUTH EVERY DAY 30 tablet 0   lidocaine (LMX) 4 % cream Apply 1 application topically 3 (three) times daily as needed. 30 g 0   lisinopril (ZESTRIL) 40 MG tablet Take 1 tablet (40 mg total) by mouth daily. 90 tablet 1   loperamide (IMODIUM) 2 MG capsule TAKE 1 CAPSULE (2 MG TOTAL) BY MOUTH AS NEEDED FOR DIARRHEA OR LOOSE STOOLS. 90 capsule 0   metoprolol tartrate (LOPRESSOR) 50 MG tablet TAKE 1 TABLET BY MOUTH TWICE A DAY 60 tablet 0   ondansetron (ZOFRAN ODT) 4 MG disintegrating tablet 65m ODT q4 hours prn nausea/vomit 20 tablet 0   ondansetron (ZOFRAN) 4 MG tablet Take 1 tablet (4 mg total) by mouth every 6 (six) hours as needed for nausea. 30 tablet 0   polycarbophil (FIBERCON) 625 MG tablet Take 1 tablet (625 mg total) by mouth daily. 30 tablet 0    No current facility-administered medications for this visit.   Facility-Administered Medications Ordered in Other Visits  Medication Dose Route Frequency Provider Last Rate Last Admin   sodium chloride flush (NS) 0.9 % injection 10 mL  10 mL Intracatheter PRN FTruitt Merle MD   10 mL at 06/06/20 1051   sodium chloride flush (NS) 0.9 % injection 10 mL  10 mL Intracatheter PRN FTruitt Merle MD   10 mL at 01/23/21 1304    PHYSICAL EXAMINATION: ECOG PERFORMANCE STATUS: 1 - Symptomatic but completely ambulatory  There were no vitals filed for this visit. Wt Readings from Last 3 Encounters:  01/02/21 232 lb 9 oz (105.5 kg)  12/12/20 232 lb 6.4 oz (105.4 kg)  12/01/20 231 lb 1.6 oz (104.8 kg)     GENERAL:alert, no distress and comfortable SKIN: skin color normal, no rashes or significant lesions EYES: normal, Conjunctiva are pink and non-injected, sclera clear  NEURO: alert & oriented x 3 with fluent speech  LABORATORY DATA:  I have reviewed the data as listed CBC Latest Ref Rng & Units 01/02/2021 12/12/2020 12/01/2020  WBC 4.0 - 10.5 K/uL 6.1 7.3 8.6  Hemoglobin 12.0 - 15.0 g/dL 10.6(L) 10.5(L) 11.9(L)  Hematocrit 36.0 - 46.0 % 32.0(L) 32.3(L) 36.6  Platelets 150 - 400 K/uL 184 183 204     CMP Latest Ref Rng & Units 01/02/2021 12/12/2020 12/01/2020  Glucose 70 - 99 mg/dL 52(L) 90 97  BUN 6 - 20 mg/dL 26(H) 26(H) 24(H)  Creatinine 0.44 - 1.00 mg/dL 1.16(H) 1.35(H) 0.87  Sodium 135 - 145 mmol/L 140 137 134(L)  Potassium 3.5 - 5.1 mmol/L 4.3 4.6 3.5  Chloride 98 - 111 mmol/L 114(H) 111 102  CO2 22 - 32 mmol/L 19(L) 18(L) 21(L)  Calcium 8.9 - 10.3 mg/dL 9.3 9.4 9.8  Total Protein 6.5 - 8.1 g/dL 7.5 7.5 7.8  Total Bilirubin 0.3 - 1.2 mg/dL 0.5 0.4 1.3(H)  Alkaline Phos 38 - 126 U/L 98 93 73  AST 15 - 41 U/L _0 ALT 0 - 44 U/L _1 RADIOGRAPHIC STUDIES: I have personally reviewed the radiological images as listed and agreed with the findings in the report. No results  found.    Orders Placed This Encounter  Procedures   CT CHEST ABDOMEN PELVIS W CONTRAST    Standing Status:   Future    Standing Expiration Date:   01/23/2022    Order Specific Question:   Is patient pregnant?    Answer:  No    Order Specific Question:   Preferred imaging location?    Answer:   [facility]    Order Specific Question:   Release to patient    Answer:   Immediate    Order Specific Question:   Is Oral Contrast requested for this exam?    Answer:   Yes, Per Radiology protocol   All questions were answered. The patient knows to call the clinic with any problems, questions or concerns. No barriers to learning was detected. The total time spent in the appointment was 30 minutes.     Truitt Merle, MD 01/23/2021   I, Wilburn Mylar, am acting as scribe for Truitt Merle, MD.   I have reviewed the above documentation for accuracy and completeness, and I agree with the above.

## 2021-01-25 ENCOUNTER — Encounter: Payer: Self-pay | Admitting: Hematology

## 2021-01-26 ENCOUNTER — Telehealth: Payer: Self-pay | Admitting: Hematology

## 2021-01-26 NOTE — Telephone Encounter (Signed)
Scheduled per 10/21 los, attempted to call, was unable to leave message. Will mail calender

## 2021-01-29 ENCOUNTER — Other Ambulatory Visit (HOSPITAL_COMMUNITY): Payer: Self-pay

## 2021-02-09 ENCOUNTER — Other Ambulatory Visit (HOSPITAL_COMMUNITY): Payer: Self-pay

## 2021-02-09 ENCOUNTER — Other Ambulatory Visit: Payer: Self-pay | Admitting: Internal Medicine

## 2021-02-13 ENCOUNTER — Encounter (HOSPITAL_COMMUNITY): Payer: Self-pay

## 2021-02-13 ENCOUNTER — Other Ambulatory Visit: Payer: Self-pay

## 2021-02-13 ENCOUNTER — Ambulatory Visit (HOSPITAL_COMMUNITY)
Admission: RE | Admit: 2021-02-13 | Discharge: 2021-02-13 | Disposition: A | Discharge status: Home / Self Care | Payer: Medicaid Other | Source: Ambulatory Visit | Attending: Hematology | Admitting: Hematology

## 2021-02-13 ENCOUNTER — Other Ambulatory Visit (HOSPITAL_COMMUNITY): Payer: Self-pay

## 2021-02-13 DIAGNOSIS — I517 Cardiomegaly: Secondary | ICD-10-CM | POA: Diagnosis not present

## 2021-02-13 DIAGNOSIS — I2510 Atherosclerotic heart disease of native coronary artery without angina pectoris: Secondary | ICD-10-CM | POA: Diagnosis not present

## 2021-02-13 DIAGNOSIS — C787 Secondary malignant neoplasm of liver and intrahepatic bile duct: Secondary | ICD-10-CM | POA: Insufficient documentation

## 2021-02-13 DIAGNOSIS — K769 Liver disease, unspecified: Secondary | ICD-10-CM | POA: Diagnosis not present

## 2021-02-13 DIAGNOSIS — C189 Malignant neoplasm of colon, unspecified: Secondary | ICD-10-CM | POA: Insufficient documentation

## 2021-02-13 DIAGNOSIS — I700 Atherosclerosis of aorta: Secondary | ICD-10-CM | POA: Diagnosis not present

## 2021-02-13 MED ORDER — HEPARIN SOD (PORK) LOCK FLUSH 100 UNIT/ML IV SOLN
INTRAVENOUS | Status: AC
  Filled 2021-02-13: qty 5

## 2021-02-13 MED ORDER — HEPARIN SOD (PORK) LOCK FLUSH 100 UNIT/ML IV SOLN
500.0000 [IU] | Freq: Once | INTRAVENOUS | Status: AC
  Administered 2021-02-13: 500 [IU] via INTRAVENOUS

## 2021-02-13 MED ORDER — IOHEXOL 350 MG/ML SOLN
75.0000 mL | Freq: Once | INTRAVENOUS | Status: AC | PRN
  Administered 2021-02-13: 75 mL via INTRAVENOUS

## 2021-02-16 ENCOUNTER — Other Ambulatory Visit: Payer: Self-pay

## 2021-02-16 ENCOUNTER — Other Ambulatory Visit (HOSPITAL_COMMUNITY): Payer: Self-pay

## 2021-02-16 ENCOUNTER — Inpatient Hospital Stay: Payer: Medicaid Other | Attending: Hematology

## 2021-02-16 ENCOUNTER — Inpatient Hospital Stay: Payer: Medicaid Other | Admitting: Hematology

## 2021-02-16 ENCOUNTER — Inpatient Hospital Stay: Payer: Medicaid Other

## 2021-02-16 VITALS — BP 145/56 | HR 47 | Temp 98.6°F | Resp 17 | Wt 236.6 lb

## 2021-02-16 VITALS — BP 118/53

## 2021-02-16 VITALS — BP 123/55 | HR 43 | Resp 16

## 2021-02-16 DIAGNOSIS — I10 Essential (primary) hypertension: Secondary | ICD-10-CM

## 2021-02-16 DIAGNOSIS — C189 Malignant neoplasm of colon, unspecified: Secondary | ICD-10-CM | POA: Diagnosis not present

## 2021-02-16 DIAGNOSIS — C787 Secondary malignant neoplasm of liver and intrahepatic bile duct: Secondary | ICD-10-CM | POA: Insufficient documentation

## 2021-02-16 DIAGNOSIS — C778 Secondary and unspecified malignant neoplasm of lymph nodes of multiple regions: Secondary | ICD-10-CM | POA: Insufficient documentation

## 2021-02-16 DIAGNOSIS — Z5112 Encounter for antineoplastic immunotherapy: Secondary | ICD-10-CM | POA: Diagnosis not present

## 2021-02-16 DIAGNOSIS — Z95828 Presence of other vascular implants and grafts: Secondary | ICD-10-CM

## 2021-02-16 DIAGNOSIS — Z23 Encounter for immunization: Secondary | ICD-10-CM | POA: Insufficient documentation

## 2021-02-16 DIAGNOSIS — C184 Malignant neoplasm of transverse colon: Secondary | ICD-10-CM | POA: Insufficient documentation

## 2021-02-16 LAB — CBC WITH DIFFERENTIAL/PLATELET
Abs Immature Granulocytes: 0.02 10*3/uL (ref 0.00–0.07)
Basophils Absolute: 0 10*3/uL (ref 0.0–0.1)
Basophils Relative: 1 %
Eosinophils Absolute: 0.2 10*3/uL (ref 0.0–0.5)
Eosinophils Relative: 3 %
HCT: 32.5 % — ABNORMAL LOW (ref 36.0–46.0)
Hemoglobin: 10.8 g/dL — ABNORMAL LOW (ref 12.0–15.0)
Immature Granulocytes: 0 %
Lymphocytes Relative: 29 %
Lymphs Abs: 1.9 10*3/uL (ref 0.7–4.0)
MCH: 34.8 pg — ABNORMAL HIGH (ref 26.0–34.0)
MCHC: 33.2 g/dL (ref 30.0–36.0)
MCV: 104.8 fL — ABNORMAL HIGH (ref 80.0–100.0)
Monocytes Absolute: 0.5 10*3/uL (ref 0.1–1.0)
Monocytes Relative: 8 %
Neutro Abs: 4 10*3/uL (ref 1.7–7.7)
Neutrophils Relative %: 59 %
Platelets: 161 10*3/uL (ref 150–400)
RBC: 3.1 MIL/uL — ABNORMAL LOW (ref 3.87–5.11)
RDW: 17.1 % — ABNORMAL HIGH (ref 11.5–15.5)
WBC: 6.6 10*3/uL (ref 4.0–10.5)
nRBC: 0 % (ref 0.0–0.2)

## 2021-02-16 LAB — COMPREHENSIVE METABOLIC PANEL
ALT: 8 U/L (ref 0–44)
AST: 17 U/L (ref 15–41)
Albumin: 3.5 g/dL (ref 3.5–5.0)
Alkaline Phosphatase: 101 U/L (ref 38–126)
Anion gap: 5 (ref 5–15)
BUN: 32 mg/dL — ABNORMAL HIGH (ref 6–20)
CO2: 18 mmol/L — ABNORMAL LOW (ref 22–32)
Calcium: 9 mg/dL (ref 8.9–10.3)
Chloride: 115 mmol/L — ABNORMAL HIGH (ref 98–111)
Creatinine, Ser: 1.21 mg/dL — ABNORMAL HIGH (ref 0.44–1.00)
GFR, Estimated: 51 mL/min — ABNORMAL LOW (ref >60)
Glucose, Bld: 88 mg/dL (ref 70–99)
Potassium: 4.5 mmol/L (ref 3.5–5.1)
Sodium: 138 mmol/L (ref 135–145)
Total Bilirubin: 0.4 mg/dL (ref 0.3–1.2)
Total Protein: 7.4 g/dL (ref 6.5–8.1)

## 2021-02-16 LAB — TOTAL PROTEIN, URINE DIPSTICK: Protein, ur: NEGATIVE mg/dL

## 2021-02-16 LAB — CEA (IN HOUSE-CHCC): CEA (CHCC-In House): 231.96 ng/mL — ABNORMAL HIGH (ref 0.00–5.00)

## 2021-02-16 MED ORDER — HEPARIN SOD (PORK) LOCK FLUSH 100 UNIT/ML IV SOLN
500.0000 [IU] | Freq: Once | INTRAVENOUS | Status: AC | PRN
  Administered 2021-02-16: 500 [IU]

## 2021-02-16 MED ORDER — INFLUENZA VAC A&B SA ADJ QUAD 0.5 ML IM PRSY: 0.5000 mL | PREFILLED_SYRINGE | Freq: Once | INTRAMUSCULAR | Status: DC

## 2021-02-16 MED ORDER — SODIUM CHLORIDE 0.9% FLUSH
10.0000 mL | INTRAVENOUS | Status: DC | PRN
  Administered 2021-02-16: 10 mL

## 2021-02-16 MED ORDER — SODIUM CHLORIDE 0.9 % IV SOLN
7.5000 mg/kg | Freq: Once | INTRAVENOUS | Status: AC
  Administered 2021-02-16: 800 mg via INTRAVENOUS
  Filled 2021-02-16: qty 32

## 2021-02-16 MED ORDER — SODIUM CHLORIDE 0.9 % IV SOLN: Freq: Once | INTRAVENOUS | Status: AC

## 2021-02-16 MED ORDER — INFLUENZA VAC SPLIT QUAD 0.5 ML IM SUSY
0.5000 mL | PREFILLED_SYRINGE | Freq: Once | INTRAMUSCULAR | Status: AC
  Administered 2021-02-16: 0.5 mL via INTRAMUSCULAR
  Filled 2021-02-16: qty 0.5

## 2021-02-16 NOTE — Addendum Note (Signed)
Addended by: Donnie Aho on: 02/16/2021 04:07 PM   Modules accepted: Orders

## 2021-02-16 NOTE — Progress Notes (Signed)
[place]   [phone] [phone]   Clinic Follow up Note   Patient Care Team: [name], [name], MD as PCP - General (Internal Medicine) Clovis Riley, MD as Consulting Physician (General Surgery) Truitt Merle, MD as Consulting Physician (Hematology) [name], NP as Nurse Practitioner (Nurse Practitioner)  Date of Service:  02/16/2021  CHIEF COMPLAINT: f/u of metastatic colon cancer  CURRENT THERAPY:  Maintenance Avastin and Xeloda $RemoveBe'2000mg'qypbnYahT$  q12h on day 1-14 every 3 weeks   ASSESSMENT & PLAN:  Casey Black is a 60 y.o. female with   1. 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 -PET from 02/26/19 shows known liver metastasis and metastatic lymphadenopathy in chest and right axilla. -[name] shows MSI stable disease, Kras+ No targetable mutation, she is not a candidate for EGFR or immunotherapy. -Began palliative first line chemo on 03/12/2019, received 5FU/leuc with first 2 cycles for large open abdominal wound after surgery. She started full dose FOLFOX and avastin with cycle 2. Due to neuropathy Oxaliplatin was stopped after 09/24/19, she has been on maintenance therapy. -For convenience she was switched to oral chemo Xeloda 2 weeks on/1 week off and Bev every 3 weeks in 01/21/2020.  Treatment held 05/01/2020 - 05/12/2020 due to COVID infection -restaging CT 02/13/21 showed stable liver mets, no new lesions or evidence of progression. I reviewed with her today. -She is clinically doing well. Labs reviewed, overall stable. Continue Xeloda and bevacizumab maintenance therapy. She is tolerating very well    2. Chemotherapy induced peripheral neuropathy (CIPN), G1 -secondary to Oxaliplatin, developed after  cycle 12 FOLFOX. Oxaliplatin d/c after C15 on 09/24/19 -Neuropathy in fingers and feet are moderate and stable with mild decreased function in fingers.  -She is currently on gabapentin 100 mg AM and 300 mg qHS and titrate up if needed.  -overall improved, no impact on her function now  -Will continue to monitor.    3. 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 Will give IV iron if needed  -Mild and stable anemia lately. Hgb 10.8 today (02/16/21)   4. HTN, uncontrolled  -on hydralazine, isosorbide, lisinopril, metoprolol, amlodipine and HCTZ  -Will monitor on bevacizumab.    5. Goals of care discussion, Social support -The patient understands the goal of care is palliative. She is full code now  -She worked in [employer] at 2 different jobs but has been on disability since her diagnosis.   9. COVID (+) 05/01/20 -She tested positive for COVID on 05/01/20. She received treatment infusion on 05/06/20. Symptoms overall resolved after treatment.  -Per patient she received 2 COVID vaccines. Will proceed with COVID booster      PLAN: -proceed with bevacizumab today -lab, flush, f/u, and beva in 3 and 6 weeks   No problem-specific Assessment & Plan notes found for this encounter.   SUMMARY OF ONCOLOGIC HISTORY: Oncology History Overview Note  Cancer Staging Adenocarcinoma of colon metastatic to liver [facility]) Staging form: Colon and Rectum, AJCC 8th Edition - Pathologic stage from 01/24/2019: Stage IVA (pT4a, pN1a, pM1a) - Signed by [name], NP on 02/14/2019    Adenocarcinoma of colon metastatic to liver ([place])  01/23/2019 Imaging   ABD Xray IMPRESSION: 1.  Bowel-gas pattern consistent with small bowel obstruction. No free air. 2. No acute chest findings.   01/23/2019 Imaging   CT AP IMPRESSION: Obstructing mid transverse colonic mass with mild regional adenopathy and hepatic metastatic  disease. The mass likely extends through the serosa; no ascites or peritoneal nodularity.   01/24/2019 Surgery   Surgeon: Clovis Riley MD Assistant: [name] Procedure performed: Transverse colectomy with end colostomy, liver biopsy Procedure classification: URGENT/EMERGENT Preop diagnosis: Obstructing, metastatic transverse colon mass Post-op diagnosis/intraop findings: Same   01/24/2019 Pathology Results   FINAL MICROSCOPIC DIAGNOSIS:   A. COLON, TRANSVERSE, RESECTION:  Colonic adenocarcinoma, 5 cm.  Carcinoma extends into pericolonic connective tissue and focally to  serosal surface.  Margins not involved.  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   B. LIVER NODULE, LEFT, BIOPSY:  Metastatic adenocarcinoma.    01/24/2019 Cancer Staging   Staging form: Colon and Rectum, AJCC 8th Edition - Pathologic stage from 01/24/2019: Stage IVA (pT4a, pN1a, pM1a) - Signed by [name], NP on 02/14/2019    02/02/2019 Initial Diagnosis   Adenocarcinoma of colon metastatic to liver ([place])   02/26/2019 PET scan   IMPRESSION: 1. Hypermetabolic metastatic disease in the liver and mediastinal/hilar/axillary lymph nodes. 2. Focal hypermetabolism in the rectum. Continued attention on follow-up exams is warranted. 3. Focal hypermetabolism medial to the right adrenal gland may be within a metastatic lymph node, better visualized on 01/23/2019. 4. Aortic atherosclerosis (ICD10-170.0). Coronary artery calcification.   03/12/2019 -  Chemotherapy   She started 5FU q2weeks on 03/12/19 for 2 cycles. She started full dose FOLFOX with Avastin on 04/09/19. Oxaliplatin dose reduced repeatedly due to neuropathy C12 and held since C16 on 10/09/19. Now on maintenance Avastin and 5FU q2weeks since 10/09/19       -Maintenance change to maintenance xeloda 2000 mg BID days 1-14 q21 days and q3 weeks Zirabev (15 mg/kg) starting 01/21/20. First cycle was taken $RemoveB'1000mg'kGhGcvGt$  BID due to  misunderstanding.    05/31/2019 Imaging   Restaging CT CAP IMPRESSION: 1. Similar to mild interval decrease in size of multiple hepatic lesions, partially calcified. 2. 2 mm right upper lobe pulmonary nodule. Recommend attention on follow-up. 3. Emphysema and aortic atherosclerosis.   08/23/2019 Imaging   CT CAP w contrast  IMPRESSION: 1. The dominant peripheral right liver metastasis has mildly increased. Other smaller liver metastases are stable. 2. Otherwise no new or progressive metastatic disease in the chest, abdomen or pelvis. 3. Aortic Atherosclerosis (ICD10-I70.0) and Emphysema (ICD10-J43.9).   11/27/2019 Imaging   CT CAP w contrast  IMPRESSION: Status post transverse colectomy with right mid abdominal colostomy.   Mildly progressive hepatic metastases, as above.   No evidence of metastatic disease in the chest. Small mediastinal lymph nodes are within normal limits.   Additional stable ancillary findings as above.   02/21/2020 Imaging   IMPRESSION: 1. Stable hepatic metastatic disease. 2. Aortic atherosclerosis (ICD10-I70.0). Coronary artery calcification. 3.  Emphysema (ICD10-J43.9).   05/19/2020 Imaging   CT CAP  IMPRESSION: 1. Multiple partially calcified liver metastases are again noted. With the exception of a small lesion in segment 7/8 lesions are not significantly changed in the interval. No new liver lesions identified. 2. Coronary artery atherosclerotic calcifications. 3. Aortic atherosclerosis. 4. 2 mm right upper lobe lung nodule identified.  Unchanged.   Aortic Atherosclerosis (ICD10-I70.0).   11/17/2020 Imaging   CT CAP  IMPRESSION: 1. Partially calcified lesions throughout the liver are stable accounting for differences in technique, contrasted  imaging on today's study is compared to noncontrast imaging on the prior. 2. No new hepatic lesions. 3. Tiny 3 mm pulmonary nodule in the RIGHT upper lobe unchanged since the prior study. Attention  on follow-up. 4. Mild fullness of RIGHT paratracheal nodal tissue is minimally increased and borderline enlarged, attention on follow-up. 5. RIGHT lower quadrant colostomy. 6. 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 7. Aortic atherosclerosis.      INTERVAL HISTORY:  Casey Black is here for a follow up of metastatic colon cancer. She was last seen by me on 01/23/21. She presents to the clinic alone. She reports she is doing well overall. She continues to tolerate maintenance treatment well.   All other systems were reviewed with the patient and are negative.  MEDICAL HISTORY:  Past Medical History:  Diagnosis Date   Anemia    low iron   Cancer (HCC)    colon cancer   Colon cancer ([place]) 01/2019   Hypertension 01/23/2019   Personal history of chemotherapy 01/2019   colon CA   SBO (small bowel obstruction) ([place]) 01/23/2019    SURGICAL HISTORY: Past Surgical History:  Procedure Laterality Date   CESAREAN SECTION     x2   COLOSTOMY N/A 01/24/2019   Procedure: End Loop Colostomy;  Surgeon: Clovis Riley, MD;  Location: [facility];  Service: General;  Laterality: N/A;   PARTIAL COLECTOMY N/A 01/24/2019   Procedure: PARTIAL COLECTOMY;  Surgeon: Clovis Riley, MD;  Location: [facility];  Service: General;  Laterality: N/A;   PORTACATH PLACEMENT Right 02/28/2019   Procedure: INSERTION PORT-A-CATH WITH ULTRASOUND GUIDANCE;  Surgeon: Clovis Riley, MD;  Location: [facility];  Service: General;  Laterality: Right;    I have reviewed the social history and family history with the patient and they are unchanged from previous note.  ALLERGIES:  has No Known Allergies.  MEDICATIONS:  Current Outpatient Medications  Medication Sig Dispense Refill   acetaminophen (TYLENOL) 325 MG tablet Take 2 tablets (650 mg total) by mouth every 6 (six) hours as needed.      amLODipine (NORVASC) 10 MG tablet TAKE 1 TABLET BY MOUTH EVERY DAY 90 tablet 1   benzonatate (TESSALON) 100 MG capsule Take 1 capsule (100 mg total) by mouth every 8 (eight) hours. 21 capsule 0   capecitabine (XELODA) 500 MG tablet Take 4 tablets (2,000 mg total) by mouth 2 (two) times daily after a meal. For 14 days, then off for 7 days. 112 tablet 2   ferrous sulfate 325 (65 FE) MG tablet TAKE 1 TABLET (325 MG TOTAL) BY MOUTH 2 (TWO) TIMES DAILY WITH A MEAL. 180 tablet 1   gabapentin (NEURONTIN) 300 MG capsule TAKE 1 CAPSULE BY MOUTH TWICE A DAY 60 capsule 3   hydrALAZINE (APRESOLINE) 25 MG tablet TAKE 1 TABLET BY MOUTH EVERY 8 HOURS 30 tablet 0   hydrochlorothiazide (HYDRODIURIL) 12.5 MG tablet Take 1 tablet (12.5 mg total) by mouth daily. 90 tablet 1   hydrocortisone (ANUSOL-HC) 2.5 % rectal cream Place 1 application rectally 2 (two) times daily. 30 g 0   Ibuprofen 200 MG CAPS Take 400 mg by mouth daily as needed (pain).     isosorbide mononitrate (IMDUR) 30 MG 24 hr tablet TAKE 1 TABLET BY MOUTH EVERY DAY 30 tablet 0   lidocaine (LMX) 4 % cream Apply 1 application topically 3 (three) times daily  as needed. 30 g 0   lisinopril (ZESTRIL) 40 MG tablet Take 1 tablet (40 mg total) by mouth daily. 90 tablet 1   loperamide (IMODIUM) 2 MG capsule TAKE 1 CAPSULE (2 MG TOTAL) BY MOUTH AS NEEDED FOR DIARRHEA OR LOOSE STOOLS. 90 capsule 0   metoprolol tartrate (LOPRESSOR) 50 MG tablet TAKE 1 TABLET BY MOUTH TWICE A DAY 60 tablet 0   ondansetron (ZOFRAN ODT) 4 MG disintegrating tablet 4mg  ODT q4 hours prn nausea/vomit 20 tablet 0   ondansetron (ZOFRAN) 4 MG tablet Take 1 tablet (4 mg total) by mouth every 6 (six) hours as needed for nausea. 30 tablet 0   polycarbophil (FIBERCON) 625 MG tablet Take 1 tablet (625 mg total) by mouth daily. 30 tablet 0   No current facility-administered medications for this visit.   Facility-Administered Medications Ordered in Other Visits  Medication Dose Route Frequency  Provider Last Rate Last Admin   sodium chloride flush (NS) 0.9 % injection 10 mL  10 mL Intracatheter PRN Truitt Merle, MD   10 mL at 06/06/20 1051   sodium chloride flush (NS) 0.9 % injection 10 mL  10 mL Intracatheter PRN Truitt Merle, MD   10 mL at 02/16/21 1600    PHYSICAL EXAMINATION: ECOG PERFORMANCE STATUS: 0 - Asymptomatic  Vitals:   02/16/21 1423  BP: (!) 145/56  Pulse: (!) 47  Resp: 17  Temp: 98.6 F (37 C)  SpO2: 100%   Wt Readings from Last 3 Encounters:  02/16/21 236 lb 9.6 oz (107.3 kg)  01/23/21 236 lb 4.8 oz (107.2 kg)  01/02/21 232 lb 9 oz (105.5 kg)     GENERAL:alert, no distress and comfortable SKIN: skin color normal, no rashes or significant lesions EYES: normal, Conjunctiva are pink and non-injected, sclera clear  NEURO: alert & oriented x 3 with fluent speech  LABORATORY DATA:  I have reviewed the data as listed CBC Latest Ref Rng & Units 02/16/2021 01/23/2021 01/02/2021  WBC 4.0 - 10.5 K/uL 6.6 5.7 6.1  Hemoglobin 12.0 - 15.0 g/dL 10.8(L) 10.1(L) 10.6(L)  Hematocrit 36.0 - 46.0 % 32.5(L) 31.4(L) 32.0(L)  Platelets 150 - 400 K/uL 161 131(L) 184     CMP Latest Ref Rng & Units 02/16/2021 01/23/2021 01/02/2021  Glucose 70 - 99 mg/dL 88 109(H) 52(L)  BUN 6 - 20 mg/dL 32(H) 33(H) 26(H)  Creatinine 0.44 - 1.00 mg/dL 1.21(H) 1.06(H) 1.16(H)  Sodium 135 - 145 mmol/L 138 138 140  Potassium 3.5 - 5.1 mmol/L 4.5 4.4 4.3  Chloride 98 - 111 mmol/L 115(H) 114(H) 114(H)  CO2 22 - 32 mmol/L 18(L) 19(L) 19(L)  Calcium 8.9 - 10.3 mg/dL 9.0 9.3 9.3  Total Protein 6.5 - 8.1 g/dL 7.4 7.6 7.5  Total Bilirubin 0.3 - 1.2 mg/dL 0.4 0.3 0.5  Alkaline Phos 38 - 126 U/L 101 82 98  AST 15 - 41 U/L 17 18 18   ALT 0 - 44 U/L 8 11 8       RADIOGRAPHIC STUDIES: I have personally reviewed the radiological images as listed and agreed with the findings in the report. No results found.    No orders of the defined types were placed in this encounter.  All questions were  answered. The patient knows to call the clinic with any problems, questions or concerns. No barriers to learning was detected. The total time spent in the appointment was 30 minutes.     Truitt Merle, MD 02/16/2021   I, Wilburn Mylar, am acting as scribe for  Truitt Merle, MD.   I have reviewed the above documentation for accuracy and completeness, and I agree with the above.

## 2021-02-16 NOTE — Patient Instructions (Signed)
[facility]ICAL [facility]COLOG[facility]  Discharge Instructions: °Thank you for choosing [facility] to provide your oncology and hematology care.  ° °If you have a lab appointment with the Cancer Center, please go directly to the Cancer Center and check in at the registration area. °  °Wear comfortable clothing and clothing appropriate for easy access to any Portacath or PICC line.  ° °We strive to give you quality time with your provider. You may need to reschedule your appointment if you arrive late (15 or more minutes).  Arriving late affects you and other patients whose appointments are after yours.  Also, if you miss three or more appointments without notifying the office, you may be dismissed from the clinic at the provider’s discretion.    °  °For prescription refill requests, have your pharmacy contact our office and allow 72 hours for refills to be completed.   ° °Today you received the following chemotherapy and/or immunotherapy agents: Zirabev    °  °To help prevent nausea and vomiting after your treatment, we encourage you to take your nausea medication as directed. ° °BELOW ARE SYMPTOMS THAT SHOULD BE REPORTED IMMEDIATELY: °*FEVER GREATER THAN 100.4 F (38 °C) OR HIGHER °*CHILLS OR SWEATING °*NAUSEA AND VOMITING THAT IS NOT CONTROLLED WITH YOUR NAUSEA MEDICATION °*UNUSUAL SHORTNESS OF BREATH °*UNUSUAL BRUISING OR BLEEDING °*URINARY PROBLEMS (pain or burning when urinating, or frequent urination) °*BOWEL PROBLEMS (unusual diarrhea, constipation, pain near the anus) °TENDERNESS IN MOUTH AND THROAT WITH OR WITHOUT PRESENCE OF ULCERS (sore throat, sores in mouth, or a toothache) °UNUSUAL RASH, SWELLING OR PAIN  °UNUSUAL VAGINAL DISCHARGE OR ITCHING  ° °Items with * indicate a potential emergency and should be followed up as soon as possible or go to the Emergency Department if any problems should occur. ° °Please show the CHEMOTHERAPY ALERT CARD or IMMUNOTHERAPY ALERT CARD at check-in to  the Emergency Department and triage nurse. ° °Should you have questions after your visit or need to cancel or reschedule your appointment, please contact [facility]  Dept: [phone]  and follow the prompts.  Office hours are 8:00 a.m. to 4:30 p.m. Monday - Friday. Please note that voicemails left after 4:00 p.m. may not be returned until the following business day.  We are closed weekends and major holidays. You have access to a nurse at all times for urgent questions. Please call the main number to the clinic Dept: [phone] and follow the prompts. ° ° °For any non-urgent questions, you may also contact your provider using MyChart. We now offer e-Visits for anyone 18 and older to request care online for non-urgent symptoms. For details visit [email or web address]. °  °Also download the MyChart app! Go to the app store, search "MyChart", open the app, select [facility], and log in with your MyChart username and password. ° °Due to Covid, a mask is required upon entering the hospital/clinic. If you do not have a mask, one will be given to you upon arrival. For doctor visits, patients may have 1 support person aged 18 or older with them. For treatment visits, patients cannot have anyone with them due to current Covid guidelines and our immunocompromised population.  ° °

## 2021-02-17 ENCOUNTER — Other Ambulatory Visit (HOSPITAL_COMMUNITY): Payer: Self-pay

## 2021-02-19 DIAGNOSIS — T8131XD Disruption of external operation (surgical) wound, not elsewhere classified, subsequent encounter: Secondary | ICD-10-CM | POA: Diagnosis not present

## 2021-02-19 DIAGNOSIS — Z933 Colostomy status: Secondary | ICD-10-CM | POA: Diagnosis not present

## 2021-02-19 DIAGNOSIS — K56609 Unspecified intestinal obstruction, unspecified as to partial versus complete obstruction: Secondary | ICD-10-CM | POA: Diagnosis not present

## 2021-02-19 DIAGNOSIS — S31109A Unspecified open wound of abdominal wall, unspecified quadrant without penetration into peritoneal cavity, initial encounter: Secondary | ICD-10-CM | POA: Diagnosis not present

## 2021-02-24 ENCOUNTER — Telehealth: Payer: Self-pay | Admitting: Hematology

## 2021-02-24 NOTE — Telephone Encounter (Signed)
Scheduled follow-up appointment per 11/14 los. Patient is aware. 

## 2021-03-04 ENCOUNTER — Other Ambulatory Visit (HOSPITAL_COMMUNITY): Payer: Self-pay

## 2021-03-09 ENCOUNTER — Inpatient Hospital Stay: Payer: Medicaid Other | Attending: Hematology

## 2021-03-09 ENCOUNTER — Other Ambulatory Visit: Payer: Self-pay | Admitting: Hematology

## 2021-03-09 ENCOUNTER — Inpatient Hospital Stay: Payer: Medicaid Other | Admitting: Hematology

## 2021-03-09 ENCOUNTER — Other Ambulatory Visit: Payer: Self-pay | Admitting: Internal Medicine

## 2021-03-09 ENCOUNTER — Inpatient Hospital Stay: Payer: Medicaid Other

## 2021-03-09 ENCOUNTER — Other Ambulatory Visit (HOSPITAL_COMMUNITY): Payer: Self-pay

## 2021-03-09 ENCOUNTER — Other Ambulatory Visit: Payer: Self-pay

## 2021-03-09 ENCOUNTER — Encounter: Payer: Self-pay | Admitting: Hematology

## 2021-03-09 VITALS — BP 150/51 | HR 46 | Temp 98.2°F | Resp 18 | Ht 67.0 in | Wt 238.3 lb

## 2021-03-09 DIAGNOSIS — C787 Secondary malignant neoplasm of liver and intrahepatic bile duct: Secondary | ICD-10-CM | POA: Diagnosis not present

## 2021-03-09 DIAGNOSIS — Z5112 Encounter for antineoplastic immunotherapy: Secondary | ICD-10-CM | POA: Diagnosis not present

## 2021-03-09 DIAGNOSIS — C184 Malignant neoplasm of transverse colon: Secondary | ICD-10-CM | POA: Diagnosis present

## 2021-03-09 DIAGNOSIS — C189 Malignant neoplasm of colon, unspecified: Secondary | ICD-10-CM | POA: Diagnosis not present

## 2021-03-09 DIAGNOSIS — I10 Essential (primary) hypertension: Secondary | ICD-10-CM

## 2021-03-09 DIAGNOSIS — Z95828 Presence of other vascular implants and grafts: Secondary | ICD-10-CM

## 2021-03-09 LAB — CBC WITH DIFFERENTIAL/PLATELET
Abs Immature Granulocytes: 0.02 10*3/uL (ref 0.00–0.07)
Basophils Absolute: 0 10*3/uL (ref 0.0–0.1)
Basophils Relative: 0 %
Eosinophils Absolute: 0.2 10*3/uL (ref 0.0–0.5)
Eosinophils Relative: 3 %
HCT: 33.4 % — ABNORMAL LOW (ref 36.0–46.0)
Hemoglobin: 11 g/dL — ABNORMAL LOW (ref 12.0–15.0)
Immature Granulocytes: 0 %
Lymphocytes Relative: 25 %
Lymphs Abs: 1.8 10*3/uL (ref 0.7–4.0)
MCH: 34.5 pg — ABNORMAL HIGH (ref 26.0–34.0)
MCHC: 32.9 g/dL (ref 30.0–36.0)
MCV: 104.7 fL — ABNORMAL HIGH (ref 80.0–100.0)
Monocytes Absolute: 0.6 10*3/uL (ref 0.1–1.0)
Monocytes Relative: 8 %
Neutro Abs: 4.6 10*3/uL (ref 1.7–7.7)
Neutrophils Relative %: 64 %
Platelets: 147 10*3/uL — ABNORMAL LOW (ref 150–400)
RBC: 3.19 MIL/uL — ABNORMAL LOW (ref 3.87–5.11)
RDW: 17.2 % — ABNORMAL HIGH (ref 11.5–15.5)
WBC: 7.3 10*3/uL (ref 4.0–10.5)
nRBC: 0 % (ref 0.0–0.2)

## 2021-03-09 LAB — COMPREHENSIVE METABOLIC PANEL
ALT: 11 U/L (ref 0–44)
AST: 16 U/L (ref 15–41)
Albumin: 3.5 g/dL (ref 3.5–5.0)
Alkaline Phosphatase: 92 U/L (ref 38–126)
Anion gap: 7 (ref 5–15)
BUN: 29 mg/dL — ABNORMAL HIGH (ref 6–20)
CO2: 18 mmol/L — ABNORMAL LOW (ref 22–32)
Calcium: 9.2 mg/dL (ref 8.9–10.3)
Chloride: 114 mmol/L — ABNORMAL HIGH (ref 98–111)
Creatinine, Ser: 1.03 mg/dL — ABNORMAL HIGH (ref 0.44–1.00)
GFR, Estimated: >60 mL/min (ref >60)
Glucose, Bld: 95 mg/dL (ref 70–99)
Potassium: 4.3 mmol/L (ref 3.5–5.1)
Sodium: 139 mmol/L (ref 135–145)
Total Bilirubin: 0.5 mg/dL (ref 0.3–1.2)
Total Protein: 7.5 g/dL (ref 6.5–8.1)

## 2021-03-09 LAB — TOTAL PROTEIN, URINE DIPSTICK: Protein, ur: NEGATIVE mg/dL

## 2021-03-09 MED ORDER — SODIUM CHLORIDE 0.9 % IV SOLN: Freq: Once | INTRAVENOUS | Status: AC

## 2021-03-09 MED ORDER — SODIUM CHLORIDE 0.9% FLUSH
10.0000 mL | INTRAVENOUS | Status: DC | PRN
  Administered 2021-03-09: 10 mL

## 2021-03-09 MED ORDER — CAPECITABINE 500 MG PO TABS
2000.0000 mg | ORAL_TABLET | Freq: Two times a day (BID) | ORAL | 2 refills | Status: DC
  Filled 2021-03-09: qty 112, 21d supply, fill #0
  Filled 2021-04-07 – 2021-04-17 (×2): qty 112, 21d supply, fill #1
  Filled 2021-04-30: qty 112, 21d supply, fill #2

## 2021-03-09 MED ORDER — HEPARIN SOD (PORK) LOCK FLUSH 100 UNIT/ML IV SOLN
500.0000 [IU] | Freq: Once | INTRAVENOUS | Status: AC | PRN
  Administered 2021-03-09: 500 [IU]

## 2021-03-09 MED ORDER — SODIUM CHLORIDE 0.9 % IV SOLN
7.5000 mg/kg | Freq: Once | INTRAVENOUS | Status: AC
  Administered 2021-03-09: 800 mg via INTRAVENOUS
  Filled 2021-03-09: qty 32

## 2021-03-09 NOTE — Progress Notes (Addendum)
[place]   [phone] [phone]   Clinic Follow up Note   Patient Care Team: [name], [name], MD as PCP - General (Internal Medicine) Clovis Riley, MD as Consulting Physician (General Surgery) Truitt Merle, MD as Consulting Physician (Hematology) [name], NP as Nurse Practitioner (Nurse Practitioner)  Date of Service:  03/09/2021  CHIEF COMPLAINT: f/u of metastatic colon cancer  CURRENT THERAPY:  Maintenance Avastin and Xeloda 2042m q12h on day 1-14 every 3 weeks   ASSESSMENT & PLAN:  Casey Berquistis a 60y.o. female with   1. 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 -PET from 02/26/19 shows known liver metastasis and metastatic lymphadenopathy in chest and right axilla. -[name] shows MSI stable disease, Kras+ No targetable mutation, she is not a candidate for EGFR or immunotherapy. -Began palliative first line chemo on 03/12/2019, received 5FU/leuc with first 2 cycles for large open abdominal wound after surgery. She started full dose FOLFOX and avastin with cycle 2. Due to neuropathy Oxaliplatin was stopped after 09/24/19, she has been on maintenance therapy. -For convenience she was switched to oral chemo Xeloda 2 weeks on/1 week off and Bev every 3 weeks in 01/21/2020.  Treatment held 05/01/2020 - 05/12/2020 due to COVID infection -restaging CT 02/13/21 showed stable liver mets, no new lesions or evidence of progression. -She is clinically doing well. Labs reviewed, overall stable. Continue Xeloda and bevacizumab maintenance therapy. She is tolerating very well    2. Chemotherapy induced peripheral neuropathy (CIPN), G1 -secondary to Oxaliplatin, developed after cycle 12 FOLFOX. Oxaliplatin  d/c after C15 on 09/24/19 -Neuropathy in fingers and feet are moderate and stable with mild decreased function in fingers.  -She is currently on gabapentin 100 mg AM and 300 mg qHS and titrate up if needed.  -overall improved, no impact on her function now  -Will continue to monitor.    3. 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 Will give IV iron if needed  -Mild and stable anemia lately. Hgb 11 today (03/09/21)   4. HTN, uncontrolled  -on hydralazine, isosorbide, lisinopril, metoprolol, amlodipine and HCTZ  -Will monitor on bevacizumab.    5. Goals of care discussion, Social support -The patient understands the goal of care is palliative. She is full code now  -She worked in [employer] 2 different jobs but has been on disability since her diagnosis.   9. COVID (+) 05/01/20 -She tested positive for COVID on 05/01/20. She received treatment infusion on 05/06/20. Symptoms overall resolved after treatment.  -Per patient she received 2 COVID vaccines. Will proceed with COVID booster      PLAN: -proceed with bevacizumab today, she has started this cycle Xeloda  -lab, flush, f/u, and beva in 3 and 6 weeks   No problem-specific Assessment & Plan notes found for this encounter.   SUMMARY OF ONCOLOGIC HISTORY: Oncology History Overview Note  Cancer Staging Adenocarcinoma of colon metastatic to liver ([facility] Staging form: Colon and Rectum, AJCC 8th Edition - Pathologic stage from 01/24/2019: Stage IVA (pT4a, pN1a, pM1a) - Signed by BAlla Feeling NP on 02/14/2019    [place]den[place] to liver [place]  01/23/2019 Imaging   ABD Xray  IMPRESSION: 1. Bowel-gas pattern consistent with small bowel obstruction. No free air. 2. No acute chest findings.   01/23/2019 Imaging   CT AP IMPRESSION: Obstructing mid transverse colonic mass with mild regional adenopathy and hepatic metastatic  disease. The mass likely extends through the serosa; no ascites or peritoneal nodularity.   01/24/2019 Surgery   Surgeon: Clovis Riley MD Assistant: [name] Procedure performed: Transverse colectomy with end colostomy, liver biopsy Procedure classification: URGENT/EMERGENT Preop diagnosis: Obstructing, metastatic transverse colon mass Post-op diagnosis/intraop findings: Same   01/24/2019 Pathology Results   FINAL MICROSCOPIC DIAGNOSIS:   A. COLON, TRANSVERSE, RESECTION:  Colonic adenocarcinoma, 5 cm.  Carcinoma extends into pericolonic connective tissue and focally to  serosal surface.  Margins not involved.  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   B. LIVER NODULE, LEFT, BIOPSY:  Metastatic adenocarcinoma.    01/24/2019 Cancer Staging   Staging form: Colon and Rectum, AJCC 8th Edition - Pathologic stage from 01/24/2019: Stage IVA (pT4a, pN1a, pM1a) - Signed by [name], NP on 02/14/2019    02/02/2019 Initial Diagnosis   Adenocarcinoma of colon metastatic to liver ([place])   02/26/2019 PET scan   IMPRESSION: 1. Hypermetabolic metastatic disease in the liver and mediastinal/hilar/axillary lymph nodes. 2. Focal hypermetabolism in the rectum. Continued attention on follow-up exams is warranted. 3. Focal hypermetabolism medial to the right adrenal gland may be within a metastatic lymph node, better visualized on 01/23/2019. 4. Aortic atherosclerosis (ICD10-170.0). Coronary artery calcification.   03/12/2019 -  Chemotherapy   She started 5FU q2weeks on 03/12/19 for 2 cycles. She started full dose FOLFOX with Avastin on 04/09/19. Oxaliplatin dose reduced repeatedly due to neuropathy C12 and held since C16 on 10/09/19. Now on maintenance Avastin and 5FU q2weeks since 10/09/19       -Maintenance change to maintenance xeloda 2000 mg BID days 1-14 q21 days and q3 weeks Zirabev (15 mg/kg) starting 01/21/20. First cycle was taken 1048m BID due to  misunderstanding.    05/31/2019 Imaging   Restaging CT CAP IMPRESSION: 1. Similar to mild interval decrease in size of multiple hepatic lesions, partially calcified. 2. 2 mm right upper lobe pulmonary nodule. Recommend attention on follow-up. 3. Emphysema and aortic atherosclerosis.   08/23/2019 Imaging   CT CAP w contrast  IMPRESSION: 1. The dominant peripheral right liver metastasis has mildly increased. Other smaller liver metastases are stable. 2. Otherwise no new or progressive metastatic disease in the chest, abdomen or pelvis. 3. Aortic Atherosclerosis (ICD10-I70.0) and Emphysema (ICD10-J43.9).   11/27/2019 Imaging   CT CAP w contrast  IMPRESSION: Status post transverse colectomy with right mid abdominal colostomy.   Mildly progressive hepatic metastases, as above.   No evidence of metastatic disease in the chest. Small mediastinal lymph nodes are within normal limits.   Additional stable ancillary findings as above.   02/21/2020 Imaging   IMPRESSION: 1. Stable hepatic metastatic disease. 2. Aortic atherosclerosis (ICD10-I70.0). Coronary artery calcification. 3.  Emphysema (ICD10-J43.9).   05/19/2020 Imaging   CT CAP  IMPRESSION: 1. Multiple partially calcified liver metastases are again noted. With the exception of a small lesion in segment 7/8 lesions are not significantly changed in the interval. No new liver lesions identified. 2. Coronary artery atherosclerotic calcifications. 3. Aortic atherosclerosis. 4. 2 mm right upper lobe lung nodule identified.  Unchanged.   Aortic Atherosclerosis (ICD10-I70.0).   11/17/2020 Imaging   CT CAP  IMPRESSION: 1. Partially calcified lesions throughout the liver are stable accounting for differences in  technique, contrasted imaging on today's study is compared to noncontrast imaging on the prior. 2. No new hepatic lesions. 3. Tiny 3 mm pulmonary nodule in the RIGHT upper lobe unchanged since the prior study. Attention  on follow-up. 4. Mild fullness of RIGHT paratracheal nodal tissue is minimally increased and borderline enlarged, attention on follow-up. 5. RIGHT lower quadrant colostomy. 6. 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 7. Aortic atherosclerosis.      INTERVAL HISTORY:  Casey Black is here for a follow up of metastatic colon cancer. She was last seen by me on 02/16/21. She presents to the clinic alone. She reports she is continuing to tolerate treatment well. She reports some skin dryness, which she is managing with lotion. She is otherwise doing very well, no pain or other complains.    All other systems were reviewed with the patient and are negative.  MEDICAL HISTORY:  Past Medical History:  Diagnosis Date   Anemia    low iron   Cancer (HCC)    colon cancer   Colon cancer ([place]) 01/2019   Hypertension 01/23/2019   Personal history of chemotherapy 01/2019   colon CA   SBO (small bowel obstruction) ([place]) 01/23/2019    SURGICAL HISTORY: Past Surgical History:  Procedure Laterality Date   CESAREAN SECTION     x2   COLOSTOMY N/A 01/24/2019   Procedure: End Loop Colostomy;  Surgeon: Clovis Riley, MD;  Location: [facility];  Service: General;  Laterality: N/A;   PARTIAL COLECTOMY N/A 01/24/2019   Procedure: PARTIAL COLECTOMY;  Surgeon: Clovis Riley, MD;  Location: [facility];  Service: General;  Laterality: N/A;   PORTACATH PLACEMENT Right 02/28/2019   Procedure: INSERTION PORT-A-CATH WITH ULTRASOUND GUIDANCE;  Surgeon: Clovis Riley, MD;  Location: [facility];  Service: General;  Laterality: Right;    I have reviewed the social history and family history with the patient and they are unchanged from previous note.  ALLERGIES:  has No Known Allergies.  MEDICATIONS:  Current Outpatient Medications  Medication Sig Dispense Refill   acetaminophen (TYLENOL)  325 MG tablet Take 2 tablets (650 mg total) by mouth every 6 (six) hours as needed.     amLODipine (NORVASC) 10 MG tablet TAKE 1 TABLET BY MOUTH EVERY DAY 90 tablet 1   benzonatate (TESSALON) 100 MG capsule Take 1 capsule (100 mg total) by mouth every 8 (eight) hours. 21 capsule 0   capecitabine (XELODA) 500 MG tablet Take 4 tablets (2,000 mg total) by mouth 2 (two) times daily after a meal. For 14 days, then off for 7 days. 112 tablet 2   ferrous sulfate 325 (65 FE) MG tablet TAKE 1 TABLET (325 MG TOTAL) BY MOUTH 2 (TWO) TIMES DAILY WITH A MEAL. 180 tablet 1   gabapentin (NEURONTIN) 300 MG capsule TAKE 1 CAPSULE BY MOUTH TWICE A DAY 60 capsule 3   hydrALAZINE (APRESOLINE) 25 MG tablet TAKE 1 TABLET BY MOUTH EVERY 8 HOURS 30 tablet 0   hydrochlorothiazide (HYDRODIURIL) 12.5 MG tablet Take 1 tablet (12.5 mg total) by mouth daily. 90 tablet 1   hydrocortisone (ANUSOL-HC) 2.5 % rectal cream Place 1 application rectally 2 (two) times daily. 30 g 0   Ibuprofen 200 MG CAPS Take 400 mg by mouth daily as needed (pain).     isosorbide mononitrate (IMDUR) 30 MG 24 hr tablet TAKE 1 TABLET BY MOUTH  EVERY DAY 30 tablet 0   lidocaine (LMX) 4 % cream Apply 1 application topically 3 (three) times daily as needed. 30 g 0   lisinopril (ZESTRIL) 40 MG tablet Take 1 tablet (40 mg total) by mouth daily. 90 tablet 1   loperamide (IMODIUM) 2 MG capsule TAKE 1 CAPSULE (2 MG TOTAL) BY MOUTH AS NEEDED FOR DIARRHEA OR LOOSE STOOLS. 90 capsule 0   metoprolol tartrate (LOPRESSOR) 50 MG tablet TAKE 1 TABLET BY MOUTH TWICE A DAY 60 tablet 0   ondansetron (ZOFRAN ODT) 4 MG disintegrating tablet 3m ODT q4 hours prn nausea/vomit 20 tablet 0   ondansetron (ZOFRAN) 4 MG tablet Take 1 tablet (4 mg total) by mouth every 6 (six) hours as needed for nausea. 30 tablet 0   polycarbophil (FIBERCON) 625 MG tablet Take 1 tablet (625 mg total) by mouth daily. 30 tablet 0   No current facility-administered medications for this visit.    Facility-Administered Medications Ordered in Other Visits  Medication Dose Route Frequency Provider Last Rate Last Admin   sodium chloride flush (NS) 0.9 % injection 10 mL  10 mL Intracatheter PRN FTruitt Merle MD   10 mL at 06/06/20 1051   sodium chloride flush (NS) 0.9 % injection 10 mL  10 mL Intracatheter PRN FTruitt Merle MD   10 mL at 03/09/21 1410    PHYSICAL EXAMINATION: ECOG PERFORMANCE STATUS: 0 - Asymptomatic  Vitals:   03/09/21 1159  BP: (!) 150/51  Pulse: (!) 46  Resp: 18  Temp: 98.2 F (36.8 C)  SpO2: 100%   Wt Readings from Last 3 Encounters:  03/09/21 238 lb 4.8 oz (108.1 kg)  02/16/21 236 lb 9.6 oz (107.3 kg)  01/23/21 236 lb 4.8 oz (107.2 kg)     GENERAL:alert, no distress and comfortable SKIN: skin color, texture, turgor are normal, no rashes or significant lesions EYES: normal, Conjunctiva are pink and non-injected, sclera clear ABDOMEN:abdomen soft, non-tender and normal bowel sounds, (+) hernia with mild prolapse NEURO: alert & oriented x 3 with fluent speech, no focal motor/sensory deficits  LABORATORY DATA:  I have reviewed the data as listed CBC Latest Ref Rng & Units 03/09/2021 02/16/2021 01/23/2021  WBC 4.0 - 10.5 K/uL 7.3 6.6 5.7  Hemoglobin 12.0 - 15.0 g/dL 11.0(L) 10.8(L) 10.1(L)  Hematocrit 36.0 - 46.0 % 33.4(L) 32.5(L) 31.4(L)  Platelets 150 - 400 K/uL 147(L) 161 131(L)     CMP Latest Ref Rng & Units 03/09/2021 02/16/2021 01/23/2021  Glucose 70 - 99 mg/dL 95 88 109(H)  BUN 6 - 20 mg/dL 29(H) 32(H) 33(H)  Creatinine 0.44 - 1.00 mg/dL 1.03(H) 1.21(H) 1.06(H)  Sodium 135 - 145 mmol/L 139 138 138  Potassium 3.5 - 5.1 mmol/L 4.3 4.5 4.4  Chloride 98 - 111 mmol/L 114(H) 115(H) 114(H)  CO2 22 - 32 mmol/L 18(L) 18(L) 19(L)  Calcium 8.9 - 10.3 mg/dL 9.2 9.0 9.3  Total Protein 6.5 - 8.1 g/dL 7.5 7.4 7.6  Total Bilirubin 0.3 - 1.2 mg/dL 0.5 0.4 0.3  Alkaline Phos 38 - 126 U/L 92 101 82  AST 15 - 41 U/L _0 ALT 0 - 44 U/L _1 RADIOGRAPHIC STUDIES: I have personally reviewed the radiological images as listed and agreed with the findings in the report. No results found.    No orders of the defined types were placed in this encounter.  All questions were answered. The patient knows to call the clinic with any  problems, questions or concerns. No barriers to learning was detected.      Truitt Merle, MD 03/09/2021   I, Wilburn Mylar, am acting as scribe for Truitt Merle, MD.   I have reviewed the above documentation for accuracy and completeness, and I agree with the above.

## 2021-03-09 NOTE — Patient Instructions (Signed)
[facility] MEDICAL ONCOLOGY  Discharge Instructions: °Thank you for choosing [facility] to provide your oncology and hematology care.  ° °If you have a lab appointment with the Cancer Center, please go directly to the Cancer Center and check in at the registration area. °  °Wear comfortable clothing and clothing appropriate for easy access to any Portacath or PICC line.  ° °We strive to give you quality time with your provider. You may need to reschedule your appointment if you arrive late (15 or more minutes).  Arriving late affects you and other patients whose appointments are after yours.  Also, if you miss three or more appointments without notifying the office, you may be dismissed from the clinic at the provider’s discretion.    °  °For prescription refill requests, have your pharmacy contact our office and allow 72 hours for refills to be completed.   ° °Today you received the following chemotherapy and/or immunotherapy agents: Zirabev    °  °To help prevent nausea and vomiting after your treatment, we encourage you to take your nausea medication as directed. ° °BELOW ARE SYMPTOMS THAT SHOULD BE REPORTED IMMEDIATELY: °*FEVER GREATER THAN 100.4 F (38 °C) OR HIGHER °*CHILLS OR SWEATING °*NAUSEA AND VOMITING THAT IS NOT CONTROLLED WITH YOUR NAUSEA MEDICATION °*UNUSUAL SHORTNESS OF BREATH °*UNUSUAL BRUISING OR BLEEDING °*URINARY PROBLEMS (pain or burning when urinating, or frequent urination) °*BOWEL PROBLEMS (unusual diarrhea, constipation, pain near the anus) °TENDERNESS IN MOUTH AND THROAT WITH OR WITHOUT PRESENCE OF ULCERS (sore throat, sores in mouth, or a toothache) °UNUSUAL RASH, SWELLING OR PAIN  °UNUSUAL VAGINAL DISCHARGE OR ITCHING  ° °Items with * indicate a potential emergency and should be followed up as soon as possible or go to the Emergency Department if any problems should occur. ° °Please show the CHEMOTHERAPY ALERT CARD or IMMUNOTHERAPY ALERT CARD at check-in to  the Emergency Department and triage nurse. ° °Should you have questions after your visit or need to cancel or reschedule your appointment, please contact [facility]  Dept: [phone]  and follow the prompts.  Office hours are 8:00 a.m. to 4:30 p.m. Monday - Friday. Please note that voicemails left after 4:00 p.m. may not be returned until the following business day.  We are closed weekends and major holidays. You have access to a nurse at all times for urgent questions. Please call the main number to the clinic Dept: [phone] and follow the prompts. ° ° °For any non-urgent questions, you may also contact your provider using MyChart. We now offer e-Visits for anyone 18 and older to request care online for non-urgent symptoms. For details visit [email or web address]. °  °Also download the MyChart app! Go to the app store, search "MyChart", open the app, select [facility], and log in with your MyChart username and password. ° °Due to Covid, a mask is required upon entering the hospital/clinic. If you do not have a mask, one will be given to you upon arrival. For doctor visits, patients may have 1 support person aged 18 or older with them. For treatment visits, patients cannot have anyone with them due to current Covid guidelines and our immunocompromised population.  ° °

## 2021-03-10 ENCOUNTER — Telehealth: Payer: Self-pay | Admitting: Hematology

## 2021-03-10 NOTE — Telephone Encounter (Signed)
Scheduled follow-up appointment per 12/5 los. Patient is aware.

## 2021-03-11 ENCOUNTER — Other Ambulatory Visit (HOSPITAL_COMMUNITY): Payer: Self-pay

## 2021-03-13 ENCOUNTER — Other Ambulatory Visit: Payer: Self-pay | Admitting: Hematology

## 2021-03-20 DIAGNOSIS — S31109A Unspecified open wound of abdominal wall, unspecified quadrant without penetration into peritoneal cavity, initial encounter: Secondary | ICD-10-CM | POA: Diagnosis not present

## 2021-03-20 DIAGNOSIS — K56609 Unspecified intestinal obstruction, unspecified as to partial versus complete obstruction: Secondary | ICD-10-CM | POA: Diagnosis not present

## 2021-03-20 DIAGNOSIS — T8131XD Disruption of external operation (surgical) wound, not elsewhere classified, subsequent encounter: Secondary | ICD-10-CM | POA: Diagnosis not present

## 2021-03-20 DIAGNOSIS — Z933 Colostomy status: Secondary | ICD-10-CM | POA: Diagnosis not present

## 2021-03-25 ENCOUNTER — Telehealth: Payer: Self-pay

## 2021-03-25 ENCOUNTER — Other Ambulatory Visit: Payer: Self-pay | Admitting: Hematology

## 2021-03-25 MED ORDER — CEPHALEXIN 500 MG PO CAPS: 500.0000 mg | ORAL_CAPSULE | Freq: Three times a day (TID) | ORAL | 0 refills | Status: DC

## 2021-03-25 NOTE — Telephone Encounter (Signed)
LVM regarding pt's voicemail for Dr. Burr Medico regarding pt's toenail.  Pt LVM stating that the pt's toenail came off and the pt thinks her foot is infected again.  Pt requested if Dr. Burr Medico could prescribe another abx for the infection.  Returned call to pt to get more detail.  Pt returned voicemail to give Dr. Ernestina Penna office a call.  Pt stated her Rt Great Toe is where to toenail came off.  Pt stated at night when she's lying down the toe throbs and she experiences 4/10 pain.  Pt said toe is swollen, little to no discharge but the discharge seen was clear, toe has a foul odor, and slightly warm to touch.  Pt stated Dr. Burr Medico gave her Abx in the past when her other toe got infected and it cleared it up.  Pt is requesting same medication for this toe.  Pt stated she's soaking the toe in vinegar & water and keeping the foot elevated but d/t the swelling the pt stated she can barely walk on the foot.  Pt stated she has neuropathy in her feet from her chemotherapy so she does not have much feelings in her feet.  Informed pt that Dr. Burr Medico will be notified of her complaints and this RN will follow up with her with Dr. Ernestina Penna response.  Pt had no further questions or concerns at this time and will await Dr. Ernestina Penna response.  Dr. Burr Medico ordered Keflex and sent order to pt's preferred pharmacy for pickup.

## 2021-03-26 ENCOUNTER — Telehealth: Payer: Self-pay

## 2021-03-26 NOTE — Telephone Encounter (Signed)
Notified Daughter Laurence Compton of completion of FMLA forms as requested

## 2021-03-27 ENCOUNTER — Other Ambulatory Visit (HOSPITAL_COMMUNITY): Payer: Self-pay

## 2021-03-31 ENCOUNTER — Inpatient Hospital Stay: Payer: Medicaid Other

## 2021-03-31 ENCOUNTER — Encounter: Payer: Self-pay | Admitting: Adult Health

## 2021-03-31 ENCOUNTER — Other Ambulatory Visit: Payer: Self-pay

## 2021-03-31 ENCOUNTER — Inpatient Hospital Stay: Payer: Medicaid Other | Admitting: Adult Health

## 2021-03-31 VITALS — BP 124/52 | HR 42 | Resp 18

## 2021-03-31 VITALS — BP 126/58 | HR 46 | Temp 97.5°F | Resp 16 | Ht 67.0 in | Wt 237.0 lb

## 2021-03-31 DIAGNOSIS — C787 Secondary malignant neoplasm of liver and intrahepatic bile duct: Secondary | ICD-10-CM

## 2021-03-31 DIAGNOSIS — C189 Malignant neoplasm of colon, unspecified: Secondary | ICD-10-CM

## 2021-03-31 DIAGNOSIS — Z5112 Encounter for antineoplastic immunotherapy: Secondary | ICD-10-CM | POA: Diagnosis not present

## 2021-03-31 DIAGNOSIS — Z95828 Presence of other vascular implants and grafts: Secondary | ICD-10-CM

## 2021-03-31 LAB — COMPREHENSIVE METABOLIC PANEL
ALT: 10 U/L (ref 0–44)
AST: 16 U/L (ref 15–41)
Albumin: 3.8 g/dL (ref 3.5–5.0)
Alkaline Phosphatase: 82 U/L (ref 38–126)
Anion gap: 5 (ref 5–15)
BUN: 34 mg/dL — ABNORMAL HIGH (ref 8–23)
CO2: 19 mmol/L — ABNORMAL LOW (ref 22–32)
Calcium: 9.4 mg/dL (ref 8.9–10.3)
Chloride: 111 mmol/L (ref 98–111)
Creatinine, Ser: 1.15 mg/dL — ABNORMAL HIGH (ref 0.44–1.00)
GFR, Estimated: 54 mL/min — ABNORMAL LOW (ref >60)
Glucose, Bld: 119 mg/dL — ABNORMAL HIGH (ref 70–99)
Potassium: 5 mmol/L (ref 3.5–5.1)
Sodium: 135 mmol/L (ref 135–145)
Total Bilirubin: 0.6 mg/dL (ref 0.3–1.2)
Total Protein: 7.5 g/dL (ref 6.5–8.1)

## 2021-03-31 LAB — CBC WITH DIFFERENTIAL/PLATELET
Abs Immature Granulocytes: 0.01 K/uL (ref 0.00–0.07)
Basophils Absolute: 0 K/uL (ref 0.0–0.1)
Basophils Relative: 1 %
Eosinophils Absolute: 0.2 K/uL (ref 0.0–0.5)
Eosinophils Relative: 3 %
HCT: 35.3 % — ABNORMAL LOW (ref 36.0–46.0)
Hemoglobin: 11.3 g/dL — ABNORMAL LOW (ref 12.0–15.0)
Immature Granulocytes: 0 %
Lymphocytes Relative: 36 %
Lymphs Abs: 2 K/uL (ref 0.7–4.0)
MCH: 34.5 pg — ABNORMAL HIGH (ref 26.0–34.0)
MCHC: 32 g/dL (ref 30.0–36.0)
MCV: 107.6 fL — ABNORMAL HIGH (ref 80.0–100.0)
Monocytes Absolute: 0.4 K/uL (ref 0.1–1.0)
Monocytes Relative: 7 %
Neutro Abs: 3 K/uL (ref 1.7–7.7)
Neutrophils Relative %: 53 %
Platelets: 136 K/uL — ABNORMAL LOW (ref 150–400)
RBC: 3.28 MIL/uL — ABNORMAL LOW (ref 3.87–5.11)
RDW: 17.3 % — ABNORMAL HIGH (ref 11.5–15.5)
WBC: 5.6 K/uL (ref 4.0–10.5)
nRBC: 0 % (ref 0.0–0.2)

## 2021-03-31 LAB — TOTAL PROTEIN, URINE DIPSTICK: Protein, ur: <30 mg/dL — AB

## 2021-03-31 MED ORDER — HEPARIN SOD (PORK) LOCK FLUSH 100 UNIT/ML IV SOLN
500.0000 [IU] | Freq: Once | INTRAVENOUS | Status: AC | PRN
  Administered 2021-03-31: 16:00:00 500 [IU]

## 2021-03-31 MED ORDER — SODIUM CHLORIDE 0.9% FLUSH
10.0000 mL | INTRAVENOUS | Status: DC | PRN
  Administered 2021-03-31: 13:00:00 10 mL

## 2021-03-31 MED ORDER — SODIUM CHLORIDE 0.9% FLUSH
10.0000 mL | INTRAVENOUS | Status: DC | PRN
  Administered 2021-03-31: 16:00:00 10 mL

## 2021-03-31 MED ORDER — SODIUM CHLORIDE 0.9 % IV SOLN
7.5000 mg/kg | Freq: Once | INTRAVENOUS | Status: AC
  Administered 2021-03-31: 15:00:00 800 mg via INTRAVENOUS
  Filled 2021-03-31: qty 32

## 2021-03-31 MED ORDER — SODIUM CHLORIDE 0.9 % IV SOLN: Freq: Once | INTRAVENOUS | Status: AC

## 2021-03-31 NOTE — Progress Notes (Signed)
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 [facility]) Staging form: Colon and Rectum, AJCC 8th Edition - Pathologic stage from 01/24/2019: Stage IVA (pT4a, pN1a, pM1a) - Signed by Casey Feeling, NP on 02/14/2019 Total positive nodes: 1 Histologic grading system: 4 grade system Histologic grade (G): G2 Lymph-vascular invasion (LVI): LVI present/identified, NOS Tumor deposits (TD): Absent Perineural invasion (PNI): Absent   SUMMARY OF ONCOLOGIC HISTORY: Oncology History Overview Note  Cancer Staging 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 Bowel-gas pattern consistent with small bowel obstruction. No free air. 2. No acute chest findings.   01/23/2019 Imaging   CT AP IMPRESSION: Obstructing mid transverse colonic mass with mild regional adenopathy and hepatic metastatic disease. The mass likely extends through the serosa; no ascites or peritoneal nodularity.   01/24/2019 Surgery   Surgeon: Clovis Riley MD Assistant: [name] Procedure performed: Transverse colectomy with end colostomy, liver biopsy Procedure classification: URGENT/EMERGENT Preop diagnosis: Obstructing, metastatic transverse colon mass Post-op diagnosis/intraop findings: Same   01/24/2019 Pathology Results   FINAL MICROSCOPIC DIAGNOSIS:   A. COLON, TRANSVERSE, RESECTION:  Colonic adenocarcinoma, 5 cm.  Carcinoma extends into pericolonic connective tissue and focally to  serosal surface.  Margins not involved.  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    B. LIVER NODULE, LEFT, BIOPSY:  Metastatic adenocarcinoma.    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 Hypermetabolic metastatic disease in the liver and mediastinal/hilar/axillary lymph nodes. 2. Focal hypermetabolism in the rectum. Continued attention on follow-Black exams is warranted. 3. Focal hypermetabolism medial to the right adrenal gland may be within a metastatic lymph node, better visualized on 01/23/2019. 4. Aortic atherosclerosis (ICD10-170.0). Coronary artery calcification.   03/12/2019 -  Chemotherapy   She started 5FU q2weeks on 03/12/19 for 2 cycles. She started full dose FOLFOX with Avastin on 04/09/19. Oxaliplatin dose reduced repeatedly due to neuropathy C12 and held since C16 on 10/09/19. Now on maintenance Avastin and 5FU q2weeks since 10/09/19       -Maintenance change to maintenance xeloda 2000 mg BID days 1-14 q21 days and q3 weeks Zirabev (15 mg/kg) starting 01/21/20. First cycle was taken 1000mg  BID due to misunderstanding.    05/31/2019 Imaging   Restaging CT CAP IMPRESSION: 1. Similar to mild interval decrease in size of multiple hepatic lesions, partially calcified. 2. 2 mm right upper lobe pulmonary nodule. Recommend attention on follow-Black. 3. Emphysema and aortic atherosclerosis.   08/23/2019 Imaging   CT CAP w contrast  IMPRESSION: 1. The dominant peripheral right liver metastasis has mildly increased. Other smaller liver metastases are stable. 2. Otherwise no new or progressive metastatic disease in the chest, abdomen or pelvis. 3. Aortic Atherosclerosis (ICD10-I70.0) and Emphysema (ICD10-J43.9).   11/27/2019 Imaging   CT CAP w contrast  IMPRESSION: Status  post transverse colectomy  with right mid abdominal colostomy.   Mildly progressive hepatic metastases, as above.   No evidence of metastatic disease in the chest. Small mediastinal lymph nodes are within normal limits.   Additional stable ancillary findings as above.   02/21/2020 Imaging   IMPRESSION: 1. Stable hepatic metastatic disease. 2. Aortic atherosclerosis (ICD10-I70.0). Coronary artery calcification. 3.  Emphysema (ICD10-J43.9).   05/19/2020 Imaging   CT CAP  IMPRESSION: 1. Multiple partially calcified liver metastases are again noted. With the exception of a small lesion in segment 7/8 lesions are not significantly changed in the interval. No new liver lesions identified. 2. Coronary artery atherosclerotic calcifications. 3. Aortic atherosclerosis. 4. 2 mm right upper lobe lung nodule identified.  Unchanged.   Aortic Atherosclerosis (ICD10-I70.0).   11/17/2020 Imaging   CT CAP  IMPRESSION: 1. Partially calcified lesions throughout the liver are stable accounting for differences in technique, contrasted imaging on today's study is compared to noncontrast imaging on the prior. 2. No new hepatic lesions. 3. Tiny 3 mm pulmonary nodule in the RIGHT upper lobe unchanged since the prior study. Attention on follow-Black. 4. Mild fullness of RIGHT paratracheal nodal tissue is minimally increased and borderline enlarged, attention on follow-Black. 5. RIGHT lower quadrant colostomy. 6. 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 7. Aortic atherosclerosis.     CURRENT THERAPY: Capecitabine/Bevacizumab  INTERVAL HISTORY: Casey Black 60 y.o. female returns for evaluation of her metastatic colon cancer.  She continues on Capectiabine 2000mg  PO 2 weeks on and 1 week off and Bevacizumab IV once every 3 weeks.  She tolerates this well.  She notes that she has some dryness on her hands and  feet, however no cracking of the skin.  Her most recent CT chest/abdomen/pelvis on 02/13/2021 showed stable exam with similar size and number of metastatic liver lesions.  No new signs of metastatic disease were noted elsewhere in the scan.    She continues on Gabapentin 100mg  in the morning and 300mg  in the evening for her chemotherapy induced peripheral neuropathy.    She continues on a few different anti-hypertensives including Hydralazine, Isosorbide, Lisinopril, Metoprolol, Amlodipine and HCTZ.  Her blood pressure has been under good control today it was 126/58.  We are checking her urine for protein with each treatment.  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  MEDICAL HISTORY: Past Medical History:  Diagnosis Date   Anemia    low iron   Cancer (HCC)    colon cancer   Colon cancer ([place]) 01/2019   Hypertension 01/23/2019   Personal history of chemotherapy 01/2019   colon CA   SBO (small bowel obstruction) ([place]) 01/23/2019    SURGICAL HISTORY: Past Surgical History:  Procedure Laterality Date   CESAREAN SECTION     x2   COLOSTOMY N/A 01/24/2019   Procedure: End Loop Colostomy;  Surgeon: Clovis Riley, MD;  Location: [facility] OR;  Service: General;  Laterality: N/A;   PARTIAL COLECTOMY N/A 01/24/2019   Procedure: PARTIAL COLECTOMY;  Surgeon: Clovis Riley, MD;  Location: [place];  Service: General;  Laterality: N/A;   PORTACATH  PLACEMENT Right 02/28/2019   Procedure: INSERTION PORT-A-CATH WITH ULTRASOUND GUIDANCE;  Surgeon: Clovis Riley, MD;  Location: [facility] OR;  Service: General;  Laterality: Right;    SOCIAL HISTORY: Social History   Socioeconomic History   Marital status: Married    Spouse name: Not on file   Number of children: 2   Years of education: Not on file   Highest education level: Not on file  Occupational History   Not on file  Tobacco Use   Smoking status: Former    Packs/day: 0.50    Years: 29.00    Pack years: 14.50    Types: Cigarettes    Quit date: 01/04/2019    Years since quitting: 2.2   Smokeless tobacco: Never   Tobacco comments:    desires patch  Vaping Use   Vaping Use: Never used  Substance and Sexual Activity   Alcohol use: Not Currently    Comment: a pint a week   Drug use: Never   Sexual activity: Not on file  Other Topics Concern   Not on file  Social History Narrative   Not on file   Social Determinants of Health   Financial Resource Strain: Not on file  Food Insecurity: Not on file  Transportation Needs: Not on file  Physical Activity: Not on file  Stress: Not on file  Social Connections: Not on file  Intimate Partner Violence: Not on file    FAMILY HISTORY: Family History  Problem Relation Age of Onset   Kidney failure Father    Hypertension Father    Hypertension Sister     Review of Systems  Constitutional:  Positive for fatigue. Negative for appetite change, chills, fever and unexpected weight change.  HENT:   Negative for hearing loss, lump/mass and trouble swallowing.   Eyes:  Negative for eye problems and icterus.  Respiratory:  Negative for chest tightness, cough and shortness of breath.   Cardiovascular:  Negative for chest pain, leg swelling and palpitations.  Gastrointestinal:  Negative for abdominal distention, abdominal pain, constipation, diarrhea, nausea and vomiting.  Endocrine: Negative for hot flashes.  Genitourinary:   Negative for difficulty urinating.   Musculoskeletal:  Negative for arthralgias.  Skin:  Negative for itching and rash.  Neurological:  Negative for dizziness, extremity weakness, headaches and numbness.  Hematological:  Negative for adenopathy. Does not bruise/bleed easily.  Psychiatric/Behavioral:  Negative for depression. The patient is not nervous/anxious.      PHYSICAL EXAMINATION  ECOG PERFORMANCE STATUS: 1 - Symptomatic but completely ambulatory  Vitals:   03/31/21 1348  BP: (!) 126/58  Pulse: (!) 46  Resp: 16  Temp: (!) 97.5 F (36.4 C)  SpO2: 100%    Physical Exam Constitutional:      General: She is not in acute distress.    Appearance: Normal appearance. She is not toxic-appearing.  HENT:     Head: Normocephalic and atraumatic.  Eyes:     General: No scleral icterus. Cardiovascular:     Rate and Rhythm: Normal rate and regular rhythm.     Pulses: Normal pulses.     Heart sounds: Normal heart sounds.  Pulmonary:     Effort: Pulmonary effort is normal.     Breath sounds: Normal breath sounds.  Abdominal:  General: Abdomen is flat. Bowel sounds are normal. There is no distension.     Palpations: Abdomen is soft.     Tenderness: There is no abdominal tenderness.  Musculoskeletal:        General: No swelling.     Cervical back: Neck supple.  Lymphadenopathy:     Cervical: No cervical adenopathy.  Skin:    General: Skin is warm and dry.     Findings: No rash.  Neurological:     General: No focal deficit present.     Mental Status: She is alert.  Psychiatric:        Mood and Affect: Mood normal.        Behavior: Behavior normal.    LABORATORY DATA:  CBC    Component Value Date/Time   WBC 5.6 03/31/2021 1333   RBC 3.28 (L) 03/31/2021 1333   HGB 11.3 (L) 03/31/2021 1333   HGB 10.5 (L) 12/12/2020 1306   HCT 35.3 (L) 03/31/2021 1333   PLT 136 (L) 03/31/2021 1333   PLT 183 12/12/2020 1306   MCV 107.6 (H) 03/31/2021 1333   MCH 34.5 (H) 03/31/2021  1333   MCHC 32.0 03/31/2021 1333   RDW 17.3 (H) 03/31/2021 1333   LYMPHSABS 2.0 03/31/2021 1333   MONOABS 0.4 03/31/2021 1333   EOSABS 0.2 03/31/2021 1333   BASOSABS 0.0 03/31/2021 1333    CMP     Component Value Date/Time   NA 135 03/31/2021 1333   K 5.0 03/31/2021 1333   CL 111 03/31/2021 1333   CO2 19 (L) 03/31/2021 1333   GLUCOSE 119 (H) 03/31/2021 1333   BUN 34 (H) 03/31/2021 1333   CREATININE 1.15 (H) 03/31/2021 1333   CREATININE 1.06 (H) 01/23/2021 1304   CALCIUM 9.4 03/31/2021 1333   PROT 7.5 03/31/2021 1333   ALBUMIN 3.8 03/31/2021 1333   AST 16 03/31/2021 1333   AST 18 01/23/2021 1304   ALT 10 03/31/2021 1333   ALT 11 01/23/2021 1304   ALKPHOS 82 03/31/2021 1333   BILITOT 0.6 03/31/2021 1333   BILITOT 0.3 01/23/2021 1304   GFRNONAA 54 (L) 03/31/2021 1333   GFRNONAA >60 01/23/2021 1304   GFRAA >60 01/07/2020 0957       ASSESSMENT and THERAPY PLAN:   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  1.  Metastatic colon cancer: Her most recent restaging scans were completed in November and showed no progression of her cancer.  She continues on treatment with capecitabine and bevacizumab with good tolerance.  2.  Skin dryness.  She is managing this with lotions and orals at home.  I did give her one of our samples of lotion that often helps with dryness and cracking of the hands.  Casey Black will proceed with her treatment today she is tolerating it well and notes no significant side effects associated with it.  She will continue on the capecitabine 2 weeks on and 1 week off.  Casey Black will return in 3 weeks for labs, follow-Black, and her next treatment.    All questions were answered. The patient knows to call the clinic with any problems, questions or concerns. We can certainly see the patient much sooner if  necessary.  Total encounter time: 30 minutes in face-to-face visit time, chart review, lab review, care coordination, and documentation of the encounter.  Wilber Bihari, NP 03/31/21 6:59 PM Medical Oncology and Hematology  [facility] [place], [place] [address] Tel. [phone]    Fax. [phone]  *Total Encounter Time as defined by the Centers for Medicare and Medicaid Services includes, in addition to the face-to-face time of a patient visit (documented in the note above) non-face-to-face time: obtaining and reviewing outside history, ordering and reviewing medications, tests or procedures, care coordination (communications with other health care professionals or caregivers) and documentation in the medical record.

## 2021-03-31 NOTE — Assessment & Plan Note (Signed)
Casey Black is a 60 year old woman who was diagnosed with metastatic colon cancer currently on treatment with capecitabine and bevacizumab.  1.  Metastatic colon cancer: Her most recent restaging scans were completed in November and showed no progression of her cancer.  She continues on treatment with capecitabine and bevacizumab with good tolerance.  2.  Skin dryness.  She is managing this with lotions and orals at home.  I did give her one of our samples of lotion that often helps with dryness and cracking of the hands.  Casey Black will proceed with her treatment today she is tolerating it well and notes no significant side effects associated with it.  She will continue on the capecitabine 2 weeks on and 1 week off.  Casey Black will return in 3 weeks for labs, follow-up, and her next treatment.

## 2021-03-31 NOTE — Patient Instructions (Signed)
[facility] MEDICAL ONCOLOGY  Discharge Instructions: °Thank you for choosing [facility] to provide your oncology and hematology care.  ° °If you have a lab appointment with the Cancer Center, please go directly to the Cancer Center and check in at the registration area. °  °Wear comfortable clothing and clothing appropriate for easy access to any Portacath or PICC line.  ° °We strive to give you quality time with your provider. You may need to reschedule your appointment if you arrive late (15 or more minutes).  Arriving late affects you and other patients whose appointments are after yours.  Also, if you miss three or more appointments without notifying the office, you may be dismissed from the clinic at the provider’s discretion.    °  °For prescription refill requests, have your pharmacy contact our office and allow 72 hours for refills to be completed.   ° °Today you received the following chemotherapy and/or immunotherapy agents: bevacizumab    °  °To help prevent nausea and vomiting after your treatment, we encourage you to take your nausea medication as directed. ° °BELOW ARE SYMPTOMS THAT SHOULD BE REPORTED IMMEDIATELY: °*FEVER GREATER THAN 100.4 F (38 °C) OR HIGHER °*CHILLS OR SWEATING °*NAUSEA AND VOMITING THAT IS NOT CONTROLLED WITH YOUR NAUSEA MEDICATION °*UNUSUAL SHORTNESS OF BREATH °*UNUSUAL BRUISING OR BLEEDING °*URINARY PROBLEMS (pain or burning when urinating, or frequent urination) °*BOWEL PROBLEMS (unusual diarrhea, constipation, pain near the anus) °TENDERNESS IN MOUTH AND THROAT WITH OR WITHOUT PRESENCE OF ULCERS (sore throat, sores in mouth, or a toothache) °UNUSUAL RASH, SWELLING OR PAIN  °UNUSUAL VAGINAL DISCHARGE OR ITCHING  ° °Items with * indicate a potential emergency and should be followed up as soon as possible or go to the Emergency Department if any problems should occur. ° °Please show the CHEMOTHERAPY ALERT CARD or IMMUNOTHERAPY ALERT CARD at check-in  to the Emergency Department and triage nurse. ° °Should you have questions after your visit or need to cancel or reschedule your appointment, please contact [facility] MEDICAL [facility]COLOG[facility]  Dept: [phone]  and follow the prompts.  Office hours are 8:00 a.m. to 4:30 p.m. Monday - Friday. Please note that voicemails left after 4:00 p.m. may not be returned until the following business day.  We are closed weekends and major holidays. You have access to a nurse at all times for urgent questions. Please call the main number to the clinic Dept: [phone] and follow the prompts. ° ° °For any non-urgent questions, you may also contact your provider using MyChart. We now offer e-Visits for anyone 18 and older to request care online for non-urgent symptoms. For details visit [email or web address]. °  °Also download the MyChart app! Go to the app store, search "MyChart", open the app, select [facility], and log in with your MyChart username and password. ° °Due to Covid, a mask is required upon entering the hospital/clinic. If you do not have a mask, one will be given to you upon arrival. For doctor visits, patients may have 1 support person aged 18 or older with them. For treatment visits, patients cannot have anyone with them due to current Covid guidelines and our immunocompromised population.  ° °

## 2021-04-01 ENCOUNTER — Other Ambulatory Visit (HOSPITAL_COMMUNITY): Payer: Self-pay

## 2021-04-07 ENCOUNTER — Other Ambulatory Visit (HOSPITAL_COMMUNITY): Payer: Self-pay

## 2021-04-13 ENCOUNTER — Other Ambulatory Visit: Payer: Self-pay | Admitting: Internal Medicine

## 2021-04-15 ENCOUNTER — Other Ambulatory Visit (HOSPITAL_COMMUNITY): Payer: Self-pay

## 2021-04-17 ENCOUNTER — Other Ambulatory Visit (HOSPITAL_COMMUNITY): Payer: Self-pay

## 2021-04-20 ENCOUNTER — Encounter: Payer: Self-pay | Admitting: Hematology

## 2021-04-20 ENCOUNTER — Inpatient Hospital Stay: Payer: Medicaid Other

## 2021-04-20 ENCOUNTER — Inpatient Hospital Stay: Payer: Medicaid Other | Admitting: Hematology

## 2021-04-20 ENCOUNTER — Other Ambulatory Visit: Payer: Self-pay

## 2021-04-20 ENCOUNTER — Inpatient Hospital Stay: Payer: Medicaid Other | Attending: Hematology

## 2021-04-20 VITALS — BP 149/52 | HR 53 | Temp 97.7°F | Resp 18 | Wt 236.5 lb

## 2021-04-20 DIAGNOSIS — C189 Malignant neoplasm of colon, unspecified: Secondary | ICD-10-CM

## 2021-04-20 DIAGNOSIS — C787 Secondary malignant neoplasm of liver and intrahepatic bile duct: Secondary | ICD-10-CM

## 2021-04-20 DIAGNOSIS — C184 Malignant neoplasm of transverse colon: Secondary | ICD-10-CM | POA: Insufficient documentation

## 2021-04-20 DIAGNOSIS — Z95828 Presence of other vascular implants and grafts: Secondary | ICD-10-CM

## 2021-04-20 DIAGNOSIS — Z5112 Encounter for antineoplastic immunotherapy: Secondary | ICD-10-CM | POA: Diagnosis not present

## 2021-04-20 LAB — COMPREHENSIVE METABOLIC PANEL
ALT: 10 U/L (ref 0–44)
AST: 17 U/L (ref 15–41)
Albumin: 3.9 g/dL (ref 3.5–5.0)
Alkaline Phosphatase: 99 U/L (ref 38–126)
Anion gap: 4 — ABNORMAL LOW (ref 5–15)
BUN: 22 mg/dL (ref 8–23)
CO2: 23 mmol/L (ref 22–32)
Calcium: 9.8 mg/dL (ref 8.9–10.3)
Chloride: 108 mmol/L (ref 98–111)
Creatinine, Ser: 1 mg/dL (ref 0.44–1.00)
GFR, Estimated: >60 mL/min (ref >60)
Glucose, Bld: 91 mg/dL (ref 70–99)
Potassium: 4.8 mmol/L (ref 3.5–5.1)
Sodium: 135 mmol/L (ref 135–145)
Total Bilirubin: 0.5 mg/dL (ref 0.3–1.2)
Total Protein: 8.1 g/dL (ref 6.5–8.1)

## 2021-04-20 LAB — CBC WITH DIFFERENTIAL/PLATELET
Abs Immature Granulocytes: 0.01 10*3/uL (ref 0.00–0.07)
Basophils Absolute: 0.1 10*3/uL (ref 0.0–0.1)
Basophils Relative: 1 %
Eosinophils Absolute: 0.2 10*3/uL (ref 0.0–0.5)
Eosinophils Relative: 3 %
HCT: 35.6 % — ABNORMAL LOW (ref 36.0–46.0)
Hemoglobin: 11.6 g/dL — ABNORMAL LOW (ref 12.0–15.0)
Immature Granulocytes: 0 %
Lymphocytes Relative: 30 %
Lymphs Abs: 2.1 10*3/uL (ref 0.7–4.0)
MCH: 34.1 pg — ABNORMAL HIGH (ref 26.0–34.0)
MCHC: 32.6 g/dL (ref 30.0–36.0)
MCV: 104.7 fL — ABNORMAL HIGH (ref 80.0–100.0)
Monocytes Absolute: 0.6 10*3/uL (ref 0.1–1.0)
Monocytes Relative: 9 %
Neutro Abs: 4 10*3/uL (ref 1.7–7.7)
Neutrophils Relative %: 57 %
Platelets: 178 10*3/uL (ref 150–400)
RBC: 3.4 MIL/uL — ABNORMAL LOW (ref 3.87–5.11)
RDW: 16.7 % — ABNORMAL HIGH (ref 11.5–15.5)
WBC: 7 10*3/uL (ref 4.0–10.5)
nRBC: 0 % (ref 0.0–0.2)

## 2021-04-20 LAB — TOTAL PROTEIN, URINE DIPSTICK: Protein, ur: <30 mg/dL — AB

## 2021-04-20 MED ORDER — SODIUM CHLORIDE 0.9 % IV SOLN: Freq: Once | INTRAVENOUS | Status: AC

## 2021-04-20 MED ORDER — SODIUM CHLORIDE 0.9% FLUSH
10.0000 mL | INTRAVENOUS | Status: DC | PRN
  Administered 2021-04-20: 10 mL

## 2021-04-20 MED ORDER — SODIUM CHLORIDE 0.9 % IV SOLN
7.5000 mg/kg | Freq: Once | INTRAVENOUS | Status: AC
  Administered 2021-04-20: 800 mg via INTRAVENOUS
  Filled 2021-04-20: qty 32

## 2021-04-20 MED ORDER — HEPARIN SOD (PORK) LOCK FLUSH 100 UNIT/ML IV SOLN
500.0000 [IU] | Freq: Once | INTRAVENOUS | Status: AC | PRN
  Administered 2021-04-20: 500 [IU]

## 2021-04-20 NOTE — Progress Notes (Signed)
[place]   Telephone:([phone]) [phone] [phone]   Clinic Follow up Note   Patient Care Team: [name], [name], MD as PCP - General (Internal Medicine) Clovis Riley, MD as Consulting Physician (General Surgery) Truitt Merle, MD as Consulting Physician (Hematology) [name], NP as Nurse Practitioner (Nurse Practitioner)  Date of Service:  04/20/2021  CHIEF COMPLAINT: f/u of metastatic colon cancer  CURRENT THERAPY:  Maintenance Avastin and Xeloda 2035m q12h on day 1-14 every 3 weeks   ASSESSMENT & PLAN:  Casey Black a 61y.o. female with   1. 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 -PET from 02/26/19 shows known liver metastasis and metastatic lymphadenopathy in chest and right axilla. -[name] shows MSI stable disease, Kras+ No targetable mutation, she is not a candidate for EGFR or immunotherapy. -Began palliative first line chemo on 03/12/2019, received 5FU/leuc with first 2 cycles for large open abdominal wound after surgery. She started full dose FOLFOX and avastin with cycle 2. Due to neuropathy Oxaliplatin was stopped after 09/24/19, she has been on maintenance therapy. -For convenience she was switched to oral chemo Xeloda 2 weeks on/1 week off and Bev every 3 weeks in 01/21/2020.  Treatment held 05/01/20 - 05/12/20 due to COVID infection -restaging CT 02/13/21 showed stable liver mets, no new lesions or evidence of progression. -She is clinically doing well. Labs reviewed, overall stable. Continue Xeloda and bevacizumab maintenance therapy. She is tolerating very well    2. Chemotherapy induced peripheral neuropathy (CIPN), G1 -secondary to Oxaliplatin, developed after cycle 12 FOLFOX. Oxaliplatin d/c  after C15 on 09/24/19 -Neuropathy in fingers and feet are moderate and stable with mild decreased function in fingers.  -She is currently on gabapentin 100 mg AM and 300 mg qHS and titrate up if needed.  -overall improved, no impact on her function now  -Will continue to monitor.    3. 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 Will give IV iron if needed  -Mild and stable anemia lately. Hgb 11.6 today (04/20/21)   4. HTN, uncontrolled  -on hydralazine, isosorbide, lisinopril, metoprolol, amlodipine and HCTZ  -Will monitor on bevacizumab.    5. Goals of care discussion, Social support -The patient understands the goal of care is palliative. She is full code now  -She worked in [employer] 2 different jobs but has been on disability since her diagnosis.   9. COVID (+) 05/01/20 -She received treatment infusion on 05/06/20. Symptoms overall resolved after treatment.      PLAN: -proceed with bevacizumab today, she has started this cycle Xeloda  -lab, flush, f/u, and beva in 3 and 6 weeks   No problem-specific Assessment & Plan notes found for this encounter.   SUMMARY OF ONCOLOGIC HISTORY: Oncology History Overview Note  Cancer Staging Adenocarcinoma of colon metastatic to liver ([facility] Staging form: Colon and Rectum, AJCC 8th Edition - Pathologic stage from 01/24/2019: Stage IVA (pT4a, pN1a, pM1a) - Signed by BAlla Feeling NP on 02/14/2019    Adenocarcinoma of colon metastatic to liver ([place]  01/23/2019 Imaging   ABD Xray IMPRESSION: 1. Bowel-gas pattern consistent with small bowel obstruction. No free air. 2. No acute chest findings.   01/23/2019  Imaging   CT AP IMPRESSION: Obstructing mid transverse colonic mass with mild regional adenopathy and hepatic metastatic disease. The mass likely extends through the serosa; no ascites or peritoneal nodularity.   01/24/2019 Surgery    Surgeon: Clovis Riley MD Assistant: [name] Procedure performed: Transverse colectomy with end colostomy, liver biopsy Procedure classification: URGENT/EMERGENT Preop diagnosis: Obstructing, metastatic transverse colon mass Post-op diagnosis/intraop findings: Same   01/24/2019 Pathology Results   FINAL MICROSCOPIC DIAGNOSIS:   A. COLON, TRANSVERSE, RESECTION:  Colonic adenocarcinoma, 5 cm.  Carcinoma extends into pericolonic connective tissue and focally to  serosal surface.  Margins not involved.  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   B. LIVER NODULE, LEFT, BIOPSY:  Metastatic adenocarcinoma.    01/24/2019 Cancer Staging   Staging form: Colon and Rectum, AJCC 8th Edition - Pathologic stage from 01/24/2019: Stage IVA (pT4a, pN1a, pM1a) - Signed by [name], NP on 02/14/2019    02/02/2019 Initial Diagnosis   Adenocarcinoma of colon metastatic to liver ([place])   02/26/2019 PET scan   IMPRESSION: 1. Hypermetabolic metastatic disease in the liver and mediastinal/hilar/axillary lymph nodes. 2. Focal hypermetabolism in the rectum. Continued attention on follow-up exams is warranted. 3. Focal hypermetabolism medial to the right adrenal gland may be within a metastatic lymph node, better visualized on 01/23/2019. 4. Aortic atherosclerosis (ICD10-170.0). Coronary artery calcification.   03/12/2019 -  Chemotherapy   She started 5FU q2weeks on 03/12/19 for 2 cycles. She started full dose FOLFOX with Avastin on 04/09/19. Oxaliplatin dose reduced repeatedly due to neuropathy C12 and held since C16 on 10/09/19. Now on maintenance Avastin and 5FU q2weeks since 10/09/19       -Maintenance change to maintenance xeloda 2000 mg BID days 1-14 q21 days and q3 weeks Zirabev (15 mg/kg) starting 01/21/20. First cycle was taken 1023m BID due to misunderstanding.    05/31/2019 Imaging   Restaging CT CAP IMPRESSION: 1. Similar to mild interval decrease in size of  multiple hepatic lesions, partially calcified. 2. 2 mm right upper lobe pulmonary nodule. Recommend attention on follow-up. 3. Emphysema and aortic atherosclerosis.   08/23/2019 Imaging   CT CAP w contrast  IMPRESSION: 1. The dominant peripheral right liver metastasis has mildly increased. Other smaller liver metastases are stable. 2. Otherwise no new or progressive metastatic disease in the chest, abdomen or pelvis. 3. Aortic Atherosclerosis (ICD10-I70.0) and Emphysema (ICD10-J43.9).   11/27/2019 Imaging   CT CAP w contrast  IMPRESSION: Status post transverse colectomy with right mid abdominal colostomy.   Mildly progressive hepatic metastases, as above.   No evidence of metastatic disease in the chest. Small mediastinal lymph nodes are within normal limits.   Additional stable ancillary findings as above.   02/21/2020 Imaging   IMPRESSION: 1. Stable hepatic metastatic disease. 2. Aortic atherosclerosis (ICD10-I70.0). Coronary artery calcification. 3.  Emphysema (ICD10-J43.9).   05/19/2020 Imaging   CT CAP  IMPRESSION: 1. Multiple partially calcified liver metastases are again noted. With the exception of a small lesion in segment 7/8 lesions are not significantly changed in the interval. No new liver lesions identified. 2. Coronary artery atherosclerotic calcifications. 3. Aortic atherosclerosis. 4. 2 mm right upper lobe lung nodule identified.  Unchanged.   Aortic Atherosclerosis (ICD10-I70.0).   11/17/2020 Imaging   CT CAP  IMPRESSION: 1. Partially calcified lesions throughout the liver are stable accounting for differences in technique, contrasted imaging on today's study is compared to noncontrast imaging on the prior. 2. No new hepatic lesions. 3.  Tiny 3 mm pulmonary nodule in the RIGHT upper lobe unchanged since the prior study. Attention on follow-up. 4. Mild fullness of RIGHT paratracheal nodal tissue is minimally increased and borderline enlarged,  attention on follow-up. 5. RIGHT lower quadrant colostomy. 6. 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 7. Aortic atherosclerosis.      INTERVAL HISTORY:  Shylo Zamor is here for a follow up of metastatic colon cancer. She was last seen by NP Mendel Ryder on 03/31/21. She presents to the clinic alone. She reports she is doing well overall.   All other systems were reviewed with the patient and are negative.  MEDICAL HISTORY:  Past Medical History:  Diagnosis Date   Anemia    low iron   Cancer (HCC)    colon cancer   Colon cancer ([place]) 01/2019   Hypertension 01/23/2019   Personal history of chemotherapy 01/2019   colon CA   SBO (small bowel obstruction) ([place]) 01/23/2019    SURGICAL HISTORY: Past Surgical History:  Procedure Laterality Date   CESAREAN SECTION     x2   COLOSTOMY N/A 01/24/2019   Procedure: End Loop Colostomy;  Surgeon: Clovis Riley, MD;  Location: [place] OR;  Service: General;  Laterality: N/A;   PARTIAL COLECTOMY N/A 01/24/2019   Procedure: PARTIAL COLECTOMY;  Surgeon: Clovis Riley, MD;  Location: [place] [facility];  Service: General;  Laterality: N/A;   PORTACATH PLACEMENT Right 02/28/2019   Procedure: INSERTION PORT-A-CATH WITH ULTRASOUND GUIDANCE;  Surgeon: Clovis Riley, MD;  Location: [facility];  Service: General;  Laterality: Right;    I have reviewed the social history and family history with the patient and they are unchanged from previous note.  ALLERGIES:  has No Known Allergies.  MEDICATIONS:  Current Outpatient Medications  Medication Sig Dispense Refill   acetaminophen (TYLENOL) 325 MG tablet Take 2 tablets (650 mg total) by mouth every 6 (six) hours as needed.     amLODipine (NORVASC) 10 MG tablet TAKE 1 TABLET BY MOUTH EVERY DAY 90 tablet 1   benzonatate (TESSALON) 100 MG capsule Take 1 capsule (100 mg total) by mouth every 8  (eight) hours. 21 capsule 0   capecitabine (XELODA) 500 MG tablet Take 4 tablets (2,000 mg total) by mouth 2 (two) times daily after a meal. For 14 days, then off for 7 days. 112 tablet 2   ferrous sulfate 325 (65 FE) MG tablet TAKE 1 TABLET BY MOUTH 2 TIMES DAILY WITH A MEAL. 60 tablet 5   gabapentin (NEURONTIN) 300 MG capsule TAKE 1 CAPSULE BY MOUTH TWICE A DAY 60 capsule 3   hydrALAZINE (APRESOLINE) 25 MG tablet TAKE 1 TABLET BY MOUTH EVERY 8 HOURS 30 tablet 0   hydrochlorothiazide (HYDRODIURIL) 12.5 MG tablet Take 1 tablet (12.5 mg total) by mouth daily. 90 tablet 1   hydrocortisone (ANUSOL-HC) 2.5 % rectal cream Place 1 application rectally 2 (two) times daily. 30 g 0   Ibuprofen 200 MG CAPS Take 400 mg by mouth daily as needed (pain).     isosorbide mononitrate (IMDUR) 30 MG 24 hr tablet TAKE 1 TABLET BY MOUTH EVERY DAY 30 tablet 0   lidocaine (LMX) 4 % cream Apply 1 application topically 3 (three) times daily as needed. 30 g 0   lisinopril (ZESTRIL) 40 MG tablet Take 1 tablet (40 mg total) by mouth daily. 90 tablet 1   loperamide (IMODIUM)  2 MG capsule TAKE 1 CAPSULE (2 MG TOTAL) BY MOUTH AS NEEDED FOR DIARRHEA OR LOOSE STOOLS. 90 capsule 0   metoprolol tartrate (LOPRESSOR) 50 MG tablet TAKE 1 TABLET BY MOUTH TWICE A DAY 60 tablet 0   ondansetron (ZOFRAN ODT) 4 MG disintegrating tablet 52m ODT q4 hours prn nausea/vomit 20 tablet 0   ondansetron (ZOFRAN) 4 MG tablet Take 1 tablet (4 mg total) by mouth every 6 (six) hours as needed for nausea. 30 tablet 0   polycarbophil (FIBERCON) 625 MG tablet Take 1 tablet (625 mg total) by mouth daily. 30 tablet 0   No current facility-administered medications for this visit.   Facility-Administered Medications Ordered in Other Visits  Medication Dose Route Frequency Provider Last Rate Last Admin   sodium chloride flush (NS) 0.9 % injection 10 mL  10 mL Intracatheter PRN FTruitt Merle MD   10 mL at 06/06/20 1051    PHYSICAL EXAMINATION: ECOG  PERFORMANCE STATUS: 1 - Symptomatic but completely ambulatory  There were no vitals filed for this visit. Wt Readings from Last 3 Encounters:  04/20/21 236 lb 8 oz (107.3 kg)  03/31/21 237 lb (107.5 kg)  03/09/21 238 lb 4.8 oz (108.1 kg)     GENERAL:alert, no distress and comfortable SKIN: skin color normal, no rashes or significant lesions EYES: normal, Conjunctiva are pink and non-injected, sclera clear  NEURO: alert & oriented x 3 with fluent speech  LABORATORY DATA:  I have reviewed the data as listed CBC Latest Ref Rng & Units 04/20/2021 03/31/2021 03/09/2021  WBC 4.0 - 10.5 K/uL 7.0 5.6 7.3  Hemoglobin 12.0 - 15.0 g/dL 11.6(L) 11.3(L) 11.0(L)  Hematocrit 36.0 - 46.0 % 35.6(L) 35.3(L) 33.4(L)  Platelets 150 - 400 K/uL 178 136(L) 147(L)     CMP Latest Ref Rng & Units 04/20/2021 03/31/2021 03/09/2021  Glucose 70 - 99 mg/dL 91 119(H) 95  BUN 8 - 23 mg/dL 22 34(H) 29(H)  Creatinine 0.44 - 1.00 mg/dL 1.00 1.15(H) 1.03(H)  Sodium 135 - 145 mmol/L 135 135 139  Potassium 3.5 - 5.1 mmol/L 4.8 5.0 4.3  Chloride 98 - 111 mmol/L 108 111 114(H)  CO2 22 - 32 mmol/L 23 19(L) 18(L)  Calcium 8.9 - 10.3 mg/dL 9.8 9.4 9.2  Total Protein 6.5 - 8.1 g/dL 8.1 7.5 7.5  Total Bilirubin 0.3 - 1.2 mg/dL 0.5 0.6 0.5  Alkaline Phos 38 - 126 U/L 99 82 92  AST 15 - 41 U/L _0 ALT 0 - 44 U/L _1 RADIOGRAPHIC STUDIES: I have personally reviewed the radiological images as listed and agreed with the findings in the report. No results found.    Orders Placed This Encounter  Procedures   CEA (IN HOUSE-[facility])    Standing Status:   Standing    Number of Occurrences:   20    Standing Expiration Date:   04/20/2022   All questions were answered. The patient knows to call the clinic with any problems, questions or concerns. No barriers to learning was detected. The total time spent in the appointment was 25 minutes.     YTruitt Merle MD 04/20/2021   I, KWilburn Mylar am acting as  scribe for YTruitt Merle MD.   I have reviewed the above documentation for accuracy and completeness, and I agree with the above.

## 2021-04-20 NOTE — Patient Instructions (Signed)
[facility]COL[facility]  Discharge Instructions: °Thank you for choosing [facility] to provide your oncology and hematology care.  ° °If you have a lab appointment with the Cancer Center, please go directly to the Cancer Center and check in at the registration area. °  °Wear comfortable clothing and clothing appropriate for easy access to any Portacath or PICC line.  ° °We strive to give you quality time with your provider. You may need to reschedule your appointment if you arrive late (15 or more minutes).  Arriving late affects you and other patients whose appointments are after yours.  Also, if you miss three or more appointments without notifying the office, you may be dismissed from the clinic at the provider’s discretion.    °  °For prescription refill requests, have your pharmacy contact our office and allow 72 hours for refills to be completed.   ° °Today you received the following chemotherapy and/or immunotherapy agents: bevacizumab    °  °To help prevent nausea and vomiting after your treatment, we encourage you to take your nausea medication as directed. ° °BELOW ARE SYMPTOMS THAT SHOULD BE REPORTED IMMEDIATELY: °*FEVER GREATER THAN 100.4 F (38 °C) OR HIGHER °*CHILLS OR SWEATING °*NAUSEA AND VOMITING THAT IS NOT CONTROLLED WITH YOUR NAUSEA MEDICATION °*UNUSUAL SHORTNESS OF BREATH °*UNUSUAL BRUISING OR BLEEDING °*URINARY PROBLEMS (pain or burning when urinating, or frequent urination) °*BOWEL PROBLEMS (unusual diarrhea, constipation, pain near the anus) °TENDERNESS IN MOUTH AND THROAT WITH OR WITHOUT PRESENCE OF ULCERS (sore throat, sores in mouth, or a toothache) °UNUSUAL RASH, SWELLING OR PAIN  °UNUSUAL VAGINAL DISCHARGE OR ITCHING  ° °Items with * indicate a potential emergency and should be followed up as soon as possible or go to the Emergency Department if any problems should occur. ° °Please show the CHEMOTHERAPY ALERT CARD or IMMUNOTHERAPY ALERT CARD at check-in  to the Emergency Department and triage nurse. ° °Should you have questions after your visit or need to cancel or reschedule your appointment, please contact [facility]  Dept: [phone]  and follow the prompts.  Office hours are 8:00 a.m. to 4:30 p.m. Monday - Friday. Please note that voicemails left after 4:00 p.m. may not be returned until the following business day.  We are closed weekends and major holidays. You have access to a nurse at all times for urgent questions. Please call the main number to the clinic Dept: [phone] and follow the prompts. ° ° °For any non-urgent questions, you may also contact your provider using MyChart. We now offer e-Visits for anyone 18 and older to request care online for non-urgent symptoms. For details visit [email or web address]. °  °Also download the MyChart app! Go to the app store, search "MyChart", open the app, select [facility], and log in with your MyChart username and password. ° °Due to Covid, a mask is required upon entering the hospital/clinic. If you do not have a mask, one will be given to you upon arrival. For doctor visits, patients may have 1 support person aged 18 or older with them. For treatment visits, patients cannot have anyone with them due to current Covid guidelines and our immunocompromised population.  ° °

## 2021-04-22 ENCOUNTER — Telehealth: Payer: Self-pay | Admitting: Hematology

## 2021-04-22 NOTE — Telephone Encounter (Signed)
Scheduled follow-up appointment per 1/16 los. Patient is aware.

## 2021-04-27 DIAGNOSIS — K56609 Unspecified intestinal obstruction, unspecified as to partial versus complete obstruction: Secondary | ICD-10-CM | POA: Diagnosis not present

## 2021-04-27 DIAGNOSIS — T8131XD Disruption of external operation (surgical) wound, not elsewhere classified, subsequent encounter: Secondary | ICD-10-CM | POA: Diagnosis not present

## 2021-04-27 DIAGNOSIS — S31109A Unspecified open wound of abdominal wall, unspecified quadrant without penetration into peritoneal cavity, initial encounter: Secondary | ICD-10-CM | POA: Diagnosis not present

## 2021-04-27 DIAGNOSIS — Z933 Colostomy status: Secondary | ICD-10-CM | POA: Diagnosis not present

## 2021-04-29 ENCOUNTER — Other Ambulatory Visit (HOSPITAL_COMMUNITY): Payer: Self-pay

## 2021-04-30 ENCOUNTER — Other Ambulatory Visit (HOSPITAL_COMMUNITY): Payer: Self-pay

## 2021-05-04 ENCOUNTER — Other Ambulatory Visit: Payer: Self-pay | Admitting: Internal Medicine

## 2021-05-04 ENCOUNTER — Other Ambulatory Visit (HOSPITAL_COMMUNITY): Payer: Self-pay

## 2021-05-06 NOTE — Telephone Encounter (Signed)
Last OV 01/29/20. Per medication refill protocol medication has been refilled x 1 mo & cannot be refilled without appt or PCP approval.  LVM instructions for pt to call office to scheduled CPE with PCP. Also sent mychart message.

## 2021-05-07 ENCOUNTER — Ambulatory Visit (INDEPENDENT_AMBULATORY_CARE_PROVIDER_SITE_OTHER): Payer: Medicaid Other | Admitting: Internal Medicine

## 2021-05-07 ENCOUNTER — Encounter: Payer: Self-pay | Admitting: Internal Medicine

## 2021-05-07 VITALS — BP 134/72 | HR 59 | Temp 98.4°F | Ht 67.0 in | Wt 240.2 lb

## 2021-05-07 DIAGNOSIS — Z0000 Encounter for general adult medical examination without abnormal findings: Secondary | ICD-10-CM | POA: Diagnosis not present

## 2021-05-07 DIAGNOSIS — I10 Essential (primary) hypertension: Secondary | ICD-10-CM | POA: Diagnosis not present

## 2021-05-07 DIAGNOSIS — G620 Drug-induced polyneuropathy: Secondary | ICD-10-CM | POA: Diagnosis not present

## 2021-05-07 DIAGNOSIS — I700 Atherosclerosis of aorta: Secondary | ICD-10-CM

## 2021-05-07 DIAGNOSIS — Z23 Encounter for immunization: Secondary | ICD-10-CM | POA: Diagnosis not present

## 2021-05-07 DIAGNOSIS — C189 Malignant neoplasm of colon, unspecified: Secondary | ICD-10-CM

## 2021-05-07 DIAGNOSIS — B351 Tinea unguium: Secondary | ICD-10-CM

## 2021-05-07 DIAGNOSIS — C787 Secondary malignant neoplasm of liver and intrahepatic bile duct: Secondary | ICD-10-CM

## 2021-05-07 MED ORDER — ISOSORBIDE MONONITRATE ER 30 MG PO TB24: 30.0000 mg | ORAL_TABLET | Freq: Every day | ORAL | 0 refills | Status: DC

## 2021-05-07 MED ORDER — HYDROCHLOROTHIAZIDE 12.5 MG PO TABS: 12.5000 mg | ORAL_TABLET | Freq: Every day | ORAL | 1 refills | Status: DC

## 2021-05-07 NOTE — Addendum Note (Signed)
Addended by: Geradine Girt D on: 05/07/2021 03:24 PM   Modules accepted: Orders

## 2021-05-07 NOTE — Patient Instructions (Signed)
-  Nice seeing you today!!  -Lab work today; will notify you once results are available.  -Tdap vaccine today.  -Remember your COVID booster at the pharmacy.  -Will send you to see a foot doctor.  -Schedule follow up in 6 months or sooner as needed.

## 2021-05-07 NOTE — Progress Notes (Signed)
Established Patient Office Visit     This visit occurred during the SARS-CoV-2 public health emergency.  Safety protocols were in place, including screening questions prior to the visit, additional usage of staff PPE, and extensive cleaning of exam room while observing appropriate contact time as indicated for disinfecting solutions.    CC/Reason for Visit: Annual preventive exam  HPI: Makayela Secrest is a 61 y.o. female who is coming in today for the above mentioned reasons. Past Medical History is significant for: Adenocarcinoma of the colon on active treatment plan with oncology, significant drug-induced polyneuropathy, hypertension on multidrug regimen, aortic atherosclerosis.  She is overdue for COVID booster, shingles, Tdap vaccines.  She had a mammogram in June 2022 and a Pap smear in 2021.  She is overdue for eye care, she is up-to-date with dental care.  She would like a referral to a podiatrist due to to significant onychomycosis and she believes she needs both big toenails removed.   Past Medical/Surgical History: Past Medical History:  Diagnosis Date   Anemia    low iron   Cancer (HCC)    colon cancer   Colon cancer ([place]) 01/2019   Hypertension 01/23/2019   Personal history of chemotherapy 01/2019   colon CA   SBO (small bowel obstruction) ([place]) 01/23/2019    Past Surgical History:  Procedure Laterality Date   CESAREAN SECTION     x2   COLOSTOMY N/A 01/24/2019   Procedure: End Loop Colostomy;  Surgeon: Clovis Riley, MD;  Location: [facility];  Service: General;  Laterality: N/A;   PARTIAL COLECTOMY N/A 01/24/2019   Procedure: PARTIAL COLECTOMY;  Surgeon: Clovis Riley, MD;  Location: [facility];  Service: General;  Laterality: N/A;   PORTACATH PLACEMENT Right 02/28/2019   Procedure: INSERTION PORT-A-CATH WITH ULTRASOUND GUIDANCE;  Surgeon: Clovis Riley, MD;  Location: [facility];  Service: General;  Laterality: Right;    Social History:  reports that she  quit smoking about 2 years ago. Her smoking use included cigarettes. She has a 14.50 pack-year smoking history. She has never used smokeless tobacco. She reports that she does not currently use alcohol. She reports that she does not use drugs.  Allergies: No Known Allergies  Family History:  Family History  Problem Relation Age of Onset   Kidney failure Father    Hypertension Father    Hypertension Sister      Current Outpatient Medications:    acetaminophen (TYLENOL) 325 MG tablet, Take 2 tablets (650 mg total) by mouth every 6 (six) hours as needed., Disp: , Rfl:    amLODipine (NORVASC) 10 MG tablet, TAKE 1 TABLET BY MOUTH EVERY DAY, Disp: 90 tablet, Rfl: 1   benzonatate (TESSALON) 100 MG capsule, Take 1 capsule (100 mg total) by mouth every 8 (eight) hours., Disp: 21 capsule, Rfl: 0   capecitabine (XELODA) 500 MG tablet, Take 4 tablets (2,000 mg total) by mouth 2 (two) times daily after a meal. For 14 days, then off for 7 days., Disp: 112 tablet, Rfl: 2   ferrous sulfate 325 (65 FE) MG tablet, TAKE 1 TABLET BY MOUTH 2 TIMES DAILY WITH A MEAL., Disp: 60 tablet, Rfl: 5   gabapentin (NEURONTIN) 300 MG capsule, TAKE 1 CAPSULE BY MOUTH TWICE A DAY, Disp: 60 capsule, Rfl: 3   hydrALAZINE (APRESOLINE) 25 MG tablet, TAKE 1 TABLET BY MOUTH EVERY 8 HOURS, Disp: 30 tablet, Rfl: 0   hydrocortisone (ANUSOL-HC) 2.5 % rectal cream, Place 1 application rectally  2 (two) times daily., Disp: 30 g, Rfl: 0   Ibuprofen 200 MG CAPS, Take 400 mg by mouth daily as needed (pain)., Disp: , Rfl:    lidocaine (LMX) 4 % cream, Apply 1 application topically 3 (three) times daily as needed., Disp: 30 g, Rfl: 0   lisinopril (ZESTRIL) 40 MG tablet, Take 1 tablet (40 mg total) by mouth daily., Disp: 90 tablet, Rfl: 1   loperamide (IMODIUM) 2 MG capsule, TAKE 1 CAPSULE (2 MG TOTAL) BY MOUTH AS NEEDED FOR DIARRHEA OR LOOSE STOOLS., Disp: 90 capsule, Rfl: 0   metoprolol tartrate (LOPRESSOR) 50 MG tablet, TAKE 1 TABLET BY  MOUTH TWICE A DAY, Disp: 60 tablet, Rfl: 0   ondansetron (ZOFRAN ODT) 4 MG disintegrating tablet, 4mg  ODT q4 hours prn nausea/vomit, Disp: 20 tablet, Rfl: 0   ondansetron (ZOFRAN) 4 MG tablet, Take 1 tablet (4 mg total) by mouth every 6 (six) hours as needed for nausea., Disp: 30 tablet, Rfl: 0   polycarbophil (FIBERCON) 625 MG tablet, Take 1 tablet (625 mg total) by mouth daily., Disp: 30 tablet, Rfl: 0   hydrochlorothiazide (HYDRODIURIL) 12.5 MG tablet, Take 1 tablet (12.5 mg total) by mouth daily., Disp: 90 tablet, Rfl: 1   isosorbide mononitrate (IMDUR) 30 MG 24 hr tablet, Take 1 tablet (30 mg total) by mouth daily., Disp: 30 tablet, Rfl: 0 No current facility-administered medications for this visit.  Facility-Administered Medications Ordered in Other Visits:    sodium chloride flush (NS) 0.9 % injection 10 mL, 10 mL, Intracatheter, PRN, Truitt Merle, MD, 10 mL at 06/06/20 1051  Review of Systems:  Constitutional: Denies fever, chills, diaphoresis, appetite change.  HEENT: Denies photophobia, eye pain, redness, hearing loss, ear pain, congestion, sore throat, rhinorrhea, sneezing, mouth sores, trouble swallowing, neck pain, neck stiffness and tinnitus.   Respiratory: Denies SOB, DOE, cough, chest tightness,  and wheezing.   Cardiovascular: Denies chest pain, palpitations and leg swelling.  Gastrointestinal: Denies nausea, vomiting, abdominal pain, diarrhea, constipation, blood in stool and abdominal distention.  Genitourinary: Denies dysuria, urgency, frequency, hematuria, flank pain and difficulty urinating.  Endocrine: Denies: hot or cold intolerance, sweats, changes in hair or nails, polyuria, polydipsia. Musculoskeletal: Denies myalgias, back pain, joint swelling, arthralgias and gait problem.  Skin: Denies pallor, rash and wound.  Neurological: Denies dizziness, seizures, syncope,  light-headedness, numbness and headaches.  Hematological: Denies adenopathy. Easy bruising, personal or  family bleeding history  Psychiatric/Behavioral: Denies suicidal ideation, mood changes, confusion, nervousness, sleep disturbance and agitation    Physical Exam: Vitals:   05/07/21 1424  BP: 134/72  Pulse: (!) 59  Temp: 98.4 F (36.9 C)  TempSrc: Oral  SpO2: 100%  Weight: 240 lb 3.2 oz (109 kg)  Height: 5\' 7"  (1.702 m)    Body mass index is 37.62 kg/m.   Constitutional: NAD, calm, comfortable Eyes: PERRL, lids and conjunctivae normal ENMT: Mucous membranes are moist. Posterior pharynx clear of any exudate or lesions. Normal dentition. Tympanic membrane is pearly white, no erythema or bulging. Neck: normal, supple, no masses, no thyromegaly Respiratory: clear to auscultation bilaterally, no wheezing, no crackles. Normal respiratory effort. No accessory muscle use.  Cardiovascular: Regular rate and rhythm, no murmurs / rubs / gallops. No extremity edema. 2+ pedal pulses. No carotid bruits.  Abdomen: no tenderness, no masses palpated. No hepatosplenomegaly. Bowel sounds positive.  Colostomy bag present Musculoskeletal: no clubbing / cyanosis. No joint deformity upper and lower extremities. Good ROM, no contractures. Normal muscle tone.  Skin: no rashes, lesions,  ulcers. No induration Neurologic: CN 2-12 grossly intact. Sensation intact, DTR normal. Strength 5/5 in all 4.  Psychiatric: Normal judgment and insight. Alert and oriented x 3. Normal mood.    Impression and Plan:  Encounter for preventive health examination  -Recommend routine eye and dental care. -Immunizations: Tdap in office today, overdue for shingles and COVID booster, she would like to discuss these with her oncologist first -Healthy lifestyle discussed in detail. -Labs to be updated today. -Colon cancer screening: On active treatment for colon cancer -Breast cancer screening: 09/2020 -Cervical cancer screening: 01/2020 -Lung cancer screening: Not applicable -Prostate cancer screening: Not applicable -DEXA:  Not applicable  Primary hypertension -Well-controlled.  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  Need for Tdap vaccination -Tdap administered today.  Onychomycosis due to dermatophyte  - Plan: Ambulatory referral to Podiatry    Patient Instructions  -Nice seeing you today!!  -Lab work today; will notify you once results are available.  -Tdap vaccine today.  -Remember your COVID booster at the pharmacy.  -Will send you to see a foot doctor.  -Schedule follow up in 6 months or sooner as needed.      Lelon Frohlich, MD [facility]

## 2021-05-08 ENCOUNTER — Other Ambulatory Visit (HOSPITAL_COMMUNITY): Payer: Self-pay

## 2021-05-08 LAB — TSH: TSH: 2.92 u[IU]/mL (ref 0.35–5.50)

## 2021-05-08 LAB — LIPID PANEL
Cholesterol: 158 mg/dL (ref 0–200)
HDL: 59.6 mg/dL (ref >39.00)
LDL Cholesterol: 73 mg/dL (ref 0–99)
NonHDL: 98.63
Total CHOL/HDL Ratio: 3
Triglycerides: 126 mg/dL (ref 0.0–149.0)
VLDL: 25.2 mg/dL (ref 0.0–40.0)

## 2021-05-08 LAB — VITAMIN D 25 HYDROXY (VIT D DEFICIENCY, FRACTURES): VITD: 13.74 ng/mL — ABNORMAL LOW (ref 30.00–100.00)

## 2021-05-08 LAB — HEMOGLOBIN A1C: Hgb A1c MFr Bld: 5.5 % (ref 4.6–6.5)

## 2021-05-08 LAB — VITAMIN B12: Vitamin B-12: 418 pg/mL (ref 211–911)

## 2021-05-11 ENCOUNTER — Inpatient Hospital Stay: Payer: Medicaid Other

## 2021-05-11 ENCOUNTER — Encounter: Payer: Self-pay | Admitting: Hematology

## 2021-05-11 ENCOUNTER — Other Ambulatory Visit: Payer: Self-pay

## 2021-05-11 ENCOUNTER — Inpatient Hospital Stay: Payer: Medicaid Other | Attending: Hematology

## 2021-05-11 ENCOUNTER — Inpatient Hospital Stay: Payer: Medicaid Other | Admitting: Hematology

## 2021-05-11 ENCOUNTER — Other Ambulatory Visit (HOSPITAL_COMMUNITY): Payer: Self-pay

## 2021-05-11 VITALS — BP 133/61 | HR 55 | Temp 98.6°F | Resp 18 | Ht 67.0 in | Wt 239.2 lb

## 2021-05-11 DIAGNOSIS — C189 Malignant neoplasm of colon, unspecified: Secondary | ICD-10-CM

## 2021-05-11 DIAGNOSIS — C184 Malignant neoplasm of transverse colon: Secondary | ICD-10-CM | POA: Diagnosis present

## 2021-05-11 DIAGNOSIS — D500 Iron deficiency anemia secondary to blood loss (chronic): Secondary | ICD-10-CM

## 2021-05-11 DIAGNOSIS — I10 Essential (primary) hypertension: Secondary | ICD-10-CM | POA: Diagnosis not present

## 2021-05-11 DIAGNOSIS — Z5112 Encounter for antineoplastic immunotherapy: Secondary | ICD-10-CM | POA: Diagnosis present

## 2021-05-11 DIAGNOSIS — C787 Secondary malignant neoplasm of liver and intrahepatic bile duct: Secondary | ICD-10-CM | POA: Diagnosis not present

## 2021-05-11 DIAGNOSIS — Z95828 Presence of other vascular implants and grafts: Secondary | ICD-10-CM

## 2021-05-11 LAB — CBC WITH DIFFERENTIAL/PLATELET
Abs Immature Granulocytes: 0.01 10*3/uL (ref 0.00–0.07)
Basophils Absolute: 0 10*3/uL (ref 0.0–0.1)
Basophils Relative: 1 %
Eosinophils Absolute: 0.2 10*3/uL (ref 0.0–0.5)
Eosinophils Relative: 3 %
HCT: 35 % — ABNORMAL LOW (ref 36.0–46.0)
Hemoglobin: 11.2 g/dL — ABNORMAL LOW (ref 12.0–15.0)
Immature Granulocytes: 0 %
Lymphocytes Relative: 33 %
Lymphs Abs: 2.3 10*3/uL (ref 0.7–4.0)
MCH: 33.7 pg (ref 26.0–34.0)
MCHC: 32 g/dL (ref 30.0–36.0)
MCV: 105.4 fL — ABNORMAL HIGH (ref 80.0–100.0)
Monocytes Absolute: 0.6 10*3/uL (ref 0.1–1.0)
Monocytes Relative: 8 %
Neutro Abs: 3.9 10*3/uL (ref 1.7–7.7)
Neutrophils Relative %: 55 %
Platelets: 173 10*3/uL (ref 150–400)
RBC: 3.32 MIL/uL — ABNORMAL LOW (ref 3.87–5.11)
RDW: 16.8 % — ABNORMAL HIGH (ref 11.5–15.5)
WBC: 7 10*3/uL (ref 4.0–10.5)
nRBC: 0 % (ref 0.0–0.2)

## 2021-05-11 LAB — COMPREHENSIVE METABOLIC PANEL
ALT: 8 U/L (ref 0–44)
AST: 15 U/L (ref 15–41)
Albumin: 3.7 g/dL (ref 3.5–5.0)
Alkaline Phosphatase: 101 U/L (ref 38–126)
Anion gap: 5 (ref 5–15)
BUN: 25 mg/dL — ABNORMAL HIGH (ref 8–23)
CO2: 24 mmol/L (ref 22–32)
Calcium: 9.3 mg/dL (ref 8.9–10.3)
Chloride: 108 mmol/L (ref 98–111)
Creatinine, Ser: 1.07 mg/dL — ABNORMAL HIGH (ref 0.44–1.00)
GFR, Estimated: 59 mL/min — ABNORMAL LOW (ref >60)
Glucose, Bld: 91 mg/dL (ref 70–99)
Potassium: 4.5 mmol/L (ref 3.5–5.1)
Sodium: 137 mmol/L (ref 135–145)
Total Bilirubin: 0.5 mg/dL (ref 0.3–1.2)
Total Protein: 7.5 g/dL (ref 6.5–8.1)

## 2021-05-11 LAB — CEA (IN HOUSE-CHCC): CEA (CHCC-In House): 247.33 ng/mL — ABNORMAL HIGH (ref 0.00–5.00)

## 2021-05-11 LAB — TOTAL PROTEIN, URINE DIPSTICK: Protein, ur: NEGATIVE mg/dL

## 2021-05-11 MED ORDER — SODIUM CHLORIDE 0.9 % IV SOLN
7.5000 mg/kg | Freq: Once | INTRAVENOUS | Status: AC
  Administered 2021-05-11: 800 mg via INTRAVENOUS
  Filled 2021-05-11: qty 32

## 2021-05-11 MED ORDER — SODIUM CHLORIDE 0.9% FLUSH
10.0000 mL | INTRAVENOUS | Status: DC | PRN
  Administered 2021-05-11: 10 mL

## 2021-05-11 MED ORDER — CAPECITABINE 500 MG PO TABS
2000.0000 mg | ORAL_TABLET | Freq: Two times a day (BID) | ORAL | 2 refills | Status: DC
  Filled 2021-05-27: qty 112, 21d supply, fill #0
  Filled 2021-06-18: qty 112, 21d supply, fill #1
  Filled 2021-07-03: qty 112, 21d supply, fill #2

## 2021-05-11 MED ORDER — SODIUM CHLORIDE 0.9 % IV SOLN: Freq: Once | INTRAVENOUS | Status: AC

## 2021-05-11 MED ORDER — HEPARIN SOD (PORK) LOCK FLUSH 100 UNIT/ML IV SOLN
500.0000 [IU] | Freq: Once | INTRAVENOUS | Status: AC | PRN
  Administered 2021-05-11: 500 [IU]

## 2021-05-11 NOTE — Patient Instructions (Signed)
[facility]  Discharge Instructions: °Thank you for choosing [facility] to provide your oncology and hematology care.  ° °If you have a lab appointment with the Cancer Center, please go directly to the Cancer Center and check in at the registration area. °  °Wear comfortable clothing and clothing appropriate for easy access to any Portacath or PICC line.  ° °We strive to give you quality time with your provider. You may need to reschedule your appointment if you arrive late (15 or more minutes).  Arriving late affects you and other patients whose appointments are after yours.  Also, if you miss three or more appointments without notifying the office, you may be dismissed from the clinic at the provider’s discretion.    °  °For prescription refill requests, have your pharmacy contact our office and allow 72 hours for refills to be completed.   ° °Today you received the following chemotherapy and/or immunotherapy agents: bevacizumab    °  °To help prevent nausea and vomiting after your treatment, we encourage you to take your nausea medication as directed. ° °BELOW ARE SYMPTOMS THAT SHOULD BE REPORTED IMMEDIATELY: °*FEVER GREATER THAN 100.4 F (38 °C) OR HIGHER °*CHILLS OR SWEATING °*NAUSEA AND VOMITING THAT IS NOT CONTROLLED WITH YOUR NAUSEA MEDICATION °*UNUSUAL SHORTNESS OF BREATH °*UNUSUAL BRUISING OR BLEEDING °*URINARY PROBLEMS (pain or burning when urinating, or frequent urination) °*BOWEL PROBLEMS (unusual diarrhea, constipation, pain near the anus) °TENDERNESS IN MOUTH AND THROAT WITH OR WITHOUT PRESENCE OF ULCERS (sore throat, sores in mouth, or a toothache) °UNUSUAL RASH, SWELLING OR PAIN  °UNUSUAL VAGINAL DISCHARGE OR ITCHING  ° °Items with * indicate a potential emergency and should be followed up as soon as possible or go to the Emergency Department if any problems should occur. ° °Please show the CHEMOTHERAPY ALERT CARD or IMMUNOTHERAPY ALERT CARD at check-in  to the Emergency Department and triage nurse. ° °Should you have questions after your visit or need to cancel or reschedule your appointment, please contact [facility]  Dept: [phone]  and follow the prompts.  Office hours are 8:00 a.m. to 4:30 p.m. Monday - Friday. Please note that voicemails left after 4:00 p.m. may not be returned until the following business day.  We are closed weekends and major holidays. You have access to a nurse at all times for urgent questions. Please call the main number to the clinic Dept: [phone] and follow the prompts. ° ° °For any non-urgent questions, you may also contact your provider using MyChart. We now offer e-Visits for anyone 18 and older to request care online for non-urgent symptoms. For details visit [email or web address]. °  °Also download the MyChart app! Go to the app store, search "MyChart", open the app, select [facility], and log in with your MyChart username and password. ° °Due to Covid, a mask is required upon entering the hospital/clinic. If you do not have a mask, one will be given to you upon arrival. For doctor visits, patients may have 1 support person aged 18 or older with them. For treatment visits, patients cannot have anyone with them due to current Covid guidelines and our immunocompromised population.  ° °

## 2021-05-11 NOTE — Progress Notes (Signed)
Casey Black   [phone] Fax:([phone]) [phone]   Clinic Follow up Note   Patient Care Team: [name], [name], MD as PCP - General (Internal Medicine) Clovis Riley, MD as Consulting Physician (General Surgery) Truitt Merle, MD as Consulting Physician (Hematology) [name], NP as Nurse Practitioner (Nurse Practitioner)  Date of Service:  05/11/2021  CHIEF COMPLAINT: f/u of metastatic colon cancer  CURRENT THERAPY:  Maintenance Avastin and Xeloda 201m q12h on day 1-14 every 3 weeks   ASSESSMENT & PLAN:  Casey Grossois a 61y.o. female with   1. 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 -PET from 02/26/19 shows known liver metastasis and metastatic lymphadenopathy in chest and right axilla. -[name] shows MSI stable disease, Kras+ No targetable mutation, she is not a candidate for EGFR or immunotherapy. -Began palliative first line chemo on 03/12/2019, received 5FU/leuc with first 2 cycles for large open abdominal wound after surgery. She started full dose FOLFOX and avastin with cycle 2. Due to neuropathy Oxaliplatin was stopped after 09/24/19, she has been on maintenance therapy. -For convenience she was switched to oral chemo Xeloda 2 weeks on/1 week off and Bev every 3 weeks in 01/21/2020.  Treatment held 05/01/20 - 05/12/20 due to COVID infection -restaging CT 02/13/21 showed stable liver mets, no new lesions or evidence of progression. Will plan for repeat 05/2021. -She is clinically doing well. Labs reviewed, overall stable. Continue Xeloda and bevacizumab maintenance therapy. She is tolerating very well with only mild skin toxicity.   2. Chemotherapy induced peripheral neuropathy (CIPN), G1 -secondary to  Oxaliplatin, developed after cycle 12 FOLFOX. Oxaliplatin d/c after C15 on 09/24/19 -Neuropathy in fingers and feet are moderate and stable with mild decreased function in fingers.  -She is currently on gabapentin 100 mg AM and 300 mg qHS and titrate up if needed.  -overall improved, no impact on her function now  -Will continue to monitor.    3. 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 Will give IV iron if needed  -Mild and stable anemia lately. Hgb 11.2 today (05/11/21)   4. HTN, uncontrolled  -on hydralazine, isosorbide, lisinopril, metoprolol, amlodipine and HCTZ  -Will monitor on bevacizumab.    5. Goals of care discussion, Social support -The patient understands the goal of care is palliative. She is full code now  -She worked in [employer] 2 different jobs but has been on disability since her diagnosis.   9. COVID (+) 05/01/20 -She received treatment infusion on 05/06/20. Symptoms overall resolved after treatment.      PLAN: -proceed with bevacizumab today, she has started this cycle Xeloda on 2/4 -lab, flush, f/u, and beva in 3 and 6 weeks -restaging CT to be done in 2-3 weeks before next cycle   No problem-specific Assessment & Plan notes found for this encounter.   SUMMARY OF ONCOLOGIC HISTORY: Oncology History Overview Note  Cancer Staging Adenocarcinoma of colon metastatic to liver ([facility] Staging form: Colon and Rectum, AJCC 8th Edition - Pathologic stage from 01/24/2019: Stage IVA (pT4a, pN1a, pM1a) - Signed by BAlla Feeling NP on 02/14/2019    [place]  01/23/2019 Imaging   ABD  Xray IMPRESSION: 1. Bowel-gas pattern consistent with small bowel obstruction. No free air. 2. No acute chest findings.   01/23/2019 Imaging   CT AP IMPRESSION: Obstructing mid transverse colonic mass with mild regional adenopathy and hepatic  metastatic disease. The mass likely extends through the serosa; no ascites or peritoneal nodularity.   01/24/2019 Surgery   Surgeon: Clovis Riley MD Assistant: [name] Procedure performed: Transverse colectomy with end colostomy, liver biopsy Procedure classification: URGENT/EMERGENT Preop diagnosis: Obstructing, metastatic transverse colon mass Post-op diagnosis/intraop findings: Same   01/24/2019 Pathology Results   FINAL MICROSCOPIC DIAGNOSIS:   A. COLON, TRANSVERSE, RESECTION:  Colonic adenocarcinoma, 5 cm.  Carcinoma extends into pericolonic connective tissue and focally to  serosal surface.  Margins not involved.  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   B. LIVER NODULE, LEFT, BIOPSY:  Metastatic adenocarcinoma.    01/24/2019 Cancer Staging   Staging form: Colon and Rectum, AJCC 8th Edition - Pathologic stage from 01/24/2019: Stage IVA (pT4a, pN1a, pM1a) - Signed by [name], NP on 02/14/2019    02/02/2019 Initial Diagnosis   Adenocarcinoma of colon metastatic to liver ([place])   02/26/2019 PET scan   IMPRESSION: 1. Hypermetabolic metastatic disease in the liver and mediastinal/hilar/axillary lymph nodes. 2. Focal hypermetabolism in the rectum. Continued attention on follow-up exams is warranted. 3. Focal hypermetabolism medial to the right adrenal gland may be within a metastatic lymph node, better visualized on 01/23/2019. 4. Aortic atherosclerosis (ICD10-170.0). Coronary artery calcification.   03/12/2019 -  Chemotherapy   She started 5FU q2weeks on 03/12/19 for 2 cycles. She started full dose FOLFOX with Avastin on 04/09/19. Oxaliplatin dose reduced repeatedly due to neuropathy C12 and held since C16 on 10/09/19. Now on maintenance Avastin and 5FU q2weeks since 10/09/19       -Maintenance change to maintenance xeloda 2000 mg BID days 1-14 q21 days and q3 weeks Zirabev (15 mg/kg) starting 01/21/20. First cycle was taken 1083m BID  due to misunderstanding.    05/31/2019 Imaging   Restaging CT CAP IMPRESSION: 1. Similar to mild interval decrease in size of multiple hepatic lesions, partially calcified. 2. 2 mm right upper lobe pulmonary nodule. Recommend attention on follow-up. 3. Emphysema and aortic atherosclerosis.   08/23/2019 Imaging   CT CAP w contrast  IMPRESSION: 1. The dominant peripheral right liver metastasis has mildly increased. Other smaller liver metastases are stable. 2. Otherwise no new or progressive metastatic disease in the chest, abdomen or pelvis. 3. Aortic Atherosclerosis (ICD10-I70.0) and Emphysema (ICD10-J43.9).   11/27/2019 Imaging   CT CAP w contrast  IMPRESSION: Status post transverse colectomy with right mid abdominal colostomy.   Mildly progressive hepatic metastases, as above.   No evidence of metastatic disease in the chest. Small mediastinal lymph nodes are within normal limits.   Additional stable ancillary findings as above.   02/21/2020 Imaging   IMPRESSION: 1. Stable hepatic metastatic disease. 2. Aortic atherosclerosis (ICD10-I70.0). Coronary artery calcification. 3.  Emphysema (ICD10-J43.9).   05/19/2020 Imaging   CT CAP  IMPRESSION: 1. Multiple partially calcified liver metastases are again noted. With the exception of a small lesion in segment 7/8 lesions are not significantly changed in the interval. No new liver lesions identified. 2. Coronary artery atherosclerotic calcifications. 3. Aortic atherosclerosis. 4. 2 mm right upper lobe lung nodule identified.  Unchanged.   Aortic Atherosclerosis (ICD10-I70.0).   11/17/2020 Imaging   CT CAP  IMPRESSION: 1. Partially calcified lesions throughout the liver are stable accounting for differences  in technique, contrasted imaging on today's study is compared to noncontrast imaging on the prior. 2. No new hepatic lesions. 3. Tiny 3 mm pulmonary nodule in the RIGHT upper lobe unchanged since the prior study.  Attention on follow-up. 4. Mild fullness of RIGHT paratracheal nodal tissue is minimally increased and borderline enlarged, attention on follow-up. 5. RIGHT lower quadrant colostomy. 6. 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 7. Aortic atherosclerosis.      INTERVAL HISTORY:  Casey Black is here for a follow up of metastatic colon cancer. She was last seen by me on 04/20/21. She presents to the clinic alone. She reports she is doing well overall, no new concerns. She notes continued skin darkening to her hands and feet, no skin breakdown.   All other systems were reviewed with the patient and are negative.  MEDICAL HISTORY:  Past Medical History:  Diagnosis Date   Anemia    low iron   Cancer (HCC)    colon cancer   Colon cancer ([place]) 01/2019   Hypertension 01/23/2019   Personal history of chemotherapy 01/2019   colon CA   SBO (small bowel obstruction) ([place]) 01/23/2019    SURGICAL HISTORY: Past Surgical History:  Procedure Laterality Date   CESAREAN SECTION     x2   COLOSTOMY N/A 01/24/2019   Procedure: End Loop Colostomy;  Surgeon: Clovis Riley, MD;  Location: [facility];  Service: General;  Laterality: N/A;   PARTIAL COLECTOMY N/A 01/24/2019   Procedure: PARTIAL COLECTOMY;  Surgeon: Clovis Riley, MD;  Location: [facility];  Service: General;  Laterality: N/A;   PORTACATH PLACEMENT Right 02/28/2019   Procedure: INSERTION PORT-A-CATH WITH ULTRASOUND GUIDANCE;  Surgeon: Clovis Riley, MD;  Location: [facility];  Service: General;  Laterality: Right;    I have reviewed the social history and family history with the patient and they are unchanged from previous note.  ALLERGIES:  has No Known Allergies.  MEDICATIONS:  Current Outpatient Medications  Medication Sig Dispense Refill   acetaminophen (TYLENOL) 325 MG tablet Take 2 tablets (650 mg total)  by mouth every 6 (six) hours as needed.     amLODipine (NORVASC) 10 MG tablet TAKE 1 TABLET BY MOUTH EVERY DAY 90 tablet 1   benzonatate (TESSALON) 100 MG capsule Take 1 capsule (100 mg total) by mouth every 8 (eight) hours. 21 capsule 0   capecitabine (XELODA) 500 MG tablet Take 4 tablets (2,000 mg total) by mouth 2 (two) times daily after a meal. For 14 days, then off for 7 days. 112 tablet 2   ferrous sulfate 325 (65 FE) MG tablet TAKE 1 TABLET BY MOUTH 2 TIMES DAILY WITH A MEAL. 60 tablet 5   gabapentin (NEURONTIN) 300 MG capsule TAKE 1 CAPSULE BY MOUTH TWICE A DAY 60 capsule 3   hydrALAZINE (APRESOLINE) 25 MG tablet TAKE 1 TABLET BY MOUTH EVERY 8 HOURS 30 tablet 0   hydrochlorothiazide (HYDRODIURIL) 12.5 MG tablet Take 1 tablet (12.5 mg total) by mouth daily. 90 tablet 1   hydrocortisone (ANUSOL-HC) 2.5 % rectal cream Place 1 application rectally 2 (two) times daily. 30 g 0   Ibuprofen 200 MG CAPS Take 400 mg by mouth daily as needed (pain).     isosorbide mononitrate (IMDUR) 30 MG 24 hr tablet Take 1 tablet (30 mg total) by mouth daily. 30 tablet 0   lidocaine (LMX) 4 %  cream Apply 1 application topically 3 (three) times daily as needed. 30 g 0   lisinopril (ZESTRIL) 40 MG tablet Take 1 tablet (40 mg total) by mouth daily. 90 tablet 1   loperamide (IMODIUM) 2 MG capsule TAKE 1 CAPSULE (2 MG TOTAL) BY MOUTH AS NEEDED FOR DIARRHEA OR LOOSE STOOLS. 90 capsule 0   metoprolol tartrate (LOPRESSOR) 50 MG tablet TAKE 1 TABLET BY MOUTH TWICE A DAY 60 tablet 0   ondansetron (ZOFRAN ODT) 4 MG disintegrating tablet 47m ODT q4 hours prn nausea/vomit 20 tablet 0   ondansetron (ZOFRAN) 4 MG tablet Take 1 tablet (4 mg total) by mouth every 6 (six) hours as needed for nausea. 30 tablet 0   polycarbophil (FIBERCON) 625 MG tablet Take 1 tablet (625 mg total) by mouth daily. 30 tablet 0   No current facility-administered medications for this visit.   Facility-Administered Medications Ordered in Other Visits   Medication Dose Route Frequency Provider Last Rate Last Admin   sodium chloride flush (NS) 0.9 % injection 10 mL  10 mL Intracatheter PRN FTruitt Merle MD   10 mL at 06/06/20 1051    PHYSICAL EXAMINATION: ECOG PERFORMANCE STATUS: 1 - Symptomatic but completely ambulatory  Vitals:   05/11/21 1231  BP: 133/61  Pulse: (!) 55  Resp: 18  Temp: 98.6 F (37 C)  SpO2: 100%   Wt Readings from Last 3 Encounters:  05/11/21 239 lb 3.2 oz (108.5 kg)  05/07/21 240 lb 3.2 oz (109 kg)  04/20/21 236 lb 8 oz (107.3 kg)     GENERAL:alert, no distress and comfortable SKIN: skin color normal, no rashes or significant lesions EYES: normal, Conjunctiva are pink and non-injected, sclera clear  NEURO: alert & oriented x 3 with fluent speech  LABORATORY DATA:  I have reviewed the data as listed CBC Latest Ref Rng & Units 05/11/2021 04/20/2021 03/31/2021  WBC 4.0 - 10.5 K/uL 7.0 7.0 5.6  Hemoglobin 12.0 - 15.0 g/dL 11.2(L) 11.6(L) 11.3(L)  Hematocrit 36.0 - 46.0 % 35.0(L) 35.6(L) 35.3(L)  Platelets 150 - 400 K/uL 173 178 136(L)     CMP Latest Ref Rng & Units 05/11/2021 04/20/2021 03/31/2021  Glucose 70 - 99 mg/dL 91 91 119(H)  BUN 8 - 23 mg/dL 25(H) 22 34(H)  Creatinine 0.44 - 1.00 mg/dL 1.07(H) 1.00 1.15(H)  Sodium 135 - 145 mmol/L 137 135 135  Potassium 3.5 - 5.1 mmol/L 4.5 4.8 5.0  Chloride 98 - 111 mmol/L 108 108 111  CO2 22 - 32 mmol/L 24 23 19(L)  Calcium 8.9 - 10.3 mg/dL 9.3 9.8 9.4  Total Protein 6.5 - 8.1 g/dL 7.5 8.1 7.5  Total Bilirubin 0.3 - 1.2 mg/dL 0.5 0.5 0.6  Alkaline Phos 38 - 126 U/L 101 99 82  AST 15 - 41 U/L _0 ALT 0 - 44 U/L _1 RADIOGRAPHIC STUDIES: I have personally reviewed the radiological images as listed and agreed with the findings in the report. No results found.    Orders Placed This Encounter  Procedures   CT CHEST ABDOMEN PELVIS W CONTRAST    Standing Status:   Future    Standing Expiration Date:   05/11/2022    Order Specific Question:    [facility]    [facility]   Order Specific Question:   Is Oral Contrast requested for this exam?    Answer:   Yes, Per Radiology protocol  All questions were answered. The patient knows to call the clinic with any problems, questions or concerns. No barriers to learning was detected.      Truitt Merle, MD 05/11/2021   I, Wilburn Mylar, am acting as scribe for Truitt Merle, MD.   I have reviewed the above documentation for accuracy and completeness, and I agree with the above.

## 2021-05-12 ENCOUNTER — Other Ambulatory Visit: Payer: Self-pay | Admitting: Internal Medicine

## 2021-05-12 ENCOUNTER — Encounter: Payer: Self-pay | Admitting: Internal Medicine

## 2021-05-12 DIAGNOSIS — E559 Vitamin D deficiency, unspecified: Secondary | ICD-10-CM

## 2021-05-12 MED ORDER — VITAMIN D (ERGOCALCIFEROL) 1.25 MG (50000 UNIT) PO CAPS: 50000.0000 [IU] | ORAL_CAPSULE | ORAL | 0 refills | Status: AC

## 2021-05-13 ENCOUNTER — Telehealth: Payer: Self-pay | Admitting: Hematology

## 2021-05-13 NOTE — Telephone Encounter (Signed)
Left message with follow-up appointments per 2/6 los. °

## 2021-05-27 ENCOUNTER — Other Ambulatory Visit (HOSPITAL_COMMUNITY): Payer: Self-pay

## 2021-05-27 DIAGNOSIS — Z933 Colostomy status: Secondary | ICD-10-CM | POA: Diagnosis not present

## 2021-05-27 DIAGNOSIS — S31109A Unspecified open wound of abdominal wall, unspecified quadrant without penetration into peritoneal cavity, initial encounter: Secondary | ICD-10-CM | POA: Diagnosis not present

## 2021-05-27 DIAGNOSIS — K56609 Unspecified intestinal obstruction, unspecified as to partial versus complete obstruction: Secondary | ICD-10-CM | POA: Diagnosis not present

## 2021-05-27 DIAGNOSIS — T8131XD Disruption of external operation (surgical) wound, not elsewhere classified, subsequent encounter: Secondary | ICD-10-CM | POA: Diagnosis not present

## 2021-05-28 ENCOUNTER — Other Ambulatory Visit: Payer: Self-pay

## 2021-05-28 ENCOUNTER — Encounter: Payer: Self-pay | Admitting: Podiatry

## 2021-05-28 ENCOUNTER — Ambulatory Visit: Payer: Medicaid Other | Admitting: Podiatry

## 2021-05-28 DIAGNOSIS — L84 Corns and callosities: Secondary | ICD-10-CM

## 2021-05-28 DIAGNOSIS — G620 Drug-induced polyneuropathy: Secondary | ICD-10-CM

## 2021-05-28 DIAGNOSIS — M79674 Pain in right toe(s): Secondary | ICD-10-CM

## 2021-05-28 DIAGNOSIS — B351 Tinea unguium: Secondary | ICD-10-CM

## 2021-05-28 DIAGNOSIS — M79675 Pain in left toe(s): Secondary | ICD-10-CM

## 2021-05-28 DIAGNOSIS — L309 Dermatitis, unspecified: Secondary | ICD-10-CM

## 2021-05-29 ENCOUNTER — Other Ambulatory Visit (HOSPITAL_COMMUNITY): Payer: Self-pay

## 2021-05-29 ENCOUNTER — Inpatient Hospital Stay: Payer: Medicaid Other

## 2021-05-29 DIAGNOSIS — Z5112 Encounter for antineoplastic immunotherapy: Secondary | ICD-10-CM | POA: Diagnosis not present

## 2021-05-29 DIAGNOSIS — C787 Secondary malignant neoplasm of liver and intrahepatic bile duct: Secondary | ICD-10-CM

## 2021-05-29 DIAGNOSIS — C189 Malignant neoplasm of colon, unspecified: Secondary | ICD-10-CM

## 2021-05-29 DIAGNOSIS — Z95828 Presence of other vascular implants and grafts: Secondary | ICD-10-CM

## 2021-05-29 LAB — COMPREHENSIVE METABOLIC PANEL
ALT: 9 U/L (ref 0–44)
AST: 16 U/L (ref 15–41)
Albumin: 3.8 g/dL (ref 3.5–5.0)
Alkaline Phosphatase: 87 U/L (ref 38–126)
Anion gap: 4 — ABNORMAL LOW (ref 5–15)
BUN: 18 mg/dL (ref 8–23)
CO2: 25 mmol/L (ref 22–32)
Calcium: 9.9 mg/dL (ref 8.9–10.3)
Chloride: 108 mmol/L (ref 98–111)
Creatinine, Ser: 0.89 mg/dL (ref 0.44–1.00)
GFR, Estimated: >60 mL/min (ref >60)
Glucose, Bld: 84 mg/dL (ref 70–99)
Potassium: 4.4 mmol/L (ref 3.5–5.1)
Sodium: 137 mmol/L (ref 135–145)
Total Bilirubin: 0.6 mg/dL (ref 0.3–1.2)
Total Protein: 7.8 g/dL (ref 6.5–8.1)

## 2021-05-29 LAB — CBC WITH DIFFERENTIAL/PLATELET
Abs Immature Granulocytes: 0.02 10*3/uL (ref 0.00–0.07)
Basophils Absolute: 0 10*3/uL (ref 0.0–0.1)
Basophils Relative: 0 %
Eosinophils Absolute: 0.2 10*3/uL (ref 0.0–0.5)
Eosinophils Relative: 3 %
HCT: 35.5 % — ABNORMAL LOW (ref 36.0–46.0)
Hemoglobin: 11.7 g/dL — ABNORMAL LOW (ref 12.0–15.0)
Immature Granulocytes: 0 %
Lymphocytes Relative: 30 %
Lymphs Abs: 2 10*3/uL (ref 0.7–4.0)
MCH: 34.5 pg — ABNORMAL HIGH (ref 26.0–34.0)
MCHC: 33 g/dL (ref 30.0–36.0)
MCV: 104.7 fL — ABNORMAL HIGH (ref 80.0–100.0)
Monocytes Absolute: 0.6 10*3/uL (ref 0.1–1.0)
Monocytes Relative: 8 %
Neutro Abs: 3.9 10*3/uL (ref 1.7–7.7)
Neutrophils Relative %: 59 %
Platelets: 179 10*3/uL (ref 150–400)
RBC: 3.39 MIL/uL — ABNORMAL LOW (ref 3.87–5.11)
RDW: 17.3 % — ABNORMAL HIGH (ref 11.5–15.5)
WBC: 6.7 10*3/uL (ref 4.0–10.5)
nRBC: 0 % (ref 0.0–0.2)

## 2021-05-29 LAB — CEA (IN HOUSE-CHCC): CEA (CHCC-In House): 242.77 ng/mL — ABNORMAL HIGH (ref 0.00–5.00)

## 2021-05-29 LAB — TOTAL PROTEIN, URINE DIPSTICK: Protein, ur: NEGATIVE mg/dL

## 2021-05-29 MED ORDER — SODIUM CHLORIDE 0.9% FLUSH
10.0000 mL | INTRAVENOUS | Status: DC | PRN
  Administered 2021-05-29: 10 mL

## 2021-05-29 MED ORDER — HEPARIN SOD (PORK) LOCK FLUSH 100 UNIT/ML IV SOLN
500.0000 [IU] | Freq: Once | INTRAVENOUS | Status: AC | PRN
  Administered 2021-05-29: 500 [IU]

## 2021-05-29 NOTE — Progress Notes (Signed)
Subjective:   Patient ID: Casey Black, female   DOB: 61 y.o.   MRN: [id number]   HPI Patient presents with nail disease 1-5 both feet that she cannot take care of with very poor health stage IV cancer and dermatitis of both feet with significant dry skin and chemo-induced neuropathic condition.  Patient does not currently smoke and tries to be active   Review of Systems  All other systems reviewed and are negative.      Objective:  Physical Exam Vitals and nursing note reviewed.  Constitutional:      Appearance: She is well-developed.  Pulmonary:     Effort: Pulmonary effort is normal.  Musculoskeletal:        General: Normal range of motion.  Skin:    General: Skin is warm.  Neurological:     Mental Status: She is alert.    Neurovascular status found to be diminished both PT and DP pulses with very dry skin with irritative dermatitis with thick incurvated nail beds that are very hard for her to cut also with neuropathic condition secondary to chemo.     Assessment:  Significant health conditions with nail disease that she cannot take care of and dermatitis of her skin     Plan:  H&P reviewed conditions debrided nailbeds 1-5 both feet recommended using creams for her feet and no current treatment for chemo related neuropathic condition.  No iatrogenic bleeding was noted can be done as needed

## 2021-05-31 ENCOUNTER — Other Ambulatory Visit: Payer: Self-pay | Admitting: Internal Medicine

## 2021-05-31 DIAGNOSIS — Z0000 Encounter for general adult medical examination without abnormal findings: Secondary | ICD-10-CM

## 2021-06-01 ENCOUNTER — Inpatient Hospital Stay: Payer: Medicaid Other | Admitting: Hematology

## 2021-06-01 ENCOUNTER — Encounter: Payer: Self-pay | Admitting: Hematology

## 2021-06-01 ENCOUNTER — Inpatient Hospital Stay: Payer: Medicaid Other

## 2021-06-01 ENCOUNTER — Other Ambulatory Visit: Payer: Self-pay

## 2021-06-01 VITALS — BP 137/53 | HR 51 | Temp 98.5°F | Resp 18 | Ht 67.0 in | Wt 243.3 lb

## 2021-06-01 DIAGNOSIS — C787 Secondary malignant neoplasm of liver and intrahepatic bile duct: Secondary | ICD-10-CM | POA: Diagnosis not present

## 2021-06-01 DIAGNOSIS — C189 Malignant neoplasm of colon, unspecified: Secondary | ICD-10-CM

## 2021-06-01 DIAGNOSIS — Z5112 Encounter for antineoplastic immunotherapy: Secondary | ICD-10-CM | POA: Diagnosis not present

## 2021-06-01 MED ORDER — SODIUM CHLORIDE 0.9 % IV SOLN: Freq: Once | INTRAVENOUS | Status: AC

## 2021-06-01 MED ORDER — HEPARIN SOD (PORK) LOCK FLUSH 100 UNIT/ML IV SOLN
500.0000 [IU] | Freq: Once | INTRAVENOUS | Status: AC | PRN
  Administered 2021-06-01: 500 [IU]

## 2021-06-01 MED ORDER — SODIUM CHLORIDE 0.9% FLUSH
10.0000 mL | INTRAVENOUS | Status: DC | PRN
  Administered 2021-06-01: 10 mL

## 2021-06-01 MED ORDER — LIDOCAINE-PRILOCAINE 2.5-2.5 % EX CREA: 1.0000 "application " | TOPICAL_CREAM | CUTANEOUS | 1 refills | Status: DC | PRN

## 2021-06-01 MED ORDER — SODIUM CHLORIDE 0.9 % IV SOLN
7.5000 mg/kg | Freq: Once | INTRAVENOUS | Status: AC
  Administered 2021-06-01: 800 mg via INTRAVENOUS
  Filled 2021-06-01: qty 32

## 2021-06-01 NOTE — Progress Notes (Signed)
[place]   [phone] [phone]) [phone]   Clinic Follow up Note   Patient Care Team: [name], [name], MD as PCP - General (Internal Medicine) Clovis Riley, MD as Consulting Physician (General Surgery) Truitt Merle, MD as Consulting Physician (Hematology) [name], NP as Nurse Practitioner (Nurse Practitioner)  Date of Service:  06/01/2021  CHIEF COMPLAINT: f/u of metastatic colon cancer  CURRENT THERAPY:  Maintenance Avastin and Xeloda 2067m q12h on day 1-14 every 3 weeks   ASSESSMENT & PLAN:  Casey Black a 61y.o. female with   1. 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 -PET from 02/26/19 shows known liver metastasis and metastatic lymphadenopathy in chest and right axilla. -[name] shows MSI stable disease, Kras+ No targetable mutation, she is not a candidate for EGFR or immunotherapy. -Began palliative first line chemo on 03/12/2019, received 5FU/leuc with first 2 cycles for large open abdominal wound after surgery. She started full dose FOLFOX and avastin with cycle 2. Due to neuropathy Oxaliplatin was stopped after 09/24/19, she has been on maintenance therapy. -For convenience she was switched to oral chemo Xeloda 2 weeks on/1 week off and Bev every 3 weeks in 01/21/2020.  Treatment held 05/01/20 - 05/12/20 due to COVID infection -restaging CT 02/13/21 showed stable liver mets, no new lesions or evidence of progression. Will plan for repeat in the next few weeks. -She is clinically doing well. Labs reviewed, overall stable. CEA has fluctuated but is also overall stable. Continue Xeloda and bevacizumab maintenance therapy. She is tolerating very well with only mild skin toxicity. -repeat CT in  2-3 weeks    2. Chemotherapy induced peripheral neuropathy (CIPN), G1 -secondary to Oxaliplatin, developed after cycle 12 FOLFOX. Oxaliplatin d/c after C15 on 09/24/19 -Neuropathy in fingers and feet are moderate and stable with mild decreased function in fingers.  -She is currently on gabapentin 100 mg AM and 300 mg qHS and titrate up if needed.  -overall improved, no impact on her function now  -Will continue to monitor.    3. 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 Will give IV iron if needed  -Mild and stable anemia lately. Hgb 11.7 on 05/29/21   4. HTN, uncontrolled  -on hydralazine, isosorbide, lisinopril, metoprolol, amlodipine and HCTZ  -Will monitor on bevacizumab.    5. Goals of care discussion, Social support -The patient understands the goal of care is palliative. She is full code now  -She worked in [employer] 2 different jobs but has been on disability since her diagnosis.   9. COVID (+) 05/01/20 -She received treatment infusion on 05/06/20. Symptoms overall resolved after treatment.      PLAN: -proceed with bevacizumab today,  -lab, flush, f/u, and beva in 3 and 6 weeks -restaging CT to be done in 2-3 weeks before next cycle   No problem-specific Assessment & Plan notes found for this encounter.   SUMMARY OF ONCOLOGIC HISTORY: Oncology History Overview Note  Cancer Staging Adenocarcinoma of colon metastatic to liver ([facility] Staging form: Colon and Rectum, AJCC 8th Edition - Pathologic stage from 01/24/2019: Stage IVA (pT4a, pN1a, pM1a) - Signed by BAlla Feeling NP on 02/14/2019    Adenocarcinoma of  colon metastatic to liver ([place])  01/23/2019 Imaging   ABD Xray IMPRESSION: 1. Bowel-gas pattern consistent with small bowel obstruction. No free air. 2. No acute chest findings.   01/23/2019 Imaging   CT AP IMPRESSION: Obstructing mid transverse colonic mass with  mild regional adenopathy and hepatic metastatic disease. The mass likely extends through the serosa; no ascites or peritoneal nodularity.   01/24/2019 Surgery   Surgeon: Clovis Riley MD Assistant: [name] Procedure performed: Transverse colectomy with end colostomy, liver biopsy Procedure classification: URGENT/EMERGENT Preop diagnosis: Obstructing, metastatic transverse colon mass Post-op diagnosis/intraop findings: Same   01/24/2019 Pathology Results   FINAL MICROSCOPIC DIAGNOSIS:   A. COLON, TRANSVERSE, RESECTION:  Colonic adenocarcinoma, 5 cm.  Carcinoma extends into pericolonic connective tissue and focally to  serosal surface.  Margins not involved.  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   B. LIVER NODULE, LEFT, BIOPSY:  Metastatic adenocarcinoma.    01/24/2019 Cancer Staging   Staging form: Colon and Rectum, AJCC 8th Edition - Pathologic stage from 01/24/2019: Stage IVA (pT4a, pN1a, pM1a) - Signed by [name], NP on 02/14/2019    02/02/2019 Initial Diagnosis   Adenocarcinoma of colon metastatic to liver ([place])   02/26/2019 PET scan   IMPRESSION: 1. Hypermetabolic metastatic disease in the liver and mediastinal/hilar/axillary lymph nodes. 2. Focal hypermetabolism in the rectum. Continued attention on follow-up exams is warranted. 3. Focal hypermetabolism medial to the right adrenal gland may be within a metastatic lymph node, better visualized on 01/23/2019. 4. Aortic atherosclerosis (ICD10-170.0). Coronary artery calcification.   03/12/2019 -  Chemotherapy   She started 5FU q2weeks on 03/12/19 for 2 cycles. She started full dose FOLFOX with Avastin on 04/09/19. Oxaliplatin dose reduced repeatedly due to neuropathy C12 and held since C16 on 10/09/19. Now on maintenance Avastin and 5FU q2weeks since 10/09/19       -Maintenance change to maintenance xeloda 2000 mg BID days 1-14 q21 days and q3 weeks Zirabev (15 mg/kg) starting  01/21/20. First cycle was taken 1030m BID due to misunderstanding.    05/31/2019 Imaging   Restaging CT CAP IMPRESSION: 1. Similar to mild interval decrease in size of multiple hepatic lesions, partially calcified. 2. 2 mm right upper lobe pulmonary nodule. Recommend attention on follow-up. 3. Emphysema and aortic atherosclerosis.   08/23/2019 Imaging   CT CAP w contrast  IMPRESSION: 1. The dominant peripheral right liver metastasis has mildly increased. Other smaller liver metastases are stable. 2. Otherwise no new or progressive metastatic disease in the chest, abdomen or pelvis. 3. Aortic Atherosclerosis (ICD10-I70.0) and Emphysema (ICD10-J43.9).   11/27/2019 Imaging   CT CAP w contrast  IMPRESSION: Status post transverse colectomy with right mid abdominal colostomy.   Mildly progressive hepatic metastases, as above.   No evidence of metastatic disease in the chest. Small mediastinal lymph nodes are within normal limits.   Additional stable ancillary findings as above.   02/21/2020 Imaging   IMPRESSION: 1. Stable hepatic metastatic disease. 2. Aortic atherosclerosis (ICD10-I70.0). Coronary artery calcification. 3.  Emphysema (ICD10-J43.9).   05/19/2020 Imaging   CT CAP  IMPRESSION: 1. Multiple partially calcified liver metastases are again noted. With the exception of a small lesion in segment 7/8 lesions are not significantly changed in the interval. No new liver lesions identified. 2. Coronary artery atherosclerotic calcifications. 3. Aortic atherosclerosis. 4. 2 mm right upper lobe lung nodule identified.  Unchanged.   Aortic Atherosclerosis (ICD10-I70.0).   11/17/2020 Imaging   CT CAP  IMPRESSION: 1.  Partially calcified lesions throughout the liver are stable accounting for differences in technique, contrasted imaging on today's study is compared to noncontrast imaging on the prior. 2. No new hepatic lesions. 3. Tiny 3 mm pulmonary nodule in the RIGHT upper  lobe unchanged since the prior study. Attention on follow-up. 4. Mild fullness of RIGHT paratracheal nodal tissue is minimally increased and borderline enlarged, attention on follow-up. 5. RIGHT lower quadrant colostomy. 6. 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 7. Aortic atherosclerosis.      INTERVAL HISTORY:  Casey Black is here for a follow up of metastatic colon cancer. She was last seen by me on 05/11/21. She presents to the clinic alone. She reports she continues to tolerate treatment very well. She notes she wants to get back into walking. She showed me her fitbit, which she notes her daughter bought her for Christmas.   All other systems were reviewed with the patient and are negative.  MEDICAL HISTORY:  Past Medical History:  Diagnosis Date   Anemia    low iron   Cancer (HCC)    colon cancer   Colon cancer ([place]) 01/2019   Hypertension 01/23/2019   Personal history of chemotherapy 01/2019   colon CA   SBO (small bowel obstruction) ([place]) 01/23/2019    SURGICAL HISTORY: Past Surgical History:  Procedure Laterality Date   CESAREAN SECTION     x2   COLOSTOMY N/A 01/24/2019   Procedure: End Loop Colostomy;  Surgeon: Clovis Riley, MD;  Location: [facility];  Service: General;  Laterality: N/A;   PARTIAL COLECTOMY N/A 01/24/2019   Procedure: PARTIAL COLECTOMY;  Surgeon: Clovis Riley, MD;  Location: [facility];  Service: General;  Laterality: N/A;   PORTACATH PLACEMENT Right 02/28/2019   Procedure: INSERTION PORT-A-CATH WITH ULTRASOUND GUIDANCE;  Surgeon: Clovis Riley, MD;  Location: [facility];  Service: General;  Laterality: Right;    I have reviewed the social history and family history with the patient and they are unchanged from previous note.  ALLERGIES:  has No Known Allergies.  MEDICATIONS:  Current Outpatient Medications  Medication  Sig Dispense Refill   lidocaine-prilocaine (EMLA) cream Apply 1 application topically as needed. 30 g 1   acetaminophen (TYLENOL) 325 MG tablet Take 2 tablets (650 mg total) by mouth every 6 (six) hours as needed.     amLODipine (NORVASC) 10 MG tablet TAKE 1 TABLET BY MOUTH EVERY DAY 90 tablet 1   benzonatate (TESSALON) 100 MG capsule Take 1 capsule (100 mg total) by mouth every 8 (eight) hours. 21 capsule 0   capecitabine (XELODA) 500 MG tablet Take 4 tablets (2,000 mg total) by mouth 2 (two) times daily after a meal. For 14 days, then off for 7 days. 112 tablet 2   ferrous sulfate 325 (65 FE) MG tablet TAKE 1 TABLET BY MOUTH 2 TIMES DAILY WITH A MEAL. 60 tablet 5   gabapentin (NEURONTIN) 300 MG capsule TAKE 1 CAPSULE BY MOUTH TWICE A DAY 60 capsule 3   hydrALAZINE (APRESOLINE) 25 MG tablet TAKE 1 TABLET BY MOUTH EVERY 8 HOURS 30 tablet 0   hydrochlorothiazide (HYDRODIURIL) 12.5 MG tablet Take 1 tablet (12.5 mg total) by mouth daily. 90 tablet 1   hydrocortisone (ANUSOL-HC) 2.5 % rectal cream Place 1 application rectally 2 (two) times daily. 30 g 0   Ibuprofen 200 MG CAPS Take 400 mg by  mouth daily as needed (pain).     isosorbide mononitrate (IMDUR) 30 MG 24 hr tablet TAKE 1 TABLET BY MOUTH EVERY DAY 90 tablet 1   lidocaine (LMX) 4 % cream Apply 1 application topically 3 (three) times daily as needed. 30 g 0   lisinopril (ZESTRIL) 40 MG tablet Take 1 tablet (40 mg total) by mouth daily. 90 tablet 1   loperamide (IMODIUM) 2 MG capsule TAKE 1 CAPSULE (2 MG TOTAL) BY MOUTH AS NEEDED FOR DIARRHEA OR LOOSE STOOLS. 90 capsule 0   metoprolol tartrate (LOPRESSOR) 50 MG tablet TAKE 1 TABLET BY MOUTH TWICE A DAY 60 tablet 0   ondansetron (ZOFRAN ODT) 4 MG disintegrating tablet 52m ODT q4 hours prn nausea/vomit 20 tablet 0   ondansetron (ZOFRAN) 4 MG tablet Take 1 tablet (4 mg total) by mouth every 6 (six) hours as needed for nausea. 30 tablet 0   polycarbophil (FIBERCON) 625 MG tablet Take 1 tablet (625  mg total) by mouth daily. 30 tablet 0   Vitamin D, Ergocalciferol, (DRISDOL) 1.25 MG (50000 UNIT) CAPS capsule Take 1 capsule (50,000 Units total) by mouth every 7 (seven) days for 12 doses. 12 capsule 0   No current facility-administered medications for this visit.   Facility-Administered Medications Ordered in Other Visits  Medication Dose Route Frequency Provider Last Rate Last Admin   sodium chloride flush (NS) 0.9 % injection 10 mL  10 mL Intracatheter PRN FTruitt Merle MD   10 mL at 06/06/20 1051    PHYSICAL EXAMINATION: ECOG PERFORMANCE STATUS: 1 - Symptomatic but completely ambulatory  Vitals:   06/01/21 1432  BP: (!) 137/53  Pulse: (!) 51  Resp: 18  Temp: 98.5 F (36.9 C)  SpO2: 99%   Wt Readings from Last 3 Encounters:  06/01/21 243 lb 4.8 oz (110.4 kg)  05/11/21 239 lb 3.2 oz (108.5 kg)  05/07/21 240 lb 3.2 oz (109 kg)     GENERAL:alert, no distress and comfortable SKIN: skin color normal, no rashes or significant lesions EYES: normal, Conjunctiva are pink and non-injected, sclera clear  NEURO: alert & oriented x 3 with fluent speech  LABORATORY DATA:  I have reviewed the data as listed CBC Latest Ref Rng & Units 05/29/2021 05/11/2021 04/20/2021  WBC 4.0 - 10.5 K/uL 6.7 7.0 7.0  Hemoglobin 12.0 - 15.0 g/dL 11.7(L) 11.2(L) 11.6(L)  Hematocrit 36.0 - 46.0 % 35.5(L) 35.0(L) 35.6(L)  Platelets 150 - 400 K/uL 179 173 178     CMP Latest Ref Rng & Units 05/29/2021 05/11/2021 04/20/2021  Glucose 70 - 99 mg/dL 84 91 91  BUN 8 - 23 mg/dL 18 25(H) 22  Creatinine 0.44 - 1.00 mg/dL 0.89 1.07(H) 1.00  Sodium 135 - 145 mmol/L 137 137 135  Potassium 3.5 - 5.1 mmol/L 4.4 4.5 4.8  Chloride 98 - 111 mmol/L 108 108 108  CO2 22 - 32 mmol/L 25 24 23   Calcium 8.9 - 10.3 mg/dL 9.9 9.3 9.8  Total Protein 6.5 - 8.1 g/dL 7.8 7.5 8.1  Total Bilirubin 0.3 - 1.2 mg/dL 0.6 0.5 0.5  Alkaline Phos 38 - 126 U/L 87 101 99  AST 15 - 41 U/L 16 15 17   ALT 0 - 44 U/L 9 8 10       RADIOGRAPHIC  STUDIES: I have personally reviewed the radiological images as listed and agreed with the findings in the report. No results found.    No orders of the defined types were placed in this encounter.  All questions  were answered. The patient knows to call the clinic with any problems, questions or concerns. No barriers to learning was detected.      Truitt Merle, MD 06/01/2021   I, Wilburn Mylar, am acting as scribe for Truitt Merle, MD.   I have reviewed the above documentation for accuracy and completeness, and I agree with the above.

## 2021-06-01 NOTE — Patient Instructions (Signed)
[facility] MEDICAL ONCOLOGY  Discharge Instructions: °Thank you for choosing [facility] to provide your oncology and hematology care.  ° °If you have a lab appointment with the Cancer Center, please go directly to the Cancer Center and check in at the registration area. °  °Wear comfortable clothing and clothing appropriate for easy access to any Portacath or PICC line.  ° °We strive to give you quality time with your provider. You may need to reschedule your appointment if you arrive late (15 or more minutes).  Arriving late affects you and other patients whose appointments are after yours.  Also, if you miss three or more appointments without notifying the office, you may be dismissed from the clinic at the provider’s discretion.    °  °For prescription refill requests, have your pharmacy contact our office and allow 72 hours for refills to be completed.   ° °Today you received the following chemotherapy and/or immunotherapy agents: Bevacizumab.     °  °To help prevent nausea and vomiting after your treatment, we encourage you to take your nausea medication as directed. ° °BELOW ARE SYMPTOMS THAT SHOULD BE REPORTED IMMEDIATELY: °*FEVER GREATER THAN 100.4 F (38 °C) OR HIGHER °*CHILLS OR SWEATING °*NAUSEA AND VOMITING THAT IS NOT CONTROLLED WITH YOUR NAUSEA MEDICATION °*UNUSUAL SHORTNESS OF BREATH °*UNUSUAL BRUISING OR BLEEDING °*URINARY PROBLEMS (pain or burning when urinating, or frequent urination) °*BOWEL PROBLEMS (unusual diarrhea, constipation, pain near the anus) °TENDERNESS IN MOUTH AND THROAT WITH OR WITHOUT PRESENCE OF ULCERS (sore throat, sores in mouth, or a toothache) °UNUSUAL RASH, SWELLING OR PAIN  °UNUSUAL VAGINAL DISCHARGE OR ITCHING  ° °Items with * indicate a potential emergency and should be followed up as soon as possible or go to the Emergency Department if any problems should occur. ° °Please show the CHEMOTHERAPY ALERT CARD or IMMUNOTHERAPY ALERT CARD at check-in  to the Emergency Department and triage nurse. ° °Should you have questions after your visit or need to cancel or reschedule your appointment, please contact [facility]  Dept: [phone]  and follow the prompts.  Office hours are 8:00 a.m. to 4:30 p.m. Monday - Friday. Please note that voicemails left after 4:00 p.m. may not be returned until the following business day.  We are closed weekends and major holidays. You have access to a nurse at all times for urgent questions. Please call the main number to the clinic Dept: [phone] and follow the prompts. ° ° °For any non-urgent questions, you may also contact your provider using MyChart. We now offer e-Visits for anyone 18 and older to request care online for non-urgent symptoms. For details visit [email or web address]. °  °Also download the MyChart app! Go to the app store, search "MyChart", open the app, select , and log in with your MyChart username and password. ° °Due to Covid, a mask is required upon entering the hospital/clinic. If you do not have a mask, one will be given to you upon arrival. For doctor visits, patients may have 1 support person aged 18 or older with them. For treatment visits, patients cannot have anyone with them due to current Covid guidelines and our immunocompromised population.  ° °

## 2021-06-09 ENCOUNTER — Encounter (HOSPITAL_COMMUNITY): Payer: Self-pay

## 2021-06-09 ENCOUNTER — Ambulatory Visit (HOSPITAL_COMMUNITY)
Admission: RE | Admit: 2021-06-09 | Discharge: 2021-06-09 | Disposition: A | Discharge status: Home / Self Care | Payer: Medicaid Other | Source: Ambulatory Visit | Attending: Hematology | Admitting: Hematology

## 2021-06-09 ENCOUNTER — Other Ambulatory Visit: Payer: Self-pay

## 2021-06-09 DIAGNOSIS — C189 Malignant neoplasm of colon, unspecified: Secondary | ICD-10-CM | POA: Insufficient documentation

## 2021-06-09 DIAGNOSIS — C787 Secondary malignant neoplasm of liver and intrahepatic bile duct: Secondary | ICD-10-CM | POA: Insufficient documentation

## 2021-06-09 DIAGNOSIS — I2510 Atherosclerotic heart disease of native coronary artery without angina pectoris: Secondary | ICD-10-CM | POA: Diagnosis not present

## 2021-06-09 DIAGNOSIS — I700 Atherosclerosis of aorta: Secondary | ICD-10-CM | POA: Diagnosis not present

## 2021-06-09 MED ORDER — IOHEXOL 300 MG/ML  SOLN
100.0000 mL | Freq: Once | INTRAMUSCULAR | Status: AC | PRN
Start: 2021-06-09 — End: 2021-06-09
  Administered 2021-06-09: 100 mL via INTRAVENOUS

## 2021-06-09 MED ORDER — HEPARIN SOD (PORK) LOCK FLUSH 100 UNIT/ML IV SOLN
500.0000 [IU] | Freq: Once | INTRAVENOUS | Status: AC
  Administered 2021-06-09: 500 [IU] via INTRAVENOUS

## 2021-06-09 MED ORDER — SODIUM CHLORIDE (PF) 0.9 % IJ SOLN
INTRAMUSCULAR | Status: AC
  Filled 2021-06-09: qty 50

## 2021-06-09 MED ORDER — HEPARIN SOD (PORK) LOCK FLUSH 100 UNIT/ML IV SOLN
INTRAVENOUS | Status: AC
  Filled 2021-06-09: qty 5

## 2021-06-18 ENCOUNTER — Other Ambulatory Visit (HOSPITAL_COMMUNITY): Payer: Self-pay

## 2021-06-22 ENCOUNTER — Inpatient Hospital Stay: Payer: Medicaid Other

## 2021-06-22 ENCOUNTER — Other Ambulatory Visit: Payer: Self-pay

## 2021-06-22 ENCOUNTER — Other Ambulatory Visit (HOSPITAL_COMMUNITY): Payer: Self-pay

## 2021-06-22 ENCOUNTER — Encounter: Payer: Self-pay | Admitting: Hematology

## 2021-06-22 ENCOUNTER — Inpatient Hospital Stay: Payer: Medicaid Other | Admitting: Hematology

## 2021-06-22 ENCOUNTER — Inpatient Hospital Stay: Payer: Medicaid Other | Attending: Hematology

## 2021-06-22 VITALS — BP 143/60 | HR 41 | Temp 97.6°F | Resp 18 | Ht 67.0 in | Wt 246.0 lb

## 2021-06-22 DIAGNOSIS — C189 Malignant neoplasm of colon, unspecified: Secondary | ICD-10-CM | POA: Diagnosis not present

## 2021-06-22 DIAGNOSIS — C184 Malignant neoplasm of transverse colon: Secondary | ICD-10-CM | POA: Insufficient documentation

## 2021-06-22 DIAGNOSIS — C787 Secondary malignant neoplasm of liver and intrahepatic bile duct: Secondary | ICD-10-CM | POA: Insufficient documentation

## 2021-06-22 DIAGNOSIS — Z5112 Encounter for antineoplastic immunotherapy: Secondary | ICD-10-CM | POA: Insufficient documentation

## 2021-06-22 LAB — COMPREHENSIVE METABOLIC PANEL
ALT: 11 U/L (ref 0–44)
AST: 21 U/L (ref 15–41)
Albumin: 3.9 g/dL (ref 3.5–5.0)
Alkaline Phosphatase: 83 U/L (ref 38–126)
Anion gap: 5 (ref 5–15)
BUN: 20 mg/dL (ref 8–23)
CO2: 24 mmol/L (ref 22–32)
Calcium: 9.9 mg/dL (ref 8.9–10.3)
Chloride: 106 mmol/L (ref 98–111)
Creatinine, Ser: 0.99 mg/dL (ref 0.44–1.00)
GFR, Estimated: >60 mL/min (ref >60)
Glucose, Bld: 84 mg/dL (ref 70–99)
Potassium: 4.6 mmol/L (ref 3.5–5.1)
Sodium: 135 mmol/L (ref 135–145)
Total Bilirubin: 0.7 mg/dL (ref 0.3–1.2)
Total Protein: 8.1 g/dL (ref 6.5–8.1)

## 2021-06-22 LAB — CBC WITH DIFFERENTIAL/PLATELET
Abs Immature Granulocytes: 0.03 10*3/uL (ref 0.00–0.07)
Basophils Absolute: 0 10*3/uL (ref 0.0–0.1)
Basophils Relative: 0 %
Eosinophils Absolute: 0.2 10*3/uL (ref 0.0–0.5)
Eosinophils Relative: 3 %
HCT: 36.7 % (ref 36.0–46.0)
Hemoglobin: 12.3 g/dL (ref 12.0–15.0)
Immature Granulocytes: 0 %
Lymphocytes Relative: 25 %
Lymphs Abs: 1.9 10*3/uL (ref 0.7–4.0)
MCH: 34.6 pg — ABNORMAL HIGH (ref 26.0–34.0)
MCHC: 33.5 g/dL (ref 30.0–36.0)
MCV: 103.4 fL — ABNORMAL HIGH (ref 80.0–100.0)
Monocytes Absolute: 0.6 10*3/uL (ref 0.1–1.0)
Monocytes Relative: 9 %
Neutro Abs: 4.5 10*3/uL (ref 1.7–7.7)
Neutrophils Relative %: 63 %
Platelets: 171 10*3/uL (ref 150–400)
RBC: 3.55 MIL/uL — ABNORMAL LOW (ref 3.87–5.11)
RDW: 16.7 % — ABNORMAL HIGH (ref 11.5–15.5)
WBC: 7.3 10*3/uL (ref 4.0–10.5)
nRBC: 0 % (ref 0.0–0.2)

## 2021-06-22 LAB — CEA (IN HOUSE-CHCC): CEA (CHCC-In House): 242.45 ng/mL — ABNORMAL HIGH (ref 0.00–5.00)

## 2021-06-22 LAB — TOTAL PROTEIN, URINE DIPSTICK: Protein, ur: NEGATIVE mg/dL

## 2021-06-22 MED ORDER — SODIUM CHLORIDE 0.9 % IV SOLN
7.5000 mg/kg | Freq: Once | INTRAVENOUS | Status: AC
  Administered 2021-06-22: 800 mg via INTRAVENOUS
  Filled 2021-06-22: qty 32

## 2021-06-22 MED ORDER — HEPARIN SOD (PORK) LOCK FLUSH 100 UNIT/ML IV SOLN
500.0000 [IU] | Freq: Once | INTRAVENOUS | Status: AC | PRN
  Administered 2021-06-22: 500 [IU]

## 2021-06-22 MED ORDER — SODIUM CHLORIDE 0.9% FLUSH
10.0000 mL | INTRAVENOUS | Status: DC | PRN
  Administered 2021-06-22: 10 mL

## 2021-06-22 MED ORDER — SODIUM CHLORIDE 0.9 % IV SOLN: Freq: Once | INTRAVENOUS | Status: AC

## 2021-06-22 NOTE — Progress Notes (Signed)
?[place]   ?[phone] [phone]   ?Clinic Follow up Note  ? ?Patient Care Team: ?[name], [name], MD as PCP - General (Internal Medicine) ?Clovis Riley, MD as Consulting Physician (General Surgery) ?Truitt Merle, MD as Consulting Physician (Hematology) ?[name], NP as Nurse Practitioner (Nurse Practitioner) ? ?Date of Service:  06/22/2021 ? ?CHIEF COMPLAINT: f/u of metastatic colon cancer ? ?CURRENT THERAPY:  ?Maintenance Avastin and Xeloda 2058m q12h on day 1-14 every 3 weeks  ? ?ASSESSMENT & PLAN:  ?Casey Thelanderis a 61y.o. female with  ? ?1. 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 ?-PET from 02/26/19 shows known liver metastasis and metastatic lymphadenopathy in chest and right axilla. ?-[name] shows MSI stable disease, Kras+ No targetable mutation, she is not a candidate for EGFR or immunotherapy. ?-Began palliative first line chemo on 03/12/2019, received 5FU/leuc with first 2 cycles for large open abdominal wound after surgery. She started full dose FOLFOX and avastin with cycle 2. Due to neuropathy Oxaliplatin was stopped after 09/24/19, she has been on maintenance therapy. ?-For convenience she was switched to oral chemo Xeloda 2 weeks on/1 week off and Bev every 3 weeks in 01/21/2020.  Treatment held 05/01/20 - 05/12/20 due to COVID infection ?-restaging CT 06/09/21 showed stable liver mets, no new lesions or evidence of progression. I reviewed the results with her today.  ?-She is clinically doing well. Labs reviewed, CBC is improved, other labs pending. Continue Xeloda and bevacizumab maintenance therapy. She is tolerating very well with only mild skin toxicity. ?  ?2. Chemotherapy induced peripheral neuropathy  (CIPN), G1 ?-secondary to Oxaliplatin, developed after cycle 12 FOLFOX. Oxaliplatin d/c after C15 on 09/24/19 ?-Neuropathy in fingers and feet are moderate and stable with mild decreased function in fingers.  ?-She is currently on gabapentin 100 mg AM and 300 mg qHS and titrate up if needed.  ?-overall improved, no impact on her function now. Will continue to monitor.  ?  ?3. 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 Will give IV iron if needed  ?-Hgb improved to 12.3 (WNL) today (06/22/21) ?  ?4. HTN, uncontrolled  ?-on hydralazine, isosorbide, lisinopril, metoprolol, amlodipine and HCTZ  ?-Will monitor on bevacizumab.  ?  ?5. Goals of care discussion, Social support ?-The patient understands the goal of care is palliative. She is full code. ?-She worked in [employer] 2 different jobs but has been on disability since her diagnosis. ?  ?9. COVID (+) 05/01/20 ?-She received treatment infusion on 05/06/20. Symptoms overall resolved after treatment.  ?  ?  ?PLAN: ?-proceed with bevacizumab today, continue Xeloda, she started this morning  ?-lab, flush, f/u, and beva in 3 and 6 weeks ? ? ?No problem-specific Assessment & Plan notes found for this encounter. ? ? ?SUMMARY OF ONCOLOGIC HISTORY: ?Oncology History Overview Note  ?Cancer Staging ?Adenocarcinoma of colon metastatic to liver ([facility] ?Staging form: Colon and Rectum, AJCC 8th Edition ?- Pathologic stage from 01/24/2019: Stage IVA (pT4a, pN1a, pM1a) - Signed by BAlla Feeling NP on 02/14/2019 ? ?  ?Adenocarcinoma of colon metastatic to liver ([facility]  ?01/23/2019 Imaging  ? ABD Xray IMPRESSION: ?1. Bowel-gas pattern consistent with small bowel obstruction.  No ?free air. ?2. No acute chest findings. ?  ?01/23/2019 Imaging  ? CT AP IMPRESSION: ?Obstructing mid transverse colonic mass with mild regional ?adenopathy and hepatic metastatic disease. The mass likely extends ?through  the serosa; no ascites or peritoneal nodularity. ?  ?01/24/2019 Surgery  ? Surgeon: Clovis Riley MD ?Assistant: [name] ?Procedure performed: Transverse colectomy with end colostomy, liver biopsy ?Procedure classification: URGENT/EMERGENT ?Preop diagnosis: Obstructing, metastatic transverse colon mass ?Post-op diagnosis/intraop findings: Same ?  ?01/24/2019 Pathology Results  ? FINAL MICROSCOPIC DIAGNOSIS:  ? ?A. COLON, TRANSVERSE, RESECTION:  ?Colonic adenocarcinoma, 5 cm.  ?Carcinoma extends into pericolonic connective tissue and focally to  ?serosal surface.  ?Margins not involved.  ?Metastatic carcinoma in one of thirteen lymph nodes (1/13).  ? ?B. LIVER NODULE, LEFT, BIOPSY:  ?Metastatic adenocarcinoma.  ?  ?01/24/2019 Cancer Staging  ? Staging form: Colon and Rectum, AJCC 8th Edition ?- Pathologic stage from 01/24/2019: Stage IVA (pT4a, pN1a, pM1a) - Signed by [name], NP on 02/14/2019 ? ?  ?02/02/2019 Initial Diagnosis  ? Adenocarcinoma of colon metastatic to liver [facility]) ?  ?02/26/2019 PET scan  ? IMPRESSION: ?1. Hypermetabolic metastatic disease in the liver and ?mediastinal/hilar/axillary lymph nodes. ?2. Focal hypermetabolism in the rectum. Continued attention on ?follow-up exams is warranted. ?3. Focal hypermetabolism medial to the right adrenal gland may be ?within a metastatic lymph node, better visualized on 01/23/2019. ?4. Aortic atherosclerosis (ICD10-170.0). Coronary artery ?calcification. ?  ?03/12/2019 -  Chemotherapy  ? She started 5FU q2weeks on 03/12/19 for 2 cycles. She started full dose FOLFOX with Avastin on 04/09/19. Oxaliplatin dose reduced repeatedly due to neuropathy C12 and held since C16 on 10/09/19. Now on maintenance Avastin and 5FU q2weeks since 10/09/19 ?      -Maintenance change to maintenance xeloda 2000 mg BID days 1-14 q21 days and q3 weeks Zirabev (15 mg/kg) starting 01/21/20. First cycle was taken 1060m BID due to misunderstanding.  ?  ?05/31/2019 Imaging  ?  Restaging CT CAP IMPRESSION: ?1. Similar to mild interval decrease in size of multiple hepatic lesions, partially calcified. ?2. 2 mm right upper lobe pulmonary nodule. Recommend attention on follow-up. ?3. Emphysema and aortic atherosclerosis. ?  ?08/23/2019 Imaging  ? CT CAP w contrast  ?IMPRESSION: ?1. The dominant peripheral right liver metastasis has mildly ?increased. Other smaller liver metastases are stable. ?2. Otherwise no new or progressive metastatic disease in the chest, ?abdomen or pelvis. ?3. Aortic Atherosclerosis (ICD10-I70.0) and Emphysema (ICD10-J43.9). ?  ?11/27/2019 Imaging  ? CT CAP w contrast  ?IMPRESSION: ?Status post transverse colectomy with right mid abdominal colostomy. ?  ?Mildly progressive hepatic metastases, as above. ?  ?No evidence of metastatic disease in the chest. Small mediastinal ?lymph nodes are within normal limits. ?  ?Additional stable ancillary findings as above. ?  ?02/21/2020 Imaging  ? IMPRESSION: ?1. Stable hepatic metastatic disease. ?2. Aortic atherosclerosis (ICD10-I70.0). Coronary artery ?calcification. ?3.  Emphysema (ICD10-J43.9). ?  ?05/19/2020 Imaging  ? CT CAP  ?IMPRESSION: ?1. Multiple partially calcified liver metastases are again noted. ?With the exception of a small lesion in segment 7/8 lesions are not ?significantly changed in the interval. No new liver lesions ?identified. ?2. Coronary artery atherosclerotic calcifications. ?3. Aortic atherosclerosis. ?4. 2 mm right upper lobe lung nodule identified.  Unchanged. ?  ?Aortic Atherosclerosis (ICD10-I70.0). ?  ?11/17/2020 Imaging  ? CT CAP ? ?IMPRESSION: ?1. Partially calcified lesions throughout the liver are stable ?accounting for differences in technique, contrasted imaging on ?today's study is compared to  noncontrast imaging on the prior. ?2. No new hepatic lesions. ?3. Tiny 3 mm pulmonary nodule in the RIGHT upper lobe unchanged since the prior study. Attention on follow-up. ?4. Mild fullness of RIGHT  paratracheal nodal tissue is minimally ?increased and borderline enlarged, attention on follow-up. ?5. RIGHT lower quadrant colostomy. 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

## 2021-06-23 ENCOUNTER — Telehealth: Payer: Self-pay | Admitting: Hematology

## 2021-06-23 NOTE — Telephone Encounter (Signed)
Left message with follow-up appointment per 3/20 los. ?

## 2021-06-29 DIAGNOSIS — Z933 Colostomy status: Secondary | ICD-10-CM | POA: Diagnosis not present

## 2021-06-29 DIAGNOSIS — S31109A Unspecified open wound of abdominal wall, unspecified quadrant without penetration into peritoneal cavity, initial encounter: Secondary | ICD-10-CM | POA: Diagnosis not present

## 2021-06-29 DIAGNOSIS — T8131XD Disruption of external operation (surgical) wound, not elsewhere classified, subsequent encounter: Secondary | ICD-10-CM | POA: Diagnosis not present

## 2021-06-29 DIAGNOSIS — K56609 Unspecified intestinal obstruction, unspecified as to partial versus complete obstruction: Secondary | ICD-10-CM | POA: Diagnosis not present

## 2021-07-03 ENCOUNTER — Other Ambulatory Visit (HOSPITAL_COMMUNITY): Payer: Self-pay

## 2021-07-08 ENCOUNTER — Telehealth: Payer: Self-pay | Admitting: Internal Medicine

## 2021-07-08 NOTE — Telephone Encounter (Signed)
.. ?  Medicaid Managed Care  ? ?Unsuccessful Outreach Note ? ?07/08/2021 ?Name: Casey Black MRN: [id number] DOB: 1960-04-24 ? ?Referred by: [name], [name], MD ?Reason for referral : High Risk Managed Medicaid (I called the patient today to get her scheduled with the MM team. I left my name and number on her VM.) ? ? ?A second unsuccessful telephone outreach was attempted today. The patient was referred to the case management team for assistance with care management and care coordination.  ? ?Follow Up Plan: The care management team will reach out to the patient again over the next 7-+14 days.  ? ? ?Reita Chard ?Care Guide, High Risk Medicaid Managed Care ?Embedded Care Coordination ?[place]  ? ?SIGNATURE  ?

## 2021-07-13 ENCOUNTER — Inpatient Hospital Stay: Payer: Medicare Other

## 2021-07-13 ENCOUNTER — Inpatient Hospital Stay: Payer: Medicare Other | Admitting: Hematology

## 2021-07-13 ENCOUNTER — Encounter: Payer: Self-pay | Admitting: Hematology

## 2021-07-13 ENCOUNTER — Other Ambulatory Visit (HOSPITAL_COMMUNITY): Payer: Self-pay

## 2021-07-13 ENCOUNTER — Inpatient Hospital Stay: Payer: Medicare Other | Attending: Hematology

## 2021-07-13 ENCOUNTER — Other Ambulatory Visit: Payer: Self-pay

## 2021-07-13 VITALS — BP 141/59 | HR 52 | Temp 98.6°F | Resp 18 | Ht 67.0 in | Wt 248.3 lb

## 2021-07-13 VITALS — BP 140/59 | HR 52 | Temp 97.8°F | Resp 18

## 2021-07-13 DIAGNOSIS — C189 Malignant neoplasm of colon, unspecified: Secondary | ICD-10-CM

## 2021-07-13 DIAGNOSIS — Z95828 Presence of other vascular implants and grafts: Secondary | ICD-10-CM

## 2021-07-13 DIAGNOSIS — C787 Secondary malignant neoplasm of liver and intrahepatic bile duct: Secondary | ICD-10-CM

## 2021-07-13 DIAGNOSIS — Z5112 Encounter for antineoplastic immunotherapy: Secondary | ICD-10-CM | POA: Insufficient documentation

## 2021-07-13 DIAGNOSIS — C184 Malignant neoplasm of transverse colon: Secondary | ICD-10-CM | POA: Insufficient documentation

## 2021-07-13 LAB — CBC WITH DIFFERENTIAL/PLATELET
Abs Immature Granulocytes: 0.02 10*3/uL (ref 0.00–0.07)
Basophils Absolute: 0 10*3/uL (ref 0.0–0.1)
Basophils Relative: 1 %
Eosinophils Absolute: 0.2 10*3/uL (ref 0.0–0.5)
Eosinophils Relative: 3 %
HCT: 34.3 % — ABNORMAL LOW (ref 36.0–46.0)
Hemoglobin: 11.1 g/dL — ABNORMAL LOW (ref 12.0–15.0)
Immature Granulocytes: 0 %
Lymphocytes Relative: 27 %
Lymphs Abs: 1.9 10*3/uL (ref 0.7–4.0)
MCH: 33.5 pg (ref 26.0–34.0)
MCHC: 32.4 g/dL (ref 30.0–36.0)
MCV: 103.6 fL — ABNORMAL HIGH (ref 80.0–100.0)
Monocytes Absolute: 0.6 10*3/uL (ref 0.1–1.0)
Monocytes Relative: 9 %
Neutro Abs: 4.2 10*3/uL (ref 1.7–7.7)
Neutrophils Relative %: 60 %
Platelets: 148 10*3/uL — ABNORMAL LOW (ref 150–400)
RBC: 3.31 MIL/uL — ABNORMAL LOW (ref 3.87–5.11)
RDW: 17.3 % — ABNORMAL HIGH (ref 11.5–15.5)
WBC: 7 10*3/uL (ref 4.0–10.5)
nRBC: 0 % (ref 0.0–0.2)

## 2021-07-13 LAB — COMPREHENSIVE METABOLIC PANEL
ALT: 9 U/L (ref 0–44)
AST: 17 U/L (ref 15–41)
Albumin: 3.7 g/dL (ref 3.5–5.0)
Alkaline Phosphatase: 88 U/L (ref 38–126)
Anion gap: 4 — ABNORMAL LOW (ref 5–15)
BUN: 26 mg/dL — ABNORMAL HIGH (ref 8–23)
CO2: 22 mmol/L (ref 22–32)
Calcium: 9.3 mg/dL (ref 8.9–10.3)
Chloride: 107 mmol/L (ref 98–111)
Creatinine, Ser: 1.01 mg/dL — ABNORMAL HIGH (ref 0.44–1.00)
GFR, Estimated: >60 mL/min (ref >60)
Glucose, Bld: 89 mg/dL (ref 70–99)
Potassium: 4.2 mmol/L (ref 3.5–5.1)
Sodium: 133 mmol/L — ABNORMAL LOW (ref 135–145)
Total Bilirubin: 0.7 mg/dL (ref 0.3–1.2)
Total Protein: 7.6 g/dL (ref 6.5–8.1)

## 2021-07-13 LAB — CEA (IN HOUSE-CHCC): CEA (CHCC-In House): 246.39 ng/mL — ABNORMAL HIGH (ref 0.00–5.00)

## 2021-07-13 LAB — TOTAL PROTEIN, URINE DIPSTICK: Protein, ur: NEGATIVE mg/dL

## 2021-07-13 MED ORDER — SODIUM CHLORIDE 0.9% FLUSH
10.0000 mL | INTRAVENOUS | Status: DC | PRN
  Administered 2021-07-13: 10 mL

## 2021-07-13 MED ORDER — SODIUM CHLORIDE 0.9 % IV SOLN: Freq: Once | INTRAVENOUS | Status: AC

## 2021-07-13 MED ORDER — CAPECITABINE 500 MG PO TABS
2000.0000 mg | ORAL_TABLET | Freq: Two times a day (BID) | ORAL | 2 refills | Status: DC
  Filled 2021-07-13: qty 112, 21d supply, fill #0
  Filled 2021-07-13: qty 112, 14d supply, fill #0
  Filled 2021-07-27 (×2): qty 112, 21d supply, fill #0
  Filled 2021-08-20: qty 112, 21d supply, fill #1

## 2021-07-13 MED ORDER — HEPARIN SOD (PORK) LOCK FLUSH 100 UNIT/ML IV SOLN
500.0000 [IU] | Freq: Once | INTRAVENOUS | Status: AC | PRN
  Administered 2021-07-13: 500 [IU]

## 2021-07-13 MED ORDER — SODIUM CHLORIDE 0.9 % IV SOLN
7.5000 mg/kg | Freq: Once | INTRAVENOUS | Status: AC
  Administered 2021-07-13: 800 mg via INTRAVENOUS
  Filled 2021-07-13: qty 32

## 2021-07-13 NOTE — Progress Notes (Signed)
?Casey [place]   ?Telephone:([phone] [phone]   ?Clinic Follow up Note  ? ?Patient Care Team: ?Casey Black, Casey Halsted, MD as PCP - General (Internal Medicine) ?Casey Riley, MD as Consulting Physician (General Surgery) ?Casey Merle, MD as Consulting Physician (Hematology) ?Casey Feeling, NP as Nurse Practitioner (Nurse Practitioner) ? ?Date of Service:  07/13/2021 ? ?CHIEF COMPLAINT: f/u of metastatic colon cancer ? ?CURRENT THERAPY:  ?Maintenance Avastin and Xeloda, q3weeks, starting 01/21/20 ? -Xeloda dose: 2063m q12h on day 1-14 ? ?ASSESSMENT & PLAN:  ?MLouine Tenpennyis a 61y.o. female with  ? ?1. 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 ?-PET from 02/26/19 shows known liver metastasis and metastatic lymphadenopathy in chest and right axilla. ?-[name] shows MSI stable disease, Kras+ No targetable mutation, she is not a candidate for EGFR or immunotherapy. ?-Began palliative first line chemo on 03/12/2019, received 5FU/leuc with first 2 cycles for large open abdominal wound after surgery. She started full dose FOLFOX and avastin with cycle 2. Due to neuropathy Oxaliplatin was stopped after 09/24/19, she has been on maintenance low dose chemotherapy since then. ?-For convenience she was switched to oral chemo Xeloda 2 weeks on/1 week off and Bev every 3 weeks in 01/21/20.  Treatment held 05/01/20 - 05/12/20 due to COVID infection ?-restaging CT 06/09/21 showed stable liver mets in the mild mediastinal adenopathy, no new lesions or evidence of progression.  ?-She is clinically doing well. Labs reviewed, overall stable, urine protein negative. Continue Xeloda and bevacizumab maintenance therapy. She is tolerating very well with only mild skin  toxicity. ?-I plan to get a PET scan in June, if she has limited disease in liver and mediastinal lymph nodes, I will do her case in our GI tumor conference to see if she is a candidate for local therapy ?  ?2. Chemotherapy induced peripheral neuropathy (CIPN), G1 ?-secondary to Oxaliplatin, developed after cycle 12 FOLFOX. Oxaliplatin d/c after C15 on 09/24/19 ?-Neuropathy in fingers and feet are moderate and stable with mild decreased function in fingers.  ?-She is currently on gabapentin 100 mg AM and 300 mg qHS and titrate up if needed.  ?-overall improved, no impact on her function now. Will continue to monitor.  ?  ?3. 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 Will give IV iron if needed  ?-Hgb 11.1 today (07/13/21) ?  ?4. HTN, uncontrolled  ?-on hydralazine, isosorbide, lisinopril, metoprolol, amlodipine and HCTZ  ?-Will monitor on bevacizumab.  ?  ?5. Goals of care discussion, Social support ?-The patient understands the goal of care is palliative. She is full code. ?-She worked in [employer] 2 different jobs but has been on disability since her diagnosis. ?  ?9. COVID (+) 05/01/20 ?-She received treatment infusion on 05/06/20. Symptoms overall resolved after treatment.  ?  ?  ?PLAN: ?-proceed with bevacizumab today  ?-continue Xeloda, she started this morning  ?-lab, flush, f/u, and beva in 3 and 6 weeks ? ? ?No problem-specific Assessment & Plan notes found for this encounter. ? ? ?SUMMARY OF ONCOLOGIC HISTORY: ?Oncology History Overview Note  ?Cancer Staging ?Adenocarcinoma of colon metastatic to liver ([facility] ?Staging form: Colon and Rectum, AJCC 8th Edition ?- Pathologic stage from  01/24/2019: Stage IVA (pT4a, pN1a, pM1a) - Signed by Casey Feeling, NP on 02/14/2019 ? ?  ?Adenocarcinoma of colon metastatic to liver [facility])  ?01/23/2019 Imaging  ? ABD Xray IMPRESSION: ?1. Bowel-gas pattern consistent with small bowel  obstruction. No ?free air. ?2. No acute chest findings. ?  ?01/23/2019 Imaging  ? CT AP IMPRESSION: ?Obstructing mid transverse colonic mass with mild regional ?adenopathy and hepatic metastatic disease. The mass likely extends ?through the serosa; no ascites or peritoneal nodularity. ?  ?01/24/2019 Surgery  ? Surgeon: Casey Riley MD ?Assistant: [name] ?Procedure performed: Transverse colectomy with end colostomy, liver biopsy ?Procedure classification: URGENT/EMERGENT ?Preop diagnosis: Obstructing, metastatic transverse colon mass ?Post-op diagnosis/intraop findings: Same ?  ?01/24/2019 Pathology Results  ? FINAL MICROSCOPIC DIAGNOSIS:  ? ?A. COLON, TRANSVERSE, RESECTION:  ?Colonic adenocarcinoma, 5 cm.  ?Carcinoma extends into pericolonic connective tissue and focally to  ?serosal surface.  ?Margins not involved.  ?Metastatic carcinoma in one of thirteen lymph nodes (1/13).  ? ?B. LIVER NODULE, LEFT, BIOPSY:  ?Metastatic adenocarcinoma.  ?  ?01/24/2019 Cancer Staging  ? Staging form: Colon and Rectum, AJCC 8th Edition ?- Pathologic stage from 01/24/2019: Stage IVA (pT4a, pN1a, pM1a) - Signed by Casey Feeling, NP on 02/14/2019 ? ?  ?02/02/2019 Initial Diagnosis  ? Adenocarcinoma of colon metastatic to liver [facility]) ?  ?02/26/2019 PET scan  ? IMPRESSION: ?1. Hypermetabolic metastatic disease in the liver and ?mediastinal/hilar/axillary lymph nodes. ?2. Focal hypermetabolism in the rectum. Continued attention on ?follow-up exams is warranted. ?3. Focal hypermetabolism medial to the right adrenal gland may be ?within a metastatic lymph node, better visualized on 01/23/2019. ?4. Aortic atherosclerosis (ICD10-170.0). Coronary artery ?calcification. ?  ?03/12/2019 -  Chemotherapy  ? She started 5FU q2weeks on 03/12/19 for 2 cycles. She started full dose FOLFOX with Avastin on 04/09/19. Oxaliplatin dose reduced repeatedly due to neuropathy C12 and held since C16 on 10/09/19. Now on maintenance Avastin and 5FU  q2weeks since 10/09/19 ?      -Maintenance change to maintenance xeloda 2000 mg BID days 1-14 q21 days and q3 weeks Zirabev (15 mg/kg) starting 01/21/20. First cycle was taken 1055m BID due to misunderstanding.  ?  ?05/31/2019 Imaging  ? Restaging CT CAP IMPRESSION: ?1. Similar to mild interval decrease in size of multiple hepatic lesions, partially calcified. ?2. 2 mm right upper lobe pulmonary nodule. Recommend attention on follow-up. ?3. Emphysema and aortic atherosclerosis. ?  ?08/23/2019 Imaging  ? CT CAP w contrast  ?IMPRESSION: ?1. The dominant peripheral right liver metastasis has mildly ?increased. Other smaller liver metastases are stable. ?2. Otherwise no new or progressive metastatic disease in the chest, ?abdomen or pelvis. ?3. Aortic Atherosclerosis (ICD10-I70.0) and Emphysema (ICD10-J43.9). ?  ?11/27/2019 Imaging  ? CT CAP w contrast  ?IMPRESSION: ?Status post transverse colectomy with right mid abdominal colostomy. ?  ?Mildly progressive hepatic metastases, as above. ?  ?No evidence of metastatic disease in the chest. Small mediastinal ?lymph nodes are within normal limits. ?  ?Additional stable ancillary findings as above. ?  ?02/21/2020 Imaging  ? IMPRESSION: ?1. Stable hepatic metastatic disease. ?2. Aortic atherosclerosis (ICD10-I70.0). Coronary artery ?calcification. ?3.  Emphysema (ICD10-J43.9). ?  ?05/19/2020 Imaging  ? CT CAP  ?IMPRESSION: ?1. Multiple partially calcified liver metastases are again noted. ?With the exception of a small lesion in segment 7/8 lesions are not ?significantly changed in the interval. No new liver lesions ?identified. ?2. Coronary artery atherosclerotic calcifications. ?3. Aortic atherosclerosis. ?4. 2 mm right upper lobe lung  nodule identified.  Unchanged. ?  ?Aortic Atherosclerosis (ICD10-I70.0). ?  ?11/17/2020 Imaging  ? CT CAP ? ?IMPRESSION: ?1. Partially calcified lesions throughout the liver are stable ?accounting for differences in technique, contrasted imaging  on ?today's study is compared to noncontrast imaging on the prior. ?2. No new hepatic lesions. ?3. Tiny 3 mm pulmonary nodule in the RIGHT upper lobe unchanged since the prior study. Attention on follow-up. ?4. M

## 2021-07-13 NOTE — Patient Instructions (Signed)
[facility]COL[facility]  Discharge Instructions: ?Thank you for choosing [facility] to provide your oncology and hematology care.  ? ?If you have a lab appointment with the Cancer Center, please go directly to the Cancer Center and check in at the registration area. ?  ?Wear comfortable clothing and clothing appropriate for easy access to any Portacath or PICC line.  ? ?We strive to give you quality time with your provider. You may need to reschedule your appointment if you arrive late (15 or more minutes).  Arriving late affects you and other patients whose appointments are after yours.  Also, if you miss three or more appointments without notifying the office, you may be dismissed from the clinic at the provider?s discretion.    ?  ?For prescription refill requests, have your pharmacy contact our office and allow 72 hours for refills to be completed.   ? ?Today you received the following chemotherapy and/or immunotherapy agent: Zirabev (bevacizumab-bvzr)    ?  ?To help prevent nausea and vomiting after your treatment, we encourage you to take your nausea medication as directed. ? ?BELOW ARE SYMPTOMS THAT SHOULD BE REPORTED IMMEDIATELY: ?*FEVER GREATER THAN 100.4 F (38 ?C) OR HIGHER ?*CHILLS OR SWEATING ?*NAUSEA AND VOMITING THAT IS NOT CONTROLLED WITH YOUR NAUSEA MEDICATION ?*UNUSUAL SHORTNESS OF BREATH ?*UNUSUAL BRUISING OR BLEEDING ?*URINARY PROBLEMS (pain or burning when urinating, or frequent urination) ?*BOWEL PROBLEMS (unusual diarrhea, constipation, pain near the anus) ?TENDERNESS IN MOUTH AND THROAT WITH OR WITHOUT PRESENCE OF ULCERS (sore throat, sores in mouth, or a toothache) ?UNUSUAL RASH, SWELLING OR PAIN  ?UNUSUAL VAGINAL DISCHARGE OR ITCHING  ? ?Items with * indicate a potential emergency and should be followed up as soon as possible or go to the Emergency Department if any problems should occur. ? ?Please show the CHEMOTHERAPY ALERT CARD or IMMUNOTHERAPY ALERT CARD  at check-in to the Emergency Department and triage nurse. ? ?Should you have questions after your visit or need to cancel or reschedule your appointment, please contact [facility]  Dept: [phone]  and follow the prompts.  Office hours are 8:00 a.m. to 4:30 p.m. Monday - Friday. Please note that voicemails left after 4:00 p.m. may not be returned until the following business day.  We are closed weekends and major holidays. You have access to a nurse at all times for urgent questions. Please call the main number to the clinic Dept: [phone] and follow the prompts. ? ? ?For any non-urgent questions, you may also contact your provider using MyChart. We now offer e-Visits for anyone 18 and older to request care online for non-urgent symptoms. For details visit [email or web address]. ?  ?Also download the MyChart app! Go to the app store, search "MyChart", open the app, select [facility], and log in with your MyChart username and password. ? ?Due to Covid, a mask is required upon entering the hospital/clinic. If you do not have a mask, one will be given to you upon arrival. For doctor visits, patients may have 1 support person aged 18 or older with them. For treatment visits, patients cannot have anyone with them due to current Covid guidelines and our immunocompromised population.  ? ?

## 2021-07-16 ENCOUNTER — Telehealth: Payer: Self-pay | Admitting: Internal Medicine

## 2021-07-16 NOTE — Telephone Encounter (Signed)
. ?  Medicaid Managed Care  ? ?Unsuccessful Outreach Note ? ?07/16/2021 ?Name: Casey Black MRN: [id number] DOB: May 27, 1960 ? ?Referred by: [name], [name], MD ?Reason for referral : High Risk Managed Medicaid (I called the patient today to get her scheduled with the MM Team. Number rang once and then it was disconnected. Called back and it did the same thing.) ? ? ?Third unsuccessful telephone outreach was attempted today. The patient was referred to the case management team for assistance with care management and care coordination. The patient's primary care provider has been notified of our unsuccessful attempts to make or maintain contact with the patient. The care management team is pleased to engage with this patient at any time in the future should he/she be interested in assistance from the care management team.  ? ?Follow Up Plan: We have been unable to make contact with the patient for follow up. The care management team is available to follow up with the patient after provider conversation with the patient regarding recommendation for care management engagement and subsequent re-referral to the care management team.  ? ? ?Reita Chard ?Care Guide, High Risk Medicaid Managed Care ?Embedded Care Coordination ?[place]  ? ? ?SIGNATURE  ?

## 2021-07-22 ENCOUNTER — Telehealth: Payer: Self-pay

## 2021-07-22 ENCOUNTER — Other Ambulatory Visit: Payer: Self-pay | Admitting: Hematology

## 2021-07-22 ENCOUNTER — Other Ambulatory Visit: Payer: Self-pay | Admitting: Internal Medicine

## 2021-07-22 DIAGNOSIS — E559 Vitamin D deficiency, unspecified: Secondary | ICD-10-CM

## 2021-07-22 NOTE — Telephone Encounter (Signed)
Pt's Home Health agency called stating they sent [place] the incorrect form to fill out for pt's insurance; therefore, the pt's insurance w/[employer] denial the application.  Casey Black [phone]) stated she will refax Dr. Ernestina Penna office the correct form needed.  [place] fax. ?

## 2021-07-23 ENCOUNTER — Other Ambulatory Visit (HOSPITAL_COMMUNITY): Payer: Self-pay

## 2021-07-24 ENCOUNTER — Other Ambulatory Visit (HOSPITAL_COMMUNITY): Payer: Self-pay

## 2021-07-27 ENCOUNTER — Other Ambulatory Visit (HOSPITAL_COMMUNITY): Payer: Self-pay

## 2021-07-28 ENCOUNTER — Other Ambulatory Visit (HOSPITAL_COMMUNITY): Payer: Self-pay

## 2021-08-03 ENCOUNTER — Inpatient Hospital Stay: Payer: Medicare Other | Attending: Hematology

## 2021-08-03 ENCOUNTER — Other Ambulatory Visit: Payer: Self-pay

## 2021-08-03 ENCOUNTER — Inpatient Hospital Stay (HOSPITAL_BASED_OUTPATIENT_CLINIC_OR_DEPARTMENT_OTHER): Payer: Medicare Other | Admitting: Hematology

## 2021-08-03 ENCOUNTER — Inpatient Hospital Stay: Payer: Medicare Other

## 2021-08-03 ENCOUNTER — Encounter: Payer: Self-pay | Admitting: Hematology

## 2021-08-03 VITALS — BP 155/62 | HR 54 | Temp 98.0°F | Resp 18 | Ht 67.0 in | Wt 246.9 lb

## 2021-08-03 DIAGNOSIS — C189 Malignant neoplasm of colon, unspecified: Secondary | ICD-10-CM

## 2021-08-03 DIAGNOSIS — C184 Malignant neoplasm of transverse colon: Secondary | ICD-10-CM | POA: Diagnosis present

## 2021-08-03 DIAGNOSIS — C787 Secondary malignant neoplasm of liver and intrahepatic bile duct: Secondary | ICD-10-CM | POA: Diagnosis present

## 2021-08-03 DIAGNOSIS — Z5112 Encounter for antineoplastic immunotherapy: Secondary | ICD-10-CM | POA: Insufficient documentation

## 2021-08-03 DIAGNOSIS — Z95828 Presence of other vascular implants and grafts: Secondary | ICD-10-CM

## 2021-08-03 LAB — CBC WITH DIFFERENTIAL/PLATELET
Abs Immature Granulocytes: 0.02 10*3/uL (ref 0.00–0.07)
Basophils Absolute: 0.1 10*3/uL (ref 0.0–0.1)
Basophils Relative: 1 %
Eosinophils Absolute: 0.2 10*3/uL (ref 0.0–0.5)
Eosinophils Relative: 3 %
HCT: 35.2 % — ABNORMAL LOW (ref 36.0–46.0)
Hemoglobin: 11.4 g/dL — ABNORMAL LOW (ref 12.0–15.0)
Immature Granulocytes: 0 %
Lymphocytes Relative: 28 %
Lymphs Abs: 2.1 10*3/uL (ref 0.7–4.0)
MCH: 33.6 pg (ref 26.0–34.0)
MCHC: 32.4 g/dL (ref 30.0–36.0)
MCV: 103.8 fL — ABNORMAL HIGH (ref 80.0–100.0)
Monocytes Absolute: 0.6 10*3/uL (ref 0.1–1.0)
Monocytes Relative: 8 %
Neutro Abs: 4.5 10*3/uL (ref 1.7–7.7)
Neutrophils Relative %: 60 %
Platelets: 160 10*3/uL (ref 150–400)
RBC: 3.39 MIL/uL — ABNORMAL LOW (ref 3.87–5.11)
RDW: 17.2 % — ABNORMAL HIGH (ref 11.5–15.5)
WBC: 7.4 10*3/uL (ref 4.0–10.5)
nRBC: 0 % (ref 0.0–0.2)

## 2021-08-03 LAB — COMPREHENSIVE METABOLIC PANEL
ALT: 11 U/L (ref 0–44)
AST: 22 U/L (ref 15–41)
Albumin: 3.8 g/dL (ref 3.5–5.0)
Alkaline Phosphatase: 86 U/L (ref 38–126)
Anion gap: 3 — ABNORMAL LOW (ref 5–15)
BUN: 29 mg/dL — ABNORMAL HIGH (ref 8–23)
CO2: 23 mmol/L (ref 22–32)
Calcium: 9.2 mg/dL (ref 8.9–10.3)
Chloride: 108 mmol/L (ref 98–111)
Creatinine, Ser: 1.09 mg/dL — ABNORMAL HIGH (ref 0.44–1.00)
GFR, Estimated: 58 mL/min — ABNORMAL LOW (ref >60)
Glucose, Bld: 91 mg/dL (ref 70–99)
Potassium: 4.4 mmol/L (ref 3.5–5.1)
Sodium: 134 mmol/L — ABNORMAL LOW (ref 135–145)
Total Bilirubin: 0.6 mg/dL (ref 0.3–1.2)
Total Protein: 7.6 g/dL (ref 6.5–8.1)

## 2021-08-03 LAB — CEA (IN HOUSE-CHCC): CEA (CHCC-In House): 256.55 ng/mL — ABNORMAL HIGH (ref 0.00–5.00)

## 2021-08-03 LAB — TOTAL PROTEIN, URINE DIPSTICK: Protein, ur: NEGATIVE mg/dL

## 2021-08-03 MED ORDER — SODIUM CHLORIDE 0.9% FLUSH
10.0000 mL | INTRAVENOUS | Status: DC | PRN
  Administered 2021-08-03: 10 mL

## 2021-08-03 MED ORDER — SODIUM CHLORIDE 0.9 % IV SOLN
7.5000 mg/kg | Freq: Once | INTRAVENOUS | Status: AC
  Administered 2021-08-03: 800 mg via INTRAVENOUS
  Filled 2021-08-03: qty 32

## 2021-08-03 MED ORDER — HEPARIN SOD (PORK) LOCK FLUSH 100 UNIT/ML IV SOLN
500.0000 [IU] | Freq: Once | INTRAVENOUS | Status: AC | PRN
  Administered 2021-08-03: 500 [IU]

## 2021-08-03 MED ORDER — SODIUM CHLORIDE 0.9 % IV SOLN: Freq: Once | INTRAVENOUS | Status: AC

## 2021-08-03 NOTE — Patient Instructions (Signed)
[facility] MEDICAL [facility]COLOG[facility]   °Discharge Instructions: °Thank you for choosing [facility] to provide your oncology and hematology care.  ° °If you have a lab appointment with the Cancer Center, please go directly to the Cancer Center and check in at the registration area. °  °Wear comfortable clothing and clothing appropriate for easy access to any Portacath or PICC line.  ° °We strive to give you quality time with your provider. You may need to reschedule your appointment if you arrive late (15 or more minutes).  Arriving late affects you and other patients whose appointments are after yours.  Also, if you miss three or more appointments without notifying the office, you may be dismissed from the clinic at the provider’s discretion.    °  °For prescription refill requests, have your pharmacy contact our office and allow 72 hours for refills to be completed.   ° °Today you received the following chemotherapy and/or immunotherapy agents: avastin  °  °To help prevent nausea and vomiting after your treatment, we encourage you to take your nausea medication as directed. ° °BELOW ARE SYMPTOMS THAT SHOULD BE REPORTED IMMEDIATELY: °*FEVER GREATER THAN 100.4 F (38 °C) OR HIGHER °*CHILLS OR SWEATING °*NAUSEA AND VOMITING THAT IS NOT CONTROLLED WITH YOUR NAUSEA MEDICATION °*UNUSUAL SHORTNESS OF BREATH °*UNUSUAL BRUISING OR BLEEDING °*URINARY PROBLEMS (pain or burning when urinating, or frequent urination) °*BOWEL PROBLEMS (unusual diarrhea, constipation, pain near the anus) °TENDERNESS IN MOUTH AND THROAT WITH OR WITHOUT PRESENCE OF ULCERS (sore throat, sores in mouth, or a toothache) °UNUSUAL RASH, SWELLING OR PAIN  °UNUSUAL VAGINAL DISCHARGE OR ITCHING  ° °Items with * indicate a potential emergency and should be followed up as soon as possible or go to the Emergency Department if any problems should occur. ° °Please show the CHEMOTHERAPY ALERT CARD or IMMUNOTHERAPY ALERT CARD at check-in to  the Emergency Department and triage nurse. ° °Should you have questions after your visit or need to cancel or reschedule your appointment, please contact [facility]  Dept: [phone]  and follow the prompts.  Office hours are 8:00 a.m. to 4:30 p.m. Monday - Friday. Please note that voicemails left after 4:00 p.m. may not be returned until the following business day.  We are closed weekends and major holidays. You have access to a nurse at all times for urgent questions. Please call the main number to the clinic Dept: [phone] and follow the prompts. ° ° °For any non-urgent questions, you may also contact your provider using MyChart. We now offer e-Visits for anyone 18 and older to request care online for non-urgent symptoms. For details visit [email or web address]. °  °Also download the MyChart app! Go to the app store, search "MyChart", open the app, select , and log in with your MyChart username and password. ° °Due to Covid, a mask is required upon entering the hospital/clinic. If you do not have a mask, one will be given to you upon arrival. For doctor visits, patients may have 1 support person aged 18 or older with them. For treatment visits, patients cannot have anyone with them due to current Covid guidelines and our immunocompromised population.  ° °

## 2021-08-03 NOTE — Progress Notes (Signed)
?[facility]   ?[phone] [phone]   ?Clinic Follow up Note  ? ?Patient Care Team: ?[name], [name], MD as PCP - General (Internal Medicine) ?Clovis Riley, MD as Consulting Physician (General Surgery) ?Truitt Merle, MD as Consulting Physician (Hematology) ?[name], NP as Nurse Practitioner (Nurse Practitioner) ? ?Date of Service:  08/03/2021 ? ?CHIEF COMPLAINT: f/u of metastatic colon cancer ? ?CURRENT THERAPY:  ?Maintenance Avastin and Xeloda, q3weeks, starting 01/21/20 ?            -Xeloda dose: 2064m q12h on day 1-14 ? ?ASSESSMENT & PLAN:  ?MTrula Fredeis a 61y.o. female with  ? ?1. 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 ?-PET from 02/26/19 shows known liver metastasis and metastatic lymphadenopathy in chest and right axilla. ?-[name] shows MSI stable disease, Kras+ No targetable mutation, she is not a candidate for EGFR or immunotherapy. ?-Began palliative first line chemo on 03/12/2019, received 5FU/leuc with first 2 cycles for large open abdominal wound after surgery. She started full dose FOLFOX and avastin with cycle 2. Due to neuropathy Oxaliplatin was stopped after 09/24/19. ?-For convenience she was switched to oral chemo Xeloda 2 weeks on/1 week off and Bev every 3 weeks in 01/21/20. ?-restaging CT 06/09/21 showed stable liver mets in the mild mediastinal adenopathy, no new lesions or evidence of progression.  ?-I reviewed my plan to get a PET scan in early-mid June. If she has limited disease in liver and mediastinal lymph nodes, I will do her case in our GI tumor conference to see if she is a candidate for local therapy ?-She is clinically doing well. Labs reviewed, overall stable, urine protein negative.  Continue Xeloda and bevacizumab maintenance therapy. She is tolerating very well with only mild skin toxicity. ?-[place] Thus, we will delay her treatment by a week. ?  ?2. Chemotherapy induced peripheral neuropathy (CIPN), G1 ?-secondary to Oxaliplatin, developed after cycle 12 FOLFOX. Oxaliplatin d/c after C15 on 09/24/19 ?-Neuropathy in fingers and feet are moderate and stable with mild decreased function in fingers.  ?-She is currently on gabapentin 100 mg AM and 300 mg qHS and titrate up if needed.  ?-overall improved, no impact on her function now. Will continue to monitor.  ?  ?3. 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 Will give IV iron if needed  ?-Hgb 11.4 today (08/03/21) ?  ?4. HTN, uncontrolled  ?-on hydralazine, isosorbide, lisinopril, metoprolol, amlodipine and HCTZ  ?-Will monitor on bevacizumab.  ?  ?5. Goals of care discussion, Social support ?-The patient understands the goal of care is palliative. She is full code. ?-She worked in [employer] 2 different jobs but has been on disability since her diagnosis. ?  ?9. COVID (+) 05/01/20 ?-She received treatment infusion on 05/06/20. Symptoms overall resolved after treatment.  ?  ?  ?PLAN: ?-proceed with bevacizumab today  ?-continue Xeloda, she started a new cycle this morning  ?-lab, flush, f/u, and beva in 4 week (postpone for a week due to travel) ? ? ?No problem-specific Assessment & Plan notes found for this encounter. ? ? ?SUMMARY OF ONCOLOGIC HISTORY: ?Oncology History  Overview Note  ?Cancer Staging ?Adenocarcinoma of colon metastatic to liver [facility]) ?Staging form: Colon and Rectum, AJCC 8th Edition ?- Pathologic stage from 01/24/2019: Stage IVA (pT4a, pN1a, pM1a) - Signed by [name], NP on 02/14/2019 ? ?  ?Adenocarcinoma of colon metastatic to liver [facility])  ?01/23/2019 Imaging  ? ABD Xray  IMPRESSION: ?1. Bowel-gas pattern consistent with small bowel obstruction. No ?free air. ?2. No acute chest findings. ?  ?01/23/2019 Imaging  ? CT AP IMPRESSION: ?Obstructing mid transverse colonic mass with mild regional ?adenopathy and hepatic metastatic disease. The mass likely extends ?through the serosa; no ascites or peritoneal nodularity. ?  ?01/24/2019 Surgery  ? Surgeon: Clovis Riley MD ?Assistant: [name] ?Procedure performed: Transverse colectomy with end colostomy, liver biopsy ?Procedure classification: URGENT/EMERGENT ?Preop diagnosis: Obstructing, metastatic transverse colon mass ?Post-op diagnosis/intraop findings: Same ?  ?01/24/2019 Pathology Results  ? FINAL MICROSCOPIC DIAGNOSIS:  ? ?A. COLON, TRANSVERSE, RESECTION:  ?Colonic adenocarcinoma, 5 cm.  ?Carcinoma extends into pericolonic connective tissue and focally to  ?serosal surface.  ?Margins not involved.  ?Metastatic carcinoma in one of thirteen lymph nodes (1/13).  ? ?B. LIVER NODULE, LEFT, BIOPSY:  ?Metastatic adenocarcinoma.  ?  ?01/24/2019 Cancer Staging  ? Staging form: Colon and Rectum, AJCC 8th Edition ?- Pathologic stage from 01/24/2019: Stage IVA (pT4a, pN1a, pM1a) - Signed by [name], NP on 02/14/2019 ? ?  ?02/02/2019 Initial Diagnosis  ? Adenocarcinoma of colon metastatic to liver [facility]) ?  ?02/26/2019 PET scan  ? IMPRESSION: ?1. Hypermetabolic metastatic disease in the liver and ?mediastinal/hilar/axillary lymph nodes. ?2. Focal hypermetabolism in the rectum. Continued attention on ?follow-up exams is warranted. ?3. Focal hypermetabolism medial to the right adrenal gland may be ?within a metastatic lymph node, better visualized on 01/23/2019. ?4. Aortic atherosclerosis (ICD10-170.0). Coronary artery ?calcification. ?  ?03/12/2019 -  Chemotherapy  ? She started 5FU q2weeks on 03/12/19 for 2 cycles. She started full dose FOLFOX with Avastin on 04/09/19. Oxaliplatin dose reduced repeatedly due to neuropathy C12 and  held since C16 on 10/09/19. Now on maintenance Avastin and 5FU q2weeks since 10/09/19 ?      -Maintenance change to maintenance xeloda 2000 mg BID days 1-14 q21 days and q3 weeks Zirabev (15 mg/kg) starting 01/21/20. First cycle was taken 1073m BID due to misunderstanding.  ?  ?05/31/2019 Imaging  ? Restaging CT CAP IMPRESSION: ?1. Similar to mild interval decrease in size of multiple hepatic lesions, partially calcified. ?2. 2 mm right upper lobe pulmonary nodule. Recommend attention on follow-up. ?3. Emphysema and aortic atherosclerosis. ?  ?08/23/2019 Imaging  ? CT CAP w contrast  ?IMPRESSION: ?1. The dominant peripheral right liver metastasis has mildly ?increased. Other smaller liver metastases are stable. ?2. Otherwise no new or progressive metastatic disease in the chest, ?abdomen or pelvis. ?3. Aortic Atherosclerosis (ICD10-I70.0) and Emphysema (ICD10-J43.9). ?  ?11/27/2019 Imaging  ? CT CAP w contrast  ?IMPRESSION: ?Status post transverse colectomy with right mid abdominal colostomy. ?  ?Mildly progressive hepatic metastases, as above. ?  ?No evidence of metastatic disease in the chest. Small mediastinal ?lymph nodes are within normal limits. ?  ?Additional stable ancillary findings as above. ?  ?02/21/2020 Imaging  ? IMPRESSION: ?1. Stable hepatic metastatic disease. ?2. Aortic atherosclerosis (ICD10-I70.0). Coronary artery ?calcification. ?3.  Emphysema (ICD10-J43.9). ?  ?05/19/2020 Imaging  ? CT CAP  ?IMPRESSION: ?1. Multiple partially calcified liver metastases are again noted. ?With the exception of a small lesion in segment 7/8 lesions are not ?significantly  changed in the interval. No new liver lesions ?identified. ?2. Coronary artery atherosclerotic calcifications. ?3. Aortic atherosclerosis. ?4. 2 mm right upper lobe lung nodule identified.  Unchanged. ?  ?Aortic Atherosclerosis (ICD10-I70.0). ?  ?11/17/2020 Imaging  ? CT CAP ? ?IMPRESSION: ?1. Partially calcified lesions throughout the liver are  stable ?accounting for differences in technique, contrasted imaging on ?today's study is compared to noncontrast imaging on the prior. ?2. No new hepatic lesions. ?3. Tiny 3 mm pulmonary nodule in the RIGHT upper l

## 2021-08-10 ENCOUNTER — Other Ambulatory Visit (INDEPENDENT_AMBULATORY_CARE_PROVIDER_SITE_OTHER): Payer: Medicare Other

## 2021-08-10 ENCOUNTER — Other Ambulatory Visit: Payer: Self-pay | Admitting: Internal Medicine

## 2021-08-10 ENCOUNTER — Other Ambulatory Visit: Payer: Self-pay

## 2021-08-10 DIAGNOSIS — E559 Vitamin D deficiency, unspecified: Secondary | ICD-10-CM

## 2021-08-10 LAB — VITAMIN D 25 HYDROXY (VIT D DEFICIENCY, FRACTURES): VITD: 21.6 ng/mL — ABNORMAL LOW (ref 30.00–100.00)

## 2021-08-10 MED ORDER — VITAMIN D (ERGOCALCIFEROL) 1.25 MG (50000 UNIT) PO CAPS: 50000.0000 [IU] | ORAL_CAPSULE | ORAL | 0 refills | Status: AC

## 2021-08-20 ENCOUNTER — Other Ambulatory Visit (HOSPITAL_COMMUNITY): Payer: Self-pay

## 2021-09-01 ENCOUNTER — Inpatient Hospital Stay (HOSPITAL_BASED_OUTPATIENT_CLINIC_OR_DEPARTMENT_OTHER): Payer: Medicare Other | Admitting: Adult Health

## 2021-09-01 ENCOUNTER — Inpatient Hospital Stay: Payer: Medicare Other

## 2021-09-01 ENCOUNTER — Other Ambulatory Visit: Payer: Self-pay

## 2021-09-01 ENCOUNTER — Encounter: Payer: Self-pay | Admitting: Adult Health

## 2021-09-01 VITALS — BP 148/58 | HR 46 | Temp 97.7°F | Resp 18 | Ht 67.0 in | Wt 248.2 lb

## 2021-09-01 DIAGNOSIS — C787 Secondary malignant neoplasm of liver and intrahepatic bile duct: Secondary | ICD-10-CM | POA: Diagnosis not present

## 2021-09-01 DIAGNOSIS — Z95828 Presence of other vascular implants and grafts: Secondary | ICD-10-CM

## 2021-09-01 DIAGNOSIS — Z5112 Encounter for antineoplastic immunotherapy: Secondary | ICD-10-CM | POA: Diagnosis not present

## 2021-09-01 DIAGNOSIS — C189 Malignant neoplasm of colon, unspecified: Secondary | ICD-10-CM

## 2021-09-01 LAB — COMPREHENSIVE METABOLIC PANEL
ALT: 11 U/L (ref 0–44)
AST: 17 U/L (ref 15–41)
Albumin: 3.7 g/dL (ref 3.5–5.0)
Alkaline Phosphatase: 81 U/L (ref 38–126)
Anion gap: 3 — ABNORMAL LOW (ref 5–15)
BUN: 20 mg/dL (ref 8–23)
CO2: 25 mmol/L (ref 22–32)
Calcium: 9.8 mg/dL (ref 8.9–10.3)
Chloride: 110 mmol/L (ref 98–111)
Creatinine, Ser: 1.01 mg/dL — ABNORMAL HIGH (ref 0.44–1.00)
GFR, Estimated: >60 mL/min (ref >60)
Glucose, Bld: 96 mg/dL (ref 70–99)
Potassium: 4 mmol/L (ref 3.5–5.1)
Sodium: 137 mmol/L (ref 135–145)
Total Bilirubin: 0.5 mg/dL (ref 0.3–1.2)
Total Protein: 7.4 g/dL (ref 6.5–8.1)

## 2021-09-01 LAB — CBC WITH DIFFERENTIAL/PLATELET
Abs Immature Granulocytes: 0.02 10*3/uL (ref 0.00–0.07)
Basophils Absolute: 0.1 10*3/uL (ref 0.0–0.1)
Basophils Relative: 1 %
Eosinophils Absolute: 0.2 10*3/uL (ref 0.0–0.5)
Eosinophils Relative: 3 %
HCT: 34.9 % — ABNORMAL LOW (ref 36.0–46.0)
Hemoglobin: 11.5 g/dL — ABNORMAL LOW (ref 12.0–15.0)
Immature Granulocytes: 0 %
Lymphocytes Relative: 31 %
Lymphs Abs: 2.3 10*3/uL (ref 0.7–4.0)
MCH: 34 pg (ref 26.0–34.0)
MCHC: 33 g/dL (ref 30.0–36.0)
MCV: 103.3 fL — ABNORMAL HIGH (ref 80.0–100.0)
Monocytes Absolute: 0.8 10*3/uL (ref 0.1–1.0)
Monocytes Relative: 10 %
Neutro Abs: 4.1 10*3/uL (ref 1.7–7.7)
Neutrophils Relative %: 55 %
Platelets: 156 10*3/uL (ref 150–400)
RBC: 3.38 MIL/uL — ABNORMAL LOW (ref 3.87–5.11)
RDW: 17.1 % — ABNORMAL HIGH (ref 11.5–15.5)
WBC: 7.5 10*3/uL (ref 4.0–10.5)
nRBC: 0 % (ref 0.0–0.2)

## 2021-09-01 LAB — TOTAL PROTEIN, URINE DIPSTICK: Protein, ur: NEGATIVE mg/dL

## 2021-09-01 LAB — CEA (IN HOUSE-CHCC): CEA (CHCC-In House): 286.81 ng/mL — ABNORMAL HIGH (ref 0.00–5.00)

## 2021-09-01 MED ORDER — SODIUM CHLORIDE 0.9 % IV SOLN
7.5000 mg/kg | Freq: Once | INTRAVENOUS | Status: AC
  Administered 2021-09-01: 800 mg via INTRAVENOUS
  Filled 2021-09-01: qty 32

## 2021-09-01 MED ORDER — SODIUM CHLORIDE 0.9% FLUSH
10.0000 mL | INTRAVENOUS | Status: DC | PRN
  Administered 2021-09-01: 10 mL

## 2021-09-01 MED ORDER — HEPARIN SOD (PORK) LOCK FLUSH 100 UNIT/ML IV SOLN
500.0000 [IU] | Freq: Once | INTRAVENOUS | Status: AC | PRN
  Administered 2021-09-01: 500 [IU]

## 2021-09-01 MED ORDER — SODIUM CHLORIDE 0.9 % IV SOLN: Freq: Once | INTRAVENOUS | Status: AC

## 2021-09-01 NOTE — Progress Notes (Unsigned)
[name] Follow up:    Casey Black, Casey Halsted, MD [place] [address]   DIAGNOSIS: Cancer Staging  Adenocarcinoma of colon metastatic to liver [facility]) Staging form: Colon and Rectum, AJCC 8th Edition - Pathologic stage from 01/24/2019: Stage IVA (pT4a, pN1a, pM1a) - Signed by [name], NP on 02/14/2019 Total positive nodes: 1 Histologic grading system: 4 grade system Histologic grade (G): G2 Lymph-vascular invasion (LVI): LVI present/identified, NOS Tumor deposits (TD): Absent Perineural invasion (PNI): Absent   SUMMARY OF ONCOLOGIC HISTORY: Oncology History Overview Note  Cancer Staging Adenocarcinoma of colon metastatic to liver [facility]) Staging form: Colon and Rectum, AJCC 8th Edition - Pathologic stage from 01/24/2019: Stage IVA (pT4a, pN1a, pM1a) - Signed by [name], NP on 02/14/2019    Adenocarcinoma of colon metastatic to liver ([place])  01/23/2019 Imaging   ABD Xray IMPRESSION: 1. Bowel-gas pattern consistent with small bowel obstruction. No free air. 2. No acute chest findings.   01/23/2019 Imaging   CT AP IMPRESSION: Obstructing mid transverse colonic mass with mild regional adenopathy and hepatic metastatic disease. The mass likely extends through the serosa; no ascites or peritoneal nodularity.   01/24/2019 Surgery   Surgeon: Clovis Riley MD Assistant: [name] Procedure performed: Transverse colectomy with end colostomy, liver biopsy Procedure classification: URGENT/EMERGENT Preop diagnosis: Obstructing, metastatic transverse colon mass Post-op diagnosis/intraop findings: Same   01/24/2019 Pathology Results   FINAL MICROSCOPIC DIAGNOSIS:   A. COLON, TRANSVERSE, RESECTION:  Colonic adenocarcinoma, 5 cm.  Carcinoma extends into pericolonic connective tissue and focally to  serosal surface.  Margins not involved.  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    B. LIVER NODULE, LEFT, BIOPSY:  Metastatic adenocarcinoma.    01/24/2019 Cancer Staging   Staging form: Colon and Rectum, AJCC 8th Edition - Pathologic stage from 01/24/2019: Stage IVA (pT4a, pN1a, pM1a) - Signed by [name], NP on 02/14/2019    02/02/2019 Initial Diagnosis   Adenocarcinoma of colon metastatic to liver ([place])   02/26/2019 PET scan   IMPRESSION: 1. Hypermetabolic metastatic disease in the liver and mediastinal/hilar/axillary lymph nodes. 2. Focal hypermetabolism in the rectum. Continued attention on follow-up exams is warranted. 3. Focal hypermetabolism medial to the right adrenal gland may be within a metastatic lymph node, better visualized on 01/23/2019. 4. Aortic atherosclerosis (ICD10-170.0). Coronary artery calcification.   03/12/2019 -  Chemotherapy   She started 5FU q2weeks on 03/12/19 for 2 cycles. She started full dose FOLFOX with Avastin on 04/09/19. Oxaliplatin dose reduced repeatedly due to neuropathy C12 and held since C16 on 10/09/19. Now on maintenance Avastin and 5FU q2weeks since 10/09/19       -Maintenance change to maintenance xeloda 2000 mg BID days 1-14 q21 days and q3 weeks Zirabev (15 mg/kg) starting 01/21/20. First cycle was taken '1000mg'$  BID due to misunderstanding.    05/31/2019 Imaging   Restaging CT CAP IMPRESSION: 1. Similar to mild interval decrease in size of multiple hepatic lesions, partially calcified. 2. 2 mm right upper lobe pulmonary nodule. Recommend attention on follow-up. 3. Emphysema and aortic atherosclerosis.   08/23/2019 Imaging   CT CAP w contrast  IMPRESSION: 1. The dominant peripheral right liver metastasis has mildly increased. Other smaller liver metastases are stable. 2. Otherwise no new or progressive metastatic disease in the chest, abdomen or pelvis. 3. Aortic Atherosclerosis (ICD10-I70.0) and Emphysema (ICD10-J43.9).   11/27/2019 Imaging   CT CAP w contrast  IMPRESSION: Status post  transverse colectomy  with right mid abdominal colostomy.   Mildly progressive hepatic metastases, as above.   No evidence of metastatic disease in the chest. Small mediastinal lymph nodes are within normal limits.   Additional stable ancillary findings as above.   02/21/2020 Imaging   IMPRESSION: 1. Stable hepatic metastatic disease. 2. Aortic atherosclerosis (ICD10-I70.0). Coronary artery calcification. 3.  Emphysema (ICD10-J43.9).   05/19/2020 Imaging   CT CAP  IMPRESSION: 1. Multiple partially calcified liver metastases are again noted. With the exception of a small lesion in segment 7/8 lesions are not significantly changed in the interval. No new liver lesions identified. 2. Coronary artery atherosclerotic calcifications. 3. Aortic atherosclerosis. 4. 2 mm right upper lobe lung nodule identified.  Unchanged.   Aortic Atherosclerosis (ICD10-I70.0).   11/17/2020 Imaging   CT CAP  IMPRESSION: 1. Partially calcified lesions throughout the liver are stable accounting for differences in technique, contrasted imaging on today's study is compared to noncontrast imaging on the prior. 2. No new hepatic lesions. 3. Tiny 3 mm pulmonary nodule in the RIGHT upper lobe unchanged since the prior study. Attention on follow-up. 4. Mild fullness of RIGHT paratracheal nodal tissue is minimally increased and borderline enlarged, attention on follow-up. 5. RIGHT lower quadrant colostomy. 6. 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 7. Aortic atherosclerosis.   06/09/2021 Imaging   EXAM: CT CHEST, ABDOMEN, AND PELVIS WITH CONTRAST  IMPRESSION: Stable calcified liver metastases.   Stable mildly enlarged right paratracheal lymph node.   No evidence of new or progressive metastatic disease.   Aortic Atherosclerosis (ICD10-I70.0).     CURRENT THERAPY: Avastin, xeloda  INTERVAL  HISTORY: Casey Black 61 y.o. female returns for f/u prior to receiving Avastin therapy.  She tells me she is tolerating this well and    Patient Active Problem List   Diagnosis Date Noted  . Vitamin D deficiency 05/12/2021  . Onychomycosis due to dermatophyte 06/11/2020  . Chemotherapy-induced peripheral neuropathy ([place]) 09/24/2019  . Trichomonas vaginalis infection 07/24/2019  . ASCUS of cervix with negative high risk HPV 07/24/2019  . Port-A-Cath in place 03/12/2019  . Goals of care, counseling/discussion 02/15/2019  . IDA (iron deficiency anemia) 02/02/2019  . Adenocarcinoma of colon metastatic to liver ([place]) 02/02/2019  . SBO (small bowel obstruction) ([place]) 01/23/2019  . Hypertension 01/23/2019  . Tobacco dependence 01/23/2019    has No Known Allergies.  MEDICAL HISTORY: Past Medical History:  Diagnosis Date  . Anemia    low iron  . Cancer [facility])    colon cancer  . Colon cancer ([place]) 01/2019  . Hypertension 01/23/2019  . Personal history of chemotherapy 01/2019   colon CA  . SBO (small bowel obstruction) ([place]) 01/23/2019    SURGICAL HISTORY: Past Surgical History:  Procedure Laterality Date  . CESAREAN SECTION     x2  . COLOSTOMY N/A 01/24/2019   Procedure: End Loop Colostomy;  Surgeon: Clovis Riley, MD;  Location: [facility];  Service: General;  Laterality: N/A;  . PARTIAL COLECTOMY N/A 01/24/2019   Procedure: PARTIAL COLECTOMY;  Surgeon: Clovis Riley, MD;  Location: [place];  Service: General;  Laterality: N/A;  . PORTACATH PLACEMENT Right 02/28/2019   Procedure: INSERTION PORT-A-CATH WITH ULTRASOUND GUIDANCE;  Surgeon: Clovis Riley, MD;  Location: [place];  Service: General;  Laterality: Right;    SOCIAL HISTORY: Social History   Socioeconomic History  . Marital  status: Married    Spouse name: Not on file  . Number of children: 2  . Years of education: Not on file  . Highest education level: Not on file  Occupational History  . Not on file   Tobacco Use  . Smoking status: Former    Packs/day: 0.50    Years: 29.00    Pack years: 14.50    Types: Cigarettes    Quit date: 01/04/2019    Years since quitting: 2.6  . Smokeless tobacco: Never  . Tobacco comments:    desires patch  Vaping Use  . Vaping Use: Never used  Substance and Sexual Activity  . Alcohol use: Not Currently    Comment: a pint a week  . Drug use: Never  . Sexual activity: Not on file  Other Topics Concern  . Not on file  Social History Narrative  . Not on file   Social Determinants of Health   Financial Resource Strain: Not on file  Food Insecurity: Not on file  Transportation Needs: Not on file  Physical Activity: Not on file  Stress: Not on file  Social Connections: Not on file  Intimate Partner Violence: Not on file    FAMILY HISTORY: Family History  Problem Relation Age of Onset  . Kidney failure Father   . Hypertension Father   . Hypertension Sister     Review of Systems - Oncology    PHYSICAL EXAMINATION  ECOG PERFORMANCE STATUS: {CHL ONC ECOG XB:[id number]}  There were no vitals filed for this visit.  Physical Exam  LABORATORY DATA:  CBC    Component Value Date/Time   WBC 7.5 09/01/2021 1318   RBC 3.38 (L) 09/01/2021 1318   HGB 11.5 (L) 09/01/2021 1318   HGB 10.5 (L) 12/12/2020 1306   HCT 34.9 (L) 09/01/2021 1318   PLT 156 09/01/2021 1318   PLT 183 12/12/2020 1306   MCV 103.3 (H) 09/01/2021 1318   MCH 34.0 09/01/2021 1318   MCHC 33.0 09/01/2021 1318   RDW 17.1 (H) 09/01/2021 1318   LYMPHSABS 2.3 09/01/2021 1318   MONOABS 0.8 09/01/2021 1318   EOSABS 0.2 09/01/2021 1318   BASOSABS 0.1 09/01/2021 1318    CMP     Component Value Date/Time   NA 137 09/01/2021 1318   K 4.0 09/01/2021 1318   CL 110 09/01/2021 1318   CO2 25 09/01/2021 1318   GLUCOSE 96 09/01/2021 1318   BUN 20 09/01/2021 1318   CREATININE 1.01 (H) 09/01/2021 1318   CREATININE 1.06 (H) 01/23/2021 1304   CALCIUM 9.8 09/01/2021 1318   PROT  7.4 09/01/2021 1318   ALBUMIN 3.7 09/01/2021 1318   AST 17 09/01/2021 1318   AST 18 01/23/2021 1304   ALT 11 09/01/2021 1318   ALT 11 01/23/2021 1304   ALKPHOS 81 09/01/2021 1318   BILITOT 0.5 09/01/2021 1318   BILITOT 0.3 01/23/2021 1304   GFRNONAA >60 09/01/2021 1318   GFRNONAA >60 01/23/2021 1304   GFRAA >60 01/07/2020 0957       PENDING LABS:   RADIOGRAPHIC STUDIES:  No results found.   PATHOLOGY:     ASSESSMENT and THERAPY PLAN:   No problem-specific Assessment & Plan notes found for this encounter.   No orders of the defined types were placed in this encounter.   All questions were answered. The patient knows to call the clinic with any problems, questions or concerns. We can certainly see the patient much sooner if necessary. This note was electronically signed.  Scot Dock, NP 09/01/2021

## 2021-09-01 NOTE — Patient Instructions (Signed)
[facility]COL[facility]  Discharge Instructions: °Thank you for choosing [facility] to provide your oncology and hematology care.  ° °If you have a lab appointment with the Cancer Center, please go directly to the Cancer Center and check in at the registration area. °  °Wear comfortable clothing and clothing appropriate for easy access to any Portacath or PICC line.  ° °We strive to give you quality time with your provider. You may need to reschedule your appointment if you arrive late (15 or more minutes).  Arriving late affects you and other patients whose appointments are after yours.  Also, if you miss three or more appointments without notifying the office, you may be dismissed from the clinic at the provider’s discretion.    °  °For prescription refill requests, have your pharmacy contact our office and allow 72 hours for refills to be completed.   ° °Today you received the following chemotherapy and/or immunotherapy agents: Bevacizumab.     °  °To help prevent nausea and vomiting after your treatment, we encourage you to take your nausea medication as directed. ° °BELOW ARE SYMPTOMS THAT SHOULD BE REPORTED IMMEDIATELY: °*FEVER GREATER THAN 100.4 F (38 °C) OR HIGHER °*CHILLS OR SWEATING °*NAUSEA AND VOMITING THAT IS NOT CONTROLLED WITH YOUR NAUSEA MEDICATION °*UNUSUAL SHORTNESS OF BREATH °*UNUSUAL BRUISING OR BLEEDING °*URINARY PROBLEMS (pain or burning when urinating, or frequent urination) °*BOWEL PROBLEMS (unusual diarrhea, constipation, pain near the anus) °TENDERNESS IN MOUTH AND THROAT WITH OR WITHOUT PRESENCE OF ULCERS (sore throat, sores in mouth, or a toothache) °UNUSUAL RASH, SWELLING OR PAIN  °UNUSUAL VAGINAL DISCHARGE OR ITCHING  ° °Items with * indicate a potential emergency and should be followed up as soon as possible or go to the Emergency Department if any problems should occur. ° °Please show the CHEMOTHERAPY ALERT CARD or IMMUNOTHERAPY ALERT CARD at check-in  to the Emergency Department and triage nurse. ° °Should you have questions after your visit or need to cancel or reschedule your appointment, please contact [facility]  Dept: [phone]  and follow the prompts.  Office hours are 8:00 a.m. to 4:30 p.m. Monday - Friday. Please note that voicemails left after 4:00 p.m. may not be returned until the following business day.  We are closed weekends and major holidays. You have access to a nurse at all times for urgent questions. Please call the main number to the clinic Dept: [phone] and follow the prompts. ° ° °For any non-urgent questions, you may also contact your provider using MyChart. We now offer e-Visits for anyone 18 and older to request care online for non-urgent symptoms. For details visit [email or web address]. °  °Also download the MyChart app! Go to the app store, search "MyChart", open the app, select [facility], and log in with your MyChart username and password. ° °Due to Covid, a mask is required upon entering the hospital/clinic. If you do not have a mask, one will be given to you upon arrival. For doctor visits, patients may have 1 support person aged 18 or older with them. For treatment visits, patients cannot have anyone with them due to current Covid guidelines and our immunocompromised population.  ° °

## 2021-09-02 ENCOUNTER — Telehealth: Payer: Self-pay | Admitting: Adult Health

## 2021-09-02 NOTE — Telephone Encounter (Signed)
Scheduled appointment per 5/30 los. Patient is aware.

## 2021-09-04 ENCOUNTER — Encounter: Payer: Self-pay | Admitting: Hematology

## 2021-09-04 NOTE — Assessment & Plan Note (Addendum)
Casey Black is a 61 year old woman who was diagnosed with metastatic colon cancer currently on treatment with capecitabine and bevacizumab.  1.  Metastatic colon cancer: Casey Black's labs today are stable she will proceed with her treatment today with bevacizumab.  She is tolerating this well.  She will continue on capecitabine 2 weeks on and 1 week off.  We discussed her most recent restaging scans and Dr. Iline Oven note which indicated the desire to proceed with PET scan.  I placed orders for this today to be evaluated in 1 week.  2.  Skin dryness.  Casey Black skin remains stable.  I recommended she continue to use moisturizer on her hands every day.    Casey Black will return in 3 weeks for labs, follow-up, and her next treatment.

## 2021-09-11 ENCOUNTER — Other Ambulatory Visit: Payer: Self-pay | Admitting: Internal Medicine

## 2021-09-11 ENCOUNTER — Other Ambulatory Visit (HOSPITAL_COMMUNITY): Payer: Self-pay

## 2021-09-11 DIAGNOSIS — I10 Essential (primary) hypertension: Secondary | ICD-10-CM

## 2021-09-14 ENCOUNTER — Other Ambulatory Visit (HOSPITAL_COMMUNITY): Payer: Self-pay

## 2021-09-14 ENCOUNTER — Ambulatory Visit (HOSPITAL_COMMUNITY)
Admission: RE | Admit: 2021-09-14 | Discharge: 2021-09-14 | Disposition: A | Discharge status: Home / Self Care | Payer: Medicare Other | Source: Ambulatory Visit | Attending: Adult Health | Admitting: Adult Health

## 2021-09-14 DIAGNOSIS — C787 Secondary malignant neoplasm of liver and intrahepatic bile duct: Secondary | ICD-10-CM | POA: Insufficient documentation

## 2021-09-14 DIAGNOSIS — C189 Malignant neoplasm of colon, unspecified: Secondary | ICD-10-CM | POA: Diagnosis present

## 2021-09-14 LAB — GLUCOSE, CAPILLARY: Glucose-Capillary: 86 mg/dL (ref 70–99)

## 2021-09-14 MED ORDER — FLUDEOXYGLUCOSE F - 18 (FDG) INJECTION
12.4000 | Freq: Once | INTRAVENOUS | Status: AC
  Administered 2021-09-14: 12.3 via INTRAVENOUS

## 2021-09-16 ENCOUNTER — Telehealth: Payer: Self-pay

## 2021-09-16 ENCOUNTER — Other Ambulatory Visit (HOSPITAL_COMMUNITY): Payer: Self-pay

## 2021-09-16 ENCOUNTER — Other Ambulatory Visit: Payer: Self-pay | Admitting: Hematology

## 2021-09-16 DIAGNOSIS — C189 Malignant neoplasm of colon, unspecified: Secondary | ICD-10-CM

## 2021-09-16 MED ORDER — CAPECITABINE 500 MG PO TABS
2000.0000 mg | ORAL_TABLET | Freq: Two times a day (BID) | ORAL | 2 refills | Status: DC
  Filled 2021-09-16: qty 112, 21d supply, fill #0
  Filled 2021-09-16: qty 112, 14d supply, fill #0
  Filled 2021-10-09 – 2021-10-12 (×3): qty 112, 21d supply, fill #1

## 2021-09-16 NOTE — Telephone Encounter (Signed)
Spoke with pt via telephone regarding [facility] pharmacy has been trying to contact the pt regarding her Capecitabine refill.  Pt stated she will contact the [facility] regarding her Xeloda refill.  Pt had no further questions or concerns at that this time.

## 2021-09-21 ENCOUNTER — Other Ambulatory Visit (HOSPITAL_COMMUNITY): Payer: Self-pay

## 2021-09-22 ENCOUNTER — Inpatient Hospital Stay: Payer: Medicare Other | Attending: Hematology

## 2021-09-22 ENCOUNTER — Inpatient Hospital Stay: Payer: Medicare Other

## 2021-09-22 ENCOUNTER — Inpatient Hospital Stay (HOSPITAL_BASED_OUTPATIENT_CLINIC_OR_DEPARTMENT_OTHER): Payer: Medicare Other | Admitting: Hematology

## 2021-09-22 ENCOUNTER — Other Ambulatory Visit (HOSPITAL_COMMUNITY): Payer: Self-pay

## 2021-09-22 ENCOUNTER — Other Ambulatory Visit: Payer: Self-pay

## 2021-09-22 ENCOUNTER — Encounter: Payer: Self-pay | Admitting: Hematology

## 2021-09-22 VITALS — BP 152/61 | HR 45 | Temp 98.2°F | Resp 18 | Wt 245.5 lb

## 2021-09-22 DIAGNOSIS — C189 Malignant neoplasm of colon, unspecified: Secondary | ICD-10-CM

## 2021-09-22 DIAGNOSIS — C184 Malignant neoplasm of transverse colon: Secondary | ICD-10-CM | POA: Diagnosis present

## 2021-09-22 DIAGNOSIS — Z5112 Encounter for antineoplastic immunotherapy: Secondary | ICD-10-CM | POA: Diagnosis present

## 2021-09-22 DIAGNOSIS — Z79899 Other long term (current) drug therapy: Secondary | ICD-10-CM | POA: Diagnosis not present

## 2021-09-22 DIAGNOSIS — I10 Essential (primary) hypertension: Secondary | ICD-10-CM | POA: Insufficient documentation

## 2021-09-22 DIAGNOSIS — C787 Secondary malignant neoplasm of liver and intrahepatic bile duct: Secondary | ICD-10-CM

## 2021-09-22 DIAGNOSIS — C771 Secondary and unspecified malignant neoplasm of intrathoracic lymph nodes: Secondary | ICD-10-CM | POA: Diagnosis not present

## 2021-09-22 DIAGNOSIS — Z95828 Presence of other vascular implants and grafts: Secondary | ICD-10-CM

## 2021-09-22 LAB — CBC WITH DIFFERENTIAL/PLATELET
Abs Immature Granulocytes: 0.02 10*3/uL (ref 0.00–0.07)
Basophils Absolute: 0 10*3/uL (ref 0.0–0.1)
Basophils Relative: 1 %
Eosinophils Absolute: 0.2 10*3/uL (ref 0.0–0.5)
Eosinophils Relative: 4 %
HCT: 36.5 % (ref 36.0–46.0)
Hemoglobin: 12 g/dL (ref 12.0–15.0)
Immature Granulocytes: 0 %
Lymphocytes Relative: 29 %
Lymphs Abs: 1.9 10*3/uL (ref 0.7–4.0)
MCH: 34 pg (ref 26.0–34.0)
MCHC: 32.9 g/dL (ref 30.0–36.0)
MCV: 103.4 fL — ABNORMAL HIGH (ref 80.0–100.0)
Monocytes Absolute: 0.6 10*3/uL (ref 0.1–1.0)
Monocytes Relative: 10 %
Neutro Abs: 3.7 10*3/uL (ref 1.7–7.7)
Neutrophils Relative %: 56 %
Platelets: 178 10*3/uL (ref 150–400)
RBC: 3.53 MIL/uL — ABNORMAL LOW (ref 3.87–5.11)
RDW: 16.6 % — ABNORMAL HIGH (ref 11.5–15.5)
WBC: 6.6 10*3/uL (ref 4.0–10.5)
nRBC: 0 % (ref 0.0–0.2)

## 2021-09-22 LAB — COMPREHENSIVE METABOLIC PANEL
ALT: 8 U/L (ref 0–44)
AST: 17 U/L (ref 15–41)
Albumin: 3.8 g/dL (ref 3.5–5.0)
Alkaline Phosphatase: 77 U/L (ref 38–126)
Anion gap: 3 — ABNORMAL LOW (ref 5–15)
BUN: 23 mg/dL (ref 8–23)
CO2: 24 mmol/L (ref 22–32)
Calcium: 9.8 mg/dL (ref 8.9–10.3)
Chloride: 110 mmol/L (ref 98–111)
Creatinine, Ser: 1 mg/dL (ref 0.44–1.00)
GFR, Estimated: >60 mL/min (ref >60)
Glucose, Bld: 85 mg/dL (ref 70–99)
Potassium: 4.2 mmol/L (ref 3.5–5.1)
Sodium: 137 mmol/L (ref 135–145)
Total Bilirubin: 0.5 mg/dL (ref 0.3–1.2)
Total Protein: 7.9 g/dL (ref 6.5–8.1)

## 2021-09-22 LAB — TOTAL PROTEIN, URINE DIPSTICK: Protein, ur: NEGATIVE mg/dL

## 2021-09-22 LAB — CEA (IN HOUSE-CHCC): CEA (CHCC-In House): 250.82 ng/mL — ABNORMAL HIGH (ref 0.00–5.00)

## 2021-09-22 MED ORDER — SODIUM CHLORIDE 0.9% FLUSH
10.0000 mL | INTRAVENOUS | Status: DC | PRN
  Administered 2021-09-22: 10 mL

## 2021-09-22 MED ORDER — SODIUM CHLORIDE 0.9 % IV SOLN
7.5000 mg/kg | Freq: Once | INTRAVENOUS | Status: AC
  Administered 2021-09-22: 800 mg via INTRAVENOUS
  Filled 2021-09-22: qty 32

## 2021-09-22 MED ORDER — SODIUM CHLORIDE 0.9 % IV SOLN: Freq: Once | INTRAVENOUS | Status: AC

## 2021-09-22 MED ORDER — HEPARIN SOD (PORK) LOCK FLUSH 100 UNIT/ML IV SOLN
500.0000 [IU] | Freq: Once | INTRAVENOUS | Status: AC | PRN
  Administered 2021-09-22: 500 [IU]

## 2021-09-22 NOTE — Progress Notes (Signed)
Casey Black   [phone] [phone]) [phone]   Clinic Follow up Note   Patient Care Team: [name], [name], MD as PCP - General (Internal Medicine) Clovis Riley, MD as Consulting Physician (General Surgery) Truitt Merle, MD as Consulting Physician (Hematology) [name], NP as Nurse Practitioner (Nurse Practitioner)  Date of Service:  09/22/2021  CHIEF COMPLAINT: f/u of metastatic colon cancer  CURRENT THERAPY:  Maintenance Avastin and Xeloda, q3weeks, starting 01/21/20             -Xeloda dose: 2041m q12h on day 1-14  ASSESSMENT & PLAN:  Casey Vawteris a 61y.o. female with   1. 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 -PET from 02/26/19 shows known liver metastasis and metastatic lymphadenopathy in chest and right axilla. -[name] shows MSI stable disease, Kras+ No targetable mutation, she is not a candidate for EGFR or immunotherapy. -Began palliative first line chemo on 03/12/2019, received 5FU/leuc with first 2 cycles for large open abdominal wound after surgery. She started full dose FOLFOX and avastin with cycle 2. Due to neuropathy Oxaliplatin was stopped after 09/24/19. -For convenience she was switched to oral chemo Xeloda 2 weeks on/1 week off and Bev every 3 weeks in 01/21/20. -restaging PET 09/14/21 showed: no significant change to hypermetabolic liver metastases and thoracic lymphadenopathy. I reviewed the results with her today. I feel this is overall stable, so I recommend continuing her current regimen.  Unfortunately due to her multiple liver metastasis and thoracic nodal metastasis, she is not a candidate for local therapy. -I plan to discuss her case with surgeon and rad onc for additional  recommendations. -She is clinically doing well. Labs reviewed, overall stable to improved with extra week off treatment. Urine protein negative. Continue Xeloda and bevacizumab maintenance therapy. She is tolerating very well with only mild skin toxicity.   2. Chemotherapy induced peripheral neuropathy (CIPN), G1 -secondary to Oxaliplatin, developed after cycle 12 FOLFOX. Oxaliplatin d/c after C15 on 09/24/19 -Neuropathy in fingers and feet are moderate and stable with mild decreased function in fingers.  -She is currently on gabapentin 100 mg AM and 300 mg qHS and titrate up if needed.  -overall improved, no impact on her function now. Will continue to monitor.    3. 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 Will give IV iron if needed  -Hgb 12 today (09/22/21)   4. HTN, uncontrolled  -on hydralazine, isosorbide, lisinopril, metoprolol, amlodipine and HCTZ  -Will monitor on bevacizumab.    5. Goals of care discussion, Social support -The patient understands the goal of care is palliative. She is full code. -She worked in [employer] 2 different jobs but has been on disability since her diagnosis.   9. COVID (+) 05/01/20 -She received treatment infusion on 05/06/20. Symptoms overall resolved after treatment.      PLAN: -proceed with bevacizumab today  -continue Xeloda at same dose  -lab, flush, f/u, and beva in 3 weeks   No problem-specific Assessment & Plan notes found for this encounter.   SUMMARY OF ONCOLOGIC HISTORY: Oncology History Overview Note  Cancer Staging Adenocarcinoma of colon metastatic to liver ([facility] Staging form: Colon and Rectum, AJCC 8th Edition -  Pathologic stage from 01/24/2019: Stage IVA (pT4a, pN1a, pM1a) - Signed by [name], NP on 02/14/2019    Adenocarcinoma of colon metastatic to liver ([facility])  01/23/2019 Imaging   ABD Xray IMPRESSION: 1. Bowel-gas  pattern consistent with small bowel obstruction. No free air. 2. No acute chest findings.   01/23/2019 Imaging   CT AP IMPRESSION: Obstructing mid transverse colonic mass with mild regional adenopathy and hepatic metastatic disease. The mass likely extends through the serosa; no ascites or peritoneal nodularity.   01/24/2019 Surgery   Surgeon: Clovis Riley MD Assistant: [name] Procedure performed: Transverse colectomy with end colostomy, liver biopsy Procedure classification: URGENT/EMERGENT Preop diagnosis: Obstructing, metastatic transverse colon mass Post-op diagnosis/intraop findings: Same   01/24/2019 Pathology Results   FINAL MICROSCOPIC DIAGNOSIS:   A. COLON, TRANSVERSE, RESECTION:  Colonic adenocarcinoma, 5 cm.  Carcinoma extends into pericolonic connective tissue and focally to  serosal surface.  Margins not involved.  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   B. LIVER NODULE, LEFT, BIOPSY:  Metastatic adenocarcinoma.    01/24/2019 Cancer Staging   Staging form: Colon and Rectum, AJCC 8th Edition - Pathologic stage from 01/24/2019: Stage IVA (pT4a, pN1a, pM1a) - Signed by [name], NP on 02/14/2019   02/02/2019 Initial Diagnosis   Adenocarcinoma of colon metastatic to liver ([facility])   02/26/2019 PET scan   IMPRESSION: 1. Hypermetabolic metastatic disease in the liver and mediastinal/hilar/axillary lymph nodes. 2. Focal hypermetabolism in the rectum. Continued attention on follow-up exams is warranted. 3. Focal hypermetabolism medial to the right adrenal gland may be within a metastatic lymph node, better visualized on 01/23/2019. 4. Aortic atherosclerosis (ICD10-170.0). Coronary artery calcification.   03/12/2019 -  Chemotherapy   She started 5FU q2weeks on 03/12/19 for 2 cycles. She started full dose FOLFOX with Avastin on 04/09/19. Oxaliplatin dose reduced repeatedly due to neuropathy C12 and held since C16 on 10/09/19.  Now on maintenance Avastin and 5FU q2weeks since 10/09/19       -Maintenance change to maintenance xeloda 2000 mg BID days 1-14 q21 days and q3 weeks Zirabev (15 mg/kg) starting 01/21/20. First cycle was taken 1041m BID due to misunderstanding.    05/31/2019 Imaging   Restaging CT CAP IMPRESSION: 1. Similar to mild interval decrease in size of multiple hepatic lesions, partially calcified. 2. 2 mm right upper lobe pulmonary nodule. Recommend attention on follow-up. 3. Emphysema and aortic atherosclerosis.   08/23/2019 Imaging   CT CAP w contrast  IMPRESSION: 1. The dominant peripheral right liver metastasis has mildly increased. Other smaller liver metastases are stable. 2. Otherwise no new or progressive metastatic disease in the chest, abdomen or pelvis. 3. Aortic Atherosclerosis (ICD10-I70.0) and Emphysema (ICD10-J43.9).   11/27/2019 Imaging   CT CAP w contrast  IMPRESSION: Status post transverse colectomy with right mid abdominal colostomy.   Mildly progressive hepatic metastases, as above.   No evidence of metastatic disease in the chest. Small mediastinal lymph nodes are within normal limits.   Additional stable ancillary findings as above.   02/21/2020 Imaging   IMPRESSION: 1. Stable hepatic metastatic disease. 2. Aortic atherosclerosis (ICD10-I70.0). Coronary artery calcification. 3.  Emphysema (ICD10-J43.9).   05/19/2020 Imaging   CT CAP  IMPRESSION: 1. Multiple partially calcified liver metastases are again noted. With the exception of a small lesion in segment 7/8 lesions are not significantly changed in the interval. No new liver lesions identified. 2. Coronary artery atherosclerotic calcifications. 3. Aortic atherosclerosis. 4. 2 mm right upper  lobe lung nodule identified.  Unchanged.   Aortic Atherosclerosis (ICD10-I70.0).   11/17/2020 Imaging   CT CAP  IMPRESSION: 1. Partially calcified lesions throughout the liver are stable accounting for differences  in technique, contrasted imaging on today's study is compared to noncontrast imaging on the prior. 2. No new hepatic lesions. 3. Tiny 3 mm pulmonary nodule in the RIGHT upper lobe unchanged since the prior study. Attention on follow-up. 4. Mild fullness of RIGHT paratracheal nodal tissue is minimally increased and borderline enlarged, attention on follow-up. 5. RIGHT lower quadrant colostomy. 6. 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 7. Aortic atherosclerosis.   06/09/2021 Imaging   EXAM: CT CHEST, ABDOMEN, AND PELVIS WITH CONTRAST  IMPRESSION: Stable calcified liver metastases.   Stable mildly enlarged right paratracheal lymph node.   No evidence of new or progressive metastatic disease.   Aortic Atherosclerosis (ICD10-I70.0).      INTERVAL HISTORY:  Casey Black is here for a follow up of metastatic colon cancer. She was last seen by NP Mendel Ryder on 09/01/21. She was seen in the infusion area. She reports she is stable overall and enjoyed her vacation. She reports her neuropathy is stable. She notes her fatigue is about the same, and she is able to do everything she needs to.   All other systems were reviewed with the patient and are negative.  MEDICAL HISTORY:  Past Medical History:  Diagnosis Date   Anemia    low iron   Cancer (HCC)    colon cancer   Colon cancer ([facility]) 01/2019   Hypertension 01/23/2019   Personal history of chemotherapy 01/2019   colon CA   SBO (small bowel obstruction) ([facility]) 01/23/2019    SURGICAL HISTORY: Past Surgical History:  Procedure Laterality Date   CESAREAN SECTION     x2   COLOSTOMY N/A 01/24/2019   Procedure: End Loop Colostomy;  Surgeon: Clovis Riley, MD;  Location: [facility];  Service: General;  Laterality: N/A;   PARTIAL COLECTOMY N/A 01/24/2019   Procedure: PARTIAL COLECTOMY;  Surgeon: Clovis Riley,  MD;  Location: [facility];  Service: General;  Laterality: N/A;   PORTACATH PLACEMENT Right 02/28/2019   Procedure: INSERTION PORT-A-CATH WITH ULTRASOUND GUIDANCE;  Surgeon: Clovis Riley, MD;  Location: [facility];  Service: General;  Laterality: Right;    I have reviewed the social history and family history with the patient and they are unchanged from previous note.  ALLERGIES:  has No Known Allergies.  MEDICATIONS:  Current Outpatient Medications  Medication Sig Dispense Refill   acetaminophen (TYLENOL) 325 MG tablet Take 2 tablets (650 mg total) by mouth every 6 (six) hours as needed.     amLODipine (NORVASC) 10 MG tablet TAKE 1 TABLET BY MOUTH EVERY DAY 90 tablet 1   benzonatate (TESSALON) 100 MG capsule Take 1 capsule (100 mg total) by mouth every 8 (eight) hours. 21 capsule 0   capecitabine (XELODA) 500 MG tablet Take 4 tablets (2,000 mg total) by mouth 2 (two) times daily after a meal. For 14 days, then off for 7 days. 112 tablet 2   ferrous sulfate 325 (65 FE) MG tablet TAKE 1 TABLET BY MOUTH 2 TIMES DAILY WITH A MEAL. 180 tablet 1   gabapentin (NEURONTIN) 300 MG capsule TAKE 1 CAPSULE BY MOUTH TWICE A DAY 60 capsule 3   hydrALAZINE (APRESOLINE) 25 MG tablet TAKE 1 TABLET  BY MOUTH EVERY 8 HOURS 30 tablet 0   hydrochlorothiazide (HYDRODIURIL) 12.5 MG tablet Take 1 tablet (12.5 mg total) by mouth daily. 90 tablet 1   hydrocortisone (ANUSOL-HC) 2.5 % rectal cream Place 1 application rectally 2 (two) times daily. 30 g 0   Ibuprofen 200 MG CAPS Take 400 mg by mouth daily as needed (pain).     isosorbide mononitrate (IMDUR) 30 MG 24 hr tablet TAKE 1 TABLET BY MOUTH EVERY DAY 90 tablet 1   lidocaine (LMX) 4 % cream Apply 1 application topically 3 (three) times daily as needed. 30 g 0   lidocaine-prilocaine (EMLA) cream Apply 1 application topically as needed. 30 g 1   lisinopril (ZESTRIL) 40 MG tablet Take 1 tablet (40 mg total) by mouth daily. 90 tablet 1   loperamide (IMODIUM) 2 MG capsule  TAKE 1 CAPSULE (2 MG TOTAL) BY MOUTH AS NEEDED FOR DIARRHEA OR LOOSE STOOLS. 90 capsule 0   metoprolol tartrate (LOPRESSOR) 50 MG tablet TAKE 1 TABLET BY MOUTH TWICE A DAY 180 tablet 1   ondansetron (ZOFRAN ODT) 4 MG disintegrating tablet 78m ODT q4 hours prn nausea/vomit 20 tablet 0   ondansetron (ZOFRAN) 4 MG tablet Take 1 tablet (4 mg total) by mouth every 6 (six) hours as needed for nausea. 30 tablet 0   polycarbophil (FIBERCON) 625 MG tablet Take 1 tablet (625 mg total) by mouth daily. 30 tablet 0   Vitamin D, Ergocalciferol, (DRISDOL) 1.25 MG (50000 UNIT) CAPS capsule Take 1 capsule (50,000 Units total) by mouth every 7 (seven) days for 12 doses. 12 capsule 0   No current facility-administered medications for this visit.   Facility-Administered Medications Ordered in Other Visits  Medication Dose Route Frequency Provider Last Rate Last Admin   sodium chloride flush (NS) 0.9 % injection 10 mL  10 mL Intracatheter PRN FTruitt Merle MD   10 mL at 06/06/20 1051   sodium chloride flush (NS) 0.9 % injection 10 mL  10 mL Intracatheter PRN FTruitt Merle MD   10 mL at 09/22/21 1321    PHYSICAL EXAMINATION: ECOG PERFORMANCE STATUS: 1 - Symptomatic but completely ambulatory  There were no vitals filed for this visit. Wt Readings from Last 3 Encounters:  09/22/21 245 lb 8 oz (111.4 kg)  09/01/21 248 lb 3.2 oz (112.6 kg)  08/03/21 246 lb 14.4 oz (112 kg)     GENERAL:alert, no distress and comfortable SKIN: skin color normal, no rashes or significant lesions EYES: normal, Conjunctiva are pink and non-injected, sclera clear  NEURO: alert & oriented x 3 with fluent speech  LABORATORY DATA:  I have reviewed the data as listed    Latest Ref Rng & Units 09/22/2021   11:15 AM 09/01/2021    1:18 PM 08/03/2021   11:05 AM  CBC  WBC 4.0 - 10.5 K/uL 6.6  7.5  7.4   Hemoglobin 12.0 - 15.0 g/dL 12.0  11.5  11.4   Hematocrit 36.0 - 46.0 % 36.5  34.9  35.2   Platelets 150 - 400 K/uL 178  156  160          Latest Ref Rng & Units 09/22/2021   11:15 AM 09/01/2021    1:18 PM 08/03/2021   11:05 AM  CMP  Glucose 70 - 99 mg/dL 85  96  91   BUN 8 - 23 mg/dL _0 Creatinine 0.44 - 1.00 mg/dL 1.00  1.01  1.09   Sodium 135 - 145  mmol/L 137  137  134   Potassium 3.5 - 5.1 mmol/L 4.2  4.0  4.4   Chloride 98 - 111 mmol/L 110  110  108   CO2 22 - 32 mmol/L _0 Calcium 8.9 - 10.3 mg/dL 9.8  9.8  9.2   Total Protein 6.5 - 8.1 g/dL 7.9  7.4  7.6   Total Bilirubin 0.3 - 1.2 mg/dL 0.5  0.5  0.6   Alkaline Phos 38 - 126 U/L 77  81  86   AST 15 - 41 U/L _1 ALT 0 - 44 U/L _2 RADIOGRAPHIC STUDIES: I have personally reviewed the radiological images as listed and agreed with the findings in the report. No results found.    No orders of the defined types were placed in this encounter.  All questions were answered. The patient knows to call the clinic with any problems, questions or concerns. No barriers to learning was detected. The total time spent in the appointment was 30 minutes.     Truitt Merle, MD 09/22/2021   I, Wilburn Mylar, am acting as scribe for Truitt Merle, MD.   I have reviewed the above documentation for accuracy and completeness, and I agree with the above.

## 2021-09-22 NOTE — Patient Instructions (Signed)
[facility]  Discharge Instructions: °Thank you for choosing [facility] to provide your oncology and hematology care.  ° °If you have a lab appointment with the Cancer Center, please go directly to the Cancer Center and check in at the registration area. °  °Wear comfortable clothing and clothing appropriate for easy access to any Portacath or PICC line.  ° °We strive to give you quality time with your provider. You may need to reschedule your appointment if you arrive late (15 or more minutes).  Arriving late affects you and other patients whose appointments are after yours.  Also, if you miss three or more appointments without notifying the office, you may be dismissed from the clinic at the provider’s discretion.    °  °For prescription refill requests, have your pharmacy contact our office and allow 72 hours for refills to be completed.   ° °Today you received the following chemotherapy and/or immunotherapy agents: Bevacizumab.     °  °To help prevent nausea and vomiting after your treatment, we encourage you to take your nausea medication as directed. ° °BELOW ARE SYMPTOMS THAT SHOULD BE REPORTED IMMEDIATELY: °*FEVER GREATER THAN 100.4 F (38 °C) OR HIGHER °*CHILLS OR SWEATING °*NAUSEA AND VOMITING THAT IS NOT CONTROLLED WITH YOUR NAUSEA MEDICATION °*UNUSUAL SHORTNESS OF BREATH °*UNUSUAL BRUISING OR BLEEDING °*URINARY PROBLEMS (pain or burning when urinating, or frequent urination) °*BOWEL PROBLEMS (unusual diarrhea, constipation, pain near the anus) °TENDERNESS IN MOUTH AND THROAT WITH OR WITHOUT PRESENCE OF ULCERS (sore throat, sores in mouth, or a toothache) °UNUSUAL RASH, SWELLING OR PAIN  °UNUSUAL VAGINAL DISCHARGE OR ITCHING  ° °Items with * indicate a potential emergency and should be followed up as soon as possible or go to the Emergency Department if any problems should occur. ° °Please show the CHEMOTHERAPY ALERT CARD or IMMUNOTHERAPY ALERT CARD at check-in  to the Emergency Department and triage nurse. ° °Should you have questions after your visit or need to cancel or reschedule your appointment, please contact [facility]  Dept: [phone]  and follow the prompts.  Office hours are 8:00 a.m. to 4:30 p.m. Monday - Friday. Please note that voicemails left after 4:00 p.m. may not be returned until the following business day.  We are closed weekends and major holidays. You have access to a nurse at all times for urgent questions. Please call the main number to the clinic Dept: [phone] and follow the prompts. ° ° °For any non-urgent questions, you may also contact your provider using MyChart. We now offer e-Visits for anyone 18 and older to request care online for non-urgent symptoms. For details visit [email or web address]. °  °Also download the MyChart app! Go to the app store, search "MyChart", open the app, select [facility], and log in with your MyChart username and password. ° °Due to Covid, a mask is required upon entering the hospital/clinic. If you do not have a mask, one will be given to you upon arrival. For doctor visits, patients may have 1 support person aged 18 or older with them. For treatment visits, patients cannot have anyone with them due to current Covid guidelines and our immunocompromised population.  ° °

## 2021-09-24 ENCOUNTER — Telehealth: Payer: Self-pay | Admitting: Hematology

## 2021-09-24 NOTE — Telephone Encounter (Signed)
Left message with follow-up appointments per 6/20 los.

## 2021-10-06 ENCOUNTER — Encounter: Payer: Self-pay | Admitting: Hematology

## 2021-10-09 ENCOUNTER — Other Ambulatory Visit (HOSPITAL_COMMUNITY): Payer: Self-pay

## 2021-10-12 ENCOUNTER — Other Ambulatory Visit (HOSPITAL_COMMUNITY): Payer: Self-pay

## 2021-10-12 ENCOUNTER — Encounter: Payer: Self-pay | Admitting: Hematology

## 2021-10-13 ENCOUNTER — Other Ambulatory Visit (HOSPITAL_COMMUNITY): Payer: Self-pay

## 2021-10-13 ENCOUNTER — Other Ambulatory Visit: Payer: Self-pay

## 2021-10-13 ENCOUNTER — Inpatient Hospital Stay: Payer: Medicare Other | Attending: Hematology

## 2021-10-13 ENCOUNTER — Inpatient Hospital Stay: Payer: Medicare Other

## 2021-10-13 ENCOUNTER — Inpatient Hospital Stay (HOSPITAL_BASED_OUTPATIENT_CLINIC_OR_DEPARTMENT_OTHER): Payer: Medicare Other | Admitting: Hematology

## 2021-10-13 ENCOUNTER — Encounter: Payer: Self-pay | Admitting: Hematology

## 2021-10-13 VITALS — BP 159/59 | HR 44 | Temp 97.7°F | Resp 18 | Ht 67.0 in | Wt 246.1 lb

## 2021-10-13 VITALS — BP 144/60 | HR 41

## 2021-10-13 DIAGNOSIS — Z79899 Other long term (current) drug therapy: Secondary | ICD-10-CM | POA: Diagnosis not present

## 2021-10-13 DIAGNOSIS — C189 Malignant neoplasm of colon, unspecified: Secondary | ICD-10-CM

## 2021-10-13 DIAGNOSIS — C787 Secondary malignant neoplasm of liver and intrahepatic bile duct: Secondary | ICD-10-CM | POA: Insufficient documentation

## 2021-10-13 DIAGNOSIS — C771 Secondary and unspecified malignant neoplasm of intrathoracic lymph nodes: Secondary | ICD-10-CM | POA: Diagnosis not present

## 2021-10-13 DIAGNOSIS — C184 Malignant neoplasm of transverse colon: Secondary | ICD-10-CM | POA: Insufficient documentation

## 2021-10-13 DIAGNOSIS — Z5112 Encounter for antineoplastic immunotherapy: Secondary | ICD-10-CM | POA: Insufficient documentation

## 2021-10-13 DIAGNOSIS — Z95828 Presence of other vascular implants and grafts: Secondary | ICD-10-CM

## 2021-10-13 LAB — COMPREHENSIVE METABOLIC PANEL
ALT: 9 U/L (ref 0–44)
AST: 16 U/L (ref 15–41)
Albumin: 3.7 g/dL (ref 3.5–5.0)
Alkaline Phosphatase: 70 U/L (ref 38–126)
Anion gap: 4 — ABNORMAL LOW (ref 5–15)
BUN: 21 mg/dL (ref 8–23)
CO2: 24 mmol/L (ref 22–32)
Calcium: 9.4 mg/dL (ref 8.9–10.3)
Chloride: 108 mmol/L (ref 98–111)
Creatinine, Ser: 0.93 mg/dL (ref 0.44–1.00)
GFR, Estimated: >60 mL/min (ref >60)
Glucose, Bld: 123 mg/dL — ABNORMAL HIGH (ref 70–99)
Potassium: 3.8 mmol/L (ref 3.5–5.1)
Sodium: 136 mmol/L (ref 135–145)
Total Bilirubin: 0.7 mg/dL (ref 0.3–1.2)
Total Protein: 7.6 g/dL (ref 6.5–8.1)

## 2021-10-13 LAB — CEA (IN HOUSE-CHCC): CEA (CHCC-In House): 303.92 ng/mL — ABNORMAL HIGH (ref 0.00–5.00)

## 2021-10-13 LAB — CBC WITH DIFFERENTIAL/PLATELET
Abs Immature Granulocytes: 0.01 10*3/uL (ref 0.00–0.07)
Basophils Absolute: 0 10*3/uL (ref 0.0–0.1)
Basophils Relative: 1 %
Eosinophils Absolute: 0.2 10*3/uL (ref 0.0–0.5)
Eosinophils Relative: 3 %
HCT: 35.4 % — ABNORMAL LOW (ref 36.0–46.0)
Hemoglobin: 11.8 g/dL — ABNORMAL LOW (ref 12.0–15.0)
Immature Granulocytes: 0 %
Lymphocytes Relative: 34 %
Lymphs Abs: 2.1 10*3/uL (ref 0.7–4.0)
MCH: 33.7 pg (ref 26.0–34.0)
MCHC: 33.3 g/dL (ref 30.0–36.0)
MCV: 101.1 fL — ABNORMAL HIGH (ref 80.0–100.0)
Monocytes Absolute: 0.5 10*3/uL (ref 0.1–1.0)
Monocytes Relative: 9 %
Neutro Abs: 3.3 10*3/uL (ref 1.7–7.7)
Neutrophils Relative %: 53 %
Platelets: 157 10*3/uL (ref 150–400)
RBC: 3.5 MIL/uL — ABNORMAL LOW (ref 3.87–5.11)
RDW: 16.8 % — ABNORMAL HIGH (ref 11.5–15.5)
WBC: 6.2 10*3/uL (ref 4.0–10.5)
nRBC: 0 % (ref 0.0–0.2)

## 2021-10-13 LAB — TOTAL PROTEIN, URINE DIPSTICK: Protein, ur: <30 mg/dL — AB

## 2021-10-13 MED ORDER — SODIUM CHLORIDE 0.9% FLUSH
10.0000 mL | INTRAVENOUS | Status: DC | PRN
  Administered 2021-10-13: 10 mL

## 2021-10-13 MED ORDER — HEPARIN SOD (PORK) LOCK FLUSH 100 UNIT/ML IV SOLN
500.0000 [IU] | Freq: Once | INTRAVENOUS | Status: AC | PRN
  Administered 2021-10-13: 500 [IU]

## 2021-10-13 MED ORDER — SODIUM CHLORIDE 0.9 % IV SOLN
7.5000 mg/kg | Freq: Once | INTRAVENOUS | Status: AC
  Administered 2021-10-13: 800 mg via INTRAVENOUS
  Filled 2021-10-13: qty 32

## 2021-10-13 MED ORDER — SODIUM CHLORIDE 0.9 % IV SOLN: Freq: Once | INTRAVENOUS | Status: AC

## 2021-10-13 NOTE — Progress Notes (Signed)
[place]   [phone] [phone]   Clinic Follow up Note   Patient Care Team: [name], [name], MD as PCP - General (Internal Medicine) Clovis Riley, MD as Consulting Physician (General Surgery) Truitt Merle, MD as Consulting Physician (Hematology) [name], NP as Nurse Practitioner (Nurse Practitioner)  Date of Service:  10/13/2021  CHIEF COMPLAINT: f/u of metastatic colon cancer  CURRENT THERAPY:  Maintenance Avastin and Xeloda, q3weeks, starting 01/21/20             -Xeloda dose: 2065m q12h on day 1-14  ASSESSMENT & PLAN:  MSherida Dobkinsis a 61y.o. female with   1. 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 -PET from 02/26/19 shows known liver metastasis and metastatic lymphadenopathy in chest and right axilla. -[name] shows MSI stable disease, Kras+ No targetable mutation, she is not a candidate for EGFR or immunotherapy. -Began palliative first line chemo on 03/12/2019, received 5FU/leuc with first 2 cycles for large open abdominal wound after surgery. She started full dose FOLFOX and avastin with cycle 2. Due to neuropathy Oxaliplatin was stopped after 09/24/19. -For convenience she was switched to oral chemo Xeloda 2 weeks on/1 week off and Bev every 3 weeks in 01/21/20. -restaging PET 09/14/21 showed: no significant change to hypermetabolic liver metastases and thoracic lymphadenopathy. I reviewed the results with her today. I feel this is overall stable, so I recommend continuing her current regimen.  Unfortunately due to her multiple liver metastasis and thoracic nodal metastasis, she is not a candidate for local therapy. -I plan to discuss her case with surgeon and rad onc for additional  recommendations. -She is clinically doing well. Continue Xeloda and bevacizumab maintenance therapy. She is tolerating very well with only mild skin toxicity.   2. Chemotherapy induced peripheral neuropathy (CIPN), G1 -secondary to Oxaliplatin, developed after cycle 12 FOLFOX. Oxaliplatin d/c after C15 on 09/24/19 -Neuropathy in fingers and feet are moderate and stable with mild decreased function in fingers.  -She is currently on gabapentin 100 mg AM and 300 mg qHS and titrate up if needed.  -overall improved, no impact on her function now. Will continue to monitor.    3. 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 Will give IV iron if needed  -Hgb 12 today (09/22/21)   4. HTN, uncontrolled  -on hydralazine, isosorbide, lisinopril, metoprolol, amlodipine and HCTZ  -Will monitor on bevacizumab.    5. Goals of care discussion, Social support -The patient understands the goal of care is palliative. She is full code. -She worked in [employer] 2 different jobs but has been on disability since her diagnosis.   9. COVID (+) 05/01/20 -She received treatment infusion on 05/06/20. Symptoms overall resolved after treatment.      PLAN: -proceed with bevacizumab today  -continue Xeloda at same dose  -lab, flush, f/u, and beva in 3 weeks -she is working on renewing her insurance, hopefully no issues. I will send a message to our care management team    No problem-specific Assessment & Plan notes found for this encounter.   SUMMARY OF ONCOLOGIC HISTORY: Oncology History Overview Note  Cancer Staging Adenocarcinoma of colon metastatic to liver ([facility] Staging form:  Colon and Rectum, AJCC 8th Edition - Pathologic stage from 01/24/2019: Stage IVA (pT4a, pN1a, pM1a) - Signed by [name], NP on 02/14/2019    Adenocarcinoma of colon metastatic to liver ([place])  01/23/2019 Imaging   ABD Xray  IMPRESSION: 1. Bowel-gas pattern consistent with small bowel obstruction. No free air. 2. No acute chest findings.   01/23/2019 Imaging   CT AP IMPRESSION: Obstructing mid transverse colonic mass with mild regional adenopathy and hepatic metastatic disease. The mass likely extends through the serosa; no ascites or peritoneal nodularity.   01/24/2019 Surgery   Surgeon: Clovis Riley MD Assistant: [name] Procedure performed: Transverse colectomy with end colostomy, liver biopsy Procedure classification: URGENT/EMERGENT Preop diagnosis: Obstructing, metastatic transverse colon mass Post-op diagnosis/intraop findings: Same   01/24/2019 Pathology Results   FINAL MICROSCOPIC DIAGNOSIS:   A. COLON, TRANSVERSE, RESECTION:  Colonic adenocarcinoma, 5 cm.  Carcinoma extends into pericolonic connective tissue and focally to  serosal surface.  Margins not involved.  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   B. LIVER NODULE, LEFT, BIOPSY:  Metastatic adenocarcinoma.    01/24/2019 Cancer Staging   Staging form: Colon and Rectum, AJCC 8th Edition - Pathologic stage from 01/24/2019: Stage IVA (pT4a, pN1a, pM1a) - Signed by [name], NP on 02/14/2019   02/02/2019 Initial Diagnosis   Adenocarcinoma of colon metastatic to liver ([place])   02/26/2019 PET scan   IMPRESSION: 1. Hypermetabolic metastatic disease in the liver and mediastinal/hilar/axillary lymph nodes. 2. Focal hypermetabolism in the rectum. Continued attention on follow-up exams is warranted. 3. Focal hypermetabolism medial to the right adrenal gland may be within a metastatic lymph node, better visualized on 01/23/2019. 4. Aortic atherosclerosis (ICD10-170.0). Coronary artery calcification.   03/12/2019 -  Chemotherapy   She started 5FU q2weeks on 03/12/19 for 2 cycles. She started full dose FOLFOX with Avastin on 04/09/19. Oxaliplatin dose reduced repeatedly due to neuropathy C12 and  held since C16 on 10/09/19. Now on maintenance Avastin and 5FU q2weeks since 10/09/19       -Maintenance change to maintenance xeloda 2000 mg BID days 1-14 q21 days and q3 weeks Zirabev (15 mg/kg) starting 01/21/20. First cycle was taken 1059m BID due to misunderstanding.    05/31/2019 Imaging   Restaging CT CAP IMPRESSION: 1. Similar to mild interval decrease in size of multiple hepatic lesions, partially calcified. 2. 2 mm right upper lobe pulmonary nodule. Recommend attention on follow-up. 3. Emphysema and aortic atherosclerosis.   08/23/2019 Imaging   CT CAP w contrast  IMPRESSION: 1. The dominant peripheral right liver metastasis has mildly increased. Other smaller liver metastases are stable. 2. Otherwise no new or progressive metastatic disease in the chest, abdomen or pelvis. 3. Aortic Atherosclerosis (ICD10-I70.0) and Emphysema (ICD10-J43.9).   11/27/2019 Imaging   CT CAP w contrast  IMPRESSION: Status post transverse colectomy with right mid abdominal colostomy.   Mildly progressive hepatic metastases, as above.   No evidence of metastatic disease in the chest. Small mediastinal lymph nodes are within normal limits.   Additional stable ancillary findings as above.   02/21/2020 Imaging   IMPRESSION: 1. Stable hepatic metastatic disease. 2. Aortic atherosclerosis (ICD10-I70.0). Coronary artery calcification. 3.  Emphysema (ICD10-J43.9).   05/19/2020 Imaging   CT CAP  IMPRESSION: 1. Multiple partially calcified liver metastases are again noted. With the exception of a small lesion in segment 7/8 lesions are not significantly changed in the interval. No new liver lesions identified. 2. Coronary artery atherosclerotic calcifications. 3.  Aortic atherosclerosis. 4. 2 mm right upper lobe lung nodule identified.  Unchanged.   Aortic Atherosclerosis (ICD10-I70.0).   11/17/2020 Imaging   CT CAP  IMPRESSION: 1. Partially calcified lesions throughout the liver are  stable accounting for differences in technique, contrasted imaging on today's study is compared to noncontrast imaging on the prior. 2. No new hepatic lesions. 3. Tiny 3 mm pulmonary nodule in the RIGHT upper lobe unchanged since the prior study. Attention on follow-up. 4. Mild fullness of RIGHT paratracheal nodal tissue is minimally increased and borderline enlarged, attention on follow-up. 5. RIGHT lower quadrant colostomy. 6. 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 7. Aortic atherosclerosis.   06/09/2021 Imaging   EXAM: CT CHEST, ABDOMEN, AND PELVIS WITH CONTRAST  IMPRESSION: Stable calcified liver metastases.   Stable mildly enlarged right paratracheal lymph node.   No evidence of new or progressive metastatic disease.   Aortic Atherosclerosis (ICD10-I70.0).      INTERVAL HISTORY:  Casey Black is here for a follow up of metastatic colon cancer. She was last seen by me 3 weeks ago. She is under stress lately because her Medicaid and Medicare will expire and she may not be able to renew. She is worried about the cost of her Xeloda wo insurance. She denies any new pain, except she has mild foot pain. Appetite and energy are normal.    All other systems were reviewed with the patient and are negative.  MEDICAL HISTORY:  Past Medical History:  Diagnosis Date   Anemia    low iron   Cancer (HCC)    colon cancer   Colon cancer ([place]) 01/2019   Hypertension 01/23/2019   Personal history of chemotherapy 01/2019   colon CA   SBO (small bowel obstruction) ([place]) 01/23/2019    SURGICAL HISTORY: Past Surgical History:  Procedure Laterality Date   CESAREAN SECTION     x2   COLOSTOMY N/A 01/24/2019   Procedure: End Loop Colostomy;  Surgeon: Clovis Riley, MD;  Location: [facility];  Service: General;  Laterality: N/A;   PARTIAL COLECTOMY N/A 01/24/2019    Procedure: PARTIAL COLECTOMY;  Surgeon: Clovis Riley, MD;  Location: [facility];  Service: General;  Laterality: N/A;   PORTACATH PLACEMENT Right 02/28/2019   Procedure: INSERTION PORT-A-CATH WITH ULTRASOUND GUIDANCE;  Surgeon: Clovis Riley, MD;  Location: [facility];  Service: General;  Laterality: Right;    I have reviewed the social history and family history with the patient and they are unchanged from previous note.  ALLERGIES:  has No Known Allergies.  MEDICATIONS:  Current Outpatient Medications  Medication Sig Dispense Refill   acetaminophen (TYLENOL) 325 MG tablet Take 2 tablets (650 mg total) by mouth every 6 (six) hours as needed.     amLODipine (NORVASC) 10 MG tablet TAKE 1 TABLET BY MOUTH EVERY DAY 90 tablet 1   benzonatate (TESSALON) 100 MG capsule Take 1 capsule (100 mg total) by mouth every 8 (eight) hours. 21 capsule 0   capecitabine (XELODA) 500 MG tablet Take 4 tablets (2,000 mg total) by mouth 2 (two) times daily after a meal. For 14 days, then off for 7 days. 112 tablet 2   ferrous sulfate 325 (65 FE) MG tablet TAKE 1 TABLET BY MOUTH 2 TIMES DAILY WITH A MEAL. 180 tablet 1   gabapentin (NEURONTIN) 300 MG capsule TAKE 1 CAPSULE BY MOUTH TWICE A  DAY 60 capsule 3   hydrALAZINE (APRESOLINE) 25 MG tablet TAKE 1 TABLET BY MOUTH EVERY 8 HOURS 30 tablet 0   hydrochlorothiazide (HYDRODIURIL) 12.5 MG tablet Take 1 tablet (12.5 mg total) by mouth daily. 90 tablet 1   hydrocortisone (ANUSOL-HC) 2.5 % rectal cream Place 1 application rectally 2 (two) times daily. 30 g 0   Ibuprofen 200 MG CAPS Take 400 mg by mouth daily as needed (pain).     isosorbide mononitrate (IMDUR) 30 MG 24 hr tablet TAKE 1 TABLET BY MOUTH EVERY DAY 90 tablet 1   lidocaine (LMX) 4 % cream Apply 1 application topically 3 (three) times daily as needed. 30 g 0   lidocaine-prilocaine (EMLA) cream Apply 1 application topically as needed. 30 g 1   lisinopril (ZESTRIL) 40 MG tablet Take 1 tablet (40 mg total) by  mouth daily. 90 tablet 1   loperamide (IMODIUM) 2 MG capsule TAKE 1 CAPSULE (2 MG TOTAL) BY MOUTH AS NEEDED FOR DIARRHEA OR LOOSE STOOLS. 90 capsule 0   metoprolol tartrate (LOPRESSOR) 50 MG tablet TAKE 1 TABLET BY MOUTH TWICE A DAY 180 tablet 1   ondansetron (ZOFRAN ODT) 4 MG disintegrating tablet 39m ODT q4 hours prn nausea/vomit 20 tablet 0   ondansetron (ZOFRAN) 4 MG tablet Take 1 tablet (4 mg total) by mouth every 6 (six) hours as needed for nausea. 30 tablet 0   polycarbophil (FIBERCON) 625 MG tablet Take 1 tablet (625 mg total) by mouth daily. 30 tablet 0   Vitamin D, Ergocalciferol, (DRISDOL) 1.25 MG (50000 UNIT) CAPS capsule Take 1 capsule (50,000 Units total) by mouth every 7 (seven) days for 12 doses. 12 capsule 0   No current facility-administered medications for this visit.   Facility-Administered Medications Ordered in Other Visits  Medication Dose Route Frequency Provider Last Rate Last Admin   sodium chloride flush (NS) 0.9 % injection 10 mL  10 mL Intracatheter PRN FTruitt Merle MD   10 mL at 06/06/20 1051    PHYSICAL EXAMINATION: ECOG PERFORMANCE STATUS: 1 - Symptomatic but completely ambulatory  Vitals:   10/13/21 1307  BP: (!) 159/59  Pulse: (!) 44  Resp: 18  Temp: 97.7 F (36.5 C)  SpO2: 99%   Wt Readings from Last 3 Encounters:  10/13/21 246 lb 1.6 oz (111.6 kg)  09/22/21 245 lb 8 oz (111.4 kg)  09/01/21 248 lb 3.2 oz (112.6 kg)     GENERAL:alert, no distress and comfortable SKIN: skin color normal, no rashes or significant lesions EYES: normal, Conjunctiva are pink and non-injected, sclera clear  NEURO: alert & oriented x 3 with fluent speech  LABORATORY DATA:  I have reviewed the data as listed    Latest Ref Rng & Units 10/13/2021   12:41 PM 09/22/2021   11:15 AM 09/01/2021    1:18 PM  CBC  WBC 4.0 - 10.5 K/uL 6.2  6.6  7.5   Hemoglobin 12.0 - 15.0 g/dL 11.8  12.0  11.5   Hematocrit 36.0 - 46.0 % 35.4  36.5  34.9   Platelets 150 - 400 K/uL 157  178   156         Latest Ref Rng & Units 09/22/2021   11:15 AM 09/01/2021    1:18 PM 08/03/2021   11:05 AM  CMP  Glucose 70 - 99 mg/dL 85  96  91   BUN 8 - 23 mg/dL _0 Creatinine 0.44 - 1.00 mg/dL 1.00  1.01  1.09   Sodium 135 - 145 mmol/L 137  137  134   Potassium 3.5 - 5.1 mmol/L 4.2  4.0  4.4   Chloride 98 - 111 mmol/L 110  110  108   CO2 22 - 32 mmol/L _0 Calcium 8.9 - 10.3 mg/dL 9.8  9.8  9.2   Total Protein 6.5 - 8.1 g/dL 7.9  7.4  7.6   Total Bilirubin 0.3 - 1.2 mg/dL 0.5  0.5  0.6   Alkaline Phos 38 - 126 U/L 77  81  86   AST 15 - 41 U/L _1 ALT 0 - 44 U/L _2 RADIOGRAPHIC STUDIES: I have personally reviewed the radiological images as listed and agreed with the findings in the report. No results found.    No orders of the defined types were placed in this encounter.  All questions were answered. The patient knows to call the clinic with any problems, questions or concerns. No barriers to learning was detected. The total time spent in the appointment was 30 minutes.     Truitt Merle, MD 10/13/2021

## 2021-10-14 ENCOUNTER — Other Ambulatory Visit (HOSPITAL_COMMUNITY): Payer: Self-pay

## 2021-10-15 ENCOUNTER — Telehealth: Payer: Self-pay | Admitting: Pharmacy Technician

## 2021-10-15 ENCOUNTER — Encounter: Payer: Self-pay | Admitting: Hematology

## 2021-10-15 ENCOUNTER — Other Ambulatory Visit: Payer: Self-pay

## 2021-10-15 ENCOUNTER — Other Ambulatory Visit (HOSPITAL_COMMUNITY): Payer: Self-pay

## 2021-10-15 ENCOUNTER — Other Ambulatory Visit: Payer: Self-pay | Admitting: Pharmacist

## 2021-10-15 DIAGNOSIS — C189 Malignant neoplasm of colon, unspecified: Secondary | ICD-10-CM

## 2021-10-15 MED ORDER — CAPECITABINE 500 MG PO TABS: 2000.0000 mg | ORAL_TABLET | Freq: Two times a day (BID) | ORAL | 1 refills | Status: DC

## 2021-10-15 MED ORDER — CAPECITABINE 500 MG PO TABS
2000.0000 mg | ORAL_TABLET | Freq: Two times a day (BID) | ORAL | 1 refills | Status: DC
  Filled 2021-10-15 (×2): qty 112, 21d supply, fill #0

## 2021-10-15 NOTE — Progress Notes (Signed)
Per Leron Croak, PharmD for oral chemotherapy please send a refill prescription to [facility] d/t pt was awarded a 1 time grant.  Pt will get all other refills filled by [facility].

## 2021-10-15 NOTE — Telephone Encounter (Signed)
Oral Oncology Patient Advocate Encounter   Was successful in securing patient a conditional grant from [place] to provide copayment coverage for Capcitabine.  This will keep the out of pocket expense at $0 for a one-time fill.   I have spoken with the patient.    The billing information is as follows and has been shared with [place].   Member ID: [id number] Group ID: [id number] RxBin: [id number] PCN: [id number] Dates of Eligibility: 10/15/2021 through 10/16/2022  Fund name: Metastatic Colon Cancer.   Lady Deutscher, CPhT-Adv Pharmacy Patient Advocate Specialist [place] Patient Advocate Team Direct Number: [phone]  Fax: [phone]

## 2021-10-15 NOTE — Progress Notes (Signed)
Oral Oncology Pharmacist Encounter  Due to patient no longer having medicaid coverage, she would like to fill Xeloda (capecitabine) through [facility] at reduced price going forward.   Prescription has been redirected from [facility] to [facility] for patient to set up shipment of medication.   Prescription is required to include patient's e-mail on each Rx sent through this pharmacy so that it can be linked to patient's account. E-mail used is: [email or web address]  Leron Croak, PharmD, BCPS, [facility] Hematology/Oncology Clinical Pharmacist [facility] and [place] [phone] 10/15/2021 11:54 AM

## 2021-10-16 ENCOUNTER — Other Ambulatory Visit (HOSPITAL_COMMUNITY): Payer: Self-pay

## 2021-10-19 ENCOUNTER — Other Ambulatory Visit: Payer: Self-pay | Admitting: Internal Medicine

## 2021-10-19 ENCOUNTER — Other Ambulatory Visit (HOSPITAL_COMMUNITY): Payer: Self-pay

## 2021-10-19 ENCOUNTER — Encounter: Payer: Self-pay | Admitting: Hematology

## 2021-10-19 DIAGNOSIS — Z1231 Encounter for screening mammogram for malignant neoplasm of breast: Secondary | ICD-10-CM

## 2021-10-21 ENCOUNTER — Ambulatory Visit (INDEPENDENT_AMBULATORY_CARE_PROVIDER_SITE_OTHER): Payer: Medicare Other | Admitting: Podiatry

## 2021-10-21 ENCOUNTER — Encounter: Payer: Self-pay | Admitting: Podiatry

## 2021-10-21 DIAGNOSIS — T451X5A Adverse effect of antineoplastic and immunosuppressive drugs, initial encounter: Secondary | ICD-10-CM | POA: Diagnosis not present

## 2021-10-21 DIAGNOSIS — M79675 Pain in left toe(s): Secondary | ICD-10-CM | POA: Diagnosis not present

## 2021-10-21 DIAGNOSIS — G620 Drug-induced polyneuropathy: Secondary | ICD-10-CM | POA: Diagnosis not present

## 2021-10-21 DIAGNOSIS — B351 Tinea unguium: Secondary | ICD-10-CM

## 2021-10-21 DIAGNOSIS — M79674 Pain in right toe(s): Secondary | ICD-10-CM

## 2021-10-21 NOTE — Progress Notes (Signed)
This patient returns to my office for at risk foot care.  This patient requires this care by a professional since this patient will be at risk due to having neuropathy due to cancer treatment.  Patient is presently experiencing peeling in her bottom of both feet.  This patient is unable to cut nails herself since the patient cannot reach her nails.These nails are painful walking and wearing shoes.  This patient presents for at risk foot care today.  General Appearance  Alert, conversant and in no acute stress.  Vascular  Dorsalis pedis and posterior tibial  pulses are weakly  palpable  bilaterally.  Capillary return is within normal limits  bilaterally. Temperature is within normal limits  bilaterally.  Neurologic  Senn-Weinstein monofilament wire test diminished   bilaterally. Muscle power within normal limits bilaterally.  Nails Thick disfigured discolored nails with subungual debris  from hallux to fifth toes bilaterally. No evidence of bacterial infection or drainage bilaterally.  Orthopedic  No limitations of motion  feet .  No crepitus or effusions noted.  No bony pathology or digital deformities noted.  Skin  normotropic skin noted bilaterally.  No signs of infections or ulcers noted.  Asymptomatic porokeratosis sub 4th met left foot.  Desquamation noted plantar aspect feet  B/L.   Onychomycosis  Pain in right toes  Pain in left toes  Consent was obtained for treatment procedures.   Mechanical debridement of nails 1-5  bilaterally performed with a nail nipper.  Filed with dremel without incident.    Return office visit   4 months                   Told patient to return for periodic foot care and evaluation due to potential at risk complications.   Gardiner Barefoot DPM

## 2021-10-26 ENCOUNTER — Other Ambulatory Visit: Payer: Self-pay

## 2021-10-30 ENCOUNTER — Other Ambulatory Visit: Payer: Self-pay | Admitting: Internal Medicine

## 2021-10-30 ENCOUNTER — Ambulatory Visit: Payer: Medicare Other

## 2021-10-30 DIAGNOSIS — Z0000 Encounter for general adult medical examination without abnormal findings: Secondary | ICD-10-CM

## 2021-11-02 ENCOUNTER — Other Ambulatory Visit: Payer: Self-pay | Admitting: Internal Medicine

## 2021-11-03 ENCOUNTER — Observation Stay (HOSPITAL_COMMUNITY)
Admission: EM | Admit: 2021-11-03 | Discharge: 2021-11-04 | Disposition: A | Discharge status: Home / Self Care | Payer: Medicare Other | Attending: Cardiology | Admitting: Cardiology

## 2021-11-03 ENCOUNTER — Other Ambulatory Visit: Payer: Self-pay

## 2021-11-03 ENCOUNTER — Inpatient Hospital Stay: Payer: Medicare Other

## 2021-11-03 ENCOUNTER — Inpatient Hospital Stay: Payer: Medicare Other | Attending: Hematology

## 2021-11-03 ENCOUNTER — Encounter (HOSPITAL_COMMUNITY): Payer: Self-pay | Admitting: Physician Assistant

## 2021-11-03 ENCOUNTER — Inpatient Hospital Stay (HOSPITAL_BASED_OUTPATIENT_CLINIC_OR_DEPARTMENT_OTHER): Payer: Medicare Other | Admitting: Hematology

## 2021-11-03 ENCOUNTER — Encounter: Payer: Self-pay | Admitting: Hematology

## 2021-11-03 ENCOUNTER — Emergency Department (HOSPITAL_COMMUNITY): Payer: Medicare Other

## 2021-11-03 VITALS — BP 117/74 | HR 74 | Temp 98.4°F | Resp 15 | Wt 162.5 lb

## 2021-11-03 VITALS — BP 130/59 | HR 40 | Wt 247.5 lb

## 2021-11-03 DIAGNOSIS — Z79899 Other long term (current) drug therapy: Secondary | ICD-10-CM | POA: Insufficient documentation

## 2021-11-03 DIAGNOSIS — C787 Secondary malignant neoplasm of liver and intrahepatic bile duct: Secondary | ICD-10-CM

## 2021-11-03 DIAGNOSIS — Z85038 Personal history of other malignant neoplasm of large intestine: Secondary | ICD-10-CM | POA: Diagnosis not present

## 2021-11-03 DIAGNOSIS — G620 Drug-induced polyneuropathy: Secondary | ICD-10-CM | POA: Diagnosis not present

## 2021-11-03 DIAGNOSIS — C189 Malignant neoplasm of colon, unspecified: Secondary | ICD-10-CM

## 2021-11-03 DIAGNOSIS — Z5112 Encounter for antineoplastic immunotherapy: Secondary | ICD-10-CM | POA: Insufficient documentation

## 2021-11-03 DIAGNOSIS — C184 Malignant neoplasm of transverse colon: Secondary | ICD-10-CM | POA: Insufficient documentation

## 2021-11-03 DIAGNOSIS — I129 Hypertensive chronic kidney disease with stage 1 through stage 4 chronic kidney disease, or unspecified chronic kidney disease: Secondary | ICD-10-CM | POA: Insufficient documentation

## 2021-11-03 DIAGNOSIS — N182 Chronic kidney disease, stage 2 (mild): Secondary | ICD-10-CM | POA: Insufficient documentation

## 2021-11-03 DIAGNOSIS — I471 Supraventricular tachycardia: Secondary | ICD-10-CM | POA: Insufficient documentation

## 2021-11-03 DIAGNOSIS — Z9049 Acquired absence of other specified parts of digestive tract: Secondary | ICD-10-CM | POA: Diagnosis not present

## 2021-11-03 DIAGNOSIS — Z95828 Presence of other vascular implants and grafts: Secondary | ICD-10-CM

## 2021-11-03 DIAGNOSIS — D509 Iron deficiency anemia, unspecified: Secondary | ICD-10-CM | POA: Diagnosis not present

## 2021-11-03 DIAGNOSIS — Z87891 Personal history of nicotine dependence: Secondary | ICD-10-CM | POA: Insufficient documentation

## 2021-11-03 DIAGNOSIS — R001 Bradycardia, unspecified: Secondary | ICD-10-CM

## 2021-11-03 DIAGNOSIS — I442 Atrioventricular block, complete: Principal | ICD-10-CM | POA: Diagnosis present

## 2021-11-03 LAB — CBC WITH DIFFERENTIAL/PLATELET
Abs Immature Granulocytes: 0.01 10*3/uL (ref 0.00–0.07)
Abs Immature Granulocytes: 0.02 10*3/uL (ref 0.00–0.07)
Basophils Absolute: 0 10*3/uL (ref 0.0–0.1)
Basophils Absolute: 0.1 10*3/uL (ref 0.0–0.1)
Basophils Relative: 0 %
Basophils Relative: 1 %
Eosinophils Absolute: 0.2 10*3/uL (ref 0.0–0.5)
Eosinophils Absolute: 0.2 10*3/uL (ref 0.0–0.5)
Eosinophils Relative: 3 %
Eosinophils Relative: 3 %
HCT: 35.1 % — ABNORMAL LOW (ref 36.0–46.0)
HCT: 39 % (ref 36.0–46.0)
Hemoglobin: 11.7 g/dL — ABNORMAL LOW (ref 12.0–15.0)
Hemoglobin: 12.5 g/dL (ref 12.0–15.0)
Immature Granulocytes: 0 %
Immature Granulocytes: 0 %
Lymphocytes Relative: 36 %
Lymphocytes Relative: 36 %
Lymphs Abs: 2.5 10*3/uL (ref 0.7–4.0)
Lymphs Abs: 2.7 10*3/uL (ref 0.7–4.0)
MCH: 34 pg (ref 26.0–34.0)
MCH: 33.7 pg (ref 26.0–34.0)
MCHC: 33.3 g/dL (ref 30.0–36.0)
MCHC: 32.1 g/dL (ref 30.0–36.0)
MCV: 102 fL — ABNORMAL HIGH (ref 80.0–100.0)
MCV: 105.1 fL — ABNORMAL HIGH (ref 80.0–100.0)
Monocytes Absolute: 0.6 10*3/uL (ref 0.1–1.0)
Monocytes Absolute: 0.7 10*3/uL (ref 0.1–1.0)
Monocytes Relative: 9 %
Monocytes Relative: 9 %
Neutro Abs: 3.6 10*3/uL (ref 1.7–7.7)
Neutro Abs: 3.8 10*3/uL (ref 1.7–7.7)
Neutrophils Relative %: 52 %
Neutrophils Relative %: 51 %
Platelets: 134 10*3/uL — ABNORMAL LOW (ref 150–400)
Platelets: 144 10*3/uL — ABNORMAL LOW (ref 150–400)
RBC: 3.44 MIL/uL — ABNORMAL LOW (ref 3.87–5.11)
RBC: 3.71 MIL/uL — ABNORMAL LOW (ref 3.87–5.11)
RDW: 17.2 % — ABNORMAL HIGH (ref 11.5–15.5)
RDW: 17.2 % — ABNORMAL HIGH (ref 11.5–15.5)
WBC: 7.1 10*3/uL (ref 4.0–10.5)
WBC: 7.4 10*3/uL (ref 4.0–10.5)
nRBC: 0.3 % — ABNORMAL HIGH (ref 0.0–0.2)
nRBC: 0 % (ref 0.0–0.2)

## 2021-11-03 LAB — COMPREHENSIVE METABOLIC PANEL
ALT: 7 U/L (ref 0–44)
AST: 15 U/L (ref 15–41)
Albumin: 3.7 g/dL (ref 3.5–5.0)
Alkaline Phosphatase: 71 U/L (ref 38–126)
Anion gap: 3 — ABNORMAL LOW (ref 5–15)
BUN: 26 mg/dL — ABNORMAL HIGH (ref 8–23)
CO2: 23 mmol/L (ref 22–32)
Calcium: 9.2 mg/dL (ref 8.9–10.3)
Chloride: 109 mmol/L (ref 98–111)
Creatinine, Ser: 1.02 mg/dL — ABNORMAL HIGH (ref 0.44–1.00)
GFR, Estimated: >60 mL/min (ref >60)
Glucose, Bld: 88 mg/dL (ref 70–99)
Potassium: 4.5 mmol/L (ref 3.5–5.1)
Sodium: 135 mmol/L (ref 135–145)
Total Bilirubin: 0.4 mg/dL (ref 0.3–1.2)
Total Protein: 7.4 g/dL (ref 6.5–8.1)

## 2021-11-03 LAB — BASIC METABOLIC PANEL
Anion gap: 4 — ABNORMAL LOW (ref 5–15)
BUN: 22 mg/dL (ref 8–23)
CO2: 20 mmol/L — ABNORMAL LOW (ref 22–32)
Calcium: 9.5 mg/dL (ref 8.9–10.3)
Chloride: 111 mmol/L (ref 98–111)
Creatinine, Ser: 1.15 mg/dL — ABNORMAL HIGH (ref 0.44–1.00)
GFR, Estimated: 54 mL/min — ABNORMAL LOW (ref >60)
Glucose, Bld: 83 mg/dL (ref 70–99)
Potassium: 4.6 mmol/L (ref 3.5–5.1)
Sodium: 135 mmol/L (ref 135–145)

## 2021-11-03 LAB — CEA (IN HOUSE-CHCC): CEA (CHCC-In House): 261.37 ng/mL — ABNORMAL HIGH (ref 0.00–5.00)

## 2021-11-03 LAB — TOTAL PROTEIN, URINE DIPSTICK: Protein, ur: NEGATIVE mg/dL

## 2021-11-03 LAB — TROPONIN I (HIGH SENSITIVITY): Troponin I (High Sensitivity): 7 ng/L (ref <18)

## 2021-11-03 MED ORDER — HEPARIN SOD (PORK) LOCK FLUSH 100 UNIT/ML IV SOLN
500.0000 [IU] | Freq: Once | INTRAVENOUS | Status: AC | PRN
  Administered 2021-11-03: 500 [IU]

## 2021-11-03 MED ORDER — ZOLPIDEM TARTRATE 5 MG PO TABS: 5.0000 mg | ORAL_TABLET | Freq: Every evening | ORAL | Status: DC | PRN

## 2021-11-03 MED ORDER — SODIUM CHLORIDE 0.9% FLUSH: 10.0000 mL | INTRAVENOUS | Status: DC | PRN

## 2021-11-03 MED ORDER — ENOXAPARIN SODIUM 40 MG/0.4ML IJ SOSY
40.0000 mg | PREFILLED_SYRINGE | INTRAMUSCULAR | Status: DC
  Administered 2021-11-03: 40 mg via SUBCUTANEOUS
  Filled 2021-11-03: qty 0.4

## 2021-11-03 MED ORDER — SODIUM CHLORIDE 0.9% FLUSH: 3.0000 mL | INTRAVENOUS | Status: DC | PRN

## 2021-11-03 MED ORDER — ACETAMINOPHEN 325 MG PO TABS: 650.0000 mg | ORAL_TABLET | ORAL | Status: DC | PRN

## 2021-11-03 MED ORDER — NITROGLYCERIN 0.4 MG SL SUBL: 0.4000 mg | SUBLINGUAL_TABLET | SUBLINGUAL | Status: DC | PRN

## 2021-11-03 MED ORDER — AMLODIPINE BESYLATE 10 MG PO TABS
10.0000 mg | ORAL_TABLET | Freq: Every day | ORAL | Status: DC
  Administered 2021-11-04: 10 mg via ORAL
  Filled 2021-11-03: qty 1

## 2021-11-03 MED ORDER — GABAPENTIN 300 MG PO CAPS
300.0000 mg | ORAL_CAPSULE | Freq: Two times a day (BID) | ORAL | Status: DC
  Administered 2021-11-03 – 2021-11-04 (×2): 300 mg via ORAL
  Filled 2021-11-03 (×2): qty 1

## 2021-11-03 MED ORDER — CAPECITABINE 500 MG PO TABS: 2000.0000 mg | ORAL_TABLET | Freq: Two times a day (BID) | ORAL | Status: DC

## 2021-11-03 MED ORDER — SODIUM CHLORIDE 0.9 % IV SOLN
7.5000 mg/kg | Freq: Once | INTRAVENOUS | Status: AC
  Administered 2021-11-03: 800 mg via INTRAVENOUS
  Filled 2021-11-03: qty 32

## 2021-11-03 MED ORDER — SODIUM CHLORIDE 0.9% FLUSH
10.0000 mL | INTRAVENOUS | Status: DC | PRN
  Administered 2021-11-03: 10 mL

## 2021-11-03 MED ORDER — BENZONATATE 100 MG PO CAPS
100.0000 mg | ORAL_CAPSULE | Freq: Three times a day (TID) | ORAL | Status: DC
  Administered 2021-11-04 (×2): 100 mg via ORAL
  Filled 2021-11-03 (×2): qty 1

## 2021-11-03 MED ORDER — ALPRAZOLAM 0.25 MG PO TABS: 0.2500 mg | ORAL_TABLET | Freq: Two times a day (BID) | ORAL | Status: DC | PRN

## 2021-11-03 MED ORDER — SODIUM CHLORIDE 0.9 % IV SOLN: Freq: Once | INTRAVENOUS | Status: AC

## 2021-11-03 MED ORDER — FERROUS SULFATE 325 (65 FE) MG PO TABS
325.0000 mg | ORAL_TABLET | Freq: Two times a day (BID) | ORAL | Status: DC
  Administered 2021-11-04: 325 mg via ORAL
  Filled 2021-11-03: qty 1

## 2021-11-03 MED ORDER — ONDANSETRON HCL 4 MG/2ML IJ SOLN: 4.0000 mg | Freq: Four times a day (QID) | INTRAMUSCULAR | Status: DC | PRN

## 2021-11-03 MED ORDER — ISOSORBIDE MONONITRATE ER 30 MG PO TB24: 30.0000 mg | ORAL_TABLET | Freq: Every day | ORAL | Status: DC

## 2021-11-03 MED ORDER — SODIUM CHLORIDE 0.9% FLUSH
3.0000 mL | Freq: Two times a day (BID) | INTRAVENOUS | Status: DC
  Administered 2021-11-03 – 2021-11-04 (×2): 3 mL via INTRAVENOUS

## 2021-11-03 MED ORDER — CALCIUM POLYCARBOPHIL 625 MG PO TABS
625.0000 mg | ORAL_TABLET | Freq: Every day | ORAL | Status: DC
  Administered 2021-11-04: 625 mg via ORAL
  Filled 2021-11-03: qty 1

## 2021-11-03 MED ORDER — HYDROCORTISONE (PERIANAL) 2.5 % EX CREA
1.0000 | TOPICAL_CREAM | Freq: Two times a day (BID) | CUTANEOUS | Status: DC
  Filled 2021-11-03: qty 28.35

## 2021-11-03 MED ORDER — AMLODIPINE BESYLATE 5 MG PO TABS: 10.0000 mg | ORAL_TABLET | Freq: Every day | ORAL | Status: DC

## 2021-11-03 MED ORDER — HYDRALAZINE HCL 25 MG PO TABS
25.0000 mg | ORAL_TABLET | Freq: Three times a day (TID) | ORAL | Status: DC
  Administered 2021-11-03 – 2021-11-04 (×2): 25 mg via ORAL
  Filled 2021-11-03 (×2): qty 1

## 2021-11-03 MED ORDER — SODIUM CHLORIDE 0.9 % IV SOLN: 250.0000 mL | INTRAVENOUS | Status: DC | PRN

## 2021-11-03 MED ORDER — ISOSORBIDE MONONITRATE ER 30 MG PO TB24
30.0000 mg | ORAL_TABLET | Freq: Every day | ORAL | Status: DC
  Administered 2021-11-04: 30 mg via ORAL
  Filled 2021-11-03: qty 1

## 2021-11-03 NOTE — Progress Notes (Signed)
[place]   [phone] Fax:([phone]   Clinic Follow up Note   Patient Care Team: [name], [name], MD as PCP - General (Internal Medicine) Clovis Riley, MD as Consulting Physician (General Surgery) Truitt Merle, MD as Consulting Physician (Hematology) [name], NP as Nurse Practitioner (Nurse Practitioner)  Date of Service:  11/03/2021  CHIEF COMPLAINT: f/u of metastatic colon cancer  CURRENT THERAPY:  Maintenance Avastin and Xeloda, q3weeks, starting 01/21/20             -Xeloda dose: 2056m q12h on day 1-14  ASSESSMENT & PLAN:  MSidnie Swalleyis a 61y.o. female with   1. 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 -PET from 02/26/19 shows known liver metastasis and metastatic lymphadenopathy in chest and right axilla. -[name] shows MSI stable disease, Kras+ No targetable mutation, she is not a candidate for EGFR or immunotherapy. -Began palliative first line chemo on 03/12/2019, received 5FU/leuc with first 2 cycles for large open abdominal wound after surgery. She started full dose FOLFOX and avastin with cycle 2. Due to neuropathy Oxaliplatin was stopped after 09/24/19. -For convenience she was switched to oral chemo Xeloda 2 weeks on/1 week off and Bev every 3 weeks in 01/21/20. -restaging PET 09/14/21 showed: no significant change to hypermetabolic liver metastases and thoracic lymphadenopathy. I feel this is overall stable, will continue her current regimen. -She is clinically doing well and reports slightly worse fatigue in past 2-3 weeks. Labs reviewed, overall stable. Adequate to continue Xeloda and bevacizumab maintenance therapy.    2. Chemotherapy induced peripheral neuropathy (CIPN),  G1 -secondary to Oxaliplatin, developed after cycle 12 FOLFOX. Oxaliplatin d/c after C15 on 09/24/19 -Neuropathy in fingers and feet are moderate and stable with mild decreased function in fingers.  -She is currently on gabapentin 100 mg AM and 300 mg qHS and titrate up if needed.  -overall improved, no impact on her function now. Will continue to monitor.    3. 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 Will give IV iron if needed  -Hgb 11.7 today (11/03/21)   4. HTN, uncontrolled  -on hydralazine, isosorbide, lisinopril, metoprolol, amlodipine and HCTZ  -Will monitor on bevacizumab.  -her HR has been low lately (between 40-55). We will do EKG today.   5. Goals of care discussion, Social support -The patient understands the goal of care is palliative. She is full code. -She worked in [employer] 2 different jobs but has been on disability since her diagnosis.   6.  Bradycardia and high grade AV block -She has been bradycardic lately, heart rate of 41 today, I did EKG which showed junctional rhythm, heart rate 46. -I called on-call cardiologist Dr. HPercival Spanishand he feels this is third degree AV block, and recommended ED visit and admission for observation    PLAN: -proceed with bevacizumab today  -continue Xeloda at same dose  -lab, flush, f/u, and beva in 3 weeks  -will order restaging scan at next visit -Per Dr. HRosezella Floridarecommendation, [facility] for [facility] and cardiac evaluation    No problem-specific Assessment & Plan notes found for this encounter.   SUMMARY OF  ONCOLOGIC HISTORY: Oncology History Overview Note  Cancer Staging Adenocarcinoma of colon metastatic to liver [facility]) Staging form: Colon and Rectum, AJCC 8th Edition - Pathologic stage from 01/24/2019: Stage IVA (pT4a, pN1a, pM1a) - Signed by [name], NP on 02/14/2019     Adenocarcinoma of colon metastatic to liver ([place])  01/23/2019 Imaging   ABD Xray IMPRESSION: 1. Bowel-gas pattern consistent with small bowel obstruction. No free air. 2. No acute chest findings.   01/23/2019 Imaging   CT AP IMPRESSION: Obstructing mid transverse colonic mass with mild regional adenopathy and hepatic metastatic disease. The mass likely extends through the serosa; no ascites or peritoneal nodularity.   01/24/2019 Surgery   Surgeon: Clovis Riley MD Assistant: [name] Procedure performed: Transverse colectomy with end colostomy, liver biopsy Procedure classification: URGENT/EMERGENT Preop diagnosis: Obstructing, metastatic transverse colon mass Post-op diagnosis/intraop findings: Same   01/24/2019 Pathology Results   FINAL MICROSCOPIC DIAGNOSIS:   A. COLON, TRANSVERSE, RESECTION:  Colonic adenocarcinoma, 5 cm.  Carcinoma extends into pericolonic connective tissue and focally to  serosal surface.  Margins not involved.  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   B. LIVER NODULE, LEFT, BIOPSY:  Metastatic adenocarcinoma.    01/24/2019 Cancer Staging   Staging form: Colon and Rectum, AJCC 8th Edition - Pathologic stage from 01/24/2019: Stage IVA (pT4a, pN1a, pM1a) - Signed by [name], NP on 02/14/2019   02/02/2019 Initial Diagnosis   Adenocarcinoma of colon metastatic to liver ([place])   02/26/2019 PET scan   IMPRESSION: 1. Hypermetabolic metastatic disease in the liver and mediastinal/hilar/axillary lymph nodes. 2. Focal hypermetabolism in the rectum. Continued attention on follow-up exams is warranted. 3. Focal hypermetabolism medial to the right adrenal gland may be within a metastatic lymph node, better visualized on 01/23/2019. 4. Aortic atherosclerosis (ICD10-170.0). Coronary artery calcification.   03/12/2019 -  Chemotherapy   She started 5FU q2weeks on 03/12/19 for 2 cycles. She started full dose FOLFOX  with Avastin on 04/09/19. Oxaliplatin dose reduced repeatedly due to neuropathy C12 and held since C16 on 10/09/19. Now on maintenance Avastin and 5FU q2weeks since 10/09/19       -Maintenance change to maintenance xeloda 2000 mg BID days 1-14 q21 days and q3 weeks Zirabev (15 mg/kg) starting 01/21/20. First cycle was taken 1048m BID due to misunderstanding.    05/31/2019 Imaging   Restaging CT CAP IMPRESSION: 1. Similar to mild interval decrease in size of multiple hepatic lesions, partially calcified. 2. 2 mm right upper lobe pulmonary nodule. Recommend attention on follow-up. 3. Emphysema and aortic atherosclerosis.   08/23/2019 Imaging   CT CAP w contrast  IMPRESSION: 1. The dominant peripheral right liver metastasis has mildly increased. Other smaller liver metastases are stable. 2. Otherwise no new or progressive metastatic disease in the chest, abdomen or pelvis. 3. Aortic Atherosclerosis (ICD10-I70.0) and Emphysema (ICD10-J43.9).   11/27/2019 Imaging   CT CAP w contrast  IMPRESSION: Status post transverse colectomy with right mid abdominal colostomy.   Mildly progressive hepatic metastases, as above.   No evidence of metastatic disease in the chest. Small mediastinal lymph nodes are within normal limits.   Additional stable ancillary findings as above.   02/21/2020 Imaging   IMPRESSION: 1. Stable hepatic metastatic disease. 2. Aortic atherosclerosis (ICD10-I70.0). Coronary artery calcification. 3.  Emphysema (ICD10-J43.9).   05/19/2020 Imaging   CT CAP  IMPRESSION: 1. Multiple partially calcified liver metastases are again noted. With the exception of a small lesion in segment 7/8 lesions  are not significantly changed in the interval. No new liver lesions identified. 2. Coronary artery atherosclerotic calcifications. 3. Aortic atherosclerosis. 4. 2 mm right upper lobe lung nodule identified.  Unchanged.   Aortic Atherosclerosis (ICD10-I70.0).   11/17/2020 Imaging   CT  CAP  IMPRESSION: 1. Partially calcified lesions throughout the liver are stable accounting for differences in technique, contrasted imaging on today's study is compared to noncontrast imaging on the prior. 2. No new hepatic lesions. 3. Tiny 3 mm pulmonary nodule in the RIGHT upper lobe unchanged since the prior study. Attention on follow-up. 4. Mild fullness of RIGHT paratracheal nodal tissue is minimally increased and borderline enlarged, attention on follow-up. 5. RIGHT lower quadrant colostomy. 6. 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 7. Aortic atherosclerosis.   06/09/2021 Imaging   EXAM: CT CHEST, ABDOMEN, AND PELVIS WITH CONTRAST  IMPRESSION: Stable calcified liver metastases.   Stable mildly enlarged right paratracheal lymph node.   No evidence of new or progressive metastatic disease.   Aortic Atherosclerosis (ICD10-I70.0).      INTERVAL HISTORY:  Casey Black is here for a follow up of metastatic colon cancer. She was last seen by me on 10/13/21. She presents to the clinic alone. She reports she is doing well overall. Her HR is low, but she reports her only complaint is fatigue.   All other systems were reviewed with the patient and are negative.  MEDICAL HISTORY:  Past Medical History:  Diagnosis Date   Anemia    low iron   Cancer (HCC)    colon cancer   Colon cancer ([place]) 01/2019   Hypertension 01/23/2019   Personal history of chemotherapy 01/2019   colon CA   SBO (small bowel obstruction) ([place]) 01/23/2019    SURGICAL HISTORY: Past Surgical History:  Procedure Laterality Date   CESAREAN SECTION     x2   COLOSTOMY N/A 01/24/2019   Procedure: End Loop Colostomy;  Surgeon: Clovis Riley, MD;  Location: [place] [facility] OR;  Service: General;  Laterality: N/A;   PARTIAL COLECTOMY N/A 01/24/2019   Procedure: PARTIAL COLECTOMY;  Surgeon:  Clovis Riley, MD;  Location: [place] OR;  Service: General;  Laterality: N/A;   PORTACATH PLACEMENT Right 02/28/2019   Procedure: INSERTION PORT-A-CATH WITH ULTRASOUND GUIDANCE;  Surgeon: Clovis Riley, MD;  Location: [facility];  Service: General;  Laterality: Right;    I have reviewed the social history and family history with the patient and they are unchanged from previous note.  ALLERGIES:  has No Known Allergies.  MEDICATIONS:  No current facility-administered medications for this visit.   Current Outpatient Medications  Medication Sig Dispense Refill   acetaminophen (TYLENOL) 325 MG tablet Take 2 tablets (650 mg total) by mouth every 6 (six) hours as needed.     amLODipine (NORVASC) 10 MG tablet TAKE 1 TABLET BY MOUTH EVERY DAY 90 tablet 1   benzonatate (TESSALON) 100 MG capsule Take 1 capsule (100 mg total) by mouth every 8 (eight) hours. 21 capsule 0   capecitabine (XELODA) 500 MG tablet Take 4 tablets (2,000 mg total) by mouth 2 (two) times daily after a meal. Take for 14 days, then off for 7 days. 112 tablet 1   ferrous sulfate 325 (65 FE) MG tablet TAKE 1 TABLET BY MOUTH 2 TIMES DAILY WITH A MEAL. 180 tablet 1   gabapentin (NEURONTIN) 300 MG capsule TAKE 1 CAPSULE  BY MOUTH TWICE A DAY 60 capsule 3   hydrALAZINE (APRESOLINE) 25 MG tablet TAKE 1 TABLET BY MOUTH EVERY 8 HOURS 30 tablet 0   hydrochlorothiazide (HYDRODIURIL) 12.5 MG tablet TAKE 1 TABLET BY MOUTH EVERY DAY 90 tablet 0   hydrocortisone (ANUSOL-HC) 2.5 % rectal cream Place 1 application rectally 2 (two) times daily. 30 g 0   Ibuprofen 200 MG CAPS Take 400 mg by mouth daily as needed (pain).     isosorbide mononitrate (IMDUR) 30 MG 24 hr tablet TAKE 1 TABLET BY MOUTH EVERY DAY 90 tablet 1   lidocaine (LMX) 4 % cream Apply 1 application topically 3 (three) times daily as needed. 30 g 0   lidocaine-prilocaine (EMLA) cream Apply 1 application topically as needed. 30 g 1   lisinopril (ZESTRIL) 40 MG tablet Take 1 tablet (40  mg total) by mouth daily. 90 tablet 1   loperamide (IMODIUM) 2 MG capsule TAKE 1 CAPSULE (2 MG TOTAL) BY MOUTH AS NEEDED FOR DIARRHEA OR LOOSE STOOLS. 90 capsule 0   metoprolol tartrate (LOPRESSOR) 50 MG tablet TAKE 1 TABLET BY MOUTH TWICE A DAY 180 tablet 1   ondansetron (ZOFRAN ODT) 4 MG disintegrating tablet 38m ODT q4 hours prn nausea/vomit 20 tablet 0   ondansetron (ZOFRAN) 4 MG tablet Take 1 tablet (4 mg total) by mouth every 6 (six) hours as needed for nausea. 30 tablet 0   polycarbophil (FIBERCON) 625 MG tablet Take 1 tablet (625 mg total) by mouth daily. 30 tablet 0   Facility-Administered Medications Ordered in Other Visits  Medication Dose Route Frequency Provider Last Rate Last Admin   sodium chloride flush (NS) 0.9 % injection 10 mL  10 mL Intracatheter PRN FTruitt Merle MD   10 mL at 06/06/20 1051    PHYSICAL EXAMINATION: ECOG PERFORMANCE STATUS: 1 - Symptomatic but completely ambulatory  Vitals:   11/03/21 1219  BP: 117/74  Pulse: 74  Resp: 15  Temp: 98.4 F (36.9 C)  SpO2: 100%   Wt Readings from Last 3 Encounters:  11/03/21 247 lb 8 oz (112.3 kg)  11/03/21 162 lb 8 oz (73.7 kg)  10/13/21 246 lb 1.6 oz (111.6 kg)     GENERAL:alert, no distress and comfortable SKIN: skin color normal, no rashes or significant lesions EYES: normal, Conjunctiva are pink and non-injected, sclera clear  NEURO: alert & oriented x 3 with fluent speech  LABORATORY DATA:  I have reviewed the data as listed    Latest Ref Rng & Units 11/03/2021   11:59 AM 10/13/2021   12:41 PM 09/22/2021   11:15 AM  CBC  WBC 4.0 - 10.5 K/uL 7.1  6.2  6.6   Hemoglobin 12.0 - 15.0 g/dL 11.7  11.8  12.0   Hematocrit 36.0 - 46.0 % 35.1  35.4  36.5   Platelets 150 - 400 K/uL 134  157  178         Latest Ref Rng & Units 11/03/2021   11:59 AM 10/13/2021   12:41 PM 09/22/2021   11:15 AM  CMP  Glucose 70 - 99 mg/dL 88  123  85   BUN 8 - 23 mg/dL 26  21  23    Creatinine 0.44 - 1.00 mg/dL 1.02  0.93  1.00    Sodium 135 - 145 mmol/L 135  136  137   Potassium 3.5 - 5.1 mmol/L 4.5  3.8  4.2   Chloride 98 - 111 mmol/L 109  108  110   CO2 22 -  32 mmol/L 23  24  24    Calcium 8.9 - 10.3 mg/dL 9.2  9.4  9.8   Total Protein 6.5 - 8.1 g/dL 7.4  7.6  7.9   Total Bilirubin 0.3 - 1.2 mg/dL 0.4  0.7  0.5   Alkaline Phos 38 - 126 U/L 71  70  77   AST 15 - 41 U/L 15  16  17    ALT 0 - 44 U/L 7  9  8        RADIOGRAPHIC STUDIES: I have personally reviewed the radiological images as listed and agreed with the findings in the report. No results found.    No orders of the defined types were placed in this encounter.  All questions were answered. The patient knows to call the clinic with any problems, questions or concerns. No barriers to learning was detected. The total time spent in the appointment was 30 minutes.     Truitt Merle, MD 11/03/2021   I, Wilburn Mylar, am acting as scribe for Truitt Merle, MD.   I have reviewed the above documentation for accuracy and completeness, and I agree with the above.

## 2021-11-03 NOTE — H&P (Addendum)
Cardiology Admission History and Physical:   Patient ID: Casey Black MRN: [id number]; DOB: [birth year]   Admission date: 11/03/2021  PCP:  [name], [name], MD   [facility] Providers Cardiologist:  None        Chief Complaint:  bradycardia  Patient Profile:   Casey Black is a 61 y.o. female with colon CA, periph neuropathy 2nd chemo, IDA 2nd CA, HTN, SBO, who is being seen 11/03/2021 for the evaluation of bradycardia.  History of Present Illness:   Casey Black was seen by Dr Burr Medico today for CA f/u. In the office, she was bradycardic with junctional rhythm. Pt sent to the ER and Cards asked to admit.   Of note, she is on metoprolol 50 mg bid at home. Of note, her HR has been in the low 40s at times (41 on 06/06/2020) with no intervention.   Casey Black has been compliant w/ metop 50 mg bid. The people at the [facility] have mentioned her HR to her in the past.   She has not been aware of any sx related to the bradycardia until about a week ago.   Since then she has been weak. A little dizzy, tired. No presyncope or syncope. No palpitations.   No chest pain or SOB.    Past Medical History:  Diagnosis Date   Anemia    low iron   Colon cancer ([place]) 01/2019   Hypertension 01/23/2019   Personal history of chemotherapy 01/2019   colon CA   SBO (small bowel obstruction) ([place]) 01/23/2019    Past Surgical History:  Procedure Laterality Date   CESAREAN SECTION     x2   COLOSTOMY N/A 01/24/2019   Procedure: End Loop Colostomy;  Surgeon: Clovis Riley, MD;  Location: [place] [facility];  Service: General;  Laterality: N/A;   PARTIAL COLECTOMY N/A 01/24/2019   Procedure: PARTIAL COLECTOMY;  Surgeon: Clovis Riley, MD;  Location: [place] [facility];  Service: General;  Laterality: N/A;   PORTACATH PLACEMENT Right 02/28/2019   Procedure: INSERTION PORT-A-CATH WITH ULTRASOUND GUIDANCE;  Surgeon: Clovis Riley, MD;  Location: [place];  Service: General;  Laterality: Right;      Medications Prior to Admission: Prior to Admission medications   Medication Sig Start Date End Date Taking? Authorizing Provider  acetaminophen (TYLENOL) 325 MG tablet Take 2 tablets (650 mg total) by mouth every 6 (six) hours as needed. 02/03/19   Debbe Odea, MD  amLODipine (NORVASC) 10 MG tablet TAKE 1 TABLET BY MOUTH EVERY DAY 09/11/21   [name], [name], MD  benzonatate (TESSALON) 100 MG capsule Take 1 capsule (100 mg total) by mouth every 8 (eight) hours. 05/01/20   Deno Etienne, DO  capecitabine (XELODA) 500 MG tablet Take 4 tablets (2,000 mg total) by mouth 2 (two) times daily after a meal. Take for 14 days, then off for 7 days. 10/15/21   Truitt Merle, MD  ferrous sulfate 325 (65 FE) MG tablet TAKE 1 TABLET BY MOUTH 2 TIMES DAILY WITH A MEAL. 09/16/21   Truitt Merle, MD  gabapentin (NEURONTIN) 300 MG capsule TAKE 1 CAPSULE BY MOUTH TWICE A DAY 07/22/21   [name], NP  hydrALAZINE (APRESOLINE) 25 MG tablet TAKE 1 TABLET BY MOUTH EVERY 8 HOURS 02/09/21   [name], [name], MD  hydrochlorothiazide (HYDRODIURIL) 12.5 MG tablet TAKE 1 TABLET BY MOUTH EVERY DAY 10/30/21   [name], [name], MD  hydrocortisone (ANUSOL-HC) 2.5 % rectal cream Place 1  application rectally 2 (two) times daily. 08/23/19   Joy, Shawn C, PA-C  Ibuprofen 200 MG CAPS Take 400 mg by mouth daily as needed (pain).    [provider]  isosorbide mononitrate (IMDUR) 30 MG 24 hr tablet TAKE 1 TABLET BY MOUTH EVERY DAY 06/01/21   [name], [name], MD  lidocaine (LMX) 4 % cream Apply 1 application topically 3 (three) times daily as needed. 08/23/19   Joy, Shawn C, PA-C  lidocaine-prilocaine (EMLA) cream Apply 1 application topically as needed. 06/01/21   Truitt Merle, MD  lisinopril (ZESTRIL) 40 MG tablet Take 1 tablet (40 mg total) by mouth daily. 11/14/20   [name], [name], MD  loperamide (IMODIUM) 2 MG capsule TAKE 1 CAPSULE (2 MG TOTAL) BY MOUTH AS NEEDED FOR DIARRHEA OR LOOSE  STOOLS. 07/16/19   Truitt Merle, MD  metoprolol tartrate (LOPRESSOR) 50 MG tablet TAKE 1 TABLET BY MOUTH TWICE A DAY 07/22/21   [name], [name], MD  ondansetron (ZOFRAN ODT) 4 MG disintegrating tablet '4mg'$  ODT q4 hours prn nausea/vomit 05/01/20   Deno Etienne, DO  ondansetron (ZOFRAN) 4 MG tablet Take 1 tablet (4 mg total) by mouth every 6 (six) hours as needed for nausea. 02/03/19   Debbe Odea, MD  polycarbophil (FIBERCON) 625 MG tablet Take 1 tablet (625 mg total) by mouth daily. 02/03/19   Debbe Odea, MD  prochlorperazine (COMPAZINE) 10 MG tablet Take 1 tablet (10 mg total) by mouth every 6 (six) hours as needed (Nausea or vomiting). 02/15/19 01/21/20  Truitt Merle, MD     Allergies:   No Known Allergies  Social History:   Social History   Socioeconomic History   Marital status: Single    Spouse name: Not on file   Number of children: 2   Years of education: Not on file   Highest education level: Not on file  Occupational History   Not on file  Tobacco Use   Smoking status: Former    Packs/day: 0.50    Years: 29.00    Total pack years: 14.50    Types: Cigarettes    Quit date: 01/04/2019    Years since quitting: 2.8   Smokeless tobacco: Never   Tobacco comments:    desires patch  Vaping Use   Vaping Use: Never used  Substance and Sexual Activity   Alcohol use: Not Currently    Comment: a pint a week   Drug use: Never   Sexual activity: Not on file  Other Topics Concern   Not on file  Social History Narrative   Not on file   Social Determinants of Health   Financial Resource Strain: Not on file  Food Insecurity: Not on file  Transportation Needs: Not on file  Physical Activity: Not on file  Stress: Not on file  Social Connections: Not on file  Intimate Partner Violence: Not on file    Family History:   The patient's family history includes Heart murmur in her mother; Hypertension in her father and sister; Kidney failure in her father.    ROS:  Please see  the history of present illness. All other ROS reviewed and negative.     Physical Exam/Data:   Vitals:   11/03/21 1605 11/03/21 1730  BP: (!) 140/70 127/65  Pulse: (!) 43 (!) 43  Resp: 18 17  Temp: 98.2 F (36.8 C)   TempSrc: Oral   SpO2: 100% 100%   No intake or output data in the 24 hours  ending 11/03/21 1823    11/03/2021    1:33 PM 11/03/2021   12:19 PM 10/13/2021    1:07 PM  Last 3 Weights  Weight (lbs) 247 lb 8 oz 162 lb 8 oz 246 lb 1.6 oz  Weight (kg) 112.265 kg 73.71 kg 111.63 kg     There is no height or weight on file to calculate BMI.  General:  Well nourished, well developed, in no acute distress HEENT: normal Neck: no JVD Vascular: No carotid bruits; Distal pulses 2+ bilaterally   Cardiac:  normal S1, S2; RRR; no murmur  Lungs:  clear to auscultation bilaterally, no wheezing, rhonchi or rales, has a port that has been accessed R chest Abd: soft, tender, no hepatomegaly, colostomy bag in place Ext: no edema Musculoskeletal:  No deformities, BUE and BLE strength normal and equal Skin: warm and dry  Neuro:  CNs 2-12 intact, no focal abnormalities noted Psych:  Normal affect    EKG:  The ECG that was done 08/01 was personally reviewed and demonstrates CHB w/ junctional rhythm, HR 51  Relevant CV Studies: None  Laboratory Data:  High Sensitivity Troponin:  No results for input(s): "TROPONINIHS" in the last 720 hours.    Chemistry Recent Labs  Lab 11/03/21 1159 11/03/21 1647  NA 135 135  K 4.5 4.6  CL 109 111  CO2 23 20*  GLUCOSE 88 83  BUN 26* 22  CREATININE 1.02* 1.15*  CALCIUM 9.2 9.5  GFRNONAA >60 54*  ANIONGAP 3* 4*    Recent Labs  Lab 11/03/21 1159  PROT 7.4  ALBUMIN 3.7  AST 15  ALT 7  ALKPHOS 71  BILITOT 0.4   Lipids No results for input(s): "CHOL", "TRIG", "HDL", "LABVLDL", "LDLCALC", "CHOLHDL" in the last 168 hours. Hematology Recent Labs  Lab 11/03/21 1159 11/03/21 1647  WBC 7.1 7.4  RBC 3.44* 3.71*  HGB 11.7* 12.5  HCT  35.1* 39.0  MCV 102.0* 105.1*  MCH 34.0 33.7  MCHC 33.3 32.1  RDW 17.2* 17.2*  PLT 134* 144*   Thyroid No results for input(s): "TSH", "FREET4" in the last 168 hours. BNPNo results for input(s): "BNP", "PROBNP" in the last 168 hours.  DDimer No results for input(s): "DDIMER" in the last 168 hours.   Radiology/Studies:  DG Chest 2 View  Result Date: 11/03/2021 CLINICAL DATA:  Fatigue and weakness.  Cancer patient. EXAM: CHEST - 2 VIEW COMPARISON:  Chest 12/01/2020 FINDINGS: Heart size upper normal. Vascularity normal. Lungs clear without infiltrate effusion or mass. Port-A-Cath tip in the SVC unchanged. No acute skeletal abnormality. IMPRESSION: No active cardiopulmonary disease. Electronically Signed   By: Franchot Gallo M.D.   On: 11/03/2021 17:18     Assessment and Plan:   CHB - in the setting of metop 50 mg bid - has been on for years, but no sx until a week ago.  - prev ECG had HR 51, some variability in P-P interval, no obvious heart block - now in CHB w/ junct escape rhythm - plan: hold BB, admit overnight, ck echo, hopefully, rhythm will normalize and she can be d/c'd sometime tomorrow  2. Colon CA and other issues - nothing active, continue home meds   Risk Assessment/Risk Scores:     Severity of Illness: The appropriate patient status for this patient is OBSERVATION. Observation status is judged to be reasonable and necessary in order to provide the required intensity of service to ensure the patient's safety. The patient's presenting symptoms, physical exam findings, and  initial radiographic and laboratory data in the context of their medical condition is felt to place them at decreased risk for further clinical deterioration. Furthermore, it is anticipated that the patient will be medically stable for discharge from the hospital within 2 midnights of admission.    For questions or updates, please contact [place] Please consult [email or web address] for contact info  under     Signed, Rosaria Ferries, PA-C  11/03/2021 6:23 PM   History and all data above reviewed.  The patient has no past cardiac history other than being told that she has a slow heart rate.  She has had difficult to control HTN and is actively being managed for stage IV colon CA.  She has done well with surgery for this and chemo.  However for the past week she says she has had increasing fatigue.  She normally can do her household chores and does some walking for activity and now she has not been able to do this.  She is not going to sleep excessively.  She has not had any presyncope or syncope.  She denies any chest pressure, neck or arm discomfort.  She said no weight gain or edema.  She had no new shortness of breath, PND or orthopnea.  She does not snore.  She was being seen by her oncologist who called because patient's heart rate was in the 40s with an EKG that did demonstrate complete heart block.. Patient examined.  I agree with the findings as above.  The patient exam reveals COR: Regular rate and rhythm, no murmurs.,  Lungs: Clear to auscultation bilaterally.,  Abd: Positive bowel sounds with colostomy in place, Ext 2+ pulses, no edema.  All available labs, radiology testing, previous records reviewed. Agree with documented assessment and plan.  Complete heart block: I suspect this is related to beta-blocker and hopefully this will resolve as this washes out.  Has not had any overt symptoms and has good blood pressure.  We will observe her overnight.  She can continue the other meds except for the metoprolol.    Interestingly I did see a brief run of supraventricular tachycardia possibly atrial fib on the monitor.  We will follow for this as well.  Jeneen Rinks Donnette Macmullen  7:06 PM  11/03/2021

## 2021-11-03 NOTE — ED Notes (Signed)
R. Barrett PA-C with cardiology called this RN and confirmed that pt does have proper port access with blood return. Verbal orders given to this RN via phone call that allow inpatient use of port for lab work and medication infusion. Charge nurse for [place] notified of the same.

## 2021-11-03 NOTE — ED Triage Notes (Signed)
Pt sent here by cancer center for observation due to low HR. PT has no hx of bradycardia, is on metoprolol but denies any changes in dose. Pt reports feeling tired and fatigued, denies dizziness. Per daughter pt also had an episode of watery diarrhea in her ostomy bag last week.

## 2021-11-03 NOTE — ED Provider Notes (Signed)
[facility] EMERGENCY DEPARTMENT Provider Note   CSN: [id number] Arrival date & time: 11/03/21  1557     History Chief Complaint  Patient presents with   Bradycardia     Casey Black is a 61 y.o. female presenting for chief complaint of abnormal vitals at clinic today.  HPI This is 61 year old female with an extensive medical history presenting with a chief complaint of bradycardia at clinic today.  She denies fevers chills nausea vomiting, syncope or shortness of breath.  She has been ambulatory tolerating p.o. intake.  She does state that she has been having lightheadedness intermittently over the past 3 to 4 days.  She reported this to the oncology infusion center where she is getting chemotherapy treatment for her colon cancer.  They recommended they come to the emergency department for further care and management.  She denies any chest pain nor pain on inspiration. No known sick contacts.     Home Medications Prior to Admission medications   Medication Sig Start Date End Date Taking? Authorizing Provider  acetaminophen (TYLENOL) 325 MG tablet Take 2 tablets (650 mg total) by mouth every 6 (six) hours as needed. 02/03/19  Yes Debbe Odea, MD  amLODipine (NORVASC) 10 MG tablet TAKE 1 TABLET BY MOUTH EVERY DAY 09/11/21  Yes [name], [name], MD  benzonatate (TESSALON) 100 MG capsule Take 1 capsule (100 mg total) by mouth every 8 (eight) hours. 05/01/20  Yes Deno Etienne, DO  capecitabine (XELODA) 500 MG tablet Take 4 tablets (2,000 mg total) by mouth 2 (two) times daily after a meal. Take for 14 days, then off for 7 days. 10/15/21  Yes Truitt Merle, MD  ferrous sulfate 325 (65 FE) MG tablet TAKE 1 TABLET BY MOUTH 2 TIMES DAILY WITH A MEAL. 09/16/21  Yes Truitt Merle, MD  gabapentin (NEURONTIN) 300 MG capsule TAKE 1 CAPSULE BY MOUTH TWICE A DAY 07/22/21  Yes [name], NP  hydrALAZINE (APRESOLINE) 25 MG tablet TAKE 1 TABLET BY MOUTH EVERY 8 HOURS 02/09/21   Yes [name], [name], MD  hydrochlorothiazide (HYDRODIURIL) 12.5 MG tablet TAKE 1 TABLET BY MOUTH EVERY DAY 10/30/21  Yes [name], [name], MD  hydrocortisone (ANUSOL-HC) 2.5 % rectal cream Place 1 application rectally 2 (two) times daily. 08/23/19  Yes Joy, Shawn C, PA-C  Ibuprofen 200 MG CAPS Take 400 mg by mouth daily as needed (pain).   Yes [provider]  isosorbide mononitrate (IMDUR) 30 MG 24 hr tablet TAKE 1 TABLET BY MOUTH EVERY DAY 06/01/21  Yes [name], [name], MD  lidocaine (LMX) 4 % cream Apply 1 application topically 3 (three) times daily as needed. 08/23/19  Yes Joy, Shawn C, PA-C  lidocaine-prilocaine (EMLA) cream Apply 1 application topically as needed. 06/01/21  Yes Truitt Merle, MD  lisinopril (ZESTRIL) 40 MG tablet Take 1 tablet (40 mg total) by mouth daily. 11/14/20  Yes [name], [name], MD  polycarbophil (FIBERCON) 625 MG tablet Take 1 tablet (625 mg total) by mouth daily. 02/03/19  Yes Debbe Odea, MD  loperamide (IMODIUM) 2 MG capsule TAKE 1 CAPSULE (2 MG TOTAL) BY MOUTH AS NEEDED FOR DIARRHEA OR LOOSE STOOLS. Patient not taking: Reported on 11/03/2021 07/16/19   Truitt Merle, MD  ondansetron [facility] ODT) 4 MG disintegrating tablet '4mg'$  ODT q4 hours prn nausea/vomit Patient not taking: Reported on 11/03/2021 05/01/20   Deno Etienne, DO  ondansetron (ZOFRAN) 4 MG tablet Take 1 tablet (4 mg total) by  mouth every 6 (six) hours as needed for nausea. Patient not taking: Reported on 11/03/2021 02/03/19   Debbe Odea, MD  prochlorperazine (COMPAZINE) 10 MG tablet Take 1 tablet (10 mg total) by mouth every 6 (six) hours as needed (Nausea or vomiting). 02/15/19 01/21/20  Truitt Merle, MD      Allergies    Patient has no known allergies.    Review of Systems   Review of Systems  Constitutional:  Negative for chills and fever.  HENT:  Negative for ear pain and sore throat.   Eyes:  Negative for pain and visual disturbance.  Respiratory:  Negative  for cough and shortness of breath.   Cardiovascular:  Negative for chest pain and palpitations.  Gastrointestinal:  Negative for abdominal pain and vomiting.  Genitourinary:  Negative for dysuria and hematuria.  Musculoskeletal:  Negative for arthralgias and back pain.  Skin:  Negative for color change and rash.  Neurological:  Positive for light-headedness. Negative for seizures and syncope.  All other systems reviewed and are negative.   Physical Exam Updated Vital Signs BP (!) 140/62   Pulse (!) 46   Temp 97.7 F (36.5 C) (Oral)   Resp 17   Ht '5\' 7"'$  (1.702 m)   Wt 111.9 kg   SpO2 100%   BMI 38.65 kg/m  Physical Exam Vitals and nursing note reviewed.  Constitutional:      General: She is not in acute distress.    Appearance: She is well-developed.  HENT:     Head: Normocephalic and atraumatic.  Eyes:     Conjunctiva/sclera: Conjunctivae normal.  Cardiovascular:     Rate and Rhythm: Normal rate and regular rhythm.     Heart sounds: No murmur heard. Pulmonary:     Effort: Pulmonary effort is normal. No respiratory distress.     Breath sounds: Normal breath sounds.  Abdominal:     Palpations: Abdomen is soft.     Tenderness: There is no abdominal tenderness.  Musculoskeletal:        General: Deformity (Ostomy site present.  Clean dry intact.) present. No swelling.     Cervical back: Neck supple.  Skin:    General: Skin is warm and dry.     Capillary Refill: Capillary refill takes less than 2 seconds.  Neurological:     Mental Status: She is alert.  Psychiatric:        Mood and Affect: Mood normal.      ED Course/ Medical Decision Making/ A&P Clinical Course as of 11/03/21 2330  Tue Nov 03, 2021  1822 CO2(!): 20 [CC]    Clinical Course User Index [CC] Tretha Sciara, MD    Procedures Procedures   Medications Ordered in ED Medications  nitroGLYCERIN (NITROSTAT) SL tablet 0.4 mg (has no administration in time range)  acetaminophen (TYLENOL) tablet  650 mg (has no administration in time range)  ondansetron (ZOFRAN) injection 4 mg (has no administration in time range)  zolpidem (AMBIEN) tablet 5 mg (has no administration in time range)  enoxaparin (LOVENOX) injection 40 mg (40 mg Subcutaneous Given 11/03/21 2321)  sodium chloride flush (NS) 0.9 % injection 3 mL (3 mLs Intravenous Given 11/03/21 2322)  sodium chloride flush (NS) 0.9 % injection 3 mL (has no administration in time range)  0.9 %  sodium chloride infusion (has no administration in time range)  ALPRAZolam (XANAX) tablet 0.25 mg (has no administration in time range)  benzonatate (TESSALON) capsule 100 mg (has no administration in time range)  capecitabine (  XELODA) tablet 2,000 mg (has no administration in time range)  ferrous sulfate tablet 325 mg (has no administration in time range)  gabapentin (NEURONTIN) capsule 300 mg (300 mg Oral Given 11/03/21 2321)  hydrALAZINE (APRESOLINE) tablet 25 mg (25 mg Oral Given 11/03/21 2321)  hydrocortisone (ANUSOL-HC) 2.5 % rectal cream 1 Application (1 Application Rectal Not Given 11/03/21 2322)  polycarbophil (FIBERCON) tablet 625 mg (has no administration in time range)  amLODipine (NORVASC) tablet 10 mg (has no administration in time range)  isosorbide mononitrate (IMDUR) 24 hr tablet 30 mg (has no administration in time range)    Medical Decision Making:    Casey Black is a 61 y.o. female who presented to the ED today with presyncopal episodes detailed above.     Additional history discussed with patient's family/caregivers.  Patient's presentation is complicated by their history of ongoing colon cancer receiving chemotherapy.  Patient placed on continuous vitals and telemetry monitoring while in ED which was reviewed periodically.      Complete initial physical exam performed, notably the patient  was hemodynamically stable in no acute distress.  She is communicative and currently asymptomatic.  She does have ongoing bradycardia..         Reviewed and confirmed nursing documentation for past medical history, family history, social history.       Initial Assessment:    With the patient's presentation of lightheaded episodes in the setting of worsening bradycardia, most likely diagnosis is developing heart block. Other diagnoses were considered including (but not limited to) ACS, pulmonary embolism, aortic dissection, carotid arterial dissection. These are considered less likely due to history of present illness and physical exam findings.   This is most consistent with an acute life/limb threatening illness complicated by underlying chronic conditions.     Initial Plan:  Screening labs including CBC and Metabolic panel to evaluate for infectious or metabolic etiology of disease.  CXR to evaluate for structural/infectious intrathoracic pathology.  EKG and delta troponin to evaluate for cardiac etiology of patient's symptoms Objective evaluation as below reviewed       Initial Study Results:    Laboratory  All laboratory results reviewed without evidence of clinically relevant pathology.       EKG EKG was reviewed independently. Rate, rhythm, axis, intervals all examined and without medically relevant abnormality. ST segments without concerns for elevations.      Radiology: All images reviewed independently. Agree with radiology report at this time.   DG Chest 2 View  Final Result        Consults: Case discussed with cardiology.      Final Assessment and Plan:    Ultimately, cardiology plans to admit patient for ongoing care and management given her symptomatic bradycardia.  Patient admitted with no further acute events prior to admission. Disposition:   Based on the above findings, I believe this patient is stable for admission.       Patient/family educated about specific findings on our evaluation and explained exact reasons for admission.  Patient/family educated about clinical situation and  time was allowed to answer questions.   Admission team communicated with and agreed with need for admission. Patient admitted. Patient ready to move at this time.        Emergency Department Medication Summary:   Medications  nitroGLYCERIN (NITROSTAT) SL tablet 0.4 mg (has no administration in time range)  acetaminophen (TYLENOL) tablet 650 mg (has no administration in time range)  ondansetron (ZOFRAN) injection 4 mg (has no administration  in time range)  zolpidem (AMBIEN) tablet 5 mg (has no administration in time range)  enoxaparin (LOVENOX) injection 40 mg (40 mg Subcutaneous Given 11/03/21 2321)  sodium chloride flush (NS) 0.9 % injection 3 mL (3 mLs Intravenous Given 11/03/21 2322)  sodium chloride flush (NS) 0.9 % injection 3 mL (has no administration in time range)  0.9 %  sodium chloride infusion (has no administration in time range)  ALPRAZolam (XANAX) tablet 0.25 mg (has no administration in time range)  benzonatate (TESSALON) capsule 100 mg (has no administration in time range)  capecitabine (XELODA) tablet 2,000 mg (has no administration in time range)  ferrous sulfate tablet 325 mg (has no administration in time range)  gabapentin (NEURONTIN) capsule 300 mg (300 mg Oral Given 11/03/21 2321)  hydrALAZINE (APRESOLINE) tablet 25 mg (25 mg Oral Given 11/03/21 2321)  hydrocortisone (ANUSOL-HC) 2.5 % rectal cream 1 Application (1 Application Rectal Not Given 11/03/21 2322)  polycarbophil (FIBERCON) tablet 625 mg (has no administration in time range)  amLODipine (NORVASC) tablet 10 mg (has no administration in time range)  isosorbide mononitrate (IMDUR) 24 hr tablet 30 mg (has no administration in time range)               Clinical Impression:  1. Bradycardia         Admit   Final Clinical Impression(s) / ED Diagnoses Final diagnoses:  Bradycardia    Rx / DC Orders ED Discharge Orders     None         Tretha Sciara, MD 11/03/21 2330

## 2021-11-03 NOTE — ED Provider Triage Note (Signed)
Emergency Medicine Provider Triage Evaluation Note  Casey Black , a 61 y.o. female  was evaluated in triage.  Pt complains of weakness and fatigue.  She was seen at cancer center today for adenocarcinoma with mets.  Has had increased fatigue and weakness over the last few days.  We will getting her infusion they noted significant bradycardia into the low 40s.  They recommended patient come here for evaluation.  No fever, nausea, vomiting, pain  Apparently cancer center told patient she would be admitted for observation???  On metoprolol   Review of Systems  Positive: Weakness, bradycardia Negative:   Physical Exam  BP (!) 140/70 (BP Location: Right Arm)   Pulse (!) 43   Temp 98.2 F (36.8 C) (Oral)   Resp 18   SpO2 100%  Gen:   Awake, no distress   Resp:  Normal effort  MSK:   Moves extremities without difficulty  Other:    Medical Decision Making  Medically screening exam initiated at 4:15 PM.  Appropriate orders placed.  Ary Rudnick was informed that the remainder of the evaluation will be completed by another provider, this initial triage assessment does not replace that evaluation, and the importance of remaining in the ED until their evaluation is complete.  Fatigue, bradycardia   Meghanne Pletz A, PA-C 11/03/21 1617

## 2021-11-03 NOTE — Patient Instructions (Signed)
[facility]COL[facility]  Discharge Instructions: °Thank you for choosing [facility] to provide your oncology and hematology care.  ° °If you have a lab appointment with the Cancer Center, please go directly to the Cancer Center and check in at the registration area. °  °Wear comfortable clothing and clothing appropriate for easy access to any Portacath or PICC line.  ° °We strive to give you quality time with your provider. You may need to reschedule your appointment if you arrive late (15 or more minutes).  Arriving late affects you and other patients whose appointments are after yours.  Also, if you miss three or more appointments without notifying the office, you may be dismissed from the clinic at the provider’s discretion.    °  °For prescription refill requests, have your pharmacy contact our office and allow 72 hours for refills to be completed.   ° °Today you received the following chemotherapy and/or immunotherapy agents: Bevacizumab.     °  °To help prevent nausea and vomiting after your treatment, we encourage you to take your nausea medication as directed. ° °BELOW ARE SYMPTOMS THAT SHOULD BE REPORTED IMMEDIATELY: °*FEVER GREATER THAN 100.4 F (38 °C) OR HIGHER °*CHILLS OR SWEATING °*NAUSEA AND VOMITING THAT IS NOT CONTROLLED WITH YOUR NAUSEA MEDICATION °*UNUSUAL SHORTNESS OF BREATH °*UNUSUAL BRUISING OR BLEEDING °*URINARY PROBLEMS (pain or burning when urinating, or frequent urination) °*BOWEL PROBLEMS (unusual diarrhea, constipation, pain near the anus) °TENDERNESS IN MOUTH AND THROAT WITH OR WITHOUT PRESENCE OF ULCERS (sore throat, sores in mouth, or a toothache) °UNUSUAL RASH, SWELLING OR PAIN  °UNUSUAL VAGINAL DISCHARGE OR ITCHING  ° °Items with * indicate a potential emergency and should be followed up as soon as possible or go to the Emergency Department if any problems should occur. ° °Please show the CHEMOTHERAPY ALERT CARD or IMMUNOTHERAPY ALERT CARD at check-in  to the Emergency Department and triage nurse. ° °Should you have questions after your visit or need to cancel or reschedule your appointment, please contact [facility]  Dept: [phone]  and follow the prompts.  Office hours are 8:00 a.m. to 4:30 p.m. Monday - Friday. Please note that voicemails left after 4:00 p.m. may not be returned until the following business day.  We are closed weekends and major holidays. You have access to a nurse at all times for urgent questions. Please call the main number to the clinic Dept: [phone] and follow the prompts. ° ° °For any non-urgent questions, you may also contact your provider using MyChart. We now offer e-Visits for anyone 18 and older to request care online for non-urgent symptoms. For details visit [email or web address]. °  °Also download the MyChart app! Go to the app store, search "MyChart", open the app, select , and log in with your MyChart username and password. ° °Due to Covid, a mask is required upon entering the hospital/clinic. If you do not have a mask, one will be given to you upon arrival. For doctor visits, patients may have 1 support person aged 18 or older with them. For treatment visits, patients cannot have anyone with them due to current Covid guidelines and our immunocompromised population.  ° °

## 2021-11-04 ENCOUNTER — Observation Stay (HOSPITAL_BASED_OUTPATIENT_CLINIC_OR_DEPARTMENT_OTHER): Payer: Medicare Other

## 2021-11-04 ENCOUNTER — Encounter (HOSPITAL_COMMUNITY): Payer: Self-pay | Admitting: Cardiology

## 2021-11-04 ENCOUNTER — Other Ambulatory Visit: Payer: Self-pay | Admitting: Internal Medicine

## 2021-11-04 DIAGNOSIS — Z85038 Personal history of other malignant neoplasm of large intestine: Secondary | ICD-10-CM | POA: Diagnosis not present

## 2021-11-04 DIAGNOSIS — Z0000 Encounter for general adult medical examination without abnormal findings: Secondary | ICD-10-CM

## 2021-11-04 DIAGNOSIS — Z79899 Other long term (current) drug therapy: Secondary | ICD-10-CM | POA: Diagnosis not present

## 2021-11-04 DIAGNOSIS — Z9049 Acquired absence of other specified parts of digestive tract: Secondary | ICD-10-CM | POA: Diagnosis not present

## 2021-11-04 DIAGNOSIS — R9431 Abnormal electrocardiogram [ECG] [EKG]: Secondary | ICD-10-CM | POA: Diagnosis not present

## 2021-11-04 DIAGNOSIS — I442 Atrioventricular block, complete: Secondary | ICD-10-CM | POA: Diagnosis not present

## 2021-11-04 LAB — BASIC METABOLIC PANEL
Anion gap: 3 — ABNORMAL LOW (ref 5–15)
BUN: 20 mg/dL (ref 8–23)
CO2: 22 mmol/L (ref 22–32)
Calcium: 9.2 mg/dL (ref 8.9–10.3)
Chloride: 112 mmol/L — ABNORMAL HIGH (ref 98–111)
Creatinine, Ser: 0.92 mg/dL (ref 0.44–1.00)
GFR, Estimated: >60 mL/min (ref >60)
Glucose, Bld: 93 mg/dL (ref 70–99)
Potassium: 4.4 mmol/L (ref 3.5–5.1)
Sodium: 137 mmol/L (ref 135–145)

## 2021-11-04 LAB — ECHOCARDIOGRAM COMPLETE
Area-P 1/2: 3 cm2
Calc EF: 71.1 %
Height: 67 in
S' Lateral: 3.7 cm
Single Plane A2C EF: 78.7 %
Single Plane A4C EF: 63.6 %
Weight: 3948.8 oz

## 2021-11-04 LAB — TROPONIN I (HIGH SENSITIVITY): Troponin I (High Sensitivity): 8 ng/L (ref <18)

## 2021-11-04 LAB — T4, FREE: Free T4: 1.01 ng/dL (ref 0.61–1.12)

## 2021-11-04 LAB — TSH: TSH: 5.504 u[IU]/mL — ABNORMAL HIGH (ref 0.350–4.500)

## 2021-11-04 LAB — HIV ANTIBODY (ROUTINE TESTING W REFLEX): HIV Screen 4th Generation wRfx: NONREACTIVE

## 2021-11-04 MED ORDER — CHLORHEXIDINE GLUCONATE CLOTH 2 % EX PADS
6.0000 | MEDICATED_PAD | Freq: Every day | CUTANEOUS | Status: DC
  Administered 2021-11-04: 6 via TOPICAL

## 2021-11-04 NOTE — Discharge Summary (Addendum)
Discharge Summary    Patient ID: Casey Black MRN: [id number]; DOB: September 16, 1960  Admit date: 11/03/2021 Discharge date: 11/04/2021  PCP:  [name], [name], MD   [facility] Providers Cardiologist:  Minus Breeding, MD   {   Discharge Diagnoses    Principal Problem:   CHB (complete heart block) [employer])    Diagnostic Studies/Procedures    Echo is pending  _____________   History of Present Illness     Per admission H&P on 11/03/21:  Casey Black is a 61 y.o. female with colon CA, periph neuropathy 2nd chemo, IDA 2nd CA, HTN, SBO, who was being seen 11/03/2021 for the evaluation of bradycardia.   [name] was seen by Dr Burr Medico 11/03/21 for CA f/u. In the office, she was bradycardic with junctional rhythm. Pt sent to the ER and Cards asked to admit.    She was on metoprolol 50 mg bid at home. Of note, her HR has been in the low 40s at times (41 on 06/06/2020) with no intervention.    [name] has been compliant w/ metop 50 mg bid. The people at the [facility] have mentioned her HR to her in the past.    She has not been aware of any sx related to the bradycardia until about a week ago.    Since then she has been weak. A little dizzy, tired. No presyncope or syncope. No palpitations.    No chest pain or SOB.   Hospital Course     Consultants: N/A   Complete heart block  - Hs trop negative x2 - home med metoprolol '50mg'$  BID has been discontinued - bradycardia has improved, HR 50-60s while awake, 40-50s while sleep, feeling improved without symptoms now  - Echo completed, reviewed with MD, no significant abnormalities  - TSH elevated 5.5, FT4 WNL - discharge today, follow up as needed per MD  Paroxysmal SVT - noted a brief run on ER telemetry per MD, no further SVT noted so far   HTN - Historically difficult control, BP has been stable here, continue hydralazine, isosorbide, lisinopril, amlodipine and HCTZ ; may up-titrate if BP rising      Adenocarcinoma of transverse colon, 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          _____________  Discharge Vitals Blood pressure (!) 130/59, pulse (!) 48, temperature 97.6 F (36.4 C), temperature source Oral, resp. rate 17, height '5\' 7"'$  (1.702 m), weight 111.9 kg, SpO2 98 %.  Filed Weights   11/03/21 2152  Weight: 111.9 kg  Vitals:  Vitals:   11/04/21 0531 11/04/21 0829  BP: (!) 126/59 (!) 130/59  Pulse:  (!) 48  Resp:  17  Temp:  97.6 F (36.4 C)  SpO2:  98%   General Appearance: In no apparent distress, laying in bed HEENT: Normocephalic, atraumatic.  Neck: Supple, trachea midline, no JVDs Cardiovascular: Regular rate and rhythm, normal S1-S2,  no murmur  Respiratory: Resting breathing unlabored, lungs sounds clear to auscultation bilaterally, no use of accessory  muscles. On room air.   Gastrointestinal: Abdomen soft  Extremities: Able to move all extremities in bed without difficulty, no edema Musculoskeletal: Normal muscle bulk and tone Skin: Intact, warm, dry. No rashes or petechiae noted in exposed areas.  Neurologic: Alert, oriented to person, place and time. Fluent speech, no facial droop, no cognitive deficit Psychiatric: Normal affect. Mood is appropriate.     Labs & Radiologic Studies    CBC Recent Labs    11/03/21 1159 11/03/21 1647  WBC 7.1 7.4  NEUTROABS 3.6 3.8  HGB 11.7* 12.5  HCT 35.1* 39.0  MCV 102.0* 105.1*  PLT 134* 841*   Basic Metabolic Panel Recent Labs    11/03/21 1159 11/03/21 1647  NA 135 135  K 4.5 4.6  CL 109 111  CO2 23 20*  GLUCOSE 88 83  BUN 26* 22  CREATININE 1.02* 1.15*  CALCIUM 9.2 9.5    Liver Function Tests Recent Labs    11/03/21 1159  AST 15  ALT 7  ALKPHOS 71  BILITOT 0.4  PROT 7.4  ALBUMIN 3.7   No results for input(s): "LIPASE", "AMYLASE" in the last 72 hours. High Sensitivity Troponin:   Recent Labs  Lab 11/03/21 2015 11/04/21 0006  TROPONINIHS 7 8    BNP Invalid input(s): "POCBNP" D-Dimer No results for input(s): "DDIMER" in the last 72 hours. Hemoglobin A1C No results for input(s): "HGBA1C" in the last 72 hours. Fasting Lipid Panel No results for input(s): "CHOL", "HDL", "LDLCALC", "TRIG", "CHOLHDL", "LDLDIRECT" in the last 72 hours. Thyroid Function Tests Recent Labs    11/04/21 0537  TSH 5.504*   _____________  DG Chest 2 View  Result Date: 11/03/2021 CLINICAL DATA:  Fatigue and weakness.  Cancer patient. EXAM: CHEST - 2 VIEW COMPARISON:  Chest 12/01/2020 FINDINGS: Heart size upper normal. Vascularity normal. Lungs clear without infiltrate effusion or mass. Port-A-Cath tip in the SVC unchanged. No acute skeletal abnormality. IMPRESSION: No active cardiopulmonary disease. Electronically Signed   By: Franchot Gallo M.D.   On: 11/03/2021 17:18    Disposition   Pt is being discharged home today in good condition.  Follow-up Plans & Appointments     Follow-up Information     Almyra Deforest, [place] Follow up on 11/16/2021.   Specialties: Cardiology, Radiology Why: at 10:55AM for your cardiology follow up appointment Contact information: [address] [place] [address] [phone]                Discharge Instructions     Diet - low sodium heart healthy   Complete by: As directed    Increase activity slowly   Complete by: As directed        Discharge Medications   Allergies as of 11/04/2021   No Known Allergies      Medication List     STOP taking these medications    loperamide 2 MG capsule Commonly known as: IMODIUM   ondansetron 4 MG disintegrating tablet Commonly known as: Zofran ODT    ondansetron 4 MG tablet Commonly known as: ZOFRAN       TAKE these medications    acetaminophen 325 MG tablet Commonly known as: TYLENOL Take 2 tablets (650 mg total) by mouth every 6 (six) hours as needed.   amLODipine 10 MG tablet Commonly known as: NORVASC TAKE 1 TABLET BY MOUTH EVERY DAY   benzonatate 100 MG capsule Commonly known as: TESSALON Take 1 capsule (100 mg total) by mouth every 8 (eight) hours.  capecitabine 500 MG tablet Commonly known as: XELODA Take 4 tablets (2,000 mg total) by mouth 2 (two) times daily after a meal. Take for 14 days, then off for 7 days.   ferrous sulfate 325 (65 FE) MG tablet TAKE 1 TABLET BY MOUTH 2 TIMES DAILY WITH A MEAL.   gabapentin 300 MG capsule Commonly known as: NEURONTIN TAKE 1 CAPSULE BY MOUTH TWICE A DAY   hydrALAZINE 25 MG tablet Commonly known as: APRESOLINE TAKE 1 TABLET BY MOUTH EVERY 8 HOURS   hydrochlorothiazide 12.5 MG tablet Commonly known as: HYDRODIURIL TAKE 1 TABLET BY MOUTH EVERY DAY   hydrocortisone 2.5 % rectal cream Commonly known as: Anusol-HC Place 1 application rectally 2 (two) times daily.   Ibuprofen 200 MG Caps Take 400 mg by mouth daily as needed (pain).   isosorbide mononitrate 30 MG 24 hr tablet Commonly known as: IMDUR TAKE 1 TABLET BY MOUTH EVERY DAY   lidocaine 4 % cream Commonly known as: LMX Apply 1 application topically 3 (three) times daily as needed.   lidocaine-prilocaine cream Commonly known as: EMLA Apply 1 application topically as needed.   lisinopril 40 MG tablet Commonly known as: ZESTRIL Take 1 tablet (40 mg total) by mouth daily.   polycarbophil 625 MG tablet Commonly known as: FIBERCON Take 1 tablet (625 mg total) by mouth daily.           Outstanding Labs/Studies   N/A  Duration of Discharge Encounter   Greater than 30 minutes including physician time.  Signed, Margie Billet, NP 11/04/2021, 9:34 AM  History and all data above reviewed.  Patient  examined.  I agree with the findings as above.  No dizziness.  Up and ambulating in the room.    The patient exam reveals COR:RRR, brief systolic murmur  ,  Lungs: Clear  ,  Abd: Positive bowel sounds, no rebound no guarding, Ext No edema  .  All available labs, radiology testing, previous records reviewed. Agree with documented assessment and plan.  CHB:  resolved off of beta blocker.  No change in therapy or other testing otherwise.  OK to go home.  We will see as needed.   Jeneen Rinks Lenell Lama  11:57 AM  11/04/2021

## 2021-11-04 NOTE — Care Management Obs Status (Signed)
[facility] NOTIFICATION   Patient Details  Name: Casey Black MRN: [id number] Date of Birth: 1961-03-16   Medicare Observation Status Notification Given:  Yes    Bethena Roys, RN 11/04/2021, 1:13 PM

## 2021-11-04 NOTE — Consult Note (Addendum)
[facility] Nurse ostomy consult note Patient receiving care in [place] Patient is independent with ostomy care. She has had this colostomy since 01/24/2019 Patient currently having Echo. Was not able to change the pouch.  Stoma type/location: RUQ transverse colostomy. Stomal assessment/size: Did not measure  Peristomal assessment:  Treatment options for stomal/peristomal skin: Barrier ring.  Output: mushy brown with gas in pouch Ostomy pouching: 2pc. 2 3/4 inch pouch Kellie Simmering # [phone]) Skin barrier Kellie Simmering # 2) barrier ring Kellie Simmering # [phone]) Education provided: None Enrolled patient in [place] program: Yes (Previously)  Had Network engineer to order supplies to be placed in patient room.   Thank you for the consult. [place] nurse will not follow at this time.   Please re-consult the [facility] team if needed.  Cathlean Marseilles Tamala Julian, MSN, RN, [place], Lysle Pearl, [facility] Wound Treatment Associate Pager [phone]

## 2021-11-04 NOTE — Plan of Care (Signed)
  Problem: Activity: Goal: Ability to tolerate increased activity will improve Outcome: Progressing   Problem: Cardiac: Goal: Ability to achieve and maintain adequate cardiopulmonary perfusion will improve Outcome: Progressing   

## 2021-11-05 ENCOUNTER — Ambulatory Visit
Admission: RE | Admit: 2021-11-05 | Discharge: 2021-11-05 | Disposition: A | Discharge status: Home / Self Care | Payer: Medicare Other | Source: Ambulatory Visit | Attending: Internal Medicine | Admitting: Internal Medicine

## 2021-11-05 ENCOUNTER — Telehealth: Payer: Self-pay | Admitting: Hematology

## 2021-11-05 DIAGNOSIS — Z1231 Encounter for screening mammogram for malignant neoplasm of breast: Secondary | ICD-10-CM

## 2021-11-05 NOTE — Telephone Encounter (Signed)
Scheduled follow-up appointment per 8/1 los. Patient is aware.

## 2021-11-10 ENCOUNTER — Other Ambulatory Visit (INDEPENDENT_AMBULATORY_CARE_PROVIDER_SITE_OTHER): Payer: Medicare Other

## 2021-11-10 ENCOUNTER — Other Ambulatory Visit: Payer: Self-pay | Admitting: *Deleted

## 2021-11-10 ENCOUNTER — Other Ambulatory Visit: Payer: Self-pay | Admitting: Internal Medicine

## 2021-11-10 DIAGNOSIS — E559 Vitamin D deficiency, unspecified: Secondary | ICD-10-CM

## 2021-11-10 LAB — VITAMIN D 25 HYDROXY (VIT D DEFICIENCY, FRACTURES): VITD: 21.2 ng/mL — ABNORMAL LOW (ref 30.00–100.00)

## 2021-11-10 MED ORDER — VITAMIN D (ERGOCALCIFEROL) 1.25 MG (50000 UNIT) PO CAPS: 50000.0000 [IU] | ORAL_CAPSULE | ORAL | 0 refills | Status: AC

## 2021-11-16 ENCOUNTER — Ambulatory Visit: Payer: Medicare Other | Admitting: Physician Assistant

## 2021-11-19 NOTE — Progress Notes (Signed)
Cardiology Clinic Note   Patient Name: Casey Black Date of Encounter: 11/20/2021  Primary Care Provider:  [name], [name], MD Primary Cardiologist:  Minus Breeding, MD  Patient Profile    61 y.o. female with colon CA, peripheral neuropathy 2nd chemo, IDA 2nd CA, HTN, SBO, complete heart block seen during recent hospitalization 11/03/2021, with prior history of paroxysmal SVT on metoprolol..  She had been on metoprolol 50 mg twice daily which was discontinued.  Bradycardia improved to heart rate in 50s to 60s while awake in 40s to 50s while sleeping.  Echocardiogram was also completed during hospitalization which reviewed no significant abnormalities.  She was noted to have elevated TSH.  She was discharged on 11/04/2021.  Past Medical History    Past Medical History:  Diagnosis Date   Anemia    low iron   Colon cancer ([name]) 01/2019   Hypertension 01/23/2019   Personal history of chemotherapy 01/2019   colon CA   SBO (small bowel obstruction) ([place]) 01/23/2019   Past Surgical History:  Procedure Laterality Date   CESAREAN SECTION     x2   COLOSTOMY N/A 01/24/2019   Procedure: End Loop Colostomy;  Surgeon: Clovis Riley, MD;  Location: [facility];  Service: General;  Laterality: N/A;   PARTIAL COLECTOMY N/A 01/24/2019   Procedure: PARTIAL COLECTOMY;  Surgeon: Clovis Riley, MD;  Location: [facility];  Service: General;  Laterality: N/A;   PORTACATH PLACEMENT Right 02/28/2019   Procedure: INSERTION PORT-A-CATH WITH ULTRASOUND GUIDANCE;  Surgeon: Clovis Riley, MD;  Location: [facility];  Service: General;  Laterality: Right;    Allergies  No Known Allergies  History of Present Illness    Casey Black is here for ongoing assessment and management of hypertension, bradycardia, with recent hospitalization where she was found to be in complete heart block.  Metoprolol was discontinued.  She was taking this for PSVT.  Blood pressure was found to be well controlled during  hospitalization and she was to continue on isosorbide, hydralazine, lisinopril, amlodipine and HCTZ. Discharged on 11/04/2021.  Today she has no significant complaints.  She has generalized fatigue but this is improved after stopping metoprolol during recent hospitalization.  She denies any rapid heart rhythms, irregular heart rhythm, palpitation, dizziness, or shortness of breath.  She continues to be treated for colon cancer with p.o. medications and followed by oncology.  Home Medications    Current Outpatient Medications  Medication Sig Dispense Refill   acetaminophen (TYLENOL) 325 MG tablet Take 2 tablets (650 mg total) by mouth every 6 (six) hours as needed.     amLODipine (NORVASC) 10 MG tablet TAKE 1 TABLET BY MOUTH EVERY DAY 90 tablet 1   benzonatate (TESSALON) 100 MG capsule Take 1 capsule (100 mg total) by mouth every 8 (eight) hours. 21 capsule 0   capecitabine (XELODA) 500 MG tablet Take 4 tablets (2,000 mg total) by mouth 2 (two) times daily after a meal. Take for 14 days, then off for 7 days. 112 tablet 1   ferrous sulfate 325 (65 FE) MG tablet TAKE 1 TABLET BY MOUTH 2 TIMES DAILY WITH A MEAL. 180 tablet 1   gabapentin (NEURONTIN) 300 MG capsule TAKE 1 CAPSULE BY MOUTH TWICE A DAY 60 capsule 3   hydrALAZINE (APRESOLINE) 25 MG tablet TAKE 1 TABLET BY MOUTH EVERY 8 HOURS 90 tablet 0   hydrochlorothiazide (HYDRODIURIL) 12.5 MG tablet TAKE 1 TABLET BY MOUTH EVERY DAY 90 tablet 0   hydrocortisone (ANUSOL-HC) 2.5 %  rectal cream Place 1 application rectally 2 (two) times daily. 30 g 0   Ibuprofen 200 MG CAPS Take 400 mg by mouth daily as needed (pain).     isosorbide mononitrate (IMDUR) 30 MG 24 hr tablet TAKE 1 TABLET BY MOUTH EVERY DAY 90 tablet 1   lidocaine (LMX) 4 % cream Apply 1 application topically 3 (three) times daily as needed. 30 g 0   lidocaine-prilocaine (EMLA) cream Apply 1 application topically as needed. 30 g 1   lisinopril (ZESTRIL) 40 MG tablet Take 1 tablet (40 mg  total) by mouth daily. 90 tablet 1   polycarbophil (FIBERCON) 625 MG tablet Take 1 tablet (625 mg total) by mouth daily. 30 tablet 0   Vitamin D, Ergocalciferol, (DRISDOL) 1.25 MG (50000 UNIT) CAPS capsule Take 1 capsule (50,000 Units total) by mouth every 7 (seven) days for 12 doses. 12 capsule 0   No current facility-administered medications for this visit.   Facility-Administered Medications Ordered in Other Visits  Medication Dose Route Frequency Provider Last Rate Last Admin   sodium chloride flush (NS) 0.9 % injection 10 mL  10 mL Intracatheter PRN Truitt Merle, MD   10 mL at 06/06/20 1051     Family History    Family History  Problem Relation Age of Onset   Heart murmur Mother    Kidney failure Father    Hypertension Father    Hypertension Sister    She indicated that her mother is deceased. She indicated that her father is deceased. She indicated that the status of her sister is unknown.  Social History    Social History   Socioeconomic History   Marital status: Single    Spouse name: Not on file   Number of children: 2   Years of education: Not on file   Highest education level: Not on file  Occupational History   Not on file  Tobacco Use   Smoking status: Former    Packs/day: 0.50    Years: 29.00    Total pack years: 14.50    Types: Cigarettes    Quit date: 01/04/2019    Years since quitting: 2.8   Smokeless tobacco: Never   Tobacco comments:    desires patch  Vaping Use   Vaping Use: Never used  Substance and Sexual Activity   Alcohol use: Not Currently    Comment: a pint a week   Drug use: Never   Sexual activity: Not on file  Other Topics Concern   Not on file  Social History Narrative   Lives with daughter.  Two children.  One grand.     Social Determinants of Health   Financial Resource Strain: Not on file  Food Insecurity: Not on file  Transportation Needs: Not on file  Physical Activity: Not on file  Stress: Not on file  Social Connections:  Not on file  Intimate Partner Violence: Not on file     Review of Systems    General:  No chills, fever, night sweats or weight changes.  Generalized fatigue Cardiovascular:  No chest pain, dyspnea on exertion, edema, orthopnea, palpitations, paroxysmal nocturnal dyspnea. Dermatological: No rash, lesions/masses Respiratory: No cough, dyspnea Urologic: No hematuria, dysuria Abdominal:   No nausea, vomiting, diarrhea, bright red blood per rectum, melena, or hematemesis Neurologic:  No visual changes, wkns, changes in mental status. All other systems reviewed and are otherwise negative except as noted above.     Physical Exam    VS:  BP 124/68  Pulse 67   Ht 5' 7.5" (1.715 m)   Wt 248 lb 9.6 oz (112.8 kg)   SpO2 98%   BMI 38.36 kg/m  , BMI Body mass index is 38.36 kg/m.     GEN: Well nourished, well developed, in no acute distress. HEENT: normal. Neck: Supple, no JVD, soft bilateral carotid bruits, or masses. Cardiac: RRR, (irregular with frequent extrasystoles when lying flat) 1 2/6 systolic murmurs, rubs, or gallops. No clubbing, cyanosis, edema.  Radials/DP/PT 2+ and equal bilaterally.  Respiratory:  Respirations regular and unlabored, clear to auscultation bilaterally. GI: Soft, nontender, nondistended, BS + x 4. MS: no deformity or atrophy. Skin: warm and dry, no rash. Neuro:  Strength and sensation are intact. Psych: Normal affect.  Accessory Clinical Findings    ECG personally reviewed by me today-normal sinus rhythm, PR interval 144 ms, septal Q waves- No acute changes  Lab Results  Component Value Date   WBC 7.4 11/03/2021   HGB 12.5 11/03/2021   HCT 39.0 11/03/2021   MCV 105.1 (H) 11/03/2021   PLT 144 (L) 11/03/2021   Lab Results  Component Value Date   CREATININE 0.92 11/04/2021   BUN 20 11/04/2021   NA 137 11/04/2021   K 4.4 11/04/2021   CL 112 (H) 11/04/2021   CO2 22 11/04/2021   Lab Results  Component Value Date   ALT 7 11/03/2021   AST 15  11/03/2021   ALKPHOS 71 11/03/2021   BILITOT 0.4 11/03/2021   Lab Results  Component Value Date   CHOL 158 05/07/2021   HDL 59.60 05/07/2021   LDLCALC 73 05/07/2021   TRIG 126.0 05/07/2021   CHOLHDL 3 05/07/2021    Lab Results  Component Value Date   HGBA1C 5.5 05/07/2021    Review of Prior Studies: Echocardiogram 11/04/2021 1. Left ventricular ejection fraction, by estimation, is 60 to 65%. The  left ventricle has normal function. The left ventricle has no regional  wall motion abnormalities. Left ventricular diastolic parameters are  consistent with Grade I diastolic  dysfunction (impaired relaxation).   2. Right ventricular systolic function is normal. The right ventricular  size is mildly enlarged. There is normal pulmonary artery systolic  pressure. The estimated right ventricular systolic pressure is 82.9 mmHg.   3. Left atrial size was moderately dilated.   4. Right atrial size was mildly dilated.   5. The mitral valve is normal in structure. Trivial mitral valve  regurgitation. No evidence of mitral stenosis.   6. The aortic valve is tricuspid. Aortic valve regurgitation is not  visualized. No aortic stenosis is present.   7. The inferior vena cava is normal in size with greater than 50%  respiratory variability, suggesting right atrial pressure of 3 mmHg.   Assessment & Plan   1.  Complete heart block: Resolved after stopping metoprolol during recent hospitalization.  Continues to have some mild fatigue.  Denies any rapid heart rhythms or palpitations.  I did notice that her heart rate became irregular with frequent extrasystoles when lying supine.  We will need to follow this if this becomes more of an issue while she is standing and active.  She is to report any new symptoms.  2.  Hypertension: Currently well controlled.  No changes on current medication regimen, continue amlodipine, hydralazine, HCTZ, isosorbide, lisinopril.  She has frequent labs completed by  oncology and therefore will not repeat.  Most recent creatinine is 0.92 on 11/04/2021.  3.  Adenocarcinoma of the colon: Followed by oncology.  She denies any abdominal pain or GI symptoms at this time.    Current medicines are reviewed at length with the patient today.  I have spent 25 min's  dedicated to the care of this patient on the date of this encounter to include pre-visit review of records, assessment, management and diagnostic testing,with shared decision making.  Signed, Phill Myron. West Pugh, ANP, AACC   11/20/2021 9:42 AM      Office [phone] Fax [phone]  Notice: This dictation was prepared with Dragon dictation along with smaller phrase technology. Any transcriptional errors that result from this process are unintentional and may not be corrected upon review.

## 2021-11-20 ENCOUNTER — Ambulatory Visit (INDEPENDENT_AMBULATORY_CARE_PROVIDER_SITE_OTHER): Payer: Medicare Other | Admitting: Adult Health

## 2021-11-20 ENCOUNTER — Encounter: Payer: Self-pay | Admitting: Adult Health

## 2021-11-20 VITALS — BP 124/68 | HR 67 | Ht 67.5 in | Wt 248.6 lb

## 2021-11-20 DIAGNOSIS — I495 Sick sinus syndrome: Secondary | ICD-10-CM

## 2021-11-20 DIAGNOSIS — C189 Malignant neoplasm of colon, unspecified: Secondary | ICD-10-CM

## 2021-11-20 DIAGNOSIS — I10 Essential (primary) hypertension: Secondary | ICD-10-CM

## 2021-11-20 DIAGNOSIS — F17200 Nicotine dependence, unspecified, uncomplicated: Secondary | ICD-10-CM | POA: Diagnosis not present

## 2021-11-20 DIAGNOSIS — I442 Atrioventricular block, complete: Secondary | ICD-10-CM

## 2021-11-20 DIAGNOSIS — C787 Secondary malignant neoplasm of liver and intrahepatic bile duct: Secondary | ICD-10-CM

## 2021-11-20 NOTE — Patient Instructions (Signed)
Medication Instructions:  No Changes *If you need a refill on your cardiac medications before your next appointment, please call your pharmacy*   Lab Work: No labs If you have labs (blood work) drawn today and your tests are completely normal, you will receive your results only by: [place] (if you have MyChart) OR A paper copy in the mail If you have any lab test that is abnormal or we need to change your treatment, we will call you to review the results.   Testing/Procedures: No Testing   Follow-Up: At [facility], you and your health needs are our priority.  As part of our continuing mission to provide you with exceptional heart care, we have created designated Provider Care Teams.  These Care Teams include your primary Cardiologist (physician) and Advanced Practice Providers (APPs -  Physician Assistants and Nurse Practitioners) who all work together to provide you with the care you need, when you need it.  We recommend signing up for the patient portal called "MyChart".  Sign up information is provided on this After Visit Summary.  MyChart is used to connect with patients for Virtual Visits (Telemedicine).  Patients are able to view lab/test results, encounter notes, upcoming appointments, etc.  Non-urgent messages can be sent to your provider as well.   To learn more about what you can do with MyChart, go to [email or web address].    Your next appointment:   6 month(s)  The format for your next appointment:   In Person  Provider:   Minus Breeding, MD

## 2021-11-24 ENCOUNTER — Inpatient Hospital Stay (HOSPITAL_BASED_OUTPATIENT_CLINIC_OR_DEPARTMENT_OTHER): Payer: Medicare Other | Admitting: Hematology

## 2021-11-24 ENCOUNTER — Other Ambulatory Visit: Payer: Self-pay

## 2021-11-24 ENCOUNTER — Inpatient Hospital Stay: Payer: Medicare Other

## 2021-11-24 ENCOUNTER — Encounter: Payer: Self-pay | Admitting: Hematology

## 2021-11-24 VITALS — BP 159/69 | HR 77 | Temp 97.6°F | Resp 18 | Ht 67.5 in | Wt 247.5 lb

## 2021-11-24 DIAGNOSIS — Z7189 Other specified counseling: Secondary | ICD-10-CM

## 2021-11-24 DIAGNOSIS — C787 Secondary malignant neoplasm of liver and intrahepatic bile duct: Secondary | ICD-10-CM

## 2021-11-24 DIAGNOSIS — Z5112 Encounter for antineoplastic immunotherapy: Secondary | ICD-10-CM | POA: Diagnosis not present

## 2021-11-24 DIAGNOSIS — C189 Malignant neoplasm of colon, unspecified: Secondary | ICD-10-CM

## 2021-11-24 DIAGNOSIS — C184 Malignant neoplasm of transverse colon: Secondary | ICD-10-CM | POA: Insufficient documentation

## 2021-11-24 DIAGNOSIS — Z95828 Presence of other vascular implants and grafts: Secondary | ICD-10-CM

## 2021-11-24 DIAGNOSIS — Z79899 Other long term (current) drug therapy: Secondary | ICD-10-CM | POA: Insufficient documentation

## 2021-11-24 LAB — COMPREHENSIVE METABOLIC PANEL
ALT: 9 U/L (ref 0–44)
AST: 17 U/L (ref 15–41)
Albumin: 4.1 g/dL (ref 3.5–5.0)
Alkaline Phosphatase: 85 U/L (ref 38–126)
Anion gap: 4 — ABNORMAL LOW (ref 5–15)
BUN: 21 mg/dL (ref 8–23)
CO2: 23 mmol/L (ref 22–32)
Calcium: 10.1 mg/dL (ref 8.9–10.3)
Chloride: 108 mmol/L (ref 98–111)
Creatinine, Ser: 1.01 mg/dL — ABNORMAL HIGH (ref 0.44–1.00)
GFR, Estimated: >60 mL/min (ref >60)
Glucose, Bld: 84 mg/dL (ref 70–99)
Potassium: 4.5 mmol/L (ref 3.5–5.1)
Sodium: 135 mmol/L (ref 135–145)
Total Bilirubin: 0.5 mg/dL (ref 0.3–1.2)
Total Protein: 7.9 g/dL (ref 6.5–8.1)

## 2021-11-24 LAB — CBC WITH DIFFERENTIAL/PLATELET
Abs Immature Granulocytes: 0.03 10*3/uL (ref 0.00–0.07)
Basophils Absolute: 0 10*3/uL (ref 0.0–0.1)
Basophils Relative: 0 %
Eosinophils Absolute: 0.2 10*3/uL (ref 0.0–0.5)
Eosinophils Relative: 3 %
HCT: 35.6 % — ABNORMAL LOW (ref 36.0–46.0)
Hemoglobin: 11.9 g/dL — ABNORMAL LOW (ref 12.0–15.0)
Immature Granulocytes: 0 %
Lymphocytes Relative: 30 %
Lymphs Abs: 2.1 10*3/uL (ref 0.7–4.0)
MCH: 33.2 pg (ref 26.0–34.0)
MCHC: 33.4 g/dL (ref 30.0–36.0)
MCV: 99.4 fL (ref 80.0–100.0)
Monocytes Absolute: 0.7 10*3/uL (ref 0.1–1.0)
Monocytes Relative: 10 %
Neutro Abs: 4 10*3/uL (ref 1.7–7.7)
Neutrophils Relative %: 57 %
Platelets: 192 10*3/uL (ref 150–400)
RBC: 3.58 MIL/uL — ABNORMAL LOW (ref 3.87–5.11)
RDW: 17.2 % — ABNORMAL HIGH (ref 11.5–15.5)
WBC: 7 10*3/uL (ref 4.0–10.5)
nRBC: 0 % (ref 0.0–0.2)

## 2021-11-24 LAB — TOTAL PROTEIN, URINE DIPSTICK: Protein, ur: NEGATIVE mg/dL

## 2021-11-24 LAB — CEA (IN HOUSE-CHCC): CEA (CHCC-In House): 262.73 ng/mL — ABNORMAL HIGH (ref 0.00–5.00)

## 2021-11-24 MED ORDER — SODIUM CHLORIDE 0.9% FLUSH
10.0000 mL | INTRAVENOUS | Status: DC | PRN
  Administered 2021-11-24: 10 mL

## 2021-11-24 MED ORDER — HEPARIN SOD (PORK) LOCK FLUSH 100 UNIT/ML IV SOLN
500.0000 [IU] | Freq: Once | INTRAVENOUS | Status: AC | PRN
  Administered 2021-11-24: 500 [IU]

## 2021-11-24 MED ORDER — SODIUM CHLORIDE 0.9 % IV SOLN
7.5000 mg/kg | Freq: Once | INTRAVENOUS | Status: AC
  Administered 2021-11-24: 800 mg via INTRAVENOUS
  Filled 2021-11-24: qty 32

## 2021-11-24 MED ORDER — SODIUM CHLORIDE 0.9 % IV SOLN: Freq: Once | INTRAVENOUS | Status: AC

## 2021-11-24 NOTE — Progress Notes (Signed)
[facility]   [phone] [phone]   Clinic Follow up Note   Patient Care Team: [name], [name], MD as PCP - General (Internal Medicine) Minus Breeding, MD as PCP - Cardiology (Cardiology) Clovis Riley, MD as Consulting Physician (General Surgery) Truitt Merle, MD as Consulting Physician (Hematology) [name], NP as Nurse Practitioner (Nurse Practitioner)  Date of Service:  11/24/2021  CHIEF COMPLAINT: f/u of metastatic colon cancer  CURRENT THERAPY:  Maintenance Avastin and Xeloda, q3weeks, starting 01/21/20             -Xeloda dose: 2046m q12h on day 1-14  ASSESSMENT & PLAN:  [name] a 61y.o. female with   1. 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 -PET from 02/26/19 shows known liver metastasis and metastatic lymphadenopathy in chest and right axilla. -[name] shows MSI stable disease, Kras+ No targetable mutation, she is not a candidate for EGFR or immunotherapy. -Began palliative first line chemo on 03/12/2019, received 5FU/leuc with first 2 cycles for large open abdominal wound after surgery. She started full dose FOLFOX and avastin with cycle 2. Due to neuropathy Oxaliplatin was stopped after 09/24/19. -For convenience she was switched to oral chemo Xeloda 2 weeks on/1 week off and Bev every 3 weeks in 01/21/20. -restaging PET 09/14/21 showed: no significant change to hypermetabolic liver metastases and thoracic lymphadenopathy. -She is clinically doing well aside from some mildly worsening fatigue and neuropathy. Labs reviewed, overall stable. Adequate to continue Xeloda and bevacizumab maintenance therapy. We will consider dose reduction for next cycle if her symptoms persist.    2. Chemotherapy induced peripheral neuropathy (CIPN), G1 -secondary to Oxaliplatin, developed after cycle 12 FOLFOX. Oxaliplatin d/c after C15 on 09/24/19 -Neuropathy in fingers and feet are moderate and stable with mild decreased function in fingers.  -She is currently on gabapentin 100 mg AM and 300 mg qHS and titrate up if needed.    3. 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 Will give IV iron if needed  -mild and stable, hgb 11.9 today (11/24/21)   4. HTN, uncontrolled  -on hydralazine, isosorbide, lisinopril, amlodipine and HCTZ  -Will monitor on bevacizumab.    6.  Bradycardia and high grade AV block -She has been bradycardic lately, heart rate of 41 on 11/03/21. I sent her to ED. -she was evaluated and felt to not need a pacemaker. Her metoprolol was stopped, and her HR has risen to WNL.    PLAN: -proceed with bevacizumab today  -continue Xeloda at same dose  -lab, flush, f/u, and beva every 3 weeks -restaging CT to be done in 5-6 weeks   No problem-specific Assessment & Plan notes found for this encounter.   SUMMARY OF ONCOLOGIC HISTORY: Oncology History Overview Note  Cancer Staging Adenocarcinoma of colon metastatic to liver ([facility] Staging form: Colon and Rectum, AJCC 8th Edition - Pathologic stage from 01/24/2019: Stage IVA (pT4a, pN1a, pM1a) - Signed by BAlla Feeling NP on 02/14/2019    Adenocarcinoma of colon metastatic to liver ([facility]  01/23/2019 Imaging   ABD Xray IMPRESSION: 1. Bowel-gas pattern consistent with small bowel obstruction. No free air. 2. No acute chest findings.   01/23/2019 Imaging  CT AP IMPRESSION: Obstructing mid transverse colonic mass with mild regional adenopathy and hepatic metastatic disease. The mass likely extends through the serosa; no ascites or peritoneal nodularity.   01/24/2019 Surgery   Surgeon: Chelsea A Connor MD Assistant:  Jessica Focht PA-C Procedure performed: Transverse colectomy with end colostomy, liver biopsy Procedure classification: URGENT/EMERGENT Preop diagnosis: Obstructing, metastatic transverse colon mass Post-op diagnosis/intraop findings: Same   01/24/2019 Pathology Results   FINAL MICROSCOPIC DIAGNOSIS:   A. COLON, TRANSVERSE, RESECTION:  Colonic adenocarcinoma, 5 cm.  Carcinoma extends into pericolonic connective tissue and focally to  serosal surface.  Margins not involved.  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   B. LIVER NODULE, LEFT, BIOPSY:  Metastatic adenocarcinoma.    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 Hypermetabolic metastatic disease in the liver and mediastinal/hilar/axillary lymph nodes. 2. Focal hypermetabolism in the rectum. Continued attention on follow-up exams is warranted. 3. Focal hypermetabolism medial to the right adrenal gland may be within a metastatic lymph node, better visualized on 01/23/2019. 4. Aortic atherosclerosis (ICD10-170.0). Coronary artery calcification.   03/12/2019 -  Chemotherapy   She started 5FU q2weeks on 03/12/19 for 2 cycles. She started full dose FOLFOX with Avastin on 04/09/19. Oxaliplatin dose reduced repeatedly due to neuropathy C12 and held since C16 on 10/09/19. Now on maintenance Avastin and 5FU q2weeks since 10/09/19       -Maintenance change to maintenance xeloda 2000 mg BID days 1-14 q21 days and q3 weeks Zirabev (15 mg/kg) starting 01/21/20. First cycle was taken 1000mg BID due to misunderstanding.    05/31/2019 Imaging   Restaging CT CAP IMPRESSION: 1. Similar to mild interval decrease in size of multiple hepatic lesions, partially  calcified. 2. 2 mm right upper lobe pulmonary nodule. Recommend attention on follow-up. 3. Emphysema and aortic atherosclerosis.   08/23/2019 Imaging   CT CAP w contrast  IMPRESSION: 1. The dominant peripheral right liver metastasis has mildly increased. Other smaller liver metastases are stable. 2. Otherwise no new or progressive metastatic disease in the chest, abdomen or pelvis. 3. Aortic Atherosclerosis (ICD10-I70.0) and Emphysema (ICD10-J43.9).   11/27/2019 Imaging   CT CAP w contrast  IMPRESSION: Status post transverse colectomy with right mid abdominal colostomy.   Mildly progressive hepatic metastases, as above.   No evidence of metastatic disease in the chest. Small mediastinal lymph nodes are within normal limits.   Additional stable ancillary findings as above.   01/21/2020 -  Chemotherapy   Patient is on Treatment Plan : COLORECTAL Bevacizumab q21d     02/21/2020 Imaging   IMPRESSION: 1. Stable hepatic metastatic disease. 2. Aortic atherosclerosis (ICD10-I70.0). Coronary artery calcification. 3.  Emphysema (ICD10-J43.9).   05/19/2020 Imaging   CT CAP  IMPRESSION: 1. Multiple partially calcified liver metastases are again noted. With the exception of a small lesion in segment 7/8 lesions are not significantly changed in the interval. No new liver lesions identified. 2. Coronary artery atherosclerotic calcifications. 3. Aortic atherosclerosis. 4. 2 mm right upper lobe lung nodule identified.  Unchanged.   Aortic Atherosclerosis (ICD10-I70.0).   11/17/2020 Imaging   CT CAP  IMPRESSION: 1. Partially calcified lesions throughout the liver are stable accounting for differences in technique, contrasted imaging on today's   study is compared to noncontrast imaging on the prior. 2. No new hepatic lesions. 3. Tiny 3 mm pulmonary nodule in the RIGHT upper lobe unchanged since the prior study. Attention on follow-up. 4. Mild fullness of RIGHT paratracheal nodal  tissue is minimally increased and borderline enlarged, attention on follow-up. 5. RIGHT lower quadrant colostomy. 6. 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 7. Aortic atherosclerosis.   06/09/2021 Imaging   EXAM: CT CHEST, ABDOMEN, AND PELVIS WITH CONTRAST  IMPRESSION: Stable calcified liver metastases.   Stable mildly enlarged right paratracheal lymph node.   No evidence of new or progressive metastatic disease.   Aortic Atherosclerosis (ICD10-I70.0).      INTERVAL HISTORY:  Casey Black is here for a follow up of metastatic colon cancer. She was last seen by me on 11/03/21. She presents to the clinic alone. She reports she is doing well overall. She notes she continues to do well with treatment. She notes maybe some mild worsening of tingling in her hands and feet, still mild and not affecting her function. She also reports fatigue, specifically muscular fatigue in her upper legs and back. She notes this also seems to be somewhat worse.   All other systems were reviewed with the patient and are negative.  MEDICAL HISTORY:  Past Medical History:  Diagnosis Date   Anemia    low iron   Colon cancer (HCC) 01/2019   Hypertension 01/23/2019   Personal history of chemotherapy 01/2019   colon CA   SBO (small bowel obstruction) (HCC) 01/23/2019    SURGICAL HISTORY: Past Surgical History:  Procedure Laterality Date   CESAREAN SECTION     x2   COLOSTOMY N/A 01/24/2019   Procedure: End Loop Colostomy;  Surgeon: Connor, Chelsea A, MD;  Location: [facility] OR;  Service: General;  Laterality: N/A;   PARTIAL COLECTOMY N/A 01/24/2019   Procedure: PARTIAL COLECTOMY;  Surgeon: Connor, Chelsea A, MD;  Location: [facility] OR;  Service: General;  Laterality: N/A;   PORTACATH PLACEMENT Right 02/28/2019   Procedure: INSERTION PORT-A-CATH WITH ULTRASOUND GUIDANCE;  Surgeon: Connor,  Chelsea A, MD;  Location: [facility] OR;  Service: General;  Laterality: Right;    I have reviewed the social history and family history with the patient and they are unchanged from previous note.  ALLERGIES:  has No Known Allergies.  MEDICATIONS:  Current Outpatient Medications  Medication Sig Dispense Refill   acetaminophen (TYLENOL) 325 MG tablet Take 2 tablets (650 mg total) by mouth every 6 (six) hours as needed.     amLODipine (NORVASC) 10 MG tablet TAKE 1 TABLET BY MOUTH EVERY DAY 90 tablet 1   benzonatate (TESSALON) 100 MG capsule Take 1 capsule (100 mg total) by mouth every 8 (eight) hours. 21 capsule 0   capecitabine (XELODA) 500 MG tablet Take 4 tablets (2,000 mg total) by mouth 2 (two) times daily after a meal. Take for 14 days, then off for 7 days. 112 tablet 1   ferrous sulfate 325 (65 FE) MG tablet TAKE 1 TABLET BY MOUTH 2 TIMES DAILY WITH A MEAL. 180 tablet 1   gabapentin (NEURONTIN) 300 MG capsule TAKE 1 CAPSULE BY MOUTH TWICE A DAY 60 capsule 3   hydrALAZINE (APRESOLINE) 25 MG tablet TAKE 1 TABLET BY MOUTH EVERY 8 HOURS 90 tablet 0   hydrochlorothiazide (HYDRODIURIL) 12.5 MG tablet TAKE 1 TABLET BY MOUTH EVERY DAY 90 tablet 0     hydrocortisone (ANUSOL-HC) 2.5 % rectal cream Place 1 application rectally 2 (two) times daily. 30 g 0   Ibuprofen 200 MG CAPS Take 400 mg by mouth daily as needed (pain).     isosorbide mononitrate (IMDUR) 30 MG 24 hr tablet TAKE 1 TABLET BY MOUTH EVERY DAY 90 tablet 1   lidocaine (LMX) 4 % cream Apply 1 application topically 3 (three) times daily as needed. 30 g 0   lidocaine-prilocaine (EMLA) cream Apply 1 application topically as needed. 30 g 1   lisinopril (ZESTRIL) 40 MG tablet Take 1 tablet (40 mg total) by mouth daily. 90 tablet 1   polycarbophil (FIBERCON) 625 MG tablet Take 1 tablet (625 mg total) by mouth daily. 30 tablet 0   Vitamin D, Ergocalciferol, (DRISDOL) 1.25 MG (50000 UNIT) CAPS capsule Take 1 capsule (50,000 Units total) by mouth every 7  (seven) days for 12 doses. 12 capsule 0   No current facility-administered medications for this visit.   Facility-Administered Medications Ordered in Other Visits  Medication Dose Route Frequency Provider Last Rate Last Admin   sodium chloride flush (NS) 0.9 % injection 10 mL  10 mL Intracatheter PRN Feng, Yan, MD   10 mL at 06/06/20 1051    PHYSICAL EXAMINATION: ECOG PERFORMANCE STATUS: 1 - Symptomatic but completely ambulatory  Vitals:   11/24/21 1220  BP: (!) 159/69  Pulse: 77  Resp: 18  Temp: 97.6 F (36.4 C)  SpO2: 99%   Wt Readings from Last 3 Encounters:  11/24/21 247 lb 8 oz (112.3 kg)  11/20/21 248 lb 9.6 oz (112.8 kg)  11/03/21 246 lb 12.8 oz (111.9 kg)     GENERAL:alert, no distress and comfortable SKIN: skin color normal, no rashes or significant lesions EYES: normal, Conjunctiva are pink and non-injected, sclera clear  NEURO: alert & oriented x 3 with fluent speech  LABORATORY DATA:  I have reviewed the data as listed    Latest Ref Rng & Units 11/24/2021   11:59 AM 11/03/2021    4:47 PM 11/03/2021   11:59 AM  CBC  WBC 4.0 - 10.5 K/uL 7.0  7.4  7.1   Hemoglobin 12.0 - 15.0 g/dL 11.9  12.5  11.7   Hematocrit 36.0 - 46.0 % 35.6  39.0  35.1   Platelets 150 - 400 K/uL 192  144  134         Latest Ref Rng & Units 11/24/2021   11:59 AM 11/04/2021    5:37 AM 11/03/2021    4:47 PM  CMP  Glucose 70 - 99 mg/dL 84  93  83   BUN 8 - 23 mg/dL 21  20  22   Creatinine 0.44 - 1.00 mg/dL 1.01  0.92  1.15   Sodium 135 - 145 mmol/L 135  137  135   Potassium 3.5 - 5.1 mmol/L 4.5  4.4  4.6   Chloride 98 - 111 mmol/L 108  112  111   CO2 22 - 32 mmol/L 23  22  20   Calcium 8.9 - 10.3 mg/dL 10.1  9.2  9.5   Total Protein 6.5 - 8.1 g/dL 7.9     Total Bilirubin 0.3 - 1.2 mg/dL 0.5     Alkaline Phos 38 - 126 U/L 85     AST 15 - 41 U/L 17     ALT 0 - 44 U/L 9         RADIOGRAPHIC STUDIES: I have personally reviewed the radiological images as listed and   agreed with the  findings in the report. No results found.    Orders Placed This Encounter  Procedures   CT CHEST ABDOMEN PELVIS W CONTRAST    Standing Status:   Future    Standing Expiration Date:   11/25/2022    Order Specific Question:   Preferred imaging location?    Answer:   [facility]    Order Specific Question:   Is Oral Contrast requested for this exam?    Answer:   Yes, Per Radiology protocol   CBC with Differential (Cancer Center Only)    Standing Status:   Future    Standing Expiration Date:   11/25/2022   Total Protein, Urine dipstick    Standing Status:   Future    Standing Expiration Date:   11/25/2022   CBC with Differential (Cancer Center Only)    Standing Status:   Future    Standing Expiration Date:   12/16/2022   CBC with Differential (Cancer Center Only)    Standing Status:   Future    Standing Expiration Date:   01/06/2023   All questions were answered. The patient knows to call the clinic with any problems, questions or concerns. No barriers to learning was detected. The total time spent in the appointment was 30 minutes.     Yan Feng, MD 11/24/2021   I, Katie Daubenspeck, am acting as scribe for Yan Feng, MD.   I have reviewed the above documentation for accuracy and completeness, and I agree with the above.     

## 2021-11-24 NOTE — Patient Instructions (Signed)
[facility] MEDICAL [facility]COLOG[facility]  Discharge Instructions: °Thank you for choosing [facility] to provide your oncology and hematology care.  ° °If you have a lab appointment with the Cancer Center, please go directly to the Cancer Center and check in at the registration area. °  °Wear comfortable clothing and clothing appropriate for easy access to any Portacath or PICC line.  ° °We strive to give you quality time with your provider. You may need to reschedule your appointment if you arrive late (15 or more minutes).  Arriving late affects you and other patients whose appointments are after yours.  Also, if you miss three or more appointments without notifying the office, you may be dismissed from the clinic at the provider’s discretion.    °  °For prescription refill requests, have your pharmacy contact our office and allow 72 hours for refills to be completed.   ° °Today you received the following chemotherapy and/or immunotherapy agents: Bevacizumab.     °  °To help prevent nausea and vomiting after your treatment, we encourage you to take your nausea medication as directed. ° °BELOW ARE SYMPTOMS THAT SHOULD BE REPORTED IMMEDIATELY: °*FEVER GREATER THAN 100.4 F (38 °C) OR HIGHER °*CHILLS OR SWEATING °*NAUSEA AND VOMITING THAT IS NOT CONTROLLED WITH YOUR NAUSEA MEDICATION °*UNUSUAL SHORTNESS OF BREATH °*UNUSUAL BRUISING OR BLEEDING °*URINARY PROBLEMS (pain or burning when urinating, or frequent urination) °*BOWEL PROBLEMS (unusual diarrhea, constipation, pain near the anus) °TENDERNESS IN MOUTH AND THROAT WITH OR WITHOUT PRESENCE OF ULCERS (sore throat, sores in mouth, or a toothache) °UNUSUAL RASH, SWELLING OR PAIN  °UNUSUAL VAGINAL DISCHARGE OR ITCHING  ° °Items with * indicate a potential emergency and should be followed up as soon as possible or go to the Emergency Department if any problems should occur. ° °Please show the CHEMOTHERAPY ALERT CARD or IMMUNOTHERAPY ALERT CARD at check-in  to the Emergency Department and triage nurse. ° °Should you have questions after your visit or need to cancel or reschedule your appointment, please contact [facility]  Dept: [phone]  and follow the prompts.  Office hours are 8:00 a.m. to 4:30 p.m. Monday - Friday. Please note that voicemails left after 4:00 p.m. may not be returned until the following business day.  We are closed weekends and major holidays. You have access to a nurse at all times for urgent questions. Please call the main number to the clinic Dept: [phone] and follow the prompts. ° ° °For any non-urgent questions, you may also contact your provider using MyChart. We now offer e-Visits for anyone 18 and older to request care online for non-urgent symptoms. For details visit [email or web address]. °  °Also download the MyChart app! Go to the app store, search "MyChart", open the app, select [facility], and log in with your MyChart username and password. ° °Due to Covid, a mask is required upon entering the hospital/clinic. If you do not have a mask, one will be given to you upon arrival. For doctor visits, patients may have 1 support Rolly Magri aged 18 or older with them. For treatment visits, patients cannot have anyone with them due to current Covid guidelines and our immunocompromised population.  ° °

## 2021-11-30 ENCOUNTER — Other Ambulatory Visit: Payer: Self-pay | Admitting: Internal Medicine

## 2021-12-01 ENCOUNTER — Other Ambulatory Visit (HOSPITAL_COMMUNITY): Payer: Self-pay

## 2021-12-08 ENCOUNTER — Telehealth: Payer: Self-pay | Admitting: Hematology

## 2021-12-08 NOTE — Telephone Encounter (Signed)
Scheduled follow-up appointments per appointment request workqueue. Patient is aware. 

## 2021-12-13 ENCOUNTER — Other Ambulatory Visit: Payer: Self-pay | Admitting: Internal Medicine

## 2021-12-16 ENCOUNTER — Other Ambulatory Visit: Payer: Self-pay

## 2021-12-16 ENCOUNTER — Encounter: Payer: Self-pay | Admitting: Adult Health

## 2021-12-16 ENCOUNTER — Inpatient Hospital Stay: Payer: Medicare Other | Attending: Hematology

## 2021-12-16 ENCOUNTER — Inpatient Hospital Stay (HOSPITAL_BASED_OUTPATIENT_CLINIC_OR_DEPARTMENT_OTHER): Payer: Medicare Other

## 2021-12-16 ENCOUNTER — Inpatient Hospital Stay (HOSPITAL_BASED_OUTPATIENT_CLINIC_OR_DEPARTMENT_OTHER): Payer: Medicare Other | Admitting: Adult Health

## 2021-12-16 DIAGNOSIS — C787 Secondary malignant neoplasm of liver and intrahepatic bile duct: Secondary | ICD-10-CM

## 2021-12-16 DIAGNOSIS — C189 Malignant neoplasm of colon, unspecified: Secondary | ICD-10-CM

## 2021-12-16 DIAGNOSIS — Z7189 Other specified counseling: Secondary | ICD-10-CM

## 2021-12-16 DIAGNOSIS — Z5112 Encounter for antineoplastic immunotherapy: Secondary | ICD-10-CM | POA: Diagnosis present

## 2021-12-16 DIAGNOSIS — C184 Malignant neoplasm of transverse colon: Secondary | ICD-10-CM | POA: Insufficient documentation

## 2021-12-16 DIAGNOSIS — Z87891 Personal history of nicotine dependence: Secondary | ICD-10-CM | POA: Insufficient documentation

## 2021-12-16 DIAGNOSIS — Z95828 Presence of other vascular implants and grafts: Secondary | ICD-10-CM

## 2021-12-16 LAB — CBC WITH DIFFERENTIAL (CANCER CENTER ONLY)
Abs Immature Granulocytes: 0.02 10*3/uL (ref 0.00–0.07)
Basophils Absolute: 0 10*3/uL (ref 0.0–0.1)
Basophils Relative: 1 %
Eosinophils Absolute: 0.2 10*3/uL (ref 0.0–0.5)
Eosinophils Relative: 3 %
HCT: 34.2 % — ABNORMAL LOW (ref 36.0–46.0)
Hemoglobin: 11 g/dL — ABNORMAL LOW (ref 12.0–15.0)
Immature Granulocytes: 0 %
Lymphocytes Relative: 25 %
Lymphs Abs: 1.9 10*3/uL (ref 0.7–4.0)
MCH: 33.2 pg (ref 26.0–34.0)
MCHC: 32.2 g/dL (ref 30.0–36.0)
MCV: 103.3 fL — ABNORMAL HIGH (ref 80.0–100.0)
Monocytes Absolute: 0.7 10*3/uL (ref 0.1–1.0)
Monocytes Relative: 9 %
Neutro Abs: 4.6 10*3/uL (ref 1.7–7.7)
Neutrophils Relative %: 62 %
Platelet Count: 197 10*3/uL (ref 150–400)
RBC: 3.31 MIL/uL — ABNORMAL LOW (ref 3.87–5.11)
RDW: 17.6 % — ABNORMAL HIGH (ref 11.5–15.5)
WBC Count: 7.5 10*3/uL (ref 4.0–10.5)
nRBC: 0 % (ref 0.0–0.2)

## 2021-12-16 LAB — TOTAL PROTEIN, URINE DIPSTICK: Protein, ur: NEGATIVE mg/dL

## 2021-12-16 MED ORDER — HEPARIN SOD (PORK) LOCK FLUSH 100 UNIT/ML IV SOLN
500.0000 [IU] | Freq: Once | INTRAVENOUS | Status: AC | PRN
  Administered 2021-12-16: 500 [IU]

## 2021-12-16 MED ORDER — SODIUM CHLORIDE 0.9% FLUSH
10.0000 mL | INTRAVENOUS | Status: DC | PRN
  Administered 2021-12-16: 10 mL

## 2021-12-16 MED ORDER — GABAPENTIN 300 MG PO CAPS: 300.0000 mg | ORAL_CAPSULE | Freq: Three times a day (TID) | ORAL | 3 refills | Status: DC

## 2021-12-16 MED ORDER — SODIUM CHLORIDE 0.9 % IV SOLN: Freq: Once | INTRAVENOUS | Status: AC

## 2021-12-16 MED ORDER — SODIUM CHLORIDE 0.9 % IV SOLN
7.5000 mg/kg | Freq: Once | INTRAVENOUS | Status: AC
  Administered 2021-12-16: 800 mg via INTRAVENOUS
  Filled 2021-12-16: qty 32

## 2021-12-16 NOTE — Patient Instructions (Signed)

## 2021-12-16 NOTE — Patient Instructions (Signed)
[facility]COL[facility]  Discharge Instructions: °Thank you for choosing [facility] to provide your oncology and hematology care.  ° °If you have a lab appointment with the Cancer Center, please go directly to the Cancer Center and check in at the registration area. °  °Wear comfortable clothing and clothing appropriate for easy access to any Portacath or PICC line.  ° °We strive to give you quality time with your provider. You may need to reschedule your appointment if you arrive late (15 or more minutes).  Arriving late affects you and other patients whose appointments are after yours.  Also, if you miss three or more appointments without notifying the office, you may be dismissed from the clinic at the provider’s discretion.    °  °For prescription refill requests, have your pharmacy contact our office and allow 72 hours for refills to be completed.   ° °Today you received the following chemotherapy and/or immunotherapy agents: Bevacizumab.     °  °To help prevent nausea and vomiting after your treatment, we encourage you to take your nausea medication as directed. ° °BELOW ARE SYMPTOMS THAT SHOULD BE REPORTED IMMEDIATELY: °*FEVER GREATER THAN 100.4 F (38 °C) OR HIGHER °*CHILLS OR SWEATING °*NAUSEA AND VOMITING THAT IS NOT CONTROLLED WITH YOUR NAUSEA MEDICATION °*UNUSUAL SHORTNESS OF BREATH °*UNUSUAL BRUISING OR BLEEDING °*URINARY PROBLEMS (pain or burning when urinating, or frequent urination) °*BOWEL PROBLEMS (unusual diarrhea, constipation, pain near the anus) °TENDERNESS IN MOUTH AND THROAT WITH OR WITHOUT PRESENCE OF ULCERS (sore throat, sores in mouth, or a toothache) °UNUSUAL RASH, SWELLING OR PAIN  °UNUSUAL VAGINAL DISCHARGE OR ITCHING  ° °Items with * indicate a potential emergency and should be followed up as soon as possible or go to the Emergency Department if any problems should occur. ° °Please show the CHEMOTHERAPY ALERT CARD or IMMUNOTHERAPY ALERT CARD at check-in  to the Emergency Department and triage nurse. ° °Should you have questions after your visit or need to cancel or reschedule your appointment, please contact [facility]  Dept: [phone]  and follow the prompts.  Office hours are 8:00 a.m. to 4:30 p.m. Monday - Friday. Please note that voicemails left after 4:00 p.m. may not be returned until the following business day.  We are closed weekends and major holidays. You have access to a nurse at all times for urgent questions. Please call the main number to the clinic Dept: [phone] and follow the prompts. ° ° °For any non-urgent questions, you may also contact your provider using MyChart. We now offer e-Visits for anyone 18 and older to request care online for non-urgent symptoms. For details visit [email or web address]. °  °Also download the MyChart app! Go to the app store, search "MyChart", open the app, select [facility], and log in with your MyChart username and password. ° °Due to Covid, a mask is required upon entering the hospital/clinic. If you do not have a mask, one will be given to you upon arrival. For doctor visits, patients may have 1 support Casey Black aged 18 or older with them. For treatment visits, patients cannot have anyone with them due to current Covid guidelines and our immunocompromised population.  ° °

## 2021-12-16 NOTE — Progress Notes (Signed)
Called central scheduling to make appt for pt to receive CT CAP. Pt will go 12/24/21 at 3 PM to [facility] for port access with labs, then to CT at 4 PM. Pt is aware she will need to be NPO at 12 PM, drink one bottle of PO contrast at 2 PM and the second bottle at 3 PM. She will come to [facility] to pick up contrast.

## 2021-12-16 NOTE — Assessment & Plan Note (Signed)
Casey Black is a 61 year old woman with metastatic colon cancer here today for follow-up and treatment with bevacizumab and capecitabine.  She has no clinical signs of metastatic cancer progression.  She will continue on her treatment which she is tolerating well.  Her next restaging CT scan is due January 01, 2022 and this has not yet been scheduled.  I have sent my nurse a message to ask her to help ensure that it is authorized so scheduling can go ahead and happen.  I increased her gabapentin to 300 mg 3 times a day and sent it to [facility] on [address].  She will let me know if she has any issues with this.  I gave her some additional Aveda lotion today for her hands and feet.  I recommended small short walks to help improve her fatigue.  We will see her back in 3 weeks for labs, follow-up, and her next treatment.  At this visit she should have her CT chest abdomen pelvis completed so that we can review those results with her.

## 2021-12-16 NOTE — Progress Notes (Signed)
[facility]:    Casey Black, Casey Halsted, MD [place] [address]   DIAGNOSIS:  Cancer Staging  Adenocarcinoma of colon metastatic to liver [facility]) Staging form: Colon and Rectum, AJCC 8th Edition - Pathologic stage from 01/24/2019: Stage IVA (pT4a, pN1a, pM1a) - Signed by [name], NP on 02/14/2019 Total positive nodes: 1 Histologic grading system: 4 grade system Histologic grade (G): G2 Lymph-vascular invasion (LVI): LVI present/identified, NOS Tumor deposits (TD): Absent Perineural invasion (PNI): Absent   SUMMARY OF ONCOLOGIC HISTORY: Oncology History Overview Note  Cancer Staging 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 Bowel-gas pattern consistent with small bowel obstruction. No free air. 2. No acute chest findings.   01/23/2019 Imaging   CT AP IMPRESSION: Obstructing mid transverse colonic mass with mild regional adenopathy and hepatic metastatic disease. The mass likely extends through the serosa; no ascites or peritoneal nodularity.   01/24/2019 Surgery   Surgeon: Clovis Riley MD Assistant: [name] Procedure performed: Transverse colectomy with end colostomy, liver biopsy Procedure classification: URGENT/EMERGENT Preop diagnosis: Obstructing, metastatic transverse colon mass Post-op diagnosis/intraop findings: Same   01/24/2019 Pathology Results   FINAL MICROSCOPIC DIAGNOSIS:   A. COLON, TRANSVERSE, RESECTION:  Colonic adenocarcinoma, 5 cm.  Carcinoma extends into pericolonic connective tissue and focally to  serosal surface.  Margins not involved.  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    B. LIVER NODULE, LEFT, BIOPSY:  Metastatic adenocarcinoma.    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 Hypermetabolic metastatic disease in the liver and mediastinal/hilar/axillary lymph nodes. 2. Focal hypermetabolism in the rectum. Continued attention on follow-up exams is warranted. 3. Focal hypermetabolism medial to the right adrenal gland may be within a metastatic lymph node, better visualized on 01/23/2019. 4. Aortic atherosclerosis (ICD10-170.0). Coronary artery calcification.   03/12/2019 -  Chemotherapy   She started 5FU q2weeks on 03/12/19 for 2 cycles. She started full dose FOLFOX with Avastin on 04/09/19. Oxaliplatin dose reduced repeatedly due to neuropathy C12 and held since C16 on 10/09/19. Now on maintenance Avastin and 5FU q2weeks since 10/09/19       -Maintenance change to maintenance xeloda 2000 mg BID days 1-14 q21 days and q3 weeks Zirabev (15 mg/kg) starting 01/21/20. First cycle was taken '1000mg'$  BID due to misunderstanding.    05/31/2019 Imaging   Restaging CT CAP IMPRESSION: 1. Similar to mild interval decrease in size of multiple hepatic lesions, partially calcified. 2. 2 mm right upper lobe pulmonary nodule. Recommend attention on follow-up. 3. Emphysema and aortic atherosclerosis.   08/23/2019 Imaging   CT CAP w contrast  IMPRESSION: 1. The dominant peripheral right liver metastasis has mildly increased. Other smaller liver metastases are stable. 2. Otherwise no new or progressive metastatic disease in the chest, abdomen or pelvis. 3. Aortic Atherosclerosis (ICD10-I70.0) and Emphysema (ICD10-J43.9).   11/27/2019 Imaging   CT CAP w contrast  IMPRESSION: Status post  transverse colectomy with  right mid abdominal colostomy.   Mildly progressive hepatic metastases, as above.   No evidence of metastatic disease in the chest. Small mediastinal lymph nodes are within normal limits.   Additional stable ancillary findings as above.   01/21/2020 -  Chemotherapy   Patient is on Treatment Plan : COLORECTAL Bevacizumab q21d     02/21/2020 Imaging   IMPRESSION: 1. Stable hepatic metastatic disease. 2. Aortic atherosclerosis (ICD10-I70.0). Coronary artery calcification. 3.  Emphysema (ICD10-J43.9).   05/19/2020 Imaging   CT CAP  IMPRESSION: 1. Multiple partially calcified liver metastases are again noted. With the exception of a small lesion in segment 7/8 lesions are not significantly changed in the interval. No new liver lesions identified. 2. Coronary artery atherosclerotic calcifications. 3. Aortic atherosclerosis. 4. 2 mm right upper lobe lung nodule identified.  Unchanged.   Aortic Atherosclerosis (ICD10-I70.0).   11/17/2020 Imaging   CT CAP  IMPRESSION: 1. Partially calcified lesions throughout the liver are stable accounting for differences in technique, contrasted imaging on today's study is compared to noncontrast imaging on the prior. 2. No new hepatic lesions. 3. Tiny 3 mm pulmonary nodule in the RIGHT upper lobe unchanged since the prior study. Attention on follow-up. 4. Mild fullness of RIGHT paratracheal nodal tissue is minimally increased and borderline enlarged, attention on follow-up. 5. RIGHT lower quadrant colostomy. 6. 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 7. Aortic atherosclerosis.   06/09/2021 Imaging   EXAM: CT CHEST, ABDOMEN, AND PELVIS WITH CONTRAST  IMPRESSION: Stable calcified liver metastases.   Stable mildly enlarged right paratracheal lymph node.   No evidence of new or progressive metastatic disease.   Aortic  Atherosclerosis (ICD10-I70.0).   09/14/2021 PET scan   IMPRESSION: Multiple hypermetabolic and partially calcified liver metastases, without significant change compared to most recent CT.   Mild hypermetabolic mediastinal and bilateral hilar lymphadenopathy, also without significant change.   No evidence of new or progressive metastatic disease.     Electronically Signed   By: Marlaine Hind M.D.   On: 09/15/2021 11:04     CURRENT THERAPY: Bevacizumab/Capecitabine   INTERVAL HISTORY: Casey Black 61 y.o. female returns for follow-up of her metastatic colon cancer prior to receiving treatment with bevacizumab given on day 1 of a 21-day cycle.  She is tolerating this well.  She also is taking capecitabine twice daily for 14 days on and 7 days off every 21 days with good tolerance.  Her most recent restaging PET was completed on September 14, 2021 and it showed no progression of her metastatic disease.  She is due for restaging CT chest abdomen pelvis with the expected date as January 01, 2022.  This has not yet been scheduled.  She notes that her neuropathy is being managed with gabapentin twice a day however it is not working as well as it did initially.  She tolerates the gabapentin without any difficulty and denies any increased somnolence after taking it.  She notes intermittent fatigue that sometimes makes it difficult for her to exercise and has some peeling of the skin of her hands and on the soles of her feet that is managed with lotion.   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  MEDICAL HISTORY: Past Medical History:  Diagnosis Date   Anemia    low iron   Colon cancer ([place]) 01/2019   Hypertension 01/23/2019   Personal history of chemotherapy 01/2019   colon CA   SBO (small bowel obstruction) ([place]) 01/23/2019    SURGICAL HISTORY: Past Surgical History:  Procedure Laterality Date   CESAREAN SECTION     x2   COLOSTOMY N/A 01/24/2019   Procedure: End Loop Colostomy;  Surgeon: Clovis Riley, MD;  Location: [facility];  Service: General;  Laterality: N/A;   PARTIAL COLECTOMY N/A 01/24/2019   Procedure: PARTIAL COLECTOMY;  Surgeon: Clovis Riley, MD;  Location: [facility];  Service: General;  Laterality: N/A;   PORTACATH PLACEMENT Right 02/28/2019   Procedure: INSERTION PORT-A-CATH WITH ULTRASOUND GUIDANCE;  Surgeon: Clovis Riley, MD;  Location: [facility];  Service: General;  Laterality: Right;    SOCIAL HISTORY: Social History   Socioeconomic History   Marital status: Single    Spouse name: Not on file   Number of children: 2   Years of education: Not on file   Highest education level: Not on file  Occupational History   Not on file  Tobacco Use   Smoking status: Former    Packs/day: 0.50    Years: 29.00    Total pack years: 14.50    Types: Cigarettes    Quit date: 01/04/2019    Years since quitting: 2.9   Smokeless tobacco: Never   Tobacco comments:    desires patch  Vaping Use   Vaping Use: Never used  Substance and Sexual Activity   Alcohol use: Not Currently    Comment: a pint a week   Drug use: Never   Sexual activity: Not on file  Other Topics Concern   Not on file  Social History Narrative   Lives with daughter.  Two children.  One grand.      Social Determinants of Health   Financial Resource Strain: Not on file  Food Insecurity: Not on file  Transportation Needs: Not on file  Physical Activity: Not on file  Stress: Not on file  Social Connections: Not on file  Intimate Partner Violence: Not on file    FAMILY HISTORY: Family History  Problem Relation Age of Onset   Heart murmur Mother    Kidney failure Father    Hypertension Father    Hypertension Sister     Review of Systems  Constitutional:  Positive for fatigue. Negative for appetite change, chills, fever and unexpected weight change.  HENT:   Negative for hearing loss, lump/mass and trouble swallowing.   Eyes:  Negative for eye problems and icterus.  Respiratory:  Negative for chest tightness, cough and shortness of breath.   Cardiovascular:  Negative for chest pain, leg swelling and palpitations.  Gastrointestinal:  Negative for abdominal distention, abdominal pain, constipation, diarrhea, nausea and vomiting.  Endocrine: Negative for hot flashes.  Genitourinary:  Negative for difficulty urinating.   Musculoskeletal:  Negative for arthralgias.  Skin:  Negative for itching and rash.  Neurological:  Positive for numbness. Negative for dizziness, extremity weakness and headaches.  Hematological:  Negative for adenopathy. Does not bruise/bleed easily.  Psychiatric/Behavioral:  Negative for depression. The patient is not nervous/anxious.       PHYSICAL EXAMINATION  ECOG PERFORMANCE STATUS: 1 - Symptomatic but completely ambulatory  Vitals:   12/16/21 0905  BP: (!) 151/56  Pulse: 71  Resp: 16  Temp: 98.3 F (36.8 C)  SpO2: 100%    Physical Exam Constitutional:      General: She is not in acute distress.    Appearance: Normal appearance. She is not toxic-appearing.  HENT:     Head: Normocephalic and atraumatic.  Eyes:     General: No scleral icterus. Cardiovascular:     Rate and Rhythm: Normal rate and regular rhythm.     Pulses: Normal  pulses.     Heart sounds: Normal heart sounds.  Pulmonary:     Effort: Pulmonary effort is normal.     Breath sounds: Normal breath sounds.  Abdominal:     General: Abdomen is flat. Bowel sounds are normal. There is no distension.     Palpations: Abdomen is soft.     Tenderness: There is no abdominal tenderness.     Comments: Patient with colostomy.  Stoma is pink site is well maintained.  Musculoskeletal:        General: No swelling.     Cervical back: Neck supple.  Lymphadenopathy:     Cervical: No cervical adenopathy.  Skin:    General: Skin is warm and dry.     Findings: No rash.     Comments: Hyperpigmentation noted on the palms of the hands.  Very mild dryness no cracking noted  Neurological:     General: No focal deficit present.     Mental Status: She is alert.  Psychiatric:        Mood and Affect: Mood normal.        Behavior: Behavior normal.     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  She has no clinical signs of metastatic cancer progression.  She will continue on her treatment which she is tolerating well.  Her next restaging CT scan is due January 01, 2022 and this has not yet been scheduled.  I have sent my nurse a message to ask her to  help ensure that it is authorized so scheduling can go ahead and happen.  I increased her gabapentin to 300 mg 3 times a day and sent it to [facility] on [address].  She will let me know if she has any issues with this.  I gave her some additional Aveda lotion today for her hands and feet.  I recommended small short walks to help improve her fatigue.  We will see her back in 3 weeks for labs, follow-up, and her next treatment.  At this visit she should have her CT chest abdomen pelvis completed so that we can review those results with her.    All questions were answered. The patient knows to call the clinic with any problems, questions or concerns. We can certainly see the patient much sooner if necessary.  Total encounter time:20 minutes*in face-to-face visit time, chart review, lab review, care coordination, order entry, and documentation of the encounter time.    Wilber Bihari, NP 12/16/21 9:38 AM Medical Oncology and Hematology [facility] [place], [place] [address] Tel. [phone]    Fax. [phone]  *Total Encounter Time as defined by the Centers for Medicare and Medicaid Services includes, in addition to the face-to-face time of a patient visit (documented in the note above) non-face-to-face time: obtaining and reviewing outside history, ordering and reviewing medications, tests or procedures, care coordination (communications with other health care professionals or caregivers) and documentation in the medical record.

## 2021-12-17 ENCOUNTER — Encounter: Payer: Self-pay | Admitting: Hematology

## 2021-12-17 ENCOUNTER — Other Ambulatory Visit (HOSPITAL_COMMUNITY): Payer: Self-pay

## 2021-12-17 ENCOUNTER — Other Ambulatory Visit: Payer: Self-pay

## 2021-12-17 ENCOUNTER — Other Ambulatory Visit: Payer: Self-pay | Admitting: Hematology

## 2021-12-17 DIAGNOSIS — C189 Malignant neoplasm of colon, unspecified: Secondary | ICD-10-CM

## 2021-12-17 MED ORDER — CAPECITABINE 500 MG PO TABS
2000.0000 mg | ORAL_TABLET | Freq: Two times a day (BID) | ORAL | 1 refills | Status: DC
  Filled 2021-12-17: qty 112, 14d supply, fill #0

## 2021-12-17 MED ORDER — CAPECITABINE 500 MG PO TABS: 2000.0000 mg | ORAL_TABLET | Freq: Two times a day (BID) | ORAL | 1 refills | Status: DC

## 2021-12-18 ENCOUNTER — Other Ambulatory Visit (HOSPITAL_COMMUNITY): Payer: Self-pay

## 2021-12-24 ENCOUNTER — Inpatient Hospital Stay: Payer: Medicare Other

## 2021-12-24 ENCOUNTER — Ambulatory Visit (HOSPITAL_COMMUNITY)
Admission: RE | Admit: 2021-12-24 | Discharge: 2021-12-24 | Disposition: A | Discharge status: Home / Self Care | Payer: Medicare Other | Source: Ambulatory Visit | Attending: Hematology | Admitting: Hematology

## 2021-12-24 DIAGNOSIS — C787 Secondary malignant neoplasm of liver and intrahepatic bile duct: Secondary | ICD-10-CM | POA: Insufficient documentation

## 2021-12-24 DIAGNOSIS — C189 Malignant neoplasm of colon, unspecified: Secondary | ICD-10-CM | POA: Diagnosis present

## 2021-12-24 LAB — CBC WITH DIFFERENTIAL/PLATELET
Abs Immature Granulocytes: 0.02 10*3/uL (ref 0.00–0.07)
Basophils Absolute: 0 10*3/uL (ref 0.0–0.1)
Basophils Relative: 1 %
Eosinophils Absolute: 0.3 10*3/uL (ref 0.0–0.5)
Eosinophils Relative: 3 %
HCT: 34.7 % — ABNORMAL LOW (ref 36.0–46.0)
Hemoglobin: 11.5 g/dL — ABNORMAL LOW (ref 12.0–15.0)
Immature Granulocytes: 0 %
Lymphocytes Relative: 32 %
Lymphs Abs: 2.5 10*3/uL (ref 0.7–4.0)
MCH: 33.9 pg (ref 26.0–34.0)
MCHC: 33.1 g/dL (ref 30.0–36.0)
MCV: 102.4 fL — ABNORMAL HIGH (ref 80.0–100.0)
Monocytes Absolute: 0.5 10*3/uL (ref 0.1–1.0)
Monocytes Relative: 6 %
Neutro Abs: 4.6 10*3/uL (ref 1.7–7.7)
Neutrophils Relative %: 58 %
Platelets: 204 10*3/uL (ref 150–400)
RBC: 3.39 MIL/uL — ABNORMAL LOW (ref 3.87–5.11)
RDW: 16.9 % — ABNORMAL HIGH (ref 11.5–15.5)
WBC: 7.9 10*3/uL (ref 4.0–10.5)
nRBC: 0 % (ref 0.0–0.2)

## 2021-12-24 LAB — COMPREHENSIVE METABOLIC PANEL
ALT: 9 U/L (ref 0–44)
AST: 16 U/L (ref 15–41)
Albumin: 3.9 g/dL (ref 3.5–5.0)
Alkaline Phosphatase: 85 U/L (ref 38–126)
Anion gap: 5 (ref 5–15)
BUN: 29 mg/dL — ABNORMAL HIGH (ref 8–23)
CO2: 21 mmol/L — ABNORMAL LOW (ref 22–32)
Calcium: 9.6 mg/dL (ref 8.9–10.3)
Chloride: 110 mmol/L (ref 98–111)
Creatinine, Ser: 1.14 mg/dL — ABNORMAL HIGH (ref 0.44–1.00)
GFR, Estimated: 55 mL/min — ABNORMAL LOW (ref >60)
Glucose, Bld: 94 mg/dL (ref 70–99)
Potassium: 4.8 mmol/L (ref 3.5–5.1)
Sodium: 136 mmol/L (ref 135–145)
Total Bilirubin: 0.4 mg/dL (ref 0.3–1.2)
Total Protein: 7.6 g/dL (ref 6.5–8.1)

## 2021-12-24 MED ORDER — HEPARIN SOD (PORK) LOCK FLUSH 100 UNIT/ML IV SOLN
INTRAVENOUS | Status: AC
  Administered 2021-12-24: 500 [IU] via INTRAVENOUS
  Filled 2021-12-24: qty 5

## 2021-12-24 MED ORDER — IOHEXOL 300 MG/ML  SOLN
100.0000 mL | Freq: Once | INTRAMUSCULAR | Status: AC | PRN
  Administered 2021-12-24: 100 mL via INTRAVENOUS

## 2021-12-24 MED ORDER — SODIUM CHLORIDE (PF) 0.9 % IJ SOLN
INTRAMUSCULAR | Status: AC
  Filled 2021-12-24: qty 50

## 2021-12-24 MED ORDER — HEPARIN SOD (PORK) LOCK FLUSH 100 UNIT/ML IV SOLN: 500.0000 [IU] | Freq: Once | INTRAVENOUS | Status: AC

## 2021-12-30 ENCOUNTER — Other Ambulatory Visit (HOSPITAL_COMMUNITY): Payer: Self-pay

## 2022-01-06 ENCOUNTER — Encounter: Payer: Self-pay | Admitting: Hematology

## 2022-01-06 ENCOUNTER — Other Ambulatory Visit: Payer: Self-pay

## 2022-01-06 ENCOUNTER — Inpatient Hospital Stay: Payer: Medicare Other | Attending: Hematology

## 2022-01-06 ENCOUNTER — Inpatient Hospital Stay: Payer: Medicare Other

## 2022-01-06 ENCOUNTER — Inpatient Hospital Stay (HOSPITAL_BASED_OUTPATIENT_CLINIC_OR_DEPARTMENT_OTHER): Payer: Medicare Other | Admitting: Hematology

## 2022-01-06 VITALS — BP 161/58 | HR 73 | Temp 97.8°F | Resp 18 | Ht 67.0 in | Wt 252.1 lb

## 2022-01-06 VITALS — BP 134/58 | HR 65 | Temp 98.0°F | Resp 18

## 2022-01-06 DIAGNOSIS — Z5112 Encounter for antineoplastic immunotherapy: Secondary | ICD-10-CM | POA: Insufficient documentation

## 2022-01-06 DIAGNOSIS — C189 Malignant neoplasm of colon, unspecified: Secondary | ICD-10-CM

## 2022-01-06 DIAGNOSIS — C787 Secondary malignant neoplasm of liver and intrahepatic bile duct: Secondary | ICD-10-CM | POA: Insufficient documentation

## 2022-01-06 DIAGNOSIS — C184 Malignant neoplasm of transverse colon: Secondary | ICD-10-CM | POA: Insufficient documentation

## 2022-01-06 DIAGNOSIS — Z7189 Other specified counseling: Secondary | ICD-10-CM | POA: Diagnosis not present

## 2022-01-06 DIAGNOSIS — Z0000 Encounter for general adult medical examination without abnormal findings: Secondary | ICD-10-CM

## 2022-01-06 DIAGNOSIS — Z95828 Presence of other vascular implants and grafts: Secondary | ICD-10-CM

## 2022-01-06 DIAGNOSIS — Z79899 Other long term (current) drug therapy: Secondary | ICD-10-CM | POA: Insufficient documentation

## 2022-01-06 LAB — CBC WITH DIFFERENTIAL (CANCER CENTER ONLY)
Abs Immature Granulocytes: 0.01 10*3/uL (ref 0.00–0.07)
Basophils Absolute: 0 10*3/uL (ref 0.0–0.1)
Basophils Relative: 0 %
Eosinophils Absolute: 0.2 10*3/uL (ref 0.0–0.5)
Eosinophils Relative: 3 %
HCT: 33.5 % — ABNORMAL LOW (ref 36.0–46.0)
Hemoglobin: 11 g/dL — ABNORMAL LOW (ref 12.0–15.0)
Immature Granulocytes: 0 %
Lymphocytes Relative: 27 %
Lymphs Abs: 2 10*3/uL (ref 0.7–4.0)
MCH: 34.2 pg — ABNORMAL HIGH (ref 26.0–34.0)
MCHC: 32.8 g/dL (ref 30.0–36.0)
MCV: 104 fL — ABNORMAL HIGH (ref 80.0–100.0)
Monocytes Absolute: 0.5 10*3/uL (ref 0.1–1.0)
Monocytes Relative: 7 %
Neutro Abs: 4.7 10*3/uL (ref 1.7–7.7)
Neutrophils Relative %: 63 %
Platelet Count: 163 10*3/uL (ref 150–400)
RBC: 3.22 MIL/uL — ABNORMAL LOW (ref 3.87–5.11)
RDW: 18 % — ABNORMAL HIGH (ref 11.5–15.5)
WBC Count: 7.5 10*3/uL (ref 4.0–10.5)
nRBC: 0 % (ref 0.0–0.2)

## 2022-01-06 LAB — CMP (CANCER CENTER ONLY)
ALT: 8 U/L (ref 0–44)
AST: 15 U/L (ref 15–41)
Albumin: 3.9 g/dL (ref 3.5–5.0)
Alkaline Phosphatase: 88 U/L (ref 38–126)
Anion gap: 5 (ref 5–15)
BUN: 20 mg/dL (ref 8–23)
CO2: 21 mmol/L — ABNORMAL LOW (ref 22–32)
Calcium: 9.4 mg/dL (ref 8.9–10.3)
Chloride: 107 mmol/L (ref 98–111)
Creatinine: 0.95 mg/dL (ref 0.44–1.00)
GFR, Estimated: >60 mL/min (ref >60)
Glucose, Bld: 134 mg/dL — ABNORMAL HIGH (ref 70–99)
Potassium: 4.7 mmol/L (ref 3.5–5.1)
Sodium: 133 mmol/L — ABNORMAL LOW (ref 135–145)
Total Bilirubin: 0.6 mg/dL (ref 0.3–1.2)
Total Protein: 7.3 g/dL (ref 6.5–8.1)

## 2022-01-06 LAB — CEA (IN HOUSE-CHCC): CEA (CHCC-In House): 196.71 ng/mL — ABNORMAL HIGH (ref 0.00–5.00)

## 2022-01-06 LAB — TOTAL PROTEIN, URINE DIPSTICK: Protein, ur: NEGATIVE mg/dL

## 2022-01-06 MED ORDER — SODIUM CHLORIDE 0.9 % IV SOLN
7.5000 mg/kg | Freq: Once | INTRAVENOUS | Status: AC
  Administered 2022-01-06: 800 mg via INTRAVENOUS
  Filled 2022-01-06: qty 32

## 2022-01-06 MED ORDER — SODIUM CHLORIDE 0.9% FLUSH
10.0000 mL | INTRAVENOUS | Status: DC | PRN
  Administered 2022-01-06: 10 mL

## 2022-01-06 MED ORDER — SODIUM CHLORIDE 0.9 % IV SOLN: Freq: Once | INTRAVENOUS | Status: AC

## 2022-01-06 MED ORDER — CAPECITABINE 500 MG PO TABS: 2000.0000 mg | ORAL_TABLET | Freq: Two times a day (BID) | ORAL | 2 refills | Status: DC

## 2022-01-06 MED ORDER — HEPARIN SOD (PORK) LOCK FLUSH 100 UNIT/ML IV SOLN
500.0000 [IU] | Freq: Once | INTRAVENOUS | Status: AC | PRN
  Administered 2022-01-06: 500 [IU]

## 2022-01-06 NOTE — Progress Notes (Signed)
[facility]   [phone] [phone]   Clinic Follow up Note   Patient Care Team: [name], [name], MD as PCP - General (Internal Medicine) Minus Breeding, MD as PCP - Cardiology (Cardiology) Clovis Riley, MD as Consulting Physician (General Surgery) Truitt Merle, MD as Consulting Physician (Hematology) [name], NP as Nurse Practitioner (Nurse Practitioner)  Date of Service:  01/06/2022  CHIEF COMPLAINT: f/u of metastatic colon cancer  CURRENT THERAPY:  Maintenance Avastin and Xeloda, q3weeks, starting 01/21/20             -Xeloda dose: 2027m q12h on day 1-14  ASSESSMENT & PLAN:  Casey Truebais a 61y.o. female with   1. 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 -PET from 02/26/19 shows known liver metastasis and metastatic lymphadenopathy in chest and right axilla. -[name] shows MSI stable disease, Kras+ No targetable mutation, she is not a candidate for EGFR or immunotherapy. -Began palliative first line chemo on 03/12/2019, received 5FU/leuc with first 2 cycles for large open abdominal wound after surgery. She started full dose FOLFOX and avastin with cycle 2. Due to neuropathy Oxaliplatin was stopped after 09/24/19. -For convenience she was switched to oral chemo Xeloda 2 weeks on/1 week off and Bev every 3 weeks in 01/21/20. -restaging CT CAP 12/24/21 showed: stable hepatic metastases and right paratracheal lymph node; no new or progressive disease. I reviewed the results with her today. -She is clinically doing well aside from some aside from some issues with her right big toe; I encouraged her to use neosporin and continue vinegar soaks. She also notes some financial difficulties due to some  changes in her Medicaid coverage; I will reach out to our financial advocate. -labs reviewed, overall stable, urine protein negative. Adequate to continue Xeloda and bevacizumab maintenance therapy. We will consider dose reduction for next cycle if her symptoms persist.   2. Chemotherapy induced peripheral neuropathy (CIPN), G1 -secondary to Oxaliplatin, developed after cycle 12 FOLFOX. Oxaliplatin d/c after C15 on 09/24/19 -Neuropathy in fingers and feet are moderate and stable with mild decreased function in fingers.  -She is currently on gabapentin 100 mg AM and 300 mg qHS and titrate up if needed.    3. 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 Will give IV iron if needed  -mild and stable, hgb 11 today (01/06/22)   4. HTN -on hydralazine, isosorbide, lisinopril, amlodipine and HCTZ  -she monitors at home and is normal -Will monitor on bevacizumab.    6.  Bradycardia and high grade AV block -She has been bradycardic lately, heart rate of 41 on 11/03/21. I sent her to ED. -she was evaluated and felt to not need a pacemaker. Her metoprolol was stopped, and her HR has risen to WNL.     PLAN: -proceed with bevacizumab today  -continue Xeloda at same dose  -lab, flush, f/u, and beva every 3 weeks   No problem-specific Assessment & Plan notes found for this encounter.   SUMMARY OF ONCOLOGIC HISTORY: Oncology History Overview Note  Cancer Staging Adenocarcinoma of colon metastatic to liver ([facility] Staging form: Colon and Rectum, AJCC 8th Edition - Pathologic stage from 01/24/2019: Stage IVA (pT4a, pN1a, pM1a) -  Signed by [name], NP on 02/14/2019    Adenocarcinoma of colon metastatic to liver ([place])  01/23/2019 Imaging   ABD Xray IMPRESSION: 1. Bowel-gas pattern consistent with small bowel obstruction. No free air. 2. No acute chest findings.   01/23/2019 Imaging   CT AP  IMPRESSION: Obstructing mid transverse colonic mass with mild regional adenopathy and hepatic metastatic disease. The mass likely extends through the serosa; no ascites or peritoneal nodularity.   01/24/2019 Surgery   Surgeon: Clovis Riley MD Assistant: [name] Procedure performed: Transverse colectomy with end colostomy, liver biopsy Procedure classification: URGENT/EMERGENT Preop diagnosis: Obstructing, metastatic transverse colon mass Post-op diagnosis/intraop findings: Same   01/24/2019 Pathology Results   FINAL MICROSCOPIC DIAGNOSIS:   A. COLON, TRANSVERSE, RESECTION:  Colonic adenocarcinoma, 5 cm.  Carcinoma extends into pericolonic connective tissue and focally to  serosal surface.  Margins not involved.  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   B. LIVER NODULE, LEFT, BIOPSY:  Metastatic adenocarcinoma.    01/24/2019 Cancer Staging   Staging form: Colon and Rectum, AJCC 8th Edition - Pathologic stage from 01/24/2019: Stage IVA (pT4a, pN1a, pM1a) - Signed by [name], NP on 02/14/2019   02/02/2019 Initial Diagnosis   Adenocarcinoma of colon metastatic to liver ([place])   02/26/2019 PET scan   IMPRESSION: 1. Hypermetabolic metastatic disease in the liver and mediastinal/hilar/axillary lymph nodes. 2. Focal hypermetabolism in the rectum. Continued attention on follow-up exams is warranted. 3. Focal hypermetabolism medial to the right adrenal gland may be within a metastatic lymph node, better visualized on 01/23/2019. 4. Aortic atherosclerosis (ICD10-170.0). Coronary artery calcification.   03/12/2019 -  Chemotherapy   She started 5FU q2weeks on 03/12/19 for 2 cycles. She started full dose FOLFOX with Avastin on 04/09/19. Oxaliplatin dose reduced repeatedly due to neuropathy C12 and held since C16 on 10/09/19. Now on maintenance Avastin and 5FU q2weeks since 10/09/19       -Maintenance change to maintenance xeloda 2000 mg BID days 1-14  q21 days and q3 weeks Zirabev (15 mg/kg) starting 01/21/20. First cycle was taken 1036m BID due to misunderstanding.    05/31/2019 Imaging   Restaging CT CAP IMPRESSION: 1. Similar to mild interval decrease in size of multiple hepatic lesions, partially calcified. 2. 2 mm right upper lobe pulmonary nodule. Recommend attention on follow-up. 3. Emphysema and aortic atherosclerosis.   08/23/2019 Imaging   CT CAP w contrast  IMPRESSION: 1. The dominant peripheral right liver metastasis has mildly increased. Other smaller liver metastases are stable. 2. Otherwise no new or progressive metastatic disease in the chest, abdomen or pelvis. 3. Aortic Atherosclerosis (ICD10-I70.0) and Emphysema (ICD10-J43.9).   11/27/2019 Imaging   CT CAP w contrast  IMPRESSION: Status post transverse colectomy with right mid abdominal colostomy.   Mildly progressive hepatic metastases, as above.   No evidence of metastatic disease in the chest. Small mediastinal lymph nodes are within normal limits.   Additional stable ancillary findings as above.   01/21/2020 -  Chemotherapy   Patient is on Treatment Plan : COLORECTAL Bevacizumab q21d     02/21/2020 Imaging   IMPRESSION: 1. Stable hepatic metastatic disease. 2. Aortic atherosclerosis (ICD10-I70.0). Coronary artery calcification. 3.  Emphysema (ICD10-J43.9).   05/19/2020 Imaging   CT CAP  IMPRESSION: 1. Multiple partially calcified liver metastases are again noted. With the exception of a small lesion in segment 7/8 lesions are not significantly changed in the interval. No new liver lesions identified. 2. Coronary artery atherosclerotic  calcifications. 3. Aortic atherosclerosis. 4. 2 mm right upper lobe lung nodule identified.  Unchanged.   Aortic Atherosclerosis (ICD10-I70.0).   11/17/2020 Imaging   CT CAP  IMPRESSION: 1. Partially calcified lesions throughout the liver are stable accounting for differences in technique, contrasted imaging  on today's study is compared to noncontrast imaging on the prior. 2. No new hepatic lesions. 3. Tiny 3 mm pulmonary nodule in the RIGHT upper lobe unchanged since the prior study. Attention on follow-up. 4. Mild fullness of RIGHT paratracheal nodal tissue is minimally increased and borderline enlarged, attention on follow-up. 5. RIGHT lower quadrant colostomy. 6. 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 7. Aortic atherosclerosis.   06/09/2021 Imaging   EXAM: CT CHEST, ABDOMEN, AND PELVIS WITH CONTRAST  IMPRESSION: Stable calcified liver metastases.   Stable mildly enlarged right paratracheal lymph node.   No evidence of new or progressive metastatic disease.   Aortic Atherosclerosis (ICD10-I70.0).   09/14/2021 PET scan   IMPRESSION: Multiple hypermetabolic and partially calcified liver metastases, without significant change compared to most recent CT.   Mild hypermetabolic mediastinal and bilateral hilar lymphadenopathy, also without significant change.   No evidence of new or progressive metastatic disease.     Electronically Signed   By: Marlaine Hind M.D.   On: 09/15/2021 11:04      INTERVAL HISTORY:  Fleeta Kunde is here for a follow up of metastatic colon cancer. She was last seen by NP Mendel Ryder on 12/16/21. She presents to the clinic alone. She notes she is dealing with possible infection to her right big toe. She explains she is followed by podiatry, but they have not given her anything for it. She tells me her Medicaid coverage has changed.   All other systems were reviewed with the patient and are negative.  MEDICAL HISTORY:  Past Medical History:  Diagnosis Date   Anemia    low iron   Colon cancer ([place]) 01/2019   Hypertension 01/23/2019   Personal history of chemotherapy 01/2019   colon CA   SBO (small bowel obstruction) ([place])  01/23/2019    SURGICAL HISTORY: Past Surgical History:  Procedure Laterality Date   CESAREAN SECTION     x2   COLOSTOMY N/A 01/24/2019   Procedure: End Loop Colostomy;  Surgeon: Clovis Riley, MD;  Location: [place] [facility];  Service: General;  Laterality: N/A;   PARTIAL COLECTOMY N/A 01/24/2019   Procedure: PARTIAL COLECTOMY;  Surgeon: Clovis Riley, MD;  Location: [place] [facility];  Service: General;  Laterality: N/A;   PORTACATH PLACEMENT Right 02/28/2019   Procedure: INSERTION PORT-A-CATH WITH ULTRASOUND GUIDANCE;  Surgeon: Clovis Riley, MD;  Location: [facility];  Service: General;  Laterality: Right;    I have reviewed the social history and family history with the patient and they are unchanged from previous note.  ALLERGIES:  has No Known Allergies.  MEDICATIONS:  Current Outpatient Medications  Medication Sig Dispense Refill   acetaminophen (TYLENOL) 325 MG tablet Take 2 tablets (650 mg total) by mouth every 6 (six) hours as needed.     amLODipine (NORVASC) 10 MG tablet TAKE 1 TABLET BY MOUTH EVERY DAY 90 tablet 1   benzonatate (TESSALON) 100 MG capsule Take 1 capsule (100 mg total) by mouth every 8 (eight) hours. 21 capsule 0   capecitabine (XELODA) 500 MG tablet Take 4 tablets (2,000 mg total) by mouth 2 (two) times  daily after a meal. Take for 14 days, then off for 7 days. 112 tablet 2   ferrous sulfate 325 (65 FE) MG tablet TAKE 1 TABLET BY MOUTH 2 TIMES DAILY WITH A MEAL. 180 tablet 1   gabapentin (NEURONTIN) 300 MG capsule Take 1 capsule (300 mg total) by mouth 3 (three) times daily. 90 capsule 3   hydrALAZINE (APRESOLINE) 25 MG tablet TAKE 1 TABLET BY MOUTH EVERY 8 HOURS 270 tablet 1   hydrochlorothiazide (HYDRODIURIL) 12.5 MG tablet TAKE 1 TABLET BY MOUTH EVERY DAY 90 tablet 0   hydrocortisone (ANUSOL-HC) 2.5 % rectal cream Place 1 application rectally 2 (two) times daily. 30 g 0   Ibuprofen 200 MG CAPS Take 400 mg by mouth daily as needed (pain).     isosorbide mononitrate  (IMDUR) 30 MG 24 hr tablet TAKE 1 TABLET BY MOUTH EVERY DAY 90 tablet 1   lidocaine (LMX) 4 % cream Apply 1 application topically 3 (three) times daily as needed. 30 g 0   lidocaine-prilocaine (EMLA) cream Apply 1 application topically as needed. 30 g 1   lisinopril (ZESTRIL) 40 MG tablet Take 1 tablet (40 mg total) by mouth daily. 90 tablet 1   polycarbophil (FIBERCON) 625 MG tablet Take 1 tablet (625 mg total) by mouth daily. 30 tablet 0   Vitamin D, Ergocalciferol, (DRISDOL) 1.25 MG (50000 UNIT) CAPS capsule Take 1 capsule (50,000 Units total) by mouth every 7 (seven) days for 12 doses. 12 capsule 0   No current facility-administered medications for this visit.   Facility-Administered Medications Ordered in Other Visits  Medication Dose Route Frequency Provider Last Rate Last Admin   sodium chloride flush (NS) 0.9 % injection 10 mL  10 mL Intracatheter PRN Truitt Merle, MD   10 mL at 06/06/20 1051    PHYSICAL EXAMINATION: ECOG PERFORMANCE STATUS: 0 - Asymptomatic  Vitals:   01/06/22 1010  BP: (!) 161/58  Pulse: 73  Resp: 18  Temp: 97.8 F (36.6 C)  SpO2: 100%   Wt Readings from Last 3 Encounters:  01/06/22 252 lb 1.6 oz (114.4 kg)  12/16/21 252 lb (114.3 kg)  11/24/21 247 lb 8 oz (112.3 kg)     GENERAL:alert, no distress and comfortable SKIN: skin color normal, no rashes or significant lesions EYES: normal, Conjunctiva are pink and non-injected, sclera clear  NEURO: alert & oriented x 3 with fluent speech  LABORATORY DATA:  I have reviewed the data as listed    Latest Ref Rng & Units 01/06/2022    9:44 AM 12/24/2021    3:02 PM 12/16/2021    8:44 AM  CBC  WBC 4.0 - 10.5 K/uL 7.5  7.9  7.5   Hemoglobin 12.0 - 15.0 g/dL 11.0  11.5  11.0   Hematocrit 36.0 - 46.0 % 33.5  34.7  34.2   Platelets 150 - 400 K/uL 163  204  197         Latest Ref Rng & Units 01/06/2022    9:44 AM 12/24/2021    3:02 PM 11/24/2021   11:59 AM  CMP  Glucose 70 - 99 mg/dL 134  94  84   BUN 8 - 23  mg/dL 20  29  21    Creatinine 0.44 - 1.00 mg/dL 0.95  1.14  1.01   Sodium 135 - 145 mmol/L 133  136  135   Potassium 3.5 - 5.1 mmol/L 4.7  4.8  4.5   Chloride 98 - 111 mmol/L 107  110  108   CO2 22 - 32 mmol/L 21  21  23    Calcium 8.9 - 10.3 mg/dL 9.4  9.6  10.1   Total Protein 6.5 - 8.1 g/dL 7.3  7.6  7.9   Total Bilirubin 0.3 - 1.2 mg/dL 0.6  0.4  0.5   Alkaline Phos 38 - 126 U/L 88  85  85   AST 15 - 41 U/L 15  16  17    ALT 0 - 44 U/L 8  9  9        RADIOGRAPHIC STUDIES: I have personally reviewed the radiological images as listed and agreed with the findings in the report. No results found.    Orders Placed This Encounter  Procedures   CBC with Differential ([place] Only)    Standing Status:   Future    Standing Expiration Date:   01/28/2023   Total Protein, Urine dipstick    Standing Status:   Future    Standing Expiration Date:   01/28/2023   CBC with Differential ([place] Only)    Standing Status:   Future    Standing Expiration Date:   02/18/2023   Total Protein, Urine dipstick    Standing Status:   Future    Standing Expiration Date:   02/18/2023   All questions were answered. The patient knows to call the clinic with any problems, questions or concerns. No barriers to learning was detected. The total time spent in the appointment was 30 minutes.     Truitt Merle, MD 01/06/2022   I, Wilburn Mylar, am acting as scribe for Truitt Merle, MD.   I have reviewed the above documentation for accuracy and completeness, and I agree with the above.

## 2022-01-06 NOTE — Patient Instructions (Signed)
[facility]ICAL [facility]COL[facility]  Discharge Instructions: °Thank you for choosing [facility] to provide your oncology and hematology care.  ° °If you have a lab appointment with the Cancer Center, please go directly to the Cancer Center and check in at the registration area. °  °Wear comfortable clothing and clothing appropriate for easy access to any Portacath or PICC line.  ° °We strive to give you quality time with your provider. You may need to reschedule your appointment if you arrive late (15 or more minutes).  Arriving late affects you and other patients whose appointments are after yours.  Also, if you miss three or more appointments without notifying the office, you may be dismissed from the clinic at the provider’s discretion.    °  °For prescription refill requests, have your pharmacy contact our office and allow 72 hours for refills to be completed.   ° °Today you received the following chemotherapy and/or immunotherapy agents: Bevacizumab.     °  °To help prevent nausea and vomiting after your treatment, we encourage you to take your nausea medication as directed. ° °BELOW ARE SYMPTOMS THAT SHOULD BE REPORTED IMMEDIATELY: °*FEVER GREATER THAN 100.4 F (38 °C) OR HIGHER °*CHILLS OR SWEATING °*NAUSEA AND VOMITING THAT IS NOT CONTROLLED WITH YOUR NAUSEA MEDICATION °*UNUSUAL SHORTNESS OF BREATH °*UNUSUAL BRUISING OR BLEEDING °*URINARY PROBLEMS (pain or burning when urinating, or frequent urination) °*BOWEL PROBLEMS (unusual diarrhea, constipation, pain near the anus) °TENDERNESS IN MOUTH AND THROAT WITH OR WITHOUT PRESENCE OF ULCERS (sore throat, sores in mouth, or a toothache) °UNUSUAL RASH, SWELLING OR PAIN  °UNUSUAL VAGINAL DISCHARGE OR ITCHING  ° °Items with * indicate a potential emergency and should be followed up as soon as possible or go to the Emergency Department if any problems should occur. ° °Please show the CHEMOTHERAPY ALERT CARD or IMMUNOTHERAPY ALERT CARD at check-in  to the Emergency Department and triage nurse. ° °Should you have questions after your visit or need to cancel or reschedule your appointment, please contact [facility]  Dept: [phone]  and follow the prompts.  Office hours are 8:00 a.m. to 4:30 p.m. Monday - Friday. Please note that voicemails left after 4:00 p.m. may not be returned until the following business day.  We are closed weekends and major holidays. You have access to a nurse at all times for urgent questions. Please call the main number to the clinic Dept: [phone] and follow the prompts. ° ° °For any non-urgent questions, you may also contact your provider using MyChart. We now offer e-Visits for anyone 18 and older to request care online for non-urgent symptoms. For details visit [email or web address]. °  °Also download the MyChart app! Go to the app store, search "MyChart", open the app, select [facility], and log in with your MyChart username and password. ° °Due to Covid, a mask is required upon entering the hospital/clinic. If you do not have a mask, one will be given to you upon arrival. For doctor visits, patients may have 1 support person aged 18 or older with them. For treatment visits, patients cannot have anyone with them due to current Covid guidelines and our immunocompromised population.  ° °

## 2022-01-08 ENCOUNTER — Other Ambulatory Visit: Payer: Self-pay | Admitting: Internal Medicine

## 2022-01-08 ENCOUNTER — Other Ambulatory Visit: Payer: Self-pay

## 2022-01-10 ENCOUNTER — Other Ambulatory Visit: Payer: Self-pay

## 2022-01-20 ENCOUNTER — Telehealth: Payer: Self-pay | Admitting: Internal Medicine

## 2022-01-20 NOTE — Telephone Encounter (Signed)
Placed in Dr Ledell Noss red folder

## 2022-01-20 NOTE — Telephone Encounter (Signed)
Pt called to please ask for a refill of the ostomy bags. Pt states she realizes MD did not prescribe them to her, but she is no longer seeing the MD that did.  Pt only has 2 left.  Please advise.  LOV:  05/07/21  [facility] - [place], [place] Phone:  [phone]  Fax:  [phone]

## 2022-01-20 NOTE — Telephone Encounter (Addendum)
I just received the efax from [facility] with the request and saved it to MD's folder.

## 2022-01-21 ENCOUNTER — Other Ambulatory Visit: Payer: Self-pay

## 2022-01-21 NOTE — Telephone Encounter (Signed)
Form faxed and confirmed

## 2022-01-26 ENCOUNTER — Encounter: Payer: Self-pay | Admitting: Hematology

## 2022-01-26 ENCOUNTER — Inpatient Hospital Stay (HOSPITAL_BASED_OUTPATIENT_CLINIC_OR_DEPARTMENT_OTHER): Payer: Medicare Other | Admitting: Hematology

## 2022-01-26 ENCOUNTER — Other Ambulatory Visit: Payer: Self-pay

## 2022-01-26 ENCOUNTER — Inpatient Hospital Stay: Payer: Medicare Other

## 2022-01-26 VITALS — BP 150/61 | HR 72 | Temp 98.0°F | Resp 15 | Ht 67.0 in | Wt 251.3 lb

## 2022-01-26 DIAGNOSIS — C189 Malignant neoplasm of colon, unspecified: Secondary | ICD-10-CM

## 2022-01-26 DIAGNOSIS — C787 Secondary malignant neoplasm of liver and intrahepatic bile duct: Secondary | ICD-10-CM

## 2022-01-26 DIAGNOSIS — Z5112 Encounter for antineoplastic immunotherapy: Secondary | ICD-10-CM | POA: Diagnosis not present

## 2022-01-26 DIAGNOSIS — Z7189 Other specified counseling: Secondary | ICD-10-CM

## 2022-01-26 DIAGNOSIS — Z95828 Presence of other vascular implants and grafts: Secondary | ICD-10-CM

## 2022-01-26 LAB — COMPREHENSIVE METABOLIC PANEL
ALT: 9 U/L (ref 0–44)
AST: 18 U/L (ref 15–41)
Albumin: 3.8 g/dL (ref 3.5–5.0)
Alkaline Phosphatase: 80 U/L (ref 38–126)
Anion gap: 5 (ref 5–15)
BUN: 35 mg/dL — ABNORMAL HIGH (ref 8–23)
CO2: 20 mmol/L — ABNORMAL LOW (ref 22–32)
Calcium: 9.5 mg/dL (ref 8.9–10.3)
Chloride: 113 mmol/L — ABNORMAL HIGH (ref 98–111)
Creatinine, Ser: 1.12 mg/dL — ABNORMAL HIGH (ref 0.44–1.00)
GFR, Estimated: 56 mL/min — ABNORMAL LOW (ref >60)
Glucose, Bld: 93 mg/dL (ref 70–99)
Potassium: 5.6 mmol/L — ABNORMAL HIGH (ref 3.5–5.1)
Sodium: 138 mmol/L (ref 135–145)
Total Bilirubin: 0.4 mg/dL (ref 0.3–1.2)
Total Protein: 7.2 g/dL (ref 6.5–8.1)

## 2022-01-26 LAB — CBC WITH DIFFERENTIAL (CANCER CENTER ONLY)
Abs Immature Granulocytes: 0.02 10*3/uL (ref 0.00–0.07)
Basophils Absolute: 0 10*3/uL (ref 0.0–0.1)
Basophils Relative: 0 %
Eosinophils Absolute: 0.2 10*3/uL (ref 0.0–0.5)
Eosinophils Relative: 3 %
HCT: 32 % — ABNORMAL LOW (ref 36.0–46.0)
Hemoglobin: 10.6 g/dL — ABNORMAL LOW (ref 12.0–15.0)
Immature Granulocytes: 0 %
Lymphocytes Relative: 34 %
Lymphs Abs: 2.4 10*3/uL (ref 0.7–4.0)
MCH: 34.5 pg — ABNORMAL HIGH (ref 26.0–34.0)
MCHC: 33.1 g/dL (ref 30.0–36.0)
MCV: 104.2 fL — ABNORMAL HIGH (ref 80.0–100.0)
Monocytes Absolute: 0.7 10*3/uL (ref 0.1–1.0)
Monocytes Relative: 10 %
Neutro Abs: 3.7 10*3/uL (ref 1.7–7.7)
Neutrophils Relative %: 53 %
Platelet Count: 162 10*3/uL (ref 150–400)
RBC: 3.07 MIL/uL — ABNORMAL LOW (ref 3.87–5.11)
RDW: 18.1 % — ABNORMAL HIGH (ref 11.5–15.5)
WBC Count: 7 10*3/uL (ref 4.0–10.5)
nRBC: 0 % (ref 0.0–0.2)

## 2022-01-26 LAB — CEA (IN HOUSE-CHCC): CEA (CHCC-In House): 172.46 ng/mL — ABNORMAL HIGH (ref 0.00–5.00)

## 2022-01-26 LAB — TOTAL PROTEIN, URINE DIPSTICK: Protein, ur: NEGATIVE mg/dL

## 2022-01-26 MED ORDER — SODIUM CHLORIDE 0.9% FLUSH
10.0000 mL | INTRAVENOUS | Status: DC | PRN
  Administered 2022-01-26: 10 mL

## 2022-01-26 MED ORDER — SODIUM CHLORIDE 0.9 % IV SOLN: Freq: Once | INTRAVENOUS | Status: AC

## 2022-01-26 MED ORDER — HEPARIN SOD (PORK) LOCK FLUSH 100 UNIT/ML IV SOLN
500.0000 [IU] | Freq: Once | INTRAVENOUS | Status: AC | PRN
  Administered 2022-01-26: 500 [IU]

## 2022-01-26 MED ORDER — SODIUM CHLORIDE 0.9 % IV SOLN
7.5000 mg/kg | Freq: Once | INTRAVENOUS | Status: AC
  Administered 2022-01-26: 800 mg via INTRAVENOUS
  Filled 2022-01-26: qty 32

## 2022-01-26 NOTE — Progress Notes (Signed)
[place]   Telephone:(336) [phone] Fax:(336) [phone]   Clinic Follow up Note   Patient Care Team: [name], [name], MD as PCP - General (Internal Medicine) Minus Breeding, MD as PCP - Cardiology (Cardiology) Clovis Riley, MD as Consulting Physician (General Surgery) Truitt Merle, MD as Consulting Physician (Hematology) [name], NP as Nurse Practitioner (Nurse Practitioner)  Date of Service:  01/26/2022  CHIEF COMPLAINT: f/u of metastatic colon cancer  CURRENT THERAPY:  Maintenance Avastin and Xeloda, q3weeks, starting 01/21/20             -Xeloda dose: 2040m q12h on day 1-14  ASSESSMENT & PLAN:  Casey Ebrahimis a 61y.o. female with   1. 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 -PET from 02/26/19 shows known liver metastasis and metastatic lymphadenopathy in chest and right axilla. -[name] shows MSI stable disease, Kras+ No targetable mutation, she is not a candidate for EGFR or immunotherapy. -Began palliative first line chemo on 03/12/2019, received 5FU/leuc with first 2 cycles for large open abdominal wound after surgery. She started full dose FOLFOX and avastin with cycle 2. Due to neuropathy Oxaliplatin was stopped after 09/24/19. -For convenience she was switched to oral chemo Xeloda 2 weeks on/1 week off and Bev every 3 weeks in 01/21/20. -restaging CT CAP 12/24/21 showed: stable hepatic metastases and right paratracheal lymph node; no new or progressive disease. -she is clinically doing well and continues to tolerate treatment well. Labs reviewed, hgb slightly lower at 10.6 today, urine protein negative. Adequate to continue Xeloda and bevacizumab maintenance therapy.    2. Chemotherapy induced  peripheral neuropathy (CIPN), G1 -secondary to Oxaliplatin, developed after cycle 12 FOLFOX. Oxaliplatin d/c after C15 on 09/24/19 -Neuropathy in fingers and feet are moderate and stable with mild decreased function in fingers.  -She is currently on gabapentin 100 mg AM and 300 mg qHS and titrate up if needed.    3. 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 Will give IV iron if needed  -mild and stable, hgb down slightly to 10.6 today (01/26/22)   4. HTN -on hydralazine, isosorbide, lisinopril, amlodipine and HCTZ  -she monitors at home where it is normal -Will monitor on bevacizumab.    5.  Bradycardia and high grade AV block -She has been bradycardic lately, heart rate of 41 on 11/03/21. I sent her to ED. -she was evaluated and felt to not need a pacemaker. Her metoprolol was stopped, and her HR has risen to WNL.     PLAN: -proceed with bevacizumab today  -continue Xeloda at same dose  -lab, flush, f/u, and beva every 3 weeks   No problem-specific Assessment & Plan notes found for this encounter.   SUMMARY OF ONCOLOGIC HISTORY: Oncology History Overview Note  Cancer Staging Adenocarcinoma of colon metastatic to liver ([facility] Staging form: Colon and Rectum, AJCC 8th Edition - Pathologic stage from 01/24/2019: Stage IVA (pT4a, pN1a, pM1a) - Signed by BAlla Feeling NP on 02/14/2019    Adenocarcinoma of colon metastatic to liver ([place]  01/23/2019 Imaging   ABD Xray IMPRESSION: 1. Bowel-gas pattern consistent with small bowel obstruction. No free air. 2. No acute chest findings.   01/23/2019 Imaging  CT AP IMPRESSION: Obstructing mid transverse colonic mass with mild regional adenopathy and hepatic metastatic disease. The mass likely extends through the serosa; no ascites or peritoneal nodularity.   01/24/2019 Surgery   Surgeon: Clovis Riley MD Assistant: Jackson Latino  PA-C Procedure performed: Transverse colectomy with end colostomy, liver biopsy Procedure classification: URGENT/EMERGENT Preop diagnosis: Obstructing, metastatic transverse colon mass Post-op diagnosis/intraop findings: Same   01/24/2019 Pathology Results   FINAL MICROSCOPIC DIAGNOSIS:   A. COLON, TRANSVERSE, RESECTION:  Colonic adenocarcinoma, 5 cm.  Carcinoma extends into pericolonic connective tissue and focally to  serosal surface.  Margins not involved.  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   B. LIVER NODULE, LEFT, BIOPSY:  Metastatic adenocarcinoma.    01/24/2019 Cancer Staging   Staging form: Colon and Rectum, AJCC 8th Edition - Pathologic stage from 01/24/2019: Stage IVA (pT4a, pN1a, pM1a) - Signed by [name], NP on 02/14/2019   02/02/2019 Initial Diagnosis   Adenocarcinoma of colon metastatic to liver ([place])   02/26/2019 PET scan   IMPRESSION: 1. Hypermetabolic metastatic disease in the liver and mediastinal/hilar/axillary lymph nodes. 2. Focal hypermetabolism in the rectum. Continued attention on follow-up exams is warranted. 3. Focal hypermetabolism medial to the right adrenal gland may be within a metastatic lymph node, better visualized on 01/23/2019. 4. Aortic atherosclerosis (ICD10-170.0). Coronary artery calcification.   03/12/2019 -  Chemotherapy   She started 5FU q2weeks on 03/12/19 for 2 cycles. She started full dose FOLFOX with Avastin on 04/09/19. Oxaliplatin dose reduced repeatedly due to neuropathy C12 and held since C16 on 10/09/19. Now on maintenance Avastin and 5FU q2weeks since 10/09/19       -Maintenance change to maintenance xeloda 2000 mg BID days 1-14 q21 days and q3 weeks Zirabev (15 mg/kg) starting 01/21/20. First cycle was taken 1076m BID due to misunderstanding.    05/31/2019 Imaging   Restaging CT CAP IMPRESSION: 1. Similar to mild interval decrease in size of multiple hepatic lesions, partially calcified. 2. 2 mm  right upper lobe pulmonary nodule. Recommend attention on follow-up. 3. Emphysema and aortic atherosclerosis.   08/23/2019 Imaging   CT CAP w contrast  IMPRESSION: 1. The dominant peripheral right liver metastasis has mildly increased. Other smaller liver metastases are stable. 2. Otherwise no new or progressive metastatic disease in the chest, abdomen or pelvis. 3. Aortic Atherosclerosis (ICD10-I70.0) and Emphysema (ICD10-J43.9).   11/27/2019 Imaging   CT CAP w contrast  IMPRESSION: Status post transverse colectomy with right mid abdominal colostomy.   Mildly progressive hepatic metastases, as above.   No evidence of metastatic disease in the chest. Small mediastinal lymph nodes are within normal limits.   Additional stable ancillary findings as above.   01/21/2020 -  Chemotherapy   Patient is on Treatment Plan : COLORECTAL Bevacizumab q21d     02/21/2020 Imaging   IMPRESSION: 1. Stable hepatic metastatic disease. 2. Aortic atherosclerosis (ICD10-I70.0). Coronary artery calcification. 3.  Emphysema (ICD10-J43.9).   05/19/2020 Imaging   CT CAP  IMPRESSION: 1. Multiple partially calcified liver metastases are again noted. With the exception of a small lesion in segment 7/8 lesions are not significantly changed in the interval. No new liver lesions identified. 2. Coronary artery atherosclerotic calcifications. 3. Aortic atherosclerosis. 4. 2 mm right upper lobe lung nodule identified.  Unchanged.   Aortic Atherosclerosis (ICD10-I70.0).   11/17/2020 Imaging   CT CAP  IMPRESSION: 1. Partially calcified lesions throughout the liver are stable accounting for differences in technique, contrasted imaging on today's  study is compared to noncontrast imaging on the prior. 2. No new hepatic lesions. 3. Tiny 3 mm pulmonary nodule in the RIGHT upper lobe unchanged since the prior study. Attention on follow-up. 4. Mild fullness of RIGHT paratracheal nodal tissue is  minimally increased and borderline enlarged, attention on follow-up. 5. RIGHT lower quadrant colostomy. 6. 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 7. Aortic atherosclerosis.   06/09/2021 Imaging   EXAM: CT CHEST, ABDOMEN, AND PELVIS WITH CONTRAST  IMPRESSION: Stable calcified liver metastases.   Stable mildly enlarged right paratracheal lymph node.   No evidence of new or progressive metastatic disease.   Aortic Atherosclerosis (ICD10-I70.0).   09/14/2021 PET scan   IMPRESSION: Multiple hypermetabolic and partially calcified liver metastases, without significant change compared to most recent CT.   Mild hypermetabolic mediastinal and bilateral hilar lymphadenopathy, also without significant change.   No evidence of new or progressive metastatic disease.     Electronically Signed   By: Marlaine Hind M.D.   On: 09/15/2021 11:04      INTERVAL HISTORY:  Casey Black is here for a follow up of metastatic colon cancer. She was last seen by me on 01/06/22. She presents to the clinic alone. She reports she is doing well overall. She denies any new symptoms or complaints. She expressed she is glad to be doing as well as she is.   All other systems were reviewed with the patient and are negative.  MEDICAL HISTORY:  Past Medical History:  Diagnosis Date   Anemia    low iron   Colon cancer ([place]) 01/2019   Hypertension 01/23/2019   Personal history of chemotherapy 01/2019   colon CA   SBO (small bowel obstruction) ([place]) 01/23/2019    SURGICAL HISTORY: Past Surgical History:  Procedure Laterality Date   CESAREAN SECTION     x2   COLOSTOMY N/A 01/24/2019   Procedure: End Loop Colostomy;  Surgeon: Clovis Riley, MD;  Location: [facility];  Service: General;  Laterality: N/A;   PARTIAL COLECTOMY N/A 01/24/2019   Procedure: PARTIAL COLECTOMY;  Surgeon:  Clovis Riley, MD;  Location: [facility];  Service: General;  Laterality: N/A;   PORTACATH PLACEMENT Right 02/28/2019   Procedure: INSERTION PORT-A-CATH WITH ULTRASOUND GUIDANCE;  Surgeon: Clovis Riley, MD;  Location: [facility];  Service: General;  Laterality: Right;    I have reviewed the social history and family history with the patient and they are unchanged from previous note.  ALLERGIES:  has No Known Allergies.  MEDICATIONS:  Current Outpatient Medications  Medication Sig Dispense Refill   acetaminophen (TYLENOL) 325 MG tablet Take 2 tablets (650 mg total) by mouth every 6 (six) hours as needed.     amLODipine (NORVASC) 10 MG tablet TAKE 1 TABLET BY MOUTH EVERY DAY 90 tablet 1   benzonatate (TESSALON) 100 MG capsule Take 1 capsule (100 mg total) by mouth every 8 (eight) hours. 21 capsule 0   capecitabine (XELODA) 500 MG tablet Take 4 tablets (2,000 mg total) by mouth 2 (two) times daily after a meal. Take for 14 days, then off for 7 days. 112 tablet 2   ferrous sulfate 325 (65 FE) MG tablet TAKE 1 TABLET BY MOUTH 2 TIMES DAILY WITH A MEAL. 180 tablet 1   gabapentin (NEURONTIN) 300 MG capsule Take 1 capsule (300 mg total) by mouth 3 (three) times daily. [place]  capsule 3   hydrALAZINE (APRESOLINE) 25 MG tablet TAKE 1 TABLET BY MOUTH EVERY 8 HOURS 270 tablet 1   hydrochlorothiazide (HYDRODIURIL) 12.5 MG tablet TAKE 1 TABLET BY MOUTH EVERY DAY 90 tablet 0   hydrocortisone (ANUSOL-HC) 2.5 % rectal cream Place 1 application rectally 2 (two) times daily. 30 g 0   Ibuprofen 200 MG CAPS Take 400 mg by mouth daily as needed (pain).     isosorbide mononitrate (IMDUR) 30 MG 24 hr tablet TAKE 1 TABLET BY MOUTH EVERY DAY 90 tablet 1   lidocaine (LMX) 4 % cream Apply 1 application topically 3 (three) times daily as needed. 30 g 0   lidocaine-prilocaine (EMLA) cream Apply 1 application topically as needed. 30 g 1   lisinopril (ZESTRIL) 40 MG tablet Take 1 tablet (40 mg total) by mouth daily. SCHEDULE  APPT FOR FUTURE REFILLS 30 tablet 0   polycarbophil (FIBERCON) 625 MG tablet Take 1 tablet (625 mg total) by mouth daily. 30 tablet 0   Vitamin D, Ergocalciferol, (DRISDOL) 1.25 MG (50000 UNIT) CAPS capsule Take 1 capsule (50,000 Units total) by mouth every 7 (seven) days for 12 doses. 12 capsule 0   No current facility-administered medications for this visit.   Facility-Administered Medications Ordered in Other Visits  Medication Dose Route Frequency Provider Last Rate Last Admin   heparin lock flush 100 unit/mL  500 Units Intracatheter Once PRN Truitt Merle, MD       sodium chloride flush (NS) 0.9 % injection 10 mL  10 mL Intracatheter PRN Truitt Merle, MD   10 mL at 06/06/20 1051   sodium chloride flush (NS) 0.9 % injection 10 mL  10 mL Intracatheter PRN Truitt Merle, MD        PHYSICAL EXAMINATION: ECOG PERFORMANCE STATUS: 0 - Asymptomatic  Vitals:   01/26/22 1310  BP: (!) 150/61  Pulse: 72  Resp: 15  Temp: 98 F (36.7 C)  SpO2: 100%   Wt Readings from Last 3 Encounters:  01/26/22 251 lb 4.8 oz (114 kg)  01/06/22 252 lb 1.6 oz (114.4 kg)  12/16/21 252 lb (114.3 kg)     GENERAL:alert, no distress and comfortable SKIN: skin color normal, no rashes or significant lesions EYES: normal, Conjunctiva are pink and non-injected, sclera clear  NEURO: alert & oriented x 3 with fluent speech  LABORATORY DATA:  I have reviewed the data as listed    Latest Ref Rng & Units 01/26/2022   12:48 PM 01/06/2022    9:44 AM 12/24/2021    3:02 PM  CBC  WBC 4.0 - 10.5 K/uL 7.0  7.5  7.9   Hemoglobin 12.0 - 15.0 g/dL 10.6  11.0  11.5   Hematocrit 36.0 - 46.0 % 32.0  33.5  34.7   Platelets 150 - 400 K/uL 162  163  204         Latest Ref Rng & Units 01/26/2022   12:48 PM 01/06/2022    9:44 AM 12/24/2021    3:02 PM  CMP  Glucose 70 - 99 mg/dL 93  134  94   BUN 8 - 23 mg/dL 35  20  29   Creatinine 0.44 - 1.00 mg/dL 1.12  0.95  1.14   Sodium 135 - 145 mmol/L 138  133  136   Potassium 3.5 - 5.1  mmol/L 5.6  4.7  4.8   Chloride 98 - 111 mmol/L 113  107  110   CO2 22 - 32 mmol/L 20  21  21   Calcium 8.9 - 10.3 mg/dL 9.5  9.4  9.6   Total Protein 6.5 - 8.1 g/dL 7.2  7.3  7.6   Total Bilirubin 0.3 - 1.2 mg/dL 0.4  0.6  0.4   Alkaline Phos 38 - 126 U/L 80  88  85   AST 15 - 41 U/L 18  15  16    ALT 0 - 44 U/L 9  8  9        RADIOGRAPHIC STUDIES: I have personally reviewed the radiological images as listed and agreed with the findings in the report. No results found.    Orders Placed This Encounter  Procedures   Comprehensive metabolic panel    Standing Status:   Standing    Number of Occurrences:   50    Standing Expiration Date:   01/27/2023   CBC with Differential ([facility] Only)    Standing Status:   Future    Standing Expiration Date:   03/11/2023   Total Protein, Urine dipstick    Standing Status:   Future    Standing Expiration Date:   03/11/2023   All questions were answered. The patient knows to call the clinic with any problems, questions or concerns. No barriers to learning was detected. The total time spent in the appointment was 30 minutes.     Truitt Merle, MD 01/26/2022   I, Wilburn Mylar, am acting as scribe for Truitt Merle, MD.   I have reviewed the above documentation for accuracy and completeness, and I agree with the above.

## 2022-01-26 NOTE — Patient Instructions (Signed)
[facility] MEDICAL [facility]COLOGY  Discharge Instructions: Thank you for choosing [facility] to provide your oncology and hematology care.   If you have a lab appointment with the Cancer Center, please go directly to the Cancer Center and check in at the registration area.   Wear comfortable clothing and clothing appropriate for easy access to any Portacath or PICC line.   We strive to give you quality time with your provider. You may need to reschedule your appointment if you arrive late (15 or more minutes).  Arriving late affects you and other patients whose appointments are after yours.  Also, if you miss three or more appointments without notifying the office, you may be dismissed from the clinic at the provider's discretion.      For prescription refill requests, have your pharmacy contact our office and allow 72 hours for refills to be completed.    Today you received the following chemotherapy and/or immunotherapy agents zirabev      To help prevent nausea and vomiting after your treatment, we encourage you to take your nausea medication as directed.  BELOW ARE SYMPTOMS THAT SHOULD BE REPORTED IMMEDIATELY: *FEVER GREATER THAN 100.4 F (38 C) OR HIGHER *CHILLS OR SWEATING *NAUSEA AND VOMITING THAT IS NOT CONTROLLED WITH YOUR NAUSEA MEDICATION *UNUSUAL SHORTNESS OF BREATH *UNUSUAL BRUISING OR BLEEDING *URINARY PROBLEMS (pain or burning when urinating, or frequent urination) *BOWEL PROBLEMS (unusual diarrhea, constipation, pain near the anus) TENDERNESS IN MOUTH AND THROAT WITH OR WITHOUT PRESENCE OF ULCERS (sore throat, sores in mouth, or a toothache) UNUSUAL RASH, SWELLING OR PAIN  UNUSUAL VAGINAL DISCHARGE OR ITCHING   Items with * indicate a potential emergency and should be followed up as soon as possible or go to the Emergency Department if any problems should occur.  Please show the CHEMOTHERAPY ALERT CARD or IMMUNOTHERAPY ALERT CARD at check-in to the  Emergency Department and triage nurse.  Should you have questions after your visit or need to cancel or reschedule your appointment, please contact [facility]  Dept: [phone]  and follow the prompts.  Office hours are 8:00 a.m. to 4:30 p.m. Monday - Friday. Please note that voicemails left after 4:00 p.m. may not be returned until the following business day.  We are closed weekends and major holidays. You have access to a nurse at all times for urgent questions. Please call the main number to the clinic Dept: [phone] and follow the prompts.   For any non-urgent questions, you may also contact your provider using MyChart. We now offer e-Visits for anyone 18 and older to request care online for non-urgent symptoms. For details visit [email or web address].   Also download the MyChart app! Go to the app store, search "MyChart", open the app, select [facility], and log in with your MyChart username and password.  Masks are optional in the cancer centers. If you would like for your care team to wear a mask while they are taking care of you, please let them know. You may have one support person who is at least 62 years old accompany you for your appointments. 

## 2022-01-27 ENCOUNTER — Ambulatory Visit (INDEPENDENT_AMBULATORY_CARE_PROVIDER_SITE_OTHER): Payer: Medicare Other | Admitting: Podiatry

## 2022-01-27 ENCOUNTER — Encounter: Payer: Self-pay | Admitting: Podiatry

## 2022-01-27 DIAGNOSIS — G620 Drug-induced polyneuropathy: Secondary | ICD-10-CM

## 2022-01-27 DIAGNOSIS — M79674 Pain in right toe(s): Secondary | ICD-10-CM

## 2022-01-27 DIAGNOSIS — M79675 Pain in left toe(s): Secondary | ICD-10-CM

## 2022-01-27 DIAGNOSIS — B351 Tinea unguium: Secondary | ICD-10-CM

## 2022-01-27 DIAGNOSIS — T451X5A Adverse effect of antineoplastic and immunosuppressive drugs, initial encounter: Secondary | ICD-10-CM

## 2022-01-27 NOTE — Progress Notes (Signed)
This patient returns to my office for at risk foot care.  This patient requires this care by a professional since this patient will be at risk due to having neuropathy due to cancer treatment.  Patient is presently experiencing peeling in her bottom of both feet.  This patient is unable to cut nails herself since the patient cannot reach her nails.These nails are painful walking and wearing shoes.  This patient presents for at risk foot care today.  General Appearance  Alert, conversant and in no acute stress.  Vascular  Dorsalis pedis and posterior tibial  pulses are weakly  palpable  bilaterally.  Capillary return is within normal limits  bilaterally. Temperature is within normal limits  bilaterally.  Neurologic  Senn-Weinstein monofilament wire test diminished   bilaterally. Muscle power within normal limits bilaterally.  Nails Thick disfigured discolored nails with subungual debris  from hallux to fifth toes bilaterally. No evidence of bacterial infection or drainage bilaterally.  Orthopedic  No limitations of motion  feet .  No crepitus or effusions noted.  No bony pathology or digital deformities noted.  Skin  normotropic skin noted bilaterally.  No signs of infections or ulcers noted.  Asymptomatic porokeratosis sub 4th met left foot.  Desquamation noted plantar aspect feet  B/L.   Onychomycosis  Pain in right toes  Pain in left toes  Consent was obtained for treatment procedures.   Mechanical debridement of nails 1-5  bilaterally performed with a nail nipper.  Filed with dremel without incident.    Return office visit   4 months                   Told patient to return for periodic foot care and evaluation due to potential at risk complications.   Litzy Dicker DPM  

## 2022-01-28 ENCOUNTER — Other Ambulatory Visit: Payer: Self-pay | Admitting: Internal Medicine

## 2022-01-28 ENCOUNTER — Other Ambulatory Visit: Payer: Self-pay

## 2022-02-02 ENCOUNTER — Ambulatory Visit (INDEPENDENT_AMBULATORY_CARE_PROVIDER_SITE_OTHER): Payer: Medicare Other

## 2022-02-02 ENCOUNTER — Other Ambulatory Visit: Payer: Self-pay | Admitting: Internal Medicine

## 2022-02-02 DIAGNOSIS — Z23 Encounter for immunization: Secondary | ICD-10-CM

## 2022-02-02 DIAGNOSIS — Z0000 Encounter for general adult medical examination without abnormal findings: Secondary | ICD-10-CM

## 2022-02-17 ENCOUNTER — Other Ambulatory Visit: Payer: Self-pay

## 2022-02-17 ENCOUNTER — Inpatient Hospital Stay: Payer: Medicare Other | Attending: Hematology

## 2022-02-17 DIAGNOSIS — Z79899 Other long term (current) drug therapy: Secondary | ICD-10-CM | POA: Insufficient documentation

## 2022-02-17 DIAGNOSIS — Z5112 Encounter for antineoplastic immunotherapy: Secondary | ICD-10-CM | POA: Diagnosis not present

## 2022-02-17 DIAGNOSIS — C787 Secondary malignant neoplasm of liver and intrahepatic bile duct: Secondary | ICD-10-CM | POA: Insufficient documentation

## 2022-02-17 DIAGNOSIS — C184 Malignant neoplasm of transverse colon: Secondary | ICD-10-CM | POA: Insufficient documentation

## 2022-02-17 DIAGNOSIS — Z95828 Presence of other vascular implants and grafts: Secondary | ICD-10-CM

## 2022-02-17 DIAGNOSIS — Z7189 Other specified counseling: Secondary | ICD-10-CM

## 2022-02-17 LAB — CBC WITH DIFFERENTIAL (CANCER CENTER ONLY)
Abs Immature Granulocytes: 0.01 10*3/uL (ref 0.00–0.07)
Basophils Absolute: 0.1 10*3/uL (ref 0.0–0.1)
Basophils Relative: 1 %
Eosinophils Absolute: 0.2 10*3/uL (ref 0.0–0.5)
Eosinophils Relative: 3 %
HCT: 33.2 % — ABNORMAL LOW (ref 36.0–46.0)
Hemoglobin: 10.7 g/dL — ABNORMAL LOW (ref 12.0–15.0)
Immature Granulocytes: 0 %
Lymphocytes Relative: 27 %
Lymphs Abs: 1.9 10*3/uL (ref 0.7–4.0)
MCH: 34 pg (ref 26.0–34.0)
MCHC: 32.2 g/dL (ref 30.0–36.0)
MCV: 105.4 fL — ABNORMAL HIGH (ref 80.0–100.0)
Monocytes Absolute: 0.6 10*3/uL (ref 0.1–1.0)
Monocytes Relative: 8 %
Neutro Abs: 4.4 10*3/uL (ref 1.7–7.7)
Neutrophils Relative %: 61 %
Platelet Count: 168 10*3/uL (ref 150–400)
RBC: 3.15 MIL/uL — ABNORMAL LOW (ref 3.87–5.11)
RDW: 17 % — ABNORMAL HIGH (ref 11.5–15.5)
WBC Count: 7.2 10*3/uL (ref 4.0–10.5)
nRBC: 0 % (ref 0.0–0.2)

## 2022-02-17 LAB — COMPREHENSIVE METABOLIC PANEL
ALT: 11 U/L (ref 0–44)
AST: 17 U/L (ref 15–41)
Albumin: 4 g/dL (ref 3.5–5.0)
Alkaline Phosphatase: 88 U/L (ref 38–126)
Anion gap: 4 — ABNORMAL LOW (ref 5–15)
BUN: 35 mg/dL — ABNORMAL HIGH (ref 8–23)
CO2: 20 mmol/L — ABNORMAL LOW (ref 22–32)
Calcium: 9.8 mg/dL (ref 8.9–10.3)
Chloride: 113 mmol/L — ABNORMAL HIGH (ref 98–111)
Creatinine, Ser: 1.07 mg/dL — ABNORMAL HIGH (ref 0.44–1.00)
GFR, Estimated: 59 mL/min — ABNORMAL LOW (ref >60)
Glucose, Bld: 108 mg/dL — ABNORMAL HIGH (ref 70–99)
Potassium: 4.8 mmol/L (ref 3.5–5.1)
Sodium: 137 mmol/L (ref 135–145)
Total Bilirubin: 0.3 mg/dL (ref 0.3–1.2)
Total Protein: 7.9 g/dL (ref 6.5–8.1)

## 2022-02-17 LAB — CEA (IN HOUSE-CHCC): CEA (CHCC-In House): 233.58 ng/mL — ABNORMAL HIGH (ref 0.00–5.00)

## 2022-02-17 LAB — TOTAL PROTEIN, URINE DIPSTICK: Protein, ur: NEGATIVE mg/dL

## 2022-02-17 MED ORDER — SODIUM CHLORIDE 0.9% FLUSH
10.0000 mL | INTRAVENOUS | Status: DC | PRN
  Administered 2022-02-17: 10 mL

## 2022-02-17 MED ORDER — HEPARIN SOD (PORK) LOCK FLUSH 100 UNIT/ML IV SOLN
500.0000 [IU] | Freq: Once | INTRAVENOUS | Status: AC | PRN
  Administered 2022-02-17: 500 [IU]

## 2022-02-18 ENCOUNTER — Inpatient Hospital Stay (HOSPITAL_BASED_OUTPATIENT_CLINIC_OR_DEPARTMENT_OTHER): Payer: Medicare Other | Admitting: Hematology

## 2022-02-18 ENCOUNTER — Other Ambulatory Visit: Payer: Self-pay

## 2022-02-18 ENCOUNTER — Inpatient Hospital Stay: Payer: Medicare Other

## 2022-02-18 ENCOUNTER — Encounter: Payer: Self-pay | Admitting: Hematology

## 2022-02-18 ENCOUNTER — Other Ambulatory Visit: Payer: Medicare Other

## 2022-02-18 VITALS — BP 141/58 | HR 53 | Temp 97.5°F | Resp 18

## 2022-02-18 VITALS — BP 156/65 | HR 64 | Temp 98.2°F | Resp 16 | Ht 67.0 in | Wt 254.6 lb

## 2022-02-18 DIAGNOSIS — C189 Malignant neoplasm of colon, unspecified: Secondary | ICD-10-CM

## 2022-02-18 DIAGNOSIS — Z5112 Encounter for antineoplastic immunotherapy: Secondary | ICD-10-CM | POA: Diagnosis not present

## 2022-02-18 DIAGNOSIS — C787 Secondary malignant neoplasm of liver and intrahepatic bile duct: Secondary | ICD-10-CM

## 2022-02-18 DIAGNOSIS — Z7189 Other specified counseling: Secondary | ICD-10-CM

## 2022-02-18 MED ORDER — SODIUM CHLORIDE 0.9% FLUSH
10.0000 mL | INTRAVENOUS | Status: DC | PRN
  Administered 2022-02-18: 10 mL

## 2022-02-18 MED ORDER — SODIUM CHLORIDE 0.9 % IV SOLN: Freq: Once | INTRAVENOUS | Status: AC

## 2022-02-18 MED ORDER — HEPARIN SOD (PORK) LOCK FLUSH 100 UNIT/ML IV SOLN
500.0000 [IU] | Freq: Once | INTRAVENOUS | Status: AC | PRN
  Administered 2022-02-18: 500 [IU]

## 2022-02-18 MED ORDER — SODIUM CHLORIDE 0.9 % IV SOLN
7.5000 mg/kg | Freq: Once | INTRAVENOUS | Status: AC
  Administered 2022-02-18: 800 mg via INTRAVENOUS
  Filled 2022-02-18: qty 32

## 2022-02-18 NOTE — Progress Notes (Signed)
[facility]   [phone] [phone]   Clinic Follow up Note   Patient Care Team: [name], [name], MD as PCP - General (Internal Medicine) Minus Breeding, MD as PCP - Cardiology (Cardiology) Clovis Riley, MD as Consulting Physician (General Surgery) Truitt Merle, MD as Consulting Physician (Hematology) [name], NP as Nurse Practitioner (Nurse Practitioner)  Date of Service:  02/18/2022  CHIEF COMPLAINT: f/u of metastatic colon cancer   CURRENT THERAPY:  Maintenance Avastin and Xeloda, q3weeks, starting 01/21/20             -Xeloda dose: 2038m q12h on day 1-14  ASSESSMENT & PLAN:  MDanice Dippolitois a 61y.o. female with   1. 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 -PET from 02/26/19 shows known liver metastasis and metastatic lymphadenopathy in chest and right axilla. -[name] shows MSI stable disease, Kras+ No targetable mutation, she is not a candidate for EGFR or immunotherapy. -Began palliative first line chemo on 03/12/2019, received 5FU/leuc with first 2 cycles for large open abdominal wound after surgery. She started full dose FOLFOX and avastin with cycle 2. Due to neuropathy Oxaliplatin was stopped after 09/24/19. -For convenience she was switched to oral chemo Xeloda 2 weeks on/1 week off and Bev every 3 weeks in 01/21/20. -restaging CT CAP 12/24/21 showed: stable hepatic metastases and right paratracheal lymph node; no new or progressive disease. -she is clinically doing well and continues to tolerate treatment well. Labs from yesterday reviewed, hgb stable at 10.7 today, urine protein negative. Her CEA is up some from last visit, but it fluctuates and is overall stable. Adequate to  continue Xeloda and bevacizumab maintenance therapy.  -plan to repeat a scan in the end of 04/2022.   2. Chemotherapy induced peripheral neuropathy (CIPN), G1 -secondary to Oxaliplatin, developed after cycle 12 FOLFOX. Oxaliplatin d/c after C15 on 09/24/19 -Neuropathy in fingers and feet are moderate and stable with mild decreased function in fingers.  -She is currently on gabapentin 100 mg AM and 300 mg qHS and titrate up if needed.    3. 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 Will give IV iron if needed  -mild and stable, hgb stable from last treatment at 10.7 on 02/17/22   4. HTN -on hydralazine, isosorbide, lisinopril, amlodipine and HCTZ  -she monitors at home where it is normal -Will monitor on bevacizumab.    5.  Bradycardia and high grade AV block -she was bradycardic on 11/03/21, heart rate of 41. I sent her to ED. -she was evaluated and felt to not need a pacemaker. Her metoprolol was stopped, and her HR has risen to WNL.     PLAN: -proceed with bevacizumab today  -continue Xeloda at same dose  -lab, flush, and beva every 3 weeks -F/u in 3 weeks and again on 1/17 with lab, flush and CT CAP w contrast a few days before    No problem-specific Assessment & Plan notes found for this encounter.   SUMMARY OF ONCOLOGIC HISTORY: Oncology History Overview Note  Cancer Staging Adenocarcinoma of colon metastatic to liver ([facility] Staging form: Colon and Rectum, AJCC 8th Edition - Pathologic stage from 01/24/2019: Stage IVA (pT4a, pN1a, pM1a) - Signed  by [name], NP on 02/14/2019    Adenocarcinoma of colon metastatic to liver ([facility])  01/23/2019 Imaging   ABD Xray IMPRESSION: 1. Bowel-gas pattern consistent with small bowel obstruction. No free air. 2. No acute chest findings.   01/23/2019 Imaging   CT AP IMPRESSION: Obstructing mid transverse colonic mass with mild  regional adenopathy and hepatic metastatic disease. The mass likely extends through the serosa; no ascites or peritoneal nodularity.   01/24/2019 Surgery   Surgeon: Clovis Riley MD Assistant: [name] Procedure performed: Transverse colectomy with end colostomy, liver biopsy Procedure classification: URGENT/EMERGENT Preop diagnosis: Obstructing, metastatic transverse colon mass Post-op diagnosis/intraop findings: Same   01/24/2019 Pathology Results   FINAL MICROSCOPIC DIAGNOSIS:   A. COLON, TRANSVERSE, RESECTION:  Colonic adenocarcinoma, 5 cm.  Carcinoma extends into pericolonic connective tissue and focally to  serosal surface.  Margins not involved.  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   B. LIVER NODULE, LEFT, BIOPSY:  Metastatic adenocarcinoma.    01/24/2019 Cancer Staging   Staging form: Colon and Rectum, AJCC 8th Edition - Pathologic stage from 01/24/2019: Stage IVA (pT4a, pN1a, pM1a) - Signed by [name], NP on 02/14/2019   02/02/2019 Initial Diagnosis   Adenocarcinoma of colon metastatic to liver ([place])   02/26/2019 PET scan   IMPRESSION: 1. Hypermetabolic metastatic disease in the liver and mediastinal/hilar/axillary lymph nodes. 2. Focal hypermetabolism in the rectum. Continued attention on follow-up exams is warranted. 3. Focal hypermetabolism medial to the right adrenal gland may be within a metastatic lymph node, better visualized on 01/23/2019. 4. Aortic atherosclerosis (ICD10-170.0). Coronary artery calcification.   03/12/2019 -  Chemotherapy   She started 5FU q2weeks on 03/12/19 for 2 cycles. She started full dose FOLFOX with Avastin on 04/09/19. Oxaliplatin dose reduced repeatedly due to neuropathy C12 and held since C16 on 10/09/19. Now on maintenance Avastin and 5FU q2weeks since 10/09/19       -Maintenance change to maintenance xeloda 2000 mg BID days 1-14 q21 days and q3 weeks Zirabev (15 mg/kg) starting 01/21/20.  First cycle was taken 1053m BID due to misunderstanding.    05/31/2019 Imaging   Restaging CT CAP IMPRESSION: 1. Similar to mild interval decrease in size of multiple hepatic lesions, partially calcified. 2. 2 mm right upper lobe pulmonary nodule. Recommend attention on follow-up. 3. Emphysema and aortic atherosclerosis.   08/23/2019 Imaging   CT CAP w contrast  IMPRESSION: 1. The dominant peripheral right liver metastasis has mildly increased. Other smaller liver metastases are stable. 2. Otherwise no new or progressive metastatic disease in the chest, abdomen or pelvis. 3. Aortic Atherosclerosis (ICD10-I70.0) and Emphysema (ICD10-J43.9).   11/27/2019 Imaging   CT CAP w contrast  IMPRESSION: Status post transverse colectomy with right mid abdominal colostomy.   Mildly progressive hepatic metastases, as above.   No evidence of metastatic disease in the chest. Small mediastinal lymph nodes are within normal limits.   Additional stable ancillary findings as above.   01/21/2020 -  Chemotherapy   Patient is on Treatment Plan : COLORECTAL Bevacizumab q21d     02/21/2020 Imaging   IMPRESSION: 1. Stable hepatic metastatic disease. 2. Aortic atherosclerosis (ICD10-I70.0). Coronary artery calcification. 3.  Emphysema (ICD10-J43.9).   05/19/2020 Imaging   CT CAP  IMPRESSION: 1. Multiple partially calcified liver metastases are again noted. With the exception of a small lesion in segment 7/8 lesions are not significantly changed in the interval. No new liver lesions identified. 2. Coronary artery atherosclerotic calcifications.  3. Aortic atherosclerosis. 4. 2 mm right upper lobe lung nodule identified.  Unchanged.   Aortic Atherosclerosis (ICD10-I70.0).   11/17/2020 Imaging   CT CAP  IMPRESSION: 1. Partially calcified lesions throughout the liver are stable accounting for differences in technique, contrasted imaging on today's study is compared to noncontrast imaging on the  prior. 2. No new hepatic lesions. 3. Tiny 3 mm pulmonary nodule in the RIGHT upper lobe unchanged since the prior study. Attention on follow-up. 4. Mild fullness of RIGHT paratracheal nodal tissue is minimally increased and borderline enlarged, attention on follow-up. 5. RIGHT lower quadrant colostomy. 6. 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 7. Aortic atherosclerosis.   06/09/2021 Imaging   EXAM: CT CHEST, ABDOMEN, AND PELVIS WITH CONTRAST  IMPRESSION: Stable calcified liver metastases.   Stable mildly enlarged right paratracheal lymph node.   No evidence of new or progressive metastatic disease.   Aortic Atherosclerosis (ICD10-I70.0).   09/14/2021 PET scan   IMPRESSION: Multiple hypermetabolic and partially calcified liver metastases, without significant change compared to most recent CT.   Mild hypermetabolic mediastinal and bilateral hilar lymphadenopathy, also without significant change.   No evidence of new or progressive metastatic disease.     Electronically Signed   By: Marlaine Hind M.D.   On: 09/15/2021 11:04      INTERVAL HISTORY:  Casey Black is here for a follow up of metastatic colon cancer. She was last seen by me on 01/26/22. She presents to the clinic alone. She reports she is doing well overall, continuing to tolerate treatment. She denies any new pains.   All other systems were reviewed with the patient and are negative.  MEDICAL HISTORY:  Past Medical History:  Diagnosis Date   Anemia    low iron   Colon cancer ([place]) 01/2019   Hypertension 01/23/2019   Personal history of chemotherapy 01/2019   colon CA   SBO (small bowel obstruction) ([place]) 01/23/2019    SURGICAL HISTORY: Past Surgical History:  Procedure Laterality Date   CESAREAN SECTION     x2   COLOSTOMY N/A 01/24/2019   Procedure: End Loop Colostomy;   Surgeon: Clovis Riley, MD;  Location: [facility];  Service: General;  Laterality: N/A;   PARTIAL COLECTOMY N/A 01/24/2019   Procedure: PARTIAL COLECTOMY;  Surgeon: Clovis Riley, MD;  Location: [facility];  Service: General;  Laterality: N/A;   PORTACATH PLACEMENT Right 02/28/2019   Procedure: INSERTION PORT-A-CATH WITH ULTRASOUND GUIDANCE;  Surgeon: Clovis Riley, MD;  Location: [facility];  Service: General;  Laterality: Right;    I have reviewed the social history and family history with the patient and they are unchanged from previous note.  ALLERGIES:  has No Known Allergies.  MEDICATIONS:  Current Outpatient Medications  Medication Sig Dispense Refill   acetaminophen (TYLENOL) 325 MG tablet Take 2 tablets (650 mg total) by mouth every 6 (six) hours as needed.     amLODipine (NORVASC) 10 MG tablet TAKE 1 TABLET BY MOUTH EVERY DAY 90 tablet 1   benzonatate (TESSALON) 100 MG capsule Take 1 capsule (100 mg total) by mouth every 8 (eight) hours. 21 capsule 0   capecitabine (XELODA) 500 MG tablet Take 4 tablets (2,000 mg total) by mouth 2 (two) times daily after a meal. Take for 14 days, then off for 7 days. 112 tablet 2   ferrous sulfate 325 (65 FE)  MG tablet TAKE 1 TABLET BY MOUTH 2 TIMES DAILY WITH A MEAL. 180 tablet 1   gabapentin (NEURONTIN) 300 MG capsule Take 1 capsule (300 mg total) by mouth 3 (three) times daily. 90 capsule 3   hydrALAZINE (APRESOLINE) 25 MG tablet TAKE 1 TABLET BY MOUTH EVERY 8 HOURS 270 tablet 1   hydrochlorothiazide (HYDRODIURIL) 12.5 MG tablet TAKE 1 TABLET BY MOUTH EVERY DAY 90 tablet 0   hydrocortisone (ANUSOL-HC) 2.5 % rectal cream Place 1 application rectally 2 (two) times daily. 30 g 0   Ibuprofen 200 MG CAPS Take 400 mg by mouth daily as needed (pain).     isosorbide mononitrate (IMDUR) 30 MG 24 hr tablet TAKE 1 TABLET BY MOUTH EVERY DAY 90 tablet 1   lidocaine (LMX) 4 % cream Apply 1 application topically 3 (three) times daily as needed. 30 g 0    lidocaine-prilocaine (EMLA) cream Apply 1 application topically as needed. 30 g 1   lisinopril (ZESTRIL) 40 MG tablet TAKE 1 TABLET (40 MG TOTAL) BY MOUTH DAILY. SCHEDULE APPT FOR FUTURE REFILLS 30 tablet 0   polycarbophil (FIBERCON) 625 MG tablet Take 1 tablet (625 mg total) by mouth daily. 30 tablet 0   No current facility-administered medications for this visit.   Facility-Administered Medications Ordered in Other Visits  Medication Dose Route Frequency Provider Last Rate Last Admin   sodium chloride flush (NS) 0.9 % injection 10 mL  10 mL Intracatheter PRN Truitt Merle, MD   10 mL at 06/06/20 1051   sodium chloride flush (NS) 0.9 % injection 10 mL  10 mL Intracatheter PRN Truitt Merle, MD   10 mL at 02/18/22 1352    PHYSICAL EXAMINATION: ECOG PERFORMANCE STATUS: 0 - Asymptomatic  Vitals:   02/18/22 1130  BP: (!) 156/65  Pulse: 64  Resp: 16  Temp: 98.2 F (36.8 C)  SpO2: 100%   Wt Readings from Last 3 Encounters:  02/18/22 254 lb 9.6 oz (115.5 kg)  01/26/22 251 lb 4.8 oz (114 kg)  01/06/22 252 lb 1.6 oz (114.4 kg)     GENERAL:alert, no distress and comfortable SKIN: skin color normal, no rashes or significant lesions EYES: normal, Conjunctiva are pink and non-injected, sclera clear  NEURO: alert & oriented x 3 with fluent speech  LABORATORY DATA:  I have reviewed the data as listed    Latest Ref Rng & Units 02/17/2022   11:10 AM 01/26/2022   12:48 PM 01/06/2022    9:44 AM  CBC  WBC 4.0 - 10.5 K/uL 7.2  7.0  7.5   Hemoglobin 12.0 - 15.0 g/dL 10.7  10.6  11.0   Hematocrit 36.0 - 46.0 % 33.2  32.0  33.5   Platelets 150 - 400 K/uL 168  162  163         Latest Ref Rng & Units 02/17/2022   11:10 AM 01/26/2022   12:48 PM 01/06/2022    9:44 AM  CMP  Glucose 70 - 99 mg/dL 108  93  134   BUN 8 - 23 mg/dL 35  35  20   Creatinine 0.44 - 1.00 mg/dL 1.07  1.12  0.95   Sodium 135 - 145 mmol/L 137  138  133   Potassium 3.5 - 5.1 mmol/L 4.8  5.6  4.7   Chloride 98 - 111 mmol/L  113  113  107   CO2 22 - 32 mmol/L _0 Calcium 8.9 - 10.3 mg/dL 9.8  9.5  9.4   Total Protein 6.5 - 8.1 g/dL 7.9  7.2  7.3   Total Bilirubin 0.3 - 1.2 mg/dL 0.3  0.4  0.6   Alkaline Phos 38 - 126 U/L 88  80  88   AST 15 - 41 U/L _0 ALT 0 - 44 U/L _1 RADIOGRAPHIC STUDIES: I have personally reviewed the radiological images as listed and agreed with the findings in the report. No results found.    Orders Placed This Encounter  Procedures   CBC with Differential ([place] Only)    Standing Status:   Future    Standing Expiration Date:   04/01/2023   Total Protein, Urine dipstick    Standing Status:   Future    Standing Expiration Date:   04/01/2023   CBC with Differential (Cancer Center Only)    Standing Status:   Future    Standing Expiration Date:   04/22/2023   Total Protein, Urine dipstick    Standing Status:   Future    Standing Expiration Date:   04/22/2023   All questions were answered. The patient knows to call the clinic with any problems, questions or concerns. No barriers to learning was detected. The total time spent in the appointment was 30 minutes.     Truitt Merle, MD 02/18/2022   I, Baldemar Friday, CMA, am acting as scribe for Truitt Merle, MD.   I have reviewed the above documentation for accuracy and completeness, and I agree with the above.

## 2022-02-18 NOTE — Patient Instructions (Signed)
[facility]  Discharge Instructions: Thank you for choosing [facility] to provide your oncology and hematology care.   If you have a lab appointment with the [facility], please go directly to the [facility] and check in at the registration area.   Wear comfortable clothing and clothing appropriate for easy access to any Portacath or PICC line.   We strive to give you quality time with your provider. You may need to reschedule your appointment if you arrive late (15 or more minutes).  Arriving late affects you and other patients whose appointments are after yours.  Also, if you miss three or more appointments without notifying the office, you may be dismissed from the clinic at the provider's discretion.      For prescription refill requests, have your pharmacy contact our office and allow 72 hours for refills to be completed.    Today you received the following chemotherapy and/or immunotherapy agents zirabev      To help prevent nausea and vomiting after your treatment, we encourage you to take your nausea medication as directed.  BELOW ARE SYMPTOMS THAT SHOULD BE REPORTED IMMEDIATELY: *FEVER GREATER THAN 100.4 F (38 C) OR HIGHER *CHILLS OR SWEATING *NAUSEA AND VOMITING THAT IS NOT CONTROLLED WITH YOUR NAUSEA MEDICATION *UNUSUAL SHORTNESS OF BREATH *UNUSUAL BRUISING OR BLEEDING *URINARY PROBLEMS (pain or burning when urinating, or frequent urination) *BOWEL PROBLEMS (unusual diarrhea, constipation, pain near the anus) TENDERNESS IN MOUTH AND THROAT WITH OR WITHOUT PRESENCE OF ULCERS (sore throat, sores in mouth, or a toothache) UNUSUAL RASH, SWELLING OR PAIN  UNUSUAL VAGINAL DISCHARGE OR ITCHING   Items with * indicate a potential emergency and should be followed up as soon as possible or go to the Emergency Department if any problems should occur.  Please show the CHEMOTHERAPY ALERT CARD or IMMUNOTHERAPY ALERT CARD at check-in to the  Emergency Department and triage nurse.  Should you have questions after your visit or need to cancel or reschedule your appointment, please contact [facility]  Dept: [phone]  and follow the prompts.  Office hours are 8:00 a.m. to 4:30 p.m. Monday - Friday. Please note that voicemails left after 4:00 p.m. may not be returned until the following business day.  We are closed weekends and major holidays. You have access to a nurse at all times for urgent questions. Please call the main number to the clinic Dept: [phone] and follow the prompts.   For any non-urgent questions, you may also contact your provider using MyChart. We now offer e-Visits for anyone 34 and older to request care online for non-urgent symptoms. For details visit [email or web address].   Also download the MyChart app! Go to the app store, search "MyChart", open the app, select [facility], and log in with your MyChart username and password.  Masks are optional in the cancer centers. If you would like for your care team to wear a mask while they are taking care of you, please let them know. You may have one support person who is at least 62 years old accompany you for your appointments.

## 2022-02-19 ENCOUNTER — Inpatient Hospital Stay: Payer: Medicare HMO

## 2022-02-19 ENCOUNTER — Other Ambulatory Visit: Payer: Self-pay

## 2022-02-19 DIAGNOSIS — C787 Secondary malignant neoplasm of liver and intrahepatic bile duct: Secondary | ICD-10-CM

## 2022-02-19 DIAGNOSIS — Z7189 Other specified counseling: Secondary | ICD-10-CM

## 2022-03-04 ENCOUNTER — Other Ambulatory Visit (HOSPITAL_COMMUNITY): Payer: Self-pay

## 2022-03-09 NOTE — Progress Notes (Signed)
[facility]   Telephone:(336) [phone] Fax:([phone]) [phone]   Clinic Follow up Note   Patient Care Team: Philip Aspen, Limmie Patricia, MD as PCP - General (Internal Medicine) Rollene Rotunda, MD as PCP - Cardiology (Cardiology) Berna Bue, MD as Consulting Physician (General Surgery) Malachy Mood, MD as Consulting Physician (Hematology) Pollyann Samples, NP as Nurse Practitioner (Nurse Practitioner)  Date of Service:  03/10/2022  CHIEF COMPLAINT: f/u of metastatic colon cancer    CURRENT THERAPY:   Maintenance Avastin and Xeloda, q3weeks, starting 01/21/20             -Xeloda dose: 2000mg  q12h on day 1-14  ASSESSMENT:  Casey Black is a 61 y.o. 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  -Started first line chemo on 03/12/2019, received 5FU/leuc with first 2 cycles for large open abdominal wound after surgery. She started full dose FOLFOX and avastin with cycle 2. Due to neuropathy Oxaliplatin was stopped after 09/24/19.  --For convenience she was switched to oral chemo Xeloda 2 weeks on/1 week off and Bev every 3 weeks in 01/21/20. She is tolerating well  Chemotherapy-induced peripheral neuropathy (HCC) -secondary to chemo -on gabapentin, overall mild and stable   Hypertension -on hydralazine, isosorbide, lisinopril, amlodipine and HCTZ  -she monitors at home where it is normal -Will monitor on bevacizumab.   IDA (iron deficiency anemia) -She had low ferritin and low TIBC  -Takes oral iron BID, will continue. Will give IV iron if needed   Goals of care, counseling/discussion -We discussed the incurable nature of her cancer, and the overall poor prognosis, especially if she does not have good response to chemotherapy or progress on  chemo -The patient understands the goal of care is palliative. 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 Bowel-gas pattern consistent with small bowel obstruction. No free air. 2. No acute chest findings.   01/23/2019 Imaging   CT AP IMPRESSION: Obstructing mid transverse colonic mass with mild regional adenopathy and hepatic metastatic disease. The mass likely extends through the serosa; no ascites or peritoneal nodularity.   01/24/2019 Surgery   Surgeon: Berna Bue MD Assistant: Mattie Marlin PA-C Procedure performed: Transverse colectomy with end colostomy, liver biopsy Procedure classification: URGENT/EMERGENT Preop diagnosis: Obstructing, metastatic transverse colon mass Post-op diagnosis/intraop findings: Same   01/24/2019 Pathology Results   FINAL MICROSCOPIC DIAGNOSIS:   A. COLON, TRANSVERSE, RESECTION:  Colonic adenocarcinoma, 5 cm.  Carcinoma extends into pericolonic connective tissue and focally to  serosal surface.  Margins not involved.  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   B. LIVER NODULE, LEFT, BIOPSY:  Metastatic adenocarcinoma.    01/24/2019 Cancer Staging   Staging form: Colon  and Rectum, AJCC 8th Edition - Pathologic stage from 01/24/2019: Stage IVA (pT4a, pN1a, pM1a) - Signed by Pollyann Samples, NP on 02/14/2019   02/02/2019 Initial Diagnosis   Adenocarcinoma of  colon metastatic to liver (HCC)   02/26/2019 PET scan   IMPRESSION: 1. Hypermetabolic metastatic disease in the liver and mediastinal/hilar/axillary lymph nodes. 2. Focal hypermetabolism in the rectum. Continued attention on follow-up exams is warranted. 3. Focal hypermetabolism medial to the right adrenal gland may be within a metastatic lymph node, better visualized on 01/23/2019. 4. Aortic atherosclerosis (ICD10-170.0). Coronary artery calcification.   03/12/2019 -  Chemotherapy   She started 5FU q2weeks on 03/12/19 for 2 cycles. She started full dose FOLFOX with Avastin on 04/09/19. Oxaliplatin dose reduced repeatedly due to neuropathy C12 and held since C16 on 10/09/19. Now on maintenance Avastin and 5FU q2weeks since 10/09/19       -Maintenance change to maintenance xeloda 2000 mg BID days 1-14 q21 days and q3 weeks Zirabev (15 mg/kg) starting 01/21/20. First cycle was taken 1000mg  BID due to misunderstanding.    05/31/2019 Imaging   Restaging CT CAP IMPRESSION: 1. Similar to mild interval decrease in size of multiple hepatic lesions, partially calcified. 2. 2 mm right upper lobe pulmonary nodule. Recommend attention on follow-up. 3. Emphysema and aortic atherosclerosis.   08/23/2019 Imaging   CT CAP w contrast  IMPRESSION: 1. The dominant peripheral right liver metastasis has mildly increased. Other smaller liver metastases are stable. 2. Otherwise no new or progressive metastatic disease in the chest, abdomen or pelvis. 3. Aortic Atherosclerosis (ICD10-I70.0) and Emphysema (ICD10-J43.9).   11/27/2019 Imaging   CT CAP w contrast  IMPRESSION: Status post transverse colectomy with right mid abdominal colostomy.   Mildly progressive hepatic metastases, as above.   No evidence of metastatic disease in the chest. Small mediastinal lymph nodes are within normal limits.   Additional stable ancillary findings as above.   01/21/2020 -  Chemotherapy   Patient is on Treatment Plan :  COLORECTAL Bevacizumab q21d     02/21/2020 Imaging   IMPRESSION: 1. Stable hepatic metastatic disease. 2. Aortic atherosclerosis (ICD10-I70.0). Coronary artery calcification. 3.  Emphysema (ICD10-J43.9).   05/19/2020 Imaging   CT CAP  IMPRESSION: 1. Multiple partially calcified liver metastases are again noted. With the exception of a small lesion in segment 7/8 lesions are not significantly changed in the interval. No new liver lesions identified. 2. Coronary artery atherosclerotic calcifications. 3. Aortic atherosclerosis. 4. 2 mm right upper lobe lung nodule identified.  Unchanged.   Aortic Atherosclerosis (ICD10-I70.0).   11/17/2020 Imaging   CT CAP  IMPRESSION: 1. Partially calcified lesions throughout the liver are stable accounting for differences in technique, contrasted imaging on today's study is compared to noncontrast imaging on the prior. 2. No new hepatic lesions. 3. Tiny 3 mm pulmonary nodule in the RIGHT upper lobe unchanged since the prior study. Attention on follow-up. 4. Mild fullness of RIGHT paratracheal nodal tissue is minimally increased and borderline enlarged, attention on follow-up. 5. RIGHT lower quadrant colostomy. 6. 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 7. Aortic atherosclerosis.   06/09/2021 Imaging   EXAM: CT CHEST, ABDOMEN, AND PELVIS WITH CONTRAST  IMPRESSION: Stable calcified liver metastases.   Stable mildly enlarged right paratracheal lymph node.   No evidence of new or progressive metastatic disease.   Aortic Atherosclerosis (ICD10-I70.0).   09/14/2021 PET scan  IMPRESSION: Multiple hypermetabolic and partially calcified liver metastases, without significant change compared to most recent CT.   Mild hypermetabolic mediastinal and bilateral hilar lymphadenopathy, also without significant change.   No  evidence of new or progressive metastatic disease.     Electronically Signed   By: Danae Orleans M.D.   On: 09/15/2021 11:04      INTERVAL HISTORY:  Casey Black is here for a follow up of metastatic colon cancer  She was last seen by me on 02/18/2022 She presents to the clinic alone. Pt is overall doing well. She said she still has some darkness to her hands.   All other systems were reviewed with the patient and are negative.  MEDICAL HISTORY:  Past Medical History:  Diagnosis Date   Anemia    low iron   Colon cancer (HCC) 01/2019   Hypertension 01/23/2019   Personal history of chemotherapy 01/2019   colon CA   SBO (small bowel obstruction) (HCC) 01/23/2019    SURGICAL HISTORY: Past Surgical History:  Procedure Laterality Date   CESAREAN SECTION     x2   COLOSTOMY N/A 01/24/2019   Procedure: End Loop Colostomy;  Surgeon: Berna Bue, MD;  Location: [facility] OR;  Service: General;  Laterality: N/A;   PARTIAL COLECTOMY N/A 01/24/2019   Procedure: PARTIAL COLECTOMY;  Surgeon: Berna Bue, MD;  Location: [facility] OR;  Service: General;  Laterality: N/A;   PORTACATH PLACEMENT Right 02/28/2019   Procedure: INSERTION PORT-A-CATH WITH ULTRASOUND GUIDANCE;  Surgeon: Berna Bue, MD;  Location: [facility] OR;  Service: General;  Laterality: Right;    I have reviewed the social history and family history with the patient and they are unchanged from previous note.  ALLERGIES:  has No Known Allergies.  MEDICATIONS:  Current Outpatient Medications  Medication Sig Dispense Refill   acetaminophen (TYLENOL) 325 MG tablet Take 2 tablets (650 mg total) by mouth every 6 (six) hours as needed.     amLODipine (NORVASC) 10 MG tablet TAKE 1 TABLET BY MOUTH EVERY DAY 90 tablet 1   benzonatate (TESSALON) 100 MG capsule Take 1 capsule (100 mg total) by mouth every 8 (eight) hours. 21 capsule 0   capecitabine (XELODA) 500 MG tablet Take 4 tablets (2,000 mg total) by mouth 2 (two) times  daily after a meal. Take for 14 days, then off for 7 days. 112 tablet 2   ferrous sulfate 325 (65 FE) MG tablet TAKE 1 TABLET BY MOUTH 2 TIMES DAILY WITH A MEAL. 180 tablet 1   gabapentin (NEURONTIN) 300 MG capsule Take 1 capsule (300 mg total) by mouth 3 (three) times daily. 90 capsule 3   hydrALAZINE (APRESOLINE) 25 MG tablet TAKE 1 TABLET BY MOUTH EVERY 8 HOURS 270 tablet 1   hydrochlorothiazide (HYDRODIURIL) 12.5 MG tablet TAKE 1 TABLET BY MOUTH EVERY DAY 90 tablet 0   hydrocortisone (ANUSOL-HC) 2.5 % rectal cream Place 1 application rectally 2 (two) times daily. 30 g 0   Ibuprofen 200 MG CAPS Take 400 mg by mouth daily as needed (pain).     isosorbide mononitrate (IMDUR) 30 MG 24 hr tablet TAKE 1 TABLET BY MOUTH EVERY DAY 90 tablet 1   lidocaine (LMX) 4 % cream Apply 1 application topically 3 (three) times daily as needed. 30 g 0   lidocaine-prilocaine (EMLA) cream Apply 1 application topically as needed. 30 g 1   lisinopril (ZESTRIL) 40 MG tablet TAKE 1 TABLET (40 MG TOTAL) BY MOUTH DAILY. SCHEDULE APPT FOR  FUTURE REFILLS 30 tablet 0   polycarbophil (FIBERCON) 625 MG tablet Take 1 tablet (625 mg total) by mouth daily. 30 tablet 0   No current facility-administered medications for this visit.   Facility-Administered Medications Ordered in Other Visits  Medication Dose Route Frequency Provider Last Rate Last Admin   sodium chloride flush (NS) 0.9 % injection 10 mL  10 mL Intracatheter PRN Malachy Mood, MD   10 mL at 06/06/20 1051    PHYSICAL EXAMINATION: ECOG PERFORMANCE STATUS: 0 - Asymptomatic  Vitals:   03/10/22 1152  BP: (!) 146/60  Pulse: 66  Resp: 18  Temp: 97.7 F (36.5 C)  SpO2: 100%   Wt Readings from Last 3 Encounters:  03/10/22 256 lb 12.8 oz (116.5 kg)  02/18/22 254 lb 9.6 oz (115.5 kg)  01/26/22 251 lb 4.8 oz (114 kg)     GENERAL:alert, no distress and comfortable SKIN: skin color, texture, turgor are normal, no rashes or significant lesions EYES: normal,  Conjunctiva are pink and non-injected, sclera clear NECK: supple, thyroid normal size, non-tender, without nodularity LYMPH:  no palpable lymphadenopathy in the cervical, axillary  LUNGS: clear to auscultation and percussion with normal breathing effort HEART: regular rate & rhythm and no murmurs and no lower extremity edema ABDOMEN:abdomen soft, non-tender and normal bowel sounds Musculoskeletal:no cyanosis of digits and no clubbing  NEURO: alert & oriented x 3 with fluent speech, no focal motor/sensory deficits  LABORATORY DATA:  I have reviewed the data as listed    Latest Ref Rng & Units 03/10/2022   11:24 AM 02/17/2022   11:10 AM 01/26/2022   12:48 PM  CBC  WBC 4.0 - 10.5 K/uL 7.0  7.2  7.0   Hemoglobin 12.0 - 15.0 g/dL 21.3  08.6  57.8   Hematocrit 36.0 - 46.0 % 33.5  33.2  32.0   Platelets 150 - 400 K/uL 160  168  162         Latest Ref Rng & Units 02/17/2022   11:10 AM 01/26/2022   12:48 PM 01/06/2022    9:44 AM  CMP  Glucose 70 - 99 mg/dL 469  93  629   BUN 8 - 23 mg/dL 35  35  20   Creatinine 0.44 - 1.00 mg/dL 5.28  4.13  2.44   Sodium 135 - 145 mmol/L 137  138  133   Potassium 3.5 - 5.1 mmol/L 4.8  5.6  4.7   Chloride 98 - 111 mmol/L 113  113  107   CO2 22 - 32 mmol/L 20  20  21    Calcium 8.9 - 10.3 mg/dL 9.8  9.5  9.4   Total Protein 6.5 - 8.1 g/dL 7.9  7.2  7.3   Total Bilirubin 0.3 - 1.2 mg/dL 0.3  0.4  0.6   Alkaline Phos 38 - 126 U/L 88  80  88   AST 15 - 41 U/L 17  18  15    ALT 0 - 44 U/L 11  9  8        RADIOGRAPHIC STUDIES: I have personally reviewed the radiological images as listed and agreed with the findings in the report. No results found.    No orders of the defined types were placed in this encounter.  All questions were answered. The patient knows to call the clinic with any problems, questions or concerns. No barriers to learning was detected. The total time spent in the appointment was 30 minutes.     Malachy Mood, MD  03/10/2022   I,  Monica Martinez, CMA, am acting as scribe for Malachy Mood, MD.   I have reviewed the above documentation for accuracy and completeness, and I agree with the above.

## 2022-03-10 ENCOUNTER — Inpatient Hospital Stay (HOSPITAL_BASED_OUTPATIENT_CLINIC_OR_DEPARTMENT_OTHER): Payer: Medicare Other | Admitting: Hematology

## 2022-03-10 ENCOUNTER — Encounter: Payer: Self-pay | Admitting: Hematology

## 2022-03-10 ENCOUNTER — Inpatient Hospital Stay: Payer: Medicare Other

## 2022-03-10 ENCOUNTER — Other Ambulatory Visit: Payer: Self-pay

## 2022-03-10 ENCOUNTER — Inpatient Hospital Stay: Payer: Medicare Other | Attending: Hematology

## 2022-03-10 VITALS — BP 146/60 | HR 66 | Temp 97.7°F | Resp 18 | Ht 67.0 in | Wt 256.8 lb

## 2022-03-10 VITALS — BP 138/72 | HR 60 | Temp 98.2°F | Resp 18

## 2022-03-10 DIAGNOSIS — I10 Essential (primary) hypertension: Secondary | ICD-10-CM

## 2022-03-10 DIAGNOSIS — Z7189 Other specified counseling: Secondary | ICD-10-CM

## 2022-03-10 DIAGNOSIS — C787 Secondary malignant neoplasm of liver and intrahepatic bile duct: Secondary | ICD-10-CM | POA: Insufficient documentation

## 2022-03-10 DIAGNOSIS — G620 Drug-induced polyneuropathy: Secondary | ICD-10-CM

## 2022-03-10 DIAGNOSIS — C189 Malignant neoplasm of colon, unspecified: Secondary | ICD-10-CM | POA: Diagnosis not present

## 2022-03-10 DIAGNOSIS — Z79899 Other long term (current) drug therapy: Secondary | ICD-10-CM | POA: Insufficient documentation

## 2022-03-10 DIAGNOSIS — C184 Malignant neoplasm of transverse colon: Secondary | ICD-10-CM | POA: Insufficient documentation

## 2022-03-10 DIAGNOSIS — Z95828 Presence of other vascular implants and grafts: Secondary | ICD-10-CM

## 2022-03-10 DIAGNOSIS — D500 Iron deficiency anemia secondary to blood loss (chronic): Secondary | ICD-10-CM | POA: Diagnosis not present

## 2022-03-10 DIAGNOSIS — Z5112 Encounter for antineoplastic immunotherapy: Secondary | ICD-10-CM | POA: Diagnosis present

## 2022-03-10 DIAGNOSIS — C779 Secondary and unspecified malignant neoplasm of lymph node, unspecified: Secondary | ICD-10-CM | POA: Diagnosis not present

## 2022-03-10 DIAGNOSIS — T451X5A Adverse effect of antineoplastic and immunosuppressive drugs, initial encounter: Secondary | ICD-10-CM

## 2022-03-10 LAB — CBC WITH DIFFERENTIAL (CANCER CENTER ONLY)
Abs Immature Granulocytes: 0.02 10*3/uL (ref 0.00–0.07)
Basophils Absolute: 0 10*3/uL (ref 0.0–0.1)
Basophils Relative: 0 %
Eosinophils Absolute: 0.2 10*3/uL (ref 0.0–0.5)
Eosinophils Relative: 3 %
HCT: 33.5 % — ABNORMAL LOW (ref 36.0–46.0)
Hemoglobin: 10.9 g/dL — ABNORMAL LOW (ref 12.0–15.0)
Immature Granulocytes: 0 %
Lymphocytes Relative: 27 %
Lymphs Abs: 1.9 10*3/uL (ref 0.7–4.0)
MCH: 34.3 pg — ABNORMAL HIGH (ref 26.0–34.0)
MCHC: 32.5 g/dL (ref 30.0–36.0)
MCV: 105.3 fL — ABNORMAL HIGH (ref 80.0–100.0)
Monocytes Absolute: 0.7 10*3/uL (ref 0.1–1.0)
Monocytes Relative: 9 %
Neutro Abs: 4.2 10*3/uL (ref 1.7–7.7)
Neutrophils Relative %: 61 %
Platelet Count: 160 10*3/uL (ref 150–400)
RBC: 3.18 MIL/uL — ABNORMAL LOW (ref 3.87–5.11)
RDW: 17.3 % — ABNORMAL HIGH (ref 11.5–15.5)
WBC Count: 7 10*3/uL (ref 4.0–10.5)
nRBC: 0 % (ref 0.0–0.2)

## 2022-03-10 LAB — CEA (IN HOUSE-CHCC): CEA (CHCC-In House): 166.88 ng/mL — ABNORMAL HIGH (ref 0.00–5.00)

## 2022-03-10 LAB — TOTAL PROTEIN, URINE DIPSTICK: Protein, ur: NEGATIVE mg/dL

## 2022-03-10 MED ORDER — SODIUM CHLORIDE 0.9% FLUSH
10.0000 mL | INTRAVENOUS | Status: DC | PRN
  Administered 2022-03-10: 10 mL

## 2022-03-10 MED ORDER — SODIUM CHLORIDE 0.9 % IV SOLN: Freq: Once | INTRAVENOUS | Status: AC

## 2022-03-10 MED ORDER — CAPECITABINE 500 MG PO TABS: 2000.0000 mg | ORAL_TABLET | Freq: Two times a day (BID) | ORAL | 2 refills | Status: DC

## 2022-03-10 MED ORDER — SODIUM CHLORIDE 0.9 % IV SOLN
7.5000 mg/kg | Freq: Once | INTRAVENOUS | Status: AC
  Administered 2022-03-10: 800 mg via INTRAVENOUS
  Filled 2022-03-10: qty 32

## 2022-03-10 NOTE — Assessment & Plan Note (Signed)
-  on hydralazine, isosorbide, lisinopril, amlodipine and HCTZ  -she monitors at home where it is normal -Will monitor on bevacizumab.

## 2022-03-10 NOTE — Assessment & Plan Note (Signed)
-  secondary to chemo -on gabapentin, overall mild and stable

## 2022-03-10 NOTE — Assessment & Plan Note (Signed)
-  She had low ferritin and low TIBC  -Takes oral iron BID, will continue. Will give IV iron if needed

## 2022-03-10 NOTE — Assessment & Plan Note (Signed)
[id number] stage IV with liver and nodal metastasis, MSS, KRAS G12S(+)  -Diagnosed in 01/2019 after emergent colectomy and liver biopsy. 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  -Started first line chemo on 03/12/2019, received 5FU/leuc with first 2 cycles for large open abdominal wound after surgery. She started full dose FOLFOX and avastin with cycle 2. Due to neuropathy Oxaliplatin was stopped after 09/24/19.  --For convenience she was switched to oral chemo Xeloda 2 weeks on/1 week off and Bev every 3 weeks in 01/21/20. She is tolerating well

## 2022-03-10 NOTE — Assessment & Plan Note (Signed)
-  We discussed the incurable nature of her cancer, and the overall poor prognosis, especially if she does not have good response to chemotherapy or progress on chemo -The patient understands the goal of care is palliative. -I encourage her to have a living will

## 2022-03-11 ENCOUNTER — Other Ambulatory Visit: Payer: Self-pay | Admitting: Internal Medicine

## 2022-03-12 NOTE — Progress Notes (Signed)
The following biosimilar Vegzelma (bevacizumab-adcd)  has been selected for use in this patient.   Zyad Boomer D. Jakeim Sedore, PharmD  

## 2022-03-19 ENCOUNTER — Telehealth: Payer: Self-pay | Admitting: Hematology

## 2022-03-19 NOTE — Telephone Encounter (Signed)
Spoke with patient confirming updated appointments 12/27

## 2022-03-25 ENCOUNTER — Other Ambulatory Visit: Payer: Self-pay | Admitting: Hematology

## 2022-03-31 ENCOUNTER — Ambulatory Visit: Payer: Medicare Other

## 2022-03-31 ENCOUNTER — Inpatient Hospital Stay: Payer: Medicare Other

## 2022-03-31 ENCOUNTER — Other Ambulatory Visit: Payer: Medicare Other

## 2022-03-31 ENCOUNTER — Other Ambulatory Visit: Payer: Self-pay

## 2022-03-31 VITALS — BP 118/55 | HR 51 | Temp 98.2°F | Resp 18 | Wt 252.8 lb

## 2022-03-31 DIAGNOSIS — Z7189 Other specified counseling: Secondary | ICD-10-CM

## 2022-03-31 DIAGNOSIS — C189 Malignant neoplasm of colon, unspecified: Secondary | ICD-10-CM

## 2022-03-31 DIAGNOSIS — Z5112 Encounter for antineoplastic immunotherapy: Secondary | ICD-10-CM | POA: Diagnosis not present

## 2022-03-31 DIAGNOSIS — Z95828 Presence of other vascular implants and grafts: Secondary | ICD-10-CM

## 2022-03-31 LAB — CBC WITH DIFFERENTIAL (CANCER CENTER ONLY)
Abs Immature Granulocytes: 0.01 10*3/uL (ref 0.00–0.07)
Basophils Absolute: 0.1 10*3/uL (ref 0.0–0.1)
Basophils Relative: 1 %
Eosinophils Absolute: 0.2 10*3/uL (ref 0.0–0.5)
Eosinophils Relative: 2 %
HCT: 33.9 % — ABNORMAL LOW (ref 36.0–46.0)
Hemoglobin: 11.2 g/dL — ABNORMAL LOW (ref 12.0–15.0)
Immature Granulocytes: 0 %
Lymphocytes Relative: 32 %
Lymphs Abs: 2.4 10*3/uL (ref 0.7–4.0)
MCH: 34.4 pg — ABNORMAL HIGH (ref 26.0–34.0)
MCHC: 33 g/dL (ref 30.0–36.0)
MCV: 104 fL — ABNORMAL HIGH (ref 80.0–100.0)
Monocytes Absolute: 0.7 10*3/uL (ref 0.1–1.0)
Monocytes Relative: 9 %
Neutro Abs: 4.3 10*3/uL (ref 1.7–7.7)
Neutrophils Relative %: 56 %
Platelet Count: 175 10*3/uL (ref 150–400)
RBC: 3.26 MIL/uL — ABNORMAL LOW (ref 3.87–5.11)
RDW: 16.6 % — ABNORMAL HIGH (ref 11.5–15.5)
WBC Count: 7.6 10*3/uL (ref 4.0–10.5)
nRBC: 0 % (ref 0.0–0.2)

## 2022-03-31 LAB — TOTAL PROTEIN, URINE DIPSTICK: Protein, ur: NEGATIVE mg/dL

## 2022-03-31 LAB — COMPREHENSIVE METABOLIC PANEL
ALT: 10 U/L (ref 0–44)
AST: 17 U/L (ref 15–41)
Albumin: 3.8 g/dL (ref 3.5–5.0)
Alkaline Phosphatase: 80 U/L (ref 38–126)
Anion gap: 3 — ABNORMAL LOW (ref 5–15)
BUN: 33 mg/dL — ABNORMAL HIGH (ref 8–23)
CO2: 21 mmol/L — ABNORMAL LOW (ref 22–32)
Calcium: 9.7 mg/dL (ref 8.9–10.3)
Chloride: 111 mmol/L (ref 98–111)
Creatinine, Ser: 1.15 mg/dL — ABNORMAL HIGH (ref 0.44–1.00)
GFR, Estimated: 54 mL/min — ABNORMAL LOW (ref >60)
Glucose, Bld: 103 mg/dL — ABNORMAL HIGH (ref 70–99)
Potassium: 4.9 mmol/L (ref 3.5–5.1)
Sodium: 135 mmol/L (ref 135–145)
Total Bilirubin: 0.4 mg/dL (ref 0.3–1.2)
Total Protein: 7.8 g/dL (ref 6.5–8.1)

## 2022-03-31 LAB — CEA (IN HOUSE-CHCC): CEA (CHCC-In House): 174.83 ng/mL — ABNORMAL HIGH (ref 0.00–5.00)

## 2022-03-31 MED ORDER — SODIUM CHLORIDE 0.9% FLUSH
10.0000 mL | INTRAVENOUS | Status: DC | PRN
  Administered 2022-03-31: 10 mL

## 2022-03-31 MED ORDER — SODIUM CHLORIDE 0.9 % IV SOLN
7.5000 mg/kg | Freq: Once | INTRAVENOUS | Status: AC
  Administered 2022-03-31: 800 mg via INTRAVENOUS
  Filled 2022-03-31: qty 32

## 2022-03-31 MED ORDER — SODIUM CHLORIDE 0.9 % IV SOLN: Freq: Once | INTRAVENOUS | Status: AC

## 2022-03-31 MED ORDER — HEPARIN SOD (PORK) LOCK FLUSH 100 UNIT/ML IV SOLN
500.0000 [IU] | Freq: Once | INTRAVENOUS | Status: AC | PRN
  Administered 2022-03-31: 500 [IU]

## 2022-03-31 NOTE — Patient Instructions (Signed)
[facility]COL[facility]  Discharge Instructions: Thank you for choosing [facility] to provide your oncology and hematology care.   If you have a lab appointment with the [facility], please go directly to the [facility] and check in at the registration area.   Wear comfortable clothing and clothing appropriate for easy access to any Portacath or PICC line.   We strive to give you quality time with your provider. You may need to reschedule your appointment if you arrive late (15 or more minutes).  Arriving late affects you and other patients whose appointments are after yours.  Also, if you miss three or more appointments without notifying the office, you may be dismissed from the clinic at the provider's discretion.      For prescription refill requests, have your pharmacy contact our office and allow 72 hours for refills to be completed.    Today you received the following chemotherapy and/or immunotherapy agents zirabev      To help prevent nausea and vomiting after your treatment, we encourage you to take your nausea medication as directed.  BELOW ARE SYMPTOMS THAT SHOULD BE REPORTED IMMEDIATELY: *FEVER GREATER THAN 100.4 F (38 C) OR HIGHER *CHILLS OR SWEATING *NAUSEA AND VOMITING THAT IS NOT CONTROLLED WITH YOUR NAUSEA MEDICATION *UNUSUAL SHORTNESS OF BREATH *UNUSUAL BRUISING OR BLEEDING *URINARY PROBLEMS (pain or burning when urinating, or frequent urination) *BOWEL PROBLEMS (unusual diarrhea, constipation, pain near the anus) TENDERNESS IN MOUTH AND THROAT WITH OR WITHOUT PRESENCE OF ULCERS (sore throat, sores in mouth, or a toothache) UNUSUAL RASH, SWELLING OR PAIN  UNUSUAL VAGINAL DISCHARGE OR ITCHING   Items with * indicate a potential emergency and should be followed up as soon as possible or go to the Emergency Department if any problems should occur.  Please show the CHEMOTHERAPY ALERT CARD or IMMUNOTHERAPY ALERT CARD at check-in to the  Emergency Department and triage nurse.  Should you have questions after your visit or need to cancel or reschedule your appointment, please contact [facility]  Dept: [phone]  and follow the prompts.  Office hours are 8:00 a.m. to 4:30 p.m. Monday - Friday. Please note that voicemails left after 4:00 p.m. may not be returned until the following business day.  We are closed weekends and major holidays. You have access to a nurse at all times for urgent questions. Please call the main number to the clinic Dept: [phone] and follow the prompts.   For any non-urgent questions, you may also contact your provider using MyChart. We now offer e-Visits for anyone 66 and older to request care online for non-urgent symptoms. For details visit [email or web address].   Also download the MyChart app! Go to the app store, search "MyChart", open the app, select [facility], and log in with your MyChart username and password.  Masks are optional in the cancer centers. If you would like for your care team to wear a mask while they are taking care of you, please let them know. You may have one support Casey Black who is at least 62 years old accompany you for your appointments.

## 2022-04-07 ENCOUNTER — Other Ambulatory Visit: Payer: Self-pay | Admitting: Internal Medicine

## 2022-04-09 ENCOUNTER — Encounter: Payer: Self-pay | Admitting: Hematology

## 2022-04-15 ENCOUNTER — Ambulatory Visit (HOSPITAL_COMMUNITY)
Admission: RE | Admit: 2022-04-15 | Discharge: 2022-04-15 | Disposition: A | Discharge status: Home / Self Care | Payer: Medicare HMO | Source: Ambulatory Visit | Attending: Hematology | Admitting: Hematology

## 2022-04-15 DIAGNOSIS — K439 Ventral hernia without obstruction or gangrene: Secondary | ICD-10-CM | POA: Diagnosis not present

## 2022-04-15 DIAGNOSIS — C787 Secondary malignant neoplasm of liver and intrahepatic bile duct: Secondary | ICD-10-CM | POA: Insufficient documentation

## 2022-04-15 DIAGNOSIS — I700 Atherosclerosis of aorta: Secondary | ICD-10-CM | POA: Diagnosis not present

## 2022-04-15 DIAGNOSIS — C189 Malignant neoplasm of colon, unspecified: Secondary | ICD-10-CM

## 2022-04-15 DIAGNOSIS — Z433 Encounter for attention to colostomy: Secondary | ICD-10-CM | POA: Diagnosis not present

## 2022-04-15 MED ORDER — HEPARIN SOD (PORK) LOCK FLUSH 100 UNIT/ML IV SOLN
500.0000 [IU] | Freq: Once | INTRAVENOUS | Status: AC
  Administered 2022-04-15: 500 [IU] via INTRAVENOUS

## 2022-04-15 MED ORDER — IOHEXOL 300 MG/ML  SOLN
100.0000 mL | Freq: Once | INTRAMUSCULAR | Status: AC | PRN
  Administered 2022-04-15: 100 mL via INTRAVENOUS

## 2022-04-15 MED ORDER — HEPARIN SOD (PORK) LOCK FLUSH 100 UNIT/ML IV SOLN
INTRAVENOUS | Status: AC
  Filled 2022-04-15: qty 5

## 2022-04-15 MED ORDER — SODIUM CHLORIDE (PF) 0.9 % IJ SOLN
INTRAMUSCULAR | Status: AC
  Filled 2022-04-15: qty 50

## 2022-04-20 NOTE — Assessment & Plan Note (Signed)
-  She had low ferritin and low TIBC  -Takes oral iron BID, will continue. Will give IV iron if needed

## 2022-04-20 NOTE — Progress Notes (Signed)
[facility]   [phone] [phone]   Clinic Follow up Note   Patient Care Team: [name], [name], MD as PCP - General (Internal Medicine) Minus Breeding, MD as PCP - Cardiology (Cardiology) Clovis Riley, MD as Consulting Physician (General Surgery) Truitt Merle, MD as Consulting Physician (Hematology) [name], NP as Nurse Practitioner (Nurse Practitioner)  Date of Service:  04/21/2022  CHIEF COMPLAINT: f/u of metastatic colon cancer   CURRENT THERAPY:  Maintenance Avastin and Xeloda, q3weeks, starting 01/21/20             -Xeloda dose: '2000mg'$  q12h on day 1-14     ASSESSMENT:  Casey Black is a 62 y.o. 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  -Started first line chemo on 03/12/2019, received 5FU/leuc with first 2 cycles for large open abdominal wound after surgery. She started full dose FOLFOX and avastin with cycle 2. Due to neuropathy Oxaliplatin was stopped after 09/24/19.  --For convenience she was switched to oral chemo Xeloda 2 weeks on/1 week off and Bev every 3 weeks in 01/21/20. She is tolerating well -I personally reviewed her restaging CT scan from April 16, 2022, which showed stable liver metastasis, no other new lesions. -She is clinically doing well, will continue current therapy.  Chemotherapy-induced peripheral neuropathy (HCC) -secondary to chemo -on gabapentin, overall mild and stable   Goals of care, counseling/discussion -We discussed the incurable nature of her cancer, and the overall poor prognosis, especially if she does not have good response to chemotherapy or progress on chemo -The patient understands the goal of care is palliative. -I encourage her to have a  living will     IDA (iron deficiency anemia) -She had low ferritin and low TIBC  -Takes oral iron BID, will continue. Will give IV iron if needed     Hypertension -on hydralazine, isosorbide, lisinopril, amlodipine and HCTZ  -she monitors at home where it is normal -Will monitor on bevacizumab.     Left leg edema -New, started about a week ago -Due to her high risk of thrombosis, I will obtain Doppler to rule out DVT.   PLAN: -Discuss CT scan- stable disease  -lab reviewed -I refill the Xeloda -proceed with Beva today -Schedule next 2 cycle tx per IS  -Will obtain left lower extremity Doppler to rule out DVT.  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 Bowel-gas pattern consistent with small bowel obstruction. No free air. 2. No acute chest findings.   01/23/2019 Imaging   CT AP IMPRESSION: Obstructing mid transverse colonic mass with mild regional adenopathy and hepatic metastatic disease. The mass likely extends through the serosa; no ascites or peritoneal nodularity.   01/24/2019 Surgery   Surgeon: Clovis Riley MD Assistant: [name] Procedure performed: Transverse colectomy with end colostomy, liver biopsy Procedure classification: URGENT/EMERGENT Preop diagnosis: Obstructing, metastatic transverse colon mass Post-op diagnosis/intraop findings: Same   01/24/2019 Pathology Results   FINAL  MICROSCOPIC DIAGNOSIS:   A. COLON, TRANSVERSE, RESECTION:  Colonic adenocarcinoma, 5 cm.  Carcinoma extends into pericolonic connective tissue and focally to  serosal surface.  Margins not involved.  Metastatic  carcinoma in one of thirteen lymph nodes (1/13).   B. LIVER NODULE, LEFT, BIOPSY:  Metastatic adenocarcinoma.    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 Hypermetabolic metastatic disease in the liver and mediastinal/hilar/axillary lymph nodes. 2. Focal hypermetabolism in the rectum. Continued attention on follow-up exams is warranted. 3. Focal hypermetabolism medial to the right adrenal gland may be within a metastatic lymph node, better visualized on 01/23/2019. 4. Aortic atherosclerosis (ICD10-170.0). Coronary artery calcification.   03/12/2019 -  Chemotherapy   She started 5FU q2weeks on 03/12/19 for 2 cycles. She started full dose FOLFOX with Avastin on 04/09/19. Oxaliplatin dose reduced repeatedly due to neuropathy C12 and held since C16 on 10/09/19. Now on maintenance Avastin and 5FU q2weeks since 10/09/19       -Maintenance change to maintenance xeloda 2000 mg BID days 1-14 q21 days and q3 weeks Zirabev (15 mg/kg) starting 01/21/20. First cycle was taken '1000mg'$  BID due to misunderstanding.    05/31/2019 Imaging   Restaging CT CAP IMPRESSION: 1. Similar to mild interval decrease in size of multiple hepatic lesions, partially calcified. 2. 2 mm right upper lobe pulmonary nodule. Recommend attention on follow-up. 3. Emphysema and aortic atherosclerosis.   08/23/2019 Imaging   CT CAP w contrast  IMPRESSION: 1. The dominant peripheral right liver metastasis has mildly increased. Other smaller liver metastases are stable. 2. Otherwise no new or progressive metastatic disease in the chest, abdomen or pelvis. 3. Aortic Atherosclerosis (ICD10-I70.0) and Emphysema (ICD10-J43.9).   11/27/2019 Imaging   CT CAP w contrast   IMPRESSION: Status post transverse colectomy with right mid abdominal colostomy.   Mildly progressive hepatic metastases, as above.   No evidence of metastatic disease in the chest. Small mediastinal lymph nodes are within normal limits.   Additional stable ancillary findings as above.   01/21/2020 -  Chemotherapy   Patient is on Treatment Plan : COLORECTAL Bevacizumab q21d     02/21/2020 Imaging   IMPRESSION: 1. Stable hepatic metastatic disease. 2. Aortic atherosclerosis (ICD10-I70.0). Coronary artery calcification. 3.  Emphysema (ICD10-J43.9).   05/19/2020 Imaging   CT CAP  IMPRESSION: 1. Multiple partially calcified liver metastases are again noted. With the exception of a small lesion in segment 7/8 lesions are not significantly changed in the interval. No new liver lesions identified. 2. Coronary artery atherosclerotic calcifications. 3. Aortic atherosclerosis. 4. 2 mm right upper lobe lung nodule identified.  Unchanged.   Aortic Atherosclerosis (ICD10-I70.0).   11/17/2020 Imaging   CT CAP  IMPRESSION: 1. Partially calcified lesions throughout the liver are stable accounting for differences in technique, contrasted imaging on today's study is compared to noncontrast imaging on the prior. 2. No new hepatic lesions. 3. Tiny 3 mm pulmonary nodule in the RIGHT upper lobe unchanged since the prior study. Attention on follow-up. 4. Mild fullness of RIGHT paratracheal nodal tissue is minimally increased and borderline enlarged, attention on follow-up. 5. RIGHT lower quadrant colostomy. 6. 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 7. Aortic atherosclerosis.   06/09/2021 Imaging   EXAM: CT CHEST, ABDOMEN, AND PELVIS WITH CONTRAST  IMPRESSION: Stable calcified liver metastases.   Stable mildly enlarged right paratracheal lymph node.   No evidence of  new or progressive metastatic disease.   Aortic Atherosclerosis (ICD10-I70.0).   09/14/2021 PET scan   IMPRESSION: Multiple hypermetabolic and partially calcified liver metastases, without significant change compared to most recent CT.   Mild hypermetabolic mediastinal and bilateral hilar lymphadenopathy, also without significant change.   No evidence of new or progressive metastatic disease.     Electronically Signed   By: Marlaine Hind M.D.   On: 09/15/2021 11:04   04/15/2022 Imaging    IMPRESSION: 1. Several previously noted partially calcified hepatic metastases appear grossly stable compared to the prior examination. No new hepatic lesions or other signs of metastatic disease noted elsewhere in the chest, abdomen or pelvis. 2. Aortic atherosclerosis, in addition to left main and three-vessel coronary artery disease. Please note that although the presence of coronary artery calcium documents the presence of coronary artery disease, the severity of this disease and any potential stenosis cannot be assessed on this non-gated CT examination. Assessment for potential risk factor modification, dietary therapy or pharmacologic therapy may be warranted, if clinically indicated. 3. There are calcifications of the mitral annulus. Echocardiographic correlation for evaluation of potential valvular dysfunction may be warranted if clinically indicated. 4. Additional incidental findings, similar to prior studies, as above.      INTERVAL HISTORY:  Casey Black is here for a follow up of metastatic colon cancer  She was last seen by me on 03/10/2022  She presents to the clinic alone. Pt left foot is swollen. Pt states she had some tightness behind her right breast last week, pt thinks it may be due to her lifting things around the house. She states it only lasted two day and hasn't returned.      All other systems were reviewed with the patient and are negative.  MEDICAL  HISTORY:  Past Medical History:  Diagnosis Date   Anemia    low iron   Colon cancer ([place]) 01/2019   Hypertension 01/23/2019   Personal history of chemotherapy 01/2019   colon CA   SBO (small bowel obstruction) ([place]) 01/23/2019    SURGICAL HISTORY: Past Surgical History:  Procedure Laterality Date   CESAREAN SECTION     x2   COLOSTOMY N/A 01/24/2019   Procedure: End Loop Colostomy;  Surgeon: Clovis Riley, MD;  Location: [facility];  Service: General;  Laterality: N/A;   PARTIAL COLECTOMY N/A 01/24/2019   Procedure: PARTIAL COLECTOMY;  Surgeon: Clovis Riley, MD;  Location: [facility];  Service: General;  Laterality: N/A;   PORTACATH PLACEMENT Right 02/28/2019   Procedure: INSERTION PORT-A-CATH WITH ULTRASOUND GUIDANCE;  Surgeon: Clovis Riley, MD;  Location: [facility];  Service: General;  Laterality: Right;    I have reviewed the social history and family history with the patient and they are unchanged from previous note.  ALLERGIES:  has No Known Allergies.  MEDICATIONS:  Current Outpatient Medications  Medication Sig Dispense Refill   acetaminophen (TYLENOL) 325 MG tablet Take 2 tablets (650 mg total) by mouth every 6 (six) hours as needed.     amLODipine (NORVASC) 10 MG tablet TAKE 1 TABLET BY MOUTH EVERY DAY 90 tablet 1   benzonatate (TESSALON) 100 MG capsule Take 1 capsule (100 mg total) by mouth every 8 (  eight) hours. 21 capsule 0   capecitabine (XELODA) 500 MG tablet Take 4 tablets (2,000 mg total) by mouth 2 (two) times daily after a meal. Take for 14 days, then off for 7 days. 112 tablet 2   ferrous sulfate 325 (65 FE) MG tablet TAKE 1 TABLET BY MOUTH TWICE A DAY WITH FOOD 180 tablet 1   gabapentin (NEURONTIN) 300 MG capsule Take 1 capsule (300 mg total) by mouth 3 (three) times daily. 90 capsule 3   hydrALAZINE (APRESOLINE) 25 MG tablet TAKE 1 TABLET BY MOUTH EVERY 8 HOURS 270 tablet 0   hydrochlorothiazide (HYDRODIURIL) 12.5 MG tablet TAKE 1 TABLET BY MOUTH EVERY DAY  90 tablet 0   hydrocortisone (ANUSOL-HC) 2.5 % rectal cream Place 1 application rectally 2 (two) times daily. 30 g 0   Ibuprofen 200 MG CAPS Take 400 mg by mouth daily as needed (pain).     isosorbide mononitrate (IMDUR) 30 MG 24 hr tablet TAKE 1 TABLET BY MOUTH EVERY DAY 90 tablet 1   lidocaine (LMX) 4 % cream Apply 1 application topically 3 (three) times daily as needed. 30 g 0   lidocaine-prilocaine (EMLA) cream Apply 1 application topically as needed. 30 g 1   lisinopril (ZESTRIL) 40 MG tablet TAKE 1 TABLET (40 MG TOTAL) BY MOUTH DAILY. SCHEDULE APPT FOR FUTURE REFILLS 90 tablet 0   polycarbophil (FIBERCON) 625 MG tablet Take 1 tablet (625 mg total) by mouth daily. 30 tablet 0   No current facility-administered medications for this visit.   Facility-Administered Medications Ordered in Other Visits  Medication Dose Route Frequency Provider Last Rate Last Admin   sodium chloride flush (NS) 0.9 % injection 10 mL  10 mL Intracatheter PRN Truitt Merle, MD   10 mL at 06/06/20 1051    PHYSICAL EXAMINATION: ECOG PERFORMANCE STATUS: 0 - Asymptomatic  Vitals:   04/21/22 1103  BP: (!) 158/61  Pulse: (!) 58  Resp: 18  Temp: 97.9 F (36.6 C)  SpO2: 100%   Wt Readings from Last 3 Encounters:  04/21/22 255 lb 8 oz (115.9 kg)  03/31/22 252 lb 12 oz (114.6 kg)  03/10/22 256 lb 12.8 oz (116.5 kg)    ' GENERAL:alert, no distress and comfortable SKIN: skin color normal, no rashes or significant lesions EYES: normal, Conjunctiva are pink and non-injected, sclera clear  NEURO: alert & oriented x 3 with fluent speech {HEART: regular rate & rhythm and no murmurs and (+)lower extremity edema  LABORATORY DATA:  I have reviewed the data as listed    Latest Ref Rng & Units 04/21/2022   10:18 AM 03/31/2022    1:18 PM 03/10/2022   11:24 AM  CBC  WBC 4.0 - 10.5 K/uL 7.1  7.6  7.0   Hemoglobin 12.0 - 15.0 g/dL 11.6  11.2  10.9   Hematocrit 36.0 - 46.0 % 34.8  33.9  33.5   Platelets 150 - 400 K/uL  174  175  160         Latest Ref Rng & Units 04/21/2022   10:18 AM 03/31/2022    1:18 PM 02/17/2022   11:10 AM  CMP  Glucose 70 - 99 mg/dL 76  103  108   BUN 8 - 23 mg/dL 19  33  35   Creatinine 0.44 - 1.00 mg/dL 0.90  1.15  1.07   Sodium 135 - 145 mmol/L 134  135  137   Potassium 3.5 - 5.1 mmol/L 5.2  4.9  4.8  Chloride 98 - 111 mmol/L 109  111  113   CO2 22 - 32 mmol/L '21  21  20   '$ Calcium 8.9 - 10.3 mg/dL 9.8  9.7  9.8   Total Protein 6.5 - 8.1 g/dL 7.8  7.8  7.9   Total Bilirubin 0.3 - 1.2 mg/dL 0.4  0.4  0.3   Alkaline Phos 38 - 126 U/L 88  80  88   AST 15 - 41 U/L '18  17  17   '$ ALT 0 - 44 U/L '11  10  11       '$ RADIOGRAPHIC STUDIES: I have personally reviewed the radiological images as listed and agreed with the findings in the report. No results found.    Orders Placed This Encounter  Procedures   CBC with Differential ([facility] Only)    Standing Status:   Future    Standing Expiration Date:   06/03/2023   Total Protein, Urine dipstick    Standing Status:   Future    Standing Expiration Date:   06/03/2023   All questions were answered. The patient knows to call the clinic with any problems, questions or concerns. No barriers to learning was detected. The total time spent in the appointment was 30 minutes.     Truitt Merle, MD 04/21/2022   Felicity Coyer, CMA, am acting as scribe for Truitt Merle, MD.   I have reviewed the above documentation for accuracy and completeness, and I agree with the above.

## 2022-04-20 NOTE — Assessment & Plan Note (Signed)
-  We discussed the incurable nature of her cancer, and the overall poor prognosis, especially if she does not have good response to chemotherapy or progress on chemo -The patient understands the goal of care is palliative. -I encourage her to have a living will

## 2022-04-20 NOTE — Assessment & Plan Note (Signed)
[id number] stage IV with liver and nodal metastasis, MSS, KRAS G12S(+)  -Diagnosed in 01/2019 after emergent colectomy and liver biopsy. 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  -Started first line chemo on 03/12/2019, received 5FU/leuc with first 2 cycles for large open abdominal wound after surgery. She started full dose FOLFOX and avastin with cycle 2. Due to neuropathy Oxaliplatin was stopped after 09/24/19.  --For convenience she was switched to oral chemo Xeloda 2 weeks on/1 week off and Bev every 3 weeks in 01/21/20. She is tolerating well -I personally reviewed her restaging CT scan from April 16, 2022, which showed stable liver metastasis, no other new lesions. -She is clinically doing well, will continue current therapy.

## 2022-04-20 NOTE — Assessment & Plan Note (Signed)
-  on hydralazine, isosorbide, lisinopril, amlodipine and HCTZ  -she monitors at home where it is normal -Will monitor on bevacizumab.

## 2022-04-20 NOTE — Assessment & Plan Note (Signed)
-  secondary to chemo -on gabapentin, overall mild and stable

## 2022-04-21 ENCOUNTER — Inpatient Hospital Stay: Payer: Medicare HMO | Attending: Hematology | Admitting: Hematology

## 2022-04-21 ENCOUNTER — Other Ambulatory Visit: Payer: Self-pay

## 2022-04-21 ENCOUNTER — Inpatient Hospital Stay: Payer: Medicare HMO

## 2022-04-21 ENCOUNTER — Telehealth: Payer: Self-pay | Admitting: Internal Medicine

## 2022-04-21 VITALS — BP 158/61 | HR 58 | Temp 97.9°F | Resp 18 | Wt 255.5 lb

## 2022-04-21 DIAGNOSIS — I10 Essential (primary) hypertension: Secondary | ICD-10-CM | POA: Diagnosis not present

## 2022-04-21 DIAGNOSIS — Z5112 Encounter for antineoplastic immunotherapy: Secondary | ICD-10-CM | POA: Diagnosis not present

## 2022-04-21 DIAGNOSIS — D500 Iron deficiency anemia secondary to blood loss (chronic): Secondary | ICD-10-CM | POA: Diagnosis not present

## 2022-04-21 DIAGNOSIS — G620 Drug-induced polyneuropathy: Secondary | ICD-10-CM | POA: Diagnosis not present

## 2022-04-21 DIAGNOSIS — R600 Localized edema: Secondary | ICD-10-CM

## 2022-04-21 DIAGNOSIS — Z7189 Other specified counseling: Secondary | ICD-10-CM | POA: Diagnosis not present

## 2022-04-21 DIAGNOSIS — T451X5A Adverse effect of antineoplastic and immunosuppressive drugs, initial encounter: Secondary | ICD-10-CM | POA: Diagnosis not present

## 2022-04-21 DIAGNOSIS — Z79899 Other long term (current) drug therapy: Secondary | ICD-10-CM | POA: Diagnosis not present

## 2022-04-21 DIAGNOSIS — C189 Malignant neoplasm of colon, unspecified: Secondary | ICD-10-CM | POA: Diagnosis not present

## 2022-04-21 DIAGNOSIS — C787 Secondary malignant neoplasm of liver and intrahepatic bile duct: Secondary | ICD-10-CM | POA: Diagnosis not present

## 2022-04-21 DIAGNOSIS — C779 Secondary and unspecified malignant neoplasm of lymph node, unspecified: Secondary | ICD-10-CM | POA: Insufficient documentation

## 2022-04-21 DIAGNOSIS — Z95828 Presence of other vascular implants and grafts: Secondary | ICD-10-CM

## 2022-04-21 LAB — COMPREHENSIVE METABOLIC PANEL
ALT: 11 U/L (ref 0–44)
AST: 18 U/L (ref 15–41)
Albumin: 3.9 g/dL (ref 3.5–5.0)
Alkaline Phosphatase: 88 U/L (ref 38–126)
Anion gap: 4 — ABNORMAL LOW (ref 5–15)
BUN: 19 mg/dL (ref 8–23)
CO2: 21 mmol/L — ABNORMAL LOW (ref 22–32)
Calcium: 9.8 mg/dL (ref 8.9–10.3)
Chloride: 109 mmol/L (ref 98–111)
Creatinine, Ser: 0.9 mg/dL (ref 0.44–1.00)
GFR, Estimated: >60 mL/min (ref >60)
Glucose, Bld: 76 mg/dL (ref 70–99)
Potassium: 5.2 mmol/L — ABNORMAL HIGH (ref 3.5–5.1)
Sodium: 134 mmol/L — ABNORMAL LOW (ref 135–145)
Total Bilirubin: 0.4 mg/dL (ref 0.3–1.2)
Total Protein: 7.8 g/dL (ref 6.5–8.1)

## 2022-04-21 LAB — CBC WITH DIFFERENTIAL (CANCER CENTER ONLY)
Abs Immature Granulocytes: 0.02 10*3/uL (ref 0.00–0.07)
Basophils Absolute: 0 10*3/uL (ref 0.0–0.1)
Basophils Relative: 0 %
Eosinophils Absolute: 0.2 10*3/uL (ref 0.0–0.5)
Eosinophils Relative: 3 %
HCT: 34.8 % — ABNORMAL LOW (ref 36.0–46.0)
Hemoglobin: 11.6 g/dL — ABNORMAL LOW (ref 12.0–15.0)
Immature Granulocytes: 0 %
Lymphocytes Relative: 28 %
Lymphs Abs: 2 10*3/uL (ref 0.7–4.0)
MCH: 34 pg (ref 26.0–34.0)
MCHC: 33.3 g/dL (ref 30.0–36.0)
MCV: 102.1 fL — ABNORMAL HIGH (ref 80.0–100.0)
Monocytes Absolute: 0.7 10*3/uL (ref 0.1–1.0)
Monocytes Relative: 10 %
Neutro Abs: 4.1 10*3/uL (ref 1.7–7.7)
Neutrophils Relative %: 59 %
Platelet Count: 174 10*3/uL (ref 150–400)
RBC: 3.41 MIL/uL — ABNORMAL LOW (ref 3.87–5.11)
RDW: 16.7 % — ABNORMAL HIGH (ref 11.5–15.5)
WBC Count: 7.1 10*3/uL (ref 4.0–10.5)
nRBC: 0 % (ref 0.0–0.2)

## 2022-04-21 LAB — CEA (ACCESS): CEA (CHCC): 194.2 ng/mL — ABNORMAL HIGH (ref 0.00–5.00)

## 2022-04-21 LAB — TOTAL PROTEIN, URINE DIPSTICK: Protein, ur: NEGATIVE mg/dL

## 2022-04-21 MED ORDER — SODIUM CHLORIDE 0.9 % IV SOLN: Freq: Once | INTRAVENOUS | Status: AC

## 2022-04-21 MED ORDER — SODIUM CHLORIDE 0.9 % IV SOLN
7.5000 mg/kg | Freq: Once | INTRAVENOUS | Status: AC
  Administered 2022-04-21: 800 mg via INTRAVENOUS
  Filled 2022-04-21: qty 32

## 2022-04-21 MED ORDER — CAPECITABINE 500 MG PO TABS: 2000.0000 mg | ORAL_TABLET | Freq: Two times a day (BID) | ORAL | 2 refills | Status: DC

## 2022-04-21 MED ORDER — HEPARIN SOD (PORK) LOCK FLUSH 100 UNIT/ML IV SOLN
500.0000 [IU] | Freq: Once | INTRAVENOUS | Status: AC | PRN
  Administered 2022-04-21: 500 [IU]

## 2022-04-21 MED ORDER — SODIUM CHLORIDE 0.9% FLUSH
10.0000 mL | INTRAVENOUS | Status: DC | PRN
  Administered 2022-04-21: 10 mL

## 2022-04-21 NOTE — Telephone Encounter (Signed)
Summer from [employer] called   Re: Detailed Written Order   Order Rejected = Section B incomplete  Please complete Section B and resubmit  Fax:  [phone]

## 2022-04-21 NOTE — Patient Instructions (Signed)
[facility]  Discharge Instructions: Thank you for choosing [facility] to provide your oncology and hematology care.   If you have a lab appointment with the [facility], please go directly to the [facility] and check in at the registration area.   Wear comfortable clothing and clothing appropriate for easy access to any Portacath or PICC line.   We strive to give you quality time with your provider. You may need to reschedule your appointment if you arrive late (15 or more minutes).  Arriving late affects you and other patients whose appointments are after yours.  Also, if you miss three or more appointments without notifying the office, you may be dismissed from the clinic at the provider's discretion.      For prescription refill requests, have your pharmacy contact our office and allow 72 hours for refills to be completed.    Today you received the following chemotherapy and/or immunotherapy agents: Bevacizumab.       To help prevent nausea and vomiting after your treatment, we encourage you to take your nausea medication as directed.  BELOW ARE SYMPTOMS THAT SHOULD BE REPORTED IMMEDIATELY: *FEVER GREATER THAN 100.4 F (38 C) OR HIGHER *CHILLS OR SWEATING *NAUSEA AND VOMITING THAT IS NOT CONTROLLED WITH YOUR NAUSEA MEDICATION *UNUSUAL SHORTNESS OF BREATH *UNUSUAL BRUISING OR BLEEDING *URINARY PROBLEMS (pain or burning when urinating, or frequent urination) *BOWEL PROBLEMS (unusual diarrhea, constipation, pain near the anus) TENDERNESS IN MOUTH AND THROAT WITH OR WITHOUT PRESENCE OF ULCERS (sore throat, sores in mouth, or a toothache) UNUSUAL RASH, SWELLING OR PAIN  UNUSUAL VAGINAL DISCHARGE OR ITCHING   Items with * indicate a potential emergency and should be followed up as soon as possible or go to the Emergency Department if any problems should occur.  Please show the CHEMOTHERAPY ALERT CARD or IMMUNOTHERAPY ALERT CARD at check-in  to the Emergency Department and triage nurse.  Should you have questions after your visit or need to cancel or reschedule your appointment, please contact [facility]  Dept: [phone]  and follow the prompts.  Office hours are 8:00 a.m. to 4:30 p.m. Monday - Friday. Please note that voicemails left after 4:00 p.m. may not be returned until the following business day.  We are closed weekends and major holidays. You have access to a nurse at all times for urgent questions. Please call the main number to the clinic Dept: [phone] and follow the prompts.   For any non-urgent questions, you may also contact your provider using MyChart. We now offer e-Visits for anyone 27 and older to request care online for non-urgent symptoms. For details visit [email or web address].   Also download the MyChart app! Go to the app store, search "MyChart", open the app, select [facility], and log in with your MyChart username and password.

## 2022-04-21 NOTE — Telephone Encounter (Signed)
Form placed onto Dr Ledell Noss desk

## 2022-04-22 ENCOUNTER — Ambulatory Visit (HOSPITAL_COMMUNITY)
Admission: RE | Admit: 2022-04-22 | Discharge: 2022-04-22 | Disposition: A | Discharge status: Home / Self Care | Payer: Medicare HMO | Source: Ambulatory Visit | Attending: Hematology | Admitting: Hematology

## 2022-04-22 ENCOUNTER — Telehealth: Payer: Self-pay | Admitting: Hematology

## 2022-04-22 DIAGNOSIS — R600 Localized edema: Secondary | ICD-10-CM | POA: Diagnosis not present

## 2022-04-22 DIAGNOSIS — C787 Secondary malignant neoplasm of liver and intrahepatic bile duct: Secondary | ICD-10-CM

## 2022-04-22 DIAGNOSIS — C189 Malignant neoplasm of colon, unspecified: Secondary | ICD-10-CM | POA: Diagnosis not present

## 2022-04-22 NOTE — Telephone Encounter (Signed)
Spoke with patient confirming all appointments

## 2022-04-23 ENCOUNTER — Other Ambulatory Visit: Payer: Self-pay

## 2022-04-28 ENCOUNTER — Encounter: Payer: Self-pay | Admitting: Hematology

## 2022-04-28 DIAGNOSIS — Z933 Colostomy status: Secondary | ICD-10-CM | POA: Diagnosis not present

## 2022-04-28 DIAGNOSIS — S31109A Unspecified open wound of abdominal wall, unspecified quadrant without penetration into peritoneal cavity, initial encounter: Secondary | ICD-10-CM | POA: Diagnosis not present

## 2022-04-28 DIAGNOSIS — K56609 Unspecified intestinal obstruction, unspecified as to partial versus complete obstruction: Secondary | ICD-10-CM | POA: Diagnosis not present

## 2022-04-28 DIAGNOSIS — T8131XD Disruption of external operation (surgical) wound, not elsewhere classified, subsequent encounter: Secondary | ICD-10-CM | POA: Diagnosis not present

## 2022-05-04 ENCOUNTER — Other Ambulatory Visit: Payer: Self-pay | Admitting: Internal Medicine

## 2022-05-04 DIAGNOSIS — Z0000 Encounter for general adult medical examination without abnormal findings: Secondary | ICD-10-CM

## 2022-05-05 ENCOUNTER — Ambulatory Visit: Payer: Medicare Other | Admitting: Podiatry

## 2022-05-10 DIAGNOSIS — H524 Presbyopia: Secondary | ICD-10-CM | POA: Diagnosis not present

## 2022-05-11 NOTE — Assessment & Plan Note (Signed)
-  secondary to chemo -on gabapentin, overall mild and stable

## 2022-05-11 NOTE — Progress Notes (Signed)
[place]   [phone]) [phone] [phone]   Clinic Follow up Note   Patient Care Team: [name], [name], MD as PCP - General (Internal Medicine) Minus Breeding, MD as PCP - Cardiology (Cardiology) Clovis Riley, MD as Consulting Physician (General Surgery) Truitt Merle, MD as Consulting Physician (Hematology) [name], NP as Nurse Practitioner (Nurse Practitioner)  Date of Service:  05/12/2022  CHIEF COMPLAINT: f/u of  metastatic colon cancer    CURRENT THERAPY:  Maintenance Avastin and Xeloda, q3weeks, starting 01/21/20             -Xeloda dose: '2000mg'$  q12h on day 1-14  ASSESSMENT:  Casey Black is a 62 y.o. 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  -Started first line chemo on 03/12/2019, received 5FU/leuc with first 2 cycles for large open abdominal wound after surgery. She started full dose FOLFOX and avastin with cycle 2. Due to neuropathy Oxaliplatin was stopped after 09/24/19.  --For convenience she was switched to oral chemo Xeloda 2 weeks on/1 week off and Bev every 3 weeks in 01/21/20. She is tolerating well -Her restaging CT scan from April 16, 2022 showed stable liver metastasis, no other new lesions. -She is clinically doing well, will continue current therapy.  Hypertension -on hydralazine, isosorbide, lisinopril, amlodipine and HCTZ  -she monitors at home where it is normal -Will monitor on bevacizumab.   IDA (iron deficiency anemia) -She had low ferritin and low TIBC  -Takes oral iron BID, will continue. 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   PLAN: -lab reviewed - Cont. Xeloda, and OTC oral iron - proceed with C11 BEVA labs adequate for treatment. 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 Bowel-gas pattern consistent with small bowel obstruction. No free air. 2. No acute chest findings.   01/23/2019 Imaging   CT AP IMPRESSION: Obstructing mid transverse colonic mass with mild regional adenopathy and hepatic metastatic disease. The mass likely extends through the serosa; no ascites or peritoneal nodularity.   01/24/2019 Surgery   Surgeon: Clovis Riley MD Assistant: [name] Procedure performed: Transverse colectomy with end colostomy, liver biopsy Procedure classification: URGENT/EMERGENT Preop diagnosis: Obstructing, metastatic transverse colon mass Post-op diagnosis/intraop findings: Same   01/24/2019 Pathology Results   FINAL MICROSCOPIC DIAGNOSIS:   A. COLON, TRANSVERSE, RESECTION:  Colonic adenocarcinoma, 5 cm.  Carcinoma extends into pericolonic connective tissue and focally to  serosal surface.  Margins not involved.  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   B. LIVER NODULE, LEFT, BIOPSY:  Metastatic adenocarcinoma.    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 Hypermetabolic metastatic disease in the liver and mediastinal/hilar/axillary lymph nodes. 2. Focal hypermetabolism in the rectum. Continued attention on follow-up exams is warranted. 3. Focal hypermetabolism medial to the right adrenal gland may be within a metastatic lymph node, better visualized on 01/23/2019. 4. Aortic atherosclerosis (ICD10-170.0). Coronary artery calcification.   03/12/2019 -  Chemotherapy   She started 5FU q2weeks on 03/12/19 for 2 cycles. She started full dose FOLFOX with Avastin on 04/09/19. Oxaliplatin dose reduced repeatedly due to neuropathy C12 and held since C16 on 10/09/19. Now on maintenance Avastin and 5FU q2weeks since 10/09/19       -Maintenance change to maintenance xeloda 2000 mg BID days 1-14 q21 days and q3 weeks Zirabev (15 mg/kg) starting 01/21/20. First cycle was taken '1000mg'$  BID due to misunderstanding.    05/31/2019 Imaging   Restaging CT CAP IMPRESSION: 1. Similar to mild interval decrease in size of multiple hepatic lesions, partially calcified. 2. 2 mm right upper lobe pulmonary nodule. Recommend attention on follow-up. 3. Emphysema and aortic atherosclerosis.   08/23/2019 Imaging   CT CAP w contrast  IMPRESSION: 1. The dominant peripheral right liver metastasis has mildly increased. Other smaller liver metastases are stable. 2. Otherwise no new or progressive metastatic disease in the chest, abdomen or pelvis. 3. Aortic Atherosclerosis (ICD10-I70.0) and Emphysema (ICD10-J43.9).   11/27/2019 Imaging   CT CAP w contrast  IMPRESSION: Status post transverse colectomy with right mid abdominal colostomy.   Mildly progressive hepatic metastases, as above.   No evidence of metastatic disease in the chest. Small mediastinal lymph nodes are within normal limits.   Additional stable ancillary findings as above.   01/21/2020 -  Chemotherapy   Patient is on Treatment Plan : COLORECTAL Bevacizumab q21d      02/21/2020 Imaging   IMPRESSION: 1. Stable hepatic metastatic disease. 2. Aortic atherosclerosis (ICD10-I70.0). Coronary artery calcification. 3.  Emphysema (ICD10-J43.9).   05/19/2020 Imaging   CT CAP  IMPRESSION: 1. Multiple partially calcified liver metastases are again noted. With the exception of a small lesion in segment 7/8 lesions are not significantly changed in the interval. No new liver lesions identified. 2. Coronary artery atherosclerotic calcifications. 3. Aortic atherosclerosis. 4. 2 mm right upper lobe lung nodule identified.  Unchanged.   Aortic Atherosclerosis (ICD10-I70.0).   11/17/2020 Imaging   CT CAP  IMPRESSION: 1. Partially calcified lesions throughout the liver are stable accounting for differences in technique, contrasted imaging on today's study is compared to noncontrast imaging on the prior. 2. No new hepatic lesions. 3. Tiny 3 mm pulmonary nodule in the RIGHT upper lobe unchanged since the prior study. Attention on follow-up. 4. Mild fullness of RIGHT paratracheal nodal tissue is minimally increased and borderline enlarged, attention on follow-up. 5. RIGHT lower quadrant colostomy. 6. 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 7. Aortic atherosclerosis.   06/09/2021 Imaging   EXAM: CT CHEST, ABDOMEN, AND PELVIS WITH CONTRAST  IMPRESSION: Stable calcified liver metastases.   Stable mildly enlarged right paratracheal lymph node.   No evidence of new or progressive metastatic disease.   Aortic Atherosclerosis (ICD10-I70.0).   09/14/2021 PET scan   IMPRESSION: Multiple hypermetabolic and partially calcified liver metastases, without significant change compared to  most recent CT.   Mild hypermetabolic mediastinal and bilateral hilar lymphadenopathy, also without significant change.   No evidence of new or progressive  metastatic disease.     Electronically Signed   By: Marlaine Hind M.D.   On: 09/15/2021 11:04   04/15/2022 Imaging    IMPRESSION: 1. Several previously noted partially calcified hepatic metastases appear grossly stable compared to the prior examination. No new hepatic lesions or other signs of metastatic disease noted elsewhere in the chest, abdomen or pelvis. 2. Aortic atherosclerosis, in addition to left main and three-vessel coronary artery disease. Please note that although the presence of coronary artery calcium documents the presence of coronary artery disease, the severity of this disease and any potential stenosis cannot be assessed on this non-gated CT examination. Assessment for potential risk factor modification, dietary therapy or pharmacologic therapy may be warranted, if clinically indicated. 3. There are calcifications of the mitral annulus. Echocardiographic correlation for evaluation of potential valvular dysfunction may be warranted if clinically indicated. 4. Additional incidental findings, similar to prior studies, as above.      INTERVAL HISTORY:  Casey Black is here for a follow up of   metastatic colon cancer  She was last seen by me on 04/21/2022 She presents to the clinic alone. Pt reports everything is going well. Pt denies having any issues with BM, appetite, energy level. Pt states that she tries to keep her skin moisturize Pt has no concerns. Pt state that her Right  leg is a little swollen but with the wear of compression socks it helps minimize the swelling.     All other systems were reviewed with the patient and are negative.  MEDICAL HISTORY:  Past Medical History:  Diagnosis Date   Anemia    low iron   Colon cancer ([place]) 01/2019   Hypertension 01/23/2019   Personal history of chemotherapy 01/2019   colon CA   SBO (small bowel obstruction) ([place]) 01/23/2019    SURGICAL HISTORY: Past Surgical History:  Procedure Laterality Date    CESAREAN SECTION     x2   COLOSTOMY N/A 01/24/2019   Procedure: End Loop Colostomy;  Surgeon: Clovis Riley, MD;  Location: [facility];  Service: General;  Laterality: N/A;   PARTIAL COLECTOMY N/A 01/24/2019   Procedure: PARTIAL COLECTOMY;  Surgeon: Clovis Riley, MD;  Location: [facility];  Service: General;  Laterality: N/A;   PORTACATH PLACEMENT Right 02/28/2019   Procedure: INSERTION PORT-A-CATH WITH ULTRASOUND GUIDANCE;  Surgeon: Clovis Riley, MD;  Location: [facility];  Service: General;  Laterality: Right;    I have reviewed the social history and family history with the patient and they are unchanged from previous note.  ALLERGIES:  has No Known Allergies.  MEDICATIONS:  Current Outpatient Medications  Medication Sig Dispense Refill   acetaminophen (TYLENOL) 325 MG tablet Take 2 tablets (650 mg total) by mouth every 6 (six) hours as needed.     amLODipine (NORVASC) 10 MG tablet TAKE 1 TABLET BY MOUTH EVERY DAY 90 tablet 1   benzonatate (TESSALON) 100 MG capsule Take 1 capsule (100 mg total) by mouth every 8 (eight) hours. 21 capsule 0   capecitabine (XELODA) 500 MG tablet Take 4 tablets (2,000 mg total) by mouth 2 (two) times daily after a meal. Take for 14 days, then off for 7 days. 112 tablet 2   ferrous sulfate 325 (65 FE) MG tablet TAKE 1 TABLET BY MOUTH TWICE A DAY WITH FOOD 180 tablet 1  gabapentin (NEURONTIN) 300 MG capsule Take 1 capsule (300 mg total) by mouth 3 (three) times daily. 90 capsule 3   hydrALAZINE (APRESOLINE) 25 MG tablet TAKE 1 TABLET BY MOUTH EVERY 8 HOURS 270 tablet 0   hydrochlorothiazide (HYDRODIURIL) 12.5 MG tablet TAKE 1 TABLET BY MOUTH EVERY DAY 90 tablet 0   hydrocortisone (ANUSOL-HC) 2.5 % rectal cream Place 1 application rectally 2 (two) times daily. 30 g 0   Ibuprofen 200 MG CAPS Take 400 mg by mouth daily as needed (pain).     isosorbide mononitrate (IMDUR) 30 MG 24 hr tablet TAKE 1 TABLET BY MOUTH EVERY DAY 90 tablet 0   lidocaine (LMX) 4 % cream  Apply 1 application topically 3 (three) times daily as needed. 30 g 0   lidocaine-prilocaine (EMLA) cream Apply 1 application topically as needed. 30 g 1   lisinopril (ZESTRIL) 40 MG tablet TAKE 1 TABLET (40 MG TOTAL) BY MOUTH DAILY. SCHEDULE APPT FOR FUTURE REFILLS 90 tablet 0   polycarbophil (FIBERCON) 625 MG tablet Take 1 tablet (625 mg total) by mouth daily. 30 tablet 0   No current facility-administered medications for this visit.   Facility-Administered Medications Ordered in Other Visits  Medication Dose Route Frequency Provider Last Rate Last Admin   bevacizumab-adcd (VEGZELMA) 800 mg in sodium chloride 0.9 % 100 mL chemo infusion  7.5 mg/kg (Treatment Plan Recorded) Intravenous Once Truitt Merle, MD       heparin lock flush 100 unit/mL  500 Units Intracatheter Once PRN Truitt Merle, MD       sodium chloride flush (NS) 0.9 % injection 10 mL  10 mL Intracatheter PRN Truitt Merle, MD   10 mL at 06/06/20 1051   sodium chloride flush (NS) 0.9 % injection 10 mL  10 mL Intracatheter PRN Truitt Merle, MD        PHYSICAL EXAMINATION: ECOG PERFORMANCE STATUS: 0 - Asymptomatic  Vitals:   05/12/22 0848  BP: (!) 144/61  Pulse: 69  Resp: 15  Temp: 97.6 F (36.4 C)  SpO2: 100%   Wt Readings from Last 3 Encounters:  05/12/22 259 lb 4.8 oz (117.6 kg)  04/21/22 255 lb 8 oz (115.9 kg)  03/31/22 252 lb 12 oz (114.6 kg)     GENERAL:alert, no distress and comfortable SKIN: skin color normal, no rashes or significant lesions EYES: normal, Conjunctiva are pink and non-injected, sclera clear  NEURO: alert & oriented x 3 with fluent speech  LABORATORY DATA:  I have reviewed the data as listed    Latest Ref Rng & Units 05/12/2022    8:31 AM 04/21/2022   10:18 AM 03/31/2022    1:18 PM  CBC  WBC 4.0 - 10.5 K/uL 7.7  7.1  7.6   Hemoglobin 12.0 - 15.0 g/dL 11.3  11.6  11.2   Hematocrit 36.0 - 46.0 % 34.6  34.8  33.9   Platelets 150 - 400 K/uL 163  174  175         Latest Ref Rng & Units 05/12/2022     8:31 AM 04/21/2022   10:18 AM 03/31/2022    1:18 PM  CMP  Glucose 70 - 99 mg/dL 78  76  103   BUN 8 - 23 mg/dL 26  19  33   Creatinine 0.44 - 1.00 mg/dL 0.95  0.90  1.15   Sodium 135 - 145 mmol/L 137  134  135   Potassium 3.5 - 5.1 mmol/L 4.7  5.2  4.9   Chloride  98 - 111 mmol/L 111  109  111   CO2 22 - 32 mmol/L '22  21  21   '$ Calcium 8.9 - 10.3 mg/dL 9.6  9.8  9.7   Total Protein 6.5 - 8.1 g/dL 7.3  7.8  7.8   Total Bilirubin 0.3 - 1.2 mg/dL 0.5  0.4  0.4   Alkaline Phos 38 - 126 U/L 93  88  80   AST 15 - 41 U/L '15  18  17   '$ ALT 0 - 44 U/L '9  11  10       '$ RADIOGRAPHIC STUDIES: I have personally reviewed the radiological images as listed and agreed with the findings in the report. No results found.    Orders Placed This Encounter  Procedures   CBC with Differential ([place] Only)    Standing Status:   Future    Standing Expiration Date:   06/24/2023   Total Protein, Urine dipstick    Standing Status:   Future    Standing Expiration Date:   06/24/2023   All questions were answered. The patient knows to call the clinic with any problems, questions or concerns. No barriers to learning was detected. The total time spent in the appointment was 30 minutes.     Truitt Merle, MD 05/12/2022   Felicity Coyer, CMA, am acting as scribe for Truitt Merle, MD.   I have reviewed the above documentation for accuracy and completeness, and I agree with the above.

## 2022-05-11 NOTE — Assessment & Plan Note (Signed)
-  on hydralazine, isosorbide, lisinopril, amlodipine and HCTZ  -she monitors at home where it is normal -Will monitor on bevacizumab.

## 2022-05-11 NOTE — Assessment & Plan Note (Signed)
[id number] stage IV with liver and nodal metastasis, MSS, KRAS G12S(+)  -Diagnosed in 01/2019 after emergent colectomy and liver biopsy. 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  -Started first line chemo on 03/12/2019, received 5FU/leuc with first 2 cycles for large open abdominal wound after surgery. She started full dose FOLFOX and avastin with cycle 2. Due to neuropathy Oxaliplatin was stopped after 09/24/19.  --For convenience she was switched to oral chemo Xeloda 2 weeks on/1 week off and Bev every 3 weeks in 01/21/20. She is tolerating well -Her restaging CT scan from April 16, 2022 showed stable liver metastasis, no other new lesions. -She is clinically doing well, will continue current therapy.

## 2022-05-11 NOTE — Assessment & Plan Note (Signed)
-  She had low ferritin and low TIBC  -Takes oral iron BID, will continue. Will give IV iron if needed

## 2022-05-12 ENCOUNTER — Inpatient Hospital Stay (HOSPITAL_BASED_OUTPATIENT_CLINIC_OR_DEPARTMENT_OTHER): Payer: Medicare HMO | Admitting: Hematology

## 2022-05-12 ENCOUNTER — Encounter: Payer: Self-pay | Admitting: Hematology

## 2022-05-12 ENCOUNTER — Inpatient Hospital Stay: Payer: Medicare HMO

## 2022-05-12 ENCOUNTER — Other Ambulatory Visit: Payer: Self-pay

## 2022-05-12 ENCOUNTER — Inpatient Hospital Stay: Payer: Medicare HMO | Attending: Hematology

## 2022-05-12 VITALS — BP 144/61 | HR 69 | Temp 97.6°F | Resp 15 | Ht 67.0 in | Wt 259.3 lb

## 2022-05-12 VITALS — BP 137/59 | HR 60 | Temp 97.8°F | Resp 17

## 2022-05-12 DIAGNOSIS — C184 Malignant neoplasm of transverse colon: Secondary | ICD-10-CM | POA: Insufficient documentation

## 2022-05-12 DIAGNOSIS — T451X5A Adverse effect of antineoplastic and immunosuppressive drugs, initial encounter: Secondary | ICD-10-CM | POA: Diagnosis not present

## 2022-05-12 DIAGNOSIS — Z7189 Other specified counseling: Secondary | ICD-10-CM

## 2022-05-12 DIAGNOSIS — Z5112 Encounter for antineoplastic immunotherapy: Secondary | ICD-10-CM | POA: Insufficient documentation

## 2022-05-12 DIAGNOSIS — C189 Malignant neoplasm of colon, unspecified: Secondary | ICD-10-CM

## 2022-05-12 DIAGNOSIS — C787 Secondary malignant neoplasm of liver and intrahepatic bile duct: Secondary | ICD-10-CM | POA: Insufficient documentation

## 2022-05-12 DIAGNOSIS — Z79899 Other long term (current) drug therapy: Secondary | ICD-10-CM | POA: Diagnosis not present

## 2022-05-12 DIAGNOSIS — D500 Iron deficiency anemia secondary to blood loss (chronic): Secondary | ICD-10-CM | POA: Diagnosis not present

## 2022-05-12 DIAGNOSIS — G620 Drug-induced polyneuropathy: Secondary | ICD-10-CM

## 2022-05-12 DIAGNOSIS — I10 Essential (primary) hypertension: Secondary | ICD-10-CM | POA: Diagnosis not present

## 2022-05-12 DIAGNOSIS — Z95828 Presence of other vascular implants and grafts: Secondary | ICD-10-CM

## 2022-05-12 LAB — CBC WITH DIFFERENTIAL (CANCER CENTER ONLY)
Abs Immature Granulocytes: 0.02 10*3/uL (ref 0.00–0.07)
Basophils Absolute: 0.1 10*3/uL (ref 0.0–0.1)
Basophils Relative: 1 %
Eosinophils Absolute: 0.3 10*3/uL (ref 0.0–0.5)
Eosinophils Relative: 3 %
HCT: 34.6 % — ABNORMAL LOW (ref 36.0–46.0)
Hemoglobin: 11.3 g/dL — ABNORMAL LOW (ref 12.0–15.0)
Immature Granulocytes: 0 %
Lymphocytes Relative: 25 %
Lymphs Abs: 1.9 10*3/uL (ref 0.7–4.0)
MCH: 34.3 pg — ABNORMAL HIGH (ref 26.0–34.0)
MCHC: 32.7 g/dL (ref 30.0–36.0)
MCV: 105.2 fL — ABNORMAL HIGH (ref 80.0–100.0)
Monocytes Absolute: 0.6 10*3/uL (ref 0.1–1.0)
Monocytes Relative: 8 %
Neutro Abs: 4.9 10*3/uL (ref 1.7–7.7)
Neutrophils Relative %: 63 %
Platelet Count: 163 10*3/uL (ref 150–400)
RBC: 3.29 MIL/uL — ABNORMAL LOW (ref 3.87–5.11)
RDW: 16.6 % — ABNORMAL HIGH (ref 11.5–15.5)
WBC Count: 7.7 10*3/uL (ref 4.0–10.5)
nRBC: 0 % (ref 0.0–0.2)

## 2022-05-12 LAB — COMPREHENSIVE METABOLIC PANEL
ALT: 9 U/L (ref 0–44)
AST: 15 U/L (ref 15–41)
Albumin: 3.8 g/dL (ref 3.5–5.0)
Alkaline Phosphatase: 93 U/L (ref 38–126)
Anion gap: 4 — ABNORMAL LOW (ref 5–15)
BUN: 26 mg/dL — ABNORMAL HIGH (ref 8–23)
CO2: 22 mmol/L (ref 22–32)
Calcium: 9.6 mg/dL (ref 8.9–10.3)
Chloride: 111 mmol/L (ref 98–111)
Creatinine, Ser: 0.95 mg/dL (ref 0.44–1.00)
GFR, Estimated: >60 mL/min (ref >60)
Glucose, Bld: 78 mg/dL (ref 70–99)
Potassium: 4.7 mmol/L (ref 3.5–5.1)
Sodium: 137 mmol/L (ref 135–145)
Total Bilirubin: 0.5 mg/dL (ref 0.3–1.2)
Total Protein: 7.3 g/dL (ref 6.5–8.1)

## 2022-05-12 LAB — TOTAL PROTEIN, URINE DIPSTICK: Protein, ur: <30 mg/dL — AB

## 2022-05-12 MED ORDER — SODIUM CHLORIDE 0.9% FLUSH
10.0000 mL | INTRAVENOUS | Status: DC | PRN
  Administered 2022-05-12: 10 mL

## 2022-05-12 MED ORDER — SODIUM CHLORIDE 0.9 % IV SOLN: Freq: Once | INTRAVENOUS | Status: AC

## 2022-05-12 MED ORDER — SODIUM CHLORIDE 0.9 % IV SOLN
7.5000 mg/kg | Freq: Once | INTRAVENOUS | Status: AC
  Administered 2022-05-12: 800 mg via INTRAVENOUS
  Filled 2022-05-12: qty 32

## 2022-05-12 MED ORDER — HEPARIN SOD (PORK) LOCK FLUSH 100 UNIT/ML IV SOLN
500.0000 [IU] | Freq: Once | INTRAVENOUS | Status: AC | PRN
  Administered 2022-05-12: 500 [IU]

## 2022-05-12 NOTE — Patient Instructions (Signed)
[facility]  Discharge Instructions: Thank you for choosing [facility] to provide your oncology and hematology care.   If you have a lab appointment with the Cancer Center, please go directly to the Cancer Center and check in at the registration area.   Wear comfortable clothing and clothing appropriate for easy access to any Portacath or PICC line.   We strive to give you quality time with your provider. You may need to reschedule your appointment if you arrive late (15 or more minutes).  Arriving late affects you and other patients whose appointments are after yours.  Also, if you miss three or more appointments without notifying the office, you may be dismissed from the clinic at the provider's discretion.      For prescription refill requests, have your pharmacy contact our office and allow 72 hours for refills to be completed.    Today you received the following chemotherapy and/or immunotherapy agents: bevacizumab (Vegzelma)  To help prevent nausea and vomiting after your treatment, we encourage you to take your nausea medication as directed.  BELOW ARE SYMPTOMS THAT SHOULD BE REPORTED IMMEDIATELY: *FEVER GREATER THAN 100.4 F (38 C) OR HIGHER *CHILLS OR SWEATING *NAUSEA AND VOMITING THAT IS NOT CONTROLLED WITH YOUR NAUSEA MEDICATION *UNUSUAL SHORTNESS OF BREATH *UNUSUAL BRUISING OR BLEEDING *URINARY PROBLEMS (pain or burning when urinating, or frequent urination) *BOWEL PROBLEMS (unusual diarrhea, constipation, pain near the anus) TENDERNESS IN MOUTH AND THROAT WITH OR WITHOUT PRESENCE OF ULCERS (sore throat, sores in mouth, or a toothache) UNUSUAL RASH, SWELLING OR PAIN  UNUSUAL VAGINAL DISCHARGE OR ITCHING   Items with * indicate a potential emergency and should be followed up as soon as possible or go to the Emergency Department if any problems should occur.  Please show the CHEMOTHERAPY ALERT CARD or IMMUNOTHERAPY ALERT CARD  at check-in to the Emergency Department and triage nurse.  Should you have questions after your visit or need to cancel or reschedule your appointment, please contact [facility]  Dept: [phone]  and follow the prompts.  Office hours are 8:00 a.m. to 4:30 p.m. Monday - Friday. Please note that voicemails left after 4:00 p.m. may not be returned until the following business day.  We are closed weekends and major holidays. You have access to a nurse at all times for urgent questions. Please call the main number to the clinic Dept: [phone] and follow the prompts.   For any non-urgent questions, you may also contact your provider using MyChart. We now offer e-Visits for anyone 18 and older to request care online for non-urgent symptoms. For details visit [email or web address].   Also download the MyChart app! Go to the app store, search "MyChart", open the app, select , and log in with your MyChart username and password.  

## 2022-05-13 ENCOUNTER — Other Ambulatory Visit: Payer: Self-pay

## 2022-05-17 ENCOUNTER — Other Ambulatory Visit: Payer: Self-pay | Admitting: Internal Medicine

## 2022-05-17 DIAGNOSIS — I10 Essential (primary) hypertension: Secondary | ICD-10-CM

## 2022-05-19 ENCOUNTER — Encounter: Payer: Self-pay | Admitting: Podiatry

## 2022-05-19 ENCOUNTER — Ambulatory Visit: Payer: Medicare HMO | Admitting: Podiatry

## 2022-05-19 VITALS — BP 166/68 | HR 83

## 2022-05-19 DIAGNOSIS — M79674 Pain in right toe(s): Secondary | ICD-10-CM

## 2022-05-19 DIAGNOSIS — T451X5A Adverse effect of antineoplastic and immunosuppressive drugs, initial encounter: Secondary | ICD-10-CM

## 2022-05-19 DIAGNOSIS — M79675 Pain in left toe(s): Secondary | ICD-10-CM

## 2022-05-19 DIAGNOSIS — B351 Tinea unguium: Secondary | ICD-10-CM

## 2022-05-19 DIAGNOSIS — G620 Drug-induced polyneuropathy: Secondary | ICD-10-CM

## 2022-05-19 NOTE — Progress Notes (Signed)
This patient returns to my office for at risk foot care.  This patient requires this care by a professional since this patient will be at risk due to having neuropathy due to cancer treatment.  Patient is presently experiencing peeling in her bottom of both feet.  This patient is unable to cut nails herself since the patient cannot reach her nails.These nails are painful walking and wearing shoes.  This patient presents for at risk foot care today.  General Appearance  Alert, conversant and in no acute stress.  Vascular  Dorsalis pedis and posterior tibial  pulses are weakly  palpable  bilaterally.  Capillary return is within normal limits  bilaterally. Temperature is within normal limits  bilaterally.  Neurologic  Senn-Weinstein monofilament wire test diminished   bilaterally. Muscle power within normal limits bilaterally.  Nails Thick disfigured discolored nails with subungual debris  from hallux to fifth toes bilaterally. No evidence of bacterial infection or drainage bilaterally.  Orthopedic  No limitations of motion  feet .  No crepitus or effusions noted.  No bony pathology or digital deformities noted.  Skin  normotropic skin noted bilaterally.  No signs of infections or ulcers noted.  Asymptomatic porokeratosis sub 4th met left foot.  Desquamation noted plantar aspect feet  B/L.   Onychomycosis  Pain in right toes  Pain in left toes  Consent was obtained for treatment procedures.   Mechanical debridement of nails 1-5  bilaterally performed with a nail nipper.  Filed with dremel without incident.    Return office visit   4 months                   Told patient to return for periodic foot care and evaluation due to potential at risk complications.   Kellene Mccleary DPM  

## 2022-05-20 ENCOUNTER — Other Ambulatory Visit: Payer: Self-pay

## 2022-06-01 NOTE — Progress Notes (Unsigned)
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 Bowel-gas pattern consistent with small bowel obstruction. No free air. 2. No acute chest findings.   01/23/2019 Imaging   CT AP IMPRESSION: Obstructing mid transverse colonic mass with mild regional adenopathy and hepatic metastatic disease. The mass likely extends through the serosa; no ascites or peritoneal nodularity.   01/24/2019 Surgery   Surgeon: Clovis Riley MD Assistant: [name] Procedure performed: Transverse colectomy with end colostomy, liver biopsy Procedure classification: URGENT/EMERGENT Preop diagnosis: Obstructing, metastatic transverse colon mass Post-op diagnosis/intraop findings: Same   01/24/2019 Pathology Results   FINAL MICROSCOPIC DIAGNOSIS:   A. COLON, TRANSVERSE, RESECTION:  Colonic adenocarcinoma, 5 cm.  Carcinoma extends into pericolonic connective tissue and focally to  serosal surface.  Margins not involved.  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   B. LIVER NODULE, LEFT, BIOPSY:  Metastatic adenocarcinoma.    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 Hypermetabolic metastatic disease in the liver and mediastinal/hilar/axillary lymph nodes. 2. Focal hypermetabolism in the rectum. Continued attention on follow-up exams is warranted. 3. Focal hypermetabolism medial to the right adrenal gland may be within a metastatic lymph node, better visualized on 01/23/2019. 4. Aortic atherosclerosis (ICD10-170.0). Coronary artery calcification.   03/12/2019 -  Chemotherapy   She started 5FU q2weeks on 03/12/19 for 2 cycles. She started full dose FOLFOX with Avastin on 04/09/19. Oxaliplatin dose reduced repeatedly due to neuropathy C12 and held since C16 on 10/09/19. Now on maintenance Avastin and 5FU q2weeks since 10/09/19       -Maintenance change to maintenance xeloda 2000 mg BID days 1-14 q21 days and q3 weeks Zirabev (15 mg/kg) starting 01/21/20. First cycle was taken '1000mg'$  BID due to misunderstanding.    05/31/2019 Imaging   Restaging CT CAP IMPRESSION: 1. Similar to mild interval decrease in size of multiple hepatic lesions, partially calcified. 2. 2 mm right upper lobe pulmonary nodule. Recommend attention on follow-up. 3. Emphysema and aortic atherosclerosis.   08/23/2019 Imaging   CT CAP w contrast  IMPRESSION: 1. The dominant peripheral right liver metastasis has mildly increased. Other smaller liver metastases are stable. 2. Otherwise no new or progressive metastatic disease in the chest, abdomen or pelvis. 3. Aortic Atherosclerosis (ICD10-I70.0) and Emphysema (ICD10-J43.9).   11/27/2019 Imaging   CT CAP w contrast  IMPRESSION: Status post transverse colectomy with right mid abdominal colostomy.   Mildly progressive hepatic metastases, as above.   No evidence of metastatic disease in the chest. Small mediastinal lymph nodes are within normal limits.   Additional stable  ancillary findings as  above.   01/21/2020 -  Chemotherapy   Patient is on Treatment Plan : COLORECTAL Bevacizumab q21d     02/21/2020 Imaging   IMPRESSION: 1. Stable hepatic metastatic disease. 2. Aortic atherosclerosis (ICD10-I70.0). Coronary artery calcification. 3.  Emphysema (ICD10-J43.9).   05/19/2020 Imaging   CT CAP  IMPRESSION: 1. Multiple partially calcified liver metastases are again noted. With the exception of a small lesion in segment 7/8 lesions are not significantly changed in the interval. No new liver lesions identified. 2. Coronary artery atherosclerotic calcifications. 3. Aortic atherosclerosis. 4. 2 mm right upper lobe lung nodule identified.  Unchanged.   Aortic Atherosclerosis (ICD10-I70.0).   11/17/2020 Imaging   CT CAP  IMPRESSION: 1. Partially calcified lesions throughout the liver are stable accounting for differences in technique, contrasted imaging on today's study is compared to noncontrast imaging on the prior. 2. No new hepatic lesions. 3. Tiny 3 mm pulmonary nodule in the RIGHT upper lobe unchanged since the prior study. Attention on follow-up. 4. Mild fullness of RIGHT paratracheal nodal tissue is minimally increased and borderline enlarged, attention on follow-up. 5. RIGHT lower quadrant colostomy. 6. 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 7. Aortic atherosclerosis.   06/09/2021 Imaging   EXAM: CT CHEST, ABDOMEN, AND PELVIS WITH CONTRAST  IMPRESSION: Stable calcified liver metastases.   Stable mildly enlarged right paratracheal lymph node.   No evidence of new or progressive metastatic disease.   Aortic Atherosclerosis (ICD10-I70.0).   09/14/2021 PET scan   IMPRESSION: Multiple hypermetabolic and partially calcified liver metastases, without significant change compared to most recent CT.   Mild hypermetabolic mediastinal and  bilateral hilar lymphadenopathy, also without significant change.   No evidence of new or progressive metastatic disease.     Electronically Signed   By: Marlaine Hind M.D.   On: 09/15/2021 11:04   04/15/2022 Imaging    IMPRESSION: 1. Several previously noted partially calcified hepatic metastases appear grossly stable compared to the prior examination. No new hepatic lesions or other signs of metastatic disease noted elsewhere in the chest, abdomen or pelvis. 2. Aortic atherosclerosis, in addition to left main and three-vessel coronary artery disease. Please note that although the presence of coronary artery calcium documents the presence of coronary artery disease, the severity of this disease and any potential stenosis cannot be assessed on this non-gated CT examination. Assessment for potential risk factor modification, dietary therapy or pharmacologic therapy may be warranted, if clinically indicated. 3. There are calcifications of the mitral annulus. Echocardiographic correlation for evaluation of potential valvular dysfunction may be warranted if clinically indicated. 4. Additional incidental findings, similar to prior studies, as above.      CURRENT THERAPY:  Maintenance Avastin and Xeloda, q3weeks, starting 01/21/20             -Xeloda dose: '2000mg'$  q12h on day 1-14  INTERVAL HISTORY Casey Black returns for follow up and treatment as scheduled. Last seen by Dr. Burr Medico 05/12/22. She is currently in the middle of a cycle and has enough medications until 06/04/2022. She is waiting for her refill to arrive from speciality pharmacy next week.   Casey Black is doing well and tolerating treatment. She reports her appetite and energy levels are stable. She is able to complete her ADLs on her own. She denies nausea, vomiting or abdominal pain. Her bowel habits are unchanged without recurrent episodes of diarrhea or  constipation. She denies easy bruising or signs of active bleeding. She still  has persistent neuropathy in her feet that can affect her balance. She has dry skin involving her hands with dark pigmentation. She denies any peeling or pain in her hands. She denies fevers, chills, sweats, shortness of breath, chest pain or cough. She has no other complaints.   Rest of the 10 point ROS was reviewed and negative.    Past Medical History:  Diagnosis Date   Anemia    low iron   Colon cancer ([place]) 01/2019   Hypertension 01/23/2019   Personal history of chemotherapy 01/2019   colon CA   SBO (small bowel obstruction) ([place]) 01/23/2019     Past Surgical History:  Procedure Laterality Date   CESAREAN SECTION     x2   COLOSTOMY N/A 01/24/2019   Procedure: End Loop Colostomy;  Surgeon: Clovis Riley, MD;  Location: [facility];  Service: General;  Laterality: N/A;   PARTIAL COLECTOMY N/A 01/24/2019   Procedure: PARTIAL COLECTOMY;  Surgeon: Clovis Riley, MD;  Location: [facility];  Service: General;  Laterality: N/A;   PORTACATH PLACEMENT Right 02/28/2019   Procedure: INSERTION PORT-A-CATH WITH ULTRASOUND GUIDANCE;  Surgeon: Clovis Riley, MD;  Location: [facility];  Service: General;  Laterality: Right;     Outpatient Encounter Medications as of 06/02/2022  Medication Sig   acetaminophen (TYLENOL) 325 MG tablet Take 2 tablets (650 mg total) by mouth every 6 (six) hours as needed.   amLODipine (NORVASC) 10 MG tablet TAKE 1 TABLET BY MOUTH EVERY DAY   benzonatate (TESSALON) 100 MG capsule Take 1 capsule (100 mg total) by mouth every 8 (eight) hours.   capecitabine (XELODA) 500 MG tablet Take 4 tablets (2,000 mg total) by mouth 2 (two) times daily after a meal. Take for 14 days, then off for 7 days.   ferrous sulfate 325 (65 FE) MG tablet TAKE 1 TABLET BY MOUTH TWICE A DAY WITH FOOD   gabapentin (NEURONTIN) 300 MG capsule Take 1 capsule (300 mg total) by mouth 3 (three) times daily.   hydrALAZINE (APRESOLINE) 25 MG tablet TAKE 1 TABLET BY MOUTH EVERY 8 HOURS   hydrochlorothiazide  (HYDRODIURIL) 12.5 MG tablet TAKE 1 TABLET BY MOUTH EVERY DAY   hydrocortisone (ANUSOL-HC) 2.5 % rectal cream Place 1 application rectally 2 (two) times daily.   Ibuprofen 200 MG CAPS Take 400 mg by mouth daily as needed (pain).   isosorbide mononitrate (IMDUR) 30 MG 24 hr tablet TAKE 1 TABLET BY MOUTH EVERY DAY   lidocaine (LMX) 4 % cream Apply 1 application topically 3 (three) times daily as needed.   lidocaine-prilocaine (EMLA) cream APPLY 1 APPLICATION TOPICALLY AS NEEDED.   lisinopril (ZESTRIL) 40 MG tablet TAKE 1 TABLET (40 MG TOTAL) BY MOUTH DAILY. SCHEDULE APPT FOR FUTURE REFILLS   polycarbophil (FIBERCON) 625 MG tablet Take 1 tablet (625 mg total) by mouth daily.   [DISCONTINUED] lidocaine-prilocaine (EMLA) cream Apply 1 application topically as needed.   [DISCONTINUED] prochlorperazine (COMPAZINE) 10 MG tablet Take 1 tablet (10 mg total) by mouth every 6 (six) hours as needed (Nausea or vomiting).   Facility-Administered Encounter Medications as of 06/02/2022  Medication   sodium chloride flush (NS) 0.9 % injection 10 mL   [DISCONTINUED] sodium chloride flush (NS) 0.9 % injection 10 mL     Today's Vitals   06/02/22 1120 06/02/22 1129  BP: (!) 150/55   Pulse: 70   Resp: 17   Temp: 97.8 F (  36.6 C)   TempSrc: Temporal   SpO2: 100%   Weight: 257 lb 12.8 oz (116.9 kg)   Height: '5\' 7"'$  (1.702 m)   PainSc:  0-No pain   Body mass index is 40.38 kg/m.   PHYSICAL EXAM GENERAL:alert, no distress and comfortable SKIN: no rash. Hyperpigmentation involving her hands which are dry but no peeling.  EYES: sclera clear NECK: without mass LYMPH:  no palpable cervical or supraclavicular lymphadenopathy  LUNGS: clear with normal breathing effort HEART: regular rate & rhythm, no lower extremity edema NEURO: alert & oriented x 3 with fluent speech, no focal motor/sensory deficits  CBC    Component Value Date/Time   WBC 8.6 06/02/2022 1038   WBC 7.9 12/24/2021 1502   RBC 3.23 (L)  06/02/2022 1038   HGB 11.2 (L) 06/02/2022 1038   HCT 33.6 (L) 06/02/2022 1038   PLT 194 06/02/2022 1038   MCV 104.0 (H) 06/02/2022 1038   MCH 34.7 (H) 06/02/2022 1038   MCHC 33.3 06/02/2022 1038   RDW 16.7 (H) 06/02/2022 1038   LYMPHSABS 2.0 06/02/2022 1038   MONOABS 0.6 06/02/2022 1038   EOSABS 0.2 06/02/2022 1038   BASOSABS 0.0 06/02/2022 1038     CMP     Component Value Date/Time   NA 137 06/02/2022 1038   K 4.5 06/02/2022 1038   CL 111 06/02/2022 1038   CO2 21 (L) 06/02/2022 1038   GLUCOSE 122 (H) 06/02/2022 1038   BUN 24 (H) 06/02/2022 1038   CREATININE 0.99 06/02/2022 1038   CREATININE 0.95 01/06/2022 0944   CALCIUM 9.1 06/02/2022 1038   PROT 7.0 06/02/2022 1038   ALBUMIN 3.8 06/02/2022 1038   AST 18 06/02/2022 1038   AST 15 01/06/2022 0944   ALT 10 06/02/2022 1038   ALT 8 01/06/2022 0944   ALKPHOS 88 06/02/2022 1038   BILITOT 0.5 06/02/2022 1038   BILITOT 0.6 01/06/2022 0944   GFRNONAA >60 06/02/2022 1038   GFRNONAA >60 01/06/2022 0944   GFRAA >60 01/07/2020 0957     ASSESSMENT:  Casey Black is a 62 y.o. 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  -Started first line chemo on 03/12/2019, received 5FU/leuc with first 2 cycles for large open abdominal wound after surgery. She started full dose FOLFOX and avastin with cycle 2. Due to neuropathy Oxaliplatin was stopped after 09/24/19.  --For convenience she was switched to oral chemo Xeloda 2 weeks on/1 week off and Bev every 3 weeks in 01/21/20. She is tolerating well -Most recent restaging CT scan from April 16, 2022 showed stable liver metastasis, no other new lesions.  #Hyperpigmentation/Dry Skin of Hands: --Encouraged to moisturize her hands, suggested simply udder  cream.  Hypertension -on hydralazine, isosorbide, lisinopril, amlodipine and HCTZ  -she monitors at home where it is normal -Will monitor on bevacizumab.    IDA (iron deficiency anemia) -She had low ferritin and low TIBC  -Takes oral iron BID, will continue. 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     PLAN: -Due for another dose of bevacizumab. -Labs from today were reviewed  and adequate for treatment. WBC 8.6, Hgb 11.2, MCV 104.0, Plt 194, Creatinine and LFTs normal.  -Proceed with treatment today without any dose modifications. Patient will continue on Xeloda without any dose modifications.  -RTC in 3 weeks with labs and follow up with Dr. Burr Medico before next dose of bevacizumab.    Orders Placed This Encounter  Procedures   CEA (Access)-[facility] ONLY    Standing Status:   Future    Standing Expiration Date:   06/03/2023     Patient expressed understanding of the plan provided. She is aware to call the clinic if she has any questions or concerns.   I have spent a total of 30 minutes minutes of face-to-face and non-face-to-face time, preparing to see the patient,  performing a medically appropriate examination, counseling and educating the patient, documenting clinical information in the electronic health record, and care coordination.   Casey Query PA-C Dept of Hematology and [place] at [facility] Phone: [phone]

## 2022-06-02 ENCOUNTER — Inpatient Hospital Stay: Payer: Medicare HMO

## 2022-06-02 ENCOUNTER — Other Ambulatory Visit: Payer: Self-pay

## 2022-06-02 ENCOUNTER — Other Ambulatory Visit: Payer: Self-pay | Admitting: Hematology

## 2022-06-02 ENCOUNTER — Inpatient Hospital Stay (HOSPITAL_BASED_OUTPATIENT_CLINIC_OR_DEPARTMENT_OTHER): Payer: Medicare HMO | Admitting: Physician Assistant

## 2022-06-02 VITALS — BP 150/55 | HR 70 | Temp 97.8°F | Resp 17 | Ht 67.0 in | Wt 257.8 lb

## 2022-06-02 VITALS — BP 142/62 | HR 58 | Temp 98.4°F | Resp 16

## 2022-06-02 DIAGNOSIS — Z7189 Other specified counseling: Secondary | ICD-10-CM | POA: Diagnosis not present

## 2022-06-02 DIAGNOSIS — C184 Malignant neoplasm of transverse colon: Secondary | ICD-10-CM | POA: Diagnosis not present

## 2022-06-02 DIAGNOSIS — Z79899 Other long term (current) drug therapy: Secondary | ICD-10-CM | POA: Diagnosis not present

## 2022-06-02 DIAGNOSIS — C189 Malignant neoplasm of colon, unspecified: Secondary | ICD-10-CM

## 2022-06-02 DIAGNOSIS — Z95828 Presence of other vascular implants and grafts: Secondary | ICD-10-CM

## 2022-06-02 DIAGNOSIS — C787 Secondary malignant neoplasm of liver and intrahepatic bile duct: Secondary | ICD-10-CM | POA: Diagnosis not present

## 2022-06-02 DIAGNOSIS — Z5112 Encounter for antineoplastic immunotherapy: Secondary | ICD-10-CM | POA: Diagnosis not present

## 2022-06-02 LAB — CBC WITH DIFFERENTIAL (CANCER CENTER ONLY)
Abs Immature Granulocytes: 0.02 10*3/uL (ref 0.00–0.07)
Basophils Absolute: 0 10*3/uL (ref 0.0–0.1)
Basophils Relative: 0 %
Eosinophils Absolute: 0.2 10*3/uL (ref 0.0–0.5)
Eosinophils Relative: 3 %
HCT: 33.6 % — ABNORMAL LOW (ref 36.0–46.0)
Hemoglobin: 11.2 g/dL — ABNORMAL LOW (ref 12.0–15.0)
Immature Granulocytes: 0 %
Lymphocytes Relative: 24 %
Lymphs Abs: 2 10*3/uL (ref 0.7–4.0)
MCH: 34.7 pg — ABNORMAL HIGH (ref 26.0–34.0)
MCHC: 33.3 g/dL (ref 30.0–36.0)
MCV: 104 fL — ABNORMAL HIGH (ref 80.0–100.0)
Monocytes Absolute: 0.6 10*3/uL (ref 0.1–1.0)
Monocytes Relative: 7 %
Neutro Abs: 5.7 10*3/uL (ref 1.7–7.7)
Neutrophils Relative %: 66 %
Platelet Count: 194 10*3/uL (ref 150–400)
RBC: 3.23 MIL/uL — ABNORMAL LOW (ref 3.87–5.11)
RDW: 16.7 % — ABNORMAL HIGH (ref 11.5–15.5)
WBC Count: 8.6 10*3/uL (ref 4.0–10.5)
nRBC: 0 % (ref 0.0–0.2)

## 2022-06-02 LAB — COMPREHENSIVE METABOLIC PANEL
ALT: 10 U/L (ref 0–44)
AST: 18 U/L (ref 15–41)
Albumin: 3.8 g/dL (ref 3.5–5.0)
Alkaline Phosphatase: 88 U/L (ref 38–126)
Anion gap: 5 (ref 5–15)
BUN: 24 mg/dL — ABNORMAL HIGH (ref 8–23)
CO2: 21 mmol/L — ABNORMAL LOW (ref 22–32)
Calcium: 9.1 mg/dL (ref 8.9–10.3)
Chloride: 111 mmol/L (ref 98–111)
Creatinine, Ser: 0.99 mg/dL (ref 0.44–1.00)
GFR, Estimated: >60 mL/min (ref >60)
Glucose, Bld: 122 mg/dL — ABNORMAL HIGH (ref 70–99)
Potassium: 4.5 mmol/L (ref 3.5–5.1)
Sodium: 137 mmol/L (ref 135–145)
Total Bilirubin: 0.5 mg/dL (ref 0.3–1.2)
Total Protein: 7 g/dL (ref 6.5–8.1)

## 2022-06-02 LAB — TOTAL PROTEIN, URINE DIPSTICK: Protein, ur: NEGATIVE mg/dL

## 2022-06-02 MED ORDER — SODIUM CHLORIDE 0.9% FLUSH
10.0000 mL | INTRAVENOUS | Status: DC | PRN
  Administered 2022-06-02: 10 mL

## 2022-06-02 MED ORDER — SODIUM CHLORIDE 0.9 % IV SOLN: Freq: Once | INTRAVENOUS | Status: AC

## 2022-06-02 MED ORDER — HEPARIN SOD (PORK) LOCK FLUSH 100 UNIT/ML IV SOLN
500.0000 [IU] | Freq: Once | INTRAVENOUS | Status: AC | PRN
  Administered 2022-06-02: 500 [IU]

## 2022-06-02 MED ORDER — SODIUM CHLORIDE 0.9 % IV SOLN
7.5000 mg/kg | Freq: Once | INTRAVENOUS | Status: AC
  Administered 2022-06-02: 800 mg via INTRAVENOUS
  Filled 2022-06-02: qty 32

## 2022-06-02 NOTE — Patient Instructions (Signed)
[facility]  Discharge Instructions: Thank you for choosing [facility] to provide your oncology and hematology care.   If you have a lab appointment with the [facility], please go directly to the [facility] and check in at the registration area.   Wear comfortable clothing and clothing appropriate for easy access to any Portacath or PICC line.   We strive to give you quality time with your provider. You may need to reschedule your appointment if you arrive late (15 or more minutes).  Arriving late affects you and other patients whose appointments are after yours.  Also, if you miss three or more appointments without notifying the office, you may be dismissed from the clinic at the provider's discretion.      For prescription refill requests, have your pharmacy contact our office and allow 72 hours for refills to be completed.    Today you received the following chemotherapy and/or immunotherapy agents: bevacizumab (Vegzelma)  To help prevent nausea and vomiting after your treatment, we encourage you to take your nausea medication as directed.  BELOW ARE SYMPTOMS THAT SHOULD BE REPORTED IMMEDIATELY: *FEVER GREATER THAN 100.4 F (38 C) OR HIGHER *CHILLS OR SWEATING *NAUSEA AND VOMITING THAT IS NOT CONTROLLED WITH YOUR NAUSEA MEDICATION *UNUSUAL SHORTNESS OF BREATH *UNUSUAL BRUISING OR BLEEDING *URINARY PROBLEMS (pain or burning when urinating, or frequent urination) *BOWEL PROBLEMS (unusual diarrhea, constipation, pain near the anus) TENDERNESS IN MOUTH AND THROAT WITH OR WITHOUT PRESENCE OF ULCERS (sore throat, sores in mouth, or a toothache) UNUSUAL RASH, SWELLING OR PAIN  UNUSUAL VAGINAL DISCHARGE OR ITCHING   Items with * indicate a potential emergency and should be followed up as soon as possible or go to the Emergency Department if any problems should occur.  Please show the CHEMOTHERAPY ALERT CARD or IMMUNOTHERAPY ALERT CARD  at check-in to the Emergency Department and triage nurse.  Should you have questions after your visit or need to cancel or reschedule your appointment, please contact [facility]  Dept: [phone]  and follow the prompts.  Office hours are 8:00 a.m. to 4:30 p.m. Monday - Friday. Please note that voicemails left after 4:00 p.m. may not be returned until the following business day.  We are closed weekends and major holidays. You have access to a nurse at all times for urgent questions. Please call the main number to the clinic Dept: [phone] and follow the prompts.   For any non-urgent questions, you may also contact your provider using MyChart. We now offer e-Visits for anyone 97 and older to request care online for non-urgent symptoms. For details visit [email or web address].   Also download the MyChart app! Go to the app store, search "MyChart", open the app, select [facility], and log in with your MyChart username and password.

## 2022-06-03 ENCOUNTER — Encounter: Payer: Self-pay | Admitting: Hematology

## 2022-06-08 ENCOUNTER — Encounter: Payer: Self-pay | Admitting: Hematology

## 2022-06-08 NOTE — Telephone Encounter (Signed)
Open error 

## 2022-06-10 DIAGNOSIS — Z933 Colostomy status: Secondary | ICD-10-CM | POA: Diagnosis not present

## 2022-06-10 DIAGNOSIS — S31109A Unspecified open wound of abdominal wall, unspecified quadrant without penetration into peritoneal cavity, initial encounter: Secondary | ICD-10-CM | POA: Diagnosis not present

## 2022-06-10 DIAGNOSIS — K56609 Unspecified intestinal obstruction, unspecified as to partial versus complete obstruction: Secondary | ICD-10-CM | POA: Diagnosis not present

## 2022-06-10 DIAGNOSIS — T8131XD Disruption of external operation (surgical) wound, not elsewhere classified, subsequent encounter: Secondary | ICD-10-CM | POA: Diagnosis not present

## 2022-06-19 ENCOUNTER — Other Ambulatory Visit: Payer: Self-pay | Admitting: Internal Medicine

## 2022-06-22 NOTE — Assessment & Plan Note (Signed)
-  She had low ferritin and low TIBC  -Takes oral iron BID, will continue. Will give IV iron if needed    

## 2022-06-22 NOTE — Assessment & Plan Note (Signed)
pT4aN1aM1a stage IV with liver and nodal metastasis, MSS, KRAS G12S(+)  -Diagnosed in 01/2019 after emergent colectomy and liver biopsy. 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  -Started first line chemo on 03/12/2019, received 5FU/leuc with first 2 cycles for large open abdominal wound after surgery. She started full dose FOLFOX and avastin with cycle 2. Due to neuropathy Oxaliplatin was stopped after 09/24/19.  --For convenience she was switched to oral chemo Xeloda 2 weeks on/1 week off and Bev every 3 weeks in 01/21/20. She is tolerating well -Her restaging CT scan from April 16, 2022 showed stable liver metastasis, no other new lesions. -She is clinically doing well, will continue current therapy. 

## 2022-06-22 NOTE — Assessment & Plan Note (Signed)
-  secondary to chemo -on gabapentin, overall mild and stable  

## 2022-06-22 NOTE — Assessment & Plan Note (Signed)
-  on hydralazine, isosorbide, lisinopril, amlodipine and HCTZ  -she monitors at home where it is normal -Will monitor on bevacizumab.    

## 2022-06-23 ENCOUNTER — Encounter: Payer: Self-pay | Admitting: Hematology

## 2022-06-23 ENCOUNTER — Inpatient Hospital Stay: Payer: Medicare HMO | Attending: Hematology | Admitting: Hematology

## 2022-06-23 ENCOUNTER — Other Ambulatory Visit: Payer: Self-pay

## 2022-06-23 ENCOUNTER — Inpatient Hospital Stay: Payer: Medicare HMO

## 2022-06-23 VITALS — BP 156/64 | HR 66 | Temp 97.5°F | Resp 18 | Ht 67.0 in | Wt 258.1 lb

## 2022-06-23 DIAGNOSIS — Z79899 Other long term (current) drug therapy: Secondary | ICD-10-CM | POA: Diagnosis not present

## 2022-06-23 DIAGNOSIS — Z7189 Other specified counseling: Secondary | ICD-10-CM

## 2022-06-23 DIAGNOSIS — T451X5D Adverse effect of antineoplastic and immunosuppressive drugs, subsequent encounter: Secondary | ICD-10-CM | POA: Diagnosis not present

## 2022-06-23 DIAGNOSIS — I10 Essential (primary) hypertension: Secondary | ICD-10-CM | POA: Diagnosis not present

## 2022-06-23 DIAGNOSIS — C787 Secondary malignant neoplasm of liver and intrahepatic bile duct: Secondary | ICD-10-CM | POA: Diagnosis not present

## 2022-06-23 DIAGNOSIS — D509 Iron deficiency anemia, unspecified: Secondary | ICD-10-CM | POA: Diagnosis not present

## 2022-06-23 DIAGNOSIS — Z5112 Encounter for antineoplastic immunotherapy: Secondary | ICD-10-CM | POA: Diagnosis not present

## 2022-06-23 DIAGNOSIS — G620 Drug-induced polyneuropathy: Secondary | ICD-10-CM | POA: Diagnosis not present

## 2022-06-23 DIAGNOSIS — C189 Malignant neoplasm of colon, unspecified: Secondary | ICD-10-CM

## 2022-06-23 DIAGNOSIS — D500 Iron deficiency anemia secondary to blood loss (chronic): Secondary | ICD-10-CM

## 2022-06-23 DIAGNOSIS — C184 Malignant neoplasm of transverse colon: Secondary | ICD-10-CM | POA: Diagnosis not present

## 2022-06-23 DIAGNOSIS — Z95828 Presence of other vascular implants and grafts: Secondary | ICD-10-CM

## 2022-06-23 LAB — CBC WITH DIFFERENTIAL (CANCER CENTER ONLY)
Abs Immature Granulocytes: 0.03 10*3/uL (ref 0.00–0.07)
Basophils Absolute: 0 10*3/uL (ref 0.0–0.1)
Basophils Relative: 1 %
Eosinophils Absolute: 0.3 10*3/uL (ref 0.0–0.5)
Eosinophils Relative: 3 %
HCT: 35.6 % — ABNORMAL LOW (ref 36.0–46.0)
Hemoglobin: 11.9 g/dL — ABNORMAL LOW (ref 12.0–15.0)
Immature Granulocytes: 0 %
Lymphocytes Relative: 24 %
Lymphs Abs: 1.9 10*3/uL (ref 0.7–4.0)
MCH: 34.9 pg — ABNORMAL HIGH (ref 26.0–34.0)
MCHC: 33.4 g/dL (ref 30.0–36.0)
MCV: 104.4 fL — ABNORMAL HIGH (ref 80.0–100.0)
Monocytes Absolute: 0.7 10*3/uL (ref 0.1–1.0)
Monocytes Relative: 8 %
Neutro Abs: 5 10*3/uL (ref 1.7–7.7)
Neutrophils Relative %: 64 %
Platelet Count: 186 10*3/uL (ref 150–400)
RBC: 3.41 MIL/uL — ABNORMAL LOW (ref 3.87–5.11)
RDW: 16.6 % — ABNORMAL HIGH (ref 11.5–15.5)
WBC Count: 7.9 10*3/uL (ref 4.0–10.5)
nRBC: 0 % (ref 0.0–0.2)

## 2022-06-23 LAB — COMPREHENSIVE METABOLIC PANEL
ALT: 10 U/L (ref 0–44)
AST: 17 U/L (ref 15–41)
Albumin: 4 g/dL (ref 3.5–5.0)
Alkaline Phosphatase: 94 U/L (ref 38–126)
Anion gap: 5 (ref 5–15)
BUN: 27 mg/dL — ABNORMAL HIGH (ref 8–23)
CO2: 21 mmol/L — ABNORMAL LOW (ref 22–32)
Calcium: 9.6 mg/dL (ref 8.9–10.3)
Chloride: 110 mmol/L (ref 98–111)
Creatinine, Ser: 1.21 mg/dL — ABNORMAL HIGH (ref 0.44–1.00)
GFR, Estimated: 51 mL/min — ABNORMAL LOW (ref >60)
Glucose, Bld: 90 mg/dL (ref 70–99)
Potassium: 5.2 mmol/L — ABNORMAL HIGH (ref 3.5–5.1)
Sodium: 136 mmol/L (ref 135–145)
Total Bilirubin: 0.4 mg/dL (ref 0.3–1.2)
Total Protein: 7.9 g/dL (ref 6.5–8.1)

## 2022-06-23 LAB — TOTAL PROTEIN, URINE DIPSTICK: Protein, ur: NEGATIVE mg/dL

## 2022-06-23 MED ORDER — HEPARIN SOD (PORK) LOCK FLUSH 100 UNIT/ML IV SOLN
500.0000 [IU] | Freq: Once | INTRAVENOUS | Status: AC | PRN
  Administered 2022-06-23: 500 [IU]

## 2022-06-23 MED ORDER — SODIUM CHLORIDE 0.9% FLUSH
10.0000 mL | INTRAVENOUS | Status: DC | PRN
  Administered 2022-06-23: 10 mL

## 2022-06-23 MED ORDER — SODIUM CHLORIDE 0.9 % IV SOLN
7.5000 mg/kg | Freq: Once | INTRAVENOUS | Status: AC
  Administered 2022-06-23: 800 mg via INTRAVENOUS
  Filled 2022-06-23: qty 32

## 2022-06-23 MED ORDER — SODIUM CHLORIDE 0.9 % IV SOLN: Freq: Once | INTRAVENOUS | Status: AC

## 2022-06-23 NOTE — Patient Instructions (Signed)
[facility]  Discharge Instructions: Thank you for choosing [facility] to provide your oncology and hematology care.   If you have a lab appointment with the Cancer Center, please go directly to the Cancer Center and check in at the registration area.   Wear comfortable clothing and clothing appropriate for easy access to any Portacath or PICC line.   We strive to give you quality time with your provider. You may need to reschedule your appointment if you arrive late (15 or more minutes).  Arriving late affects you and other patients whose appointments are after yours.  Also, if you miss three or more appointments without notifying the office, you may be dismissed from the clinic at the provider's discretion.      For prescription refill requests, have your pharmacy contact our office and allow 72 hours for refills to be completed.    Today you received the following chemotherapy and/or immunotherapy agents: bevacizumab (Vegzelma)  To help prevent nausea and vomiting after your treatment, we encourage you to take your nausea medication as directed.  BELOW ARE SYMPTOMS THAT SHOULD BE REPORTED IMMEDIATELY: *FEVER GREATER THAN 100.4 F (38 C) OR HIGHER *CHILLS OR SWEATING *NAUSEA AND VOMITING THAT IS NOT CONTROLLED WITH YOUR NAUSEA MEDICATION *UNUSUAL SHORTNESS OF BREATH *UNUSUAL BRUISING OR BLEEDING *URINARY PROBLEMS (pain or burning when urinating, or frequent urination) *BOWEL PROBLEMS (unusual diarrhea, constipation, pain near the anus) TENDERNESS IN MOUTH AND THROAT WITH OR WITHOUT PRESENCE OF ULCERS (sore throat, sores in mouth, or a toothache) UNUSUAL RASH, SWELLING OR PAIN  UNUSUAL VAGINAL DISCHARGE OR ITCHING   Items with * indicate a potential emergency and should be followed up as soon as possible or go to the Emergency Department if any problems should occur.  Please show the CHEMOTHERAPY ALERT CARD or IMMUNOTHERAPY ALERT CARD  at check-in to the Emergency Department and triage nurse.  Should you have questions after your visit or need to cancel or reschedule your appointment, please contact [facility]  Dept: [phone]  and follow the prompts.  Office hours are 8:00 a.m. to 4:30 p.m. Monday - Friday. Please note that voicemails left after 4:00 p.m. may not be returned until the following business day.  We are closed weekends and major holidays. You have access to a nurse at all times for urgent questions. Please call the main number to the clinic Dept: [phone] and follow the prompts.   For any non-urgent questions, you may also contact your provider using MyChart. We now offer e-Visits for anyone 18 and older to request care online for non-urgent symptoms. For details visit [email or web address].   Also download the MyChart app! Go to the app store, search "MyChart", open the app, select [facility], and log in with your MyChart username and password.   

## 2022-06-23 NOTE — Progress Notes (Signed)
[place]   [phone] [phone]   Clinic Follow up Note   Patient Care Team: [name], [name], MD as PCP - General (Internal Medicine) Minus Breeding, MD as PCP - Cardiology (Cardiology) Clovis Riley, MD as Consulting Physician (General Surgery) Truitt Merle, MD as Consulting Physician (Hematology) [name], NP as Nurse Practitioner (Nurse Practitioner)  Date of Service:  06/23/2022  CHIEF COMPLAINT: f/u of metastatic colon cancer   CURRENT THERAPY: Beva and Xeloda, q3weeks, starting 01/21/20             -Xeloda dose: 2000mg  q12h on day 1-14   ASSESSMENT:  Casey Black is a 62 y.o. 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  -Started first line chemo on 03/12/2019, received 5FU/leuc with first 2 cycles for large open abdominal wound after surgery. She started full dose FOLFOX and avastin with cycle 2. Due to neuropathy Oxaliplatin was stopped after 09/24/19.  --For convenience she was switched to oral chemo Xeloda 2 weeks on/1 week off and Bev every 3 weeks in 01/21/20. She is tolerating well -Her restaging CT scan from April 16, 2022 showed stable liver metastasis, no other new lesions. -She is clinically doing well, will continue current therapy.  Hypertension -on hydralazine, isosorbide, lisinopril, amlodipine and HCTZ  -she monitors at home where it is normal -Will monitor on bevacizumab.   IDA (iron deficiency anemia) -She had low ferritin and low TIBC  -Takes oral iron BID, will continue. 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 Bowel-gas pattern consistent with small bowel obstruction. No free air. 2. No acute chest findings.   01/23/2019 Imaging   CT AP IMPRESSION: Obstructing mid transverse colonic mass with mild regional adenopathy and hepatic metastatic disease. The mass likely extends through the serosa; no ascites or peritoneal nodularity.   01/24/2019 Surgery   Surgeon: Clovis Riley MD Assistant: [name] Procedure performed: Transverse colectomy with end colostomy, liver biopsy Procedure classification: URGENT/EMERGENT Preop diagnosis: Obstructing, metastatic transverse colon mass Post-op diagnosis/intraop findings: Same   01/24/2019 Pathology Results   FINAL MICROSCOPIC DIAGNOSIS:   A. COLON, TRANSVERSE, RESECTION:  Colonic adenocarcinoma, 5 cm.  Carcinoma extends into pericolonic connective tissue and focally to  serosal surface.  Margins not involved.  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   B. LIVER NODULE, LEFT, BIOPSY:  Metastatic adenocarcinoma.    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 Hypermetabolic metastatic disease in the liver and mediastinal/hilar/axillary lymph nodes. 2. Focal hypermetabolism in the rectum. Continued attention on follow-up exams is warranted. 3. Focal hypermetabolism medial to the right adrenal gland may be within a metastatic lymph node, better visualized on 01/23/2019. 4. Aortic atherosclerosis (ICD10-170.0). Coronary artery calcification.   03/12/2019 -  Chemotherapy   She started 5FU q2weeks on 03/12/19 for 2 cycles. She started full dose FOLFOX with Avastin on 04/09/19. Oxaliplatin dose reduced repeatedly due to neuropathy C12 and held since C16 on 10/09/19. Now on maintenance Avastin and 5FU q2weeks since 10/09/19       -Maintenance change to maintenance xeloda 2000 mg BID days 1-14 q21 days and q3 weeks Zirabev (15 mg/kg) starting 01/21/20. First cycle was taken 1000mg  BID due to misunderstanding.    05/31/2019 Imaging   Restaging CT CAP IMPRESSION: 1. Similar to mild interval decrease in size of multiple hepatic lesions, partially calcified. 2. 2 mm right upper lobe pulmonary nodule. Recommend attention on follow-up. 3. Emphysema and aortic atherosclerosis.   08/23/2019 Imaging   CT CAP w contrast  IMPRESSION: 1. The dominant peripheral right liver metastasis has mildly increased. Other smaller liver metastases are stable. 2. Otherwise no new or progressive metastatic disease in the chest, abdomen or pelvis. 3. Aortic Atherosclerosis (ICD10-I70.0) and Emphysema (ICD10-J43.9).   11/27/2019 Imaging   CT CAP w contrast  IMPRESSION: Status post transverse colectomy with right mid abdominal colostomy.   Mildly progressive hepatic metastases, as above.   No evidence of metastatic disease in the chest. Small mediastinal lymph nodes are within normal limits.   Additional stable ancillary findings as above.   01/21/2020 -  Chemotherapy   Patient is on  Treatment Plan : COLORECTAL Bevacizumab q21d     02/21/2020 Imaging   IMPRESSION: 1. Stable hepatic metastatic disease. 2. Aortic atherosclerosis (ICD10-I70.0). Coronary artery calcification. 3.  Emphysema (ICD10-J43.9).   05/19/2020 Imaging   CT CAP  IMPRESSION: 1. Multiple partially calcified liver metastases are again noted. With the exception of a small lesion in segment 7/8 lesions are not significantly changed in the interval. No new liver lesions identified. 2. Coronary artery atherosclerotic calcifications. 3. Aortic atherosclerosis. 4. 2 mm right upper lobe lung nodule identified.  Unchanged.   Aortic Atherosclerosis (ICD10-I70.0).   11/17/2020 Imaging   CT CAP  IMPRESSION: 1. Partially calcified lesions throughout the liver are stable accounting for differences in technique, contrasted imaging on today's study is compared to noncontrast imaging on the prior. 2. No new hepatic lesions. 3. Tiny 3 mm pulmonary nodule in the RIGHT upper lobe unchanged since the prior study. Attention on follow-up. 4. Mild fullness of RIGHT paratracheal nodal tissue is minimally increased and borderline enlarged, attention on follow-up. 5. RIGHT lower quadrant colostomy. 6. 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 7. Aortic atherosclerosis.   06/09/2021 Imaging   EXAM: CT CHEST, ABDOMEN, AND PELVIS WITH CONTRAST  IMPRESSION: Stable calcified liver metastases.   Stable mildly enlarged right paratracheal lymph node.   No evidence of new or progressive metastatic disease.   Aortic Atherosclerosis (ICD10-I70.0).   09/14/2021 PET scan  IMPRESSION: Multiple hypermetabolic and partially calcified liver metastases, without significant change compared to most recent CT.   Mild hypermetabolic mediastinal and bilateral hilar lymphadenopathy, also without significant  change.   No evidence of new or progressive metastatic disease.     Electronically Signed   By: Marlaine Hind M.D.   On: 09/15/2021 11:04   04/15/2022 Imaging    IMPRESSION: 1. Several previously noted partially calcified hepatic metastases appear grossly stable compared to the prior examination. No new hepatic lesions or other signs of metastatic disease noted elsewhere in the chest, abdomen or pelvis. 2. Aortic atherosclerosis, in addition to left main and three-vessel coronary artery disease. Please note that although the presence of coronary artery calcium documents the presence of coronary artery disease, the severity of this disease and any potential stenosis cannot be assessed on this non-gated CT examination. Assessment for potential risk factor modification, dietary therapy or pharmacologic therapy may be warranted, if clinically indicated. 3. There are calcifications of the mitral annulus. Echocardiographic correlation for evaluation of potential valvular dysfunction may be warranted if clinically indicated. 4. Additional incidental findings, similar to prior studies, as above.      INTERVAL HISTORY:  Casey Black is here for a follow up of metastatic colon cancer  She was last seen by Terisa Starr on 06/02/2022 She presents to the clinic alone. Pt state she is having some chest discomfort and its intermittent and she has been taking some Tylenol. Pt state she has been doing some lifting. Pt state that it feels like pressure it happens when she is sitting as well.       All other systems were reviewed with the patient and are negative.  MEDICAL HISTORY:  Past Medical History:  Diagnosis Date   Anemia    low iron   Colon cancer ([place]) 01/2019   Hypertension 01/23/2019   Personal history of chemotherapy 01/2019   colon CA   SBO (small bowel obstruction) ([place]) 01/23/2019    SURGICAL HISTORY: Past Surgical History:  Procedure Laterality Date   CESAREAN  SECTION     x2   COLOSTOMY N/A 01/24/2019   Procedure: End Loop Colostomy;  Surgeon: Clovis Riley, MD;  Location: [place] [facility];  Service: General;  Laterality: N/A;   PARTIAL COLECTOMY N/A 01/24/2019   Procedure: PARTIAL COLECTOMY;  Surgeon: Clovis Riley, MD;  Location: [place] [facility];  Service: General;  Laterality: N/A;   PORTACATH PLACEMENT Right 02/28/2019   Procedure: INSERTION PORT-A-CATH WITH ULTRASOUND GUIDANCE;  Surgeon: Clovis Riley, MD;  Location: [place];  Service: General;  Laterality: Right;    I have reviewed the social history and family history with the patient and they are unchanged from previous note.  ALLERGIES:  has No Known Allergies.  MEDICATIONS:  Current Outpatient Medications  Medication Sig Dispense Refill   acetaminophen (TYLENOL) 325 MG tablet Take 2 tablets (650 mg total) by mouth every 6 (six) hours as needed.     amLODipine (NORVASC) 10 MG tablet TAKE 1 TABLET BY MOUTH EVERY DAY 90 tablet 1   benzonatate (TESSALON) 100 MG capsule Take 1 capsule (100 mg total) by mouth every 8 (eight) hours. 21 capsule 0   capecitabine (XELODA) 500 MG tablet Take 4 tablets (2,000 mg total) by mouth 2 (two) times daily after a meal. Take for 14 days, then off for 7 days. 112 tablet 2   ferrous sulfate 325 (65 FE) MG tablet TAKE 1 TABLET BY MOUTH TWICE A DAY WITH FOOD 180  tablet 1   gabapentin (NEURONTIN) 300 MG capsule Take 1 capsule (300 mg total) by mouth 3 (three) times daily. 90 capsule 3   hydrALAZINE (APRESOLINE) 25 MG tablet TAKE 1 TABLET BY MOUTH EVERY 8 HOURS 270 tablet 0   hydrochlorothiazide (HYDRODIURIL) 12.5 MG tablet TAKE 1 TABLET BY MOUTH EVERY DAY 90 tablet 0   hydrocortisone (ANUSOL-HC) 2.5 % rectal cream Place 1 application rectally 2 (two) times daily. 30 g 0   Ibuprofen 200 MG CAPS Take 400 mg by mouth daily as needed (pain).     isosorbide mononitrate (IMDUR) 30 MG 24 hr tablet TAKE 1 TABLET BY MOUTH EVERY DAY 90 tablet 0   lidocaine (LMX) 4 % cream Apply 1  application topically 3 (three) times daily as needed. 30 g 0   lidocaine-prilocaine (EMLA) cream APPLY 1 APPLICATION TOPICALLY AS NEEDED. 30 g 1   lisinopril (ZESTRIL) 40 MG tablet TAKE 1 TABLET (40 MG TOTAL) BY MOUTH DAILY. SCHEDULE APPT FOR FUTURE REFILLS 90 tablet 0   polycarbophil (FIBERCON) 625 MG tablet Take 1 tablet (625 mg total) by mouth daily. 30 tablet 0   No current facility-administered medications for this visit.   Facility-Administered Medications Ordered in Other Visits  Medication Dose Route Frequency Provider Last Rate Last Admin   sodium chloride flush (NS) 0.9 % injection 10 mL  10 mL Intracatheter PRN Truitt Merle, MD   10 mL at 06/06/20 1051   sodium chloride flush (NS) 0.9 % injection 10 mL  10 mL Intracatheter PRN Truitt Merle, MD   10 mL at 06/23/22 1142    PHYSICAL EXAMINATION: ECOG PERFORMANCE STATUS: 1 - Symptomatic but completely ambulatory  Vitals:   06/23/22 1017  BP: (!) 156/64  Pulse: 66  Resp: 18  Temp: (!) 97.5 F (36.4 C)  SpO2: 100%   Wt Readings from Last 3 Encounters:  06/23/22 258 lb 1.6 oz (117.1 kg)  06/02/22 257 lb 12.8 oz (116.9 kg)  05/12/22 259 lb 4.8 oz (117.6 kg)     GENERAL:alert, no distress and comfortable SKIN: skin color normal, no rashes or significant lesions EYES: normal, Conjunctiva are pink and non-injected, sclera clear  NEURO: alert & oriented x 3 with fluent speech   LABORATORY DATA:  I have reviewed the data as listed    Latest Ref Rng & Units 06/23/2022    9:44 AM 06/02/2022   10:38 AM 05/12/2022    8:31 AM  CBC  WBC 4.0 - 10.5 K/uL 7.9  8.6  7.7   Hemoglobin 12.0 - 15.0 g/dL 11.9  11.2  11.3   Hematocrit 36.0 - 46.0 % 35.6  33.6  34.6   Platelets 150 - 400 K/uL 186  194  163         Latest Ref Rng & Units 06/23/2022    9:44 AM 06/02/2022   10:38 AM 05/12/2022    8:31 AM  CMP  Glucose 70 - 99 mg/dL 90  122  78   BUN 8 - 23 mg/dL 27  24  26    Creatinine 0.44 - 1.00 mg/dL 1.21  0.99  0.95   Sodium 135 - 145  mmol/L 136  137  137   Potassium 3.5 - 5.1 mmol/L 5.2  4.5  4.7   Chloride 98 - 111 mmol/L 110  111  111   CO2 22 - 32 mmol/L 21  21  22    Calcium 8.9 - 10.3 mg/dL 9.6  9.1  9.6   Total Protein 6.5 -  8.1 g/dL 7.9  7.0  7.3   Total Bilirubin 0.3 - 1.2 mg/dL 0.4  0.5  0.5   Alkaline Phos 38 - 126 U/L 94  88  93   AST 15 - 41 U/L 17  18  15    ALT 0 - 44 U/L 10  10  9        RADIOGRAPHIC STUDIES: I have personally reviewed the radiological images as listed and agreed with the findings in the report. No results found.    Orders Placed This Encounter  Procedures   CT Abdomen Pelvis W Contrast    Standing Status:   Future    Standing Expiration Date:   06/23/2023    Order Specific Question:   If indicated for the ordered procedure, I authorize the administration of contrast media per Radiology protocol    Answer:   Yes    Order Specific Question:   Does the patient have a contrast media/X-ray dye allergy?    Answer:   Yes    Order Specific Question:   Preferred imaging location?    Answer:   [facility]    Order Specific Question:   Release to patient    Answer:   Immediate [1]    Order Specific Question:   Is Oral Contrast requested for this exam?    Answer:   Yes, Per Radiology protocol   All questions were answered. The patient knows to call the clinic with any problems, questions or concerns. No barriers to learning was detected. The total time spent in the appointment was 30 minutes.     Truitt Merle, MD 06/23/2022   Felicity Coyer, CMA, am acting as scribe for Truitt Merle, MD.   I have reviewed the above documentation for accuracy and completeness, and I agree with the above.

## 2022-06-28 ENCOUNTER — Other Ambulatory Visit: Payer: Self-pay | Admitting: Internal Medicine

## 2022-06-28 ENCOUNTER — Other Ambulatory Visit: Payer: Self-pay | Admitting: Nurse Practitioner

## 2022-06-29 ENCOUNTER — Encounter: Payer: Self-pay | Admitting: Hematology

## 2022-07-06 ENCOUNTER — Other Ambulatory Visit: Payer: Self-pay

## 2022-07-06 ENCOUNTER — Other Ambulatory Visit: Payer: Self-pay | Admitting: Hematology

## 2022-07-06 ENCOUNTER — Other Ambulatory Visit: Payer: Self-pay | Admitting: Internal Medicine

## 2022-07-14 ENCOUNTER — Inpatient Hospital Stay: Payer: Medicare HMO | Attending: Hematology | Admitting: Hematology

## 2022-07-14 ENCOUNTER — Other Ambulatory Visit: Payer: Self-pay

## 2022-07-14 ENCOUNTER — Inpatient Hospital Stay: Payer: Medicare HMO

## 2022-07-14 ENCOUNTER — Encounter: Payer: Self-pay | Admitting: Hematology

## 2022-07-14 VITALS — BP 135/58 | HR 55

## 2022-07-14 VITALS — BP 148/69 | HR 71 | Temp 97.9°F | Resp 18 | Ht 67.0 in | Wt 259.2 lb

## 2022-07-14 DIAGNOSIS — C189 Malignant neoplasm of colon, unspecified: Secondary | ICD-10-CM

## 2022-07-14 DIAGNOSIS — I10 Essential (primary) hypertension: Secondary | ICD-10-CM | POA: Diagnosis not present

## 2022-07-14 DIAGNOSIS — Z5112 Encounter for antineoplastic immunotherapy: Secondary | ICD-10-CM | POA: Diagnosis not present

## 2022-07-14 DIAGNOSIS — Z933 Colostomy status: Secondary | ICD-10-CM | POA: Diagnosis not present

## 2022-07-14 DIAGNOSIS — G620 Drug-induced polyneuropathy: Secondary | ICD-10-CM | POA: Diagnosis not present

## 2022-07-14 DIAGNOSIS — Z7189 Other specified counseling: Secondary | ICD-10-CM

## 2022-07-14 DIAGNOSIS — C184 Malignant neoplasm of transverse colon: Secondary | ICD-10-CM | POA: Insufficient documentation

## 2022-07-14 DIAGNOSIS — T451X5A Adverse effect of antineoplastic and immunosuppressive drugs, initial encounter: Secondary | ICD-10-CM

## 2022-07-14 DIAGNOSIS — D500 Iron deficiency anemia secondary to blood loss (chronic): Secondary | ICD-10-CM | POA: Diagnosis not present

## 2022-07-14 DIAGNOSIS — C787 Secondary malignant neoplasm of liver and intrahepatic bile duct: Secondary | ICD-10-CM | POA: Diagnosis not present

## 2022-07-14 DIAGNOSIS — Z95828 Presence of other vascular implants and grafts: Secondary | ICD-10-CM

## 2022-07-14 DIAGNOSIS — K56609 Unspecified intestinal obstruction, unspecified as to partial versus complete obstruction: Secondary | ICD-10-CM | POA: Diagnosis not present

## 2022-07-14 DIAGNOSIS — D509 Iron deficiency anemia, unspecified: Secondary | ICD-10-CM | POA: Insufficient documentation

## 2022-07-14 DIAGNOSIS — S31109A Unspecified open wound of abdominal wall, unspecified quadrant without penetration into peritoneal cavity, initial encounter: Secondary | ICD-10-CM | POA: Diagnosis not present

## 2022-07-14 DIAGNOSIS — T8131XD Disruption of external operation (surgical) wound, not elsewhere classified, subsequent encounter: Secondary | ICD-10-CM | POA: Diagnosis not present

## 2022-07-14 LAB — CBC WITH DIFFERENTIAL (CANCER CENTER ONLY)
Abs Immature Granulocytes: 0.02 10*3/uL (ref 0.00–0.07)
Basophils Absolute: 0 10*3/uL (ref 0.0–0.1)
Basophils Relative: 0 %
Eosinophils Absolute: 0.2 10*3/uL (ref 0.0–0.5)
Eosinophils Relative: 2 %
HCT: 34.8 % — ABNORMAL LOW (ref 36.0–46.0)
Hemoglobin: 11.5 g/dL — ABNORMAL LOW (ref 12.0–15.0)
Immature Granulocytes: 0 %
Lymphocytes Relative: 25 %
Lymphs Abs: 2.2 10*3/uL (ref 0.7–4.0)
MCH: 34.2 pg — ABNORMAL HIGH (ref 26.0–34.0)
MCHC: 33 g/dL (ref 30.0–36.0)
MCV: 103.6 fL — ABNORMAL HIGH (ref 80.0–100.0)
Monocytes Absolute: 0.6 10*3/uL (ref 0.1–1.0)
Monocytes Relative: 7 %
Neutro Abs: 5.9 10*3/uL (ref 1.7–7.7)
Neutrophils Relative %: 66 %
Platelet Count: 174 10*3/uL (ref 150–400)
RBC: 3.36 MIL/uL — ABNORMAL LOW (ref 3.87–5.11)
RDW: 16.1 % — ABNORMAL HIGH (ref 11.5–15.5)
WBC Count: 8.9 10*3/uL (ref 4.0–10.5)
nRBC: 0 % (ref 0.0–0.2)

## 2022-07-14 LAB — COMPREHENSIVE METABOLIC PANEL
ALT: 11 U/L (ref 0–44)
AST: 18 U/L (ref 15–41)
Albumin: 4 g/dL (ref 3.5–5.0)
Alkaline Phosphatase: 97 U/L (ref 38–126)
Anion gap: 3 — ABNORMAL LOW (ref 5–15)
BUN: 26 mg/dL — ABNORMAL HIGH (ref 8–23)
CO2: 24 mmol/L (ref 22–32)
Calcium: 10.3 mg/dL (ref 8.9–10.3)
Chloride: 109 mmol/L (ref 98–111)
Creatinine, Ser: 1.02 mg/dL — ABNORMAL HIGH (ref 0.44–1.00)
GFR, Estimated: >60 mL/min (ref >60)
Glucose, Bld: 92 mg/dL (ref 70–99)
Potassium: 4.8 mmol/L (ref 3.5–5.1)
Sodium: 136 mmol/L (ref 135–145)
Total Bilirubin: 0.5 mg/dL (ref 0.3–1.2)
Total Protein: 8 g/dL (ref 6.5–8.1)

## 2022-07-14 LAB — CEA (ACCESS): CEA (CHCC): 166.66 ng/mL — ABNORMAL HIGH (ref 0.00–5.00)

## 2022-07-14 LAB — TOTAL PROTEIN, URINE DIPSTICK: Protein, ur: NEGATIVE mg/dL

## 2022-07-14 MED ORDER — SODIUM CHLORIDE 0.9 % IV SOLN
7.5000 mg/kg | Freq: Once | INTRAVENOUS | Status: AC
  Administered 2022-07-14: 800 mg via INTRAVENOUS
  Filled 2022-07-14: qty 32

## 2022-07-14 MED ORDER — SODIUM CHLORIDE 0.9% FLUSH
10.0000 mL | INTRAVENOUS | Status: DC | PRN
  Administered 2022-07-14: 10 mL

## 2022-07-14 MED ORDER — SODIUM CHLORIDE 0.9 % IV SOLN: Freq: Once | INTRAVENOUS | Status: AC

## 2022-07-14 MED ORDER — HEPARIN SOD (PORK) LOCK FLUSH 100 UNIT/ML IV SOLN
500.0000 [IU] | Freq: Once | INTRAVENOUS | Status: AC | PRN
  Administered 2022-07-14: 500 [IU]

## 2022-07-14 NOTE — Assessment & Plan Note (Signed)
-  She had low ferritin and low TIBC  -Takes oral iron BID, will continue. Will give IV iron if needed    

## 2022-07-14 NOTE — Assessment & Plan Note (Signed)
-  on hydralazine, isosorbide, lisinopril, amlodipine and HCTZ  -she monitors at home where it is normal -Will monitor on bevacizumab.    

## 2022-07-14 NOTE — Assessment & Plan Note (Signed)
-  secondary to chemo -on gabapentin, overall mild and stable  

## 2022-07-14 NOTE — Progress Notes (Signed)
[facility]   Telephone:(336) [phone] Fax:(336) [phone]   Clinic Follow up Note   Patient Care Team: Philip AspenHernandez Acosta, Limmie PatriciaEstela Y, MD as PCP - General (Internal Medicine) Rollene RotundaHochrein, James, MD as PCP - Cardiology (Cardiology) Berna Bueonnor, Chelsea A, MD as Consulting Physician (General Surgery) Malachy MoodFeng, Anjel Pardo, MD as Consulting Physician (Hematology) Pollyann SamplesBurton, [name], NP as Nurse Practitioner (Nurse Practitioner)  Date of Service:  07/14/2022  CHIEF COMPLAINT: f/u of metastatic colon cancer     CURRENT THERAPY: Beva and Xeloda, q3weeks, starting 01/21/20             -Xeloda dose: 2000mg  q12h on day 1-14     ASSESSMENT:  Casey EvesMelinda Kay Black is a 62 y.o. 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  -Started first line chemo on 03/12/2019, received 5FU/leuc with first 2 cycles for large open abdominal wound after surgery. She started full dose FOLFOX and avastin with cycle 2. Due to neuropathy Oxaliplatin was stopped after 09/24/19.  --For convenience she was switched to oral chemo Xeloda 2 weeks on/1 week off and Bev every 3 weeks in 01/21/20. She is tolerating well -Her restaging CT scan from April 16, 2022 showed stable liver metastasis, no other new lesions. -She is clinically doing well, will continue current therapy.  Hypertension -on hydralazine, isosorbide, lisinopril, amlodipine and HCTZ  -she monitors at home where it is normal -Will monitor on bevacizumab.     IDA (iron deficiency anemia) -She had low ferritin and low TIBC  -Takes oral iron BID, will continue. 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 Bowel-gas pattern consistent with small bowel obstruction. No free air. 2. No acute chest findings.   01/23/2019 Imaging   CT AP IMPRESSION: Obstructing mid transverse colonic mass with mild regional adenopathy and hepatic metastatic disease. The mass likely extends through the serosa; no ascites or peritoneal nodularity.   01/24/2019 Surgery   Surgeon: Berna Buehelsea A Connor MD Assistant: Mattie MarlinJessica Focht PA-C Procedure performed: Transverse colectomy with end colostomy, liver biopsy Procedure classification: URGENT/EMERGENT Preop diagnosis: Obstructing, metastatic transverse colon mass Post-op diagnosis/intraop findings: Same   01/24/2019 Pathology Results   FINAL MICROSCOPIC DIAGNOSIS:   A. COLON, TRANSVERSE, RESECTION:  Colonic adenocarcinoma, 5 cm.  Carcinoma extends into pericolonic connective tissue and focally to  serosal surface.  Margins not involved.  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   B. LIVER NODULE, LEFT, BIOPSY:  Metastatic adenocarcinoma.    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 Hypermetabolic metastatic disease in the liver and mediastinal/hilar/axillary lymph nodes. 2. Focal  hypermetabolism in the rectum. Continued attention on follow-up exams is warranted. 3. Focal hypermetabolism medial to the right adrenal gland may be within a metastatic lymph node, better visualized on 01/23/2019. 4. Aortic atherosclerosis (ICD10-170.0). Coronary artery calcification.   03/12/2019 -  Chemotherapy   She started 5FU q2weeks on 03/12/19 for 2 cycles. She started full dose FOLFOX with Avastin on 04/09/19. Oxaliplatin dose reduced repeatedly due to neuropathy C12 and held since C16 on 10/09/19. Now on maintenance Avastin and 5FU q2weeks since 10/09/19       -Maintenance change to maintenance xeloda 2000 mg BID days 1-14 q21 days and q3 weeks Zirabev (15 mg/kg) starting 01/21/20. First cycle was taken  BID due to misunderstanding.    05/31/2019 Imaging   Restaging CT CAP IMPRESSION: 1. Similar to mild interval decrease in size of multiple hepatic lesions, partially calcified. 2. 2 mm right upper lobe pulmonary nodule. Recommend attention on follow-up. 3. Emphysema and aortic atherosclerosis.   08/23/2019 Imaging   CT CAP w contrast  IMPRESSION: 1. The dominant peripheral right liver metastasis has mildly increased. Other smaller liver metastases are stable. 2. Otherwise no new or progressive metastatic disease in the chest, abdomen or pelvis. 3. Aortic Atherosclerosis (ICD10-I70.0) and Emphysema (ICD10-J43.9).   11/27/2019 Imaging   CT CAP w contrast  IMPRESSION: Status post transverse colectomy with right mid abdominal colostomy.   Mildly progressive hepatic metastases, as above.   No evidence of metastatic disease in the chest. Small mediastinal lymph nodes are within normal limits.   Additional stable ancillary findings as above.   01/21/2020 -  Chemotherapy   Patient is on Treatment Plan : COLORECTAL Bevacizumab q21d     02/21/2020 Imaging   IMPRESSION: 1. Stable hepatic metastatic disease. 2. Aortic atherosclerosis (ICD10-I70.0). Coronary  artery calcification. 3.  Emphysema (ICD10-J43.9).   05/19/2020 Imaging   CT CAP  IMPRESSION: 1. Multiple partially calcified liver metastases are again noted. With the exception of a small lesion in segment 7/8 lesions are not significantly changed in the interval. No new liver lesions identified. 2. Coronary artery atherosclerotic calcifications. 3. Aortic atherosclerosis. 4. 2 mm right upper lobe lung nodule identified.  Unchanged.   Aortic Atherosclerosis (ICD10-I70.0).   11/17/2020 Imaging   CT CAP  IMPRESSION: 1. Partially calcified lesions throughout the liver are stable accounting for differences in technique, contrasted imaging on today's study is compared to noncontrast imaging on the prior. 2. No new hepatic lesions. 3. Tiny 3 mm pulmonary nodule in the RIGHT upper lobe unchanged since the prior study. Attention on follow-up. 4. Mild fullness of RIGHT paratracheal nodal tissue is minimally increased and borderline enlarged, attention on follow-up. 5. RIGHT lower quadrant colostomy. 6. 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 7. Aortic atherosclerosis.   06/09/2021 Imaging   EXAM: CT CHEST, ABDOMEN, AND PELVIS WITH CONTRAST  IMPRESSION: Stable calcified liver metastases.   Stable mildly enlarged right paratracheal lymph node.   No evidence of new or progressive metastatic disease.   Aortic Atherosclerosis (ICD10-I70.0).   09/14/2021 PET scan   IMPRESSION: Multiple hypermetabolic and partially calcified liver metastases, without significant change compared to most recent CT.   Mild hypermetabolic mediastinal and bilateral hilar lymphadenopathy, also without significant  change.   No evidence of new or progressive metastatic disease.     Electronically Signed   By: Danae Orleans M.D.   On: 09/15/2021 11:04   04/15/2022 Imaging     IMPRESSION: 1. Several previously noted partially calcified hepatic metastases appear grossly stable compared to the prior examination. No new hepatic lesions or other signs of metastatic disease noted elsewhere in the chest, abdomen or pelvis. 2. Aortic atherosclerosis, in addition to left main and three-vessel coronary artery disease. Please note that although the presence of coronary artery calcium documents the presence of coronary artery disease, the severity of this disease and any potential stenosis cannot be assessed on this non-gated CT examination. Assessment for potential risk factor modification, dietary therapy or pharmacologic therapy may be warranted, if clinically indicated. 3. There are calcifications of the mitral annulus. Echocardiographic correlation for evaluation of potential valvular dysfunction may be warranted if clinically indicated. 4. Additional incidental findings, similar to prior studies, as above.      INTERVAL HISTORY:  Casey Black is here for a follow up of metastatic colon cancer. She was last seen by me on 3/202024. She presents to the clinic alone.Pt state that she is doing good. Pt denies having any issues with her hands and feet. Pt report of having some back pain. Pt chest pain has resolved.   All other systems were reviewed with the patient and are negative.  MEDICAL HISTORY:  Past Medical History:  Diagnosis Date   Anemia    low iron   Colon cancer 01/2019   Hypertension 01/23/2019   Personal history of chemotherapy 01/2019   colon CA   SBO (small bowel obstruction) 01/23/2019    SURGICAL HISTORY: Past Surgical History:  Procedure Laterality Date   CESAREAN SECTION     x2   COLOSTOMY N/A 01/24/2019   Procedure: End Loop Colostomy;  Surgeon: Berna Bue, MD;  Location: [facility] OR;  Service: General;  Laterality: N/A;   PARTIAL COLECTOMY N/A 01/24/2019   Procedure: PARTIAL COLECTOMY;  Surgeon: Berna Bue, MD;   Location: [facility] OR;  Service: General;  Laterality: N/A;   PORTACATH PLACEMENT Right 02/28/2019   Procedure: INSERTION PORT-A-CATH WITH ULTRASOUND GUIDANCE;  Surgeon: Berna Bue, MD;  Location: [facility] OR;  Service: General;  Laterality: Right;    I have reviewed the social history and family history with the patient and they are unchanged from previous note.  ALLERGIES:  has No Known Allergies.  MEDICATIONS:  Current Outpatient Medications  Medication Sig Dispense Refill   acetaminophen (TYLENOL) 325 MG tablet Take 2 tablets (650 mg total) by mouth every 6 (six) hours as needed.     amLODipine (NORVASC) 10 MG tablet TAKE 1 TABLET BY MOUTH EVERY DAY 90 tablet 1   benzonatate (TESSALON) 100 MG capsule Take 1 capsule (100 mg total) by mouth every 8 (eight) hours. 21 capsule 0   capecitabine (XELODA) 500 MG tablet Take 4 tablets (2,000 mg total) by mouth 2 (two) times daily after a meal. Take for 14 days, then off for 7 days. 112 tablet 2   ferrous sulfate 325 (65 FE) MG tablet TAKE 1 TABLET BY MOUTH TWICE A DAY WITH FOOD 180 tablet 1   gabapentin (NEURONTIN) 300 MG capsule TAKE 1 CAPSULE BY MOUTH TWICE A DAY 60 capsule 3   hydrALAZINE (APRESOLINE) 25 MG tablet TAKE 1 TABLET BY MOUTH EVERY 8 HOURS 270 tablet 0   hydrochlorothiazide (HYDRODIURIL) 12.5 MG tablet TAKE 1 TABLET BY  MOUTH EVERY DAY 90 tablet 0   hydrocortisone (ANUSOL-HC) 2.5 % rectal cream Place 1 application rectally 2 (two) times daily. 30 g 0   Ibuprofen 200 MG CAPS Take 400 mg by mouth daily as needed (pain).     isosorbide mononitrate (IMDUR) 30 MG 24 hr tablet TAKE 1 TABLET BY MOUTH EVERY DAY 90 tablet 0   lidocaine (LMX) 4 % cream Apply 1 application topically 3 (three) times daily as needed. 30 g 0   lidocaine-prilocaine (EMLA) cream APPLY 1 APPLICATION TOPICALLY AS NEEDED. 30 g 1   lisinopril (ZESTRIL) 40 MG tablet TAKE 1 TABLET (40 MG TOTAL) BY MOUTH DAILY. SCHEDULE APPT FOR FUTURE REFILLS 30 tablet 0   polycarbophil  (FIBERCON) 625 MG tablet Take 1 tablet (625 mg total) by mouth daily. 30 tablet 0   No current facility-administered medications for this visit.   Facility-Administered Medications Ordered in Other Visits  Medication Dose Route Frequency Provider Last Rate Last Admin   sodium chloride flush (NS) 0.9 % injection 10 mL  10 mL Intracatheter PRN Malachy Mood, MD   10 mL at 06/06/20 1051   sodium chloride flush (NS) 0.9 % injection 10 mL  10 mL Intracatheter PRN Malachy Mood, MD   10 mL at 07/14/22 1407    PHYSICAL EXAMINATION: ECOG PERFORMANCE STATUS: 0 - Asymptomatic  Vitals:   07/14/22 1149  BP: (!) 148/69  Pulse: 71  Resp: 18  Temp: 97.9 F (36.6 C)  SpO2: 100%   Wt Readings from Last 3 Encounters:  07/14/22 259 lb 3.2 oz (117.6 kg)  06/23/22 258 lb 1.6 oz (117.1 kg)  06/02/22 257 lb 12.8 oz (116.9 kg)     GENERAL:alert, no distress and comfortable SKIN: skin color normal, no rashes or significant lesions EYES: normal, Conjunctiva are pink and non-injected, sclera clear  NEURO: alert & oriented x 3 with fluent speech  LABORATORY DATA:  I have reviewed the data as listed    Latest Ref Rng & Units 07/14/2022   12:30 PM 06/23/2022    9:44 AM 06/02/2022   10:38 AM  CBC  WBC 4.0 - 10.5 K/uL 8.9  7.9  8.6   Hemoglobin 12.0 - 15.0 g/dL 11.5  52.0  80.2   Hematocrit 36.0 - 46.0 % 34.8  35.6  33.6   Platelets 150 - 400 K/uL 174  186  194         Latest Ref Rng & Units 07/14/2022   12:30 PM 06/23/2022    9:44 AM 06/02/2022   10:38 AM  CMP  Glucose 70 - 99 mg/dL 92  90  233   BUN 8 - 23 mg/dL 26  27  24    Creatinine 0.44 - 1.00 mg/dL 6.12  2.44  9.75   Sodium 135 - 145 mmol/L 136  136  137   Potassium 3.5 - 5.1 mmol/L 4.8  5.2  4.5   Chloride 98 - 111 mmol/L 109  110  111   CO2 22 - 32 mmol/L 24  21  21    Calcium 8.9 - 10.3 mg/dL 30.0  9.6  9.1   Total Protein 6.5 - 8.1 g/dL 8.0  7.9  7.0   Total Bilirubin 0.3 - 1.2 mg/dL 0.5  0.4  0.5   Alkaline Phos 38 - 126 U/L 97  94  88    AST 15 - 41 U/L 18  17  18    ALT 0 - 44 U/L 11  10  10  RADIOGRAPHIC STUDIES: I have personally reviewed the radiological images as listed and agreed with the findings in the report. No results found.    No orders of the defined types were placed in this encounter.  All questions were answered. The patient knows to call the clinic with any problems, questions or concerns. No barriers to learning was detected. The total time spent in the appointment was 25 minutes.     Malachy Mood, MD 07/14/2022   Carolin Coy, CMA, am acting as scribe for Malachy Mood, MD.  I have reviewed the above documentation for accuracy and completeness, and I agree with the above.

## 2022-07-14 NOTE — Patient Instructions (Signed)
[facility]  Discharge Instructions: Thank you for choosing [facility] to provide your oncology and hematology care.   If you have a lab appointment with the Cancer Center, please go directly to the Cancer Center and check in at the registration area.   Wear comfortable clothing and clothing appropriate for easy access to any Portacath or PICC line.   We strive to give you quality time with your provider. You may need to reschedule your appointment if you arrive late (15 or more minutes).  Arriving late affects you and other patients whose appointments are after yours.  Also, if you miss three or more appointments without notifying the office, you may be dismissed from the clinic at the provider's discretion.      For prescription refill requests, have your pharmacy contact our office and allow 72 hours for refills to be completed.    Today you received the following chemotherapy and/or immunotherapy agents: Vegzelma      To help prevent nausea and vomiting after your treatment, we encourage you to take your nausea medication as directed.  BELOW ARE SYMPTOMS THAT SHOULD BE REPORTED IMMEDIATELY: *FEVER GREATER THAN 100.4 F (38 C) OR HIGHER *CHILLS OR SWEATING *NAUSEA AND VOMITING THAT IS NOT CONTROLLED WITH YOUR NAUSEA MEDICATION *UNUSUAL SHORTNESS OF BREATH *UNUSUAL BRUISING OR BLEEDING *URINARY PROBLEMS (pain or burning when urinating, or frequent urination) *BOWEL PROBLEMS (unusual diarrhea, constipation, pain near the anus) TENDERNESS IN MOUTH AND THROAT WITH OR WITHOUT PRESENCE OF ULCERS (sore throat, sores in mouth, or a toothache) UNUSUAL RASH, SWELLING OR PAIN  UNUSUAL VAGINAL DISCHARGE OR ITCHING   Items with * indicate a potential emergency and should be followed up as soon as possible or go to the Emergency Department if any problems should occur.  Please show the CHEMOTHERAPY ALERT CARD or IMMUNOTHERAPY ALERT CARD at  check-in to the Emergency Department and triage nurse.  Should you have questions after your visit or need to cancel or reschedule your appointment, please contact [facility]  Dept: [phone]  and follow the prompts.  Office hours are 8:00 a.m. to 4:30 p.m. Monday - Friday. Please note that voicemails left after 4:00 p.m. may not be returned until the following business day.  We are closed weekends and major holidays. You have access to a nurse at all times for urgent questions. Please call the main number to the clinic Dept: [phone] and follow the prompts.   For any non-urgent questions, you may also contact your provider using MyChart. We now offer e-Visits for anyone 18 and older to request care online for non-urgent symptoms. For details visit [email or web address].   Also download the MyChart app! Go to the app store, search "MyChart", open the app, select [facility], and log in with your MyChart username and password.   

## 2022-07-14 NOTE — Assessment & Plan Note (Signed)
pT4aN1aM1a stage IV with liver and nodal metastasis, MSS, KRAS G12S(+)  -Diagnosed in 01/2019 after emergent colectomy and liver biopsy. 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  -Started first line chemo on 03/12/2019, received 5FU/leuc with first 2 cycles for large open abdominal wound after surgery. She started full dose FOLFOX and avastin with cycle 2. Due to neuropathy Oxaliplatin was stopped after 09/24/19.  --For convenience she was switched to oral chemo Xeloda 2 weeks on/1 week off and Bev every 3 weeks in 01/21/20. She is tolerating well -Her restaging CT scan from April 16, 2022 showed stable liver metastasis, no other new lesions. -She is clinically doing well, will continue current therapy. 

## 2022-07-15 ENCOUNTER — Ambulatory Visit (HOSPITAL_COMMUNITY)
Admission: RE | Admit: 2022-07-15 | Discharge: 2022-07-15 | Disposition: A | Discharge status: Home / Self Care | Payer: Medicare HMO | Source: Ambulatory Visit | Attending: Hematology | Admitting: Hematology

## 2022-07-15 DIAGNOSIS — C787 Secondary malignant neoplasm of liver and intrahepatic bile duct: Secondary | ICD-10-CM | POA: Insufficient documentation

## 2022-07-15 DIAGNOSIS — C189 Malignant neoplasm of colon, unspecified: Secondary | ICD-10-CM | POA: Diagnosis not present

## 2022-07-15 MED ORDER — IOHEXOL 9 MG/ML PO SOLN
500.0000 mL | ORAL | Status: AC
  Administered 2022-07-15 (×2): 500 mL via ORAL

## 2022-07-15 MED ORDER — IOHEXOL 300 MG/ML  SOLN
100.0000 mL | Freq: Once | INTRAMUSCULAR | Status: AC | PRN
  Administered 2022-07-15: 100 mL via INTRAVENOUS

## 2022-07-15 MED ORDER — IOHEXOL 9 MG/ML PO SOLN
ORAL | Status: AC
  Filled 2022-07-15: qty 1000

## 2022-07-20 ENCOUNTER — Encounter: Payer: Self-pay | Admitting: Hematology

## 2022-08-03 NOTE — Assessment & Plan Note (Signed)
-  She had low ferritin and low TIBC  -Takes oral iron BID, will continue. Will give IV iron if needed    

## 2022-08-03 NOTE — Assessment & Plan Note (Signed)
-  secondary to chemo -on gabapentin, overall mild and stable  

## 2022-08-03 NOTE — Assessment & Plan Note (Signed)
-  on hydralazine, isosorbide, lisinopril, amlodipine and HCTZ  -she monitors at home where it is normal -Will monitor on bevacizumab.    

## 2022-08-03 NOTE — Assessment & Plan Note (Addendum)
[id number] stage IV with liver and nodal metastasis, MSS, KRAS G12S(+)  -Diagnosed in 01/2019 after emergent colectomy and liver biopsy. 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  -Started first line chemo on 03/12/2019, received 5FU/leuc with first 2 cycles for large open abdominal wound after surgery. She started full dose FOLFOX and avastin with cycle 2. Due to neuropathy Oxaliplatin was stopped after 09/24/19.  --For convenience she was switched to oral chemo Xeloda 2 weeks on/1 week off and Bev every 3 weeks in 01/21/20. She is tolerating well -Her restaging CT scan from July 15, 2022 showed stable liver metastasis, no other new lesions. I personally reviewed with pt  -She is clinically doing well, will continue current therapy.

## 2022-08-03 NOTE — Progress Notes (Unsigned)
[facility]   [phone] [phone]   Clinic Follow up Note   Patient Care Team: Philip Aspen, Limmie Patricia, MD as PCP - General (Internal Medicine) Rollene Rotunda, MD as PCP - Cardiology (Cardiology) Berna Bue, MD as Consulting Physician (General Surgery) Malachy Mood, MD as Consulting Physician (Hematology) Pollyann Samples, NP as Nurse Practitioner (Nurse Practitioner)  Date of Service:  08/04/2022  CHIEF COMPLAINT: f/u of metastatic colon cancer   CURRENT THERAPY: Beva and Xeloda, q3weeks, starting 01/21/20             -Xeloda dose: 2000mg  q12h on day 1-14    ASSESSMENT:  Casey Black is a 62 y.o. 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  -Started first line chemo on 03/12/2019, received 5FU/leuc with first 2 cycles for large open abdominal wound after surgery. She started full dose FOLFOX and avastin with cycle 2. Due to neuropathy Oxaliplatin was stopped after 09/24/19.  --For convenience she was switched to oral chemo Xeloda 2 weeks on/1 week off and Bev every 3 weeks in 01/21/20. She is tolerating well -Her restaging CT scan from July 15, 2022 showed stable liver metastasis, no other new lesions. I personally reviewed with pt  -She is clinically doing well, will continue current therapy. 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 Will give IV iron if needed     Hypertension -on hydralazine, isosorbide, lisinopril, amlodipine and HCTZ   -she monitors at home where it is normal -Will monitor on bevacizumab.        PLAN: -lab pending -Discuss recent CT scan -stable -proceed with C15 beva today and continue Xeloda at home. 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 Bowel-gas pattern consistent with small bowel obstruction. No free air. 2. No acute chest findings.   01/23/2019 Imaging   CT AP IMPRESSION: Obstructing mid transverse colonic mass with mild regional adenopathy and hepatic metastatic disease. The mass likely extends through the serosa; no ascites or peritoneal nodularity.   01/24/2019 Surgery   Surgeon: Berna Bue MD Assistant: Mattie Marlin PA-C Procedure performed: Transverse colectomy with end colostomy, liver biopsy Procedure classification: URGENT/EMERGENT Preop diagnosis: Obstructing, metastatic transverse colon mass Post-op diagnosis/intraop findings: Same   01/24/2019 Pathology Results   FINAL MICROSCOPIC DIAGNOSIS:   A. COLON, TRANSVERSE, RESECTION:  Colonic adenocarcinoma, 5 cm.  Carcinoma extends into pericolonic connective tissue and focally to  serosal surface.  Margins not involved.  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   B. LIVER NODULE, LEFT, BIOPSY:  Metastatic adenocarcinoma.    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 Hypermetabolic metastatic disease in the liver and mediastinal/hilar/axillary lymph nodes. 2. Focal hypermetabolism in the rectum. Continued attention on follow-up exams is warranted. 3. Focal hypermetabolism medial to the right adrenal gland may be within a metastatic lymph node, better visualized on 01/23/2019. 4. Aortic atherosclerosis (ICD10-170.0). Coronary artery calcification.   03/12/2019 -  Chemotherapy   She started 5FU q2weeks on 03/12/19 for 2 cycles. She started full dose FOLFOX with Avastin on 04/09/19. Oxaliplatin dose reduced repeatedly due to neuropathy C12 and held since C16 on 10/09/19. Now on maintenance Avastin and 5FU q2weeks since 10/09/19       -Maintenance change to maintenance xeloda 2000 mg BID days 1-14 q21 days and q3 weeks Zirabev (15 mg/kg) starting 01/21/20. First cycle was taken 1000mg  BID due to misunderstanding.    05/31/2019 Imaging   Restaging CT CAP IMPRESSION: 1. Similar to mild interval decrease in size of multiple hepatic lesions, partially calcified. 2. 2 mm right upper lobe pulmonary nodule. Recommend attention on follow-up. 3. Emphysema and aortic atherosclerosis.   08/23/2019 Imaging   CT CAP w contrast  IMPRESSION: 1. The dominant peripheral right liver metastasis has mildly increased. Other smaller liver metastases are stable. 2. Otherwise no new or progressive metastatic disease in the chest, abdomen or pelvis. 3. Aortic Atherosclerosis (ICD10-I70.0) and Emphysema (ICD10-J43.9).   11/27/2019 Imaging   CT CAP w contrast  IMPRESSION: Status post transverse colectomy with right mid abdominal colostomy.   Mildly progressive hepatic metastases, as above.   No evidence of metastatic disease in the chest. Small mediastinal lymph nodes are within normal limits.   Additional stable ancillary findings as above.   01/21/2020 -  Chemotherapy   Patient is on Treatment Plan : COLORECTAL Bevacizumab q21d      02/21/2020 Imaging   IMPRESSION: 1. Stable hepatic metastatic disease. 2. Aortic atherosclerosis (ICD10-I70.0). Coronary artery calcification. 3.  Emphysema (ICD10-J43.9).   05/19/2020 Imaging   CT CAP  IMPRESSION: 1. Multiple partially calcified liver metastases are again noted. With the exception of a small lesion in segment 7/8 lesions are not significantly changed in the interval. No new liver lesions identified. 2. Coronary artery atherosclerotic calcifications. 3. Aortic atherosclerosis. 4. 2 mm right upper lobe lung nodule identified.  Unchanged.   Aortic Atherosclerosis (ICD10-I70.0).   11/17/2020 Imaging   CT CAP  IMPRESSION: 1. Partially calcified lesions throughout the liver are stable accounting for differences in technique, contrasted imaging on today's study is compared to noncontrast imaging on the prior. 2. No new hepatic lesions. 3. Tiny 3 mm pulmonary nodule in the RIGHT upper lobe unchanged since the prior study. Attention on follow-up. 4. Mild fullness of RIGHT paratracheal nodal tissue is minimally increased and borderline enlarged, attention on follow-up. 5. RIGHT lower quadrant colostomy. 6. 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 7. Aortic atherosclerosis.   06/09/2021 Imaging   EXAM: CT CHEST, ABDOMEN, AND PELVIS WITH CONTRAST  IMPRESSION: Stable calcified liver metastases.   Stable mildly enlarged right paratracheal lymph node.   No evidence of new or progressive metastatic disease.   Aortic Atherosclerosis (ICD10-I70.0).   09/14/2021 PET scan   IMPRESSION:  Multiple hypermetabolic and partially calcified liver metastases, without significant change compared to most recent CT.   Mild hypermetabolic mediastinal and bilateral hilar lymphadenopathy, also without significant change.   No evidence of new or progressive  metastatic disease.     Electronically Signed   By: Danae Orleans M.D.   On: 09/15/2021 11:04   04/15/2022 Imaging    IMPRESSION: 1. Several previously noted partially calcified hepatic metastases appear grossly stable compared to the prior examination. No new hepatic lesions or other signs of metastatic disease noted elsewhere in the chest, abdomen or pelvis. 2. Aortic atherosclerosis, in addition to left main and three-vessel coronary artery disease. Please note that although the presence of coronary artery calcium documents the presence of coronary artery disease, the severity of this disease and any potential stenosis cannot be assessed on this non-gated CT examination. Assessment for potential risk factor modification, dietary therapy or pharmacologic therapy may be warranted, if clinically indicated. 3. There are calcifications of the mitral annulus. Echocardiographic correlation for evaluation of potential valvular dysfunction may be warranted if clinically indicated. 4. Additional incidental findings, similar to prior studies, as above.   07/15/2022 Imaging    IMPRESSION: 1. Stable exam. No new or progressive findings in the abdomen or pelvis to suggest worsening disease. 2. No substantial change in calcified liver metastases. 3. Right abdominal transverse end colostomy with parastomal herniation of fat and colon proximal to the stoma. 4. Probable left-sided uterine fibroid measuring on the order of 3.1 cm and displacing the endometrial stripe to the right. Pelvic ultrasound could be used to further evaluate as clinically warranted. 5.  Aortic Atherosclerosis (ICD10-I70.0).        INTERVAL HISTORY:  Casey Black is here for a follow up of metastatic colon cancer. She was last seen by me on 07/14/2022. She presents to the clinic alone. Pt state that she is doing very well. Deny having eating issues with diarrhea and appetite.   All other systems were reviewed  with the patient and are negative.  MEDICAL HISTORY:  Past Medical History:  Diagnosis Date   Anemia    low iron   Colon cancer (HCC) 01/2019   Hypertension 01/23/2019   Personal history of chemotherapy 01/2019   colon CA   SBO (small bowel obstruction) (HCC) 01/23/2019    SURGICAL HISTORY: Past Surgical History:  Procedure Laterality Date   CESAREAN SECTION     x2   COLOSTOMY N/A 01/24/2019   Procedure: End Loop Colostomy;  Surgeon: Berna Bue, MD;  Location: [facility] OR;  Service: General;  Laterality: N/A;   PARTIAL COLECTOMY N/A 01/24/2019   Procedure: PARTIAL COLECTOMY;  Surgeon: Berna Bue, MD;  Location: [facility] OR;  Service: General;  Laterality: N/A;   PORTACATH PLACEMENT Right 02/28/2019   Procedure: INSERTION PORT-A-CATH WITH ULTRASOUND GUIDANCE;  Surgeon: Berna Bue, MD;  Location: [facility] OR;  Service: General;  Laterality: Right;    I have reviewed the social history and family history with the patient and they are unchanged from previous note.  ALLERGIES:  has No Known Allergies.  MEDICATIONS:  Current Outpatient Medications  Medication Sig Dispense Refill   acetaminophen (TYLENOL) 325 MG tablet Take 2 tablets (650 mg total) by mouth every 6 (six) hours as needed.     amLODipine (NORVASC) 10 MG tablet TAKE 1 TABLET BY MOUTH EVERY DAY 90 tablet 1   benzonatate (TESSALON) 100 MG capsule Take 1 capsule (100 mg total) by mouth every 8 (eight) hours. 21  capsule 0   capecitabine (XELODA) 500 MG tablet Take 4 tablets (2,000 mg total) by mouth 2 (two) times daily after a meal. Take for 14 days, then off for 7 days. 112 tablet 2   ferrous sulfate 325 (65 FE) MG tablet TAKE 1 TABLET BY MOUTH TWICE A DAY WITH FOOD 180 tablet 1   gabapentin (NEURONTIN) 300 MG capsule TAKE 1 CAPSULE BY MOUTH TWICE A DAY 60 capsule 3   hydrALAZINE (APRESOLINE) 25 MG tablet TAKE 1 TABLET BY MOUTH EVERY 8 HOURS 270 tablet 0   hydrochlorothiazide (HYDRODIURIL) 12.5 MG tablet TAKE 1 TABLET  BY MOUTH EVERY DAY 90 tablet 0   hydrocortisone (ANUSOL-HC) 2.5 % rectal cream Place 1 application rectally 2 (two) times daily. 30 g 0   Ibuprofen 200 MG CAPS Take 400 mg by mouth daily as needed (pain).     isosorbide mononitrate (IMDUR) 30 MG 24 hr tablet TAKE 1 TABLET BY MOUTH EVERY DAY 90 tablet 0   lidocaine (LMX) 4 % cream Apply 1 application topically 3 (three) times daily as needed. 30 g 0   lidocaine-prilocaine (EMLA) cream APPLY 1 APPLICATION TOPICALLY AS NEEDED. 30 g 1   lisinopril (ZESTRIL) 40 MG tablet TAKE 1 TABLET (40 MG TOTAL) BY MOUTH DAILY. SCHEDULE APPT FOR FUTURE REFILLS 30 tablet 0   polycarbophil (FIBERCON) 625 MG tablet Take 1 tablet (625 mg total) by mouth daily. 30 tablet 0   No current facility-administered medications for this visit.   Facility-Administered Medications Ordered in Other Visits  Medication Dose Route Frequency Provider Last Rate Last Admin   sodium chloride flush (NS) 0.9 % injection 10 mL  10 mL Intracatheter PRN Malachy Mood, MD   10 mL at 06/06/20 1051    PHYSICAL EXAMINATION: ECOG PERFORMANCE STATUS: 0 - Asymptomatic  Vitals:   08/04/22 0959  BP: 137/63  Pulse: 68  Resp: 18  Temp: (!) 97.5 F (36.4 C)  SpO2: 100%   Wt Readings from Last 3 Encounters:  08/04/22 254 lb 12.8 oz (115.6 kg)  07/14/22 259 lb 3.2 oz (117.6 kg)  06/23/22 258 lb 1.6 oz (117.1 kg)     GENERAL:alert, no distress and comfortable SKIN: skin color normal, no rashes or significant lesions EYES: normal, Conjunctiva are pink and non-injected, sclera clear  NEURO: alert & oriented x 3 with fluent speech   LABORATORY DATA:  I have reviewed the data as listed    Latest Ref Rng & Units 08/04/2022    9:38 AM 07/14/2022   12:30 PM 06/23/2022    9:44 AM  CBC  WBC 4.0 - 10.5 K/uL 8.5  8.9  7.9   Hemoglobin 12.0 - 15.0 g/dL 81.1  91.4  78.2   Hematocrit 36.0 - 46.0 % 35.1  34.8  35.6   Platelets 150 - 400 K/uL 183  174  186         Latest Ref Rng & Units 07/14/2022    12:30 PM 06/23/2022    9:44 AM 06/02/2022   10:38 AM  CMP  Glucose 70 - 99 mg/dL 92  90  956   BUN 8 - 23 mg/dL 26  27  24    Creatinine 0.44 - 1.00 mg/dL 2.13  0.86  5.78   Sodium 135 - 145 mmol/L 136  136  137   Potassium 3.5 - 5.1 mmol/L 4.8  5.2  4.5   Chloride 98 - 111 mmol/L 109  110  111   CO2 22 - 32 mmol/L 24  21  21   Calcium 8.9 - 10.3 mg/dL 16.1  9.6  9.1   Total Protein 6.5 - 8.1 g/dL 8.0  7.9  7.0   Total Bilirubin 0.3 - 1.2 mg/dL 0.5  0.4  0.5   Alkaline Phos 38 - 126 U/L 97  94  88   AST 15 - 41 U/L 18  17  18    ALT 0 - 44 U/L 11  10  10        RADIOGRAPHIC STUDIES: I have personally reviewed the radiological images as listed and agreed with the findings in the report. No results found.    Orders Placed This Encounter  Procedures   Comprehensive metabolic panel    Standing Status:   Standing    Number of Occurrences:   50    Standing Expiration Date:   08/04/2023   CBC with Differential (Cancer Center Only)    Standing Status:   Future    Standing Expiration Date:   08/25/2023   Total Protein, Urine dipstick    Standing Status:   Future    Standing Expiration Date:   08/25/2023   CBC with Differential (Cancer Center Only)    Standing Status:   Future    Standing Expiration Date:   09/15/2023   Total Protein, Urine dipstick    Standing Status:   Future    Standing Expiration Date:   09/15/2023   CBC with Differential (Cancer Center Only)    Standing Status:   Future    Standing Expiration Date:   10/06/2023   Total Protein, Urine dipstick    Standing Status:   Future    Standing Expiration Date:   10/06/2023   All questions were answered. The patient knows to call the clinic with any problems, questions or concerns. No barriers to learning was detected. The total time spent in the appointment was 25 minutes.     Malachy Mood, MD 08/04/2022   Carolin Coy, CMA, am acting as scribe for Malachy Mood, MD.   I have reviewed the above documentation for accuracy  and completeness, and I agree with the above.

## 2022-08-04 ENCOUNTER — Inpatient Hospital Stay: Payer: Medicare HMO | Attending: Hematology | Admitting: Hematology

## 2022-08-04 ENCOUNTER — Telehealth: Payer: Self-pay | Admitting: Hematology

## 2022-08-04 ENCOUNTER — Inpatient Hospital Stay: Payer: Medicare HMO

## 2022-08-04 ENCOUNTER — Encounter: Payer: Self-pay | Admitting: Hematology

## 2022-08-04 VITALS — BP 137/63 | HR 68 | Temp 97.5°F | Resp 18 | Ht 67.0 in | Wt 254.8 lb

## 2022-08-04 VITALS — BP 140/56 | HR 63 | Resp 16

## 2022-08-04 DIAGNOSIS — C189 Malignant neoplasm of colon, unspecified: Secondary | ICD-10-CM | POA: Diagnosis not present

## 2022-08-04 DIAGNOSIS — Z5112 Encounter for antineoplastic immunotherapy: Secondary | ICD-10-CM | POA: Insufficient documentation

## 2022-08-04 DIAGNOSIS — Z7189 Other specified counseling: Secondary | ICD-10-CM

## 2022-08-04 DIAGNOSIS — C787 Secondary malignant neoplasm of liver and intrahepatic bile duct: Secondary | ICD-10-CM

## 2022-08-04 DIAGNOSIS — T451X5A Adverse effect of antineoplastic and immunosuppressive drugs, initial encounter: Secondary | ICD-10-CM

## 2022-08-04 DIAGNOSIS — I10 Essential (primary) hypertension: Secondary | ICD-10-CM | POA: Diagnosis not present

## 2022-08-04 DIAGNOSIS — D500 Iron deficiency anemia secondary to blood loss (chronic): Secondary | ICD-10-CM | POA: Diagnosis not present

## 2022-08-04 DIAGNOSIS — C184 Malignant neoplasm of transverse colon: Secondary | ICD-10-CM | POA: Diagnosis not present

## 2022-08-04 DIAGNOSIS — Z79899 Other long term (current) drug therapy: Secondary | ICD-10-CM | POA: Insufficient documentation

## 2022-08-04 DIAGNOSIS — Z95828 Presence of other vascular implants and grafts: Secondary | ICD-10-CM

## 2022-08-04 DIAGNOSIS — G620 Drug-induced polyneuropathy: Secondary | ICD-10-CM | POA: Diagnosis not present

## 2022-08-04 LAB — CBC WITH DIFFERENTIAL (CANCER CENTER ONLY)
Abs Immature Granulocytes: 0.05 10*3/uL (ref 0.00–0.07)
Basophils Absolute: 0 10*3/uL (ref 0.0–0.1)
Basophils Relative: 1 %
Eosinophils Absolute: 0.2 10*3/uL (ref 0.0–0.5)
Eosinophils Relative: 3 %
HCT: 35.1 % — ABNORMAL LOW (ref 36.0–46.0)
Hemoglobin: 11.6 g/dL — ABNORMAL LOW (ref 12.0–15.0)
Immature Granulocytes: 1 %
Lymphocytes Relative: 26 %
Lymphs Abs: 2.2 10*3/uL (ref 0.7–4.0)
MCH: 34.5 pg — ABNORMAL HIGH (ref 26.0–34.0)
MCHC: 33 g/dL (ref 30.0–36.0)
MCV: 104.5 fL — ABNORMAL HIGH (ref 80.0–100.0)
Monocytes Absolute: 0.7 10*3/uL (ref 0.1–1.0)
Monocytes Relative: 8 %
Neutro Abs: 5.3 10*3/uL (ref 1.7–7.7)
Neutrophils Relative %: 61 %
Platelet Count: 183 10*3/uL (ref 150–400)
RBC: 3.36 MIL/uL — ABNORMAL LOW (ref 3.87–5.11)
RDW: 16.3 % — ABNORMAL HIGH (ref 11.5–15.5)
WBC Count: 8.5 10*3/uL (ref 4.0–10.5)
nRBC: 0 % (ref 0.0–0.2)

## 2022-08-04 LAB — TOTAL PROTEIN, URINE DIPSTICK: Protein, ur: NEGATIVE mg/dL

## 2022-08-04 MED ORDER — SODIUM CHLORIDE 0.9% FLUSH
10.0000 mL | INTRAVENOUS | Status: DC | PRN
  Administered 2022-08-04: 10 mL

## 2022-08-04 MED ORDER — SODIUM CHLORIDE 0.9 % IV SOLN
7.5000 mg/kg | Freq: Once | INTRAVENOUS | Status: AC
  Administered 2022-08-04: 800 mg via INTRAVENOUS
  Filled 2022-08-04: qty 32

## 2022-08-04 MED ORDER — HEPARIN SOD (PORK) LOCK FLUSH 100 UNIT/ML IV SOLN
500.0000 [IU] | Freq: Once | INTRAVENOUS | Status: AC | PRN
  Administered 2022-08-04: 500 [IU]

## 2022-08-04 MED ORDER — SODIUM CHLORIDE 0.9 % IV SOLN: Freq: Once | INTRAVENOUS | Status: AC

## 2022-08-04 NOTE — Patient Instructions (Signed)
[facility]  Discharge Instructions: Thank you for choosing [facility] to provide your oncology and hematology care.   If you have a lab appointment with the Cancer Center, please go directly to the Cancer Center and check in at the registration area.   Wear comfortable clothing and clothing appropriate for easy access to any Portacath or PICC line.   We strive to give you quality time with your provider. You may need to reschedule your appointment if you arrive late (15 or more minutes).  Arriving late affects you and other patients whose appointments are after yours.  Also, if you miss three or more appointments without notifying the office, you may be dismissed from the clinic at the provider's discretion.      For prescription refill requests, have your pharmacy contact our office and allow 72 hours for refills to be completed.    Today you received the following chemotherapy and/or immunotherapy agents: Vegzelma      To help prevent nausea and vomiting after your treatment, we encourage you to take your nausea medication as directed.  BELOW ARE SYMPTOMS THAT SHOULD BE REPORTED IMMEDIATELY: *FEVER GREATER THAN 100.4 F (38 C) OR HIGHER *CHILLS OR SWEATING *NAUSEA AND VOMITING THAT IS NOT CONTROLLED WITH YOUR NAUSEA MEDICATION *UNUSUAL SHORTNESS OF BREATH *UNUSUAL BRUISING OR BLEEDING *URINARY PROBLEMS (pain or burning when urinating, or frequent urination) *BOWEL PROBLEMS (unusual diarrhea, constipation, pain near the anus) TENDERNESS IN MOUTH AND THROAT WITH OR WITHOUT PRESENCE OF ULCERS (sore throat, sores in mouth, or a toothache) UNUSUAL RASH, SWELLING OR PAIN  UNUSUAL VAGINAL DISCHARGE OR ITCHING   Items with * indicate a potential emergency and should be followed up as soon as possible or go to the Emergency Department if any problems should occur.  Please show the CHEMOTHERAPY ALERT CARD or IMMUNOTHERAPY ALERT CARD at  check-in to the Emergency Department and triage nurse.  Should you have questions after your visit or need to cancel or reschedule your appointment, please contact [facility]  Dept: [phone]  and follow the prompts.  Office hours are 8:00 a.m. to 4:30 p.m. Monday - Friday. Please note that voicemails left after 4:00 p.m. may not be returned until the following business day.  We are closed weekends and major holidays. You have access to a nurse at all times for urgent questions. Please call the main number to the clinic Dept: [phone] and follow the prompts.   For any non-urgent questions, you may also contact your provider using MyChart. We now offer e-Visits for anyone 18 and older to request care online for non-urgent symptoms. For details visit [email or web address].   Also download the MyChart app! Go to the app store, search "MyChart", open the app, select [facility], and log in with your MyChart username and password.   

## 2022-08-04 NOTE — Telephone Encounter (Signed)
Contacted patient to scheduled appointments. Patient is aware of appointments that are scheduled.   

## 2022-08-05 ENCOUNTER — Other Ambulatory Visit: Payer: Self-pay

## 2022-08-11 DIAGNOSIS — K56609 Unspecified intestinal obstruction, unspecified as to partial versus complete obstruction: Secondary | ICD-10-CM | POA: Diagnosis not present

## 2022-08-11 DIAGNOSIS — Z933 Colostomy status: Secondary | ICD-10-CM | POA: Diagnosis not present

## 2022-08-11 DIAGNOSIS — T8131XD Disruption of external operation (surgical) wound, not elsewhere classified, subsequent encounter: Secondary | ICD-10-CM | POA: Diagnosis not present

## 2022-08-11 DIAGNOSIS — S31109A Unspecified open wound of abdominal wall, unspecified quadrant without penetration into peritoneal cavity, initial encounter: Secondary | ICD-10-CM | POA: Diagnosis not present

## 2022-08-12 ENCOUNTER — Other Ambulatory Visit: Payer: Self-pay | Admitting: *Deleted

## 2022-08-12 DIAGNOSIS — C189 Malignant neoplasm of colon, unspecified: Secondary | ICD-10-CM

## 2022-08-12 MED ORDER — CAPECITABINE 500 MG PO TABS
2000.0000 mg | ORAL_TABLET | Freq: Two times a day (BID) | ORAL | 2 refills | Status: DC
Start: 2022-08-12 — End: 2022-10-06

## 2022-08-12 NOTE — Progress Notes (Signed)
Patient called to refill Xeloda. She reports she only has 4 pills for this morning. Per progress note on 08/04/22 patient was to continue Xeloda at home. Refill sent to mail order pharmacy per patient instructions.

## 2022-08-14 ENCOUNTER — Other Ambulatory Visit: Payer: Self-pay | Admitting: Internal Medicine

## 2022-08-25 ENCOUNTER — Inpatient Hospital Stay: Payer: Medicare HMO

## 2022-08-25 VITALS — BP 132/48 | HR 62 | Temp 97.8°F | Resp 18

## 2022-08-25 DIAGNOSIS — C189 Malignant neoplasm of colon, unspecified: Secondary | ICD-10-CM

## 2022-08-25 DIAGNOSIS — C184 Malignant neoplasm of transverse colon: Secondary | ICD-10-CM | POA: Diagnosis not present

## 2022-08-25 DIAGNOSIS — Z7189 Other specified counseling: Secondary | ICD-10-CM

## 2022-08-25 DIAGNOSIS — Z79899 Other long term (current) drug therapy: Secondary | ICD-10-CM | POA: Diagnosis not present

## 2022-08-25 DIAGNOSIS — Z5112 Encounter for antineoplastic immunotherapy: Secondary | ICD-10-CM | POA: Diagnosis not present

## 2022-08-25 DIAGNOSIS — C787 Secondary malignant neoplasm of liver and intrahepatic bile duct: Secondary | ICD-10-CM | POA: Diagnosis not present

## 2022-08-25 DIAGNOSIS — Z95828 Presence of other vascular implants and grafts: Secondary | ICD-10-CM

## 2022-08-25 LAB — CBC WITH DIFFERENTIAL (CANCER CENTER ONLY)
Abs Immature Granulocytes: 0.01 10*3/uL (ref 0.00–0.07)
Basophils Absolute: 0 10*3/uL (ref 0.0–0.1)
Basophils Relative: 1 %
Eosinophils Absolute: 0.2 10*3/uL (ref 0.0–0.5)
Eosinophils Relative: 4 %
HCT: 35.7 % — ABNORMAL LOW (ref 36.0–46.0)
Hemoglobin: 11.6 g/dL — ABNORMAL LOW (ref 12.0–15.0)
Immature Granulocytes: 0 %
Lymphocytes Relative: 26 %
Lymphs Abs: 1.6 10*3/uL (ref 0.7–4.0)
MCH: 33.7 pg (ref 26.0–34.0)
MCHC: 32.5 g/dL (ref 30.0–36.0)
MCV: 103.8 fL — ABNORMAL HIGH (ref 80.0–100.0)
Monocytes Absolute: 0.5 10*3/uL (ref 0.1–1.0)
Monocytes Relative: 8 %
Neutro Abs: 3.8 10*3/uL (ref 1.7–7.7)
Neutrophils Relative %: 61 %
Platelet Count: 147 10*3/uL — ABNORMAL LOW (ref 150–400)
RBC: 3.44 MIL/uL — ABNORMAL LOW (ref 3.87–5.11)
RDW: 15.9 % — ABNORMAL HIGH (ref 11.5–15.5)
WBC Count: 6.2 10*3/uL (ref 4.0–10.5)
nRBC: 0 % (ref 0.0–0.2)

## 2022-08-25 LAB — COMPREHENSIVE METABOLIC PANEL
ALT: 12 U/L (ref 0–44)
AST: 19 U/L (ref 15–41)
Albumin: 3.8 g/dL (ref 3.5–5.0)
Alkaline Phosphatase: 83 U/L (ref 38–126)
Anion gap: 4 — ABNORMAL LOW (ref 5–15)
BUN: 30 mg/dL — ABNORMAL HIGH (ref 8–23)
CO2: 21 mmol/L — ABNORMAL LOW (ref 22–32)
Calcium: 9.4 mg/dL (ref 8.9–10.3)
Chloride: 112 mmol/L — ABNORMAL HIGH (ref 98–111)
Creatinine, Ser: 1.12 mg/dL — ABNORMAL HIGH (ref 0.44–1.00)
GFR, Estimated: 56 mL/min — ABNORMAL LOW (ref >60)
Glucose, Bld: 92 mg/dL (ref 70–99)
Potassium: 4.9 mmol/L (ref 3.5–5.1)
Sodium: 137 mmol/L (ref 135–145)
Total Bilirubin: 0.5 mg/dL (ref 0.3–1.2)
Total Protein: 7.6 g/dL (ref 6.5–8.1)

## 2022-08-25 LAB — TOTAL PROTEIN, URINE DIPSTICK: Protein, ur: NEGATIVE mg/dL

## 2022-08-25 MED ORDER — SODIUM CHLORIDE 0.9% FLUSH
10.0000 mL | INTRAVENOUS | Status: DC | PRN
  Administered 2022-08-25: 10 mL

## 2022-08-25 MED ORDER — HEPARIN SOD (PORK) LOCK FLUSH 100 UNIT/ML IV SOLN
500.0000 [IU] | Freq: Once | INTRAVENOUS | Status: AC | PRN
  Administered 2022-08-25: 500 [IU]

## 2022-08-25 MED ORDER — SODIUM CHLORIDE 0.9 % IV SOLN: Freq: Once | INTRAVENOUS | Status: AC

## 2022-08-25 MED ORDER — SODIUM CHLORIDE 0.9 % IV SOLN
7.5000 mg/kg | Freq: Once | INTRAVENOUS | Status: AC
  Administered 2022-08-25: 800 mg via INTRAVENOUS
  Filled 2022-08-25: qty 32

## 2022-08-25 NOTE — Patient Instructions (Signed)
[facility]  Discharge Instructions: Thank you for choosing [facility] to provide your oncology and hematology care.   If you have a lab appointment with the Cancer Center, please go directly to the Cancer Center and check in at the registration area.   Wear comfortable clothing and clothing appropriate for easy access to any Portacath or PICC line.   We strive to give you quality time with your provider. You may need to reschedule your appointment if you arrive late (15 or more minutes).  Arriving late affects you and other patients whose appointments are after yours.  Also, if you miss three or more appointments without notifying the office, you may be dismissed from the clinic at the provider's discretion.      For prescription refill requests, have your pharmacy contact our office and allow 72 hours for refills to be completed.    Today you received the following chemotherapy and/or immunotherapy agents: Bevacizumab      To help prevent nausea and vomiting after your treatment, we encourage you to take your nausea medication as directed.  BELOW ARE SYMPTOMS THAT SHOULD BE REPORTED IMMEDIATELY: *FEVER GREATER THAN 100.4 F (38 C) OR HIGHER *CHILLS OR SWEATING *NAUSEA AND VOMITING THAT IS NOT CONTROLLED WITH YOUR NAUSEA MEDICATION *UNUSUAL SHORTNESS OF BREATH *UNUSUAL BRUISING OR BLEEDING *URINARY PROBLEMS (pain or burning when urinating, or frequent urination) *BOWEL PROBLEMS (unusual diarrhea, constipation, pain near the anus) TENDERNESS IN MOUTH AND THROAT WITH OR WITHOUT PRESENCE OF ULCERS (sore throat, sores in mouth, or a toothache) UNUSUAL RASH, SWELLING OR PAIN  UNUSUAL VAGINAL DISCHARGE OR ITCHING   Items with * indicate a potential emergency and should be followed up as soon as possible or go to the Emergency Department if any problems should occur.  Please show the CHEMOTHERAPY ALERT CARD or IMMUNOTHERAPY ALERT CARD at  check-in to the Emergency Department and triage nurse.  Should you have questions after your visit or need to cancel or reschedule your appointment, please contact [facility]  Dept: [phone]  and follow the prompts.  Office hours are 8:00 a.m. to 4:30 p.m. Monday - Friday. Please note that voicemails left after 4:00 p.m. may not be returned until the following business day.  We are closed weekends and major holidays. You have access to a nurse at all times for urgent questions. Please call the main number to the clinic Dept: [phone] and follow the prompts.   For any non-urgent questions, you may also contact your provider using MyChart. We now offer e-Visits for anyone 18 and older to request care online for non-urgent symptoms. For details visit [email or web address].   Also download the MyChart app! Go to the app store, search "MyChart", open the app, select [facility], and log in with your MyChart username and password.   

## 2022-08-28 ENCOUNTER — Other Ambulatory Visit: Payer: Self-pay | Admitting: Internal Medicine

## 2022-08-31 ENCOUNTER — Other Ambulatory Visit: Payer: Self-pay | Admitting: Internal Medicine

## 2022-08-31 DIAGNOSIS — I10 Essential (primary) hypertension: Secondary | ICD-10-CM

## 2022-09-13 ENCOUNTER — Telehealth: Payer: Self-pay | Admitting: Hematology

## 2022-09-14 DIAGNOSIS — S31109A Unspecified open wound of abdominal wall, unspecified quadrant without penetration into peritoneal cavity, initial encounter: Secondary | ICD-10-CM | POA: Diagnosis not present

## 2022-09-14 DIAGNOSIS — T8131XD Disruption of external operation (surgical) wound, not elsewhere classified, subsequent encounter: Secondary | ICD-10-CM | POA: Diagnosis not present

## 2022-09-14 DIAGNOSIS — Z933 Colostomy status: Secondary | ICD-10-CM | POA: Diagnosis not present

## 2022-09-14 DIAGNOSIS — K56609 Unspecified intestinal obstruction, unspecified as to partial versus complete obstruction: Secondary | ICD-10-CM | POA: Diagnosis not present

## 2022-09-15 ENCOUNTER — Inpatient Hospital Stay: Payer: Medicare HMO

## 2022-09-16 ENCOUNTER — Other Ambulatory Visit: Payer: Self-pay

## 2022-09-16 ENCOUNTER — Inpatient Hospital Stay: Payer: Medicare HMO | Attending: Hematology

## 2022-09-16 ENCOUNTER — Inpatient Hospital Stay: Payer: Medicare HMO

## 2022-09-16 VITALS — BP 133/59 | HR 57 | Temp 97.7°F | Resp 16 | Wt 256.0 lb

## 2022-09-16 DIAGNOSIS — Z7189 Other specified counseling: Secondary | ICD-10-CM

## 2022-09-16 DIAGNOSIS — Z79899 Other long term (current) drug therapy: Secondary | ICD-10-CM | POA: Diagnosis not present

## 2022-09-16 DIAGNOSIS — C787 Secondary malignant neoplasm of liver and intrahepatic bile duct: Secondary | ICD-10-CM | POA: Diagnosis not present

## 2022-09-16 DIAGNOSIS — C184 Malignant neoplasm of transverse colon: Secondary | ICD-10-CM | POA: Diagnosis not present

## 2022-09-16 DIAGNOSIS — Z95828 Presence of other vascular implants and grafts: Secondary | ICD-10-CM

## 2022-09-16 DIAGNOSIS — C189 Malignant neoplasm of colon, unspecified: Secondary | ICD-10-CM

## 2022-09-16 DIAGNOSIS — Z5112 Encounter for antineoplastic immunotherapy: Secondary | ICD-10-CM | POA: Diagnosis not present

## 2022-09-16 LAB — CBC WITH DIFFERENTIAL (CANCER CENTER ONLY)
Abs Immature Granulocytes: 0.01 10*3/uL (ref 0.00–0.07)
Basophils Absolute: 0 10*3/uL (ref 0.0–0.1)
Basophils Relative: 1 %
Eosinophils Absolute: 0.2 10*3/uL (ref 0.0–0.5)
Eosinophils Relative: 2 %
HCT: 33.8 % — ABNORMAL LOW (ref 36.0–46.0)
Hemoglobin: 11 g/dL — ABNORMAL LOW (ref 12.0–15.0)
Immature Granulocytes: 0 %
Lymphocytes Relative: 26 %
Lymphs Abs: 2 10*3/uL (ref 0.7–4.0)
MCH: 33.2 pg (ref 26.0–34.0)
MCHC: 32.5 g/dL (ref 30.0–36.0)
MCV: 102.1 fL — ABNORMAL HIGH (ref 80.0–100.0)
Monocytes Absolute: 0.7 10*3/uL (ref 0.1–1.0)
Monocytes Relative: 9 %
Neutro Abs: 4.8 10*3/uL (ref 1.7–7.7)
Neutrophils Relative %: 62 %
Platelet Count: 195 10*3/uL (ref 150–400)
RBC: 3.31 MIL/uL — ABNORMAL LOW (ref 3.87–5.11)
RDW: 16.2 % — ABNORMAL HIGH (ref 11.5–15.5)
WBC Count: 7.7 10*3/uL (ref 4.0–10.5)
nRBC: 0 % (ref 0.0–0.2)

## 2022-09-16 LAB — COMPREHENSIVE METABOLIC PANEL
ALT: 12 U/L (ref 0–44)
AST: 19 U/L (ref 15–41)
Albumin: 3.7 g/dL (ref 3.5–5.0)
Alkaline Phosphatase: 82 U/L (ref 38–126)
Anion gap: 4 — ABNORMAL LOW (ref 5–15)
BUN: 21 mg/dL (ref 8–23)
CO2: 24 mmol/L (ref 22–32)
Calcium: 9.9 mg/dL (ref 8.9–10.3)
Chloride: 109 mmol/L (ref 98–111)
Creatinine, Ser: 0.99 mg/dL (ref 0.44–1.00)
GFR, Estimated: >60 mL/min (ref >60)
Glucose, Bld: 95 mg/dL (ref 70–99)
Potassium: 4.3 mmol/L (ref 3.5–5.1)
Sodium: 137 mmol/L (ref 135–145)
Total Bilirubin: 0.5 mg/dL (ref 0.3–1.2)
Total Protein: 7.8 g/dL (ref 6.5–8.1)

## 2022-09-16 LAB — TOTAL PROTEIN, URINE DIPSTICK: Protein, ur: NEGATIVE mg/dL

## 2022-09-16 MED ORDER — SODIUM CHLORIDE 0.9 % IV SOLN
7.5000 mg/kg | Freq: Once | INTRAVENOUS | Status: AC
  Administered 2022-09-16: 800 mg via INTRAVENOUS
  Filled 2022-09-16: qty 32

## 2022-09-16 MED ORDER — SODIUM CHLORIDE 0.9% FLUSH
10.0000 mL | INTRAVENOUS | Status: DC | PRN
  Administered 2022-09-16: 10 mL

## 2022-09-16 MED ORDER — SODIUM CHLORIDE 0.9 % IV SOLN: Freq: Once | INTRAVENOUS | Status: AC

## 2022-09-16 MED ORDER — HEPARIN SOD (PORK) LOCK FLUSH 100 UNIT/ML IV SOLN
500.0000 [IU] | Freq: Once | INTRAVENOUS | Status: AC | PRN
  Administered 2022-09-16: 500 [IU]

## 2022-09-16 NOTE — Patient Instructions (Signed)
[facility]  Discharge Instructions: Thank you for choosing [facility] to provide your oncology and hematology care.   If you have a lab appointment with the Cancer Center, please go directly to the Cancer Center and check in at the registration area.   Wear comfortable clothing and clothing appropriate for easy access to any Portacath or PICC line.   We strive to give you quality time with your provider. You may need to reschedule your appointment if you arrive late (15 or more minutes).  Arriving late affects you and other patients whose appointments are after yours.  Also, if you miss three or more appointments without notifying the office, you may be dismissed from the clinic at the provider's discretion.      For prescription refill requests, have your pharmacy contact our office and allow 72 hours for refills to be completed.    Today you received the following chemotherapy and/or immunotherapy agents: Bevacizumab      To help prevent nausea and vomiting after your treatment, we encourage you to take your nausea medication as directed.  BELOW ARE SYMPTOMS THAT SHOULD BE REPORTED IMMEDIATELY: *FEVER GREATER THAN 100.4 F (38 C) OR HIGHER *CHILLS OR SWEATING *NAUSEA AND VOMITING THAT IS NOT CONTROLLED WITH YOUR NAUSEA MEDICATION *UNUSUAL SHORTNESS OF BREATH *UNUSUAL BRUISING OR BLEEDING *URINARY PROBLEMS (pain or burning when urinating, or frequent urination) *BOWEL PROBLEMS (unusual diarrhea, constipation, pain near the anus) TENDERNESS IN MOUTH AND THROAT WITH OR WITHOUT PRESENCE OF ULCERS (sore throat, sores in mouth, or a toothache) UNUSUAL RASH, SWELLING OR PAIN  UNUSUAL VAGINAL DISCHARGE OR ITCHING   Items with * indicate a potential emergency and should be followed up as soon as possible or go to the Emergency Department if any problems should occur.  Please show the CHEMOTHERAPY ALERT CARD or IMMUNOTHERAPY ALERT CARD at  check-in to the Emergency Department and triage nurse.  Should you have questions after your visit or need to cancel or reschedule your appointment, please contact [facility]  Dept: [phone]  and follow the prompts.  Office hours are 8:00 a.m. to 4:30 p.m. Monday - Friday. Please note that voicemails left after 4:00 p.m. may not be returned until the following business day.  We are closed weekends and major holidays. You have access to a nurse at all times for urgent questions. Please call the main number to the clinic Dept: [phone] and follow the prompts.   For any non-urgent questions, you may also contact your provider using MyChart. We now offer e-Visits for anyone 18 and older to request care online for non-urgent symptoms. For details visit [email or web address].   Also download the MyChart app! Go to the app store, search "MyChart", open the app, select [facility], and log in with your MyChart username and password.   

## 2022-09-20 ENCOUNTER — Encounter: Payer: Self-pay | Admitting: Podiatry

## 2022-09-20 ENCOUNTER — Ambulatory Visit (INDEPENDENT_AMBULATORY_CARE_PROVIDER_SITE_OTHER): Payer: Medicare HMO | Admitting: Podiatry

## 2022-09-20 DIAGNOSIS — M79675 Pain in left toe(s): Secondary | ICD-10-CM | POA: Diagnosis not present

## 2022-09-20 DIAGNOSIS — B351 Tinea unguium: Secondary | ICD-10-CM | POA: Diagnosis not present

## 2022-09-20 DIAGNOSIS — M79674 Pain in right toe(s): Secondary | ICD-10-CM | POA: Diagnosis not present

## 2022-09-20 NOTE — Progress Notes (Signed)
This patient returns to my office for at risk foot care.  This patient requires this care by a professional since this patient will be at risk due to having neuropathy due to cancer treatment.  Patient is presently experiencing peeling in her bottom of both feet.  This patient is unable to cut nails herself since the patient cannot reach her nails.These nails are painful walking and wearing shoes.  This patient presents for at risk foot care today.  General Appearance  Alert, conversant and in no acute stress.  Vascular  Dorsalis pedis and posterior tibial  pulses are weakly  palpable  bilaterally.  Capillary return is within normal limits  bilaterally. Temperature is within normal limits  bilaterally.  Neurologic  Senn-Weinstein monofilament wire test diminished   bilaterally. Muscle power within normal limits bilaterally.  Nails Thick disfigured discolored nails with subungual debris  from hallux to fifth toes bilaterally. No evidence of bacterial infection or drainage bilaterally.  Orthopedic  No limitations of motion  feet .  No crepitus or effusions noted.  No bony pathology or digital deformities noted.  Skin  normotropic skin noted bilaterally.  No signs of infections or ulcers noted.  Asymptomatic porokeratosis sub 4th met left foot.  Desquamation noted plantar aspect feet  B/L.   Onychomycosis  Pain in right toes  Pain in left toes  Consent was obtained for treatment procedures.   Mechanical debridement of nails 1-5  bilaterally performed with a nail nipper.  Filed with dremel without incident.    Return office visit   4 months                   Told patient to return for periodic foot care and evaluation due to potential at risk complications.   Laniyah Rosenwald DPM  

## 2022-09-21 ENCOUNTER — Other Ambulatory Visit: Payer: Self-pay

## 2022-10-05 NOTE — Assessment & Plan Note (Signed)
-  on hydralazine, isosorbide, lisinopril, amlodipine and HCTZ  -she monitors at home where it is normal -Will monitor on bevacizumab.    

## 2022-10-05 NOTE — Assessment & Plan Note (Signed)
-  secondary to chemo -on gabapentin, overall mild and stable  

## 2022-10-05 NOTE — Progress Notes (Unsigned)
[facility]   Telephone:([phone] [phone]   Clinic Follow up Note   Patient Care Team: Philip Aspen, Limmie Patricia, MD as PCP - General (Internal Medicine) Rollene Rotunda, MD as PCP - Cardiology (Cardiology) Berna Bue, MD as Consulting Physician (General Surgery) Malachy Mood, MD as Consulting Physician (Hematology) Pollyann Samples, NP as Nurse Practitioner (Nurse Practitioner)  Date of Service:  10/06/2022  CHIEF COMPLAINT: f/u of metastatic colon cancer     CURRENT THERAPY:  Beva and Xeloda, q3weeks, starting 01/21/20             -Xeloda dose: 2000mg  q12h on day 1-14   ASSESSMENT:  Casey Black is a 62 y.o. 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  -Started first line chemo on 03/12/2019, received 5FU/leuc with first 2 cycles for large open abdominal wound after surgery. She started full dose FOLFOX and avastin with cycle 2. Due to neuropathy Oxaliplatin was stopped after 09/24/19.  --For convenience she was switched to oral chemo Xeloda 2 weeks on/1 week off and Bev every 3 weeks in 01/21/20. She is tolerating well -Her restaging CT scan from July 15, 2022 showed stable liver metastasis, no other new lesions. I personally reviewed with pt  -She is clinically doing well, will continue current therapy. 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     IDA (iron deficiency anemia) -She had low ferritin and  low TIBC  -Takes oral iron BID, will continue. Will give IV iron if needed        PLAN: -lab reviewed -Tumor marker pending -I refill Gabapentin -proceed with treatment -I order CT CAP 11/2022.   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 Bowel-gas pattern consistent with small bowel obstruction. No free air. 2. No acute chest findings.   01/23/2019 Imaging   CT AP IMPRESSION: Obstructing mid transverse colonic mass with mild regional adenopathy and hepatic metastatic disease. The mass likely extends through the serosa; no ascites or peritoneal nodularity.   01/24/2019 Surgery   Surgeon: Berna Bue MD Assistant: Mattie Marlin PA-C Procedure performed: Transverse colectomy with end colostomy, liver biopsy Procedure classification: URGENT/EMERGENT Preop diagnosis: Obstructing, metastatic transverse colon mass Post-op diagnosis/intraop findings: Same   01/24/2019 Pathology Results   FINAL MICROSCOPIC DIAGNOSIS:   A. COLON, TRANSVERSE, RESECTION:  Colonic adenocarcinoma, 5 cm.  Carcinoma extends into pericolonic connective tissue and focally to  serosal surface.  Margins not involved.  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   B. LIVER NODULE, LEFT, BIOPSY:  Metastatic adenocarcinoma.    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  Hypermetabolic metastatic disease in the liver and mediastinal/hilar/axillary lymph nodes. 2. Focal hypermetabolism in the rectum. Continued attention on follow-up exams is warranted. 3. Focal hypermetabolism medial to the right adrenal gland may be within a metastatic lymph node, better visualized on 01/23/2019. 4. Aortic atherosclerosis (ICD10-170.0). Coronary artery calcification.   03/12/2019 -  Chemotherapy   She started 5FU q2weeks on 03/12/19 for 2 cycles. She started full dose FOLFOX with Avastin on 04/09/19. Oxaliplatin dose reduced repeatedly due to neuropathy C12 and held since C16 on 10/09/19. Now on maintenance Avastin and 5FU q2weeks since 10/09/19       -Maintenance change to maintenance xeloda 2000 mg BID days 1-14 q21 days and q3 weeks Zirabev (15 mg/kg) starting 01/21/20. First cycle was taken 1000mg  BID due to misunderstanding.    05/31/2019 Imaging   Restaging CT CAP IMPRESSION: 1. Similar to mild interval decrease in size of multiple hepatic lesions, partially calcified. 2. 2 mm right upper lobe pulmonary nodule. Recommend attention on follow-up. 3. Emphysema and aortic atherosclerosis.   08/23/2019 Imaging   CT CAP w contrast  IMPRESSION: 1. The dominant peripheral right liver metastasis has mildly increased. Other smaller liver metastases are stable. 2. Otherwise no new or progressive metastatic disease in the chest, abdomen or pelvis. 3. Aortic Atherosclerosis (ICD10-I70.0) and Emphysema (ICD10-J43.9).   11/27/2019 Imaging   CT CAP w contrast  IMPRESSION: Status post transverse colectomy with right mid abdominal colostomy.   Mildly progressive hepatic metastases, as above.   No evidence of metastatic disease in the chest. Small mediastinal lymph nodes are within normal limits.   Additional stable ancillary findings as above.   01/21/2020 -  Chemotherapy   Patient is on Treatment Plan : COLORECTAL Bevacizumab q21d     02/21/2020 Imaging   IMPRESSION: 1. Stable  hepatic metastatic disease. 2. Aortic atherosclerosis (ICD10-I70.0). Coronary artery calcification. 3.  Emphysema (ICD10-J43.9).   05/19/2020 Imaging   CT CAP  IMPRESSION: 1. Multiple partially calcified liver metastases are again noted. With the exception of a small lesion in segment 7/8 lesions are not significantly changed in the interval. No new liver lesions identified. 2. Coronary artery atherosclerotic calcifications. 3. Aortic atherosclerosis. 4. 2 mm right upper lobe lung nodule identified.  Unchanged.   Aortic Atherosclerosis (ICD10-I70.0).   11/17/2020 Imaging   CT CAP  IMPRESSION: 1. Partially calcified lesions throughout the liver are stable accounting for differences in technique, contrasted imaging on today's study is compared to noncontrast imaging on the prior. 2. No new hepatic lesions. 3. Tiny 3 mm pulmonary nodule in the RIGHT upper lobe unchanged since the prior study. Attention on follow-up. 4. Mild fullness of RIGHT paratracheal nodal tissue is minimally increased and borderline enlarged, attention on follow-up. 5. RIGHT lower quadrant colostomy. 6. 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 7. Aortic atherosclerosis.   06/09/2021 Imaging   EXAM: CT CHEST, ABDOMEN, AND PELVIS WITH CONTRAST  IMPRESSION: Stable calcified liver metastases.   Stable mildly enlarged right paratracheal lymph node.   No evidence of new or progressive metastatic disease.   Aortic Atherosclerosis (ICD10-I70.0).   09/14/2021 PET scan   IMPRESSION: Multiple hypermetabolic and partially calcified liver metastases, without significant change compared  to most recent CT.   Mild hypermetabolic mediastinal and bilateral hilar lymphadenopathy, also without significant change.   No evidence of new or progressive metastatic disease.     Electronically Signed    By: Danae Orleans M.D.   On: 09/15/2021 11:04   04/15/2022 Imaging    IMPRESSION: 1. Several previously noted partially calcified hepatic metastases appear grossly stable compared to the prior examination. No new hepatic lesions or other signs of metastatic disease noted elsewhere in the chest, abdomen or pelvis. 2. Aortic atherosclerosis, in addition to left main and three-vessel coronary artery disease. Please note that although the presence of coronary artery calcium documents the presence of coronary artery disease, the severity of this disease and any potential stenosis cannot be assessed on this non-gated CT examination. Assessment for potential risk factor modification, dietary therapy or pharmacologic therapy may be warranted, if clinically indicated. 3. There are calcifications of the mitral annulus. Echocardiographic correlation for evaluation of potential valvular dysfunction may be warranted if clinically indicated. 4. Additional incidental findings, similar to prior studies, as above.   07/15/2022 Imaging    IMPRESSION: 1. Stable exam. No new or progressive findings in the abdomen or pelvis to suggest worsening disease. 2. No substantial change in calcified liver metastases. 3. Right abdominal transverse end colostomy with parastomal herniation of fat and colon proximal to the stoma. 4. Probable left-sided uterine fibroid measuring on the order of 3.1 cm and displacing the endometrial stripe to the right. Pelvic ultrasound could be used to further evaluate as clinically warranted. 5.  Aortic Atherosclerosis (ICD10-I70.0).        INTERVAL HISTORY:  Casey Black is here for a follow up of metastatic colon cancer . She was last seen by me on 08/04/2022. She presents to the clinic alone. Pt state that she is clinically doing well. She state that she has some skin discoloration and some neuropathy without change. Pt state that is taking a beach trip  11/21/2022       All other systems were reviewed with the patient and are negative.  MEDICAL HISTORY:  Past Medical History:  Diagnosis Date   Anemia    low iron   Colon cancer (HCC) 01/2019   Hypertension 01/23/2019   Personal history of chemotherapy 01/2019   colon CA   SBO (small bowel obstruction) (HCC) 01/23/2019    SURGICAL HISTORY: Past Surgical History:  Procedure Laterality Date   CESAREAN SECTION     x2   COLOSTOMY N/A 01/24/2019   Procedure: End Loop Colostomy;  Surgeon: Berna Bue, MD;  Location: [facility] OR;  Service: General;  Laterality: N/A;   PARTIAL COLECTOMY N/A 01/24/2019   Procedure: PARTIAL COLECTOMY;  Surgeon: Berna Bue, MD;  Location: [facility] OR;  Service: General;  Laterality: N/A;   PORTACATH PLACEMENT Right 02/28/2019   Procedure: INSERTION PORT-A-CATH WITH ULTRASOUND GUIDANCE;  Surgeon: Berna Bue, MD;  Location: [facility] OR;  Service: General;  Laterality: Right;    I have reviewed the social history and family history with the patient and they are unchanged from previous note.  ALLERGIES:  has No Known Allergies.  MEDICATIONS:  Current Outpatient Medications  Medication Sig Dispense Refill   acetaminophen (TYLENOL) 325 MG tablet Take 2 tablets (650 mg total) by mouth every 6 (six) hours as needed.     amLODipine (NORVASC) 10 MG tablet TAKE 1 TABLET BY MOUTH EVERY DAY 90 tablet 1   benzonatate (TESSALON) 100 MG capsule Take 1 capsule (100 mg  total) by mouth every 8 (eight) hours. 21 capsule 0   capecitabine (XELODA) 500 MG tablet Take 4 tablets (2,000 mg total) by mouth 2 (two) times daily after a meal. Take for 14 days, then off for 7 days. 112 tablet 2   ferrous sulfate 325 (65 FE) MG tablet TAKE 1 TABLET BY MOUTH TWICE A DAY WITH FOOD 180 tablet 1   gabapentin (NEURONTIN) 300 MG capsule TAKE 1 CAPSULE BY MOUTH TWICE A DAY 60 capsule 3   hydrALAZINE (APRESOLINE) 25 MG tablet TAKE 1 TABLET BY MOUTH EVERY 8 HOURS 270 tablet 0    hydrochlorothiazide (HYDRODIURIL) 12.5 MG tablet TAKE 1 TABLET BY MOUTH EVERY DAY 90 tablet 0   hydrocortisone (ANUSOL-HC) 2.5 % rectal cream Place 1 application rectally 2 (two) times daily. 30 g 0   Ibuprofen 200 MG CAPS Take 400 mg by mouth daily as needed (pain).     isosorbide mononitrate (IMDUR) 30 MG 24 hr tablet TAKE 1 TABLET BY MOUTH EVERY DAY 90 tablet 0   lidocaine (LMX) 4 % cream Apply 1 application topically 3 (three) times daily as needed. 30 g 0   lidocaine-prilocaine (EMLA) cream APPLY 1 APPLICATION TOPICALLY AS NEEDED. 30 g 1   lisinopril (ZESTRIL) 40 MG tablet TAKE 1 TABLET (40 MG TOTAL) BY MOUTH DAILY. SCHEDULE APPT FOR FUTURE REFILLS 30 tablet 0   polycarbophil (FIBERCON) 625 MG tablet Take 1 tablet (625 mg total) by mouth daily. 30 tablet 0   No current facility-administered medications for this visit.   Facility-Administered Medications Ordered in Other Visits  Medication Dose Route Frequency Provider Last Rate Last Admin   sodium chloride flush (NS) 0.9 % injection 10 mL  10 mL Intracatheter PRN Malachy Mood, MD   10 mL at 06/06/20 1051    PHYSICAL EXAMINATION: ECOG PERFORMANCE STATUS: {CHL ONC ECOG WU:[id number]}  Vitals:   10/06/22 0825  BP: (!) 140/56  Pulse: 61  Resp: 18  Temp: 97.9 F (36.6 C)  SpO2: 100%   Wt Readings from Last 3 Encounters:  10/06/22 253 lb 3.2 oz (114.9 kg)  09/16/22 256 lb (116.1 kg)  08/04/22 254 lb 12.8 oz (115.6 kg)    {Only keep what was examined. If exam not performed, can use .CEXAM } GENERAL:alert, no distress and comfortable SKIN: skin color, texture, turgor are normal, no rashes or significant lesions EYES: normal, Conjunctiva are pink and non-injected, sclera clear {OROPHARYNX:no exudate, no erythema and lips, buccal mucosa, and tongue normal}  NECK: supple, thyroid normal size, non-tender, without nodularity LYMPH:  no palpable lymphadenopathy in the cervical, axillary {or inguinal} LUNGS: clear to auscultation and  percussion with normal breathing effort HEART: regular rate & rhythm and no murmurs and no lower extremity edema ABDOMEN:abdomen soft, non-tender and normal bowel sounds Musculoskeletal:no cyanosis of digits and no clubbing  NEURO: alert & oriented x 3 with fluent speech, no focal motor/sensory deficits  LABORATORY DATA:  I have reviewed the data as listed    Latest Ref Rng & Units 10/06/2022    8:00 AM 09/16/2022    2:46 PM 08/25/2022    7:56 AM  CBC  WBC 4.0 - 10.5 K/uL 6.4  7.7  6.2   Hemoglobin 12.0 - 15.0 g/dL 95.6  21.3  08.6   Hematocrit 36.0 - 46.0 % 35.5  33.8  35.7   Platelets 150 - 400 K/uL 180  195  147         Latest Ref Rng & Units 09/16/2022  2:46 PM 08/25/2022    7:56 AM 07/14/2022   12:30 PM  CMP  Glucose 70 - 99 mg/dL 95  92  92   BUN 8 - 23 mg/dL 21  30  26    Creatinine 0.44 - 1.00 mg/dL 9.60  4.54  0.98   Sodium 135 - 145 mmol/L 137  137  136   Potassium 3.5 - 5.1 mmol/L 4.3  4.9  4.8   Chloride 98 - 111 mmol/L 109  112  109   CO2 22 - 32 mmol/L 24  21  24    Calcium 8.9 - 10.3 mg/dL 9.9  9.4  11.9   Total Protein 6.5 - 8.1 g/dL 7.8  7.6  8.0   Total Bilirubin 0.3 - 1.2 mg/dL 0.5  0.5  0.5   Alkaline Phos 38 - 126 U/L 82  83  97   AST 15 - 41 U/L 19  19  18    ALT 0 - 44 U/L 12  12  11        RADIOGRAPHIC STUDIES: I have personally reviewed the radiological images as listed and agreed with the findings in the report. No results found.    No orders of the defined types were placed in this encounter.  All questions were answered. The patient knows to call the clinic with any problems, questions or concerns. No barriers to learning was detected. The total time spent in the appointment was {CHL ONC TIME VISIT - JYNWG:[id number]}.     Salome Holmes, CMA 10/06/2022   I, Monica Martinez, CMA, am acting as scribe for Malachy Mood, MD.   {Add scribe attestation statement}

## 2022-10-05 NOTE — Assessment & Plan Note (Signed)
[id number] stage IV with liver and nodal metastasis, MSS, KRAS G12S(+)  -Diagnosed in 01/2019 after emergent colectomy and liver biopsy. 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  -Started first line chemo on 03/12/2019, received 5FU/leuc with first 2 cycles for large open abdominal wound after surgery. She started full dose FOLFOX and avastin with cycle 2. Due to neuropathy Oxaliplatin was stopped after 09/24/19.  --For convenience she was switched to oral chemo Xeloda 2 weeks on/1 week off and Bev every 3 weeks in 01/21/20. She is tolerating well -Her restaging CT scan from July 15, 2022 showed stable liver metastasis, no other new lesions. I personally reviewed with pt  -She is clinically doing well, will continue current therapy. -Since she is tolerating treatment very well, I will see her every other treatment (6 week)

## 2022-10-05 NOTE — Assessment & Plan Note (Signed)
-  She had low ferritin and low TIBC  -Takes oral iron BID, will continue. Will give IV iron if needed    

## 2022-10-06 ENCOUNTER — Inpatient Hospital Stay: Payer: Medicare HMO | Attending: Hematology

## 2022-10-06 ENCOUNTER — Other Ambulatory Visit: Payer: Self-pay

## 2022-10-06 ENCOUNTER — Inpatient Hospital Stay: Payer: Medicare HMO

## 2022-10-06 ENCOUNTER — Inpatient Hospital Stay (HOSPITAL_BASED_OUTPATIENT_CLINIC_OR_DEPARTMENT_OTHER): Payer: Medicare HMO | Admitting: Hematology

## 2022-10-06 VITALS — BP 131/56 | HR 58 | Temp 97.7°F | Resp 16

## 2022-10-06 VITALS — BP 140/56 | HR 61 | Temp 97.9°F | Resp 18 | Ht 67.0 in | Wt 253.2 lb

## 2022-10-06 DIAGNOSIS — C184 Malignant neoplasm of transverse colon: Secondary | ICD-10-CM | POA: Insufficient documentation

## 2022-10-06 DIAGNOSIS — Z7189 Other specified counseling: Secondary | ICD-10-CM

## 2022-10-06 DIAGNOSIS — C787 Secondary malignant neoplasm of liver and intrahepatic bile duct: Secondary | ICD-10-CM

## 2022-10-06 DIAGNOSIS — D500 Iron deficiency anemia secondary to blood loss (chronic): Secondary | ICD-10-CM | POA: Diagnosis not present

## 2022-10-06 DIAGNOSIS — I10 Essential (primary) hypertension: Secondary | ICD-10-CM

## 2022-10-06 DIAGNOSIS — Z79899 Other long term (current) drug therapy: Secondary | ICD-10-CM | POA: Diagnosis not present

## 2022-10-06 DIAGNOSIS — T451X5A Adverse effect of antineoplastic and immunosuppressive drugs, initial encounter: Secondary | ICD-10-CM

## 2022-10-06 DIAGNOSIS — C189 Malignant neoplasm of colon, unspecified: Secondary | ICD-10-CM

## 2022-10-06 DIAGNOSIS — Z5112 Encounter for antineoplastic immunotherapy: Secondary | ICD-10-CM | POA: Insufficient documentation

## 2022-10-06 DIAGNOSIS — G620 Drug-induced polyneuropathy: Secondary | ICD-10-CM | POA: Diagnosis not present

## 2022-10-06 DIAGNOSIS — Z95828 Presence of other vascular implants and grafts: Secondary | ICD-10-CM

## 2022-10-06 LAB — COMPREHENSIVE METABOLIC PANEL
ALT: 10 U/L (ref 0–44)
AST: 17 U/L (ref 15–41)
Albumin: 3.7 g/dL (ref 3.5–5.0)
Alkaline Phosphatase: 87 U/L (ref 38–126)
Anion gap: 5 (ref 5–15)
BUN: 32 mg/dL — ABNORMAL HIGH (ref 8–23)
CO2: 20 mmol/L — ABNORMAL LOW (ref 22–32)
Calcium: 9.6 mg/dL (ref 8.9–10.3)
Chloride: 113 mmol/L — ABNORMAL HIGH (ref 98–111)
Creatinine, Ser: 1.15 mg/dL — ABNORMAL HIGH (ref 0.44–1.00)
GFR, Estimated: 54 mL/min — ABNORMAL LOW (ref >60)
Glucose, Bld: 71 mg/dL (ref 70–99)
Potassium: 4.6 mmol/L (ref 3.5–5.1)
Sodium: 138 mmol/L (ref 135–145)
Total Bilirubin: 0.4 mg/dL (ref 0.3–1.2)
Total Protein: 7.3 g/dL (ref 6.5–8.1)

## 2022-10-06 LAB — CEA (ACCESS): CEA (CHCC): 157.94 ng/mL — ABNORMAL HIGH (ref 0.00–5.00)

## 2022-10-06 LAB — CBC WITH DIFFERENTIAL (CANCER CENTER ONLY)
Abs Immature Granulocytes: 0.02 10*3/uL (ref 0.00–0.07)
Basophils Absolute: 0 10*3/uL (ref 0.0–0.1)
Basophils Relative: 1 %
Eosinophils Absolute: 0.2 10*3/uL (ref 0.0–0.5)
Eosinophils Relative: 3 %
HCT: 35.5 % — ABNORMAL LOW (ref 36.0–46.0)
Hemoglobin: 11.4 g/dL — ABNORMAL LOW (ref 12.0–15.0)
Immature Granulocytes: 0 %
Lymphocytes Relative: 32 %
Lymphs Abs: 2 10*3/uL (ref 0.7–4.0)
MCH: 33.1 pg (ref 26.0–34.0)
MCHC: 32.1 g/dL (ref 30.0–36.0)
MCV: 103.2 fL — ABNORMAL HIGH (ref 80.0–100.0)
Monocytes Absolute: 0.6 10*3/uL (ref 0.1–1.0)
Monocytes Relative: 10 %
Neutro Abs: 3.5 10*3/uL (ref 1.7–7.7)
Neutrophils Relative %: 54 %
Platelet Count: 180 10*3/uL (ref 150–400)
RBC: 3.44 MIL/uL — ABNORMAL LOW (ref 3.87–5.11)
RDW: 15.9 % — ABNORMAL HIGH (ref 11.5–15.5)
WBC Count: 6.4 10*3/uL (ref 4.0–10.5)
nRBC: 0 % (ref 0.0–0.2)

## 2022-10-06 LAB — TOTAL PROTEIN, URINE DIPSTICK: Protein, ur: NEGATIVE mg/dL

## 2022-10-06 MED ORDER — SODIUM CHLORIDE 0.9% FLUSH
10.0000 mL | INTRAVENOUS | Status: DC | PRN
  Administered 2022-10-06: 10 mL

## 2022-10-06 MED ORDER — CAPECITABINE 500 MG PO TABS
2000.0000 mg | ORAL_TABLET | Freq: Two times a day (BID) | ORAL | 2 refills | Status: DC
Start: 2022-10-06 — End: 2022-12-29

## 2022-10-06 MED ORDER — SODIUM CHLORIDE 0.9 % IV SOLN: Freq: Once | INTRAVENOUS | Status: AC

## 2022-10-06 MED ORDER — HEPARIN SOD (PORK) LOCK FLUSH 100 UNIT/ML IV SOLN
500.0000 [IU] | Freq: Once | INTRAVENOUS | Status: AC | PRN
  Administered 2022-10-06: 500 [IU]

## 2022-10-06 MED ORDER — GABAPENTIN 300 MG PO CAPS: 300.0000 mg | ORAL_CAPSULE | Freq: Two times a day (BID) | ORAL | 3 refills | Status: DC

## 2022-10-06 MED ORDER — SODIUM CHLORIDE 0.9 % IV SOLN
7.5000 mg/kg | Freq: Once | INTRAVENOUS | Status: AC
  Administered 2022-10-06: 800 mg via INTRAVENOUS
  Filled 2022-10-06: qty 32

## 2022-10-06 NOTE — Patient Instructions (Signed)
[facility]  Discharge Instructions: Thank you for choosing [facility] to provide your oncology and hematology care.   If you have a lab appointment with the Cancer Center, please go directly to the Cancer Center and check in at the registration area.   Wear comfortable clothing and clothing appropriate for easy access to any Portacath or PICC line.   We strive to give you quality time with your provider. You may need to reschedule your appointment if you arrive late (15 or more minutes).  Arriving late affects you and other patients whose appointments are after yours.  Also, if you miss three or more appointments without notifying the office, you may be dismissed from the clinic at the provider's discretion.      For prescription refill requests, have your pharmacy contact our office and allow 72 hours for refills to be completed.    Today you received the following chemotherapy and/or immunotherapy agents: Bevacizumab      To help prevent nausea and vomiting after your treatment, we encourage you to take your nausea medication as directed.  BELOW ARE SYMPTOMS THAT SHOULD BE REPORTED IMMEDIATELY: *FEVER GREATER THAN 100.4 F (38 C) OR HIGHER *CHILLS OR SWEATING *NAUSEA AND VOMITING THAT IS NOT CONTROLLED WITH YOUR NAUSEA MEDICATION *UNUSUAL SHORTNESS OF BREATH *UNUSUAL BRUISING OR BLEEDING *URINARY PROBLEMS (pain or burning when urinating, or frequent urination) *BOWEL PROBLEMS (unusual diarrhea, constipation, pain near the anus) TENDERNESS IN MOUTH AND THROAT WITH OR WITHOUT PRESENCE OF ULCERS (sore throat, sores in mouth, or a toothache) UNUSUAL RASH, SWELLING OR PAIN  UNUSUAL VAGINAL DISCHARGE OR ITCHING   Items with * indicate a potential emergency and should be followed up as soon as possible or go to the Emergency Department if any problems should occur.  Please show the CHEMOTHERAPY ALERT CARD or IMMUNOTHERAPY ALERT CARD at  check-in to the Emergency Department and triage nurse.  Should you have questions after your visit or need to cancel or reschedule your appointment, please contact [facility]  Dept: [phone]  and follow the prompts.  Office hours are 8:00 a.m. to 4:30 p.m. Monday - Friday. Please note that voicemails left after 4:00 p.m. may not be returned until the following business day.  We are closed weekends and major holidays. You have access to a nurse at all times for urgent questions. Please call the main number to the clinic Dept: [phone] and follow the prompts.   For any non-urgent questions, you may also contact your provider using MyChart. We now offer e-Visits for anyone 18 and older to request care online for non-urgent symptoms. For details visit [email or web address].   Also download the MyChart app! Go to the app store, search "MyChart", open the app, select [facility], and log in with your MyChart username and password.   

## 2022-10-07 ENCOUNTER — Encounter: Payer: Self-pay | Admitting: Hematology

## 2022-10-16 ENCOUNTER — Other Ambulatory Visit: Payer: Self-pay | Admitting: Internal Medicine

## 2022-10-16 DIAGNOSIS — I10 Essential (primary) hypertension: Secondary | ICD-10-CM

## 2022-10-16 DIAGNOSIS — Z0000 Encounter for general adult medical examination without abnormal findings: Secondary | ICD-10-CM

## 2022-10-25 ENCOUNTER — Other Ambulatory Visit: Payer: Self-pay | Admitting: Internal Medicine

## 2022-10-25 DIAGNOSIS — Z1231 Encounter for screening mammogram for malignant neoplasm of breast: Secondary | ICD-10-CM

## 2022-10-27 ENCOUNTER — Ambulatory Visit (INDEPENDENT_AMBULATORY_CARE_PROVIDER_SITE_OTHER): Payer: Medicare HMO | Admitting: Internal Medicine

## 2022-10-27 ENCOUNTER — Encounter: Payer: Self-pay | Admitting: Internal Medicine

## 2022-10-27 VITALS — BP 130/70 | HR 53 | Temp 98.3°F | Ht 67.0 in | Wt 251.7 lb

## 2022-10-27 DIAGNOSIS — I10 Essential (primary) hypertension: Secondary | ICD-10-CM | POA: Diagnosis not present

## 2022-10-27 MED ORDER — LISINOPRIL 40 MG PO TABS: 40.0000 mg | ORAL_TABLET | Freq: Every day | ORAL | 1 refills | Status: DC

## 2022-10-27 MED ORDER — HYDROCHLOROTHIAZIDE 12.5 MG PO TABS: 12.5000 mg | ORAL_TABLET | Freq: Every day | ORAL | 1 refills | Status: DC

## 2022-10-27 NOTE — Assessment & Plan Note (Signed)
Blood pressure is well-controlled, refill hydrochlorothiazide and lisinopril.  Recent lab work shows normal electrolytes and kidney function at baseline.

## 2022-10-27 NOTE — Progress Notes (Signed)
Established Patient Office Visit     CC/Reason for Visit: Medication refills  HPI: Casey Black is a 62 y.o. female who is coming in today for the above mentioned reasons. Past Medical History is significant for: Colon cancer followed by oncology and hypertension.  I have not seen her in over a year.  She is due for refills of lisinopril and hydrochlorothiazide.  She has been doing well other than peripheral neuropathy from her chemotherapy.  She has gained some weight.   Past Medical/Surgical History: Past Medical History:  Diagnosis Date   Anemia    low iron   Colon cancer (HCC) 01/2019   Hypertension 01/23/2019   Personal history of chemotherapy 01/2019   colon CA   SBO (small bowel obstruction) (HCC) 01/23/2019    Past Surgical History:  Procedure Laterality Date   CESAREAN SECTION     x2   COLOSTOMY N/A 01/24/2019   Procedure: End Loop Colostomy;  Surgeon: Berna Bue, MD;  Location: [facility] OR;  Service: General;  Laterality: N/A;   PARTIAL COLECTOMY N/A 01/24/2019   Procedure: PARTIAL COLECTOMY;  Surgeon: Berna Bue, MD;  Location: [facility] OR;  Service: General;  Laterality: N/A;   PORTACATH PLACEMENT Right 02/28/2019   Procedure: INSERTION PORT-A-CATH WITH ULTRASOUND GUIDANCE;  Surgeon: Berna Bue, MD;  Location: [facility] OR;  Service: General;  Laterality: Right;    Social History:  reports that she quit smoking about 3 years ago. Her smoking use included cigarettes. She started smoking about 32 years ago. She has a 14.5 pack-year smoking history. She has never used smokeless tobacco. She reports that she does not currently use alcohol. She reports that she does not use drugs.  Allergies: No Known Allergies  Family History:  Family History  Problem Relation Age of Onset   Heart murmur Mother    Kidney failure Father    Hypertension Father    Hypertension Sister      Current Outpatient Medications:    acetaminophen (TYLENOL) 325 MG tablet,  Take 2 tablets (650 mg total) by mouth every 6 (six) hours as needed., Disp: , Rfl:    amLODipine (NORVASC) 10 MG tablet, TAKE 1 TABLET BY MOUTH EVERY DAY, Disp: 90 tablet, Rfl: 1   capecitabine (XELODA) 500 MG tablet, Take 4 tablets (2,000 mg total) by mouth 2 (two) times daily after a meal. Take for 14 days, then off for 7 days., Disp: 112 tablet, Rfl: 2   ferrous sulfate 325 (65 FE) MG tablet, TAKE 1 TABLET BY MOUTH TWICE A DAY WITH FOOD, Disp: 180 tablet, Rfl: 1   gabapentin (NEURONTIN) 300 MG capsule, Take 1 capsule (300 mg total) by mouth 2 (two) times daily., Disp: 60 capsule, Rfl: 3   hydrALAZINE (APRESOLINE) 25 MG tablet, TAKE 1 TABLET BY MOUTH EVERY 8 HOURS, Disp: 270 tablet, Rfl: 0   hydrocortisone (ANUSOL-HC) 2.5 % rectal cream, Place 1 application rectally 2 (two) times daily., Disp: 30 g, Rfl: 0   Ibuprofen 200 MG CAPS, Take 400 mg by mouth daily as needed (pain)., Disp: , Rfl:    isosorbide mononitrate (IMDUR) 30 MG 24 hr tablet, TAKE 1 TABLET BY MOUTH EVERY DAY, Disp: 90 tablet, Rfl: 0   lidocaine (LMX) 4 % cream, Apply 1 application topically 3 (three) times daily as needed., Disp: 30 g, Rfl: 0   lidocaine-prilocaine (EMLA) cream, APPLY 1 APPLICATION TOPICALLY AS NEEDED., Disp: 30 g, Rfl: 1   polycarbophil (FIBERCON) 625 MG tablet, Take  1 tablet (625 mg total) by mouth daily., Disp: 30 tablet, Rfl: 0   hydrochlorothiazide (HYDRODIURIL) 12.5 MG tablet, Take 1 tablet (12.5 mg total) by mouth daily., Disp: 90 tablet, Rfl: 1   lisinopril (ZESTRIL) 40 MG tablet, Take 1 tablet (40 mg total) by mouth daily., Disp: 90 tablet, Rfl: 1 No current facility-administered medications for this visit.  Facility-Administered Medications Ordered in Other Visits:    sodium chloride flush (NS) 0.9 % injection 10 mL, 10 mL, Intracatheter, PRN, Malachy Mood, MD, 10 mL at 06/06/20 1051  Review of Systems:  Negative unless indicated in HPI.   Physical Exam: Vitals:   10/27/22 0829  BP: 130/70  Pulse:  (!) 53  Temp: 98.3 F (36.8 C)  TempSrc: Oral  SpO2: 99%  Weight: 251 lb 11.2 oz (114.2 kg)  Height: 5\' 7"  (1.702 m)    Body mass index is 39.42 kg/m.   Physical Exam Vitals reviewed.  Constitutional:      Appearance: Normal appearance.  HENT:     Head: Normocephalic and atraumatic.  Eyes:     Conjunctiva/sclera: Conjunctivae normal.     Pupils: Pupils are equal, round, and reactive to light.  Cardiovascular:     Rate and Rhythm: Normal rate and regular rhythm.  Pulmonary:     Effort: Pulmonary effort is normal.     Breath sounds: Normal breath sounds.  Skin:    General: Skin is warm and dry.  Neurological:     General: No focal deficit present.     Mental Status: She is alert and oriented to person, place, and time.  Psychiatric:        Mood and Affect: Mood normal.        Behavior: Behavior normal.        Thought Content: Thought content normal.        Judgment: Judgment normal.      Impression and Plan:  Primary hypertension Assessment & Plan: Blood pressure is well-controlled, refill hydrochlorothiazide and lisinopril.  Recent lab work shows normal electrolytes and kidney function at baseline.  Orders: -     hydroCHLOROthiazide; Take 1 tablet (12.5 mg total) by mouth daily.  Dispense: 90 tablet; Refill: 1 -     Lisinopril; Take 1 tablet (40 mg total) by mouth daily.  Dispense: 90 tablet; Refill: 1     Time spent:22 minutes reviewing chart, interviewing and examining patient and formulating plan of care.     Chaya Jan, MD [facility]

## 2022-10-28 ENCOUNTER — Ambulatory Visit: Payer: Medicare HMO | Admitting: Internal Medicine

## 2022-10-28 ENCOUNTER — Other Ambulatory Visit: Payer: Self-pay

## 2022-11-03 ENCOUNTER — Encounter (HOSPITAL_COMMUNITY): Payer: Self-pay

## 2022-11-03 ENCOUNTER — Ambulatory Visit (HOSPITAL_COMMUNITY)
Admission: RE | Admit: 2022-11-03 | Discharge: 2022-11-03 | Disposition: A | Discharge status: Home / Self Care | Payer: Medicare HMO | Source: Ambulatory Visit | Attending: Hematology | Admitting: Hematology

## 2022-11-03 DIAGNOSIS — C787 Secondary malignant neoplasm of liver and intrahepatic bile duct: Secondary | ICD-10-CM | POA: Insufficient documentation

## 2022-11-03 DIAGNOSIS — I700 Atherosclerosis of aorta: Secondary | ICD-10-CM | POA: Diagnosis not present

## 2022-11-03 DIAGNOSIS — C189 Malignant neoplasm of colon, unspecified: Secondary | ICD-10-CM | POA: Insufficient documentation

## 2022-11-03 DIAGNOSIS — Z933 Colostomy status: Secondary | ICD-10-CM | POA: Diagnosis not present

## 2022-11-03 MED ORDER — HEPARIN SOD (PORK) LOCK FLUSH 100 UNIT/ML IV SOLN
500.0000 [IU] | Freq: Once | INTRAVENOUS | Status: AC
  Administered 2022-11-03: 500 [IU] via INTRAVENOUS

## 2022-11-03 MED ORDER — IOHEXOL 300 MG/ML  SOLN
100.0000 mL | Freq: Once | INTRAMUSCULAR | Status: AC | PRN
  Administered 2022-11-03: 100 mL via INTRAVENOUS

## 2022-11-03 MED ORDER — IOHEXOL 9 MG/ML PO SOLN
ORAL | Status: AC
  Filled 2022-11-03: qty 1000

## 2022-11-03 MED ORDER — IOHEXOL 9 MG/ML PO SOLN
1000.0000 mL | Freq: Once | ORAL | Status: AC
  Administered 2022-11-03: 1000 mL via ORAL

## 2022-11-03 MED ORDER — SODIUM CHLORIDE (PF) 0.9 % IJ SOLN
INTRAMUSCULAR | Status: AC
  Filled 2022-11-03: qty 50

## 2022-11-03 MED ORDER — HEPARIN SOD (PORK) LOCK FLUSH 100 UNIT/ML IV SOLN
INTRAVENOUS | Status: AC
  Filled 2022-11-03: qty 5

## 2022-11-08 ENCOUNTER — Ambulatory Visit
Admission: RE | Admit: 2022-11-08 | Discharge: 2022-11-08 | Disposition: A | Discharge status: Home / Self Care | Payer: Medicare HMO | Source: Ambulatory Visit | Attending: Internal Medicine | Admitting: Internal Medicine

## 2022-11-08 DIAGNOSIS — Z1231 Encounter for screening mammogram for malignant neoplasm of breast: Secondary | ICD-10-CM

## 2022-11-10 ENCOUNTER — Telehealth: Payer: Self-pay | Admitting: Hematology

## 2022-11-10 ENCOUNTER — Other Ambulatory Visit: Payer: Self-pay

## 2022-11-14 NOTE — Assessment & Plan Note (Signed)
-  secondary to chemo -on gabapentin, overall mild and stable  

## 2022-11-14 NOTE — Assessment & Plan Note (Signed)
[id number] stage IV with liver and nodal metastasis, MSS, KRAS G12S(+)  -Diagnosed in 01/2019 after emergent colectomy and liver biopsy. 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  -Started first line chemo on 03/12/2019, received 5FU/leuc with first 2 cycles for large open abdominal wound after surgery. She started full dose FOLFOX and avastin with cycle 2. Due to neuropathy Oxaliplatin was stopped after 09/24/19.  --For convenience she was switched to oral chemo Xeloda 2 weeks on/1 week off and Bev every 3 weeks in 01/21/20. She is tolerating well -Her restaging CT scan from 11/03/2022 showed stable liver metastasis, no other new lesions. I personally reviewed with pt  -She is clinically doing well, will continue current therapy. -Since she is tolerating treatment very well, I will see her every other treatment (6 week)

## 2022-11-14 NOTE — Assessment & Plan Note (Signed)
-  We discussed the incurable nature of her cancer, and the overall poor prognosis, especially if she does not have good response to chemotherapy or progress on chemo -The patient understands the goal of care is palliative. -I encourage her to have a living will  -she is full code

## 2022-11-14 NOTE — Assessment & Plan Note (Signed)
-  on hydralazine, isosorbide, lisinopril, amlodipine and HCTZ  -she monitors at home where it is normal -Will monitor on bevacizumab.    

## 2022-11-15 ENCOUNTER — Inpatient Hospital Stay: Payer: Medicare HMO | Attending: Hematology

## 2022-11-15 ENCOUNTER — Encounter: Payer: Self-pay | Admitting: Hematology

## 2022-11-15 ENCOUNTER — Other Ambulatory Visit: Payer: Self-pay

## 2022-11-15 ENCOUNTER — Inpatient Hospital Stay (HOSPITAL_BASED_OUTPATIENT_CLINIC_OR_DEPARTMENT_OTHER): Payer: Medicare HMO | Admitting: Hematology

## 2022-11-15 ENCOUNTER — Inpatient Hospital Stay: Payer: Medicare HMO

## 2022-11-15 VITALS — BP 138/56 | HR 64 | Temp 97.6°F | Resp 18 | Ht 67.0 in | Wt 255.9 lb

## 2022-11-15 DIAGNOSIS — Z79899 Other long term (current) drug therapy: Secondary | ICD-10-CM | POA: Diagnosis not present

## 2022-11-15 DIAGNOSIS — S31109A Unspecified open wound of abdominal wall, unspecified quadrant without penetration into peritoneal cavity, initial encounter: Secondary | ICD-10-CM | POA: Diagnosis not present

## 2022-11-15 DIAGNOSIS — C184 Malignant neoplasm of transverse colon: Secondary | ICD-10-CM | POA: Diagnosis not present

## 2022-11-15 DIAGNOSIS — C787 Secondary malignant neoplasm of liver and intrahepatic bile duct: Secondary | ICD-10-CM

## 2022-11-15 DIAGNOSIS — Z5112 Encounter for antineoplastic immunotherapy: Secondary | ICD-10-CM | POA: Insufficient documentation

## 2022-11-15 DIAGNOSIS — Z95828 Presence of other vascular implants and grafts: Secondary | ICD-10-CM

## 2022-11-15 DIAGNOSIS — T451X5A Adverse effect of antineoplastic and immunosuppressive drugs, initial encounter: Secondary | ICD-10-CM | POA: Diagnosis not present

## 2022-11-15 DIAGNOSIS — Z7189 Other specified counseling: Secondary | ICD-10-CM | POA: Diagnosis not present

## 2022-11-15 DIAGNOSIS — K56609 Unspecified intestinal obstruction, unspecified as to partial versus complete obstruction: Secondary | ICD-10-CM | POA: Diagnosis not present

## 2022-11-15 DIAGNOSIS — C189 Malignant neoplasm of colon, unspecified: Secondary | ICD-10-CM | POA: Diagnosis not present

## 2022-11-15 DIAGNOSIS — I10 Essential (primary) hypertension: Secondary | ICD-10-CM | POA: Diagnosis not present

## 2022-11-15 DIAGNOSIS — Z933 Colostomy status: Secondary | ICD-10-CM | POA: Diagnosis not present

## 2022-11-15 DIAGNOSIS — T8131XD Disruption of external operation (surgical) wound, not elsewhere classified, subsequent encounter: Secondary | ICD-10-CM | POA: Diagnosis not present

## 2022-11-15 DIAGNOSIS — G620 Drug-induced polyneuropathy: Secondary | ICD-10-CM

## 2022-11-15 LAB — CEA (ACCESS): CEA (CHCC): 143.25 ng/mL — ABNORMAL HIGH (ref 0.00–5.00)

## 2022-11-15 LAB — COMPREHENSIVE METABOLIC PANEL
ALT: 11 U/L (ref 0–44)
AST: 19 U/L (ref 15–41)
Albumin: 3.8 g/dL (ref 3.5–5.0)
Alkaline Phosphatase: 82 U/L (ref 38–126)
Anion gap: 6 (ref 5–15)
BUN: 26 mg/dL — ABNORMAL HIGH (ref 8–23)
CO2: 22 mmol/L (ref 22–32)
Calcium: 9.4 mg/dL (ref 8.9–10.3)
Chloride: 111 mmol/L (ref 98–111)
Creatinine, Ser: 1.07 mg/dL — ABNORMAL HIGH (ref 0.44–1.00)
GFR, Estimated: 59 mL/min — ABNORMAL LOW (ref >60)
Glucose, Bld: 110 mg/dL — ABNORMAL HIGH (ref 70–99)
Potassium: 4.1 mmol/L (ref 3.5–5.1)
Sodium: 139 mmol/L (ref 135–145)
Total Bilirubin: 0.5 mg/dL (ref 0.3–1.2)
Total Protein: 7.6 g/dL (ref 6.5–8.1)

## 2022-11-15 LAB — CBC WITH DIFFERENTIAL (CANCER CENTER ONLY)
Abs Immature Granulocytes: 0.02 10*3/uL (ref 0.00–0.07)
Basophils Absolute: 0.1 10*3/uL (ref 0.0–0.1)
Basophils Relative: 1 %
Eosinophils Absolute: 0.2 10*3/uL (ref 0.0–0.5)
Eosinophils Relative: 3 %
HCT: 34.7 % — ABNORMAL LOW (ref 36.0–46.0)
Hemoglobin: 11.4 g/dL — ABNORMAL LOW (ref 12.0–15.0)
Immature Granulocytes: 0 %
Lymphocytes Relative: 29 %
Lymphs Abs: 2.1 10*3/uL (ref 0.7–4.0)
MCH: 33.1 pg (ref 26.0–34.0)
MCHC: 32.9 g/dL (ref 30.0–36.0)
MCV: 100.9 fL — ABNORMAL HIGH (ref 80.0–100.0)
Monocytes Absolute: 0.5 10*3/uL (ref 0.1–1.0)
Monocytes Relative: 7 %
Neutro Abs: 4.4 10*3/uL (ref 1.7–7.7)
Neutrophils Relative %: 60 %
Platelet Count: 184 10*3/uL (ref 150–400)
RBC: 3.44 MIL/uL — ABNORMAL LOW (ref 3.87–5.11)
RDW: 16 % — ABNORMAL HIGH (ref 11.5–15.5)
WBC Count: 7.3 10*3/uL (ref 4.0–10.5)
nRBC: 0 % (ref 0.0–0.2)

## 2022-11-15 LAB — TOTAL PROTEIN, URINE DIPSTICK: Protein, ur: NEGATIVE mg/dL

## 2022-11-15 MED ORDER — SODIUM CHLORIDE 0.9% FLUSH
10.0000 mL | INTRAVENOUS | Status: DC | PRN
  Administered 2022-11-15: 10 mL

## 2022-11-15 MED ORDER — HEPARIN SOD (PORK) LOCK FLUSH 100 UNIT/ML IV SOLN
500.0000 [IU] | Freq: Once | INTRAVENOUS | Status: AC | PRN
  Administered 2022-11-15: 500 [IU]

## 2022-11-15 MED ORDER — SODIUM CHLORIDE 0.9 % IV SOLN: Freq: Once | INTRAVENOUS | Status: AC

## 2022-11-15 MED ORDER — SODIUM CHLORIDE 0.9 % IV SOLN
7.5000 mg/kg | Freq: Once | INTRAVENOUS | Status: AC
  Administered 2022-11-15: 800 mg via INTRAVENOUS
  Filled 2022-11-15: qty 32

## 2022-11-15 NOTE — Patient Instructions (Signed)
[facility]  Discharge Instructions: Thank you for choosing [facility] to provide your oncology and hematology care.   If you have a lab appointment with the [facility], please go directly to the [facility] and check in at the registration area.   Wear comfortable clothing and clothing appropriate for easy access to any Portacath or PICC line.   We strive to give you quality time with your provider. You may need to reschedule your appointment if you arrive late (15 or more minutes).  Arriving late affects you and other patients whose appointments are after yours.  Also, if you miss three or more appointments without notifying the office, you may be dismissed from the clinic at the provider's discretion.      For prescription refill requests, have your pharmacy contact our office and allow 72 hours for refills to be completed.    Today you received the following chemotherapy and/or immunotherapy agents: Bevacizumab      To help prevent nausea and vomiting after your treatment, we encourage you to take your nausea medication as directed.  BELOW ARE SYMPTOMS THAT SHOULD BE REPORTED IMMEDIATELY: *FEVER GREATER THAN 100.4 F (38 C) OR HIGHER *CHILLS OR SWEATING *NAUSEA AND VOMITING THAT IS NOT CONTROLLED WITH YOUR NAUSEA MEDICATION *UNUSUAL SHORTNESS OF BREATH *UNUSUAL BRUISING OR BLEEDING *URINARY PROBLEMS (pain or burning when urinating, or frequent urination) *BOWEL PROBLEMS (unusual diarrhea, constipation, pain near the anus) TENDERNESS IN MOUTH AND THROAT WITH OR WITHOUT PRESENCE OF ULCERS (sore throat, sores in mouth, or a toothache) UNUSUAL RASH, SWELLING OR PAIN  UNUSUAL VAGINAL DISCHARGE OR ITCHING   Items with * indicate a potential emergency and should be followed up as soon as possible or go to the Emergency Department if any problems should occur.  Please show the CHEMOTHERAPY ALERT CARD or IMMUNOTHERAPY ALERT CARD at  check-in to the Emergency Department and triage nurse.  Should you have questions after your visit or need to cancel or reschedule your appointment, please contact [facility]  Dept: [phone]  and follow the prompts.  Office hours are 8:00 a.m. to 4:30 p.m. Monday - Friday. Please note that voicemails left after 4:00 p.m. may not be returned until the following business day.  We are closed weekends and major holidays. You have access to a nurse at all times for urgent questions. Please call the main number to the clinic Dept: [phone] and follow the prompts.   For any non-urgent questions, you may also contact your provider using MyChart. We now offer e-Visits for anyone 82 and older to request care online for non-urgent symptoms. For details visit [email or web address].   Also download the MyChart app! Go to the app store, search "MyChart", open the app, select [facility], and log in with your MyChart username and password.

## 2022-11-15 NOTE — Progress Notes (Signed)
[facility]   Telephone:(336) [phone] [phone]   Clinic Follow up Note   Patient Care Team: Philip Aspen, Limmie Patricia, MD as PCP - General (Internal Medicine) Rollene Rotunda, MD as PCP - Cardiology (Cardiology) Berna Bue, MD as Consulting Physician (General Surgery) Malachy Mood, MD as Consulting Physician (Hematology) Pollyann Samples, NP as Nurse Practitioner (Nurse Practitioner)  Date of Service:  11/15/2022  CHIEF COMPLAINT: f/u of metastatic colon cancer      CURRENT THERAPY:  Beva and Xeloda, q3weeks, starting 01/21/20             -Xeloda dose: 2000mg  q12h on day 1-14     ASSESSMENT:  Casey Black is a 62 y.o. 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  -Started first line chemo on 03/12/2019, received 5FU/leuc with first 2 cycles for large open abdominal wound after surgery. She started full dose FOLFOX and avastin with cycle 2. Due to neuropathy Oxaliplatin was stopped after 09/24/19.  --For convenience she was switched to oral chemo Xeloda 2 weeks on/1 week off and Bev every 3 weeks in 01/21/20. She is tolerating well -Her restaging CT scan from 11/03/2022 showed stable liver metastasis, no other new lesions. I personally reviewed with pt  -She is clinically doing well, will continue current therapy. 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 -I encourage her to have a living will  -she is full code     Hypertension -on hydralazine, isosorbide, lisinopril, amlodipine and HCTZ  -she monitors at home where it is normal -Will monitor on bevacizumab.      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 Bowel-gas pattern consistent with small bowel obstruction. No free air. 2. No acute chest findings.   01/23/2019 Imaging   CT AP IMPRESSION: Obstructing mid transverse colonic mass with mild regional adenopathy and hepatic metastatic disease. The mass likely extends through the serosa; no ascites or peritoneal nodularity.   01/24/2019 Surgery   Surgeon: Berna Bue MD Assistant: Mattie Marlin PA-C Procedure performed: Transverse colectomy with end colostomy, liver biopsy Procedure classification: URGENT/EMERGENT Preop diagnosis: Obstructing, metastatic transverse colon mass Post-op diagnosis/intraop findings: Same   01/24/2019 Pathology Results   FINAL MICROSCOPIC DIAGNOSIS:   A. COLON, TRANSVERSE, RESECTION:  Colonic adenocarcinoma, 5 cm.  Carcinoma extends into pericolonic connective tissue and focally to  serosal surface.  Margins not involved.  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  B. LIVER NODULE, LEFT, BIOPSY:  Metastatic adenocarcinoma.    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 Hypermetabolic metastatic disease in the liver and mediastinal/hilar/axillary lymph nodes. 2. Focal hypermetabolism in the rectum. Continued attention on follow-up exams is warranted. 3. Focal hypermetabolism medial to the right adrenal gland may be within a metastatic lymph node, better visualized on 01/23/2019. 4. Aortic atherosclerosis (ICD10-170.0). Coronary artery calcification.   03/12/2019 -  Chemotherapy   She started 5FU q2weeks on 03/12/19 for 2 cycles. She started full dose FOLFOX with Avastin on 04/09/19. Oxaliplatin dose reduced repeatedly due to neuropathy C12 and held since C16 on 10/09/19. Now on maintenance Avastin and 5FU q2weeks since 10/09/19       -Maintenance change to maintenance xeloda 2000 mg BID days 1-14 q21 days and q3 weeks Zirabev (15 mg/kg) starting 01/21/20. First cycle was taken 1000mg  BID due to misunderstanding.    05/31/2019 Imaging   Restaging CT CAP IMPRESSION: 1. Similar to mild interval decrease in size of multiple hepatic lesions, partially calcified. 2. 2 mm right upper lobe pulmonary nodule. Recommend attention on follow-up. 3. Emphysema and aortic atherosclerosis.   08/23/2019 Imaging   CT CAP w contrast  IMPRESSION: 1. The dominant peripheral right liver metastasis has mildly increased. Other smaller liver metastases are stable. 2. Otherwise no new or progressive metastatic disease in the chest, abdomen or pelvis. 3. Aortic Atherosclerosis (ICD10-I70.0) and Emphysema (ICD10-J43.9).   11/27/2019 Imaging   CT CAP w contrast  IMPRESSION: Status post transverse colectomy with right mid abdominal colostomy.   Mildly progressive hepatic metastases, as above.   No evidence of metastatic disease in the chest. Small mediastinal lymph nodes are within normal limits.    Additional stable ancillary findings as above.   01/21/2020 -  Chemotherapy   Patient is on Treatment Plan : COLORECTAL Bevacizumab q21d     02/21/2020 Imaging   IMPRESSION: 1. Stable hepatic metastatic disease. 2. Aortic atherosclerosis (ICD10-I70.0). Coronary artery calcification. 3.  Emphysema (ICD10-J43.9).   05/19/2020 Imaging   CT CAP  IMPRESSION: 1. Multiple partially calcified liver metastases are again noted. With the exception of a small lesion in segment 7/8 lesions are not significantly changed in the interval. No new liver lesions identified. 2. Coronary artery atherosclerotic calcifications. 3. Aortic atherosclerosis. 4. 2 mm right upper lobe lung nodule identified.  Unchanged.   Aortic Atherosclerosis (ICD10-I70.0).   11/17/2020 Imaging   CT CAP  IMPRESSION: 1. Partially calcified lesions throughout the liver are stable accounting for differences in technique, contrasted imaging on today's study is compared to noncontrast imaging on the prior. 2. No new hepatic lesions. 3. Tiny 3 mm pulmonary nodule in the RIGHT upper lobe unchanged since the prior study. Attention on follow-up. 4. Mild fullness of RIGHT paratracheal nodal tissue is minimally increased and borderline enlarged, attention on follow-up. 5. RIGHT lower quadrant colostomy. 6. 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 7. Aortic atherosclerosis.   06/09/2021 Imaging   EXAM: CT CHEST, ABDOMEN, AND PELVIS WITH CONTRAST  IMPRESSION: Stable calcified liver metastases.   Stable mildly enlarged right paratracheal lymph node.   No  evidence of new or progressive metastatic disease.   Aortic Atherosclerosis (ICD10-I70.0).   09/14/2021 PET scan   IMPRESSION: Multiple hypermetabolic and partially calcified liver metastases, without significant change compared to most recent CT.    Mild hypermetabolic mediastinal and bilateral hilar lymphadenopathy, also without significant change.   No evidence of new or progressive metastatic disease.     Electronically Signed   By: Danae Orleans M.D.   On: 09/15/2021 11:04   04/15/2022 Imaging    IMPRESSION: 1. Several previously noted partially calcified hepatic metastases appear grossly stable compared to the prior examination. No new hepatic lesions or other signs of metastatic disease noted elsewhere in the chest, abdomen or pelvis. 2. Aortic atherosclerosis, in addition to left main and three-vessel coronary artery disease. Please note that although the presence of coronary artery calcium documents the presence of coronary artery disease, the severity of this disease and any potential stenosis cannot be assessed on this non-gated CT examination. Assessment for potential risk factor modification, dietary therapy or pharmacologic therapy may be warranted, if clinically indicated. 3. There are calcifications of the mitral annulus. Echocardiographic correlation for evaluation of potential valvular dysfunction may be warranted if clinically indicated. 4. Additional incidental findings, similar to prior studies, as above.   07/15/2022 Imaging    IMPRESSION: 1. Stable exam. No new or progressive findings in the abdomen or pelvis to suggest worsening disease. 2. No substantial change in calcified liver metastases. 3. Right abdominal transverse end colostomy with parastomal herniation of fat and colon proximal to the stoma. 4. Probable left-sided uterine fibroid measuring on the order of 3.1 cm and displacing the endometrial stripe to the right. Pelvic ultrasound could be used to further evaluate as clinically warranted. 5.  Aortic Atherosclerosis (ICD10-I70.0).        INTERVAL HISTORY:  Casey Black is here for a follow up of  metastatic colon cancer . She was last seen by me on 10/06/2022. She presents to  the clinic alone. Patient reports doing well overall.    All other systems were reviewed with the patient and are negative.  MEDICAL HISTORY:  Past Medical History:  Diagnosis Date   Anemia    low iron   Colon cancer (HCC) 01/2019   Hypertension 01/23/2019   Personal history of chemotherapy 01/2019   colon CA   SBO (small bowel obstruction) (HCC) 01/23/2019    SURGICAL HISTORY: Past Surgical History:  Procedure Laterality Date   CESAREAN SECTION     x2   COLOSTOMY N/A 01/24/2019   Procedure: End Loop Colostomy;  Surgeon: Berna Bue, MD;  Location: [facility] OR;  Service: General;  Laterality: N/A;   PARTIAL COLECTOMY N/A 01/24/2019   Procedure: PARTIAL COLECTOMY;  Surgeon: Berna Bue, MD;  Location: [facility] OR;  Service: General;  Laterality: N/A;   PORTACATH PLACEMENT Right 02/28/2019   Procedure: INSERTION PORT-A-CATH WITH ULTRASOUND GUIDANCE;  Surgeon: Berna Bue, MD;  Location: [facility] OR;  Service: General;  Laterality: Right;    I have reviewed the social history and family history with the patient and they are unchanged from previous note.  ALLERGIES:  has No Known Allergies.  MEDICATIONS:  Current Outpatient Medications  Medication Sig Dispense Refill   acetaminophen (TYLENOL) 325 MG tablet Take 2 tablets (650 mg total) by mouth every 6 (six) hours as needed.     amLODipine (NORVASC) 10 MG tablet TAKE 1 TABLET BY MOUTH EVERY DAY 90 tablet 1   capecitabine (XELODA) 500 MG tablet Take  4 tablets (2,000 mg total) by mouth 2 (two) times daily after a meal. Take for 14 days, then off for 7 days. 112 tablet 2   ferrous sulfate 325 (65 FE) MG tablet TAKE 1 TABLET BY MOUTH TWICE A DAY WITH FOOD 180 tablet 1   gabapentin (NEURONTIN) 300 MG capsule Take 1 capsule (300 mg total) by mouth 2 (two) times daily. 60 capsule 3   hydrALAZINE (APRESOLINE) 25 MG tablet TAKE 1 TABLET BY MOUTH EVERY 8 HOURS 270 tablet 0   hydrochlorothiazide (HYDRODIURIL) 12.5 MG tablet Take 1 tablet  (12.5 mg total) by mouth daily. 90 tablet 1   hydrocortisone (ANUSOL-HC) 2.5 % rectal cream Place 1 application rectally 2 (two) times daily. 30 g 0   Ibuprofen 200 MG CAPS Take 400 mg by mouth daily as needed (pain).     isosorbide mononitrate (IMDUR) 30 MG 24 hr tablet TAKE 1 TABLET BY MOUTH EVERY DAY 90 tablet 0   lidocaine (LMX) 4 % cream Apply 1 application topically 3 (three) times daily as needed. 30 g 0   lidocaine-prilocaine (EMLA) cream APPLY 1 APPLICATION TOPICALLY AS NEEDED. 30 g 1   lisinopril (ZESTRIL) 40 MG tablet Take 1 tablet (40 mg total) by mouth daily. 90 tablet 1   polycarbophil (FIBERCON) 625 MG tablet Take 1 tablet (625 mg total) by mouth daily. 30 tablet 0   No current facility-administered medications for this visit.   Facility-Administered Medications Ordered in Other Visits  Medication Dose Route Frequency Provider Last Rate Last Admin   sodium chloride flush (NS) 0.9 % injection 10 mL  10 mL Intracatheter PRN Malachy Mood, MD   10 mL at 06/06/20 1051   sodium chloride flush (NS) 0.9 % injection 10 mL  10 mL Intracatheter PRN Malachy Mood, MD   10 mL at 11/15/22 1123    PHYSICAL EXAMINATION: ECOG PERFORMANCE STATUS: 0 - Asymptomatic  Vitals:   11/15/22 0932  BP: (!) 138/56  Pulse: 64  Resp: 18  Temp: 97.6 F (36.4 C)  SpO2: 97%   Wt Readings from Last 3 Encounters:  11/15/22 255 lb 14.4 oz (116.1 kg)  10/27/22 251 lb 11.2 oz (114.2 kg)  10/06/22 253 lb 3.2 oz (114.9 kg)     GENERAL:alert, no distress and comfortable SKIN: skin color, texture, turgor are normal, no rashes or significant lesions EYES: normal, Conjunctiva are pink and non-injected, sclera clear NECK: supple, thyroid normal size, non-tender, without nodularity LYMPH:  no palpable lymphadenopathy in the cervical, axillary  LUNGS: clear to auscultation and percussion with normal breathing effort HEART: regular rate & rhythm and no murmurs and no lower extremity edema ABDOMEN:abdomen soft,  non-tender and normal bowel sounds Musculoskeletal:no cyanosis of digits and no clubbing  NEURO: alert & oriented x 3 with fluent speech, no focal motor/sensory deficits  LABORATORY DATA:  I have reviewed the data as listed    Latest Ref Rng & Units 11/15/2022    9:15 AM 10/06/2022    8:00 AM 09/16/2022    2:46 PM  CBC  WBC 4.0 - 10.5 K/uL 7.3  6.4  7.7   Hemoglobin 12.0 - 15.0 g/dL 29.5  62.1  30.8   Hematocrit 36.0 - 46.0 % 34.7  35.5  33.8   Platelets 150 - 400 K/uL 184  180  195         Latest Ref Rng & Units 11/15/2022    9:15 AM 10/06/2022    8:00 AM 09/16/2022    2:46 PM  CMP  Glucose 70 - 99 mg/dL 409  71  95   BUN 8 - 23 mg/dL 26  32  21   Creatinine 0.44 - 1.00 mg/dL 8.11  9.14  7.82   Sodium 135 - 145 mmol/L 139  138  137   Potassium 3.5 - 5.1 mmol/L 4.1  4.6  4.3   Chloride 98 - 111 mmol/L 111  113  109   CO2 22 - 32 mmol/L 22  20  24    Calcium 8.9 - 10.3 mg/dL 9.4  9.6  9.9   Total Protein 6.5 - 8.1 g/dL 7.6  7.3  7.8   Total Bilirubin 0.3 - 1.2 mg/dL 0.5  0.4  0.5   Alkaline Phos 38 - 126 U/L 82  87  82   AST 15 - 41 U/L 19  17  19    ALT 0 - 44 U/L 11  10  12        RADIOGRAPHIC STUDIES: I have personally reviewed the radiological images as listed and agreed with the findings in the report. No results found.    Orders Placed This Encounter  Procedures   Total Protein, Urine dipstick    Standing Status:   Future    Number of Occurrences:   1    Standing Expiration Date:   11/15/2023   CBC with Differential (Cancer Center Only)    Standing Status:   Future    Standing Expiration Date:   11/16/2023   Total Protein, Urine dipstick    Standing Status:   Future    Standing Expiration Date:   11/16/2023   CBC with Differential (Cancer Center Only)    Standing Status:   Future    Standing Expiration Date:   12/09/2023   Total Protein, Urine dipstick    Standing Status:   Future    Standing Expiration Date:   12/09/2023   CBC with Differential (Cancer Center  Only)    Standing Status:   Future    Standing Expiration Date:   12/30/2023   Total Protein, Urine dipstick    Standing Status:   Future    Standing Expiration Date:   12/30/2023   CBC with Differential (Cancer Center Only)    Standing Status:   Future    Standing Expiration Date:   01/20/2024   Total Protein, Urine dipstick    Standing Status:   Future    Standing Expiration Date:   01/20/2024   All questions were answered. The patient knows to call the clinic with any problems, questions or concerns. No barriers to learning was detected. The total time spent in the appointment was 25 minutes.     Malachy Mood, MD 11/15/2022   I, Sharlette Dense, CMA, am acting as scribe for Malachy Mood, MD.   I have reviewed the above documentation for accuracy and completeness, and I agree with the above.

## 2022-11-15 NOTE — Progress Notes (Signed)
Per Dr Mosetta Putt, no Urine Protein today.

## 2022-12-02 ENCOUNTER — Encounter: Payer: Self-pay | Admitting: Family Medicine

## 2022-12-02 ENCOUNTER — Telehealth: Payer: Self-pay

## 2022-12-02 ENCOUNTER — Telehealth (INDEPENDENT_AMBULATORY_CARE_PROVIDER_SITE_OTHER): Payer: Medicare HMO | Admitting: Family Medicine

## 2022-12-02 ENCOUNTER — Telehealth: Payer: Self-pay | Admitting: Hematology

## 2022-12-02 VITALS — BP 131/57 | HR 73 | Ht 67.0 in

## 2022-12-02 DIAGNOSIS — U071 COVID-19: Secondary | ICD-10-CM | POA: Diagnosis not present

## 2022-12-02 DIAGNOSIS — R051 Acute cough: Secondary | ICD-10-CM | POA: Diagnosis not present

## 2022-12-02 DIAGNOSIS — C787 Secondary malignant neoplasm of liver and intrahepatic bile duct: Secondary | ICD-10-CM

## 2022-12-02 MED ORDER — BENZONATATE 100 MG PO CAPS
100.0000 mg | ORAL_CAPSULE | Freq: Two times a day (BID) | ORAL | 0 refills | Status: DC | PRN
Start: 2022-12-02 — End: 2024-01-23

## 2022-12-02 MED ORDER — NIRMATRELVIR/RITONAVIR (PAXLOVID) TABLET (RENAL DOSING)
2.0000 | ORAL_TABLET | Freq: Two times a day (BID) | ORAL | 0 refills | Status: AC
Start: 2022-12-02 — End: 2022-12-07

## 2022-12-02 NOTE — Telephone Encounter (Signed)
Spoke with pt's daughter regarding pt's recent dx of Covid.  Pt's daughter stated the pt's symptoms started on Tuesday, 11/30/2022 but pt did not test until today 12/02/2022 which came back positive.  Pt's daughter wanted to make Dr. Latanya Maudlin office aware of the pt's recent dx.  Pt's daughter will contact pt's PCP for a prescription for Paxlovid.  Instructed pt's daughter to hold/stop the Capecitabine since the pt is having elevated fevers.  Stated this nurse will make Dr. Mosetta Putt aware and we will reschedule the pt's appt on 12/08/2022 to a later date.  Pt's daughter verbalized understanding and had no further questions or concerns.

## 2022-12-02 NOTE — Progress Notes (Signed)
Virtual Visit via Video Note  I connected with Casey Black on 12/02/22 at 11:45 AM EDT by a video enabled telemedicine application and verified that I am speaking with the correct person using two identifiers.  Location patient: home Location provider:work or home office Persons participating in the virtual visit: patient, provider  I discussed the limitations of evaluation and management by telemedicine and the availability of in person appointments. The patient expressed understanding and agreed to proceed.  Chief Complaint  Patient presents with   Covid Positive    Started yesterday with a sore throat, Patient has been getting headaches, Temp, Sore, coughing,     HPI: Patient is a 62 year old female followed by Dr. Ardyth Harps and seen for acute concern.  ST started yesterday.  By that evening pt developed fever, cough, chills, and HA.  Pt took an at home COVID test which was positive.  Notes slight improvement in symptoms today, but still having a  mildly productive cough, HA, ear pressure, nasal congestion.  Taking Tylenol, Hall's cough drops, sore throat spray.  Denies N/V, SOB, ear pain.  Appetite decreased.  Patient drinking fluids.  Had chicken broth yesterday.  Pt has a colostomy bag, notes stools have been loose.   Pt's daughter was around her boss who had COVID, but she did not develop symptoms.  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  ROS: See pertinent positives and negatives per HPI.  Past Medical History:  Diagnosis Date   Anemia    low iron   Colon cancer (HCC) 01/2019   Hypertension 01/23/2019   Personal history of chemotherapy 01/2019   colon CA   SBO (small bowel obstruction) (HCC) 01/23/2019    Past Surgical History:  Procedure Laterality Date   CESAREAN SECTION     x2   COLOSTOMY N/A 01/24/2019   Procedure: End Loop Colostomy;  Surgeon: Berna Bue, MD;  Location: [facility] OR;  Service: General;  Laterality: N/A;   PARTIAL COLECTOMY  N/A 01/24/2019   Procedure: PARTIAL COLECTOMY;  Surgeon: Berna Bue, MD;  Location: [facility] OR;  Service: General;  Laterality: N/A;   PORTACATH PLACEMENT Right 02/28/2019   Procedure: INSERTION PORT-A-CATH WITH ULTRASOUND GUIDANCE;  Surgeon: Berna Bue, MD;  Location: [facility] OR;  Service: General;  Laterality: Right;    Family History  Problem Relation Age of Onset   Heart murmur Mother    Kidney failure Father    Hypertension Father    Hypertension Sister    Current Outpatient Medications:    acetaminophen (TYLENOL) 325 MG tablet, Take 2 tablets (650 mg total) by mouth every 6 (six) hours as needed., Disp: , Rfl:    amLODipine (NORVASC) 10 MG tablet, TAKE 1 TABLET BY MOUTH EVERY DAY, Disp: 90 tablet, Rfl: 1   capecitabine (XELODA) 500 MG tablet, Take 4 tablets (2,000 mg total) by mouth 2 (two) times daily after a meal. Take for 14 days, then off for 7 days., Disp: 112 tablet, Rfl: 2   ferrous sulfate 325 (65 FE) MG tablet, TAKE 1 TABLET BY MOUTH TWICE A DAY WITH FOOD, Disp: 180 tablet, Rfl: 1   gabapentin (NEURONTIN) 300 MG capsule, Take 1 capsule (300 mg total) by mouth 2 (two) times daily., Disp: 60 capsule, Rfl: 3   hydrALAZINE (APRESOLINE) 25 MG tablet, TAKE 1 TABLET BY MOUTH EVERY 8 HOURS, Disp: 270 tablet, Rfl: 0   hydrochlorothiazide (HYDRODIURIL) 12.5 MG tablet, Take 1 tablet (12.5 mg total) by mouth daily., Disp: 90  tablet, Rfl: 1   hydrocortisone (ANUSOL-HC) 2.5 % rectal cream, Place 1 application rectally 2 (two) times daily., Disp: 30 g, Rfl: 0   Ibuprofen 200 MG CAPS, Take 400 mg by mouth daily as needed (pain)., Disp: , Rfl:    isosorbide mononitrate (IMDUR) 30 MG 24 hr tablet, TAKE 1 TABLET BY MOUTH EVERY DAY, Disp: 90 tablet, Rfl: 0   lidocaine (LMX) 4 % cream, Apply 1 application topically 3 (three) times daily as needed., Disp: 30 g, Rfl: 0   lidocaine-prilocaine (EMLA) cream, APPLY 1 APPLICATION TOPICALLY AS NEEDED., Disp: 30 g, Rfl: 1   lisinopril (ZESTRIL) 40 MG  tablet, Take 1 tablet (40 mg total) by mouth daily., Disp: 90 tablet, Rfl: 1   polycarbophil (FIBERCON) 625 MG tablet, Take 1 tablet (625 mg total) by mouth daily., Disp: 30 tablet, Rfl: 0 No current facility-administered medications for this visit.  Facility-Administered Medications Ordered in Other Visits:    sodium chloride flush (NS) 0.9 % injection 10 mL, 10 mL, Intracatheter, PRN, Malachy Mood, MD, 10 mL at 06/06/20 1051  EXAM:  VITALS per patient if applicable:  RR between 12-20 bpm  GENERAL: alert, oriented, appears well and in no acute distress  HEENT: atraumatic, conjunctiva clear, no obvious abnormalities on inspection of external nose and ears  NECK: normal movements of the head and neck  LUNGS: on inspection no signs of respiratory distress, breathing rate appears normal, no obvious gross SOB, gasping or wheezing  CV: no obvious cyanosis  MS: moves all visible extremities without noticeable abnormality  PSYCH/NEURO: pleasant and cooperative, no obvious depression or anxiety, speech and thought processing grossly intact  ASSESSMENT AND PLAN:  Discussed the following assessment and plan:  COVID-19 virus infection - Plan: nirmatrelvir/ritonavir, renal dosing, (PAXLOVID) 10 x 150 MG & 10 x 100MG  TABS  Acute cough - Plan: benzonatate (TESSALON) 100 MG capsule  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  Patient with positive home COVID testing on 12/01/2022.  Discussed r/b/a of antiviral medication.  Patient wishes to start.  Continue supportive care.  Given strict precautions.  Follow-up as needed   I discussed the assessment and treatment plan with the patient. The patient was provided an opportunity to ask questions and all were answered. The patient agreed with the plan and demonstrated an understanding of the instructions.   The patient was advised to call back or seek an in-person  evaluation if the symptoms worsen or if the condition fails to improve as anticipated.   Deeann Saint, MD

## 2022-12-08 ENCOUNTER — Inpatient Hospital Stay: Payer: Medicare HMO

## 2022-12-13 ENCOUNTER — Telehealth: Payer: Self-pay

## 2022-12-13 NOTE — Telephone Encounter (Signed)
Spoke with Casey Black via telephone regarding recovery from recent Covid.  Casey Black stated she's back to normal and doing well.  Casey Black stated she's been fever free for more than 2 days.  Stated that Dr. Mosetta Putt would like for the Casey Black to restart the Capecitabine at her normal dose starting on 12/15/2022 through 12/21/2022.  Stated Dr. Mosetta Putt would like for the Casey Black to take Capecitabine for 7 days and then stop the Capecitabine for 7 days.  Stated Casey Black will be off the Capecitabine at least 1wk before her next cycle which will start 12/29/2022.  Reminded Casey Black of appts scheduled on 12/29/2022.  Instructed Casey Black to restart the Capecitabine as normal on 12/29/2022 which is the same day of her Bevacizumab infusion.  Stated Casey Black's schedule will be as it was prior to Covid delay.  Casey Black verbalized understanding and confirmed appts on 12/29/2022.

## 2022-12-16 DIAGNOSIS — T8131XD Disruption of external operation (surgical) wound, not elsewhere classified, subsequent encounter: Secondary | ICD-10-CM | POA: Diagnosis not present

## 2022-12-16 DIAGNOSIS — K56609 Unspecified intestinal obstruction, unspecified as to partial versus complete obstruction: Secondary | ICD-10-CM | POA: Diagnosis not present

## 2022-12-16 DIAGNOSIS — Z933 Colostomy status: Secondary | ICD-10-CM | POA: Diagnosis not present

## 2022-12-16 DIAGNOSIS — S31109A Unspecified open wound of abdominal wall, unspecified quadrant without penetration into peritoneal cavity, initial encounter: Secondary | ICD-10-CM | POA: Diagnosis not present

## 2022-12-17 DIAGNOSIS — S31109A Unspecified open wound of abdominal wall, unspecified quadrant without penetration into peritoneal cavity, initial encounter: Secondary | ICD-10-CM | POA: Diagnosis not present

## 2022-12-17 DIAGNOSIS — K56609 Unspecified intestinal obstruction, unspecified as to partial versus complete obstruction: Secondary | ICD-10-CM | POA: Diagnosis not present

## 2022-12-17 DIAGNOSIS — Z933 Colostomy status: Secondary | ICD-10-CM | POA: Diagnosis not present

## 2022-12-17 DIAGNOSIS — T8131XD Disruption of external operation (surgical) wound, not elsewhere classified, subsequent encounter: Secondary | ICD-10-CM | POA: Diagnosis not present

## 2022-12-22 ENCOUNTER — Encounter: Payer: Self-pay | Admitting: Hematology

## 2022-12-22 ENCOUNTER — Other Ambulatory Visit: Payer: Self-pay | Admitting: Physician Assistant

## 2022-12-22 DIAGNOSIS — C189 Malignant neoplasm of colon, unspecified: Secondary | ICD-10-CM

## 2022-12-22 DIAGNOSIS — Z7189 Other specified counseling: Secondary | ICD-10-CM

## 2022-12-23 ENCOUNTER — Other Ambulatory Visit: Payer: Self-pay | Admitting: Hematology and Oncology

## 2022-12-23 DIAGNOSIS — C787 Secondary malignant neoplasm of liver and intrahepatic bile duct: Secondary | ICD-10-CM

## 2022-12-23 DIAGNOSIS — Z7189 Other specified counseling: Secondary | ICD-10-CM

## 2022-12-25 ENCOUNTER — Other Ambulatory Visit: Payer: Self-pay

## 2022-12-25 ENCOUNTER — Other Ambulatory Visit: Payer: Self-pay | Admitting: Internal Medicine

## 2022-12-25 DIAGNOSIS — Z0000 Encounter for general adult medical examination without abnormal findings: Secondary | ICD-10-CM

## 2022-12-25 DIAGNOSIS — I10 Essential (primary) hypertension: Secondary | ICD-10-CM

## 2022-12-26 ENCOUNTER — Other Ambulatory Visit: Payer: Self-pay

## 2022-12-28 NOTE — Assessment & Plan Note (Signed)
[id number] stage IV with liver and nodal metastasis, MSS, KRAS G12S(+)  -Diagnosed in 01/2019 after emergent colectomy and liver biopsy. 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  -Started first line chemo on 03/12/2019, received 5FU/leuc with first 2 cycles for large open abdominal wound after surgery. She started full dose FOLFOX and avastin with cycle 2. Due to neuropathy Oxaliplatin was stopped after 09/24/19.  --For convenience she was switched to oral chemo Xeloda 2 weeks on/1 week off and Bev every 3 weeks in 01/21/20. She is tolerating well -Her restaging CT scan from 11/03/2022 showed stable liver metastasis, no other new lesions. I personally reviewed with pt  -She is clinically doing well, will continue current therapy. -Since she is tolerating treatment very well, I will see her every other treatment (6 week)

## 2022-12-29 ENCOUNTER — Inpatient Hospital Stay: Payer: Medicare HMO | Attending: Hematology

## 2022-12-29 ENCOUNTER — Inpatient Hospital Stay: Payer: Medicare HMO

## 2022-12-29 ENCOUNTER — Inpatient Hospital Stay (HOSPITAL_BASED_OUTPATIENT_CLINIC_OR_DEPARTMENT_OTHER): Payer: Medicare HMO | Admitting: Hematology

## 2022-12-29 VITALS — BP 141/60 | HR 54 | Temp 98.0°F | Resp 18 | Ht 67.0 in | Wt 250.3 lb

## 2022-12-29 DIAGNOSIS — C184 Malignant neoplasm of transverse colon: Secondary | ICD-10-CM | POA: Diagnosis not present

## 2022-12-29 DIAGNOSIS — C189 Malignant neoplasm of colon, unspecified: Secondary | ICD-10-CM

## 2022-12-29 DIAGNOSIS — C787 Secondary malignant neoplasm of liver and intrahepatic bile duct: Secondary | ICD-10-CM

## 2022-12-29 DIAGNOSIS — Z7189 Other specified counseling: Secondary | ICD-10-CM

## 2022-12-29 DIAGNOSIS — Z5112 Encounter for antineoplastic immunotherapy: Secondary | ICD-10-CM | POA: Insufficient documentation

## 2022-12-29 DIAGNOSIS — Z79899 Other long term (current) drug therapy: Secondary | ICD-10-CM | POA: Insufficient documentation

## 2022-12-29 DIAGNOSIS — Z95828 Presence of other vascular implants and grafts: Secondary | ICD-10-CM

## 2022-12-29 LAB — COMPREHENSIVE METABOLIC PANEL
ALT: 11 U/L (ref 0–44)
AST: 19 U/L (ref 15–41)
Albumin: 3.8 g/dL (ref 3.5–5.0)
Alkaline Phosphatase: 83 U/L (ref 38–126)
Anion gap: 3 — ABNORMAL LOW (ref 5–15)
BUN: 24 mg/dL — ABNORMAL HIGH (ref 8–23)
CO2: 21 mmol/L — ABNORMAL LOW (ref 22–32)
Calcium: 9.6 mg/dL (ref 8.9–10.3)
Chloride: 113 mmol/L — ABNORMAL HIGH (ref 98–111)
Creatinine, Ser: 1.16 mg/dL — ABNORMAL HIGH (ref 0.44–1.00)
GFR, Estimated: 53 mL/min — ABNORMAL LOW (ref >60)
Glucose, Bld: 83 mg/dL (ref 70–99)
Potassium: 4.9 mmol/L (ref 3.5–5.1)
Sodium: 137 mmol/L (ref 135–145)
Total Bilirubin: 0.3 mg/dL (ref 0.3–1.2)
Total Protein: 7.7 g/dL (ref 6.5–8.1)

## 2022-12-29 LAB — CBC WITH DIFFERENTIAL (CANCER CENTER ONLY)
Abs Immature Granulocytes: 0.02 10*3/uL (ref 0.00–0.07)
Basophils Absolute: 0 10*3/uL (ref 0.0–0.1)
Basophils Relative: 0 %
Eosinophils Absolute: 0.3 10*3/uL (ref 0.0–0.5)
Eosinophils Relative: 5 %
HCT: 34.8 % — ABNORMAL LOW (ref 36.0–46.0)
Hemoglobin: 11.2 g/dL — ABNORMAL LOW (ref 12.0–15.0)
Immature Granulocytes: 0 %
Lymphocytes Relative: 31 %
Lymphs Abs: 2 10*3/uL (ref 0.7–4.0)
MCH: 32.9 pg (ref 26.0–34.0)
MCHC: 32.2 g/dL (ref 30.0–36.0)
MCV: 102.4 fL — ABNORMAL HIGH (ref 80.0–100.0)
Monocytes Absolute: 0.6 10*3/uL (ref 0.1–1.0)
Monocytes Relative: 9 %
Neutro Abs: 3.5 10*3/uL (ref 1.7–7.7)
Neutrophils Relative %: 55 %
Platelet Count: 159 10*3/uL (ref 150–400)
RBC: 3.4 MIL/uL — ABNORMAL LOW (ref 3.87–5.11)
RDW: 16.4 % — ABNORMAL HIGH (ref 11.5–15.5)
WBC Count: 6.4 10*3/uL (ref 4.0–10.5)
nRBC: 0 % (ref 0.0–0.2)

## 2022-12-29 LAB — TOTAL PROTEIN, URINE DIPSTICK: Protein, ur: NEGATIVE mg/dL

## 2022-12-29 MED ORDER — SODIUM CHLORIDE 0.9 % IV SOLN: Freq: Once | INTRAVENOUS | Status: AC

## 2022-12-29 MED ORDER — SODIUM CHLORIDE 0.9 % IV SOLN
7.5000 mg/kg | Freq: Once | INTRAVENOUS | Status: AC
  Administered 2022-12-29: 800 mg via INTRAVENOUS
  Filled 2022-12-29: qty 32

## 2022-12-29 MED ORDER — SODIUM CHLORIDE 0.9% FLUSH
10.0000 mL | INTRAVENOUS | Status: DC | PRN
  Administered 2022-12-29: 10 mL

## 2022-12-29 MED ORDER — CAPECITABINE 500 MG PO TABS: 2000.0000 mg | ORAL_TABLET | Freq: Two times a day (BID) | ORAL | 2 refills | Status: DC

## 2022-12-29 MED ORDER — HEPARIN SOD (PORK) LOCK FLUSH 100 UNIT/ML IV SOLN
500.0000 [IU] | Freq: Once | INTRAVENOUS | Status: AC | PRN
  Administered 2022-12-29: 500 [IU]

## 2022-12-29 NOTE — Patient Instructions (Signed)
[place] [place] [facility]  Discharge Instructions: Thank you for choosing [facility] to provide your oncology and hematology care.   If you have a lab appointment with the Cancer Center, please go directly to the Cancer Center and check in at the registration area.   Wear comfortable clothing and clothing appropriate for easy access to any Portacath or PICC line.   We strive to give you quality time with your provider. You may need to reschedule your appointment if you arrive late (15 or more minutes).  Arriving late affects you and other patients whose appointments are after yours.  Also, if you miss three or more appointments without notifying the office, you may be dismissed from the clinic at the provider's discretion.      For prescription refill requests, have your pharmacy contact our office and allow 72 hours for refills to be completed.    Today you received the following chemotherapy and/or immunotherapy agents: Bevacizumab      To help prevent nausea and vomiting after your treatment, we encourage you to take your nausea medication as directed.  BELOW ARE SYMPTOMS THAT SHOULD BE REPORTED IMMEDIATELY: *FEVER GREATER THAN 100.4 F (38 C) OR HIGHER *CHILLS OR SWEATING *NAUSEA AND VOMITING THAT IS NOT CONTROLLED WITH YOUR NAUSEA MEDICATION *UNUSUAL SHORTNESS OF BREATH *UNUSUAL BRUISING OR BLEEDING *URINARY PROBLEMS (pain or burning when urinating, or frequent urination) *BOWEL PROBLEMS (unusual diarrhea, constipation, pain near the anus) TENDERNESS IN MOUTH AND THROAT WITH OR WITHOUT PRESENCE OF ULCERS (sore throat, sores in mouth, or a toothache) UNUSUAL RASH, SWELLING OR PAIN  UNUSUAL VAGINAL DISCHARGE OR ITCHING   Items with * indicate a potential emergency and should be followed up as soon as possible or go to the Emergency Department if any problems should occur.  Please show the CHEMOTHERAPY ALERT CARD or IMMUNOTHERAPY ALERT CARD at  check-in to the Emergency Department and triage nurse.  Should you have questions after your visit or need to cancel or reschedule your appointment, please contact [place] [facility]  Dept: [phone]  and follow the prompts.  Office hours are 8:00 a.m. to 4:30 p.m. Monday - Friday. Please note that voicemails left after 4:00 p.m. may not be returned until the following business day.  We are closed weekends and major holidays. You have access to a nurse at all times for urgent questions. Please call the main number to the clinic Dept: [phone] and follow the prompts.   For any non-urgent questions, you may also contact your provider using MyChart. We now offer e-Visits for anyone 43 and older to request care online for non-urgent symptoms. For details visit [email or web address].   Also download the MyChart app! Go to the app store, search "MyChart", open the app, select [place], and log in with your MyChart username and password.

## 2022-12-29 NOTE — Progress Notes (Signed)
[facility]   [phone] [phone]   Clinic Follow up Note   Patient Care Team: Philip Aspen, Limmie Patricia, MD as PCP - General (Internal Medicine) Rollene Rotunda, MD as PCP - Cardiology (Cardiology) Berna Bue, MD as Consulting Physician (General Surgery) Malachy Mood, MD as Consulting Physician (Hematology) Pollyann Samples, NP as Nurse Practitioner (Nurse Practitioner)  Date of Service:  12/29/2022  CHIEF COMPLAINT: f/u of metastatic colon cancer  CURRENT THERAPY:  Capecitabine and bevacizumab  Oncology History   Adenocarcinoma of colon metastatic to liver [facility]) [id number] stage IV with liver and nodal metastasis, MSS, KRAS G12S(+)  -Diagnosed in 01/2019 after emergent colectomy and liver biopsy. 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  -Started first line chemo on 03/12/2019, received 5FU/leuc with first 2 cycles for large open abdominal wound after surgery. She started full dose FOLFOX and avastin with cycle 2. Due to neuropathy Oxaliplatin was stopped after 09/24/19.  --For convenience she was switched to oral chemo Xeloda 2 weeks on/1 week off and Bev every 3 weeks in 01/21/20. She is tolerating well -Her restaging CT scan from 11/03/2022 showed stable liver metastasis, no other new lesions. I personally reviewed with pt  -She is clinically doing well, will continue current therapy. -Since she is tolerating treatment very well, I will see her every other treatment (6 week)   Assessment and Plan    Metastatic Colon Cancer Stable on Xeloda and Bevacizumab for 3 years. Last scan in July showed no significant change. -Continue Xeloda and Bevacizumab. -Next infusion scheduled for October 16th and November 6th. -Plan for next scan in November.  COVID-19 Recovered from recent infection. Reports chest pain likely secondary to coughing. -Continue prescribed treatment (Paxlovid and cough  medication).  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 -Continue Gabapentin.  Hypertension No changes reported. -Continue current antihypertensive medication.  Plan -Will proceed bevacizumab infusion today and continue every 3 weeks -Refill capecitabine prescription. -Next appointment scheduled for three weeks from now.         SUMMARY OF ONCOLOGIC HISTORY: Oncology History Overview Note  Cancer Staging Adenocarcinoma of colon metastatic to liver [facility]) Staging form: Colon and Rectum, AJCC 8th Edition - Pathologic stage from 01/24/2019: Stage IVA (pT4a, pN1a, pM1a) - Signed by Pollyann Samples, NP on 02/14/2019    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 Bowel-gas pattern consistent with small bowel obstruction. No free air. 2. No acute chest findings.   01/23/2019 Imaging   CT AP IMPRESSION: Obstructing mid transverse colonic mass with mild regional adenopathy and hepatic metastatic disease. The mass likely extends through the serosa; no ascites or peritoneal nodularity.   01/24/2019 Surgery   Surgeon: Berna Bue MD Assistant: Mattie Marlin PA-C Procedure performed: Transverse colectomy with end colostomy, liver biopsy Procedure classification: URGENT/EMERGENT Preop diagnosis: Obstructing, metastatic transverse colon mass Post-op diagnosis/intraop findings: Same   01/24/2019 Pathology Results   FINAL MICROSCOPIC DIAGNOSIS:   A. COLON, TRANSVERSE, RESECTION:  Colonic adenocarcinoma, 5 cm.  Carcinoma extends into pericolonic connective tissue and focally to  serosal surface.  Margins not involved.  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   B. LIVER NODULE, LEFT, BIOPSY:  Metastatic adenocarcinoma.    01/24/2019 Cancer Staging   Staging form: Colon and Rectum, AJCC 8th Edition - Pathologic stage from 01/24/2019: Stage IVA (pT4a, pN1a, pM1a) - Signed  by Pollyann Samples, NP on 02/14/2019  02/02/2019 Initial Diagnosis   Adenocarcinoma of colon metastatic to liver (HCC)   02/26/2019 PET scan   IMPRESSION: 1. Hypermetabolic metastatic disease in the liver and mediastinal/hilar/axillary lymph nodes. 2. Focal hypermetabolism in the rectum. Continued attention on follow-up exams is warranted. 3. Focal hypermetabolism medial to the right adrenal gland may be within a metastatic lymph node, better visualized on 01/23/2019. 4. Aortic atherosclerosis (ICD10-170.0). Coronary artery calcification.   03/12/2019 -  Chemotherapy   She started 5FU q2weeks on 03/12/19 for 2 cycles. She started full dose FOLFOX with Avastin on 04/09/19. Oxaliplatin dose reduced repeatedly due to neuropathy C12 and held since C16 on 10/09/19. Now on maintenance Avastin and 5FU q2weeks since 10/09/19       -Maintenance change to maintenance xeloda 2000 mg BID days 1-14 q21 days and q3 weeks Zirabev (15 mg/kg) starting 01/21/20. First cycle was taken 1000mg  BID due to misunderstanding.    05/31/2019 Imaging   Restaging CT CAP IMPRESSION: 1. Similar to mild interval decrease in size of multiple hepatic lesions, partially calcified. 2. 2 mm right upper lobe pulmonary nodule. Recommend attention on follow-up. 3. Emphysema and aortic atherosclerosis.   08/23/2019 Imaging   CT CAP w contrast  IMPRESSION: 1. The dominant peripheral right liver metastasis has mildly increased. Other smaller liver metastases are stable. 2. Otherwise no new or progressive metastatic disease in the chest, abdomen or pelvis. 3. Aortic Atherosclerosis (ICD10-I70.0) and Emphysema (ICD10-J43.9).   11/27/2019 Imaging   CT CAP w contrast  IMPRESSION: Status post transverse colectomy with right mid abdominal colostomy.   Mildly progressive hepatic metastases, as above.   No evidence of metastatic disease in the chest. Small mediastinal lymph nodes are within normal limits.   Additional stable  ancillary findings as above.   01/21/2020 -  Chemotherapy   Patient is on Treatment Plan : COLORECTAL Bevacizumab q21d     02/21/2020 Imaging   IMPRESSION: 1. Stable hepatic metastatic disease. 2. Aortic atherosclerosis (ICD10-I70.0). Coronary artery calcification. 3.  Emphysema (ICD10-J43.9).   05/19/2020 Imaging   CT CAP  IMPRESSION: 1. Multiple partially calcified liver metastases are again noted. With the exception of a small lesion in segment 7/8 lesions are not significantly changed in the interval. No new liver lesions identified. 2. Coronary artery atherosclerotic calcifications. 3. Aortic atherosclerosis. 4. 2 mm right upper lobe lung nodule identified.  Unchanged.   Aortic Atherosclerosis (ICD10-I70.0).   11/17/2020 Imaging   CT CAP  IMPRESSION: 1. Partially calcified lesions throughout the liver are stable accounting for differences in technique, contrasted imaging on today's study is compared to noncontrast imaging on the prior. 2. No new hepatic lesions. 3. Tiny 3 mm pulmonary nodule in the RIGHT upper lobe unchanged since the prior study. Attention on follow-up. 4. Mild fullness of RIGHT paratracheal nodal tissue is minimally increased and borderline enlarged, attention on follow-up. 5. RIGHT lower quadrant colostomy. 6. 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 7. Aortic atherosclerosis.   06/09/2021 Imaging   EXAM: CT CHEST, ABDOMEN, AND PELVIS WITH CONTRAST  IMPRESSION: Stable calcified liver metastases.   Stable mildly enlarged right paratracheal lymph node.   No evidence of new or progressive metastatic disease.   Aortic Atherosclerosis (ICD10-I70.0).   09/14/2021 PET scan   IMPRESSION: Multiple hypermetabolic and partially calcified liver metastases, without significant change compared to most recent CT.   Mild hypermetabolic  mediastinal and bilateral hilar lymphadenopathy, also  without significant change.   No evidence of new or progressive metastatic disease.     Electronically Signed   By: Danae Orleans M.D.   On: 09/15/2021 11:04   04/15/2022 Imaging    IMPRESSION: 1. Several previously noted partially calcified hepatic metastases appear grossly stable compared to the prior examination. No new hepatic lesions or other signs of metastatic disease noted elsewhere in the chest, abdomen or pelvis. 2. Aortic atherosclerosis, in addition to left main and three-vessel coronary artery disease. Please note that although the presence of coronary artery calcium documents the presence of coronary artery disease, the severity of this disease and any potential stenosis cannot be assessed on this non-gated CT examination. Assessment for potential risk factor modification, dietary therapy or pharmacologic therapy may be warranted, if clinically indicated. 3. There are calcifications of the mitral annulus. Echocardiographic correlation for evaluation of potential valvular dysfunction may be warranted if clinically indicated. 4. Additional incidental findings, similar to prior studies, as above.   07/15/2022 Imaging    IMPRESSION: 1. Stable exam. No new or progressive findings in the abdomen or pelvis to suggest worsening disease. 2. No substantial change in calcified liver metastases. 3. Right abdominal transverse end colostomy with parastomal herniation of fat and colon proximal to the stoma. 4. Probable left-sided uterine fibroid measuring on the order of 3.1 cm and displacing the endometrial stripe to the right. Pelvic ultrasound could be used to further evaluate as clinically warranted. 5.  Aortic Atherosclerosis (ICD10-I70.0).        Discussed the use of AI scribe software for clinical note transcription with the patient, who gave verbal consent to proceed.  History of Present Illness   The  patient, a 62 year old female with a history of metastatic colon cancer, presents for a follow-up visit. She has been on chemotherapy with Xeloda and bevacizumab since June 2021. Recently, she had a COVID-19 infection, which led to a temporary hold on her chemotherapy. She reports weight loss due to the COVID-19 infection and has been experiencing chest pain, described as a burning sensation. She has been coughing and was prescribed Paxlovid for the COVID-19 infection and a medication for the cough. She also reports muscle soreness in the chest area.  In addition to her cancer treatment, she has been managing chemotherapy-related neuropathy in her fingers and toes with gabapentin. She also takes medication for blood pressure. She denies any new medications or need for refills at this time. She denies any abdominal pain or swelling in her legs.         All other systems were reviewed with the patient and are negative.  MEDICAL HISTORY:  Past Medical History:  Diagnosis Date   Anemia    low iron   Colon cancer (HCC) 01/2019   Hypertension 01/23/2019   Personal history of chemotherapy 01/2019   colon CA   SBO (small bowel obstruction) (HCC) 01/23/2019    SURGICAL HISTORY: Past Surgical History:  Procedure Laterality Date   CESAREAN SECTION     x2   COLOSTOMY N/A 01/24/2019   Procedure: End Loop Colostomy;  Surgeon: Berna Bue, MD;  Location: [facility] OR;  Service: General;  Laterality: N/A;   PARTIAL COLECTOMY N/A 01/24/2019   Procedure: PARTIAL COLECTOMY;  Surgeon: Berna Bue, MD;  Location: [facility] OR;  Service: General;  Laterality: N/A;   PORTACATH PLACEMENT Right 02/28/2019   Procedure: INSERTION PORT-A-CATH WITH ULTRASOUND GUIDANCE;  Surgeon: Berna Bue, MD;  Location: [facility] OR;  Service: General;  Laterality:  Right;    I have reviewed the social history and family history with the patient and they are unchanged from previous note.  ALLERGIES:  has No Known  Allergies.  MEDICATIONS:  Current Outpatient Medications  Medication Sig Dispense Refill   acetaminophen (TYLENOL) 325 MG tablet Take 2 tablets (650 mg total) by mouth every 6 (six) hours as needed.     amLODipine (NORVASC) 10 MG tablet TAKE 1 TABLET BY MOUTH EVERY DAY 90 tablet 1   benzonatate (TESSALON) 100 MG capsule Take 1 capsule (100 mg total) by mouth 2 (two) times daily as needed for cough. 20 capsule 0   capecitabine (XELODA) 500 MG tablet Take 4 tablets (2,000 mg total) by mouth 2 (two) times daily after a meal. Take for 14 days, then off for 7 days. 112 tablet 2   ferrous sulfate 325 (65 FE) MG tablet TAKE 1 TABLET BY MOUTH TWICE A DAY WITH FOOD 180 tablet 1   gabapentin (NEURONTIN) 300 MG capsule Take 1 capsule (300 mg total) by mouth 2 (two) times daily. 60 capsule 3   hydrALAZINE (APRESOLINE) 25 MG tablet TAKE 1 TABLET BY MOUTH EVERY 8 HOURS 270 tablet 0   hydrochlorothiazide (HYDRODIURIL) 12.5 MG tablet Take 1 tablet (12.5 mg total) by mouth daily. 90 tablet 1   hydrocortisone (ANUSOL-HC) 2.5 % rectal cream Place 1 application rectally 2 (two) times daily. 30 g 0   Ibuprofen 200 MG CAPS Take 400 mg by mouth daily as needed (pain).     isosorbide mononitrate (IMDUR) 30 MG 24 hr tablet TAKE 1 TABLET BY MOUTH EVERY DAY 90 tablet 0   lidocaine (LMX) 4 % cream Apply 1 application topically 3 (three) times daily as needed. 30 g 0   lidocaine-prilocaine (EMLA) cream APPLY 1 APPLICATION TOPICALLY AS NEEDED. 30 g 1   lisinopril (ZESTRIL) 40 MG tablet Take 1 tablet (40 mg total) by mouth daily. 90 tablet 1   polycarbophil (FIBERCON) 625 MG tablet Take 1 tablet (625 mg total) by mouth daily. 30 tablet 0   No current facility-administered medications for this visit.   Facility-Administered Medications Ordered in Other Visits  Medication Dose Route Frequency Provider Last Rate Last Admin   sodium chloride flush (NS) 0.9 % injection 10 mL  10 mL Intracatheter PRN Malachy Mood, MD   10 mL at  06/06/20 1051   sodium chloride flush (NS) 0.9 % injection 10 mL  10 mL Intracatheter PRN Malachy Mood, MD   10 mL at 12/29/22 1011    PHYSICAL EXAMINATION: ECOG PERFORMANCE STATUS: 0 - Asymptomatic  Vitals:   12/29/22 0902  BP: (!) 141/60  Pulse: (!) 54  Resp: 18  Temp: 98 F (36.7 C)  SpO2: 100%   Wt Readings from Last 3 Encounters:  12/29/22 250 lb 4.8 oz (113.5 kg)  11/15/22 255 lb 14.4 oz (116.1 kg)  10/27/22 251 lb 11.2 oz (114.2 kg)     GENERAL:alert, no distress and comfortable SKIN: skin color, texture, turgor are normal, no rashes or significant lesions EYES: normal, Conjunctiva are pink and non-injected, sclera clear NECK: supple, thyroid normal size, non-tender, without nodularity LYMPH:  no palpable lymphadenopathy in the cervical, axillary  LUNGS: clear to auscultation and percussion with normal breathing effort HEART: regular rate & rhythm and no murmurs and no lower extremity edema ABDOMEN:abdomen soft, non-tender and normal bowel sounds Musculoskeletal:no cyanosis of digits and no clubbing  NEURO: alert & oriented x 3 with fluent speech, no focal motor/sensory deficits  Physical Exam   CHEST: lungs clear to auscultation      LABORATORY DATA:  I have reviewed the data as listed    Latest Ref Rng & Units 12/29/2022    8:42 AM 11/15/2022    9:15 AM 10/06/2022    8:00 AM  CBC  WBC 4.0 - 10.5 K/uL 6.4  7.3  6.4   Hemoglobin 12.0 - 15.0 g/dL 16.1  09.6  04.5   Hematocrit 36.0 - 46.0 % 34.8  34.7  35.5   Platelets 150 - 400 K/uL 159  184  180         Latest Ref Rng & Units 12/29/2022    8:42 AM 11/15/2022    9:15 AM 10/06/2022    8:00 AM  CMP  Glucose 70 - 99 mg/dL 83  409  71   BUN 8 - 23 mg/dL 24  26  32   Creatinine 0.44 - 1.00 mg/dL 8.11  9.14  7.82   Sodium 135 - 145 mmol/L 137  139  138   Potassium 3.5 - 5.1 mmol/L 4.9  4.1  4.6   Chloride 98 - 111 mmol/L 113  111  113   CO2 22 - 32 mmol/L 21  22  20    Calcium 8.9 - 10.3 mg/dL 9.6  9.4  9.6    Total Protein 6.5 - 8.1 g/dL 7.7  7.6  7.3   Total Bilirubin 0.3 - 1.2 mg/dL 0.3  0.5  0.4   Alkaline Phos 38 - 126 U/L 83  82  87   AST 15 - 41 U/L 19  19  17    ALT 0 - 44 U/L 11  11  10        RADIOGRAPHIC STUDIES: I have personally reviewed the radiological images as listed and agreed with the findings in the report. No results found.    No orders of the defined types were placed in this encounter.  All questions were answered. The patient knows to call the clinic with any problems, questions or concerns. No barriers to learning was detected. The total time spent in the appointment was 25 minutes.     Malachy Mood, MD 12/29/2022

## 2023-01-18 ENCOUNTER — Other Ambulatory Visit: Payer: Self-pay

## 2023-01-18 DIAGNOSIS — C189 Malignant neoplasm of colon, unspecified: Secondary | ICD-10-CM

## 2023-01-18 NOTE — Assessment & Plan Note (Signed)
[id number] stage IV with liver and nodal metastasis, MSS, KRAS G12S(+)  -Diagnosed in 01/2019 after emergent colectomy and liver biopsy. 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  -Started first line chemo on 03/12/2019, received 5FU/leuc with first 2 cycles for large open abdominal wound after surgery. She started full dose FOLFOX and avastin with cycle 2. Due to neuropathy Oxaliplatin was stopped after 09/24/19.  --For convenience she was switched to oral chemo Xeloda 2 weeks on/1 week off and Bev every 3 weeks in 01/21/20. She is tolerating well -Her restaging CT scan from 11/03/2022 showed stable liver metastasis, no other new lesions. I personally reviewed with pt  -She is clinically doing well, will continue current therapy. -Since she is tolerating treatment very well, I will see her every other treatment (6 week)

## 2023-01-19 ENCOUNTER — Inpatient Hospital Stay: Payer: Medicare HMO | Attending: Hematology

## 2023-01-19 ENCOUNTER — Ambulatory Visit: Payer: Medicare HMO

## 2023-01-19 ENCOUNTER — Inpatient Hospital Stay (HOSPITAL_BASED_OUTPATIENT_CLINIC_OR_DEPARTMENT_OTHER): Payer: Medicare HMO | Admitting: Hematology

## 2023-01-19 ENCOUNTER — Inpatient Hospital Stay: Payer: Medicare HMO

## 2023-01-19 VITALS — BP 154/64 | HR 60 | Temp 97.4°F | Resp 18 | Ht 67.0 in | Wt 252.8 lb

## 2023-01-19 VITALS — BP 135/61 | HR 61 | Temp 97.5°F

## 2023-01-19 DIAGNOSIS — C184 Malignant neoplasm of transverse colon: Secondary | ICD-10-CM | POA: Insufficient documentation

## 2023-01-19 DIAGNOSIS — Z7189 Other specified counseling: Secondary | ICD-10-CM

## 2023-01-19 DIAGNOSIS — Z79899 Other long term (current) drug therapy: Secondary | ICD-10-CM | POA: Diagnosis not present

## 2023-01-19 DIAGNOSIS — C189 Malignant neoplasm of colon, unspecified: Secondary | ICD-10-CM | POA: Diagnosis not present

## 2023-01-19 DIAGNOSIS — C787 Secondary malignant neoplasm of liver and intrahepatic bile duct: Secondary | ICD-10-CM | POA: Diagnosis not present

## 2023-01-19 DIAGNOSIS — Z95828 Presence of other vascular implants and grafts: Secondary | ICD-10-CM

## 2023-01-19 DIAGNOSIS — Z5112 Encounter for antineoplastic immunotherapy: Secondary | ICD-10-CM | POA: Diagnosis not present

## 2023-01-19 LAB — CMP (CANCER CENTER ONLY)
ALT: 15 U/L (ref 0–44)
AST: 19 U/L (ref 15–41)
Albumin: 3.8 g/dL (ref 3.5–5.0)
Alkaline Phosphatase: 85 U/L (ref 38–126)
Anion gap: 7 (ref 5–15)
BUN: 25 mg/dL — ABNORMAL HIGH (ref 8–23)
CO2: 21 mmol/L — ABNORMAL LOW (ref 22–32)
Calcium: 9.5 mg/dL (ref 8.9–10.3)
Chloride: 108 mmol/L (ref 98–111)
Creatinine: 1.13 mg/dL — ABNORMAL HIGH (ref 0.44–1.00)
GFR, Estimated: 55 mL/min — ABNORMAL LOW (ref >60)
Glucose, Bld: 90 mg/dL (ref 70–99)
Potassium: 4.7 mmol/L (ref 3.5–5.1)
Sodium: 136 mmol/L (ref 135–145)
Total Bilirubin: 0.7 mg/dL (ref 0.3–1.2)
Total Protein: 7.9 g/dL (ref 6.5–8.1)

## 2023-01-19 LAB — CBC WITH DIFFERENTIAL (CANCER CENTER ONLY)
Abs Immature Granulocytes: 0.02 10*3/uL (ref 0.00–0.07)
Basophils Absolute: 0 10*3/uL (ref 0.0–0.1)
Basophils Relative: 1 %
Eosinophils Absolute: 0.2 10*3/uL (ref 0.0–0.5)
Eosinophils Relative: 3 %
HCT: 35.3 % — ABNORMAL LOW (ref 36.0–46.0)
Hemoglobin: 11.3 g/dL — ABNORMAL LOW (ref 12.0–15.0)
Immature Granulocytes: 0 %
Lymphocytes Relative: 26 %
Lymphs Abs: 2.2 10*3/uL (ref 0.7–4.0)
MCH: 32.6 pg (ref 26.0–34.0)
MCHC: 32 g/dL (ref 30.0–36.0)
MCV: 101.7 fL — ABNORMAL HIGH (ref 80.0–100.0)
Monocytes Absolute: 0.6 10*3/uL (ref 0.1–1.0)
Monocytes Relative: 7 %
Neutro Abs: 5.2 10*3/uL (ref 1.7–7.7)
Neutrophils Relative %: 63 %
Platelet Count: 175 10*3/uL (ref 150–400)
RBC: 3.47 MIL/uL — ABNORMAL LOW (ref 3.87–5.11)
RDW: 16.8 % — ABNORMAL HIGH (ref 11.5–15.5)
WBC Count: 8.2 10*3/uL (ref 4.0–10.5)
nRBC: 0 % (ref 0.0–0.2)

## 2023-01-19 LAB — CEA (ACCESS): CEA (CHCC): 137.68 ng/mL — ABNORMAL HIGH (ref 0.00–5.00)

## 2023-01-19 LAB — TOTAL PROTEIN, URINE DIPSTICK: Protein, ur: NEGATIVE mg/dL

## 2023-01-19 MED ORDER — SODIUM CHLORIDE 0.9 % IV SOLN
7.5000 mg/kg | Freq: Once | INTRAVENOUS | Status: AC
  Administered 2023-01-19: 800 mg via INTRAVENOUS
  Filled 2023-01-19: qty 32

## 2023-01-19 MED ORDER — SODIUM CHLORIDE 0.9% FLUSH
10.0000 mL | Freq: Once | INTRAVENOUS | Status: AC
  Administered 2023-01-19: 10 mL

## 2023-01-19 MED ORDER — HEPARIN SOD (PORK) LOCK FLUSH 100 UNIT/ML IV SOLN
500.0000 [IU] | Freq: Once | INTRAVENOUS | Status: AC | PRN
  Administered 2023-01-19: 500 [IU]

## 2023-01-19 MED ORDER — SODIUM CHLORIDE 0.9 % IV SOLN: Freq: Once | INTRAVENOUS | Status: AC

## 2023-01-19 MED ORDER — GABAPENTIN 300 MG PO CAPS: 300.0000 mg | ORAL_CAPSULE | Freq: Two times a day (BID) | ORAL | 3 refills | Status: DC

## 2023-01-19 NOTE — Patient Instructions (Signed)
[place] [facility]  Discharge Instructions: Thank you for choosing [facility] to provide your oncology and hematology care.   If you have a lab appointment with the Cancer Center, please go directly to the Cancer Center and check in at the registration area.   Wear comfortable clothing and clothing appropriate for easy access to any Portacath or PICC line.   We strive to give you quality time with your provider. You may need to reschedule your appointment if you arrive late (15 or more minutes).  Arriving late affects you and other patients whose appointments are after yours.  Also, if you miss three or more appointments without notifying the office, you may be dismissed from the clinic at the provider's discretion.      For prescription refill requests, have your pharmacy contact our office and allow 72 hours for refills to be completed.    Today you received the following chemotherapy and/or immunotherapy agents: Bevacizumab      To help prevent nausea and vomiting after your treatment, we encourage you to take your nausea medication as directed.  BELOW ARE SYMPTOMS THAT SHOULD BE REPORTED IMMEDIATELY: *FEVER GREATER THAN 100.4 F (38 C) OR HIGHER *CHILLS OR SWEATING *NAUSEA AND VOMITING THAT IS NOT CONTROLLED WITH YOUR NAUSEA MEDICATION *UNUSUAL SHORTNESS OF BREATH *UNUSUAL BRUISING OR BLEEDING *URINARY PROBLEMS (pain or burning when urinating, or frequent urination) *BOWEL PROBLEMS (unusual diarrhea, constipation, pain near the anus) TENDERNESS IN MOUTH AND THROAT WITH OR WITHOUT PRESENCE OF ULCERS (sore throat, sores in mouth, or a toothache) UNUSUAL RASH, SWELLING OR PAIN  UNUSUAL VAGINAL DISCHARGE OR ITCHING   Items with * indicate a potential emergency and should be followed up as soon as possible or go to the Emergency Department if any problems should occur.  Please show the CHEMOTHERAPY ALERT CARD or IMMUNOTHERAPY ALERT CARD at  check-in to the Emergency Department and triage nurse.  Should you have questions after your visit or need to cancel or reschedule your appointment, please contact [place] [facility]  Dept: [phone]  and follow the prompts.  Office hours are 8:00 a.m. to 4:30 p.m. Monday - Friday. Please note that voicemails left after 4:00 p.m. may not be returned until the following business day.  We are closed weekends and major holidays. You have access to a nurse at all times for urgent questions. Please call the main number to the clinic Dept: [phone] and follow the prompts.   For any non-urgent questions, you may also contact your provider using MyChart. We now offer e-Visits for anyone 54 and older to request care online for non-urgent symptoms. For details visit [email or web address].   Also download the MyChart app! Go to the app store, search "MyChart", open the app, select , and log in with your MyChart username and password.

## 2023-01-19 NOTE — Progress Notes (Signed)
[facility]   [phone] [phone]   Clinic Follow up Note   Patient Care Team: Philip Aspen, Limmie Patricia, MD as PCP - General (Internal Medicine) Rollene Rotunda, MD as PCP - Cardiology (Cardiology) Berna Bue, MD as Consulting Physician (General Surgery) Malachy Mood, MD as Consulting Physician (Hematology) Pollyann Samples, NP as Nurse Practitioner (Nurse Practitioner)  Date of Service:  01/19/2023  CHIEF COMPLAINT: f/u of metastatic colon cancer  CURRENT THERAPY:  Xeloda 2000 mg twice daily for 2 weeks on and 1 week of Bevacizumab every 3 weeks  Oncology History   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  -Started first line chemo on 03/12/2019, received 5FU/leuc with first 2 cycles for large open abdominal wound after surgery. She started full dose FOLFOX and avastin with cycle 2. Due to neuropathy Oxaliplatin was stopped after 09/24/19.  --For convenience she was switched to oral chemo Xeloda 2 weeks on/1 week off and Bev every 3 weeks in 01/21/20. She is tolerating well -Her restaging CT scan from 11/03/2022 showed stable liver metastasis, no other new lesions. I personally reviewed with pt  -She is clinically doing well, will continue current therapy. -Since she is tolerating treatment very well, I will see her every other treatment (6 week)    Assessment and Plan    Metastatic Colon Cancer Stable disease on Capecitabine and bevacizumab with no new symptoms. Last scan in July showed stable disease. Mild hand dryness, no hand-foot syndrome. Mild neuropathy managed with Gabapentin. -Continue Capecitabine 4 pills BID. -Continue Gabapentin for neuropathy. -Order CT scan after Thanksgiving. -Next appointment scheduled for  the week after Thanksgiving, with chemo postponed for that week.  Hypertension Stable on current medication. -Continue current blood pressure medication.  Iron Deficiency On iron supplementation. -Continue current iron supplementation.  Plan -Lab reviewed, adequate for treatment, will proceed bevacizumab today, she will start Xeloda today. -Encouraged to maintain hydration to support kidney function. -Next infusion scheduled for November 6th, with the following infusion postponed by a week due to Thanksgiving.  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 Bowel-gas pattern consistent with small bowel obstruction. No free air. 2. No acute chest findings.   01/23/2019 Imaging   CT AP IMPRESSION: Obstructing mid transverse colonic mass with mild regional adenopathy and hepatic metastatic disease. The mass likely extends through the serosa; no ascites or peritoneal nodularity.   01/24/2019 Surgery   Surgeon: Berna Bue MD Assistant: Mattie Marlin PA-C Procedure performed: Transverse colectomy with end colostomy, liver biopsy Procedure classification: URGENT/EMERGENT Preop diagnosis: Obstructing, metastatic transverse colon mass Post-op diagnosis/intraop findings: Same   01/24/2019 Pathology Results   FINAL MICROSCOPIC DIAGNOSIS:   A. COLON, TRANSVERSE, RESECTION:  Colonic adenocarcinoma, 5 cm.  Carcinoma extends into pericolonic connective tissue and focally to  serosal surface.  Margins not involved.  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   B. LIVER NODULE,  LEFT,  BIOPSY:  Metastatic adenocarcinoma.    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 Hypermetabolic metastatic disease in the liver and mediastinal/hilar/axillary lymph nodes. 2. Focal hypermetabolism in the rectum. Continued attention on follow-up exams is warranted. 3. Focal hypermetabolism medial to the right adrenal gland may be within a metastatic lymph node, better visualized on 01/23/2019. 4. Aortic atherosclerosis (ICD10-170.0). Coronary artery calcification.   03/12/2019 -  Chemotherapy   She started 5FU q2weeks on 03/12/19 for 2 cycles. She started full dose FOLFOX with Avastin on 04/09/19. Oxaliplatin dose reduced repeatedly due to neuropathy C12 and held since C16 on 10/09/19. Now on maintenance Avastin and 5FU q2weeks since 10/09/19       -Maintenance change to maintenance xeloda 2000 mg BID days 1-14 q21 days and q3 weeks Zirabev (15 mg/kg) starting 01/21/20. First cycle was taken 1000mg  BID due to misunderstanding.    05/31/2019 Imaging   Restaging CT CAP IMPRESSION: 1. Similar to mild interval decrease in size of multiple hepatic lesions, partially calcified. 2. 2 mm right upper lobe pulmonary nodule. Recommend attention on follow-up. 3. Emphysema and aortic atherosclerosis.   08/23/2019 Imaging   CT CAP w contrast  IMPRESSION: 1. The dominant peripheral right liver metastasis has mildly increased. Other smaller liver metastases are stable. 2. Otherwise no new or progressive metastatic disease in the chest, abdomen or pelvis. 3. Aortic Atherosclerosis (ICD10-I70.0) and Emphysema (ICD10-J43.9).   11/27/2019 Imaging   CT CAP w contrast  IMPRESSION: Status post transverse colectomy with right mid abdominal  colostomy.   Mildly progressive hepatic metastases, as above.   No evidence of metastatic disease in the chest. Small mediastinal lymph nodes are within normal limits.   Additional stable ancillary findings as above.   01/21/2020 -  Chemotherapy   Patient is on Treatment Plan : COLORECTAL Bevacizumab q21d     02/21/2020 Imaging   IMPRESSION: 1. Stable hepatic metastatic disease. 2. Aortic atherosclerosis (ICD10-I70.0). Coronary artery calcification. 3.  Emphysema (ICD10-J43.9).   05/19/2020 Imaging   CT CAP  IMPRESSION: 1. Multiple partially calcified liver metastases are again noted. With the exception of a small lesion in segment 7/8 lesions are not significantly changed in the interval. No new liver lesions identified. 2. Coronary artery atherosclerotic calcifications. 3. Aortic atherosclerosis. 4. 2 mm right upper lobe lung nodule identified.  Unchanged.   Aortic Atherosclerosis (ICD10-I70.0).   11/17/2020 Imaging   CT CAP  IMPRESSION: 1. Partially calcified lesions throughout the liver are stable accounting for differences in technique, contrasted imaging on today's study is compared to noncontrast imaging on the prior. 2. No new hepatic lesions. 3. Tiny 3 mm pulmonary nodule in the RIGHT upper lobe unchanged since the prior study. Attention on follow-up. 4. Mild fullness of RIGHT paratracheal nodal tissue is minimally increased and borderline enlarged, attention on follow-up. 5. RIGHT lower quadrant colostomy. 6. 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 7. Aortic atherosclerosis.   06/09/2021 Imaging   EXAM: CT CHEST, ABDOMEN, AND PELVIS WITH CONTRAST  IMPRESSION: Stable calcified liver metastases.   Stable mildly enlarged right paratracheal lymph node.   No evidence of new  or progressive metastatic disease.   Aortic Atherosclerosis  (ICD10-I70.0).   09/14/2021 PET scan   IMPRESSION: Multiple hypermetabolic and partially calcified liver metastases, without significant change compared to most recent CT.   Mild hypermetabolic mediastinal and bilateral hilar lymphadenopathy, also without significant change.   No evidence of new or progressive metastatic disease.     Electronically Signed   By: Danae Orleans M.D.   On: 09/15/2021 11:04   04/15/2022 Imaging    IMPRESSION: 1. Several previously noted partially calcified hepatic metastases appear grossly stable compared to the prior examination. No new hepatic lesions or other signs of metastatic disease noted elsewhere in the chest, abdomen or pelvis. 2. Aortic atherosclerosis, in addition to left main and three-vessel coronary artery disease. Please note that although the presence of coronary artery calcium documents the presence of coronary artery disease, the severity of this disease and any potential stenosis cannot be assessed on this non-gated CT examination. Assessment for potential risk factor modification, dietary therapy or pharmacologic therapy may be warranted, if clinically indicated. 3. There are calcifications of the mitral annulus. Echocardiographic correlation for evaluation of potential valvular dysfunction may be warranted if clinically indicated. 4. Additional incidental findings, similar to prior studies, as above.   07/15/2022 Imaging    IMPRESSION: 1. Stable exam. No new or progressive findings in the abdomen or pelvis to suggest worsening disease. 2. No substantial change in calcified liver metastases. 3. Right abdominal transverse end colostomy with parastomal herniation of fat and colon proximal to the stoma. 4. Probable left-sided uterine fibroid measuring on the order of 3.1 cm and displacing the endometrial stripe to the right. Pelvic ultrasound could be used to further evaluate as clinically warranted. 5.  Aortic  Atherosclerosis (ICD10-I70.0).        Discussed the use of AI scribe software for clinical note transcription with the patient, who gave verbal consent to proceed.  History of Present Illness   The patient, a 62 year old female with metastatic colon cancer, presents for a routine follow-up. She reports no new symptoms and overall well-being. She has stable weight with a recent gain after a previous loss. She is eating well. She reports dry hands, which she attributes to recent washing and the winter season. She denies any cracking or peeling of the skin. She experiences slight numbness and tingling but denies any functional issues with her hands. Her stomach feels fine, and bowel movements are normal without constipation or diarrhea. She continues her chemotherapy regimen with capecitabine, taking four pills in the morning and four at night. She also takes blood pressure medication and gabapentin for neuropathy, which she uses daily. She is due for a refill of gabapentin soon. She also takes iron pills and amlacrim, which are automatically refilled by her pharmacy.         All other systems were reviewed with the patient and are negative.  MEDICAL HISTORY:  Past Medical History:  Diagnosis Date   Anemia    low iron   Colon cancer (HCC) 01/2019   Hypertension 01/23/2019   Personal history of chemotherapy 01/2019   colon CA   SBO (small bowel obstruction) (HCC) 01/23/2019    SURGICAL HISTORY: Past Surgical History:  Procedure Laterality Date   CESAREAN SECTION     x2   COLOSTOMY N/A 01/24/2019   Procedure: End Loop Colostomy;  Surgeon: Berna Bue, MD;  Location: [facility] OR;  Service: General;  Laterality: N/A;   PARTIAL COLECTOMY N/A 01/24/2019   Procedure: PARTIAL COLECTOMY;  Surgeon: Berna Bue, MD;  Location: [facility] OR;  Service: General;  Laterality: N/A;   PORTACATH PLACEMENT Right 02/28/2019   Procedure: INSERTION PORT-A-CATH WITH ULTRASOUND GUIDANCE;  Surgeon: Berna Bue, MD;  Location: [facility] OR;  Service: General;  Laterality: Right;    I have reviewed the social history and family history with the patient and they are unchanged from previous note.  ALLERGIES:  has No Known Allergies.  MEDICATIONS:  Current Outpatient Medications  Medication Sig Dispense Refill   acetaminophen (TYLENOL) 325 MG tablet Take 2 tablets (650 mg total) by mouth every 6 (six) hours as needed.     amLODipine (NORVASC) 10 MG tablet TAKE 1 TABLET BY MOUTH EVERY DAY 90 tablet 1   benzonatate (TESSALON) 100 MG capsule Take 1 capsule (100 mg total) by mouth 2 (two) times daily as needed for cough. 20 capsule 0   capecitabine (XELODA) 500 MG tablet Take 4 tablets (2,000 mg total) by mouth 2 (two) times daily after a meal. Take for 14 days, then off for 7 days. 112 tablet 2   ferrous sulfate 325 (65 FE) MG tablet TAKE 1 TABLET BY MOUTH TWICE A DAY WITH FOOD 180 tablet 1   gabapentin (NEURONTIN) 300 MG capsule Take 1 capsule (300 mg total) by mouth 2 (two) times daily. 60 capsule 3   hydrALAZINE (APRESOLINE) 25 MG tablet TAKE 1 TABLET BY MOUTH EVERY 8 HOURS 270 tablet 0   hydrochlorothiazide (HYDRODIURIL) 12.5 MG tablet Take 1 tablet (12.5 mg total) by mouth daily. 90 tablet 1   hydrocortisone (ANUSOL-HC) 2.5 % rectal cream Place 1 application rectally 2 (two) times daily. 30 g 0   Ibuprofen 200 MG CAPS Take 400 mg by mouth daily as needed (pain).     isosorbide mononitrate (IMDUR) 30 MG 24 hr tablet TAKE 1 TABLET BY MOUTH EVERY DAY 90 tablet 0   lidocaine (LMX) 4 % cream Apply 1 application topically 3 (three) times daily as needed. 30 g 0   lidocaine-prilocaine (EMLA) cream APPLY 1 APPLICATION TOPICALLY AS NEEDED. 30 g 1   lisinopril (ZESTRIL) 40 MG tablet Take 1 tablet (40 mg total) by mouth daily. 90 tablet 1   polycarbophil (FIBERCON) 625 MG tablet Take 1 tablet (625 mg total) by mouth daily. 30 tablet 0   No current facility-administered medications for this visit.    Facility-Administered Medications Ordered in Other Visits  Medication Dose Route Frequency Provider Last Rate Last Admin   bevacizumab-adcd (VEGZELMA) 800 mg in sodium chloride 0.9 % 100 mL chemo infusion  7.5 mg/kg (Treatment Plan Recorded) Intravenous Once Malachy Mood, MD       heparin lock flush 100 unit/mL  500 Units Intracatheter Once PRN Malachy Mood, MD       sodium chloride flush (NS) 0.9 % injection 10 mL  10 mL Intracatheter PRN Malachy Mood, MD   10 mL at 06/06/20 1051    PHYSICAL EXAMINATION: ECOG PERFORMANCE STATUS: 0 - Asymptomatic  Vitals:   01/19/23 0843  BP: (!) 154/64  Pulse: 60  Resp: 18  Temp: (!) 97.4 F (36.3 C)  SpO2: 100%   Wt Readings from Last 3 Encounters:  01/19/23 252 lb 12.8 oz (114.7 kg)  12/29/22 250 lb 4.8 oz (113.5 kg)  11/15/22 255 lb 14.4 oz (116.1 kg)     GENERAL:alert, no distress and comfortable SKIN: skin color, texture, turgor are normal, no rashes or significant lesions EYES: normal, Conjunctiva are pink and non-injected, sclera clear NECK: supple,  thyroid normal size, non-tender, without nodularity LYMPH:  no palpable lymphadenopathy in the cervical, axillary  LUNGS: clear to auscultation and percussion with normal breathing effort HEART: regular rate & rhythm and no murmurs and no lower extremity edema ABDOMEN:abdomen soft, non-tender and normal bowel sounds Musculoskeletal:no cyanosis of digits and no clubbing  NEURO: alert & oriented x 3 with fluent speech, no focal motor/sensory deficits   LABORATORY DATA:  I have reviewed the data as listed    Latest Ref Rng & Units 01/19/2023    8:25 AM 12/29/2022    8:42 AM 11/15/2022    9:15 AM  CBC  WBC 4.0 - 10.5 K/uL 8.2  6.4  7.3   Hemoglobin 12.0 - 15.0 g/dL 14.7  82.9  56.2   Hematocrit 36.0 - 46.0 % 35.3  34.8  34.7   Platelets 150 - 400 K/uL 175  159  184         Latest Ref Rng & Units 12/29/2022    8:42 AM 11/15/2022    9:15 AM 10/06/2022    8:00 AM  CMP  Glucose 70 - 99 mg/dL  83  130  71   BUN 8 - 23 mg/dL 24  26  32   Creatinine 0.44 - 1.00 mg/dL 8.65  7.84  6.96   Sodium 135 - 145 mmol/L 137  139  138   Potassium 3.5 - 5.1 mmol/L 4.9  4.1  4.6   Chloride 98 - 111 mmol/L 113  111  113   CO2 22 - 32 mmol/L 21  22  20    Calcium 8.9 - 10.3 mg/dL 9.6  9.4  9.6   Total Protein 6.5 - 8.1 g/dL 7.7  7.6  7.3   Total Bilirubin 0.3 - 1.2 mg/dL 0.3  0.5  0.4   Alkaline Phos 38 - 126 U/L 83  82  87   AST 15 - 41 U/L 19  19  17    ALT 0 - 44 U/L 11  11  10        RADIOGRAPHIC STUDIES: I have personally reviewed the radiological images as listed and agreed with the findings in the report. No results found.    Orders Placed This Encounter  Procedures   CT CHEST ABDOMEN PELVIS W CONTRAST    HOLD IV contrast if EGFR<50    Standing Status:   Future    Standing Expiration Date:   01/19/2024    Order Specific Question:   If indicated for the ordered procedure, I authorize the administration of contrast media per Radiology protocol    Answer:   Yes    Order Specific Question:   Does the patient have a contrast media/X-ray dye allergy?    Answer:   No    Order Specific Question:   Preferred imaging location?    Answer:   [facility]    Order Specific Question:   If indicated for the ordered procedure, I authorize the administration of oral contrast media per Radiology protocol    Answer:   Yes   All questions were answered. The patient knows to call the clinic with any problems, questions or concerns. No barriers to learning was detected. The total time spent in the appointment was 25 minutes.     Malachy Mood, MD 01/19/2023

## 2023-01-21 ENCOUNTER — Other Ambulatory Visit: Payer: Self-pay

## 2023-01-22 ENCOUNTER — Other Ambulatory Visit: Payer: Self-pay

## 2023-01-24 ENCOUNTER — Ambulatory Visit (INDEPENDENT_AMBULATORY_CARE_PROVIDER_SITE_OTHER): Payer: Medicare HMO | Admitting: Podiatry

## 2023-01-24 ENCOUNTER — Encounter: Payer: Self-pay | Admitting: Podiatry

## 2023-01-24 DIAGNOSIS — L84 Corns and callosities: Secondary | ICD-10-CM

## 2023-01-24 DIAGNOSIS — L309 Dermatitis, unspecified: Secondary | ICD-10-CM

## 2023-01-24 DIAGNOSIS — M79674 Pain in right toe(s): Secondary | ICD-10-CM

## 2023-01-24 DIAGNOSIS — M79675 Pain in left toe(s): Secondary | ICD-10-CM

## 2023-01-24 DIAGNOSIS — B351 Tinea unguium: Secondary | ICD-10-CM

## 2023-01-24 NOTE — Progress Notes (Signed)
This patient returns to my office for at risk foot care.  This patient requires this care by a professional since this patient will be at risk due to having neuropathy due to cancer treatment.  Patient is presently experiencing peeling in her bottom of both feet.  This patient is unable to cut nails herself since the patient cannot reach her nails.These nails are painful walking and wearing shoes.  This patient presents for at risk foot care today.  General Appearance  Alert, conversant and in no acute stress.  Vascular  Dorsalis pedis and posterior tibial  pulses are weakly  palpable  bilaterally.  Capillary return is within normal limits  bilaterally. Temperature is within normal limits  bilaterally.  Neurologic  Senn-Weinstein monofilament wire test diminished   bilaterally. Muscle power within normal limits bilaterally.  Nails Thick disfigured discolored nails with subungual debris  from hallux to fifth toes bilaterally. No evidence of bacterial infection or drainage bilaterally.  Orthopedic  No limitations of motion  feet .  No crepitus or effusions noted.  No bony pathology or digital deformities noted.  Skin  normotropic skin noted bilaterally.  No signs of infections or ulcers noted.  Asymptomatic porokeratosis sub 4th met left foot.  Desquamation noted plantar aspect feet  B/L.   Onychomycosis  Pain in right toes  Pain in left toes  Consent was obtained for treatment procedures.   Mechanical debridement of nails 1-5  bilaterally performed with a nail nipper.  Filed with dremel without incident.    Return office visit   4 months                   Told patient to return for periodic foot care and evaluation due to potential at risk complications.   Kellene Mccleary DPM  

## 2023-01-25 ENCOUNTER — Other Ambulatory Visit: Payer: Self-pay

## 2023-01-26 ENCOUNTER — Telehealth: Payer: Self-pay | Admitting: Internal Medicine

## 2023-01-26 NOTE — Telephone Encounter (Signed)
Reporting Pt has heart rate 47-48 asymptomatic

## 2023-02-01 ENCOUNTER — Ambulatory Visit (INDEPENDENT_AMBULATORY_CARE_PROVIDER_SITE_OTHER): Payer: Medicare HMO | Admitting: Internal Medicine

## 2023-02-01 ENCOUNTER — Encounter: Payer: Self-pay | Admitting: Internal Medicine

## 2023-02-01 VITALS — BP 110/64 | HR 53 | Temp 97.9°F | Ht 68.0 in | Wt 247.6 lb

## 2023-02-01 DIAGNOSIS — T451X5A Adverse effect of antineoplastic and immunosuppressive drugs, initial encounter: Secondary | ICD-10-CM | POA: Diagnosis not present

## 2023-02-01 DIAGNOSIS — I10 Essential (primary) hypertension: Secondary | ICD-10-CM

## 2023-02-01 DIAGNOSIS — K56609 Unspecified intestinal obstruction, unspecified as to partial versus complete obstruction: Secondary | ICD-10-CM | POA: Diagnosis not present

## 2023-02-01 DIAGNOSIS — C787 Secondary malignant neoplasm of liver and intrahepatic bile duct: Secondary | ICD-10-CM

## 2023-02-01 DIAGNOSIS — Z1159 Encounter for screening for other viral diseases: Secondary | ICD-10-CM | POA: Diagnosis not present

## 2023-02-01 DIAGNOSIS — Z933 Colostomy status: Secondary | ICD-10-CM | POA: Diagnosis not present

## 2023-02-01 DIAGNOSIS — T8131XD Disruption of external operation (surgical) wound, not elsewhere classified, subsequent encounter: Secondary | ICD-10-CM | POA: Diagnosis not present

## 2023-02-01 DIAGNOSIS — S31109A Unspecified open wound of abdominal wall, unspecified quadrant without penetration into peritoneal cavity, initial encounter: Secondary | ICD-10-CM | POA: Diagnosis not present

## 2023-02-01 DIAGNOSIS — C189 Malignant neoplasm of colon, unspecified: Secondary | ICD-10-CM | POA: Diagnosis not present

## 2023-02-01 DIAGNOSIS — E559 Vitamin D deficiency, unspecified: Secondary | ICD-10-CM | POA: Diagnosis not present

## 2023-02-01 DIAGNOSIS — Z0000 Encounter for general adult medical examination without abnormal findings: Secondary | ICD-10-CM | POA: Diagnosis not present

## 2023-02-01 DIAGNOSIS — G620 Drug-induced polyneuropathy: Secondary | ICD-10-CM

## 2023-02-01 DIAGNOSIS — Z23 Encounter for immunization: Secondary | ICD-10-CM | POA: Diagnosis not present

## 2023-02-01 LAB — CBC WITH DIFFERENTIAL/PLATELET
Basophils Absolute: 0 10*3/uL (ref 0.0–0.1)
Basophils Relative: 0.6 % (ref 0.0–3.0)
Eosinophils Absolute: 0.2 10*3/uL (ref 0.0–0.7)
Eosinophils Relative: 3.1 % (ref 0.0–5.0)
HCT: 39.3 % (ref 36.0–46.0)
Hemoglobin: 12.4 g/dL (ref 12.0–15.0)
Lymphocytes Relative: 30.5 % (ref 12.0–46.0)
Lymphs Abs: 2 10*3/uL (ref 0.7–4.0)
MCHC: 31.5 g/dL (ref 30.0–36.0)
MCV: 101.6 fL — ABNORMAL HIGH (ref 78.0–100.0)
Monocytes Absolute: 0.5 10*3/uL (ref 0.1–1.0)
Monocytes Relative: 8 % (ref 3.0–12.0)
Neutro Abs: 3.8 10*3/uL (ref 1.4–7.7)
Neutrophils Relative %: 57.8 % (ref 43.0–77.0)
Platelets: 179 10*3/uL (ref 150.0–400.0)
RBC: 3.87 Mil/uL (ref 3.87–5.11)
RDW: 17.4 % — ABNORMAL HIGH (ref 11.5–15.5)
WBC: 6.6 10*3/uL (ref 4.0–10.5)

## 2023-02-01 LAB — LIPID PANEL
Cholesterol: 157 mg/dL (ref 0–200)
HDL: 59.1 mg/dL (ref >39.00)
LDL Cholesterol: 84 mg/dL (ref 0–99)
NonHDL: 97.87
Total CHOL/HDL Ratio: 3
Triglycerides: 67 mg/dL (ref 0.0–149.0)
VLDL: 13.4 mg/dL (ref 0.0–40.0)

## 2023-02-01 LAB — COMPREHENSIVE METABOLIC PANEL
ALT: 11 U/L (ref 0–35)
AST: 20 U/L (ref 0–37)
Albumin: 4.2 g/dL (ref 3.5–5.2)
Alkaline Phosphatase: 85 U/L (ref 39–117)
BUN: 22 mg/dL (ref 6–23)
CO2: 21 meq/L (ref 19–32)
Calcium: 10.3 mg/dL (ref 8.4–10.5)
Chloride: 109 meq/L (ref 96–112)
Creatinine, Ser: 1.03 mg/dL (ref 0.40–1.20)
GFR: 58.13 mL/min — ABNORMAL LOW (ref >60.00)
Glucose, Bld: 73 mg/dL (ref 70–99)
Potassium: 4.3 meq/L (ref 3.5–5.1)
Sodium: 137 meq/L (ref 135–145)
Total Bilirubin: 0.5 mg/dL (ref 0.2–1.2)
Total Protein: 8.4 g/dL — ABNORMAL HIGH (ref 6.0–8.3)

## 2023-02-01 LAB — HEMOGLOBIN A1C: Hgb A1c MFr Bld: 5.5 % (ref 4.6–6.5)

## 2023-02-01 LAB — VITAMIN D 25 HYDROXY (VIT D DEFICIENCY, FRACTURES): VITD: 21.61 ng/mL — ABNORMAL LOW (ref 30.00–100.00)

## 2023-02-01 LAB — VITAMIN B12: Vitamin B-12: 320 pg/mL (ref 211–911)

## 2023-02-01 LAB — TSH: TSH: 5.98 u[IU]/mL — ABNORMAL HIGH (ref 0.35–5.50)

## 2023-02-01 NOTE — Addendum Note (Signed)
Addended by: Kern Reap B on: 02/01/2023 08:49 AM   Modules accepted: Orders

## 2023-02-01 NOTE — Progress Notes (Signed)
Established Patient Office Visit     CC/Reason for Visit: Annual preventive exam and initial Medicare wellness visit  HPI: Casey Black is a 62 y.o. female who is coming in today for the above mentioned reasons. Past Medical History is significant for: Hypertension and active colon cancer under treatment.  She has been doing well other than peripheral neuropathy.  Has routine eye and dental care, no perceived hearing difficulties.  She is overdue for flu and shingles vaccine.  She has been told not to get a shingles vaccine due to her active chemotherapy.  She had a mammogram in August.  Will be due for cervical cancer screening next year.  She no longer does colonoscopies as she has a history of colon cancer under active treatment with a colostomy.   Past Medical/Surgical History: Past Medical History:  Diagnosis Date   Anemia    low iron   Colon cancer (HCC) 01/2019   Hypertension 01/23/2019   Personal history of chemotherapy 01/2019   colon CA   SBO (small bowel obstruction) (HCC) 01/23/2019    Past Surgical History:  Procedure Laterality Date   CESAREAN SECTION     x2   COLOSTOMY N/A 01/24/2019   Procedure: End Loop Colostomy;  Surgeon: Berna Bue, MD;  Location: [facility] OR;  Service: General;  Laterality: N/A;   PARTIAL COLECTOMY N/A 01/24/2019   Procedure: PARTIAL COLECTOMY;  Surgeon: Berna Bue, MD;  Location: [facility] OR;  Service: General;  Laterality: N/A;   PORTACATH PLACEMENT Right 02/28/2019   Procedure: INSERTION PORT-A-CATH WITH ULTRASOUND GUIDANCE;  Surgeon: Berna Bue, MD;  Location: [facility] OR;  Service: General;  Laterality: Right;    Social History:  reports that she quit smoking about 4 years ago. Her smoking use included cigarettes. She started smoking about 33 years ago. She has a 14.5 pack-year smoking history. She has never used smokeless tobacco. She reports that she does not currently use alcohol. She reports that she does not use  drugs.  Allergies: No Known Allergies  Family History:  Family History  Problem Relation Age of Onset   Heart murmur Mother    Kidney failure Father    Hypertension Father    Hypertension Sister      Current Outpatient Medications:    acetaminophen (TYLENOL) 325 MG tablet, Take 2 tablets (650 mg total) by mouth every 6 (six) hours as needed., Disp: , Rfl:    amLODipine (NORVASC) 10 MG tablet, TAKE 1 TABLET BY MOUTH EVERY DAY, Disp: 90 tablet, Rfl: 1   benzonatate (TESSALON) 100 MG capsule, Take 1 capsule (100 mg total) by mouth 2 (two) times daily as needed for cough., Disp: 20 capsule, Rfl: 0   capecitabine (XELODA) 500 MG tablet, Take 4 tablets (2,000 mg total) by mouth 2 (two) times daily after a meal. Take for 14 days, then off for 7 days., Disp: 112 tablet, Rfl: 2   ferrous sulfate 325 (65 FE) MG tablet, TAKE 1 TABLET BY MOUTH TWICE A DAY WITH FOOD, Disp: 180 tablet, Rfl: 1   gabapentin (NEURONTIN) 300 MG capsule, Take 1 capsule (300 mg total) by mouth 2 (two) times daily., Disp: 60 capsule, Rfl: 3   hydrALAZINE (APRESOLINE) 25 MG tablet, TAKE 1 TABLET BY MOUTH EVERY 8 HOURS, Disp: 270 tablet, Rfl: 0   hydrochlorothiazide (HYDRODIURIL) 12.5 MG tablet, Take 1 tablet (12.5 mg total) by mouth daily., Disp: 90 tablet, Rfl: 1   hydrocortisone (ANUSOL-HC) 2.5 % rectal cream, Place  1 application rectally 2 (two) times daily., Disp: 30 g, Rfl: 0   Ibuprofen 200 MG CAPS, Take 400 mg by mouth daily as needed (pain)., Disp: , Rfl:    isosorbide mononitrate (IMDUR) 30 MG 24 hr tablet, TAKE 1 TABLET BY MOUTH EVERY DAY, Disp: 90 tablet, Rfl: 0   lidocaine (LMX) 4 % cream, Apply 1 application topically 3 (three) times daily as needed., Disp: 30 g, Rfl: 0   lidocaine-prilocaine (EMLA) cream, APPLY 1 APPLICATION TOPICALLY AS NEEDED., Disp: 30 g, Rfl: 1   lisinopril (ZESTRIL) 40 MG tablet, Take 1 tablet (40 mg total) by mouth daily., Disp: 90 tablet, Rfl: 1   polycarbophil (FIBERCON) 625 MG tablet,  Take 1 tablet (625 mg total) by mouth daily., Disp: 30 tablet, Rfl: 0 No current facility-administered medications for this visit.  Facility-Administered Medications Ordered in Other Visits:    sodium chloride flush (NS) 0.9 % injection 10 mL, 10 mL, Intracatheter, PRN, Malachy Mood, MD, 10 mL at 06/06/20 1051  Review of Systems:  Negative unless indicated in HPI.   Physical Exam: Vitals:   02/01/23 0816  BP: 110/64  Pulse: (!) 53  Temp: 97.9 F (36.6 C)  TempSrc: Oral  SpO2: 100%  Weight: 247 lb 9.6 oz (112.3 kg)  Height: 5\' 8"  (1.727 m)    Body mass index is 37.65 kg/m.   Physical Exam Vitals reviewed.  Constitutional:      General: She is not in acute distress.    Appearance: Normal appearance. She is not ill-appearing, toxic-appearing or diaphoretic.  HENT:     Head: Normocephalic.     Right Ear: Tympanic membrane, ear canal and external ear normal. There is no impacted cerumen.     Left Ear: Tympanic membrane, ear canal and external ear normal. There is no impacted cerumen.     Nose: Nose normal.     Mouth/Throat:     Mouth: Mucous membranes are moist.     Pharynx: Oropharynx is clear. No oropharyngeal exudate or posterior oropharyngeal erythema.  Eyes:     General: No scleral icterus.       Right eye: No discharge.        Left eye: No discharge.     Conjunctiva/sclera: Conjunctivae normal.     Pupils: Pupils are equal, round, and reactive to light.  Neck:     Vascular: No carotid bruit.  Cardiovascular:     Rate and Rhythm: Normal rate and regular rhythm.     Pulses: Normal pulses.     Heart sounds: Normal heart sounds.  Pulmonary:     Effort: Pulmonary effort is normal. No respiratory distress.     Breath sounds: Normal breath sounds.  Abdominal:     General: Abdomen is flat. Bowel sounds are normal.     Palpations: Abdomen is soft.  Musculoskeletal:        General: Normal range of motion.     Cervical back: Normal range of motion.  Skin:    General:  Skin is warm and dry.  Neurological:     General: No focal deficit present.     Mental Status: She is alert and oriented to person, place, and time. Mental status is at baseline.  Psychiatric:        Mood and Affect: Mood normal.        Behavior: Behavior normal.        Thought Content: Thought content normal.        Judgment: Judgment normal.  Initial Medicare wellness visit   1. Risk factors, based on past  M,S,F - Cardiac Risk Factors include: advanced age (>69men, >36 women)   2.  Physical activities: Dietary issues and exercise activities discussed:      3.  Depression/mood:  Flowsheet Row Video Visit from 12/02/2022 in [facility] Total Score 0        4.  ADL's:    02/01/2023    8:15 AM  In your present state of health, do you have any difficulty performing the following activities:  Hearing? 0  Vision? 0  Difficulty concentrating or making decisions? 0  Walking or climbing stairs? 1  Comment colon cancer  Dressing or bathing? 0  Doing errands, shopping? 0  Preparing Food and eating ? N  Using the Toilet? N  In the past six months, have you accidently leaked urine? N  Do you have problems with loss of bowel control? N  Managing your Medications? N  Managing your Finances? N  Housekeeping or managing your Housekeeping? N     5.  Fall risk:     03/10/2022    1:01 PM 03/31/2022    2:30 PM 10/27/2022    8:43 AM 12/02/2022   11:13 AM 02/01/2023    8:17 AM  Fall Risk  Falls in the past year?   0 0 0  Was there an injury with Fall?   0 0 0  Fall Risk Category Calculator   0 0 0  (RETIRED) Patient Fall Risk Level Low fall risk Low fall risk     Fall risk Follow up   Falls evaluation completed Falls evaluation completed Falls evaluation completed     6.  Home safety: No problems identified   7.  Height weight, and visual acuity: height and weight as above, vision/hearing: Vision Screening   Right eye Left eye Both  eyes  Without correction     With correction 20/20 20/20 20/20      8.  Counseling: Counseling given: Not Answered Tobacco comments: desires patch    9. Lab orders based on risk factors: Laboratory update will be reviewed   10. Cognitive assessment:        02/01/2023    8:18 AM  6CIT Screen  What Year? 0 points  What month? 0 points  What time? 0 points  Count back from 20 0 points  Months in reverse 0 points  Repeat phrase 0 points  Total Score 0 points     11. Screening: Patient provided with a written and personalized 5-10 year screening schedule in the AVS. Health Maintenance  Topic Date Due   Hepatitis C Screening  Never done   COVID-19 Vaccine (2 - Pfizer risk series) 08/11/2019   Flu Shot  11/04/2022   Medicare Annual Wellness Visit  02/01/2024   Mammogram  11/07/2024   Pap with HPV screening  01/28/2025   DTaP/Tdap/Td vaccine (2 - Td or Tdap) 05/08/2031   HIV Screening  Completed   HPV Vaccine  Aged Out   Colon Cancer Screening  Discontinued   Zoster (Shingles) Vaccine  Discontinued    12. Provider List Update: Patient Care Team    Relationship Specialty Notifications Start End  Philip Aspen, Limmie Patricia, MD PCP - General Internal Medicine  03/13/19   Rollene Rotunda, MD PCP - Cardiology Cardiology  11/03/21   Berna Bue, MD Consulting Physician General Surgery  02/15/19   Malachy Mood, MD Consulting Physician Hematology  02/15/19   Pollyann Samples, NP Nurse Practitioner Nurse Practitioner  02/15/19      13. Advance Directives: Does Patient Have a Medical Advance Directive?: No Would patient like information on creating a medical advance directive?: No - Patient declined  14. Opioids: Patient is not on any opioid prescriptions and has no risk factors for a substance use disorder.   15.   Goals      Protect My Health     Timeframe:  Long-Range Goal Priority:  Medium Start Date:                             Expected End Date:                        Follow Up Date 02/01/2024    - schedule and keep appointment for annual check-up    Why is this important?   Screening tests can find diseases early when they are easier to treat.  Your doctor or nurse will talk with you about which tests are important for you.  Getting shots for common diseases like the flu and shingles will help prevent them.     Notes:          I have personally reviewed and noted the following in the patient's chart:   Medical and social history Use of alcohol, tobacco or illicit drugs  Current medications and supplements Functional ability and status Nutritional status Physical activity Advanced directives List of other physicians Hospitalizations, surgeries, and ER visits in previous 12 months Vitals Screenings to include cognitive, depression, and falls Referrals and appointments  In addition, I have reviewed and discussed with patient certain preventive protocols, quality metrics, and best practice recommendations. A written personalized care plan for preventive services as well as general preventive health recommendations were provided to patient.   Impression and Plan:  Initial Medicare annual wellness visit  Immunization due  Chemotherapy-induced peripheral neuropathy (HCC) -     Vitamin B12; Future -     TSH; Future  Adenocarcinoma of colon metastatic to liver [facility])  Primary hypertension -     CBC with Differential/Platelet; Future -     Comprehensive metabolic panel; Future -     Hemoglobin A1c; Future -     Lipid panel; Future  Vitamin D deficiency -     VITAMIN D 25 Hydroxy (Vit-D Deficiency, Fractures); Future  Encounter for hepatitis C screening test for low risk patient -     Hepatitis C antibody; Future   -Recommend routine eye and dental care. -Healthy lifestyle discussed in detail. -Labs to be updated today. -Prostate cancer screening: N/A Health Maintenance  Topic Date Due   Hepatitis C Screening  Never done    COVID-19 Vaccine (2 - Pfizer risk series) 08/11/2019   Flu Shot  11/04/2022   Medicare Annual Wellness Visit  02/01/2024   Mammogram  11/07/2024   Pap with HPV screening  01/28/2025   DTaP/Tdap/Td vaccine (2 - Td or Tdap) 05/08/2031   HIV Screening  Completed   HPV Vaccine  Aged Out   Colon Cancer Screening  Discontinued   Zoster (Shingles) Vaccine  Discontinued     -Flu vaccine in office today. -She does not qualify for colon cancer screening as she has active colon cancer under treatment with colostomy.     Chaya Jan, MD [facility]

## 2023-02-02 LAB — HEPATITIS C ANTIBODY: Hepatitis C Ab: NONREACTIVE

## 2023-02-03 ENCOUNTER — Other Ambulatory Visit: Payer: Self-pay | Admitting: *Deleted

## 2023-02-03 ENCOUNTER — Other Ambulatory Visit: Payer: Self-pay | Admitting: Internal Medicine

## 2023-02-03 ENCOUNTER — Ambulatory Visit (INDEPENDENT_AMBULATORY_CARE_PROVIDER_SITE_OTHER): Payer: Medicare HMO

## 2023-02-03 ENCOUNTER — Encounter: Payer: Self-pay | Admitting: Internal Medicine

## 2023-02-03 DIAGNOSIS — R7989 Other specified abnormal findings of blood chemistry: Secondary | ICD-10-CM

## 2023-02-03 DIAGNOSIS — E038 Other specified hypothyroidism: Secondary | ICD-10-CM | POA: Insufficient documentation

## 2023-02-03 DIAGNOSIS — E039 Hypothyroidism, unspecified: Secondary | ICD-10-CM | POA: Insufficient documentation

## 2023-02-03 DIAGNOSIS — E559 Vitamin D deficiency, unspecified: Secondary | ICD-10-CM

## 2023-02-03 LAB — T4, FREE: Free T4: 0.97 ng/dL (ref 0.60–1.60)

## 2023-02-03 LAB — T3, FREE: T3, Free: 2.6 pg/mL (ref 2.3–4.2)

## 2023-02-03 MED ORDER — VITAMIN D (ERGOCALCIFEROL) 1.25 MG (50000 UNIT) PO CAPS: 50000.0000 [IU] | ORAL_CAPSULE | ORAL | 0 refills | Status: DC

## 2023-02-07 NOTE — Progress Notes (Unsigned)
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 Bowel-gas pattern consistent with small bowel obstruction. No free air. 2. No acute chest findings.   01/23/2019 Imaging   CT AP IMPRESSION: Obstructing mid transverse colonic mass with mild regional adenopathy and hepatic metastatic disease. The mass likely extends through the serosa; no ascites or peritoneal nodularity.   01/24/2019 Surgery   Surgeon: Berna Bue MD Assistant: Mattie Marlin PA-C Procedure performed: Transverse colectomy with end colostomy, liver biopsy Procedure classification: URGENT/EMERGENT Preop diagnosis: Obstructing, metastatic transverse colon mass Post-op diagnosis/intraop findings: Same   01/24/2019 Pathology Results   FINAL MICROSCOPIC DIAGNOSIS:   A. COLON, TRANSVERSE, RESECTION:  Colonic adenocarcinoma, 5 cm.  Carcinoma extends into pericolonic connective tissue and focally to  serosal surface.  Margins not involved.  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   B. LIVER NODULE, LEFT, BIOPSY:  Metastatic adenocarcinoma.    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 Hypermetabolic metastatic disease in the liver and mediastinal/hilar/axillary lymph nodes. 2. Focal hypermetabolism in the rectum. Continued attention on follow-up exams is warranted. 3. Focal hypermetabolism medial to the right adrenal gland may be within a metastatic lymph node, better visualized on 01/23/2019. 4. Aortic atherosclerosis (ICD10-170.0). Coronary artery calcification.   03/12/2019 -  Chemotherapy   She started 5FU q2weeks on 03/12/19 for 2 cycles. She started full dose FOLFOX with Avastin on 04/09/19. Oxaliplatin dose reduced repeatedly due to neuropathy C12 and held since C16 on 10/09/19. Now on maintenance Avastin and 5FU q2weeks since 10/09/19       -Maintenance change to maintenance xeloda 2000 mg BID days 1-14 q21 days and q3 weeks Zirabev (15 mg/kg) starting 01/21/20. First cycle was taken 1000mg  BID due to misunderstanding.    05/31/2019 Imaging   Restaging CT CAP IMPRESSION: 1. Similar to mild interval decrease in size of multiple hepatic lesions, partially calcified. 2. 2 mm right upper lobe pulmonary nodule. Recommend attention on follow-up. 3. Emphysema and aortic atherosclerosis.   08/23/2019 Imaging   CT CAP w contrast  IMPRESSION: 1. The dominant peripheral right liver metastasis has mildly increased. Other smaller liver metastases are stable. 2. Otherwise no new or progressive metastatic disease in the chest, abdomen or pelvis. 3. Aortic Atherosclerosis (ICD10-I70.0) and Emphysema (ICD10-J43.9).   11/27/2019 Imaging   CT CAP w contrast  IMPRESSION: Status post transverse colectomy with right mid abdominal colostomy.   Mildly progressive hepatic metastases, as above.   No evidence of metastatic disease in the chest. Small mediastinal lymph nodes are within normal limits.   Additional stable ancillary  findings as above.    01/21/2020 -  Chemotherapy   Patient is on Treatment Plan : COLORECTAL Bevacizumab q21d     02/21/2020 Imaging   IMPRESSION: 1. Stable hepatic metastatic disease. 2. Aortic atherosclerosis (ICD10-I70.0). Coronary artery calcification. 3.  Emphysema (ICD10-J43.9).   05/19/2020 Imaging   CT CAP  IMPRESSION: 1. Multiple partially calcified liver metastases are again noted. With the exception of a small lesion in segment 7/8 lesions are not significantly changed in the interval. No new liver lesions identified. 2. Coronary artery atherosclerotic calcifications. 3. Aortic atherosclerosis. 4. 2 mm right upper lobe lung nodule identified.  Unchanged.   Aortic Atherosclerosis (ICD10-I70.0).   11/17/2020 Imaging   CT CAP  IMPRESSION: 1. Partially calcified lesions throughout the liver are stable accounting for differences in technique, contrasted imaging on today's study is compared to noncontrast imaging on the prior. 2. No new hepatic lesions. 3. Tiny 3 mm pulmonary nodule in the RIGHT upper lobe unchanged since the prior study. Attention on follow-up. 4. Mild fullness of RIGHT paratracheal nodal tissue is minimally increased and borderline enlarged, attention on follow-up. 5. RIGHT lower quadrant colostomy. 6. 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 7. Aortic atherosclerosis.   06/09/2021 Imaging   EXAM: CT CHEST, ABDOMEN, AND PELVIS WITH CONTRAST  IMPRESSION: Stable calcified liver metastases.   Stable mildly enlarged right paratracheal lymph node.   No evidence of new or progressive metastatic disease.   Aortic Atherosclerosis (ICD10-I70.0).   09/14/2021 PET scan   IMPRESSION: Multiple hypermetabolic and partially calcified liver metastases, without significant change compared to most recent CT.   Mild hypermetabolic mediastinal and bilateral hilar  lymphadenopathy, also without significant change.   No evidence of new or progressive metastatic disease.     Electronically Signed   By: Danae Orleans M.D.   On: 09/15/2021 11:04   04/15/2022 Imaging    IMPRESSION: 1. Several previously noted partially calcified hepatic metastases appear grossly stable compared to the prior examination. No new hepatic lesions or other signs of metastatic disease noted elsewhere in the chest, abdomen or pelvis. 2. Aortic atherosclerosis, in addition to left main and three-vessel coronary artery disease. Please note that although the presence of coronary artery calcium documents the presence of coronary artery disease, the severity of this disease and any potential stenosis cannot be assessed on this non-gated CT examination. Assessment for potential risk factor modification, dietary therapy or pharmacologic therapy may be warranted, if clinically indicated. 3. There are calcifications of the mitral annulus. Echocardiographic correlation for evaluation of potential valvular dysfunction may be warranted if clinically indicated. 4. Additional incidental findings, similar to prior studies, as above.   07/15/2022 Imaging    IMPRESSION: 1. Stable exam. No new or progressive findings in the abdomen or pelvis to suggest worsening disease. 2. No substantial change in calcified liver metastases. 3. Right abdominal transverse end colostomy with parastomal herniation of fat and colon proximal to the stoma. 4. Probable left-sided uterine fibroid measuring on the order of 3.1 cm and displacing the endometrial stripe to the right. Pelvic ultrasound could be used to further evaluate as clinically warranted. 5.  Aortic Atherosclerosis (ICD10-I70.0).        CURRENT THERAPY: Xeloda 2000 mg BID 2 weeks on/1 week of Bevacizumab q3 weeks, starting 01/21/20  INTERVAL HISTORY Ms. Patton returns for follow up as scheduled. Last seen by Dr. Mosetta Putt 01/19/23 with  another  cycle of beva. She continues Xeloda, will start next cycle 11/8. She tolerates treatment very well, no side effects. She had a stomach ache and diarrhea after getting the flu shot this week but this has resolved. PCP also monitoring TSH level with repeat labs in December.   ROS  All other systems reviewed and negative  Past Medical History:  Diagnosis Date   Anemia    low iron   Colon cancer (HCC) 01/2019   Hypertension 01/23/2019   Personal history of chemotherapy 01/2019   colon CA   SBO (small bowel obstruction) (HCC) 01/23/2019     Past Surgical History:  Procedure Laterality Date   CESAREAN SECTION     x2   COLOSTOMY N/A 01/24/2019   Procedure: End Loop Colostomy;  Surgeon: Berna Bue, MD;  Location: [facility] OR;  Service: General;  Laterality: N/A;   PARTIAL COLECTOMY N/A 01/24/2019   Procedure: PARTIAL COLECTOMY;  Surgeon: Berna Bue, MD;  Location: [facility] OR;  Service: General;  Laterality: N/A;   PORTACATH PLACEMENT Right 02/28/2019   Procedure: INSERTION PORT-A-CATH WITH ULTRASOUND GUIDANCE;  Surgeon: Berna Bue, MD;  Location: [facility] OR;  Service: General;  Laterality: Right;     Outpatient Encounter Medications as of 02/09/2023  Medication Sig   acetaminophen (TYLENOL) 325 MG tablet Take 2 tablets (650 mg total) by mouth every 6 (six) hours as needed.   amLODipine (NORVASC) 10 MG tablet TAKE 1 TABLET BY MOUTH EVERY DAY   benzonatate (TESSALON) 100 MG capsule Take 1 capsule (100 mg total) by mouth 2 (two) times daily as needed for cough.   capecitabine (XELODA) 500 MG tablet Take 4 tablets (2,000 mg total) by mouth 2 (two) times daily after a meal. Take for 14 days, then off for 7 days.   ferrous sulfate 325 (65 FE) MG tablet TAKE 1 TABLET BY MOUTH TWICE A DAY WITH FOOD   gabapentin (NEURONTIN) 300 MG capsule Take 1 capsule (300 mg total) by mouth 2 (two) times daily.   hydrALAZINE (APRESOLINE) 25 MG tablet TAKE 1 TABLET BY MOUTH EVERY 8 HOURS    hydrochlorothiazide (HYDRODIURIL) 12.5 MG tablet Take 1 tablet (12.5 mg total) by mouth daily.   hydrocortisone (ANUSOL-HC) 2.5 % rectal cream Place 1 application rectally 2 (two) times daily.   Ibuprofen 200 MG CAPS Take 400 mg by mouth daily as needed (pain).   isosorbide mononitrate (IMDUR) 30 MG 24 hr tablet TAKE 1 TABLET BY MOUTH EVERY DAY   lidocaine (LMX) 4 % cream Apply 1 application topically 3 (three) times daily as needed.   lidocaine-prilocaine (EMLA) cream APPLY 1 APPLICATION TOPICALLY AS NEEDED.   lisinopril (ZESTRIL) 40 MG tablet Take 1 tablet (40 mg total) by mouth daily.   polycarbophil (FIBERCON) 625 MG tablet Take 1 tablet (625 mg total) by mouth daily.   Vitamin D, Ergocalciferol, (DRISDOL) 1.25 MG (50000 UNIT) CAPS capsule Take 1 capsule (50,000 Units total) by mouth every 7 (seven) days for 12 doses.   [DISCONTINUED] prochlorperazine (COMPAZINE) 10 MG tablet Take 1 tablet (10 mg total) by mouth every 6 (six) hours as needed (Nausea or vomiting).   Facility-Administered Encounter Medications as of 02/09/2023  Medication   sodium chloride flush (NS) 0.9 % injection 10 mL     Today's Vitals   02/09/23 0850 02/09/23 0851 02/09/23 0854  BP: (!) 159/76 (!) 145/64   Pulse: 66    Resp: 18    Temp: 97.6 F (36.4 C)    TempSrc: Oral  SpO2: 99%    Weight: 246 lb (111.6 kg)    Height: 5\' 8"  (1.727 m)    PainSc:   0-No pain   Body mass index is 37.4 kg/m.   PHYSICAL EXAM GENERAL:alert, no distress and comfortable SKIN: no rash. Palms dark  EYES: sclera clear NECK: without mass LYMPH:  no palpable cervical or supraclavicular lymphadenopathy  LUNGS: clear with normal breathing effort HEART: regular rate & rhythm ABDOMEN: abdomen soft, non-tender and normal bowel sounds NEURO: alert & oriented x 3 with fluent speech PAC without erythema    CBC    Component Value Date/Time   WBC 8.0 02/09/2023 0831   WBC 6.6 02/01/2023 0847   RBC 3.62 (L) 02/09/2023 0831   HGB  11.7 (L) 02/09/2023 0831   HCT 36.4 02/09/2023 0831   PLT 196 02/09/2023 0831   MCV 100.6 (H) 02/09/2023 0831   MCH 32.3 02/09/2023 0831   MCHC 32.1 02/09/2023 0831   RDW 15.9 (H) 02/09/2023 0831   LYMPHSABS 2.1 02/09/2023 0831   MONOABS 0.7 02/09/2023 0831   EOSABS 0.2 02/09/2023 0831   BASOSABS 0.0 02/09/2023 0831     CMP     Component Value Date/Time   NA 137 02/09/2023 0831   K 4.5 02/09/2023 0831   CL 110 02/09/2023 0831   CO2 20 (L) 02/09/2023 0831   GLUCOSE 90 02/09/2023 0831   BUN 26 (H) 02/09/2023 0831   CREATININE 1.02 (H) 02/09/2023 0831   CREATININE 1.13 (H) 01/19/2023 0825   CALCIUM 9.6 02/09/2023 0831   PROT 8.1 02/09/2023 0831   ALBUMIN 4.0 02/09/2023 0831   AST 18 02/09/2023 0831   AST 19 01/19/2023 0825   ALT 11 02/09/2023 0831   ALT 15 01/19/2023 0825   ALKPHOS 85 02/09/2023 0831   BILITOT 0.6 02/09/2023 0831   BILITOT 0.7 01/19/2023 0825   GFRNONAA >60 02/09/2023 0831   GFRNONAA 55 (L) 01/19/2023 0825   GFRAA >60 01/07/2020 0957     ASSESSMENT & PLAN:Ewelina K Leaman is a 61 y.o. female with   1. 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 -PET from 02/26/19 shows known liver metastasis and metastatic lymphadenopathy in chest and right axilla. -[name] shows MSI stable disease, Kras+ No targetable mutation, she is not a candidate for EGFR or immunotherapy. -Began palliative first line chemo on 03/12/2019, received 5FU/leuc with first 2 cycles for large open abdominal wound after surgery. She started full dose FOLFOX and avastin with cycle 2. Due to neuropathy Oxaliplatin was stopped after 09/24/19, she has been on maintenance therapy.  -For convenience she was switched to oral chemo Xeloda 2 weeks on/1 week off and Bev every 3 weeks in  01/21/2020.   -Ms. Scherzinger appears stable.  Continues Beva q 3 weeks and Xeloda 2000 mg twice daily for 2 weeks on/1 week off, tolerating this regimen very well with essentially no side effects.   -Able to maintain a very good performance status.  No clinical evidence of disease progression -Labs reviewed, adequate to continue therapy.  Proceed with Beva today, and start Xeloda 11/8 -Restage after this cycle, with follow-up and next beva in 4 weeks (for Thanksgiving)  2. Chemotherapy induced peripheral neuropathy (CIPN), G2 -secondary to Oxaliplatin, developed after cycle 12 FOLFOX, oxalic DC after cycle 15 on 06/26/4008 -Stable on gabapentin -No functional deficits  3. Iron deficiency anemia, and anemia secondary to cancer  -She had low ferritin and low TIBC  -IDA responding to ferrous sulfate BID -Stable, monitoring   4. Goals of care discussion  -She understands her cancer is not curable, but treatable, goal is palliative to control disease and prolong her life.  -Responding well and tolerating treatment -Full code     PLAN: -Labs reviewed, proceed with Beva today, and start Xeloda 11/8 - same dose -Restage after this cycle -Follow-up and next beva in 4 weeks (for Thanksgiving)   All questions were answered. The patient knows to call the clinic with any problems, questions or concerns. No barriers to learning were detected.   Santiago Glad, NP-C 02/09/2023

## 2023-02-09 ENCOUNTER — Inpatient Hospital Stay (HOSPITAL_BASED_OUTPATIENT_CLINIC_OR_DEPARTMENT_OTHER): Payer: Medicare HMO | Admitting: Nurse Practitioner

## 2023-02-09 ENCOUNTER — Inpatient Hospital Stay: Payer: Medicare HMO

## 2023-02-09 ENCOUNTER — Encounter: Payer: Self-pay | Admitting: Nurse Practitioner

## 2023-02-09 ENCOUNTER — Inpatient Hospital Stay: Payer: Medicare HMO | Attending: Hematology

## 2023-02-09 VITALS — BP 144/62 | HR 68 | Temp 98.2°F | Resp 18

## 2023-02-09 VITALS — BP 145/64 | HR 66 | Temp 97.6°F | Resp 18 | Ht 68.0 in | Wt 246.0 lb

## 2023-02-09 DIAGNOSIS — C189 Malignant neoplasm of colon, unspecified: Secondary | ICD-10-CM

## 2023-02-09 DIAGNOSIS — Z79899 Other long term (current) drug therapy: Secondary | ICD-10-CM | POA: Insufficient documentation

## 2023-02-09 DIAGNOSIS — C787 Secondary malignant neoplasm of liver and intrahepatic bile duct: Secondary | ICD-10-CM | POA: Insufficient documentation

## 2023-02-09 DIAGNOSIS — C184 Malignant neoplasm of transverse colon: Secondary | ICD-10-CM | POA: Insufficient documentation

## 2023-02-09 DIAGNOSIS — Z7189 Other specified counseling: Secondary | ICD-10-CM

## 2023-02-09 DIAGNOSIS — Z5112 Encounter for antineoplastic immunotherapy: Secondary | ICD-10-CM | POA: Insufficient documentation

## 2023-02-09 DIAGNOSIS — Z95828 Presence of other vascular implants and grafts: Secondary | ICD-10-CM

## 2023-02-09 LAB — COMPREHENSIVE METABOLIC PANEL
ALT: 11 U/L (ref 0–44)
AST: 18 U/L (ref 15–41)
Albumin: 4 g/dL (ref 3.5–5.0)
Alkaline Phosphatase: 85 U/L (ref 38–126)
Anion gap: 7 (ref 5–15)
BUN: 26 mg/dL — ABNORMAL HIGH (ref 8–23)
CO2: 20 mmol/L — ABNORMAL LOW (ref 22–32)
Calcium: 9.6 mg/dL (ref 8.9–10.3)
Chloride: 110 mmol/L (ref 98–111)
Creatinine, Ser: 1.02 mg/dL — ABNORMAL HIGH (ref 0.44–1.00)
GFR, Estimated: >60 mL/min (ref >60)
Glucose, Bld: 90 mg/dL (ref 70–99)
Potassium: 4.5 mmol/L (ref 3.5–5.1)
Sodium: 137 mmol/L (ref 135–145)
Total Bilirubin: 0.6 mg/dL (ref <1.2)
Total Protein: 8.1 g/dL (ref 6.5–8.1)

## 2023-02-09 LAB — CBC WITH DIFFERENTIAL (CANCER CENTER ONLY)
Abs Immature Granulocytes: 0.02 10*3/uL (ref 0.00–0.07)
Basophils Absolute: 0 10*3/uL (ref 0.0–0.1)
Basophils Relative: 1 %
Eosinophils Absolute: 0.2 10*3/uL (ref 0.0–0.5)
Eosinophils Relative: 3 %
HCT: 36.4 % (ref 36.0–46.0)
Hemoglobin: 11.7 g/dL — ABNORMAL LOW (ref 12.0–15.0)
Immature Granulocytes: 0 %
Lymphocytes Relative: 26 %
Lymphs Abs: 2.1 10*3/uL (ref 0.7–4.0)
MCH: 32.3 pg (ref 26.0–34.0)
MCHC: 32.1 g/dL (ref 30.0–36.0)
MCV: 100.6 fL — ABNORMAL HIGH (ref 80.0–100.0)
Monocytes Absolute: 0.7 10*3/uL (ref 0.1–1.0)
Monocytes Relative: 8 %
Neutro Abs: 5 10*3/uL (ref 1.7–7.7)
Neutrophils Relative %: 62 %
Platelet Count: 196 10*3/uL (ref 150–400)
RBC: 3.62 MIL/uL — ABNORMAL LOW (ref 3.87–5.11)
RDW: 15.9 % — ABNORMAL HIGH (ref 11.5–15.5)
WBC Count: 8 10*3/uL (ref 4.0–10.5)
nRBC: 0 % (ref 0.0–0.2)

## 2023-02-09 LAB — TOTAL PROTEIN, URINE DIPSTICK: Protein, ur: NEGATIVE mg/dL

## 2023-02-09 MED ORDER — BEVACIZUMAB-ADCD CHEMO INJECTION 400 MG/16ML
7.5000 mg/kg | Freq: Once | INTRAVENOUS | Status: AC
  Administered 2023-02-09: 800 mg via INTRAVENOUS
  Filled 2023-02-09: qty 32

## 2023-02-09 MED ORDER — SODIUM CHLORIDE 0.9 % IV SOLN: Freq: Once | INTRAVENOUS | Status: AC

## 2023-02-09 MED ORDER — SODIUM CHLORIDE 0.9% FLUSH
10.0000 mL | Freq: Once | INTRAVENOUS | Status: AC
  Administered 2023-02-09: 10 mL

## 2023-02-09 NOTE — Patient Instructions (Signed)
[facility] - A DEPT OF [facility]  Discharge Instructions: Thank you for choosing [facility] to provide your oncology and hematology care.   If you have a lab appointment with the Cancer Center, please go directly to the Cancer Center and check in at the registration area.   Wear comfortable clothing and clothing appropriate for easy access to any Portacath or PICC line.   We strive to give you quality time with your provider. You may need to reschedule your appointment if you arrive late (15 or more minutes).  Arriving late affects you and other patients whose appointments are after yours.  Also, if you miss three or more appointments without notifying the office, you may be dismissed from the clinic at the provider's discretion.      For prescription refill requests, have your pharmacy contact our office and allow 72 hours for refills to be completed.    Today you received the following chemotherapy and/or immunotherapy agents :  Bevacizumab      To help prevent nausea and vomiting after your treatment, we encourage you to take your nausea medication as directed.  BELOW ARE SYMPTOMS THAT SHOULD BE REPORTED IMMEDIATELY: *FEVER GREATER THAN 100.4 F (38 C) OR HIGHER *CHILLS OR SWEATING *NAUSEA AND VOMITING THAT IS NOT CONTROLLED WITH YOUR NAUSEA MEDICATION *UNUSUAL SHORTNESS OF BREATH *UNUSUAL BRUISING OR BLEEDING *URINARY PROBLEMS (pain or burning when urinating, or frequent urination) *BOWEL PROBLEMS (unusual diarrhea, constipation, pain near the anus) TENDERNESS IN MOUTH AND THROAT WITH OR WITHOUT PRESENCE OF ULCERS (sore throat, sores in mouth, or a toothache) UNUSUAL RASH, SWELLING OR PAIN  UNUSUAL VAGINAL DISCHARGE OR ITCHING   Items with * indicate a potential emergency and should be followed up as soon as possible or go to the Emergency Department if any problems should occur.  Please show the CHEMOTHERAPY ALERT CARD or  IMMUNOTHERAPY ALERT CARD at check-in to the Emergency Department and triage nurse.  Should you have questions after your visit or need to cancel or reschedule your appointment, please contact [facility] - A DEPT OF [facility]  Dept: [phone]  and follow the prompts.  Office hours are 8:00 a.m. to 4:30 p.m. Monday - Friday. Please note that voicemails left after 4:00 p.m. may not be returned until the following business day.  We are closed weekends and major holidays. You have access to a nurse at all times for urgent questions. Please call the main number to the clinic Dept: [phone] and follow the prompts.   For any non-urgent questions, you may also contact your provider using MyChart. We now offer e-Visits for anyone 52 and older to request care online for non-urgent symptoms. For details visit [email or web address].   Also download the MyChart app! Go to the app store, search "MyChart", open the app, select [facility], and log in with your MyChart username and password.

## 2023-03-02 ENCOUNTER — Ambulatory Visit: Payer: Medicare HMO

## 2023-03-02 ENCOUNTER — Ambulatory Visit: Payer: Medicare HMO | Admitting: Hematology

## 2023-03-02 ENCOUNTER — Other Ambulatory Visit: Payer: Medicare HMO

## 2023-03-09 ENCOUNTER — Inpatient Hospital Stay (HOSPITAL_BASED_OUTPATIENT_CLINIC_OR_DEPARTMENT_OTHER): Payer: Medicare HMO | Admitting: Hematology

## 2023-03-09 ENCOUNTER — Inpatient Hospital Stay: Payer: Medicare HMO

## 2023-03-09 ENCOUNTER — Encounter: Payer: Self-pay | Admitting: Hematology

## 2023-03-09 ENCOUNTER — Inpatient Hospital Stay: Payer: Medicare HMO | Attending: Hematology

## 2023-03-09 VITALS — BP 149/66 | HR 58 | Temp 97.2°F | Resp 18 | Wt 247.2 lb

## 2023-03-09 VITALS — BP 133/60 | HR 55

## 2023-03-09 DIAGNOSIS — C189 Malignant neoplasm of colon, unspecified: Secondary | ICD-10-CM

## 2023-03-09 DIAGNOSIS — C787 Secondary malignant neoplasm of liver and intrahepatic bile duct: Secondary | ICD-10-CM | POA: Diagnosis not present

## 2023-03-09 DIAGNOSIS — Z7189 Other specified counseling: Secondary | ICD-10-CM

## 2023-03-09 DIAGNOSIS — Z79899 Other long term (current) drug therapy: Secondary | ICD-10-CM | POA: Insufficient documentation

## 2023-03-09 DIAGNOSIS — Z5112 Encounter for antineoplastic immunotherapy: Secondary | ICD-10-CM | POA: Diagnosis not present

## 2023-03-09 DIAGNOSIS — Z95828 Presence of other vascular implants and grafts: Secondary | ICD-10-CM

## 2023-03-09 DIAGNOSIS — C184 Malignant neoplasm of transverse colon: Secondary | ICD-10-CM | POA: Insufficient documentation

## 2023-03-09 DIAGNOSIS — C779 Secondary and unspecified malignant neoplasm of lymph node, unspecified: Secondary | ICD-10-CM | POA: Diagnosis not present

## 2023-03-09 LAB — COMPREHENSIVE METABOLIC PANEL
ALT: 10 U/L (ref 0–44)
AST: 18 U/L (ref 15–41)
Albumin: 3.8 g/dL (ref 3.5–5.0)
Alkaline Phosphatase: 83 U/L (ref 38–126)
Anion gap: 4 — ABNORMAL LOW (ref 5–15)
BUN: 24 mg/dL — ABNORMAL HIGH (ref 8–23)
CO2: 22 mmol/L (ref 22–32)
Calcium: 9.7 mg/dL (ref 8.9–10.3)
Chloride: 111 mmol/L (ref 98–111)
Creatinine, Ser: 1.05 mg/dL — ABNORMAL HIGH (ref 0.44–1.00)
GFR, Estimated: >60 mL/min (ref >60)
Glucose, Bld: 98 mg/dL (ref 70–99)
Potassium: 4.6 mmol/L (ref 3.5–5.1)
Sodium: 137 mmol/L (ref 135–145)
Total Bilirubin: 0.4 mg/dL (ref <1.2)
Total Protein: 7.4 g/dL (ref 6.5–8.1)

## 2023-03-09 LAB — CBC WITH DIFFERENTIAL (CANCER CENTER ONLY)
Abs Immature Granulocytes: 0.02 10*3/uL (ref 0.00–0.07)
Basophils Absolute: 0 10*3/uL (ref 0.0–0.1)
Basophils Relative: 1 %
Eosinophils Absolute: 0.2 10*3/uL (ref 0.0–0.5)
Eosinophils Relative: 3 %
HCT: 36.4 % (ref 36.0–46.0)
Hemoglobin: 11.5 g/dL — ABNORMAL LOW (ref 12.0–15.0)
Immature Granulocytes: 0 %
Lymphocytes Relative: 28 %
Lymphs Abs: 2 10*3/uL (ref 0.7–4.0)
MCH: 32.3 pg (ref 26.0–34.0)
MCHC: 31.6 g/dL (ref 30.0–36.0)
MCV: 102.2 fL — ABNORMAL HIGH (ref 80.0–100.0)
Monocytes Absolute: 0.5 10*3/uL (ref 0.1–1.0)
Monocytes Relative: 7 %
Neutro Abs: 4.6 10*3/uL (ref 1.7–7.7)
Neutrophils Relative %: 61 %
Platelet Count: 173 10*3/uL (ref 150–400)
RBC: 3.56 MIL/uL — ABNORMAL LOW (ref 3.87–5.11)
RDW: 16 % — ABNORMAL HIGH (ref 11.5–15.5)
WBC Count: 7.4 10*3/uL (ref 4.0–10.5)
nRBC: 0 % (ref 0.0–0.2)

## 2023-03-09 LAB — CEA (ACCESS): CEA (CHCC): 161.27 ng/mL — ABNORMAL HIGH (ref 0.00–5.00)

## 2023-03-09 LAB — TOTAL PROTEIN, URINE DIPSTICK: Protein, ur: NEGATIVE mg/dL

## 2023-03-09 MED ORDER — HEPARIN SOD (PORK) LOCK FLUSH 100 UNIT/ML IV SOLN
500.0000 [IU] | Freq: Once | INTRAVENOUS | Status: DC | PRN
Start: 2023-03-09 — End: 2023-03-09

## 2023-03-09 MED ORDER — SODIUM CHLORIDE 0.9 % IV SOLN
7.5000 mg/kg | Freq: Once | INTRAVENOUS | Status: AC
  Administered 2023-03-09: 800 mg via INTRAVENOUS
  Filled 2023-03-09: qty 32

## 2023-03-09 MED ORDER — SODIUM CHLORIDE 0.9 % IV SOLN: Freq: Once | INTRAVENOUS | Status: AC

## 2023-03-09 NOTE — Assessment & Plan Note (Signed)
[id number] stage IV with liver and nodal metastasis, MSS, KRAS G12S(+)  -Diagnosed in 01/2019 after emergent colectomy and liver biopsy. 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  -Started first line chemo on 03/12/2019, received 5FU/leuc with first 2 cycles for large open abdominal wound after surgery. She started full dose FOLFOX and avastin with cycle 2. Due to neuropathy Oxaliplatin was stopped after 09/24/19.  --For convenience she was switched to oral chemo Xeloda 2 weeks on/1 week off and Bev every 3 weeks in 01/21/20. She is tolerating well -Her restaging CT scan from 11/03/2022 showed stable liver metastasis, no other new lesions. I personally reviewed with pt  -She is clinically doing well, will continue current therapy. -Since she is tolerating treatment very well, I will see her every other treatment (6 week)

## 2023-03-09 NOTE — Progress Notes (Signed)
[facility]   Telephone:([phone]) [phone] Fax:(336) [phone]   Clinic Follow up Note   Patient Care Team: Philip Aspen, Limmie Patricia, MD as PCP - General (Internal Medicine) Rollene Rotunda, MD as PCP - Cardiology (Cardiology) Berna Bue, MD as Consulting Physician (General Surgery) Malachy Mood, MD as Consulting Physician (Hematology) Pollyann Samples, NP as Nurse Practitioner (Nurse Practitioner)  Date of Service:  03/09/2023  CHIEF COMPLAINT: f/u of metastatic colon cancer  CURRENT THERAPY:  Maintenance capecitabine and bevacizumab  Oncology History   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  -Started first line chemo on 03/12/2019, received 5FU/leuc with first 2 cycles for large open abdominal wound after surgery. She started full dose FOLFOX and avastin with cycle 2. Due to neuropathy Oxaliplatin was stopped after 09/24/19.  --For convenience she was switched to oral chemo Xeloda 2 weeks on/1 week off and Bev every 3 weeks in 01/21/20. She is tolerating well -Her restaging CT scan from 11/03/2022 showed stable liver metastasis, no other new lesions. I personally reviewed with pt  -She is clinically doing well, will continue current therapy. -Since she is tolerating treatment very well, I will see her every other treatment (6 week)    Assessment and Plan    Metastatic Colon Cancer 62 year old with metastatic colon cancer on chemotherapy. Noted skin hyperpigmentation and dryness likely secondary to treatment. Blood counts stable: WBC normal, hemoglobin 11.5, negative urine protein. Normal kidney and liver functions. Tumor marker checked today. CT scan scheduled for Friday at 3 PM to evaluate disease status. Discussed potential delay in CT results and  possible treatment adjustments based on findings. Patient uses MyChart and may see results before consultation. - Continue chemotherapy regimen every three weeks - Review CT scan results when available - Follow-up appointment in three weeks to review scan results - Adjust treatment plan if CT shows concerning findings - Phone visit next week for immediate concerns  Hypertension Blood pressure slightly elevated, possibly due to recent dietary intake. On antihypertensive medication. 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 Bowel-gas pattern consistent with small bowel obstruction. No free air. 2. No acute chest findings.   01/23/2019 Imaging   CT AP IMPRESSION: Obstructing mid transverse colonic mass with mild regional adenopathy and hepatic metastatic disease. The mass likely extends through the serosa; no ascites or peritoneal nodularity.   01/24/2019 Surgery   Surgeon: Berna Bue MD Assistant: Mattie Marlin PA-C Procedure performed: Transverse colectomy with end colostomy, liver biopsy Procedure classification: URGENT/EMERGENT Preop diagnosis: Obstructing, metastatic transverse colon mass Post-op diagnosis/intraop findings:  Same  01/24/2019 Pathology Results   FINAL MICROSCOPIC DIAGNOSIS:   A. COLON, TRANSVERSE, RESECTION:  Colonic adenocarcinoma, 5 cm.  Carcinoma extends into pericolonic connective tissue and focally to  serosal surface.  Margins not involved.  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   B. LIVER NODULE, LEFT, BIOPSY:  Metastatic adenocarcinoma.    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 Hypermetabolic metastatic disease in the liver and mediastinal/hilar/axillary lymph nodes. 2. Focal hypermetabolism in the rectum. Continued attention on follow-up exams is warranted. 3. Focal hypermetabolism medial to the right adrenal gland may be within a metastatic lymph node, better visualized on 01/23/2019. 4. Aortic atherosclerosis (ICD10-170.0). Coronary artery calcification.   03/12/2019 -  Chemotherapy   She started 5FU q2weeks on 03/12/19 for 2 cycles. She started full dose FOLFOX with Avastin on 04/09/19. Oxaliplatin dose reduced repeatedly due to neuropathy C12 and held since C16 on 10/09/19. Now on maintenance Avastin and 5FU q2weeks since 10/09/19       -Maintenance change to maintenance xeloda 2000 mg BID days 1-14 q21 days and q3 weeks Zirabev (15 mg/kg) starting 01/21/20. First cycle was taken 1000mg  BID due to misunderstanding.    05/31/2019 Imaging   Restaging CT CAP IMPRESSION: 1. Similar to mild interval decrease in size of multiple hepatic lesions, partially calcified. 2. 2 mm right upper lobe pulmonary nodule. Recommend attention on follow-up. 3. Emphysema and aortic atherosclerosis.   08/23/2019 Imaging   CT CAP w contrast  IMPRESSION: 1. The dominant peripheral right liver metastasis has  mildly increased. Other smaller liver metastases are stable. 2. Otherwise no new or progressive metastatic disease in the chest, abdomen or pelvis. 3. Aortic Atherosclerosis (ICD10-I70.0) and Emphysema (ICD10-J43.9).   11/27/2019 Imaging   CT CAP w contrast  IMPRESSION: Status post transverse colectomy with right mid abdominal colostomy.   Mildly progressive hepatic metastases, as above.   No evidence of metastatic disease in the chest. Small mediastinal lymph nodes are within normal limits.   Additional stable ancillary findings as above.   01/21/2020 -  Chemotherapy   Patient is on Treatment Plan : COLORECTAL Bevacizumab q21d     02/21/2020 Imaging   IMPRESSION: 1. Stable hepatic metastatic disease. 2. Aortic atherosclerosis (ICD10-I70.0). Coronary artery calcification. 3.  Emphysema (ICD10-J43.9).   05/19/2020 Imaging   CT CAP  IMPRESSION: 1. Multiple partially calcified liver metastases are again noted. With the exception of a small lesion in segment 7/8 lesions are not significantly changed in the interval. No new liver lesions identified. 2. Coronary artery atherosclerotic calcifications. 3. Aortic atherosclerosis. 4. 2 mm right upper lobe lung nodule identified.  Unchanged.   Aortic Atherosclerosis (ICD10-I70.0).   11/17/2020 Imaging   CT CAP  IMPRESSION: 1. Partially calcified lesions throughout the liver are stable accounting for differences in technique, contrasted imaging on today's study is compared to noncontrast imaging on the prior. 2. No new hepatic lesions. 3. Tiny 3 mm pulmonary nodule in the RIGHT upper lobe unchanged since the prior study. Attention on follow-up. 4. Mild fullness of RIGHT paratracheal nodal tissue is minimally increased and borderline enlarged, attention on follow-up. 5. RIGHT lower quadrant colostomy. 6. 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 7. Aortic atherosclerosis.   06/09/2021 Imaging   EXAM: CT CHEST, ABDOMEN, AND PELVIS WITH CONTRAST  IMPRESSION: Stable calcified liver metastases.   Stable mildly enlarged right paratracheal lymph node.   No evidence of new or progressive metastatic disease.   Aortic Atherosclerosis (ICD10-I70.0).   09/14/2021 PET scan   IMPRESSION: Multiple hypermetabolic and partially calcified liver metastases, without significant change compared to most recent CT.   Mild hypermetabolic mediastinal and bilateral hilar lymphadenopathy, also without significant change.   No evidence of new or progressive metastatic disease.     Electronically Signed   By: Danae Orleans M.D.   On: 09/15/2021 11:04   04/15/2022 Imaging    IMPRESSION: 1. Several previously noted partially calcified hepatic metastases appear grossly stable compared to the prior examination. No new hepatic lesions or other signs of metastatic disease noted elsewhere in the chest, abdomen or pelvis. 2. Aortic atherosclerosis, in addition to left main and three-vessel coronary artery disease. Please note that although the presence of coronary artery calcium documents the presence of coronary artery disease, the severity of this disease and any potential stenosis cannot be assessed on this non-gated CT examination. Assessment for potential risk factor modification, dietary therapy or pharmacologic therapy may be warranted, if clinically indicated. 3. There are calcifications of the mitral annulus. Echocardiographic correlation for evaluation of potential valvular dysfunction may be warranted if clinically indicated. 4. Additional incidental findings, similar to prior studies, as above.   07/15/2022 Imaging    IMPRESSION: 1. Stable exam. No new or progressive findings in the abdomen or pelvis to suggest worsening disease. 2. No substantial change in calcified liver metastases. 3. Right  abdominal transverse end colostomy with parastomal herniation of fat and colon proximal to the stoma. 4. Probable left-sided uterine fibroid measuring on the order of 3.1 cm and displacing the endometrial stripe to the right. Pelvic ultrasound could be used to further evaluate as clinically warranted. 5.  Aortic Atherosclerosis (ICD10-I70.0).        Discussed the use of AI scribe software for clinical note transcription with the patient, who gave verbal consent to proceed.  History of Present Illness   The patient, a 62 year old with metastatic colon cancer, presents for a routine follow-up. She reports no new issues and is tolerating her chemotherapy well. She has noticed some skin darkening, which she attributes to the chemotherapy, but denies any discomfort. She has been diligent about moisturizing her skin. She also reports a slightly elevated blood pressure, which she monitors at home. She attributes the recent elevation to dietary factors. She is scheduled for a CT scan and is aware that the results may take a couple of weeks. She is comfortable with accessing her results through MyChart.         All other systems were reviewed with the patient and are negative.  MEDICAL HISTORY:  Past Medical History:  Diagnosis Date   Anemia    low iron   Colon cancer (HCC) 01/2019   Hypertension 01/23/2019   Personal history of chemotherapy 01/2019   colon CA   SBO (small bowel obstruction) (HCC) 01/23/2019    SURGICAL HISTORY: Past Surgical History:  Procedure Laterality Date   CESAREAN SECTION     x2   COLOSTOMY N/A 01/24/2019   Procedure: End Loop Colostomy;  Surgeon: Berna Bue, MD;  Location: [facility] OR;  Service: General;  Laterality: N/A;   PARTIAL COLECTOMY N/A 01/24/2019  Procedure: PARTIAL COLECTOMY;  Surgeon: Berna Bue, MD;  Location: [facility] OR;  Service: General;  Laterality: N/A;   PORTACATH PLACEMENT Right 02/28/2019   Procedure: INSERTION PORT-A-CATH WITH  ULTRASOUND GUIDANCE;  Surgeon: Berna Bue, MD;  Location: [facility] OR;  Service: General;  Laterality: Right;    I have reviewed the social history and family history with the patient and they are unchanged from previous note.  ALLERGIES:  has No Known Allergies.  MEDICATIONS:  Current Outpatient Medications  Medication Sig Dispense Refill   acetaminophen (TYLENOL) 325 MG tablet Take 2 tablets (650 mg total) by mouth every 6 (six) hours as needed.     amLODipine (NORVASC) 10 MG tablet TAKE 1 TABLET BY MOUTH EVERY DAY 90 tablet 1   benzonatate (TESSALON) 100 MG capsule Take 1 capsule (100 mg total) by mouth 2 (two) times daily as needed for cough. 20 capsule 0   capecitabine (XELODA) 500 MG tablet Take 4 tablets (2,000 mg total) by mouth 2 (two) times daily after a meal. Take for 14 days, then off for 7 days. 112 tablet 2   ferrous sulfate 325 (65 FE) MG tablet TAKE 1 TABLET BY MOUTH TWICE A DAY WITH FOOD 180 tablet 1   gabapentin (NEURONTIN) 300 MG capsule Take 1 capsule (300 mg total) by mouth 2 (two) times daily. 60 capsule 3   hydrALAZINE (APRESOLINE) 25 MG tablet TAKE 1 TABLET BY MOUTH EVERY 8 HOURS 270 tablet 0   hydrochlorothiazide (HYDRODIURIL) 12.5 MG tablet Take 1 tablet (12.5 mg total) by mouth daily. 90 tablet 1   hydrocortisone (ANUSOL-HC) 2.5 % rectal cream Place 1 application rectally 2 (two) times daily. 30 g 0   Ibuprofen 200 MG CAPS Take 400 mg by mouth daily as needed (pain).     isosorbide mononitrate (IMDUR) 30 MG 24 hr tablet TAKE 1 TABLET BY MOUTH EVERY DAY 90 tablet 0   lidocaine (LMX) 4 % cream Apply 1 application topically 3 (three) times daily as needed. 30 g 0   lidocaine-prilocaine (EMLA) cream APPLY 1 APPLICATION TOPICALLY AS NEEDED. 30 g 1   lisinopril (ZESTRIL) 40 MG tablet Take 1 tablet (40 mg total) by mouth daily. 90 tablet 1   polycarbophil (FIBERCON) 625 MG tablet Take 1 tablet (625 mg total) by mouth daily. 30 tablet 0   Vitamin D, Ergocalciferol,  (DRISDOL) 1.25 MG (50000 UNIT) CAPS capsule Take 1 capsule (50,000 Units total) by mouth every 7 (seven) days for 12 doses. 12 capsule 0   No current facility-administered medications for this visit.   Facility-Administered Medications Ordered in Other Visits  Medication Dose Route Frequency Provider Last Rate Last Admin   sodium chloride flush (NS) 0.9 % injection 10 mL  10 mL Intracatheter PRN Malachy Mood, MD   10 mL at 06/06/20 1051    PHYSICAL EXAMINATION: ECOG PERFORMANCE STATUS: 0 - Asymptomatic  Vitals:   03/09/23 0952  BP: (!) 149/66  Pulse: (!) 58  Resp: 18  Temp: (!) 97.2 F (36.2 C)  SpO2: 100%   Wt Readings from Last 3 Encounters:  03/09/23 247 lb 3.2 oz (112.1 kg)  02/09/23 246 lb (111.6 kg)  02/01/23 247 lb 9.6 oz (112.3 kg)     GENERAL:alert, no distress and comfortable SKIN: skin color, texture, turgor are normal, no rashes or significant lesions except skin hyperpigmentation in hands EYES: normal, Conjunctiva are pink and non-injected, sclera clear NECK: supple, thyroid normal size, non-tender, without nodularity LYMPH:  no palpable lymphadenopathy in  the cervical, axillary  LUNGS: clear to auscultation and percussion with normal breathing effort HEART: regular rate & rhythm and no murmurs and no lower extremity edema ABDOMEN:abdomen soft, non-tender and normal bowel sounds Musculoskeletal:no cyanosis of digits and no clubbing  NEURO: alert & oriented x 3 with fluent speech, no focal motor/sensory deficits   LABORATORY DATA:  I have reviewed the data as listed    Latest Ref Rng & Units 03/09/2023    8:53 AM 02/09/2023    8:31 AM 02/01/2023    8:47 AM  CBC  WBC 4.0 - 10.5 K/uL 7.4  8.0  6.6   Hemoglobin 12.0 - 15.0 g/dL 16.1  09.6  04.5   Hematocrit 36.0 - 46.0 % 36.4  36.4  39.3   Platelets 150 - 400 K/uL 173  196  179.0         Latest Ref Rng & Units 03/09/2023    8:53 AM 02/09/2023    8:31 AM 02/01/2023    8:47 AM  CMP  Glucose 70 - 99 mg/dL 98   90  73   BUN 8 - 23 mg/dL 24  26  22    Creatinine 0.44 - 1.00 mg/dL 4.09  8.11  9.14   Sodium 135 - 145 mmol/L 137  137  137   Potassium 3.5 - 5.1 mmol/L 4.6  4.5  4.3   Chloride 98 - 111 mmol/L 111  110  109   CO2 22 - 32 mmol/L 22  20  21    Calcium 8.9 - 10.3 mg/dL 9.7  9.6  78.2   Total Protein 6.5 - 8.1 g/dL 7.4  8.1  8.4   Total Bilirubin <1.2 mg/dL 0.4  0.6  0.5   Alkaline Phos 38 - 126 U/L 83  85  85   AST 15 - 41 U/L 18  18  20    ALT 0 - 44 U/L 10  11  11        RADIOGRAPHIC STUDIES: I have personally reviewed the radiological images as listed and agreed with the findings in the report. No results found.    Orders Placed This Encounter  Procedures   CEA (Access)-[facility] ONLY    Standing Status:   Standing    Number of Occurrences:   50    Standing Expiration Date:   03/09/2024   CBC with Differential (Cancer Center Only)    Standing Status:   Future    Standing Expiration Date:   03/30/2024   Total Protein, Urine dipstick    Standing Status:   Future    Standing Expiration Date:   03/30/2024   CBC with Differential (Cancer Center Only)    Standing Status:   Future    Standing Expiration Date:   04/20/2024   Total Protein, Urine dipstick    Standing Status:   Future    Standing Expiration Date:   04/20/2024   CBC with Differential (Cancer Center Only)    Standing Status:   Future    Standing Expiration Date:   05/11/2024   Total Protein, Urine dipstick    Standing Status:   Future    Standing Expiration Date:   05/11/2024   All questions were answered. The patient knows to call the clinic with any problems, questions or concerns. No barriers to learning was detected. The total time spent in the appointment was 25 minutes.     Malachy Mood, MD 03/09/2023

## 2023-03-09 NOTE — Patient Instructions (Signed)
[facility] - A DEPT OF [facility]  Discharge Instructions: Thank you for choosing [facility] to provide your oncology and hematology care.   If you have a lab appointment with the Cancer Center, please go directly to the Cancer Center and check in at the registration area.   Wear comfortable clothing and clothing appropriate for easy access to any Portacath or PICC line.   We strive to give you quality time with your provider. You may need to reschedule your appointment if you arrive late (15 or more minutes).  Arriving late affects you and other patients whose appointments are after yours.  Also, if you miss three or more appointments without notifying the office, you may be dismissed from the clinic at the provider's discretion.      For prescription refill requests, have your pharmacy contact our office and allow 72 hours for refills to be completed.    Today you received the following chemotherapy and/or immunotherapy agents: Vegzelma      To help prevent nausea and vomiting after your treatment, we encourage you to take your nausea medication as directed.  BELOW ARE SYMPTOMS THAT SHOULD BE REPORTED IMMEDIATELY: *FEVER GREATER THAN 100.4 F (38 C) OR HIGHER *CHILLS OR SWEATING *NAUSEA AND VOMITING THAT IS NOT CONTROLLED WITH YOUR NAUSEA MEDICATION *UNUSUAL SHORTNESS OF BREATH *UNUSUAL BRUISING OR BLEEDING *URINARY PROBLEMS (pain or burning when urinating, or frequent urination) *BOWEL PROBLEMS (unusual diarrhea, constipation, pain near the anus) TENDERNESS IN MOUTH AND THROAT WITH OR WITHOUT PRESENCE OF ULCERS (sore throat, sores in mouth, or a toothache) UNUSUAL RASH, SWELLING OR PAIN  UNUSUAL VAGINAL DISCHARGE OR ITCHING   Items with * indicate a potential emergency and should be followed up as soon as possible or go to the Emergency Department if any problems should occur.  Please show the CHEMOTHERAPY ALERT CARD or IMMUNOTHERAPY  ALERT CARD at check-in to the Emergency Department and triage nurse.  Should you have questions after your visit or need to cancel or reschedule your appointment, please contact [facility] - A DEPT OF [facility]  Dept: [phone]  and follow the prompts.  Office hours are 8:00 a.m. to 4:30 p.m. Monday - Friday. Please note that voicemails left after 4:00 p.m. may not be returned until the following business day.  We are closed weekends and major holidays. You have access to a nurse at all times for urgent questions. Please call the main number to the clinic Dept: [phone] and follow the prompts.   For any non-urgent questions, you may also contact your provider using MyChart. We now offer e-Visits for anyone 10 and older to request care online for non-urgent symptoms. For details visit [email or web address].   Also download the MyChart app! Go to the app store, search "MyChart", open the app, select [place], and log in with your MyChart username and password.

## 2023-03-11 ENCOUNTER — Telehealth: Payer: Self-pay | Admitting: Hematology

## 2023-03-11 ENCOUNTER — Ambulatory Visit (HOSPITAL_COMMUNITY)
Admission: RE | Admit: 2023-03-11 | Discharge: 2023-03-11 | Disposition: A | Discharge status: Home / Self Care | Payer: Medicare HMO | Source: Ambulatory Visit | Attending: Hematology | Admitting: Hematology

## 2023-03-11 DIAGNOSIS — C189 Malignant neoplasm of colon, unspecified: Secondary | ICD-10-CM | POA: Diagnosis not present

## 2023-03-11 DIAGNOSIS — C787 Secondary malignant neoplasm of liver and intrahepatic bile duct: Secondary | ICD-10-CM | POA: Insufficient documentation

## 2023-03-11 DIAGNOSIS — J439 Emphysema, unspecified: Secondary | ICD-10-CM | POA: Diagnosis not present

## 2023-03-11 DIAGNOSIS — K769 Liver disease, unspecified: Secondary | ICD-10-CM | POA: Diagnosis not present

## 2023-03-11 MED ORDER — IOHEXOL 300 MG/ML  SOLN
100.0000 mL | Freq: Once | INTRAMUSCULAR | Status: AC | PRN
  Administered 2023-03-11: 100 mL via INTRAVENOUS

## 2023-03-11 MED ORDER — HEPARIN SOD (PORK) LOCK FLUSH 100 UNIT/ML IV SOLN
500.0000 [IU] | Freq: Once | INTRAVENOUS | Status: AC
  Administered 2023-03-11: 500 [IU] via INTRAVENOUS

## 2023-03-16 ENCOUNTER — Encounter: Payer: Self-pay | Admitting: Hematology

## 2023-03-16 ENCOUNTER — Inpatient Hospital Stay (HOSPITAL_BASED_OUTPATIENT_CLINIC_OR_DEPARTMENT_OTHER): Payer: Medicare HMO | Admitting: Hematology

## 2023-03-16 DIAGNOSIS — Z7189 Other specified counseling: Secondary | ICD-10-CM

## 2023-03-16 DIAGNOSIS — C189 Malignant neoplasm of colon, unspecified: Secondary | ICD-10-CM

## 2023-03-16 DIAGNOSIS — C787 Secondary malignant neoplasm of liver and intrahepatic bile duct: Secondary | ICD-10-CM

## 2023-03-16 NOTE — Assessment & Plan Note (Signed)
[id number] stage IV with liver and nodal metastasis, MSS, KRAS G12S(+)  -Diagnosed in 01/2019 after emergent colectomy and liver biopsy. 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  -Started first line chemo on 03/12/2019, received 5FU/leuc with first 2 cycles for large open abdominal wound after surgery. She started full dose FOLFOX and avastin with cycle 2. Due to neuropathy Oxaliplatin was stopped after 09/24/19.  --For convenience she was switched to oral chemo Xeloda 2 weeks on/1 week off and Bev every 3 weeks in 01/21/20. She is tolerating well -Her restaging CT scan from 03/11/2023 showed stable liver metastasis, no other new lesions. I personally reviewed with pt  -She is clinically doing well, will continue current therapy. -Since she is tolerating treatment very well, I will see her every other treatment (6 week)

## 2023-03-17 ENCOUNTER — Encounter: Payer: Self-pay | Admitting: Hematology

## 2023-03-17 ENCOUNTER — Ambulatory Visit: Payer: Medicare HMO

## 2023-03-17 ENCOUNTER — Other Ambulatory Visit: Payer: Self-pay

## 2023-03-17 ENCOUNTER — Other Ambulatory Visit (INDEPENDENT_AMBULATORY_CARE_PROVIDER_SITE_OTHER): Payer: Medicare HMO

## 2023-03-17 DIAGNOSIS — E039 Hypothyroidism, unspecified: Secondary | ICD-10-CM

## 2023-03-17 DIAGNOSIS — E559 Vitamin D deficiency, unspecified: Secondary | ICD-10-CM

## 2023-03-17 DIAGNOSIS — Z933 Colostomy status: Secondary | ICD-10-CM | POA: Diagnosis not present

## 2023-03-17 DIAGNOSIS — S31109A Unspecified open wound of abdominal wall, unspecified quadrant without penetration into peritoneal cavity, initial encounter: Secondary | ICD-10-CM | POA: Diagnosis not present

## 2023-03-17 DIAGNOSIS — K56609 Unspecified intestinal obstruction, unspecified as to partial versus complete obstruction: Secondary | ICD-10-CM | POA: Diagnosis not present

## 2023-03-17 DIAGNOSIS — T8131XD Disruption of external operation (surgical) wound, not elsewhere classified, subsequent encounter: Secondary | ICD-10-CM | POA: Diagnosis not present

## 2023-03-17 DIAGNOSIS — C189 Malignant neoplasm of colon, unspecified: Secondary | ICD-10-CM

## 2023-03-17 DIAGNOSIS — Z7189 Other specified counseling: Secondary | ICD-10-CM

## 2023-03-17 DIAGNOSIS — Z111 Encounter for screening for respiratory tuberculosis: Secondary | ICD-10-CM

## 2023-03-17 LAB — T4, FREE: Free T4: 0.8 ng/dL (ref 0.60–1.60)

## 2023-03-17 LAB — T3, FREE: T3, Free: 2.6 pg/mL (ref 2.3–4.2)

## 2023-03-17 LAB — VITAMIN D 25 HYDROXY (VIT D DEFICIENCY, FRACTURES): VITD: 18.52 ng/mL — ABNORMAL LOW (ref 30.00–100.00)

## 2023-03-17 LAB — TSH: TSH: 7.7 u[IU]/mL — ABNORMAL HIGH (ref 0.35–5.50)

## 2023-03-17 NOTE — Progress Notes (Signed)
[facility]   [phone] [phone]) [phone]   Clinic Follow up Note   Patient Care Team: Philip Aspen, Limmie Patricia, MD as PCP - General (Internal Medicine) Rollene Rotunda, MD as PCP - Cardiology (Cardiology) Berna Bue, MD as Consulting Physician (General Surgery) Malachy Mood, MD as Consulting Physician (Hematology) Pollyann Samples, NP as Nurse Practitioner (Nurse Practitioner) 03/17/2023  I connected with Casey Black on 03/17/23 at 12:00 PM EST by telephone and verified that I am speaking with the correct person using two identifiers.   I discussed the limitations, risks, security and privacy concerns of performing an evaluation and management service by telephone and the availability of in person appointments. I also discussed with the patient that there may be a patient responsible charge related to this service. The patient expressed understanding and agreed to proceed.   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  -Started first line chemo on 03/12/2019, received 5FU/leuc with first 2 cycles for large open abdominal wound after surgery. She started full dose FOLFOX and avastin with cycle 2. Due to neuropathy Oxaliplatin was stopped after 09/24/19.  --For convenience she was switched to oral chemo Xeloda 2 weeks on/1 week off and Bev every 3 weeks in 01/21/20. She is tolerating well -Her restaging CT scan from 03/11/2023 showed stable liver metastasis, no other new lesions. I personally reviewed with pt  -She is clinically doing well, will continue current therapy. -Since she  is tolerating treatment very well, I will see her every other treatment (6 week)  Assessment and Plan    Metastatic Pulmonary Cancer 62 year old with metastatic pulmonary cancer. Recent CT shows stable disease; largest lesion 6.5 cm with calcium deposits, unchanged. PET scan from June 2023 indicates ongoing activity. Tolerating current treatment well. - Continue current treatment regimen - Schedule next treatment session - Follow up in mid-January for next cycle  Metastatic Colon Cancer to Liver Metastatic colon cancer with liver involvement. Liver lesions stable; largest 6.5 cm, unchanged. No new extrahepatic lesions. - Continue current treatment regimen - Schedule next treatment session - Follow up in mid-January for next cycle  Plan -CT findings reviewed  -Continue Xeloda and bevacizumab every 3 weeks - Schedule next treatment session for December 26 or 27 - Follow up in mid-January for next cycle.         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 Bowel-gas pattern consistent with small bowel obstruction. No free air. 2. No acute chest findings.   01/23/2019 Imaging   CT AP IMPRESSION: Obstructing mid transverse colonic mass with mild regional adenopathy and hepatic metastatic disease. The mass likely extends through the serosa; no ascites or peritoneal nodularity.   01/24/2019 Surgery   Surgeon: Berna Bue MD Assistant: Mattie Marlin PA-C Procedure performed: Transverse colectomy with end colostomy, liver biopsy  Procedure classification: URGENT/EMERGENT Preop diagnosis: Obstructing, metastatic transverse colon  mass Post-op diagnosis/intraop findings: Same   01/24/2019 Pathology Results   FINAL MICROSCOPIC DIAGNOSIS:   A. COLON, TRANSVERSE, RESECTION:  Colonic adenocarcinoma, 5 cm.  Carcinoma extends into pericolonic connective tissue and focally to  serosal surface.  Margins not involved.  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   B. LIVER NODULE, LEFT, BIOPSY:  Metastatic adenocarcinoma.    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 Hypermetabolic metastatic disease in the liver and mediastinal/hilar/axillary lymph nodes. 2. Focal hypermetabolism in the rectum. Continued attention on follow-up exams is warranted. 3. Focal hypermetabolism medial to the right adrenal gland may be within a metastatic lymph node, better visualized on 01/23/2019. 4. Aortic atherosclerosis (ICD10-170.0). Coronary artery calcification.   03/12/2019 -  Chemotherapy   She started 5FU q2weeks on 03/12/19 for 2 cycles. She started full dose FOLFOX with Avastin on 04/09/19. Oxaliplatin dose reduced repeatedly due to neuropathy C12 and held since C16 on 10/09/19. Now on maintenance Avastin and 5FU q2weeks since 10/09/19       -Maintenance change to maintenance xeloda 2000 mg BID days 1-14 q21 days and q3 weeks Zirabev (15 mg/kg) starting 01/21/20. First cycle was taken 1000mg  BID due to misunderstanding.    05/31/2019 Imaging   Restaging CT CAP IMPRESSION: 1. Similar to mild interval decrease in size of multiple hepatic lesions, partially calcified. 2. 2 mm right upper lobe pulmonary nodule. Recommend attention on follow-up. 3. Emphysema and aortic atherosclerosis.   08/23/2019 Imaging   CT CAP w contrast  IMPRESSION: 1. The  dominant peripheral right liver metastasis has mildly increased. Other smaller liver metastases are stable. 2. Otherwise no new or progressive metastatic disease in the chest, abdomen or pelvis. 3. Aortic Atherosclerosis (ICD10-I70.0) and Emphysema (ICD10-J43.9).   11/27/2019 Imaging   CT CAP w contrast  IMPRESSION: Status post transverse colectomy with right mid abdominal colostomy.   Mildly progressive hepatic metastases, as above.   No evidence of metastatic disease in the chest. Small mediastinal lymph nodes are within normal limits.   Additional stable ancillary findings as above.   01/21/2020 -  Chemotherapy   Patient is on Treatment Plan : COLORECTAL Bevacizumab q21d     02/21/2020 Imaging   IMPRESSION: 1. Stable hepatic metastatic disease. 2. Aortic atherosclerosis (ICD10-I70.0). Coronary artery calcification. 3.  Emphysema (ICD10-J43.9).   05/19/2020 Imaging   CT CAP  IMPRESSION: 1. Multiple partially calcified liver metastases are again noted. With the exception of a small lesion in segment 7/8 lesions are not significantly changed in the interval. No new liver lesions identified. 2. Coronary artery atherosclerotic calcifications. 3. Aortic atherosclerosis. 4. 2 mm right upper lobe lung nodule identified.  Unchanged.   Aortic Atherosclerosis (ICD10-I70.0).   11/17/2020 Imaging   CT CAP  IMPRESSION: 1. Partially calcified lesions throughout the liver are stable accounting for differences in technique, contrasted imaging on today's study is compared to noncontrast imaging on the prior. 2. No new hepatic lesions. 3. Tiny 3 mm pulmonary nodule in the RIGHT upper lobe unchanged since the prior study. Attention on follow-up. 4. Mild fullness of RIGHT paratracheal nodal tissue is minimally increased and borderline enlarged, attention on follow-up. 5. RIGHT  lower quadrant colostomy. 6. 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 7. Aortic atherosclerosis.   06/09/2021 Imaging   EXAM: CT CHEST, ABDOMEN, AND PELVIS WITH CONTRAST  IMPRESSION: Stable calcified liver metastases.   Stable mildly enlarged right paratracheal lymph node.   No evidence of new or progressive metastatic disease.   Aortic Atherosclerosis (ICD10-I70.0).   09/14/2021 PET scan   IMPRESSION: Multiple hypermetabolic and partially calcified liver metastases, without significant change compared to most recent CT.   Mild hypermetabolic mediastinal and bilateral hilar lymphadenopathy, also without significant change.   No evidence of new or progressive metastatic disease.     Electronically Signed   By: Danae Orleans M.D.   On: 09/15/2021 11:04   04/15/2022 Imaging    IMPRESSION: 1. Several previously noted partially calcified hepatic metastases appear grossly stable compared to the prior examination. No new hepatic lesions or other signs of metastatic disease noted elsewhere in the chest, abdomen or pelvis. 2. Aortic atherosclerosis, in addition to left main and three-vessel coronary artery disease. Please note that although the presence of coronary artery calcium documents the presence of coronary artery disease, the severity of this disease and any potential stenosis cannot be assessed on this non-gated CT examination. Assessment for potential risk factor modification, dietary therapy or pharmacologic therapy may be warranted, if clinically indicated. 3. There are calcifications of the mitral annulus. Echocardiographic correlation for evaluation of potential valvular dysfunction may be warranted if clinically indicated. 4. Additional incidental findings, similar to prior studies, as above.   07/15/2022 Imaging    IMPRESSION: 1. Stable exam. No new or progressive findings in the abdomen or pelvis to suggest worsening disease. 2. No substantial  change in calcified liver metastases. 3. Right abdominal transverse end colostomy with parastomal herniation of fat and colon proximal to the stoma. 4. Probable left-sided uterine fibroid measuring on the order of 3.1 cm and displacing the endometrial stripe to the right. Pelvic ultrasound could be used to further evaluate as clinically warranted. 5.  Aortic Atherosclerosis (ICD10-I70.0).       Discussed the use of AI scribe software for clinical note transcription with the patient, who gave verbal consent to proceed.  History of Present Illness   A 62 year old patient with a known diagnosis of metastatic pulmonary cancer had a CT scan after her last visit. The patient reports no issues with the treatment she received and has been feeling fine. The patient has been on a treatment plan for her cancer and has been following up regularly. The patient has not reported any new symptoms or complications since the last visit.         REVIEW OF SYSTEMS:   Constitutional: Denies fevers, chills or abnormal weight loss Eyes: Denies blurriness of vision Ears, nose, mouth, throat, and face: Denies mucositis or sore throat Respiratory: Denies cough, dyspnea or wheezes Cardiovascular: Denies palpitation, chest discomfort or lower extremity swelling Gastrointestinal:  Denies nausea, heartburn or change in bowel habits Skin: Denies abnormal skin rashes Lymphatics: Denies new lymphadenopathy or easy bruising Neurological:Denies numbness, tingling or new weaknesses Behavioral/Psych: Mood is stable, no new changes  All other systems were reviewed with the patient and are negative.  MEDICAL HISTORY:  Past Medical History:  Diagnosis Date   Anemia    low iron   Colon cancer (HCC) 01/2019   Hypertension 01/23/2019   Personal history of chemotherapy 01/2019  colon CA   SBO (small bowel obstruction) (HCC) 01/23/2019    SURGICAL HISTORY: Past Surgical History:  Procedure Laterality Date    CESAREAN SECTION     x2   COLOSTOMY N/A 01/24/2019   Procedure: End Loop Colostomy;  Surgeon: Berna Bue, MD;  Location: [facility] OR;  Service: General;  Laterality: N/A;   PARTIAL COLECTOMY N/A 01/24/2019   Procedure: PARTIAL COLECTOMY;  Surgeon: Berna Bue, MD;  Location: [facility] OR;  Service: General;  Laterality: N/A;   PORTACATH PLACEMENT Right 02/28/2019   Procedure: INSERTION PORT-A-CATH WITH ULTRASOUND GUIDANCE;  Surgeon: Berna Bue, MD;  Location: [facility] OR;  Service: General;  Laterality: Right;    I have reviewed the social history and family history with the patient and they are unchanged from previous note.  ALLERGIES:  has no known allergies.  MEDICATIONS:  Current Outpatient Medications  Medication Sig Dispense Refill   acetaminophen (TYLENOL) 325 MG tablet Take 2 tablets (650 mg total) by mouth every 6 (six) hours as needed.     amLODipine (NORVASC) 10 MG tablet TAKE 1 TABLET BY MOUTH EVERY DAY 90 tablet 1   benzonatate (TESSALON) 100 MG capsule Take 1 capsule (100 mg total) by mouth 2 (two) times daily as needed for cough. 20 capsule 0   capecitabine (XELODA) 500 MG tablet Take 4 tablets (2,000 mg total) by mouth 2 (two) times daily after a meal. Take for 14 days, then off for 7 days. 112 tablet 2   ferrous sulfate 325 (65 FE) MG tablet TAKE 1 TABLET BY MOUTH TWICE A DAY WITH FOOD 180 tablet 1   gabapentin (NEURONTIN) 300 MG capsule Take 1 capsule (300 mg total) by mouth 2 (two) times daily. 60 capsule 3   hydrALAZINE (APRESOLINE) 25 MG tablet TAKE 1 TABLET BY MOUTH EVERY 8 HOURS 270 tablet 0   hydrochlorothiazide (HYDRODIURIL) 12.5 MG tablet Take 1 tablet (12.5 mg total) by mouth daily. 90 tablet 1   hydrocortisone (ANUSOL-HC) 2.5 % rectal cream Place 1 application rectally 2 (two) times daily. 30 g 0   Ibuprofen 200 MG CAPS Take 400 mg by mouth daily as needed (pain).     isosorbide mononitrate (IMDUR) 30 MG 24 hr tablet TAKE 1 TABLET BY MOUTH EVERY DAY 90 tablet 0    lidocaine (LMX) 4 % cream Apply 1 application topically 3 (three) times daily as needed. 30 g 0   lidocaine-prilocaine (EMLA) cream APPLY 1 APPLICATION TOPICALLY AS NEEDED. 30 g 1   lisinopril (ZESTRIL) 40 MG tablet Take 1 tablet (40 mg total) by mouth daily. 90 tablet 1   polycarbophil (FIBERCON) 625 MG tablet Take 1 tablet (625 mg total) by mouth daily. 30 tablet 0   Vitamin D, Ergocalciferol, (DRISDOL) 1.25 MG (50000 UNIT) CAPS capsule Take 1 capsule (50,000 Units total) by mouth every 7 (seven) days for 12 doses. 12 capsule 0   No current facility-administered medications for this visit.   Facility-Administered Medications Ordered in Other Visits  Medication Dose Route Frequency Provider Last Rate Last Admin   sodium chloride flush (NS) 0.9 % injection 10 mL  10 mL Intracatheter PRN Malachy Mood, MD   10 mL at 06/06/20 1051    PHYSICAL EXAMINATION: Not performed   LABORATORY DATA:  I have reviewed the data as listed    Latest Ref Rng & Units 03/09/2023    8:53 AM 02/09/2023    8:31 AM 02/01/2023    8:47 AM  CBC  WBC 4.0 -  10.5 K/uL 7.4  8.0  6.6   Hemoglobin 12.0 - 15.0 g/dL 40.9  81.1  91.4   Hematocrit 36.0 - 46.0 % 36.4  36.4  39.3   Platelets 150 - 400 K/uL 173  196  179.0         Latest Ref Rng & Units 03/09/2023    8:53 AM 02/09/2023    8:31 AM 02/01/2023    8:47 AM  CMP  Glucose 70 - 99 mg/dL 98  90  73   BUN 8 - 23 mg/dL 24  26  22    Creatinine 0.44 - 1.00 mg/dL 7.82  9.56  2.13   Sodium 135 - 145 mmol/L 137  137  137   Potassium 3.5 - 5.1 mmol/L 4.6  4.5  4.3   Chloride 98 - 111 mmol/L 111  110  109   CO2 22 - 32 mmol/L 22  20  21    Calcium 8.9 - 10.3 mg/dL 9.7  9.6  08.6   Total Protein 6.5 - 8.1 g/dL 7.4  8.1  8.4   Total Bilirubin <1.2 mg/dL 0.4  0.6  0.5   Alkaline Phos 38 - 126 U/L 83  85  85   AST 15 - 41 U/L 18  18  20    ALT 0 - 44 U/L 10  11  11        RADIOGRAPHIC STUDIES: I have personally reviewed the radiological images as listed and agreed  with the findings in the report. No results found.     I discussed the assessment and treatment plan with the patient. The patient was provided an opportunity to ask questions and all were answered. The patient agreed with the plan and demonstrated an understanding of the instructions.   The patient was advised to call back or seek an in-person evaluation if the symptoms worsen or if the condition fails to improve as anticipated.  I provided 12 minutes of non face-to-face telephone visit time during this encounter, and > 50% was spent counseling as documented under my assessment & plan.     Malachy Mood, MD 03/17/23

## 2023-03-19 ENCOUNTER — Other Ambulatory Visit: Payer: Self-pay

## 2023-03-20 LAB — QUANTIFERON-TB GOLD PLUS
Mitogen-NIL: >10 [IU]/mL
NIL: 0.09 [IU]/mL
QuantiFERON-TB Gold Plus: NEGATIVE
TB1-NIL: 0.01 [IU]/mL
TB2-NIL: 0.01 [IU]/mL

## 2023-03-24 ENCOUNTER — Other Ambulatory Visit: Payer: Self-pay | Admitting: Internal Medicine

## 2023-03-24 ENCOUNTER — Encounter: Payer: Self-pay | Admitting: Internal Medicine

## 2023-03-24 DIAGNOSIS — E039 Hypothyroidism, unspecified: Secondary | ICD-10-CM

## 2023-03-24 DIAGNOSIS — E559 Vitamin D deficiency, unspecified: Secondary | ICD-10-CM

## 2023-03-24 MED ORDER — VITAMIN D (ERGOCALCIFEROL) 1.25 MG (50000 UNIT) PO CAPS: 50000.0000 [IU] | ORAL_CAPSULE | ORAL | 0 refills | Status: AC

## 2023-03-24 MED ORDER — LEVOTHYROXINE SODIUM 25 MCG PO TABS: 25.0000 ug | ORAL_TABLET | Freq: Every day | ORAL | 1 refills | Status: DC

## 2023-03-31 ENCOUNTER — Inpatient Hospital Stay: Payer: Medicare HMO

## 2023-03-31 VITALS — BP 137/60 | HR 72 | Temp 98.4°F | Resp 18 | Wt 244.8 lb

## 2023-03-31 DIAGNOSIS — C787 Secondary malignant neoplasm of liver and intrahepatic bile duct: Secondary | ICD-10-CM

## 2023-03-31 DIAGNOSIS — Z5112 Encounter for antineoplastic immunotherapy: Secondary | ICD-10-CM | POA: Diagnosis not present

## 2023-03-31 DIAGNOSIS — Z95828 Presence of other vascular implants and grafts: Secondary | ICD-10-CM

## 2023-03-31 DIAGNOSIS — C184 Malignant neoplasm of transverse colon: Secondary | ICD-10-CM | POA: Diagnosis not present

## 2023-03-31 DIAGNOSIS — C189 Malignant neoplasm of colon, unspecified: Secondary | ICD-10-CM

## 2023-03-31 DIAGNOSIS — Z7189 Other specified counseling: Secondary | ICD-10-CM

## 2023-03-31 DIAGNOSIS — Z79899 Other long term (current) drug therapy: Secondary | ICD-10-CM | POA: Diagnosis not present

## 2023-03-31 DIAGNOSIS — C779 Secondary and unspecified malignant neoplasm of lymph node, unspecified: Secondary | ICD-10-CM | POA: Diagnosis not present

## 2023-03-31 LAB — CBC WITH DIFFERENTIAL (CANCER CENTER ONLY)
Abs Immature Granulocytes: 0.02 10*3/uL (ref 0.00–0.07)
Basophils Absolute: 0.1 10*3/uL (ref 0.0–0.1)
Basophils Relative: 1 %
Eosinophils Absolute: 0.4 10*3/uL (ref 0.0–0.5)
Eosinophils Relative: 4 %
HCT: 34.5 % — ABNORMAL LOW (ref 36.0–46.0)
Hemoglobin: 11.2 g/dL — ABNORMAL LOW (ref 12.0–15.0)
Immature Granulocytes: 0 %
Lymphocytes Relative: 22 %
Lymphs Abs: 1.9 10*3/uL (ref 0.7–4.0)
MCH: 32.1 pg (ref 26.0–34.0)
MCHC: 32.5 g/dL (ref 30.0–36.0)
MCV: 98.9 fL (ref 80.0–100.0)
Monocytes Absolute: 0.7 10*3/uL (ref 0.1–1.0)
Monocytes Relative: 8 %
Neutro Abs: 5.7 10*3/uL (ref 1.7–7.7)
Neutrophils Relative %: 65 %
Platelet Count: 183 10*3/uL (ref 150–400)
RBC: 3.49 MIL/uL — ABNORMAL LOW (ref 3.87–5.11)
RDW: 15.5 % (ref 11.5–15.5)
WBC Count: 8.7 10*3/uL (ref 4.0–10.5)
nRBC: 0 % (ref 0.0–0.2)

## 2023-03-31 LAB — COMPREHENSIVE METABOLIC PANEL
ALT: 12 U/L (ref 0–44)
AST: 20 U/L (ref 15–41)
Albumin: 3.8 g/dL (ref 3.5–5.0)
Alkaline Phosphatase: 79 U/L (ref 38–126)
Anion gap: 5 (ref 5–15)
BUN: 34 mg/dL — ABNORMAL HIGH (ref 8–23)
CO2: 20 mmol/L — ABNORMAL LOW (ref 22–32)
Calcium: 9.4 mg/dL (ref 8.9–10.3)
Chloride: 112 mmol/L — ABNORMAL HIGH (ref 98–111)
Creatinine, Ser: 1.21 mg/dL — ABNORMAL HIGH (ref 0.44–1.00)
GFR, Estimated: 50 mL/min — ABNORMAL LOW (ref >60)
Glucose, Bld: 88 mg/dL (ref 70–99)
Potassium: 4.4 mmol/L (ref 3.5–5.1)
Sodium: 137 mmol/L (ref 135–145)
Total Bilirubin: 0.5 mg/dL (ref <1.2)
Total Protein: 7.8 g/dL (ref 6.5–8.1)

## 2023-03-31 LAB — TOTAL PROTEIN, URINE DIPSTICK: Protein, ur: NEGATIVE mg/dL

## 2023-03-31 MED ORDER — SODIUM CHLORIDE 0.9% FLUSH
10.0000 mL | INTRAVENOUS | Status: DC | PRN
Start: 2023-03-31 — End: 2023-03-31
  Administered 2023-03-31: 10 mL

## 2023-03-31 MED ORDER — HEPARIN SOD (PORK) LOCK FLUSH 100 UNIT/ML IV SOLN
500.0000 [IU] | Freq: Once | INTRAVENOUS | Status: AC | PRN
  Administered 2023-03-31: 500 [IU]

## 2023-03-31 MED ORDER — SODIUM CHLORIDE 0.9 % IV SOLN: Freq: Once | INTRAVENOUS | Status: AC

## 2023-03-31 MED ORDER — SODIUM CHLORIDE 0.9% FLUSH
10.0000 mL | Freq: Once | INTRAVENOUS | Status: AC
  Administered 2023-03-31: 10 mL

## 2023-03-31 MED ORDER — BEVACIZUMAB-ADCD CHEMO INJECTION 400 MG/16ML
7.5000 mg/kg | Freq: Once | INTRAVENOUS | Status: AC
  Administered 2023-03-31: 800 mg via INTRAVENOUS
  Filled 2023-03-31: qty 32

## 2023-03-31 NOTE — Patient Instructions (Signed)
 [facility] - A DEPT OF [facility]  Discharge Instructions: Thank you for choosing [facility] to provide your oncology and hematology care.   If you have a lab appointment with the Cancer Center, please go directly to the Cancer Center and check in at the registration area.   Wear comfortable clothing and clothing appropriate for easy access to any Portacath or PICC line.   We strive to give you quality time with your provider. You may need to reschedule your appointment if you arrive late (15 or more minutes).  Arriving late affects you and other patients whose appointments are after yours.  Also, if you miss three or more appointments without notifying the office, you may be dismissed from the clinic at the provider's discretion.      For prescription refill requests, have your pharmacy contact our office and allow 72 hours for refills to be completed.    Today you received the following chemotherapy and/or immunotherapy agents: Vegzelma      To help prevent nausea and vomiting after your treatment, we encourage you to take your nausea medication as directed.  BELOW ARE SYMPTOMS THAT SHOULD BE REPORTED IMMEDIATELY: *FEVER GREATER THAN 100.4 F (38 C) OR HIGHER *CHILLS OR SWEATING *NAUSEA AND VOMITING THAT IS NOT CONTROLLED WITH YOUR NAUSEA MEDICATION *UNUSUAL SHORTNESS OF BREATH *UNUSUAL BRUISING OR BLEEDING *URINARY PROBLEMS (pain or burning when urinating, or frequent urination) *BOWEL PROBLEMS (unusual diarrhea, constipation, pain near the anus) TENDERNESS IN MOUTH AND THROAT WITH OR WITHOUT PRESENCE OF ULCERS (sore throat, sores in mouth, or a toothache) UNUSUAL RASH, SWELLING OR PAIN  UNUSUAL VAGINAL DISCHARGE OR ITCHING   Items with * indicate a potential emergency and should be followed up as soon as possible or go to the Emergency Department if any problems should occur.  Please show the CHEMOTHERAPY ALERT CARD or IMMUNOTHERAPY  ALERT CARD at check-in to the Emergency Department and triage nurse.  Should you have questions after your visit or need to cancel or reschedule your appointment, please contact [facility] - A DEPT OF [facility]  Dept: [phone]  and follow the prompts.  Office hours are 8:00 a.m. to 4:30 p.m. Monday - Friday. Please note that voicemails left after 4:00 p.m. may not be returned until the following business day.  We are closed weekends and major holidays. You have access to a nurse at all times for urgent questions. Please call the main number to the clinic Dept: [phone] and follow the prompts.   For any non-urgent questions, you may also contact your provider using MyChart. We now offer e-Visits for anyone 10 and older to request care online for non-urgent symptoms. For details visit [email or web address].   Also download the MyChart app! Go to the app store, search "MyChart", open the app, select [place], and log in with your MyChart username and password.

## 2023-04-03 ENCOUNTER — Other Ambulatory Visit: Payer: Self-pay | Admitting: Internal Medicine

## 2023-04-03 DIAGNOSIS — Z0000 Encounter for general adult medical examination without abnormal findings: Secondary | ICD-10-CM

## 2023-04-18 NOTE — Progress Notes (Signed)
 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  -Started first line chemo on 03/12/2019, received 5FU/leuc with first 2 cycles for large open abdominal wound after surgery. She started full dose FOLFOX and avastin  with cycle 2. Due to neuropathy Oxaliplatin  was stopped after 09/24/19.  --For convenience she was switched to oral chemo Xeloda  2 weeks on/1 week off and Bev every 3 weeks in 01/21/20. She is tolerating well -Her restaging CT scan from 03/11/2023 showed stable liver metastasis, no other new lesions.-She is clinically doing well, will continue current therapy. -Since she is tolerating treatment very well, she can be seen every other treatment (6 week)   Plan: Labs reviewed  -CBC showing WBC 7.7; Hgb 11.4; Hct 33.3; Plt 177; Anc 5.0 -CEA reduced at 130.42 -Urine is negative for protein. Patient condition and has satisfactory for treatment. Proceed with cycle 25 day 1 of bevacizumab . Continue Xeloda  500 mg, taking 4 tablets in the morning and 4 tablets in the evening for 14 days followed by 7-day break. Labs/flush, follow-up, and treatment in 3 weeks as scheduled.   The patient understands the plans discussed today and is in agreement with them.  She knows to contact our office if she develops concerns prior to her next appointment.  I provided 25  minutes of face-to-face time during this encounter and > 50% was spent counseling as documented under my assessment and plan.    Sharyon Deis, NP  [facility] - A DEPT OF [facility] [address] [place] [address] Dept: [phone] Dept Fax: [phone]   No orders of the defined types were placed in this encounter.     CHIEF COMPLAINT:  CC: Adenocarcinoma of the colon, metastases to the liver  Current Treatment: Oral chemo Xeloda  and bevacizumab  every 3 weeks  INTERVAL HISTORY:  Casey Black is here today for repeat clinical assessment.  She last had a phone visit with Dr. Maryalice Smaller on 03/16/2023.  Has been tolerating therapy well.  She reports nausea for few days after getting bevacizumab .  Today with vomiting.  Catheter 1 to 2 days and then returns to baseline.  She denies belly pain, constipation.  Intermittently, she has loose stool.  She also reports residual neuropathy for which she takes gabapentin .  This is unchanged and tolerable.denies chest pain, She chest pressure, or shortness of breath. She denies headaches or visual disturbances. She denies fevers or chills. She denies pain. Her appetite is good. Her weight has decreased 2 pounds over last 3 weeks .  I have reviewed the past medical history, past surgical history, social history and family history with the patient and they are unchanged from previous note.  ALLERGIES:  has no known allergies.  MEDICATIONS:  Current Outpatient Medications  Medication Sig Dispense Refill   acetaminophen  (TYLENOL ) 325 MG tablet Take 2 tablets (650 mg total) by mouth every 6 (six) hours  as needed.     amLODipine  (NORVASC ) 10 MG tablet TAKE 1 TABLET BY MOUTH EVERY DAY 90 tablet 1   benzonatate  (TESSALON ) 100 MG capsule Take 1 capsule (100 mg total) by mouth 2 (two) times daily as needed for cough. 20 capsule 0   capecitabine  (XELODA ) 500 MG tablet Take 4 tablets (2,000 mg  total) by mouth 2 (two) times daily after a meal. Take for 14 days, then off for 7 days. 112 tablet 2   ferrous sulfate  325 (65 FE) MG tablet TAKE 1 TABLET BY MOUTH TWICE A DAY WITH FOOD 180 tablet 1   gabapentin  (NEURONTIN ) 300 MG capsule Take 1 capsule (300 mg total) by mouth 2 (two) times daily. 60 capsule 3   hydrALAZINE  (APRESOLINE ) 25 MG tablet TAKE 1 TABLET BY MOUTH EVERY 8 HOURS 270 tablet 0   hydrochlorothiazide  (HYDRODIURIL ) 12.5 MG tablet Take 1 tablet (12.5 mg total) by mouth daily. 90 tablet 1   hydrocortisone  (ANUSOL -HC) 2.5 % rectal cream Place 1 application rectally 2 (two) times daily. 30 g 0   Ibuprofen  200 MG CAPS Take 400 mg by mouth daily as needed (pain).     isosorbide  mononitrate (IMDUR ) 30 MG 24 hr tablet TAKE 1 TABLET BY MOUTH EVERY DAY 90 tablet 1   levothyroxine  (SYNTHROID ) 25 MCG tablet Take 1 tablet (25 mcg total) by mouth daily. 90 tablet 1   lidocaine  (LMX) 4 % cream Apply 1 application topically 3 (three) times daily as needed. 30 g 0   lidocaine -prilocaine  (EMLA ) cream APPLY 1 APPLICATION TOPICALLY AS NEEDED. 30 g 1   lisinopril  (ZESTRIL ) 40 MG tablet Take 1 tablet (40 mg total) by mouth daily. 90 tablet 1   polycarbophil (FIBERCON) 625 MG tablet Take 1 tablet (625 mg total) by mouth daily. 30 tablet 0   Vitamin D , Ergocalciferol , (DRISDOL ) 1.25 MG (50000 UNIT) CAPS capsule Take 1 capsule (50,000 Units total) by mouth every 7 (seven) days for 12 doses. 12 capsule 0   ondansetron  (ZOFRAN ) 8 MG tablet Take 1 tablet (8 mg total) by mouth every 8 (eight) hours as needed for nausea or vomiting. 20 tablet 2   prochlorperazine  (COMPAZINE ) 10 MG tablet Take 1 tablet (10 mg total) by mouth every 6 (six) hours as needed for nausea or vomiting. 30 tablet 2   No current facility-administered medications for this visit.   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 Bowel-gas pattern consistent with small bowel obstruction. No free air. 2. No acute chest findings.   01/23/2019 Imaging   CT AP IMPRESSION: Obstructing mid transverse colonic mass with mild regional adenopathy and hepatic metastatic disease. The mass likely extends through the serosa; no ascites or peritoneal nodularity.   01/24/2019 Surgery   Surgeon: Adalberto Acton MD Assistant: Fairy Homer PA-C Procedure performed: Transverse colectomy with end colostomy, liver biopsy Procedure classification: URGENT/EMERGENT Preop diagnosis: Obstructing, metastatic transverse  colon mass Post-op diagnosis/intraop findings: Same   01/24/2019 Pathology Results   FINAL MICROSCOPIC DIAGNOSIS:   A. COLON, TRANSVERSE, RESECTION:  Colonic adenocarcinoma, 5 cm.  Carcinoma extends into pericolonic connective tissue and focally to  serosal surface.  Margins not involved.  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   B. LIVER NODULE, LEFT, BIOPSY:  Metastatic adenocarcinoma.    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 Hypermetabolic metastatic disease in the liver and mediastinal/hilar/axillary lymph nodes. 2. Focal hypermetabolism in the  rectum. Continued attention on follow-up exams is warranted. 3. Focal hypermetabolism medial to the right adrenal gland may be within a metastatic lymph node, better visualized on 01/23/2019. 4. Aortic atherosclerosis (ICD10-170.0). Coronary artery calcification.   03/12/2019 -  Chemotherapy   She started 5FU q2weeks on 03/12/19 for 2 cycles. She started full dose FOLFOX with Avastin  on 04/09/19. Oxaliplatin  dose reduced repeatedly due to neuropathy C12 and held since C16 on 10/09/19. Now on maintenance Avastin  and 5FU q2weeks since 10/09/19       -Maintenance change to maintenance xeloda  2000 mg BID days 1-14 q21 days and q3 weeks Zirabev  (15 mg/kg) starting 01/21/20. First cycle was taken 1000mg  BID due to misunderstanding.    05/31/2019 Imaging   Restaging CT CAP IMPRESSION: 1. Similar to mild interval decrease in size of multiple hepatic lesions, partially calcified. 2. 2 mm right upper lobe pulmonary nodule. Recommend attention on follow-up. 3. Emphysema and aortic atherosclerosis.   08/23/2019 Imaging   CT CAP w contrast  IMPRESSION: 1. The dominant peripheral right liver metastasis has mildly increased. Other smaller liver metastases are stable. 2. Otherwise no new or progressive metastatic disease in the chest, abdomen or pelvis. 3. Aortic Atherosclerosis (ICD10-I70.0) and Emphysema (ICD10-J43.9).   11/27/2019 Imaging   CT CAP w contrast  IMPRESSION: Status post transverse colectomy with right mid abdominal colostomy.   Mildly progressive hepatic metastases, as above.   No evidence of metastatic disease in the chest. Small mediastinal lymph nodes are within normal limits.   Additional stable ancillary findings as above.   01/21/2020 -  Chemotherapy   Patient is on Treatment Plan : COLORECTAL Bevacizumab  q21d     02/21/2020 Imaging   IMPRESSION: 1. Stable hepatic metastatic disease. 2. Aortic atherosclerosis (ICD10-I70.0). Coronary artery calcification. 3.  Emphysema  (ICD10-J43.9).   05/19/2020 Imaging   CT CAP  IMPRESSION: 1. Multiple partially calcified liver metastases are again noted. With the exception of a small lesion in segment 7/8 lesions are not significantly changed in the interval. No new liver lesions identified. 2. Coronary artery atherosclerotic calcifications. 3. Aortic atherosclerosis. 4. 2 mm right upper lobe lung nodule identified.  Unchanged.   Aortic Atherosclerosis (ICD10-I70.0).   11/17/2020 Imaging   CT CAP  IMPRESSION: 1. Partially calcified lesions throughout the liver are stable accounting for differences in technique, contrasted imaging on today's study is compared to noncontrast imaging on the prior. 2. No new hepatic lesions. 3. Tiny 3 mm pulmonary nodule in the RIGHT upper lobe unchanged since the prior study. Attention on follow-up. 4. Mild fullness of RIGHT paratracheal nodal tissue is minimally increased and borderline enlarged, attention on follow-up. 5. RIGHT lower quadrant colostomy. 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 7. Aortic atherosclerosis.   06/09/2021 Imaging   EXAM: CT CHEST, ABDOMEN, AND PELVIS WITH CONTRAST  IMPRESSION: Stable calcified liver metastases.   Stable mildly enlarged right paratracheal lymph node.   No evidence of new or progressive metastatic disease.   Aortic Atherosclerosis (ICD10-I70.0).   09/14/2021 PET scan   IMPRESSION: Multiple hypermetabolic and partially calcified liver metastases, without significant change compared to most recent CT.   Mild hypermetabolic mediastinal and bilateral hilar lymphadenopathy, also without significant change.   No evidence of new or progressive metastatic disease.     Electronically Signed   By: Marlyce Sine M.D.   On: 09/15/2021 11:04   04/15/2022 Imaging    IMPRESSION: 1. Several previously noted  partially calcified hepatic metastases appear grossly stable compared to the prior examination. No new hepatic lesions or other signs of metastatic disease noted elsewhere in the chest, abdomen or pelvis. 2. Aortic atherosclerosis, in addition to left main and three-vessel coronary artery disease. Please note that although the presence of coronary artery calcium  documents the presence of coronary artery disease, the severity of this disease and any potential stenosis cannot be assessed on this non-gated CT examination. Assessment for potential risk factor modification, dietary therapy or pharmacologic therapy may be warranted, if clinically indicated. 3. There are calcifications of the mitral annulus. Echocardiographic correlation for evaluation of potential valvular dysfunction may be warranted if clinically indicated. 4. Additional incidental findings, similar to prior studies, as above.   07/15/2022 Imaging    IMPRESSION: 1. Stable exam. No new or progressive findings in the abdomen or pelvis to suggest worsening disease. 2. No substantial change in calcified liver metastases. 3. Right abdominal transverse end colostomy with parastomal herniation of fat and colon proximal to the stoma. 4. Probable left-sided uterine fibroid measuring on the order of 3.1 cm and displacing the endometrial stripe to the right. Pelvic ultrasound could be used to further evaluate as clinically warranted. 5.  Aortic Atherosclerosis (ICD10-I70.0).         REVIEW OF SYSTEMS:   Constitutional: Denies fevers, chills or abnormal weight loss Eyes: Denies blurriness of vision Ears, nose, mouth, throat, and face: Denies mucositis or sore throat Respiratory: Denies cough, dyspnea or wheezes Cardiovascular: Denies palpitation, chest discomfort or lower extremity swelling Gastrointestinal:  Denies nausea, heartburn or change in bowel habits Skin: Denies abnormal skin rashes Lymphatics: Denies new  lymphadenopathy or easy bruising Neurological:Denies numbness, tingling or new weaknesses Behavioral/Psych: Mood is stable, no new changes  All other systems were reviewed with the patient and are negative.   VITALS:   Today's Vitals   04/20/23 1018 04/20/23 1019  BP: (!) 136/58   Pulse: 96   Resp: 16   Temp: (!) 97.5 F (36.4 C)   TempSrc: Temporal   SpO2: 100%   Weight: 242 lb 14.4 oz (110.2 kg)   PainSc:  0-No pain   Body mass index is 36.93 kg/m.   Wt Readings from Last 3 Encounters:  04/20/23 242 lb 14.4 oz (110.2 kg)  03/31/23 244 lb 12 oz (111 kg)  03/09/23 247 lb 3.2 oz (112.1 kg)    Body mass index is 36.93 kg/m.  Performance status (ECOG): 1 - Symptomatic but completely ambulatory  PHYSICAL EXAM:   GENERAL:alert, no distress and comfortable SKIN: skin color, texture, turgor are normal, no rashes or significant lesions EYES: normal, Conjunctiva are pink and  non-injected, sclera clear OROPHARYNX:no exudate, no erythema and lips, buccal mucosa, and tongue normal  NECK: supple, thyroid  normal size, non-tender, without nodularity LYMPH:  no palpable lymphadenopathy in the cervical, axillary or inguinal LUNGS: clear to auscultation and percussion with normal breathing effort HEART: regular rate & rhythm and no murmurs and no lower extremity edema ABDOMEN:abdomen soft, non-tender and normal bowel sounds Musculoskeletal:no cyanosis of digits and no clubbing  NEURO: alert & oriented x 3 with fluent speech, no focal motor/sensory deficits  LABORATORY DATA:  I have reviewed the data as listed    Component Value Date/Time   NA 137 03/31/2023 0930   K 4.4 03/31/2023 0930   CL 112 (H) 03/31/2023 0930   CO2 20 (L) 03/31/2023 0930   GLUCOSE 88 03/31/2023 0930   BUN 34 (H) 03/31/2023 0930   CREATININE 1.21 (H) 03/31/2023 0930   CREATININE 1.13 (H) 01/19/2023 0825   CALCIUM  9.4 03/31/2023 0930   PROT 7.8 03/31/2023 0930   ALBUMIN 3.8 03/31/2023 0930   AST 20  03/31/2023 0930   AST 19 01/19/2023 0825   ALT 12 03/31/2023 0930   ALT 15 01/19/2023 0825   ALKPHOS 79 03/31/2023 0930   BILITOT 0.5 03/31/2023 0930   BILITOT 0.7 01/19/2023 0825   GFRNONAA 50 (L) 03/31/2023 0930   GFRNONAA 55 (L) 01/19/2023 0825   GFRAA >60 01/07/2020 0957    Lab Results  Component Value Date   WBC 7.7 04/20/2023   NEUTROABS 5.0 04/20/2023   HGB 11.4 (L) 04/20/2023   HCT 35.3 (L) 04/20/2023   MCV 97.8 04/20/2023   PLT 177 04/20/2023

## 2023-04-18 NOTE — Assessment & Plan Note (Addendum)
 [id number] stage IV with liver and nodal metastasis, MSS, KRAS G12S(+)  -Diagnosed in 01/2019 after emergent colectomy and liver biopsy. 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  -Started first line chemo on 03/12/2019, received 5FU/leuc with first 2 cycles for large open abdominal wound after surgery. She started full dose FOLFOX and avastin  with cycle 2. Due to neuropathy Oxaliplatin  was stopped after 09/24/19.  --For convenience she was switched to oral chemo Xeloda  2 weeks on/1 week off and Bev every 3 weeks in 01/21/20. She is tolerating well -Her restaging CT scan from 03/11/2023 showed stable liver metastasis, no other new lesions.-She is clinically doing well, will continue current therapy. -Since she is tolerating treatment very well, she can be seen every other treatment (6 week)

## 2023-04-19 ENCOUNTER — Other Ambulatory Visit: Payer: Self-pay

## 2023-04-19 DIAGNOSIS — Z933 Colostomy status: Secondary | ICD-10-CM | POA: Diagnosis not present

## 2023-04-19 DIAGNOSIS — S31109A Unspecified open wound of abdominal wall, unspecified quadrant without penetration into peritoneal cavity, initial encounter: Secondary | ICD-10-CM | POA: Diagnosis not present

## 2023-04-19 DIAGNOSIS — T8131XD Disruption of external operation (surgical) wound, not elsewhere classified, subsequent encounter: Secondary | ICD-10-CM | POA: Diagnosis not present

## 2023-04-19 DIAGNOSIS — K56609 Unspecified intestinal obstruction, unspecified as to partial versus complete obstruction: Secondary | ICD-10-CM | POA: Diagnosis not present

## 2023-04-20 ENCOUNTER — Inpatient Hospital Stay (HOSPITAL_BASED_OUTPATIENT_CLINIC_OR_DEPARTMENT_OTHER): Payer: Medicare HMO | Admitting: Nurse Practitioner

## 2023-04-20 ENCOUNTER — Other Ambulatory Visit: Payer: Self-pay

## 2023-04-20 ENCOUNTER — Inpatient Hospital Stay: Payer: Medicare HMO

## 2023-04-20 ENCOUNTER — Inpatient Hospital Stay: Payer: Medicare HMO | Attending: Hematology

## 2023-04-20 VITALS — BP 136/58 | HR 96 | Temp 97.5°F | Resp 16 | Wt 242.9 lb

## 2023-04-20 DIAGNOSIS — Z7189 Other specified counseling: Secondary | ICD-10-CM

## 2023-04-20 DIAGNOSIS — Z5112 Encounter for antineoplastic immunotherapy: Secondary | ICD-10-CM | POA: Insufficient documentation

## 2023-04-20 DIAGNOSIS — Z95828 Presence of other vascular implants and grafts: Secondary | ICD-10-CM

## 2023-04-20 DIAGNOSIS — C189 Malignant neoplasm of colon, unspecified: Secondary | ICD-10-CM

## 2023-04-20 DIAGNOSIS — C184 Malignant neoplasm of transverse colon: Secondary | ICD-10-CM | POA: Diagnosis not present

## 2023-04-20 DIAGNOSIS — C787 Secondary malignant neoplasm of liver and intrahepatic bile duct: Secondary | ICD-10-CM

## 2023-04-20 DIAGNOSIS — Z79899 Other long term (current) drug therapy: Secondary | ICD-10-CM | POA: Insufficient documentation

## 2023-04-20 LAB — CBC WITH DIFFERENTIAL (CANCER CENTER ONLY)
Abs Immature Granulocytes: 0.03 10*3/uL (ref 0.00–0.07)
Basophils Absolute: 0 10*3/uL (ref 0.0–0.1)
Basophils Relative: 0 %
Eosinophils Absolute: 0.2 10*3/uL (ref 0.0–0.5)
Eosinophils Relative: 3 %
HCT: 35.3 % — ABNORMAL LOW (ref 36.0–46.0)
Hemoglobin: 11.4 g/dL — ABNORMAL LOW (ref 12.0–15.0)
Immature Granulocytes: 0 %
Lymphocytes Relative: 25 %
Lymphs Abs: 1.9 10*3/uL (ref 0.7–4.0)
MCH: 31.6 pg (ref 26.0–34.0)
MCHC: 32.3 g/dL (ref 30.0–36.0)
MCV: 97.8 fL (ref 80.0–100.0)
Monocytes Absolute: 0.5 10*3/uL (ref 0.1–1.0)
Monocytes Relative: 7 %
Neutro Abs: 5 10*3/uL (ref 1.7–7.7)
Neutrophils Relative %: 65 %
Platelet Count: 177 10*3/uL (ref 150–400)
RBC: 3.61 MIL/uL — ABNORMAL LOW (ref 3.87–5.11)
RDW: 16 % — ABNORMAL HIGH (ref 11.5–15.5)
WBC Count: 7.7 10*3/uL (ref 4.0–10.5)
nRBC: 0 % (ref 0.0–0.2)

## 2023-04-20 LAB — TOTAL PROTEIN, URINE DIPSTICK: Protein, ur: NEGATIVE mg/dL

## 2023-04-20 LAB — CEA (ACCESS): CEA (CHCC): 130.42 ng/mL — ABNORMAL HIGH (ref 0.00–5.00)

## 2023-04-20 MED ORDER — SODIUM CHLORIDE 0.9% FLUSH
10.0000 mL | Freq: Once | INTRAVENOUS | Status: AC
  Administered 2023-04-20: 10 mL

## 2023-04-20 MED ORDER — SODIUM CHLORIDE 0.9 % IV SOLN: Freq: Once | INTRAVENOUS | Status: AC

## 2023-04-20 MED ORDER — SODIUM CHLORIDE 0.9 % IV SOLN
7.5000 mg/kg | Freq: Once | INTRAVENOUS | Status: AC
  Administered 2023-04-20: 800 mg via INTRAVENOUS
  Filled 2023-04-20: qty 32

## 2023-04-20 MED ORDER — PROCHLORPERAZINE MALEATE 10 MG PO TABS: 10.0000 mg | ORAL_TABLET | Freq: Four times a day (QID) | ORAL | 2 refills | Status: DC | PRN

## 2023-04-20 MED ORDER — ONDANSETRON HCL 8 MG PO TABS: 8.0000 mg | ORAL_TABLET | Freq: Three times a day (TID) | ORAL | 2 refills | Status: DC | PRN

## 2023-04-20 NOTE — Patient Instructions (Signed)
 [facility] - A DEPT OF [facility]  Discharge Instructions: Thank you for choosing [facility] to provide your oncology and hematology care.   If you have a lab appointment with the Cancer Center, please go directly to the Cancer Center and check in at the registration area.   Wear comfortable clothing and clothing appropriate for easy access to any Portacath or PICC line.   We strive to give you quality time with your provider. You may need to reschedule your appointment if you arrive late (15 or more minutes).  Arriving late affects you and other patients whose appointments are after yours.  Also, if you miss three or more appointments without notifying the office, you may be dismissed from the clinic at the provider's discretion.      For prescription refill requests, have your pharmacy contact our office and allow 72 hours for refills to be completed.    Today you received the following chemotherapy and/or immunotherapy agents: Vegzelma      To help prevent nausea and vomiting after your treatment, we encourage you to take your nausea medication as directed.  BELOW ARE SYMPTOMS THAT SHOULD BE REPORTED IMMEDIATELY: *FEVER GREATER THAN 100.4 F (38 C) OR HIGHER *CHILLS OR SWEATING *NAUSEA AND VOMITING THAT IS NOT CONTROLLED WITH YOUR NAUSEA MEDICATION *UNUSUAL SHORTNESS OF BREATH *UNUSUAL BRUISING OR BLEEDING *URINARY PROBLEMS (pain or burning when urinating, or frequent urination) *BOWEL PROBLEMS (unusual diarrhea, constipation, pain near the anus) TENDERNESS IN MOUTH AND THROAT WITH OR WITHOUT PRESENCE OF ULCERS (sore throat, sores in mouth, or a toothache) UNUSUAL RASH, SWELLING OR PAIN  UNUSUAL VAGINAL DISCHARGE OR ITCHING   Items with * indicate a potential emergency and should be followed up as soon as possible or go to the Emergency Department if any problems should occur.  Please show the CHEMOTHERAPY ALERT CARD or IMMUNOTHERAPY  ALERT CARD at check-in to the Emergency Department and triage nurse.  Should you have questions after your visit or need to cancel or reschedule your appointment, please contact [facility] - A DEPT OF [facility]  Dept: [phone]  and follow the prompts.  Office hours are 8:00 a.m. to 4:30 p.m. Monday - Friday. Please note that voicemails left after 4:00 p.m. may not be returned until the following business day.  We are closed weekends and major holidays. You have access to a nurse at all times for urgent questions. Please call the main number to the clinic Dept: [phone] and follow the prompts.   For any non-urgent questions, you may also contact your provider using MyChart. We now offer e-Visits for anyone 30 and older to request care online for non-urgent symptoms. For details visit [email or web address].   Also download the MyChart app! Go to the app store, search "MyChart", open the app, select [place], and log in with your MyChart username and password.

## 2023-04-22 ENCOUNTER — Other Ambulatory Visit: Payer: Self-pay | Admitting: Internal Medicine

## 2023-04-22 DIAGNOSIS — I10 Essential (primary) hypertension: Secondary | ICD-10-CM

## 2023-04-24 ENCOUNTER — Encounter: Payer: Self-pay | Admitting: Nurse Practitioner

## 2023-04-24 ENCOUNTER — Encounter: Payer: Self-pay | Admitting: Hematology

## 2023-04-24 ENCOUNTER — Other Ambulatory Visit: Payer: Self-pay | Admitting: Internal Medicine

## 2023-04-24 DIAGNOSIS — E559 Vitamin D deficiency, unspecified: Secondary | ICD-10-CM

## 2023-05-04 ENCOUNTER — Other Ambulatory Visit: Payer: Self-pay

## 2023-05-07 ENCOUNTER — Other Ambulatory Visit: Payer: Self-pay

## 2023-05-11 ENCOUNTER — Ambulatory Visit: Payer: Medicare HMO

## 2023-05-11 ENCOUNTER — Other Ambulatory Visit: Payer: Medicare HMO

## 2023-05-12 ENCOUNTER — Inpatient Hospital Stay: Payer: Medicare HMO | Attending: Hematology

## 2023-05-12 ENCOUNTER — Inpatient Hospital Stay: Payer: Medicare HMO

## 2023-05-12 VITALS — BP 155/60 | HR 56 | Temp 97.9°F | Resp 16

## 2023-05-12 DIAGNOSIS — C184 Malignant neoplasm of transverse colon: Secondary | ICD-10-CM | POA: Insufficient documentation

## 2023-05-12 DIAGNOSIS — Z79899 Other long term (current) drug therapy: Secondary | ICD-10-CM | POA: Insufficient documentation

## 2023-05-12 DIAGNOSIS — C787 Secondary malignant neoplasm of liver and intrahepatic bile duct: Secondary | ICD-10-CM | POA: Diagnosis not present

## 2023-05-12 DIAGNOSIS — Z7189 Other specified counseling: Secondary | ICD-10-CM

## 2023-05-12 DIAGNOSIS — Z95828 Presence of other vascular implants and grafts: Secondary | ICD-10-CM

## 2023-05-12 DIAGNOSIS — Z5112 Encounter for antineoplastic immunotherapy: Secondary | ICD-10-CM | POA: Insufficient documentation

## 2023-05-12 LAB — CBC WITH DIFFERENTIAL (CANCER CENTER ONLY)
Abs Immature Granulocytes: 0.01 10*3/uL (ref 0.00–0.07)
Basophils Absolute: 0 10*3/uL (ref 0.0–0.1)
Basophils Relative: 1 %
Eosinophils Absolute: 0.2 10*3/uL (ref 0.0–0.5)
Eosinophils Relative: 3 %
HCT: 34 % — ABNORMAL LOW (ref 36.0–46.0)
Hemoglobin: 10.9 g/dL — ABNORMAL LOW (ref 12.0–15.0)
Immature Granulocytes: 0 %
Lymphocytes Relative: 33 %
Lymphs Abs: 2.4 10*3/uL (ref 0.7–4.0)
MCH: 32 pg (ref 26.0–34.0)
MCHC: 32.1 g/dL (ref 30.0–36.0)
MCV: 99.7 fL (ref 80.0–100.0)
Monocytes Absolute: 0.7 10*3/uL (ref 0.1–1.0)
Monocytes Relative: 9 %
Neutro Abs: 3.9 10*3/uL (ref 1.7–7.7)
Neutrophils Relative %: 54 %
Platelet Count: 161 10*3/uL (ref 150–400)
RBC: 3.41 MIL/uL — ABNORMAL LOW (ref 3.87–5.11)
RDW: 17 % — ABNORMAL HIGH (ref 11.5–15.5)
WBC Count: 7.2 10*3/uL (ref 4.0–10.5)
nRBC: 0 % (ref 0.0–0.2)

## 2023-05-12 LAB — COMPREHENSIVE METABOLIC PANEL
ALT: 9 U/L (ref 0–44)
AST: 17 U/L (ref 15–41)
Albumin: 3.9 g/dL (ref 3.5–5.0)
Alkaline Phosphatase: 74 U/L (ref 38–126)
Anion gap: 2 — ABNORMAL LOW (ref 5–15)
BUN: 29 mg/dL — ABNORMAL HIGH (ref 8–23)
CO2: 22 mmol/L (ref 22–32)
Calcium: 9.4 mg/dL (ref 8.9–10.3)
Chloride: 109 mmol/L (ref 98–111)
Creatinine, Ser: 1.33 mg/dL — ABNORMAL HIGH (ref 0.44–1.00)
GFR, Estimated: 45 mL/min — ABNORMAL LOW (ref >60)
Glucose, Bld: 71 mg/dL (ref 70–99)
Potassium: 4.7 mmol/L (ref 3.5–5.1)
Sodium: 133 mmol/L — ABNORMAL LOW (ref 135–145)
Total Bilirubin: 0.5 mg/dL (ref 0.0–1.2)
Total Protein: 7.7 g/dL (ref 6.5–8.1)

## 2023-05-12 LAB — TOTAL PROTEIN, URINE DIPSTICK: Protein, ur: NEGATIVE mg/dL

## 2023-05-12 MED ORDER — SODIUM CHLORIDE 0.9 % IV SOLN
7.5000 mg/kg | Freq: Once | INTRAVENOUS | Status: AC
  Administered 2023-05-12: 800 mg via INTRAVENOUS
  Filled 2023-05-12: qty 32

## 2023-05-12 MED ORDER — SODIUM CHLORIDE 0.9% FLUSH
10.0000 mL | Freq: Once | INTRAVENOUS | Status: AC
  Administered 2023-05-12: 10 mL

## 2023-05-12 MED ORDER — HEPARIN SOD (PORK) LOCK FLUSH 100 UNIT/ML IV SOLN
500.0000 [IU] | Freq: Once | INTRAVENOUS | Status: AC | PRN
  Administered 2023-05-12: 500 [IU]

## 2023-05-12 MED ORDER — SODIUM CHLORIDE 0.9% FLUSH
10.0000 mL | INTRAVENOUS | Status: DC | PRN
  Administered 2023-05-12: 10 mL

## 2023-05-12 MED ORDER — SODIUM CHLORIDE 0.9 % IV SOLN: Freq: Once | INTRAVENOUS | Status: AC

## 2023-05-12 NOTE — Patient Instructions (Signed)
 [facility] - A DEPT OF [facility]  Discharge Instructions: Thank you for choosing [facility] to provide your oncology and hematology care.   If you have a lab appointment with the Cancer Center, please go directly to the Cancer Center and check in at the registration area.   Wear comfortable clothing and clothing appropriate for easy access to any Portacath or PICC line.   We strive to give you quality time with your provider. You may need to reschedule your appointment if you arrive late (15 or more minutes).  Arriving late affects you and other patients whose appointments are after yours.  Also, if you miss three or more appointments without notifying the office, you may be dismissed from the clinic at the provider's discretion.      For prescription refill requests, have your pharmacy contact our office and allow 72 hours for refills to be completed.    Today you received the following chemotherapy and/or immunotherapy agents: bevacizumab-adcd      To help prevent nausea and vomiting after your treatment, we encourage you to take your nausea medication as directed.  BELOW ARE SYMPTOMS THAT SHOULD BE REPORTED IMMEDIATELY: *FEVER GREATER THAN 100.4 F (38 C) OR HIGHER *CHILLS OR SWEATING *NAUSEA AND VOMITING THAT IS NOT CONTROLLED WITH YOUR NAUSEA MEDICATION *UNUSUAL SHORTNESS OF BREATH *UNUSUAL BRUISING OR BLEEDING *URINARY PROBLEMS (pain or burning when urinating, or frequent urination) *BOWEL PROBLEMS (unusual diarrhea, constipation, pain near the anus) TENDERNESS IN MOUTH AND THROAT WITH OR WITHOUT PRESENCE OF ULCERS (sore throat, sores in mouth, or a toothache) UNUSUAL RASH, SWELLING OR PAIN  UNUSUAL VAGINAL DISCHARGE OR ITCHING   Items with * indicate a potential emergency and should be followed up as soon as possible or go to the Emergency Department if any problems should occur.  Please show the CHEMOTHERAPY ALERT CARD or  IMMUNOTHERAPY ALERT CARD at check-in to the Emergency Department and triage nurse.  Should you have questions after your visit or need to cancel or reschedule your appointment, please contact [facility] - A DEPT OF [facility]  Dept: [phone]  and follow the prompts.  Office hours are 8:00 a.m. to 4:30 p.m. Monday - Friday. Please note that voicemails left after 4:00 p.m. may not be returned until the following business day.  We are closed weekends and major holidays. You have access to a nurse at all times for urgent questions. Please call the main number to the clinic Dept: [phone] and follow the prompts.   For any non-urgent questions, you may also contact your provider using MyChart. We now offer e-Visits for anyone 94 and older to request care online for non-urgent symptoms. For details visit [email or web address].   Also download the MyChart app! Go to the app store, search "MyChart", open the app, select [place], and log in with your MyChart username and password.

## 2023-05-20 DIAGNOSIS — T8131XD Disruption of external operation (surgical) wound, not elsewhere classified, subsequent encounter: Secondary | ICD-10-CM | POA: Diagnosis not present

## 2023-05-20 DIAGNOSIS — Z933 Colostomy status: Secondary | ICD-10-CM | POA: Diagnosis not present

## 2023-05-20 DIAGNOSIS — S31109A Unspecified open wound of abdominal wall, unspecified quadrant without penetration into peritoneal cavity, initial encounter: Secondary | ICD-10-CM | POA: Diagnosis not present

## 2023-05-20 DIAGNOSIS — K56609 Unspecified intestinal obstruction, unspecified as to partial versus complete obstruction: Secondary | ICD-10-CM | POA: Diagnosis not present

## 2023-05-27 ENCOUNTER — Ambulatory Visit: Payer: Medicare HMO | Admitting: Podiatry

## 2023-06-01 ENCOUNTER — Ambulatory Visit: Payer: Medicare HMO

## 2023-06-01 ENCOUNTER — Other Ambulatory Visit: Payer: Medicare HMO

## 2023-06-01 ENCOUNTER — Ambulatory Visit: Payer: Medicare HMO | Admitting: Hematology

## 2023-06-02 ENCOUNTER — Inpatient Hospital Stay (HOSPITAL_BASED_OUTPATIENT_CLINIC_OR_DEPARTMENT_OTHER): Payer: Medicare HMO | Admitting: Hematology

## 2023-06-02 ENCOUNTER — Other Ambulatory Visit: Payer: Self-pay

## 2023-06-02 ENCOUNTER — Encounter: Payer: Self-pay | Admitting: Hematology

## 2023-06-02 ENCOUNTER — Inpatient Hospital Stay: Payer: Medicare HMO

## 2023-06-02 DIAGNOSIS — Z7189 Other specified counseling: Secondary | ICD-10-CM

## 2023-06-02 DIAGNOSIS — C189 Malignant neoplasm of colon, unspecified: Secondary | ICD-10-CM

## 2023-06-02 DIAGNOSIS — C184 Malignant neoplasm of transverse colon: Secondary | ICD-10-CM | POA: Diagnosis not present

## 2023-06-02 DIAGNOSIS — C787 Secondary malignant neoplasm of liver and intrahepatic bile duct: Secondary | ICD-10-CM

## 2023-06-02 DIAGNOSIS — Z95828 Presence of other vascular implants and grafts: Secondary | ICD-10-CM

## 2023-06-02 DIAGNOSIS — Z79899 Other long term (current) drug therapy: Secondary | ICD-10-CM | POA: Diagnosis not present

## 2023-06-02 DIAGNOSIS — Z5112 Encounter for antineoplastic immunotherapy: Secondary | ICD-10-CM | POA: Diagnosis not present

## 2023-06-02 LAB — CBC WITH DIFFERENTIAL (CANCER CENTER ONLY)
Abs Immature Granulocytes: 0.01 10*3/uL (ref 0.00–0.07)
Basophils Absolute: 0.1 10*3/uL (ref 0.0–0.1)
Basophils Relative: 1 %
Eosinophils Absolute: 0.2 10*3/uL (ref 0.0–0.5)
Eosinophils Relative: 3 %
HCT: 34.6 % — ABNORMAL LOW (ref 36.0–46.0)
Hemoglobin: 11.1 g/dL — ABNORMAL LOW (ref 12.0–15.0)
Immature Granulocytes: 0 %
Lymphocytes Relative: 34 %
Lymphs Abs: 2.2 10*3/uL (ref 0.7–4.0)
MCH: 32.5 pg (ref 26.0–34.0)
MCHC: 32.1 g/dL (ref 30.0–36.0)
MCV: 101.2 fL — ABNORMAL HIGH (ref 80.0–100.0)
Monocytes Absolute: 0.5 10*3/uL (ref 0.1–1.0)
Monocytes Relative: 8 %
Neutro Abs: 3.5 10*3/uL (ref 1.7–7.7)
Neutrophils Relative %: 54 %
Platelet Count: 152 10*3/uL (ref 150–400)
RBC: 3.42 MIL/uL — ABNORMAL LOW (ref 3.87–5.11)
RDW: 17.1 % — ABNORMAL HIGH (ref 11.5–15.5)
WBC Count: 6.5 10*3/uL (ref 4.0–10.5)
nRBC: 0 % (ref 0.0–0.2)

## 2023-06-02 LAB — COMPREHENSIVE METABOLIC PANEL
ALT: 13 U/L (ref 0–44)
AST: 21 U/L (ref 15–41)
Albumin: 4 g/dL (ref 3.5–5.0)
Alkaline Phosphatase: 78 U/L (ref 38–126)
Anion gap: 4 — ABNORMAL LOW (ref 5–15)
BUN: 26 mg/dL — ABNORMAL HIGH (ref 8–23)
CO2: 21 mmol/L — ABNORMAL LOW (ref 22–32)
Calcium: 9.6 mg/dL (ref 8.9–10.3)
Chloride: 110 mmol/L (ref 98–111)
Creatinine, Ser: 1.14 mg/dL — ABNORMAL HIGH (ref 0.44–1.00)
GFR, Estimated: 54 mL/min — ABNORMAL LOW (ref >60)
Glucose, Bld: 117 mg/dL — ABNORMAL HIGH (ref 70–99)
Potassium: 4.7 mmol/L (ref 3.5–5.1)
Sodium: 135 mmol/L (ref 135–145)
Total Bilirubin: 0.7 mg/dL (ref 0.0–1.2)
Total Protein: 7.8 g/dL (ref 6.5–8.1)

## 2023-06-02 LAB — TOTAL PROTEIN, URINE DIPSTICK: Protein, ur: NEGATIVE mg/dL

## 2023-06-02 MED ORDER — SODIUM CHLORIDE 0.9 % IV SOLN
7.5000 mg/kg | Freq: Once | INTRAVENOUS | Status: AC
  Administered 2023-06-02: 800 mg via INTRAVENOUS
  Filled 2023-06-02: qty 32

## 2023-06-02 MED ORDER — SODIUM CHLORIDE 0.9% FLUSH
10.0000 mL | Freq: Once | INTRAVENOUS | Status: AC
Start: 2023-06-02 — End: 2023-06-02
  Administered 2023-06-02: 10 mL

## 2023-06-02 MED ORDER — CAPECITABINE 500 MG PO TABS
2000.0000 mg | ORAL_TABLET | Freq: Two times a day (BID) | ORAL | 2 refills | Status: DC
Start: 2023-06-02 — End: 2023-09-15

## 2023-06-02 MED ORDER — SODIUM CHLORIDE 0.9% FLUSH
10.0000 mL | INTRAVENOUS | Status: DC | PRN
  Administered 2023-06-02: 10 mL

## 2023-06-02 MED ORDER — SODIUM CHLORIDE 0.9 % IV SOLN: Freq: Once | INTRAVENOUS | Status: AC

## 2023-06-02 MED ORDER — HEPARIN SOD (PORK) LOCK FLUSH 100 UNIT/ML IV SOLN
500.0000 [IU] | Freq: Once | INTRAVENOUS | Status: AC | PRN
  Administered 2023-06-02: 500 [IU]

## 2023-06-02 NOTE — Assessment & Plan Note (Signed)
 [id number] stage IV with liver and nodal metastasis, MSS, KRAS G12S(+)  -Diagnosed in 01/2019 after emergent colectomy and liver biopsy. 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  -Started first line chemo on 03/12/2019, received 5FU/leuc with first 2 cycles for large open abdominal wound after surgery. She started full dose FOLFOX and avastin with cycle 2. Due to neuropathy Oxaliplatin was stopped after 09/24/19.  --For convenience she was switched to oral chemo Xeloda 2 weeks on/1 week off and Bev every 3 weeks in 01/21/20. She is tolerating well -Her restaging CT scan from 03/11/2023 showed stable liver metastasis, no other new lesions. I personally reviewed with pt  -She is clinically doing well, will continue current therapy. -Since she is tolerating treatment very well, I will see her every other treatment (6 week)

## 2023-06-02 NOTE — Progress Notes (Signed)
 [facility]   [phone] [phone]   Clinic Follow up Note   Patient Care Team: Philip Aspen, Limmie Patricia, MD as PCP - General (Internal Medicine) Rollene Rotunda, MD as PCP - Cardiology (Cardiology) Berna Bue, MD as Consulting Physician (General Surgery) Malachy Mood, MD as Consulting Physician (Hematology) Pollyann Samples, NP as Nurse Practitioner (Nurse Practitioner)  Date of Service:  06/02/2023  CHIEF COMPLAINT: f/u of metastatic colon cancer  CURRENT THERAPY:  Oral chemo capecitabine and bevacizumab infusion every 3 weeks  Oncology History   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  -Started first line chemo on 03/12/2019, received 5FU/leuc with first 2 cycles for large open abdominal wound after surgery. She started full dose FOLFOX and avastin with cycle 2. Due to neuropathy Oxaliplatin was stopped after 09/24/19.  --For convenience she was switched to oral chemo Xeloda 2 weeks on/1 week off and Bev every 3 weeks in 01/21/20. She is tolerating well -Her restaging CT scan from 03/11/2023 showed stable liver metastasis, no other new lesions. I personally reviewed with pt  -She is clinically doing well, will continue current therapy. -Since she is tolerating treatment very well, I will see her every other treatment (6 week)    Assessment and Plan    Metastatic Colon Cancer Follow-up for metastatic colon cancer. Currently undergoing chemotherapy with stable blood counts (hemoglobin 11.1) and no significant side effects. Recently experienced soreness from a fall, managed with Tylenol. Discussed continuing chemotherapy, which has maintained stability for five years. Risks include fatigue and nausea. Alternatives include different  chemotherapy regimens or palliative care, but she prefers current treatment due to its effectiveness. - Continue chemotherapy infusions every three weeks - Schedule a scan in the first week of April - Follow-up appointment on April 10 - Use heating pad for soreness from fall - Continue using Eucerin for dry skin - Wear gloves when handling water to prevent skin dryness  Hypertension Blood pressure slightly elevated, attributed to missed medication and pain from recent fall. - Ensure adherence to blood pressure medication regimen  General Health Maintenance Proactive about health and follows treatment plan diligently. 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 Bowel-gas pattern consistent with small bowel obstruction. No free air. 2. No acute chest findings.   01/23/2019 Imaging   CT AP IMPRESSION: Obstructing mid transverse colonic mass with mild regional adenopathy and hepatic metastatic disease. The mass likely extends through the serosa; no ascites or peritoneal nodularity.   01/24/2019 Surgery   Surgeon: Berna Bue MD Assistant: Mattie Marlin PA-C Procedure performed: Transverse colectomy with end colostomy, liver biopsy Procedure classification: URGENT/EMERGENT Preop diagnosis: Obstructing, metastatic transverse colon mass Post-op diagnosis/intraop findings: Same   01/24/2019 Pathology  Results    FINAL MICROSCOPIC DIAGNOSIS:   A. COLON, TRANSVERSE, RESECTION:  Colonic adenocarcinoma, 5 cm.  Carcinoma extends into pericolonic connective tissue and focally to  serosal surface.  Margins not involved.  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   B. LIVER NODULE, LEFT, BIOPSY:  Metastatic adenocarcinoma.    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 Hypermetabolic metastatic disease in the liver and mediastinal/hilar/axillary lymph nodes. 2. Focal hypermetabolism in the rectum. Continued attention on follow-up exams is warranted. 3. Focal hypermetabolism medial to the right adrenal gland may be within a metastatic lymph node, better visualized on 01/23/2019. 4. Aortic atherosclerosis (ICD10-170.0). Coronary artery calcification.   03/12/2019 -  Chemotherapy   She started 5FU q2weeks on 03/12/19 for 2 cycles. She started full dose FOLFOX with Avastin on 04/09/19. Oxaliplatin dose reduced repeatedly due to neuropathy C12 and held since C16 on 10/09/19. Now on maintenance Avastin and 5FU q2weeks since 10/09/19       -Maintenance change to maintenance xeloda 2000 mg BID days 1-14 q21 days and q3 weeks Zirabev (15 mg/kg) starting 01/21/20. First cycle was taken 1000mg  BID due to misunderstanding.    05/31/2019 Imaging   Restaging CT CAP IMPRESSION: 1. Similar to mild interval decrease in size of multiple hepatic lesions, partially calcified. 2. 2 mm right upper lobe pulmonary nodule. Recommend attention on follow-up. 3. Emphysema and aortic atherosclerosis.   08/23/2019 Imaging   CT CAP w contrast  IMPRESSION: 1. The dominant peripheral right liver metastasis has mildly increased. Other smaller liver  metastases are stable. 2. Otherwise no new or progressive metastatic disease in the chest, abdomen or pelvis. 3. Aortic Atherosclerosis (ICD10-I70.0) and Emphysema (ICD10-J43.9).   11/27/2019 Imaging   CT CAP w contrast  IMPRESSION: Status post transverse colectomy with right mid abdominal colostomy.   Mildly progressive hepatic metastases, as above.   No evidence of metastatic disease in the chest. Small mediastinal lymph nodes are within normal limits.   Additional stable ancillary findings as above.   01/21/2020 -  Chemotherapy   Patient is on Treatment Plan : COLORECTAL Bevacizumab q21d     02/21/2020 Imaging   IMPRESSION: 1. Stable hepatic metastatic disease. 2. Aortic atherosclerosis (ICD10-I70.0). Coronary artery calcification. 3.  Emphysema (ICD10-J43.9).   05/19/2020 Imaging   CT CAP  IMPRESSION: 1. Multiple partially calcified liver metastases are again noted. With the exception of a small lesion in segment 7/8 lesions are not significantly changed in the interval. No new liver lesions identified. 2. Coronary artery atherosclerotic calcifications. 3. Aortic atherosclerosis. 4. 2 mm right upper lobe lung nodule identified.  Unchanged.   Aortic Atherosclerosis (ICD10-I70.0).   11/17/2020 Imaging   CT CAP  IMPRESSION: 1. Partially calcified lesions throughout the liver are stable accounting for differences in technique, contrasted imaging on today's study is compared to noncontrast imaging on the prior. 2. No new hepatic lesions. 3. Tiny 3 mm pulmonary nodule in the RIGHT upper lobe unchanged since the prior study. Attention on follow-up. 4. Mild fullness of RIGHT paratracheal nodal tissue is minimally increased and borderline enlarged, attention on follow-up. 5. RIGHT lower quadrant colostomy. 6. 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 7.  Aortic atherosclerosis.   06/09/2021 Imaging   EXAM: CT CHEST, ABDOMEN, AND PELVIS WITH CONTRAST  IMPRESSION: Stable calcified liver metastases.   Stable mildly enlarged right paratracheal lymph node.   No evidence of new or progressive metastatic disease.   Aortic Atherosclerosis (ICD10-I70.0).   09/14/2021 PET scan   IMPRESSION: Multiple hypermetabolic and partially calcified liver metastases, without significant change compared to most recent CT.   Mild hypermetabolic mediastinal and bilateral hilar lymphadenopathy, also without significant change.   No evidence of new or progressive metastatic disease.     Electronically Signed   By: Danae Orleans M.D.   On: 09/15/2021 11:04   04/15/2022 Imaging    IMPRESSION: 1. Several previously noted partially calcified hepatic metastases appear grossly stable compared to the prior examination. No new hepatic lesions or other signs of metastatic disease noted elsewhere in the chest, abdomen or pelvis. 2. Aortic atherosclerosis, in addition to left main and three-vessel coronary artery disease. Please note that although the presence of coronary artery calcium documents the presence of coronary artery disease, the severity of this disease and any potential stenosis cannot be assessed on this non-gated CT examination. Assessment for potential risk factor modification, dietary therapy or pharmacologic therapy may be warranted, if clinically indicated. 3. There are calcifications of the mitral annulus. Echocardiographic correlation for evaluation of potential valvular dysfunction may be warranted if clinically indicated. 4. Additional incidental findings, similar to prior studies, as above.   07/15/2022 Imaging    IMPRESSION: 1. Stable exam. No new or progressive findings in the abdomen or pelvis to suggest worsening disease. 2. No substantial change in calcified liver metastases. 3. Right abdominal transverse end colostomy with  parastomal herniation of fat and colon proximal to the stoma. 4. Probable left-sided uterine fibroid measuring on the order of 3.1 cm and displacing the endometrial stripe to the right. Pelvic ultrasound could be used to further evaluate as clinically warranted. 5.  Aortic Atherosclerosis (ICD10-I70.0).        Discussed the use of AI scribe software for clinical note transcription with the patient, who gave verbal consent to proceed.  History of Present Illness   The patient, with a history of metastatic colon cancer, presents for a routine follow-up. 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 She experienced a fall at work due to a loss of balance, landing on her side and hip. She did not notice any bruising but reports soreness, which she has been managing with Tylenol.  She has been taking her chemotherapy medication without any reported issues. She mentions that her skin remains dry, a side effect of the chemotherapy, which she manages by wearing gloves when working with water and using Eucerin moisturizer. She also reports dryness on the soles of her feet.  The patient has not reported any pain or other issues, aside from the soreness from the fall. She has been managing her blood pressure with medication, but notes that it was slightly elevated at this visit, possibly due to pain from the fall.         All other systems were reviewed with the patient and are negative.  MEDICAL HISTORY:  Past Medical History:  Diagnosis Date   Anemia    low iron   Colon cancer (HCC) 01/2019   Hypertension 01/23/2019   Personal history of chemotherapy 01/2019   colon CA  SBO (small bowel obstruction) (HCC) 01/23/2019    SURGICAL HISTORY: Past Surgical History:  Procedure Laterality Date   CESAREAN SECTION     x2   COLOSTOMY N/A 01/24/2019   Procedure: End Loop Colostomy;  Surgeon: Berna Bue, MD;  Location: [facility]  OR;  Service: General;  Laterality: N/A;   PARTIAL COLECTOMY N/A 01/24/2019   Procedure: PARTIAL COLECTOMY;  Surgeon: Berna Bue, MD;  Location: [facility] OR;  Service: General;  Laterality: N/A;   PORTACATH PLACEMENT Right 02/28/2019   Procedure: INSERTION PORT-A-CATH WITH ULTRASOUND GUIDANCE;  Surgeon: Berna Bue, MD;  Location: [facility] OR;  Service: General;  Laterality: Right;    I have reviewed the social history and family history with the patient and they are unchanged from previous note.  ALLERGIES:  has no known allergies.  MEDICATIONS:  Current Outpatient Medications  Medication Sig Dispense Refill   acetaminophen (TYLENOL) 325 MG tablet Take 2 tablets (650 mg total) by mouth every 6 (six) hours as needed.     amLODipine (NORVASC) 10 MG tablet TAKE 1 TABLET BY MOUTH EVERY DAY 90 tablet 1   benzonatate (TESSALON) 100 MG capsule Take 1 capsule (100 mg total) by mouth 2 (two) times daily as needed for cough. 20 capsule 0   capecitabine (XELODA) 500 MG tablet Take 4 tablets (2,000 mg total) by mouth 2 (two) times daily after a meal. Take for 14 days, then off for 7 days. 112 tablet 2   ferrous sulfate 325 (65 FE) MG tablet TAKE 1 TABLET BY MOUTH TWICE A DAY WITH FOOD 180 tablet 1   gabapentin (NEURONTIN) 300 MG capsule Take 1 capsule (300 mg total) by mouth 2 (two) times daily. 60 capsule 3   hydrALAZINE (APRESOLINE) 25 MG tablet TAKE 1 TABLET BY MOUTH EVERY 8 HOURS 270 tablet 0   hydrochlorothiazide (HYDRODIURIL) 12.5 MG tablet Take 1 tablet (12.5 mg total) by mouth daily. 90 tablet 1   hydrocortisone (ANUSOL-HC) 2.5 % rectal cream Place 1 application rectally 2 (two) times daily. 30 g 0   Ibuprofen 200 MG CAPS Take 400 mg by mouth daily as needed (pain).     isosorbide mononitrate (IMDUR) 30 MG 24 hr tablet TAKE 1 TABLET BY MOUTH EVERY DAY 90 tablet 1   levothyroxine (SYNTHROID) 25 MCG tablet Take 1 tablet (25 mcg total) by mouth daily. 90 tablet 1   lidocaine (LMX) 4 % cream  Apply 1 application topically 3 (three) times daily as needed. 30 g 0   lidocaine-prilocaine (EMLA) cream APPLY 1 APPLICATION TOPICALLY AS NEEDED. 30 g 1   lisinopril (ZESTRIL) 40 MG tablet TAKE 1 TABLET BY MOUTH EVERY DAY 90 tablet 1   ondansetron (ZOFRAN) 8 MG tablet Take 1 tablet (8 mg total) by mouth every 8 (eight) hours as needed for nausea or vomiting. 20 tablet 2   polycarbophil (FIBERCON) 625 MG tablet Take 1 tablet (625 mg total) by mouth daily. 30 tablet 0   prochlorperazine (COMPAZINE) 10 MG tablet Take 1 tablet (10 mg total) by mouth every 6 (six) hours as needed for nausea or vomiting. 30 tablet 2   Vitamin D, Ergocalciferol, (DRISDOL) 1.25 MG (50000 UNIT) CAPS capsule Take 1 capsule (50,000 Units total) by mouth every 7 (seven) days for 12 doses. 12 capsule 0   No current facility-administered medications for this visit.   Facility-Administered Medications Ordered in Other Visits  Medication Dose Route Frequency Provider Last Rate Last Admin   bevacizumab-adcd (VEGZELMA) 800 mg in sodium  chloride 0.9 % 100 mL chemo infusion  7.5 mg/kg (Treatment Plan Recorded) Intravenous Once Malachy Mood, MD       heparin lock flush 100 unit/mL  500 Units Intracatheter Once PRN Malachy Mood, MD       sodium chloride flush (NS) 0.9 % injection 10 mL  10 mL Intracatheter PRN Malachy Mood, MD   10 mL at 06/06/20 1051   sodium chloride flush (NS) 0.9 % injection 10 mL  10 mL Intracatheter PRN Malachy Mood, MD        PHYSICAL EXAMINATION: ECOG PERFORMANCE STATUS: 0 - Asymptomatic  Vitals:   06/02/23 1437  BP: (!) 146/62  Pulse: (!) 50  Resp: 15  Temp: 97.8 F (36.6 C)  SpO2: 100%   Wt Readings from Last 3 Encounters:  06/02/23 241 lb 3.2 oz (109.4 kg)  04/20/23 242 lb 14.4 oz (110.2 kg)  03/31/23 244 lb 12 oz (111 kg)     GENERAL:alert, no distress and comfortable SKIN: skin color, texture, turgor are normal, no rashes or significant lesions EYES: normal, Conjunctiva are pink and non-injected,  sclera clear NECK: supple, thyroid normal size, non-tender, without nodularity LYMPH:  no palpable lymphadenopathy in the cervical, axillary  LUNGS: clear to auscultation and percussion with normal breathing effort HEART: regular rate & rhythm and no murmurs and no lower extremity edema ABDOMEN:abdomen soft, non-tender and normal bowel sounds Musculoskeletal:no cyanosis of digits and no clubbing  NEURO: alert & oriented x 3 with fluent speech, no focal motor/sensory deficits       LABORATORY DATA:  I have reviewed the data as listed    Latest Ref Rng & Units 06/02/2023    2:18 PM 05/12/2023    2:24 PM 04/20/2023    9:54 AM  CBC  WBC 4.0 - 10.5 K/uL 6.5  7.2  7.7   Hemoglobin 12.0 - 15.0 g/dL 16.1  09.6  04.5   Hematocrit 36.0 - 46.0 % 34.6  34.0  35.3   Platelets 150 - 400 K/uL 152  161  177         Latest Ref Rng & Units 06/02/2023    2:18 PM 05/12/2023    2:24 PM 03/31/2023    9:30 AM  CMP  Glucose 70 - 99 mg/dL 409  71  88   BUN 8 - 23 mg/dL 26  29  34   Creatinine 0.44 - 1.00 mg/dL 8.11  9.14  7.82   Sodium 135 - 145 mmol/L 135  133  137   Potassium 3.5 - 5.1 mmol/L 4.7  4.7  4.4   Chloride 98 - 111 mmol/L 110  109  112   CO2 22 - 32 mmol/L 21  22  20    Calcium 8.9 - 10.3 mg/dL 9.6  9.4  9.4   Total Protein 6.5 - 8.1 g/dL 7.8  7.7  7.8   Total Bilirubin 0.0 - 1.2 mg/dL 0.7  0.5  0.5   Alkaline Phos 38 - 126 U/L 78  74  79   AST 15 - 41 U/L 21  17  20    ALT 0 - 44 U/L 13  9  12        RADIOGRAPHIC STUDIES: I have personally reviewed the radiological images as listed and agreed with the findings in the report. No results found.    Orders Placed This Encounter  Procedures   CT CHEST ABDOMEN PELVIS W CONTRAST    Standing Status:   Future    Expected Date:  07/07/2023    Expiration Date:   06/01/2024    If indicated for the ordered procedure, I authorize the administration of contrast media per Radiology protocol:   Yes    Does the patient have a contrast  media/X-ray dye allergy?:   No    Preferred imaging location?:   [facility]    If indicated for the ordered procedure, I authorize the administration of oral contrast media per Radiology protocol:   Yes   CBC with Differential (Cancer Center Only)    Standing Status:   Future    Expected Date:   06/23/2023    Expiration Date:   06/22/2024   Total Protein, Urine dipstick    Standing Status:   Future    Expected Date:   06/23/2023    Expiration Date:   06/22/2024   CBC with Differential (Cancer Center Only)    Standing Status:   Future    Expected Date:   07/14/2023    Expiration Date:   07/13/2024   Total Protein, Urine dipstick    Standing Status:   Future    Expected Date:   07/14/2023    Expiration Date:   07/13/2024   CBC with Differential (Cancer Center Only)    Standing Status:   Future    Expected Date:   08/04/2023    Expiration Date:   08/03/2024   Total Protein, Urine dipstick    Standing Status:   Future    Expected Date:   08/04/2023    Expiration Date:   08/03/2024   CBC with Differential (Cancer Center Only)    Standing Status:   Future    Expected Date:   08/25/2023    Expiration Date:   08/24/2024   Total Protein, Urine dipstick    Standing Status:   Future    Expected Date:   08/25/2023    Expiration Date:   08/24/2024   All questions were answered. The patient knows to call the clinic with any problems, questions or concerns. No barriers to learning was detected. The total time spent in the appointment was 25 minutes.     Malachy Mood, MD 06/02/2023

## 2023-06-03 ENCOUNTER — Other Ambulatory Visit: Payer: Self-pay

## 2023-06-03 LAB — CEA (ACCESS): CEA (CHCC): 144.69 ng/mL — ABNORMAL HIGH (ref 0.00–5.00)

## 2023-06-04 ENCOUNTER — Other Ambulatory Visit: Payer: Self-pay

## 2023-06-06 ENCOUNTER — Other Ambulatory Visit: Payer: Self-pay

## 2023-06-21 DIAGNOSIS — K56609 Unspecified intestinal obstruction, unspecified as to partial versus complete obstruction: Secondary | ICD-10-CM | POA: Diagnosis not present

## 2023-06-21 DIAGNOSIS — S31109A Unspecified open wound of abdominal wall, unspecified quadrant without penetration into peritoneal cavity, initial encounter: Secondary | ICD-10-CM | POA: Diagnosis not present

## 2023-06-21 DIAGNOSIS — Z933 Colostomy status: Secondary | ICD-10-CM | POA: Diagnosis not present

## 2023-06-21 DIAGNOSIS — T8131XD Disruption of external operation (surgical) wound, not elsewhere classified, subsequent encounter: Secondary | ICD-10-CM | POA: Diagnosis not present

## 2023-06-23 ENCOUNTER — Inpatient Hospital Stay: Payer: Medicare HMO | Attending: Hematology

## 2023-06-23 ENCOUNTER — Inpatient Hospital Stay: Payer: Medicare HMO

## 2023-06-23 VITALS — BP 126/60 | HR 83 | Temp 97.8°F | Resp 18

## 2023-06-23 DIAGNOSIS — C787 Secondary malignant neoplasm of liver and intrahepatic bile duct: Secondary | ICD-10-CM | POA: Insufficient documentation

## 2023-06-23 DIAGNOSIS — Z79899 Other long term (current) drug therapy: Secondary | ICD-10-CM | POA: Insufficient documentation

## 2023-06-23 DIAGNOSIS — C184 Malignant neoplasm of transverse colon: Secondary | ICD-10-CM | POA: Diagnosis not present

## 2023-06-23 DIAGNOSIS — C189 Malignant neoplasm of colon, unspecified: Secondary | ICD-10-CM

## 2023-06-23 DIAGNOSIS — Z5112 Encounter for antineoplastic immunotherapy: Secondary | ICD-10-CM | POA: Diagnosis not present

## 2023-06-23 DIAGNOSIS — Z7189 Other specified counseling: Secondary | ICD-10-CM

## 2023-06-23 DIAGNOSIS — Z95828 Presence of other vascular implants and grafts: Secondary | ICD-10-CM

## 2023-06-23 LAB — CBC WITH DIFFERENTIAL (CANCER CENTER ONLY)
Abs Immature Granulocytes: 0.02 10*3/uL (ref 0.00–0.07)
Basophils Absolute: 0 10*3/uL (ref 0.0–0.1)
Basophils Relative: 1 %
Eosinophils Absolute: 0.2 10*3/uL (ref 0.0–0.5)
Eosinophils Relative: 3 %
HCT: 33.3 % — ABNORMAL LOW (ref 36.0–46.0)
Hemoglobin: 10.9 g/dL — ABNORMAL LOW (ref 12.0–15.0)
Immature Granulocytes: 0 %
Lymphocytes Relative: 29 %
Lymphs Abs: 2.2 10*3/uL (ref 0.7–4.0)
MCH: 32.1 pg (ref 26.0–34.0)
MCHC: 32.7 g/dL (ref 30.0–36.0)
MCV: 97.9 fL (ref 80.0–100.0)
Monocytes Absolute: 0.6 10*3/uL (ref 0.1–1.0)
Monocytes Relative: 8 %
Neutro Abs: 4.4 10*3/uL (ref 1.7–7.7)
Neutrophils Relative %: 59 %
Platelet Count: 164 10*3/uL (ref 150–400)
RBC: 3.4 MIL/uL — ABNORMAL LOW (ref 3.87–5.11)
RDW: 16.5 % — ABNORMAL HIGH (ref 11.5–15.5)
WBC Count: 7.4 10*3/uL (ref 4.0–10.5)
nRBC: 0 % (ref 0.0–0.2)

## 2023-06-23 LAB — COMPREHENSIVE METABOLIC PANEL
ALT: 11 U/L (ref 0–44)
AST: 18 U/L (ref 15–41)
Albumin: 3.9 g/dL (ref 3.5–5.0)
Alkaline Phosphatase: 73 U/L (ref 38–126)
Anion gap: 3 — ABNORMAL LOW (ref 5–15)
BUN: 27 mg/dL — ABNORMAL HIGH (ref 8–23)
CO2: 21 mmol/L — ABNORMAL LOW (ref 22–32)
Calcium: 9.4 mg/dL (ref 8.9–10.3)
Chloride: 109 mmol/L (ref 98–111)
Creatinine, Ser: 0.95 mg/dL (ref 0.44–1.00)
GFR, Estimated: >60 mL/min (ref >60)
Glucose, Bld: 76 mg/dL (ref 70–99)
Potassium: 4.3 mmol/L (ref 3.5–5.1)
Sodium: 133 mmol/L — ABNORMAL LOW (ref 135–145)
Total Bilirubin: 0.5 mg/dL (ref 0.0–1.2)
Total Protein: 7.6 g/dL (ref 6.5–8.1)

## 2023-06-23 LAB — TOTAL PROTEIN, URINE DIPSTICK: Protein, ur: NEGATIVE mg/dL

## 2023-06-23 MED ORDER — HEPARIN SOD (PORK) LOCK FLUSH 100 UNIT/ML IV SOLN
500.0000 [IU] | Freq: Once | INTRAVENOUS | Status: AC | PRN
  Administered 2023-06-23: 500 [IU]

## 2023-06-23 MED ORDER — SODIUM CHLORIDE 0.9 % IV SOLN
7.5000 mg/kg | Freq: Once | INTRAVENOUS | Status: AC
  Administered 2023-06-23: 800 mg via INTRAVENOUS
  Filled 2023-06-23: qty 32

## 2023-06-23 MED ORDER — SODIUM CHLORIDE 0.9% FLUSH
10.0000 mL | INTRAVENOUS | Status: DC | PRN
  Administered 2023-06-23: 10 mL

## 2023-06-23 MED ORDER — SODIUM CHLORIDE 0.9 % IV SOLN: Freq: Once | INTRAVENOUS | Status: AC

## 2023-06-23 MED ORDER — SODIUM CHLORIDE 0.9% FLUSH
10.0000 mL | Freq: Once | INTRAVENOUS | Status: AC
  Administered 2023-06-23: 10 mL

## 2023-06-23 NOTE — Progress Notes (Signed)
 Patients port flushed without difficulty.  Good blood return noted with no bruising or swelling noted at site.  Patient remains accessed for treatment.

## 2023-06-24 ENCOUNTER — Telehealth: Payer: Self-pay | Admitting: *Deleted

## 2023-06-24 NOTE — Telephone Encounter (Signed)
 Called pt to see how she did with her recent treatment.  She repots doing well & denies concerns.  She knows how to reach [place] if needed & knows her next appts.

## 2023-06-24 NOTE — Telephone Encounter (Signed)
-----   Message from Nurse Jiles Crocker sent at 06/23/2023  3:48 PM EDT ----- Regarding: First time Taxotere/Cytoxan First time Taxotere/Cytoxan. Patient tolerated well, order that slight elevated blood pressure, asymptomatic.

## 2023-07-07 ENCOUNTER — Ambulatory Visit (HOSPITAL_COMMUNITY)
Admission: RE | Admit: 2023-07-07 | Discharge: 2023-07-07 | Disposition: A | Discharge status: Home / Self Care | Source: Ambulatory Visit | Attending: Hematology | Admitting: Hematology

## 2023-07-07 DIAGNOSIS — C189 Malignant neoplasm of colon, unspecified: Secondary | ICD-10-CM | POA: Diagnosis not present

## 2023-07-07 DIAGNOSIS — R160 Hepatomegaly, not elsewhere classified: Secondary | ICD-10-CM | POA: Diagnosis not present

## 2023-07-07 DIAGNOSIS — C787 Secondary malignant neoplasm of liver and intrahepatic bile duct: Secondary | ICD-10-CM | POA: Diagnosis not present

## 2023-07-07 DIAGNOSIS — D259 Leiomyoma of uterus, unspecified: Secondary | ICD-10-CM | POA: Diagnosis not present

## 2023-07-07 MED ORDER — HEPARIN SOD (PORK) LOCK FLUSH 100 UNIT/ML IV SOLN
INTRAVENOUS | Status: AC
  Filled 2023-07-07: qty 5

## 2023-07-07 MED ORDER — IOHEXOL 300 MG/ML  SOLN
100.0000 mL | Freq: Once | INTRAMUSCULAR | Status: AC | PRN
  Administered 2023-07-07: 100 mL via INTRAVENOUS

## 2023-07-07 MED ORDER — HEPARIN SOD (PORK) LOCK FLUSH 100 UNIT/ML IV SOLN
500.0000 [IU] | Freq: Once | INTRAVENOUS | Status: AC
  Administered 2023-07-07: 500 [IU] via INTRAVENOUS

## 2023-07-07 MED ORDER — IOHEXOL 9 MG/ML PO SOLN
ORAL | Status: AC
  Filled 2023-07-07: qty 1000

## 2023-07-07 MED ORDER — IOHEXOL 9 MG/ML PO SOLN
500.0000 mL | ORAL | Status: AC
Start: 2023-07-07 — End: 2023-07-07
  Administered 2023-07-07 (×2): 500 mL via ORAL

## 2023-07-07 MED ORDER — SODIUM CHLORIDE (PF) 0.9 % IJ SOLN
INTRAMUSCULAR | Status: AC
  Filled 2023-07-07: qty 50

## 2023-07-14 ENCOUNTER — Inpatient Hospital Stay: Payer: Medicare HMO

## 2023-07-14 ENCOUNTER — Inpatient Hospital Stay (HOSPITAL_BASED_OUTPATIENT_CLINIC_OR_DEPARTMENT_OTHER): Payer: Medicare HMO | Admitting: Hematology

## 2023-07-14 ENCOUNTER — Encounter: Payer: Self-pay | Admitting: Hematology

## 2023-07-14 ENCOUNTER — Inpatient Hospital Stay: Payer: Medicare HMO | Attending: Hematology

## 2023-07-14 VITALS — BP 120/58 | HR 63 | Temp 97.3°F | Resp 17 | Ht 68.0 in | Wt 240.6 lb

## 2023-07-14 DIAGNOSIS — C184 Malignant neoplasm of transverse colon: Secondary | ICD-10-CM | POA: Insufficient documentation

## 2023-07-14 DIAGNOSIS — C787 Secondary malignant neoplasm of liver and intrahepatic bile duct: Secondary | ICD-10-CM | POA: Insufficient documentation

## 2023-07-14 DIAGNOSIS — Z7189 Other specified counseling: Secondary | ICD-10-CM

## 2023-07-14 DIAGNOSIS — C189 Malignant neoplasm of colon, unspecified: Secondary | ICD-10-CM

## 2023-07-14 DIAGNOSIS — Z5112 Encounter for antineoplastic immunotherapy: Secondary | ICD-10-CM | POA: Diagnosis not present

## 2023-07-14 DIAGNOSIS — Z79899 Other long term (current) drug therapy: Secondary | ICD-10-CM | POA: Insufficient documentation

## 2023-07-14 LAB — CEA (ACCESS): CEA (CHCC): 168.3 ng/mL — ABNORMAL HIGH (ref 0.00–5.00)

## 2023-07-14 LAB — COMPREHENSIVE METABOLIC PANEL WITH GFR
ALT: 10 U/L (ref 0–44)
AST: 19 U/L (ref 15–41)
Albumin: 4.1 g/dL (ref 3.5–5.0)
Alkaline Phosphatase: 92 U/L (ref 38–126)
Anion gap: 4 — ABNORMAL LOW (ref 5–15)
BUN: 30 mg/dL — ABNORMAL HIGH (ref 8–23)
CO2: 22 mmol/L (ref 22–32)
Calcium: 9.6 mg/dL (ref 8.9–10.3)
Chloride: 110 mmol/L (ref 98–111)
Creatinine, Ser: 1.3 mg/dL — ABNORMAL HIGH (ref 0.44–1.00)
GFR, Estimated: 46 mL/min — ABNORMAL LOW (ref >60)
Glucose, Bld: 76 mg/dL (ref 70–99)
Potassium: 4.3 mmol/L (ref 3.5–5.1)
Sodium: 136 mmol/L (ref 135–145)
Total Bilirubin: 0.5 mg/dL (ref 0.0–1.2)
Total Protein: 8 g/dL (ref 6.5–8.1)

## 2023-07-14 LAB — CBC WITH DIFFERENTIAL (CANCER CENTER ONLY)
Abs Immature Granulocytes: 0.01 10*3/uL (ref 0.00–0.07)
Basophils Absolute: 0.1 10*3/uL (ref 0.0–0.1)
Basophils Relative: 1 %
Eosinophils Absolute: 0.3 10*3/uL (ref 0.0–0.5)
Eosinophils Relative: 3 %
HCT: 33.3 % — ABNORMAL LOW (ref 36.0–46.0)
Hemoglobin: 11 g/dL — ABNORMAL LOW (ref 12.0–15.0)
Immature Granulocytes: 0 %
Lymphocytes Relative: 30 %
Lymphs Abs: 2.2 10*3/uL (ref 0.7–4.0)
MCH: 32.6 pg (ref 26.0–34.0)
MCHC: 33 g/dL (ref 30.0–36.0)
MCV: 98.8 fL (ref 80.0–100.0)
Monocytes Absolute: 0.5 10*3/uL (ref 0.1–1.0)
Monocytes Relative: 7 %
Neutro Abs: 4.3 10*3/uL (ref 1.7–7.7)
Neutrophils Relative %: 59 %
Platelet Count: 176 10*3/uL (ref 150–400)
RBC: 3.37 MIL/uL — ABNORMAL LOW (ref 3.87–5.11)
RDW: 16.6 % — ABNORMAL HIGH (ref 11.5–15.5)
WBC Count: 7.3 10*3/uL (ref 4.0–10.5)
nRBC: 0 % (ref 0.0–0.2)

## 2023-07-14 LAB — TOTAL PROTEIN, URINE DIPSTICK: Protein, ur: NEGATIVE mg/dL

## 2023-07-14 MED ORDER — SODIUM CHLORIDE 0.9 % IV SOLN
7.5000 mg/kg | Freq: Once | INTRAVENOUS | Status: AC
  Administered 2023-07-14: 800 mg via INTRAVENOUS
  Filled 2023-07-14: qty 32

## 2023-07-14 MED ORDER — HEPARIN SOD (PORK) LOCK FLUSH 100 UNIT/ML IV SOLN
500.0000 [IU] | Freq: Once | INTRAVENOUS | Status: AC | PRN
  Administered 2023-07-14: 500 [IU]

## 2023-07-14 MED ORDER — SODIUM CHLORIDE 0.9% FLUSH
10.0000 mL | INTRAVENOUS | Status: DC | PRN
  Administered 2023-07-14: 10 mL

## 2023-07-14 MED ORDER — SODIUM CHLORIDE 0.9 % IV SOLN: Freq: Once | INTRAVENOUS | Status: AC

## 2023-07-14 NOTE — Progress Notes (Signed)
 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) [id number] stage IV with liver and nodal metastasis, MSS, KRAS G12S(+)  -Diagnosed in 01/2019 after emergent colectomy and liver biopsy. 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  -Started first line chemo on 03/12/2019, received 5FU/leuc with first 2 cycles for large open abdominal wound after surgery. She started full dose FOLFOX and avastin with cycle 2. Due to neuropathy Oxaliplatin was stopped after 09/24/19.  --For convenience she was switched to oral chemo Xeloda 2 weeks on/1 week off and Bev every 3 weeks in 01/21/20. She is tolerating well -Her restaging CT scan from 03/11/2023 showed stable liver metastasis, no other new lesions. I personally reviewed with pt  -She is clinically doing well, will continue current therapy. -Since she is tolerating treatment very well, I will see her every other treatment (6 week)  Assessment & Plan Colon cancer with liver metastasis Colon cancer with liver metastasis, currently well-managed with possible mild progression. CT scan from July 07, 2023, shows stable calcified liver metastasis with one lesion increasing from 1.6 x 1.1 cm to 1.9 x 1.9 cm. Tumor markers stable around 144 for the past year. No new symptoms such as hepatic pain or discomfort reported. Current treatment regimen effective since October 2020. -  Continue capecitabine regimen. - Schedule follow-up CT scan in two months to assess progression. - Instruct her to report any new hepatic pain or discomfort.  Peripheral neuropathy Mild peripheral neuropathy in the foot, likely a side effect of cancer treatment, is manageable. - Continue gabapentin as needed for neuropathy.  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 Bowel-gas pattern consistent with small bowel obstruction. No free air. 2. No acute chest findings.   01/23/2019 Imaging   CT AP IMPRESSION: Obstructing mid transverse colonic mass with mild regional adenopathy and hepatic metastatic disease. The mass likely extends through the serosa; no ascites or peritoneal nodularity.   01/24/2019 Surgery   Surgeon: Berna Bue MD Assistant: Mattie Marlin PA-C Procedure performed: Transverse colectomy with end colostomy, liver biopsy Procedure classification: URGENT/EMERGENT Preop diagnosis: Obstructing, metastatic transverse colon mass Post-op diagnosis/intraop findings: Same   01/24/2019 Pathology Results   FINAL MICROSCOPIC DIAGNOSIS:   A. COLON, TRANSVERSE, RESECTION:  Colonic adenocarcinoma, 5 cm.  Carcinoma extends into pericolonic connective tissue and focally to  serosal surface.  Margins not involved.  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   B. LIVER NODULE, LEFT, BIOPSY:  Metastatic adenocarcinoma.    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 Hypermetabolic metastatic disease in the liver and mediastinal/hilar/axillary lymph nodes. 2. Focal hypermetabolism in the rectum. Continued attention on follow-up exams is warranted. 3. Focal hypermetabolism medial to the right adrenal gland may be within a metastatic lymph node, better visualized on 01/23/2019. 4. Aortic atherosclerosis (ICD10-170.0). Coronary artery calcification.   03/12/2019 -  Chemotherapy   She started 5FU q2weeks on 03/12/19 for 2 cycles. She started full dose FOLFOX with Avastin on 04/09/19. Oxaliplatin dose reduced repeatedly due to neuropathy C12 and held since C16 on 10/09/19. Now on maintenance Avastin and 5FU q2weeks since 10/09/19       -Maintenance change to maintenance xeloda 2000 mg BID days 1-14 q21 days and q3 weeks Zirabev (15 mg/kg) starting 01/21/20. First cycle was taken 1000mg  BID due to misunderstanding.    05/31/2019 Imaging   Restaging CT CAP IMPRESSION: 1. Similar to mild interval decrease in size of multiple hepatic lesions, partially calcified. 2. 2 mm right upper lobe pulmonary nodule. Recommend attention on follow-up. 3. Emphysema and aortic atherosclerosis.   08/23/2019 Imaging   CT CAP w contrast  IMPRESSION: 1. The dominant peripheral right liver metastasis has mildly increased. Other smaller liver metastases are stable. 2. Otherwise no new or progressive metastatic disease in the chest, abdomen or pelvis. 3. Aortic Atherosclerosis (ICD10-I70.0) and Emphysema (ICD10-J43.9).   11/27/2019 Imaging   CT CAP w contrast  IMPRESSION: Status post transverse colectomy with right mid abdominal colostomy.   Mildly progressive hepatic metastases, as above.   No evidence of metastatic disease  in the chest. Small mediastinal lymph nodes are within normal limits.   Additional stable ancillary findings as above.   01/21/2020 -  Chemotherapy   Patient is on Treatment Plan : COLORECTAL Bevacizumab q21d     02/21/2020 Imaging   IMPRESSION: 1. Stable hepatic metastatic disease. 2. Aortic atherosclerosis (ICD10-I70.0). Coronary artery calcification. 3.  Emphysema (ICD10-J43.9).   05/19/2020 Imaging   CT CAP  IMPRESSION: 1. Multiple partially calcified liver metastases are again noted. With the exception of a small lesion in segment 7/8 lesions are not significantly changed in the interval. No new liver lesions identified. 2. Coronary artery atherosclerotic calcifications. 3. Aortic atherosclerosis. 4. 2 mm right upper lobe lung nodule identified.  Unchanged.   Aortic Atherosclerosis (ICD10-I70.0).   11/17/2020 Imaging   CT CAP  IMPRESSION: 1. Partially calcified lesions throughout the liver are stable accounting for differences in technique, contrasted imaging on today's study is compared to noncontrast imaging on the prior. 2. No new hepatic lesions. 3. Tiny 3 mm pulmonary nodule in the RIGHT upper lobe unchanged since the prior study. Attention on follow-up. 4. Mild fullness of RIGHT paratracheal nodal tissue is minimally increased and borderline enlarged, attention on follow-up. 5. RIGHT lower quadrant colostomy. 6. 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 7. Aortic atherosclerosis.   06/09/2021 Imaging   EXAM: CT CHEST, ABDOMEN, AND PELVIS WITH CONTRAST  IMPRESSION: Stable calcified liver metastases.   Stable mildly enlarged right paratracheal lymph node.   No evidence of new or progressive metastatic disease.   Aortic Atherosclerosis (ICD10-I70.0).  09/14/2021 PET scan   IMPRESSION: Multiple hypermetabolic and partially calcified liver  metastases, without significant change compared to most recent CT.   Mild hypermetabolic mediastinal and bilateral hilar lymphadenopathy, also without significant change.   No evidence of new or progressive metastatic disease.     Electronically Signed   By: Danae Orleans M.D.   On: 09/15/2021 11:04   04/15/2022 Imaging    IMPRESSION: 1. Several previously noted partially calcified hepatic metastases appear grossly stable compared to the prior examination. No new hepatic lesions or other signs of metastatic disease noted elsewhere in the chest, abdomen or pelvis. 2. Aortic atherosclerosis, in addition to left main and three-vessel coronary artery disease. Please note that although the presence of coronary artery calcium documents the presence of coronary artery disease, the severity of this disease and any potential stenosis cannot be assessed on this non-gated CT examination. Assessment for potential risk factor modification, dietary therapy or pharmacologic therapy may be warranted, if clinically indicated. 3. There are calcifications of the mitral annulus. Echocardiographic correlation for evaluation of potential valvular dysfunction may be warranted if clinically indicated. 4. Additional incidental findings, similar to prior studies, as above.   07/15/2022 Imaging    IMPRESSION: 1. Stable exam. No new or progressive findings in the abdomen or pelvis to suggest worsening disease. 2. No substantial change in calcified liver metastases. 3. Right abdominal transverse end colostomy with parastomal herniation of fat and colon proximal to the stoma. 4. Probable left-sided uterine fibroid measuring on the order of 3.1 cm and displacing the endometrial stripe to the right. Pelvic ultrasound could be used to further evaluate as clinically warranted. 5.  Aortic Atherosclerosis (ICD10-I70.0).        Discussed the use of AI scribe software for clinical note transcription with  the patient, who gave verbal consent to proceed.  History of Present Illness The patient, a 63 year old with a history of colon cancer with liver metastasis, presents for a routine follow-up. She reports feeling well overall with no new concerns. She continues to experience dryness and darkening of the hands, likely secondary to chemotherapy. She also reports mild neuropathy in the foot, which she describes as manageable. The patient denies any pain or discomfort. She has been on the current treatment regimen since October 2020.     All other systems were reviewed with the patient and are negative.  MEDICAL HISTORY:  Past Medical History:  Diagnosis Date   Anemia    low iron   Colon cancer (HCC) 01/2019   Hypertension 01/23/2019   Personal history of chemotherapy 01/2019   colon CA   SBO (small bowel obstruction) (HCC) 01/23/2019    SURGICAL HISTORY: Past Surgical History:  Procedure Laterality Date   CESAREAN SECTION     x2   COLOSTOMY N/A 01/24/2019   Procedure: End Loop Colostomy;  Surgeon: Berna Bue, MD;  Location: [facility] OR;  Service: General;  Laterality: N/A;   PARTIAL COLECTOMY N/A 01/24/2019   Procedure: PARTIAL COLECTOMY;  Surgeon: Berna Bue, MD;  Location: [facility] OR;  Service: General;  Laterality: N/A;   PORTACATH PLACEMENT Right 02/28/2019   Procedure: INSERTION PORT-A-CATH WITH ULTRASOUND GUIDANCE;  Surgeon: Berna Bue, MD;  Location: [facility] OR;  Service: General;  Laterality: Right;    I have reviewed the social history and family history with the patient and they are unchanged from previous note.  ALLERGIES:  has no known allergies.  MEDICATIONS:  Current Outpatient Medications  Medication Sig Dispense Refill  acetaminophen (TYLENOL) 325 MG tablet Take 2 tablets (650 mg total) by mouth every 6 (six) hours as needed.     amLODipine (NORVASC) 10 MG tablet TAKE 1 TABLET BY MOUTH EVERY DAY 90 tablet 1   benzonatate (TESSALON) 100 MG capsule Take 1  capsule (100 mg total) by mouth 2 (two) times daily as needed for cough. 20 capsule 0   capecitabine (XELODA) 500 MG tablet Take 4 tablets (2,000 mg total) by mouth 2 (two) times daily after a meal. Take for 14 days, then off for 7 days. 112 tablet 2   ferrous sulfate 325 (65 FE) MG tablet TAKE 1 TABLET BY MOUTH TWICE A DAY WITH FOOD 180 tablet 1   gabapentin (NEURONTIN) 300 MG capsule Take 1 capsule (300 mg total) by mouth 2 (two) times daily. 60 capsule 3   hydrALAZINE (APRESOLINE) 25 MG tablet TAKE 1 TABLET BY MOUTH EVERY 8 HOURS 270 tablet 0   hydrochlorothiazide (HYDRODIURIL) 12.5 MG tablet Take 1 tablet (12.5 mg total) by mouth daily. 90 tablet 1   hydrocortisone (ANUSOL-HC) 2.5 % rectal cream Place 1 application rectally 2 (two) times daily. 30 g 0   Ibuprofen 200 MG CAPS Take 400 mg by mouth daily as needed (pain).     isosorbide mononitrate (IMDUR) 30 MG 24 hr tablet TAKE 1 TABLET BY MOUTH EVERY DAY 90 tablet 1   levothyroxine (SYNTHROID) 25 MCG tablet Take 1 tablet (25 mcg total) by mouth daily. 90 tablet 1   lidocaine (LMX) 4 % cream Apply 1 application topically 3 (three) times daily as needed. 30 g 0   lidocaine-prilocaine (EMLA) cream APPLY 1 APPLICATION TOPICALLY AS NEEDED. 30 g 1   lisinopril (ZESTRIL) 40 MG tablet TAKE 1 TABLET BY MOUTH EVERY DAY 90 tablet 1   ondansetron (ZOFRAN) 8 MG tablet Take 1 tablet (8 mg total) by mouth every 8 (eight) hours as needed for nausea or vomiting. 20 tablet 2   polycarbophil (FIBERCON) 625 MG tablet Take 1 tablet (625 mg total) by mouth daily. 30 tablet 0   prochlorperazine (COMPAZINE) 10 MG tablet Take 1 tablet (10 mg total) by mouth every 6 (six) hours as needed for nausea or vomiting. 30 tablet 2   No current facility-administered medications for this visit.   Facility-Administered Medications Ordered in Other Visits  Medication Dose Route Frequency Provider Last Rate Last Admin   sodium chloride flush (NS) 0.9 % injection 10 mL  10 mL  Intracatheter PRN Malachy Mood, MD   10 mL at 06/06/20 1051   sodium chloride flush (NS) 0.9 % injection 10 mL  10 mL Intracatheter PRN Malachy Mood, MD   10 mL at 07/14/23 1546    PHYSICAL EXAMINATION: ECOG PERFORMANCE STATUS: 0 - Asymptomatic  Vitals:   07/14/23 1416  BP: (!) 120/58  Pulse: 63  Resp: 17  Temp: (!) 97.3 F (36.3 C)  SpO2: 97%   Wt Readings from Last 3 Encounters:  07/14/23 240 lb 9.6 oz (109.1 kg)  06/02/23 241 lb 3.2 oz (109.4 kg)  04/20/23 242 lb 14.4 oz (110.2 kg)     GENERAL:alert, no distress and comfortable SKIN: skin color, texture, turgor are normal, no rashes or significant lesions EYES: normal, Conjunctiva are pink and non-injected, sclera clear NECK: supple, thyroid normal size, non-tender, without nodularity LYMPH:  no palpable lymphadenopathy in the cervical, axillary  LUNGS: clear to auscultation and percussion with normal breathing effort HEART: regular rate & rhythm and no murmurs and no lower extremity  edema ABDOMEN:abdomen soft, non-tender and normal bowel sounds Musculoskeletal:no cyanosis of digits and no clubbing  NEURO: alert & oriented x 3 with fluent speech, no focal motor/sensory deficits  Physical Exam    LABORATORY DATA:  I have reviewed the data as listed    Latest Ref Rng & Units 07/14/2023    1:52 PM 06/23/2023    2:10 PM 06/02/2023    2:18 PM  CBC  WBC 4.0 - 10.5 K/uL 7.3  7.4  6.5   Hemoglobin 12.0 - 15.0 g/dL 16.1  09.6  04.5   Hematocrit 36.0 - 46.0 % 33.3  33.3  34.6   Platelets 150 - 400 K/uL 176  164  152         Latest Ref Rng & Units 07/14/2023    1:52 PM 06/23/2023    2:10 PM 06/02/2023    2:18 PM  CMP  Glucose 70 - 99 mg/dL 76  76  409   BUN 8 - 23 mg/dL 30  27  26    Creatinine 0.44 - 1.00 mg/dL 8.11  9.14  7.82   Sodium 135 - 145 mmol/L 136  133  135   Potassium 3.5 - 5.1 mmol/L 4.3  4.3  4.7   Chloride 98 - 111 mmol/L 110  109  110   CO2 22 - 32 mmol/L 22  21  21    Calcium 8.9 - 10.3 mg/dL 9.6  9.4  9.6    Total Protein 6.5 - 8.1 g/dL 8.0  7.6  7.8   Total Bilirubin 0.0 - 1.2 mg/dL 0.5  0.5  0.7   Alkaline Phos 38 - 126 U/L 92  73  78   AST 15 - 41 U/L 19  18  21    ALT 0 - 44 U/L 10  11  13        RADIOGRAPHIC STUDIES: I have personally reviewed the radiological images as listed and agreed with the findings in the report. No results found.    Orders Placed This Encounter  Procedures   [facility] (Access)-[facility] ONLY    Standing Status:   Standing    Number of Occurrences:   50    Expiration Date:   07/14/2024   All questions were answered. The patient knows to call the clinic with any problems, questions or concerns. No barriers to learning was detected. The total time spent in the appointment was 25 minutes.     Malachy Mood, MD 07/14/2023

## 2023-07-14 NOTE — Assessment & Plan Note (Signed)
 [id number] stage IV with liver and nodal metastasis, MSS, KRAS G12S(+)  -Diagnosed in 01/2019 after emergent colectomy and liver biopsy. 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  -Started first line chemo on 03/12/2019, received 5FU/leuc with first 2 cycles for large open abdominal wound after surgery. She started full dose FOLFOX and avastin with cycle 2. Due to neuropathy Oxaliplatin was stopped after 09/24/19.  --For convenience she was switched to oral chemo Xeloda 2 weeks on/1 week off and Bev every 3 weeks in 01/21/20. She is tolerating well -Her restaging CT scan from 03/11/2023 showed stable liver metastasis, no other new lesions. I personally reviewed with pt  -She is clinically doing well, will continue current therapy. -Since she is tolerating treatment very well, I will see her every other treatment (6 week)

## 2023-07-14 NOTE — Patient Instructions (Signed)

## 2023-07-18 ENCOUNTER — Other Ambulatory Visit: Payer: Self-pay | Admitting: Internal Medicine

## 2023-07-18 DIAGNOSIS — I10 Essential (primary) hypertension: Secondary | ICD-10-CM

## 2023-07-21 DIAGNOSIS — T8131XD Disruption of external operation (surgical) wound, not elsewhere classified, subsequent encounter: Secondary | ICD-10-CM | POA: Diagnosis not present

## 2023-07-21 DIAGNOSIS — K56609 Unspecified intestinal obstruction, unspecified as to partial versus complete obstruction: Secondary | ICD-10-CM | POA: Diagnosis not present

## 2023-07-21 DIAGNOSIS — Z933 Colostomy status: Secondary | ICD-10-CM | POA: Diagnosis not present

## 2023-07-21 DIAGNOSIS — S31109A Unspecified open wound of abdominal wall, unspecified quadrant without penetration into peritoneal cavity, initial encounter: Secondary | ICD-10-CM | POA: Diagnosis not present

## 2023-07-27 ENCOUNTER — Encounter: Payer: Self-pay | Admitting: Hematology

## 2023-08-04 ENCOUNTER — Inpatient Hospital Stay: Payer: Medicare HMO

## 2023-08-04 ENCOUNTER — Inpatient Hospital Stay: Payer: Medicare HMO | Attending: Hematology

## 2023-08-04 ENCOUNTER — Encounter: Payer: Self-pay | Admitting: Hematology

## 2023-08-04 ENCOUNTER — Inpatient Hospital Stay (HOSPITAL_BASED_OUTPATIENT_CLINIC_OR_DEPARTMENT_OTHER): Payer: Medicare HMO | Admitting: Hematology

## 2023-08-04 VITALS — BP 142/60 | HR 60 | Temp 97.9°F | Resp 18 | Ht 68.0 in | Wt 236.1 lb

## 2023-08-04 DIAGNOSIS — Z7189 Other specified counseling: Secondary | ICD-10-CM

## 2023-08-04 DIAGNOSIS — Z95828 Presence of other vascular implants and grafts: Secondary | ICD-10-CM

## 2023-08-04 DIAGNOSIS — C778 Secondary and unspecified malignant neoplasm of lymph nodes of multiple regions: Secondary | ICD-10-CM | POA: Diagnosis not present

## 2023-08-04 DIAGNOSIS — C787 Secondary malignant neoplasm of liver and intrahepatic bile duct: Secondary | ICD-10-CM

## 2023-08-04 DIAGNOSIS — Z79899 Other long term (current) drug therapy: Secondary | ICD-10-CM | POA: Diagnosis not present

## 2023-08-04 DIAGNOSIS — Z5112 Encounter for antineoplastic immunotherapy: Secondary | ICD-10-CM | POA: Diagnosis not present

## 2023-08-04 DIAGNOSIS — C184 Malignant neoplasm of transverse colon: Secondary | ICD-10-CM | POA: Insufficient documentation

## 2023-08-04 DIAGNOSIS — C189 Malignant neoplasm of colon, unspecified: Secondary | ICD-10-CM

## 2023-08-04 LAB — CBC WITH DIFFERENTIAL (CANCER CENTER ONLY)
Abs Immature Granulocytes: 0.01 10*3/uL (ref 0.00–0.07)
Basophils Absolute: 0 10*3/uL (ref 0.0–0.1)
Basophils Relative: 1 %
Eosinophils Absolute: 0.2 10*3/uL (ref 0.0–0.5)
Eosinophils Relative: 3 %
HCT: 34.2 % — ABNORMAL LOW (ref 36.0–46.0)
Hemoglobin: 11.4 g/dL — ABNORMAL LOW (ref 12.0–15.0)
Immature Granulocytes: 0 %
Lymphocytes Relative: 30 %
Lymphs Abs: 1.9 10*3/uL (ref 0.7–4.0)
MCH: 32.9 pg (ref 26.0–34.0)
MCHC: 33.3 g/dL (ref 30.0–36.0)
MCV: 98.6 fL (ref 80.0–100.0)
Monocytes Absolute: 0.4 10*3/uL (ref 0.1–1.0)
Monocytes Relative: 6 %
Neutro Abs: 4 10*3/uL (ref 1.7–7.7)
Neutrophils Relative %: 60 %
Platelet Count: 164 10*3/uL (ref 150–400)
RBC: 3.47 MIL/uL — ABNORMAL LOW (ref 3.87–5.11)
RDW: 16 % — ABNORMAL HIGH (ref 11.5–15.5)
WBC Count: 6.5 10*3/uL (ref 4.0–10.5)
nRBC: 0 % (ref 0.0–0.2)

## 2023-08-04 LAB — COMPREHENSIVE METABOLIC PANEL WITH GFR
ALT: 12 U/L (ref 0–44)
AST: 20 U/L (ref 15–41)
Albumin: 3.9 g/dL (ref 3.5–5.0)
Alkaline Phosphatase: 98 U/L (ref 38–126)
Anion gap: 4 — ABNORMAL LOW (ref 5–15)
BUN: 23 mg/dL (ref 8–23)
CO2: 24 mmol/L (ref 22–32)
Calcium: 9.5 mg/dL (ref 8.9–10.3)
Chloride: 109 mmol/L (ref 98–111)
Creatinine, Ser: 0.95 mg/dL (ref 0.44–1.00)
GFR, Estimated: >60 mL/min (ref >60)
Glucose, Bld: 85 mg/dL (ref 70–99)
Potassium: 4.1 mmol/L (ref 3.5–5.1)
Sodium: 137 mmol/L (ref 135–145)
Total Bilirubin: 0.4 mg/dL (ref 0.0–1.2)
Total Protein: 7.7 g/dL (ref 6.5–8.1)

## 2023-08-04 LAB — TOTAL PROTEIN, URINE DIPSTICK: Protein, ur: NEGATIVE mg/dL

## 2023-08-04 MED ORDER — SODIUM CHLORIDE 0.9% FLUSH
10.0000 mL | INTRAVENOUS | Status: DC | PRN
  Administered 2023-08-04: 10 mL

## 2023-08-04 MED ORDER — SODIUM CHLORIDE 0.9 % IV SOLN: Freq: Once | INTRAVENOUS | Status: AC

## 2023-08-04 MED ORDER — HEPARIN SOD (PORK) LOCK FLUSH 100 UNIT/ML IV SOLN
500.0000 [IU] | Freq: Once | INTRAVENOUS | Status: AC | PRN
  Administered 2023-08-04: 500 [IU]

## 2023-08-04 MED ORDER — SODIUM CHLORIDE 0.9 % IV SOLN
7.5000 mg/kg | Freq: Once | INTRAVENOUS | Status: AC
  Administered 2023-08-04: 800 mg via INTRAVENOUS
  Filled 2023-08-04: qty 32

## 2023-08-04 NOTE — Assessment & Plan Note (Signed)
 [id number] stage IV with liver and nodal metastasis, MSS, KRAS G12S(+)  -Diagnosed in 01/2019 after emergent colectomy and liver biopsy. 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  -Started first line chemo on 03/12/2019, received 5FU/leuc with first 2 cycles for large open abdominal wound after surgery. She started full dose FOLFOX and avastin with cycle 2. Due to neuropathy Oxaliplatin was stopped after 09/24/19.  --For convenience she was switched to oral chemo Xeloda 2 weeks on/1 week off and Bev every 3 weeks in 01/21/20. She is tolerating well -Her restaging CT scan from 03/11/2023 showed stable liver metastasis, no other new lesions. I personally reviewed with pt  -She is clinically doing well, will continue current therapy. -Since she is tolerating treatment very well, I will see her every other treatment (6 week)

## 2023-08-04 NOTE — Patient Instructions (Signed)
 [facility] - A DEPT OF [facility]  Discharge Instructions: Thank you for choosing [facility] to provide your oncology and hematology care.   If you have a lab appointment with the Cancer Center, please go directly to the Cancer Center and check in at the registration area.   Wear comfortable clothing and clothing appropriate for easy access to any Portacath or PICC line.   We strive to give you quality time with your provider. You may need to reschedule your appointment if you arrive late (15 or more minutes).  Arriving late affects you and other patients whose appointments are after yours.  Also, if you miss three or more appointments without notifying the office, you may be dismissed from the clinic at the provider's discretion.      For prescription refill requests, have your pharmacy contact our office and allow 72 hours for refills to be completed.    Today you received the following chemotherapy and/or immunotherapy agents: bevacizumab      To help prevent nausea and vomiting after your treatment, we encourage you to take your nausea medication as directed.  BELOW ARE SYMPTOMS THAT SHOULD BE REPORTED IMMEDIATELY: *FEVER GREATER THAN 100.4 F (38 C) OR HIGHER *CHILLS OR SWEATING *NAUSEA AND VOMITING THAT IS NOT CONTROLLED WITH YOUR NAUSEA MEDICATION *UNUSUAL SHORTNESS OF BREATH *UNUSUAL BRUISING OR BLEEDING *URINARY PROBLEMS (pain or burning when urinating, or frequent urination) *BOWEL PROBLEMS (unusual diarrhea, constipation, pain near the anus) TENDERNESS IN MOUTH AND THROAT WITH OR WITHOUT PRESENCE OF ULCERS (sore throat, sores in mouth, or a toothache) UNUSUAL RASH, SWELLING OR PAIN  UNUSUAL VAGINAL DISCHARGE OR ITCHING   Items with * indicate a potential emergency and should be followed up as soon as possible or go to the Emergency Department if any problems should occur.  Please show the CHEMOTHERAPY ALERT CARD or  IMMUNOTHERAPY ALERT CARD at check-in to the Emergency Department and triage nurse.  Should you have questions after your visit or need to cancel or reschedule your appointment, please contact [facility] - A DEPT OF [facility]  Dept: [phone]  and follow the prompts.  Office hours are 8:00 a.m. to 4:30 p.m. Monday - Friday. Please note that voicemails left after 4:00 p.m. may not be returned until the following business day.  We are closed weekends and major holidays. You have access to a nurse at all times for urgent questions. Please call the main number to the clinic Dept: [phone] and follow the prompts.   For any non-urgent questions, you may also contact your provider using MyChart. We now offer e-Visits for anyone 81 and older to request care online for non-urgent symptoms. For details visit [email or web address].   Also download the MyChart app! Go to the app store, search "MyChart", open the app, select [facility], and log in with your MyChart username and password.

## 2023-08-05 ENCOUNTER — Encounter: Payer: Self-pay | Admitting: Hematology

## 2023-08-05 NOTE — Progress Notes (Signed)
 [facility]   [phone] [phone]   Clinic Follow up Note   Patient Care Team: Zilphia Hilt, Charyl Coppersmith, MD as PCP - General (Internal Medicine) Eilleen Grates, MD as PCP - Cardiology (Cardiology) Adalberto Acton, MD as Consulting Physician (General Surgery) Sonja Milbank, MD as Consulting Physician (Hematology) [name], [name], NP as Nurse Practitioner (Nurse Practitioner)  Date of Service: 08/04/2023  CHIEF COMPLAINT: f/u of colon cancer  CURRENT THERAPY:  Bevacizumab  and Xeloda   Oncology History   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  -Started first line chemo on 03/12/2019, received 5FU/leuc with first 2 cycles for large open abdominal wound after surgery. She started full dose FOLFOX and avastin  with cycle 2. Due to neuropathy Oxaliplatin  was stopped after 09/24/19.  --For convenience she was switched to oral chemo Xeloda  2 weeks on/1 week off and Bev every 3 weeks in 01/21/20. She is tolerating well -Her restaging CT scan from 03/11/2023 showed stable liver metastasis, no other new lesions. I personally reviewed with pt  -She is clinically doing well, will continue current therapy. -Since she is tolerating treatment very well, I will see her every other treatment (6 week)  Assessment & Plan Metastatic colon cancer Metastatic colon cancer with slight progression in the liver on the July 07, 2023 CT scan. Majority of the disease remains stable. Tumor marker levels are stable, ranging between 150-160. Current treatment includes bevacizumab  every three weeks. Potential side effects include hypertension, which is not currently significant. The decision to continue bevacizumab  is based on overall disease stability and slight liver  progression, warranting closer monitoring with upcoming scans. - Continue bevacizumab  infusions every three weeks. - Schedule next infusion cycles for May 22 and June 12. - Repeat CT scan in late June or early July to monitor for significant changes. - Monitor blood pressure due to potential bevacizumab  side effects.  Mild anemia Mild anemia with hemoglobin level of 11.4, not causing significant issues.  PLAN - Lab reviewed, adequate for treatment, will proceed bevacizumab  today and continue every 3 weeks - Continue Xeloda  at current dose, she is tolerating well. 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 Bowel-gas pattern consistent with small bowel obstruction. No free air. 2. No acute chest findings.   01/23/2019 Imaging   CT AP IMPRESSION: Obstructing mid transverse colonic mass with mild regional adenopathy and hepatic metastatic disease. The mass likely extends through the serosa; no ascites or peritoneal nodularity.   01/24/2019 Surgery   Surgeon: Adalberto Acton MD Assistant: Fairy Homer PA-C Procedure performed: Transverse colectomy with end colostomy, liver biopsy Procedure classification: URGENT/EMERGENT Preop diagnosis: Obstructing, metastatic transverse colon mass Post-op diagnosis/intraop findings: Same   01/24/2019 Pathology Results   FINAL MICROSCOPIC DIAGNOSIS:   A. COLON, TRANSVERSE, RESECTION:  Colonic adenocarcinoma, 5 cm.  Carcinoma extends into pericolonic connective tissue and focally to  serosal surface.  Margins not involved.  Metastatic carcinoma in one of thirteen lymph  nodes (1/13).   B. LIVER NODULE, LEFT, BIOPSY:  Metastatic adenocarcinoma.    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 Hypermetabolic metastatic disease in the liver and mediastinal/hilar/axillary lymph nodes. 2. Focal hypermetabolism in the rectum. Continued attention on follow-up exams is warranted. 3. Focal hypermetabolism medial to the right adrenal gland may be within a metastatic lymph node, better visualized on 01/23/2019. 4. Aortic atherosclerosis (ICD10-170.0). Coronary artery calcification.   03/12/2019 -  Chemotherapy   She started 5FU q2weeks on 03/12/19 for 2 cycles. She started full dose FOLFOX with Avastin  on 04/09/19. Oxaliplatin  dose reduced repeatedly due to neuropathy C12 and held since C16 on 10/09/19. Now on maintenance Avastin  and 5FU q2weeks since 10/09/19       -Maintenance change to maintenance xeloda  2000 mg BID days 1-14 q21 days and q3 weeks Zirabev  (15 mg/kg) starting 01/21/20. First cycle was taken 1000mg  BID due to misunderstanding.    05/31/2019 Imaging   Restaging CT CAP IMPRESSION: 1. Similar to mild interval decrease in size of multiple hepatic lesions, partially calcified. 2. 2 mm right upper lobe pulmonary nodule. Recommend attention on follow-up. 3. Emphysema and aortic atherosclerosis.   08/23/2019 Imaging   CT CAP w contrast  IMPRESSION: 1. The dominant peripheral right liver metastasis has mildly increased. Other smaller liver metastases are stable. 2. Otherwise no new or progressive metastatic disease in the chest, abdomen or pelvis. 3. Aortic Atherosclerosis (ICD10-I70.0) and Emphysema (ICD10-J43.9).   11/27/2019 Imaging   CT CAP w contrast  IMPRESSION: Status post transverse  colectomy with right mid abdominal colostomy.   Mildly progressive hepatic metastases, as above.   No evidence of metastatic disease in the chest. Small mediastinal lymph nodes are within normal limits.   Additional stable ancillary findings as above.   01/21/2020 -  Chemotherapy   Patient is on Treatment Plan : COLORECTAL Bevacizumab  q21d     02/21/2020 Imaging   IMPRESSION: 1. Stable hepatic metastatic disease. 2. Aortic atherosclerosis (ICD10-I70.0). Coronary artery calcification. 3.  Emphysema (ICD10-J43.9).   05/19/2020 Imaging   CT CAP  IMPRESSION: 1. Multiple partially calcified liver metastases are again noted. With the exception of a small lesion in segment 7/8 lesions are not significantly changed in the interval. No new liver lesions identified. 2. Coronary artery atherosclerotic calcifications. 3. Aortic atherosclerosis. 4. 2 mm right upper lobe lung nodule identified.  Unchanged.   Aortic Atherosclerosis (ICD10-I70.0).   11/17/2020 Imaging   CT CAP  IMPRESSION: 1. Partially calcified lesions throughout the liver are stable accounting for differences in technique, contrasted imaging on today's study is compared to noncontrast imaging on the prior. 2. No new hepatic lesions. 3. Tiny 3 mm pulmonary nodule in the RIGHT upper lobe unchanged since the prior study. Attention on follow-up. 4. Mild fullness of RIGHT paratracheal nodal tissue is minimally increased and borderline enlarged, attention on follow-up. 5. RIGHT lower quadrant colostomy. 6. 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 7. Aortic atherosclerosis.   06/09/2021 Imaging   EXAM: CT CHEST, ABDOMEN, AND PELVIS WITH CONTRAST  IMPRESSION: Stable calcified liver metastases.   Stable mildly enlarged right paratracheal lymph node.  No evidence of new or progressive metastatic  disease.   Aortic Atherosclerosis (ICD10-I70.0).   09/14/2021 PET scan   IMPRESSION: Multiple hypermetabolic and partially calcified liver metastases, without significant change compared to most recent CT.   Mild hypermetabolic mediastinal and bilateral hilar lymphadenopathy, also without significant change.   No evidence of new or progressive metastatic disease.     Electronically Signed   By: Marlyce Sine M.D.   On: 09/15/2021 11:04   04/15/2022 Imaging    IMPRESSION: 1. Several previously noted partially calcified hepatic metastases appear grossly stable compared to the prior examination. No new hepatic lesions or other signs of metastatic disease noted elsewhere in the chest, abdomen or pelvis. 2. Aortic atherosclerosis, in addition to left main and three-vessel coronary artery disease. Please note that although the presence of coronary artery calcium  documents the presence of coronary artery disease, the severity of this disease and any potential stenosis cannot be assessed on this non-gated CT examination. Assessment for potential risk factor modification, dietary therapy or pharmacologic therapy may be warranted, if clinically indicated. 3. There are calcifications of the mitral annulus. Echocardiographic correlation for evaluation of potential valvular dysfunction may be warranted if clinically indicated. 4. Additional incidental findings, similar to prior studies, as above.   07/15/2022 Imaging    IMPRESSION: 1. Stable exam. No new or progressive findings in the abdomen or pelvis to suggest worsening disease. 2. No substantial change in calcified liver metastases. 3. Right abdominal transverse end colostomy with parastomal herniation of fat and colon proximal to the stoma. 4. Probable left-sided uterine fibroid measuring on the order of 3.1 cm and displacing the endometrial stripe to the right. Pelvic ultrasound could be used to further evaluate as  clinically warranted. 5.  Aortic Atherosclerosis (ICD10-I70.0).        Discussed the use of AI scribe software for clinical note transcription with the patient, who gave verbal consent to proceed.  History of Present Illness Casey Black is a 63 year old female with metastatic colon cancer who presents for follow-up.  She is currently receiving bevacizumab  for metastatic colon cancer. The latest CT scan from July 07, 2023, indicates slight growth in the liver, but overall disease stability. Tumor markers remain stable, ranging between 150-160. She has mild anemia with a hemoglobin level of 11.4, which is not a significant concern at this time. Occasional mild back discomfort and leg swelling are present, with the edema being non-painful. Kidney function is normal, showing improvement from previous assessments.     All other systems were reviewed with the patient and are negative.  MEDICAL HISTORY:  Past Medical History:  Diagnosis Date   Anemia    low iron   Colon cancer (HCC) 01/2019   Hypertension 01/23/2019   Personal history of chemotherapy 01/2019   colon CA   SBO (small bowel obstruction) (HCC) 01/23/2019    SURGICAL HISTORY: Past Surgical History:  Procedure Laterality Date   CESAREAN SECTION     x2   COLOSTOMY N/A 01/24/2019   Procedure: End Loop Colostomy;  Surgeon: Adalberto Acton, MD;  Location: [facility] OR;  Service: General;  Laterality: N/A;   PARTIAL COLECTOMY N/A 01/24/2019   Procedure: PARTIAL COLECTOMY;  Surgeon: Adalberto Acton, MD;  Location: [facility] OR;  Service: General;  Laterality: N/A;   PORTACATH PLACEMENT Right 02/28/2019   Procedure: INSERTION PORT-A-CATH WITH ULTRASOUND GUIDANCE;  Surgeon: Adalberto Acton, MD;  Location: [facility] OR;  Service: General;  Laterality: Right;    I  have reviewed the social history and family history with the patient and they are unchanged from previous note.  ALLERGIES:  has no known allergies.  MEDICATIONS:  Current  Outpatient Medications  Medication Sig Dispense Refill   acetaminophen  (TYLENOL ) 325 MG tablet Take 2 tablets (650 mg total) by mouth every 6 (six) hours as needed.     amLODipine  (NORVASC ) 10 MG tablet TAKE 1 TABLET BY MOUTH EVERY DAY 90 tablet 1   benzonatate  (TESSALON ) 100 MG capsule Take 1 capsule (100 mg total) by mouth 2 (two) times daily as needed for cough. 20 capsule 0   capecitabine  (XELODA ) 500 MG tablet Take 4 tablets (2,000 mg total) by mouth 2 (two) times daily after a meal. Take for 14 days, then off for 7 days. 112 tablet 2   ferrous sulfate  325 (65 FE) MG tablet TAKE 1 TABLET BY MOUTH TWICE A DAY WITH FOOD 180 tablet 1   gabapentin  (NEURONTIN ) 300 MG capsule Take 1 capsule (300 mg total) by mouth 2 (two) times daily. 60 capsule 3   hydrALAZINE  (APRESOLINE ) 25 MG tablet TAKE 1 TABLET BY MOUTH EVERY 8 HOURS 270 tablet 0   hydrochlorothiazide  (HYDRODIURIL ) 12.5 MG tablet TAKE 1 TABLET BY MOUTH EVERY DAY 90 tablet 1   hydrocortisone  (ANUSOL -HC) 2.5 % rectal cream Place 1 application rectally 2 (two) times daily. 30 g 0   Ibuprofen  200 MG CAPS Take 400 mg by mouth daily as needed (pain).     isosorbide  mononitrate (IMDUR ) 30 MG 24 hr tablet TAKE 1 TABLET BY MOUTH EVERY DAY 90 tablet 1   levothyroxine  (SYNTHROID ) 25 MCG tablet Take 1 tablet (25 mcg total) by mouth daily. 90 tablet 1   lidocaine  (LMX) 4 % cream Apply 1 application topically 3 (three) times daily as needed. 30 g 0   lidocaine -prilocaine  (EMLA ) cream APPLY 1 APPLICATION TOPICALLY AS NEEDED. 30 g 1   lisinopril  (ZESTRIL ) 40 MG tablet TAKE 1 TABLET BY MOUTH EVERY DAY 90 tablet 1   ondansetron  (ZOFRAN ) 8 MG tablet Take 1 tablet (8 mg total) by mouth every 8 (eight) hours as needed for nausea or vomiting. 20 tablet 2   polycarbophil (FIBERCON) 625 MG tablet Take 1 tablet (625 mg total) by mouth daily. 30 tablet 0   prochlorperazine  (COMPAZINE ) 10 MG tablet Take 1 tablet (10 mg total) by mouth every 6 (six) hours as needed for  nausea or vomiting. 30 tablet 2   No current facility-administered medications for this visit.   Facility-Administered Medications Ordered in Other Visits  Medication Dose Route Frequency Provider Last Rate Last Admin   sodium chloride  flush (NS) 0.9 % injection 10 mL  10 mL Intracatheter PRN Sonja Arnold, MD   10 mL at 06/06/20 1051    PHYSICAL EXAMINATION: ECOG PERFORMANCE STATUS: 1 - Symptomatic but completely ambulatory  Vitals:   08/04/23 1409  BP: (!) 142/60  Pulse: 60  Resp: 18  Temp: 97.9 F (36.6 C)  SpO2: 100%   Wt Readings from Last 3 Encounters:  08/04/23 236 lb 1.6 oz (107.1 kg)  07/14/23 240 lb 9.6 oz (109.1 kg)  06/02/23 241 lb 3.2 oz (109.4 kg)     GENERAL:alert, no distress and comfortable SKIN: skin color, texture, turgor are normal, no rashes or significant lesions except skin hyperpigmentation in hands EYES: normal, Conjunctiva are pink and non-injected, sclera clear NECK: supple, thyroid  normal size, non-tender, without nodularity LYMPH:  no palpable lymphadenopathy in the cervical, axillary  LUNGS: clear to auscultation and  percussion with normal breathing effort HEART: regular rate & rhythm and no murmurs and no lower extremity edema ABDOMEN:abdomen soft, non-tender and normal bowel sounds Musculoskeletal:no cyanosis of digits and no clubbing  NEURO: alert & oriented x 3 with fluent speech, no focal motor/sensory deficits  Physical Exam    LABORATORY DATA:  I have reviewed the data as listed    Latest Ref Rng & Units 08/04/2023    1:49 PM 07/14/2023    1:52 PM 06/23/2023    2:10 PM  CBC  WBC 4.0 - 10.5 K/uL 6.5  7.3  7.4   Hemoglobin 12.0 - 15.0 g/dL 16.1  09.6  04.5   Hematocrit 36.0 - 46.0 % 34.2  33.3  33.3   Platelets 150 - 400 K/uL 164  176  164         Latest Ref Rng & Units 08/04/2023    1:49 PM 07/14/2023    1:52 PM 06/23/2023    2:10 PM  CMP  Glucose 70 - 99 mg/dL 85  76  76   BUN 8 - 23 mg/dL 23  30  27    Creatinine 0.44 - 1.00  mg/dL 4.09  8.11  9.14   Sodium 135 - 145 mmol/L 137  136  133   Potassium 3.5 - 5.1 mmol/L 4.1  4.3  4.3   Chloride 98 - 111 mmol/L 109  110  109   CO2 22 - 32 mmol/L 24  22  21    Calcium  8.9 - 10.3 mg/dL 9.5  9.6  9.4   Total Protein 6.5 - 8.1 g/dL 7.7  8.0  7.6   Total Bilirubin 0.0 - 1.2 mg/dL 0.4  0.5  0.5   Alkaline Phos 38 - 126 U/L 98  92  73   AST 15 - 41 U/L 20  19  18    ALT 0 - 44 U/L 12  10  11        RADIOGRAPHIC STUDIES: I have personally reviewed the radiological images as listed and agreed with the findings in the report. No results found.    Orders Placed This Encounter  Procedures   CBC with Differential (Cancer Center Only)    Standing Status:   Future    Expected Date:   09/15/2023    Expiration Date:   09/14/2024   Total Protein, Urine dipstick    Standing Status:   Future    Expected Date:   09/15/2023    Expiration Date:   09/14/2024   All questions were answered. The patient knows to call the clinic with any problems, questions or concerns. No barriers to learning was detected. The total time spent in the appointment was 25 minutes.     Sonja McNeal, MD 08/05/2023

## 2023-08-06 ENCOUNTER — Other Ambulatory Visit: Payer: Self-pay

## 2023-08-15 ENCOUNTER — Other Ambulatory Visit: Payer: Self-pay

## 2023-08-15 ENCOUNTER — Other Ambulatory Visit: Payer: Self-pay | Admitting: Internal Medicine

## 2023-08-15 DIAGNOSIS — I10 Essential (primary) hypertension: Secondary | ICD-10-CM

## 2023-08-15 DIAGNOSIS — Z0000 Encounter for general adult medical examination without abnormal findings: Secondary | ICD-10-CM

## 2023-08-18 DIAGNOSIS — Z933 Colostomy status: Secondary | ICD-10-CM | POA: Diagnosis not present

## 2023-08-18 DIAGNOSIS — K56609 Unspecified intestinal obstruction, unspecified as to partial versus complete obstruction: Secondary | ICD-10-CM | POA: Diagnosis not present

## 2023-08-18 DIAGNOSIS — S31109A Unspecified open wound of abdominal wall, unspecified quadrant without penetration into peritoneal cavity, initial encounter: Secondary | ICD-10-CM | POA: Diagnosis not present

## 2023-08-18 DIAGNOSIS — T8131XD Disruption of external operation (surgical) wound, not elsewhere classified, subsequent encounter: Secondary | ICD-10-CM | POA: Diagnosis not present

## 2023-08-19 ENCOUNTER — Other Ambulatory Visit: Payer: Self-pay | Admitting: Hematology

## 2023-08-25 ENCOUNTER — Encounter: Payer: Self-pay | Admitting: Hematology

## 2023-08-25 ENCOUNTER — Inpatient Hospital Stay: Payer: Medicare HMO

## 2023-08-25 VITALS — BP 151/66 | HR 64 | Temp 97.6°F | Resp 18 | Wt 234.2 lb

## 2023-08-25 DIAGNOSIS — C787 Secondary malignant neoplasm of liver and intrahepatic bile duct: Secondary | ICD-10-CM | POA: Diagnosis not present

## 2023-08-25 DIAGNOSIS — Z7189 Other specified counseling: Secondary | ICD-10-CM

## 2023-08-25 DIAGNOSIS — Z5112 Encounter for antineoplastic immunotherapy: Secondary | ICD-10-CM | POA: Diagnosis not present

## 2023-08-25 DIAGNOSIS — Z79899 Other long term (current) drug therapy: Secondary | ICD-10-CM | POA: Diagnosis not present

## 2023-08-25 DIAGNOSIS — Z95828 Presence of other vascular implants and grafts: Secondary | ICD-10-CM

## 2023-08-25 DIAGNOSIS — C184 Malignant neoplasm of transverse colon: Secondary | ICD-10-CM | POA: Diagnosis not present

## 2023-08-25 DIAGNOSIS — C778 Secondary and unspecified malignant neoplasm of lymph nodes of multiple regions: Secondary | ICD-10-CM | POA: Diagnosis not present

## 2023-08-25 DIAGNOSIS — C189 Malignant neoplasm of colon, unspecified: Secondary | ICD-10-CM

## 2023-08-25 LAB — CBC WITH DIFFERENTIAL (CANCER CENTER ONLY)
Abs Immature Granulocytes: 0.01 10*3/uL (ref 0.00–0.07)
Basophils Absolute: 0 10*3/uL (ref 0.0–0.1)
Basophils Relative: 1 %
Eosinophils Absolute: 0.2 10*3/uL (ref 0.0–0.5)
Eosinophils Relative: 3 %
HCT: 33.6 % — ABNORMAL LOW (ref 36.0–46.0)
Hemoglobin: 11.1 g/dL — ABNORMAL LOW (ref 12.0–15.0)
Immature Granulocytes: 0 %
Lymphocytes Relative: 31 %
Lymphs Abs: 2.2 10*3/uL (ref 0.7–4.0)
MCH: 32.8 pg (ref 26.0–34.0)
MCHC: 33 g/dL (ref 30.0–36.0)
MCV: 99.4 fL (ref 80.0–100.0)
Monocytes Absolute: 0.5 10*3/uL (ref 0.1–1.0)
Monocytes Relative: 8 %
Neutro Abs: 3.9 10*3/uL (ref 1.7–7.7)
Neutrophils Relative %: 57 %
Platelet Count: 180 10*3/uL (ref 150–400)
RBC: 3.38 MIL/uL — ABNORMAL LOW (ref 3.87–5.11)
RDW: 15.8 % — ABNORMAL HIGH (ref 11.5–15.5)
WBC Count: 6.9 10*3/uL (ref 4.0–10.5)
nRBC: 0 % (ref 0.0–0.2)

## 2023-08-25 LAB — CMP (CANCER CENTER ONLY)
ALT: 13 U/L (ref 0–44)
AST: 22 U/L (ref 15–41)
Albumin: 4.1 g/dL (ref 3.5–5.0)
Alkaline Phosphatase: 93 U/L (ref 38–126)
Anion gap: 6 (ref 5–15)
BUN: 28 mg/dL — ABNORMAL HIGH (ref 8–23)
CO2: 23 mmol/L (ref 22–32)
Calcium: 9.5 mg/dL (ref 8.9–10.3)
Chloride: 107 mmol/L (ref 98–111)
Creatinine: 1.16 mg/dL — ABNORMAL HIGH (ref 0.44–1.00)
GFR, Estimated: 53 mL/min — ABNORMAL LOW (ref >60)
Glucose, Bld: 99 mg/dL (ref 70–99)
Potassium: 4.2 mmol/L (ref 3.5–5.1)
Sodium: 136 mmol/L (ref 135–145)
Total Bilirubin: 0.6 mg/dL (ref 0.0–1.2)
Total Protein: 7.9 g/dL (ref 6.5–8.1)

## 2023-08-25 LAB — CEA (ACCESS): CEA (CHCC): 231.37 ng/mL — ABNORMAL HIGH (ref 0.00–5.00)

## 2023-08-25 LAB — TOTAL PROTEIN, URINE DIPSTICK: Protein, ur: 30 mg/dL — AB

## 2023-08-25 MED ORDER — HEPARIN SOD (PORK) LOCK FLUSH 100 UNIT/ML IV SOLN
500.0000 [IU] | Freq: Once | INTRAVENOUS | Status: AC | PRN
  Administered 2023-08-25: 500 [IU]

## 2023-08-25 MED ORDER — SODIUM CHLORIDE 0.9% FLUSH
10.0000 mL | INTRAVENOUS | Status: DC | PRN
Start: 2023-08-25 — End: 2023-08-25
  Administered 2023-08-25: 10 mL

## 2023-08-25 MED ORDER — SODIUM CHLORIDE 0.9 % IV SOLN: Freq: Once | INTRAVENOUS | Status: AC

## 2023-08-25 MED ORDER — SODIUM CHLORIDE 0.9 % IV SOLN
7.5000 mg/kg | Freq: Once | INTRAVENOUS | Status: AC
  Administered 2023-08-25: 800 mg via INTRAVENOUS
  Filled 2023-08-25: qty 32

## 2023-08-25 MED ORDER — SODIUM CHLORIDE 0.9% FLUSH
10.0000 mL | Freq: Once | INTRAVENOUS | Status: AC
  Administered 2023-08-25: 10 mL

## 2023-08-25 NOTE — Patient Instructions (Signed)
 [facility] - A DEPT OF [facility]  Discharge Instructions: Thank you for choosing [facility] to provide your oncology and hematology care.   If you have a lab appointment with the Cancer Center, please go directly to the Cancer Center and check in at the registration area.   Wear comfortable clothing and clothing appropriate for easy access to any Portacath or PICC line.   We strive to give you quality time with your provider. You may need to reschedule your appointment if you arrive late (15 or more minutes).  Arriving late affects you and other patients whose appointments are after yours.  Also, if you miss three or more appointments without notifying the office, you may be dismissed from the clinic at the provider's discretion.      For prescription refill requests, have your pharmacy contact our office and allow 72 hours for refills to be completed.    Today you received the following chemotherapy and/or immunotherapy agents: bevacizumab      To help prevent nausea and vomiting after your treatment, we encourage you to take your nausea medication as directed.  BELOW ARE SYMPTOMS THAT SHOULD BE REPORTED IMMEDIATELY: *FEVER GREATER THAN 100.4 F (38 C) OR HIGHER *CHILLS OR SWEATING *NAUSEA AND VOMITING THAT IS NOT CONTROLLED WITH YOUR NAUSEA MEDICATION *UNUSUAL SHORTNESS OF BREATH *UNUSUAL BRUISING OR BLEEDING *URINARY PROBLEMS (pain or burning when urinating, or frequent urination) *BOWEL PROBLEMS (unusual diarrhea, constipation, pain near the anus) TENDERNESS IN MOUTH AND THROAT WITH OR WITHOUT PRESENCE OF ULCERS (sore throat, sores in mouth, or a toothache) UNUSUAL RASH, SWELLING OR PAIN  UNUSUAL VAGINAL DISCHARGE OR ITCHING   Items with * indicate a potential emergency and should be followed up as soon as possible or go to the Emergency Department if any problems should occur.  Please show the CHEMOTHERAPY ALERT CARD or  IMMUNOTHERAPY ALERT CARD at check-in to the Emergency Department and triage nurse.  Should you have questions after your visit or need to cancel or reschedule your appointment, please contact [facility] - A DEPT OF [facility]  Dept: [phone]  and follow the prompts.  Office hours are 8:00 a.m. to 4:30 p.m. Monday - Friday. Please note that voicemails left after 4:00 p.m. may not be returned until the following business day.  We are closed weekends and major holidays. You have access to a nurse at all times for urgent questions. Please call the main number to the clinic Dept: [phone] and follow the prompts.   For any non-urgent questions, you may also contact your provider using MyChart. We now offer e-Visits for anyone 81 and older to request care online for non-urgent symptoms. For details visit [email or web address].   Also download the MyChart app! Go to the app store, search "MyChart", open the app, select [facility], and log in with your MyChart username and password.

## 2023-08-26 DIAGNOSIS — H524 Presbyopia: Secondary | ICD-10-CM | POA: Diagnosis not present

## 2023-08-26 DIAGNOSIS — H5203 Hypermetropia, bilateral: Secondary | ICD-10-CM | POA: Diagnosis not present

## 2023-08-26 DIAGNOSIS — H52223 Regular astigmatism, bilateral: Secondary | ICD-10-CM | POA: Diagnosis not present

## 2023-08-29 ENCOUNTER — Other Ambulatory Visit: Payer: Self-pay | Admitting: Hematology

## 2023-08-29 DIAGNOSIS — C189 Malignant neoplasm of colon, unspecified: Secondary | ICD-10-CM

## 2023-08-29 DIAGNOSIS — Z7189 Other specified counseling: Secondary | ICD-10-CM

## 2023-08-30 ENCOUNTER — Encounter: Payer: Self-pay | Admitting: Podiatry

## 2023-08-30 ENCOUNTER — Ambulatory Visit: Admitting: Podiatry

## 2023-08-30 DIAGNOSIS — L600 Ingrowing nail: Secondary | ICD-10-CM

## 2023-08-30 DIAGNOSIS — M722 Plantar fascial fibromatosis: Secondary | ICD-10-CM | POA: Diagnosis not present

## 2023-08-30 MED ORDER — PREDNISONE 5 MG PO TABS
ORAL_TABLET | ORAL | 0 refills | Status: DC
Start: 2023-08-30 — End: 2023-11-24

## 2023-08-30 NOTE — Patient Instructions (Signed)

## 2023-08-30 NOTE — Progress Notes (Signed)
 Patient presents complaining of pain on the fourth toe on the left with an ingrown nail and pain at the right heel plantarly.  Nail has been bothering her for several weeks and has been getting worse.  Does not remember any injury to it.  The right heel has been bothering her for the past several weeks also has been getting progressively worse.  Pain when she first stands in the morning and later on the day.   Physical exam:  General appearance: Pleasant, and in no acute distress. AOx3.  Vascular: Pedal pulses: DP palpable bilaterally, PT diminished bilaterally.  Moderate edema lower legs bilaterally. Capillary fill time immediate.  Neurological: Negative Tinel's sign tarsal tunnel right, sural nerve right porta pedis porta pedis right.  Light touch intact feet bilaterally.  Normal Achilles reflex bilaterally.  No clonus or spasticity noted.  Complains of some paresthesias  Dermatologic:   Ingrown lateral nail border fourth toe of the left.  Incurvated nail with tenderness along nail fold with some redness and hypertrophy of the nail fold.  Skin normal temperature bilaterally.  Skin thin and atrophic with no hair growth on the lower extremity and some areas of hyperpigmentation.  Musculoskeletal: Tenderness plantar medial calcaneal tubercle right.  Tenderness along proximal one half of the plantar fascial band.  No fibromas noted.  No tenderness with lateral compression of the calcaneus.  Slight tenderness of the posterior aspect of the heel at the Achilles tendon insertion.  Hammertoe deformities 2 through 5 bilaterally  Radiographs: None  Diagnosis: 1.  Plantar fasciitis right. 2.  Ingrown lateral nail border fourth toe left.  Plan: -Established office visit level 3 for evaluation and management.  Modifier 25 distinct separate diagnosis and treatment of plantar fasciitis of the right.  Prescriptions written and exercises given for plantar fasciitis right. -Rx prednisone 5 mg 12-day  taper starting at 30 mg daily on first day - Matrixectomy(s) lateral border fourth toe nail left: Toe anesthetized with 3cc 2:1 mixture 2% Lidocaine  with epinephrine : Sodium Bicarbonate. Surgical site prepped. Digital tourniquet applied.  Avulsion of lateral border performed. Matrixecomy performed with three 30 second applications of phenol to nail matrix. Site irrigated with alcohol.  Tourniquet released with good vascularity noticed in digit.  Applied triple antibiotic to nailbed and applied gauze and Coban dressing.  Return 2 weeks for follow-up plantar fasciitis fasciitis right and nail POV left.  Radiographs 3 views right foot next visit

## 2023-08-31 ENCOUNTER — Other Ambulatory Visit: Payer: Self-pay

## 2023-09-06 ENCOUNTER — Encounter: Payer: Self-pay | Admitting: Hematology

## 2023-09-13 ENCOUNTER — Ambulatory Visit (INDEPENDENT_AMBULATORY_CARE_PROVIDER_SITE_OTHER): Admitting: Podiatry

## 2023-09-13 ENCOUNTER — Encounter: Payer: Self-pay | Admitting: Podiatry

## 2023-09-13 ENCOUNTER — Ambulatory Visit (INDEPENDENT_AMBULATORY_CARE_PROVIDER_SITE_OTHER)

## 2023-09-13 DIAGNOSIS — M7731 Calcaneal spur, right foot: Secondary | ICD-10-CM

## 2023-09-13 DIAGNOSIS — M722 Plantar fascial fibromatosis: Secondary | ICD-10-CM

## 2023-09-13 MED ORDER — MUPIROCIN 2 % EX OINT: 1.0000 | TOPICAL_OINTMENT | Freq: Two times a day (BID) | CUTANEOUS | 0 refills | Status: AC

## 2023-09-13 NOTE — Patient Instructions (Signed)
.  tfc  

## 2023-09-13 NOTE — Progress Notes (Signed)
 Healing well at nail surgery site without complaint.  Says the heel on the right is doing much better with much diminished pain after taking the prednisone .  Says she is probably about 90 to 95% better.  Still some soreness in the morning when she stands up on it.  Later in the day.   Physical exam:  General appearance: Pleasant, and in no acute distress. AOx3.  Vascular: Pedal pulses: DP 2/4 bilaterally, PT 2/4 bilaterally.  Mild edema lower legs bilaterally. Capillary fill time immediate.  Neurological: Negative Tinel's sign tarsal tunnel pedis right.  Dermatologic:   Skin normal temperature bilaterally.  Skin normal color, tone, and texture bilaterally.  Nail surgery site healing well with no signs of infection.  Musculoskeletal: Some tenderness plantar medial aspect of the heel at the medial calcaneal tubercle.  No tenderness with lateral compression of the calcaneus.  No posterior heel pain.  Radiographs: 3  views right foot: calcaneal spur at the plantar aspect calcaneus.  Notes any fractures or cystic changes or erosions.  Thickening of the plantar fascia.  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  Plantar fasciitis right. 2.  Calcaneal spur right.  Plan: -Established office visit for evaluation and management level 3. - Gave handout on exercises and at home therapy she can do for the plantar fasciitis. - Topical Voltaren gel as needed - Discussed proper shoes   Return as needed

## 2023-09-14 ENCOUNTER — Other Ambulatory Visit: Payer: Self-pay | Admitting: Nurse Practitioner

## 2023-09-14 DIAGNOSIS — C787 Secondary malignant neoplasm of liver and intrahepatic bile duct: Secondary | ICD-10-CM

## 2023-09-14 NOTE — Assessment & Plan Note (Addendum)
 [id number] stage IV with liver and nodal metastasis, MSS, KRAS G12S(+)  -Diagnosed in 01/2019 after emergent colectomy and liver biopsy. 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  -Started first line chemo on 03/12/2019, received 5FU/leuc with first 2 cycles for large open abdominal wound after surgery. She started full dose FOLFOX and avastin  with cycle 2. Due to neuropathy Oxaliplatin  was stopped after 09/24/19.  --For convenience she was switched to oral chemo Xeloda  2 weeks on/1 week off and Bevacizumab  every 3 weeks in 01/21/20. She is tolerating well -Her restaging CT scan from 03/11/2023 showed stable liver metastasis, no other new lesions. I personally reviewed with pt  -new CT CAP done 08/2023 showing single small, possibly enlarging lesion in central lobe of the liver. Due to concenr for metastatic disease, new CT CAP ordered to be done toward end of June 2025.  -Since she is tolerating treatment very well, I will see her every other treatment (6 week)

## 2023-09-14 NOTE — Progress Notes (Signed)
 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  -Started first line chemo on 03/12/2019, received 5FU/leuc with first 2 cycles for large open abdominal wound after surgery. She started full dose FOLFOX and avastin  with cycle 2. Due to neuropathy Oxaliplatin  was stopped after 09/24/19.  --For convenience she was switched to oral chemo Xeloda  2 weeks on/1 week off and Bevacizumab  every 3 weeks in 01/21/20. She is tolerating well -Her restaging CT scan from 03/11/2023 showed stable liver metastasis, no other new lesions. I personally reviewed with pt  -new CT CAP done 08/2023 showing single small, possibly enlarging lesion in central lobe of the liver. Due to concenr for metastatic disease, new CT CAP ordered to be done toward end of June 2025.  -Since she is tolerating treatment very well, I will see her every other treatment (6 week)   Plan: Patient seen in infusion today -CBC showing mild and stable anemia -CMP and CEA pending (most recent CEA levels have been trending up) -urine protein Is negative  Proceed with bevacizumab  today  Continue xeloda  2000 mg twice daily for 14 days, followed by 7 days off. New prescription sent to mail pharmacy today.  New CT CAP ordered today due to small, possibly enlarging lesion seen in central lobe of the liver For now,  continue with bevacizumab  every 3 weeks. Follow up in 6 weeks with labs/flush, follow up, and treatment.  The patient understands the plans discussed today and is in agreement with them.  She knows to contact our office if she develops concerns prior to her next appointment.  I provided 20 minutes of face-to-face time during this encounter and > 50% was spent counseling as documented under my assessment and plan.    Sharyon Deis, NP  [facility] - A DEPT OF [facility] [address] [place] [address] Dept: [phone] Dept Fax: [phone]   Orders Placed This Encounter  Procedures   CT CHEST ABDOMEN PELVIS W CONTRAST    Standing Status:   Future    Expected Date:   09/29/2023    Expiration Date:   09/14/2024    If indicated for the ordered procedure, I authorize the administration of contrast media per Radiology protocol:   Yes    Does the patient have a contrast media/X-ray dye allergy?:   No    Preferred imaging location?:   [facility]    If indicated for the ordered procedure, I authorize the administration of oral contrast media per Radiology protocol:   Yes      CHIEF COMPLAINT:  CC: Adenocarcinoma of the colon with metastases to the liver  Current Treatment: Bevacizumab  every 3 weeks and Xeloda   INTERVAL HISTORY:  Casey Black is here today for repeat clinical assessment.  She was last seen by Dr. Maryalice Smaller on 08/04/2023. Reports a 24 hour period of time, about 2 weeks ago.  Of mid epigastric, belly, and chest discomfort. She states that she did have some loose stools during that time. This went away after 24 hours and has not come back. She states that since then, she has been doing well. She denies chest pain, chest pressure, or shortness of breath. She denies headaches or visual disturbances. She denies abdominal pain, nausea, vomiting, or changes in bowel or bladder habits.   She denies fevers or  chills. She denies pain. Her appetite is good. Her weight has been stable.  I have reviewed the past medical history, past surgical history, social history and family history with the patient and they are unchanged from previous note.  ALLERGIES:  has no known allergies.  MEDICATIONS:  Current Outpatient Medications  Medication Sig Dispense Refill   acetaminophen  (TYLENOL ) 325 MG tablet Take 2 tablets (650 mg total) by mouth every 6 (six) hours as needed.     amLODipine  (NORVASC ) 10 MG tablet TAKE 1 TABLET BY MOUTH EVERY DAY 90 tablet 1   benzonatate  (TESSALON ) 100 MG capsule Take 1 capsule (100 mg total) by mouth 2 (two) times daily as needed for cough. 20 capsule 0   capecitabine  (XELODA ) 500 MG tablet Take 4 tablets (2,000 mg total) by mouth 2 (two) times daily after a meal. Take for 14 days, then off for 7 days. 112 tablet 2   ferrous sulfate  325 (65 FE) MG tablet TAKE 1 TABLET BY MOUTH TWICE A DAY WITH FOOD 180 tablet 1   gabapentin  (NEURONTIN ) 300 MG capsule Take 1 capsule (300 mg total) by mouth 2 (two) times daily. 60 capsule 3   hydrALAZINE  (APRESOLINE ) 25 MG tablet TAKE 1 TABLET BY MOUTH EVERY 8 HOURS 270 tablet 0   hydrochlorothiazide  (HYDRODIURIL ) 12.5 MG tablet TAKE 1 TABLET BY MOUTH EVERY DAY 90 tablet 1   hydrocortisone  (ANUSOL -HC) 2.5 % rectal cream Place 1 application rectally 2 (two) times daily. 30 g 0   Ibuprofen  200 MG CAPS Take 400 mg by mouth daily as needed (pain).     isosorbide  mononitrate (IMDUR ) 30 MG 24 hr tablet TAKE 1 TABLET BY MOUTH EVERY DAY 90 tablet 1   levothyroxine  (SYNTHROID ) 25 MCG tablet Take 1 tablet (25 mcg total) by mouth daily. 90 tablet 1   lidocaine  (LMX) 4 % cream Apply 1 application topically 3 (three) times daily as needed. 30 g 0   lidocaine -prilocaine  (EMLA ) cream APPLY 1 APPLICATION TOPICALLY AS NEEDED. 30 g 1   lisinopril  (ZESTRIL ) 40 MG tablet TAKE 1 TABLET BY MOUTH EVERY DAY 90 tablet 1   mupirocin ointment (BACTROBAN) 2 % Apply 1  Application topically 2 (two) times daily. 60 g 0   ondansetron  (ZOFRAN ) 8 MG tablet Take 1 tablet (8 mg total) by mouth every 8 (eight) hours as needed for nausea or vomiting. 20 tablet 2   polycarbophil (FIBERCON) 625 MG tablet Take 1 tablet (625 mg total) by mouth daily. 30 tablet 0   predniSONE  (DELTASONE ) 5 MG tablet 12 day taper: 6,6,5,5,4,4,3,3,2,2,1,1 42 tablet 0   prochlorperazine  (COMPAZINE ) 10 MG tablet Take 1 tablet (10 mg total) by mouth every 6 (six) hours as needed for nausea or vomiting. 30 tablet 2   No current facility-administered medications for this visit.   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 Bowel-gas pattern consistent with small bowel obstruction. No free air. 2. No acute chest findings.   01/23/2019 Imaging   CT AP IMPRESSION: Obstructing mid transverse colonic mass with mild regional adenopathy and hepatic metastatic disease. The mass likely extends through the serosa; no ascites or peritoneal nodularity.   01/24/2019 Surgery   Surgeon: Adalberto Acton MD Assistant: Fairy Homer PA-C Procedure performed: Transverse colectomy with end colostomy, liver biopsy Procedure classification: URGENT/EMERGENT Preop diagnosis: Obstructing, metastatic transverse colon mass Post-op diagnosis/intraop findings: Same   01/24/2019 Pathology Results   FINAL MICROSCOPIC DIAGNOSIS:    A. COLON, TRANSVERSE, RESECTION:  Colonic adenocarcinoma, 5 cm.  Carcinoma extends into pericolonic connective tissue and focally to  serosal surface.  Margins not involved.  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   B. LIVER NODULE, LEFT, BIOPSY:  Metastatic adenocarcinoma.    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 Hypermetabolic metastatic disease in the liver and mediastinal/hilar/axillary lymph nodes. 2. Focal hypermetabolism in the rectum. Continued attention on follow-up exams is warranted. 3. Focal hypermetabolism medial to the right adrenal gland may be within a metastatic lymph node, better visualized on 01/23/2019. 4. Aortic atherosclerosis (ICD10-170.0). Coronary artery calcification.   03/12/2019 -  Chemotherapy   She started 5FU q2weeks on 03/12/19 for 2 cycles. She started full dose FOLFOX with Avastin  on 04/09/19. Oxaliplatin  dose reduced repeatedly due to neuropathy C12 and held since C16 on 10/09/19. Now on maintenance Avastin  and 5FU q2weeks since 10/09/19       -Maintenance change to maintenance xeloda  2000 mg BID days 1-14 q21 days and q3 weeks Zirabev  (15 mg/kg) starting 01/21/20. First cycle was taken 1000mg  BID due to misunderstanding.    05/31/2019 Imaging   Restaging CT CAP IMPRESSION: 1. Similar to mild interval decrease in size of multiple hepatic lesions, partially calcified. 2. 2 mm right upper lobe pulmonary nodule. Recommend attention on follow-up. 3. Emphysema and aortic atherosclerosis.   08/23/2019 Imaging   CT CAP w contrast  IMPRESSION: 1. The dominant peripheral right liver metastasis has mildly increased. Other smaller liver metastases are stable. 2.  Otherwise no new or progressive metastatic disease in the chest, abdomen or pelvis. 3. Aortic Atherosclerosis (ICD10-I70.0) and Emphysema (ICD10-J43.9).   11/27/2019 Imaging   CT CAP w contrast  IMPRESSION: Status post transverse colectomy with right mid abdominal colostomy.   Mildly progressive hepatic metastases, as above.   No evidence of metastatic disease in the chest. Small mediastinal lymph nodes are within normal limits.   Additional stable ancillary findings as above.   01/21/2020 -  Chemotherapy   Patient is on Treatment Plan : COLORECTAL Bevacizumab  q21d     02/21/2020 Imaging   IMPRESSION: 1. Stable hepatic metastatic disease. 2. Aortic atherosclerosis (ICD10-I70.0). Coronary artery calcification. 3.  Emphysema (ICD10-J43.9).   05/19/2020 Imaging   CT  CAP  IMPRESSION: 1. Multiple partially calcified liver metastases are again noted. With the exception of a small lesion in segment 7/8 lesions are not significantly changed in the interval. No new liver lesions identified. 2. Coronary artery atherosclerotic calcifications. 3. Aortic atherosclerosis. 4. 2 mm right upper lobe lung nodule identified.  Unchanged.   Aortic Atherosclerosis (ICD10-I70.0).   11/17/2020 Imaging   CT CAP  IMPRESSION: 1. Partially calcified lesions throughout the liver are stable accounting for differences in technique, contrasted imaging on today's study is compared to noncontrast imaging on the prior. 2. No new hepatic lesions. 3. Tiny 3 mm pulmonary nodule in the RIGHT upper lobe unchanged since the prior study. Attention on follow-up. 4. Mild fullness of RIGHT paratracheal nodal tissue is minimally increased and borderline enlarged, attention on follow-up. 5. RIGHT lower quadrant colostomy. 6. 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 7. Aortic atherosclerosis.    06/09/2021 Imaging   EXAM: CT CHEST, ABDOMEN, AND PELVIS WITH CONTRAST  IMPRESSION: Stable calcified liver metastases.   Stable mildly enlarged right paratracheal lymph node.   No evidence of new or progressive metastatic disease.   Aortic Atherosclerosis (ICD10-I70.0).   09/14/2021 PET scan   IMPRESSION: Multiple hypermetabolic and partially calcified liver metastases, without significant change compared to most recent CT.   Mild hypermetabolic mediastinal and bilateral hilar lymphadenopathy, also without significant change.   No evidence of new or progressive metastatic disease.     Electronically Signed   By: Marlyce Sine M.D.   On: 09/15/2021 11:04   04/15/2022 Imaging    IMPRESSION: 1. Several previously noted partially calcified hepatic metastases appear grossly stable compared to the prior examination. No new hepatic lesions or other signs of metastatic disease noted elsewhere in the chest, abdomen or pelvis. 2. Aortic atherosclerosis, in addition to left main and three-vessel coronary artery disease. Please note that although the presence of coronary artery calcium  documents the presence of coronary artery disease, the severity of this disease and any potential stenosis cannot be assessed on this non-gated CT examination. Assessment for potential risk factor modification, dietary therapy or pharmacologic therapy may be warranted, if clinically indicated. 3. There are calcifications of the mitral annulus. Echocardiographic correlation for evaluation of potential valvular dysfunction may be warranted if clinically indicated. 4. Additional incidental findings, similar to prior studies, as above.   07/15/2022 Imaging    IMPRESSION: 1. Stable exam. No new or progressive findings in the abdomen or pelvis to suggest worsening disease. 2. No substantial change in calcified liver metastases. 3. Right abdominal transverse end colostomy with parastomal herniation of  fat and colon proximal to the stoma. 4. Probable left-sided uterine fibroid measuring on the order of 3.1 cm and displacing the endometrial stripe to the right. Pelvic ultrasound could be used to further evaluate as clinically warranted. 5.  Aortic Atherosclerosis (ICD10-I70.0).     07/07/2023 Imaging   CT chest, abdomen, and pelvis IMPRESSION: Partially calcified treated hepatic metastatic disease remains stable, except for mild increase in size of a single small low-attenuation lesion in central left hepatic lobe suspicious for enlarging metastasis. Recommend correlation with tumor markers, and consider short-term follow-up by CT in 3-6 months.   No other sites of metastatic disease identified.   Stable uterine fibroid.         REVIEW OF SYSTEMS:   Constitutional: Denies fevers, chills or abnormal weight loss Eyes: Denies blurriness  of vision Ears, nose, mouth, throat, and face: Denies mucositis or sore throat Respiratory: Denies cough, dyspnea or wheezes Cardiovascular: Denies palpitation, chest discomfort or lower extremity swelling Gastrointestinal:  Denies nausea, heartburn or change in bowel habits Skin: Denies abnormal skin rashes Lymphatics: Denies new lymphadenopathy or easy bruising Neurological:Denies numbness, tingling or new weaknesses Behavioral/Psych: Mood is stable, no new changes  All other systems were reviewed with the patient and are negative.   VITALS:   Today's Vitals   09/15/23 1448  BP: (!) 152/59  Pulse: (!) 54  Resp: 16  Temp: 97.7 F (36.5 C)  SpO2: 100%  Weight: 238 lb (108 kg)   Body mass index is 36.19 kg/m.   Wt Readings from Last 3 Encounters:  09/15/23 238 lb 8 oz (108.2 kg)  09/15/23 238 lb (108 kg)  08/25/23 234 lb 3.2 oz (106.2 kg)    Body mass index is 36.19 kg/m.  Performance status (ECOG): 1 - Symptomatic but completely ambulatory  PHYSICAL EXAM:   GENERAL:alert, no distress and comfortable SKIN: skin color, texture,  turgor are normal, no rashes or significant lesions EYES: normal, Conjunctiva are pink and non-injected, sclera clear OROPHARYNX:no exudate, no erythema and lips, buccal mucosa, and tongue normal  NECK: supple, thyroid  normal size, non-tender, without nodularity LYMPH:  no palpable lymphadenopathy in the cervical, axillary or inguinal LUNGS: clear to auscultation and percussion with normal breathing effort HEART: regular rate & rhythm and no murmurs and no lower extremity edema ABDOMEN:abdomen soft, non-tender and normal bowel sounds. Intact colostomy.  Musculoskeletal:no cyanosis of digits and no clubbing  NEURO: alert & oriented x 3 with fluent speech, no focal motor/sensory deficits  LABORATORY DATA:  I have reviewed the data as listed    Component Value Date/Time   NA 136 08/25/2023 1347   K 4.2 08/25/2023 1347   CL 107 08/25/2023 1347   CO2 23 08/25/2023 1347   GLUCOSE 99 08/25/2023 1347   BUN 28 (H) 08/25/2023 1347   CREATININE 1.16 (H) 08/25/2023 1347   CALCIUM  9.5 08/25/2023 1347   PROT 7.9 08/25/2023 1347   ALBUMIN 4.1 08/25/2023 1347   AST 22 08/25/2023 1347   ALT 13 08/25/2023 1347   ALKPHOS 93 08/25/2023 1347   BILITOT 0.6 08/25/2023 1347   GFRNONAA 53 (L) 08/25/2023 1347   GFRAA >60 01/07/2020 0957   Lab Results  Component Value Date   WBC 7.3 09/15/2023   NEUTROABS 4.4 09/15/2023   HGB 11.2 (L) 09/15/2023   HCT 33.8 (L) 09/15/2023   MCV 100.6 (H) 09/15/2023   PLT 177 09/15/2023     RADIOGRAPHIC STUDIES: DG Foot Complete Right Result Date: 09/13/2023 Please see detailed radiograph report in office note.

## 2023-09-15 ENCOUNTER — Other Ambulatory Visit

## 2023-09-15 ENCOUNTER — Inpatient Hospital Stay: Admitting: Nurse Practitioner

## 2023-09-15 ENCOUNTER — Encounter: Payer: Self-pay | Admitting: Hematology

## 2023-09-15 ENCOUNTER — Encounter: Payer: Self-pay | Admitting: Nurse Practitioner

## 2023-09-15 ENCOUNTER — Inpatient Hospital Stay: Attending: Hematology

## 2023-09-15 ENCOUNTER — Other Ambulatory Visit: Payer: Self-pay | Admitting: Nurse Practitioner

## 2023-09-15 ENCOUNTER — Inpatient Hospital Stay

## 2023-09-15 ENCOUNTER — Ambulatory Visit

## 2023-09-15 VITALS — BP 152/59 | HR 54 | Temp 97.7°F | Resp 16 | Wt 238.5 lb

## 2023-09-15 VITALS — BP 152/59 | HR 54 | Temp 97.7°F | Resp 16 | Wt 238.0 lb

## 2023-09-15 DIAGNOSIS — C779 Secondary and unspecified malignant neoplasm of lymph node, unspecified: Secondary | ICD-10-CM | POA: Diagnosis not present

## 2023-09-15 DIAGNOSIS — C189 Malignant neoplasm of colon, unspecified: Secondary | ICD-10-CM | POA: Diagnosis not present

## 2023-09-15 DIAGNOSIS — C787 Secondary malignant neoplasm of liver and intrahepatic bile duct: Secondary | ICD-10-CM | POA: Insufficient documentation

## 2023-09-15 DIAGNOSIS — Z5112 Encounter for antineoplastic immunotherapy: Secondary | ICD-10-CM | POA: Insufficient documentation

## 2023-09-15 DIAGNOSIS — C184 Malignant neoplasm of transverse colon: Secondary | ICD-10-CM | POA: Diagnosis not present

## 2023-09-15 DIAGNOSIS — Z95828 Presence of other vascular implants and grafts: Secondary | ICD-10-CM

## 2023-09-15 DIAGNOSIS — Z79620 Long term (current) use of immunosuppressive biologic: Secondary | ICD-10-CM | POA: Insufficient documentation

## 2023-09-15 DIAGNOSIS — Z79899 Other long term (current) drug therapy: Secondary | ICD-10-CM | POA: Insufficient documentation

## 2023-09-15 DIAGNOSIS — Z7189 Other specified counseling: Secondary | ICD-10-CM

## 2023-09-15 LAB — CMP (CANCER CENTER ONLY)
ALT: 16 U/L (ref 0–44)
AST: 22 U/L (ref 15–41)
Albumin: 3.5 g/dL (ref 3.5–5.0)
Alkaline Phosphatase: 78 U/L (ref 38–126)
Anion gap: 9 (ref 5–15)
BUN: 25 mg/dL — ABNORMAL HIGH (ref 8–23)
CO2: 23 mmol/L (ref 22–32)
Calcium: 9.4 mg/dL (ref 8.9–10.3)
Chloride: 106 mmol/L (ref 98–111)
Creatinine: 0.85 mg/dL (ref 0.44–1.00)
GFR, Estimated: >60 mL/min (ref >60)
Glucose, Bld: 86 mg/dL (ref 70–99)
Potassium: 4 mmol/L (ref 3.5–5.1)
Sodium: 138 mmol/L (ref 135–145)
Total Bilirubin: 0.7 mg/dL (ref 0.0–1.2)
Total Protein: 7.1 g/dL (ref 6.5–8.1)

## 2023-09-15 LAB — CBC WITH DIFFERENTIAL (CANCER CENTER ONLY)
Abs Immature Granulocytes: 0.02 10*3/uL (ref 0.00–0.07)
Basophils Absolute: 0 10*3/uL (ref 0.0–0.1)
Basophils Relative: 1 %
Eosinophils Absolute: 0.3 10*3/uL (ref 0.0–0.5)
Eosinophils Relative: 4 %
HCT: 33.8 % — ABNORMAL LOW (ref 36.0–46.0)
Hemoglobin: 11.2 g/dL — ABNORMAL LOW (ref 12.0–15.0)
Immature Granulocytes: 0 %
Lymphocytes Relative: 28 %
Lymphs Abs: 2.1 10*3/uL (ref 0.7–4.0)
MCH: 33.3 pg (ref 26.0–34.0)
MCHC: 33.1 g/dL (ref 30.0–36.0)
MCV: 100.6 fL — ABNORMAL HIGH (ref 80.0–100.0)
Monocytes Absolute: 0.5 10*3/uL (ref 0.1–1.0)
Monocytes Relative: 7 %
Neutro Abs: 4.4 10*3/uL (ref 1.7–7.7)
Neutrophils Relative %: 60 %
Platelet Count: 177 10*3/uL (ref 150–400)
RBC: 3.36 MIL/uL — ABNORMAL LOW (ref 3.87–5.11)
RDW: 16.4 % — ABNORMAL HIGH (ref 11.5–15.5)
WBC Count: 7.3 10*3/uL (ref 4.0–10.5)
nRBC: 0 % (ref 0.0–0.2)

## 2023-09-15 LAB — TOTAL PROTEIN, URINE DIPSTICK: Protein, ur: NEGATIVE mg/dL

## 2023-09-15 MED ORDER — SODIUM CHLORIDE 0.9 % IV SOLN
7.5000 mg/kg | Freq: Once | INTRAVENOUS | Status: AC
  Administered 2023-09-15: 800 mg via INTRAVENOUS
  Filled 2023-09-15: qty 32

## 2023-09-15 MED ORDER — SODIUM CHLORIDE 0.9% FLUSH
10.0000 mL | Freq: Once | INTRAVENOUS | Status: AC
  Administered 2023-09-15: 10 mL

## 2023-09-15 MED ORDER — CAPECITABINE 500 MG PO TABS: 2000.0000 mg | ORAL_TABLET | Freq: Two times a day (BID) | ORAL | 2 refills | Status: DC

## 2023-09-15 MED ORDER — SODIUM CHLORIDE 0.9 % IV SOLN: Freq: Once | INTRAVENOUS | Status: AC

## 2023-09-15 NOTE — Patient Instructions (Signed)
 [facility] - A DEPT OF [facility]  Discharge Instructions: Thank you for choosing [facility] to provide your oncology and hematology care.   If you have a lab appointment with the Cancer Center, please go directly to the Cancer Center and check in at the registration area.   Wear comfortable clothing and clothing appropriate for easy access to any Portacath or PICC line.   We strive to give you quality time with your provider. You may need to reschedule your appointment if you arrive late (15 or more minutes).  Arriving late affects you and other patients whose appointments are after yours.  Also, if you miss three or more appointments without notifying the office, you may be dismissed from the clinic at the provider's discretion.      For prescription refill requests, have your pharmacy contact our office and allow 72 hours for refills to be completed.    Today you received the following chemotherapy and/or immunotherapy agents: bevacizumab      To help prevent nausea and vomiting after your treatment, we encourage you to take your nausea medication as directed.  BELOW ARE SYMPTOMS THAT SHOULD BE REPORTED IMMEDIATELY: *FEVER GREATER THAN 100.4 F (38 C) OR HIGHER *CHILLS OR SWEATING *NAUSEA AND VOMITING THAT IS NOT CONTROLLED WITH YOUR NAUSEA MEDICATION *UNUSUAL SHORTNESS OF BREATH *UNUSUAL BRUISING OR BLEEDING *URINARY PROBLEMS (pain or burning when urinating, or frequent urination) *BOWEL PROBLEMS (unusual diarrhea, constipation, pain near the anus) TENDERNESS IN MOUTH AND THROAT WITH OR WITHOUT PRESENCE OF ULCERS (sore throat, sores in mouth, or a toothache) UNUSUAL RASH, SWELLING OR PAIN  UNUSUAL VAGINAL DISCHARGE OR ITCHING   Items with * indicate a potential emergency and should be followed up as soon as possible or go to the Emergency Department if any problems should occur.  Please show the CHEMOTHERAPY ALERT CARD or  IMMUNOTHERAPY ALERT CARD at check-in to the Emergency Department and triage nurse.  Should you have questions after your visit or need to cancel or reschedule your appointment, please contact [facility] - A DEPT OF [facility]  Dept: [phone]  and follow the prompts.  Office hours are 8:00 a.m. to 4:30 p.m. Monday - Friday. Please note that voicemails left after 4:00 p.m. may not be returned until the following business day.  We are closed weekends and major holidays. You have access to a nurse at all times for urgent questions. Please call the main number to the clinic Dept: [phone] and follow the prompts.   For any non-urgent questions, you may also contact your provider using MyChart. We now offer e-Visits for anyone 81 and older to request care online for non-urgent symptoms. For details visit [email or web address].   Also download the MyChart app! Go to the app store, search "MyChart", open the app, select [facility], and log in with your MyChart username and password.

## 2023-09-16 DIAGNOSIS — K56609 Unspecified intestinal obstruction, unspecified as to partial versus complete obstruction: Secondary | ICD-10-CM | POA: Diagnosis not present

## 2023-09-16 DIAGNOSIS — T8131XD Disruption of external operation (surgical) wound, not elsewhere classified, subsequent encounter: Secondary | ICD-10-CM | POA: Diagnosis not present

## 2023-09-16 DIAGNOSIS — S31109A Unspecified open wound of abdominal wall, unspecified quadrant without penetration into peritoneal cavity, initial encounter: Secondary | ICD-10-CM | POA: Diagnosis not present

## 2023-09-16 DIAGNOSIS — Z933 Colostomy status: Secondary | ICD-10-CM | POA: Diagnosis not present

## 2023-09-16 LAB — CEA (ACCESS): CEA (CHCC): 283.07 ng/mL — ABNORMAL HIGH (ref 0.00–5.00)

## 2023-09-17 ENCOUNTER — Other Ambulatory Visit: Payer: Self-pay

## 2023-09-24 ENCOUNTER — Other Ambulatory Visit: Payer: Self-pay | Admitting: Internal Medicine

## 2023-09-24 DIAGNOSIS — E039 Hypothyroidism, unspecified: Secondary | ICD-10-CM

## 2023-09-29 ENCOUNTER — Other Ambulatory Visit: Payer: Self-pay

## 2023-09-29 ENCOUNTER — Ambulatory Visit (HOSPITAL_COMMUNITY)
Admission: RE | Admit: 2023-09-29 | Discharge: 2023-09-29 | Disposition: A | Discharge status: Home / Self Care | Source: Ambulatory Visit | Attending: Nurse Practitioner | Admitting: Nurse Practitioner

## 2023-09-29 DIAGNOSIS — C787 Secondary malignant neoplasm of liver and intrahepatic bile duct: Secondary | ICD-10-CM | POA: Insufficient documentation

## 2023-09-29 DIAGNOSIS — C189 Malignant neoplasm of colon, unspecified: Secondary | ICD-10-CM | POA: Diagnosis not present

## 2023-09-29 DIAGNOSIS — I700 Atherosclerosis of aorta: Secondary | ICD-10-CM | POA: Diagnosis not present

## 2023-09-29 MED ORDER — IOHEXOL 9 MG/ML PO SOLN
ORAL | Status: AC
Start: 2023-09-29 — End: 2023-09-29
  Filled 2023-09-29: qty 1000

## 2023-09-29 MED ORDER — HEPARIN SOD (PORK) LOCK FLUSH 100 UNIT/ML IV SOLN
INTRAVENOUS | Status: AC
  Filled 2023-09-29: qty 5

## 2023-09-29 MED ORDER — IOHEXOL 300 MG/ML  SOLN
100.0000 mL | Freq: Once | INTRAMUSCULAR | Status: AC | PRN
  Administered 2023-09-29: 100 mL via INTRAVENOUS

## 2023-09-29 MED ORDER — SODIUM CHLORIDE (PF) 0.9 % IJ SOLN
INTRAMUSCULAR | Status: AC
  Filled 2023-09-29: qty 50

## 2023-10-06 ENCOUNTER — Inpatient Hospital Stay

## 2023-10-06 ENCOUNTER — Other Ambulatory Visit: Payer: Self-pay | Admitting: Hematology

## 2023-10-06 ENCOUNTER — Inpatient Hospital Stay: Attending: Hematology | Admitting: Hematology

## 2023-10-06 ENCOUNTER — Encounter: Payer: Self-pay | Admitting: Hematology

## 2023-10-06 VITALS — BP 142/70 | HR 63 | Temp 97.9°F | Resp 17 | Ht 68.0 in | Wt 239.8 lb

## 2023-10-06 DIAGNOSIS — C189 Malignant neoplasm of colon, unspecified: Secondary | ICD-10-CM

## 2023-10-06 DIAGNOSIS — Z95828 Presence of other vascular implants and grafts: Secondary | ICD-10-CM

## 2023-10-06 DIAGNOSIS — C184 Malignant neoplasm of transverse colon: Secondary | ICD-10-CM | POA: Diagnosis not present

## 2023-10-06 DIAGNOSIS — C779 Secondary and unspecified malignant neoplasm of lymph node, unspecified: Secondary | ICD-10-CM | POA: Diagnosis not present

## 2023-10-06 DIAGNOSIS — Z5111 Encounter for antineoplastic chemotherapy: Secondary | ICD-10-CM | POA: Diagnosis not present

## 2023-10-06 DIAGNOSIS — Z7189 Other specified counseling: Secondary | ICD-10-CM

## 2023-10-06 DIAGNOSIS — Z5112 Encounter for antineoplastic immunotherapy: Secondary | ICD-10-CM | POA: Insufficient documentation

## 2023-10-06 DIAGNOSIS — C787 Secondary malignant neoplasm of liver and intrahepatic bile duct: Secondary | ICD-10-CM | POA: Diagnosis not present

## 2023-10-06 DIAGNOSIS — Z79899 Other long term (current) drug therapy: Secondary | ICD-10-CM | POA: Diagnosis not present

## 2023-10-06 LAB — CBC WITH DIFFERENTIAL (CANCER CENTER ONLY)
Abs Immature Granulocytes: 0.03 10*3/uL (ref 0.00–0.07)
Basophils Absolute: 0 10*3/uL (ref 0.0–0.1)
Basophils Relative: 1 %
Eosinophils Absolute: 0.2 10*3/uL (ref 0.0–0.5)
Eosinophils Relative: 2 %
HCT: 35.5 % — ABNORMAL LOW (ref 36.0–46.0)
Hemoglobin: 11.9 g/dL — ABNORMAL LOW (ref 12.0–15.0)
Immature Granulocytes: 0 %
Lymphocytes Relative: 37 %
Lymphs Abs: 2.5 10*3/uL (ref 0.7–4.0)
MCH: 33.7 pg (ref 26.0–34.0)
MCHC: 33.5 g/dL (ref 30.0–36.0)
MCV: 100.6 fL — ABNORMAL HIGH (ref 80.0–100.0)
Monocytes Absolute: 0.7 10*3/uL (ref 0.1–1.0)
Monocytes Relative: 10 %
Neutro Abs: 3.5 10*3/uL (ref 1.7–7.7)
Neutrophils Relative %: 50 %
Platelet Count: 179 10*3/uL (ref 150–400)
RBC: 3.53 MIL/uL — ABNORMAL LOW (ref 3.87–5.11)
RDW: 16.3 % — ABNORMAL HIGH (ref 11.5–15.5)
WBC Count: 6.9 10*3/uL (ref 4.0–10.5)
nRBC: 0 % (ref 0.0–0.2)

## 2023-10-06 LAB — CMP (CANCER CENTER ONLY)
ALT: 13 U/L (ref 0–44)
AST: 22 U/L (ref 15–41)
Albumin: 4 g/dL (ref 3.5–5.0)
Alkaline Phosphatase: 98 U/L (ref 38–126)
Anion gap: 4 — ABNORMAL LOW (ref 5–15)
BUN: 25 mg/dL — ABNORMAL HIGH (ref 8–23)
CO2: 25 mmol/L (ref 22–32)
Calcium: 9.7 mg/dL (ref 8.9–10.3)
Chloride: 106 mmol/L (ref 98–111)
Creatinine: 0.92 mg/dL (ref 0.44–1.00)
GFR, Estimated: >60 mL/min (ref >60)
Glucose, Bld: 71 mg/dL (ref 70–99)
Potassium: 4.3 mmol/L (ref 3.5–5.1)
Sodium: 135 mmol/L (ref 135–145)
Total Bilirubin: 0.5 mg/dL (ref 0.0–1.2)
Total Protein: 7.6 g/dL (ref 6.5–8.1)

## 2023-10-06 LAB — TOTAL PROTEIN, URINE DIPSTICK: Protein, ur: NEGATIVE mg/dL

## 2023-10-06 LAB — CEA (ACCESS): CEA (CHCC): 371.71 ng/mL — ABNORMAL HIGH (ref 0.00–5.00)

## 2023-10-06 MED ORDER — SODIUM CHLORIDE 0.9 % IV SOLN
540.0000 mg | Freq: Once | INTRAVENOUS | Status: AC
  Administered 2023-10-06: 550 mg via INTRAVENOUS
  Filled 2023-10-06: qty 6

## 2023-10-06 MED ORDER — SODIUM CHLORIDE 0.9% FLUSH
10.0000 mL | Freq: Once | INTRAVENOUS | Status: AC
  Administered 2023-10-06: 10 mL

## 2023-10-06 MED ORDER — SODIUM CHLORIDE 0.9 % IV SOLN: Freq: Once | INTRAVENOUS | Status: AC

## 2023-10-06 NOTE — Progress Notes (Signed)
 [facility]   [phone] [phone]   Clinic Follow up Note   Patient Care Team: Casey Black, Casey GRADE, MD as PCP - General (Internal Medicine) Casey Agent, MD as PCP - Cardiology (Cardiology) Casey [name], MD as Consulting Physician (General Surgery) Casey Callander, MD as Consulting Physician (Hematology) Casey Black, Casey K, NP as Nurse Practitioner (Nurse Practitioner)  Date of Service:  10/06/2023  CHIEF COMPLAINT: f/u of metastatic colon cancer  CURRENT THERAPY:  Xeloda  and bevacizumab  maintenance therapy  Oncology History   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  -Started first line chemo on 03/12/2019, received 5FU/leuc with first 2 cycles for large open abdominal wound after surgery. She started full dose FOLFOX and avastin  with cycle 2. Due to neuropathy Oxaliplatin  was stopped after 09/24/19.  --For convenience she was switched to oral chemo Xeloda  2 weeks on/1 week off and Bev every 3 weeks in 01/21/20. She is tolerating well -Her restaging CT scan from 03/11/2023 showed stable liver metastasis, no other new lesions.  -CT 09/29/2023 showed mild disease progression in liver  -will change her treatment to FOLFIRI and beva   Assessment & Plan Metastatic colon cancer Metastatic colon cancer with liver lesions showing mild growth and stable lymph nodes with calcifications. Current treatment effective for three years, exceeding typical progression timelines. Recent scan indicates need for therapy change. Cancer is not curable, but treatment aims to control disease progression. - Continue current oral chemotherapy for this cycle, then discontinue. - Initiate FOLFIRI regimen with irinotecan and 5-FU pump infusion starting October 20, 2023. - Continue bevacizumab . - Schedule chemotherapy every two weeks. - Cancel previously scheduled appointments on July 24 and August 7, and reschedule to accommodate her trip. - Monitor disease progression with follow-up scans. - Discuss potential future treatment options if disease progresses.  Chemotherapy-induced peripheral neuropathy Peripheral neuropathy with numbness and tingling, exacerbated by previous oxaliplatin  treatment. Symptoms managed with medication, but persist. Decision to avoid oxaliplatin  in future treatments due to neuropathy risk. - Avoid oxaliplatin  in future treatment regimens. - Monitor neuropathy symptoms and adjust treatment as necessary.   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 Bowel-gas pattern consistent with small bowel obstruction. No free air. 2. No acute chest findings.   01/23/2019 Imaging   CT AP IMPRESSION: Obstructing mid transverse colonic mass with mild regional adenopathy and hepatic metastatic disease. The mass likely extends through the serosa; no ascites or peritoneal nodularity.   01/24/2019 Surgery   Surgeon: Mitzie Black Signe MD Assistant: Harlene Ferraris PA-C Procedure performed: Transverse colectomy with end colostomy, liver biopsy Procedure classification: URGENT/EMERGENT Preop diagnosis: Obstructing, metastatic transverse colon  mass Post-op diagnosis/intraop findings: Same   01/24/2019 Pathology Results   FINAL MICROSCOPIC DIAGNOSIS:  A. COLON, TRANSVERSE, RESECTION:  Colonic adenocarcinoma, 5 cm.  Carcinoma extends into pericolonic connective tissue and focally to  serosal surface.  Margins not involved.  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   B. LIVER NODULE, LEFT, BIOPSY:  Metastatic adenocarcinoma.    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 Hypermetabolic metastatic disease in the liver and mediastinal/hilar/axillary lymph nodes. 2. Focal hypermetabolism in the rectum. Continued attention on follow-up exams is warranted. 3. Focal hypermetabolism medial to the right adrenal gland may be within a metastatic lymph node, better visualized on 01/23/2019. 4. Aortic atherosclerosis (ICD10-170.0). Coronary artery calcification.   03/12/2019 -  Chemotherapy   She started 5FU q2weeks on 03/12/19 for 2 cycles. She started full dose FOLFOX with Avastin  on 04/09/19. Oxaliplatin  dose reduced repeatedly due to neuropathy C12 and held since C16 on 10/09/19. Now on maintenance Avastin  and 5FU q2weeks since 10/09/19       -Maintenance change to maintenance xeloda  2000 mg BID days 1-14 q21 days and q3 weeks Zirabev  (15 mg/kg) starting 01/21/20. First cycle was taken 1000mg  BID due to misunderstanding.    05/31/2019 Imaging   Restaging CT CAP IMPRESSION: 1. Similar to mild interval decrease in size of multiple hepatic lesions, partially calcified. 2. 2 mm right upper lobe pulmonary nodule. Recommend attention on follow-up. 3. Emphysema and aortic atherosclerosis.   08/23/2019 Imaging   CT CAP w contrast  IMPRESSION: 1. The  dominant peripheral right liver metastasis has mildly increased. Other smaller liver metastases are stable. 2. Otherwise no new or progressive metastatic disease in the chest, abdomen or pelvis. 3. Aortic Atherosclerosis (ICD10-I70.0) and Emphysema (ICD10-J43.9).   11/27/2019 Imaging   CT CAP w contrast  IMPRESSION: Status post transverse colectomy with right mid abdominal colostomy.   Mildly progressive hepatic metastases, as above.   No evidence of metastatic disease in the chest. Small mediastinal lymph nodes are within normal limits.   Additional stable ancillary findings as above.   01/21/2020 -  Chemotherapy   Patient is on Treatment Plan : COLORECTAL Bevacizumab  q21d     02/21/2020 Imaging   IMPRESSION: 1. Stable hepatic metastatic disease. 2. Aortic atherosclerosis (ICD10-I70.0). Coronary artery calcification. 3.  Emphysema (ICD10-J43.9).   05/19/2020 Imaging   CT CAP  IMPRESSION: 1. Multiple partially calcified liver metastases are again noted. With the exception of a small lesion in segment 7/8 lesions are not significantly changed in the interval. No new liver lesions identified. 2. Coronary artery atherosclerotic calcifications. 3. Aortic atherosclerosis. 4. 2 mm right upper lobe lung nodule identified.  Unchanged.   Aortic Atherosclerosis (ICD10-I70.0).   11/17/2020 Imaging   CT CAP  IMPRESSION: 1. Partially calcified lesions throughout the liver are stable accounting for differences in technique, contrasted imaging on today's study is compared to noncontrast imaging on the prior. 2. No new hepatic lesions. 3. Tiny 3 mm pulmonary nodule in the RIGHT upper lobe unchanged since the prior study. Attention on follow-up. 4. Mild fullness of RIGHT paratracheal nodal tissue is minimally increased and borderline enlarged, attention on follow-up. 5. RIGHT lower quadrant colostomy. 6. 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 7. Aortic atherosclerosis.   06/09/2021 Imaging   EXAM: CT CHEST, ABDOMEN, AND PELVIS WITH CONTRAST  IMPRESSION: Stable calcified liver metastases.   Stable mildly enlarged right paratracheal lymph node.   No evidence of new or progressive metastatic disease.   Aortic Atherosclerosis (ICD10-I70.0).   09/14/2021 PET scan   IMPRESSION: Multiple hypermetabolic and partially calcified liver metastases, without significant change compared to most recent CT.   Mild hypermetabolic mediastinal and bilateral hilar lymphadenopathy, also without significant change.   No evidence of new or progressive metastatic disease.     Electronically Signed   By: Norleen DELENA Kil M.D.   On: 09/15/2021 11:04   04/15/2022 Imaging    IMPRESSION: 1. Several previously noted partially calcified hepatic metastases appear grossly stable compared to the prior examination. No new hepatic lesions or other signs of metastatic disease noted elsewhere in the chest, abdomen or pelvis. 2. Aortic atherosclerosis, in addition to left main and three-vessel coronary artery disease. Please note that although the presence of coronary artery calcium  documents the presence of coronary artery disease, the severity of this disease and any potential stenosis cannot be assessed on this non-gated CT examination. Assessment for potential risk factor modification, dietary therapy or pharmacologic therapy may be warranted, if clinically indicated. 3. There are calcifications of the mitral annulus. Echocardiographic correlation for evaluation of potential valvular dysfunction may be warranted if clinically indicated. 4. Additional incidental findings, similar to prior studies, as above.   07/15/2022 Imaging    IMPRESSION: 1. Stable exam. No new or progressive findings in the abdomen or pelvis to suggest worsening disease. 2. No substantial  change in calcified liver metastases. 3. Right abdominal transverse end colostomy with parastomal herniation of fat and colon proximal to the stoma. 4. Probable left-sided uterine fibroid measuring on the order of 3.1 cm and displacing the endometrial stripe to the right. Pelvic ultrasound could be used to further evaluate as clinically warranted. 5.  Aortic Atherosclerosis (ICD10-I70.0).     07/07/2023 Imaging   CT chest, abdomen, and pelvis IMPRESSION: Partially calcified treated hepatic metastatic disease remains stable, except for mild increase in size of a single small low-attenuation lesion in central left hepatic lobe suspicious for enlarging metastasis. Recommend correlation with tumor markers, and consider short-term follow-up by CT in 3-6 months.   No other sites of metastatic disease identified.   Stable uterine fibroid.        Discussed the use of AI scribe software for clinical note transcription with the patient, who gave verbal consent to proceed.  History of Present Illness Casey Black is a 63 year old female with metastatic colon cancer who presents for follow-up.  She has been on her current maintenance therapy for three years. Recent imaging shows stable liver lesions with mild growth in a few. She experiences chemotherapy-induced neuropathy, with numbness and tingling, managed with medication and protective measures. The neuropathy is more pronounced and sometimes affects her legs post-chemotherapy.  She is currently on oral chemotherapy, in the second week of the cycle that started on June 23rd, and will finish by July 6th, followed by a ten-day break.     All other systems were reviewed with the patient and are negative.  MEDICAL HISTORY:  Past Medical History:  Diagnosis Date   Anemia    low iron   Colon cancer (HCC) 01/2019   Hypertension 01/23/2019   Personal history of chemotherapy 01/2019   colon CA   SBO (small bowel obstruction) (HCC)  01/23/2019    SURGICAL HISTORY: Past Surgical History:  Procedure Laterality Date   CESAREAN SECTION     x2   COLOSTOMY N/A 01/24/2019   Procedure: End Loop Colostomy;  Surgeon: Casey [name], MD;  Location: [facility] OR;  Service: General;  Laterality: N/A;   PARTIAL COLECTOMY N/A 01/24/2019   Procedure: PARTIAL COLECTOMY;  Surgeon: Casey [name], MD;  Location: [facility] OR;  Service: General;  Laterality: N/A;   PORTACATH PLACEMENT Right 02/28/2019   Procedure: INSERTION PORT-A-CATH WITH ULTRASOUND GUIDANCE;  Surgeon: Casey [name], MD;  Location: [facility] OR;  Service: General;  Laterality: Right;    I have reviewed the social history and family history with the patient and they are unchanged from previous note.  ALLERGIES:  has no known allergies.  MEDICATIONS:  Current Outpatient Medications  Medication Sig Dispense Refill   acetaminophen  (TYLENOL ) 325 MG tablet Take 2 tablets (650 mg total) by mouth every 6 (six) hours as needed.     amLODipine  (NORVASC ) 10 MG tablet TAKE 1 TABLET BY MOUTH EVERY DAY 90 tablet 1   benzonatate  (TESSALON ) 100 MG capsule Take 1 capsule (100 mg total) by mouth 2 (two) times daily as needed for cough. 20 capsule 0   capecitabine  (XELODA ) 500 MG tablet Take 4 tablets (2,000 mg total) by mouth 2 (two) times daily after a meal. Take for 14 days, then off for 7 days. 112 tablet 2   ferrous sulfate  325 (65 FE) MG tablet TAKE 1 TABLET BY MOUTH TWICE A DAY WITH FOOD 180 tablet 1   gabapentin  (NEURONTIN ) 300 MG capsule TAKE 1 CAPSULE BY MOUTH TWICE A DAY 60 capsule 3   hydrALAZINE  (APRESOLINE ) 25 MG tablet TAKE 1 TABLET BY MOUTH EVERY 8 HOURS 270 tablet 0   hydrochlorothiazide  (HYDRODIURIL ) 12.5 MG tablet TAKE 1 TABLET BY MOUTH EVERY DAY 90 tablet 1   hydrocortisone  (ANUSOL -HC) 2.5 % rectal cream Place 1 application rectally 2 (two) times daily. 30 g 0   Ibuprofen  200 MG CAPS Take 400 mg by mouth daily as needed (pain).     isosorbide  mononitrate (IMDUR ) 30 MG 24  hr tablet TAKE 1 TABLET BY MOUTH EVERY DAY 90 tablet 1   levothyroxine  (SYNTHROID ) 25 MCG tablet TAKE 1 TABLET BY MOUTH EVERY DAY 90 tablet 1   lidocaine  (LMX) 4 % cream Apply 1 application topically 3 (three) times daily as needed. 30 g 0   lidocaine -prilocaine  (EMLA ) cream APPLY 1 APPLICATION TOPICALLY AS NEEDED. 30 g 1   lisinopril  (ZESTRIL ) 40 MG tablet TAKE 1 TABLET BY MOUTH EVERY DAY 90 tablet 1   mupirocin  ointment (BACTROBAN ) 2 % Apply 1 Application topically 2 (two) times daily. 60 g 0   ondansetron  (ZOFRAN ) 8 MG tablet Take 1 tablet (8 mg total) by mouth every 8 (eight) hours as needed for nausea or vomiting. 20 tablet 2   polycarbophil (FIBERCON) 625 MG tablet Take 1 tablet (625 mg total) by mouth daily. 30 tablet 0   predniSONE  (DELTASONE ) 5 MG tablet 12 day taper: 6,6,5,5,4,4,3,3,2,2,1,1 42 tablet 0   prochlorperazine  (COMPAZINE ) 10 MG tablet Take 1 tablet (10 mg total) by mouth every 6 (six) hours as needed for nausea or vomiting. 30 tablet 2   No current facility-administered medications for this visit.   Facility-Administered Medications Ordered in Other Visits  Medication Dose Route Frequency Provider Last Rate Last Admin   sodium chloride  flush (NS) 0.9 % injection 10 mL  10 mL Intracatheter PRN Casey Callander, MD  10 mL at 06/06/20 1051    PHYSICAL EXAMINATION: ECOG PERFORMANCE STATUS: 1 - Symptomatic but completely ambulatory  Vitals:   10/06/23 1312  BP: (!) 142/70  Pulse: 63  Resp: 17  Temp: 97.9 F (36.6 C)  SpO2: 98%   Wt Readings from Last 3 Encounters:  10/06/23 239 lb 12.8 oz (108.8 kg)  09/15/23 238 lb 8 oz (108.2 kg)  09/15/23 238 lb (108 kg)     GENERAL:alert, no distress and comfortable SKIN: skin color, texture, turgor are normal, no rashes or significant lesions EYES: normal, Conjunctiva are pink and non-injected, sclera clear NECK: supple, thyroid  normal size, non-tender, without nodularity LYMPH:  no palpable lymphadenopathy in the cervical,  axillary  LUNGS: clear to auscultation and percussion with normal breathing effort HEART: regular rate & rhythm and no murmurs and no lower extremity edema ABDOMEN:abdomen soft, non-tender and normal bowel sounds Musculoskeletal:no cyanosis of digits and no clubbing  NEURO: alert & oriented x 3 with fluent speech, no focal motor/sensory deficits  Physical Exam    LABORATORY DATA:  I have reviewed the data as listed    Latest Ref Rng & Units 10/06/2023   12:48 PM 09/15/2023    2:03 PM 08/25/2023    1:47 PM  CBC  WBC 4.0 - 10.5 Black/uL 6.9  7.3  6.9   Hemoglobin 12.0 - 15.0 g/dL 88.0  88.7  88.8   Hematocrit 36.0 - 46.0 % 35.5  33.8  33.6   Platelets 150 - 400 Black/uL 179  177  180         Latest Ref Rng & Units 10/06/2023   12:49 PM 09/15/2023    2:35 PM 08/25/2023    1:47 PM  CMP  Glucose 70 - 99 mg/dL 71  86  99   BUN 8 - 23 mg/dL 25  25  28    Creatinine 0.44 - 1.00 mg/dL 9.07  9.14  8.83   Sodium 135 - 145 mmol/L 135  138  136   Potassium 3.5 - 5.1 mmol/L 4.3  4.0  4.2   Chloride 98 - 111 mmol/L 106  106  107   CO2 22 - 32 mmol/L 25  23  23    Calcium  8.9 - 10.3 mg/dL 9.7  9.4  9.5   Total Protein 6.5 - 8.1 g/dL 7.6  7.1  7.9   Total Bilirubin 0.0 - 1.2 mg/dL 0.5  0.7  0.6   Alkaline Phos 38 - 126 U/L 98  78  93   AST 15 - 41 U/L 22  22  22    ALT 0 - 44 U/L 13  16  13        RADIOGRAPHIC STUDIES: I have personally reviewed the radiological images as listed and agreed with the findings in the report. No results found.    No orders of the defined types were placed in this encounter.  All questions were answered. The patient knows to call the clinic with any problems, questions or concerns. No barriers to learning was detected. The total time spent in the appointment was 40 minutes, including review of chart and various tests results, discussions about plan of care and coordination of care plan     Onita Mattock, MD 10/06/2023

## 2023-10-06 NOTE — Assessment & Plan Note (Signed)
 [id number] stage IV with liver and nodal metastasis, MSS, KRAS G12S(+)  -Diagnosed in 01/2019 after emergent colectomy and liver biopsy. 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  -Started first line chemo on 03/12/2019, received 5FU/leuc with first 2 cycles for large open abdominal wound after surgery. She started full dose FOLFOX and avastin  with cycle 2. Due to neuropathy Oxaliplatin  was stopped after 09/24/19.  --For convenience she was switched to oral chemo Xeloda  2 weeks on/1 week off and Bev every 3 weeks in 01/21/20. She is tolerating well -Her restaging CT scan from 03/11/2023 showed stable liver metastasis, no other new lesions.  -CT 09/29/2023 showed mild disease progression in liver  -will change her treatment to FOLFIRI and beva

## 2023-10-06 NOTE — Progress Notes (Signed)
 START ON PATHWAY REGIMEN - Colorectal     A cycle is every 14 days:     Bevacizumab -xxxx      Irinotecan      Leucovorin       Fluorouracil       Fluorouracil    **Always confirm dose/schedule in your pharmacy ordering system**  Patient Characteristics: Distant Metastases, Nonsurgical Candidate, Non-KRAS G12C, RAS Mutation Positive/Unknown (BRAF V600 Wild-Type/Unknown), Standard Cytotoxic Therapy, Second Line Standard Cytotoxic Therapy, Bevacizumab  Eligible Tumor Location: Colon Therapeutic Status: Distant Metastases Microsatellite/Mismatch Repair Status: MSS/pMMR BRAF Mutation Status: Wild-Type (no mutation) KRAS/NRAS Mutation Status: Non-KRAS G12C, RAS Mutation Positive Preferred Therapy Approach: Standard Cytotoxic Therapy Standard Cytotoxic Line of Therapy: Second Line Standard Cytotoxic Therapy Bevacizumab  Eligibility: Eligible Intent of Therapy: Non-Curative / Palliative Intent, Discussed with Patient

## 2023-10-06 NOTE — Progress Notes (Signed)
 [name]  dose to 540mg  based on wt=108.8kg per Dr. Lanny.  Casey Black, PharmD, MBA

## 2023-10-12 NOTE — Progress Notes (Signed)
 Per staff message from Dr. Lanny ordered the following [facility] HER2 and [id number].  Was unable to order the UGT1A1 genetic testing in [facility] portal; therefore, sent email to [facility] Acct Representative. [id number]  Message Received: 6 days ago [name], MD  Seena Anders SQUIBB, RN; Daphane Norleen MATSU, CMA She had [facility] before, please add HER2 and CLDN18, and UGT1A1 genetic testing if they can, thx

## 2023-10-13 ENCOUNTER — Other Ambulatory Visit: Payer: Self-pay

## 2023-10-18 ENCOUNTER — Other Ambulatory Visit: Payer: Self-pay | Admitting: Hematology

## 2023-10-18 ENCOUNTER — Telehealth: Payer: Self-pay | Admitting: Hematology

## 2023-10-18 NOTE — Telephone Encounter (Signed)
 Scheduled appointments per WQ. Talked with the patient and she is aware of all made appointments.

## 2023-10-19 ENCOUNTER — Other Ambulatory Visit: Payer: Self-pay

## 2023-10-19 DIAGNOSIS — S31109A Unspecified open wound of abdominal wall, unspecified quadrant without penetration into peritoneal cavity, initial encounter: Secondary | ICD-10-CM | POA: Diagnosis not present

## 2023-10-19 DIAGNOSIS — Z933 Colostomy status: Secondary | ICD-10-CM | POA: Diagnosis not present

## 2023-10-19 DIAGNOSIS — T8131XD Disruption of external operation (surgical) wound, not elsewhere classified, subsequent encounter: Secondary | ICD-10-CM | POA: Diagnosis not present

## 2023-10-19 DIAGNOSIS — K56609 Unspecified intestinal obstruction, unspecified as to partial versus complete obstruction: Secondary | ICD-10-CM | POA: Diagnosis not present

## 2023-10-20 ENCOUNTER — Encounter (HOSPITAL_COMMUNITY): Payer: Self-pay | Admitting: Hematology

## 2023-10-21 ENCOUNTER — Other Ambulatory Visit

## 2023-10-21 ENCOUNTER — Encounter (HOSPITAL_COMMUNITY): Payer: Self-pay | Admitting: Hematology

## 2023-10-21 ENCOUNTER — Ambulatory Visit: Admitting: Nurse Practitioner

## 2023-10-27 ENCOUNTER — Inpatient Hospital Stay

## 2023-10-27 ENCOUNTER — Inpatient Hospital Stay (HOSPITAL_BASED_OUTPATIENT_CLINIC_OR_DEPARTMENT_OTHER): Admitting: Hematology

## 2023-10-27 ENCOUNTER — Inpatient Hospital Stay: Admitting: Hematology

## 2023-10-27 VITALS — BP 148/70 | HR 75 | Temp 98.0°F | Resp 18 | Ht 68.0 in | Wt 236.9 lb

## 2023-10-27 VITALS — BP 159/70 | HR 62 | Temp 97.7°F | Resp 16

## 2023-10-27 DIAGNOSIS — C787 Secondary malignant neoplasm of liver and intrahepatic bile duct: Secondary | ICD-10-CM

## 2023-10-27 DIAGNOSIS — Z95828 Presence of other vascular implants and grafts: Secondary | ICD-10-CM

## 2023-10-27 DIAGNOSIS — Z5111 Encounter for antineoplastic chemotherapy: Secondary | ICD-10-CM | POA: Diagnosis not present

## 2023-10-27 DIAGNOSIS — Z79899 Other long term (current) drug therapy: Secondary | ICD-10-CM | POA: Diagnosis not present

## 2023-10-27 DIAGNOSIS — C189 Malignant neoplasm of colon, unspecified: Secondary | ICD-10-CM | POA: Diagnosis not present

## 2023-10-27 DIAGNOSIS — C184 Malignant neoplasm of transverse colon: Secondary | ICD-10-CM | POA: Diagnosis not present

## 2023-10-27 DIAGNOSIS — C779 Secondary and unspecified malignant neoplasm of lymph node, unspecified: Secondary | ICD-10-CM | POA: Diagnosis not present

## 2023-10-27 DIAGNOSIS — Z5112 Encounter for antineoplastic immunotherapy: Secondary | ICD-10-CM | POA: Diagnosis not present

## 2023-10-27 LAB — CMP (CANCER CENTER ONLY)
ALT: 18 U/L (ref 0–44)
AST: 28 U/L (ref 15–41)
Albumin: 4.1 g/dL (ref 3.5–5.0)
Alkaline Phosphatase: 94 U/L (ref 38–126)
Anion gap: 6 (ref 5–15)
BUN: 23 mg/dL (ref 8–23)
CO2: 24 mmol/L (ref 22–32)
Calcium: 10 mg/dL (ref 8.9–10.3)
Chloride: 106 mmol/L (ref 98–111)
Creatinine: 0.95 mg/dL (ref 0.44–1.00)
GFR, Estimated: >60 mL/min (ref >60)
Glucose, Bld: 75 mg/dL (ref 70–99)
Potassium: 4.5 mmol/L (ref 3.5–5.1)
Sodium: 136 mmol/L (ref 135–145)
Total Bilirubin: 0.9 mg/dL (ref 0.0–1.2)
Total Protein: 8 g/dL (ref 6.5–8.1)

## 2023-10-27 LAB — CBC WITH DIFFERENTIAL (CANCER CENTER ONLY)
Abs Immature Granulocytes: 0.01 K/uL (ref 0.00–0.07)
Basophils Absolute: 0.1 K/uL (ref 0.0–0.1)
Basophils Relative: 1 %
Eosinophils Absolute: 0.2 K/uL (ref 0.0–0.5)
Eosinophils Relative: 4 %
HCT: 35.9 % — ABNORMAL LOW (ref 36.0–46.0)
Hemoglobin: 11.9 g/dL — ABNORMAL LOW (ref 12.0–15.0)
Immature Granulocytes: 0 %
Lymphocytes Relative: 31 %
Lymphs Abs: 1.9 K/uL (ref 0.7–4.0)
MCH: 33.4 pg (ref 26.0–34.0)
MCHC: 33.1 g/dL (ref 30.0–36.0)
MCV: 100.8 fL — ABNORMAL HIGH (ref 80.0–100.0)
Monocytes Absolute: 0.6 K/uL (ref 0.1–1.0)
Monocytes Relative: 10 %
Neutro Abs: 3.2 K/uL (ref 1.7–7.7)
Neutrophils Relative %: 54 %
Platelet Count: 150 K/uL (ref 150–400)
RBC: 3.56 MIL/uL — ABNORMAL LOW (ref 3.87–5.11)
RDW: 16 % — ABNORMAL HIGH (ref 11.5–15.5)
WBC Count: 6 K/uL (ref 4.0–10.5)
nRBC: 0 % (ref 0.0–0.2)

## 2023-10-27 LAB — TOTAL PROTEIN, URINE DIPSTICK: Protein, ur: 30 mg/dL — AB

## 2023-10-27 MED ORDER — DEXAMETHASONE SODIUM PHOSPHATE 10 MG/ML IJ SOLN
10.0000 mg | Freq: Once | INTRAMUSCULAR | Status: AC
  Administered 2023-10-27: 10 mg via INTRAVENOUS
  Filled 2023-10-27: qty 1

## 2023-10-27 MED ORDER — SODIUM CHLORIDE 0.9% FLUSH: 10.0000 mL | INTRAVENOUS | Status: DC | PRN

## 2023-10-27 MED ORDER — SODIUM CHLORIDE 0.9% FLUSH
10.0000 mL | Freq: Once | INTRAVENOUS | Status: AC
  Administered 2023-10-27: 10 mL

## 2023-10-27 MED ORDER — SODIUM CHLORIDE 0.9 % IV SOLN
180.0000 mg/m2 | Freq: Once | INTRAVENOUS | Status: AC
  Administered 2023-10-27: 400 mg via INTRAVENOUS
  Filled 2023-10-27: qty 15

## 2023-10-27 MED ORDER — ATROPINE SULFATE 1 MG/ML IV SOLN
0.5000 mg | Freq: Once | INTRAVENOUS | Status: AC | PRN
  Administered 2023-10-27: 0.5 mg via INTRAVENOUS
  Filled 2023-10-27: qty 1

## 2023-10-27 MED ORDER — LIDOCAINE-PRILOCAINE 2.5-2.5 % EX CREA
TOPICAL_CREAM | CUTANEOUS | 3 refills | Status: DC
Start: 2023-10-27 — End: 2024-02-12

## 2023-10-27 MED ORDER — PROCHLORPERAZINE MALEATE 10 MG PO TABS: 10.0000 mg | ORAL_TABLET | Freq: Four times a day (QID) | ORAL | 1 refills | Status: DC | PRN

## 2023-10-27 MED ORDER — HEPARIN SOD (PORK) LOCK FLUSH 100 UNIT/ML IV SOLN: 500.0000 [IU] | Freq: Once | INTRAVENOUS | Status: DC | PRN

## 2023-10-27 MED ORDER — SODIUM CHLORIDE 0.9 % IV SOLN
400.0000 mg/m2 | Freq: Once | INTRAVENOUS | Status: AC
  Administered 2023-10-27: 912 mg via INTRAVENOUS
  Filled 2023-10-27: qty 45.6

## 2023-10-27 MED ORDER — ONDANSETRON HCL 8 MG PO TABS: 8.0000 mg | ORAL_TABLET | Freq: Three times a day (TID) | ORAL | 1 refills | Status: DC | PRN

## 2023-10-27 MED ORDER — DEXAMETHASONE 4 MG PO TABS: 8.0000 mg | ORAL_TABLET | Freq: Every day | ORAL | 5 refills | Status: DC

## 2023-10-27 MED ORDER — PALONOSETRON HCL INJECTION 0.25 MG/5ML
0.2500 mg | Freq: Once | INTRAVENOUS | Status: AC
  Administered 2023-10-27: 0.25 mg via INTRAVENOUS
  Filled 2023-10-27: qty 5

## 2023-10-27 MED ORDER — LOPERAMIDE HCL 2 MG PO CAPS: ORAL_CAPSULE | ORAL | 3 refills | Status: DC

## 2023-10-27 MED ORDER — SODIUM CHLORIDE 0.9 % IV SOLN
INTRAVENOUS | Status: DC
Start: 2023-10-27 — End: 2023-10-27

## 2023-10-27 MED ORDER — SODIUM CHLORIDE 0.9 % IV SOLN
2400.0000 mg/m2 | INTRAVENOUS | Status: DC
  Administered 2023-10-27: 5000 mg via INTRAVENOUS
  Filled 2023-10-27: qty 100

## 2023-10-27 MED ORDER — SODIUM CHLORIDE 0.9 % IV SOLN
5.0000 mg/kg | Freq: Once | INTRAVENOUS | Status: AC
  Administered 2023-10-27: 500 mg via INTRAVENOUS
  Filled 2023-10-27: qty 4

## 2023-10-27 NOTE — Progress Notes (Signed)
 [facility]   [phone] [phone]   Clinic Follow up Note   Patient Care Team: [name], [name], MD as PCP - General (Internal Medicine) [name], MD as PCP - Cardiology (Cardiology) [name], MD as Consulting Physician (General Surgery) [name], MD as Consulting Physician (Hematology) [name], [name], NP as Nurse Practitioner (Nurse Practitioner)  Date of Service:  10/27/2023  CHIEF COMPLAINT: f/u of colon cancer  CURRENT THERAPY:  Second line FOLFIRI and bevacizumab   Oncology History   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  -Started first line chemo on 03/12/2019, received 5FU/leuc with first 2 cycles for large open abdominal wound after surgery. She started full dose FOLFOX and avastin  with cycle 2. Due to neuropathy Oxaliplatin  was stopped after 09/24/19.  --For convenience she was switched to oral chemo Xeloda  2 weeks on/1 week off and Bev every 3 weeks in 01/21/20. She is tolerating well -Her restaging CT scan from 03/11/2023 showed stable liver metastasis, no other new lesions.  -CT 09/29/2023 showed mild disease progression in liver  -will change her treatment to FOLFIRI and beva    Assessment & Plan Metastatic colon cancer Undergoing treatment with a new chemotherapy regimen consisting of erythritol, 5FU, leucovorin , and bevacizumab . This regimen is similar to initial treatment but stronger than recent ones. It does not cause cold sensitivity but can cause diarrhea. Current blood counts are adequate for treatment. Kidney and liver functions are being monitored. Bevacizumab  may cause hypertension. - Administer chemotherapy regimen with erythritol, 5FU, leucovorin , and bevacizumab . - Monitor for  diarrhea and advise taking loperamide  if loose stools occur more than two to three times. - Instruct to call if diarrhea is severe or if fever and chills occur. - Monitor blood counts and consider growth factor shot if counts are too low. - Monitor blood pressure due to potential for hypertension from bevacizumab .  Risk of infection due to chemotherapy Chemotherapy regimen causes low blood counts and immune suppression, increasing infection risk. Aware of signs of infection and need to contact healthcare provider if symptoms occur. - Educate on signs of infection and instruct to call if fever or chills occur. - Monitor blood counts and consider growth factor shot if counts are too low.  Pain in abdomen and shoulder Experiencing pain in the abdomen and shoulder, managed with acetaminophen . Abdominal pain resolved, possibly related to colostomy site or post-surgical changes. Shoulder pain could be related to port. - Continue acetaminophen  for pain management as needed. - Monitor for recurrence of pain and assess if related to colostomy or port.   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 Bowel-gas pattern consistent with small bowel obstruction. No free air. 2. No acute  chest findings.   01/23/2019 Imaging   CT AP IMPRESSION: Obstructing mid transverse colonic mass with mild  regional adenopathy and hepatic metastatic disease. The mass likely extends through the serosa; no ascites or peritoneal nodularity.   01/24/2019 Surgery   Surgeon: Mitzie DELENA Freund MD Assistant: Harlene Ferraris PA-C Procedure performed: Transverse colectomy with end colostomy, liver biopsy Procedure classification: URGENT/EMERGENT Preop diagnosis: Obstructing, metastatic transverse colon mass Post-op diagnosis/intraop findings: Same   01/24/2019 Pathology Results   FINAL MICROSCOPIC DIAGNOSIS:   A. COLON, TRANSVERSE, RESECTION:  Colonic adenocarcinoma, 5 cm.  Carcinoma extends into pericolonic connective tissue and focally to  serosal surface.  Margins not involved.  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   B. LIVER NODULE, LEFT, BIOPSY:  Metastatic adenocarcinoma.    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 Hypermetabolic metastatic disease in the liver and mediastinal/hilar/axillary lymph nodes. 2. Focal hypermetabolism in the rectum. Continued attention on follow-up exams is warranted. 3. Focal hypermetabolism medial to the right adrenal gland may be within a metastatic lymph node, better visualized on 01/23/2019. 4. Aortic atherosclerosis (ICD10-170.0). Coronary artery calcification.   03/12/2019 -  Chemotherapy   She started 5FU q2weeks on 03/12/19 for 2 cycles. She started full dose FOLFOX with Avastin  on 04/09/19. Oxaliplatin  dose reduced repeatedly due to neuropathy C12 and held since C16 on 10/09/19. Now on maintenance Avastin  and 5FU q2weeks since 10/09/19       -Maintenance change to maintenance xeloda  2000 mg BID days 1-14 q21 days and q3 weeks Zirabev  (15 mg/kg) starting 01/21/20.  First cycle was taken 1000mg  BID due to misunderstanding.    05/31/2019 Imaging   Restaging CT CAP IMPRESSION: 1. Similar to mild interval decrease in size of multiple hepatic lesions, partially calcified. 2. 2 mm right upper lobe pulmonary nodule. Recommend attention on follow-up. 3. Emphysema and aortic atherosclerosis.   08/23/2019 Imaging   CT CAP w contrast  IMPRESSION: 1. The dominant peripheral right liver metastasis has mildly increased. Other smaller liver metastases are stable. 2. Otherwise no new or progressive metastatic disease in the chest, abdomen or pelvis. 3. Aortic Atherosclerosis (ICD10-I70.0) and Emphysema (ICD10-J43.9).   11/27/2019 Imaging   CT CAP w contrast  IMPRESSION: Status post transverse colectomy with right mid abdominal colostomy.   Mildly progressive hepatic metastases, as above.   No evidence of metastatic disease in the chest. Small mediastinal lymph nodes are within normal limits.   Additional stable ancillary findings as above.   01/21/2020 - 10/06/2023 Chemotherapy   Patient is on Treatment Plan : COLORECTAL Bevacizumab  q21d     02/21/2020 Imaging   IMPRESSION: 1. Stable hepatic metastatic disease. 2. Aortic atherosclerosis (ICD10-I70.0). Coronary artery calcification. 3.  Emphysema (ICD10-J43.9).   05/19/2020 Imaging   CT CAP  IMPRESSION: 1. Multiple partially calcified liver metastases are again noted. With the exception of a small lesion in segment 7/8 lesions are not significantly changed in the interval. No new liver lesions identified. 2. Coronary artery atherosclerotic calcifications. 3. Aortic atherosclerosis. 4. 2 mm right upper lobe lung nodule identified.  Unchanged.   Aortic Atherosclerosis (ICD10-I70.0).   11/17/2020 Imaging   CT CAP  IMPRESSION: 1. Partially calcified lesions throughout the liver are stable accounting  for differences in technique, contrasted imaging on today's study is compared to noncontrast  imaging on the prior. 2. No new hepatic lesions. 3. Tiny 3 mm pulmonary nodule in the RIGHT upper lobe unchanged since the prior study. Attention on follow-up. 4. Mild fullness of RIGHT paratracheal nodal tissue is minimally increased and borderline enlarged, attention on follow-up. 5. RIGHT lower quadrant colostomy. 6. 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 7. Aortic atherosclerosis.   06/09/2021 Imaging   EXAM: CT CHEST, ABDOMEN, AND PELVIS WITH CONTRAST  IMPRESSION: Stable calcified liver metastases.   Stable mildly enlarged right paratracheal lymph node.   No evidence of new or progressive metastatic disease.   Aortic Atherosclerosis (ICD10-I70.0).   09/14/2021 PET scan   IMPRESSION: Multiple hypermetabolic and partially calcified liver metastases, without significant change compared to most recent CT.   Mild hypermetabolic mediastinal and bilateral hilar lymphadenopathy, also without significant change.   No evidence of new or progressive metastatic disease.     Electronically Signed   By: Norleen DELENA Kil M.D.   On: 09/15/2021 11:04   04/15/2022 Imaging    IMPRESSION: 1. Several previously noted partially calcified hepatic metastases appear grossly stable compared to the prior examination. No new hepatic lesions or other signs of metastatic disease noted elsewhere in the chest, abdomen or pelvis. 2. Aortic atherosclerosis, in addition to left main and three-vessel coronary artery disease. Please note that although the presence of coronary artery calcium  documents the presence of coronary artery disease, the severity of this disease and any potential stenosis cannot be assessed on this non-gated CT examination. Assessment for potential risk factor modification, dietary therapy or pharmacologic therapy may be warranted, if clinically indicated. 3.  There are calcifications of the mitral annulus. Echocardiographic correlation for evaluation of potential valvular dysfunction may be warranted if clinically indicated. 4. Additional incidental findings, similar to prior studies, as above.   07/15/2022 Imaging    IMPRESSION: 1. Stable exam. No new or progressive findings in the abdomen or pelvis to suggest worsening disease. 2. No substantial change in calcified liver metastases. 3. Right abdominal transverse end colostomy with parastomal herniation of fat and colon proximal to the stoma. 4. Probable left-sided uterine fibroid measuring on the order of 3.1 cm and displacing the endometrial stripe to the right. Pelvic ultrasound could be used to further evaluate as clinically warranted. 5.  Aortic Atherosclerosis (ICD10-I70.0).     07/07/2023 Imaging   CT chest, abdomen, and pelvis IMPRESSION: Partially calcified treated hepatic metastatic disease remains stable, except for mild increase in size of a single small low-attenuation lesion in central left hepatic lobe suspicious for enlarging metastasis. Recommend correlation with tumor markers, and consider short-term follow-up by CT in 3-6 months.   No other sites of metastatic disease identified.   Stable uterine fibroid.     10/27/2023 -  Chemotherapy   Patient is on Treatment Plan : COLORECTAL FOLFIRI + Bevacizumab  q14d        Discussed the use of AI scribe software for clinical note transcription with the patient, who gave verbal consent to proceed.  History of Present Illness Casey Black is a 63 year old female with metastatic colon cancer who presents for follow-up. She is accompanied by her daughter's father.  She is starting a new chemotherapy regimen with erythritol, 5FU, leucovorin , and bevacizumab , using a home pump similar to her initial treatment.  She experienced  abdominal and pelvic pain for about a week, which has resolved. The pain was located near her  ostomy and was managed with Tylenol . She also has shoulder pain, possibly related to her port. There is no constipation or recent nausea, and she has sufficient nausea medication at home.     All other systems were reviewed with the patient and are negative.  MEDICAL HISTORY:  Past Medical History:  Diagnosis Date   Anemia    low iron   Colon cancer (HCC) 01/2019   Hypertension 01/23/2019   Personal history of chemotherapy 01/2019   colon CA   SBO (small bowel obstruction) (HCC) 01/23/2019    SURGICAL HISTORY: Past Surgical History:  Procedure Laterality Date   CESAREAN SECTION     x2   COLOSTOMY N/A 01/24/2019   Procedure: End Loop Colostomy;  Surgeon: [name], MD;  Location: [facility] OR;  Service: General;  Laterality: N/A;   PARTIAL COLECTOMY N/A 01/24/2019   Procedure: PARTIAL COLECTOMY;  Surgeon: [name], MD;  Location: [facility] OR;  Service: General;  Laterality: N/A;   PORTACATH PLACEMENT Right 02/28/2019   Procedure: INSERTION PORT-A-CATH WITH ULTRASOUND GUIDANCE;  Surgeon: [name], MD;  Location: [facility] OR;  Service: General;  Laterality: Right;    I have reviewed the social history and family history with the patient and they are unchanged from previous note.  ALLERGIES:  has no known allergies.  MEDICATIONS:  Current Outpatient Medications  Medication Sig Dispense Refill   acetaminophen  (TYLENOL ) 325 MG tablet Take 2 tablets (650 mg total) by mouth every 6 (six) hours as needed.     amLODipine  (NORVASC ) 10 MG tablet TAKE 1 TABLET BY MOUTH EVERY DAY 90 tablet 1   benzonatate  (TESSALON ) 100 MG capsule Take 1 capsule (100 mg total) by mouth 2 (two) times daily as needed for cough. 20 capsule 0   capecitabine  (XELODA ) 500 MG tablet Take 4 tablets (2,000 mg total) by mouth 2 (two) times daily after a meal. Take for 14 days, then off for 7 days. 112 tablet 2   dexamethasone  (DECADRON ) 4 MG tablet Take 2 tablets (8 mg total) by mouth daily. Start the day  after chemotherapy for 2 days. Take with food. 8 tablet 5   ferrous sulfate  325 (65 FE) MG tablet TAKE 1 TABLET BY MOUTH TWICE A DAY WITH FOOD 180 tablet 1   gabapentin  (NEURONTIN ) 300 MG capsule TAKE 1 CAPSULE BY MOUTH TWICE A DAY 60 capsule 3   hydrALAZINE  (APRESOLINE ) 25 MG tablet TAKE 1 TABLET BY MOUTH EVERY 8 HOURS 270 tablet 0   hydrochlorothiazide  (HYDRODIURIL ) 12.5 MG tablet TAKE 1 TABLET BY MOUTH EVERY DAY 90 tablet 1   hydrocortisone  (ANUSOL -HC) 2.5 % rectal cream Place 1 application rectally 2 (two) times daily. 30 g 0   Ibuprofen  200 MG CAPS Take 400 mg by mouth daily as needed (pain).     isosorbide  mononitrate (IMDUR ) 30 MG 24 hr tablet TAKE 1 TABLET BY MOUTH EVERY DAY 90 tablet 1   levothyroxine  (SYNTHROID ) 25 MCG tablet TAKE 1 TABLET BY MOUTH EVERY DAY 90 tablet 1   lidocaine  (LMX) 4 % cream Apply 1 application topically 3 (three) times daily as needed. 30 g 0   lidocaine -prilocaine  (EMLA ) cream APPLY 1 APPLICATION TOPICALLY AS NEEDED. 30 g 1   lidocaine -prilocaine  (EMLA ) cream Apply to affected area once 30 g 3   lisinopril  (ZESTRIL ) 40 MG tablet TAKE 1 TABLET BY MOUTH EVERY DAY 90 tablet 1  loperamide  (IMODIUM ) 2 MG capsule Take 2 tabs by mouth with first loose stool, then 1 tab with each additional loose stool as needed. Do not exceed 8 tabs in a 24-hour period 60 capsule 3   ondansetron  (ZOFRAN ) 8 MG tablet Take 1 tablet (8 mg total) by mouth every 8 (eight) hours as needed for nausea or vomiting. 20 tablet 2   ondansetron  (ZOFRAN ) 8 MG tablet Take 1 tablet (8 mg total) by mouth every 8 (eight) hours as needed for nausea, vomiting or refractory nausea / vomiting. Start on the third day after chemotherapy. 30 tablet 1   polycarbophil (FIBERCON) 625 MG tablet Take 1 tablet (625 mg total) by mouth daily. 30 tablet 0   predniSONE  (DELTASONE ) 5 MG tablet 12 day taper: 6,6,5,5,4,4,3,3,2,2,1,1 42 tablet 0   prochlorperazine  (COMPAZINE ) 10 MG tablet Take 1 tablet (10 mg total) by mouth  every 6 (six) hours as needed for nausea or vomiting. 30 tablet 2   prochlorperazine  (COMPAZINE ) 10 MG tablet Take 1 tablet (10 mg total) by mouth every 6 (six) hours as needed for nausea or vomiting. 30 tablet 1   No current facility-administered medications for this visit.   Facility-Administered Medications Ordered in Other Visits  Medication Dose Route Frequency Provider Last Rate Last Admin   0.9 %  sodium chloride  infusion   Intravenous Continuous [name], MD 10 mL/hr at 10/27/23 1222 New Bag at 10/27/23 1222   fluorouracil  (ADRUCIL ) 5,000 mg in sodium chloride  0.9 % 150 mL chemo infusion  2,400 mg/m2 (Treatment Plan Recorded) Intravenous 1 day or 1 dose [name], MD       heparin  lock flush 100 unit/mL  500 Units Intracatheter Once PRN [name], MD       irinotecan  (CAMPTOSAR ) 400 mg in sodium chloride  0.9 % 500 mL chemo infusion  180 mg/m2 (Treatment Plan Recorded) Intravenous Once [name], MD 347 mL/hr at 10/27/23 1327 400 mg at 10/27/23 1327   leucovorin  912 mg in sodium chloride  0.9 % 250 mL infusion  400 mg/m2 (Treatment Plan Recorded) Intravenous Once [name], MD 197 mL/hr at 10/27/23 1328 912 mg at 10/27/23 1328   sodium chloride  flush (NS) 0.9 % injection 10 mL  10 mL Intracatheter PRN [name], MD   10 mL at 06/06/20 1051   sodium chloride  flush (NS) 0.9 % injection 10 mL  10 mL Intracatheter PRN [name], MD        PHYSICAL EXAMINATION: ECOG PERFORMANCE STATUS: 1 - Symptomatic but completely ambulatory  Vitals:   10/27/23 1149 10/27/23 1150  BP: (!) 160/70 (!) 148/70  Pulse: 75   Resp: 18   Temp: 98 F (36.7 C)   SpO2: 99%    Wt Readings from Last 3 Encounters:  10/27/23 236 lb 14.4 oz (107.5 kg)  10/06/23 239 lb 12.8 oz (108.8 kg)  09/15/23 238 lb 8 oz (108.2 kg)     GENERAL:alert, no distress and comfortable SKIN: skin color, texture, turgor are normal, no rashes or significant lesions EYES: normal, Conjunctiva are pink and non-injected, sclera  clear NECK: supple, thyroid  normal size, non-tender, without nodularity LYMPH:  no palpable lymphadenopathy in the cervical, axillary  LUNGS: clear to auscultation and percussion with normal breathing effort HEART: regular rate & rhythm and no murmurs and no lower extremity edema ABDOMEN:abdomen soft, non-tender and normal bowel sounds Musculoskeletal:no cyanosis of digits and no clubbing  NEURO: alert & oriented x 3 with fluent speech, no focal motor/sensory deficits  Physical Exam  LABORATORY DATA:  I have reviewed the data as listed    Latest Ref Rng & Units 10/27/2023   11:23 AM 10/06/2023   12:48 PM 09/15/2023    2:03 PM  CBC  WBC 4.0 - 10.5 K/uL 6.0  6.9  7.3   Hemoglobin 12.0 - 15.0 g/dL 88.0  88.0  88.7   Hematocrit 36.0 - 46.0 % 35.9  35.5  33.8   Platelets 150 - 400 K/uL 150  179  177         Latest Ref Rng & Units 10/27/2023   11:23 AM 10/06/2023   12:49 PM 09/15/2023    2:35 PM  CMP  Glucose 70 - 99 mg/dL 75  71  86   BUN 8 - 23 mg/dL 23  25  25    Creatinine 0.44 - 1.00 mg/dL 9.04  9.07  9.14   Sodium 135 - 145 mmol/L 136  135  138   Potassium 3.5 - 5.1 mmol/L 4.5  4.3  4.0   Chloride 98 - 111 mmol/L 106  106  106   CO2 22 - 32 mmol/L 24  25  23    Calcium  8.9 - 10.3 mg/dL 89.9  9.7  9.4   Total Protein 6.5 - 8.1 g/dL 8.0  7.6  7.1   Total Bilirubin 0.0 - 1.2 mg/dL 0.9  0.5  0.7   Alkaline Phos 38 - 126 U/L 94  98  78   AST 15 - 41 U/L 28  22  22    ALT 0 - 44 U/L 18  13  16        RADIOGRAPHIC STUDIES: I have personally reviewed the radiological images as listed and agreed with the findings in the report. No results found.    No orders of the defined types were placed in this encounter.  All questions were answered. The patient knows to call the clinic with any problems, questions or concerns. No barriers to learning was detected. The total time spent in the appointment was 25 minutes, including review of chart and various tests results, discussions  about plan of care and coordination of care plan     Onita Mattock, MD 10/27/2023

## 2023-10-27 NOTE — Patient Instructions (Addendum)
 [facility] - A DEPT OF [facility]  Discharge Instructions: Thank you for choosing [facility] to provide your oncology and hematology care.   If you have a lab appointment with the Cancer Center, please go directly to the Cancer Center and check in at the registration area.   Wear comfortable clothing and clothing appropriate for easy access to any Portacath or PICC line.   We strive to give you quality time with your provider. You may need to reschedule your appointment if you arrive late (15 or more minutes).  Arriving late affects you and other patients whose appointments are after yours.  Also, if you miss three or more appointments without notifying the office, you may be dismissed from the clinic at the provider's discretion.      For prescription refill requests, have your pharmacy contact our office and allow 72 hours for refills to be completed.    Today you received the following chemotherapy and/or immunotherapy agents: Bevacizumab , Irinotecan , Leucovorin , 67fU      To help prevent nausea and vomiting after your treatment, we encourage you to take your nausea medication as directed.  BELOW ARE SYMPTOMS THAT SHOULD BE REPORTED IMMEDIATELY: *FEVER GREATER THAN 100.4 F (38 C) OR HIGHER *CHILLS OR SWEATING *NAUSEA AND VOMITING THAT IS NOT CONTROLLED WITH YOUR NAUSEA MEDICATION *UNUSUAL SHORTNESS OF BREATH *UNUSUAL BRUISING OR BLEEDING *URINARY PROBLEMS (pain or burning when urinating, or frequent urination) *BOWEL PROBLEMS (unusual diarrhea, constipation, pain near the anus) TENDERNESS IN MOUTH AND THROAT WITH OR WITHOUT PRESENCE OF ULCERS (sore throat, sores in mouth, or a toothache) UNUSUAL RASH, SWELLING OR PAIN  UNUSUAL VAGINAL DISCHARGE OR ITCHING   Items with * indicate a potential emergency and should be followed up as soon as possible or go to the Emergency Department if any problems should occur.  Please show the  CHEMOTHERAPY ALERT CARD or IMMUNOTHERAPY ALERT CARD at check-in to the Emergency Department and triage nurse.  Should you have questions after your visit or need to cancel or reschedule your appointment, please contact [facility] - A DEPT OF [facility]  Dept: [phone]  and follow the prompts.  Office hours are 8:00 a.m. to 4:30 p.m. Monday - Friday. Please note that voicemails left after 4:00 p.m. may not be returned until the following business day.  We are closed weekends and major holidays. You have access to a nurse at all times for urgent questions. Please call the main number to the clinic Dept: [phone] and follow the prompts.   For any non-urgent questions, you may also contact your provider using MyChart. We now offer e-Visits for anyone 53 and older to request care online for non-urgent symptoms. For details visit [email or web address].   Also download the MyChart app! Go to the app store, search MyChart, open the app, select [place], and log in with your MyChart username and password.  Irinotecan  Injection Quel est ce mdicament ? L?IRINOTCAN traite certains types de cancer. Il agit en ralentissant la croissance des cellules cancreuses. Ce mdicament peut tre utilis pour d'autres indications ; adressez-vous  votre mdecin ou  votre pharmacien si vous avez des questions. NOM(S) DE MARQUE COURANT(S) : Camptosar  Que dois-je dire  mon fournisseur de [employer] de prendre ce mdicament ? Votre quipe de soins a besoin de savoir si vous tes dans l?une des situations suivantes : Dshydratation Diarrhe Infection, en particulier une infection virale,  telle que la varicelle, les boutons de fivre et l'herps Maladie du foie Faible taux de cellules sanguines (globules blancs, globules rouges et plaquettes) Faibles taux d'lectrolytes sanguins, tels que le calcium , le magnsium ou le potassium Radiothrapie rcente ou en cours Raction  inhabituelle ou allergique  l?irinotcan,  d?autres mdicaments,  des aliments,  des colorants ou  des conservateurs Vous ou votre partenaire tes enceinte ou essayez de concevoir un enfant Allaitement Comment dois-je utiliser ce mdicament ? Ce mdicament est destin  tre inject dans une veine. Il est administr par votre quipe de [employer] un tablissement [employer] ou un Biochemist, clinical. [employer] toute information relative  l'utilisation de ce mdicament [employer], Electrical engineer votre quipe de [place]. Des prcautions particulires peuvent s?avrer ncessaires. Surdosage : si vous pensez avoir pris ce mdicament en excs, contactez immdiatement un centre [employer].<br />REMARQUE : ce mdicament vous est uniquement destin. Ne partagez pas ce Arts administrator. Que faire si je saute une dose ? Prsentez-vous  tous les rendez-vous pour recevoir les doses suivantes. Il est important de ne pas omettre votre dose. Appelez votre quipe de soins si vous ne pouvez pas Barrister's clerk. Quelles sont les interactions possibles avec ce mdicament ? Ne prenez pas ce [employer] substances suivantes : Cobicistat Itraconazole Ce mdicament peut galement interagir avec les substances suivantes : Certains antibiotiques, tels que la clarithromycine, la rifampicine et la rifabutine Certains antiviraux contre le VIH ou le SIDA Certains mdicaments contre les infections fongiques, tels que le ktoconazole, le posaconazole et le voriconazole Certains mdicaments contre les crises d'pilepsie, tels que la carbamazpine, le phnobarbital et la phnytone Gemfibrozil Nfazodone Millepertuis ([place]. John?s Wort) Cette liste peut ne pas dcrire toutes les interactions mdicamenteuses possibles. Donnez  votre fournisseur de [employer] de tous les mdicaments, plantes mdicinales, mdicaments en vente libre ou complments alimentaires que vous  prenez. Dites-lui aussi si vous fumez, buvez de l'alcool ou consommez des drogues illicites. Certaines substances peuvent interagir avec votre mdicament. Que dois-je IT consultant par ce mdicament ? Votre tat de sant sera surveill attentivement durant le traitement par [facility]. Vous devrez Geographical information systems officer des analyses de sang Art therapist par [employer] [facility] [employer]. Ce mdicament peut entraner une sensation de malaise gnral. Cela n?a rien d?exceptionnel puisque la chimiothrapie est susceptible d?affecter aussi bien les cellules saines que les cellules cancreuses. Signalez Product/process development scientist. Continuez votre traitement mme si vous vous sentez malade, sauf si votre quipe de soins vous demande de Transport planner. Ce mdicament peut provoquer de graves effets secondaires. Pour rduire [employer], votre quipe de soins peut vous donner d?autres mdicaments  prendre avant de recevoir celui-ci. Veillez  suivre les instructions fournies par votre quipe de soins. Ce mdicament peut affecter votre coordination, votre temps de raction ou votre jugement. Ne conduisez pas ou n'utilisez pas de machines tant que vous ignorez l'effet que ce mdicament a sur vous. Redressez-vous ou levez-vous lentement pour rduire [employer] de vertiges ou d'vanouissements. La Public affairs consultant ce mdicament peut accrotre le risque de survenue de ces effets secondaires. Ce mdicament peut accrotre votre risque de contracter une infection. Demandez conseil  votre quipe de soins si vous avez de la fivre, des frissons, un mal de gorge ou d'autres symptmes de rhume ou de grippe. Ne pratiquez pas l?automdication. vitez de vous approcher des [employer]. vitez de prendre des [employer] de l'aspirine, du paractamol (acetaminophen ), de l'ibuprofne, du naproxne  ou du ktoprofne, sauf indication contraire de votre quipe de [place]. Ces mdicaments peuvent  masquer une fivre. Ce mdicament peut accrotre le risque d?hmatomes (bleus) ou de saignement. Appelez votre quipe de soins si vous remarquez tout saignement anormal. Buddy prudent(e) lorsque vous vous brossez les dents ou que vous utilisez du fil dentaire ou un cure-dents, car vous pouvez dvelopper une infection ou saigner plus facilement. Si vous devez [employer], dites  votre dentiste que vous tes sous traitement par [facility]. Si vous ou votre partenaire tes ou pensez tre enceinte, dites-le  votre quipe de soins. Ce mdicament peut provoquer de graves anomalies congnitales en cas de prise au cours de la grossesse et pendant les 6 mois qui suivent la dernire dose. Vous devrez effectuer un test de grossesse et avoir un rsultat ngatif avant de Marketing executive par [facility]. Il est recommand d?utiliser une mthode de contraception Warehouse manager par ce mdicament et au cours des 6 mois qui suivent la dernire dose. Votre quipe de Surveyor, quantity  trouver l?option Writer. Vous ne devez pas concevoir un enfant Warehouse manager par ce mdicament ni au cours des 3 mois suivant la prise de la dernire dose. Jacquiline un prservatif comme mthode de contraception pendant cette priode. N?allaitez pas Art therapist par ce mdicament ni pendant 7 jours aprs la prise de la dernire dose. Ce mdicament peut entraner une strilit. Consultez votre quipe de soins si vous avez des inquitudes Oncologist. Quels effets secondaires puis-je remarquer en prenant ce mdicament ? Effets secondaires que vous devez signaler  votre quipe de soins ds que possible : Ractions allergiques : ruption cutane, dmangeaisons, urticaire, gonflement du visage, des lvres, de la langue ou de la gorge Toux sche, essoufflements ou difficults  respirer Augmentation de la salive ou des larmes, transpiration accrue, crampes  d?estomac, diarrhe, rtrcissement des pupilles, faiblesse ou fatigue inhabituelle, rythme cardiaque lent Infection : fivre, frissons, toux, mal de gorge, plaies qui ne cicatrisent pas, douleurs ou difficults pour uriner, sensation d?inconfort ou malaise gnral Atteinte des reins : miction (mission d'urine) rduite, gonflement des [place], des mains ou des pieds Faible numration des globules rouges : faiblesse ou fatigue inhabituelle, vertiges, mal de tte, difficults  respirer Diarrhe svre ou prolonge Saignements ou ecchymoses (bleus) inhabituels Effets secondaires ne ncessitant gnralement pas un avis mdical (signalez-les  votre quipe de soins s'ils persistent ou sont gnants) : Constipation Diarrhe Perte des cheveux/poils Perte d'apptit Nauses Douleurs  l?estomac Cette liste peut ne pas dcrire tous les effets secondaires possibles. Appelez votre mdecin pour lui demander conseil au sujet des [employer]. Vous pouvez signaler les effets secondaires  la FDA, au [phone]. O dois-je conserver mon mdicament ? Ce mdicament est administr dans un tablissement [employer]. Il ne sera pas conserv  domicile. REMARQUE : cette notice est un rsum. Elle peut ne pas contenir toutes les informations possibles. Si vous avez des questions  propos de ce mdicament, Patent attorney mdecin, votre pharmacien ou votre fournisseur de [place].  2024 Elsevier/Gold Standard (2022-02-10 00:00:00)  Leucovorin  Tablets What is this medication? LEUCOVORIN  (loo koe VOR in) prevents side effects from certain medications, such as methotrexate. It works by increasing folate levels. This helps protect healthy cells in your body. The timing of this medication is important. Follow your care team's instructions. This medicine may be used for other purposes; ask your health care provider or pharmacist if you  have questions. What should I tell my care team before  I take this medication? They need to know if you have any of these conditions: Anemia from low levels of vitamin B12 in the blood Frequent vomiting and diarrhea An unusual or allergic reaction to leucovorin , folic acid, other medications, foods, dyes, or preservatives Pregnant or trying to get pregnant Breastfeeding How should I use this medication? Take this medication by mouth with a glass of water . Take it as directed on the prescription label. Keep taking it unless your care team tells you to stop. Talk to your care team about the use of this medication in children. Special care may be needed. Overdosage: If you think you have taken too much of this medicine contact a poison control center or emergency room at once. NOTE: This medicine is only for you. Do not share this medicine with others. What if I miss a dose? If you miss a dose, take it as soon as you can. If it is almost time for your next dose, take only that dose. Do not take double or extra doses. What may interact with this medication? Capecitabine  Fluorouracil  Phenobarbital Phenytoin Primidone Trimethoprim;sulfamethoxazole This list may not describe all possible interactions. Give your health care provider a list of all the medicines, herbs, non-prescription drugs, or dietary supplements you use. Also tell them if you smoke, drink alcohol, or use illegal drugs. Some items may interact with your medicine. What should I watch for while using this medication? Your condition will be monitored carefully while you are receiving this medication. You may need blood work while taking this medication. Tell your care team if you have nausea or vomiting regularly. What side effects may I notice from receiving this medication? Side effects that you should report to your care team as soon as possible: Allergic reactions--skin rash, itching, hives, swelling of the face, lips, tongue, or throat This list may not describe all possible side  effects. Call your doctor for medical advice about side effects. You may report side effects to FDA at 1-800-FDA-1088. Where should I keep my medication? Keep out of the reach of children and pets. Store at room temperature between 20 and 25 degrees C (68 and 77 degrees F). Protect from light and moisture. Get rid of any unused medication after the expiration date. To get rid of medications that are no longer needed or have expired: Take the medication to a medication take-back program. Check with your pharmacy or law enforcement to find a location. If you cannot return the medication, ask your pharmacist or care team how to get rid of this medication safely. NOTE: This sheet is a summary. It may not cover all possible information. If you have questions about this medicine, talk to your doctor, pharmacist, or health care provider.  2024 Elsevier/Gold Standard (2021-08-25 00:00:00)  Fluorouracil  Injection What is this medication? FLUOROURACIL  (flure oh YOOR a sil) treats some types of cancer. It works by slowing down the growth of cancer cells. This medicine may be used for other purposes; ask your health care provider or pharmacist if you have questions. COMMON BRAND NAME(S): Adrucil  What should I tell my care team before I take this medication? They need to know if you have any of these conditions: Blood disorders Dihydropyrimidine dehydrogenase (DPD) deficiency Infection, such as chickenpox, cold sores, herpes Kidney disease Liver disease Poor nutrition Recent or ongoing radiation therapy An unusual or allergic reaction to fluorouracil , other medications, foods, dyes, or preservatives If you or your  partner are pregnant or trying to get pregnant Breast-feeding How should I use this medication? This medication is injected into a vein. It is administered by your care team in a hospital or clinic setting. Talk to your care team about the use of this medication in children. Special care  may be needed. Overdosage: If you think you have taken too much of this medicine contact a poison control center or emergency room at once. NOTE: This medicine is only for you. Do not share this medicine with others. What if I miss a dose? Keep appointments for follow-up doses. It is important not to miss your dose. Call your care team if you are unable to keep an appointment. What may interact with this medication? Do not take this medication with any of the following: Live virus vaccines This medication may also interact with the following: Medications that treat or prevent blood clots, such as warfarin, enoxaparin , dalteparin This list may not describe all possible interactions. Give your health care provider a list of all the medicines, herbs, non-prescription drugs, or dietary supplements you use. Also tell them if you smoke, drink alcohol, or use illegal drugs. Some items may interact with your medicine. What should I watch for while using this medication? Your condition will be monitored carefully while you are receiving this medication. This medication may make you feel generally unwell. This is not uncommon as chemotherapy can affect healthy cells as well as cancer cells. Report any side effects. Continue your course of treatment even though you feel ill unless your care team tells you to stop. In some cases, you may be given additional medications to help with side effects. Follow all directions for their use. This medication may increase your risk of getting an infection. Call your care team for advice if you get a fever, chills, sore throat, or other symptoms of a cold or flu. Do not treat yourself. Try to avoid being around people who are sick. This medication may increase your risk to bruise or bleed. Call your care team if you notice any unusual bleeding. Be careful brushing or flossing your teeth or using a toothpick because you may get an infection or bleed more easily. If you have  any dental work done, tell your dentist you are receiving this medication. Avoid taking medications that contain aspirin, acetaminophen , ibuprofen , naproxen, or ketoprofen unless instructed by your care team. These medications may hide a fever. Do not treat diarrhea with over the counter products. Contact your care team if you have diarrhea that lasts more than 2 days or if it is severe and watery. This medication can make you more sensitive to the sun. Keep out of the sun. If you cannot avoid being in the sun, wear protective clothing and sunscreen. Do not use sun lamps, tanning beds, or tanning booths. Talk to your care team if you or your partner wish to become pregnant or think you might be pregnant. This medication can cause serious birth defects if taken during pregnancy and for 3 months after the last dose. A reliable form of contraception is recommended while taking this medication and for 3 months after the last dose. Talk to your care team about effective forms of contraception. Do not father a child while taking this medication and for 3 months after the last dose. Use a condom while having sex during this time period. Do not breastfeed while taking this medication. This medication may cause infertility. Talk to your care team if you are  concerned about your fertility. What side effects may I notice from receiving this medication? Side effects that you should report to your care team as soon as possible: Allergic reactions--skin rash, itching, hives, swelling of the face, lips, tongue, or throat Heart attack--pain or tightness in the chest, shoulders, arms, or jaw, nausea, shortness of breath, cold or clammy skin, feeling faint or lightheaded Heart failure--shortness of breath, swelling of the ankles, feet, or hands, sudden weight gain, unusual weakness or fatigue Heart rhythm changes--fast or irregular heartbeat, dizziness, feeling faint or lightheaded, chest pain, trouble breathing High  ammonia level--unusual weakness or fatigue, confusion, loss of appetite, nausea, vomiting, seizures Infection--fever, chills, cough, sore throat, wounds that don't heal, pain or trouble when passing urine, general feeling of discomfort or being unwell Low red blood cell level--unusual weakness or fatigue, dizziness, headache, trouble breathing Pain, tingling, or numbness in the hands or feet, muscle weakness, change in vision, confusion or trouble speaking, loss of balance or coordination, trouble walking, seizures Redness, swelling, and blistering of the skin over hands and feet Severe or prolonged diarrhea Unusual bruising or bleeding Side effects that usually do not require medical attention (report to your care team if they continue or are bothersome): Dry skin Headache Increased tears Nausea Pain, redness, or swelling with sores inside the mouth or throat Sensitivity to light Vomiting This list may not describe all possible side effects. Call your doctor for medical advice about side effects. You may report side effects to FDA at 1-800-FDA-1088. Where should I keep my medication? This medication is given in a hospital or clinic. It will not be stored at home. NOTE: This sheet is a summary. It may not cover all possible information. If you have questions about this medicine, talk to your doctor, pharmacist, or health care provider.  2024 Elsevier/Gold Standard (2021-07-28 00:00:00)

## 2023-10-27 NOTE — Assessment & Plan Note (Signed)
 [id number] stage IV with liver and nodal metastasis, MSS, KRAS G12S(+)  -Diagnosed in 01/2019 after emergent colectomy and liver biopsy. 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  -Started first line chemo on 03/12/2019, received 5FU/leuc with first 2 cycles for large open abdominal wound after surgery. She started full dose FOLFOX and avastin  with cycle 2. Due to neuropathy Oxaliplatin  was stopped after 09/24/19.  --For convenience she was switched to oral chemo Xeloda  2 weeks on/1 week off and Bev every 3 weeks in 01/21/20. She is tolerating well -Her restaging CT scan from 03/11/2023 showed stable liver metastasis, no other new lesions.  -CT 09/29/2023 showed mild disease progression in liver  -will change her treatment to FOLFIRI and beva

## 2023-10-28 ENCOUNTER — Telehealth: Payer: Self-pay

## 2023-10-28 ENCOUNTER — Other Ambulatory Visit: Payer: Self-pay

## 2023-10-28 NOTE — Telephone Encounter (Signed)
 Spoke with pt via telephone regarding headache.  Pt stated she's having headaches since she started the new chemo regimen on 10/27/2023.  Pt stated she's taking Acetaminophen  for the headaches but they do not seem to be working.  Pt wants to know if there is something else she can take.  Pt got Bevacizumab  on 10/27/2023 along with FOLFIRI.  Asked pt had she taken her BP today.  Pt stated NO.  Pt stated she also has not taken her BP medications today as well.  Instructed pt to check her BP and take her BP medications.  Stated that if the BP is elevated to please contact her PCP for further guidance on how to adjust her BP medications.  Pt verbalized understanding and had no further questions or concerns.  Notified Dr. Lanny and Team of the pt's call.

## 2023-10-29 ENCOUNTER — Inpatient Hospital Stay

## 2023-10-29 VITALS — BP 173/78 | HR 45 | Temp 97.3°F | Resp 16

## 2023-10-29 DIAGNOSIS — Z5112 Encounter for antineoplastic immunotherapy: Secondary | ICD-10-CM | POA: Diagnosis not present

## 2023-10-29 DIAGNOSIS — Z79899 Other long term (current) drug therapy: Secondary | ICD-10-CM | POA: Diagnosis not present

## 2023-10-29 DIAGNOSIS — C189 Malignant neoplasm of colon, unspecified: Secondary | ICD-10-CM

## 2023-10-29 DIAGNOSIS — C184 Malignant neoplasm of transverse colon: Secondary | ICD-10-CM | POA: Diagnosis not present

## 2023-10-29 DIAGNOSIS — C779 Secondary and unspecified malignant neoplasm of lymph node, unspecified: Secondary | ICD-10-CM | POA: Diagnosis not present

## 2023-10-29 DIAGNOSIS — C787 Secondary malignant neoplasm of liver and intrahepatic bile duct: Secondary | ICD-10-CM | POA: Diagnosis not present

## 2023-10-29 DIAGNOSIS — Z5111 Encounter for antineoplastic chemotherapy: Secondary | ICD-10-CM | POA: Diagnosis not present

## 2023-10-29 MED ORDER — HEPARIN SOD (PORK) LOCK FLUSH 100 UNIT/ML IV SOLN
500.0000 [IU] | Freq: Once | INTRAVENOUS | Status: AC | PRN
  Administered 2023-10-29: 500 [IU]

## 2023-10-29 MED ORDER — SODIUM CHLORIDE 0.9% FLUSH
10.0000 mL | INTRAVENOUS | Status: DC | PRN
  Administered 2023-10-29: 10 mL

## 2023-11-09 NOTE — Assessment & Plan Note (Signed)
 [id number] stage IV with liver and nodal metastasis, MSS, KRAS G12S(+)  -Diagnosed in 01/2019 after emergent colectomy and liver biopsy. 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  -Started first line chemo on 03/12/2019, received 5FU/leuc with first 2 cycles for large open abdominal wound after surgery. She started full dose FOLFOX and avastin  with cycle 2. Due to neuropathy Oxaliplatin  was stopped after 09/24/19.  --For convenience she was switched to oral chemo Xeloda  2 weeks on/1 week off and Bev every 3 weeks in 01/21/20. She is tolerating well -Her restaging CT scan from 03/11/2023 showed stable liver metastasis, no other new lesions.  -CT 09/29/2023 showed mild disease progression in liver  -treatment changed to FOLFIRI and beva on 10/27/2023

## 2023-11-10 ENCOUNTER — Inpatient Hospital Stay

## 2023-11-10 ENCOUNTER — Inpatient Hospital Stay (HOSPITAL_BASED_OUTPATIENT_CLINIC_OR_DEPARTMENT_OTHER): Admitting: Hematology

## 2023-11-10 ENCOUNTER — Inpatient Hospital Stay: Attending: Hematology

## 2023-11-10 VITALS — BP 134/58 | HR 77 | Temp 97.9°F | Resp 16 | Ht 68.0 in | Wt 245.4 lb

## 2023-11-10 DIAGNOSIS — Z5111 Encounter for antineoplastic chemotherapy: Secondary | ICD-10-CM | POA: Insufficient documentation

## 2023-11-10 DIAGNOSIS — Z5189 Encounter for other specified aftercare: Secondary | ICD-10-CM | POA: Diagnosis not present

## 2023-11-10 DIAGNOSIS — C787 Secondary malignant neoplasm of liver and intrahepatic bile duct: Secondary | ICD-10-CM | POA: Diagnosis not present

## 2023-11-10 DIAGNOSIS — Z79899 Other long term (current) drug therapy: Secondary | ICD-10-CM | POA: Insufficient documentation

## 2023-11-10 DIAGNOSIS — C189 Malignant neoplasm of colon, unspecified: Secondary | ICD-10-CM

## 2023-11-10 DIAGNOSIS — Z5112 Encounter for antineoplastic immunotherapy: Secondary | ICD-10-CM | POA: Insufficient documentation

## 2023-11-10 DIAGNOSIS — Z95828 Presence of other vascular implants and grafts: Secondary | ICD-10-CM

## 2023-11-10 DIAGNOSIS — C184 Malignant neoplasm of transverse colon: Secondary | ICD-10-CM | POA: Insufficient documentation

## 2023-11-10 LAB — CMP (CANCER CENTER ONLY)
ALT: 13 U/L (ref 0–44)
AST: 17 U/L (ref 15–41)
Albumin: 3.8 g/dL (ref 3.5–5.0)
Alkaline Phosphatase: 88 U/L (ref 38–126)
Anion gap: 4 — ABNORMAL LOW (ref 5–15)
BUN: 22 mg/dL (ref 8–23)
CO2: 25 mmol/L (ref 22–32)
Calcium: 9.2 mg/dL (ref 8.9–10.3)
Chloride: 109 mmol/L (ref 98–111)
Creatinine: 0.91 mg/dL (ref 0.44–1.00)
GFR, Estimated: >60 mL/min (ref >60)
Glucose, Bld: 76 mg/dL (ref 70–99)
Potassium: 4.4 mmol/L (ref 3.5–5.1)
Sodium: 138 mmol/L (ref 135–145)
Total Bilirubin: 0.7 mg/dL (ref 0.0–1.2)
Total Protein: 7 g/dL (ref 6.5–8.1)

## 2023-11-10 LAB — CBC WITH DIFFERENTIAL (CANCER CENTER ONLY)
Abs Immature Granulocytes: 0 K/uL (ref 0.00–0.07)
Basophils Absolute: 0 K/uL (ref 0.0–0.1)
Basophils Relative: 1 %
Eosinophils Absolute: 0.2 K/uL (ref 0.0–0.5)
Eosinophils Relative: 8 %
HCT: 32.3 % — ABNORMAL LOW (ref 36.0–46.0)
Hemoglobin: 10.7 g/dL — ABNORMAL LOW (ref 12.0–15.0)
Immature Granulocytes: 0 %
Lymphocytes Relative: 45 %
Lymphs Abs: 1.3 K/uL (ref 0.7–4.0)
MCH: 33.1 pg (ref 26.0–34.0)
MCHC: 33.1 g/dL (ref 30.0–36.0)
MCV: 100 fL (ref 80.0–100.0)
Monocytes Absolute: 0.3 K/uL (ref 0.1–1.0)
Monocytes Relative: 10 %
Neutro Abs: 1 K/uL — ABNORMAL LOW (ref 1.7–7.7)
Neutrophils Relative %: 36 %
Platelet Count: 129 K/uL — ABNORMAL LOW (ref 150–400)
RBC: 3.23 MIL/uL — ABNORMAL LOW (ref 3.87–5.11)
RDW: 15.7 % — ABNORMAL HIGH (ref 11.5–15.5)
WBC Count: 2.9 K/uL — ABNORMAL LOW (ref 4.0–10.5)
nRBC: 0 % (ref 0.0–0.2)

## 2023-11-10 LAB — CEA (ACCESS): CEA (CHCC): 343.5 ng/mL — ABNORMAL HIGH (ref 0.00–5.00)

## 2023-11-10 LAB — TOTAL PROTEIN, URINE DIPSTICK: Protein, ur: NEGATIVE mg/dL

## 2023-11-10 MED ORDER — SODIUM CHLORIDE 0.9% FLUSH
10.0000 mL | INTRAVENOUS | Status: DC | PRN
  Administered 2023-11-10: 10 mL

## 2023-11-10 MED ORDER — SODIUM CHLORIDE 0.9 % IV SOLN
180.0000 mg/m2 | Freq: Once | INTRAVENOUS | Status: AC
  Administered 2023-11-10: 400 mg via INTRAVENOUS
  Filled 2023-11-10: qty 15

## 2023-11-10 MED ORDER — PALONOSETRON HCL INJECTION 0.25 MG/5ML
0.2500 mg | Freq: Once | INTRAVENOUS | Status: AC
  Administered 2023-11-10: 0.25 mg via INTRAVENOUS
  Filled 2023-11-10: qty 5

## 2023-11-10 MED ORDER — SODIUM CHLORIDE 0.9 % IV SOLN: INTRAVENOUS | Status: DC

## 2023-11-10 MED ORDER — SODIUM CHLORIDE 0.9 % IV SOLN
2200.0000 mg/m2 | INTRAVENOUS | Status: DC
  Administered 2023-11-10: 5000 mg via INTRAVENOUS
  Filled 2023-11-10: qty 100

## 2023-11-10 MED ORDER — SODIUM CHLORIDE 0.9 % IV SOLN
400.0000 mg/m2 | Freq: Once | INTRAVENOUS | Status: AC
  Administered 2023-11-10: 912 mg via INTRAVENOUS
  Filled 2023-11-10: qty 45.6

## 2023-11-10 MED ORDER — DEXAMETHASONE SODIUM PHOSPHATE 10 MG/ML IJ SOLN
10.0000 mg | Freq: Once | INTRAMUSCULAR | Status: AC
  Administered 2023-11-10: 10 mg via INTRAVENOUS
  Filled 2023-11-10: qty 1

## 2023-11-10 MED ORDER — SODIUM CHLORIDE 0.9 % IV SOLN
5.0000 mg/kg | Freq: Once | INTRAVENOUS | Status: AC
  Administered 2023-11-10: 500 mg via INTRAVENOUS
  Filled 2023-11-10: qty 4

## 2023-11-10 NOTE — Progress Notes (Signed)
 Verbal and Transcribed order w/readback from Dr. Lanny.  OK To tx w/C2D1 FOLFIRI + Bevacizumab  w/ANC 1.0 Urine Protein 30.  Pt will get GCSF for ANC of 1.0. CMP is pending and Infusion nurse will notify Dr. Lanny is CMP parameters are outside tx range for further directions.

## 2023-11-10 NOTE — Progress Notes (Signed)
 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  -Started first line chemo on 03/12/2019, received 5FU/leuc with first 2 cycles for large open abdominal wound after surgery. She started full dose FOLFOX and avastin  with cycle 2. Due to neuropathy Oxaliplatin  was stopped after 09/24/19.  --For convenience she was switched to oral chemo Xeloda  2 weeks on/1 week off and Bev every 3 weeks in 01/21/20. She is tolerating well -Her restaging CT scan from 03/11/2023 showed stable liver metastasis, no other new lesions.  -CT 09/29/2023 showed mild disease progression in liver  -treatment changed to FOLFIRI and beva on 10/27/2023  Assessment & Plan Metastatic colon cancer with hepatic involvement Undergoing chemotherapy for metastatic colon cancer with hepatic involvement. Reported transient back pain, likely muscular, resolved spontaneously. Tolerated last cycle of chemotherapy well with mild headache and transient diarrhea. Previous CEA elevation prompted treatment change. Repeat CEA test conducted today to assess response to current regimen. - Reduce chemotherapy dose by 10% for the next cycle. - Repeat CEA test after cycle five or six to  evaluate treatment response. - Schedule next chemotherapy session for November 24, 2023.  Chemotherapy-induced leukopenia Low white blood cell count secondary to chemotherapy, increasing infection risk. Has not previously received growth factor injections. Growth factor injection proposed to stimulate white blood cell production and reduce infection risk. Informed of potential bone pain as a side effect, with Tylenol  and Claritin recommended for management. - [employer] approval for growth factor injection. - Administer growth factor injection subcutaneously in the abdomen to stimulate white blood cell production. - Instruct to take Claritin on the day of injection and for five days thereafter to mitigate bone pain associated with the growth factor injection. - Advise to take Tylenol  for bone pain if needed.  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 Bowel-gas pattern consistent with small bowel obstruction. No free air. 2. No acute chest findings.   01/23/2019 Imaging   CT AP IMPRESSION: Obstructing mid transverse colonic mass with mild regional adenopathy and hepatic metastatic disease. The mass likely extends through the serosa; no ascites or peritoneal nodularity.   01/24/2019 Surgery   Surgeon: [name] Signe MD Assistant: Harlene Ferraris PA-C Procedure performed: Transverse colectomy with end colostomy, liver biopsy Procedure  classification: URGENT/EMERGENT Preop diagnosis: Obstructing, metastatic transverse colon mass Post-op diagnosis/intraop findings: Same   01/24/2019 Pathology Results   FINAL MICROSCOPIC DIAGNOSIS:   A. COLON, TRANSVERSE, RESECTION:  Colonic adenocarcinoma, 5 cm.  Carcinoma extends into pericolonic connective tissue and focally to  serosal surface.  Margins not involved.  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   B. LIVER NODULE, LEFT, BIOPSY:  Metastatic adenocarcinoma.    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 Hypermetabolic metastatic disease in the liver and mediastinal/hilar/axillary lymph nodes. 2. Focal hypermetabolism in the rectum. Continued attention on follow-up exams is warranted. 3. Focal hypermetabolism medial to the right adrenal gland may be within a metastatic lymph node, better visualized on 01/23/2019. 4. Aortic atherosclerosis (ICD10-170.0). Coronary artery calcification.   03/12/2019 -  Chemotherapy   She started 5FU q2weeks on 03/12/19 for 2 cycles. She started full dose FOLFOX with Avastin  on 04/09/19. Oxaliplatin  dose reduced repeatedly due to neuropathy C12 and held since C16 on 10/09/19. Now on maintenance Avastin  and 5FU q2weeks since 10/09/19       -Maintenance change to maintenance xeloda  2000 mg BID days 1-14 q21 days and q3 weeks Zirabev  (15 mg/kg) starting 01/21/20. First cycle was taken 1000mg  BID due to misunderstanding.    05/31/2019 Imaging   Restaging CT CAP IMPRESSION: 1. Similar to mild interval decrease in size of multiple hepatic lesions, partially calcified. 2. 2 mm right upper lobe pulmonary nodule. Recommend attention on follow-up. 3. Emphysema and  aortic atherosclerosis.   08/23/2019 Imaging   CT CAP w contrast  IMPRESSION: 1. The dominant peripheral right liver metastasis has mildly increased. Other smaller liver metastases are stable. 2. Otherwise no new or progressive metastatic disease in the chest, abdomen or pelvis. 3. Aortic Atherosclerosis (ICD10-I70.0) and Emphysema (ICD10-J43.9).   11/27/2019 Imaging   CT CAP w contrast  IMPRESSION: Status post transverse colectomy with right mid abdominal colostomy.   Mildly progressive hepatic metastases, as above.   No evidence of metastatic disease in the chest. Small mediastinal lymph nodes are within normal limits.   Additional stable ancillary findings as above.   01/21/2020 - 10/06/2023 Chemotherapy   Patient is on Treatment Plan : COLORECTAL Bevacizumab  q21d     02/21/2020 Imaging   IMPRESSION: 1. Stable hepatic metastatic disease. 2. Aortic atherosclerosis (ICD10-I70.0). Coronary artery calcification. 3.  Emphysema (ICD10-J43.9).   05/19/2020 Imaging   CT CAP  IMPRESSION: 1. Multiple partially calcified liver metastases are again noted. With the exception of a small lesion in segment 7/8 lesions are not significantly changed in the interval. No new liver lesions identified. 2. Coronary artery atherosclerotic calcifications. 3. Aortic atherosclerosis. 4. 2 mm right upper lobe lung nodule identified.  Unchanged.   Aortic Atherosclerosis (ICD10-I70.0).   11/17/2020 Imaging   CT CAP  IMPRESSION: 1. Partially calcified lesions throughout the liver are stable accounting for differences in technique, contrasted imaging on today's study is compared to noncontrast imaging on the prior. 2. No new hepatic lesions. 3. Tiny 3 mm pulmonary nodule in the RIGHT upper lobe unchanged since the prior study. Attention on follow-up. 4. Mild fullness of RIGHT paratracheal nodal tissue is minimally increased and borderline enlarged, attention on follow-up. 5. RIGHT lower  quadrant colostomy. 6. 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 7. Aortic atherosclerosis.   06/09/2021 Imaging   EXAM: CT CHEST, ABDOMEN, AND PELVIS WITH CONTRAST  IMPRESSION: Stable calcified liver metastases.   Stable mildly enlarged right paratracheal lymph node.   No evidence of new or progressive metastatic disease.   Aortic Atherosclerosis (ICD10-I70.0).   09/14/2021 PET scan   IMPRESSION: Multiple hypermetabolic and partially calcified liver metastases, without significant change compared to most recent CT.   Mild hypermetabolic mediastinal and bilateral hilar lymphadenopathy, also without significant change.   No evidence of new or progressive metastatic disease.     Electronically Signed   By: Norleen DELENA Kil M.D.   On: 09/15/2021 11:04   04/15/2022 Imaging    IMPRESSION: 1. Several previously noted partially calcified hepatic metastases appear grossly stable compared to the prior examination. No new hepatic lesions or other signs of metastatic disease noted elsewhere in the chest, abdomen or pelvis. 2. Aortic atherosclerosis, in addition to left main and three-vessel coronary artery disease. Please note that although the presence of coronary artery calcium  documents the presence of coronary artery disease, the severity of this disease and any potential stenosis cannot be assessed on this non-gated CT examination. Assessment for potential risk factor modification, dietary therapy or pharmacologic therapy may be warranted, if clinically indicated. 3. There are calcifications of the mitral annulus. Echocardiographic correlation for evaluation of potential valvular dysfunction may be warranted if clinically indicated. 4. Additional incidental findings, similar to prior studies, as above.   07/15/2022 Imaging    IMPRESSION: 1. Stable exam. No new  or progressive findings in the abdomen or pelvis to suggest worsening disease. 2. No substantial change in calcified liver metastases. 3. Right abdominal transverse end colostomy with parastomal herniation of fat and colon proximal to the stoma. 4. Probable left-sided uterine fibroid measuring on the order of 3.1 cm and displacing the endometrial stripe to the right. Pelvic ultrasound could be used to further evaluate as clinically warranted. 5.  Aortic Atherosclerosis (ICD10-I70.0).     07/07/2023 Imaging   CT chest, abdomen, and pelvis IMPRESSION: Partially calcified treated hepatic metastatic disease remains stable, except for mild increase in size of a single small low-attenuation lesion in central left hepatic lobe suspicious for enlarging metastasis. Recommend correlation with tumor markers, and consider short-term follow-up by CT in 3-6 months.   No other sites of metastatic disease identified.   Stable uterine fibroid.     10/27/2023 -  Chemotherapy   Patient is on Treatment Plan : COLORECTAL FOLFIRI + Bevacizumab  q14d        Discussed the use of AI scribe software for clinical note transcription with the patient, who gave verbal consent to proceed.  History of Present Illness Casey Black is a 64 year old female with metastatic colon cancer who presents for follow-up.  She experienced headaches during her last cycle of chemotherapy, which resolved with her blood pressure medication. She also had a brief episode of diarrhea, which has since stopped. She denies nausea, fever, chills, or bleeding. Her current medications include blood pressure medication, and she has not required any recent injections or growth factor shots.     All other systems were reviewed with the patient and are negative.  MEDICAL HISTORY:  Past Medical History:  Diagnosis Date   Anemia    low iron   Colon cancer (HCC) 01/2019  Hypertension 01/23/2019   Personal history of chemotherapy  01/2019   colon CA   SBO (small bowel obstruction) (HCC) 01/23/2019    SURGICAL HISTORY: Past Surgical History:  Procedure Laterality Date   CESAREAN SECTION     x2   COLOSTOMY N/A 01/24/2019   Procedure: End Loop Colostomy;  Surgeon: [name], MD;  Location: [facility] OR;  Service: General;  Laterality: N/A;   PARTIAL COLECTOMY N/A 01/24/2019   Procedure: PARTIAL COLECTOMY;  Surgeon: [name], MD;  Location: [facility] OR;  Service: General;  Laterality: N/A;   PORTACATH PLACEMENT Right 02/28/2019   Procedure: INSERTION PORT-A-CATH WITH ULTRASOUND GUIDANCE;  Surgeon: [name], MD;  Location: [facility] OR;  Service: General;  Laterality: Right;    I have reviewed the social history and family history with the patient and they are unchanged from previous note.  ALLERGIES:  has no known allergies.  MEDICATIONS:  Current Outpatient Medications  Medication Sig Dispense Refill   acetaminophen  (TYLENOL ) 325 MG tablet Take 2 tablets (650 mg total) by mouth every 6 (six) hours as needed.     amLODipine  (NORVASC ) 10 MG tablet TAKE 1 TABLET BY MOUTH EVERY DAY 90 tablet 1   benzonatate  (TESSALON ) 100 MG capsule Take 1 capsule (100 mg total) by mouth 2 (two) times daily as needed for cough. 20 capsule 0   capecitabine  (XELODA ) 500 MG tablet Take 4 tablets (2,000 mg total) by mouth 2 (two) times daily after a meal. Take for 14 days, then off for 7 days. 112 tablet 2   dexamethasone  (DECADRON ) 4 MG tablet Take 2 tablets (8 mg total) by mouth daily. Start the day after chemotherapy for 2 days. Take with food. 8 tablet 5   ferrous sulfate  325 (65 FE) MG tablet TAKE 1 TABLET BY MOUTH TWICE A DAY WITH FOOD 180 tablet 1   gabapentin  (NEURONTIN ) 300 MG capsule TAKE 1 CAPSULE BY MOUTH TWICE A DAY 60 capsule 3   hydrALAZINE  (APRESOLINE ) 25 MG tablet TAKE 1 TABLET BY MOUTH EVERY 8 HOURS 270 tablet 0   hydrochlorothiazide  (HYDRODIURIL ) 12.5 MG tablet TAKE 1 TABLET BY MOUTH EVERY DAY 90 tablet 1    hydrocortisone  (ANUSOL -HC) 2.5 % rectal cream Place 1 application rectally 2 (two) times daily. 30 g 0   Ibuprofen  200 MG CAPS Take 400 mg by mouth daily as needed (pain).     isosorbide  mononitrate (IMDUR ) 30 MG 24 hr tablet TAKE 1 TABLET BY MOUTH EVERY DAY 90 tablet 1   levothyroxine  (SYNTHROID ) 25 MCG tablet TAKE 1 TABLET BY MOUTH EVERY DAY 90 tablet 1   lidocaine  (LMX) 4 % cream Apply 1 application topically 3 (three) times daily as needed. 30 g 0   lidocaine -prilocaine  (EMLA ) cream APPLY 1 APPLICATION TOPICALLY AS NEEDED. 30 g 1   lidocaine -prilocaine  (EMLA ) cream Apply to affected area once 30 g 3   lisinopril  (ZESTRIL ) 40 MG tablet TAKE 1 TABLET BY MOUTH EVERY DAY 90 tablet 1   loperamide  (IMODIUM ) 2 MG capsule Take 2 tabs by mouth with first loose stool, then 1 tab with each additional loose stool as needed. Do not exceed 8 tabs in a 24-hour period 60 capsule 3   ondansetron  (ZOFRAN ) 8 MG tablet Take 1 tablet (8 mg total) by mouth every 8 (eight) hours as needed for nausea or vomiting. 20 tablet 2   ondansetron  (ZOFRAN ) 8 MG tablet Take 1 tablet (8 mg total) by mouth every 8 (eight) hours as needed for nausea, vomiting  or refractory nausea / vomiting. Start on the third day after chemotherapy. 30 tablet 1   polycarbophil (FIBERCON) 625 MG tablet Take 1 tablet (625 mg total) by mouth daily. 30 tablet 0   predniSONE  (DELTASONE ) 5 MG tablet 12 day taper: 6,6,5,5,4,4,3,3,2,2,1,1 42 tablet 0   prochlorperazine  (COMPAZINE ) 10 MG tablet Take 1 tablet (10 mg total) by mouth every 6 (six) hours as needed for nausea or vomiting. 30 tablet 2   prochlorperazine  (COMPAZINE ) 10 MG tablet Take 1 tablet (10 mg total) by mouth every 6 (six) hours as needed for nausea or vomiting. 30 tablet 1   No current facility-administered medications for this visit.   Facility-Administered Medications Ordered in Other Visits  Medication Dose Route Frequency Provider Last Rate Last Admin   0.9 %  sodium chloride   infusion   Intravenous Continuous [name], MD 10 mL/hr at 11/10/23 1335 New Bag at 11/10/23 1335   fluorouracil  (ADRUCIL ) 5,000 mg in sodium chloride  0.9 % 150 mL chemo infusion  2,200 mg/m2 (Treatment Plan Recorded) Intravenous 1 day or 1 dose [name], MD       irinotecan  (CAMPTOSAR ) 400 mg in sodium chloride  0.9 % 500 mL chemo infusion  180 mg/m2 (Treatment Plan Recorded) Intravenous Once [name], MD       leucovorin  912 mg in sodium chloride  0.9 % 250 mL infusion  400 mg/m2 (Treatment Plan Recorded) Intravenous Once [name], MD       sodium chloride  flush (NS) 0.9 % injection 10 mL  10 mL Intracatheter PRN [name], MD   10 mL at 06/06/20 1051    PHYSICAL EXAMINATION: ECOG PERFORMANCE STATUS: 1 - Symptomatic but completely ambulatory  Vitals:   11/10/23 1201  BP: (!) 134/58  Pulse: 77  Resp: 16  Temp: 97.9 F (36.6 C)  SpO2: 98%   Wt Readings from Last 3 Encounters:  11/10/23 245 lb 6.4 oz (111.3 kg)  10/27/23 236 lb 14.4 oz (107.5 kg)  10/06/23 239 lb 12.8 oz (108.8 kg)     GENERAL:alert, no distress and comfortable SKIN: skin color, texture, turgor are normal, no rashes or significant lesions EYES: normal, Conjunctiva are pink and non-injected, sclera clear NECK: supple, thyroid  normal size, non-tender, without nodularity LYMPH:  no palpable lymphadenopathy in the cervical, axillary  LUNGS: clear to auscultation and percussion with normal breathing effort HEART: regular rate & rhythm and no murmurs and no lower extremity edema ABDOMEN:abdomen soft, non-tender and normal bowel sounds Musculoskeletal:no cyanosis of digits and no clubbing  NEURO: alert & oriented x 3 with fluent speech, no focal motor/sensory deficits  Physical Exam    LABORATORY DATA:  I have reviewed the data as listed    Latest Ref Rng & Units 11/10/2023   11:38 AM 10/27/2023   11:23 AM 10/06/2023   12:48 PM  CBC  WBC 4.0 - 10.5 K/uL 2.9  6.0  6.9   Hemoglobin 12.0 - 15.0 g/dL 89.2  88.0   88.0   Hematocrit 36.0 - 46.0 % 32.3  35.9  35.5   Platelets 150 - 400 K/uL 129  150  179         Latest Ref Rng & Units 11/10/2023   11:38 AM 10/27/2023   11:23 AM 10/06/2023   12:49 PM  CMP  Glucose 70 - 99 mg/dL 76  75  71   BUN 8 - 23 mg/dL 22  23  25    Creatinine 0.44 - 1.00 mg/dL 9.08  9.04  9.07  Sodium 135 - 145 mmol/L 138  136  135   Potassium 3.5 - 5.1 mmol/L 4.4  4.5  4.3   Chloride 98 - 111 mmol/L 109  106  106   CO2 22 - 32 mmol/L 25  24  25    Calcium  8.9 - 10.3 mg/dL 9.2  89.9  9.7   Total Protein 6.5 - 8.1 g/dL 7.0  8.0  7.6   Total Bilirubin 0.0 - 1.2 mg/dL 0.7  0.9  0.5   Alkaline Phos 38 - 126 U/L 88  94  98   AST 15 - 41 U/L 17  28  22    ALT 0 - 44 U/L 13  18  13        RADIOGRAPHIC STUDIES: I have personally reviewed the radiological images as listed and agreed with the findings in the report. No results found.    Orders Placed This Encounter  Procedures   CBC with Differential (Cancer Center Only)    Standing Status:   Future    Expected Date:   12/23/2023    Expiration Date:   12/22/2024   CMP (Cancer Center only)    Standing Status:   Future    Expected Date:   12/23/2023    Expiration Date:   12/22/2024   CBC with Differential (Cancer Center Only)    Standing Status:   Future    Expected Date:   01/06/2024    Expiration Date:   01/05/2025   CMP (Cancer Center only)    Standing Status:   Future    Expected Date:   01/06/2024    Expiration Date:   01/05/2025   Total Protein, Urine dipstick    Standing Status:   Future    Expected Date:   01/06/2024    Expiration Date:   01/05/2025   All questions were answered. The patient knows to call the clinic with any problems, questions or concerns. No barriers to learning was detected. The total time spent in the appointment was 30 minutes, including review of chart and various tests results, discussions about plan of care and coordination of care plan     Onita Mattock, MD 11/10/2023

## 2023-11-12 ENCOUNTER — Inpatient Hospital Stay

## 2023-11-12 VITALS — BP 143/70 | HR 50 | Temp 97.9°F | Resp 13

## 2023-11-12 DIAGNOSIS — Z5189 Encounter for other specified aftercare: Secondary | ICD-10-CM | POA: Diagnosis not present

## 2023-11-12 DIAGNOSIS — Z5111 Encounter for antineoplastic chemotherapy: Secondary | ICD-10-CM | POA: Diagnosis not present

## 2023-11-12 DIAGNOSIS — Z79899 Other long term (current) drug therapy: Secondary | ICD-10-CM | POA: Diagnosis not present

## 2023-11-12 DIAGNOSIS — C184 Malignant neoplasm of transverse colon: Secondary | ICD-10-CM | POA: Diagnosis not present

## 2023-11-12 DIAGNOSIS — C787 Secondary malignant neoplasm of liver and intrahepatic bile duct: Secondary | ICD-10-CM | POA: Diagnosis not present

## 2023-11-12 DIAGNOSIS — Z5112 Encounter for antineoplastic immunotherapy: Secondary | ICD-10-CM | POA: Diagnosis not present

## 2023-11-12 DIAGNOSIS — C189 Malignant neoplasm of colon, unspecified: Secondary | ICD-10-CM

## 2023-11-12 MED ORDER — PEGFILGRASTIM INJECTION 6 MG/0.6ML ~~LOC~~
6.0000 mg | PREFILLED_SYRINGE | Freq: Once | SUBCUTANEOUS | Status: AC
  Administered 2023-11-12: 6 mg via SUBCUTANEOUS
  Filled 2023-11-12: qty 0.6

## 2023-11-17 ENCOUNTER — Other Ambulatory Visit

## 2023-11-17 ENCOUNTER — Ambulatory Visit

## 2023-11-17 ENCOUNTER — Ambulatory Visit: Admitting: Hematology

## 2023-11-17 DIAGNOSIS — S31109A Unspecified open wound of abdominal wall, unspecified quadrant without penetration into peritoneal cavity, initial encounter: Secondary | ICD-10-CM | POA: Diagnosis not present

## 2023-11-17 DIAGNOSIS — K56609 Unspecified intestinal obstruction, unspecified as to partial versus complete obstruction: Secondary | ICD-10-CM | POA: Diagnosis not present

## 2023-11-17 DIAGNOSIS — T8131XD Disruption of external operation (surgical) wound, not elsewhere classified, subsequent encounter: Secondary | ICD-10-CM | POA: Diagnosis not present

## 2023-11-17 DIAGNOSIS — Z933 Colostomy status: Secondary | ICD-10-CM | POA: Diagnosis not present

## 2023-11-24 ENCOUNTER — Inpatient Hospital Stay

## 2023-11-24 ENCOUNTER — Inpatient Hospital Stay (HOSPITAL_BASED_OUTPATIENT_CLINIC_OR_DEPARTMENT_OTHER): Admitting: Hematology

## 2023-11-24 VITALS — BP 126/52 | HR 60 | Temp 98.1°F | Resp 17 | Wt 241.4 lb

## 2023-11-24 VITALS — Temp 98.0°F

## 2023-11-24 DIAGNOSIS — Z95828 Presence of other vascular implants and grafts: Secondary | ICD-10-CM

## 2023-11-24 DIAGNOSIS — Z5111 Encounter for antineoplastic chemotherapy: Secondary | ICD-10-CM | POA: Diagnosis not present

## 2023-11-24 DIAGNOSIS — C787 Secondary malignant neoplasm of liver and intrahepatic bile duct: Secondary | ICD-10-CM

## 2023-11-24 DIAGNOSIS — C189 Malignant neoplasm of colon, unspecified: Secondary | ICD-10-CM

## 2023-11-24 DIAGNOSIS — Z5112 Encounter for antineoplastic immunotherapy: Secondary | ICD-10-CM | POA: Diagnosis not present

## 2023-11-24 DIAGNOSIS — C184 Malignant neoplasm of transverse colon: Secondary | ICD-10-CM | POA: Diagnosis not present

## 2023-11-24 DIAGNOSIS — Z79899 Other long term (current) drug therapy: Secondary | ICD-10-CM | POA: Diagnosis not present

## 2023-11-24 DIAGNOSIS — Z5189 Encounter for other specified aftercare: Secondary | ICD-10-CM | POA: Diagnosis not present

## 2023-11-24 LAB — CMP (CANCER CENTER ONLY)
ALT: 12 U/L (ref 0–44)
AST: 18 U/L (ref 15–41)
Albumin: 4 g/dL (ref 3.5–5.0)
Alkaline Phosphatase: 145 U/L — ABNORMAL HIGH (ref 38–126)
Anion gap: 5 (ref 5–15)
BUN: 18 mg/dL (ref 8–23)
CO2: 27 mmol/L (ref 22–32)
Calcium: 9.4 mg/dL (ref 8.9–10.3)
Chloride: 106 mmol/L (ref 98–111)
Creatinine: 1.09 mg/dL — ABNORMAL HIGH (ref 0.44–1.00)
GFR, Estimated: 57 mL/min — ABNORMAL LOW (ref >60)
Glucose, Bld: 82 mg/dL (ref 70–99)
Potassium: 4.9 mmol/L (ref 3.5–5.1)
Sodium: 138 mmol/L (ref 135–145)
Total Bilirubin: 0.5 mg/dL (ref 0.0–1.2)
Total Protein: 7.3 g/dL (ref 6.5–8.1)

## 2023-11-24 LAB — CBC WITH DIFFERENTIAL (CANCER CENTER ONLY)
Abs Immature Granulocytes: 0.1 K/uL — ABNORMAL HIGH (ref 0.00–0.07)
Basophils Absolute: 0.1 K/uL (ref 0.0–0.1)
Basophils Relative: 1 %
Eosinophils Absolute: 0.3 K/uL (ref 0.0–0.5)
Eosinophils Relative: 2 %
HCT: 34.8 % — ABNORMAL LOW (ref 36.0–46.0)
Hemoglobin: 11.3 g/dL — ABNORMAL LOW (ref 12.0–15.0)
Immature Granulocytes: 1 %
Lymphocytes Relative: 17 %
Lymphs Abs: 2.5 K/uL (ref 0.7–4.0)
MCH: 32.8 pg (ref 26.0–34.0)
MCHC: 32.5 g/dL (ref 30.0–36.0)
MCV: 100.9 fL — ABNORMAL HIGH (ref 80.0–100.0)
Monocytes Absolute: 0.9 K/uL (ref 0.1–1.0)
Monocytes Relative: 6 %
Neutro Abs: 10.8 K/uL — ABNORMAL HIGH (ref 1.7–7.7)
Neutrophils Relative %: 73 %
Platelet Count: 181 K/uL (ref 150–400)
RBC: 3.45 MIL/uL — ABNORMAL LOW (ref 3.87–5.11)
RDW: 15.7 % — ABNORMAL HIGH (ref 11.5–15.5)
WBC Count: 14.6 K/uL — ABNORMAL HIGH (ref 4.0–10.5)
nRBC: 0 % (ref 0.0–0.2)

## 2023-11-24 LAB — TOTAL PROTEIN, URINE DIPSTICK: Protein, ur: 30 mg/dL — AB

## 2023-11-24 MED ORDER — SODIUM CHLORIDE 0.9 % IV SOLN
400.0000 mg/m2 | Freq: Once | INTRAVENOUS | Status: AC
  Administered 2023-11-24: 912 mg via INTRAVENOUS
  Filled 2023-11-24: qty 45.6

## 2023-11-24 MED ORDER — SODIUM CHLORIDE 0.9 % IV SOLN
180.0000 mg/m2 | Freq: Once | INTRAVENOUS | Status: AC
  Administered 2023-11-24: 400 mg via INTRAVENOUS
  Filled 2023-11-24: qty 15

## 2023-11-24 MED ORDER — SODIUM CHLORIDE 0.9% FLUSH
10.0000 mL | Freq: Once | INTRAVENOUS | Status: AC
  Administered 2023-11-24: 10 mL

## 2023-11-24 MED ORDER — SODIUM CHLORIDE 0.9 % IV SOLN
5.0000 mg/kg | Freq: Once | INTRAVENOUS | Status: AC
  Administered 2023-11-24: 500 mg via INTRAVENOUS
  Filled 2023-11-24: qty 4

## 2023-11-24 MED ORDER — DEXAMETHASONE SODIUM PHOSPHATE 10 MG/ML IJ SOLN
10.0000 mg | Freq: Once | INTRAMUSCULAR | Status: AC
  Administered 2023-11-24: 10 mg via INTRAVENOUS
  Filled 2023-11-24: qty 1

## 2023-11-24 MED ORDER — ATROPINE SULFATE 1 MG/ML IV SOLN
0.5000 mg | Freq: Once | INTRAVENOUS | Status: AC | PRN
  Administered 2023-11-24: 0.5 mg via INTRAVENOUS
  Filled 2023-11-24: qty 1

## 2023-11-24 MED ORDER — SODIUM CHLORIDE 0.9 % IV SOLN
2200.0000 mg/m2 | INTRAVENOUS | Status: DC
  Administered 2023-11-24: 5000 mg via INTRAVENOUS
  Filled 2023-11-24: qty 100

## 2023-11-24 MED ORDER — SODIUM CHLORIDE 0.9% FLUSH: 10.0000 mL | INTRAVENOUS | Status: DC | PRN

## 2023-11-24 MED ORDER — PALONOSETRON HCL INJECTION 0.25 MG/5ML
0.2500 mg | Freq: Once | INTRAVENOUS | Status: AC
  Administered 2023-11-24: 0.25 mg via INTRAVENOUS
  Filled 2023-11-24: qty 5

## 2023-11-24 MED ORDER — SODIUM CHLORIDE 0.9 % IV SOLN: INTRAVENOUS | Status: DC

## 2023-11-24 NOTE — Assessment & Plan Note (Signed)
 [id number] stage IV with liver and nodal metastasis, MSS, KRAS G12S(+)  -Diagnosed in 01/2019 after emergent colectomy and liver biopsy. 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  -Started first line chemo on 03/12/2019, received 5FU/leuc with first 2 cycles for large open abdominal wound after surgery. She started full dose FOLFOX and avastin  with cycle 2. Due to neuropathy Oxaliplatin  was stopped after 09/24/19.  --For convenience she was switched to oral chemo Xeloda  2 weeks on/1 week off and Bev every 3 weeks in 01/21/20. She is tolerating well -Her restaging CT scan from 03/11/2023 showed stable liver metastasis, no other new lesions.  -CT 09/29/2023 showed mild disease progression in liver  -treatment changed to FOLFIRI and beva on 10/27/2023

## 2023-11-24 NOTE — Progress Notes (Signed)
 [facility]   [phone] [phone]   Clinic Follow up Note   Patient Care Team: Casey Black, [name], MD as PCP - General (Internal Medicine) [name], MD as PCP - Cardiology (Cardiology) [name], MD as Consulting Physician (General Surgery) [name], MD as Consulting Physician (Hematology) [name], [name], NP as Nurse Practitioner (Nurse Practitioner)  Date of Service:  11/24/2023  CHIEF COMPLAINT: f/u of metastatic colon cancer  CURRENT THERAPY:  Second line chemotherapy FOLFIRI and bevacizumab   Oncology History   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  -Started first line chemo on 03/12/2019, received 5FU/leuc with first 2 cycles for large open abdominal wound after surgery. She started full dose FOLFOX and avastin  with cycle 2. Due to neuropathy Oxaliplatin  was stopped after 09/24/19.  --For convenience she was switched to oral chemo Xeloda  2 weeks on/1 week off and Bev every 3 weeks in 01/21/20. She is tolerating well -Her restaging CT scan from 03/11/2023 showed stable liver metastasis, no other new lesions.  -CT 09/29/2023 showed mild disease progression in liver  -treatment changed to FOLFIRI and beva on 10/27/2023  Assessment & Plan Colon cancer on chemotherapy Currently undergoing chemotherapy, with cycle three scheduled today. Tolerating chemotherapy well with minimal side effects, including mild diarrhea occurring a week after treatment and manageable bone pain from the supportive shot. Hair loss noted, which is a common side effect of the current chemotherapy regimen. Blood work shows a slightly elevated white count due to the supportive shot, but hemoglobin and platelet counts are stable. Urine protein  is slightly positive but not concerning. No fever or chills reported. Neuropathy persists but does not significantly interfere with daily activities. - Continue current chemotherapy regimen. - Order whole body scan after cycle five to assess response. - Monitor blood counts and adjust supportive shot frequency based on results. - Discontinue capecitabine  and prednisone  as she is no longer needed. - Evaluate kidney and liver function at next visit.  Chemotherapy-induced peripheral neuropathy Experiencing persistent numbness in the extremities, which is a known side effect of chemotherapy. The numbness is more pronounced than tingling and does not significantly interfere with her ability to perform daily activities, such as cooking. Currently taking medication twice daily, but effectiveness is uncertain. - Advise using two hands for important tasks to prevent dropping items.  Hypertension Hypertension is being managed with current medication regimen. - Continue current blood pressure medication regimen.  Plan - Patient is overall doing well, tolerating treatment well. - Labs reviewed, adequate for treatment, will proceed cycle 3 FOLFIRI and bevacizumab  today - Follow-up in 2 weeks before cycle 4 chemo - Plan to repeat a CT scan after cycle 5 of chemo - Due to leukocytosis, will hold G-CSF for this cycle.   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 Bowel-gas pattern consistent with small bowel obstruction. No free air. 2. No acute chest findings.   01/23/2019 Imaging   CT AP IMPRESSION: Obstructing mid transverse colonic  mass with mild  regional adenopathy and hepatic metastatic disease. The mass likely extends through the serosa; no ascites or peritoneal nodularity.   01/24/2019 Surgery   Surgeon: Mitzie DELENA Freund MD Assistant: Harlene Ferraris PA-C Procedure performed: Transverse colectomy with end colostomy, liver biopsy Procedure classification: URGENT/EMERGENT Preop diagnosis: Obstructing, metastatic transverse colon mass Post-op diagnosis/intraop findings: Same   01/24/2019 Pathology Results   FINAL MICROSCOPIC DIAGNOSIS:   A. COLON, TRANSVERSE, RESECTION:  Colonic adenocarcinoma, 5 cm.  Carcinoma extends into pericolonic connective tissue and focally to  serosal surface.  Margins not involved.  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   B. LIVER NODULE, LEFT, BIOPSY:  Metastatic adenocarcinoma.    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 Hypermetabolic metastatic disease in the liver and mediastinal/hilar/axillary lymph nodes. 2. Focal hypermetabolism in the rectum. Continued attention on follow-up exams is warranted. 3. Focal hypermetabolism medial to the right adrenal gland may be within a metastatic lymph node, better visualized on 01/23/2019. 4. Aortic atherosclerosis (ICD10-170.0). Coronary artery calcification.   03/12/2019 -  Chemotherapy   She started 5FU q2weeks on 03/12/19 for 2 cycles. She started full dose FOLFOX with Avastin  on 04/09/19. Oxaliplatin  dose reduced repeatedly due to neuropathy C12 and held since C16 on 10/09/19. Now on maintenance Avastin  and 5FU q2weeks since 10/09/19       -Maintenance change to maintenance xeloda  2000 mg BID days 1-14 q21 days and q3 weeks Zirabev  (15 mg/kg) starting 01/21/20.  First cycle was taken 1000mg  BID due to misunderstanding.    05/31/2019 Imaging   Restaging CT CAP IMPRESSION: 1. Similar to mild interval decrease in size of multiple hepatic lesions, partially calcified. 2. 2 mm right upper lobe pulmonary nodule. Recommend attention on follow-up. 3. Emphysema and aortic atherosclerosis.   08/23/2019 Imaging   CT CAP w contrast  IMPRESSION: 1. The dominant peripheral right liver metastasis has mildly increased. Other smaller liver metastases are stable. 2. Otherwise no new or progressive metastatic disease in the chest, abdomen or pelvis. 3. Aortic Atherosclerosis (ICD10-I70.0) and Emphysema (ICD10-J43.9).   11/27/2019 Imaging   CT CAP w contrast  IMPRESSION: Status post transverse colectomy with right mid abdominal colostomy.   Mildly progressive hepatic metastases, as above.   No evidence of metastatic disease in the chest. Small mediastinal lymph nodes are within normal limits.   Additional stable ancillary findings as above.   01/21/2020 - 10/06/2023 Chemotherapy   Patient is on Treatment Plan : COLORECTAL Bevacizumab  q21d     02/21/2020 Imaging   IMPRESSION: 1. Stable hepatic metastatic disease. 2. Aortic atherosclerosis (ICD10-I70.0). Coronary artery calcification. 3.  Emphysema (ICD10-J43.9).   05/19/2020 Imaging   CT CAP  IMPRESSION: 1. Multiple partially calcified liver metastases are again noted. With the exception of a small lesion in segment 7/8 lesions are not significantly changed in the interval. No new liver lesions identified. 2. Coronary artery atherosclerotic calcifications. 3. Aortic atherosclerosis. 4. 2 mm right upper lobe lung nodule identified.  Unchanged.   Aortic Atherosclerosis (ICD10-I70.0).   11/17/2020 Imaging   CT CAP  IMPRESSION: 1. Partially calcified lesions throughout the liver are stable accounting for differences in technique, contrasted imaging on today's study is compared to noncontrast  imaging  on the prior. 2. No new hepatic lesions. 3. Tiny 3 mm pulmonary nodule in the RIGHT upper lobe unchanged since the prior study. Attention on follow-up. 4. Mild fullness of RIGHT paratracheal nodal tissue is minimally increased and borderline enlarged, attention on follow-up. 5. RIGHT lower quadrant colostomy. 6. 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 7. Aortic atherosclerosis.   06/09/2021 Imaging   EXAM: CT CHEST, ABDOMEN, AND PELVIS WITH CONTRAST  IMPRESSION: Stable calcified liver metastases.   Stable mildly enlarged right paratracheal lymph node.   No evidence of new or progressive metastatic disease.   Aortic Atherosclerosis (ICD10-I70.0).   09/14/2021 PET scan   IMPRESSION: Multiple hypermetabolic and partially calcified liver metastases, without significant change compared to most recent CT.   Mild hypermetabolic mediastinal and bilateral hilar lymphadenopathy, also without significant change.   No evidence of new or progressive metastatic disease.     Electronically Signed   By: Norleen DELENA Kil M.D.   On: 09/15/2021 11:04   04/15/2022 Imaging    IMPRESSION: 1. Several previously noted partially calcified hepatic metastases appear grossly stable compared to the prior examination. No new hepatic lesions or other signs of metastatic disease noted elsewhere in the chest, abdomen or pelvis. 2. Aortic atherosclerosis, in addition to left main and three-vessel coronary artery disease. Please note that although the presence of coronary artery calcium  documents the presence of coronary artery disease, the severity of this disease and any potential stenosis cannot be assessed on this non-gated CT examination. Assessment for potential risk factor modification, dietary therapy or pharmacologic therapy may be warranted, if clinically indicated. 3.  There are calcifications of the mitral annulus. Echocardiographic correlation for evaluation of potential valvular dysfunction may be warranted if clinically indicated. 4. Additional incidental findings, similar to prior studies, as above.   07/15/2022 Imaging    IMPRESSION: 1. Stable exam. No new or progressive findings in the abdomen or pelvis to suggest worsening disease. 2. No substantial change in calcified liver metastases. 3. Right abdominal transverse end colostomy with parastomal herniation of fat and colon proximal to the stoma. 4. Probable left-sided uterine fibroid measuring on the order of 3.1 cm and displacing the endometrial stripe to the right. Pelvic ultrasound could be used to further evaluate as clinically warranted. 5.  Aortic Atherosclerosis (ICD10-I70.0).     07/07/2023 Imaging   CT chest, abdomen, and pelvis IMPRESSION: Partially calcified treated hepatic metastatic disease remains stable, except for mild increase in size of a single small low-attenuation lesion in central left hepatic lobe suspicious for enlarging metastasis. Recommend correlation with tumor markers, and consider short-term follow-up by CT in 3-6 months.   No other sites of metastatic disease identified.   Stable uterine fibroid.     10/27/2023 -  Chemotherapy   Patient is on Treatment Plan : COLORECTAL FOLFIRI + Bevacizumab  q14d        Discussed the use of AI scribe software for clinical note transcription with the patient, who gave verbal consent to proceed.  History of Present Illness Casey Black is a 63 year old female with colon cancer who presents for follow-up.  She is on cycle three of chemotherapy for colon cancer. She experiences mild diarrhea after treatment, resolving within a day, and significant hair loss. Neuropathy presents as numbness but does not interfere with her work. She takes medication for neuropathy but questions its effectiveness. A supportive shot last  Saturday caused bone pain, relieved by Zyrtec. Her white blood cell count is slightly elevated. She is not experiencing nausea and does not require antiemetic medication. She is no longer on capecitabine  or prednisone  due to cardiac effects. She continues blood pressure medication and occasionally uses dexamethasone  post-chemotherapy for nausea or low energy, though these symptoms are rare. 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     All other systems were reviewed with the patient and are negative.  MEDICAL HISTORY:  Past Medical History:  Diagnosis Date   Anemia    low iron   Colon cancer (HCC) 01/2019   Hypertension 01/23/2019   Personal history of chemotherapy 01/2019   colon CA   SBO (small bowel obstruction) (HCC) 01/23/2019    SURGICAL HISTORY: Past Surgical History:  Procedure Laterality Date   CESAREAN SECTION     x2   COLOSTOMY N/A 01/24/2019   Procedure: End Loop Colostomy;  Surgeon: [name], MD;  Location: [facility] OR;  Service: General;  Laterality: N/A;   PARTIAL COLECTOMY N/A 01/24/2019   Procedure: PARTIAL COLECTOMY;  Surgeon: [name], MD;  Location: [facility] OR;  Service: General;  Laterality: N/A;   PORTACATH PLACEMENT Right 02/28/2019   Procedure: INSERTION PORT-A-CATH WITH ULTRASOUND GUIDANCE;  Surgeon: [name], MD;  Location: [facility] OR;  Service: General;  Laterality: Right;    I have reviewed the social history and family history with the patient and they are unchanged from previous note.  ALLERGIES:  has no known allergies.  MEDICATIONS:  Current Outpatient Medications  Medication Sig Dispense Refill   acetaminophen  (TYLENOL ) 325 MG tablet Take 2 tablets (650 mg total) by mouth every 6 (six) hours as needed.     amLODipine  (NORVASC ) 10 MG tablet TAKE 1 TABLET BY MOUTH EVERY DAY 90 tablet 1   benzonatate  (TESSALON ) 100 MG capsule Take 1 capsule (100 mg total) by mouth 2 (two) times daily as needed for cough. 20  capsule 0   dexamethasone  (DECADRON ) 4 MG tablet Take 2 tablets (8 mg total) by mouth daily. Start the day after chemotherapy for 2 days. Take with food. 8 tablet 5   ferrous sulfate  325 (65 FE) MG tablet TAKE 1 TABLET BY MOUTH TWICE A DAY WITH FOOD 180 tablet 1   gabapentin  (NEURONTIN ) 300 MG capsule TAKE 1 CAPSULE BY MOUTH TWICE A DAY 60 capsule 3   hydrALAZINE  (APRESOLINE ) 25 MG tablet TAKE 1 TABLET BY MOUTH EVERY 8 HOURS 270 tablet 0   hydrochlorothiazide  (HYDRODIURIL ) 12.5 MG tablet TAKE 1 TABLET BY MOUTH EVERY DAY 90 tablet 1   hydrocortisone  (ANUSOL -HC) 2.5 % rectal cream Place 1 application rectally 2 (two) times daily. 30 g 0   Ibuprofen  200 MG CAPS Take 400 mg by mouth daily as needed (pain).     isosorbide  mononitrate (IMDUR ) 30 MG 24 hr tablet TAKE 1 TABLET BY MOUTH EVERY DAY 90 tablet 1   levothyroxine  (SYNTHROID ) 25 MCG tablet TAKE 1 TABLET BY MOUTH EVERY DAY 90 tablet 1   lidocaine  (LMX) 4 % cream Apply 1 application topically 3 (three) times daily as needed. 30 g 0   lidocaine -prilocaine  (EMLA ) cream APPLY 1 APPLICATION TOPICALLY AS NEEDED. 30 g 1   lidocaine -prilocaine  (EMLA ) cream Apply to affected area once 30 g 3   lisinopril  (ZESTRIL ) 40 MG tablet TAKE 1 TABLET BY MOUTH EVERY DAY 90 tablet 1   loperamide  (IMODIUM ) 2 MG capsule Take 2 tabs by mouth with  first loose stool, then 1 tab with each additional loose stool as needed. Do not exceed 8 tabs in a 24-hour period 60 capsule 3   ondansetron  (ZOFRAN ) 8 MG tablet Take 1 tablet (8 mg total) by mouth every 8 (eight) hours as needed for nausea or vomiting. 20 tablet 2   ondansetron  (ZOFRAN ) 8 MG tablet Take 1 tablet (8 mg total) by mouth every 8 (eight) hours as needed for nausea, vomiting or refractory nausea / vomiting. Start on the third day after chemotherapy. 30 tablet 1   polycarbophil (FIBERCON) 625 MG tablet Take 1 tablet (625 mg total) by mouth daily. 30 tablet 0   prochlorperazine  (COMPAZINE ) 10 MG tablet Take 1 tablet  (10 mg total) by mouth every 6 (six) hours as needed for nausea or vomiting. 30 tablet 2   prochlorperazine  (COMPAZINE ) 10 MG tablet Take 1 tablet (10 mg total) by mouth every 6 (six) hours as needed for nausea or vomiting. 30 tablet 1   No current facility-administered medications for this visit.   Facility-Administered Medications Ordered in Other Visits  Medication Dose Route Frequency Provider Last Rate Last Admin   0.9 %  sodium chloride  infusion   Intravenous Continuous [name], MD   Stopped at 11/24/23 1545   fluorouracil  (ADRUCIL ) 5,000 mg in sodium chloride  0.9 % 150 mL chemo infusion  2,200 mg/m2 (Treatment Plan Recorded) Intravenous 1 day or 1 dose [name], MD   Infusion Verify at 11/24/23 1551   sodium chloride  flush (NS) 0.9 % injection 10 mL  10 mL Intracatheter PRN [name], MD   10 mL at 06/06/20 1051   sodium chloride  flush (NS) 0.9 % injection 10 mL  10 mL Intracatheter PRN [name], MD        PHYSICAL EXAMINATION: ECOG PERFORMANCE STATUS: 1 - Symptomatic but completely ambulatory  Vitals:   11/24/23 1150  BP: (!) 126/52  Pulse: 60  Resp: 17  Temp: 98.1 F (36.7 C)   Wt Readings from Last 3 Encounters:  11/24/23 241 lb 6.4 oz (109.5 kg)  11/10/23 245 lb 6.4 oz (111.3 kg)  10/27/23 236 lb 14.4 oz (107.5 kg)     GENERAL:alert, no distress and comfortable SKIN: skin color, texture, turgor are normal, no rashes or significant lesions EYES: normal, Conjunctiva are pink and non-injected, sclera clear NECK: supple, thyroid  normal size, non-tender, without nodularity LYMPH:  no palpable lymphadenopathy in the cervical, axillary  LUNGS: clear to auscultation and percussion with normal breathing effort HEART: regular rate & rhythm and no murmurs and no lower extremity edema ABDOMEN:abdomen soft, non-tender and normal bowel sounds Musculoskeletal:no cyanosis of digits and no clubbing  NEURO: alert & oriented x 3 with fluent speech, no focal motor/sensory  deficits  Physical Exam    LABORATORY DATA:  I have reviewed the data as listed    Latest Ref Rng & Units 11/24/2023   11:19 AM 11/10/2023   11:38 AM 10/27/2023   11:23 AM  CBC  WBC 4.0 - 10.5 K/uL 14.6  2.9  6.0   Hemoglobin 12.0 - 15.0 g/dL 88.6  89.2  88.0   Hematocrit 36.0 - 46.0 % 34.8  32.3  35.9   Platelets 150 - 400 K/uL 181  129  150         Latest Ref Rng & Units 11/24/2023   11:19 AM 11/10/2023   11:38 AM 10/27/2023   11:23 AM  CMP  Glucose 70 - 99 mg/dL 82  76  75  BUN 8 - 23 mg/dL 18  22  23    Creatinine 0.44 - 1.00 mg/dL 8.90  9.08  9.04   Sodium 135 - 145 mmol/L 138  138  136   Potassium 3.5 - 5.1 mmol/L 4.9  4.4  4.5   Chloride 98 - 111 mmol/L 106  109  106   CO2 22 - 32 mmol/L 27  25  24    Calcium  8.9 - 10.3 mg/dL 9.4  9.2  89.9   Total Protein 6.5 - 8.1 g/dL 7.3  7.0  8.0   Total Bilirubin 0.0 - 1.2 mg/dL 0.5  0.7  0.9   Alkaline Phos 38 - 126 U/L 145  88  94   AST 15 - 41 U/L 18  17  28    ALT 0 - 44 U/L 12  13  18        RADIOGRAPHIC STUDIES: I have personally reviewed the radiological images as listed and agreed with the findings in the report. No results found.    No orders of the defined types were placed in this encounter.  All questions were answered. The patient knows to call the clinic with any problems, questions or concerns. No barriers to learning was detected. The total time spent in the appointment was 25 minutes, including review of chart and various tests results, discussions about plan of care and coordination of care plan     Onita Mattock, MD 11/24/2023

## 2023-11-24 NOTE — Patient Instructions (Signed)
 [facility] - A DEPT OF [facility]  Discharge Instructions: Thank you for choosing [facility] to provide your oncology and hematology care.   If you have a lab appointment with the Cancer Center, please go directly to the Cancer Center and check in at the registration area.   Wear comfortable clothing and clothing appropriate for easy access to any Portacath or PICC line.   We strive to give you quality time with your provider. You may need to reschedule your appointment if you arrive late (15 or more minutes).  Arriving late affects you and other patients whose appointments are after yours.  Also, if you miss three or more appointments without notifying the office, you may be dismissed from the clinic at the provider's discretion.      For prescription refill requests, have your pharmacy contact our office and allow 72 hours for refills to be completed.    Today you received the following chemotherapy and/or immunotherapy agents: Bevacizumab , irinotecan , fluorouracil ,    To help prevent nausea and vomiting after your treatment, we encourage you to take your nausea medication as directed.  BELOW ARE SYMPTOMS THAT SHOULD BE REPORTED IMMEDIATELY: *FEVER GREATER THAN 100.4 F (38 C) OR HIGHER *CHILLS OR SWEATING *NAUSEA AND VOMITING THAT IS NOT CONTROLLED WITH YOUR NAUSEA MEDICATION *UNUSUAL SHORTNESS OF BREATH *UNUSUAL BRUISING OR BLEEDING *URINARY PROBLEMS (pain or burning when urinating, or frequent urination) *BOWEL PROBLEMS (unusual diarrhea, constipation, pain near the anus) TENDERNESS IN MOUTH AND THROAT WITH OR WITHOUT PRESENCE OF ULCERS (sore throat, sores in mouth, or a toothache) UNUSUAL RASH, SWELLING OR PAIN  UNUSUAL VAGINAL DISCHARGE OR ITCHING   Items with * indicate a potential emergency and should be followed up as soon as possible or go to the Emergency Department if any problems should occur.  Please show the CHEMOTHERAPY  ALERT CARD or IMMUNOTHERAPY ALERT CARD at check-in to the Emergency Department and triage nurse.  Should you have questions after your visit or need to cancel or reschedule your appointment, please contact [facility] - A DEPT OF [facility]  Dept: [phone]  and follow the prompts.  Office hours are 8:00 a.m. to 4:30 p.m. Monday - Friday. Please note that voicemails left after 4:00 p.m. may not be returned until the following business day.  We are closed weekends and major holidays. You have access to a nurse at all times for urgent questions. Please call the main number to the clinic Dept: [phone] and follow the prompts.   For any non-urgent questions, you may also contact your provider using MyChart. We now offer e-Visits for anyone 34 and older to request care online for non-urgent symptoms. For details visit [email or web address].   Also download the MyChart app! Go to the app store, search MyChart, open the app, select [facility], and log in with your MyChart username and password.

## 2023-11-26 ENCOUNTER — Inpatient Hospital Stay

## 2023-11-27 DIAGNOSIS — C787 Secondary malignant neoplasm of liver and intrahepatic bile duct: Secondary | ICD-10-CM | POA: Diagnosis not present

## 2023-11-27 DIAGNOSIS — C189 Malignant neoplasm of colon, unspecified: Secondary | ICD-10-CM | POA: Diagnosis not present

## 2023-12-08 ENCOUNTER — Telehealth: Payer: Self-pay

## 2023-12-08 ENCOUNTER — Inpatient Hospital Stay (HOSPITAL_BASED_OUTPATIENT_CLINIC_OR_DEPARTMENT_OTHER): Admitting: Nurse Practitioner

## 2023-12-08 ENCOUNTER — Encounter: Payer: Self-pay | Admitting: Nurse Practitioner

## 2023-12-08 ENCOUNTER — Inpatient Hospital Stay: Attending: Hematology

## 2023-12-08 ENCOUNTER — Inpatient Hospital Stay

## 2023-12-08 VITALS — BP 138/72 | HR 70 | Temp 97.7°F | Resp 16 | Ht 68.0 in | Wt 241.4 lb

## 2023-12-08 DIAGNOSIS — Z5189 Encounter for other specified aftercare: Secondary | ICD-10-CM | POA: Insufficient documentation

## 2023-12-08 DIAGNOSIS — Z79899 Other long term (current) drug therapy: Secondary | ICD-10-CM | POA: Diagnosis not present

## 2023-12-08 DIAGNOSIS — C184 Malignant neoplasm of transverse colon: Secondary | ICD-10-CM | POA: Insufficient documentation

## 2023-12-08 DIAGNOSIS — Z5111 Encounter for antineoplastic chemotherapy: Secondary | ICD-10-CM | POA: Insufficient documentation

## 2023-12-08 DIAGNOSIS — C189 Malignant neoplasm of colon, unspecified: Secondary | ICD-10-CM | POA: Diagnosis not present

## 2023-12-08 DIAGNOSIS — C787 Secondary malignant neoplasm of liver and intrahepatic bile duct: Secondary | ICD-10-CM | POA: Insufficient documentation

## 2023-12-08 DIAGNOSIS — C772 Secondary and unspecified malignant neoplasm of intra-abdominal lymph nodes: Secondary | ICD-10-CM | POA: Insufficient documentation

## 2023-12-08 DIAGNOSIS — Z5112 Encounter for antineoplastic immunotherapy: Secondary | ICD-10-CM | POA: Insufficient documentation

## 2023-12-08 LAB — CMP (CANCER CENTER ONLY)
ALT: 12 U/L (ref 0–44)
AST: 18 U/L (ref 15–41)
Albumin: 3.8 g/dL (ref 3.5–5.0)
Alkaline Phosphatase: 104 U/L (ref 38–126)
Anion gap: 5 (ref 5–15)
BUN: 24 mg/dL — ABNORMAL HIGH (ref 8–23)
CO2: 27 mmol/L (ref 22–32)
Calcium: 9.6 mg/dL (ref 8.9–10.3)
Chloride: 107 mmol/L (ref 98–111)
Creatinine: 0.98 mg/dL (ref 0.44–1.00)
GFR, Estimated: >60 mL/min (ref >60)
Glucose, Bld: 89 mg/dL (ref 70–99)
Potassium: 4.2 mmol/L (ref 3.5–5.1)
Sodium: 139 mmol/L (ref 135–145)
Total Bilirubin: 0.4 mg/dL (ref 0.0–1.2)
Total Protein: 7.3 g/dL (ref 6.5–8.1)

## 2023-12-08 LAB — CBC WITH DIFFERENTIAL (CANCER CENTER ONLY)
Abs Immature Granulocytes: 0 K/uL (ref 0.00–0.07)
Basophils Absolute: 0.1 K/uL (ref 0.0–0.1)
Basophils Relative: 1 %
Eosinophils Absolute: 0.6 K/uL — ABNORMAL HIGH (ref 0.0–0.5)
Eosinophils Relative: 13 %
HCT: 33.1 % — ABNORMAL LOW (ref 36.0–46.0)
Hemoglobin: 11 g/dL — ABNORMAL LOW (ref 12.0–15.0)
Immature Granulocytes: 0 %
Lymphocytes Relative: 27 %
Lymphs Abs: 1.4 K/uL (ref 0.7–4.0)
MCH: 32.5 pg (ref 26.0–34.0)
MCHC: 33.2 g/dL (ref 30.0–36.0)
MCV: 97.9 fL (ref 80.0–100.0)
Monocytes Absolute: 0.4 K/uL (ref 0.1–1.0)
Monocytes Relative: 9 %
Neutro Abs: 2.6 K/uL (ref 1.7–7.7)
Neutrophils Relative %: 50 %
Platelet Count: 200 K/uL (ref 150–400)
RBC: 3.38 MIL/uL — ABNORMAL LOW (ref 3.87–5.11)
RDW: 14.9 % (ref 11.5–15.5)
WBC Count: 5.1 K/uL (ref 4.0–10.5)
nRBC: 0 % (ref 0.0–0.2)

## 2023-12-08 LAB — CEA (ACCESS): CEA (CHCC): 298.37 ng/mL — ABNORMAL HIGH (ref 0.00–5.00)

## 2023-12-08 LAB — TOTAL PROTEIN, URINE DIPSTICK: Protein, ur: NEGATIVE mg/dL

## 2023-12-08 MED ORDER — SODIUM CHLORIDE 0.9 % IV SOLN
180.0000 mg/m2 | Freq: Once | INTRAVENOUS | Status: AC
  Administered 2023-12-08: 400 mg via INTRAVENOUS
  Filled 2023-12-08: qty 15

## 2023-12-08 MED ORDER — ATROPINE SULFATE 1 MG/ML IV SOLN
0.5000 mg | Freq: Once | INTRAVENOUS | Status: AC | PRN
Start: 2023-12-08 — End: 2023-12-08
  Administered 2023-12-08: 0.5 mg via INTRAVENOUS
  Filled 2023-12-08: qty 1

## 2023-12-08 MED ORDER — PALONOSETRON HCL INJECTION 0.25 MG/5ML
0.2500 mg | Freq: Once | INTRAVENOUS | Status: AC
  Administered 2023-12-08: 0.25 mg via INTRAVENOUS
  Filled 2023-12-08: qty 5

## 2023-12-08 MED ORDER — SODIUM CHLORIDE 0.9 % IV SOLN
400.0000 mg/m2 | Freq: Once | INTRAVENOUS | Status: AC
  Administered 2023-12-08: 912 mg via INTRAVENOUS
  Filled 2023-12-08: qty 45.6

## 2023-12-08 MED ORDER — DEXAMETHASONE SODIUM PHOSPHATE 10 MG/ML IJ SOLN
10.0000 mg | Freq: Once | INTRAMUSCULAR | Status: AC
  Administered 2023-12-08: 10 mg via INTRAVENOUS
  Filled 2023-12-08: qty 1

## 2023-12-08 MED ORDER — SODIUM CHLORIDE 0.9 % IV SOLN
2200.0000 mg/m2 | INTRAVENOUS | Status: DC
  Administered 2023-12-08: 5000 mg via INTRAVENOUS
  Filled 2023-12-08: qty 100

## 2023-12-08 MED ORDER — SODIUM CHLORIDE 0.9 % IV SOLN: INTRAVENOUS | Status: DC

## 2023-12-08 MED ORDER — SODIUM CHLORIDE 0.9 % IV SOLN
5.0000 mg/kg | Freq: Once | INTRAVENOUS | Status: AC
  Administered 2023-12-08: 500 mg via INTRAVENOUS
  Filled 2023-12-08: qty 4

## 2023-12-08 NOTE — Telephone Encounter (Signed)
 Pt called stating that Powell Lessen, NP had called the pt and LVM stating to return call.  Pt stated she's returning Heather's call.  Stated that Powell is currently w/another pt but this nurse will make her aware that the pt returned Heather's call.  Stated Powell will give the pt a call before the close of business today.  Pt verbalized understanding.

## 2023-12-08 NOTE — Patient Instructions (Signed)
 [facility] - A DEPT OF [facility]  Discharge Instructions: Thank you for choosing [facility] to provide your oncology and hematology care.   If you have a lab appointment with the Cancer Center, please go directly to the Cancer Center and check in at the registration area.   Wear comfortable clothing and clothing appropriate for easy access to any Portacath or PICC line.   We strive to give you quality time with your provider. You may need to reschedule your appointment if you arrive late (15 or more minutes).  Arriving late affects you and other patients whose appointments are after yours.  Also, if you miss three or more appointments without notifying the office, you may be dismissed from the clinic at the provider's discretion.      For prescription refill requests, have your pharmacy contact our office and allow 72 hours for refills to be completed.    Today you received the following chemotherapy and/or immunotherapy agents: Bevacizumab, Irinotecan, Leucovorin, 5FU      To help prevent nausea and vomiting after your treatment, we encourage you to take your nausea medication as directed.  BELOW ARE SYMPTOMS THAT SHOULD BE REPORTED IMMEDIATELY: *FEVER GREATER THAN 100.4 F (38 C) OR HIGHER *CHILLS OR SWEATING *NAUSEA AND VOMITING THAT IS NOT CONTROLLED WITH YOUR NAUSEA MEDICATION *UNUSUAL SHORTNESS OF BREATH *UNUSUAL BRUISING OR BLEEDING *URINARY PROBLEMS (pain or burning when urinating, or frequent urination) *BOWEL PROBLEMS (unusual diarrhea, constipation, pain near the anus) TENDERNESS IN MOUTH AND THROAT WITH OR WITHOUT PRESENCE OF ULCERS (sore throat, sores in mouth, or a toothache) UNUSUAL RASH, SWELLING OR PAIN  UNUSUAL VAGINAL DISCHARGE OR ITCHING   Items with * indicate a potential emergency and should be followed up as soon as possible or go to the Emergency Department if any problems should occur.  Please show the  CHEMOTHERAPY ALERT CARD or IMMUNOTHERAPY ALERT CARD at check-in to the Emergency Department and triage nurse.  Should you have questions after your visit or need to cancel or reschedule your appointment, please contact [facility] - A DEPT OF [facility]  Dept: [phone]  and follow the prompts.  Office hours are 8:00 a.m. to 4:30 p.m. Monday - Friday. Please note that voicemails left after 4:00 p.m. may not be returned until the following business day.  We are closed weekends and major holidays. You have access to a nurse at all times for urgent questions. Please call the main number to the clinic Dept: [phone] and follow the prompts.   For any non-urgent questions, you may also contact your provider using MyChart. We now offer e-Visits for anyone 64 and older to request care online for non-urgent symptoms. For details visit [email or web address].   Also download the MyChart app! Go to the app store, search "MyChart", open the app, select [facility], and log in with your MyChart username and password.

## 2023-12-08 NOTE — Progress Notes (Signed)
 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  -Started first line chemo on 03/12/2019, received 5FU/leuc with first 2 cycles for large open abdominal wound after surgery. She started full dose FOLFOX and avastin  with cycle 2. Due to neuropathy Oxaliplatin  was stopped after 09/24/19.  --For convenience she was switched to oral chemo Xeloda  2 weeks on/1 week off and Bev every 3 weeks in 01/21/20. She is tolerating well -Her restaging CT scan from 03/11/2023 showed stable liver metastasis, no other new lesions.  -CT 09/29/2023 showed mild disease progression in liver  -treatment changed to FOLFIRI and beva on 10/27/2023 -- She tolerates current treatment very well.  Has diarrhea week following chemotherapy.  Headache for 1 to 2 days after chemo.  Has baseline neuropathy without worsening. -- 12/08/2023 -proceed with cycle 4 day 1 chemotherapy FOLFIRI and bevacizumab .   Diarrhea The patient reports getting diarrhea the week after chemotherapy.  Will have several loose stools each day.  She is able to take Imodium  which is effective.  Encouraged increased oral fluid intake, especially during periods of diarrhea.  May also consider electrolyte supplementation like Gatorade or liquid IV to help replenish during those times.  Headache She reports headache for 1 to 2 days after  chemotherapy.  She is able to take Tylenol  with good relief.  On the third day after chemotherapy, she no longer has a headache.  Peripheral neuropathy Patient does have residual peripheral neuropathy in both hands and both feet.  It does not interfere with her normal activities.  She is able to work every day.  She takes gabapentin  300 mg twice daily which helps manage symptoms.  She denies worsening of neuropathy at this time.  Plan Labs reviewed. -Mild and stable anemia.  Improved leukopenia. -Unremarkable CMP. Labs send patient presentation are appropriate for treatment today. Proceed with adjuvant chemotherapy FOLFIRI and bevacizumab  today. Labs/flush, follow-up, and cycle 5 as currently scheduled.   The patient understands the plans discussed today and is in agreement with them.  She knows to contact our office if she develops concerns prior to her next appointment.  I provided 25 minutes of face-to-face time during this encounter and > 50% was spent counseling as documented under my assessment and plan.    Powell FORBES Lessen, NP  [facility] - A DEPT OF [facility]. [facility] [address] [place] [address] Dept: [phone] Dept Fax: [phone]   No orders of the defined types were placed in this encounter.     CHIEF COMPLAINT:  CC: adenocarcinoma of colon metastatic to liver  Current Treatment:  FOLFIRI and bevacizumab   INTERVAL HISTORY:  [name] is here today for repeat clinical assessment. Was last seen by Dr. Lanny on 11/24/2023. Tolerating treatment well thus far. Required G-CSF support after cycle on 11/10/2023 due to leukopenia. Today, she presents for Cycle 4 day 1 chemotherapy with FOLFIRI and  bevacizumab .  Patient reports doing well in general.  She reports baseline peripheral neuropathy.  It does not interfere with her normal activities.  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  States she  gets mild headache for 1 to 2 days following chemotherapy.  She takes Tylenol  for this and it effective.  She reports diarrhea a week after chemotherapy.  She will have 2-3 loose stools each day for 2 to 3 days and improves.  She will take Imodium  for this to improve symptoms.  She denies abdominal pain.  She denies chest pain, chest pressure, shortness of breath.  She denies fevers or chills. She denies pain. Her appetite is good. Her weight has been stable.  I have reviewed the past medical history, past surgical history, social history and family history with the patient and they are unchanged from previous note.  ALLERGIES:  has no known allergies.  MEDICATIONS:  Current Outpatient Medications  Medication Sig Dispense Refill   acetaminophen  (TYLENOL ) 325 MG tablet Take 2 tablets (650 mg total) by mouth every 6 (six) hours as needed.     amLODipine  (NORVASC ) 10 MG tablet TAKE 1 TABLET BY MOUTH EVERY DAY 90 tablet 1   benzonatate  (TESSALON ) 100 MG capsule Take 1 capsule (100 mg total) by mouth 2 (two) times daily as needed for cough. 20 capsule 0   dexamethasone  (DECADRON ) 4 MG tablet Take 2 tablets (8 mg total) by mouth daily. Start the day after chemotherapy for 2 days. Take with food. 8 tablet 5   ferrous sulfate  325 (65 FE) MG tablet TAKE 1 TABLET BY MOUTH TWICE A DAY WITH FOOD 180 tablet 1   gabapentin  (NEURONTIN ) 300 MG capsule TAKE 1 CAPSULE BY MOUTH TWICE A DAY 60 capsule 3   hydrALAZINE  (APRESOLINE ) 25 MG tablet TAKE 1 TABLET BY MOUTH EVERY 8 HOURS 270 tablet 0   hydrochlorothiazide  (HYDRODIURIL ) 12.5 MG tablet TAKE 1 TABLET BY MOUTH EVERY DAY 90 tablet 1   hydrocortisone  (ANUSOL -HC) 2.5 % rectal cream Place 1 application rectally 2 (two) times daily. 30 g 0   Ibuprofen  200 MG CAPS Take 400 mg by mouth daily as needed (pain).     isosorbide  mononitrate (IMDUR ) 30 MG 24 hr tablet TAKE 1 TABLET BY MOUTH EVERY DAY 90 tablet 1   levothyroxine  (SYNTHROID ) 25 MCG tablet TAKE 1 TABLET BY  MOUTH EVERY DAY 90 tablet 1   lidocaine  (LMX) 4 % cream Apply 1 application topically 3 (three) times daily as needed. 30 g 0   lidocaine -prilocaine  (EMLA ) cream APPLY 1 APPLICATION TOPICALLY AS NEEDED. 30 g 1   lidocaine -prilocaine  (EMLA ) cream Apply to affected area once 30 g 3   lisinopril  (ZESTRIL ) 40 MG tablet TAKE 1 TABLET BY MOUTH EVERY DAY 90 tablet 1   loperamide  (IMODIUM ) 2 MG capsule Take 2 tabs by mouth with first loose stool, then 1 tab with each additional loose stool as needed. Do not exceed 8 tabs in a 24-hour period 60 capsule 3   ondansetron  (ZOFRAN ) 8 MG tablet Take 1 tablet (8 mg total) by mouth every 8 (eight) hours as needed for nausea or vomiting. 20 tablet 2   ondansetron  (ZOFRAN ) 8 MG tablet Take 1 tablet (8 mg total) by mouth every 8 (eight) hours as needed for nausea, vomiting or refractory nausea / vomiting. Start on the third day after chemotherapy. 30 tablet 1   polycarbophil (FIBERCON) 625 MG tablet Take 1 tablet (625 mg total) by mouth daily. 30 tablet  0   prochlorperazine  (COMPAZINE ) 10 MG tablet Take 1 tablet (10 mg total) by mouth every 6 (six) hours as needed for nausea or vomiting. 30 tablet 2   prochlorperazine  (COMPAZINE ) 10 MG tablet Take 1 tablet (10 mg total) by mouth every 6 (six) hours as needed for nausea or vomiting. 30 tablet 1   No current facility-administered medications for this visit.   Facility-Administered Medications Ordered in Other Visits  Medication Dose Route Frequency Provider Last Rate Last Admin   0.9 %  sodium chloride  infusion   Intravenous Continuous [facility] Callander, MD   Stopped at 12/08/23 1200   fluorouracil  (ADRUCIL ) 5,000 mg in sodium chloride  0.9 % 150 mL chemo infusion  2,200 mg/m2 (Treatment Plan Recorded) Intravenous 1 day or 1 dose [facility] Callander, MD   Infusion Verify at 12/08/23 1206   sodium chloride  flush (NS) 0.9 % injection 10 mL  10 mL Intracatheter PRN [facility] Callander, MD   10 mL at 06/06/20 1051    HISTORY OF PRESENT ILLNESS:    Oncology History Overview Note  Cancer Staging 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 Bowel-gas pattern consistent with small bowel obstruction. No free air. 2. No acute chest findings.   01/23/2019 Imaging   CT AP IMPRESSION: Obstructing mid transverse colonic mass with mild regional adenopathy and hepatic metastatic disease. The mass likely extends through the serosa; no ascites or peritoneal nodularity.   01/24/2019 Surgery   Surgeon: Mitzie DELENA Freund MD Assistant: Harlene Ferraris PA-C Procedure performed: Transverse colectomy with end colostomy, liver biopsy Procedure classification: URGENT/EMERGENT Preop diagnosis: Obstructing, metastatic transverse colon mass Post-op diagnosis/intraop findings: Same   01/24/2019 Pathology Results   FINAL MICROSCOPIC DIAGNOSIS:   A. COLON, TRANSVERSE, RESECTION:  Colonic adenocarcinoma, 5 cm.  Carcinoma extends into pericolonic connective tissue and focally to  serosal surface.  Margins not involved.  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   B. LIVER NODULE, LEFT, BIOPSY:  Metastatic adenocarcinoma.    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 Hypermetabolic metastatic disease in the liver and mediastinal/hilar/axillary lymph nodes. 2. Focal hypermetabolism in the rectum. Continued attention on follow-up exams is warranted. 3. Focal hypermetabolism medial to the right  adrenal gland may be within a metastatic lymph node, better visualized on 01/23/2019. 4. Aortic atherosclerosis (ICD10-170.0). Coronary artery calcification.   03/12/2019 -  Chemotherapy   She started 5FU q2weeks on 03/12/19 for 2 cycles. She started full dose FOLFOX with Avastin  on 04/09/19. Oxaliplatin  dose reduced repeatedly due to neuropathy C12 and held since C16 on 10/09/19. Now on maintenance Avastin  and 5FU q2weeks since 10/09/19       -Maintenance change to maintenance xeloda  2000 mg BID days 1-14 q21 days and q3 weeks Zirabev  (15 mg/kg) starting 01/21/20. First cycle was taken 1000mg  BID due to misunderstanding.    05/31/2019 Imaging   Restaging CT CAP IMPRESSION: 1. Similar to mild interval decrease in size of multiple hepatic lesions, partially  calcified. 2. 2 mm right upper lobe pulmonary nodule. Recommend attention on follow-up. 3. Emphysema and aortic atherosclerosis.   08/23/2019 Imaging   CT CAP w contrast  IMPRESSION: 1. The dominant peripheral right liver metastasis has mildly increased. Other smaller liver metastases are stable. 2. Otherwise no new or progressive metastatic disease in the chest, abdomen or pelvis. 3. Aortic Atherosclerosis (ICD10-I70.0) and Emphysema (ICD10-J43.9).   11/27/2019 Imaging   CT CAP w contrast  IMPRESSION: Status post transverse colectomy with right mid abdominal colostomy.   Mildly progressive hepatic metastases, as above.   No evidence of metastatic disease in the chest. Small mediastinal lymph nodes are within normal limits.   Additional stable ancillary findings as above.   01/21/2020 - 10/06/2023 Chemotherapy   Patient is on Treatment Plan : COLORECTAL Bevacizumab  q21d     02/21/2020 Imaging   IMPRESSION: 1. Stable hepatic metastatic disease. 2. Aortic atherosclerosis (ICD10-I70.0). Coronary artery calcification. 3.  Emphysema (ICD10-J43.9).   05/19/2020 Imaging   CT CAP  IMPRESSION: 1. Multiple partially calcified liver  metastases are again noted. With the exception of a small lesion in segment 7/8 lesions are not significantly changed in the interval. No new liver lesions identified. 2. Coronary artery atherosclerotic calcifications. 3. Aortic atherosclerosis. 4. 2 mm right upper lobe lung nodule identified.  Unchanged.   Aortic Atherosclerosis (ICD10-I70.0).   11/17/2020 Imaging   CT CAP  IMPRESSION: 1. Partially calcified lesions throughout the liver are stable accounting for differences in technique, contrasted imaging on today's study is compared to noncontrast imaging on the prior. 2. No new hepatic lesions. 3. Tiny 3 mm pulmonary nodule in the RIGHT upper lobe unchanged since the prior study. Attention on follow-up. 4. Mild fullness of RIGHT paratracheal nodal tissue is minimally increased and borderline enlarged, attention on follow-up. 5. RIGHT lower quadrant colostomy. 6. 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 7. Aortic atherosclerosis.   06/09/2021 Imaging   EXAM: CT CHEST, ABDOMEN, AND PELVIS WITH CONTRAST  IMPRESSION: Stable calcified liver metastases.   Stable mildly enlarged right paratracheal lymph node.   No evidence of new or progressive metastatic disease.   Aortic Atherosclerosis (ICD10-I70.0).   09/14/2021 PET scan   IMPRESSION: Multiple hypermetabolic and partially calcified liver metastases, without significant change compared to most recent CT.   Mild hypermetabolic mediastinal and bilateral hilar lymphadenopathy, also without significant change.   No evidence of new or progressive metastatic disease.     Electronically Signed   By: Norleen DELENA Kil M.D.   On: 09/15/2021 11:04   04/15/2022 Imaging    IMPRESSION: 1. Several previously noted partially calcified hepatic metastases appear grossly stable compared to the prior examination. No  new hepatic lesions or other signs of metastatic disease noted elsewhere in the chest, abdomen or pelvis. 2. Aortic atherosclerosis, in addition to left main and three-vessel coronary artery disease. Please note that although the presence of coronary artery calcium  documents the presence of coronary artery disease, the severity of this disease and any potential stenosis cannot be assessed on this non-gated CT examination. Assessment for potential risk factor modification, dietary therapy or pharmacologic therapy may be warranted, if clinically indicated. 3. There are calcifications of the mitral annulus. Echocardiographic correlation for evaluation of potential valvular dysfunction may be warranted if clinically indicated. 4. Additional incidental findings, similar to prior studies, as above.   07/15/2022 Imaging    IMPRESSION: 1. Stable  exam. No new or progressive findings in the abdomen or pelvis to suggest worsening disease. 2. No substantial change in calcified liver metastases. 3. Right abdominal transverse end colostomy with parastomal herniation of fat and colon proximal to the stoma. 4. Probable left-sided uterine fibroid measuring on the order of 3.1 cm and displacing the endometrial stripe to the right. Pelvic ultrasound could be used to further evaluate as clinically warranted. 5.  Aortic Atherosclerosis (ICD10-I70.0).     07/07/2023 Imaging   CT chest, abdomen, and pelvis IMPRESSION: Partially calcified treated hepatic metastatic disease remains stable, except for mild increase in size of a single small low-attenuation lesion in central left hepatic lobe suspicious for enlarging metastasis. Recommend correlation with tumor markers, and consider short-term follow-up by CT in 3-6 months.   No other sites of metastatic disease identified.   Stable uterine fibroid.     10/27/2023 -  Chemotherapy   Patient is on Treatment Plan : COLORECTAL FOLFIRI + Bevacizumab  q14d          REVIEW OF SYSTEMS:   Constitutional: Denies fevers, chills or abnormal weight loss Eyes: Denies blurriness of vision Ears, nose, mouth, throat, and face: Denies mucositis or sore throat Respiratory: Denies cough, dyspnea or wheezes Cardiovascular: Denies palpitation, chest discomfort or lower extremity swelling Gastrointestinal:  Denies nausea, heartburn or change in bowel habits Skin: Denies abnormal skin rashes Lymphatics: Denies new lymphadenopathy or easy bruising Neurological:Denies numbness, tingling or new weaknesses Behavioral/Psych: Mood is stable, no new changes  All other systems were reviewed with the patient and are negative.   VITALS:   Today's Vitals   12/08/23 0826 12/08/23 0900  BP: 138/72   Pulse: 70   Resp: 16   Temp: 97.7 F (36.5 C)   TempSrc: Temporal   SpO2: 99%   Weight: 241 lb 6.4 oz (109.5 kg)   Height: 5' 8 (1.727 m)   PainSc:  0-No pain   Body mass index is 36.7 kg/m.   Wt Readings from Last 3 Encounters:  12/08/23 241 lb 6.4 oz (109.5 kg)  11/24/23 241 lb 6.4 oz (109.5 kg)  11/10/23 245 lb 6.4 oz (111.3 kg)    Body mass index is 36.7 kg/m.  Performance status (ECOG): 1 - Symptomatic but completely ambulatory  PHYSICAL EXAM:   GENERAL:alert, no distress and comfortable SKIN: skin color, texture, turgor are normal, no rashes or significant lesions EYES: normal, Conjunctiva are pink and non-injected, sclera clear OROPHARYNX:no exudate, no erythema and lips, buccal mucosa, and tongue normal  NECK: supple, thyroid  normal size, non-tender, without nodularity LYMPH:  no palpable lymphadenopathy in the cervical, axillary or inguinal LUNGS: clear to auscultation and percussion with normal breathing effort HEART: regular rate & rhythm and no murmurs and no lower extremity edema ABDOMEN:abdomen soft, non-tender and normal bowel sounds. Intact colostomy.  Musculoskeletal:no cyanosis of digits and no clubbing  NEURO: alert & oriented x 3  with fluent speech, no focal motor/sensory deficits  LABORATORY DATA:  I have reviewed the data as listed    Component Value Date/Time   NA 139 12/08/2023 0757   K 4.2 12/08/2023 0757   CL 107 12/08/2023 0757   CO2 27 12/08/2023 0757   GLUCOSE 89 12/08/2023 0757   BUN 24 (H) 12/08/2023 0757   CREATININE 0.98 12/08/2023 0757   CALCIUM  9.6 12/08/2023 0757   PROT 7.3 12/08/2023 0757   ALBUMIN 3.8 12/08/2023 0757   AST 18 12/08/2023 0757   ALT 12 12/08/2023 0757   ALKPHOS 104 12/08/2023  0757   BILITOT 0.4 12/08/2023 0757   GFRNONAA >60 12/08/2023 0757   GFRAA >60 01/07/2020 0957    Lab Results  Component Value Date   WBC 5.1 12/08/2023   NEUTROABS 2.6 12/08/2023   HGB 11.0 (L) 12/08/2023   HCT 33.1 (L) 12/08/2023   MCV 97.9 12/08/2023   PLT 200 12/08/2023

## 2023-12-08 NOTE — Assessment & Plan Note (Addendum)
 [id number] stage IV with liver and nodal metastasis, MSS, KRAS G12S(+)  -Diagnosed in 01/2019 after emergent colectomy and liver biopsy. 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  -Started first line chemo on 03/12/2019, received 5FU/leuc with first 2 cycles for large open abdominal wound after surgery. She started full dose FOLFOX and avastin  with cycle 2. Due to neuropathy Oxaliplatin  was stopped after 09/24/19.  --For convenience she was switched to oral chemo Xeloda  2 weeks on/1 week off and Bev every 3 weeks in 01/21/20. She is tolerating well -Her restaging CT scan from 03/11/2023 showed stable liver metastasis, no other new lesions.  -CT 09/29/2023 showed mild disease progression in liver  -treatment changed to FOLFIRI and beva on 10/27/2023 -- She tolerates current treatment very well.  Has diarrhea week following chemotherapy.  Headache for 1 to 2 days after chemo.  Has baseline neuropathy without worsening. -- 12/08/2023 -proceed with cycle 4 day 1 chemotherapy FOLFIRI and bevacizumab .

## 2023-12-09 ENCOUNTER — Encounter: Payer: Self-pay | Admitting: Hematology

## 2023-12-09 NOTE — Progress Notes (Signed)
Orders placed in this encounter.

## 2023-12-10 ENCOUNTER — Inpatient Hospital Stay

## 2023-12-10 VITALS — BP 138/66 | HR 58 | Temp 97.7°F | Resp 16

## 2023-12-10 DIAGNOSIS — C189 Malignant neoplasm of colon, unspecified: Secondary | ICD-10-CM

## 2023-12-10 DIAGNOSIS — Z5111 Encounter for antineoplastic chemotherapy: Secondary | ICD-10-CM | POA: Diagnosis not present

## 2023-12-10 DIAGNOSIS — Z5112 Encounter for antineoplastic immunotherapy: Secondary | ICD-10-CM | POA: Diagnosis not present

## 2023-12-10 MED ORDER — PEGFILGRASTIM INJECTION 6 MG/0.6ML ~~LOC~~
6.0000 mg | PREFILLED_SYRINGE | Freq: Once | SUBCUTANEOUS | Status: AC
  Administered 2023-12-10: 6 mg via SUBCUTANEOUS
  Filled 2023-12-10: qty 0.6

## 2023-12-16 DIAGNOSIS — S31109A Unspecified open wound of abdominal wall, unspecified quadrant without penetration into peritoneal cavity, initial encounter: Secondary | ICD-10-CM | POA: Diagnosis not present

## 2023-12-16 DIAGNOSIS — Z933 Colostomy status: Secondary | ICD-10-CM | POA: Diagnosis not present

## 2023-12-16 DIAGNOSIS — K56609 Unspecified intestinal obstruction, unspecified as to partial versus complete obstruction: Secondary | ICD-10-CM | POA: Diagnosis not present

## 2023-12-16 DIAGNOSIS — T8131XD Disruption of external operation (surgical) wound, not elsewhere classified, subsequent encounter: Secondary | ICD-10-CM | POA: Diagnosis not present

## 2023-12-21 NOTE — Progress Notes (Signed)
 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  -Started first line chemo on 03/12/2019, received 5FU/leuc with first 2 cycles for large open abdominal wound after surgery. She started full dose FOLFOX and avastin  with cycle 2. Due to neuropathy Oxaliplatin  was stopped after 09/24/19.  --For convenience she was switched to oral chemo Xeloda  2 weeks on/1 week off and Bev every 3 weeks in 01/21/20. She is tolerating well -Her restaging CT scan from 03/11/2023 showed stable liver metastasis, no other new lesions.  -CT 09/29/2023 showed mild disease progression in liver  -treatment changed to FOLFIRI and beva on 10/27/2023 -- She tolerates current treatment very well.  Has diarrhea week following chemotherapy.  Headache for 1 to 2 days after chemo.  Has baseline neuropathy without worsening. -- 12/08/2023 -proceed with cycle 4 day 1 chemotherapy FOLFIRI and bevacizumab . - 12/23/2023 -will proceed with cycle 5 day 1 chemotherapy FOLFIRI and bevacizumab .  EKG done today showed sinus bradycardia and septal defect of undetermined age.  Unchanged presentation when compared to multiple previous EKGs.  Plan to continue with chemotherapy FOLFIRI and bevacizumab  as scheduled.   Episode of chest pain  Patient states she had episode of chest pain earlier this week.  Woke her up out of sleep  making it difficult for her to get back to sleep.  She feels like symptoms may have been related to acid reflux, though she is unsure.  An EKG was performed during today's visit.  It shows sinus bradycardia as well as a septal infarct, age undetermined.  When reviewing previous EKGs, this is unchanged.  Patient reassured.  Discussed management of GERD symptoms.  Recommend OTC Pepcid , especially prior to going to bed.  Encouraged her to sleep with head of bed raised at 30.  Advised her not to eat 2 hours prior to going to bed which will help prevent onset of GERD symptoms.  She voiced understanding and agreement.  Anemia Mild and stable anemia with Hgb 11.6 and HCT 36.3.  No requirement for blood transfusion or IV iron.  Will monitor blood count closely, prior to each treatment with chemotherapy.  Treat deficiency as indicated.  Leukocytosis WBC 11.5 and ANC 8.3.  No evidence of infection.  She denies fevers, chills, night sweats, or unintentional weight loss.  There are no swollen lymph nodes.  Appetite is at baseline.  Will continue to monitor closely.  Plan Labs reviewed. - Mild anemia and leukocytosis. - Unremarkable CMP. Abnormal EKG today however, unchanged from prior EKGs done per cardiology. Labs and patient presentation are appropriate for treatment today. Proceed with cycle 5 day 1 chemotherapy FOLFIRI with bevacizumab . Labs/labs, follow-up, and cycle 6 chemotherapy as scheduled.  The patient understands the plans discussed today and is in agreement with them.  She knows to contact our office if she develops concerns prior to her next appointment.  I provided 35 minutes of face-to-face time during this encounter and > 50% was spent  counseling as documented under my assessment and plan.    Casey FORBES Lessen, NP  [facility] - A DEPT OF [facility] [address] [place] [address] Dept: [phone] Dept Fax:  [phone]   No orders of the defined types were placed in this encounter.     CHIEF COMPLAINT:  CC: Adenocarcinoma of the colon  Current Treatment: Adjuvant chemotherapy FOLFIRI and bevacizumab   INTERVAL HISTORY:  [facility] is here today for repeat clinical assessment.  She was last seen by me on 12/08/2023.  Today, she presents for cycle 5 day 1 chemotherapy FOLFIRI and bevacizumab .  She reports episode of chest pain earlier this week.  Woke up to sleep.  Had a hard time going back to sleep due to chest discomfort.  Finally resolved on its own and has not come back.  Has seen cardiology in the past and has EKGs on file.  She states she is otherwise doing well.  She denies chest pressure, or shortness of breath. She denies headaches or visual disturbances. She denies abdominal pain, nausea, vomiting, or changes in bowel or bladder habits.  She denies fevers or chills. Her appetite is fair. Her weight has been stable.  I have reviewed the past medical history, past surgical history, social history and family history with the patient and they are unchanged from previous note.  ALLERGIES:  has no known allergies.  MEDICATIONS:  Current Outpatient Medications  Medication Sig Dispense Refill   acetaminophen  (TYLENOL ) 325 MG tablet Take 2 tablets (650 mg total) by mouth every 6 (six) hours as needed.     amLODipine  (NORVASC ) 10 MG tablet TAKE 1 TABLET BY MOUTH EVERY DAY 90 tablet 1   benzonatate  (TESSALON ) 100 MG capsule Take 1 capsule (100 mg total) by mouth 2 (two) times daily as needed for cough. 20 capsule 0   dexamethasone  (DECADRON ) 4 MG tablet Take 2 tablets (8 mg total) by mouth daily. Start the day after chemotherapy for 2 days. Take with food. 8 tablet 5   ferrous sulfate  325 (65 FE) MG tablet TAKE 1 TABLET BY MOUTH TWICE A DAY WITH FOOD 180 tablet 1   gabapentin  (NEURONTIN ) 300 MG capsule TAKE 1 CAPSULE BY MOUTH TWICE A DAY 60 capsule 3   hydrALAZINE  (APRESOLINE ) 25 MG tablet TAKE 1  TABLET BY MOUTH EVERY 8 HOURS 270 tablet 0   hydrochlorothiazide  (HYDRODIURIL ) 12.5 MG tablet TAKE 1 TABLET BY MOUTH EVERY DAY 90 tablet 1   hydrocortisone  (ANUSOL -HC) 2.5 % rectal cream Place 1 application rectally 2 (two) times daily. 30 g 0   Ibuprofen  200 MG CAPS Take 400 mg by mouth daily as needed (pain).     isosorbide  mononitrate (IMDUR ) 30 MG 24 hr tablet TAKE 1 TABLET BY MOUTH EVERY DAY 90 tablet 1   levothyroxine  (SYNTHROID ) 25 MCG tablet TAKE 1 TABLET BY MOUTH EVERY DAY 90 tablet 1   lidocaine  (LMX) 4 % cream Apply 1 application topically 3 (three) times daily as needed. 30 g 0   lidocaine -prilocaine  (EMLA ) cream APPLY 1 APPLICATION TOPICALLY AS NEEDED. 30 g 1   lidocaine -prilocaine  (EMLA ) cream Apply to affected area once 30 g 3   lisinopril  (ZESTRIL ) 40 MG tablet TAKE 1 TABLET BY MOUTH EVERY DAY 90 tablet 1   loperamide  (IMODIUM ) 2 MG capsule Take 2 tabs by mouth with first loose stool, then 1 tab with each additional loose stool as needed. Do not exceed 8  tabs in a 24-hour period 60 capsule 3   ondansetron  (ZOFRAN ) 8 MG tablet Take 1 tablet (8 mg total) by mouth every 8 (eight) hours as needed for nausea or vomiting. 20 tablet 2   ondansetron  (ZOFRAN ) 8 MG tablet Take 1 tablet (8 mg total) by mouth every 8 (eight) hours as needed for nausea, vomiting or refractory nausea / vomiting. Start on the third day after chemotherapy. 30 tablet 1   polycarbophil (FIBERCON) 625 MG tablet Take 1 tablet (625 mg total) by mouth daily. 30 tablet 0   prochlorperazine  (COMPAZINE ) 10 MG tablet Take 1 tablet (10 mg total) by mouth every 6 (six) hours as needed for nausea or vomiting. 30 tablet 2   prochlorperazine  (COMPAZINE ) 10 MG tablet Take 1 tablet (10 mg total) by mouth every 6 (six) hours as needed for nausea or vomiting. 30 tablet 1   No current facility-administered medications for this visit.   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 Bowel-gas pattern consistent with small bowel obstruction. No free air. 2. No acute chest findings.   01/23/2019 Imaging   CT AP IMPRESSION: Obstructing mid transverse colonic mass with mild regional adenopathy and hepatic metastatic disease. The mass likely extends through the serosa; no ascites or peritoneal nodularity.   01/24/2019 Surgery   Surgeon: Mitzie DELENA Freund MD Assistant: Harlene Ferraris PA-C Procedure performed: Transverse colectomy with end colostomy, liver biopsy Procedure classification: URGENT/EMERGENT Preop diagnosis: Obstructing, metastatic transverse colon mass Post-op diagnosis/intraop findings: Same   01/24/2019 Pathology Results   FINAL MICROSCOPIC DIAGNOSIS:   A. COLON, TRANSVERSE, RESECTION:  Colonic adenocarcinoma, 5 cm.  Carcinoma extends into pericolonic connective tissue and focally to  serosal surface.  Margins not involved.  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   B. LIVER NODULE, LEFT, BIOPSY:  Metastatic adenocarcinoma.    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 Hypermetabolic metastatic disease in the liver  and mediastinal/hilar/axillary lymph nodes. 2. Focal hypermetabolism in the rectum. Continued attention on follow-up exams is warranted. 3. Focal hypermetabolism medial to the right adrenal gland may be within a metastatic lymph node, better visualized on 01/23/2019. 4. Aortic atherosclerosis (ICD10-170.0). Coronary artery calcification.   03/12/2019 -  Chemotherapy   She started 5FU q2weeks on 03/12/19 for 2 cycles. She started full dose FOLFOX with Avastin  on 04/09/19. Oxaliplatin  dose reduced repeatedly due to neuropathy C12 and held since C16 on 10/09/19. Now on maintenance Avastin  and 5FU q2weeks since 10/09/19       -Maintenance change to maintenance xeloda  2000 mg BID days 1-14 q21 days and q3 weeks Zirabev  (15 mg/kg)  starting 01/21/20. First cycle was taken 1000mg  BID due to misunderstanding.    05/31/2019 Imaging   Restaging CT CAP IMPRESSION: 1. Similar to mild interval decrease in size of multiple hepatic lesions, partially calcified. 2. 2 mm right upper lobe pulmonary nodule. Recommend attention on follow-up. 3. Emphysema and aortic atherosclerosis.   08/23/2019 Imaging   CT CAP w contrast  IMPRESSION: 1. The dominant peripheral right liver metastasis has mildly increased. Other smaller liver metastases are stable. 2. Otherwise no new or progressive metastatic disease in the chest, abdomen or pelvis. 3. Aortic Atherosclerosis (ICD10-I70.0) and Emphysema (ICD10-J43.9).   11/27/2019 Imaging   CT CAP w contrast  IMPRESSION: Status post transverse colectomy with right mid abdominal colostomy.   Mildly progressive hepatic metastases, as above.   No evidence of metastatic disease in the chest. Small mediastinal lymph nodes are within normal limits.   Additional stable ancillary findings as above.   01/21/2020 - 10/06/2023 Chemotherapy   Patient is on Treatment Plan : COLORECTAL Bevacizumab  q21d     02/21/2020 Imaging   IMPRESSION: 1. Stable hepatic metastatic disease. 2. Aortic  atherosclerosis (ICD10-I70.0). Coronary artery calcification. 3.  Emphysema (ICD10-J43.9).   05/19/2020 Imaging   CT CAP  IMPRESSION: 1. Multiple partially calcified liver metastases are again noted. With the exception of a small lesion in segment 7/8 lesions are not significantly changed in the interval. No new liver lesions identified. 2. Coronary artery atherosclerotic calcifications. 3. Aortic atherosclerosis. 4. 2 mm right upper lobe lung nodule identified.  Unchanged.   Aortic Atherosclerosis (ICD10-I70.0).   11/17/2020 Imaging   CT CAP  IMPRESSION: 1. Partially calcified lesions throughout the liver are stable accounting for differences in technique, contrasted imaging on today's study is compared to noncontrast imaging on the prior. 2. No new hepatic lesions. 3. Tiny 3 mm pulmonary nodule in the RIGHT upper lobe unchanged since the prior study. Attention on follow-up. 4. Mild fullness of RIGHT paratracheal nodal tissue is minimally increased and borderline enlarged, attention on follow-up. 5. RIGHT lower quadrant colostomy. 6. 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 7. Aortic atherosclerosis.   06/09/2021 Imaging   EXAM: CT CHEST, ABDOMEN, AND PELVIS WITH CONTRAST  IMPRESSION: Stable calcified liver metastases.   Stable mildly enlarged right paratracheal lymph node.   No evidence of new or progressive metastatic disease.   Aortic Atherosclerosis (ICD10-I70.0).   09/14/2021 PET scan   IMPRESSION: Multiple hypermetabolic and partially calcified liver metastases, without significant change compared to most recent CT.   Mild hypermetabolic mediastinal and bilateral hilar lymphadenopathy, also without significant change.   No evidence of new or progressive metastatic disease.     Electronically Signed   By: Norleen DELENA Kil M.D.   On: 09/15/2021  11:04   04/15/2022 Imaging    IMPRESSION: 1. Several previously noted partially calcified hepatic metastases appear grossly stable compared to the prior examination. No new hepatic lesions or other signs of metastatic disease noted elsewhere in the chest, abdomen or pelvis. 2. Aortic atherosclerosis, in addition to left main and three-vessel coronary artery disease. Please note that although the presence of coronary artery calcium  documents the presence of coronary artery disease, the severity of this disease and any potential stenosis cannot be assessed on this non-gated CT examination. Assessment for potential risk factor modification, dietary therapy or pharmacologic therapy may be warranted, if clinically indicated. 3. There are calcifications of the mitral  annulus. Echocardiographic correlation for evaluation of potential valvular dysfunction may be warranted if clinically indicated. 4. Additional incidental findings, similar to prior studies, as above.   07/15/2022 Imaging    IMPRESSION: 1. Stable exam. No new or progressive findings in the abdomen or pelvis to suggest worsening disease. 2. No substantial change in calcified liver metastases. 3. Right abdominal transverse end colostomy with parastomal herniation of fat and colon proximal to the stoma. 4. Probable left-sided uterine fibroid measuring on the order of 3.1 cm and displacing the endometrial stripe to the right. Pelvic ultrasound could be used to further evaluate as clinically warranted. 5.  Aortic Atherosclerosis (ICD10-I70.0).     07/07/2023 Imaging   CT chest, abdomen, and pelvis IMPRESSION: Partially calcified treated hepatic metastatic disease remains stable, except for mild increase in size of a single small low-attenuation lesion in central left hepatic lobe suspicious for enlarging metastasis. Recommend correlation with tumor markers, and consider short-term follow-up by CT in 3-6 months.   No other  sites of metastatic disease identified.   Stable uterine fibroid.     10/27/2023 -  Chemotherapy   Patient is on Treatment Plan : COLORECTAL FOLFIRI + Bevacizumab  q14d         REVIEW OF SYSTEMS:   Constitutional: Denies fevers, chills or abnormal weight loss Eyes: Denies blurriness of vision Ears, nose, mouth, throat, and face: Denies mucositis or sore throat Respiratory: Denies cough, dyspnea or wheezes Cardiovascular: Denies palpitation, chest discomfort or lower extremity swelling. Had single episode of chest pain earlier in the week. Resolved without intervention and has not come back.  Gastrointestinal:  Denies nausea, heartburn or change in bowel habits Skin: Denies abnormal skin rashes Lymphatics: Denies new lymphadenopathy or easy bruising Neurological:Denies numbness, tingling or new weaknesses Behavioral/Psych: Mood is stable, no new changes  All other systems were reviewed with the patient and are negative.   VITALS:   Today's Vitals   12/22/23 1017  BP: 138/70  Pulse: 76  Resp: 16  Temp: (!) 97.5 F (36.4 C)  TempSrc: Temporal  SpO2: 96%  Weight: 241 lb 11.2 oz (109.6 kg)  Height: 5' 8 (1.727 m)   Body mass index is 36.75 kg/m.    Wt Readings from Last 3 Encounters:  12/22/23 241 lb 11.2 oz (109.6 kg)  12/08/23 241 lb 6.4 oz (109.5 kg)  11/24/23 241 lb 6.4 oz (109.5 kg)    Body mass index is 36.75 kg/m.  Performance status (ECOG): 1 - Symptomatic but completely ambulatory  PHYSICAL EXAM:   GENERAL:alert, no distress and comfortable SKIN: skin color, texture, turgor are normal, no rashes or significant lesions EYES: normal, Conjunctiva are pink and non-injected, sclera clear OROPHARYNX:no exudate, no erythema and lips, buccal mucosa, and tongue normal  NECK: supple, thyroid  normal size, non-tender, without nodularity LYMPH:  no palpable lymphadenopathy in the cervical, axillary or inguinal LUNGS: clear to auscultation and percussion with normal  breathing effort HEART: regular rate & rhythm and no murmurs and no lower extremity edema.  EKG showing sinus bradycardia and septal defect of undetermined age.  This is unchanged from multiple previous EKGs. ABDOMEN:abdomen soft, non-tender and normal bowel sounds Musculoskeletal:no cyanosis of digits and no clubbing  NEURO: alert & oriented x 3 with fluent speech, no focal motor/sensory deficits  LABORATORY DATA:  I have reviewed the data as listed    Component Value Date/Time   NA 140 12/22/2023 0941   K 4.4 12/22/2023 0941   CL 108 12/22/2023 0941   CO2  28 12/22/2023 0941   GLUCOSE 102 (H) 12/22/2023 0941   BUN 16 12/22/2023 0941   CREATININE 0.98 12/22/2023 0941   CALCIUM  9.6 12/22/2023 0941   PROT 7.3 12/22/2023 0941   ALBUMIN 3.8 12/22/2023 0941   AST 18 12/22/2023 0941   ALT 12 12/22/2023 0941   ALKPHOS 156 (H) 12/22/2023 0941   BILITOT 0.3 12/22/2023 0941   GFRNONAA >60 12/22/2023 0941   GFRAA >60 01/07/2020 0957    Lab Results  Component Value Date   WBC 11.5 (H) 12/22/2023   NEUTROABS 8.3 (H) 12/22/2023   HGB 11.6 (L) 12/22/2023   HCT 36.3 12/22/2023   MCV 98.1 12/22/2023   PLT 168 12/22/2023

## 2023-12-21 NOTE — Assessment & Plan Note (Addendum)
 [id number] stage IV with liver and nodal metastasis, MSS, KRAS G12S(+)  -Diagnosed in 01/2019 after emergent colectomy and liver biopsy. 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  -Started first line chemo on 03/12/2019, received 5FU/leuc with first 2 cycles for large open abdominal wound after surgery. She started full dose FOLFOX and avastin  with cycle 2. Due to neuropathy Oxaliplatin  was stopped after 09/24/19.  --For convenience she was switched to oral chemo Xeloda  2 weeks on/1 week off and Bev every 3 weeks in 01/21/20. She is tolerating well -Her restaging CT scan from 03/11/2023 showed stable liver metastasis, no other new lesions.  -CT 09/29/2023 showed mild disease progression in liver  -treatment changed to FOLFIRI and beva on 10/27/2023 -- She tolerates current treatment very well.  Has diarrhea week following chemotherapy.  Headache for 1 to 2 days after chemo.  Has baseline neuropathy without worsening. -- 12/08/2023 -proceed with cycle 4 day 1 chemotherapy FOLFIRI and bevacizumab . - 12/23/2023 -will proceed with cycle 5 day 1 chemotherapy FOLFIRI and bevacizumab .  EKG done today showed sinus bradycardia and septal defect of undetermined age.  Unchanged presentation when compared to multiple previous EKGs.  Plan to continue with chemotherapy FOLFIRI and bevacizumab  as scheduled.

## 2023-12-22 ENCOUNTER — Inpatient Hospital Stay

## 2023-12-22 ENCOUNTER — Inpatient Hospital Stay (HOSPITAL_BASED_OUTPATIENT_CLINIC_OR_DEPARTMENT_OTHER): Admitting: Nurse Practitioner

## 2023-12-22 ENCOUNTER — Other Ambulatory Visit: Payer: Self-pay

## 2023-12-22 VITALS — BP 138/70 | HR 76 | Temp 97.5°F | Resp 16 | Ht 68.0 in | Wt 241.7 lb

## 2023-12-22 DIAGNOSIS — C189 Malignant neoplasm of colon, unspecified: Secondary | ICD-10-CM

## 2023-12-22 DIAGNOSIS — C787 Secondary malignant neoplasm of liver and intrahepatic bile duct: Secondary | ICD-10-CM | POA: Diagnosis not present

## 2023-12-22 DIAGNOSIS — Z5112 Encounter for antineoplastic immunotherapy: Secondary | ICD-10-CM | POA: Diagnosis not present

## 2023-12-22 LAB — CBC WITH DIFFERENTIAL (CANCER CENTER ONLY)
Abs Immature Granulocytes: 0.06 K/uL (ref 0.00–0.07)
Basophils Absolute: 0.1 K/uL (ref 0.0–0.1)
Basophils Relative: 1 %
Eosinophils Absolute: 0.4 K/uL (ref 0.0–0.5)
Eosinophils Relative: 4 %
HCT: 36.3 % (ref 36.0–46.0)
Hemoglobin: 11.6 g/dL — ABNORMAL LOW (ref 12.0–15.0)
Immature Granulocytes: 1 %
Lymphocytes Relative: 18 %
Lymphs Abs: 2.1 K/uL (ref 0.7–4.0)
MCH: 31.4 pg (ref 26.0–34.0)
MCHC: 32 g/dL (ref 30.0–36.0)
MCV: 98.1 fL (ref 80.0–100.0)
Monocytes Absolute: 0.7 K/uL (ref 0.1–1.0)
Monocytes Relative: 6 %
Neutro Abs: 8.3 K/uL — ABNORMAL HIGH (ref 1.7–7.7)
Neutrophils Relative %: 70 %
Platelet Count: 168 K/uL (ref 150–400)
RBC: 3.7 MIL/uL — ABNORMAL LOW (ref 3.87–5.11)
RDW: 15.2 % (ref 11.5–15.5)
WBC Count: 11.5 K/uL — ABNORMAL HIGH (ref 4.0–10.5)
nRBC: 0 % (ref 0.0–0.2)

## 2023-12-22 LAB — CMP (CANCER CENTER ONLY)
ALT: 12 U/L (ref 0–44)
AST: 18 U/L (ref 15–41)
Albumin: 3.8 g/dL (ref 3.5–5.0)
Alkaline Phosphatase: 156 U/L — ABNORMAL HIGH (ref 38–126)
Anion gap: 4 — ABNORMAL LOW (ref 5–15)
BUN: 16 mg/dL (ref 8–23)
CO2: 28 mmol/L (ref 22–32)
Calcium: 9.6 mg/dL (ref 8.9–10.3)
Chloride: 108 mmol/L (ref 98–111)
Creatinine: 0.98 mg/dL (ref 0.44–1.00)
GFR, Estimated: >60 mL/min (ref >60)
Glucose, Bld: 102 mg/dL — ABNORMAL HIGH (ref 70–99)
Potassium: 4.4 mmol/L (ref 3.5–5.1)
Sodium: 140 mmol/L (ref 135–145)
Total Bilirubin: 0.3 mg/dL (ref 0.0–1.2)
Total Protein: 7.3 g/dL (ref 6.5–8.1)

## 2023-12-22 LAB — TOTAL PROTEIN, URINE DIPSTICK: Protein, ur: NEGATIVE mg/dL

## 2023-12-22 MED ORDER — SODIUM CHLORIDE 0.9 % IV SOLN
2200.0000 mg/m2 | INTRAVENOUS | Status: DC
  Administered 2023-12-22: 5000 mg via INTRAVENOUS
  Filled 2023-12-22: qty 100

## 2023-12-22 MED ORDER — SODIUM CHLORIDE 0.9 % IV SOLN
180.0000 mg/m2 | Freq: Once | INTRAVENOUS | Status: AC
  Administered 2023-12-22: 400 mg via INTRAVENOUS
  Filled 2023-12-22: qty 15

## 2023-12-22 MED ORDER — SODIUM CHLORIDE 0.9 % IV SOLN
5.0000 mg/kg | Freq: Once | INTRAVENOUS | Status: AC
  Administered 2023-12-22: 500 mg via INTRAVENOUS
  Filled 2023-12-22: qty 4

## 2023-12-22 MED ORDER — SODIUM CHLORIDE 0.9 % IV SOLN: INTRAVENOUS | Status: DC

## 2023-12-22 MED ORDER — PALONOSETRON HCL INJECTION 0.25 MG/5ML
0.2500 mg | Freq: Once | INTRAVENOUS | Status: AC
  Administered 2023-12-22: 0.25 mg via INTRAVENOUS
  Filled 2023-12-22: qty 5

## 2023-12-22 MED ORDER — DEXAMETHASONE SODIUM PHOSPHATE 10 MG/ML IJ SOLN
10.0000 mg | Freq: Once | INTRAMUSCULAR | Status: AC
  Administered 2023-12-22: 10 mg via INTRAVENOUS
  Filled 2023-12-22: qty 1

## 2023-12-22 MED ORDER — ATROPINE SULFATE 1 MG/ML IV SOLN: 0.5000 mg | Freq: Once | INTRAVENOUS | Status: DC | PRN

## 2023-12-22 MED ORDER — SODIUM CHLORIDE 0.9 % IV SOLN
400.0000 mg/m2 | Freq: Once | INTRAVENOUS | Status: AC
  Administered 2023-12-22: 912 mg via INTRAVENOUS
  Filled 2023-12-22: qty 45.6

## 2023-12-22 NOTE — Patient Instructions (Signed)
 [facility] - A DEPT OF [facility]  Discharge Instructions: Thank you for choosing [facility] to provide your oncology and hematology care.   If you have a lab appointment with the Cancer Center, please go directly to the Cancer Center and check in at the registration area.   Wear comfortable clothing and clothing appropriate for easy access to any Portacath or PICC line.   We strive to give you quality time with your provider. You may need to reschedule your appointment if you arrive late (15 or more minutes).  Arriving late affects you and other patients whose appointments are after yours.  Also, if you miss three or more appointments without notifying the office, you may be dismissed from the clinic at the provider's discretion.      For prescription refill requests, have your pharmacy contact our office and allow 72 hours for refills to be completed.    Today you received the following chemotherapy and/or immunotherapy agents: Bevacizumab, Irinotecan, Leucovorin, 5FU      To help prevent nausea and vomiting after your treatment, we encourage you to take your nausea medication as directed.  BELOW ARE SYMPTOMS THAT SHOULD BE REPORTED IMMEDIATELY: *FEVER GREATER THAN 100.4 F (38 C) OR HIGHER *CHILLS OR SWEATING *NAUSEA AND VOMITING THAT IS NOT CONTROLLED WITH YOUR NAUSEA MEDICATION *UNUSUAL SHORTNESS OF BREATH *UNUSUAL BRUISING OR BLEEDING *URINARY PROBLEMS (pain or burning when urinating, or frequent urination) *BOWEL PROBLEMS (unusual diarrhea, constipation, pain near the anus) TENDERNESS IN MOUTH AND THROAT WITH OR WITHOUT PRESENCE OF ULCERS (sore throat, sores in mouth, or a toothache) UNUSUAL RASH, SWELLING OR PAIN  UNUSUAL VAGINAL DISCHARGE OR ITCHING   Items with * indicate a potential emergency and should be followed up as soon as possible or go to the Emergency Department if any problems should occur.  Please show the  CHEMOTHERAPY ALERT CARD or IMMUNOTHERAPY ALERT CARD at check-in to the Emergency Department and triage nurse.  Should you have questions after your visit or need to cancel or reschedule your appointment, please contact [facility] - A DEPT OF [facility]  Dept: [phone]  and follow the prompts.  Office hours are 8:00 a.m. to 4:30 p.m. Monday - Friday. Please note that voicemails left after 4:00 p.m. may not be returned until the following business day.  We are closed weekends and major holidays. You have access to a nurse at all times for urgent questions. Please call the main number to the clinic Dept: [phone] and follow the prompts.   For any non-urgent questions, you may also contact your provider using MyChart. We now offer e-Visits for anyone 64 and older to request care online for non-urgent symptoms. For details visit [email or web address].   Also download the MyChart app! Go to the app store, search "MyChart", open the app, select [facility], and log in with your MyChart username and password.

## 2023-12-23 ENCOUNTER — Ambulatory Visit: Admitting: Nurse Practitioner

## 2023-12-23 ENCOUNTER — Ambulatory Visit

## 2023-12-23 ENCOUNTER — Other Ambulatory Visit

## 2023-12-24 ENCOUNTER — Inpatient Hospital Stay

## 2023-12-24 VITALS — BP 141/56 | HR 58 | Temp 97.7°F | Resp 17

## 2023-12-24 DIAGNOSIS — Z5112 Encounter for antineoplastic immunotherapy: Secondary | ICD-10-CM | POA: Diagnosis not present

## 2023-12-24 DIAGNOSIS — C189 Malignant neoplasm of colon, unspecified: Secondary | ICD-10-CM

## 2023-12-24 MED ORDER — PEGFILGRASTIM INJECTION 6 MG/0.6ML ~~LOC~~
6.0000 mg | PREFILLED_SYRINGE | Freq: Once | SUBCUTANEOUS | Status: AC
  Administered 2023-12-24: 6 mg via SUBCUTANEOUS
  Filled 2023-12-24: qty 0.6

## 2023-12-26 ENCOUNTER — Encounter

## 2023-12-28 DIAGNOSIS — C787 Secondary malignant neoplasm of liver and intrahepatic bile duct: Secondary | ICD-10-CM | POA: Diagnosis not present

## 2023-12-28 DIAGNOSIS — C189 Malignant neoplasm of colon, unspecified: Secondary | ICD-10-CM | POA: Diagnosis not present

## 2024-01-01 ENCOUNTER — Encounter: Payer: Self-pay | Admitting: Nurse Practitioner

## 2024-01-01 ENCOUNTER — Encounter: Payer: Self-pay | Admitting: Hematology

## 2024-01-05 ENCOUNTER — Ambulatory Visit

## 2024-01-05 ENCOUNTER — Inpatient Hospital Stay

## 2024-01-05 ENCOUNTER — Ambulatory Visit: Admitting: Hematology

## 2024-01-05 ENCOUNTER — Other Ambulatory Visit

## 2024-01-05 ENCOUNTER — Inpatient Hospital Stay: Attending: Hematology

## 2024-01-05 ENCOUNTER — Inpatient Hospital Stay: Admitting: Hematology

## 2024-01-05 VITALS — BP 124/64 | HR 78 | Temp 97.2°F | Resp 17 | Ht 68.0 in | Wt 237.6 lb

## 2024-01-05 DIAGNOSIS — Z5111 Encounter for antineoplastic chemotherapy: Secondary | ICD-10-CM | POA: Insufficient documentation

## 2024-01-05 DIAGNOSIS — C787 Secondary malignant neoplasm of liver and intrahepatic bile duct: Secondary | ICD-10-CM

## 2024-01-05 DIAGNOSIS — Z79899 Other long term (current) drug therapy: Secondary | ICD-10-CM | POA: Diagnosis not present

## 2024-01-05 DIAGNOSIS — C189 Malignant neoplasm of colon, unspecified: Secondary | ICD-10-CM

## 2024-01-05 DIAGNOSIS — C184 Malignant neoplasm of transverse colon: Secondary | ICD-10-CM | POA: Diagnosis present

## 2024-01-05 DIAGNOSIS — Z5112 Encounter for antineoplastic immunotherapy: Secondary | ICD-10-CM | POA: Diagnosis present

## 2024-01-05 DIAGNOSIS — C772 Secondary and unspecified malignant neoplasm of intra-abdominal lymph nodes: Secondary | ICD-10-CM | POA: Diagnosis not present

## 2024-01-05 LAB — CBC WITH DIFFERENTIAL (CANCER CENTER ONLY)
Abs Immature Granulocytes: 0.2 K/uL — ABNORMAL HIGH (ref 0.00–0.07)
Basophils Absolute: 0.1 K/uL (ref 0.0–0.1)
Basophils Relative: 1 %
Eosinophils Absolute: 0.4 K/uL (ref 0.0–0.5)
Eosinophils Relative: 3 %
HCT: 36.1 % (ref 36.0–46.0)
Hemoglobin: 11.7 g/dL — ABNORMAL LOW (ref 12.0–15.0)
Immature Granulocytes: 2 %
Lymphocytes Relative: 16 %
Lymphs Abs: 2 K/uL (ref 0.7–4.0)
MCH: 31.2 pg (ref 26.0–34.0)
MCHC: 32.4 g/dL (ref 30.0–36.0)
MCV: 96.3 fL (ref 80.0–100.0)
Monocytes Absolute: 0.8 K/uL (ref 0.1–1.0)
Monocytes Relative: 6 %
Neutro Abs: 9.7 K/uL — ABNORMAL HIGH (ref 1.7–7.7)
Neutrophils Relative %: 72 %
Platelet Count: 236 K/uL (ref 150–400)
RBC: 3.75 MIL/uL — ABNORMAL LOW (ref 3.87–5.11)
RDW: 15 % (ref 11.5–15.5)
WBC Count: 13.2 K/uL — ABNORMAL HIGH (ref 4.0–10.5)
nRBC: 0 % (ref 0.0–0.2)

## 2024-01-05 LAB — CMP (CANCER CENTER ONLY)
ALT: 13 U/L (ref 0–44)
AST: 19 U/L (ref 15–41)
Albumin: 4.2 g/dL (ref 3.5–5.0)
Alkaline Phosphatase: 154 U/L — ABNORMAL HIGH (ref 38–126)
Anion gap: 6 (ref 5–15)
BUN: 17 mg/dL (ref 8–23)
CO2: 25 mmol/L (ref 22–32)
Calcium: 10 mg/dL (ref 8.9–10.3)
Chloride: 105 mmol/L (ref 98–111)
Creatinine: 0.96 mg/dL (ref 0.44–1.00)
GFR, Estimated: >60 mL/min (ref >60)
Glucose, Bld: 82 mg/dL (ref 70–99)
Potassium: 3.9 mmol/L (ref 3.5–5.1)
Sodium: 136 mmol/L (ref 135–145)
Total Bilirubin: 0.3 mg/dL (ref 0.0–1.2)
Total Protein: 8 g/dL (ref 6.5–8.1)

## 2024-01-05 LAB — TOTAL PROTEIN, URINE DIPSTICK: Protein, ur: NEGATIVE mg/dL

## 2024-01-05 LAB — CEA (ACCESS): CEA (CHCC): 330.46 ng/mL — ABNORMAL HIGH (ref 0.00–5.00)

## 2024-01-05 MED ORDER — SODIUM CHLORIDE 0.9 % IV SOLN
2200.0000 mg/m2 | INTRAVENOUS | Status: DC
  Administered 2024-01-05: 5000 mg via INTRAVENOUS
  Filled 2024-01-05: qty 100

## 2024-01-05 MED ORDER — DEXAMETHASONE SODIUM PHOSPHATE 10 MG/ML IJ SOLN
10.0000 mg | Freq: Once | INTRAMUSCULAR | Status: AC
  Administered 2024-01-05: 10 mg via INTRAVENOUS
  Filled 2024-01-05: qty 1

## 2024-01-05 MED ORDER — ATROPINE SULFATE 1 MG/ML IV SOLN
0.5000 mg | Freq: Once | INTRAVENOUS | Status: AC | PRN
  Administered 2024-01-05: 0.5 mg via INTRAVENOUS
  Filled 2024-01-05: qty 1

## 2024-01-05 MED ORDER — SODIUM CHLORIDE 0.9 % IV SOLN
400.0000 mg/m2 | Freq: Once | INTRAVENOUS | Status: AC
  Administered 2024-01-05: 912 mg via INTRAVENOUS
  Filled 2024-01-05: qty 45.6

## 2024-01-05 MED ORDER — SODIUM CHLORIDE 0.9 % IV SOLN
5.0000 mg/kg | Freq: Once | INTRAVENOUS | Status: AC
  Administered 2024-01-05: 500 mg via INTRAVENOUS
  Filled 2024-01-05: qty 4

## 2024-01-05 MED ORDER — SODIUM CHLORIDE 0.9 % IV SOLN: INTRAVENOUS | Status: DC

## 2024-01-05 MED ORDER — PALONOSETRON HCL INJECTION 0.25 MG/5ML
0.2500 mg | Freq: Once | INTRAVENOUS | Status: AC
  Administered 2024-01-05: 0.25 mg via INTRAVENOUS
  Filled 2024-01-05: qty 5

## 2024-01-05 MED ORDER — SODIUM CHLORIDE 0.9 % IV SOLN
180.0000 mg/m2 | Freq: Once | INTRAVENOUS | Status: AC
  Administered 2024-01-05: 400 mg via INTRAVENOUS
  Filled 2024-01-05: qty 15

## 2024-01-05 NOTE — Patient Instructions (Signed)
 [facility] - A DEPT OF [facility]   Discharge Instructions: Thank you for choosing [facility] to provide your oncology and hematology care.   If you have a lab appointment with the Cancer Center, please go directly to the Cancer Center and check in at the registration area.   Wear comfortable clothing and clothing appropriate for easy access to any Portacath or PICC line.   We strive to give you quality time with your provider. You may need to reschedule your appointment if you arrive late (15 or more minutes).  Arriving late affects you and other patients whose appointments are after yours.  Also, if you miss three or more appointments without notifying the office, you may be dismissed from the clinic at the provider's discretion.      For prescription refill requests, have your pharmacy contact our office and allow 72 hours for refills to be completed.    Today you received the following chemotherapy and/or immunotherapy agents: Bevacizumab  (Mvasi ), Irinotecan , Leucovorin , and Fluorouracil  (Adrucil )      To help prevent nausea and vomiting after your treatment, we encourage you to take your nausea medication as directed.  BELOW ARE SYMPTOMS THAT SHOULD BE REPORTED IMMEDIATELY: *FEVER GREATER THAN 100.4 F (38 C) OR HIGHER *CHILLS OR SWEATING *NAUSEA AND VOMITING THAT IS NOT CONTROLLED WITH YOUR NAUSEA MEDICATION *UNUSUAL SHORTNESS OF BREATH *UNUSUAL BRUISING OR BLEEDING *URINARY PROBLEMS (pain or burning when urinating, or frequent urination) *BOWEL PROBLEMS (unusual diarrhea, constipation, pain near the anus) TENDERNESS IN MOUTH AND THROAT WITH OR WITHOUT PRESENCE OF ULCERS (sore throat, sores in mouth, or a toothache) UNUSUAL RASH, SWELLING OR PAIN  UNUSUAL VAGINAL DISCHARGE OR ITCHING   Items with * indicate a potential emergency and should be followed up as soon as possible or go to the Emergency Department if any problems should  occur.  Please show the CHEMOTHERAPY ALERT CARD or IMMUNOTHERAPY ALERT CARD at check-in to the Emergency Department and triage nurse.  Should you have questions after your visit or need to cancel or reschedule your appointment, please contact [facility] - A DEPT [facility]  Dept: [phone]  and follow the prompts.  Office hours are 8:00 a.m. to 4:30 p.m. Monday - Friday. Please note that voicemails left after 4:00 p.m. may not be returned until the following business day.  We are closed weekends and major holidays. You have access to a nurse at all times for urgent questions. Please call the main number to the clinic Dept: [phone] and follow the prompts.   For any non-urgent questions, you may also contact your provider using MyChart. We now offer e-Visits for anyone 55 and older to request care online for non-urgent symptoms. For details visit [email or web address].   Also download the MyChart app! Go to the app store, search MyChart, open the app, select [place], and log in with your MyChart username and password.

## 2024-01-05 NOTE — Assessment & Plan Note (Signed)
 [id number] stage IV with liver and nodal metastasis, MSS, KRAS G12S(+)  -Diagnosed in 01/2019 after emergent colectomy and liver biopsy. 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  -Started first line chemo on 03/12/2019, received 5FU/leuc with first 2 cycles for large open abdominal wound after surgery. She started full dose FOLFOX and avastin  with cycle 2. Due to neuropathy Oxaliplatin  was stopped after 09/24/19.  --For convenience she was switched to oral chemo Xeloda  2 weeks on/1 week off and Bev every 3 weeks in 01/21/20. She is tolerating well -Her restaging CT scan from 03/11/2023 showed stable liver metastasis, no other new lesions.  -CT 09/29/2023 showed mild disease progression in liver  -treatment changed to FOLFIRI and beva on 10/27/2023

## 2024-01-05 NOTE — Progress Notes (Signed)
 Per Dr. Lanny, and verified by Leotis Ferries, [facility], okay to change home pump to a rate of 5.7 mL/hr for 44 hours.

## 2024-01-05 NOTE — Progress Notes (Signed)
 [name]   [phone] Fax:(336) [phone]   Clinic Follow up Note   Patient Care Team: Casey Black, [name], MD as PCP - General (Internal Medicine) [name], MD as PCP - Cardiology (Cardiology) [name], MD as Consulting Physician (General Surgery) [name], MD as Consulting Physician (Hematology) [name], [name], NP as Nurse Practitioner (Nurse Practitioner)  Date of Service:  01/05/2024  CHIEF COMPLAINT: f/u of metastatic colon cancer  CURRENT THERAPY:  Chemotherapy FOLFIRI and bevacizumab   Oncology History   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  -Started first line chemo on 03/12/2019, received 5FU/leuc with first 2 cycles for large open abdominal wound after surgery. She started full dose FOLFOX and avastin  with cycle 2. Due to neuropathy Oxaliplatin  was stopped after 09/24/19.  --For convenience she was switched to oral chemo Xeloda  2 weeks on/1 week off and Bev every 3 weeks in 01/21/20. She is tolerating well -Her restaging CT scan from 03/11/2023 showed stable liver metastasis, no other new lesions.  -CT 09/29/2023 showed mild disease progression in liver  -treatment changed to FOLFIRI and beva on 10/27/2023  Assessment & Plan Metastatic colon cancer 63 year old female with metastatic colon cancer experiencing intermittent numbness in the left arm, possibly related to physical activity. Previous EKG normal, no history of heart disease, only a heart murmur. Numbness not associated with chemotherapy pump. Experienced significant weakness and flu-like symptoms after last chemotherapy shot, resolved after a few days. White blood cell count slightly elevated, hemoglobin well-managed. No current issues with neuropathy  or diarrhea. Due for follow-up scan as last scan was in June. - Order follow-up scan. - Hold chemotherapy shot this cycle due to elevated white blood cell count. - Advise taking Claritin or Zyrtec daily for five days with future chemotherapy shots to prevent flu-like symptoms. - Administer flu shot; coordinate with infusion nurse or pharmacy. - Schedule next chemotherapy session for the week after Thanksgiving to avoid treatment on the holiday.  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 Bowel-gas pattern consistent with small bowel obstruction. No free air. 2. No acute chest findings.   01/23/2019 Imaging   CT AP IMPRESSION: Obstructing mid transverse colonic mass with mild regional adenopathy and hepatic metastatic disease. The mass likely extends through the serosa; no ascites or peritoneal nodularity.   01/24/2019 Surgery   Surgeon: [name] Signe MD Assistant: Harlene Ferraris PA-C Procedure performed: Transverse colectomy with end colostomy, liver biopsy Procedure classification: URGENT/EMERGENT Preop diagnosis: Obstructing, metastatic transverse colon mass Post-op diagnosis/intraop findings: Same   01/24/2019 Pathology Results   FINAL MICROSCOPIC DIAGNOSIS:   A. COLON, TRANSVERSE, RESECTION:  Colonic adenocarcinoma, 5 cm.  Carcinoma extends into pericolonic connective tissue and focally to  serosal  surface.  Margins not involved.  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   B. LIVER NODULE, LEFT, BIOPSY:  Metastatic adenocarcinoma.    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 Hypermetabolic metastatic disease in the liver and mediastinal/hilar/axillary lymph nodes. 2. Focal hypermetabolism in the rectum. Continued attention on follow-up exams is warranted. 3. Focal hypermetabolism medial to the right adrenal gland may be within a metastatic lymph node, better visualized on 01/23/2019. 4. Aortic atherosclerosis (ICD10-170.0). Coronary artery calcification.   03/12/2019 -  Chemotherapy   She started 5FU q2weeks on 03/12/19 for 2 cycles. She started full dose FOLFOX with Avastin  on 04/09/19. Oxaliplatin  dose reduced repeatedly due to neuropathy C12 and held since C16 on 10/09/19. Now on maintenance Avastin  and 5FU q2weeks since 10/09/19       -Maintenance change to maintenance xeloda  2000 mg BID days 1-14 q21 days and q3 weeks Zirabev  (15 mg/kg) starting 01/21/20. First cycle was taken 1000mg  BID due to misunderstanding.    05/31/2019 Imaging   Restaging CT CAP IMPRESSION: 1. Similar to mild interval decrease in size of multiple hepatic lesions, partially calcified. 2. 2 mm right upper lobe pulmonary nodule. Recommend attention on follow-up. 3. Emphysema and aortic atherosclerosis.   08/23/2019 Imaging   CT CAP w contrast  IMPRESSION: 1. The dominant peripheral right liver metastasis has mildly increased. Other smaller liver metastases are stable. 2. Otherwise no new or progressive metastatic disease in the chest, abdomen or pelvis. 3. Aortic Atherosclerosis (ICD10-I70.0) and Emphysema  (ICD10-J43.9).   11/27/2019 Imaging   CT CAP w contrast  IMPRESSION: Status post transverse colectomy with right mid abdominal colostomy.   Mildly progressive hepatic metastases, as above.   No evidence of metastatic disease in the chest. Small mediastinal lymph nodes are within normal limits.   Additional stable ancillary findings as above.   01/21/2020 - 10/06/2023 Chemotherapy   Patient is on Treatment Plan : COLORECTAL Bevacizumab  q21d     02/21/2020 Imaging   IMPRESSION: 1. Stable hepatic metastatic disease. 2. Aortic atherosclerosis (ICD10-I70.0). Coronary artery calcification. 3.  Emphysema (ICD10-J43.9).   05/19/2020 Imaging   CT CAP  IMPRESSION: 1. Multiple partially calcified liver metastases are again noted. With the exception of a small lesion in segment 7/8 lesions are not significantly changed in the interval. No new liver lesions identified. 2. Coronary artery atherosclerotic calcifications. 3. Aortic atherosclerosis. 4. 2 mm right upper lobe lung nodule identified.  Unchanged.   Aortic Atherosclerosis (ICD10-I70.0).   11/17/2020 Imaging   CT CAP  IMPRESSION: 1. Partially calcified lesions throughout the liver are stable accounting for differences in technique, contrasted imaging on today's study is compared to noncontrast imaging on the prior. 2. No new hepatic lesions. 3. Tiny 3 mm pulmonary nodule in the RIGHT upper lobe unchanged since the prior study. Attention on follow-up. 4. Mild fullness of RIGHT paratracheal nodal tissue is minimally increased and borderline enlarged, attention on follow-up. 5. RIGHT lower quadrant colostomy. 6. 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 7. Aortic atherosclerosis.   06/09/2021 Imaging   EXAM: CT CHEST, ABDOMEN, AND PELVIS WITH CONTRAST  IMPRESSION:  Stable calcified liver metastases.   Stable  mildly enlarged right paratracheal lymph node.   No evidence of new or progressive metastatic disease.   Aortic Atherosclerosis (ICD10-I70.0).   09/14/2021 PET scan   IMPRESSION: Multiple hypermetabolic and partially calcified liver metastases, without significant change compared to most recent CT.   Mild hypermetabolic mediastinal and bilateral hilar lymphadenopathy, also without significant change.   No evidence of new or progressive metastatic disease.     Electronically Signed   By: Norleen DELENA Kil M.D.   On: 09/15/2021 11:04   04/15/2022 Imaging    IMPRESSION: 1. Several previously noted partially calcified hepatic metastases appear grossly stable compared to the prior examination. No new hepatic lesions or other signs of metastatic disease noted elsewhere in the chest, abdomen or pelvis. 2. Aortic atherosclerosis, in addition to left main and three-vessel coronary artery disease. Please note that although the presence of coronary artery calcium  documents the presence of coronary artery disease, the severity of this disease and any potential stenosis cannot be assessed on this non-gated CT examination. Assessment for potential risk factor modification, dietary therapy or pharmacologic therapy may be warranted, if clinically indicated. 3. There are calcifications of the mitral annulus. Echocardiographic correlation for evaluation of potential valvular dysfunction may be warranted if clinically indicated. 4. Additional incidental findings, similar to prior studies, as above.   07/15/2022 Imaging    IMPRESSION: 1. Stable exam. No new or progressive findings in the abdomen or pelvis to suggest worsening disease. 2. No substantial change in calcified liver metastases. 3. Right abdominal transverse end colostomy with parastomal herniation of fat and colon proximal to the stoma. 4. Probable left-sided uterine fibroid measuring on the order of 3.1 cm and displacing the  endometrial stripe to the right. Pelvic ultrasound could be used to further evaluate as clinically warranted. 5.  Aortic Atherosclerosis (ICD10-I70.0).     07/07/2023 Imaging   CT chest, abdomen, and pelvis IMPRESSION: Partially calcified treated hepatic metastatic disease remains stable, except for mild increase in size of a single small low-attenuation lesion in central left hepatic lobe suspicious for enlarging metastasis. Recommend correlation with tumor markers, and consider short-term follow-up by CT in 3-6 months.   No other sites of metastatic disease identified.   Stable uterine fibroid.     10/27/2023 -  Chemotherapy   Patient is on Treatment Plan : COLORECTAL FOLFIRI + Bevacizumab  q14d        Discussed the use of AI scribe software for clinical note transcription with the patient, who gave verbal consent to proceed.  History of Present Illness Elim Peale is a 63 year old female with metastatic colon cancer who presents for follow-up.  She experiences intermittent numbness in her left arm, which is distinct from neck or shoulder pain and does not occur during chemotherapy. An EKG was normal, and she has a heart murmur but no heart disease.  She had an episode of weakness and malaise after a chemotherapy shot, similar to COVID-19 symptoms, leaving her bedridden for two days without fever.  She denies current arm weakness, recent breast surgery, fever, or diarrhea.     All other systems were reviewed with the patient and are negative.  MEDICAL HISTORY:  Past Medical History:  Diagnosis Date   Anemia    low iron   Colon cancer (HCC) 01/2019   Hypertension 01/23/2019   Personal history of chemotherapy 01/2019   colon CA   SBO (small bowel obstruction) (HCC) 01/23/2019    SURGICAL HISTORY:  Past Surgical History:  Procedure Laterality Date   CESAREAN SECTION     x2   COLOSTOMY N/A 01/24/2019   Procedure: End Loop Colostomy;  Surgeon: [name],  MD;  Location: [facility] OR;  Service: General;  Laterality: N/A;   PARTIAL COLECTOMY N/A 01/24/2019   Procedure: PARTIAL COLECTOMY;  Surgeon: [name], MD;  Location: [facility] OR;  Service: General;  Laterality: N/A;   PORTACATH PLACEMENT Right 02/28/2019   Procedure: INSERTION PORT-A-CATH WITH ULTRASOUND GUIDANCE;  Surgeon: [name], MD;  Location: [facility] OR;  Service: General;  Laterality: Right;    I have reviewed the social history and family history with the patient and they are unchanged from previous note.  ALLERGIES:  has no known allergies.  MEDICATIONS:  Current Outpatient Medications  Medication Sig Dispense Refill   acetaminophen  (TYLENOL ) 325 MG tablet Take 2 tablets (650 mg total) by mouth every 6 (six) hours as needed.     amLODipine  (NORVASC ) 10 MG tablet TAKE 1 TABLET BY MOUTH EVERY DAY 90 tablet 1   benzonatate  (TESSALON ) 100 MG capsule Take 1 capsule (100 mg total) by mouth 2 (two) times daily as needed for cough. 20 capsule 0   dexamethasone  (DECADRON ) 4 MG tablet Take 2 tablets (8 mg total) by mouth daily. Start the day after chemotherapy for 2 days. Take with food. 8 tablet 5   ferrous sulfate  325 (65 FE) MG tablet TAKE 1 TABLET BY MOUTH TWICE A DAY WITH FOOD 180 tablet 1   gabapentin  (NEURONTIN ) 300 MG capsule TAKE 1 CAPSULE BY MOUTH TWICE A DAY 60 capsule 3   hydrALAZINE  (APRESOLINE ) 25 MG tablet TAKE 1 TABLET BY MOUTH EVERY 8 HOURS 270 tablet 0   hydrochlorothiazide  (HYDRODIURIL ) 12.5 MG tablet TAKE 1 TABLET BY MOUTH EVERY DAY 90 tablet 1   hydrocortisone  (ANUSOL -HC) 2.5 % rectal cream Place 1 application rectally 2 (two) times daily. 30 g 0   Ibuprofen  200 MG CAPS Take 400 mg by mouth daily as needed (pain).     isosorbide  mononitrate (IMDUR ) 30 MG 24 hr tablet TAKE 1 TABLET BY MOUTH EVERY DAY 90 tablet 1   levothyroxine  (SYNTHROID ) 25 MCG tablet TAKE 1 TABLET BY MOUTH EVERY DAY 90 tablet 1   lidocaine  (LMX) 4 % cream Apply 1 application topically 3 (three) times  daily as needed. 30 g 0   lidocaine -prilocaine  (EMLA ) cream APPLY 1 APPLICATION TOPICALLY AS NEEDED. 30 g 1   lidocaine -prilocaine  (EMLA ) cream Apply to affected area once 30 g 3   lisinopril  (ZESTRIL ) 40 MG tablet TAKE 1 TABLET BY MOUTH EVERY DAY 90 tablet 1   loperamide  (IMODIUM ) 2 MG capsule Take 2 tabs by mouth with first loose stool, then 1 tab with each additional loose stool as needed. Do not exceed 8 tabs in a 24-hour period 60 capsule 3   ondansetron  (ZOFRAN ) 8 MG tablet Take 1 tablet (8 mg total) by mouth every 8 (eight) hours as needed for nausea or vomiting. 20 tablet 2   ondansetron  (ZOFRAN ) 8 MG tablet Take 1 tablet (8 mg total) by mouth every 8 (eight) hours as needed for nausea, vomiting or refractory nausea / vomiting. Start on the third day after chemotherapy. 30 tablet 1   polycarbophil (FIBERCON) 625 MG tablet Take 1 tablet (625 mg total) by mouth daily. 30 tablet 0   prochlorperazine  (COMPAZINE ) 10 MG tablet Take 1 tablet (10 mg total) by mouth every 6 (six) hours as needed for nausea or vomiting. 30  tablet 2   prochlorperazine  (COMPAZINE ) 10 MG tablet Take 1 tablet (10 mg total) by mouth every 6 (six) hours as needed for nausea or vomiting. 30 tablet 1   No current facility-administered medications for this visit.   Facility-Administered Medications Ordered in Other Visits  Medication Dose Route Frequency Provider Last Rate Last Admin   0.9 %  sodium chloride  infusion   Intravenous Continuous [name], MD 10 mL/hr at 01/05/24 1338 New Bag at 01/05/24 1338   bevacizumab -awwb (MVASI ) 500 mg in sodium chloride  0.9 % 100 mL chemo infusion  5 mg/kg (Treatment Plan Recorded) Intravenous Once [name], MD       dexamethasone  (DECADRON ) injection 10 mg  10 mg Intravenous Once [name], MD       fluorouracil  (ADRUCIL ) 5,000 mg in sodium chloride  0.9 % 150 mL chemo infusion  2,200 mg/m2 (Treatment Plan Recorded) Intravenous 1 day or 1 dose [name], MD       irinotecan  (CAMPTOSAR )  400 mg in sodium chloride  0.9 % 500 mL chemo infusion  180 mg/m2 (Treatment Plan Recorded) Intravenous Once [name], MD       leucovorin  912 mg in sodium chloride  0.9 % 250 mL infusion  400 mg/m2 (Treatment Plan Recorded) Intravenous Once [name], MD       palonosetron  (ALOXI ) injection 0.25 mg  0.25 mg Intravenous Once [name], MD       sodium chloride  flush (NS) 0.9 % injection 10 mL  10 mL Intracatheter PRN [name], MD   10 mL at 06/06/20 1051    PHYSICAL EXAMINATION: ECOG PERFORMANCE STATUS: 1 - Symptomatic but completely ambulatory  Vitals:   01/05/24 1255  BP: 124/64  Pulse: 78  Resp: 17  Temp: (!) 97.2 F (36.2 C)  SpO2: 98%   Wt Readings from Last 3 Encounters:  01/05/24 237 lb 9.6 oz (107.8 kg)  12/22/23 241 lb 11.2 oz (109.6 kg)  12/08/23 241 lb 6.4 oz (109.5 kg)     GENERAL:alert, no distress and comfortable SKIN: skin color, texture, turgor are normal, no rashes or significant lesions EYES: normal, Conjunctiva are pink and non-injected, sclera clear NECK: supple, thyroid  normal size, non-tender, without nodularity LYMPH:  no palpable lymphadenopathy in the cervical, axillary  LUNGS: clear to auscultation and percussion with normal breathing effort HEART: regular rate & rhythm and no murmurs and no lower extremity edema ABDOMEN:abdomen soft, non-tender and normal bowel sounds Musculoskeletal:no cyanosis of digits and no clubbing  NEURO: alert & oriented x 3 with fluent speech, no focal motor/sensory deficits  Physical Exam NEUROLOGICAL: Motor strength symmetric and normal in upper extremities.  LABORATORY DATA:  I have reviewed the data as listed    Latest Ref Rng & Units 01/05/2024   12:38 PM 12/22/2023    9:41 AM 12/08/2023    7:57 AM  CBC  WBC 4.0 - 10.5 K/uL 13.2  11.5  5.1   Hemoglobin 12.0 - 15.0 g/dL 88.2  88.3  88.9   Hematocrit 36.0 - 46.0 % 36.1  36.3  33.1   Platelets 150 - 400 K/uL 236  168  200         Latest Ref Rng & Units 01/05/2024    12:38 PM 12/22/2023    9:41 AM 12/08/2023    7:57 AM  CMP  Glucose 70 - 99 mg/dL 82  897  89   BUN 8 - 23 mg/dL 17  16  24    Creatinine 0.44 - 1.00 mg/dL 9.03  0.98  0.98   Sodium 135 - 145 mmol/L 136  140  139   Potassium 3.5 - 5.1 mmol/L 3.9  4.4  4.2   Chloride 98 - 111 mmol/L 105  108  107   CO2 22 - 32 mmol/L 25  28  27    Calcium  8.9 - 10.3 mg/dL 89.9  9.6  9.6   Total Protein 6.5 - 8.1 g/dL 8.0  7.3  7.3   Total Bilirubin 0.0 - 1.2 mg/dL 0.3  0.3  0.4   Alkaline Phos 38 - 126 U/L 154  156  104   AST 15 - 41 U/L 19  18  18    ALT 0 - 44 U/L 13  12  12        RADIOGRAPHIC STUDIES: I have personally reviewed the radiological images as listed and agreed with the findings in the report. No results found.    Orders Placed This Encounter  Procedures   CT CHEST ABDOMEN PELVIS W CONTRAST    Standing Status:   Future    Expected Date:   01/26/2024    Expiration Date:   01/04/2025    If indicated for the ordered procedure, I authorize the administration of contrast media per Radiology protocol:   Yes    Does the patient have a contrast media/X-ray dye allergy?:   No    Preferred imaging location?:   [facility]    If indicated for the ordered procedure, I authorize the administration of oral contrast media per Radiology protocol:   Yes   CBC with Differential (Cancer Center Only)    Standing Status:   Future    Expected Date:   01/19/2024    Expiration Date:   01/18/2025   CMP (Cancer Center only)    Standing Status:   Future    Expected Date:   01/19/2024    Expiration Date:   01/18/2025   Total Protein, Urine dipstick    Standing Status:   Future    Expected Date:   01/19/2024    Expiration Date:   01/18/2025   CBC with Differential (Cancer Center Only)    Standing Status:   Future    Expected Date:   02/02/2024    Expiration Date:   02/01/2025   CMP (Cancer Center only)    Standing Status:   Future    Expected Date:   02/02/2024    Expiration Date:    02/01/2025   Total Protein, Urine dipstick    Standing Status:   Future    Expected Date:   02/02/2024    Expiration Date:   02/01/2025   CBC with Differential (Cancer Center Only)    Standing Status:   Future    Expected Date:   02/16/2024    Expiration Date:   02/15/2025   CMP (Cancer Center only)    Standing Status:   Future    Expected Date:   02/16/2024    Expiration Date:   02/15/2025   Total Protein, Urine dipstick    Standing Status:   Future    Expected Date:   02/16/2024    Expiration Date:   02/15/2025   CBC with Differential (Cancer Center Only)    Standing Status:   Future    Expected Date:   03/08/2024    Expiration Date:   03/08/2025   CMP (Cancer Center only)    Standing Status:   Future    Expected Date:   03/08/2024  Expiration Date:   03/08/2025   Total Protein, Urine dipstick    Standing Status:   Future    Expected Date:   03/08/2024    Expiration Date:   03/08/2025   All questions were answered. The patient knows to call the clinic with any problems, questions or concerns. No barriers to learning was detected. The total time spent in the appointment was 25 minutes, including review of chart and various tests results, discussions about plan of care and coordination of care plan     Onita Mattock, MD 01/05/2024

## 2024-01-06 ENCOUNTER — Telehealth: Payer: Self-pay | Admitting: Hematology

## 2024-01-06 NOTE — Telephone Encounter (Signed)
 Casey Black has been scheduled and made aware of her follow up appointments scheduled for 10/16.

## 2024-01-07 ENCOUNTER — Inpatient Hospital Stay

## 2024-01-07 ENCOUNTER — Encounter

## 2024-01-13 DIAGNOSIS — T8131XD Disruption of external operation (surgical) wound, not elsewhere classified, subsequent encounter: Secondary | ICD-10-CM | POA: Diagnosis not present

## 2024-01-13 DIAGNOSIS — K56609 Unspecified intestinal obstruction, unspecified as to partial versus complete obstruction: Secondary | ICD-10-CM | POA: Diagnosis not present

## 2024-01-13 DIAGNOSIS — S31109A Unspecified open wound of abdominal wall, unspecified quadrant without penetration into peritoneal cavity, initial encounter: Secondary | ICD-10-CM | POA: Diagnosis not present

## 2024-01-18 ENCOUNTER — Telehealth: Payer: Self-pay | Admitting: Hematology

## 2024-01-18 NOTE — Telephone Encounter (Signed)
 Casey Black has been contacted and made aware of her 10/30-11/1 appts.

## 2024-01-19 ENCOUNTER — Inpatient Hospital Stay

## 2024-01-19 ENCOUNTER — Telehealth: Payer: Self-pay | Admitting: *Deleted

## 2024-01-19 ENCOUNTER — Inpatient Hospital Stay (HOSPITAL_BASED_OUTPATIENT_CLINIC_OR_DEPARTMENT_OTHER): Admitting: Hematology

## 2024-01-19 VITALS — BP 134/64 | HR 70 | Temp 97.3°F | Resp 17 | Ht 68.0 in | Wt 241.1 lb

## 2024-01-19 VITALS — BP 150/62 | HR 65 | Temp 97.5°F | Resp 20

## 2024-01-19 DIAGNOSIS — C189 Malignant neoplasm of colon, unspecified: Secondary | ICD-10-CM | POA: Diagnosis not present

## 2024-01-19 DIAGNOSIS — Z5112 Encounter for antineoplastic immunotherapy: Secondary | ICD-10-CM | POA: Diagnosis not present

## 2024-01-19 DIAGNOSIS — C787 Secondary malignant neoplasm of liver and intrahepatic bile duct: Secondary | ICD-10-CM | POA: Diagnosis not present

## 2024-01-19 LAB — CBC WITH DIFFERENTIAL (CANCER CENTER ONLY)
Abs Immature Granulocytes: 0.01 K/uL (ref 0.00–0.07)
Basophils Absolute: 0.1 K/uL (ref 0.0–0.1)
Basophils Relative: 1 %
Eosinophils Absolute: 0.6 K/uL — ABNORMAL HIGH (ref 0.0–0.5)
Eosinophils Relative: 11 %
HCT: 33.2 % — ABNORMAL LOW (ref 36.0–46.0)
Hemoglobin: 10.8 g/dL — ABNORMAL LOW (ref 12.0–15.0)
Immature Granulocytes: 0 %
Lymphocytes Relative: 29 %
Lymphs Abs: 1.5 K/uL (ref 0.7–4.0)
MCH: 31.1 pg (ref 26.0–34.0)
MCHC: 32.5 g/dL (ref 30.0–36.0)
MCV: 95.7 fL (ref 80.0–100.0)
Monocytes Absolute: 0.4 K/uL (ref 0.1–1.0)
Monocytes Relative: 8 %
Neutro Abs: 2.6 K/uL (ref 1.7–7.7)
Neutrophils Relative %: 51 %
Platelet Count: 187 K/uL (ref 150–400)
RBC: 3.47 MIL/uL — ABNORMAL LOW (ref 3.87–5.11)
RDW: 14.7 % (ref 11.5–15.5)
WBC Count: 5.2 K/uL (ref 4.0–10.5)
nRBC: 0 % (ref 0.0–0.2)

## 2024-01-19 LAB — CMP (CANCER CENTER ONLY)
ALT: 13 U/L (ref 0–44)
AST: 18 U/L (ref 15–41)
Albumin: 3.8 g/dL (ref 3.5–5.0)
Alkaline Phosphatase: 115 U/L (ref 38–126)
Anion gap: 3 — ABNORMAL LOW (ref 5–15)
BUN: 17 mg/dL (ref 8–23)
CO2: 27 mmol/L (ref 22–32)
Calcium: 9.9 mg/dL (ref 8.9–10.3)
Chloride: 107 mmol/L (ref 98–111)
Creatinine: 0.95 mg/dL (ref 0.44–1.00)
GFR, Estimated: >60 mL/min (ref >60)
Glucose, Bld: 99 mg/dL (ref 70–99)
Potassium: 4 mmol/L (ref 3.5–5.1)
Sodium: 137 mmol/L (ref 135–145)
Total Bilirubin: 0.4 mg/dL (ref 0.0–1.2)
Total Protein: 7 g/dL (ref 6.5–8.1)

## 2024-01-19 LAB — TOTAL PROTEIN, URINE DIPSTICK: Protein, ur: 30 mg/dL — AB

## 2024-01-19 MED ORDER — SODIUM CHLORIDE 0.9% FLUSH: 10.0000 mL | INTRAVENOUS | Status: DC | PRN

## 2024-01-19 MED ORDER — SODIUM CHLORIDE 0.9 % IV SOLN
400.0000 mg/m2 | Freq: Once | INTRAVENOUS | Status: AC
  Administered 2024-01-19: 912 mg via INTRAVENOUS
  Filled 2024-01-19: qty 45.6

## 2024-01-19 MED ORDER — ATROPINE SULFATE 1 MG/ML IV SOLN
0.5000 mg | Freq: Once | INTRAVENOUS | Status: AC | PRN
  Administered 2024-01-19: 0.5 mg via INTRAVENOUS
  Filled 2024-01-19: qty 1

## 2024-01-19 MED ORDER — SODIUM CHLORIDE 0.9 % IV SOLN: INTRAVENOUS | Status: DC

## 2024-01-19 MED ORDER — SODIUM CHLORIDE 0.9 % IV SOLN
2200.0000 mg/m2 | INTRAVENOUS | Status: DC
  Administered 2024-01-19: 5000 mg via INTRAVENOUS
  Filled 2024-01-19: qty 100

## 2024-01-19 MED ORDER — SODIUM CHLORIDE 0.9 % IV SOLN
180.0000 mg/m2 | Freq: Once | INTRAVENOUS | Status: AC
  Administered 2024-01-19: 400 mg via INTRAVENOUS
  Filled 2024-01-19: qty 15

## 2024-01-19 MED ORDER — DEXAMETHASONE SOD PHOSPHATE PF 10 MG/ML IJ SOLN
10.0000 mg | Freq: Once | INTRAMUSCULAR | Status: AC
  Administered 2024-01-19: 10 mg via INTRAVENOUS

## 2024-01-19 MED ORDER — SODIUM CHLORIDE 0.9 % IV SOLN
5.0000 mg/kg | Freq: Once | INTRAVENOUS | Status: AC
  Administered 2024-01-19: 500 mg via INTRAVENOUS
  Filled 2024-01-19: qty 4

## 2024-01-19 MED ORDER — PALONOSETRON HCL INJECTION 0.25 MG/5ML
0.2500 mg | Freq: Once | INTRAVENOUS | Status: AC
  Administered 2024-01-19: 0.25 mg via INTRAVENOUS
  Filled 2024-01-19: qty 5

## 2024-01-19 NOTE — Telephone Encounter (Signed)
 error

## 2024-01-19 NOTE — Patient Instructions (Addendum)
 [facility] - A DEPT OF [facility]  Discharge Instructions: Thank you for choosing [facility] to provide your oncology and hematology care.   If you have a lab appointment with the Cancer Center, please go directly to the Cancer Center and check in at the registration area.   Wear comfortable clothing and clothing appropriate for easy access to any Portacath or PICC line.   We strive to give you quality time with your provider. You may need to reschedule your appointment if you arrive late (15 or more minutes).  Arriving late affects you and other patients whose appointments are after yours.  Also, if you miss three or more appointments without notifying the office, you may be dismissed from the clinic at the provider's discretion.      For prescription refill requests, have your pharmacy contact our office and allow 72 hours for refills to be completed.    Today you received the following chemotherapy and/or immunotherapy agents: Bevacizumab, Irinotecan, Leucovorin, and Fluorouracil.      To help prevent nausea and vomiting after your treatment, we encourage you to take your nausea medication as directed.  BELOW ARE SYMPTOMS THAT SHOULD BE REPORTED IMMEDIATELY: *FEVER GREATER THAN 100.4 F (38 C) OR HIGHER *CHILLS OR SWEATING *NAUSEA AND VOMITING THAT IS NOT CONTROLLED WITH YOUR NAUSEA MEDICATION *UNUSUAL SHORTNESS OF BREATH *UNUSUAL BRUISING OR BLEEDING *URINARY PROBLEMS (pain or burning when urinating, or frequent urination) *BOWEL PROBLEMS (unusual diarrhea, constipation, pain near the anus) TENDERNESS IN MOUTH AND THROAT WITH OR WITHOUT PRESENCE OF ULCERS (sore throat, sores in mouth, or a toothache) UNUSUAL RASH, SWELLING OR PAIN  UNUSUAL VAGINAL DISCHARGE OR ITCHING   Items with * indicate a potential emergency and should be followed up as soon as possible or go to the Emergency Department if any problems should occur.  Please show  the CHEMOTHERAPY ALERT CARD or IMMUNOTHERAPY ALERT CARD at check-in to the Emergency Department and triage nurse.  Should you have questions after your visit or need to cancel or reschedule your appointment, please contact [facility] - A DEPT OF [facility]  Dept: [phone]  and follow the prompts.  Office hours are 8:00 a.m. to 4:30 p.m. Monday - Friday. Please note that voicemails left after 4:00 p.m. may not be returned until the following business day.  We are closed weekends and major holidays. You have access to a nurse at all times for urgent questions. Please call the main number to the clinic Dept: [phone] and follow the prompts.   For any non-urgent questions, you may also contact your provider using MyChart. We now offer e-Visits for anyone 61 and older to request care online for non-urgent symptoms. For details visit [email or web address].   Also download the MyChart app! Go to the app store, search "MyChart", open the app, select , and log in with your MyChart username and password.  The chemotherapy medication bag should finish at 46 hours, 96 hours, or 7 days. For example, if your pump is scheduled for 46 hours and it was put on at 4:00 p.m., it should finish at 2:00 p.m. the day it is scheduled to come off regardless of your appointment time.     Estimated time to finish at: 1:30 PM   If the display on your pump reads "Low Volume" and it is beeping, take the batteries out of the pump and come to the  cancer center for it to be taken off.   If the pump alarms go off prior to the pump reading "Low Volume" then call [phone] and someone can assist you.  If the plunger comes out and the chemotherapy medication is leaking out, please use your home chemo spill kit to clean up the spill. Do NOT use paper towels or other household products.  If you have problems or questions regarding your pump, please call either [phone]  (24 hours a day) or the cancer center Monday-Friday 8:00 a.m.- 4:30 p.m. at the clinic number and we will assist you. If you are unable to get assistance, then go to the nearest Emergency Department and ask the staff to contact the IV team for assistance.

## 2024-01-19 NOTE — Assessment & Plan Note (Signed)
 [id number] stage IV with liver and nodal metastasis, MSS, KRAS G12S(+)  -Diagnosed in 01/2019 after emergent colectomy and liver biopsy. 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  -Started first line chemo on 03/12/2019, received 5FU/leuc with first 2 cycles for large open abdominal wound after surgery. She started full dose FOLFOX and avastin  with cycle 2. Due to neuropathy Oxaliplatin  was stopped after 09/24/19.  --For convenience she was switched to oral chemo Xeloda  2 weeks on/1 week off and Bev every 3 weeks in 01/21/20. She is tolerating well -Her restaging CT scan from 03/11/2023 showed stable liver metastasis, no other new lesions.  -CT 09/29/2023 showed mild disease progression in liver  -treatment changed to FOLFIRI and beva on 10/27/2023

## 2024-01-19 NOTE — Progress Notes (Signed)
 [facility]   [phone] [phone]   Clinic Follow up Note   Patient Care Team: [name], [name], MD as PCP - General (Internal Medicine) [name], MD as PCP - Cardiology (Cardiology) [name], MD as Consulting Physician (General Surgery) [name], MD as Consulting Physician (Hematology) [name], [name], NP as Nurse Practitioner (Nurse Practitioner)  Date of Service:  01/19/2024  CHIEF COMPLAINT: f/u of colon cancer  CURRENT THERAPY:  Second line chemotherapy FOLFIRI and bevacizumab   Oncology History   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  -Started first line chemo on 03/12/2019, received 5FU/leuc with first 2 cycles for large open abdominal wound after surgery. She started full dose FOLFOX and avastin  with cycle 2. Due to neuropathy Oxaliplatin  was stopped after 09/24/19.  --For convenience she was switched to oral chemo Xeloda  2 weeks on/1 week off and Bev every 3 weeks in 01/21/20. She is tolerating well -Her restaging CT scan from 03/11/2023 showed stable liver metastasis, no other new lesions.  -CT 09/29/2023 showed mild disease progression in liver  -treatment changed to FOLFIRI and beva on 10/27/2023  Assessment & Plan Metastatic colon cancer Metastatic colon cancer with ongoing chemotherapy. Blood counts are stable, and she is tolerating the regimen well. No significant side effects except for soreness from the shot. No recent administration of the shot due to stable blood counts. - Continue current chemotherapy regimen - Perform CT scan on January 26, 2024, to evaluate cancer status - Administer shot if white blood cell counts drop significantly  Rectal bleeding Single episode of rectal  bleeding, possibly related to hemorrhoids. No associated pain, recurrent bleeding, or blood in the colostomy bag. No other bleeding symptoms such as epistaxis or gum bleeding. The bleeding resolved the following day. - Monitor for any recurrent rectal bleeding - Evaluate further if bleeding recurs   Plan - She is clinically doing well, lab reviewed, adequate for treatment, will proceed to chemo today - He is scheduled for restaging CT scan next week - Follow-up in 2 weeks before next cycle of chemo and will review CT scan images.  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 Bowel-gas pattern consistent with small bowel obstruction. No free air. 2. No acute chest findings.   01/23/2019 Imaging   CT AP IMPRESSION: Obstructing mid transverse colonic mass with mild regional adenopathy and hepatic metastatic disease. The mass likely extends through the serosa; no ascites or peritoneal nodularity.   01/24/2019 Surgery   Surgeon: [name] Signe MD Assistant: Harlene Ferraris PA-C Procedure performed: Transverse colectomy with end colostomy, liver biopsy Procedure classification: URGENT/EMERGENT Preop diagnosis: Obstructing, metastatic transverse colon mass Post-op diagnosis/intraop findings: Same   01/24/2019 Pathology Results   FINAL MICROSCOPIC DIAGNOSIS:   A. COLON, TRANSVERSE, RESECTION:  Colonic adenocarcinoma, 5 cm.  Carcinoma extends into pericolonic connective tissue and focally to  serosal surface.  Margins not involved.  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   B. LIVER  NODULE, LEFT, BIOPSY:  Metastatic  adenocarcinoma.    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 Hypermetabolic metastatic disease in the liver and mediastinal/hilar/axillary lymph nodes. 2. Focal hypermetabolism in the rectum. Continued attention on follow-up exams is warranted. 3. Focal hypermetabolism medial to the right adrenal gland may be within a metastatic lymph node, better visualized on 01/23/2019. 4. Aortic atherosclerosis (ICD10-170.0). Coronary artery calcification.   03/12/2019 -  Chemotherapy   She started 5FU q2weeks on 03/12/19 for 2 cycles. She started full dose FOLFOX with Avastin  on 04/09/19. Oxaliplatin  dose reduced repeatedly due to neuropathy C12 and held since C16 on 10/09/19. Now on maintenance Avastin  and 5FU q2weeks since 10/09/19       -Maintenance change to maintenance xeloda  2000 mg BID days 1-14 q21 days and q3 weeks Zirabev  (15 mg/kg) starting 01/21/20. First cycle was taken 1000mg  BID due to misunderstanding.    05/31/2019 Imaging   Restaging CT CAP IMPRESSION: 1. Similar to mild interval decrease in size of multiple hepatic lesions, partially calcified. 2. 2 mm right upper lobe pulmonary nodule. Recommend attention on follow-up. 3. Emphysema and aortic atherosclerosis.   08/23/2019 Imaging   CT CAP w contrast  IMPRESSION: 1. The dominant peripheral right liver metastasis has mildly increased. Other smaller liver metastases are stable. 2. Otherwise no new or progressive metastatic disease in the chest, abdomen or pelvis. 3. Aortic Atherosclerosis (ICD10-I70.0) and Emphysema (ICD10-J43.9).   11/27/2019 Imaging   CT CAP w contrast  IMPRESSION: Status post transverse colectomy with right mid abdominal colostomy.   Mildly  progressive hepatic metastases, as above.   No evidence of metastatic disease in the chest. Small mediastinal lymph nodes are within normal limits.   Additional stable ancillary findings as above.   01/21/2020 - 10/06/2023 Chemotherapy   Patient is on Treatment Plan : COLORECTAL Bevacizumab  q21d     02/21/2020 Imaging   IMPRESSION: 1. Stable hepatic metastatic disease. 2. Aortic atherosclerosis (ICD10-I70.0). Coronary artery calcification. 3.  Emphysema (ICD10-J43.9).   05/19/2020 Imaging   CT CAP  IMPRESSION: 1. Multiple partially calcified liver metastases are again noted. With the exception of a small lesion in segment 7/8 lesions are not significantly changed in the interval. No new liver lesions identified. 2. Coronary artery atherosclerotic calcifications. 3. Aortic atherosclerosis. 4. 2 mm right upper lobe lung nodule identified.  Unchanged.   Aortic Atherosclerosis (ICD10-I70.0).   11/17/2020 Imaging   CT CAP  IMPRESSION: 1. Partially calcified lesions throughout the liver are stable accounting for differences in technique, contrasted imaging on today's study is compared to noncontrast imaging on the prior. 2. No new hepatic lesions. 3. Tiny 3 mm pulmonary nodule in the RIGHT upper lobe unchanged since the prior study. Attention on follow-up. 4. Mild fullness of RIGHT paratracheal nodal tissue is minimally increased and borderline enlarged, attention on follow-up. 5. RIGHT lower quadrant colostomy. 6. 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 7. Aortic atherosclerosis.   06/09/2021 Imaging   EXAM: CT CHEST, ABDOMEN, AND PELVIS WITH CONTRAST  IMPRESSION: Stable calcified liver metastases.   Stable mildly enlarged right paratracheal lymph node.   No evidence of  new or progressive metastatic disease.   Aortic Atherosclerosis (ICD10-I70.0).    09/14/2021 PET scan   IMPRESSION: Multiple hypermetabolic and partially calcified liver metastases, without significant change compared to most recent CT.   Mild hypermetabolic mediastinal and bilateral hilar lymphadenopathy, also without significant change.   No evidence of new or progressive metastatic disease.     Electronically Signed   By: Norleen DELENA Kil M.D.   On: 09/15/2021 11:04   04/15/2022 Imaging    IMPRESSION: 1. Several previously noted partially calcified hepatic metastases appear grossly stable compared to the prior examination. No new hepatic lesions or other signs of metastatic disease noted elsewhere in the chest, abdomen or pelvis. 2. Aortic atherosclerosis, in addition to left main and three-vessel coronary artery disease. Please note that although the presence of coronary artery calcium  documents the presence of coronary artery disease, the severity of this disease and any potential stenosis cannot be assessed on this non-gated CT examination. Assessment for potential risk factor modification, dietary therapy or pharmacologic therapy may be warranted, if clinically indicated. 3. There are calcifications of the mitral annulus. Echocardiographic correlation for evaluation of potential valvular dysfunction may be warranted if clinically indicated. 4. Additional incidental findings, similar to prior studies, as above.   07/15/2022 Imaging    IMPRESSION: 1. Stable exam. No new or progressive findings in the abdomen or pelvis to suggest worsening disease. 2. No substantial change in calcified liver metastases. 3. Right abdominal transverse end colostomy with parastomal herniation of fat and colon proximal to the stoma. 4. Probable left-sided uterine fibroid measuring on the order of 3.1 cm and displacing the endometrial stripe to the right. Pelvic ultrasound could be used to further evaluate as clinically warranted. 5.  Aortic Atherosclerosis  (ICD10-I70.0).     07/07/2023 Imaging   CT chest, abdomen, and pelvis IMPRESSION: Partially calcified treated hepatic metastatic disease remains stable, except for mild increase in size of a single small low-attenuation lesion in central left hepatic lobe suspicious for enlarging metastasis. Recommend correlation with tumor markers, and consider short-term follow-up by CT in 3-6 months.   No other sites of metastatic disease identified.   Stable uterine fibroid.     10/27/2023 -  Chemotherapy   Patient is on Treatment Plan : COLORECTAL FOLFIRI + Bevacizumab  q14d        Discussed the use of AI scribe software for clinical note transcription with the patient, who gave verbal consent to proceed.  History of Present Illness Casey Black is a 63 year old female with metastatic colon cancer who presents for follow-up.  She experiences rectal bleeding, noticed after returning home from work, which resolved by the next morning. There is no associated pain, and no blood in her colostomy bag. The bleeding is not vaginal, and there is no other bleeding such as epistaxis or gum bleeding.  She is undergoing treatment for metastatic colon cancer with no issues from recent treatments. Her blood counts remain stable, and she has not experienced significant fatigue or other side effects. A CT scan is scheduled for January 26, 2024, to monitor her condition.     All other systems were reviewed with the patient and are negative.  MEDICAL HISTORY:  Past Medical History:  Diagnosis Date   Anemia    low iron   Colon cancer (HCC) 01/2019   Hypertension 01/23/2019   Personal history of chemotherapy 01/2019   colon CA   SBO (small bowel obstruction) (HCC) 01/23/2019    SURGICAL HISTORY: Past Surgical  History:  Procedure Laterality Date   CESAREAN SECTION     x2   COLOSTOMY N/A 01/24/2019   Procedure: End Loop Colostomy;  Surgeon: [name], MD;  Location: [facility] OR;  Service: General;   Laterality: N/A;   PARTIAL COLECTOMY N/A 01/24/2019   Procedure: PARTIAL COLECTOMY;  Surgeon: [name], MD;  Location: [facility] OR;  Service: General;  Laterality: N/A;   PORTACATH PLACEMENT Right 02/28/2019   Procedure: INSERTION PORT-A-CATH WITH ULTRASOUND GUIDANCE;  Surgeon: [name], MD;  Location: [facility] OR;  Service: General;  Laterality: Right;    I have reviewed the social history and family history with the patient and they are unchanged from previous note.  ALLERGIES:  has no known allergies.  MEDICATIONS:  Current Outpatient Medications  Medication Sig Dispense Refill   acetaminophen  (TYLENOL ) 325 MG tablet Take 2 tablets (650 mg total) by mouth every 6 (six) hours as needed.     amLODipine  (NORVASC ) 10 MG tablet TAKE 1 TABLET BY MOUTH EVERY DAY 90 tablet 1   benzonatate  (TESSALON ) 100 MG capsule Take 1 capsule (100 mg total) by mouth 2 (two) times daily as needed for cough. 20 capsule 0   dexamethasone  (DECADRON ) 4 MG tablet Take 2 tablets (8 mg total) by mouth daily. Start the day after chemotherapy for 2 days. Take with food. 8 tablet 5   ferrous sulfate  325 (65 FE) MG tablet TAKE 1 TABLET BY MOUTH TWICE A DAY WITH FOOD 180 tablet 1   gabapentin  (NEURONTIN ) 300 MG capsule TAKE 1 CAPSULE BY MOUTH TWICE A DAY 60 capsule 3   hydrALAZINE  (APRESOLINE ) 25 MG tablet TAKE 1 TABLET BY MOUTH EVERY 8 HOURS 270 tablet 0   hydrochlorothiazide  (HYDRODIURIL ) 12.5 MG tablet TAKE 1 TABLET BY MOUTH EVERY DAY 90 tablet 1   hydrocortisone  (ANUSOL -HC) 2.5 % rectal cream Place 1 application rectally 2 (two) times daily. 30 g 0   Ibuprofen  200 MG CAPS Take 400 mg by mouth daily as needed (pain).     isosorbide  mononitrate (IMDUR ) 30 MG 24 hr tablet TAKE 1 TABLET BY MOUTH EVERY DAY 90 tablet 1   levothyroxine  (SYNTHROID ) 25 MCG tablet TAKE 1 TABLET BY MOUTH EVERY DAY 90 tablet 1   lidocaine  (LMX) 4 % cream Apply 1 application topically 3 (three) times daily as needed. 30 g 0    lidocaine -prilocaine  (EMLA ) cream APPLY 1 APPLICATION TOPICALLY AS NEEDED. 30 g 1   lidocaine -prilocaine  (EMLA ) cream Apply to affected area once 30 g 3   lisinopril  (ZESTRIL ) 40 MG tablet TAKE 1 TABLET BY MOUTH EVERY DAY 90 tablet 1   loperamide  (IMODIUM ) 2 MG capsule Take 2 tabs by mouth with first loose stool, then 1 tab with each additional loose stool as needed. Do not exceed 8 tabs in a 24-hour period 60 capsule 3   ondansetron  (ZOFRAN ) 8 MG tablet Take 1 tablet (8 mg total) by mouth every 8 (eight) hours as needed for nausea or vomiting. 20 tablet 2   ondansetron  (ZOFRAN ) 8 MG tablet Take 1 tablet (8 mg total) by mouth every 8 (eight) hours as needed for nausea, vomiting or refractory nausea / vomiting. Start on the third day after chemotherapy. 30 tablet 1   polycarbophil (FIBERCON) 625 MG tablet Take 1 tablet (625 mg total) by mouth daily. 30 tablet 0   prochlorperazine  (COMPAZINE ) 10 MG tablet Take 1 tablet (10 mg total) by mouth every 6 (six) hours as needed for nausea or vomiting. 30 tablet 2  prochlorperazine  (COMPAZINE ) 10 MG tablet Take 1 tablet (10 mg total) by mouth every 6 (six) hours as needed for nausea or vomiting. 30 tablet 1   No current facility-administered medications for this visit.   Facility-Administered Medications Ordered in Other Visits  Medication Dose Route Frequency Provider Last Rate Last Admin   0.9 %  sodium chloride  infusion   Intravenous Continuous [name], MD 10 mL/hr at 01/19/24 1240 New Bag at 01/19/24 1240   atropine  injection 0.5 mg  0.5 mg Intravenous Once PRN [name], MD       bevacizumab -awwb (MVASI ) 500 mg in sodium chloride  0.9 % 100 mL chemo infusion  5 mg/kg (Treatment Plan Recorded) Intravenous Once [name], MD       fluorouracil  (ADRUCIL ) 5,000 mg in sodium chloride  0.9 % 150 mL chemo infusion  2,200 mg/m2 (Treatment Plan Recorded) Intravenous 1 day or 1 dose [name], MD       irinotecan  (CAMPTOSAR ) 400 mg in sodium chloride  0.9 % 500  mL chemo infusion  180 mg/m2 (Treatment Plan Recorded) Intravenous Once [name], MD       leucovorin  912 mg in sodium chloride  0.9 % 250 mL infusion  400 mg/m2 (Treatment Plan Recorded) Intravenous Once [name], MD       sodium chloride  flush (NS) 0.9 % injection 10 mL  10 mL Intracatheter PRN [name], MD   10 mL at 06/06/20 1051   sodium chloride  flush (NS) 0.9 % injection 10 mL  10 mL Intracatheter PRN [name], MD        PHYSICAL EXAMINATION: ECOG PERFORMANCE STATUS: 0 - Asymptomatic  Vitals:   01/19/24 1158  BP: 134/64  Pulse: 70  Resp: 17  Temp: (!) 97.3 F (36.3 C)  SpO2: 100%   Wt Readings from Last 3 Encounters:  01/19/24 241 lb 1.6 oz (109.4 kg)  01/05/24 237 lb 9.6 oz (107.8 kg)  12/22/23 241 lb 11.2 oz (109.6 kg)     GENERAL:alert, no distress and comfortable SKIN: skin color, texture, turgor are normal, no rashes or significant lesions EYES: normal, Conjunctiva are pink and non-injected, sclera clear NECK: supple, thyroid  normal size, non-tender, without nodularity LYMPH:  no palpable lymphadenopathy in the cervical, axillary  LUNGS: clear to auscultation and percussion with normal breathing effort HEART: regular rate & rhythm and no murmurs and no lower extremity edema ABDOMEN:abdomen soft, non-tender and normal bowel sounds Musculoskeletal:no cyanosis of digits and no clubbing  NEURO: alert & oriented x 3 with fluent speech, no focal motor/sensory deficits  Physical Exam CARDIOVASCULAR: Heart normal on auscultation  LABORATORY DATA:  I have reviewed the data as listed    Latest Ref Rng & Units 01/19/2024   11:34 AM 01/05/2024   12:38 PM 12/22/2023    9:41 AM  CBC  WBC 4.0 - 10.5 K/uL 5.2  13.2  11.5   Hemoglobin 12.0 - 15.0 g/dL 89.1  88.2  88.3   Hematocrit 36.0 - 46.0 % 33.2  36.1  36.3   Platelets 150 - 400 K/uL 187  236  168         Latest Ref Rng & Units 01/19/2024   11:34 AM 01/05/2024   12:38 PM 12/22/2023    9:41 AM  CMP  Glucose 70  - 99 mg/dL 99  82  897   BUN 8 - 23 mg/dL 17  17  16    Creatinine 0.44 - 1.00 mg/dL 9.04  9.03  9.01   Sodium 135 -  145 mmol/L 137  136  140   Potassium 3.5 - 5.1 mmol/L 4.0  3.9  4.4   Chloride 98 - 111 mmol/L 107  105  108   CO2 22 - 32 mmol/L 27  25  28    Calcium  8.9 - 10.3 mg/dL 9.9  89.9  9.6   Total Protein 6.5 - 8.1 g/dL 7.0  8.0  7.3   Total Bilirubin 0.0 - 1.2 mg/dL 0.4  0.3  0.3   Alkaline Phos 38 - 126 U/L 115  154  156   AST 15 - 41 U/L 18  19  18    ALT 0 - 44 U/L 13  13  12        RADIOGRAPHIC STUDIES: I have personally reviewed the radiological images as listed and agreed with the findings in the report. No results found.    No orders of the defined types were placed in this encounter.  All questions were answered. The patient knows to call the clinic with any problems, questions or concerns. No barriers to learning was detected. The total time spent in the appointment was 25 minutes, including review of chart and various tests results, discussions about plan of care and coordination of care plan     Onita Mattock, MD 01/19/2024

## 2024-01-21 ENCOUNTER — Inpatient Hospital Stay

## 2024-01-21 VITALS — BP 141/60 | HR 51 | Resp 20

## 2024-01-21 DIAGNOSIS — C189 Malignant neoplasm of colon, unspecified: Secondary | ICD-10-CM

## 2024-01-21 MED ORDER — SODIUM CHLORIDE 0.9% FLUSH
10.0000 mL | INTRAVENOUS | Status: DC | PRN
  Administered 2024-01-21: 10 mL

## 2024-01-21 NOTE — Progress Notes (Signed)
 No symptoms to report per patient with bradycardia. Gave her some recent VS to report to her PCP in 2 days at her scheduled visit.

## 2024-01-23 ENCOUNTER — Encounter: Payer: Self-pay | Admitting: Internal Medicine

## 2024-01-23 ENCOUNTER — Ambulatory Visit: Admitting: Internal Medicine

## 2024-01-23 VITALS — BP 130/53 | HR 60 | Temp 98.1°F | Ht 67.5 in | Wt 236.2 lb

## 2024-01-23 DIAGNOSIS — Z0000 Encounter for general adult medical examination without abnormal findings: Secondary | ICD-10-CM | POA: Diagnosis not present

## 2024-01-23 DIAGNOSIS — E039 Hypothyroidism, unspecified: Secondary | ICD-10-CM

## 2024-01-23 DIAGNOSIS — R7989 Other specified abnormal findings of blood chemistry: Secondary | ICD-10-CM

## 2024-01-23 DIAGNOSIS — C189 Malignant neoplasm of colon, unspecified: Secondary | ICD-10-CM | POA: Diagnosis not present

## 2024-01-23 DIAGNOSIS — C787 Secondary malignant neoplasm of liver and intrahepatic bile duct: Secondary | ICD-10-CM | POA: Diagnosis not present

## 2024-01-23 DIAGNOSIS — Z23 Encounter for immunization: Secondary | ICD-10-CM

## 2024-01-23 DIAGNOSIS — E559 Vitamin D deficiency, unspecified: Secondary | ICD-10-CM | POA: Diagnosis not present

## 2024-01-23 DIAGNOSIS — I10 Essential (primary) hypertension: Secondary | ICD-10-CM

## 2024-01-23 LAB — LIPID PANEL
Cholesterol: 164 mg/dL (ref 0–200)
HDL: 70.7 mg/dL (ref >39.00)
LDL Cholesterol: 73 mg/dL (ref 0–99)
NonHDL: 93.42
Total CHOL/HDL Ratio: 2
Triglycerides: 101 mg/dL (ref 0.0–149.0)
VLDL: 20.2 mg/dL (ref 0.0–40.0)

## 2024-01-23 LAB — CBC WITH DIFFERENTIAL/PLATELET
Basophils Absolute: 0 K/uL (ref 0.0–0.1)
Basophils Relative: 0.8 % (ref 0.0–3.0)
Eosinophils Absolute: 0.4 K/uL (ref 0.0–0.7)
Eosinophils Relative: 13 % — ABNORMAL HIGH (ref 0.0–5.0)
HCT: 35.8 % — ABNORMAL LOW (ref 36.0–46.0)
Hemoglobin: 11.7 g/dL — ABNORMAL LOW (ref 12.0–15.0)
Lymphocytes Relative: 42.3 % (ref 12.0–46.0)
Lymphs Abs: 1.4 K/uL (ref 0.7–4.0)
MCHC: 32.6 g/dL (ref 30.0–36.0)
MCV: 94.4 fl (ref 78.0–100.0)
Monocytes Absolute: 0 K/uL — ABNORMAL LOW (ref 0.1–1.0)
Monocytes Relative: 1.4 % — ABNORMAL LOW (ref 3.0–12.0)
Neutro Abs: 1.4 K/uL (ref 1.4–7.7)
Neutrophils Relative %: 42.5 % — ABNORMAL LOW (ref 43.0–77.0)
Platelets: 204 K/uL (ref 150.0–400.0)
RBC: 3.8 Mil/uL — ABNORMAL LOW (ref 3.87–5.11)
RDW: 15.1 % (ref 11.5–15.5)
WBC: 3.3 K/uL — ABNORMAL LOW (ref 4.0–10.5)

## 2024-01-23 LAB — COMPREHENSIVE METABOLIC PANEL WITH GFR
ALT: 20 U/L (ref 0–35)
AST: 20 U/L (ref 0–37)
Albumin: 4.1 g/dL (ref 3.5–5.2)
Alkaline Phosphatase: 109 U/L (ref 39–117)
BUN: 31 mg/dL — ABNORMAL HIGH (ref 6–23)
CO2: 26 meq/L (ref 19–32)
Calcium: 9.9 mg/dL (ref 8.4–10.5)
Chloride: 102 meq/L (ref 96–112)
Creatinine, Ser: 1.08 mg/dL (ref 0.40–1.20)
GFR: 54.54 mL/min — ABNORMAL LOW (ref >60.00)
Glucose, Bld: 82 mg/dL (ref 70–99)
Potassium: 4.3 meq/L (ref 3.5–5.1)
Sodium: 136 meq/L (ref 135–145)
Total Bilirubin: 0.6 mg/dL (ref 0.2–1.2)
Total Protein: 7.5 g/dL (ref 6.0–8.3)

## 2024-01-23 LAB — TSH: TSH: 8.07 u[IU]/mL — ABNORMAL HIGH (ref 0.35–5.50)

## 2024-01-23 LAB — VITAMIN D 25 HYDROXY (VIT D DEFICIENCY, FRACTURES): VITD: 27.38 ng/mL — ABNORMAL LOW (ref 30.00–100.00)

## 2024-01-23 LAB — VITAMIN B12: Vitamin B-12: >1500 pg/mL — ABNORMAL HIGH (ref 211–911)

## 2024-01-23 NOTE — Progress Notes (Signed)
 Established Patient Office Visit     CC/Reason for Visit: Annual preventive exam  HPI: Casey Black is a 63 y.o. female who is coming in today for the above mentioned reasons. 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  Is now back on chemotherapy on a FOLFIRI and bevacizumab  regimen.  She is feeling well.  Has routine eye and dental care.  Is due for flu, pneumonia, COVID vaccinations.  She is due for her mammogram and will schedule.   Past Medical/Surgical History: Past Medical History:  Diagnosis Date   Anemia    low iron   Colon cancer (HCC) 01/2019   Hypertension 01/23/2019   Personal history of chemotherapy 01/2019   colon CA   SBO (small bowel obstruction) (HCC) 01/23/2019    Past Surgical History:  Procedure Laterality Date   CESAREAN SECTION     x2   COLOSTOMY N/A 01/24/2019   Procedure: End Loop Colostomy;  Surgeon: [name], MD;  Location: [facility] OR;  Service: General;  Laterality: N/A;   PARTIAL COLECTOMY N/A 01/24/2019   Procedure: PARTIAL COLECTOMY;  Surgeon: [name], MD;  Location: [facility] OR;  Service: General;  Laterality: N/A;   PORTACATH PLACEMENT Right 02/28/2019   Procedure: INSERTION PORT-A-CATH WITH ULTRASOUND GUIDANCE;  Surgeon: [name], MD;  Location: [facility] OR;  Service: General;  Laterality: Right;    Social History:  reports that she quit smoking about 5 years ago. Her smoking use included cigarettes. She started smoking about 34 years ago. She has a 14.5 pack-year smoking history. She has never used smokeless tobacco. She reports that she does not currently use alcohol. She reports that she does not use drugs.  Allergies: No Known Allergies  Family History:  Family History  Problem Relation Age of Onset   Heart murmur Mother    Kidney failure Father    Hypertension Father    Hypertension Sister      Current Outpatient  Medications:    acetaminophen  (TYLENOL ) 325 MG tablet, Take 2 tablets (650 mg total) by mouth every 6 (six) hours as needed., Disp: , Rfl:    amLODipine  (NORVASC ) 10 MG tablet, TAKE 1 TABLET BY MOUTH EVERY DAY, Disp: 90 tablet, Rfl: 1   dexamethasone  (DECADRON ) 4 MG tablet, Take 2 tablets (8 mg total) by mouth daily. Start the day after chemotherapy for 2 days. Take with food., Disp: 8 tablet, Rfl: 5   ferrous sulfate  325 (65 FE) MG tablet, TAKE 1 TABLET BY MOUTH TWICE A DAY WITH FOOD, Disp: 180 tablet, Rfl: 1   gabapentin  (NEURONTIN ) 300 MG capsule, TAKE 1 CAPSULE BY MOUTH TWICE A DAY, Disp: 60 capsule, Rfl: 3   hydrALAZINE  (APRESOLINE ) 25 MG tablet, TAKE 1 TABLET BY MOUTH EVERY 8 HOURS, Disp: 270 tablet, Rfl: 0   hydrochlorothiazide  (HYDRODIURIL ) 12.5 MG tablet, TAKE 1 TABLET BY MOUTH EVERY DAY, Disp: 90 tablet, Rfl: 1   hydrocortisone  (ANUSOL -HC) 2.5 % rectal cream, Place 1 application rectally 2 (two) times daily., Disp: 30 g, Rfl: 0   Ibuprofen  200 MG CAPS, Take 400 mg by mouth daily as needed (pain)., Disp: , Rfl:    isosorbide  mononitrate (IMDUR ) 30 MG 24 hr tablet, TAKE 1 TABLET BY MOUTH EVERY DAY, Disp: 90 tablet, Rfl: 1   levothyroxine  (SYNTHROID ) 25 MCG tablet, TAKE 1 TABLET BY MOUTH EVERY DAY, Disp: 90 tablet, Rfl: 1   lidocaine  (LMX) 4 % cream, Apply 1  application topically 3 (three) times daily as needed., Disp: 30 g, Rfl: 0   lidocaine -prilocaine  (EMLA ) cream, APPLY 1 APPLICATION TOPICALLY AS NEEDED., Disp: 30 g, Rfl: 1   lidocaine -prilocaine  (EMLA ) cream, Apply to affected area once, Disp: 30 g, Rfl: 3   lisinopril  (ZESTRIL ) 40 MG tablet, TAKE 1 TABLET BY MOUTH EVERY DAY, Disp: 90 tablet, Rfl: 1   loperamide  (IMODIUM ) 2 MG capsule, Take 2 tabs by mouth with first loose stool, then 1 tab with each additional loose stool as needed. Do not exceed 8 tabs in a 24-hour period, Disp: 60 capsule, Rfl: 3   ondansetron  (ZOFRAN ) 8 MG tablet, Take 1 tablet (8 mg total) by mouth every 8 (eight)  hours as needed for nausea or vomiting., Disp: 20 tablet, Rfl: 2   ondansetron  (ZOFRAN ) 8 MG tablet, Take 1 tablet (8 mg total) by mouth every 8 (eight) hours as needed for nausea, vomiting or refractory nausea / vomiting. Start on the third day after chemotherapy., Disp: 30 tablet, Rfl: 1   polycarbophil (FIBERCON) 625 MG tablet, Take 1 tablet (625 mg total) by mouth daily., Disp: 30 tablet, Rfl: 0   prochlorperazine  (COMPAZINE ) 10 MG tablet, Take 1 tablet (10 mg total) by mouth every 6 (six) hours as needed for nausea or vomiting., Disp: 30 tablet, Rfl: 2   prochlorperazine  (COMPAZINE ) 10 MG tablet, Take 1 tablet (10 mg total) by mouth every 6 (six) hours as needed for nausea or vomiting., Disp: 30 tablet, Rfl: 1 No current facility-administered medications for this visit.  Facility-Administered Medications Ordered in Other Visits:    sodium chloride  flush (NS) 0.9 % injection 10 mL, 10 mL, Intracatheter, PRN, [name], MD, 10 mL at 06/06/20 1051  Review of Systems:  Negative unless indicated in HPI.   Physical Exam: Vitals:   01/23/24 1300 01/23/24 1305  BP: (!) 160/70 (!) 130/53  Pulse: 60   Temp: 98.1 F (36.7 C)   TempSrc: Oral   SpO2: 99%   Weight: 236 lb 3.2 oz (107.1 kg)   Height: 5' 7.5 (1.715 m)     Body mass index is 36.45 kg/m.   Physical Exam Vitals reviewed.  Constitutional:      General: She is not in acute distress.    Appearance: Normal appearance. She is not ill-appearing, toxic-appearing or diaphoretic.  HENT:     Head: Normocephalic.     Right Ear: Tympanic membrane, ear canal and external ear normal. There is no impacted cerumen.     Left Ear: Tympanic membrane, ear canal and external ear normal. There is no impacted cerumen.     Nose: Nose normal.     Mouth/Throat:     Mouth: Mucous membranes are moist.     Pharynx: Oropharynx is clear. No oropharyngeal exudate or posterior oropharyngeal erythema.  Eyes:     General: No scleral icterus.        Right eye: No discharge.        Left eye: No discharge.     Conjunctiva/sclera: Conjunctivae normal.     Pupils: Pupils are equal, round, and reactive to light.  Neck:     Vascular: No carotid bruit.  Cardiovascular:     Rate and Rhythm: Normal rate and regular rhythm.     Pulses: Normal pulses.     Heart sounds: Normal heart sounds.  Pulmonary:     Effort: Pulmonary effort is normal. No respiratory distress.     Breath sounds: Normal breath sounds.  Abdominal:  General: Abdomen is flat. Bowel sounds are normal.     Palpations: Abdomen is soft.  Musculoskeletal:        General: Normal range of motion.     Cervical back: Normal range of motion.  Skin:    General: Skin is warm and dry.  Neurological:     General: No focal deficit present.     Mental Status: She is alert and oriented to person, place, and time. Mental status is at baseline.  Psychiatric:        Mood and Affect: Mood normal.        Behavior: Behavior normal.        Thought Content: Thought content normal.        Judgment: Judgment normal.      Impression and Plan:  Vitamin D  deficiency -     VITAMIN D  25 Hydroxy (Vit-D Deficiency, Fractures); Future  Acquired hypothyroidism -     TSH; Future  Essential hypertension -     CBC with Differential/Platelet; Future -     Comprehensive metabolic panel with GFR; Future -     Lipid panel; Future -     Vitamin B12; Future  Encounter for preventive health examination  Elevated TSH  Adenocarcinoma of colon metastatic to liver [facility])  Immunization due  -Recommend routine eye and dental care. -Healthy lifestyle discussed in detail. -Labs to be updated today. -Prostate cancer screening: Not applicable Health Maintenance  Topic Date Due   Pneumococcal Vaccine for age over 56 (1 of 2 - PCV) Never done   COVID-19 Vaccine (3 - Pfizer risk series) 12/06/2019   Medicare Annual Wellness Visit  02/01/2024   Flu Shot  01/03/2025*   Breast Cancer Screening   11/07/2024   Pap with HPV screening  01/28/2025   DTaP/Tdap/Td vaccine (2 - Td or Tdap) 05/08/2031   Hepatitis C Screening  Completed   HIV Screening  Completed   Hepatitis B Vaccine  Aged Out   HPV Vaccine  Aged Out   Meningitis B Vaccine  Aged Out   Colon Cancer Screening  Discontinued   Zoster (Shingles) Vaccine  Discontinued  *Topic was postponed. The date shown is not the original due date.     -Flu vaccine administered today.  She will obtain COVID and pneumonia at a later date. - Blood pressure is elevated, likely on account of steroid she is receiving with her chemotherapy. - She will call to schedule her mammogram.    Tully [name], MD [facility]

## 2024-01-23 NOTE — Addendum Note (Signed)
 Addended by: KATHRYNE MILLMAN B on: 01/23/2024 04:48 PM   Modules accepted: Orders

## 2024-01-24 ENCOUNTER — Ambulatory Visit: Payer: Self-pay | Admitting: Internal Medicine

## 2024-01-24 DIAGNOSIS — E559 Vitamin D deficiency, unspecified: Secondary | ICD-10-CM

## 2024-01-24 DIAGNOSIS — E039 Hypothyroidism, unspecified: Secondary | ICD-10-CM

## 2024-01-24 MED ORDER — VITAMIN D (ERGOCALCIFEROL) 1.25 MG (50000 UNIT) PO CAPS: 50000.0000 [IU] | ORAL_CAPSULE | ORAL | 0 refills | Status: AC

## 2024-01-24 MED ORDER — LEVOTHYROXINE SODIUM 50 MCG PO TABS: 50.0000 ug | ORAL_TABLET | Freq: Every day | ORAL | 0 refills | Status: DC

## 2024-01-25 ENCOUNTER — Other Ambulatory Visit: Payer: Self-pay | Admitting: Internal Medicine

## 2024-01-25 DIAGNOSIS — Z1231 Encounter for screening mammogram for malignant neoplasm of breast: Secondary | ICD-10-CM

## 2024-01-26 ENCOUNTER — Ambulatory Visit (HOSPITAL_COMMUNITY)
Admission: RE | Admit: 2024-01-26 | Discharge: 2024-01-26 | Disposition: A | Discharge status: Home / Self Care | Source: Ambulatory Visit | Attending: Hematology | Admitting: Hematology

## 2024-01-26 DIAGNOSIS — C787 Secondary malignant neoplasm of liver and intrahepatic bile duct: Secondary | ICD-10-CM | POA: Diagnosis not present

## 2024-01-26 DIAGNOSIS — R160 Hepatomegaly, not elsewhere classified: Secondary | ICD-10-CM | POA: Diagnosis not present

## 2024-01-26 DIAGNOSIS — C189 Malignant neoplasm of colon, unspecified: Secondary | ICD-10-CM | POA: Diagnosis not present

## 2024-01-26 DIAGNOSIS — I700 Atherosclerosis of aorta: Secondary | ICD-10-CM | POA: Diagnosis not present

## 2024-01-26 MED ORDER — IOHEXOL 300 MG/ML  SOLN
100.0000 mL | Freq: Once | INTRAMUSCULAR | Status: AC | PRN
  Administered 2024-01-26: 100 mL via INTRAVENOUS

## 2024-01-26 MED ORDER — HEPARIN SOD (PORK) LOCK FLUSH 100 UNIT/ML IV SOLN
500.0000 [IU] | Freq: Once | INTRAVENOUS | Status: AC
  Administered 2024-01-26: 500 [IU] via INTRAVENOUS

## 2024-01-26 MED ORDER — IOHEXOL 9 MG/ML PO SOLN
500.0000 mL | ORAL | Status: AC
  Administered 2024-01-26 (×2): 500 mL via ORAL

## 2024-01-26 MED ORDER — IOHEXOL 9 MG/ML PO SOLN
ORAL | Status: AC
  Filled 2024-01-26: qty 1000

## 2024-01-27 DIAGNOSIS — C787 Secondary malignant neoplasm of liver and intrahepatic bile duct: Secondary | ICD-10-CM | POA: Diagnosis not present

## 2024-01-27 DIAGNOSIS — C189 Malignant neoplasm of colon, unspecified: Secondary | ICD-10-CM | POA: Diagnosis not present

## 2024-02-01 NOTE — Assessment & Plan Note (Signed)
 [id number] stage IV with liver and nodal metastasis, MSS, KRAS G12S(+)  -Diagnosed in 01/2019 after emergent colectomy and liver biopsy. 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  -Started first line chemo on 03/12/2019, received 5FU/leuc with first 2 cycles for large open abdominal wound after surgery. She started full dose FOLFOX and avastin  with cycle 2. Due to neuropathy Oxaliplatin  was stopped after 09/24/19.  --For convenience she was switched to oral chemo Xeloda  2 weeks on/1 week off and Bev every 3 weeks in 01/21/20. She is tolerating well -Her restaging CT scan from 03/11/2023 showed stable liver metastasis, no other new lesions.  -CT 09/29/2023 showed mild disease progression in liver  -treatment changed to FOLFIRI and beva on 10/27/2023

## 2024-02-02 ENCOUNTER — Other Ambulatory Visit: Payer: Self-pay

## 2024-02-02 ENCOUNTER — Encounter: Payer: Self-pay | Admitting: Hematology

## 2024-02-02 ENCOUNTER — Inpatient Hospital Stay (HOSPITAL_BASED_OUTPATIENT_CLINIC_OR_DEPARTMENT_OTHER): Admitting: Hematology

## 2024-02-02 ENCOUNTER — Inpatient Hospital Stay

## 2024-02-02 VITALS — BP 150/65 | HR 67 | Temp 97.8°F | Resp 18 | Wt 238.0 lb

## 2024-02-02 DIAGNOSIS — C189 Malignant neoplasm of colon, unspecified: Secondary | ICD-10-CM

## 2024-02-02 DIAGNOSIS — C787 Secondary malignant neoplasm of liver and intrahepatic bile duct: Secondary | ICD-10-CM | POA: Diagnosis not present

## 2024-02-02 DIAGNOSIS — Z5112 Encounter for antineoplastic immunotherapy: Secondary | ICD-10-CM | POA: Diagnosis not present

## 2024-02-02 LAB — CBC WITH DIFFERENTIAL (CANCER CENTER ONLY)
Abs Immature Granulocytes: 0.01 K/uL (ref 0.00–0.07)
Basophils Absolute: 0 K/uL (ref 0.0–0.1)
Basophils Relative: 1 %
Eosinophils Absolute: 0.3 K/uL (ref 0.0–0.5)
Eosinophils Relative: 5 %
HCT: 34.2 % — ABNORMAL LOW (ref 36.0–46.0)
Hemoglobin: 11 g/dL — ABNORMAL LOW (ref 12.0–15.0)
Immature Granulocytes: 0 %
Lymphocytes Relative: 30 %
Lymphs Abs: 1.5 K/uL (ref 0.7–4.0)
MCH: 30.2 pg (ref 26.0–34.0)
MCHC: 32.2 g/dL (ref 30.0–36.0)
MCV: 94 fL (ref 80.0–100.0)
Monocytes Absolute: 0.5 K/uL (ref 0.1–1.0)
Monocytes Relative: 10 %
Neutro Abs: 2.7 K/uL (ref 1.7–7.7)
Neutrophils Relative %: 54 %
Platelet Count: 167 K/uL (ref 150–400)
RBC: 3.64 MIL/uL — ABNORMAL LOW (ref 3.87–5.11)
RDW: 14.6 % (ref 11.5–15.5)
WBC Count: 5.1 K/uL (ref 4.0–10.5)
nRBC: 0 % (ref 0.0–0.2)

## 2024-02-02 LAB — CMP (CANCER CENTER ONLY)
ALT: 14 U/L (ref 0–44)
AST: 20 U/L (ref 15–41)
Albumin: 3.9 g/dL (ref 3.5–5.0)
Alkaline Phosphatase: 111 U/L (ref 38–126)
Anion gap: 7 (ref 5–15)
BUN: 20 mg/dL (ref 8–23)
CO2: 26 mmol/L (ref 22–32)
Calcium: 9.8 mg/dL (ref 8.9–10.3)
Chloride: 104 mmol/L (ref 98–111)
Creatinine: 1.1 mg/dL — ABNORMAL HIGH (ref 0.44–1.00)
GFR, Estimated: 56 mL/min — ABNORMAL LOW (ref >60)
Glucose, Bld: 85 mg/dL (ref 70–99)
Potassium: 4.4 mmol/L (ref 3.5–5.1)
Sodium: 137 mmol/L (ref 135–145)
Total Bilirubin: 0.4 mg/dL (ref 0.0–1.2)
Total Protein: 7.4 g/dL (ref 6.5–8.1)

## 2024-02-02 LAB — TOTAL PROTEIN, URINE DIPSTICK: Protein, ur: 30 mg/dL — AB

## 2024-02-02 LAB — CEA (ACCESS): CEA (CHCC): 395.98 ng/mL — ABNORMAL HIGH (ref 0.00–5.00)

## 2024-02-02 MED ORDER — SODIUM CHLORIDE 0.9% FLUSH: 10.0000 mL | INTRAVENOUS | Status: DC | PRN

## 2024-02-02 MED ORDER — PALONOSETRON HCL INJECTION 0.25 MG/5ML
0.2500 mg | Freq: Once | INTRAVENOUS | Status: AC
  Administered 2024-02-02: 0.25 mg via INTRAVENOUS
  Filled 2024-02-02: qty 5

## 2024-02-02 MED ORDER — SODIUM CHLORIDE 0.9 % IV SOLN: INTRAVENOUS | Status: DC

## 2024-02-02 MED ORDER — SODIUM CHLORIDE 0.9 % IV SOLN
5.0000 mg/kg | Freq: Once | INTRAVENOUS | Status: AC
  Administered 2024-02-02: 500 mg via INTRAVENOUS
  Filled 2024-02-02: qty 4

## 2024-02-02 MED ORDER — SODIUM CHLORIDE 0.9 % IV SOLN
180.0000 mg/m2 | Freq: Once | INTRAVENOUS | Status: AC
  Administered 2024-02-02: 400 mg via INTRAVENOUS
  Filled 2024-02-02: qty 15

## 2024-02-02 MED ORDER — SODIUM CHLORIDE 0.9 % IV SOLN
2200.0000 mg/m2 | INTRAVENOUS | Status: DC
  Administered 2024-02-02: 5000 mg via INTRAVENOUS
  Filled 2024-02-02: qty 100

## 2024-02-02 MED ORDER — ATROPINE SULFATE 1 MG/ML IV SOLN
0.5000 mg | Freq: Once | INTRAVENOUS | Status: AC | PRN
  Administered 2024-02-02: 0.5 mg via INTRAVENOUS
  Filled 2024-02-02: qty 1

## 2024-02-02 MED ORDER — SODIUM CHLORIDE 0.9 % IV SOLN
400.0000 mg/m2 | Freq: Once | INTRAVENOUS | Status: AC
  Administered 2024-02-02: 912 mg via INTRAVENOUS
  Filled 2024-02-02: qty 45.6

## 2024-02-02 MED ORDER — DEXAMETHASONE SOD PHOSPHATE PF 10 MG/ML IJ SOLN
10.0000 mg | Freq: Once | INTRAMUSCULAR | Status: AC
  Administered 2024-02-02: 10 mg via INTRAVENOUS

## 2024-02-02 NOTE — Patient Instructions (Signed)
 [facility] - A DEPT OF [facility]  Discharge Instructions: Thank you for choosing [facility] to provide your oncology and hematology care.   If you have a lab appointment with the Cancer Center, please go directly to the Cancer Center and check in at the registration area.   Wear comfortable clothing and clothing appropriate for easy access to any Portacath or PICC line.   We strive to give you quality time with your provider. You may need to reschedule your appointment if you arrive late (15 or more minutes).  Arriving late affects you and other patients whose appointments are after yours.  Also, if you miss three or more appointments without notifying the office, you may be dismissed from the clinic at the provider's discretion.      For prescription refill requests, have your pharmacy contact our office and allow 72 hours for refills to be completed.    Today you received the following chemotherapy and/or immunotherapy agents: Bevacizumab, Irinotecan, Leucovorin, and Fluorouracil.      To help prevent nausea and vomiting after your treatment, we encourage you to take your nausea medication as directed.  BELOW ARE SYMPTOMS THAT SHOULD BE REPORTED IMMEDIATELY: *FEVER GREATER THAN 100.4 F (38 C) OR HIGHER *CHILLS OR SWEATING *NAUSEA AND VOMITING THAT IS NOT CONTROLLED WITH YOUR NAUSEA MEDICATION *UNUSUAL SHORTNESS OF BREATH *UNUSUAL BRUISING OR BLEEDING *URINARY PROBLEMS (pain or burning when urinating, or frequent urination) *BOWEL PROBLEMS (unusual diarrhea, constipation, pain near the anus) TENDERNESS IN MOUTH AND THROAT WITH OR WITHOUT PRESENCE OF ULCERS (sore throat, sores in mouth, or a toothache) UNUSUAL RASH, SWELLING OR PAIN  UNUSUAL VAGINAL DISCHARGE OR ITCHING   Items with * indicate a potential emergency and should be followed up as soon as possible or go to the Emergency Department if any problems should occur.  Please show  the CHEMOTHERAPY ALERT CARD or IMMUNOTHERAPY ALERT CARD at check-in to the Emergency Department and triage nurse.  Should you have questions after your visit or need to cancel or reschedule your appointment, please contact [facility] - A DEPT OF [facility]  Dept: [phone]  and follow the prompts.  Office hours are 8:00 a.m. to 4:30 p.m. Monday - Friday. Please note that voicemails left after 4:00 p.m. may not be returned until the following business day.  We are closed weekends and major holidays. You have access to a nurse at all times for urgent questions. Please call the main number to the clinic Dept: [phone] and follow the prompts.   For any non-urgent questions, you may also contact your provider using MyChart. We now offer e-Visits for anyone 61 and older to request care online for non-urgent symptoms. For details visit [email or web address].   Also download the MyChart app! Go to the app store, search "MyChart", open the app, select , and log in with your MyChart username and password.  The chemotherapy medication bag should finish at 46 hours, 96 hours, or 7 days. For example, if your pump is scheduled for 46 hours and it was put on at 4:00 p.m., it should finish at 2:00 p.m. the day it is scheduled to come off regardless of your appointment time.     Estimated time to finish at: 1:30 PM   If the display on your pump reads "Low Volume" and it is beeping, take the batteries out of the pump and come to the  cancer center for it to be taken off.   If the pump alarms go off prior to the pump reading "Low Volume" then call [phone] and someone can assist you.  If the plunger comes out and the chemotherapy medication is leaking out, please use your home chemo spill kit to clean up the spill. Do NOT use paper towels or other household products.  If you have problems or questions regarding your pump, please call either [phone]  (24 hours a day) or the cancer center Monday-Friday 8:00 a.m.- 4:30 p.m. at the clinic number and we will assist you. If you are unable to get assistance, then go to the nearest Emergency Department and ask the staff to contact the IV team for assistance.

## 2024-02-02 NOTE — Progress Notes (Signed)
 Ok to speed fluorouracil  infusion up to 44 hours instead of 46 hours - in order to accommodate her infusion time.

## 2024-02-02 NOTE — Progress Notes (Signed)
 Placed an order in portal for Guardant 360 as per Dr. Lanny, uploaded paper work in portal and received confirmation of receipt. Took paperwork to lab to be drawn on next lab appointment in 2 weeks.

## 2024-02-02 NOTE — Progress Notes (Signed)
 [facility]   [phone] [phone]   Clinic Follow up Note   Patient Care Team: [name], [name], MD as PCP - General (Internal Medicine) [name], MD as PCP - Cardiology (Cardiology) [name], MD as Consulting Physician (General Surgery) [name], MD as Consulting Physician (Hematology) [name], [name], NP as Nurse Practitioner (Nurse Practitioner)  Date of Service:  02/02/2024  CHIEF COMPLAINT: f/u of metastatic colon cancer   CURRENT THERAPY:  chemotherapy FOLFIRI and bevacizumab  every 2 weeks  Oncology History   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  -Started first line chemo on 03/12/2019, received 5FU/leuc with first 2 cycles for large open abdominal wound after surgery. She started full dose FOLFOX and avastin  with cycle 2. Due to neuropathy Oxaliplatin  was stopped after 09/24/19.  --For convenience she was switched to oral chemo Xeloda  2 weeks on/1 week off and Bev every 3 weeks in 01/21/20. She is tolerating well -Her restaging CT scan from 03/11/2023 showed stable liver metastasis, no other new lesions.  -CT 09/29/2023 showed mild disease progression in liver  -treatment changed to FOLFIRI and beva on 10/27/2023  Assessment & Plan Metastatic colon cancer with hepatic involvement Overall stable disease with a 15% increase in size of a liver lesion from 2.3 cm to 2.7 cm. Other liver lesions remain stable. Tumor markers are consistent with scan findings. No new symptoms reported. Current treatment plan remains unchanged. - Will perform liquid biopsy to detect mutations in tumor cells for potential new target therapy. - Continue current treatment regimen. - Will schedule follow-up appointment in two  weeks.  Essential hypertension Blood pressure slightly elevated, possibly due to anxiety related to CT scan and treatment. Current medications include Norvasc , hydralazine , and Indur. Urine protein levels are not significantly elevated. - Continue current antihypertensive medications. - Monitor blood pressure at home.  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 Bowel-gas pattern consistent with small bowel obstruction. No free air. 2. No acute chest findings.   01/23/2019 Imaging   CT AP IMPRESSION: Obstructing mid transverse colonic mass with mild regional adenopathy and hepatic metastatic disease. The mass likely extends through the serosa; no ascites or peritoneal nodularity.   01/24/2019 Surgery   Surgeon: [name] Signe MD Assistant: Harlene Ferraris PA-C Procedure performed: Transverse colectomy with end colostomy, liver biopsy Procedure classification: URGENT/EMERGENT Preop diagnosis: Obstructing, metastatic transverse colon mass Post-op diagnosis/intraop findings: Same   01/24/2019 Pathology Results   FINAL MICROSCOPIC DIAGNOSIS:   A. COLON, TRANSVERSE, RESECTION:  Colonic adenocarcinoma, 5 cm.  Carcinoma extends into pericolonic connective tissue and focally to  serosal surface.  Margins not involved.  Metastatic carcinoma  in  one of thirteen lymph nodes (1/13).   B. LIVER NODULE, LEFT, BIOPSY:  Metastatic adenocarcinoma.    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 Hypermetabolic metastatic disease in the liver and mediastinal/hilar/axillary lymph nodes. 2. Focal hypermetabolism in the rectum. Continued attention on follow-up exams is warranted. 3. Focal hypermetabolism medial to the right adrenal gland may be within a metastatic lymph node, better visualized on 01/23/2019. 4. Aortic atherosclerosis (ICD10-170.0). Coronary artery calcification.   03/12/2019 -  Chemotherapy   She started 5FU q2weeks on 03/12/19 for 2 cycles. She started full dose FOLFOX with Avastin  on 04/09/19. Oxaliplatin  dose reduced repeatedly due to neuropathy C12 and held since C16 on 10/09/19. Now on maintenance Avastin  and 5FU q2weeks since 10/09/19       -Maintenance change to maintenance xeloda  2000 mg BID days 1-14 q21 days and q3 weeks Zirabev  (15 mg/kg) starting 01/21/20. First cycle was taken 1000mg  BID due to misunderstanding.    05/31/2019 Imaging   Restaging CT CAP IMPRESSION: 1. Similar to mild interval decrease in size of multiple hepatic lesions, partially calcified. 2. 2 mm right upper lobe pulmonary nodule. Recommend attention on follow-up. 3. Emphysema and aortic atherosclerosis.   08/23/2019 Imaging   CT CAP w contrast  IMPRESSION: 1. The dominant peripheral right liver metastasis has mildly increased. Other smaller liver metastases are stable. 2. Otherwise no new or progressive metastatic disease in the chest, abdomen or pelvis. 3. Aortic Atherosclerosis (ICD10-I70.0) and Emphysema (ICD10-J43.9).   11/27/2019 Imaging   CT CAP w contrast   IMPRESSION: Status post transverse colectomy with right mid abdominal colostomy.   Mildly progressive hepatic metastases, as above.   No evidence of metastatic disease in the chest. Small mediastinal lymph nodes are within normal limits.   Additional stable ancillary findings as above.   01/21/2020 - 10/06/2023 Chemotherapy   Patient is on Treatment Plan : COLORECTAL Bevacizumab  q21d     02/21/2020 Imaging   IMPRESSION: 1. Stable hepatic metastatic disease. 2. Aortic atherosclerosis (ICD10-I70.0). Coronary artery calcification. 3.  Emphysema (ICD10-J43.9).   05/19/2020 Imaging   CT CAP  IMPRESSION: 1. Multiple partially calcified liver metastases are again noted. With the exception of a small lesion in segment 7/8 lesions are not significantly changed in the interval. No new liver lesions identified. 2. Coronary artery atherosclerotic calcifications. 3. Aortic atherosclerosis. 4. 2 mm right upper lobe lung nodule identified.  Unchanged.   Aortic Atherosclerosis (ICD10-I70.0).   11/17/2020 Imaging   CT CAP  IMPRESSION: 1. Partially calcified lesions throughout the liver are stable accounting for differences in technique, contrasted imaging on today's study is compared to noncontrast imaging on the prior. 2. No new hepatic lesions. 3. Tiny 3 mm pulmonary nodule in the RIGHT upper lobe unchanged since the prior study. Attention on follow-up. 4. Mild fullness of RIGHT paratracheal nodal tissue is minimally increased and borderline enlarged, attention on follow-up. 5. RIGHT lower quadrant colostomy. 6. 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 7. Aortic atherosclerosis.   06/09/2021 Imaging   EXAM: CT CHEST, ABDOMEN, AND PELVIS WITH CONTRAST  IMPRESSION: Stable calcified liver metastases.   Stable mildly enlarged  right paratracheal lymph node.   No  evidence of new or progressive metastatic disease.   Aortic Atherosclerosis (ICD10-I70.0).   09/14/2021 PET scan   IMPRESSION: Multiple hypermetabolic and partially calcified liver metastases, without significant change compared to most recent CT.   Mild hypermetabolic mediastinal and bilateral hilar lymphadenopathy, also without significant change.   No evidence of new or progressive metastatic disease.     Electronically Signed   By: Norleen DELENA Kil M.D.   On: 09/15/2021 11:04   04/15/2022 Imaging    IMPRESSION: 1. Several previously noted partially calcified hepatic metastases appear grossly stable compared to the prior examination. No new hepatic lesions or other signs of metastatic disease noted elsewhere in the chest, abdomen or pelvis. 2. Aortic atherosclerosis, in addition to left main and three-vessel coronary artery disease. Please note that although the presence of coronary artery calcium  documents the presence of coronary artery disease, the severity of this disease and any potential stenosis cannot be assessed on this non-gated CT examination. Assessment for potential risk factor modification, dietary therapy or pharmacologic therapy may be warranted, if clinically indicated. 3. There are calcifications of the mitral annulus. Echocardiographic correlation for evaluation of potential valvular dysfunction may be warranted if clinically indicated. 4. Additional incidental findings, similar to prior studies, as above.   07/15/2022 Imaging    IMPRESSION: 1. Stable exam. No new or progressive findings in the abdomen or pelvis to suggest worsening disease. 2. No substantial change in calcified liver metastases. 3. Right abdominal transverse end colostomy with parastomal herniation of fat and colon proximal to the stoma. 4. Probable left-sided uterine fibroid measuring on the order of 3.1 cm and displacing the endometrial stripe to the right. Pelvic ultrasound could be  used to further evaluate as clinically warranted. 5.  Aortic Atherosclerosis (ICD10-I70.0).     07/07/2023 Imaging   CT chest, abdomen, and pelvis IMPRESSION: Partially calcified treated hepatic metastatic disease remains stable, except for mild increase in size of a single small low-attenuation lesion in central left hepatic lobe suspicious for enlarging metastasis. Recommend correlation with tumor markers, and consider short-term follow-up by CT in 3-6 months.   No other sites of metastatic disease identified.   Stable uterine fibroid.     10/27/2023 -  Chemotherapy   Patient is on Treatment Plan : COLORECTAL FOLFIRI + Bevacizumab  q14d        Discussed the use of AI scribe software for clinical note transcription with the patient, who gave verbal consent to proceed.  History of Present Illness Casey Black is a 63 year old female with metastatic colon cancer who presents for follow-up.  Recent imaging reveals a slight increase in one liver lesion from 2.3 cm to 2.7 cm, with other liver lesions remaining stable. She is asymptomatic and feels well overall.  Blood pressure is slightly elevated. She is on Norvasc , hydralazine , and Indur, and confirms adherence to her medication regimen.  Blood counts are within normal limits. Urinalysis indicates a small amount of protein. No medication refills are needed at this time.     All other systems were reviewed with the patient and are negative.  MEDICAL HISTORY:  Past Medical History:  Diagnosis Date   Anemia    low iron   Colon cancer (HCC) 01/2019   Hypertension 01/23/2019   Personal history of chemotherapy 01/2019   colon CA   SBO (small bowel obstruction) (HCC) 01/23/2019    SURGICAL HISTORY: Past Surgical History:  Procedure Laterality Date   CESAREAN SECTION  x2   COLOSTOMY N/A 01/24/2019   Procedure: End Loop Colostomy;  Surgeon: [name], MD;  Location: [facility];  Service: General;  Laterality: N/A;    PARTIAL COLECTOMY N/A 01/24/2019   Procedure: PARTIAL COLECTOMY;  Surgeon: [name], MD;  Location: [facility] OR;  Service: General;  Laterality: N/A;   PORTACATH PLACEMENT Right 02/28/2019   Procedure: INSERTION PORT-A-CATH WITH ULTRASOUND GUIDANCE;  Surgeon: [name], MD;  Location: [facility] OR;  Service: General;  Laterality: Right;    I have reviewed the social history and family history with the patient and they are unchanged from previous note.  ALLERGIES:  has no known allergies.  MEDICATIONS:  Current Outpatient Medications  Medication Sig Dispense Refill   acetaminophen  (TYLENOL ) 325 MG tablet Take 2 tablets (650 mg total) by mouth every 6 (six) hours as needed.     amLODipine  (NORVASC ) 10 MG tablet TAKE 1 TABLET BY MOUTH EVERY DAY 90 tablet 1   dexamethasone  (DECADRON ) 4 MG tablet Take 2 tablets (8 mg total) by mouth daily. Start the day after chemotherapy for 2 days. Take with food. 8 tablet 5   ferrous sulfate  325 (65 FE) MG tablet TAKE 1 TABLET BY MOUTH TWICE A DAY WITH FOOD 180 tablet 1   gabapentin  (NEURONTIN ) 300 MG capsule TAKE 1 CAPSULE BY MOUTH TWICE A DAY 60 capsule 3   hydrALAZINE  (APRESOLINE ) 25 MG tablet TAKE 1 TABLET BY MOUTH EVERY 8 HOURS 270 tablet 0   hydrochlorothiazide  (HYDRODIURIL ) 12.5 MG tablet TAKE 1 TABLET BY MOUTH EVERY DAY 90 tablet 1   hydrocortisone  (ANUSOL -HC) 2.5 % rectal cream Place 1 application rectally 2 (two) times daily. 30 g 0   Ibuprofen  200 MG CAPS Take 400 mg by mouth daily as needed (pain).     isosorbide  mononitrate (IMDUR ) 30 MG 24 hr tablet TAKE 1 TABLET BY MOUTH EVERY DAY 90 tablet 1   levothyroxine  (SYNTHROID ) 50 MCG tablet Take 1 tablet (50 mcg total) by mouth daily. 90 tablet 0   lidocaine  (LMX) 4 % cream Apply 1 application topically 3 (three) times daily as needed. 30 g 0   lidocaine -prilocaine  (EMLA ) cream APPLY 1 APPLICATION TOPICALLY AS NEEDED. 30 g 1   lidocaine -prilocaine  (EMLA ) cream Apply to affected area once 30 g 3    lisinopril  (ZESTRIL ) 40 MG tablet TAKE 1 TABLET BY MOUTH EVERY DAY 90 tablet 1   loperamide  (IMODIUM ) 2 MG capsule Take 2 tabs by mouth with first loose stool, then 1 tab with each additional loose stool as needed. Do not exceed 8 tabs in a 24-hour period 60 capsule 3   ondansetron  (ZOFRAN ) 8 MG tablet Take 1 tablet (8 mg total) by mouth every 8 (eight) hours as needed for nausea or vomiting. 20 tablet 2   ondansetron  (ZOFRAN ) 8 MG tablet Take 1 tablet (8 mg total) by mouth every 8 (eight) hours as needed for nausea, vomiting or refractory nausea / vomiting. Start on the third day after chemotherapy. 30 tablet 1   polycarbophil (FIBERCON) 625 MG tablet Take 1 tablet (625 mg total) by mouth daily. 30 tablet 0   prochlorperazine  (COMPAZINE ) 10 MG tablet Take 1 tablet (10 mg total) by mouth every 6 (six) hours as needed for nausea or vomiting. 30 tablet 2   prochlorperazine  (COMPAZINE ) 10 MG tablet Take 1 tablet (10 mg total) by mouth every 6 (six) hours as needed for nausea or vomiting. 30 tablet 1   Vitamin D , Ergocalciferol , (DRISDOL ) 1.25 MG (50000  UNIT) CAPS capsule Take 1 capsule (50,000 Units total) by mouth every 7 (seven) days for 12 doses. 12 capsule 0   No current facility-administered medications for this visit.   Facility-Administered Medications Ordered in Other Visits  Medication Dose Route Frequency Provider Last Rate Last Admin   sodium chloride  flush (NS) 0.9 % injection 10 mL  10 mL Intracatheter PRN [name], MD   10 mL at 06/06/20 1051    PHYSICAL EXAMINATION: ECOG PERFORMANCE STATUS: 1 - Symptomatic but completely ambulatory  There were no vitals filed for this visit. Wt Readings from Last 3 Encounters:  02/02/24 238 lb (108 kg)  01/23/24 236 lb 3.2 oz (107.1 kg)  01/19/24 241 lb 1.6 oz (109.4 kg)     GENERAL:alert, no distress and comfortable SKIN: skin color, texture, turgor are normal, no rashes or significant lesions EYES: normal, Conjunctiva are pink and  non-injected, sclera clear NECK: supple, thyroid  normal size, non-tender, without nodularity LYMPH:  no palpable lymphadenopathy in the cervical, axillary  LUNGS: clear to auscultation and percussion with normal breathing effort HEART: regular rate & rhythm and no murmurs and no lower extremity edema ABDOMEN:abdomen soft, non-tender and normal bowel sounds Musculoskeletal:no cyanosis of digits and no clubbing  NEURO: alert & oriented x 3 with fluent speech, no focal motor/sensory deficits  Physical Exam    LABORATORY DATA:  I have reviewed the data as listed    Latest Ref Rng & Units 02/02/2024   12:18 PM 01/23/2024    1:33 PM 01/19/2024   11:34 AM  CBC  WBC 4.0 - 10.5 K/uL 5.1  3.3  5.2   Hemoglobin 12.0 - 15.0 g/dL 88.9  88.2  89.1   Hematocrit 36.0 - 46.0 % 34.2  35.8  33.2   Platelets 150 - 400 K/uL 167  204.0  187         Latest Ref Rng & Units 01/23/2024    1:33 PM 01/19/2024   11:34 AM 01/05/2024   12:38 PM  CMP  Glucose 70 - 99 mg/dL 82  99  82   BUN 6 - 23 mg/dL 31  17  17    Creatinine 0.40 - 1.20 mg/dL 8.91  9.04  9.03   Sodium 135 - 145 mEq/L 136  137  136   Potassium 3.5 - 5.1 mEq/L 4.3  4.0  3.9   Chloride 96 - 112 mEq/L 102  107  105   CO2 19 - 32 mEq/L 26  27  25    Calcium  8.4 - 10.5 mg/dL 9.9  9.9  89.9   Total Protein 6.0 - 8.3 g/dL 7.5  7.0  8.0   Total Bilirubin 0.2 - 1.2 mg/dL 0.6  0.4  0.3   Alkaline Phos 39 - 117 U/L 109  115  154   AST 0 - 37 U/L 20  18  19    ALT 0 - 35 U/L 20  13  13        RADIOGRAPHIC STUDIES: I have personally reviewed the radiological images as listed and agreed with the findings in the report. No results found.    No orders of the defined types were placed in this encounter.  All questions were answered. The patient knows to call the clinic with any problems, questions or concerns. No barriers to learning was detected. The total time spent in the appointment was 25 minutes, including review of chart and various  tests results, discussions about plan of care and coordination of care plan  Onita Mattock, MD 02/02/2024

## 2024-02-04 ENCOUNTER — Inpatient Hospital Stay: Attending: Hematology

## 2024-02-04 DIAGNOSIS — C787 Secondary malignant neoplasm of liver and intrahepatic bile duct: Secondary | ICD-10-CM | POA: Insufficient documentation

## 2024-02-04 DIAGNOSIS — Z79899 Other long term (current) drug therapy: Secondary | ICD-10-CM | POA: Insufficient documentation

## 2024-02-04 DIAGNOSIS — C779 Secondary and unspecified malignant neoplasm of lymph node, unspecified: Secondary | ICD-10-CM | POA: Insufficient documentation

## 2024-02-04 DIAGNOSIS — C184 Malignant neoplasm of transverse colon: Secondary | ICD-10-CM | POA: Insufficient documentation

## 2024-02-04 DIAGNOSIS — Z5111 Encounter for antineoplastic chemotherapy: Secondary | ICD-10-CM | POA: Insufficient documentation

## 2024-02-04 NOTE — Patient Instructions (Signed)

## 2024-02-09 ENCOUNTER — Telehealth: Payer: Self-pay | Admitting: Hematology

## 2024-02-09 NOTE — Telephone Encounter (Signed)
 Called the pt and she is aware of her appt and times.

## 2024-02-10 ENCOUNTER — Other Ambulatory Visit: Payer: Self-pay

## 2024-02-11 DIAGNOSIS — K56609 Unspecified intestinal obstruction, unspecified as to partial versus complete obstruction: Secondary | ICD-10-CM | POA: Diagnosis not present

## 2024-02-11 DIAGNOSIS — T8131XD Disruption of external operation (surgical) wound, not elsewhere classified, subsequent encounter: Secondary | ICD-10-CM | POA: Diagnosis not present

## 2024-02-11 DIAGNOSIS — Z933 Colostomy status: Secondary | ICD-10-CM | POA: Diagnosis not present

## 2024-02-11 DIAGNOSIS — S31109A Unspecified open wound of abdominal wall, unspecified quadrant without penetration into peritoneal cavity, initial encounter: Secondary | ICD-10-CM | POA: Diagnosis not present

## 2024-02-12 ENCOUNTER — Observation Stay (HOSPITAL_COMMUNITY)
Admission: EM | Admit: 2024-02-12 | Discharge: 2024-02-14 | Disposition: A | Discharge status: Home / Self Care | Attending: Internal Medicine | Admitting: Internal Medicine

## 2024-02-12 ENCOUNTER — Other Ambulatory Visit: Payer: Self-pay

## 2024-02-12 ENCOUNTER — Emergency Department (HOSPITAL_COMMUNITY)

## 2024-02-12 ENCOUNTER — Observation Stay (HOSPITAL_COMMUNITY)

## 2024-02-12 DIAGNOSIS — E039 Hypothyroidism, unspecified: Secondary | ICD-10-CM | POA: Diagnosis present

## 2024-02-12 DIAGNOSIS — C189 Malignant neoplasm of colon, unspecified: Secondary | ICD-10-CM | POA: Diagnosis present

## 2024-02-12 DIAGNOSIS — R200 Anesthesia of skin: Principal | ICD-10-CM | POA: Diagnosis present

## 2024-02-12 DIAGNOSIS — I6523 Occlusion and stenosis of bilateral carotid arteries: Secondary | ICD-10-CM | POA: Diagnosis not present

## 2024-02-12 DIAGNOSIS — G629 Polyneuropathy, unspecified: Secondary | ICD-10-CM | POA: Insufficient documentation

## 2024-02-12 DIAGNOSIS — R079 Chest pain, unspecified: Secondary | ICD-10-CM

## 2024-02-12 DIAGNOSIS — I6389 Other cerebral infarction: Principal | ICD-10-CM | POA: Insufficient documentation

## 2024-02-12 DIAGNOSIS — Z7982 Long term (current) use of aspirin: Secondary | ICD-10-CM | POA: Insufficient documentation

## 2024-02-12 DIAGNOSIS — Z452 Encounter for adjustment and management of vascular access device: Secondary | ICD-10-CM | POA: Diagnosis not present

## 2024-02-12 DIAGNOSIS — G8929 Other chronic pain: Secondary | ICD-10-CM | POA: Diagnosis not present

## 2024-02-12 DIAGNOSIS — R0789 Other chest pain: Secondary | ICD-10-CM | POA: Diagnosis not present

## 2024-02-12 DIAGNOSIS — I10 Essential (primary) hypertension: Secondary | ICD-10-CM | POA: Diagnosis not present

## 2024-02-12 DIAGNOSIS — R202 Paresthesia of skin: Secondary | ICD-10-CM | POA: Diagnosis present

## 2024-02-12 DIAGNOSIS — C787 Secondary malignant neoplasm of liver and intrahepatic bile duct: Secondary | ICD-10-CM | POA: Diagnosis not present

## 2024-02-12 DIAGNOSIS — Z79899 Other long term (current) drug therapy: Secondary | ICD-10-CM | POA: Diagnosis not present

## 2024-02-12 DIAGNOSIS — R29818 Other symptoms and signs involving the nervous system: Secondary | ICD-10-CM | POA: Diagnosis not present

## 2024-02-12 LAB — CBC
HCT: 36.7 % (ref 36.0–46.0)
Hemoglobin: 11.5 g/dL — ABNORMAL LOW (ref 12.0–15.0)
MCH: 29.8 pg (ref 26.0–34.0)
MCHC: 31.3 g/dL (ref 30.0–36.0)
MCV: 95.1 fL (ref 80.0–100.0)
Platelets: 154 K/uL (ref 150–400)
RBC: 3.86 MIL/uL — ABNORMAL LOW (ref 3.87–5.11)
RDW: 14.5 % (ref 11.5–15.5)
WBC: 5.2 K/uL (ref 4.0–10.5)
nRBC: 0 % (ref 0.0–0.2)

## 2024-02-12 LAB — COMPREHENSIVE METABOLIC PANEL WITH GFR
ALT: 16 U/L (ref 0–44)
AST: 24 U/L (ref 15–41)
Albumin: 3.3 g/dL — ABNORMAL LOW (ref 3.5–5.0)
Alkaline Phosphatase: 92 U/L (ref 38–126)
Anion gap: 11 (ref 5–15)
BUN: 18 mg/dL (ref 8–23)
CO2: 22 mmol/L (ref 22–32)
Calcium: 9.7 mg/dL (ref 8.9–10.3)
Chloride: 103 mmol/L (ref 98–111)
Creatinine, Ser: 1.13 mg/dL — ABNORMAL HIGH (ref 0.44–1.00)
GFR, Estimated: 55 mL/min — ABNORMAL LOW
Glucose, Bld: 94 mg/dL (ref 70–99)
Potassium: 4.4 mmol/L (ref 3.5–5.1)
Sodium: 136 mmol/L (ref 135–145)
Total Bilirubin: 0.3 mg/dL (ref 0.0–1.2)
Total Protein: 7 g/dL (ref 6.5–8.1)

## 2024-02-12 LAB — PROTIME-INR
INR: 1 (ref 0.8–1.2)
Prothrombin Time: 13.6 s (ref 11.4–15.2)

## 2024-02-12 LAB — RAPID URINE DRUG SCREEN, HOSP PERFORMED
Amphetamines: NOT DETECTED
Barbiturates: NOT DETECTED
Benzodiazepines: NOT DETECTED
Cocaine: NOT DETECTED
Opiates: NOT DETECTED
Tetrahydrocannabinol: NOT DETECTED

## 2024-02-12 LAB — DIFFERENTIAL
Abs Immature Granulocytes: 0.01 K/uL (ref 0.00–0.07)
Basophils Absolute: 0 K/uL (ref 0.0–0.1)
Basophils Relative: 1 %
Eosinophils Absolute: 0.2 K/uL (ref 0.0–0.5)
Eosinophils Relative: 3 %
Immature Granulocytes: 0 %
Lymphocytes Relative: 43 %
Lymphs Abs: 2.3 K/uL (ref 0.7–4.0)
Monocytes Absolute: 0.5 K/uL (ref 0.1–1.0)
Monocytes Relative: 9 %
Neutro Abs: 2.3 K/uL (ref 1.7–7.7)
Neutrophils Relative %: 44 %

## 2024-02-12 LAB — I-STAT CHEM 8, ED
BUN: 22 mg/dL (ref 8–23)
Calcium, Ion: 1.23 mmol/L (ref 1.15–1.40)
Chloride: 103 mmol/L (ref 98–111)
Creatinine, Ser: 1.2 mg/dL — ABNORMAL HIGH (ref 0.44–1.00)
Glucose, Bld: 87 mg/dL (ref 70–99)
HCT: 36 % (ref 36.0–46.0)
Hemoglobin: 12.2 g/dL (ref 12.0–15.0)
Potassium: 4.8 mmol/L (ref 3.5–5.1)
Sodium: 137 mmol/L (ref 135–145)
TCO2: 25 mmol/L (ref 22–32)

## 2024-02-12 LAB — TROPONIN I (HIGH SENSITIVITY): Troponin I (High Sensitivity): 8 ng/L (ref <18)

## 2024-02-12 LAB — HEMOGLOBIN A1C
Hgb A1c MFr Bld: 5.4 % (ref 4.8–5.6)
Mean Plasma Glucose: 108.28 mg/dL

## 2024-02-12 LAB — APTT: aPTT: 27 s (ref 24–36)

## 2024-02-12 LAB — ETHANOL: Alcohol, Ethyl (B): <15 mg/dL (ref <15)

## 2024-02-12 LAB — D-DIMER, QUANTITATIVE: D-Dimer, Quant: 0.95 ug{FEU}/mL — ABNORMAL HIGH (ref 0.00–0.50)

## 2024-02-12 LAB — CBG MONITORING, ED: Glucose-Capillary: 91 mg/dL (ref 70–99)

## 2024-02-12 MED ORDER — STROKE: EARLY STAGES OF RECOVERY BOOK: Freq: Once | Status: DC

## 2024-02-12 MED ORDER — ACETAMINOPHEN 160 MG/5ML PO SOLN: 650.0000 mg | Freq: Four times a day (QID) | ORAL | Status: DC | PRN

## 2024-02-12 MED ORDER — FERROUS SULFATE 325 (65 FE) MG PO TABS
325.0000 mg | ORAL_TABLET | Freq: Two times a day (BID) | ORAL | Status: DC
  Administered 2024-02-13 – 2024-02-14 (×3): 325 mg via ORAL
  Filled 2024-02-12 (×3): qty 1

## 2024-02-12 MED ORDER — ACETAMINOPHEN 325 MG PO TABS: 650.0000 mg | ORAL_TABLET | Freq: Four times a day (QID) | ORAL | Status: DC | PRN

## 2024-02-12 MED ORDER — LEVOTHYROXINE SODIUM 50 MCG PO TABS
50.0000 ug | ORAL_TABLET | Freq: Every day | ORAL | Status: DC
  Administered 2024-02-13 – 2024-02-14 (×2): 50 ug via ORAL
  Filled 2024-02-12 (×2): qty 1

## 2024-02-12 MED ORDER — ENOXAPARIN SODIUM 40 MG/0.4ML IJ SOSY: 40.0000 mg | PREFILLED_SYRINGE | INTRAMUSCULAR | Status: DC

## 2024-02-12 MED ORDER — GABAPENTIN 300 MG PO CAPS
300.0000 mg | ORAL_CAPSULE | Freq: Two times a day (BID) | ORAL | Status: DC
  Administered 2024-02-12 – 2024-02-14 (×4): 300 mg via ORAL
  Filled 2024-02-12 (×4): qty 1

## 2024-02-12 MED ORDER — ACETAMINOPHEN 650 MG RE SUPP: 650.0000 mg | Freq: Four times a day (QID) | RECTAL | Status: DC | PRN

## 2024-02-12 NOTE — ED Notes (Signed)
 Pt being Transferred to room 11

## 2024-02-12 NOTE — Consult Note (Signed)
 NEUROLOGY CONSULT NOTE   Date of service: February 12, 2024 Patient Name: Casey Black MRN:  [id number] DOB:  01/01/1961 Chief Complaint: code stroke for L sided numbness Requesting Provider: Tegeler, Lonni PARAS, *  History of Present Illness  Casey Black is a 63 y.o. 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 She was cooking dinner at 1700 when the symptoms started. Sat down and noticed that left arm felt numb and tingly. Came to the ED and this spread to her lower left face and left leg. She was activated as a code stroke.  No prior hx of stroke, mother had of stroke, quit smoking when she was diagnosed with colon cancer, does not   Endorse slightly decreased sensation to touch on the left compared to the right.  LKW: 1700 Modified rankin score: 0-Completely asymptomatic and back to baseline post- stroke IV Thrombolysis: not offered, too mild to treat. EVT: not offered, low suspicion for LVO.  NIHSS components Score: Comment  1a Level of Conscious 0[x]  1[]  2[]  3[]      1b LOC Questions 0[x]  1[]  2[]       1c LOC Commands 0[x]  1[]  2[]       2 Best Gaze 0[x]  1[]  2[]       3 Visual 0[x]  1[]  2[]  3[]      4 Facial Palsy 0[x]  1[]  2[]  3[]      5a Motor Arm - left 0[x]  1[]  2[]  3[]  4[]  UN[]    5b Motor Arm - Right 0[x]  1[]  2[]  3[]  4[]  UN[]    6a Motor Leg - Left 0[x]  1[]  2[]  3[]  4[]  UN[]    6b Motor Leg - Right 0[x]  1[]  2[]  3[]  4[]  UN[]    7 Limb Ataxia 0[x]  1[]  2[]  UN[]      8 Sensory 0[]  1[x]  2[]  UN[]    Very mildly decreased sensation to touch on the left.  9 Best Language 0[x]  1[]  2[]  3[]      10 Dysarthria 0[x]  1[]  2[]  UN[]      11 Extinct. and Inattention 0[x]  1[]  2[]       TOTAL:       ROS  Comprehensive ROS performed and pertinent positives documented in HPI   Past History   Past Medical History:  Diagnosis Date   Anemia    low iron   Colon cancer (HCC) 01/2019   Hypertension  01/23/2019   Personal history of chemotherapy 01/2019   colon CA   SBO (small bowel obstruction) (HCC) 01/23/2019    Past Surgical History:  Procedure Laterality Date   CESAREAN SECTION     x2   COLOSTOMY N/A 01/24/2019   Procedure: End Loop Colostomy;  Surgeon: [name], MD;  Location: [facility] OR;  Service: General;  Laterality: N/A;   PARTIAL COLECTOMY N/A 01/24/2019   Procedure: PARTIAL COLECTOMY;  Surgeon: [name], MD;  Location: [facility] OR;  Service: General;  Laterality: N/A;   PORTACATH PLACEMENT Right 02/28/2019   Procedure: INSERTION PORT-A-CATH WITH ULTRASOUND GUIDANCE;  Surgeon: [name], MD;  Location: [facility] OR;  Service: General;  Laterality: Right;    Family History: Family History  Problem Relation Age of Onset   Heart murmur Mother    Kidney failure Father    Hypertension Father    Hypertension Sister     Social History  reports that she quit smoking about 5 years ago. Her smoking use included cigarettes. She started smoking about 34 years ago. She  has a 14.5 pack-year smoking history. She has never used smokeless tobacco. She reports that she does not currently use alcohol. She reports that she does not use drugs.  No Known Allergies  Medications  No current facility-administered medications for this encounter.  Current Outpatient Medications:    acetaminophen  (TYLENOL ) 325 MG tablet, Take 2 tablets (650 mg total) by mouth every 6 (six) hours as needed., Disp: , Rfl:    amLODipine  (NORVASC ) 10 MG tablet, TAKE 1 TABLET BY MOUTH EVERY DAY, Disp: 90 tablet, Rfl: 1   dexamethasone  (DECADRON ) 4 MG tablet, Take 2 tablets (8 mg total) by mouth daily. Start the day after chemotherapy for 2 days. Take with food., Disp: 8 tablet, Rfl: 5   ferrous sulfate  325 (65 FE) MG tablet, TAKE 1 TABLET BY MOUTH TWICE A DAY WITH FOOD, Disp: 180 tablet, Rfl: 1   gabapentin  (NEURONTIN ) 300 MG capsule, TAKE 1 CAPSULE BY MOUTH TWICE A DAY, Disp: 60 capsule, Rfl: 3    hydrALAZINE  (APRESOLINE ) 25 MG tablet, TAKE 1 TABLET BY MOUTH EVERY 8 HOURS, Disp: 270 tablet, Rfl: 0   hydrochlorothiazide  (HYDRODIURIL ) 12.5 MG tablet, TAKE 1 TABLET BY MOUTH EVERY DAY, Disp: 90 tablet, Rfl: 1   hydrocortisone  (ANUSOL -HC) 2.5 % rectal cream, Place 1 application rectally 2 (two) times daily., Disp: 30 g, Rfl: 0   Ibuprofen  200 MG CAPS, Take 400 mg by mouth daily as needed (pain)., Disp: , Rfl:    isosorbide  mononitrate (IMDUR ) 30 MG 24 hr tablet, TAKE 1 TABLET BY MOUTH EVERY DAY, Disp: 90 tablet, Rfl: 1   levothyroxine  (SYNTHROID ) 50 MCG tablet, Take 1 tablet (50 mcg total) by mouth daily., Disp: 90 tablet, Rfl: 0   lidocaine  (LMX) 4 % cream, Apply 1 application topically 3 (three) times daily as needed., Disp: 30 g, Rfl: 0   lidocaine -prilocaine  (EMLA ) cream, APPLY 1 APPLICATION TOPICALLY AS NEEDED., Disp: 30 g, Rfl: 1   lidocaine -prilocaine  (EMLA ) cream, Apply to affected area once, Disp: 30 g, Rfl: 3   lisinopril  (ZESTRIL ) 40 MG tablet, TAKE 1 TABLET BY MOUTH EVERY DAY, Disp: 90 tablet, Rfl: 1   loperamide  (IMODIUM ) 2 MG capsule, Take 2 tabs by mouth with first loose stool, then 1 tab with each additional loose stool as needed. Do not exceed 8 tabs in a 24-hour period, Disp: 60 capsule, Rfl: 3   ondansetron  (ZOFRAN ) 8 MG tablet, Take 1 tablet (8 mg total) by mouth every 8 (eight) hours as needed for nausea or vomiting., Disp: 20 tablet, Rfl: 2   ondansetron  (ZOFRAN ) 8 MG tablet, Take 1 tablet (8 mg total) by mouth every 8 (eight) hours as needed for nausea, vomiting or refractory nausea / vomiting. Start on the third day after chemotherapy., Disp: 30 tablet, Rfl: 1   polycarbophil (FIBERCON) 625 MG tablet, Take 1 tablet (625 mg total) by mouth daily., Disp: 30 tablet, Rfl: 0   prochlorperazine  (COMPAZINE ) 10 MG tablet, Take 1 tablet (10 mg total) by mouth every 6 (six) hours as needed for nausea or vomiting., Disp: 30 tablet, Rfl: 2   prochlorperazine  (COMPAZINE ) 10 MG tablet,  Take 1 tablet (10 mg total) by mouth every 6 (six) hours as needed for nausea or vomiting., Disp: 30 tablet, Rfl: 1   Vitamin D , Ergocalciferol , (DRISDOL ) 1.25 MG (50000 UNIT) CAPS capsule, Take 1 capsule (50,000 Units total) by mouth every 7 (seven) days for 12 doses., Disp: 12 capsule, Rfl: 0  Facility-Administered Medications Ordered in Other Encounters:    sodium chloride   flush (NS) 0.9 % injection 10 mL, 10 mL, Intracatheter, PRN, [name], MD, 10 mL at 06/06/20 1051  Vitals   Vitals:   2024-02-13 2001 02-13-24 2003 2024-02-13 2015  BP: (!) 192/100 (!) 159/83 (!) 163/69  Pulse: 70 66   Resp: 17 14 11   Temp: 98 F (36.7 C) 97.6 F (36.4 C)   TempSrc: Oral Oral   SpO2: 97% 100%     There is no height or weight on file to calculate BMI.   Physical Exam   General: Laying comfortably in bed; in no acute distress.  HENT: Normal oropharynx and mucosa. Normal external appearance of ears and nose.  Neck: Supple, no pain or tenderness  CV: No JVD. No peripheral edema.  Pulmonary: Symmetric Chest rise. Normal respiratory effort.  Abdomen: Soft to touch, non-tender.  Ext: No cyanosis, edema, or deformity  Skin: No rash. Normal palpation of skin.   Musculoskeletal: Normal digits and nails by inspection. No clubbing.   Neurologic Examination  Mental status/Cognition: Alert, oriented to self, place, month and year, good attention.  Speech/language: Fluent, comprehension intact, object naming intact, repetition intact.  Cranial nerves:   CN II Pupils equal and reactive to light, no VF deficits    CN III,IV,VI EOM intact, no gaze preference or deviation, no nystagmus    CN V normal sensation in V1, V2, and V3 segments bilaterally    CN VII no asymmetry, no nasolabial fold flattening    CN VIII normal hearing to speech    CN IX & X normal palatal elevation, no uvular deviation    CN XI 5/5 head turn and 5/5 shoulder shrug bilaterally    CN XII midline tongue protrusion    Motor:   Muscle bulk: normal, tone normal, pronator drift none tremor none Mvmt Root Nerve  Muscle Right Left Comments  SA C5/6 Ax Deltoid 5 5   EF C5/6 Mc Biceps 5 5   EE C6/7/8 Rad Triceps 5 5   WF C6/7 Med FCR     WE C7/8 PIN ECU     F Ab C8/T1 U ADM/FDI 5 4+   HF L1/2/3 Fem Illopsoas 4+ 4+   KE L2/3/4 Fem Quad 5 5   DF L4/5 D Peron Tib Ant 5 5   PF S1/2 Tibial Grc/Sol 5 5    Sensation:  Light touch Slightly decreased to touch in L lower face, left arm and left leg.   Pin prick    Temperature    Vibration   Proprioception    Coordination/Complex Motor:  - Finger to Nose intact BL - Heel to shin intact BL - Rapid alternating movement are normal - Gait: deferred.  Labs/Imaging/Neurodiagnostic studies   CBC:  Recent Labs  Lab 02/13/24 2040 Feb 13, 2024 2047  WBC 5.2  --   NEUTROABS 2.3  --   HGB 11.5* 12.2  HCT 36.7 36.0  MCV 95.1  --   PLT 154  --    Basic Metabolic Panel:  Lab Results  Component Value Date   NA 137 02-13-24   K 4.8 02/13/2024   CO2 26 02/02/2024   GLUCOSE 87 Feb 13, 2024   BUN 22 02/13/2024   CREATININE 1.20 (H) 13-Feb-2024   CALCIUM  9.8 02/02/2024   GFRNONAA 56 (L) 02/02/2024   GFRAA >60 01/07/2020   Lipid Panel:  Lab Results  Component Value Date   LDLCALC 73 01/23/2024   HgbA1c:  Lab Results  Component Value Date   HGBA1C 5.5 02/01/2023   Urine  Drug Screen: No results found for: LABOPIA, COCAINSCRNUR, LABBENZ, AMPHETMU, THCU, LABBARB  Alcohol Level No results found for: ETH INR No results found for: INR APTT No results found for: APTT AED levels: No results found for: PHENYTOIN, ZONISAMIDE, LAMOTRIGINE, LEVETIRACETA  CT Head without contrast(Personally reviewed): CTH was negative for a large hypodensity concerning for a large territory infarct or hyperdensity concerning for an ICH  CT angio Head and Neck with contrast(Personally reviewed): Pending  MRI Brain with and without contrast(Personally  reviewed): Pending  ASSESSMENT   Casey Black is a 63 y.o. 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 She was cooking dinner at 1700 when the symptoms started. Sat down and noticed that left arm felt numb and tingly. Came to the ED and this spread to her lower left face and left leg. She was activated as a code stroke.  Symptoms do seem to be most concerning for a small acute small vessel stroke specially with no motor weakness. With her hx of stage 4 colon cancer, a small brain met is also on the differential.   RECOMMENDATIONS  - Recommend starting with MRI Brain with and without contrast. If no brain mets, recommend giving antiplatelet and stroke workup. ______________________________________________________________________  Plan discussed with Dr. Ginger with the ED team and with patient at the bedside.  I personally spent a total of 75 minutes in the care of the patient today including preparing to see the patient, getting/reviewing separately obtained history, performing a medically appropriate exam/evaluation, counseling and educating, placing orders, referring and communicating with other health care professionals, independently interpreting results, communicating results, and coordinating care.   Signed, Andren Bethea, MD [facility]

## 2024-02-12 NOTE — ED Notes (Signed)
 Labs collected.

## 2024-02-12 NOTE — H&P (Addendum)
 History and Physical    Casey Black FMW:[id number] DOB: 02-Apr-1961 DOA: 02/12/2024  PCP: [name], [name], MD  Patient coming from: Home  Chief Complaint: Chest pain, left-sided numbness  HPI: Casey Black is a 63 y.o. female with medical history significant of stage IV colon cancer with liver and nodal metastasis on chemotherapy, previous SBO status post partial colectomy with right colostomy, hypertension, anemia, hypothyroidism, CHB, PSVT, anxiety presented to ED via EMS with complaints of chest pain and left-sided numbness.  Patient states she was feeling well, took her medications at around 5 PM and was cooking dinner when all of a sudden she started having left-sided chest pressure and also numbness of the entire left side of her body including face, arm, and leg which has been persistent since then.  States she has had numbness of her left arm a few times in the past and her oncologist had advised her to see a cardiologist.  Her left-sided chest pressure is mild and also having some mild dyspnea.  She reports history of chronic left leg swelling and pain and states her oncologist had done an ultrasound a year ago which did not show blood clots.  She receives chemotherapy for her colon cancer and last treatment was on October 30th.  No other complaints.  Denies nausea, vomiting, or abdominal pain.  ED Course: Code stroke activated on arrival.  Blood pressure 192/100 on arrival.  CT head showing no acute intracranial abnormality.  Neurology recommended admission for brain MRI and further stroke workup.  Labs showing hemoglobin 11.5 (at baseline), ethanol level <15, ammonia level pending, UDS negative, troponin x1 negative.  EKG showing sinus rhythm and no acute ischemic changes.  Review of Systems:  Review of Systems  All other systems reviewed and are negative.   Past Medical History:  Diagnosis Date   Anemia    low iron   Colon cancer (HCC) 01/2019   Hypertension  01/23/2019   Personal history of chemotherapy 01/2019   colon CA   SBO (small bowel obstruction) (HCC) 01/23/2019    Past Surgical History:  Procedure Laterality Date   CESAREAN SECTION     x2   COLOSTOMY N/A 01/24/2019   Procedure: End Loop Colostomy;  Surgeon: [name], MD;  Location: [facility] OR;  Service: General;  Laterality: N/A;   PARTIAL COLECTOMY N/A 01/24/2019   Procedure: PARTIAL COLECTOMY;  Surgeon: [name], MD;  Location: [facility] OR;  Service: General;  Laterality: N/A;   PORTACATH PLACEMENT Right 02/28/2019   Procedure: INSERTION PORT-A-CATH WITH ULTRASOUND GUIDANCE;  Surgeon: [name], MD;  Location: [facility] OR;  Service: General;  Laterality: Right;     reports that she quit smoking about 5 years ago. Her smoking use included cigarettes. She started smoking about 34 years ago. She has a 14.5 pack-year smoking history. She has never used smokeless tobacco. She reports that she does not currently use alcohol. She reports that she does not use drugs.  No Known Allergies  Family History  Problem Relation Age of Onset   Heart murmur Mother    Kidney failure Father    Hypertension Father    Hypertension Sister     Prior to Admission medications   Medication Sig Start Date End Date Taking? Authorizing Provider  acetaminophen  (TYLENOL ) 325 MG tablet Take 2 tablets (650 mg total) by mouth every 6 (six) hours as needed. 02/03/19  Yes Rizwan, Saima, MD  amLODipine  (NORVASC ) 10 MG tablet TAKE 1 TABLET  BY MOUTH EVERY DAY 08/15/23  Yes [name], [name], MD  dexamethasone  (DECADRON ) 4 MG tablet Take 2 tablets (8 mg total) by mouth daily. Start the day after chemotherapy for 2 days. Take with food. 10/27/23  Yes [name], MD  ferrous sulfate  325 (65 FE) MG tablet TAKE 1 TABLET BY MOUTH TWICE A DAY WITH FOOD 08/19/23  Yes [name], MD  gabapentin  (NEURONTIN ) 300 MG capsule TAKE 1 CAPSULE BY MOUTH TWICE A DAY 10/06/23  Yes [name], [name], NP  hydrALAZINE   (APRESOLINE ) 25 MG tablet TAKE 1 TABLET BY MOUTH EVERY 8 HOURS 08/15/23  Yes [name], [name], MD  hydrochlorothiazide  (HYDRODIURIL ) 12.5 MG tablet TAKE 1 TABLET BY MOUTH EVERY DAY 07/18/23  Yes [name], [name], MD  isosorbide  mononitrate (IMDUR ) 30 MG 24 hr tablet TAKE 1 TABLET BY MOUTH EVERY DAY 08/15/23   [name], [name], MD  levothyroxine  (SYNTHROID ) 50 MCG tablet Take 1 tablet (50 mcg total) by mouth daily. 01/24/24   [name], [name], MD  lidocaine  (LMX) 4 % cream Apply 1 application topically 3 (three) times daily as needed. 08/23/19   Joy, Elouise C, PA-C  lidocaine -prilocaine  (EMLA ) cream APPLY 1 APPLICATION TOPICALLY AS NEEDED. 06/02/22   [name], MD  lidocaine -prilocaine  (EMLA ) cream Apply to affected area once 10/27/23   [name], MD  lisinopril  (ZESTRIL ) 40 MG tablet TAKE 1 TABLET BY MOUTH EVERY DAY 04/25/23   [name], [name], MD  loperamide  (IMODIUM ) 2 MG capsule Take 2 tabs by mouth with first loose stool, then 1 tab with each additional loose stool as needed. Do not exceed 8 tabs in a 24-hour period 10/27/23   [name], MD  ondansetron  (ZOFRAN ) 8 MG tablet Take 1 tablet (8 mg total) by mouth every 8 (eight) hours as needed for nausea or vomiting. 04/20/23   Boscia, Heather E, NP  ondansetron  (ZOFRAN ) 8 MG tablet Take 1 tablet (8 mg total) by mouth every 8 (eight) hours as needed for nausea, vomiting or refractory nausea / vomiting. Start on the third day after chemotherapy. 10/27/23   [name], MD  polycarbophil (FIBERCON) 625 MG tablet Take 1 tablet (625 mg total) by mouth daily. 02/03/19   Rizwan, Saima, MD  prochlorperazine  (COMPAZINE ) 10 MG tablet Take 1 tablet (10 mg total) by mouth every 6 (six) hours as needed for nausea or vomiting. 04/20/23   Boscia, Heather E, NP  prochlorperazine  (COMPAZINE ) 10 MG tablet Take 1 tablet (10 mg total) by mouth every 6 (six) hours as needed for nausea or vomiting. 10/27/23   [name], MD  Vitamin D ,  Ergocalciferol , (DRISDOL ) 1.25 MG (50000 UNIT) CAPS capsule Take 1 capsule (50,000 Units total) by mouth every 7 (seven) days for 12 doses. 01/24/24 04/11/24  [name] [name], MD    Physical Exam: Vitals:   02/12/24 2050 02/12/24 2100 02/12/24 2145 02/12/24 2230  BP:  (!) 178/68 126/62 (!) 147/69  Pulse:    77  Resp: (!) 26 (!) 22 20 20   Temp:      TempSrc:      SpO2:    98%    Physical Exam Vitals reviewed.  Constitutional:      General: She is not in acute distress. HENT:     Head: Normocephalic and atraumatic.  Eyes:     Extraocular Movements: Extraocular movements intact.  Cardiovascular:     Rate and Rhythm: Normal rate and regular rhythm.     Heart  sounds: Normal heart sounds.  Pulmonary:     Effort: Pulmonary effort is normal. No respiratory distress.     Breath sounds: Normal breath sounds.  Abdominal:     General: Bowel sounds are normal.     Palpations: Abdomen is soft.     Tenderness: There is no abdominal tenderness. There is no guarding.     Comments: Brown stool in colostomy bag  Musculoskeletal:     Cervical back: Normal range of motion.     Right lower leg: Edema present.     Left lower leg: Edema present.  Skin:    General: Skin is warm and dry.  Neurological:     General: No focal deficit present.     Mental Status: She is alert and oriented to person, place, and time.     Labs on Admission: I have personally reviewed following labs and imaging studies  CBC: Recent Labs  Lab 02/12/24 2040 02/12/24 2047  WBC 5.2  --   NEUTROABS 2.3  --   HGB 11.5* 12.2  HCT 36.7 36.0  MCV 95.1  --   PLT 154  --    Basic Metabolic Panel: Recent Labs  Lab 02/12/24 2040 02/12/24 2047  NA 136 137  K 4.4 4.8  CL 103 103  CO2 22  --   GLUCOSE 94 87  BUN 18 22  CREATININE 1.13* 1.20*  CALCIUM  9.7  --    GFR: Estimated Creatinine Clearance: 61.3 mL/min (A) (by C-G formula based on SCr of 1.2 mg/dL (H)). Liver Function Tests: Recent Labs   Lab 02/12/24 2040  AST 24  ALT 16  ALKPHOS 92  BILITOT 0.3  PROT 7.0  ALBUMIN 3.3*   No results for input(s): LIPASE, AMYLASE in the last 168 hours. No results for input(s): AMMONIA in the last 168 hours. Coagulation Profile: Recent Labs  Lab 02/12/24 2040  INR 1.0   Cardiac Enzymes: No results for input(s): CKTOTAL, CKMB, CKMBINDEX, TROPONINI in the last 168 hours. BNP (last 3 results) No results for input(s): PROBNP in the last 8760 hours. HbA1C: No results for input(s): HGBA1C in the last 72 hours. CBG: Recent Labs  Lab 02/12/24 2041  GLUCAP 91   Lipid Profile: No results for input(s): CHOL, HDL, LDLCALC, TRIG, CHOLHDL, LDLDIRECT in the last 72 hours. Thyroid  Function Tests: No results for input(s): TSH, T4TOTAL, FREET4, T3FREE, THYROIDAB in the last 72 hours. Anemia Panel: No results for input(s): VITAMINB12, FOLATE, FERRITIN, TIBC, IRON, RETICCTPCT in the last 72 hours. Urine analysis:    Component Value Date/Time   COLORURINE YELLOW 12/01/2020 1917   APPEARANCEUR CLEAR 12/01/2020 1917   LABSPEC 1.011 12/01/2020 1917   PHURINE 6.0 12/01/2020 1917   GLUCOSEU NEGATIVE 12/01/2020 1917   HGBUR NEGATIVE 12/01/2020 1917   BILIRUBINUR NEGATIVE 12/01/2020 1917   KETONESUR NEGATIVE 12/01/2020 1917   PROTEINUR 30 (A) 02/02/2024 1218   NITRITE NEGATIVE 12/01/2020 1917   LEUKOCYTESUR SMALL (A) 12/01/2020 1917    Radiological Exams on Admission: CT HEAD CODE STROKE WO CONTRAST (LKW 0-4.5h, LVO 0-24h) Result Date: 02/12/2024 EXAM: CT HEAD WITHOUT CONTRAST 02/12/2024 08:57:06 PM TECHNIQUE: CT of the head was performed without the administration of intravenous contrast. Automated exposure control, iterative reconstruction, and/or weight based adjustment of the mA/kV was utilized to reduce the radiation dose to as low as reasonably achievable. COMPARISON: None available. CLINICAL HISTORY: Neuro deficit, acute, stroke  suspected. FINDINGS: BRAIN AND VENTRICLES: No acute hemorrhage. No evidence of acute infarct. No hydrocephalus. No extra-axial  collection. No mass effect or midline shift. ORBITS: No acute abnormality. SINUSES: No acute abnormality. SOFT TISSUES AND SKULL: No acute soft tissue abnormality. No skull fracture. Findings conveyed to Dr. Vanessa via pager at 9:05 PM IMPRESSION: 1. No acute intracranial abnormality. Electronically signed by: Gilmore Molt MD 02/12/2024 09:06 PM EST RP Workstation: [id number]    Assessment and Plan  Left-sided numbness CT head showing no acute intracranial abnormality.  Neurology recommended admission for brain MRI and further stroke workup. -Neurology consulted -MRI of the brain without contrast -TTE -Hemoglobin A1c, fasting lipid panel -Antithrombotic therapy per neurology recommendation -Frequent neurochecks -PT, OT, speech therapy. -N.p.o. until cleared by bedside swallow evaluation or formal speech evaluation   Addendum 02/12/2024 at 11:48 PM: Neurology recommending MRI of brain with and without contrast.  Since the patient has history of colon cancer, there is concern that there could be small metastases which could be causing her symptoms.  Since colon cancer mets are prone to bleeding, neurology will make a decision regarding antiplatelet therapy after MRI.  Left-sided chest pain Patient is endorsing nonexertional mild left-sided chest pressure.  EKG without acute ischemic changes.  Troponin x 1 negative.  UDS negative.  ?PE given history of cancer but chest pain is not pleuritic.  She does have bilateral lower extremity edema (left greater than right) but according to the patient this is chronic.  Patient is not tachycardic, tachypneic, or hypoxic.  She is satting in the high 90s on room air. -Cardiac monitoring -Trend troponin -Chest x-ray -D-dimer  Stage IV colon cancer with liver and nodal metastasis Currently on chemotherapy.  Outpatient oncology  follow-up.  Hypertension Holding antihypertensives at this time to allow permissive hypertension given concern for acute stroke.  Chronic normocytic anemia Hemoglobin at baseline.  Continue home iron supplement.  Hypothyroidism Continue Synthroid .  Chronic pain/neuropathy Continue gabapentin .  DVT prophylaxis: Avoiding anticoagulation until brain MRI is done.  Avoiding SCDs until D-dimer is checked. Code Status: Full Code (discussed with the patient) Level of care: Telemetry bed Admission status: It is my clinical opinion that referral for OBSERVATION is reasonable and necessary in this patient based on the above information provided. The aforementioned taken together are felt to place the patient at high risk for further clinical deterioration. However, it is anticipated that the patient may be medically stable for discharge from the hospital within 24 to 48 hours.  Editha Ram MD [facility]  If 7PM-7AM, please contact night-coverage [email or web address]  02/12/2024, 10:44 PM

## 2024-02-12 NOTE — Code Documentation (Signed)
 Stroke Response Nurse Documentation Code Documentation  Casey Black is a 63 y.o. female arriving to [facility]  via [employer] on 11/9 with past medical hx of HTN, prior SBO, colon cancer s/p chemo, anemia . On No antithrombotic. Code stroke was activated by ED.   Patient from home where she was LKW at 1700 and now complaining of left sided numbness.   Stroke team at the bedside on patient arrival. Labs drawn and patient cleared for CT by Dr. Ginger. Patient to CT with team. NIHSS 1, see documentation for details and code stroke times. Patient with left decreased sensation on exam. The following imaging was completed:  CT Head. Patient is not a candidate for IV Thrombolytic due to too mild to treat. Patient is not a candidate for IR due to low suspicion of LVO.   Care Plan: Neuro checks q2 hrs..    Bedside handoff with ED RN .    Griselda Alm ORN  Rapid Response RN

## 2024-02-12 NOTE — ED Notes (Signed)
 Pt going to CT

## 2024-02-12 NOTE — ED Notes (Signed)
 No MD assigned yet for orders. Escalated.

## 2024-02-12 NOTE — ED Notes (Signed)
 Called CCMD

## 2024-02-12 NOTE — ED Triage Notes (Signed)
 Pt came in by EMS from home due to left sided chest pressure and left sided arm numbness that started at 1700 today that has not subsided. Hx stage 4 colon cancer, has had this sensation before after treatment.

## 2024-02-12 NOTE — ED Notes (Signed)
Pt has colostomy bag.

## 2024-02-12 NOTE — ED Notes (Signed)
 Upon assessment , MD wants to activate a code stroke

## 2024-02-12 NOTE — ED Provider Notes (Signed)
 [place] EMERGENCY DEPARTMENT AT [facility] Provider Note   CSN: [id number] Arrival date & time: 02/12/24  1953     Patient presents with: Numbness and Chest Pain   Casey Black is a 63 y.o. female.   The history is provided by the patient, medical records and the EMS personnel. No language interpreter was used.  Chest Pain Pain location:  L chest Pain quality: aching and dull   Pain radiates to:  Does not radiate Pain severity:  Moderate Onset quality:  Unable to specify Timing:  Unable to specify Progression:  Waxing and waning Chronicity:  Recurrent Relieved by:  Nothing Worsened by:  Nothing Associated symptoms: numbness   Associated symptoms: no abdominal pain, no back pain, no cough, no dizziness, no fatigue, no headache, no nausea, no palpitations, no shortness of breath, no vomiting and no weakness        Prior to Admission medications   Medication Sig Start Date End Date Taking? Authorizing Provider  acetaminophen  (TYLENOL ) 325 MG tablet Take 2 tablets (650 mg total) by mouth every 6 (six) hours as needed. 02/03/19   Rizwan, Saima, MD  amLODipine  (NORVASC ) 10 MG tablet TAKE 1 TABLET BY MOUTH EVERY DAY 08/15/23   [name], [name], MD  dexamethasone  (DECADRON ) 4 MG tablet Take 2 tablets (8 mg total) by mouth daily. Start the day after chemotherapy for 2 days. Take with food. 10/27/23   [name], MD  ferrous sulfate  325 (65 FE) MG tablet TAKE 1 TABLET BY MOUTH TWICE A DAY WITH FOOD 08/19/23   [name], MD  gabapentin  (NEURONTIN ) 300 MG capsule TAKE 1 CAPSULE BY MOUTH TWICE A DAY 10/06/23   [name], [name], NP  hydrALAZINE  (APRESOLINE ) 25 MG tablet TAKE 1 TABLET BY MOUTH EVERY 8 HOURS 08/15/23   [name], [name], MD  hydrochlorothiazide  (HYDRODIURIL ) 12.5 MG tablet TAKE 1 TABLET BY MOUTH EVERY DAY 07/18/23   [name], [name], MD  hydrocortisone  (ANUSOL -HC) 2.5 % rectal cream Place 1 application rectally 2 (two) times daily.  08/23/19   Joy, Shawn C, PA-C  Ibuprofen  200 MG CAPS Take 400 mg by mouth daily as needed (pain).    [provider]  isosorbide  mononitrate (IMDUR ) 30 MG 24 hr tablet TAKE 1 TABLET BY MOUTH EVERY DAY 08/15/23   [name], [name], MD  levothyroxine  (SYNTHROID ) 50 MCG tablet Take 1 tablet (50 mcg total) by mouth daily. 01/24/24   [name], [name], MD  lidocaine  (LMX) 4 % cream Apply 1 application topically 3 (three) times daily as needed. 08/23/19   Joy, Shawn C, PA-C  lidocaine -prilocaine  (EMLA ) cream APPLY 1 APPLICATION TOPICALLY AS NEEDED. 06/02/22   [name], MD  lidocaine -prilocaine  (EMLA ) cream Apply to affected area once 10/27/23   [name], MD  lisinopril  (ZESTRIL ) 40 MG tablet TAKE 1 TABLET BY MOUTH EVERY DAY 04/25/23   [name], [name], MD  loperamide  (IMODIUM ) 2 MG capsule Take 2 tabs by mouth with first loose stool, then 1 tab with each additional loose stool as needed. Do not exceed 8 tabs in a 24-hour period 10/27/23   [name], MD  ondansetron  (ZOFRAN ) 8 MG tablet Take 1 tablet (8 mg total) by mouth every 8 (eight) hours as needed for nausea or vomiting. 04/20/23   Boscia, Heather E, NP  ondansetron  (ZOFRAN ) 8 MG tablet Take 1 tablet (8 mg total) by mouth every 8 (eight) hours as needed for nausea, vomiting or refractory nausea /  vomiting. Start on the third day after chemotherapy. 10/27/23   [name], MD  polycarbophil (FIBERCON) 625 MG tablet Take 1 tablet (625 mg total) by mouth daily. 02/03/19   Rizwan, Saima, MD  prochlorperazine  (COMPAZINE ) 10 MG tablet Take 1 tablet (10 mg total) by mouth every 6 (six) hours as needed for nausea or vomiting. 04/20/23   Boscia, Heather E, NP  prochlorperazine  (COMPAZINE ) 10 MG tablet Take 1 tablet (10 mg total) by mouth every 6 (six) hours as needed for nausea or vomiting. 10/27/23   [name], MD  Vitamin D , Ergocalciferol , (DRISDOL ) 1.25 MG (50000 UNIT) CAPS capsule Take 1 capsule (50,000 Units total) by mouth every  7 (seven) days for 12 doses. 01/24/24 04/11/24  Theophilus Delma Tully [name], MD    Allergies: Patient has no known allergies.    Review of Systems  Constitutional:  Negative for chills and fatigue.  HENT:  Negative for congestion.   Eyes:  Negative for visual disturbance.  Respiratory:  Negative for cough, chest tightness and shortness of breath.   Cardiovascular:  Positive for chest pain. Negative for palpitations and leg swelling.  Gastrointestinal:  Negative for abdominal pain, constipation, diarrhea, nausea and vomiting.  Genitourinary:  Negative for dysuria.  Musculoskeletal:  Negative for back pain, neck pain and neck stiffness.  Skin:  Negative for rash and wound.  Neurological:  Positive for numbness. Negative for dizziness, seizures, weakness, light-headedness and headaches.  All other systems reviewed and are negative.   Updated Vital Signs BP (!) 159/83 (BP Location: Left Arm)   Pulse 66   Temp 97.6 F (36.4 C) (Oral)   Resp 14   SpO2 100%   Physical Exam Vitals and nursing note reviewed.  Constitutional:      General: She is not in acute distress.    Appearance: She is well-developed. She is not ill-appearing, toxic-appearing or diaphoretic.  HENT:     Head: Normocephalic and atraumatic.     Nose: No congestion or rhinorrhea.     Mouth/Throat:     Mouth: Mucous membranes are moist.     Pharynx: No oropharyngeal exudate or posterior oropharyngeal erythema.  Eyes:     Extraocular Movements: Extraocular movements intact.     Conjunctiva/sclera: Conjunctivae normal.     Pupils: Pupils are equal, round, and reactive to light.  Cardiovascular:     Rate and Rhythm: Normal rate and regular rhythm.     Heart sounds: No murmur heard. Pulmonary:     Effort: Pulmonary effort is normal. No respiratory distress.     Breath sounds: Normal breath sounds. No wheezing.  Chest:     Chest wall: Tenderness present.  Abdominal:     Palpations: Abdomen is soft.     Tenderness:  There is no abdominal tenderness. There is no guarding or rebound.  Musculoskeletal:        General: No swelling.     Cervical back: Neck supple. No tenderness.     Right lower leg: No tenderness. No edema.     Left lower leg: No tenderness. No edema.  Skin:    General: Skin is warm and dry.     Capillary Refill: Capillary refill takes less than 2 seconds.     Findings: No erythema or rash.  Neurological:     Mental Status: She is alert.     Cranial Nerves: No dysarthria or facial asymmetry.     Sensory: Sensory deficit present.     Motor: No weakness or abnormal muscle  tone.     Comments: Patient has numbness in left lower face, left arm and left leg compared to right side.  After this evaluation, code stroke was activated.  Psychiatric:        Mood and Affect: Mood normal.     (all labs ordered are listed, but only abnormal results are displayed) Labs Reviewed  CBC - Abnormal; Notable for the following components:      Result Value   RBC 3.86 (*)    Hemoglobin 11.5 (*)    All other components within normal limits  COMPREHENSIVE METABOLIC PANEL WITH GFR - Abnormal; Notable for the following components:   Creatinine, Ser 1.13 (*)    Albumin 3.3 (*)    GFR, Estimated 55 (*)    All other components within normal limits  I-STAT CHEM 8, ED - Abnormal; Notable for the following components:   Creatinine, Ser 1.20 (*)    All other components within normal limits  PROTIME-INR  APTT  DIFFERENTIAL  ETHANOL  RAPID URINE DRUG SCREEN, HOSP PERFORMED  HEMOGLOBIN A1C  AMMONIA  HIV ANTIBODY (ROUTINE TESTING W REFLEX)  LIPID PANEL  D-DIMER, QUANTITATIVE  CBG MONITORING, ED  TROPONIN I (HIGH SENSITIVITY)  TROPONIN I (HIGH SENSITIVITY)    EKG: None  Radiology: CT HEAD CODE STROKE WO CONTRAST (LKW 0-4.5h, LVO 0-24h) Result Date: 02/12/2024 EXAM: CT HEAD WITHOUT CONTRAST 02/12/2024 08:57:06 PM TECHNIQUE: CT of the head was performed without the administration of intravenous  contrast. Automated exposure control, iterative reconstruction, and/or weight based adjustment of the mA/kV was utilized to reduce the radiation dose to as low as reasonably achievable. COMPARISON: None available. CLINICAL HISTORY: Neuro deficit, acute, stroke suspected. FINDINGS: BRAIN AND VENTRICLES: No acute hemorrhage. No evidence of acute infarct. No hydrocephalus. No extra-axial collection. No mass effect or midline shift. ORBITS: No acute abnormality. SINUSES: No acute abnormality. SOFT TISSUES AND SKULL: No acute soft tissue abnormality. No skull fracture. Findings conveyed to Dr. Vanessa via pager at 9:05 PM IMPRESSION: 1. No acute intracranial abnormality. Electronically signed by: Gilmore Molt MD 02/12/2024 09:06 PM EST RP Workstation: [id number]     Procedures   Medications Ordered in the ED  ferrous sulfate  tablet 325 mg (has no administration in time range)  gabapentin  (NEURONTIN ) capsule 300 mg (300 mg Oral Given 02/12/24 2251)  levothyroxine  (SYNTHROID ) tablet 50 mcg (has no administration in time range)   stroke: early stages of recovery book (has no administration in time range)  acetaminophen  (TYLENOL ) tablet 650 mg (has no administration in time range)    Or  acetaminophen  (TYLENOL ) 160 MG/5ML solution 650 mg (has no administration in time range)    Or  acetaminophen  (TYLENOL ) suppository 650 mg (has no administration in time range)  enoxaparin  (LOVENOX ) injection 40 mg (has no administration in time range)                                    Medical Decision Making Amount and/or Complexity of Data Reviewed Labs: ordered. Radiology: ordered.  Risk Decision regarding hospitalization.    Casey Black is a 63 y.o. female with a past medical history significant for colon cancer, previous mall bowel obstruction with right colostomy after partial colectomy, hypertension, previous complete heart block, and Port-A-Cath placement for chemotherapy treatments who  presents with left-sided numbness and chest discomfort.  According to patient, at 5 PM she started having symptoms while in the kitchen and  noticed left arm numbness and tingling.  Initially she said she was having left-sided chest discomfort that she has had in the past with this.  I spoke to EMS on the radio who described the symptoms and we agreed to hold on code stroke activation in the field due to her similarity to previous symptoms by report and the chest discomfort and left arm primary symptoms.  After arrival when I saw the patient, she was actually describing more neurologic deficits than initially described.  Patient is reporting left face numbness, left arm numbness, and left leg numbness.  No reported weakness.  No dizziness or vision changes or speech changes.  She is reporting some subtle left chest pressure behind her left breast that she has had in the past.  She reports a neuropathy so this feels different.  She denies abdominal pain at this time.  Denies any nausea, vomiting, constipation, diarrhea, or urinary changes.  She does not have bowel movements from her rectum.  On my exam she does indeed have numbness in her left lower face, left arm and left leg compared to the right.  She did not have sensory deficit in her forehead.  Pupils are symmetric and reactive with normal extract movements.  Symmetric strength.  No tenderness on her extremities but just tenderness on her left chest.  Ostomy appears in place without significant abdominal tenderness.  I did not hear bowel sounds.  As her symptoms began within the last 4-1/2 hours and she has unilateral neurologic symptoms, we will activate a code stroke.  Neurologist at the bedside quickly to help guide management.  Of note, code stroke was accidentally activated on the patient in the room next-door but this was corrected by neurology.  Anticipate reassessment after workup to determine disposition.  Due to the liver metastasis will add  hepatic function and ammonia and due to her left side chest discomfort we will add on troponins as well.  Anticipate admission for further management.  9:10 PM Neurology suspect patient is having a small stroke.  They recommended MRI brain with and without contrast due to her cancer history to make sure there is not some metastasis that would bleed.  They will leave recommendations and we will admit for further management.      Final diagnoses:  Numbness  Left sided numbness    Clinical Impression: 1. Numbness   2. Left sided numbness     Disposition: Admit  This note was prepared with assistance of Dragon voice recognition software. Occasional wrong-word or sound-a-like substitutions may have occurred due to the inherent limitations of voice recognition software.       Rayce Brahmbhatt, Lonni PARAS, MD 02/12/24 [phone]

## 2024-02-12 NOTE — ED Notes (Signed)
 Pt back from CT

## 2024-02-12 NOTE — ED Notes (Addendum)
 This RN responded to MRI techs message regarding patient.  RN notified them that pt is fully oriented, no drips, no O2, and she thinks she'll be ok in the machine.

## 2024-02-13 ENCOUNTER — Observation Stay (HOSPITAL_COMMUNITY)

## 2024-02-13 DIAGNOSIS — I6523 Occlusion and stenosis of bilateral carotid arteries: Secondary | ICD-10-CM | POA: Diagnosis not present

## 2024-02-13 DIAGNOSIS — G959 Disease of spinal cord, unspecified: Secondary | ICD-10-CM | POA: Diagnosis not present

## 2024-02-13 DIAGNOSIS — I10 Essential (primary) hypertension: Secondary | ICD-10-CM

## 2024-02-13 DIAGNOSIS — C799 Secondary malignant neoplasm of unspecified site: Secondary | ICD-10-CM | POA: Diagnosis not present

## 2024-02-13 DIAGNOSIS — R200 Anesthesia of skin: Secondary | ICD-10-CM | POA: Diagnosis present

## 2024-02-13 DIAGNOSIS — G459 Transient cerebral ischemic attack, unspecified: Secondary | ICD-10-CM | POA: Diagnosis not present

## 2024-02-13 DIAGNOSIS — I6782 Cerebral ischemia: Secondary | ICD-10-CM | POA: Diagnosis not present

## 2024-02-13 DIAGNOSIS — R079 Chest pain, unspecified: Secondary | ICD-10-CM | POA: Diagnosis not present

## 2024-02-13 DIAGNOSIS — C787 Secondary malignant neoplasm of liver and intrahepatic bile duct: Secondary | ICD-10-CM

## 2024-02-13 DIAGNOSIS — M4802 Spinal stenosis, cervical region: Secondary | ICD-10-CM | POA: Diagnosis not present

## 2024-02-13 DIAGNOSIS — C189 Malignant neoplasm of colon, unspecified: Secondary | ICD-10-CM | POA: Diagnosis not present

## 2024-02-13 DIAGNOSIS — Z87891 Personal history of nicotine dependence: Secondary | ICD-10-CM

## 2024-02-13 DIAGNOSIS — R29818 Other symptoms and signs involving the nervous system: Secondary | ICD-10-CM | POA: Diagnosis not present

## 2024-02-13 DIAGNOSIS — M5021 Other cervical disc displacement,  high cervical region: Secondary | ICD-10-CM | POA: Diagnosis not present

## 2024-02-13 DIAGNOSIS — E785 Hyperlipidemia, unspecified: Secondary | ICD-10-CM | POA: Diagnosis not present

## 2024-02-13 DIAGNOSIS — M50223 Other cervical disc displacement at C6-C7 level: Secondary | ICD-10-CM | POA: Diagnosis not present

## 2024-02-13 DIAGNOSIS — I6389 Other cerebral infarction: Secondary | ICD-10-CM | POA: Diagnosis not present

## 2024-02-13 LAB — ECHOCARDIOGRAM COMPLETE
Area-P 1/2: 2.66 cm2
S' Lateral: 2.9 cm

## 2024-02-13 LAB — LIPID PANEL
Cholesterol: 166 mg/dL (ref 0–200)
HDL: 72 mg/dL (ref >40)
LDL Cholesterol: 80 mg/dL (ref 0–99)
Total CHOL/HDL Ratio: 2.3 ratio
Triglycerides: 70 mg/dL (ref <150)
VLDL: 14 mg/dL (ref 0–40)

## 2024-02-13 LAB — HIV ANTIBODY (ROUTINE TESTING W REFLEX): HIV Screen 4th Generation wRfx: NONREACTIVE

## 2024-02-13 LAB — AMMONIA: Ammonia: 19 umol/L (ref 9–35)

## 2024-02-13 LAB — TROPONIN I (HIGH SENSITIVITY): Troponin I (High Sensitivity): 7 ng/L (ref <18)

## 2024-02-13 MED ORDER — ASPIRIN 81 MG PO TBEC: 81.0000 mg | DELAYED_RELEASE_TABLET | Freq: Every day | ORAL | 12 refills | Status: AC

## 2024-02-13 MED ORDER — CLOPIDOGREL BISULFATE 75 MG PO TABS: 75.0000 mg | ORAL_TABLET | Freq: Every day | ORAL | 0 refills | Status: DC

## 2024-02-13 MED ORDER — HYDROCHLOROTHIAZIDE 12.5 MG PO TABS
12.5000 mg | ORAL_TABLET | Freq: Every day | ORAL | Status: DC
  Administered 2024-02-13 – 2024-02-14 (×2): 12.5 mg via ORAL
  Filled 2024-02-13 (×2): qty 1

## 2024-02-13 MED ORDER — ISOSORBIDE MONONITRATE ER 30 MG PO TB24
30.0000 mg | ORAL_TABLET | Freq: Every day | ORAL | Status: DC
  Administered 2024-02-13 – 2024-02-14 (×2): 30 mg via ORAL
  Filled 2024-02-13 (×2): qty 1

## 2024-02-13 MED ORDER — GADOBUTROL 1 MMOL/ML IV SOLN
10.0000 mL | Freq: Once | INTRAVENOUS | Status: AC | PRN
  Administered 2024-02-13: 10 mL via INTRAVENOUS

## 2024-02-13 MED ORDER — HYDRALAZINE HCL 25 MG PO TABS
25.0000 mg | ORAL_TABLET | Freq: Three times a day (TID) | ORAL | Status: DC
  Administered 2024-02-13 – 2024-02-14 (×3): 25 mg via ORAL
  Filled 2024-02-13 (×3): qty 1

## 2024-02-13 MED ORDER — AMLODIPINE BESYLATE 10 MG PO TABS
10.0000 mg | ORAL_TABLET | Freq: Every day | ORAL | Status: DC
  Administered 2024-02-13 – 2024-02-14 (×2): 10 mg via ORAL
  Filled 2024-02-13: qty 2
  Filled 2024-02-13: qty 1

## 2024-02-13 MED ORDER — CLOPIDOGREL BISULFATE 75 MG PO TABS
75.0000 mg | ORAL_TABLET | Freq: Every day | ORAL | Status: DC
  Administered 2024-02-13 – 2024-02-14 (×2): 75 mg via ORAL
  Filled 2024-02-13 (×2): qty 1

## 2024-02-13 MED ORDER — IOHEXOL 350 MG/ML SOLN
75.0000 mL | Freq: Once | INTRAVENOUS | Status: AC | PRN
  Administered 2024-02-13: 75 mL via INTRAVENOUS

## 2024-02-13 MED ORDER — ASPIRIN 81 MG PO TBEC
81.0000 mg | DELAYED_RELEASE_TABLET | Freq: Every day | ORAL | Status: DC
  Administered 2024-02-13 – 2024-02-14 (×2): 81 mg via ORAL
  Filled 2024-02-13 (×2): qty 1

## 2024-02-13 NOTE — ED Notes (Signed)
 Pt at bedside

## 2024-02-13 NOTE — Progress Notes (Signed)
   02/13/24 1159  TOC Brief Assessment  Insurance and Status Reviewed  Patient has primary care physician Yes  Home environment has been reviewed From home c/daughter  Prior level of function: Independent  Prior/Current Home Services No current home services  Social Drivers of Health Review SDOH reviewed no interventions necessary  Readmission risk has been reviewed Yes  Transition of care needs no transition of care needs at this time   Pt is independent at home and drives herself.  Current DME: walker  Outpatient physical therapy referral sent, AVS updated.  [facility] at [facility]. [address] [place],  [place]  [address] [phone]

## 2024-02-13 NOTE — Evaluation (Signed)
 Occupational Therapy Evaluation and Discharge Patient Details Name: Casey Black MRN: [id number] DOB: [birth year] Today's Date: 02/13/2024   History of Present Illness   The pt is a 64 yo female presenting 11/9 with L-sided chest pressure and L-sided numbness. MRI neg for acute infarct or mets, CT abdomen showed multiple liver mets, CT angio showed severe bilateral ICA stenosis (70% on L and 50% on R). PMH includes: stage IV colon cancer, SBO with R colostomy, HTN, complete heart block.     Clinical Impressions Pt is functioning independently. She is well aware of safety considerations given her upper and lower extremity neuropathy. No further OT needs.      If plan is discharge home, recommend the following:         Functional Status Assessment   Patient has not had a recent decline in their functional status     Equipment Recommendations   None recommended by OT     Recommendations for Other Services         Precautions/Restrictions   Precautions Recall of Precautions/Restrictions: Intact Restrictions Weight Bearing Restrictions Per Provider Order: No     Mobility Bed Mobility Overal bed mobility: Independent                  Transfers Overall transfer level: Independent Equipment used: None                      Balance Overall balance assessment: Needs assistance   Sitting balance-Leahy Scale: Normal       Standing balance-Leahy Scale: Good                             ADL either performed or assessed with clinical judgement   ADL Overall ADL's : Independent                                             Vision Baseline Vision/History: 1 Wears glasses Ability to See in Adequate Light: 0 Adequate Patient Visual Report: No change from baseline       Perception         Praxis         Pertinent Vitals/Pain Pain Assessment Pain Assessment: No/denies pain     Extremity/Trunk  Assessment Upper Extremity Assessment Upper Extremity Assessment: Right hand dominant;RUE deficits/detail;LUE deficits/detail RUE Sensation: history of peripheral neuropathy LUE Deficits / Details: mild imparments in coordination and pt reports decreased sensation compared to R despite chronic neuropathy in both UE LUE Sensation: history of peripheral neuropathy LUE Coordination: decreased fine motor;decreased gross motor   Lower Extremity Assessment Lower Extremity Assessment: Defer to PT evaluation RLE Deficits / Details: 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, reports no change in sensation, grossly 4+/5 RLE Sensation: history of peripheral neuropathy LLE Deficits / Details: 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, reports slightly decreased sensation compared to R, grossly 4+/5 LLE Sensation: decreased light touch;history of peripheral neuropathy LLE Coordination: decreased fine motor   Cervical / Trunk Assessment Cervical / Trunk Assessment: Normal Cervical / Trunk Exceptions: colostomy   Communication Communication Communication: No apparent difficulties   Cognition Arousal: Alert Behavior During Therapy: WFL for tasks assessed/performed Cognition: No apparent impairments  Following commands: Intact       Cueing  General Comments   Cueing Techniques: Verbal cues  VSS on RA   Exercises     Shoulder Instructions      Home Living Family/patient expects to be discharged to:: Private residence Living Arrangements: Children;Other relatives Available Help at Discharge: Family;Available PRN/intermittently Type of Home: Apartment Home Access: Level entry     Home Layout: One level     Bathroom Shower/Tub: Chief Strategy Officer: Standard     Home Equipment: Agricultural Consultant (2 wheels)          Prior Functioning/Environment Prior Level of Function : Independent/Modified Independent;Driving;Working/employed              Mobility Comments: independent, no falls or DME ADLs Comments: 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), independent, manages meds and colostomy    OT Problem List:     OT Treatment/Interventions:        OT Goals(Current goals can be found in the care plan section)       OT Frequency:       Co-evaluation              AM-PAC OT 6 Clicks Daily Activity     Outcome Measure Help from another person eating meals?: None Help from another person taking care of personal grooming?: None Help from another person toileting, which includes using toliet, bedpan, or urinal?: None Help from another person bathing (including washing, rinsing, drying)?: None Help from another person to put on and taking off regular upper body clothing?: None Help from another person to put on and taking off regular lower body clothing?: None 6 Click Score: 24   End of Session    Activity Tolerance: Patient tolerated treatment well Patient left: in bed;with call bell/phone within reach;Other (comment) (MD)  OT Visit Diagnosis: Muscle weakness (generalized) (M62.81)                Time: 8946-8884 OT Time Calculation (min): 22 min Charges:  OT General Charges $OT Visit: 1 Visit OT Evaluation $OT Eval Low Complexity: 1 Low  Casey Black, Casey Black Acute Rehabilitation Services Office: [phone]   Casey Black 02/13/2024, 11:27 AM

## 2024-02-13 NOTE — Evaluation (Signed)
 Physical Therapy Evaluation Patient Details Name: Casey Black MRN: [id number] DOB: 03/16/1961 Today's Date: 02/13/2024  History of Present Illness  The pt is a 63 yo female presenting 11/9 with L-sided chest pressure and L-sided numbness. MRI neg for acute infarct or mets, CT abdomen showed multiple liver mets, CT angio showed severe bilateral ICA stenosis (70% on L and 50% on R). PMH includes: stage IV colon cancer, SBO with R colostomy, HTN, complete heart block.   Clinical Impression  Pt in bed upon arrival of PT, agreeable to evaluation at this time. 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 The pt presents with reports of mild increase in numbness on LLE and L hand compared to her baseline bilateral neuropathy in legs and hands, and deficits in dynamic balance with reduction in visual input and with narrow BOS. Pt able to complete all transfers and hallway ambulation without assistance or need for DME, but could benefit from OPPT to progress functional balance and reduce risk of falls. Pt with no further acute PT needs at this time, she is safe to return home with intermittent assist once medically cleared. Thank you for the consult.      If plan is discharge home, recommend the following: Assistance with cooking/housework;Assist for transportation   Can travel by private vehicle        Equipment Recommendations None recommended by PT  Recommendations for Other Services       Functional Status Assessment Patient has had a recent decline in their functional status and demonstrates the ability to make significant improvements in function in a reasonable and predictable amount of time.     Precautions / Restrictions Precautions Precautions: Fall Recall of Precautions/Restrictions: Intact Restrictions Weight Bearing Restrictions Per Provider Order: No       Mobility  Bed Mobility Overal bed mobility: Independent                  Transfers Overall transfer level: Independent Equipment used: None               General transfer comment: supervision in session for safety and line management, stable without UE support    Ambulation/Gait Ambulation/Gait assistance: Contact guard assist, Supervision Gait Distance (Feet): 200 Feet Assistive device: None Gait Pattern/deviations: Step-through pattern, Decreased stride length Gait velocity: decreased Gait velocity interpretation: <1.31 ft/sec, indicative of household ambulator   General Gait Details: pt with slightly slowed gait, no overt LOB, mild sway. tolerated balance testing without assist needed  Modified Rankin (Stroke Patients Only) Modified Rankin (Stroke Patients Only) Pre-Morbid Rankin Score: No symptoms Modified Rankin: Slight disability     Balance Overall balance assessment: Needs assistance Sitting-balance support: No upper extremity supported, Feet supported Sitting balance-Leahy Scale: Normal     Standing balance support: No upper extremity supported, During functional activity Standing balance-Leahy Scale: Good       Tandem Stance - Right Leg: 5 Tandem Stance - Left Leg: 3 Rhomberg - Eyes Opened: 15 Rhomberg - Eyes Closed: 15 (increased sway (A-P) but managed with hip strategies) High level balance activites: Backward walking, Direction changes, Turns, Head turns High Level Balance Comments: increased sway but no UE support Standardized Balance Assessment Standardized Balance Assessment : Dynamic Gait Index   Dynamic Gait Index Level Surface: Normal Change in Gait Speed: Normal Gait with Horizontal Head Turns: Mild Impairment Gait with Vertical Head  Turns: Mild Impairment Gait and Pivot Turn: Mild Impairment Step Over Obstacle: Mild Impairment Step Around Obstacles: Normal Steps: Mild Impairment Total Score: 19       Pertinent  Vitals/Pain Pain Assessment Pain Assessment: No/denies pain    Home Living Family/patient expects to be discharged to:: Private residence Living Arrangements: Children;Other relatives (daughter and 44 yo granddaughter) Available Help at Discharge: Family;Available PRN/intermittently (daughter works during the day) Type of Home: Apartment Home Access: Level entry       Home Layout: One level Home Equipment: Agricultural Consultant (2 wheels)      Prior Function Prior Level of Function : Independent/Modified Independent;Driving;Working/employed             Mobility Comments: independent, no falls or DME ADLs Comments: works part time as financial risk analyst at daycare center (4hours/day), independent, manages meds and colostomy     Extremity/Trunk Assessment   Upper Extremity Assessment Upper Extremity Assessment: LUE deficits/detail;Right hand dominant LUE Deficits / Details: mild imparments in coordination and pt reports decreased sensation compared to R despite chronic neuropathy in both UE LUE Sensation: decreased light touch;history of peripheral neuropathy LUE Coordination: decreased fine motor;decreased gross motor    Lower Extremity Assessment Lower Extremity Assessment: RLE deficits/detail;LLE deficits/detail RLE Deficits / Details: 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, reports no change in sensation, grossly 4+/5 RLE Sensation: history of peripheral neuropathy LLE Deficits / Details: 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, reports slightly decreased sensation compared to R, grossly 4+/5 LLE Sensation: decreased light touch;history of peripheral neuropathy LLE Coordination: decreased fine motor    Cervical / Trunk Assessment Cervical / Trunk Assessment: Normal;Other exceptions Cervical / Trunk Exceptions: colostomy  Communication   Communication Communication: No apparent difficulties    Cognition Arousal: Alert Behavior During Therapy: WFL for tasks assessed/performed   PT -  Cognitive impairments: No apparent impairments                         Following commands: Intact       Cueing Cueing Techniques: Verbal cues     General Comments General comments (skin integrity, edema, etc.): VSS on RA    Exercises     Assessment/Plan    PT Assessment All further PT needs can be met in the next venue of care  PT Problem List Decreased balance;Decreased mobility       PT Treatment Interventions      PT Goals (Current goals can be found in the Care Plan section)  Acute Rehab PT Goals Patient Stated Goal: return to work and home with family PT Goal Formulation: With patient Time For Goal Achievement: 02/27/24 Potential to Achieve Goals: Good     AM-PAC PT 6 Clicks Mobility  Outcome Measure Help needed turning from your back to your side while in a flat bed without using bedrails?: None Help needed moving from lying on your back to sitting on the side of a flat bed without using bedrails?: None Help needed moving to and from a bed to a chair (including a wheelchair)?: None Help needed standing up from a chair using your arms (e.g., wheelchair or bedside chair)?: None Help needed to walk in hospital room?: A Little Help needed climbing 3-5 steps with a railing? : A Little 6 Click Score: 22    End of Session Equipment Utilized During Treatment: Gait belt Activity Tolerance: Patient tolerated treatment well Patient left: in bed;with call bell/phone within reach Nurse Communication: Mobility status PT Visit  Diagnosis: Unsteadiness on feet (R26.81)    Time: 9065-9044 PT Time Calculation (min) (ACUTE ONLY): 21 min   Charges:   PT Evaluation $PT Eval Low Complexity: 1 Low   PT General Charges $$ ACUTE PT VISIT: 1 Visit         Izetta Call, PT, DPT   Acute Rehabilitation Department Office [phone] Secure Chat Communication Preferred   Izetta JULIANNA Call 02/13/2024, 11:02 AM

## 2024-02-13 NOTE — Progress Notes (Addendum)
 STROKE TEAM PROGRESS NOTE   INTERIM HISTORY/SUBJECTIVE Was working in the kitchen and had an episode of numbness. She had this happen about a month ago as well. Has not had any neck imaging, will ordered. Currently no deficits noted. Hemodynamically stable. Agreeable to DAPT.   OBJECTIVE  CBC    Component Value Date/Time   WBC 5.2 02/12/2024 2040   RBC 3.86 (L) 02/12/2024 2040   HGB 12.2 02/12/2024 2047   HGB 11.0 (L) 02/02/2024 1218   HCT 36.0 02/12/2024 2047   PLT 154 02/12/2024 2040   PLT 167 02/02/2024 1218   MCV 95.1 02/12/2024 2040   MCH 29.8 02/12/2024 2040   MCHC 31.3 02/12/2024 2040   RDW 14.5 02/12/2024 2040   LYMPHSABS 2.3 02/12/2024 2040   MONOABS 0.5 02/12/2024 2040   EOSABS 0.2 02/12/2024 2040   BASOSABS 0.0 02/12/2024 2040    BMET    Component Value Date/Time   NA 137 02/12/2024 2047   K 4.8 02/12/2024 2047   CL 103 02/12/2024 2047   CO2 22 02/12/2024 2040   GLUCOSE 87 02/12/2024 2047   BUN 22 02/12/2024 2047   CREATININE 1.20 (H) 02/12/2024 2047   CREATININE 1.10 (H) 02/02/2024 1218   CALCIUM  9.7 02/12/2024 2040   GFRNONAA 55 (L) 02/12/2024 2040   GFRNONAA 56 (L) 02/02/2024 1218    IMAGING past 24 hours ECHOCARDIOGRAM COMPLETE Result Date: 02/13/2024    ECHOCARDIOGRAM REPORT   Patient Name:   RAINIE CRENSHAW Date of Exam: 02/13/2024 Medical Rec #:  [id number]         Height:       67.5 in Accession #:    [id number]        Weight:       238.0 lb Date of Birth:  1960-08-11        BSA:          2.189 m Patient Age:    63 years          BP:           138/62 mmHg Patient Gender: F                 HR:           58 bpm. Exam Location:  Inpatient Procedure: 2D Echo, Cardiac Doppler and Color Doppler (Both Spectral and Color            Flow Doppler were utilized during procedure). Indications:    Stroke  History:        Patient has prior history of Echocardiogram examinations, most                 recent 11/04/2021. Risk Factors:Hypertension.  Sonographer:     Carmelita Hartshorn RDCS, FE, PE Referring Phys: [id number] VASUNDHRA RATHORE IMPRESSIONS  1. Left ventricular ejection fraction, by estimation, is 65 to 70%. The left ventricle has normal function. The left ventricle has no regional wall motion abnormalities. There is mild concentric left ventricular hypertrophy. Left ventricular diastolic parameters are consistent with Grade I diastolic dysfunction (impaired relaxation).  2. Right ventricular systolic function is normal. The right ventricular size is normal.  3. The mitral valve is degenerative. Trivial mitral valve regurgitation. No evidence of mitral stenosis.  4. The aortic valve is tricuspid. Aortic valve regurgitation is not visualized. Aortic valve sclerosis/calcification is present, without any evidence of aortic stenosis.  5. The inferior vena cava is normal in size with greater than 50% respiratory variability, suggesting right atrial  pressure of 3 mmHg. Conclusion(s)/Recommendation(s): No intracardiac source of embolism detected on this transthoracic study. Consider a transesophageal echocardiogram to exclude cardiac source of embolism if clinically indicated. FINDINGS  Left Ventricle: Left ventricular ejection fraction, by estimation, is 65 to 70%. The left ventricle has normal function. The left ventricle has no regional wall motion abnormalities. The left ventricular internal cavity size was normal in size. There is  mild concentric left ventricular hypertrophy. Left ventricular diastolic parameters are consistent with Grade I diastolic dysfunction (impaired relaxation). Normal left ventricular filling pressure. Right Ventricle: The right ventricular size is normal. No increase in right ventricular wall thickness. Right ventricular systolic function is normal. Left Atrium: Left atrial size was normal in size. Right Atrium: Right atrial size was normal in size. Pericardium: There is no evidence of pericardial effusion. Mitral Valve: The mitral valve is  degenerative in appearance. There is moderate thickening of the mitral valve leaflet(s). There is mild calcification of the mitral valve leaflet(s). Mild mitral annular calcification. Trivial mitral valve regurgitation. No evidence of mitral valve stenosis. Tricuspid Valve: The tricuspid valve is normal in structure. Tricuspid valve regurgitation is trivial. No evidence of tricuspid stenosis. Aortic Valve: The aortic valve is tricuspid. Aortic valve regurgitation is not visualized. Aortic valve sclerosis/calcification is present, without any evidence of aortic stenosis. Pulmonic Valve: The pulmonic valve was normal in structure. Pulmonic valve regurgitation is trivial. No evidence of pulmonic stenosis. Aorta: The aortic root is normal in size and structure. Venous: The inferior vena cava is normal in size with greater than 50% respiratory variability, suggesting right atrial pressure of 3 mmHg. IAS/Shunts: No atrial level shunt detected by color flow Doppler.  LEFT VENTRICLE PLAX 2D LVIDd:         4.40 cm   Diastology LVIDs:         2.90 cm   LV e' medial:    6.42 cm/s LV PW:         1.10 cm   LV E/e' medial:  10.4 LV IVS:        1.20 cm   LV e' lateral:   5.33 cm/s LVOT diam:     2.12 cm   LV E/e' lateral: 12.5 LV SV:         97 LV SV Index:   44 LVOT Area:     3.53 cm  RIGHT VENTRICLE RV S prime:     13.80 cm/s LEFT ATRIUM             Index        RIGHT ATRIUM           Index LA diam:        3.57 cm 1.63 cm/m   RA Area:     12.00 cm LA Vol (A2C):   63.7 ml 29.09 ml/m  RA Volume:   25.20 ml  11.51 ml/m LA Vol (A4C):   80.0 ml 36.54 ml/m LA Biplane Vol: 72.4 ml 33.07 ml/m  AORTIC VALVE LVOT Vmax:   123.00 cm/s LVOT Vmean:  79.700 cm/s LVOT VTI:    0.276 m  AORTA Ao Root diam: 2.78 cm Ao Asc diam:  2.37 cm MITRAL VALVE               TRICUSPID VALVE MV Area (PHT): 2.66 cm    TR Peak grad:   40.2 mmHg MV Decel Time: 285 msec    TR Vmax:        317.00 cm/s MV E velocity: 66.60 cm/s  MV A velocity: 98.00 cm/s   SHUNTS MV E/A ratio:  0.68        Systemic VTI:  0.28 m                            Systemic Diam: 2.12 cm Wilbert Bihari MD Electronically signed by Wilbert Bihari MD Signature Date/Time: 02/13/2024/9:19:02 AM    Final    CT ANGIO HEAD NECK W WO CM Result Date: 02/13/2024 EXAM: CTA HEAD AND NECK WITHOUT AND WITH 02/13/2024 03:23:38 AM TECHNIQUE: CTA of the head and neck was performed without and with the administration of intravenous contrast. Multiplanar 2D and/or 3D reformatted images are provided for review. Automated exposure control, iterative reconstruction, and/or weight based adjustment of the mA/kV was utilized to reduce the radiation dose to as low as reasonably achievable. Stenosis of the internal carotid arteries measured using NASCET criteria. COMPARISON: None available CLINICAL HISTORY: Neuro deficit, acute, stroke suspected FINDINGS: AORTIC ARCH AND ARCH VESSELS: No dissection or arterial injury. No significant stenosis of the brachiocephalic or subclavian arteries. CERVICAL CAROTID ARTERIES: Approximately 50% stenosis of the right internal carotid artery at the skull base. Approximately 70% stenosis of the right internal carotid artery at the skull base. CERVICAL VERTEBRAL ARTERIES: Patent vertebral arteries. Moderate right proximal V2 verebtral artery stenosis. 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 LUNGS AND MEDIASTINUM: Unremarkable. SOFT TISSUES: No acute abnormality. BONES: No acute abnormality. ANTERIOR CIRCULATION: Severe bilateral cavernous ICA stenosis. No significant stenosis of the anterior cerebral arteries. No significant stenosis of the middle cerebral arteries. No aneurysm. POSTERIOR CIRCULATION: No significant stenosis of the posterior cerebral arteries. No significant stenosis of the basilar artery. No significant stenosis of the vertebral arteries. No aneurysm. OTHER: No dural venous sinus thrombosis on this non-dedicated study. IMPRESSION: 1. No large vessel  occlusion. 2. Severe bilateral cavernous ICA stenosis. 3. Stenoses of the ICAs at the skull base, 70% on the left and 50% on the right. 4. Moderate right proximal V2 verebtral artery stenosis. 5. 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 Electronically signed by: Gilmore Molt MD 02/13/2024 03:51 AM EST RP Workstation: [id number]   MR BRAIN W WO CONTRAST Result Date: 02/13/2024 EXAM: MRI Brain With and Without Contrast 02/13/2024 01:18:47 AM TECHNIQUE: Multiplanar multisequence MRI of the head/brain was performed with and without the administration of intravenous contrast. COMPARISON: CT head 12/13/2023 CLINICAL HISTORY: Metastatic disease evaluation; Neuro deficit, acute, stroke suspected FINDINGS: BRAIN AND VENTRICLES: No acute infarct. No acute intracranial hemorrhage. No mass effect or midline shift. No hydrocephalus. Normal flow voids. No mass or abnormal enhancement. Small remote right thalamic lacunar infarct and mild chronic microvascular ischemic change. ORBITS: No acute abnormality. SINUSES: No acute abnormality. BONES AND SOFT TISSUES: Normal bone marrow signal and enhancement. No acute soft tissue abnormality. IMPRESSION: 1. No evidence of acute intracranial abnormality or metastatic disease. Electronically signed by: Gilmore Molt MD 02/13/2024 02:09 AM EST RP Workstation: [id number]   DG CHEST PORT 1 VIEW Result Date: 02/12/2024 EXAM: 1 VIEW(S) XRAY OF THE CHEST 02/12/2024 11:44:38 PM COMPARISON: Chest CT 01/26/2024 CLINICAL HISTORY: Chest pain. FINDINGS: LINES, TUBES AND DEVICES: Right chest port catheter tip is over the SVC. LUNGS AND PLEURA: No focal pulmonary opacity. No pulmonary edema. No pleural effusion. No pneumothorax. HEART AND MEDIASTINUM: Cardiac silhouette appears borderline enlarged, a new finding. BONES AND SOFT TISSUES: No acute osseous abnormality. IMPRESSION: 1. No acute findings. 2. New borderline enlarged cardiac  silhouette; correlate  clinically. Electronically signed by: Greig Pique MD 02/12/2024 11:58 PM EST RP Workstation: [id number]   CT HEAD CODE STROKE WO CONTRAST (LKW 0-4.5h, LVO 0-24h) Result Date: 02/12/2024 EXAM: CT HEAD WITHOUT CONTRAST 02/12/2024 08:57:06 PM TECHNIQUE: CT of the head was performed without the administration of intravenous contrast. Automated exposure control, iterative reconstruction, and/or weight based adjustment of the mA/kV was utilized to reduce the radiation dose to as low as reasonably achievable. COMPARISON: None available. CLINICAL HISTORY: Neuro deficit, acute, stroke suspected. FINDINGS: BRAIN AND VENTRICLES: No acute hemorrhage. No evidence of acute infarct. No hydrocephalus. No extra-axial collection. No mass effect or midline shift. ORBITS: No acute abnormality. SINUSES: No acute abnormality. SOFT TISSUES AND SKULL: No acute soft tissue abnormality. No skull fracture. Findings conveyed to Dr. Vanessa via pager at 9:05 PM IMPRESSION: 1. No acute intracranial abnormality. Electronically signed by: Gilmore Molt MD 02/12/2024 09:06 PM EST RP Workstation: [id number]    Vitals:   02/13/24 0830 02/13/24 0845 02/13/24 0900 02/13/24 0915  BP: 137/62 123/65 106/85 129/62  Pulse:      Resp: (!) 22 17 (!) 21 18  Temp:    98.7 F (37.1 C)  TempSrc:    Oral  SpO2:    99%     PHYSICAL EXAM General:  Alert, well-nourished, well-developed patient in no acute distress Psych:  Mood and affect appropriate for situation CV: Regular rate and rhythm on monitor Respiratory:  Regular, unlabored respirations on room air GI: Abdomen soft and nontender, ostomy in place   NEURO:  Mental Status: AA&Ox3, patient is able to give clear and coherent history Speech/Language: speech is without dysarthria or aphasia.  Naming, repetition, fluency, and comprehension intact.  Cranial Nerves:  II: PERRL. Visual fields full.  III, IV, VI: EOMI. Eyelids elevate symmetrically.  V: Sensation is intact to  light touch and symmetrical to face.  VII: Face is symmetrical resting and smiling VIII: hearing intact to voice. IX, X: Palate elevates symmetrically. Phonation is normal.  KP:Dynloizm shrug 5/5. XII: tongue is midline without fasciculations. Motor: 5/5 strength to all muscle groups tested.  Tone: is normal and bulk is normal Sensation- Intact to light touch bilaterally. Extinction absent to light touch to DSS.   Coordination: FTN intact bilaterally, HKS: no ataxia in BLE.No drift.  Gait- deferred  Most Recent NIH 0     ASSESSMENT/PLAN  [name] is a 63 y.o. female with history of HTN, prior SBO, stage 4 terminal colon cancer s/p chemo and colostomy, anemia who presents with L sided numbness.  NIH on Admission 1.  Episodic left-sided numbness, suspect TIA, but need to rule out cervical spinal cord pathology Previous episode of numbness with chest pressure about a month ago and her oncologist did an EKG that was normal  CT no acute abnormality CTA head & neck- No large vessel occlusion. Severe bilateral cavernous ICA stenosis. Stenoses of the ICAs at the skull base, 70% on the left and 50% on the right. Moderate right proximal V2 verebtral artery stenosis. 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 MRI with and without no evidence of acute intracranial abnormality or metastatic  C spine MRI w wo pending 2D Echo EF 65-70% LDL 80 HgbA1c 5.4 UDS negative VTE prophylaxis - SCDs No antithrombotic prior to admission, now on aspirin 81 mg daily and clopidogrel 75 mg daily for 3 weeks and then ASA 81mg  alone. Therapy recommendations:  No follow up needed  Disposition:  Pending  Hypertension Home meds:  Norvasc , hydrochlorothiazide , imdur  Stable now Long-term BP goal normotensive  Hyperlipidemia Home meds:  None LDL 80, goal < 70 No statin recommended due to colon cancer with liver metastasis and LDL not far from goal  Other Stroke Risk  Factors Obesity, There is no height or weight on file to calculate BMI., BMI >/= 30 associated with increased stroke risk, recommend weight loss, diet and exercise as appropriate  Former smoker quit 5 years ago  Other Active Problems Stage IV Colon cancer with liver metastasis status post colectomy and on chemotherapy Hypothyroidism Chronic pain and neuropathy   Hospital day # 0  Patient seen and examined by NP/APP with MD. MD to update note as needed.   Jorene Last, DNP, FNP-BC [facility] Pager: [phone]  ATTENDING NOTE: I reviewed above note and agree with the assessment and plan. Pt was seen and examined.   No family at bedside.  Patient lying bed, neuro intact, left-sided numbness has resolved.  No focal deficits.  Patient stated that she had episode 1 month ago and yesterday with left hand numbness tingling spreading to left arm left neck left trunk and down to left lower extremity.  Lasting overnight.  MRI no acute infarct.  Will check MRI C-spine to rule out spinal cord pathology.  Suspected TIA given bilateral ICA siphon severe atherosclerosis/stenosis.  On DAPT.  No statin given liver metastasis.  Stroke risk factor modification.  Will follow  For detailed assessment and plan, please refer to above as I have made changes wherever appropriate.   Ary Cummins, MD PhD Stroke Neurology 02/13/2024 5:53 PM    To contact Stroke Continuity provider, please refer to [email or web address]. After hours, contact General Neurology

## 2024-02-13 NOTE — Care Management Obs Status (Signed)
 MEDICARE OBSERVATION STATUS NOTIFICATION   Patient Details  Name: Porter Nakama MRN: [id number] Date of Birth: Feb 16, 1961   Medicare Observation Status Notification Given:  Yes    Nena LITTIE Coffee, RN 02/13/2024, 12:38 PM

## 2024-02-13 NOTE — Progress Notes (Signed)
 SLP Cancellation Note  Patient Details Name: Casey Black MRN: [id number] DOB: [birth year]   Cancelled treatment:       Reason Eval/Treat Not Completed: SLP screened. MRI is negative and PT/OT report no cognitive-linguistic deficits. No needs identified, will sign off.    Damien Blumenthal, M.A., CCC-SLP Speech Language Pathology, Acute Rehabilitation Services  Secure Chat preferred [phone]  02/13/2024, 1:54 PM

## 2024-02-13 NOTE — Progress Notes (Signed)
 PROGRESS NOTE    Casey Black  FMW:[id number] DOB: 10/22/1960 DOA: 02/12/2024 PCP: [name], [name], MD    Brief Narrative:  63 year old female with history of stage IV colon cancer with liver and nodal metastasis on chemotherapy, previous small bowel obstruction is status post partial colectomy with right colostomy, hypertension, hypothyroidism, anxiety presented with sudden onset of chest discomfort and left-sided numbness.  This happened when she was standing in her kitchen.  She did have these episodes in the past and was asked to see a cardiologist.  Last chemo was on 10/30. On arrival to the ER code stroke was activated.  Blood pressure was stable.  CT head without any acute findings.  She did not have any neurological deficit, not started on tPA.  Admitted with neurology recommendations.  Subjective: Patient seen and examined.  In my morning rounds, she denied any complaints.  She however tells me that she had these episodes in the past.  She also has neuropathy due to chemotherapy. She was able to mobilize around.  Assessment & Plan:   Left-sided tingling and numbness TIA Clinical findings, left-sided tingling numbness.  Improved. CT head findings, no acute findings. MRI of the brain with and without contrast, no acute stroke.  Negative for any space-occupying lesion or metastatic lesion. CT angiogram head and neck, no large vessel occlusion.  Severe bilateral cavernous ICA stenosis. 2D echocardiogram, normal ejection fraction.  No valvular abnormality. Antiplatelet therapy, none at home.  Neurology recommended aspirin and Plavix for 3 weeks then aspirin alone. LDL 80.  Well-controlled.  Holding to start statin.  Patient on chemo. Hemoglobin A1c, 5.4.  Does not need any treatment. Physical therapy has cleared the patient for discharge with outpatient PT. Neurology following.  They recommended MRI of the cervical spine given unilateral numbness with history of  metastatic colon cancer.  Essential hypertension: Blood pressure is stable.  Able to resume home medications including amlodipine  hydrochlorothiazide  and Imdur .  Currently no indication for permissive hypertension.  Stage IV colon cancer, under chemotherapy.  Actively followed by oncology.  Hypothyroidism: Stable on replacement.  Chronic pain and neuropathy: On symptomatic treatment.  Gabapentin .     DVT prophylaxis: SCDs   Code Status: Full code Family Communication: None at the bedside Disposition Plan: Status is: Observation The patient remains OBS appropriate and will d/c before 2 midnights.     Consultants:  Neurology  Procedures:  None  Antimicrobials:  None     Objective: Vitals:   02/13/24 0915 02/13/24 1154 02/13/24 1215 02/13/24 1219  BP: 129/62  (!) 154/70 (!) 154/70  Pulse:  (!) 55 (!) 57 (!) 57  Resp: 18 14 19 17   Temp: 98.7 F (37.1 C)   97.6 F (36.4 C)  TempSrc: Oral   Oral  SpO2: 99% 98% 96% 99%   No intake or output data in the 24 hours ending 02/13/24 1327 There were no vitals filed for this visit.  Examination:  General exam: Appears calm and comfortable.  Pleasant interaction. Respiratory system: Clear to auscultation. Respiratory effort normal.  Port-A-Cath right chest wall. Cardiovascular system: S1 & S2 heard, RRR. No JVD, murmurs, rubs, gallops or clicks. No pedal edema. Gastrointestinal system: Abdomen is nondistended, soft and nontender. No organomegaly or masses felt. Normal bowel sounds heard. Patient has a colostomy bag. Central nervous system: Alert and oriented.  No obvious neurological deficits.     Data Reviewed: I have personally reviewed following labs and imaging studies  CBC: Recent Labs  Lab  02/12/24 2040 02/12/24 2047  WBC 5.2  --   NEUTROABS 2.3  --   HGB 11.5* 12.2  HCT 36.7 36.0  MCV 95.1  --   PLT 154  --    Basic Metabolic Panel: Recent Labs  Lab 02/12/24 2040 02/12/24 2047  NA 136 137  K 4.4  4.8  CL 103 103  CO2 22  --   GLUCOSE 94 87  BUN 18 22  CREATININE 1.13* 1.20*  CALCIUM  9.7  --    GFR: Estimated Creatinine Clearance: 61.3 mL/min (A) (by C-G formula based on SCr of 1.2 mg/dL (H)). Liver Function Tests: Recent Labs  Lab 02/12/24 2040  AST 24  ALT 16  ALKPHOS 92  BILITOT 0.3  PROT 7.0  ALBUMIN 3.3*   No results for input(s): LIPASE, AMYLASE in the last 168 hours. Recent Labs  Lab 02/12/24 2313  AMMONIA 19   Coagulation Profile: Recent Labs  Lab 02/12/24 2040  INR 1.0   Cardiac Enzymes: No results for input(s): CKTOTAL, CKMB, CKMBINDEX, TROPONINI in the last 168 hours. BNP (last 3 results) No results for input(s): PROBNP in the last 8760 hours. HbA1C: Recent Labs    02/12/24 2313  HGBA1C 5.4   CBG: Recent Labs  Lab 02/12/24 2041  GLUCAP 91   Lipid Profile: Recent Labs    02/12/24 2313  CHOL 166  HDL 72  LDLCALC 80  TRIG 70  CHOLHDL 2.3   Thyroid  Function Tests: No results for input(s): TSH, T4TOTAL, FREET4, T3FREE, THYROIDAB in the last 72 hours. Anemia Panel: No results for input(s): VITAMINB12, FOLATE, FERRITIN, TIBC, IRON, RETICCTPCT in the last 72 hours. Sepsis Labs: No results for input(s): PROCALCITON, LATICACIDVEN in the last 168 hours.  No results found for this or any previous visit (from the past 240 hours).       Radiology Studies: ECHOCARDIOGRAM COMPLETE Result Date: 02/13/2024    ECHOCARDIOGRAM REPORT   Patient Name:   Casey Black Date of Exam: 02/13/2024 Medical Rec #:  [id number]         Height:       67.5 in Accession #:    [id number]        Weight:       238.0 lb Date of Birth:  [birth year]        BSA:          2.189 m Patient Age:    62 years          BP:           138/62 mmHg Patient Gender: F                 HR:           58 bpm. Exam Location:  Inpatient Procedure: 2D Echo, Cardiac Doppler and Color Doppler (Both Spectral and Color            Flow Doppler were  utilized during procedure). Indications:    Stroke  History:        Patient has prior history of Echocardiogram examinations, most                 recent 11/04/2021. Risk Factors:Hypertension.  Sonographer:    Carmelita Hartshorn RDCS, FE, PE Referring Phys: [id number] VASUNDHRA RATHORE IMPRESSIONS  1. Left ventricular ejection fraction, by estimation, is 65 to 70%. The left ventricle has normal function. The left ventricle has no regional wall motion abnormalities. There is mild concentric left ventricular hypertrophy. Left ventricular diastolic  parameters are consistent with Grade I diastolic dysfunction (impaired relaxation).  2. Right ventricular systolic function is normal. The right ventricular size is normal.  3. The mitral valve is degenerative. Trivial mitral valve regurgitation. No evidence of mitral stenosis.  4. The aortic valve is tricuspid. Aortic valve regurgitation is not visualized. Aortic valve sclerosis/calcification is present, without any evidence of aortic stenosis.  5. The inferior vena cava is normal in size with greater than 50% respiratory variability, suggesting right atrial pressure of 3 mmHg. Conclusion(s)/Recommendation(s): No intracardiac source of embolism detected on this transthoracic study. Consider a transesophageal echocardiogram to exclude cardiac source of embolism if clinically indicated. FINDINGS  Left Ventricle: Left ventricular ejection fraction, by estimation, is 65 to 70%. The left ventricle has normal function. The left ventricle has no regional wall motion abnormalities. The left ventricular internal cavity size was normal in size. There is  mild concentric left ventricular hypertrophy. Left ventricular diastolic parameters are consistent with Grade I diastolic dysfunction (impaired relaxation). Normal left ventricular filling pressure. Right Ventricle: The right ventricular size is normal. No increase in right ventricular wall thickness. Right ventricular systolic function is  normal. Left Atrium: Left atrial size was normal in size. Right Atrium: Right atrial size was normal in size. Pericardium: There is no evidence of pericardial effusion. Mitral Valve: The mitral valve is degenerative in appearance. There is moderate thickening of the mitral valve leaflet(s). There is mild calcification of the mitral valve leaflet(s). Mild mitral annular calcification. Trivial mitral valve regurgitation. No evidence of mitral valve stenosis. Tricuspid Valve: The tricuspid valve is normal in structure. Tricuspid valve regurgitation is trivial. No evidence of tricuspid stenosis. Aortic Valve: The aortic valve is tricuspid. Aortic valve regurgitation is not visualized. Aortic valve sclerosis/calcification is present, without any evidence of aortic stenosis. Pulmonic Valve: The pulmonic valve was normal in structure. Pulmonic valve regurgitation is trivial. No evidence of pulmonic stenosis. Aorta: The aortic root is normal in size and structure. Venous: The inferior vena cava is normal in size with greater than 50% respiratory variability, suggesting right atrial pressure of 3 mmHg. IAS/Shunts: No atrial level shunt detected by color flow Doppler.  LEFT VENTRICLE PLAX 2D LVIDd:         4.40 cm   Diastology LVIDs:         2.90 cm   LV e' medial:    6.42 cm/s LV PW:         1.10 cm   LV E/e' medial:  10.4 LV IVS:        1.20 cm   LV e' lateral:   5.33 cm/s LVOT diam:     2.12 cm   LV E/e' lateral: 12.5 LV SV:         97 LV SV Index:   44 LVOT Area:     3.53 cm  RIGHT VENTRICLE RV S prime:     13.80 cm/s LEFT ATRIUM             Index        RIGHT ATRIUM           Index LA diam:        3.57 cm 1.63 cm/m   RA Area:     12.00 cm LA Vol (A2C):   63.7 ml 29.09 ml/m  RA Volume:   25.20 ml  11.51 ml/m LA Vol (A4C):   80.0 ml 36.54 ml/m LA Biplane Vol: 72.4 ml 33.07 ml/m  AORTIC VALVE LVOT Vmax:   123.00  cm/s LVOT Vmean:  79.700 cm/s LVOT VTI:    0.276 m  AORTA Ao Root diam: 2.78 cm Ao Asc diam:  2.37 cm  MITRAL VALVE               TRICUSPID VALVE MV Area (PHT): 2.66 cm    TR Peak grad:   40.2 mmHg MV Decel Time: 285 msec    TR Vmax:        317.00 cm/s MV E velocity: 66.60 cm/s MV A velocity: 98.00 cm/s  SHUNTS MV E/A ratio:  0.68        Systemic VTI:  0.28 m                            Systemic Diam: 2.12 cm Wilbert Bihari MD Electronically signed by Wilbert Bihari MD Signature Date/Time: 02/13/2024/9:19:02 AM    Final    CT ANGIO HEAD NECK W WO CM Result Date: 02/13/2024 EXAM: CTA HEAD AND NECK WITHOUT AND WITH 02/13/2024 03:23:38 AM TECHNIQUE: CTA of the head and neck was performed without and with the administration of intravenous contrast. Multiplanar 2D and/or 3D reformatted images are provided for review. Automated exposure control, iterative reconstruction, and/or weight based adjustment of the mA/kV was utilized to reduce the radiation dose to as low as reasonably achievable. Stenosis of the internal carotid arteries measured using NASCET criteria. COMPARISON: None available CLINICAL HISTORY: Neuro deficit, acute, stroke suspected FINDINGS: AORTIC ARCH AND ARCH VESSELS: No dissection or arterial injury. No significant stenosis of the brachiocephalic or subclavian arteries. CERVICAL CAROTID ARTERIES: Approximately 50% stenosis of the right internal carotid artery at the skull base. Approximately 70% stenosis of the right internal carotid artery at the skull base. CERVICAL VERTEBRAL ARTERIES: Patent vertebral arteries. Moderate right proximal V2 verebtral artery stenosis. 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 LUNGS AND MEDIASTINUM: Unremarkable. SOFT TISSUES: No acute abnormality. BONES: No acute abnormality. ANTERIOR CIRCULATION: Severe bilateral cavernous ICA stenosis. No significant stenosis of the anterior cerebral arteries. No significant stenosis of the middle cerebral arteries. No aneurysm. POSTERIOR CIRCULATION: No significant stenosis of the posterior cerebral arteries. No significant  stenosis of the basilar artery. No significant stenosis of the vertebral arteries. No aneurysm. OTHER: No dural venous sinus thrombosis on this non-dedicated study. IMPRESSION: 1. No large vessel occlusion. 2. Severe bilateral cavernous ICA stenosis. 3. Stenoses of the ICAs at the skull base, 70% on the left and 50% on the right. 4. Moderate right proximal V2 verebtral artery stenosis. 5. 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 Electronically signed by: Gilmore Molt MD 02/13/2024 03:51 AM EST RP Workstation: [id number]   MR BRAIN W WO CONTRAST Result Date: 02/13/2024 EXAM: MRI Brain With and Without Contrast 02/13/2024 01:18:47 AM TECHNIQUE: Multiplanar multisequence MRI of the head/brain was performed with and without the administration of intravenous contrast. COMPARISON: CT head 12/13/2023 CLINICAL HISTORY: Metastatic disease evaluation; Neuro deficit, acute, stroke suspected FINDINGS: BRAIN AND VENTRICLES: No acute infarct. No acute intracranial hemorrhage. No mass effect or midline shift. No hydrocephalus. Normal flow voids. No mass or abnormal enhancement. Small remote right thalamic lacunar infarct and mild chronic microvascular ischemic change. ORBITS: No acute abnormality. SINUSES: No acute abnormality. BONES AND SOFT TISSUES: Normal bone marrow signal and enhancement. No acute soft tissue abnormality. IMPRESSION: 1. No evidence of acute intracranial abnormality or metastatic disease. Electronically signed by: Gilmore Molt MD 02/13/2024 02:09 AM EST RP Workstation: [id number]   DG  CHEST PORT 1 VIEW Result Date: 02/12/2024 EXAM: 1 VIEW(S) XRAY OF THE CHEST 02/12/2024 11:44:38 PM COMPARISON: Chest CT 01/26/2024 CLINICAL HISTORY: Chest pain. FINDINGS: LINES, TUBES AND DEVICES: Right chest port catheter tip is over the SVC. LUNGS AND PLEURA: No focal pulmonary opacity. No pulmonary edema. No pleural effusion. No pneumothorax. HEART AND MEDIASTINUM: Cardiac silhouette  appears borderline enlarged, a new finding. BONES AND SOFT TISSUES: No acute osseous abnormality. IMPRESSION: 1. No acute findings. 2. New borderline enlarged cardiac silhouette; correlate clinically. Electronically signed by: Greig Pique MD 02/12/2024 11:58 PM EST RP Workstation: [id number]   CT HEAD CODE STROKE WO CONTRAST (LKW 0-4.5h, LVO 0-24h) Result Date: 02/12/2024 EXAM: CT HEAD WITHOUT CONTRAST 02/12/2024 08:57:06 PM TECHNIQUE: CT of the head was performed without the administration of intravenous contrast. Automated exposure control, iterative reconstruction, and/or weight based adjustment of the mA/kV was utilized to reduce the radiation dose to as low as reasonably achievable. COMPARISON: None available. CLINICAL HISTORY: Neuro deficit, acute, stroke suspected. FINDINGS: BRAIN AND VENTRICLES: No acute hemorrhage. No evidence of acute infarct. No hydrocephalus. No extra-axial collection. No mass effect or midline shift. ORBITS: No acute abnormality. SINUSES: No acute abnormality. SOFT TISSUES AND SKULL: No acute soft tissue abnormality. No skull fracture. Findings conveyed to Dr. Vanessa via pager at 9:05 PM IMPRESSION: 1. No acute intracranial abnormality. Electronically signed by: Gilmore Molt MD 02/12/2024 09:06 PM EST RP Workstation: [id number]        Scheduled Meds:   stroke: early stages of recovery book   Does not apply Once   amLODipine   10 mg Oral Daily   aspirin EC  81 mg Oral Daily   clopidogrel  75 mg Oral Daily   ferrous sulfate   325 mg Oral BID WC   gabapentin   300 mg Oral BID   hydrALAZINE   25 mg Oral Q8H   hydrochlorothiazide   12.5 mg Oral Daily   isosorbide  mononitrate  30 mg Oral Daily   levothyroxine   50 mcg Oral Daily   Continuous Infusions:   LOS: 0 days    Time spent: 40 minutes    Renato Applebaum, MD [facility]

## 2024-02-13 NOTE — Discharge Instructions (Signed)
  A referral was made for outpatient physical therapy (the clinic will call you to schedule an appointment): [facility]. [address] [place],  [place]  [address] [phone]

## 2024-02-14 ENCOUNTER — Ambulatory Visit

## 2024-02-14 DIAGNOSIS — Z87891 Personal history of nicotine dependence: Secondary | ICD-10-CM | POA: Diagnosis not present

## 2024-02-14 DIAGNOSIS — G459 Transient cerebral ischemic attack, unspecified: Secondary | ICD-10-CM | POA: Diagnosis not present

## 2024-02-14 DIAGNOSIS — G959 Disease of spinal cord, unspecified: Secondary | ICD-10-CM | POA: Diagnosis not present

## 2024-02-14 DIAGNOSIS — C787 Secondary malignant neoplasm of liver and intrahepatic bile duct: Secondary | ICD-10-CM | POA: Diagnosis not present

## 2024-02-14 DIAGNOSIS — C189 Malignant neoplasm of colon, unspecified: Secondary | ICD-10-CM | POA: Diagnosis not present

## 2024-02-14 DIAGNOSIS — I10 Essential (primary) hypertension: Secondary | ICD-10-CM | POA: Diagnosis not present

## 2024-02-14 DIAGNOSIS — I6389 Other cerebral infarction: Secondary | ICD-10-CM | POA: Diagnosis not present

## 2024-02-14 DIAGNOSIS — E785 Hyperlipidemia, unspecified: Secondary | ICD-10-CM | POA: Diagnosis not present

## 2024-02-14 DIAGNOSIS — R200 Anesthesia of skin: Secondary | ICD-10-CM | POA: Diagnosis not present

## 2024-02-14 NOTE — Progress Notes (Signed)
 STROKE TEAM PROGRESS NOTE   INTERIM HISTORY/SUBJECTIVE No acute event overnight. MRI C-spine done showed no spinal cord compression.   OBJECTIVE  CBC    Component Value Date/Time   WBC 5.2 02/12/2024 2040   RBC 3.86 (L) 02/12/2024 2040   HGB 12.2 02/12/2024 2047   HGB 11.0 (L) 02/02/2024 1218   HCT 36.0 02/12/2024 2047   PLT 154 02/12/2024 2040   PLT 167 02/02/2024 1218   MCV 95.1 02/12/2024 2040   MCH 29.8 02/12/2024 2040   MCHC 31.3 02/12/2024 2040   RDW 14.5 02/12/2024 2040   LYMPHSABS 2.3 02/12/2024 2040   MONOABS 0.5 02/12/2024 2040   EOSABS 0.2 02/12/2024 2040   BASOSABS 0.0 02/12/2024 2040    BMET    Component Value Date/Time   NA 137 02/12/2024 2047   K 4.8 02/12/2024 2047   CL 103 02/12/2024 2047   CO2 22 02/12/2024 2040   GLUCOSE 87 02/12/2024 2047   BUN 22 02/12/2024 2047   CREATININE 1.20 (H) 02/12/2024 2047   CREATININE 1.10 (H) 02/02/2024 1218   CALCIUM  9.7 02/12/2024 2040   GFRNONAA 55 (L) 02/12/2024 2040   GFRNONAA 56 (L) 02/02/2024 1218    IMAGING past 24 hours MR CERVICAL SPINE W WO CONTRAST Result Date: 02/13/2024 CLINICAL DATA:  Initial evaluation for left arm and neck numbness. Metastatic disease evaluation. EXAM: MRI CERVICAL SPINE WITHOUT AND WITH CONTRAST TECHNIQUE: Multiplanar and multiecho pulse sequences of the cervical spine, to include the craniocervical junction and cervicothoracic junction, were obtained without and with intravenous contrast. CONTRAST:  10mL GADAVIST GADOBUTROL 1 MMOL/ML IV SOLN COMPARISON:  None Available. FINDINGS: Alignment: Straightening of the normal cervical lordosis. No listhesis. Vertebrae: Vertebral body height maintained without acute or chronic fracture. Mildly decreased T1 signal intensity within the visualized bone marrow, nonspecific, but most commonly related to anemia, smoking, or obesity. Subcentimeter benign hemangioma noted within the T1 vertebral body. No worrisome osseous lesions or evidence for  metastatic disease. Mild degenerative reactive endplate changes present about the C4-5 interspace. No other abnormal marrow edema or enhancement. Cord: Normal signal and morphology.  No abnormal enhancement. Posterior Fossa, vertebral arteries, paraspinal tissues: Chronic pontine lacunar infarct noted. Visualized brain and posterior fossa otherwise unremarkable. Paraspinous soft tissues within normal limits. Normal flow voids seen within the vertebral arteries bilaterally. Disc levels: C2-C3: Normal interspace. Mild left-sided facet hypertrophy. No canal or foraminal stenosis. C3-C4: Mild disc bulge with uncovertebral spurring. Mild left-sided facet hypertrophy. No significant spinal stenosis. Foramina remain patent. C4-C5: Degenerative intervertebral disc space narrowing. Right paracentral disc osteophyte complex indents the right ventral thecal sac (series 8, image 22). Mild cord flattening without cord signal changes. No significant spinal stenosis. Superimposed uncovertebral spurring without significant foraminal encroachment. C5-C6: Degenerative intervertebral disc space narrowing. Diffuse disc bulge with bilateral uncovertebral spurring. No significant spinal stenosis. Foramina remain adequately patent. C6-C7: Diffuse disc bulge with bilateral uncovertebral spurring. No spinal stenosis. Foramina remain adequately patent. C7-T1: Small left foraminal disc osteophyte complex (series 5, image 11). Mild bilateral facet hypertrophy. No spinal stenosis. Mild left C8 foraminal narrowing. Right neural foramina remains patent. IMPRESSION: 1. No evidence for metastatic disease within the cervical spine. 2. Small left foraminal disc osteophyte complex at C7-T1, potentially irritating the left C8 nerve root. 3. Right paracentral disc osteophyte complex at C4-5 with resultant mild cord flattening, but no cord signal changes or significant stenosis. 4. Additional mild noncompressive disc bulging at C3-4 through C6-7 without  significant stenosis or impingement. 5. Chronic pontine lacunar  infarct. Electronically Signed   By: Morene Hoard M.D.   On: 02/13/2024 21:27    Vitals:   02/14/24 0114 02/14/24 0419 02/14/24 0638 02/14/24 0800  BP: (!) 145/65 (!) 148/59 (!) 148/59 (!) 129/56  Pulse: 70 71  73  Resp:    16  Temp: 98.2 F (36.8 C) 98.2 F (36.8 C)  98.2 F (36.8 C)  TempSrc:    Oral  SpO2: 94% 97%  98%     PHYSICAL EXAM General:  Alert, well-nourished, well-developed patient in no acute distress Psych:  Mood and affect appropriate for situation CV: Regular rate and rhythm on monitor Respiratory:  Regular, unlabored respirations on room air GI: Abdomen soft and nontender, ostomy in place   NEURO:  Mental Status: AA&Ox3, patient is able to give clear and coherent history Speech/Language: speech is without dysarthria or aphasia.  Naming, repetition, fluency, and comprehension intact.  Cranial Nerves:  II: PERRL. Visual fields full.  III, IV, VI: EOMI. Eyelids elevate symmetrically.  V: Sensation is intact to light touch and symmetrical to face.  VII: Face is symmetrical resting and smiling VIII: hearing intact to voice. IX, X: Palate elevates symmetrically. Phonation is normal.  KP:Dynloizm shrug 5/5. XII: tongue is midline without fasciculations. Motor: 5/5 strength to all muscle groups tested.  Tone: is normal and bulk is normal Sensation- Intact to light touch bilaterally. Extinction absent to light touch to DSS.   Coordination: FTN intact bilaterally, HKS: no ataxia in BLE.No drift.  Gait- deferred  Most Recent NIH 0     ASSESSMENT/PLAN  [name] is a 63 y.o. female with history of HTN, prior SBO, stage 4 terminal colon cancer s/p chemo and colostomy, anemia who presents with L sided numbness.  NIH on Admission 1.  Possoble TIA  Previous episode of numbness with chest pressure about a month ago and her oncologist did an EKG that was normal  CT no acute  abnormality CTA head & neck- No large vessel occlusion. Severe bilateral cavernous ICA stenosis. Stenoses of the ICAs at the skull base, 70% on the left and 50% on the right. Moderate right proximal V2 verebtral artery stenosis. 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 MRI with and without no evidence of acute intracranial abnormality or metastatic  C spine MRI w wo no spinal cord pathology 2D Echo EF 65-70% LDL 80 HgbA1c 5.4 UDS negative VTE prophylaxis - SCDs No antithrombotic prior to admission, now on aspirin 81 mg daily and clopidogrel 75 mg daily for 3 weeks and then ASA 81mg  alone. Therapy recommendations:  No follow up needed  Disposition:  home today   Hypertension Home meds:  Norvasc , hydrochlorothiazide , imdur  Stable now Long-term BP goal normotensive  Hyperlipidemia Home meds:  None LDL 80, goal < 70 No statin recommended due to colon cancer with liver metastasis and LDL not far from goal  Other Stroke Risk Factors Obesity, There is no height or weight on file to calculate BMI., BMI >/= 30 associated with increased stroke risk, recommend weight loss, diet and exercise as appropriate  Former smoker quit 5 years ago  Other Active Problems Stage IV Colon cancer with liver metastasis status post colectomy and on chemotherapy Hypothyroidism Chronic pain and neuropathy   Hospital day # 0  Neurology will sign off. Please call with questions. Pt will follow up with stroke clinic NP at [facility] in about 4 weeks. Thanks for the consult.   Ary Cummins, MD PhD Stroke Neurology  02/14/2024 10:56 AM    To contact Stroke Continuity provider, please refer to [email or web address]. After hours, contact General Neurology

## 2024-02-14 NOTE — Plan of Care (Signed)

## 2024-02-14 NOTE — Discharge Summary (Signed)
 Physician Discharge Summary  Casey Black FMW:[id number] DOB: [birth year] DOA: 02/12/2024  PCP: [name], [name], MD  Admit date: 02/12/2024 Discharge date: 02/14/2024  Admitted From: Home Disposition: Home  Recommendations for Outpatient Follow-up:  Follow up with PCP in 1-2 weeks Neurology to schedule outpatient follow-up in the stroke clinic.  Home Health: N/A.  Outpatient PT. Equipment/Devices: N/A  Discharge Condition: Stable CODE STATUS: Full code Diet recommendation: Low-salt diet, nutritional supplements  Discharge summary: 63 year old female with history of stage IV colon cancer with liver and nodal metastasis on chemotherapy, previous small bowel obstruction status post partial colectomy with right colostomy, hypertension, hypothyroidism and anxiety presented with sudden onset of chest discomfort and left-sided numbness.  This happened when she was standing in her kitchen.  She did have these episodes in the past and was asked to see a cardiologist.  Last chemo was on 10/30. On arrival to the ER code stroke was activated.  Blood pressure was stable.  CT head without any acute findings.  She did not have any neurological deficit, not started on tPA.  Admitted with neurology recommendations and underwent a stroke workup.  Treated for following conditions.   # Left-sided tingling and numbness: Presumed TIA. Clinical findings, left-sided tingling numbness.  Improved. CT head findings, no acute findings. MRI of the brain with and without contrast, no acute stroke.  Negative for any space-occupying lesion or metastatic lesion. MRI of the cervical spine, no significant neurological compromise. CT angiogram head and neck, no large vessel occlusion.  Severe bilateral cavernous ICA stenosis. 2D echocardiogram, normal ejection fraction.  No valvular abnormality. Antiplatelet therapy, none at home.  Neurology recommended aspirin and Plavix for 3 weeks then aspirin  alone. LDL 80.  Well-controlled.  Not starting on statin due to patient being on chemotherapy. Hemoglobin A1c, 5.4.  Does not need any treatment. Physical therapy has cleared the patient for discharge with outpatient PT. Medically stable.  Discharging home on above therapy with outpatient physical therapy.  Neurology to schedule outpatient follow-up.   Essential hypertension: Blood pressure is stable.  Able to resume home medications including amlodipine  hydrochlorothiazide  and Imdur .  Currently no indication for permissive hypertension.   Stage IV colon cancer, under chemotherapy.  Actively followed by oncology.   Hypothyroidism: Stable on replacement.   Chronic pain and neuropathy: On symptomatic treatment.  Gabapentin .  Stable to discharge home today.   Discharge Diagnoses:  Principal Problem:   Left sided numbness Active Problems:   Hypertension   Adenocarcinoma of colon metastatic to liver [facility])   Hypothyroidism   Chest pain   Numbness and tingling of left arm and leg    Discharge Instructions  Discharge Instructions     Ambulatory referral to Neurology   Complete by: As directed    Follow up with stroke clinic NP at [facility] in about 4-6 weeks. Thanks.   Ambulatory referral to Physical Therapy   Complete by: As directed    Diet - low sodium heart healthy   Complete by: As directed    Increase activity slowly   Complete by: As directed       Allergies as of 02/14/2024   No Known Allergies      Medication List     TAKE these medications    acetaminophen  325 MG tablet Commonly known as: TYLENOL  Take 2 tablets (650 mg total) by mouth every 6 (six) hours as needed.   amLODipine  10 MG tablet Commonly known as: NORVASC  TAKE 1 TABLET BY MOUTH EVERY  DAY   aspirin EC 81 MG tablet Take 1 tablet (81 mg total) by mouth daily. Swallow whole.   clopidogrel 75 MG tablet Commonly known as: PLAVIX Take 1 tablet (75 mg total) by mouth daily for 21 days.    dexamethasone  4 MG tablet Commonly known as: DECADRON  Take 2 tablets (8 mg total) by mouth daily. Start the day after chemotherapy for 2 days. Take with food.   ferrous sulfate  325 (65 FE) MG tablet TAKE 1 TABLET BY MOUTH TWICE A DAY WITH FOOD   gabapentin  300 MG capsule Commonly known as: NEURONTIN  TAKE 1 CAPSULE BY MOUTH TWICE A DAY   hydrALAZINE  25 MG tablet Commonly known as: APRESOLINE  TAKE 1 TABLET BY MOUTH EVERY 8 HOURS   hydrochlorothiazide  12.5 MG tablet Commonly known as: HYDRODIURIL  TAKE 1 TABLET BY MOUTH EVERY DAY   isosorbide  mononitrate 30 MG 24 hr tablet Commonly known as: IMDUR  TAKE 1 TABLET BY MOUTH EVERY DAY   levothyroxine  50 MCG tablet Commonly known as: SYNTHROID  Take 1 tablet (50 mcg total) by mouth daily. What changed: how much to take   lidocaine -prilocaine  cream Commonly known as: EMLA  APPLY 1 APPLICATION TOPICALLY AS NEEDED. What changed:  how much to take reasons to take this   loperamide  2 MG capsule Commonly known as: IMODIUM  Take 2 tabs by mouth with first loose stool, then 1 tab with each additional loose stool as needed. Do not exceed 8 tabs in a 24-hour period   ondansetron  8 MG tablet Commonly known as: ZOFRAN  Take 1 tablet (8 mg total) by mouth every 8 (eight) hours as needed for nausea, vomiting or refractory nausea / vomiting. Start on the third day after chemotherapy.   prochlorperazine  10 MG tablet Commonly known as: COMPAZINE  Take 1 tablet (10 mg total) by mouth every 6 (six) hours as needed for nausea or vomiting.   Vitamin D  (Ergocalciferol ) 1.25 MG (50000 UNIT) Caps capsule Commonly known as: DRISDOL  Take 1 capsule (50,000 Units total) by mouth every 7 (seven) days for 12 doses.        Follow-up Information     [facility]. Schedule an appointment as soon as possible for a visit in 1 month(s).   Specialty: Neurology Why: stroke clinic Contact information: [address] [place]  [address] [phone]               No Known Allergies  Consultations: Neurology   Procedures/Studies: MR CERVICAL SPINE W WO CONTRAST Result Date: 02/13/2024 CLINICAL DATA:  Initial evaluation for left arm and neck numbness. Metastatic disease evaluation. EXAM: MRI CERVICAL SPINE WITHOUT AND WITH CONTRAST TECHNIQUE: Multiplanar and multiecho pulse sequences of the cervical spine, to include the craniocervical junction and cervicothoracic junction, were obtained without and with intravenous contrast. CONTRAST:  10mL GADAVIST GADOBUTROL 1 MMOL/ML IV SOLN COMPARISON:  None Available. FINDINGS: Alignment: Straightening of the normal cervical lordosis. No listhesis. Vertebrae: Vertebral body height maintained without acute or chronic fracture. Mildly decreased T1 signal intensity within the visualized bone marrow, nonspecific, but most commonly related to anemia, smoking, or obesity. Subcentimeter benign hemangioma noted within the T1 vertebral body. No worrisome osseous lesions or evidence for metastatic disease. Mild degenerative reactive endplate changes present about the C4-5 interspace. No other abnormal marrow edema or enhancement. Cord: Normal signal and morphology.  No abnormal enhancement. Posterior Fossa, vertebral arteries, paraspinal tissues: Chronic pontine lacunar infarct noted. Visualized brain and posterior fossa otherwise unremarkable. Paraspinous soft tissues within normal limits.  Normal flow voids seen within the vertebral arteries bilaterally. Disc levels: C2-C3: Normal interspace. Mild left-sided facet hypertrophy. No canal or foraminal stenosis. C3-C4: Mild disc bulge with uncovertebral spurring. Mild left-sided facet hypertrophy. No significant spinal stenosis. Foramina remain patent. C4-C5: Degenerative intervertebral disc space narrowing. Right paracentral disc osteophyte complex indents the right ventral thecal sac (series 8, image 22). Mild cord  flattening without cord signal changes. No significant spinal stenosis. Superimposed uncovertebral spurring without significant foraminal encroachment. C5-C6: Degenerative intervertebral disc space narrowing. Diffuse disc bulge with bilateral uncovertebral spurring. No significant spinal stenosis. Foramina remain adequately patent. C6-C7: Diffuse disc bulge with bilateral uncovertebral spurring. No spinal stenosis. Foramina remain adequately patent. C7-T1: Small left foraminal disc osteophyte complex (series 5, image 11). Mild bilateral facet hypertrophy. No spinal stenosis. Mild left C8 foraminal narrowing. Right neural foramina remains patent. IMPRESSION: 1. No evidence for metastatic disease within the cervical spine. 2. Small left foraminal disc osteophyte complex at C7-T1, potentially irritating the left C8 nerve root. 3. Right paracentral disc osteophyte complex at C4-5 with resultant mild cord flattening, but no cord signal changes or significant stenosis. 4. Additional mild noncompressive disc bulging at C3-4 through C6-7 without significant stenosis or impingement. 5. Chronic pontine lacunar infarct. Electronically Signed   By: Morene Hoard M.D.   On: 02/13/2024 21:27   ECHOCARDIOGRAM COMPLETE Result Date: 02/13/2024    ECHOCARDIOGRAM REPORT   Patient Name:   ROLAND PRINE Date of Exam: 02/13/2024 Medical Rec #:  [id number]         Height:       67.5 in Accession #:    [id number]        Weight:       238.0 lb Date of Birth:  08-26-1960        BSA:          2.189 m Patient Age:    62 years          BP:           138/62 mmHg Patient Gender: F                 HR:           58 bpm. Exam Location:  Inpatient Procedure: 2D Echo, Cardiac Doppler and Color Doppler (Both Spectral and Color            Flow Doppler were utilized during procedure). Indications:    Stroke  History:        Patient has prior history of Echocardiogram examinations, most                 recent 11/04/2021. Risk  Factors:Hypertension.  Sonographer:    Carmelita Hartshorn RDCS, FE, PE Referring Phys: [id number] VASUNDHRA RATHORE IMPRESSIONS  1. Left ventricular ejection fraction, by estimation, is 65 to 70%. The left ventricle has normal function. The left ventricle has no regional wall motion abnormalities. There is mild concentric left ventricular hypertrophy. Left ventricular diastolic parameters are consistent with Grade I diastolic dysfunction (impaired relaxation).  2. Right ventricular systolic function is normal. The right ventricular size is normal.  3. The mitral valve is degenerative. Trivial mitral valve regurgitation. No evidence of mitral stenosis.  4. The aortic valve is tricuspid. Aortic valve regurgitation is not visualized. Aortic valve sclerosis/calcification is present, without any evidence of aortic stenosis.  5. The inferior vena cava is normal in size with greater than 50% respiratory variability, suggesting right atrial pressure of 3 mmHg. Conclusion(s)/Recommendation(s): No intracardiac  source of embolism detected on this transthoracic study. Consider a transesophageal echocardiogram to exclude cardiac source of embolism if clinically indicated. FINDINGS  Left Ventricle: Left ventricular ejection fraction, by estimation, is 65 to 70%. The left ventricle has normal function. The left ventricle has no regional wall motion abnormalities. The left ventricular internal cavity size was normal in size. There is  mild concentric left ventricular hypertrophy. Left ventricular diastolic parameters are consistent with Grade I diastolic dysfunction (impaired relaxation). Normal left ventricular filling pressure. Right Ventricle: The right ventricular size is normal. No increase in right ventricular wall thickness. Right ventricular systolic function is normal. Left Atrium: Left atrial size was normal in size. Right Atrium: Right atrial size was normal in size. Pericardium: There is no evidence of pericardial effusion.  Mitral Valve: The mitral valve is degenerative in appearance. There is moderate thickening of the mitral valve leaflet(s). There is mild calcification of the mitral valve leaflet(s). Mild mitral annular calcification. Trivial mitral valve regurgitation. No evidence of mitral valve stenosis. Tricuspid Valve: The tricuspid valve is normal in structure. Tricuspid valve regurgitation is trivial. No evidence of tricuspid stenosis. Aortic Valve: The aortic valve is tricuspid. Aortic valve regurgitation is not visualized. Aortic valve sclerosis/calcification is present, without any evidence of aortic stenosis. Pulmonic Valve: The pulmonic valve was normal in structure. Pulmonic valve regurgitation is trivial. No evidence of pulmonic stenosis. Aorta: The aortic root is normal in size and structure. Venous: The inferior vena cava is normal in size with greater than 50% respiratory variability, suggesting right atrial pressure of 3 mmHg. IAS/Shunts: No atrial level shunt detected by color flow Doppler.  LEFT VENTRICLE PLAX 2D LVIDd:         4.40 cm   Diastology LVIDs:         2.90 cm   LV e' medial:    6.42 cm/s LV PW:         1.10 cm   LV E/e' medial:  10.4 LV IVS:        1.20 cm   LV e' lateral:   5.33 cm/s LVOT diam:     2.12 cm   LV E/e' lateral: 12.5 LV SV:         97 LV SV Index:   44 LVOT Area:     3.53 cm  RIGHT VENTRICLE RV S prime:     13.80 cm/s LEFT ATRIUM             Index        RIGHT ATRIUM           Index LA diam:        3.57 cm 1.63 cm/m   RA Area:     12.00 cm LA Vol (A2C):   63.7 ml 29.09 ml/m  RA Volume:   25.20 ml  11.51 ml/m LA Vol (A4C):   80.0 ml 36.54 ml/m LA Biplane Vol: 72.4 ml 33.07 ml/m  AORTIC VALVE LVOT Vmax:   123.00 cm/s LVOT Vmean:  79.700 cm/s LVOT VTI:    0.276 m  AORTA Ao Root diam: 2.78 cm Ao Asc diam:  2.37 cm MITRAL VALVE               TRICUSPID VALVE MV Area (PHT): 2.66 cm    TR Peak grad:   40.2 mmHg MV Decel Time: 285 msec    TR Vmax:        317.00 cm/s MV E velocity: 66.60  cm/s MV A velocity: 98.00 cm/s  SHUNTS MV E/A ratio:  0.68        Systemic VTI:  0.28 m                            Systemic Diam: 2.12 cm Wilbert Bihari MD Electronically signed by Wilbert Bihari MD Signature Date/Time: 02/13/2024/9:19:02 AM    Final    CT ANGIO HEAD NECK W WO CM Result Date: 02/13/2024 EXAM: CTA HEAD AND NECK WITHOUT AND WITH 02/13/2024 03:23:38 AM TECHNIQUE: CTA of the head and neck was performed without and with the administration of intravenous contrast. Multiplanar 2D and/or 3D reformatted images are provided for review. Automated exposure control, iterative reconstruction, and/or weight based adjustment of the mA/kV was utilized to reduce the radiation dose to as low as reasonably achievable. Stenosis of the internal carotid arteries measured using NASCET criteria. COMPARISON: None available CLINICAL HISTORY: Neuro deficit, acute, stroke suspected FINDINGS: AORTIC ARCH AND ARCH VESSELS: No dissection or arterial injury. No significant stenosis of the brachiocephalic or subclavian arteries. CERVICAL CAROTID ARTERIES: Approximately 50% stenosis of the right internal carotid artery at the skull base. Approximately 70% stenosis of the right internal carotid artery at the skull base. CERVICAL VERTEBRAL ARTERIES: Patent vertebral arteries. Moderate right proximal V2 verebtral artery stenosis. 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 LUNGS AND MEDIASTINUM: Unremarkable. SOFT TISSUES: No acute abnormality. BONES: No acute abnormality. ANTERIOR CIRCULATION: Severe bilateral cavernous ICA stenosis. No significant stenosis of the anterior cerebral arteries. No significant stenosis of the middle cerebral arteries. No aneurysm. POSTERIOR CIRCULATION: No significant stenosis of the posterior cerebral arteries. No significant stenosis of the basilar artery. No significant stenosis of the vertebral arteries. No aneurysm. OTHER: No dural venous sinus thrombosis on this non-dedicated study.  IMPRESSION: 1. No large vessel occlusion. 2. Severe bilateral cavernous ICA stenosis. 3. Stenoses of the ICAs at the skull base, 70% on the left and 50% on the right. 4. Moderate right proximal V2 verebtral artery stenosis. 5. 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 Electronically signed by: Gilmore Molt MD 02/13/2024 03:51 AM EST RP Workstation: [id number]   MR BRAIN W WO CONTRAST Result Date: 02/13/2024 EXAM: MRI Brain With and Without Contrast 02/13/2024 01:18:47 AM TECHNIQUE: Multiplanar multisequence MRI of the head/brain was performed with and without the administration of intravenous contrast. COMPARISON: CT head 12/13/2023 CLINICAL HISTORY: Metastatic disease evaluation; Neuro deficit, acute, stroke suspected FINDINGS: BRAIN AND VENTRICLES: No acute infarct. No acute intracranial hemorrhage. No mass effect or midline shift. No hydrocephalus. Normal flow voids. No mass or abnormal enhancement. Small remote right thalamic lacunar infarct and mild chronic microvascular ischemic change. ORBITS: No acute abnormality. SINUSES: No acute abnormality. BONES AND SOFT TISSUES: Normal bone marrow signal and enhancement. No acute soft tissue abnormality. IMPRESSION: 1. No evidence of acute intracranial abnormality or metastatic disease. Electronically signed by: Gilmore Molt MD 02/13/2024 02:09 AM EST RP Workstation: [id number]   DG CHEST PORT 1 VIEW Result Date: 02/12/2024 EXAM: 1 VIEW(S) XRAY OF THE CHEST 02/12/2024 11:44:38 PM COMPARISON: Chest CT 01/26/2024 CLINICAL HISTORY: Chest pain. FINDINGS: LINES, TUBES AND DEVICES: Right chest port catheter tip is over the SVC. LUNGS AND PLEURA: No focal pulmonary opacity. No pulmonary edema. No pleural effusion. No pneumothorax. HEART AND MEDIASTINUM: Cardiac silhouette appears borderline enlarged, a new finding. BONES AND SOFT TISSUES: No acute osseous abnormality. IMPRESSION: 1. No acute findings. 2. New borderline enlarged cardiac  silhouette; correlate clinically. Electronically signed by:  Greig Pique MD 02/12/2024 11:58 PM EST RP Workstation: [id number]   CT HEAD CODE STROKE WO CONTRAST (LKW 0-4.5h, LVO 0-24h) Result Date: 02/12/2024 EXAM: CT HEAD WITHOUT CONTRAST 02/12/2024 08:57:06 PM TECHNIQUE: CT of the head was performed without the administration of intravenous contrast. Automated exposure control, iterative reconstruction, and/or weight based adjustment of the mA/kV was utilized to reduce the radiation dose to as low as reasonably achievable. COMPARISON: None available. CLINICAL HISTORY: Neuro deficit, acute, stroke suspected. FINDINGS: BRAIN AND VENTRICLES: No acute hemorrhage. No evidence of acute infarct. No hydrocephalus. No extra-axial collection. No mass effect or midline shift. ORBITS: No acute abnormality. SINUSES: No acute abnormality. SOFT TISSUES AND SKULL: No acute soft tissue abnormality. No skull fracture. Findings conveyed to Dr. Vanessa via pager at 9:05 PM IMPRESSION: 1. No acute intracranial abnormality. Electronically signed by: Gilmore Molt MD 02/12/2024 09:06 PM EST RP Workstation: [id number]   CT CHEST ABDOMEN PELVIS W CONTRAST Result Date: 01/28/2024 CLINICAL DATA:  Metastatic colon cancer, assess treatment response * Tracking Code: BO * EXAM: CT CHEST, ABDOMEN, AND PELVIS WITH CONTRAST TECHNIQUE: Multidetector CT imaging of the chest, abdomen and pelvis was performed following the standard protocol during bolus administration of intravenous contrast. RADIATION DOSE REDUCTION: This exam was performed according to the departmental dose-optimization program which includes automated exposure control, adjustment of the mA and/or kV according to patient size and/or use of iterative reconstruction technique. CONTRAST:  OMNIPAQUE  IOHEXOL  300 MG/ML  SOLN COMPARISON:  09/29/2023 FINDINGS: CT CHEST FINDINGS Cardiovascular: Right chest port catheter. Aortic atherosclerosis. Normal heart size.  Three-vessel coronary artery calcifications. No pericardial effusion. Mediastinum/Nodes: Unchanged enlarged pretracheal lymph nodes measuring up to 1.6 x 1.1 cm (series 2, image 24). Thyroid  gland, trachea, and esophagus demonstrate no significant findings. Lungs/Pleura: Mild centrilobular emphysema. Diffuse bilateral bronchial wall thickening. No pleural effusion or pneumothorax. Musculoskeletal: No chest wall abnormality. No acute osseous findings. CT ABDOMEN PELVIS FINDINGS Hepatobiliary: Interval enlargement of noncalcified hypodense metastasis in the central left lobe of the liver, hepatic segment IV, measuring 2.7 x 2.3 cm, previously 2.3 x 2.0 cm (series 2, image 62). Multiple additional calcified and noncalcified metastases throughout the liver are unchanged. Cholecystectomy. Unchanged mild postoperative biliary ductal dilatation. Pancreas: Unremarkable. No pancreatic ductal dilatation or surrounding inflammatory changes. Spleen: Normal in size without significant abnormality. Adrenals/Urinary Tract: Adrenal glands are unremarkable. Kidneys are normal, without renal calculi, solid lesion, or hydronephrosis. Bladder is unremarkable. Stomach/Bowel: Stomach is within normal limits. Appendix appears normal. No evidence of bowel wall thickening, distention, or inflammatory changes. Vascular/Lymphatic: Aortic atherosclerosis. No enlarged abdominal or pelvic lymph nodes. Reproductive: No mass or other abnormality. 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 Small fat containing bilateral inguinal hernias. No ascites. Musculoskeletal: No acute osseous findings. IMPRESSION: 1. Interval enlargement of noncalcified hypodense metastasis in the central left lobe of the liver, hepatic segment IV. Multiple additional calcified and noncalcified metastases throughout the liver are unchanged. 2. Unchanged enlarged pretracheal lymph nodes. 3. 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 4.  Emphysema and diffuse bilateral bronchial wall thickening. 5. Coronary artery disease. Aortic Atherosclerosis (ICD10-I70.0) and Emphysema (ICD10-J43.9). Electronically Signed   By: Marolyn JONETTA Jaksch M.D.   On: 01/28/2024 23:09   (Echo, Carotid, EGD, Colonoscopy, ERCP)    Subjective: Patient seen in the morning rounds.  She denies any complaints.  No more numbness or weakness.  Walking around independently.  Eager to go home.   Discharge Exam: Vitals:   02/14/24 0638 02/14/24 0800  BP: ROLLEN)  148/59 (!) 129/56  Pulse:  73  Resp:  16  Temp:  98.2 F (36.8 C)  SpO2:  98%   Vitals:   02/14/24 0114 02/14/24 0419 02/14/24 0638 02/14/24 0800  BP: (!) 145/65 (!) 148/59 (!) 148/59 (!) 129/56  Pulse: 70 71  73  Resp:    16  Temp: 98.2 F (36.8 C) 98.2 F (36.8 C)  98.2 F (36.8 C)  TempSrc:    Oral  SpO2: 94% 97%  98%    General: Pt is alert, awake, not in acute distress Cardiovascular: RRR, S1/S2 +, no rubs, no gallops Respiratory: CTA bilaterally, no wheezing, no rhonchi, patient has a Port-A-Cath. Abdominal: Soft, NT, ND, bowel sounds +, she has a colostomy bag with loose stool. Extremities: no edema, no cyanosis    The results of significant diagnostics from this hospitalization (including imaging, microbiology, ancillary and laboratory) are listed below for reference.     Microbiology: No results found for this or any previous visit (from the past 240 hours).   Labs: BNP (last 3 results) No results for input(s): BNP in the last 8760 hours. Basic Metabolic Panel: Recent Labs  Lab 02/12/24 2040 02/12/24 2047  NA 136 137  K 4.4 4.8  CL 103 103  CO2 22  --   GLUCOSE 94 87  BUN 18 22  CREATININE 1.13* 1.20*  CALCIUM  9.7  --    Liver Function Tests: Recent Labs  Lab 02/12/24 2040  AST 24  ALT 16  ALKPHOS 92  BILITOT 0.3  PROT 7.0  ALBUMIN 3.3*   No results for input(s): LIPASE, AMYLASE in the last 168 hours. Recent Labs  Lab 02/12/24 2313  AMMONIA 19    CBC: Recent Labs  Lab 02/12/24 2040 02/12/24 2047  WBC 5.2  --   NEUTROABS 2.3  --   HGB 11.5* 12.2  HCT 36.7 36.0  MCV 95.1  --   PLT 154  --    Cardiac Enzymes: No results for input(s): CKTOTAL, CKMB, CKMBINDEX, TROPONINI in the last 168 hours. BNP: Invalid input(s): POCBNP CBG: Recent Labs  Lab 02/12/24 2041  GLUCAP 91   D-Dimer Recent Labs    02/12/24 2313  DDIMER 0.95*   Hgb A1c Recent Labs    02/12/24 2313  HGBA1C 5.4   Lipid Profile Recent Labs    02/12/24 2313  CHOL 166  HDL 72  LDLCALC 80  TRIG 70  CHOLHDL 2.3   Thyroid  function studies No results for input(s): TSH, T4TOTAL, T3FREE, THYROIDAB in the last 72 hours.  Invalid input(s): FREET3 Anemia work up No results for input(s): VITAMINB12, FOLATE, FERRITIN, TIBC, IRON, RETICCTPCT in the last 72 hours. Urinalysis    Component Value Date/Time   COLORURINE YELLOW 12/01/2020 1917   APPEARANCEUR CLEAR 12/01/2020 1917   LABSPEC 1.011 12/01/2020 1917   PHURINE 6.0 12/01/2020 1917   GLUCOSEU NEGATIVE 12/01/2020 1917   HGBUR NEGATIVE 12/01/2020 1917   BILIRUBINUR NEGATIVE 12/01/2020 1917   KETONESUR NEGATIVE 12/01/2020 1917   PROTEINUR 30 (A) 02/02/2024 1218   NITRITE NEGATIVE 12/01/2020 1917   LEUKOCYTESUR SMALL (A) 12/01/2020 1917   Sepsis Labs Recent Labs  Lab 02/12/24 2040  WBC 5.2   Microbiology No results found for this or any previous visit (from the past 240 hours).   Time coordinating discharge: 35 minutes  SIGNED:   Renato Applebaum, MD  [facility] 02/14/2024, 11:54 AM

## 2024-02-15 ENCOUNTER — Other Ambulatory Visit: Payer: Self-pay

## 2024-02-15 DIAGNOSIS — S31109A Unspecified open wound of abdominal wall, unspecified quadrant without penetration into peritoneal cavity, initial encounter: Secondary | ICD-10-CM | POA: Diagnosis not present

## 2024-02-15 DIAGNOSIS — T8131XD Disruption of external operation (surgical) wound, not elsewhere classified, subsequent encounter: Secondary | ICD-10-CM | POA: Diagnosis not present

## 2024-02-15 DIAGNOSIS — K56609 Unspecified intestinal obstruction, unspecified as to partial versus complete obstruction: Secondary | ICD-10-CM | POA: Diagnosis not present

## 2024-02-15 NOTE — Progress Notes (Unsigned)
 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  -Started first line chemo on 03/12/2019, received 5FU/leuc with first 2 cycles for large open abdominal wound after surgery. She started full dose FOLFOX and avastin  with cycle 2. Due to neuropathy Oxaliplatin  was stopped after 09/24/19.  --For convenience she was switched to oral chemo Xeloda  2 weeks on/1 week off and Bev every 3 weeks in 01/21/20. She is tolerating well -Her restaging CT scan from 03/11/2023 showed stable liver metastasis, no other new lesions.  -CT 09/29/2023 showed mild disease progression in liver  -treatment changed to FOLFIRI and beva on 10/27/2023 - 02/16/2024 -patient has tolerated treatment well so far.  Will hold bevacizumab  due to recent hospitalization for suspected TIA.  Patient now on Plavix and will continue for 3 weeks.  Will switch to aspirin.  Continue  chemotherapy FOLFIRI every 2 weeks as scheduled.   Concern for stroke/TIA Patient hospitalized from 02/12/2024 through 02/14/2024.  Had experienced significant left-sided weakness and numbness.  Had to call ambulance.  Upon arrival, she was treated as code stroke.  Scans done during hospitalization ruled out acute stroke, suspect TIA.  Patient denies facial weakness or speech difficulty during this episode.  Was discharged on Plavix and will  continue this for 3 weeks.  After that she will change over to aspirin.  She states weakness has resolved.  Feels good today.  Neuropathy is at baseline.  Will hold bevacizumab  out of concern of GI side effects  Peripheral neuropathy Currently at baseline.  Patient taking gabapentin  300 mg twice daily to control symptoms.  Anemia Mild and stable anemia with Hgb 11.5 and HCT 35.4.  She does take oral iron supplements daily.  Will continue to monitor closely.  Plan This is a shared visit with Dr. Lanny. Labs reviewed. - Mild and stable anemia - Very mild reduction in GFR to 59.  Remainder of CMP is unremarkable. Reviewed hospital records with patient.  Currently on Plavix.  Will switch to aspirin after 3 weeks. -Hold bevacizumab  from treatment. Patient labs and presentation are appropriate for treatment today. Proceed with chemotherapy cycle 9 day 1 FOLFIRI (hold bevacizumab ). Labs/flush, follow-up, and subsequent treatments as currently scheduled.   The patient understands the plans discussed today and is in agreement with them.  She knows to contact our office if she develops concerns prior to her next appointment.   Powell FORBES Lessen, NP  [facility] - A DEPT OF [facility] [address] [place] [address] Dept: [phone] Dept Fax: [phone]   No orders of the defined types were placed in this encounter.     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  She was last seen on 02/02/2024 by Dr. Lanny.  She presents for cycle 9 day 1 chemotherapy FOLFIRI.  Will hold bevacizumab  due to recent hospitalization for severe headache, concern for TIA.  She is currently on Plavix and has been instructed to take this for 3 weeks and then changed to  aspirin.  She reports feeling well today without evidence of numbness or weakness on the left side.  She was having constipation but this resolved over the weekend.  Her peripheral neuropathy is at baseline.  She denies chest pain, chest pressure, or shortness of breath. She denies headaches or visual disturbances.  There are prior to her hospitalization, she was having significant headaches.  Se denies abdominal pain, nausea, vomiting, or changes in bowel or bladder habits.  She denies fevers or chills. She denies pain. Her appetite is slightly reduced.  Her weight has decreased 7 pounds over last 2 weeks.  I have reviewed the past medical history, past surgical history, social history and family history with the patient and they are unchanged from previous note.  ALLERGIES:  has no known allergies.  MEDICATIONS:  No current facility-administered medications for this visit.   Current Outpatient Medications  Medication Sig Dispense Refill   acetaminophen  (TYLENOL ) 325 MG tablet Take 2 tablets (650 mg total) by mouth every 6 (six) hours as needed.     amLODipine  (NORVASC ) 10 MG tablet TAKE 1 TABLET BY MOUTH EVERY DAY 90 tablet 1   aspirin EC 81 MG tablet Take 1 tablet (81 mg total) by mouth daily. Swallow whole. 30 tablet 12   clopidogrel (PLAVIX) 75 MG tablet Take 1 tablet (75 mg total) by mouth daily for 21 days. 21 tablet 0   dexamethasone  (DECADRON ) 4 MG tablet Take 2 tablets (8 mg total) by mouth daily. Start the day after chemotherapy for 2 days. Take with food. 8 tablet 5   ferrous sulfate  325 (65 FE) MG tablet TAKE 1 TABLET BY MOUTH TWICE A DAY WITH FOOD 180 tablet 1   gabapentin  (NEURONTIN ) 300 MG capsule TAKE 1 CAPSULE BY MOUTH TWICE A DAY 60 capsule 3   hydrALAZINE  (APRESOLINE ) 25 MG tablet TAKE 1 TABLET BY MOUTH EVERY 8 HOURS 270 tablet 0   hydrochlorothiazide  (HYDRODIURIL ) 12.5 MG tablet TAKE 1 TABLET BY MOUTH EVERY DAY 90 tablet 1   isosorbide  mononitrate (IMDUR ) 30 MG 24 hr tablet  TAKE 1 TABLET BY MOUTH EVERY DAY 90 tablet 1   levothyroxine  (SYNTHROID ) 50 MCG tablet Take 1 tablet (50 mcg total) by mouth daily. (Patient taking differently: Take 25 mcg by mouth daily.) 90 tablet 0   lidocaine -prilocaine  (EMLA ) cream APPLY 1 APPLICATION TOPICALLY AS NEEDED. (Patient taking differently: Apply 1 Application topically as needed (for port access).) 30 g 1   loperamide  (IMODIUM ) 2 MG capsule Take 2 tabs by mouth with first loose stool, then 1 tab with each additional loose stool as needed. Do not exceed 8 tabs in a 24-hour period (Patient not taking: Reported on 02/12/2024) 60 capsule 3   ondansetron  (ZOFRAN ) 8 MG tablet Take 1 tablet (8 mg total) by mouth every 8 (eight) hours as needed for nausea, vomiting or refractory nausea / vomiting. Start on the third day after chemotherapy. (Patient not taking: Reported on 02/12/2024) 30 tablet 1   prochlorperazine  (COMPAZINE ) 10 MG tablet Take 1 tablet (10 mg total) by mouth every 6 (six) hours as needed for nausea or vomiting. (Patient not taking: Reported on 02/12/2024) 30 tablet 1   Vitamin D , Ergocalciferol , (DRISDOL ) 1.25 MG (50000 UNIT) CAPS capsule Take 1  capsule (50,000 Units total) by mouth every 7 (seven) days for 12 doses. 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 Bowel-gas pattern consistent with small bowel obstruction. No free air. 2. No acute chest findings.   01/23/2019 Imaging   CT AP IMPRESSION: Obstructing mid transverse colonic mass with mild regional adenopathy and hepatic metastatic disease. The mass likely extends through the serosa; no ascites or peritoneal nodularity.   01/24/2019 Surgery   Surgeon: Mitzie DELENA Freund MD Assistant: Harlene Ferraris PA-C Procedure performed: Transverse colectomy with end colostomy, liver biopsy Procedure classification: URGENT/EMERGENT Preop diagnosis: Obstructing, metastatic transverse colon mass Post-op diagnosis/intraop findings: Same   01/24/2019 Pathology Results   FINAL MICROSCOPIC DIAGNOSIS:   A. COLON, TRANSVERSE, RESECTION:  Colonic adenocarcinoma, 5 cm.  Carcinoma extends into pericolonic connective tissue and focally to  serosal surface.  Margins not involved.  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   B. LIVER NODULE, LEFT, BIOPSY:  Metastatic adenocarcinoma.    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 Hypermetabolic metastatic disease in the liver and mediastinal/hilar/axillary lymph nodes. 2. Focal hypermetabolism in the rectum. Continued attention on follow-up exams is warranted. 3. Focal hypermetabolism medial to the right adrenal gland may be within a metastatic lymph node, better visualized on 01/23/2019. 4. Aortic atherosclerosis (ICD10-170.0). Coronary artery calcification.   03/12/2019 -  Chemotherapy   She started 5FU q2weeks on 03/12/19 for 2 cycles. She started full dose FOLFOX with Avastin  on 04/09/19. Oxaliplatin  dose reduced repeatedly due to neuropathy C12  and held since C16 on 10/09/19. Now on maintenance Avastin  and 5FU q2weeks since 10/09/19       -Maintenance change to maintenance xeloda  2000 mg BID days 1-14 q21 days and q3 weeks Zirabev  (15 mg/kg) starting 01/21/20. First cycle was taken 1000mg  BID due to misunderstanding.    05/31/2019 Imaging   Restaging CT CAP IMPRESSION: 1. Similar to mild interval decrease in size of multiple hepatic lesions, partially calcified. 2. 2 mm right upper lobe pulmonary nodule. Recommend attention on follow-up. 3. Emphysema and aortic atherosclerosis.   08/23/2019 Imaging   CT CAP w contrast  IMPRESSION: 1. The dominant peripheral right liver metastasis has mildly increased. Other smaller liver metastases are stable. 2. Otherwise no new or progressive metastatic disease in the chest, abdomen or pelvis. 3. Aortic Atherosclerosis (ICD10-I70.0) and Emphysema (ICD10-J43.9).   11/27/2019 Imaging   CT CAP w contrast  IMPRESSION: Status post transverse colectomy with right mid  abdominal colostomy.   Mildly progressive hepatic metastases, as above.   No evidence of metastatic disease in the chest. Small mediastinal lymph nodes are within normal limits.   Additional stable ancillary findings as above.   01/21/2020 - 10/06/2023 Chemotherapy   Patient is on Treatment Plan : COLORECTAL Bevacizumab  q21d     02/21/2020 Imaging   IMPRESSION: 1. Stable hepatic metastatic disease. 2. Aortic atherosclerosis (ICD10-I70.0). Coronary artery calcification. 3.  Emphysema (ICD10-J43.9).   05/19/2020 Imaging   CT CAP  IMPRESSION: 1. Multiple partially calcified liver metastases are again noted. With the exception of a small lesion in segment 7/8 lesions are not significantly changed in the interval. No new liver lesions identified. 2. Coronary artery atherosclerotic calcifications. 3. Aortic atherosclerosis. 4. 2 mm right upper lobe lung nodule identified.  Unchanged.   Aortic Atherosclerosis (ICD10-I70.0).    11/17/2020 Imaging   CT CAP  IMPRESSION: 1. Partially calcified lesions throughout the liver are stable accounting for differences in technique, contrasted imaging on today's study is compared to noncontrast imaging on the prior. 2. No new hepatic lesions. 3. Tiny 3 mm pulmonary nodule in the RIGHT upper lobe unchanged since the prior study. Attention on follow-up. 4. Mild fullness of RIGHT paratracheal nodal tissue is minimally increased and borderline enlarged, attention on follow-up. 5. RIGHT lower quadrant colostomy. 6. 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 7. Aortic atherosclerosis.   06/09/2021 Imaging   EXAM: CT CHEST, ABDOMEN, AND PELVIS WITH CONTRAST  IMPRESSION: Stable calcified liver metastases.   Stable mildly enlarged right paratracheal lymph node.   No evidence of new or progressive metastatic disease.   Aortic Atherosclerosis (ICD10-I70.0).   09/14/2021 PET scan   IMPRESSION: Multiple hypermetabolic and partially calcified liver metastases, without significant change compared to most recent CT.   Mild hypermetabolic mediastinal and bilateral hilar lymphadenopathy, also without significant change.   No evidence of new or progressive metastatic disease.     Electronically Signed   By: Norleen DELENA Kil M.D.   On: 09/15/2021 11:04   04/15/2022 Imaging    IMPRESSION: 1. Several previously noted partially calcified hepatic metastases appear grossly stable compared to the prior examination. No new hepatic lesions or other signs of metastatic disease noted elsewhere in the chest, abdomen or pelvis. 2. Aortic atherosclerosis, in addition to left main and three-vessel coronary artery disease. Please note that although the presence of coronary artery calcium  documents the presence of coronary artery disease, the severity of this disease and any  potential stenosis cannot be assessed on this non-gated CT examination. Assessment for potential risk factor modification, dietary therapy or pharmacologic therapy may be warranted, if clinically indicated. 3. There are calcifications of the mitral annulus. Echocardiographic correlation for evaluation of potential valvular dysfunction may be warranted if clinically indicated. 4. Additional incidental findings, similar to prior studies, as above.   07/15/2022 Imaging    IMPRESSION: 1. Stable exam. No new or progressive findings in the abdomen or pelvis to suggest worsening disease. 2. No substantial change in calcified liver metastases. 3. Right abdominal transverse end colostomy with parastomal herniation of fat and colon proximal to the stoma. 4. Probable left-sided uterine fibroid measuring on the order of 3.1 cm and displacing the endometrial stripe to the right. Pelvic ultrasound could be used to further evaluate as clinically warranted. 5.  Aortic Atherosclerosis (ICD10-I70.0).     07/07/2023 Imaging   CT chest,  abdomen, and pelvis IMPRESSION: Partially calcified treated hepatic metastatic disease remains stable, except for mild increase in size of a single small low-attenuation lesion in central left hepatic lobe suspicious for enlarging metastasis. Recommend correlation with tumor markers, and consider short-term follow-up by CT in 3-6 months.   No other sites of metastatic disease identified.   Stable uterine fibroid.     10/27/2023 -  Chemotherapy   Patient is on Treatment Plan : COLORECTAL FOLFIRI + Bevacizumab  q14d         REVIEW OF SYSTEMS:   Constitutional: Denies fevers, chills or abnormal weight loss Eyes: Denies blurriness of vision Ears, nose, mouth, throat, and face: Denies mucositis or sore throat Respiratory: Denies cough, dyspnea or wheezes Cardiovascular: Denies palpitation, chest discomfort or lower extremity swelling Gastrointestinal:  Denies nausea,  heartburn or change in bowel habits.  Did have constipation after hospitalization.  This is returned to normal stool. Skin: Denies abnormal skin rashes Lymphatics: Denies new lymphadenopathy or easy bruising Neurological:Denies numbness, tingling or new weaknesses.  Did have episode of severe left-sided weakness which led to hospitalization from 11/9 through 02/14/2024.  Weakness has resolved. Behavioral/Psych: Mood is stable, no new changes  All other systems were reviewed with the patient and are negative.   VITALS:   Today's Vitals   02/16/24 1134  BP: 132/64  Pulse: (!) 59  Resp: 17  Temp: 97.8 F (36.6 C)  SpO2: 99%  Weight: 231 lb 6.4 oz (105 kg)   Body mass index is 35.71 kg/m.   Wt Readings from Last 3 Encounters:  02/16/24 231 lb 6.4 oz (105 kg)  02/02/24 238 lb (108 kg)  01/23/24 236 lb 3.2 oz (107.1 kg)    Body mass index is 35.71 kg/m.  Performance status (ECOG): 1 - Symptomatic but completely ambulatory  PHYSICAL EXAM:   GENERAL:alert, no distress and comfortable SKIN: skin color, texture, turgor are normal, no rashes or significant lesions EYES: normal, Conjunctiva are pink and non-injected, sclera clear OROPHARYNX:no exudate, no erythema and lips, buccal mucosa, and tongue normal  NECK: supple, thyroid  normal size, non-tender, without nodularity LYMPH:  no palpable lymphadenopathy in the cervical, axillary or inguinal LUNGS: clear to auscultation and percussion with normal breathing effort HEART: regular rate & rhythm and no murmurs and no lower extremity edema ABDOMEN:abdomen soft, non-tender and normal bowel sounds Musculoskeletal:no cyanosis of digits and no clubbing.  Upper and lower extremity strength is 5/5 and equal bilaterally. NEURO: alert & oriented x 3 with fluent speech, no focal motor/sensory deficits  LABORATORY DATA:  I have reviewed the data as listed    Component Value Date/Time   NA 138 02/16/2024 1102   K 4.4 02/16/2024 1102   CL  105 02/16/2024 1102   CO2 26 02/16/2024 1102   GLUCOSE 82 02/16/2024 1102   BUN 23 02/16/2024 1102   CREATININE 1.06 (H) 02/16/2024 1102   CALCIUM  10.0 02/16/2024 1102   PROT 7.6 02/16/2024 1102   ALBUMIN 3.9 02/16/2024 1102   AST 29 02/16/2024 1102   ALT 20 02/16/2024 1102   ALKPHOS 104 02/16/2024 1102   BILITOT 0.5 02/16/2024 1102   GFRNONAA 59 (L) 02/16/2024 1102   GFRAA >60 01/07/2020 0957     Lab Results  Component Value Date   WBC 5.3 02/16/2024   NEUTROABS 2.6 02/16/2024   HGB 11.5 (L) 02/16/2024   HCT 35.4 (L) 02/16/2024   MCV 92.7 02/16/2024   PLT 183 02/16/2024    RADIOGRAPHIC STUDIES: MR CERVICAL SPINE W  WO CONTRAST Result Date: 02/13/2024 CLINICAL DATA:  Initial evaluation for left arm and neck numbness. Metastatic disease evaluation. EXAM: MRI CERVICAL SPINE WITHOUT AND WITH CONTRAST TECHNIQUE: Multiplanar and multiecho pulse sequences of the cervical spine, to include the craniocervical junction and cervicothoracic junction, were obtained without and with intravenous contrast. CONTRAST:  10mL GADAVIST GADOBUTROL 1 MMOL/ML IV SOLN COMPARISON:  None Available. FINDINGS: Alignment: Straightening of the normal cervical lordosis. No listhesis. Vertebrae: Vertebral body height maintained without acute or chronic fracture. Mildly decreased T1 signal intensity within the visualized bone marrow, nonspecific, but most commonly related to anemia, smoking, or obesity. Subcentimeter benign hemangioma noted within the T1 vertebral body. No worrisome osseous lesions or evidence for metastatic disease. Mild degenerative reactive endplate changes present about the C4-5 interspace. No other abnormal marrow edema or enhancement. Cord: Normal signal and morphology.  No abnormal enhancement. Posterior Fossa, vertebral arteries, paraspinal tissues: Chronic pontine lacunar infarct noted. Visualized brain and posterior fossa otherwise unremarkable. Paraspinous soft tissues within normal limits.  Normal flow voids seen within the vertebral arteries bilaterally. Disc levels: C2-C3: Normal interspace. Mild left-sided facet hypertrophy. No canal or foraminal stenosis. C3-C4: Mild disc bulge with uncovertebral spurring. Mild left-sided facet hypertrophy. No significant spinal stenosis. Foramina remain patent. C4-C5: Degenerative intervertebral disc space narrowing. Right paracentral disc osteophyte complex indents the right ventral thecal sac (series 8, image 22). Mild cord flattening without cord signal changes. No significant spinal stenosis. Superimposed uncovertebral spurring without significant foraminal encroachment. C5-C6: Degenerative intervertebral disc space narrowing. Diffuse disc bulge with bilateral uncovertebral spurring. No significant spinal stenosis. Foramina remain adequately patent. C6-C7: Diffuse disc bulge with bilateral uncovertebral spurring. No spinal stenosis. Foramina remain adequately patent. C7-T1: Small left foraminal disc osteophyte complex (series 5, image 11). Mild bilateral facet hypertrophy. No spinal stenosis. Mild left C8 foraminal narrowing. Right neural foramina remains patent. IMPRESSION: 1. No evidence for metastatic disease within the cervical spine. 2. Small left foraminal disc osteophyte complex at C7-T1, potentially irritating the left C8 nerve root. 3. Right paracentral disc osteophyte complex at C4-5 with resultant mild cord flattening, but no cord signal changes or significant stenosis. 4. Additional mild noncompressive disc bulging at C3-4 through C6-7 without significant stenosis or impingement. 5. Chronic pontine lacunar infarct. Electronically Signed   By: Morene Hoard M.D.   On: 02/13/2024 21:27   ECHOCARDIOGRAM COMPLETE Result Date: 02/13/2024    ECHOCARDIOGRAM REPORT   Patient Name:   CHACE BISCH Date of Exam: 02/13/2024 Medical Rec #:  [id number]         Height:       67.5 in Accession #:    [id number]        Weight:       238.0 lb Date of  Birth:  05/20/1960        BSA:          2.189 m Patient Age:    62 years          BP:           138/62 mmHg Patient Gender: F                 HR:           58 bpm. Exam Location:  Inpatient Procedure: 2D Echo, Cardiac Doppler and Color Doppler (Both Spectral and Color            Flow Doppler were utilized during procedure). Indications:    Stroke  History:  Patient has prior history of Echocardiogram examinations, most                 recent 11/04/2021. Risk Factors:Hypertension.  Sonographer:    Carmelita Hartshorn RDCS, FE, PE Referring Phys: [id number] VASUNDHRA RATHORE IMPRESSIONS  1. Left ventricular ejection fraction, by estimation, is 65 to 70%. The left ventricle has normal function. The left ventricle has no regional wall motion abnormalities. There is mild concentric left ventricular hypertrophy. Left ventricular diastolic parameters are consistent with Grade I diastolic dysfunction (impaired relaxation).  2. Right ventricular systolic function is normal. The right ventricular size is normal.  3. The mitral valve is degenerative. Trivial mitral valve regurgitation. No evidence of mitral stenosis.  4. The aortic valve is tricuspid. Aortic valve regurgitation is not visualized. Aortic valve sclerosis/calcification is present, without any evidence of aortic stenosis.  5. The inferior vena cava is normal in size with greater than 50% respiratory variability, suggesting right atrial pressure of 3 mmHg. Conclusion(s)/Recommendation(s): No intracardiac source of embolism detected on this transthoracic study. Consider a transesophageal echocardiogram to exclude cardiac source of embolism if clinically indicated. FINDINGS  Left Ventricle: Left ventricular ejection fraction, by estimation, is 65 to 70%. The left ventricle has normal function. The left ventricle has no regional wall motion abnormalities. The left ventricular internal cavity size was normal in size. There is  mild concentric left ventricular hypertrophy.  Left ventricular diastolic parameters are consistent with Grade I diastolic dysfunction (impaired relaxation). Normal left ventricular filling pressure. Right Ventricle: The right ventricular size is normal. No increase in right ventricular wall thickness. Right ventricular systolic function is normal. Left Atrium: Left atrial size was normal in size. Right Atrium: Right atrial size was normal in size. Pericardium: There is no evidence of pericardial effusion. Mitral Valve: The mitral valve is degenerative in appearance. There is moderate thickening of the mitral valve leaflet(s). There is mild calcification of the mitral valve leaflet(s). Mild mitral annular calcification. Trivial mitral valve regurgitation. No evidence of mitral valve stenosis. Tricuspid Valve: The tricuspid valve is normal in structure. Tricuspid valve regurgitation is trivial. No evidence of tricuspid stenosis. Aortic Valve: The aortic valve is tricuspid. Aortic valve regurgitation is not visualized. Aortic valve sclerosis/calcification is present, without any evidence of aortic stenosis. Pulmonic Valve: The pulmonic valve was normal in structure. Pulmonic valve regurgitation is trivial. No evidence of pulmonic stenosis. Aorta: The aortic root is normal in size and structure. Venous: The inferior vena cava is normal in size with greater than 50% respiratory variability, suggesting right atrial pressure of 3 mmHg. IAS/Shunts: No atrial level shunt detected by color flow Doppler.  LEFT VENTRICLE PLAX 2D LVIDd:         4.40 cm   Diastology LVIDs:         2.90 cm   LV e' medial:    6.42 cm/s LV PW:         1.10 cm   LV E/e' medial:  10.4 LV IVS:        1.20 cm   LV e' lateral:   5.33 cm/s LVOT diam:     2.12 cm   LV E/e' lateral: 12.5 LV SV:         97 LV SV Index:   44 LVOT Area:     3.53 cm  RIGHT VENTRICLE RV S prime:     13.80 cm/s LEFT ATRIUM             Index  RIGHT ATRIUM           Index LA diam:        3.57 cm 1.63 cm/m   RA Area:      12.00 cm LA Vol (A2C):   63.7 ml 29.09 ml/m  RA Volume:   25.20 ml  11.51 ml/m LA Vol (A4C):   80.0 ml 36.54 ml/m LA Biplane Vol: 72.4 ml 33.07 ml/m  AORTIC VALVE LVOT Vmax:   123.00 cm/s LVOT Vmean:  79.700 cm/s LVOT VTI:    0.276 m  AORTA Ao Root diam: 2.78 cm Ao Asc diam:  2.37 cm MITRAL VALVE               TRICUSPID VALVE MV Area (PHT): 2.66 cm    TR Peak grad:   40.2 mmHg MV Decel Time: 285 msec    TR Vmax:        317.00 cm/s MV E velocity: 66.60 cm/s MV A velocity: 98.00 cm/s  SHUNTS MV E/A ratio:  0.68        Systemic VTI:  0.28 m                            Systemic Diam: 2.12 cm Wilbert Bihari MD Electronically signed by Wilbert Bihari MD Signature Date/Time: 02/13/2024/9:19:02 AM    Final    CT ANGIO HEAD NECK W WO CM Result Date: 02/13/2024 EXAM: CTA HEAD AND NECK WITHOUT AND WITH 02/13/2024 03:23:38 AM TECHNIQUE: CTA of the head and neck was performed without and with the administration of intravenous contrast. Multiplanar 2D and/or 3D reformatted images are provided for review. Automated exposure control, iterative reconstruction, and/or weight based adjustment of the mA/kV was utilized to reduce the radiation dose to as low as reasonably achievable. Stenosis of the internal carotid arteries measured using NASCET criteria. COMPARISON: None available CLINICAL HISTORY: Neuro deficit, acute, stroke suspected FINDINGS: AORTIC ARCH AND ARCH VESSELS: No dissection or arterial injury. No significant stenosis of the brachiocephalic or subclavian arteries. CERVICAL CAROTID ARTERIES: Approximately 50% stenosis of the right internal carotid artery at the skull base. Approximately 70% stenosis of the right internal carotid artery at the skull base. CERVICAL VERTEBRAL ARTERIES: Patent vertebral arteries. Moderate right proximal V2 verebtral artery stenosis. 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 LUNGS AND MEDIASTINUM: Unremarkable. SOFT TISSUES: No acute abnormality. BONES: No acute abnormality.  ANTERIOR CIRCULATION: Severe bilateral cavernous ICA stenosis. No significant stenosis of the anterior cerebral arteries. No significant stenosis of the middle cerebral arteries. No aneurysm. POSTERIOR CIRCULATION: No significant stenosis of the posterior cerebral arteries. No significant stenosis of the basilar artery. No significant stenosis of the vertebral arteries. No aneurysm. OTHER: No dural venous sinus thrombosis on this non-dedicated study. IMPRESSION: 1. No large vessel occlusion. 2. Severe bilateral cavernous ICA stenosis. 3. Stenoses of the ICAs at the skull base, 70% on the left and 50% on the right. 4. Moderate right proximal V2 verebtral artery stenosis. 5. 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 Electronically signed by: Gilmore Molt MD 02/13/2024 03:51 AM EST RP Workstation: [id number]   MR BRAIN W WO CONTRAST Result Date: 02/13/2024 EXAM: MRI Brain With and Without Contrast 02/13/2024 01:18:47 AM TECHNIQUE: Multiplanar multisequence MRI of the head/brain was performed with and without the administration of intravenous contrast. COMPARISON: CT head 12/13/2023 CLINICAL HISTORY: Metastatic disease evaluation; Neuro deficit, acute, stroke suspected FINDINGS: BRAIN AND VENTRICLES: No acute infarct. No acute intracranial  hemorrhage. No mass effect or midline shift. No hydrocephalus. Normal flow voids. No mass or abnormal enhancement. Small remote right thalamic lacunar infarct and mild chronic microvascular ischemic change. ORBITS: No acute abnormality. SINUSES: No acute abnormality. BONES AND SOFT TISSUES: Normal bone marrow signal and enhancement. No acute soft tissue abnormality. IMPRESSION: 1. No evidence of acute intracranial abnormality or metastatic disease. Electronically signed by: Gilmore Molt MD 02/13/2024 02:09 AM EST RP Workstation: [id number]   DG CHEST PORT 1 VIEW Result Date: 02/12/2024 EXAM: 1 VIEW(S) XRAY OF THE CHEST 02/12/2024 11:44:38 PM  COMPARISON: Chest CT 01/26/2024 CLINICAL HISTORY: Chest pain. FINDINGS: LINES, TUBES AND DEVICES: Right chest port catheter tip is over the SVC. LUNGS AND PLEURA: No focal pulmonary opacity. No pulmonary edema. No pleural effusion. No pneumothorax. HEART AND MEDIASTINUM: Cardiac silhouette appears borderline enlarged, a new finding. BONES AND SOFT TISSUES: No acute osseous abnormality. IMPRESSION: 1. No acute findings. 2. New borderline enlarged cardiac silhouette; correlate clinically. Electronically signed by: Greig Pique MD 02/12/2024 11:58 PM EST RP Workstation: [id number]   CT HEAD CODE STROKE WO CONTRAST (LKW 0-4.5h, LVO 0-24h) Result Date: 02/12/2024 EXAM: CT HEAD WITHOUT CONTRAST 02/12/2024 08:57:06 PM TECHNIQUE: CT of the head was performed without the administration of intravenous contrast. Automated exposure control, iterative reconstruction, and/or weight based adjustment of the mA/kV was utilized to reduce the radiation dose to as low as reasonably achievable. COMPARISON: None available. CLINICAL HISTORY: Neuro deficit, acute, stroke suspected. FINDINGS: BRAIN AND VENTRICLES: No acute hemorrhage. No evidence of acute infarct. No hydrocephalus. No extra-axial collection. No mass effect or midline shift. ORBITS: No acute abnormality. SINUSES: No acute abnormality. SOFT TISSUES AND SKULL: No acute soft tissue abnormality. No skull fracture. Findings conveyed to Dr. Vanessa via pager at 9:05 PM IMPRESSION: 1. No acute intracranial abnormality. Electronically signed by: Gilmore Molt MD 02/12/2024 09:06 PM EST RP Workstation: [id number]   CT CHEST ABDOMEN PELVIS W CONTRAST Result Date: 01/28/2024 CLINICAL DATA:  Metastatic colon cancer, assess treatment response * Tracking Code: BO * EXAM: CT CHEST, ABDOMEN, AND PELVIS WITH CONTRAST TECHNIQUE: Multidetector CT imaging of the chest, abdomen and pelvis was performed following the standard protocol during bolus administration of intravenous  contrast. RADIATION DOSE REDUCTION: This exam was performed according to the departmental dose-optimization program which includes automated exposure control, adjustment of the mA and/or kV according to patient size and/or use of iterative reconstruction technique. CONTRAST:  OMNIPAQUE  IOHEXOL  300 MG/ML  SOLN COMPARISON:  09/29/2023 FINDINGS: CT CHEST FINDINGS Cardiovascular: Right chest port catheter. Aortic atherosclerosis. Normal heart size. Three-vessel coronary artery calcifications. No pericardial effusion. Mediastinum/Nodes: Unchanged enlarged pretracheal lymph nodes measuring up to 1.6 x 1.1 cm (series 2, image 24). Thyroid  gland, trachea, and esophagus demonstrate no significant findings. Lungs/Pleura: Mild centrilobular emphysema. Diffuse bilateral bronchial wall thickening. No pleural effusion or pneumothorax. Musculoskeletal: No chest wall abnormality. No acute osseous findings. CT ABDOMEN PELVIS FINDINGS Hepatobiliary: Interval enlargement of noncalcified hypodense metastasis in the central left lobe of the liver, hepatic segment IV, measuring 2.7 x 2.3 cm, previously 2.3 x 2.0 cm (series 2, image 62). Multiple additional calcified and noncalcified metastases throughout the liver are unchanged. Cholecystectomy. Unchanged mild postoperative biliary ductal dilatation. Pancreas: Unremarkable. No pancreatic ductal dilatation or surrounding inflammatory changes. Spleen: Normal in size without significant abnormality. Adrenals/Urinary Tract: Adrenal glands are unremarkable. Kidneys are normal, without renal calculi, solid lesion, or hydronephrosis. Bladder is unremarkable. Stomach/Bowel: Stomach is within normal limits. Appendix appears normal. No evidence of bowel wall thickening, distention,  or inflammatory changes. Vascular/Lymphatic: Aortic atherosclerosis. No enlarged abdominal or pelvic lymph nodes. Reproductive: No mass or other abnormality. 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 Small fat containing bilateral inguinal hernias. No ascites. Musculoskeletal: No acute osseous findings. IMPRESSION: 1. Interval enlargement of noncalcified hypodense metastasis in the central left lobe of the liver, hepatic segment IV. Multiple additional calcified and noncalcified metastases throughout the liver are unchanged. 2. Unchanged enlarged pretracheal lymph nodes. 3. 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 4. Emphysema and diffuse bilateral bronchial wall thickening. 5. Coronary artery disease. Aortic Atherosclerosis (ICD10-I70.0) and Emphysema (ICD10-J43.9). Electronically Signed   By: Marolyn JONETTA Jaksch M.D.   On: 01/28/2024 23:09   Addendum I have seen the patient, examined her. I agree with the assessment and and plan and have edited the notes.   Pt was recently hospitalized for TIA, she recovered quickly, no residual neurodeficits.  She otherwise is doing well.  Plan to continue chemo, will hold bevacizumab  for 3 months.  All questions were answered.  Onita Mattock MD 02/16/2024

## 2024-02-16 ENCOUNTER — Encounter: Payer: Self-pay | Admitting: Hematology

## 2024-02-16 ENCOUNTER — Inpatient Hospital Stay

## 2024-02-16 ENCOUNTER — Inpatient Hospital Stay (HOSPITAL_BASED_OUTPATIENT_CLINIC_OR_DEPARTMENT_OTHER): Admitting: Nurse Practitioner

## 2024-02-16 VITALS — BP 132/64 | HR 59 | Temp 97.8°F | Resp 17 | Wt 231.4 lb

## 2024-02-16 DIAGNOSIS — C184 Malignant neoplasm of transverse colon: Secondary | ICD-10-CM | POA: Diagnosis not present

## 2024-02-16 DIAGNOSIS — C779 Secondary and unspecified malignant neoplasm of lymph node, unspecified: Secondary | ICD-10-CM | POA: Diagnosis not present

## 2024-02-16 DIAGNOSIS — C189 Malignant neoplasm of colon, unspecified: Secondary | ICD-10-CM | POA: Diagnosis not present

## 2024-02-16 DIAGNOSIS — C787 Secondary malignant neoplasm of liver and intrahepatic bile duct: Secondary | ICD-10-CM

## 2024-02-16 DIAGNOSIS — Z5111 Encounter for antineoplastic chemotherapy: Secondary | ICD-10-CM | POA: Diagnosis not present

## 2024-02-16 DIAGNOSIS — Z79899 Other long term (current) drug therapy: Secondary | ICD-10-CM | POA: Diagnosis not present

## 2024-02-16 LAB — CBC WITH DIFFERENTIAL (CANCER CENTER ONLY)
Abs Immature Granulocytes: 0 K/uL (ref 0.00–0.07)
Basophils Absolute: 0 K/uL (ref 0.0–0.1)
Basophils Relative: 1 %
Eosinophils Absolute: 0.2 K/uL (ref 0.0–0.5)
Eosinophils Relative: 4 %
HCT: 35.4 % — ABNORMAL LOW (ref 36.0–46.0)
Hemoglobin: 11.5 g/dL — ABNORMAL LOW (ref 12.0–15.0)
Immature Granulocytes: 0 %
Lymphocytes Relative: 35 %
Lymphs Abs: 1.8 K/uL (ref 0.7–4.0)
MCH: 30.1 pg (ref 26.0–34.0)
MCHC: 32.5 g/dL (ref 30.0–36.0)
MCV: 92.7 fL (ref 80.0–100.0)
Monocytes Absolute: 0.6 K/uL (ref 0.1–1.0)
Monocytes Relative: 12 %
Neutro Abs: 2.6 K/uL (ref 1.7–7.7)
Neutrophils Relative %: 48 %
Platelet Count: 183 K/uL (ref 150–400)
RBC: 3.82 MIL/uL — ABNORMAL LOW (ref 3.87–5.11)
RDW: 14.8 % (ref 11.5–15.5)
WBC Count: 5.3 K/uL (ref 4.0–10.5)
nRBC: 0 % (ref 0.0–0.2)

## 2024-02-16 LAB — CMP (CANCER CENTER ONLY)
ALT: 20 U/L (ref 0–44)
AST: 29 U/L (ref 15–41)
Albumin: 3.9 g/dL (ref 3.5–5.0)
Alkaline Phosphatase: 104 U/L (ref 38–126)
Anion gap: 7 (ref 5–15)
BUN: 23 mg/dL (ref 8–23)
CO2: 26 mmol/L (ref 22–32)
Calcium: 10 mg/dL (ref 8.9–10.3)
Chloride: 105 mmol/L (ref 98–111)
Creatinine: 1.06 mg/dL — ABNORMAL HIGH (ref 0.44–1.00)
GFR, Estimated: 59 mL/min — ABNORMAL LOW (ref >60)
Glucose, Bld: 82 mg/dL (ref 70–99)
Potassium: 4.4 mmol/L (ref 3.5–5.1)
Sodium: 138 mmol/L (ref 135–145)
Total Bilirubin: 0.5 mg/dL (ref 0.0–1.2)
Total Protein: 7.6 g/dL (ref 6.5–8.1)

## 2024-02-16 LAB — TOTAL PROTEIN, URINE DIPSTICK: Protein, ur: 30 mg/dL — AB

## 2024-02-16 MED ORDER — DEXAMETHASONE SOD PHOSPHATE PF 10 MG/ML IJ SOLN
10.0000 mg | Freq: Once | INTRAMUSCULAR | Status: AC
  Administered 2024-02-16: 10 mg via INTRAVENOUS

## 2024-02-16 MED ORDER — SODIUM CHLORIDE 0.9 % IV SOLN: INTRAVENOUS | Status: DC

## 2024-02-16 MED ORDER — SODIUM CHLORIDE 0.9 % IV SOLN
180.0000 mg/m2 | Freq: Once | INTRAVENOUS | Status: AC
  Administered 2024-02-16: 400 mg via INTRAVENOUS
  Filled 2024-02-16: qty 15

## 2024-02-16 MED ORDER — PALONOSETRON HCL INJECTION 0.25 MG/5ML
0.2500 mg | Freq: Once | INTRAVENOUS | Status: AC
  Administered 2024-02-16: 0.25 mg via INTRAVENOUS
  Filled 2024-02-16: qty 5

## 2024-02-16 MED ORDER — SODIUM CHLORIDE 0.9 % IV SOLN
400.0000 mg/m2 | Freq: Once | INTRAVENOUS | Status: AC
  Administered 2024-02-16: 912 mg via INTRAVENOUS
  Filled 2024-02-16: qty 25

## 2024-02-16 MED ORDER — SODIUM CHLORIDE 0.9 % IV SOLN
2200.0000 mg/m2 | INTRAVENOUS | Status: DC
  Administered 2024-02-16: 5000 mg via INTRAVENOUS
  Filled 2024-02-16: qty 100

## 2024-02-16 MED ORDER — ATROPINE SULFATE 1 MG/ML IV SOLN
0.5000 mg | Freq: Once | INTRAVENOUS | Status: AC | PRN
  Administered 2024-02-16: 0.5 mg via INTRAVENOUS

## 2024-02-16 NOTE — Patient Instructions (Addendum)
 [facility] - A DEPT OF [facility]  Discharge Instructions: Thank you for choosing [facility] to provide your oncology and hematology care.   If you have a lab appointment with the Cancer Center, please go directly to the Cancer Center and check in at the registration area.   Wear comfortable clothing and clothing appropriate for easy access to any Portacath or PICC line.   We strive to give you quality time with your provider. You may need to reschedule your appointment if you arrive late (15 or more minutes).  Arriving late affects you and other patients whose appointments are after yours.  Also, if you miss three or more appointments without notifying the office, you may be dismissed from the clinic at the provider's discretion.      For prescription refill requests, have your pharmacy contact our office and allow 72 hours for refills to be completed.    Today you received the following chemotherapy and/or immunotherapy agents: Irinotecan , Leucovorin , and Fluorouracil .   To help prevent nausea and vomiting after your treatment, we encourage you to take your nausea medication as directed.  BELOW ARE SYMPTOMS THAT SHOULD BE REPORTED IMMEDIATELY: *FEVER GREATER THAN 100.4 F (38 C) OR HIGHER *CHILLS OR SWEATING *NAUSEA AND VOMITING THAT IS NOT CONTROLLED WITH YOUR NAUSEA MEDICATION *UNUSUAL SHORTNESS OF BREATH *UNUSUAL BRUISING OR BLEEDING *URINARY PROBLEMS (pain or burning when urinating, or frequent urination) *BOWEL PROBLEMS (unusual diarrhea, constipation, pain near the anus) TENDERNESS IN MOUTH AND THROAT WITH OR WITHOUT PRESENCE OF ULCERS (sore throat, sores in mouth, or a toothache) UNUSUAL RASH, SWELLING OR PAIN  UNUSUAL VAGINAL DISCHARGE OR ITCHING   Items with * indicate a potential emergency and should be followed up as soon as possible or go to the Emergency Department if any problems should occur.  Please show the CHEMOTHERAPY  ALERT CARD or IMMUNOTHERAPY ALERT CARD at check-in to the Emergency Department and triage nurse.  Should you have questions after your visit or need to cancel or reschedule your appointment, please contact [facility] - A DEPT OF [facility]  Dept: [phone]  and follow the prompts.  Office hours are 8:00 a.m. to 4:30 p.m. Monday - Friday. Please note that voicemails left after 4:00 p.m. may not be returned until the following business day.  We are closed weekends and major holidays. You have access to a nurse at all times for urgent questions. Please call the main number to the clinic Dept: [phone] and follow the prompts.   For any non-urgent questions, you may also contact your provider using MyChart. We now offer e-Visits for anyone 40 and older to request care online for non-urgent symptoms. For details visit [email or web address].   Also download the MyChart app! Go to the app store, search MyChart, open the app, select [place], and log in with your MyChart username and password.  The chemotherapy medication bag should finish at 46 hours, 96 hours, or 7 days. For example, if your pump is scheduled for 46 hours and it was put on at 4:00 p.m., it should finish at 2:00 p.m. the day it is scheduled to come off regardless of your appointment time.     Estimated time to finish at 1:15 PM   If the display on your pump reads Low Volume and it is beeping, take the batteries out of the pump and come to the cancer center for it  to be taken off.   If the pump alarms go off prior to the pump reading Low Volume then call [phone] and someone can assist you.  If the plunger comes out and the chemotherapy medication is leaking out, please use your home chemo spill kit to clean up the spill. Do NOT use paper towels or other household products.  If you have problems or questions regarding your pump, please call either [phone] (24 hours a day) or  the cancer center Monday-Friday 8:00 a.m.- 4:30 p.m. at the clinic number and we will assist you. If you are unable to get assistance, then go to the nearest Emergency Department and ask the staff to contact the IV team for assistance.

## 2024-02-16 NOTE — Assessment & Plan Note (Addendum)
 [id number] stage IV with liver and nodal metastasis, MSS, KRAS G12S(+)  -Diagnosed in 01/2019 after emergent colectomy and liver biopsy. 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  -Started first line chemo on 03/12/2019, received 5FU/leuc with first 2 cycles for large open abdominal wound after surgery. She started full dose FOLFOX and avastin  with cycle 2. Due to neuropathy Oxaliplatin  was stopped after 09/24/19.  --For convenience she was switched to oral chemo Xeloda  2 weeks on/1 week off and Bev every 3 weeks in 01/21/20. She is tolerating well -Her restaging CT scan from 03/11/2023 showed stable liver metastasis, no other new lesions.  -CT 09/29/2023 showed mild disease progression in liver  -treatment changed to FOLFIRI and beva on 10/27/2023 - 02/16/2024 -patient has tolerated treatment well so far.  Will hold bevacizumab  due to recent hospitalization for suspected TIA.  Patient now on Plavix and will continue for 3 weeks.  Will switch to aspirin.  Continue  chemotherapy FOLFIRI every 2 weeks as scheduled.

## 2024-02-18 ENCOUNTER — Inpatient Hospital Stay

## 2024-02-22 ENCOUNTER — Other Ambulatory Visit: Payer: Self-pay

## 2024-02-22 ENCOUNTER — Encounter (HOSPITAL_COMMUNITY): Payer: Self-pay | Admitting: Radiology

## 2024-02-22 ENCOUNTER — Telehealth: Payer: Self-pay

## 2024-02-22 ENCOUNTER — Emergency Department (HOSPITAL_COMMUNITY)

## 2024-02-22 ENCOUNTER — Inpatient Hospital Stay (HOSPITAL_COMMUNITY)
Admission: EM | Admit: 2024-02-22 | Discharge: 2024-02-24 | DRG: 378 | Disposition: A | Discharge status: Home / Self Care | Source: Ambulatory Visit | Attending: Internal Medicine | Admitting: Internal Medicine

## 2024-02-22 DIAGNOSIS — Z79890 Hormone replacement therapy (postmenopausal): Secondary | ICD-10-CM

## 2024-02-22 DIAGNOSIS — Z8673 Personal history of transient ischemic attack (TIA), and cerebral infarction without residual deficits: Secondary | ICD-10-CM | POA: Diagnosis not present

## 2024-02-22 DIAGNOSIS — D61810 Antineoplastic chemotherapy induced pancytopenia: Secondary | ICD-10-CM | POA: Diagnosis not present

## 2024-02-22 DIAGNOSIS — Z7902 Long term (current) use of antithrombotics/antiplatelets: Secondary | ICD-10-CM

## 2024-02-22 DIAGNOSIS — K625 Hemorrhage of anus and rectum: Secondary | ICD-10-CM

## 2024-02-22 DIAGNOSIS — K649 Unspecified hemorrhoids: Secondary | ICD-10-CM | POA: Diagnosis not present

## 2024-02-22 DIAGNOSIS — D696 Thrombocytopenia, unspecified: Secondary | ICD-10-CM | POA: Diagnosis present

## 2024-02-22 DIAGNOSIS — K5791 Diverticulosis of intestine, part unspecified, without perforation or abscess with bleeding: Secondary | ICD-10-CM | POA: Diagnosis not present

## 2024-02-22 DIAGNOSIS — Z7982 Long term (current) use of aspirin: Secondary | ICD-10-CM | POA: Diagnosis not present

## 2024-02-22 DIAGNOSIS — Z6835 Body mass index (BMI) 35.0-35.9, adult: Secondary | ICD-10-CM

## 2024-02-22 DIAGNOSIS — Z8249 Family history of ischemic heart disease and other diseases of the circulatory system: Secondary | ICD-10-CM

## 2024-02-22 DIAGNOSIS — E039 Hypothyroidism, unspecified: Secondary | ICD-10-CM | POA: Diagnosis present

## 2024-02-22 DIAGNOSIS — Z9049 Acquired absence of other specified parts of digestive tract: Secondary | ICD-10-CM | POA: Diagnosis not present

## 2024-02-22 DIAGNOSIS — C772 Secondary and unspecified malignant neoplasm of intra-abdominal lymph nodes: Secondary | ICD-10-CM | POA: Diagnosis present

## 2024-02-22 DIAGNOSIS — C189 Malignant neoplasm of colon, unspecified: Secondary | ICD-10-CM | POA: Diagnosis not present

## 2024-02-22 DIAGNOSIS — I700 Atherosclerosis of aorta: Secondary | ICD-10-CM | POA: Diagnosis not present

## 2024-02-22 DIAGNOSIS — Z87891 Personal history of nicotine dependence: Secondary | ICD-10-CM | POA: Diagnosis not present

## 2024-02-22 DIAGNOSIS — D509 Iron deficiency anemia, unspecified: Secondary | ICD-10-CM | POA: Diagnosis not present

## 2024-02-22 DIAGNOSIS — Z0000 Encounter for general adult medical examination without abnormal findings: Secondary | ICD-10-CM | POA: Diagnosis not present

## 2024-02-22 DIAGNOSIS — Z8419 Family history of other disorders of kidney and ureter: Secondary | ICD-10-CM

## 2024-02-22 DIAGNOSIS — C787 Secondary malignant neoplasm of liver and intrahepatic bile duct: Secondary | ICD-10-CM | POA: Diagnosis present

## 2024-02-22 DIAGNOSIS — Z79899 Other long term (current) drug therapy: Secondary | ICD-10-CM

## 2024-02-22 DIAGNOSIS — Z933 Colostomy status: Secondary | ICD-10-CM

## 2024-02-22 DIAGNOSIS — K922 Gastrointestinal hemorrhage, unspecified: Secondary | ICD-10-CM | POA: Diagnosis not present

## 2024-02-22 DIAGNOSIS — K5731 Diverticulosis of large intestine without perforation or abscess with bleeding: Principal | ICD-10-CM | POA: Diagnosis present

## 2024-02-22 DIAGNOSIS — Z9221 Personal history of antineoplastic chemotherapy: Secondary | ICD-10-CM | POA: Diagnosis not present

## 2024-02-22 DIAGNOSIS — I10 Essential (primary) hypertension: Secondary | ICD-10-CM | POA: Diagnosis present

## 2024-02-22 LAB — CBC
HCT: 33.6 % — ABNORMAL LOW (ref 36.0–46.0)
Hemoglobin: 10.6 g/dL — ABNORMAL LOW (ref 12.0–15.0)
MCH: 30.3 pg (ref 26.0–34.0)
MCHC: 31.5 g/dL (ref 30.0–36.0)
MCV: 96 fL (ref 80.0–100.0)
Platelets: 160 K/uL (ref 150–400)
RBC: 3.5 MIL/uL — ABNORMAL LOW (ref 3.87–5.11)
RDW: 14.6 % (ref 11.5–15.5)
WBC: 2.6 K/uL — ABNORMAL LOW (ref 4.0–10.5)
nRBC: 0 % (ref 0.0–0.2)

## 2024-02-22 LAB — COMPREHENSIVE METABOLIC PANEL WITH GFR
ALT: 21 U/L (ref 0–44)
AST: 21 U/L (ref 15–41)
Albumin: 3.7 g/dL (ref 3.5–5.0)
Alkaline Phosphatase: 109 U/L (ref 38–126)
Anion gap: 7 (ref 5–15)
BUN: 23 mg/dL (ref 8–23)
CO2: 25 mmol/L (ref 22–32)
Calcium: 9.8 mg/dL (ref 8.9–10.3)
Chloride: 104 mmol/L (ref 98–111)
Creatinine, Ser: 0.91 mg/dL (ref 0.44–1.00)
GFR, Estimated: >60 mL/min (ref >60)
Glucose, Bld: 87 mg/dL (ref 70–99)
Potassium: 5.1 mmol/L (ref 3.5–5.1)
Sodium: 136 mmol/L (ref 135–145)
Total Bilirubin: 0.5 mg/dL (ref 0.0–1.2)
Total Protein: 7.1 g/dL (ref 6.5–8.1)

## 2024-02-22 LAB — HEMOGLOBIN AND HEMATOCRIT, BLOOD
HCT: 33.8 % — ABNORMAL LOW (ref 36.0–46.0)
Hemoglobin: 10.7 g/dL — ABNORMAL LOW (ref 12.0–15.0)

## 2024-02-22 LAB — TYPE AND SCREEN
ABO/RH(D): B POS
Antibody Screen: NEGATIVE

## 2024-02-22 LAB — POC OCCULT BLOOD, ED: Fecal Occult Bld: NEGATIVE

## 2024-02-22 MED ORDER — ASPIRIN 81 MG PO TBEC
81.0000 mg | DELAYED_RELEASE_TABLET | Freq: Every day | ORAL | Status: DC
Start: 2024-02-22 — End: 2024-02-23
  Administered 2024-02-22: 81 mg via ORAL
  Filled 2024-02-22 (×2): qty 1

## 2024-02-22 MED ORDER — HYDRALAZINE HCL 25 MG PO TABS
25.0000 mg | ORAL_TABLET | Freq: Three times a day (TID) | ORAL | Status: DC
  Administered 2024-02-22 – 2024-02-24 (×5): 25 mg via ORAL
  Filled 2024-02-22 (×5): qty 1

## 2024-02-22 MED ORDER — ACETAMINOPHEN 650 MG RE SUPP: 650.0000 mg | Freq: Four times a day (QID) | RECTAL | Status: DC | PRN

## 2024-02-22 MED ORDER — ACETAMINOPHEN 325 MG PO TABS: 650.0000 mg | ORAL_TABLET | Freq: Four times a day (QID) | ORAL | Status: DC | PRN

## 2024-02-22 MED ORDER — ONDANSETRON HCL 4 MG PO TABS: 4.0000 mg | ORAL_TABLET | Freq: Four times a day (QID) | ORAL | Status: DC | PRN

## 2024-02-22 MED ORDER — SODIUM CHLORIDE 0.9 % IV BOLUS
1000.0000 mL | Freq: Once | INTRAVENOUS | Status: AC
  Administered 2024-02-22: 1000 mL via INTRAVENOUS

## 2024-02-22 MED ORDER — GABAPENTIN 300 MG PO CAPS
300.0000 mg | ORAL_CAPSULE | Freq: Two times a day (BID) | ORAL | Status: DC
  Administered 2024-02-22 – 2024-02-24 (×4): 300 mg via ORAL
  Filled 2024-02-22 (×4): qty 1

## 2024-02-22 MED ORDER — ONDANSETRON HCL 4 MG/2ML IJ SOLN: 4.0000 mg | Freq: Four times a day (QID) | INTRAMUSCULAR | Status: DC | PRN

## 2024-02-22 MED ORDER — IOHEXOL 350 MG/ML SOLN
100.0000 mL | Freq: Once | INTRAVENOUS | Status: AC | PRN
  Administered 2024-02-22: 100 mL via INTRAVENOUS

## 2024-02-22 MED ORDER — DEXAMETHASONE 4 MG PO TABS: 8.0000 mg | ORAL_TABLET | Freq: Every day | ORAL | Status: DC

## 2024-02-22 MED ORDER — HYDRALAZINE HCL 20 MG/ML IJ SOLN: 5.0000 mg | Freq: Four times a day (QID) | INTRAMUSCULAR | Status: DC | PRN

## 2024-02-22 MED ORDER — LEVOTHYROXINE SODIUM 25 MCG PO TABS
25.0000 ug | ORAL_TABLET | Freq: Every day | ORAL | Status: DC
  Administered 2024-02-23 – 2024-02-24 (×2): 25 ug via ORAL
  Filled 2024-02-22 (×2): qty 1

## 2024-02-22 NOTE — ED Provider Notes (Signed)
 [facility] EMERGENCY DEPARTMENT AT [facility] Provider Note   CSN: [id number] Arrival date & time: 02/22/24  1149     Patient presents with: Rectal Bleeding   Casey Black is a 63 y.o. female.  Patient is a 63 year old female with a history of hypertension and current colon cancer so the ED for this morning.  Patient notes she was working when she felt her vomit.  She believes she had some water  but then noticed it was a little bright red blood.  She has soaked through several pads.  She states she had several dark clots as well.  She notes she was seen at the hospital last week and was diagnosed with a TIA.  She saw her oncologist and was placed on Plavix as well as aspirin.  She called the oncology office and was told to come to the ED for evaluation.  Denies any pain.  Notes last chemotherapy treatment was Thursday last week.  Denies fevers, nausea/vomiting chest pain, shortness of breath, abdominal pain, dysuria, hematuria.  No further complaints.  Rectal Bleeding Associated symptoms: no abdominal pain, no fever and no vomiting        Prior to Admission medications   Medication Sig Start Date End Date Taking? Authorizing Provider  acetaminophen  (TYLENOL ) 325 MG tablet Take 2 tablets (650 mg total) by mouth every 6 (six) hours as needed. 02/03/19   Rizwan, Saima, MD  amLODipine  (NORVASC ) 10 MG tablet TAKE 1 TABLET BY MOUTH EVERY DAY 08/15/23   [name], [name], MD  aspirin EC 81 MG tablet Take 1 tablet (81 mg total) by mouth daily. Swallow whole. 02/14/24   Ghimire, Kuber, MD  clopidogrel (PLAVIX) 75 MG tablet Take 1 tablet (75 mg total) by mouth daily for 21 days. 02/14/24 03/06/24  Raenelle Coria, MD  dexamethasone  (DECADRON ) 4 MG tablet Take 2 tablets (8 mg total) by mouth daily. Start the day after chemotherapy for 2 days. Take with food. 10/27/23   [name], MD  ferrous sulfate  325 (65 FE) MG tablet TAKE 1 TABLET BY MOUTH TWICE A DAY WITH FOOD 08/19/23    [name], MD  gabapentin  (NEURONTIN ) 300 MG capsule TAKE 1 CAPSULE BY MOUTH TWICE A DAY 10/06/23   [name], [name], NP  hydrALAZINE  (APRESOLINE ) 25 MG tablet TAKE 1 TABLET BY MOUTH EVERY 8 HOURS 08/15/23   [name], [name], MD  hydrochlorothiazide  (HYDRODIURIL ) 12.5 MG tablet TAKE 1 TABLET BY MOUTH EVERY DAY 07/18/23   [name], [name], MD  isosorbide  mononitrate (IMDUR ) 30 MG 24 hr tablet TAKE 1 TABLET BY MOUTH EVERY DAY 08/15/23   [name], [name], MD  levothyroxine  (SYNTHROID ) 50 MCG tablet Take 1 tablet (50 mcg total) by mouth daily. Patient taking differently: Take 25 mcg by mouth daily. 01/24/24   [name], [name], MD  lidocaine -prilocaine  (EMLA ) cream APPLY 1 APPLICATION TOPICALLY AS NEEDED. Patient taking differently: Apply 1 Application topically as needed (for port access). 06/02/22   [name], MD  loperamide  (IMODIUM ) 2 MG capsule Take 2 tabs by mouth with first loose stool, then 1 tab with each additional loose stool as needed. Do not exceed 8 tabs in a 24-hour period Patient not taking: Reported on 02/12/2024 10/27/23   [name], MD  ondansetron  (ZOFRAN ) 8 MG tablet Take 1 tablet (8 mg total) by mouth every 8 (eight) hours as needed for nausea, vomiting or refractory nausea / vomiting. Start on the third day after chemotherapy. Patient not  taking: Reported on 02/12/2024 10/27/23   [name], MD  prochlorperazine  (COMPAZINE ) 10 MG tablet Take 1 tablet (10 mg total) by mouth every 6 (six) hours as needed for nausea or vomiting. Patient not taking: Reported on 02/12/2024 10/27/23   [name], MD  Vitamin D , Ergocalciferol , (DRISDOL ) 1.25 MG (50000 UNIT) CAPS capsule Take 1 capsule (50,000 Units total) by mouth every 7 (seven) days for 12 doses. 01/24/24 04/11/24  Theophilus Delma Tully [name], MD    Allergies: Patient has no known allergies.    Review of Systems  Constitutional:  Negative for chills and fever.  Respiratory:  Negative for shortness of breath.    Cardiovascular:  Negative for chest pain.  Gastrointestinal:  Positive for anal bleeding, diarrhea and hematochezia. Negative for abdominal pain, nausea and vomiting.    Updated Vital Signs BP (!) 177/74   Pulse 90   Temp 98.1 F (36.7 C)   Resp 18   SpO2 99%   Physical Exam Constitutional:      Appearance: Normal appearance.  HENT:     Head: Normocephalic and atraumatic.     Nose: Nose normal.     Mouth/Throat:     Mouth: Mucous membranes are moist.     Pharynx: Oropharynx is clear.  Cardiovascular:     Rate and Rhythm: Normal rate.  Pulmonary:     Effort: Pulmonary effort is normal.  Abdominal:     General: Bowel sounds are normal.     Palpations: Abdomen is soft.     Tenderness: There is no abdominal tenderness.     Comments: Colostomy in place with appropriate brown stool in bag  Genitourinary:    Comments: On rectal exam, there is diffuse continued bleeding noted around the rectum.  No  obvious fissures.  1 small external hemorrhoid.  There are few large clots present in patient's underwear as well. Skin:    General: Skin is warm and dry.  Neurological:     Mental Status: She is alert and oriented to person, place, and time.  Psychiatric:        Mood and Affect: Mood normal.        Behavior: Behavior normal.     (all labs ordered are listed, but only abnormal results are displayed) Labs Reviewed  COMPREHENSIVE METABOLIC PANEL WITH GFR  CBC  POC OCCULT BLOOD, ED  TYPE AND SCREEN    EKG: None  Radiology: No results found.    Medications Ordered in the ED  sodium chloride  0.9 % bolus 1,000 mL (has no administration in time range)    Clinical Course as of 02/22/24 2104  Wed Feb 22, 2024  1358 Hemoglobin(!): 10.6 Decreased from 11.5 6 days prior [AY]    Clinical Course User Index [AY] Casey Thersia RAMAN, PA-C                                Medical Decision Making Amount and/or Complexity of Data Reviewed Labs: ordered. Decision-making details  documented in ED Course. Radiology: ordered.  Risk Prescription drug management. Decision regarding hospitalization.   Patient is a 63 year old female with history of current colon cancer who presents to the ED for bright red rectal bleeding that began this morning.  Patient detailed HPI above.  On exam patient is alert and well-appearing.  Physical exam as noted above.  There is diffuse bleeding noted from the rectal area on exam.  Patient given 1 liter fluids.  CT for GI bleed reviewed showed cancerous disease but no acute findings or active bleeding.  CBC reveals a hemoglobin of 10.6, this is decreased from 11.4 6 days prior.  Of note, patient was placed on Plavix and aspirin last week for concerns of a TIA.  In the setting of colon cancer and continued active GI bleeding with decreased hemoglobin, hospitalist has been consulted and are agreeable to admit patient for further evaluation.  Consult to GI was placed.  Patient remained stable while in ED, vital signs stable.    Case discussed with attending who agrees with assessment, plan, admission today.   Final diagnoses:  None    ED Discharge Orders     None          Casey Thersia GORMAN DEVONNA 02/22/24 2109    Dreama Longs, MD 02/22/24 2242

## 2024-02-22 NOTE — H&P (Signed)
 History and Physical    Casey Black FMW:[id number] DOB: [birth year] DOA: 02/22/2024  PCP: [name], [name], MD (Confirm with patient/family/NH records and if not entered, this has to be entered at [facility] point of entry) Patient coming from: Home  I have personally briefly reviewed patient's old medical records in [facility]  Chief Complaint: Rectal bleeding  HPI: Lelaina Oatis is a 63 y.o. female with medical history significant of recently diagnosed TIA on aspirin and Plavix regimen, stage IV colon cancer with liver and nodal metastasis with a previous SBO status post partial colectomy with right colostomy, chronic iron deficiency anemia, presented with rectal bleeding.  Patient was at her job when sitting and felt  some liquid soaked bottom she stool up to look and found maroon-colored fluid stained to cushion she was sitting on and went to bathroom and had more maroon-colored liquid mixed with clot passing.  She denied any abdominal pain, no shortness of breath no lightheadedness.  She did not see any blood or maroon-colored material in the colostomy bag.  Last week patient came to [facility] for a TIA process, workup including MRI was negative and patient was discharged on 21 days course of aspirin+ Plavix regimen, the last time she took Plavix was yesterday morning.  ED Course: Afebrile, borderline bradycardia blood pressure 150/60 O2 sats 100% on room air.  CTA abdomen pelvis negative for active bleeding, blood work showed hemoglobin 10.6 compared to baseline 11.5 of last week, BUN 23 creatinine 0.8 glucose 87 WBC 2.6.  Review of Systems: As per HPI otherwise 14 point review of systems negative.    Past Medical History:  Diagnosis Date   Anemia    low iron   Colon cancer (HCC) 01/2019   Hypertension 01/23/2019   Personal history of chemotherapy 01/2019   colon CA   SBO (small bowel obstruction) (HCC) 01/23/2019    Past Surgical History:   Procedure Laterality Date   CESAREAN SECTION     x2   COLOSTOMY N/A 01/24/2019   Procedure: End Loop Colostomy;  Surgeon: [name], MD;  Location: [facility] OR;  Service: General;  Laterality: N/A;   PARTIAL COLECTOMY N/A 01/24/2019   Procedure: PARTIAL COLECTOMY;  Surgeon: [name], MD;  Location: [facility] OR;  Service: General;  Laterality: N/A;   PORTACATH PLACEMENT Right 02/28/2019   Procedure: INSERTION PORT-A-CATH WITH ULTRASOUND GUIDANCE;  Surgeon: [name], MD;  Location: [facility] OR;  Service: General;  Laterality: Right;     reports that she quit smoking about 5 years ago. Her smoking use included cigarettes. She started smoking about 34 years ago. She has a 14.5 pack-year smoking history. She has never used smokeless tobacco. She reports that she does not currently use alcohol. She reports that she does not use drugs.  No Known Allergies  Family History  Problem Relation Age of Onset   Heart murmur Mother    Kidney failure Father    Hypertension Father    Hypertension Sister      Prior to Admission medications   Medication Sig Start Date End Date Taking? Authorizing Provider  acetaminophen  (TYLENOL ) 325 MG tablet Take 2 tablets (650 mg total) by mouth every 6 (six) hours as needed. 02/03/19   Rizwan, Saima, MD  amLODipine  (NORVASC ) 10 MG tablet TAKE 1 TABLET BY MOUTH EVERY DAY 08/15/23   [name], [name], MD  aspirin EC 81 MG tablet Take 1 tablet (81 mg total) by mouth daily.  Swallow whole. 02/14/24   Ghimire, Kuber, MD  clopidogrel (PLAVIX) 75 MG tablet Take 1 tablet (75 mg total) by mouth daily for 21 days. 02/14/24 03/06/24  Raenelle Coria, MD  dexamethasone  (DECADRON ) 4 MG tablet Take 2 tablets (8 mg total) by mouth daily. Start the day after chemotherapy for 2 days. Take with food. 10/27/23   [name], MD  ferrous sulfate  325 (65 FE) MG tablet TAKE 1 TABLET BY MOUTH TWICE A DAY WITH FOOD 08/19/23   [name], MD  gabapentin  (NEURONTIN ) 300 MG capsule  TAKE 1 CAPSULE BY MOUTH TWICE A DAY 10/06/23   [name], [name], NP  hydrALAZINE  (APRESOLINE ) 25 MG tablet TAKE 1 TABLET BY MOUTH EVERY 8 HOURS 08/15/23   [name], [name], MD  hydrochlorothiazide  (HYDRODIURIL ) 12.5 MG tablet TAKE 1 TABLET BY MOUTH EVERY DAY 07/18/23   [name], [name], MD  isosorbide  mononitrate (IMDUR ) 30 MG 24 hr tablet TAKE 1 TABLET BY MOUTH EVERY DAY 08/15/23   [name], [name], MD  levothyroxine  (SYNTHROID ) 50 MCG tablet Take 1 tablet (50 mcg total) by mouth daily. Patient taking differently: Take 25 mcg by mouth daily. 01/24/24   [name], [name], MD  lidocaine -prilocaine  (EMLA ) cream APPLY 1 APPLICATION TOPICALLY AS NEEDED. Patient taking differently: Apply 1 Application topically as needed (for port access). 06/02/22   [name], MD  loperamide  (IMODIUM ) 2 MG capsule Take 2 tabs by mouth with first loose stool, then 1 tab with each additional loose stool as needed. Do not exceed 8 tabs in a 24-hour period Patient not taking: Reported on 02/12/2024 10/27/23   [name], MD  ondansetron  (ZOFRAN ) 8 MG tablet Take 1 tablet (8 mg total) by mouth every 8 (eight) hours as needed for nausea, vomiting or refractory nausea / vomiting. Start on the third day after chemotherapy. Patient not taking: Reported on 02/12/2024 10/27/23   [name], MD  prochlorperazine  (COMPAZINE ) 10 MG tablet Take 1 tablet (10 mg total) by mouth every 6 (six) hours as needed for nausea or vomiting. Patient not taking: Reported on 02/12/2024 10/27/23   [name], MD  Vitamin D , Ergocalciferol , (DRISDOL ) 1.25 MG (50000 UNIT) CAPS capsule Take 1 capsule (50,000 Units total) by mouth every 7 (seven) days for 12 doses. 01/24/24 04/11/24  [name] [name], MD    Physical Exam: Vitals:   02/22/24 1155 02/22/24 1548 02/22/24 1549  BP: (!) 177/74 (!) 154/63 (!) 154/63  Pulse: 90 (!) 55 (!) 55  Resp: 18  18  Temp: 98.1 F (36.7 C)    SpO2: 99% 99% 100%    Constitutional:  NAD, calm, comfortable Vitals:   02/22/24 1155 02/22/24 1548 02/22/24 1549  BP: (!) 177/74 (!) 154/63 (!) 154/63  Pulse: 90 (!) 55 (!) 55  Resp: 18  18  Temp: 98.1 F (36.7 C)    SpO2: 99% 99% 100%   Eyes: PERRL, lids and conjunctivae normal ENMT: Mucous membranes are moist. Posterior pharynx clear of any exudate or lesions.Normal dentition.  Neck: normal, supple, no masses, no thyromegaly Respiratory: clear to auscultation bilaterally, no wheezing, no crackles. Normal respiratory effort. No accessory muscle use.  Cardiovascular: Regular rate and rhythm, no murmurs / rubs / gallops. No extremity edema. 2+ pedal pulses. No carotid bruits.  Abdomen: no tenderness, no masses palpated. No hepatosplenomegaly. Bowel sounds positive.  Colostomy collect formed brown stool no blood clots or streak of blood Musculoskeletal: no clubbing / cyanosis. No joint deformity upper and lower  extremities. Good ROM, no contractures. Normal muscle tone.  Skin: no rashes, lesions, ulcers. No induration Neurologic: CN 2-12 grossly intact. Sensation intact, DTR normal. Strength 5/5 in all 4.  Psychiatric: Normal judgment and insight. Alert and oriented x 3. Normal mood.    Labs on Admission: I have personally reviewed following labs and imaging studies  CBC: Recent Labs  Lab 02/16/24 1102 02/22/24 1325  WBC 5.3 2.6*  NEUTROABS 2.6  --   HGB 11.5* 10.6*  HCT 35.4* 33.6*  MCV 92.7 96.0  PLT 183 160   Basic Metabolic Panel: Recent Labs  Lab 02/16/24 1102 02/22/24 1325  NA 138 136  K 4.4 5.1  CL 105 104  CO2 26 25  GLUCOSE 82 87  BUN 23 23  CREATININE 1.06* 0.91  CALCIUM  10.0 9.8   GFR: Estimated Creatinine Clearance: 79.6 mL/min (by C-G formula based on SCr of 0.91 mg/dL). Liver Function Tests: Recent Labs  Lab 02/16/24 1102 02/22/24 1325  AST 29 21  ALT 20 21  ALKPHOS 104 109  BILITOT 0.5 0.5  PROT 7.6 7.1  ALBUMIN 3.9 3.7   No results for input(s): LIPASE, AMYLASE in the last  168 hours. No results for input(s): AMMONIA in the last 168 hours. Coagulation Profile: No results for input(s): INR, PROTIME in the last 168 hours. Cardiac Enzymes: No results for input(s): CKTOTAL, CKMB, CKMBINDEX, TROPONINI in the last 168 hours. BNP (last 3 results) No results for input(s): PROBNP in the last 8760 hours. HbA1C: No results for input(s): HGBA1C in the last 72 hours. CBG: No results for input(s): GLUCAP in the last 168 hours. Lipid Profile: No results for input(s): CHOL, HDL, LDLCALC, TRIG, CHOLHDL, LDLDIRECT in the last 72 hours. Thyroid  Function Tests: No results for input(s): TSH, T4TOTAL, FREET4, T3FREE, THYROIDAB in the last 72 hours. Anemia Panel: No results for input(s): VITAMINB12, FOLATE, FERRITIN, TIBC, IRON, RETICCTPCT in the last 72 hours. Urine analysis:    Component Value Date/Time   COLORURINE YELLOW 12/01/2020 1917   APPEARANCEUR CLEAR 12/01/2020 1917   LABSPEC 1.011 12/01/2020 1917   PHURINE 6.0 12/01/2020 1917   GLUCOSEU NEGATIVE 12/01/2020 1917   HGBUR NEGATIVE 12/01/2020 1917   BILIRUBINUR NEGATIVE 12/01/2020 1917   KETONESUR NEGATIVE 12/01/2020 1917   PROTEINUR 30 (A) 02/16/2024 1102   NITRITE NEGATIVE 12/01/2020 1917   LEUKOCYTESUR SMALL (A) 12/01/2020 1917    Radiological Exams on Admission: CT ANGIO GI BLEED Result Date: 02/22/2024 CLINICAL DATA:  Possible lower GI bleed. Passing clots per rectum this morning. History of stage IV colon cancer. Taking aspirin. EXAM: CTA ABDOMEN AND PELVIS WITHOUT AND WITH CONTRAST TECHNIQUE: Multidetector CT imaging of the abdomen and pelvis was performed using the standard protocol during bolus administration of intravenous contrast. Multiplanar reconstructed images and MIPs were obtained and reviewed to evaluate the vascular anatomy. RADIATION DOSE REDUCTION: This exam was performed according to the departmental dose-optimization program which  includes automated exposure control, adjustment of the mA and/or kV according to patient size and/or use of iterative reconstruction technique. CONTRAST:  OMNIPAQUE  IOHEXOL  350 MG/ML SOLN COMPARISON:  01/26/2024 FINDINGS: VASCULAR Aorta: Abdominal aorta is normal in caliber without aneurysm or dissection. Mild calcified plaque over the abdominal aorta. Celiac: Patent without evidence of aneurysm, dissection, vasculitis or significant stenosis. SMA: Mild calcified plaque over the superior mesenteric artery which is otherwise patent without significant stenosis or occlusion. Renals: Calcified plaque at the origin the right renal artery with minimal focal narrowing as the right renal artery is  otherwise patent. Moderate focal narrowing over the proximal 1 cm of the left renal artery which is otherwise patent. Small inferior accessory left renal artery. IMA: Patent without evidence of aneurysm, dissection, vasculitis or significant stenosis. Inflow: Patent without evidence of aneurysm, dissection, vasculitis or significant stenosis. Proximal Outflow: Bilateral common femoral and visualized portions of the superficial and profunda femoral arteries are patent without evidence of aneurysm, dissection, vasculitis or significant stenosis. Veins: No obvious venous abnormality within the limitations of this arterial phase study. Review of the MIP images confirms the above findings. NON-VASCULAR Lower chest: Heart is normal size. Calcified plaque over the 3 vessel coronary arteries. Visualized lung bases are clear. Hepatobiliary: Multiple calcified and at least 1 noncalcified liver metastasis without significant change. Gallbladder is normal. Minimal prominence of the central intrahepatic ducts unchanged. Pancreas: Unremarkable. No pancreatic ductal dilatation or surrounding inflammatory changes. Spleen: Normal in size without focal abnormality. Adrenals/Urinary Tract: Adrenal glands are normal. Kidneys are normal size  without hydronephrosis or nephrolithiasis. No focal renal mass. Ureters and bladder are normal. Stomach/Bowel: Stomach is normal. Small bowel is unremarkable. Tangle of small bowel loops over the anterior mid abdomen unchanged possibly due to adhesions. Evidence of patient's previous partial colectomy. Colostomy over the right mid abdomen unchanged. Appendix is normal. Cecum and ileocecal valve located within a peristomal hernia at the ostomy site unchanged. Lymphatic: No adenopathy. Reproductive: Uterus just right of midline with stable 3.5 cm fibroid over the left side of the body of the uterus partially displacing the endometrium to the right. Adnexal regions unremarkable. Other: No free peritoneal fluid. No focal inflammatory change. Retained fecal debris over the rectum unchanged. Musculoskeletal: No focal abnormality. IMPRESSION: 1. No acute findings in the abdomen/pelvis. No evidence of active GI bleed. 2. Evidence of patient's previous partial colectomy with colostomy over the right mid abdomen unchanged. Tangle of small bowel loops over the anterior mid abdomen unchanged possibly due to adhesions. No evidence of bowel obstruction. 3. Multiple calcified and at least 1 noncalcified liver metastasis without significant change. 4. Stable 3.5 cm uterine fibroid. 5. Aortic atherosclerosis. Atherosclerotic coronary artery disease. Aortic Atherosclerosis (ICD10-I70.0). Electronically Signed   By: Toribio Agreste M.D.   On: 02/22/2024 15:52    EKG: None  Assessment/Plan Principal Problem:   GI bleed Active Problems:   Lower GI bleed  (please populate well all problems here in Problem List. (For example, if patient is on BP meds at home and you resume or decide to hold them, it is a problem that needs to be her. Same for CAD, COPD, HLD and so on)  Hematochezia Acute on chronic iron deficiency anemia - Will have to hold Plavix  - CTA negative for active bleeding - Bleeding source likely part of colon  distal to colostomy.  GI consulted.  Low suspicion for upper GI bleed.  N.p.o. for now - Recheck H&H tonight, transfuse for hemodynamic instability  HTN - Hold off long-acting BP meds including Imdur  and amlodipine  - Continue hydralazine , add as needed hydralazine   Recent TIA - Continue aspirin  and statin - Hold off Plavix   Stage IV colon cancer - No acute concern, outpatient follow-up with oncology.  Morbid obesity - BMI= 35 - Outpatient GLP-1 agonist therapy evaluation  DVT prophylaxis: SCD Code Status: Full code Family Communication: None at bedside Disposition Plan: Patient is sick with GI bleed while on aspirin  and Plavix  regimen, requiring inpatient GI consultation and possible inpatient GI procedure, expect more than 2 midnight hospital stay. Consults called: [facility]  Admission status: PCU admit   Cort ONEIDA Mana MD [facility] Pager [phone]  02/22/2024, 4:47 PM

## 2024-02-22 NOTE — ED Triage Notes (Signed)
 Pt reports passing blood and clots from her rectum this AM. Pt has stage 4 colon cancer, is currently taking aspirin and another new medication she is unaware the name of.

## 2024-02-22 NOTE — Telephone Encounter (Signed)
 Pt called stating that she's bleeding from her rectum.  Pt stating she's having blood clots bright red coming from her rectum.  Pt stated she recently had a stroke and was placed on Plavix and baby aspirin.  Pt stated she saw streaks of blood in her panties on 02/21/2024 but today while at work her panties are saturated with blood plus passing blood clots.  Pt stated she does have a hx of hemorrhoids and is post menopausal.  Instructed pt to go to nearest ED/ER for further evaluation.  Pt denied fatigue nor headaches.  Pt's Bevacizumab  was held last tx. Pt stated she would go to [facility] ER for further evaluation.  Notified Dr. Lanny and Team

## 2024-02-23 ENCOUNTER — Encounter: Payer: Self-pay | Admitting: Hematology

## 2024-02-23 DIAGNOSIS — K5791 Diverticulosis of intestine, part unspecified, without perforation or abscess with bleeding: Secondary | ICD-10-CM

## 2024-02-23 LAB — HEMOGLOBIN AND HEMATOCRIT, BLOOD
HCT: 33.2 % — ABNORMAL LOW (ref 36.0–46.0)
Hemoglobin: 10.4 g/dL — ABNORMAL LOW (ref 12.0–15.0)

## 2024-02-23 LAB — CBC
HCT: 32.3 % — ABNORMAL LOW (ref 36.0–46.0)
Hemoglobin: 10 g/dL — ABNORMAL LOW (ref 12.0–15.0)
MCH: 29.7 pg (ref 26.0–34.0)
MCHC: 31 g/dL (ref 30.0–36.0)
MCV: 95.8 fL (ref 80.0–100.0)
Platelets: 144 K/uL — ABNORMAL LOW (ref 150–400)
RBC: 3.37 MIL/uL — ABNORMAL LOW (ref 3.87–5.11)
RDW: 14.6 % (ref 11.5–15.5)
WBC: 2.6 K/uL — ABNORMAL LOW (ref 4.0–10.5)
nRBC: 0 % (ref 0.0–0.2)

## 2024-02-23 MED ORDER — CHLORHEXIDINE GLUCONATE CLOTH 2 % EX PADS
6.0000 | MEDICATED_PAD | Freq: Every day | CUTANEOUS | Status: DC
  Administered 2024-02-23 – 2024-02-24 (×2): 6 via TOPICAL

## 2024-02-23 MED ORDER — SODIUM CHLORIDE 0.9% FLUSH
10.0000 mL | Freq: Two times a day (BID) | INTRAVENOUS | Status: DC
  Administered 2024-02-23 – 2024-02-24 (×2): 10 mL

## 2024-02-23 MED ORDER — SODIUM CHLORIDE 0.9% FLUSH: 10.0000 mL | INTRAVENOUS | Status: DC | PRN

## 2024-02-23 NOTE — ED Notes (Signed)
 Pt denies bloody stool from rectum. Pt has brown stool in ostomy bag.

## 2024-02-23 NOTE — Progress Notes (Signed)
 PROGRESS NOTE    Casey Black  FMW:[id number] DOB: 1960/05/11 DOA: 02/22/2024 PCP: [name], [name], MD    Brief Narrative:   Casey Black is a 63 y.o. female with past medical history significant for HTN, stage IV colon cancer with liver and nodal metastasis on chemotherapy, previous SBO s/p partial colectomy with right colostomy, iron deficiency anemia, hypothyroidism, anxiety who presented to [facility] ED on 02/22/2024 with complaints of rectal bleeding.  Patient reports was at her job while she was sitting and felt some liquid to soak her bottom.  When she stood up she found maroon-colored fluid staining to the cushion; then she went to the bathroom with more maroon-colored liquid stool mixed with clot.  Patient denies any abdominal pain, no shortness of breath and no lightheadedness.  Patient denies any blood noted in her colostomy bag.  Recently admitted to [facility] for TIA, workup including MRI was negative and patient was discharged on DAPT with aspirin and Plavix for 21 days, last Plavix use was morning of 11/18.  In the ED, temperature 98.1 F, HR 90, RR 18, BP 177/74, SpO2 99% room air.  WBC 2.6, hemoglobin 10.6, platelet count 160.  Sodium 136, potassium 5.1, chloride 104, CO2 25, glucose 87, BUN 23, creatinine 0.91.  AST 21, ALT 21, total bilirubin 0.5.  FOBT negative (unclear if collected from ostomy versus rectum).  CT angiogram GI bleed study with no acute findings in abdomen/pelvis, no evidence of active GI bleed, noted patient's previous partial colectomy with colostomy of the right midabdomen unchanged, tangle of small bowel loops over the anterior mid abdomen unchanged possibly due to adhesions, no evidence of bowel obstruction.  TRH consulted for admission for further evaluation management of LGIB.  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  Recently started on DAPT with aspirin/Plavix after recent TIA workup.  Denies any blood in her ostomy.  Hemoglobin 10.6 at time of arrival.  CT angiogram GI bleed study with no acute findings in abdomen/pelvis. -- Hgb 10.6>10.7>10.0 -- Continue monitor hemoglobin every 8 hours -- Start clear liquid diet, will further advance if next hemoglobin drawl remains stable -- Hold aspirin/Plavix -- CBC in a.m., anemia panel  Thrombocytopenia Platelet count 144, stable  History of recent TIA Recent started on DAPT x 21 days with aspirin/Plavix. -- Holding as above  Stage IV colon cancer with liver/nodal metastasis Follows with medical oncology outpatient, Dr. Lanny.  Diagnosed 2020. Status post chemotherapy with 5-FU + leucovorin  + Avastin . Oxaliplatin  later discontinued due to neuropathy. Bevacizumab  was then added. Due to progression of disease, treatment was changed to FOLFIRI and bevacizumab  on 10/27/2023  -- Continue outpatient follow-up   DVT prophylaxis: SCDs Start: 02/22/24 1647    Code Status: Full Code Family Communication: No family present at bedside this morning  Disposition Plan:  Level of care: Progressive Status is: Inpatient Remains inpatient appropriate because: Continue monitoring of hemoglobin    Consultants:  Medical oncology  Procedures:  None  Antimicrobials:  None   Subjective: Patient seen examined bedside, lying in bed.  Remains in ED holding area.  Patient reports significant decrease in bleeding, none since last night.  Hemoglobin is remained stable.  Discussed with patient likely diverticular bleed from colon remnant.  Continues to deny any blood in her ostomy bag.  Start clear liquid diet.  Continue  to monitor hemoglobin.  No other questions or concerns at this time.  Denies headache, no dizziness, no chest pain, no palpitations, no shortness of breath, no abdominal pain, no fever/chills/night  sweats, no nausea/vomiting, no focal weakness, no fatigue, no paresthesia.  No acute events overnight per nursing.  Objective: Vitals:   02/23/24 0149 02/23/24 0515 02/23/24 0554 02/23/24 0843  BP:  137/68  (!) 133/57  Pulse:  (!) 56  60  Resp:  16  16  Temp: 97.9 F (36.6 C)  98.7 F (37.1 C) 97.7 F (36.5 C)  TempSrc: Oral  Oral Oral  SpO2:  100%  98%   No intake or output data in the 24 hours ending 02/23/24 1323 There were no vitals filed for this visit.  Examination:  Physical Exam: GEN: NAD, alert and oriented x 3, wd/wn HEENT: NCAT, PERRL, EOMI, sclera clear, MMM PULM: CTAB w/o wheezes/crackles, normal respiratory effort, on room air CV: RRR w/o M/G/R GI: abd soft, NTND, + BS; ostomy noted with brown liquid stool in collection bag MSK: no peripheral edema, moves all extremities with preserved muscle strength NEURO: No focal neurological deficit PSYCH: normal mood/affect Integumentary: dry/intact, no rashes or wounds    Data Reviewed: I have personally reviewed following labs and imaging studies  CBC: Recent Labs  Lab 02/22/24 1325 02/22/24 2214 02/23/24 0500  WBC 2.6*  --  2.6*  HGB 10.6* 10.7* 10.0*  HCT 33.6* 33.8* 32.3*  MCV 96.0  --  95.8  PLT 160  --  144*   Basic Metabolic Panel: Recent Labs  Lab 02/22/24 1325  NA 136  K 5.1  CL 104  CO2 25  GLUCOSE 87  BUN 23  CREATININE 0.91  CALCIUM  9.8   GFR: Estimated Creatinine Clearance: 79.6 mL/min (by C-G formula based on SCr of 0.91 mg/dL). Liver Function Tests: Recent Labs  Lab 02/22/24 1325  AST 21  ALT 21  ALKPHOS 109  BILITOT 0.5  PROT 7.1  ALBUMIN 3.7   No results for input(s): LIPASE, AMYLASE in the last 168 hours. No results for input(s): AMMONIA in the last 168 hours. Coagulation Profile: No results for input(s): INR, PROTIME in the last 168 hours. Cardiac Enzymes: No results for input(s): CKTOTAL, CKMB, CKMBINDEX, TROPONINI in the last 168 hours. BNP (last  3 results) No results for input(s): PROBNP in the last 8760 hours. HbA1C: No results for input(s): HGBA1C in the last 72 hours. CBG: No results for input(s): GLUCAP in the last 168 hours. Lipid Profile: No results for input(s): CHOL, HDL, LDLCALC, TRIG, CHOLHDL, LDLDIRECT in the last 72 hours. Thyroid  Function Tests: No results for input(s): TSH, T4TOTAL, FREET4, T3FREE, THYROIDAB in the last 72 hours. Anemia Panel: No results for input(s): VITAMINB12, FOLATE, FERRITIN, TIBC, IRON, RETICCTPCT in the last 72 hours. Sepsis Labs: No results for input(s): PROCALCITON, LATICACIDVEN in the last 168 hours.  No results found for this or any previous visit (from the past 240 hours).       Radiology Studies: CT ANGIO GI BLEED Result Date: 02/22/2024 CLINICAL DATA:  Possible lower GI bleed. Passing clots per rectum this morning. History of stage IV colon cancer. Taking aspirin. EXAM: CTA ABDOMEN AND PELVIS WITHOUT AND WITH CONTRAST TECHNIQUE: Multidetector CT imaging of the abdomen and pelvis was performed using the standard protocol during bolus administration of intravenous contrast. Multiplanar reconstructed images and MIPs were obtained and reviewed to evaluate the vascular anatomy. RADIATION DOSE REDUCTION: This exam was performed according to the departmental dose-optimization  program which includes automated exposure control, adjustment of the mA and/or kV according to patient size and/or use of iterative reconstruction technique. CONTRAST:  OMNIPAQUE  IOHEXOL  350 MG/ML SOLN COMPARISON:  01/26/2024 FINDINGS: VASCULAR Aorta: Abdominal aorta is normal in caliber without aneurysm or dissection. Mild calcified plaque over the abdominal aorta. Celiac: Patent without evidence of aneurysm, dissection, vasculitis or significant stenosis. SMA: Mild calcified plaque over the superior mesenteric artery which is otherwise patent without significant stenosis or  occlusion. Renals: Calcified plaque at the origin the right renal artery with minimal focal narrowing as the right renal artery is otherwise patent. Moderate focal narrowing over the proximal 1 cm of the left renal artery which is otherwise patent. Small inferior accessory left renal artery. IMA: Patent without evidence of aneurysm, dissection, vasculitis or significant stenosis. Inflow: Patent without evidence of aneurysm, dissection, vasculitis or significant stenosis. Proximal Outflow: Bilateral common femoral and visualized portions of the superficial and profunda femoral arteries are patent without evidence of aneurysm, dissection, vasculitis or significant stenosis. Veins: No obvious venous abnormality within the limitations of this arterial phase study. Review of the MIP images confirms the above findings. NON-VASCULAR Lower chest: Heart is normal size. Calcified plaque over the 3 vessel coronary arteries. Visualized lung bases are clear. Hepatobiliary: Multiple calcified and at least 1 noncalcified liver metastasis without significant change. Gallbladder is normal. Minimal prominence of the central intrahepatic ducts unchanged. Pancreas: Unremarkable. No pancreatic ductal dilatation or surrounding inflammatory changes. Spleen: Normal in size without focal abnormality. Adrenals/Urinary Tract: Adrenal glands are normal. Kidneys are normal size without hydronephrosis or nephrolithiasis. No focal renal mass. Ureters and bladder are normal. Stomach/Bowel: Stomach is normal. Small bowel is unremarkable. Tangle of small bowel loops over the anterior mid abdomen unchanged possibly due to adhesions. Evidence of patient's previous partial colectomy. Colostomy over the right mid abdomen unchanged. Appendix is normal. Cecum and ileocecal valve located within a peristomal hernia at the ostomy site unchanged. Lymphatic: No adenopathy. Reproductive: Uterus just right of midline with stable 3.5 cm fibroid over the left side  of the body of the uterus partially displacing the endometrium to the right. Adnexal regions unremarkable. Other: No free peritoneal fluid. No focal inflammatory change. Retained fecal debris over the rectum unchanged. Musculoskeletal: No focal abnormality. IMPRESSION: 1. No acute findings in the abdomen/pelvis. No evidence of active GI bleed. 2. Evidence of patient's previous partial colectomy with colostomy over the right mid abdomen unchanged. Tangle of small bowel loops over the anterior mid abdomen unchanged possibly due to adhesions. No evidence of bowel obstruction. 3. Multiple calcified and at least 1 noncalcified liver metastasis without significant change. 4. Stable 3.5 cm uterine fibroid. 5. Aortic atherosclerosis. Atherosclerotic coronary artery disease. Aortic Atherosclerosis (ICD10-I70.0). Electronically Signed   By: Toribio Agreste M.D.   On: 02/22/2024 15:52        Scheduled Meds:  gabapentin   300 mg Oral BID   hydrALAZINE   25 mg Oral Q8H   levothyroxine   25 mcg Oral Daily   Continuous Infusions:   LOS: 1 day    Time spent: 50 minutes spent on 02/23/2024 caring for this patient face-to-face including chart review, ordering labs/tests, documenting, discussion with nursing staff, consultants, updating family and interview/physical exam    Camellia PARAS Zyeir Dymek, DO [facility] Available via Epic secure chat 7am-7pm After these hours, please refer to coverage provider listed on [email or web address] 02/23/2024, 1:23 PM

## 2024-02-23 NOTE — Plan of Care (Signed)

## 2024-02-23 NOTE — Progress Notes (Signed)
 Casey Black   DOB:[birth year]   FM#:[id number]      ASSESSMENT & PLAN:  Casey Black is a 63 year old female patient who came to the ED 02/22/2024 complaining of rectal bleeding.  Oncologic history significant for colon cancer with mets.  Medical oncology following.  Rectal bleeding - Patient came to ED on 02/22/2024 complaining of profuse rectal bleeding while she was at work.  Denies dizziness or lightheadedness.  Denies abdominal or rectal pain. 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  Oxaliplatin  later discontinued due to neuropathy.  Bevacizumab  was then added.  Due to progression of disease, treatment was changed to FOLFIRI and bevacizumab  on 10/27/2023. - Medical oncology/Dr. Lanny following closely  Anemia Thrombocytopenia - Hemoglobin 10.0. - Likely multifactorial due to  bleeding + aspirin  + malignancy + recent oncologic therapy. - Recommend PRBC transfusion for hemoglobin <7.0 - Platelets 144K.  Mild.  No intervention required. - Continue to monitor CBC with differential closely  History of TIA - Has been taking aspirin  and Plavix  - Recommend hold aspirin  due to bleeding     Code Status Full  Subjective:  Patient seen awake and alert sitting up on bed in the ED.  Reports that she had profuse rectal bleeding when she came in yesterday.  States that it has eased up a lot.  Patient states she is being admitted for further workup.  Denies chest pain, abdominal pain, dizziness or lightheadedness.  No acute distress is noted.  Objective:  No intake or output data in the 24 hours ending 02/23/24 0946   PHYSICAL EXAMINATION: ECOG PERFORMANCE STATUS: 1 - Symptomatic but completely ambulatory  Vitals:   02/23/24 0554 02/23/24 0843  BP:  (!) 133/57  Pulse:  60  Resp:  16  Temp: 98.7 F (37.1 C) 97.7 F (36.5 C)  SpO2:  98%    There were no vitals filed for this visit.  GENERAL: alert, no distress and comfortable SKIN: skin color, texture, turgor are normal, no rashes or significant lesions EYES: normal, conjunctiva are pink and non-injected, sclera clear OROPHARYNX: no exudate, no erythema and lips, buccal mucosa, and tongue normal  NECK: supple, thyroid  normal size, non-tender, without nodularity LYMPH: no palpable lymphadenopathy in the cervical, axillary or inguinal LUNGS: clear to auscultation and percussion with normal breathing effort HEART: regular rate & rhythm and no murmurs and no lower extremity edema ABDOMEN: abdomen soft, non-tender and normal bowel sounds MUSCULOSKELETAL: no cyanosis of digits and no clubbing  PSYCH: alert & oriented x 3 with fluent speech NEURO: no focal motor/sensory deficits   All questions were answered. The patient knows to call the clinic with any problems, questions or concerns.   The total time spent in the appointment was 40 minutes encounter with patient including review of chart and various tests results, discussions about plan of care and coordination of care plan  Olam JINNY Brunner, NP 02/23/2024 9:46 AM    Labs Reviewed:  Lab Results  Component Value Date   WBC 2.6 (L) 02/23/2024   HGB 10.0 (L) 02/23/2024   HCT 32.3 (L) 02/23/2024   MCV 95.8 02/23/2024   PLT 144 (L) 02/23/2024   Recent Labs    02/12/24 2040 02/12/24 2047 02/16/24 1102 02/22/24 1325  NA 136 137 138 136  K 4.4 4.8 4.4 5.1  CL 103 103 105 104  CO2 22  --  26 25  GLUCOSE 94 87 82 87  BUN 18 22 23 23   CREATININE 1.13* 1.20* 1.06* 0.91  CALCIUM  9.7  --  10.0 9.8  GFRNONAA 55*  --  59* >60  PROT 7.0  --  7.6 7.1  ALBUMIN 3.3*  --  3.9 3.7  AST 24  --  29 21  ALT 16  --  20 21  ALKPHOS 92  --  104 109  BILITOT 0.3  --  0.5 0.5    Studies Reviewed:  CT ANGIO GI BLEED Result Date: 02/22/2024 CLINICAL DATA:  Possible lower GI bleed. Passing clots per rectum this morning. History  of stage IV colon cancer. Taking aspirin . EXAM: CTA ABDOMEN AND PELVIS WITHOUT AND WITH CONTRAST TECHNIQUE: Multidetector CT imaging of the abdomen and pelvis was performed using the standard protocol during bolus administration of intravenous contrast. Multiplanar reconstructed images and MIPs were obtained and reviewed to evaluate the vascular anatomy. RADIATION DOSE REDUCTION: This exam was performed according to the departmental dose-optimization program which includes automated exposure control, adjustment of the mA and/or kV according to patient size and/or use of iterative reconstruction technique. CONTRAST:  OMNIPAQUE  IOHEXOL  350 MG/ML SOLN COMPARISON:  01/26/2024 FINDINGS: VASCULAR Aorta: Abdominal aorta is normal in caliber without aneurysm or dissection. Mild calcified plaque over the abdominal aorta. Celiac: Patent without evidence of aneurysm, dissection, vasculitis or significant stenosis. SMA: Mild calcified plaque over the superior mesenteric artery which is otherwise patent without significant stenosis or occlusion. Renals: Calcified plaque at the origin the right renal artery with minimal focal narrowing as the right renal artery is otherwise patent. Moderate focal narrowing over the proximal 1 cm of the left renal artery which is otherwise patent. Small inferior accessory left renal artery. IMA: Patent without evidence of aneurysm, dissection, vasculitis or significant stenosis. Inflow: Patent without evidence of aneurysm, dissection, vasculitis or significant stenosis. Proximal Outflow: Bilateral common femoral and visualized portions of the superficial and profunda femoral arteries are patent without evidence of aneurysm, dissection, vasculitis or significant stenosis. Veins: No obvious venous abnormality within the limitations of this arterial phase study. Review of the MIP images confirms the above findings. NON-VASCULAR Lower chest: Heart is normal size. Calcified plaque over the 3  vessel coronary arteries. Visualized lung bases are clear. Hepatobiliary: Multiple calcified and at least 1 noncalcified liver metastasis without significant change. Gallbladder is normal. Minimal prominence of the central intrahepatic ducts unchanged. Pancreas: Unremarkable. No pancreatic ductal dilatation or surrounding inflammatory changes. Spleen: Normal in size without focal abnormality. Adrenals/Urinary Tract: Adrenal glands are normal. Kidneys are normal size without hydronephrosis or nephrolithiasis. No focal renal mass. Ureters and bladder are normal. Stomach/Bowel: Stomach is normal. Small bowel is unremarkable. Tangle of small bowel loops over the anterior mid abdomen unchanged possibly due to adhesions. Evidence of patient's previous partial colectomy. Colostomy over the right mid abdomen unchanged. Appendix is normal. Cecum and ileocecal valve located within a peristomal hernia at the ostomy site unchanged. Lymphatic: No adenopathy. Reproductive: Uterus just right of midline with stable 3.5 cm fibroid over the left side of the body of the uterus partially displacing the endometrium to the right. Adnexal regions unremarkable. Other: No free peritoneal fluid. No focal inflammatory change. Retained fecal debris over the rectum unchanged. Musculoskeletal: No focal abnormality. IMPRESSION: 1. No acute findings in the abdomen/pelvis. No evidence of active GI bleed. 2. Evidence of patient's previous partial colectomy with colostomy over the right mid abdomen unchanged. Tangle of small bowel loops over the  anterior mid abdomen unchanged possibly due to adhesions. No evidence of bowel obstruction. 3. Multiple calcified and at least 1 noncalcified liver metastasis without significant change. 4. Stable 3.5 cm uterine fibroid. 5. Aortic atherosclerosis. Atherosclerotic coronary artery disease. Aortic Atherosclerosis (ICD10-I70.0). Electronically Signed   By: Toribio Agreste M.D.   On: 02/22/2024 15:52   MR CERVICAL  SPINE W WO CONTRAST Result Date: 02/13/2024 CLINICAL DATA:  Initial evaluation for left arm and neck numbness. Metastatic disease evaluation. EXAM: MRI CERVICAL SPINE WITHOUT AND WITH CONTRAST TECHNIQUE: Multiplanar and multiecho pulse sequences of the cervical spine, to include the craniocervical junction and cervicothoracic junction, were obtained without and with intravenous contrast. CONTRAST:  10mL GADAVIST GADOBUTROL 1 MMOL/ML IV SOLN COMPARISON:  None Available. FINDINGS: Alignment: Straightening of the normal cervical lordosis. No listhesis. Vertebrae: Vertebral body height maintained without acute or chronic fracture. Mildly decreased T1 signal intensity within the visualized bone marrow, nonspecific, but most commonly related to anemia, smoking, or obesity. Subcentimeter benign hemangioma noted within the T1 vertebral body. No worrisome osseous lesions or evidence for metastatic disease. Mild degenerative reactive endplate changes present about the C4-5 interspace. No other abnormal marrow edema or enhancement. Cord: Normal signal and morphology.  No abnormal enhancement. Posterior Fossa, vertebral arteries, paraspinal tissues: Chronic pontine lacunar infarct noted. Visualized brain and posterior fossa otherwise unremarkable. Paraspinous soft tissues within normal limits. Normal flow voids seen within the vertebral arteries bilaterally. Disc levels: C2-C3: Normal interspace. Mild left-sided facet hypertrophy. No canal or foraminal stenosis. C3-C4: Mild disc bulge with uncovertebral spurring. Mild left-sided facet hypertrophy. No significant spinal stenosis. Foramina remain patent. C4-C5: Degenerative intervertebral disc space narrowing. Right paracentral disc osteophyte complex indents the right ventral thecal sac (series 8, image 22). Mild cord flattening without cord signal changes. No significant spinal stenosis. Superimposed uncovertebral spurring without significant foraminal encroachment. C5-C6:  Degenerative intervertebral disc space narrowing. Diffuse disc bulge with bilateral uncovertebral spurring. No significant spinal stenosis. Foramina remain adequately patent. C6-C7: Diffuse disc bulge with bilateral uncovertebral spurring. No spinal stenosis. Foramina remain adequately patent. C7-T1: Small left foraminal disc osteophyte complex (series 5, image 11). Mild bilateral facet hypertrophy. No spinal stenosis. Mild left C8 foraminal narrowing. Right neural foramina remains patent. IMPRESSION: 1. No evidence for metastatic disease within the cervical spine. 2. Small left foraminal disc osteophyte complex at C7-T1, potentially irritating the left C8 nerve root. 3. Right paracentral disc osteophyte complex at C4-5 with resultant mild cord flattening, but no cord signal changes or significant stenosis. 4. Additional mild noncompressive disc bulging at C3-4 through C6-7 without significant stenosis or impingement. 5. Chronic pontine lacunar infarct. Electronically Signed   By: Morene Hoard M.D.   On: 02/13/2024 21:27   ECHOCARDIOGRAM COMPLETE Result Date: 02/13/2024    ECHOCARDIOGRAM REPORT   Patient Name:   DALI KRANER Date of Exam: 02/13/2024 Medical Rec #:  [id number]         Height:       67.5 in Accession #:    [id number]        Weight:       238.0 lb Date of Birth:  October 29, 1960        BSA:          2.189 m Patient Age:    62 years          BP:           138/62 mmHg Patient Gender: F  HR:           58 bpm. Exam Location:  Inpatient Procedure: 2D Echo, Cardiac Doppler and Color Doppler (Both Spectral and Color            Flow Doppler were utilized during procedure). Indications:    Stroke  History:        Patient has prior history of Echocardiogram examinations, most                 recent 11/04/2021. Risk Factors:Hypertension.  Sonographer:    Carmelita Hartshorn RDCS, FE, PE Referring Phys: [id number] VASUNDHRA RATHORE IMPRESSIONS  1. Left ventricular ejection fraction, by estimation,  is 65 to 70%. The left ventricle has normal function. The left ventricle has no regional wall motion abnormalities. There is mild concentric left ventricular hypertrophy. Left ventricular diastolic parameters are consistent with Grade I diastolic dysfunction (impaired relaxation).  2. Right ventricular systolic function is normal. The right ventricular size is normal.  3. The mitral valve is degenerative. Trivial mitral valve regurgitation. No evidence of mitral stenosis.  4. The aortic valve is tricuspid. Aortic valve regurgitation is not visualized. Aortic valve sclerosis/calcification is present, without any evidence of aortic stenosis.  5. The inferior vena cava is normal in size with greater than 50% respiratory variability, suggesting right atrial pressure of 3 mmHg. Conclusion(s)/Recommendation(s): No intracardiac source of embolism detected on this transthoracic study. Consider a transesophageal echocardiogram to exclude cardiac source of embolism if clinically indicated. FINDINGS  Left Ventricle: Left ventricular ejection fraction, by estimation, is 65 to 70%. The left ventricle has normal function. The left ventricle has no regional wall motion abnormalities. The left ventricular internal cavity size was normal in size. There is  mild concentric left ventricular hypertrophy. Left ventricular diastolic parameters are consistent with Grade I diastolic dysfunction (impaired relaxation). Normal left ventricular filling pressure. Right Ventricle: The right ventricular size is normal. No increase in right ventricular wall thickness. Right ventricular systolic function is normal. Left Atrium: Left atrial size was normal in size. Right Atrium: Right atrial size was normal in size. Pericardium: There is no evidence of pericardial effusion. Mitral Valve: The mitral valve is degenerative in appearance. There is moderate thickening of the mitral valve leaflet(s). There is mild calcification of the mitral valve  leaflet(s). Mild mitral annular calcification. Trivial mitral valve regurgitation. No evidence of mitral valve stenosis. Tricuspid Valve: The tricuspid valve is normal in structure. Tricuspid valve regurgitation is trivial. No evidence of tricuspid stenosis. Aortic Valve: The aortic valve is tricuspid. Aortic valve regurgitation is not visualized. Aortic valve sclerosis/calcification is present, without any evidence of aortic stenosis. Pulmonic Valve: The pulmonic valve was normal in structure. Pulmonic valve regurgitation is trivial. No evidence of pulmonic stenosis. Aorta: The aortic root is normal in size and structure. Venous: The inferior vena cava is normal in size with greater than 50% respiratory variability, suggesting right atrial pressure of 3 mmHg. IAS/Shunts: No atrial level shunt detected by color flow Doppler.  LEFT VENTRICLE PLAX 2D LVIDd:         4.40 cm   Diastology LVIDs:         2.90 cm   LV e' medial:    6.42 cm/s LV PW:         1.10 cm   LV E/e' medial:  10.4 LV IVS:        1.20 cm   LV e' lateral:   5.33 cm/s LVOT diam:     2.12 cm  LV E/e' lateral: 12.5 LV SV:         97 LV SV Index:   44 LVOT Area:     3.53 cm  RIGHT VENTRICLE RV S prime:     13.80 cm/s LEFT ATRIUM             Index        RIGHT ATRIUM           Index LA diam:        3.57 cm 1.63 cm/m   RA Area:     12.00 cm LA Vol (A2C):   63.7 ml 29.09 ml/m  RA Volume:   25.20 ml  11.51 ml/m LA Vol (A4C):   80.0 ml 36.54 ml/m LA Biplane Vol: 72.4 ml 33.07 ml/m  AORTIC VALVE LVOT Vmax:   123.00 cm/s LVOT Vmean:  79.700 cm/s LVOT VTI:    0.276 m  AORTA Ao Root diam: 2.78 cm Ao Asc diam:  2.37 cm MITRAL VALVE               TRICUSPID VALVE MV Area (PHT): 2.66 cm    TR Peak grad:   40.2 mmHg MV Decel Time: 285 msec    TR Vmax:        317.00 cm/s MV E velocity: 66.60 cm/s MV A velocity: 98.00 cm/s  SHUNTS MV E/A ratio:  0.68        Systemic VTI:  0.28 m                            Systemic Diam: 2.12 cm Wilbert Bihari MD Electronically  signed by Wilbert Bihari MD Signature Date/Time: 02/13/2024/9:19:02 AM    Final    CT ANGIO HEAD NECK W WO CM Result Date: 02/13/2024 EXAM: CTA HEAD AND NECK WITHOUT AND WITH 02/13/2024 03:23:38 AM TECHNIQUE: CTA of the head and neck was performed without and with the administration of intravenous contrast. Multiplanar 2D and/or 3D reformatted images are provided for review. Automated exposure control, iterative reconstruction, and/or weight based adjustment of the mA/kV was utilized to reduce the radiation dose to as low as reasonably achievable. Stenosis of the internal carotid arteries measured using NASCET criteria. COMPARISON: None available CLINICAL HISTORY: Neuro deficit, acute, stroke suspected FINDINGS: AORTIC ARCH AND ARCH VESSELS: No dissection or arterial injury. No significant stenosis of the brachiocephalic or subclavian arteries. CERVICAL CAROTID ARTERIES: Approximately 50% stenosis of the right internal carotid artery at the skull base. Approximately 70% stenosis of the right internal carotid artery at the skull base. CERVICAL VERTEBRAL ARTERIES: Patent vertebral arteries. Moderate right proximal V2 verebtral artery stenosis. 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 LUNGS AND MEDIASTINUM: Unremarkable. SOFT TISSUES: No acute abnormality. BONES: No acute abnormality. ANTERIOR CIRCULATION: Severe bilateral cavernous ICA stenosis. No significant stenosis of the anterior cerebral arteries. No significant stenosis of the middle cerebral arteries. No aneurysm. POSTERIOR CIRCULATION: No significant stenosis of the posterior cerebral arteries. No significant stenosis of the basilar artery. No significant stenosis of the vertebral arteries. No aneurysm. OTHER: No dural venous sinus thrombosis on this non-dedicated study. IMPRESSION: 1. No large vessel occlusion. 2. Severe bilateral cavernous ICA stenosis. 3. Stenoses of the ICAs at the skull base, 70% on the left and 50% on the right. 4.  Moderate right proximal V2 verebtral artery stenosis. 5. 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 Electronically signed by: Gilmore Molt MD 02/13/2024 03:51 AM EST RP  Workstation: [id number]   MR BRAIN W WO CONTRAST Result Date: 02/13/2024 EXAM: MRI Brain With and Without Contrast 02/13/2024 01:18:47 AM TECHNIQUE: Multiplanar multisequence MRI of the head/brain was performed with and without the administration of intravenous contrast. COMPARISON: CT head 12/13/2023 CLINICAL HISTORY: Metastatic disease evaluation; Neuro deficit, acute, stroke suspected FINDINGS: BRAIN AND VENTRICLES: No acute infarct. No acute intracranial hemorrhage. No mass effect or midline shift. No hydrocephalus. Normal flow voids. No mass or abnormal enhancement. Small remote right thalamic lacunar infarct and mild chronic microvascular ischemic change. ORBITS: No acute abnormality. SINUSES: No acute abnormality. BONES AND SOFT TISSUES: Normal bone marrow signal and enhancement. No acute soft tissue abnormality. IMPRESSION: 1. No evidence of acute intracranial abnormality or metastatic disease. Electronically signed by: Gilmore Molt MD 02/13/2024 02:09 AM EST RP Workstation: [id number]   DG CHEST PORT 1 VIEW Result Date: 02/12/2024 EXAM: 1 VIEW(S) XRAY OF THE CHEST 02/12/2024 11:44:38 PM COMPARISON: Chest CT 01/26/2024 CLINICAL HISTORY: Chest pain. FINDINGS: LINES, TUBES AND DEVICES: Right chest port catheter tip is over the SVC. LUNGS AND PLEURA: No focal pulmonary opacity. No pulmonary edema. No pleural effusion. No pneumothorax. HEART AND MEDIASTINUM: Cardiac silhouette appears borderline enlarged, a new finding. BONES AND SOFT TISSUES: No acute osseous abnormality. IMPRESSION: 1. No acute findings. 2. New borderline enlarged cardiac silhouette; correlate clinically. Electronically signed by: Greig Pique MD 02/12/2024 11:58 PM EST RP Workstation: [id number]   CT HEAD CODE STROKE WO CONTRAST (LKW  0-4.5h, LVO 0-24h) Result Date: 02/12/2024 EXAM: CT HEAD WITHOUT CONTRAST 02/12/2024 08:57:06 PM TECHNIQUE: CT of the head was performed without the administration of intravenous contrast. Automated exposure control, iterative reconstruction, and/or weight based adjustment of the mA/kV was utilized to reduce the radiation dose to as low as reasonably achievable. COMPARISON: None available. CLINICAL HISTORY: Neuro deficit, acute, stroke suspected. FINDINGS: BRAIN AND VENTRICLES: No acute hemorrhage. No evidence of acute infarct. No hydrocephalus. No extra-axial collection. No mass effect or midline shift. ORBITS: No acute abnormality. SINUSES: No acute abnormality. SOFT TISSUES AND SKULL: No acute soft tissue abnormality. No skull fracture. Findings conveyed to Dr. Vanessa via pager at 9:05 PM IMPRESSION: 1. No acute intracranial abnormality. Electronically signed by: Gilmore Molt MD 02/12/2024 09:06 PM EST RP Workstation: [id number]   CT CHEST ABDOMEN PELVIS W CONTRAST Result Date: 01/28/2024 CLINICAL DATA:  Metastatic colon cancer, assess treatment response * Tracking Code: BO * EXAM: CT CHEST, ABDOMEN, AND PELVIS WITH CONTRAST TECHNIQUE: Multidetector CT imaging of the chest, abdomen and pelvis was performed following the standard protocol during bolus administration of intravenous contrast. RADIATION DOSE REDUCTION: This exam was performed according to the departmental dose-optimization program which includes automated exposure control, adjustment of the mA and/or kV according to patient size and/or use of iterative reconstruction technique. CONTRAST:  OMNIPAQUE  IOHEXOL  300 MG/ML  SOLN COMPARISON:  09/29/2023 FINDINGS: CT CHEST FINDINGS Cardiovascular: Right chest port catheter. Aortic atherosclerosis. Normal heart size. Three-vessel coronary artery calcifications. No pericardial effusion. Mediastinum/Nodes: Unchanged enlarged pretracheal lymph nodes measuring up to 1.6 x 1.1 cm (series 2, image  24). Thyroid  gland, trachea, and esophagus demonstrate no significant findings. Lungs/Pleura: Mild centrilobular emphysema. Diffuse bilateral bronchial wall thickening. No pleural effusion or pneumothorax. Musculoskeletal: No chest wall abnormality. No acute osseous findings. CT ABDOMEN PELVIS FINDINGS Hepatobiliary: Interval enlargement of noncalcified hypodense metastasis in the central left lobe of the liver, hepatic segment IV, measuring 2.7 x 2.3 cm, previously 2.3 x 2.0 cm (series 2, image 62). Multiple additional calcified and noncalcified metastases throughout  the liver are unchanged. Cholecystectomy. Unchanged mild postoperative biliary ductal dilatation. Pancreas: Unremarkable. No pancreatic ductal dilatation or surrounding inflammatory changes. Spleen: Normal in size without significant abnormality. Adrenals/Urinary Tract: Adrenal glands are unremarkable. Kidneys are normal, without renal calculi, solid lesion, or hydronephrosis. Bladder is unremarkable. Stomach/Bowel: Stomach is within normal limits. Appendix appears normal. No evidence of bowel wall thickening, distention, or inflammatory changes. Vascular/Lymphatic: Aortic atherosclerosis. No enlarged abdominal or pelvic lymph nodes. Reproductive: No mass or other abnormality. 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 Small fat containing bilateral inguinal hernias. No ascites. Musculoskeletal: No acute osseous findings. IMPRESSION: 1. Interval enlargement of noncalcified hypodense metastasis in the central left lobe of the liver, hepatic segment IV. Multiple additional calcified and noncalcified metastases throughout the liver are unchanged. 2. Unchanged enlarged pretracheal lymph nodes. 3. 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 4. Emphysema and diffuse bilateral bronchial wall thickening. 5. Coronary artery disease. Aortic Atherosclerosis (ICD10-I70.0) and Emphysema (ICD10-J43.9). Electronically Signed    By: Marolyn JONETTA Jaksch M.D.   On: 01/28/2024 23:09

## 2024-02-23 NOTE — ED Notes (Signed)
 Pt provided with new ostomy bag per request.

## 2024-02-23 NOTE — ED Notes (Signed)
 Visitors at bedside.

## 2024-02-23 NOTE — ED Notes (Signed)
PT denies any needs at this time . 

## 2024-02-24 ENCOUNTER — Ambulatory Visit

## 2024-02-24 VITALS — BP 140/74 | HR 90 | Temp 98.8°F | Ht 67.5 in | Wt 232.2 lb

## 2024-02-24 DIAGNOSIS — Z0000 Encounter for general adult medical examination without abnormal findings: Secondary | ICD-10-CM | POA: Diagnosis not present

## 2024-02-24 DIAGNOSIS — K5791 Diverticulosis of intestine, part unspecified, without perforation or abscess with bleeding: Secondary | ICD-10-CM | POA: Diagnosis not present

## 2024-02-24 LAB — CBC
HCT: 31.8 % — ABNORMAL LOW (ref 36.0–46.0)
Hemoglobin: 9.8 g/dL — ABNORMAL LOW (ref 12.0–15.0)
MCH: 29.6 pg (ref 26.0–34.0)
MCHC: 30.8 g/dL (ref 30.0–36.0)
MCV: 96.1 fL (ref 80.0–100.0)
Platelets: 136 K/uL — ABNORMAL LOW (ref 150–400)
RBC: 3.31 MIL/uL — ABNORMAL LOW (ref 3.87–5.11)
RDW: 14.6 % (ref 11.5–15.5)
WBC: 3.1 K/uL — ABNORMAL LOW (ref 4.0–10.5)
nRBC: 0 % (ref 0.0–0.2)

## 2024-02-24 LAB — BASIC METABOLIC PANEL WITH GFR
Anion gap: 7 (ref 5–15)
BUN: 15 mg/dL (ref 8–23)
CO2: 27 mmol/L (ref 22–32)
Calcium: 9.8 mg/dL (ref 8.9–10.3)
Chloride: 103 mmol/L (ref 98–111)
Creatinine, Ser: 0.87 mg/dL (ref 0.44–1.00)
GFR, Estimated: >60 mL/min (ref >60)
Glucose, Bld: 86 mg/dL (ref 70–99)
Potassium: 4.6 mmol/L (ref 3.5–5.1)
Sodium: 137 mmol/L (ref 135–145)

## 2024-02-24 MED ORDER — HEPARIN SOD (PORK) LOCK FLUSH 100 UNIT/ML IV SOLN
500.0000 [IU] | INTRAVENOUS | Status: AC | PRN
  Administered 2024-02-24: 500 [IU]

## 2024-02-24 NOTE — Consult Note (Signed)
 [facility]  Coordinate care with the primary nurse and consult for ostomy; established Colostomy, nursing staff requesting ostomy care and management orders; these were placed to guide nursing staff, chart was reviewed.   2-piece 2 1/4 appliance #234 pouch, #644 barrier/wafer, barrier ring [phone]   [facility] will not follow and will remove patient from census task list. If ostomy or peristomal issues occur please send another consult.  Sherrilyn Hals MSN RN [facility]  [phone] (Available from 7-3 pm Mon-Friday)

## 2024-02-24 NOTE — Patient Instructions (Addendum)
 Casey Black,  Thank you for taking the time for your Medicare Wellness Visit. I appreciate your continued commitment to your health goals. Please review the care plan we discussed, and feel free to reach out if I can assist you further.  Please note that Annual Wellness Visits do not include a physical exam. Some assessments may be limited, especially if the visit was conducted virtually. If needed, we may recommend an in-person follow-up with your provider.  Ongoing Care Seeing your primary care provider every 3 to 6 months helps us  monitor your health and provide consistent, personalized care.    Referrals If a referral was made during today's visit and you haven't received any updates within two weeks, please contact the referred provider directly to check on the status.  Recommended Screenings:  Health Maintenance  Topic Date Due   Pneumococcal Vaccine for age over 87 (1 of 2 - PCV) Never done   COVID-19 Vaccine (3 - Pfizer risk series) 12/06/2019   Breast Cancer Screening  11/07/2024   Pap with HPV screening  01/28/2025   Medicare Annual Wellness Visit  02/23/2025   DTaP/Tdap/Td vaccine (2 - Td or Tdap) 05/08/2031   Flu Shot  Completed   Hepatitis C Screening  Completed   HIV Screening  Completed   Hepatitis B Vaccine  Aged Out   HPV Vaccine  Aged Out   Meningitis B Vaccine  Aged Out   Colon Cancer Screening  Discontinued   Zoster (Shingles) Vaccine  Discontinued       02/24/2024    3:04 PM  Advanced Directives  Does Patient Have a Medical Advance Directive? No  Would patient like information on creating a medical advance directive? No - Patient declined    Vision: Annual vision screenings are recommended for early detection of glaucoma, cataracts, and diabetic retinopathy. These exams can also reveal signs of chronic conditions such as diabetes and high blood pressure.  Dental: Annual dental screenings help detect early signs of oral cancer, gum disease, and other  conditions linked to overall health, including heart disease and diabetes.  Please see the attached documents for additional preventive care recommendations.

## 2024-02-24 NOTE — Discharge Summary (Signed)
 Physician Discharge Summary  Casey Black FMW:[id number] DOB: 13-Oct-1960 DOA: 02/22/2024  PCP: [name], [name], MD  Admit date: 02/22/2024 Discharge date: 02/24/2024  Admitted From: Home Disposition: Home  Recommendations for Outpatient Follow-up:  Follow up with PCP in 1-2 weeks Discontinue Plavix , hold aspirin  x 1 week until follow-up with PCP for repeat CBC to ensure hemoglobin stable before considering restarting. Consider outpatient follow-up with gastroenterology if recurrence  Home Health: No Equipment/Devices: None  Discharge Condition: Stable CODE STATUS: Full code Diet recommendation: Heart healthy diet  History of present illness:  Casey Black is a 63 y.o. female with past medical history significant for HTN, stage IV colon cancer with liver and nodal metastasis on chemotherapy, previous SBO s/p partial colectomy with right colostomy, iron deficiency anemia, hypothyroidism, anxiety who presented to [facility] ED on 02/22/2024 with complaints of rectal bleeding.  Patient reports was at her job while she was sitting and felt some liquid to soak her bottom.  When she stood up she found maroon-colored fluid staining to the cushion; then she went to the bathroom with more maroon-colored liquid stool mixed with clot.  Patient denies any abdominal pain, no shortness of breath and no lightheadedness.  Patient denies any blood noted in her colostomy bag.   Recently admitted to [facility] for TIA, workup including MRI was negative and patient was discharged on DAPT with aspirin  and Plavix  for 21 days, last Plavix  use was morning of 11/18.   In the ED, temperature 98.1 F, HR 90, RR 18, BP 177/74, SpO2 99% room air.  WBC 2.6, hemoglobin 10.6, platelet count 160.  Sodium 136, potassium 5.1, chloride 104, CO2 25, glucose 87, BUN 23, creatinine 0.91.  AST 21, ALT 21, total bilirubin 0.5.  FOBT negative (unclear if collected from ostomy versus rectum).  CT  angiogram GI bleed study with no acute findings in abdomen/pelvis, no evidence of active GI bleed, noted patient's previous partial colectomy with colostomy of the right midabdomen unchanged, tangle of small bowel loops over the anterior mid abdomen unchanged possibly due to adhesions, no evidence of bowel obstruction.  TRH consulted for admission for further evaluation management of LGIB.  Hospital course:  LGIB likely secondary to diverticular versus hemorrhoidal from remnant section of colon History of SBO s/p partial colectomy with right colostomy Patient reports maroon-colored blood from her rectum.  Recently started on DAPT with aspirin /Plavix  after recent TIA workup.  Denies any blood in her ostomy.  FOBT negative (unclear if collected from ostomy versus rectum). Hemoglobin 10.6 at time of arrival.  CT angiogram GI bleed study with no acute findings in abdomen/pelvis.  Patient's aspirin  and Plavix  were held during hospitalization.  Patient denied any further blood from rectum during hospitalization and hemoglobin remained stable, 9.8 at time of discharge.  Discontinue Plavix .  Hold aspirin  x 1 week and to follow-up with PCP with repeat labs before considering restarting.  Consider outpatient follow-up with GI if further recurrence.   Thrombocytopenia Platelet count 136, stable   History of recent TIA Recent started on DAPT x 21 days with aspirin /Plavix .  Discontinue Plavix  as above.  Holding aspirin .  Outpatient follow-up with PCP/neurology.   Stage IV colon cancer with liver/nodal metastasis Follows with medical oncology outpatient, Dr. Lanny.  Diagnosed 2020. Status post chemotherapy with 5-FU + leucovorin  + Avastin . Oxaliplatin  later discontinued due to neuropathy. Bevacizumab  was then added. Due to progression of disease, treatment was changed to FOLFIRI and bevacizumab  on 10/27/2023. Continue outpatient follow-up  Discharge Diagnoses:  Principal Problem:   GI bleed Active Problems:    Lower GI bleed    Discharge Instructions  Discharge Instructions     Call MD for:  difficulty breathing, headache or visual disturbances   Complete by: As directed    Call MD for:  extreme fatigue   Complete by: As directed    Call MD for:  persistant dizziness or light-headedness   Complete by: As directed    Call MD for:  persistant nausea and vomiting   Complete by: As directed    Call MD for:  severe uncontrolled pain   Complete by: As directed    Call MD for:  temperature >100.4   Complete by: As directed    Diet - low sodium heart healthy   Complete by: As directed    Increase activity slowly   Complete by: As directed       Allergies as of 02/24/2024   No Known Allergies      Medication List     PAUSE taking these medications    aspirin  EC 81 MG tablet Wait to take this until your doctor or other care provider tells you to start again. Take 1 tablet (81 mg total) by mouth daily. Swallow whole. What changed: when to take this       STOP taking these medications    clopidogrel  75 MG tablet Commonly known as: PLAVIX        TAKE these medications    acetaminophen  325 MG tablet Commonly known as: TYLENOL  Take 2 tablets (650 mg total) by mouth every 6 (six) hours as needed. What changed: reasons to take this   amLODipine  10 MG tablet Commonly known as: NORVASC  TAKE 1 TABLET BY MOUTH EVERY DAY   dexamethasone  4 MG tablet Commonly known as: DECADRON  Take 2 tablets (8 mg total) by mouth daily. Start the day after chemotherapy for 2 days. Take with food.   ferrous sulfate  325 (65 FE) MG tablet TAKE 1 TABLET BY MOUTH TWICE A DAY WITH FOOD   gabapentin  300 MG capsule Commonly known as: NEURONTIN  TAKE 1 CAPSULE BY MOUTH TWICE A DAY What changed: when to take this   hydrALAZINE  25 MG tablet Commonly known as: APRESOLINE  TAKE 1 TABLET BY MOUTH EVERY 8 HOURS What changed: when to take this   hydrochlorothiazide  12.5 MG tablet Commonly known as:  HYDRODIURIL  TAKE 1 TABLET BY MOUTH EVERY DAY What changed: when to take this   isosorbide  mononitrate 30 MG 24 hr tablet Commonly known as: IMDUR  TAKE 1 TABLET BY MOUTH EVERY DAY   levothyroxine  25 MCG tablet Commonly known as: SYNTHROID  Take 25 mcg by mouth daily before breakfast. What changed: Another medication with the same name was removed. Continue taking this medication, and follow the directions you see here.   lidocaine -prilocaine  cream Commonly known as: EMLA  APPLY 1 APPLICATION TOPICALLY AS NEEDED. What changed:  how much to take reasons to take this   loperamide  2 MG capsule Commonly known as: IMODIUM  Take 2 tabs by mouth with first loose stool, then 1 tab with each additional loose stool as needed. Do not exceed 8 tabs in a 24-hour period What changed:  how much to take how to take this when to take this additional instructions   ondansetron  8 MG tablet Commonly known as: ZOFRAN  Take 1 tablet (8 mg total) by mouth every 8 (eight) hours as needed for nausea, vomiting or refractory nausea / vomiting. Start on the third day after chemotherapy.  prochlorperazine  10 MG tablet Commonly known as: COMPAZINE  Take 1 tablet (10 mg total) by mouth every 6 (six) hours as needed for nausea or vomiting.   Vitamin D  (Ergocalciferol ) 1.25 MG (50000 UNIT) Caps capsule Commonly known as: DRISDOL  Take 1 capsule (50,000 Units total) by mouth every 7 (seven) days for 12 doses. What changed: when to take this        Follow-up Information     [name], [name], MD. Schedule an appointment as soon as possible for a visit in 1 week(s).   Specialty: Internal Medicine Why: Repeat CBC Contact information: [address] [place] [facility] [place] [address] [phone]                No Known Allergies  Consultations: None   Procedures/Studies: CT ANGIO GI BLEED Result Date: 02/22/2024 CLINICAL DATA:  Possible lower GI bleed. Passing clots per rectum this  morning. History of stage IV colon cancer. Taking aspirin . EXAM: CTA ABDOMEN AND PELVIS WITHOUT AND WITH CONTRAST TECHNIQUE: Multidetector CT imaging of the abdomen and pelvis was performed using the standard protocol during bolus administration of intravenous contrast. Multiplanar reconstructed images and MIPs were obtained and reviewed to evaluate the vascular anatomy. RADIATION DOSE REDUCTION: This exam was performed according to the departmental dose-optimization program which includes automated exposure control, adjustment of the mA and/or kV according to patient size and/or use of iterative reconstruction technique. CONTRAST:  OMNIPAQUE  IOHEXOL  350 MG/ML SOLN COMPARISON:  01/26/2024 FINDINGS: VASCULAR Aorta: Abdominal aorta is normal in caliber without aneurysm or dissection. Mild calcified plaque over the abdominal aorta. Celiac: Patent without evidence of aneurysm, dissection, vasculitis or significant stenosis. SMA: Mild calcified plaque over the superior mesenteric artery which is otherwise patent without significant stenosis or occlusion. Renals: Calcified plaque at the origin the right renal artery with minimal focal narrowing as the right renal artery is otherwise patent. Moderate focal narrowing over the proximal 1 cm of the left renal artery which is otherwise patent. Small inferior accessory left renal artery. IMA: Patent without evidence of aneurysm, dissection, vasculitis or significant stenosis. Inflow: Patent without evidence of aneurysm, dissection, vasculitis or significant stenosis. Proximal Outflow: Bilateral common femoral and visualized portions of the superficial and profunda femoral arteries are patent without evidence of aneurysm, dissection, vasculitis or significant stenosis. Veins: No obvious venous abnormality within the limitations of this arterial phase study. Review of the MIP images confirms the above findings. NON-VASCULAR Lower chest: Heart is normal size. Calcified  plaque over the 3 vessel coronary arteries. Visualized lung bases are clear. Hepatobiliary: Multiple calcified and at least 1 noncalcified liver metastasis without significant change. Gallbladder is normal. Minimal prominence of the central intrahepatic ducts unchanged. Pancreas: Unremarkable. No pancreatic ductal dilatation or surrounding inflammatory changes. Spleen: Normal in size without focal abnormality. Adrenals/Urinary Tract: Adrenal glands are normal. Kidneys are normal size without hydronephrosis or nephrolithiasis. No focal renal mass. Ureters and bladder are normal. Stomach/Bowel: Stomach is normal. Small bowel is unremarkable. Tangle of small bowel loops over the anterior mid abdomen unchanged possibly due to adhesions. Evidence of patient's previous partial colectomy. Colostomy over the right mid abdomen unchanged. Appendix is normal. Cecum and ileocecal valve located within a peristomal hernia at the ostomy site unchanged. Lymphatic: No adenopathy. Reproductive: Uterus just right of midline with stable 3.5 cm fibroid over the left side of the body of the uterus partially displacing the endometrium to the right. Adnexal regions unremarkable. Other: No free peritoneal fluid. No focal inflammatory change. Retained fecal  debris over the rectum unchanged. Musculoskeletal: No focal abnormality. IMPRESSION: 1. No acute findings in the abdomen/pelvis. No evidence of active GI bleed. 2. Evidence of patient's previous partial colectomy with colostomy over the right mid abdomen unchanged. Tangle of small bowel loops over the anterior mid abdomen unchanged possibly due to adhesions. No evidence of bowel obstruction. 3. Multiple calcified and at least 1 noncalcified liver metastasis without significant change. 4. Stable 3.5 cm uterine fibroid. 5. Aortic atherosclerosis. Atherosclerotic coronary artery disease. Aortic Atherosclerosis (ICD10-I70.0). Electronically Signed   By: Toribio Agreste M.D.   On: 02/22/2024  15:52   MR CERVICAL SPINE W WO CONTRAST Result Date: 02/13/2024 CLINICAL DATA:  Initial evaluation for left arm and neck numbness. Metastatic disease evaluation. EXAM: MRI CERVICAL SPINE WITHOUT AND WITH CONTRAST TECHNIQUE: Multiplanar and multiecho pulse sequences of the cervical spine, to include the craniocervical junction and cervicothoracic junction, were obtained without and with intravenous contrast. CONTRAST:  10mL GADAVIST  GADOBUTROL  1 MMOL/ML IV SOLN COMPARISON:  None Available. FINDINGS: Alignment: Straightening of the normal cervical lordosis. No listhesis. Vertebrae: Vertebral body height maintained without acute or chronic fracture. Mildly decreased T1 signal intensity within the visualized bone marrow, nonspecific, but most commonly related to anemia, smoking, or obesity. Subcentimeter benign hemangioma noted within the T1 vertebral body. No worrisome osseous lesions or evidence for metastatic disease. Mild degenerative reactive endplate changes present about the C4-5 interspace. No other abnormal marrow edema or enhancement. Cord: Normal signal and morphology.  No abnormal enhancement. Posterior Fossa, vertebral arteries, paraspinal tissues: Chronic pontine lacunar infarct noted. Visualized brain and posterior fossa otherwise unremarkable. Paraspinous soft tissues within normal limits. Normal flow voids seen within the vertebral arteries bilaterally. Disc levels: C2-C3: Normal interspace. Mild left-sided facet hypertrophy. No canal or foraminal stenosis. C3-C4: Mild disc bulge with uncovertebral spurring. Mild left-sided facet hypertrophy. No significant spinal stenosis. Foramina remain patent. C4-C5: Degenerative intervertebral disc space narrowing. Right paracentral disc osteophyte complex indents the right ventral thecal sac (series 8, image 22). Mild cord flattening without cord signal changes. No significant spinal stenosis. Superimposed uncovertebral spurring without significant foraminal  encroachment. C5-C6: Degenerative intervertebral disc space narrowing. Diffuse disc bulge with bilateral uncovertebral spurring. No significant spinal stenosis. Foramina remain adequately patent. C6-C7: Diffuse disc bulge with bilateral uncovertebral spurring. No spinal stenosis. Foramina remain adequately patent. C7-T1: Small left foraminal disc osteophyte complex (series 5, image 11). Mild bilateral facet hypertrophy. No spinal stenosis. Mild left C8 foraminal narrowing. Right neural foramina remains patent. IMPRESSION: 1. No evidence for metastatic disease within the cervical spine. 2. Small left foraminal disc osteophyte complex at C7-T1, potentially irritating the left C8 nerve root. 3. Right paracentral disc osteophyte complex at C4-5 with resultant mild cord flattening, but no cord signal changes or significant stenosis. 4. Additional mild noncompressive disc bulging at C3-4 through C6-7 without significant stenosis or impingement. 5. Chronic pontine lacunar infarct. Electronically Signed   By: Morene Hoard M.D.   On: 02/13/2024 21:27   ECHOCARDIOGRAM COMPLETE Result Date: 02/13/2024    ECHOCARDIOGRAM REPORT   Patient Name:   NARYA BEAVIN Date of Exam: 02/13/2024 Medical Rec #:  [id number]         Height:       67.5 in Accession #:    [id number]        Weight:       238.0 lb Date of Birth:  April 27, 1960        BSA:          2.189  m Patient Age:    62 years          BP:           138/62 mmHg Patient Gender: F                 HR:           58 bpm. Exam Location:  Inpatient Procedure: 2D Echo, Cardiac Doppler and Color Doppler (Both Spectral and Color            Flow Doppler were utilized during procedure). Indications:    Stroke  History:        Patient has prior history of Echocardiogram examinations, most                 recent 11/04/2021. Risk Factors:Hypertension.  Sonographer:    Carmelita Hartshorn RDCS, FE, PE Referring Phys: [id number] VASUNDHRA RATHORE IMPRESSIONS  1. Left ventricular ejection  fraction, by estimation, is 65 to 70%. The left ventricle has normal function. The left ventricle has no regional wall motion abnormalities. There is mild concentric left ventricular hypertrophy. Left ventricular diastolic parameters are consistent with Grade I diastolic dysfunction (impaired relaxation).  2. Right ventricular systolic function is normal. The right ventricular size is normal.  3. The mitral valve is degenerative. Trivial mitral valve regurgitation. No evidence of mitral stenosis.  4. The aortic valve is tricuspid. Aortic valve regurgitation is not visualized. Aortic valve sclerosis/calcification is present, without any evidence of aortic stenosis.  5. The inferior vena cava is normal in size with greater than 50% respiratory variability, suggesting right atrial pressure of 3 mmHg. Conclusion(s)/Recommendation(s): No intracardiac source of embolism detected on this transthoracic study. Consider a transesophageal echocardiogram to exclude cardiac source of embolism if clinically indicated. FINDINGS  Left Ventricle: Left ventricular ejection fraction, by estimation, is 65 to 70%. The left ventricle has normal function. The left ventricle has no regional wall motion abnormalities. The left ventricular internal cavity size was normal in size. There is  mild concentric left ventricular hypertrophy. Left ventricular diastolic parameters are consistent with Grade I diastolic dysfunction (impaired relaxation). Normal left ventricular filling pressure. Right Ventricle: The right ventricular size is normal. No increase in right ventricular wall thickness. Right ventricular systolic function is normal. Left Atrium: Left atrial size was normal in size. Right Atrium: Right atrial size was normal in size. Pericardium: There is no evidence of pericardial effusion. Mitral Valve: The mitral valve is degenerative in appearance. There is moderate thickening of the mitral valve leaflet(s). There is mild calcification of  the mitral valve leaflet(s). Mild mitral annular calcification. Trivial mitral valve regurgitation. No evidence of mitral valve stenosis. Tricuspid Valve: The tricuspid valve is normal in structure. Tricuspid valve regurgitation is trivial. No evidence of tricuspid stenosis. Aortic Valve: The aortic valve is tricuspid. Aortic valve regurgitation is not visualized. Aortic valve sclerosis/calcification is present, without any evidence of aortic stenosis. Pulmonic Valve: The pulmonic valve was normal in structure. Pulmonic valve regurgitation is trivial. No evidence of pulmonic stenosis. Aorta: The aortic root is normal in size and structure. Venous: The inferior vena cava is normal in size with greater than 50% respiratory variability, suggesting right atrial pressure of 3 mmHg. IAS/Shunts: No atrial level shunt detected by color flow Doppler.  LEFT VENTRICLE PLAX 2D LVIDd:         4.40 cm   Diastology LVIDs:         2.90 cm   LV e' medial:    6.42 cm/s  LV PW:         1.10 cm   LV E/e' medial:  10.4 LV IVS:        1.20 cm   LV e' lateral:   5.33 cm/s LVOT diam:     2.12 cm   LV E/e' lateral: 12.5 LV SV:         97 LV SV Index:   44 LVOT Area:     3.53 cm  RIGHT VENTRICLE RV S prime:     13.80 cm/s LEFT ATRIUM             Index        RIGHT ATRIUM           Index LA diam:        3.57 cm 1.63 cm/m   RA Area:     12.00 cm LA Vol (A2C):   63.7 ml 29.09 ml/m  RA Volume:   25.20 ml  11.51 ml/m LA Vol (A4C):   80.0 ml 36.54 ml/m LA Biplane Vol: 72.4 ml 33.07 ml/m  AORTIC VALVE LVOT Vmax:   123.00 cm/s LVOT Vmean:  79.700 cm/s LVOT VTI:    0.276 m  AORTA Ao Root diam: 2.78 cm Ao Asc diam:  2.37 cm MITRAL VALVE               TRICUSPID VALVE MV Area (PHT): 2.66 cm    TR Peak grad:   40.2 mmHg MV Decel Time: 285 msec    TR Vmax:        317.00 cm/s MV E velocity: 66.60 cm/s MV A velocity: 98.00 cm/s  SHUNTS MV E/A ratio:  0.68        Systemic VTI:  0.28 m                            Systemic Diam: 2.12 cm Wilbert Bihari MD  Electronically signed by Wilbert Bihari MD Signature Date/Time: 02/13/2024/9:19:02 AM    Final    CT ANGIO HEAD NECK W WO CM Result Date: 02/13/2024 EXAM: CTA HEAD AND NECK WITHOUT AND WITH 02/13/2024 03:23:38 AM TECHNIQUE: CTA of the head and neck was performed without and with the administration of intravenous contrast. Multiplanar 2D and/or 3D reformatted images are provided for review. Automated exposure control, iterative reconstruction, and/or weight based adjustment of the mA/kV was utilized to reduce the radiation dose to as low as reasonably achievable. Stenosis of the internal carotid arteries measured using NASCET criteria. COMPARISON: None available CLINICAL HISTORY: Neuro deficit, acute, stroke suspected FINDINGS: AORTIC ARCH AND ARCH VESSELS: No dissection or arterial injury. No significant stenosis of the brachiocephalic or subclavian arteries. CERVICAL CAROTID ARTERIES: Approximately 50% stenosis of the right internal carotid artery at the skull base. Approximately 70% stenosis of the right internal carotid artery at the skull base. CERVICAL VERTEBRAL ARTERIES: Patent vertebral arteries. Moderate right proximal V2 verebtral artery stenosis. 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 LUNGS AND MEDIASTINUM: Unremarkable. SOFT TISSUES: No acute abnormality. BONES: No acute abnormality. ANTERIOR CIRCULATION: Severe bilateral cavernous ICA stenosis. No significant stenosis of the anterior cerebral arteries. No significant stenosis of the middle cerebral arteries. No aneurysm. POSTERIOR CIRCULATION: No significant stenosis of the posterior cerebral arteries. No significant stenosis of the basilar artery. No significant stenosis of the vertebral arteries. No aneurysm. OTHER: No dural venous sinus thrombosis on this non-dedicated study. IMPRESSION: 1. No large vessel occlusion. 2. Severe bilateral cavernous ICA stenosis.  3. Stenoses of the ICAs at the skull base, 70% on the left and 50% on the  right. 4. Moderate right proximal V2 verebtral artery stenosis. 5. 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 Electronically signed by: Gilmore Molt MD 02/13/2024 03:51 AM EST RP Workstation: [id number]   MR BRAIN W WO CONTRAST Result Date: 02/13/2024 EXAM: MRI Brain With and Without Contrast 02/13/2024 01:18:47 AM TECHNIQUE: Multiplanar multisequence MRI of the head/brain was performed with and without the administration of intravenous contrast. COMPARISON: CT head 12/13/2023 CLINICAL HISTORY: Metastatic disease evaluation; Neuro deficit, acute, stroke suspected FINDINGS: BRAIN AND VENTRICLES: No acute infarct. No acute intracranial hemorrhage. No mass effect or midline shift. No hydrocephalus. Normal flow voids. No mass or abnormal enhancement. Small remote right thalamic lacunar infarct and mild chronic microvascular ischemic change. ORBITS: No acute abnormality. SINUSES: No acute abnormality. BONES AND SOFT TISSUES: Normal bone marrow signal and enhancement. No acute soft tissue abnormality. IMPRESSION: 1. No evidence of acute intracranial abnormality or metastatic disease. Electronically signed by: Gilmore Molt MD 02/13/2024 02:09 AM EST RP Workstation: [id number]   DG CHEST PORT 1 VIEW Result Date: 02/12/2024 EXAM: 1 VIEW(S) XRAY OF THE CHEST 02/12/2024 11:44:38 PM COMPARISON: Chest CT 01/26/2024 CLINICAL HISTORY: Chest pain. FINDINGS: LINES, TUBES AND DEVICES: Right chest port catheter tip is over the SVC. LUNGS AND PLEURA: No focal pulmonary opacity. No pulmonary edema. No pleural effusion. No pneumothorax. HEART AND MEDIASTINUM: Cardiac silhouette appears borderline enlarged, a new finding. BONES AND SOFT TISSUES: No acute osseous abnormality. IMPRESSION: 1. No acute findings. 2. New borderline enlarged cardiac silhouette; correlate clinically. Electronically signed by: Greig Pique MD 02/12/2024 11:58 PM EST RP Workstation: [id number]   CT HEAD CODE STROKE WO  CONTRAST (LKW 0-4.5h, LVO 0-24h) Result Date: 02/12/2024 EXAM: CT HEAD WITHOUT CONTRAST 02/12/2024 08:57:06 PM TECHNIQUE: CT of the head was performed without the administration of intravenous contrast. Automated exposure control, iterative reconstruction, and/or weight based adjustment of the mA/kV was utilized to reduce the radiation dose to as low as reasonably achievable. COMPARISON: None available. CLINICAL HISTORY: Neuro deficit, acute, stroke suspected. FINDINGS: BRAIN AND VENTRICLES: No acute hemorrhage. No evidence of acute infarct. No hydrocephalus. No extra-axial collection. No mass effect or midline shift. ORBITS: No acute abnormality. SINUSES: No acute abnormality. SOFT TISSUES AND SKULL: No acute soft tissue abnormality. No skull fracture. Findings conveyed to Dr. Vanessa via pager at 9:05 PM IMPRESSION: 1. No acute intracranial abnormality. Electronically signed by: Gilmore Molt MD 02/12/2024 09:06 PM EST RP Workstation: [id number]   CT CHEST ABDOMEN PELVIS W CONTRAST Result Date: 01/28/2024 CLINICAL DATA:  Metastatic colon cancer, assess treatment response * Tracking Code: BO * EXAM: CT CHEST, ABDOMEN, AND PELVIS WITH CONTRAST TECHNIQUE: Multidetector CT imaging of the chest, abdomen and pelvis was performed following the standard protocol during bolus administration of intravenous contrast. RADIATION DOSE REDUCTION: This exam was performed according to the departmental dose-optimization program which includes automated exposure control, adjustment of the mA and/or kV according to patient size and/or use of iterative reconstruction technique. CONTRAST:  OMNIPAQUE  IOHEXOL  300 MG/ML  SOLN COMPARISON:  09/29/2023 FINDINGS: CT CHEST FINDINGS Cardiovascular: Right chest port catheter. Aortic atherosclerosis. Normal heart size. Three-vessel coronary artery calcifications. No pericardial effusion. Mediastinum/Nodes: Unchanged enlarged pretracheal lymph nodes measuring up to 1.6 x 1.1 cm  (series 2, image 24). Thyroid  gland, trachea, and esophagus demonstrate no significant findings. Lungs/Pleura: Mild centrilobular emphysema. Diffuse bilateral bronchial wall thickening. No pleural effusion or pneumothorax. Musculoskeletal: No chest  wall abnormality. No acute osseous findings. CT ABDOMEN PELVIS FINDINGS Hepatobiliary: Interval enlargement of noncalcified hypodense metastasis in the central left lobe of the liver, hepatic segment IV, measuring 2.7 x 2.3 cm, previously 2.3 x 2.0 cm (series 2, image 62). Multiple additional calcified and noncalcified metastases throughout the liver are unchanged. Cholecystectomy. Unchanged mild postoperative biliary ductal dilatation. Pancreas: Unremarkable. No pancreatic ductal dilatation or surrounding inflammatory changes. Spleen: Normal in size without significant abnormality. Adrenals/Urinary Tract: Adrenal glands are unremarkable. Kidneys are normal, without renal calculi, solid lesion, or hydronephrosis. Bladder is unremarkable. Stomach/Bowel: Stomach is within normal limits. Appendix appears normal. No evidence of bowel wall thickening, distention, or inflammatory changes. Vascular/Lymphatic: Aortic atherosclerosis. No enlarged abdominal or pelvic lymph nodes. Reproductive: No mass or other abnormality. 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 Small fat containing bilateral inguinal hernias. No ascites. Musculoskeletal: No acute osseous findings. IMPRESSION: 1. Interval enlargement of noncalcified hypodense metastasis in the central left lobe of the liver, hepatic segment IV. Multiple additional calcified and noncalcified metastases throughout the liver are unchanged. 2. Unchanged enlarged pretracheal lymph nodes. 3. 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 4. Emphysema and diffuse bilateral bronchial wall thickening. 5. Coronary artery disease. Aortic Atherosclerosis (ICD10-I70.0) and Emphysema (ICD10-J43.9).  Electronically Signed   By: Marolyn JONETTA Jaksch M.D.   On: 01/28/2024 23:09     Subjective: Patient seen examined bedside, walking around room.  Feels well, denies any further recurrence of rectal bleeding greater than 48 hours.  Wanting to discharge home.  Hemoglobin stable.  Discussed discontinuation of Plavix , holding aspirin  to follow-up with PCP.  No other questions or concerns at this time.  Denies headache, no dizziness, no visual changes, no chest pain, no palpitations, no shortness of breath, no abdominal pain, no fever/chills/night sweats, no nausea/vomiting, no focal weakness, no fatigue, no paresthesias.  No acute events overnight per nursing staff.  Discharge Exam: Vitals:   02/24/24 0514 02/24/24 0643  BP: (!) 121/57 (!) 121/57  Pulse: 63   Resp: 17   Temp: 98.5 F (36.9 C)   SpO2:     Vitals:   02/23/24 2223 02/24/24 0249 02/24/24 0514 02/24/24 0643  BP: (!) 132/55  (!) 121/57 (!) 121/57  Pulse:   63   Resp:   17   Temp:   98.5 F (36.9 C)   TempSrc:      SpO2:      Height:  5' 7.5 (1.715 m)      Physical Exam: GEN: NAD, alert and oriented x 3, wd/wn HEENT: NCAT, PERRL, EOMI, sclera clear, MMM PULM: CTAB w/o wheezes/crackles, normal respiratory effort, on room air CV: RRR w/o M/G/R GI: abd soft, NTND, + BS; ostomy noted with brown liquid stool in collection bag MSK: no peripheral edema, moves all extremities with preserved muscle strength NEURO: No focal neurological deficit PSYCH: normal mood/affect Integumentary: dry/intact, no rashes or wounds    The results of significant diagnostics from this hospitalization (including imaging, microbiology, ancillary and laboratory) are listed below for reference.     Microbiology: No results found for this or any previous visit (from the past 240 hours).   Labs: BNP (last 3 results) No results for input(s): BNP in the last 8760 hours. Basic Metabolic Panel: Recent Labs  Lab 02/22/24 1325 02/24/24 0604  NA 136  137  K 5.1 4.6  CL 104 103  CO2 25 27  GLUCOSE 87 86  BUN 23 15  CREATININE 0.91 0.87  CALCIUM  9.8 9.8  Liver Function Tests: Recent Labs  Lab 02/22/24 1325  AST 21  ALT 21  ALKPHOS 109  BILITOT 0.5  PROT 7.1  ALBUMIN 3.7   No results for input(s): LIPASE, AMYLASE in the last 168 hours. No results for input(s): AMMONIA in the last 168 hours. CBC: Recent Labs  Lab 02/22/24 1325 02/22/24 2214 02/23/24 0500 02/23/24 1952 02/24/24 0604  WBC 2.6*  --  2.6*  --  3.1*  HGB 10.6* 10.7* 10.0* 10.4* 9.8*  HCT 33.6* 33.8* 32.3* 33.2* 31.8*  MCV 96.0  --  95.8  --  96.1  PLT 160  --  144*  --  136*   Cardiac Enzymes: No results for input(s): CKTOTAL, CKMB, CKMBINDEX, TROPONINI in the last 168 hours. BNP: Invalid input(s): POCBNP CBG: No results for input(s): GLUCAP in the last 168 hours. D-Dimer No results for input(s): DDIMER in the last 72 hours. Hgb A1c No results for input(s): HGBA1C in the last 72 hours. Lipid Profile No results for input(s): CHOL, HDL, LDLCALC, TRIG, CHOLHDL, LDLDIRECT in the last 72 hours. Thyroid  function studies No results for input(s): TSH, T4TOTAL, T3FREE, THYROIDAB in the last 72 hours.  Invalid input(s): FREET3 Anemia work up No results for input(s): VITAMINB12, FOLATE, FERRITIN, TIBC, IRON, RETICCTPCT in the last 72 hours. Urinalysis    Component Value Date/Time   COLORURINE YELLOW 12/01/2020 1917   APPEARANCEUR CLEAR 12/01/2020 1917   LABSPEC 1.011 12/01/2020 1917   PHURINE 6.0 12/01/2020 1917   GLUCOSEU NEGATIVE 12/01/2020 1917   HGBUR NEGATIVE 12/01/2020 1917   BILIRUBINUR NEGATIVE 12/01/2020 1917   KETONESUR NEGATIVE 12/01/2020 1917   PROTEINUR 30 (A) 02/16/2024 1102   NITRITE NEGATIVE 12/01/2020 1917   LEUKOCYTESUR SMALL (A) 12/01/2020 1917   Sepsis Labs Recent Labs  Lab 02/22/24 1325 02/23/24 0500 02/24/24 0604  WBC 2.6* 2.6* 3.1*   Microbiology No results  found for this or any previous visit (from the past 240 hours).   Time coordinating discharge: Over 30 minutes  SIGNED:   Camellia PARAS Marit Goodwill, DO  [facility] 02/24/2024, 9:41 AM

## 2024-02-24 NOTE — Discharge Instructions (Signed)
 Discontinue Plavix .  Continue to hold aspirin  for 1 week until follow-up with PCP for repeat labs.

## 2024-02-24 NOTE — TOC Initial Note (Signed)
 Transition of Care [facility]) - Initial/Assessment Note    Patient Details  Name: Casey Black MRN: [id number] Date of Birth: 03-03-1961  [facility] CM/SW Contact:    Sonda Manuella Quill, RN Phone Number: 02/24/2024, 10:23 AM  Clinical Narrative:                 Spoke w/ pt in room; pt said she lives at home w/ her dtr Casey Black [phone] she plans to return at d/c; her dtr will provide transportation; insurance/PCP verified; pt denied experiencing SDOH risks; she does not have DME, HH services, or home oxygen; no IP CM needs.  Expected Discharge Plan: Home/Self Care Barriers to Discharge: No Barriers Identified   Patient Goals and CMS Choice Patient states their goals for this hospitalization and ongoing recovery are:: home     [place] ownership interest in [facility].provided to:: Patient    Expected Discharge Plan and Services   Discharge Planning Services: CM Consult Post Acute Care Choice: NA Living arrangements for the past 2 months: Apartment Expected Discharge Date: 02/24/24               DME Arranged: N/A DME Agency: NA       HH Arranged: NA HH Agency: NA        Prior Living Arrangements/Services Living arrangements for the past 2 months: Apartment Lives with:: Adult Children Patient language and need for interpreter reviewed:: Yes Do you feel safe going back to the place where you live?: Yes      Need for Family Participation in Patient Care: Yes (Comment) Care giver support system in place?: Yes (comment) Current home services:  (n/a) Criminal Activity/Legal Involvement Pertinent to Current Situation/Hospitalization: No - Comment as needed  Activities of Daily Living   ADL Screening (condition at time of admission) Independently performs ADLs?: Yes (appropriate for developmental age) Is the patient deaf or have difficulty hearing?: No Does the patient have difficulty seeing, even when wearing  glasses/contacts?: No Does the patient have difficulty concentrating, remembering, or making decisions?: No  Permission Sought/Granted Permission sought to share information with : Case Manager Permission granted to share information with : Yes, Release of Information Signed  Share Information with NAME: Case Manager     Permission granted to share info w Relationship: Syerra Abdelrahman (dtr) [phone]     Emotional Assessment Appearance:: Appears stated age Attitude/Demeanor/Rapport: Gracious Affect (typically observed): Accepting Orientation: : Oriented to Self, Oriented to Place, Oriented to  Time, Oriented to Situation Alcohol / Substance Use: Not Applicable Psych Involvement: No (comment)  Admission diagnosis:  GI bleed [K92.2] Lower GI bleed [K92.2] Patient Active Problem List   Diagnosis Date Noted   Lower GI bleed 02/22/2024   GI bleed 02/22/2024   Numbness and tingling of left arm and leg 02/13/2024   Left sided numbness 02/12/2024   Chest pain 02/12/2024   Hypothyroidism 02/03/2023   CHB (complete heart block) (HCC) 11/03/2021   Pain due to onychomycosis of toenails of both feet 10/21/2021   Vitamin D  deficiency 05/12/2021   Onychomycosis due to dermatophyte 06/11/2020   Chemotherapy-induced peripheral neuropathy 09/24/2019   Trichomonas vaginalis infection 07/24/2019   ASCUS of cervix with negative high risk HPV 07/24/2019   Port-A-Cath in place 03/12/2019   Goals of care, counseling/discussion 02/15/2019   IDA (iron deficiency anemia) 02/02/2019   Adenocarcinoma of colon metastatic to liver (HCC) 02/02/2019   SBO (small bowel obstruction) (HCC) 01/23/2019   Hypertension 01/23/2019   Tobacco  dependence 01/23/2019   PCP:  [name], [name], MD Pharmacy:   [facility] [phone] - [place], [place] - [address] AT [facility] [address] [place] [address] Phone: [phone] Fax: [phone]     Social  Drivers of Health (SDOH) Social History: SDOH Screenings   Food Insecurity: No Food Insecurity (02/24/2024)  Housing: Low Risk  (02/24/2024)  Transportation Needs: No Transportation Needs (02/24/2024)  Utilities: Not At Risk (02/24/2024)  Alcohol Screen: Low Risk  (02/01/2023)  Depression (PHQ2-9): Low Risk  (02/04/2024)  Financial Resource Strain: Low Risk  (02/01/2023)  Physical Activity: Insufficiently Active (02/01/2023)  Social Connections: Moderately Isolated (02/01/2023)  Stress: No Stress Concern Present (02/01/2023)  Tobacco Use: Medium Risk (01/23/2024)  Health Literacy: Adequate Health Literacy (02/01/2023)   SDOH Interventions: Food Insecurity Interventions: Intervention Not Indicated, Inpatient TOC Housing Interventions: Intervention Not Indicated, Inpatient TOC Transportation Interventions: Intervention Not Indicated, Inpatient TOC Utilities Interventions: Intervention Not Indicated, Inpatient TOC   Readmission Risk Interventions    02/24/2024   10:14 AM  Readmission Risk Prevention Plan  Transportation Screening Complete  PCP or Specialist Appt within 5-7 Days Complete  Home Care Screening Complete  Medication Review (RN CM) Complete

## 2024-02-24 NOTE — Plan of Care (Signed)

## 2024-02-24 NOTE — Progress Notes (Signed)
 Chief Complaint  Patient presents with   Medicare Wellness     Subjective:   Casey Black is a 63 y.o. female who presents for a Medicare Annual Wellness Visit.  Allergies (verified) Patient has no known allergies.   History: Past Medical History:  Diagnosis Date   Anemia    low iron   Colon cancer (HCC) 01/2019   Hypertension 01/23/2019   Personal history of chemotherapy 01/2019   colon CA   SBO (small bowel obstruction) (HCC) 01/23/2019   Past Surgical History:  Procedure Laterality Date   CESAREAN SECTION     x2   COLOSTOMY N/A 01/24/2019   Procedure: End Loop Colostomy;  Surgeon: [name], MD;  Location: [facility] OR;  Service: General;  Laterality: N/A;   PARTIAL COLECTOMY N/A 01/24/2019   Procedure: PARTIAL COLECTOMY;  Surgeon: [name], MD;  Location: [facility] OR;  Service: General;  Laterality: N/A;   PORTACATH PLACEMENT Right 02/28/2019   Procedure: INSERTION PORT-A-CATH WITH ULTRASOUND GUIDANCE;  Surgeon: [name], MD;  Location: [facility] OR;  Service: General;  Laterality: Right;   Family History  Problem Relation Age of Onset   Heart murmur Mother    Kidney failure Father    Hypertension Father    Hypertension Sister    Social History   Occupational History   Not on file  Tobacco Use   Smoking status: Former    Current packs/day: 0.00    Average packs/day: 0.5 packs/day for 29.0 years (14.5 ttl pk-yrs)    Types: Cigarettes    Start date: 01/03/1990    Quit date: 01/04/2019    Years since quitting: 5.1   Smokeless tobacco: Never   Tobacco comments:    desires patch  Vaping Use   Vaping status: Never Used  Substance and Sexual Activity   Alcohol use: Not Currently    Comment: a pint a week   Drug use: Never   Sexual activity: Not on file   Tobacco Counseling Counseling given: No Tobacco comments: desires patch  SDOH Screenings   Food Insecurity: No Food Insecurity (02/24/2024)  Housing: Unknown (02/24/2024)   Transportation Needs: No Transportation Needs (02/24/2024)  Utilities: Not At Risk (02/24/2024)  Alcohol Screen: Low Risk  (02/01/2023)  Depression (PHQ2-9): Low Risk  (02/24/2024)  Financial Resource Strain: Low Risk  (02/01/2023)  Physical Activity: Insufficiently Active (02/24/2024)  Social Connections: Moderately Integrated (02/24/2024)  Stress: No Stress Concern Present (02/24/2024)  Tobacco Use: Medium Risk (02/24/2024)  Health Literacy: Adequate Health Literacy (02/24/2024)   See flowsheets for full screening details  Depression Screen PHQ 2 & 9 Depression Scale- Over the past 2 weeks, how often have you been bothered by any of the following problems? Little interest or pleasure in doing things: 0 Feeling down, depressed, or hopeless (PHQ Adolescent also includes...irritable): 0 PHQ-2 Total Score: 0     Goals Addressed               This Visit's Progress     Increase physical activity (pt-stated)        Get more active.       Visit info / Clinical Intake: Medicare Wellness Visit Type:: Subsequent Annual Wellness Visit Persons participating in visit:: patient Medicare Wellness Visit Mode:: In-person (required for WTM) Interpreter Needed?: No Pre-visit prep was completed: no AWV questionnaire completed by patient prior to visit?: no Living arrangements:: with family/others Patient's Overall Health Status Rating: very good Typical amount of pain: none Does pain affect daily  life?: no Are you currently prescribed opioids?: no  Dietary Habits and Nutritional Risks How many meals a day?: 3 Eats fruit and vegetables daily?: yes Most meals are obtained by: preparing own meals In the last 2 weeks, have you had any of the following?: none Diabetic:: no  Functional Status Activities of Daily Living (to include ambulation/medication): Independent Ambulation: Independent with device- listed below Home Assistive Devices/Equipment: Eyeglasses; Dentures (specify  type) Medication Administration: Independent Home Management: Independent Manage your own finances?: yes Primary transportation is: driving Concerns about vision?: no *vision screening is required for WTM* Concerns about hearing?: no  Fall Screening Falls in the past year?: 0 Number of falls in past year: 0 Was there an injury with Fall?: 0 Fall Risk Category Calculator: 0 Patient Fall Risk Level: Low Fall Risk  Fall Risk Patient at Risk for Falls Due to: No Fall Risks  Home and Transportation Safety: All rugs have non-skid backing?: N/A, no rugs All stairs or steps have railings?: N/A, no stairs Grab bars in the bathtub or shower?: (!) no Have non-skid surface in bathtub or shower?: yes Good home lighting?: yes Regular seat belt use?: yes Hospital stays in the last year:: (!) yes How many hospital stays:: 3 Reason: GI Bleed  Cognitive Assessment Difficulty concentrating, remembering, or making decisions? : no Will 6CIT or Mini Cog be Completed: no 6CIT or Mini Cog Declined: patient alert, oriented, able to answer questions appropriately and recall recent events  Advance Directives (For Healthcare) Does Patient Have a Medical Advance Directive?: No Would patient like information on creating a medical advance directive?: No - Patient declined  Reviewed/Updated  Reviewed/Updated: Reviewed All (Medical, Surgical, Family, Medications, Allergies, Care Teams, Patient Goals)        Objective:    Today's Vitals   02/24/24 1444  BP: (!) 140/74  Pulse: 90  Temp: 98.8 F (37.1 C)  TempSrc: Oral  SpO2: 98%  Weight: 232 lb 3.2 oz (105.3 kg)  Height: 5' 7.5 (1.715 m)   Body mass index is 35.83 kg/m.  Current Medications (verified) Outpatient Encounter Medications as of 02/24/2024  Medication Sig   acetaminophen  (TYLENOL ) 325 MG tablet Take 2 tablets (650 mg total) by mouth every 6 (six) hours as needed. (Patient taking differently: Take 650 mg by mouth every 6 (six)  hours as needed for mild pain (pain score 1-3) (or headaches).)   amLODipine  (NORVASC ) 10 MG tablet TAKE 1 TABLET BY MOUTH EVERY DAY   [Paused] aspirin  EC 81 MG tablet Take 1 tablet (81 mg total) by mouth daily. Swallow whole. (Patient taking differently: Take 81 mg by mouth every evening. Swallow whole.)   dexamethasone  (DECADRON ) 4 MG tablet Take 2 tablets (8 mg total) by mouth daily. Start the day after chemotherapy for 2 days. Take with food.   ferrous sulfate  325 (65 FE) MG tablet TAKE 1 TABLET BY MOUTH TWICE A DAY WITH FOOD   gabapentin  (NEURONTIN ) 300 MG capsule TAKE 1 CAPSULE BY MOUTH TWICE A DAY (Patient taking differently: Take 300 mg by mouth in the morning and at bedtime.)   hydrALAZINE  (APRESOLINE ) 25 MG tablet TAKE 1 TABLET BY MOUTH EVERY 8 HOURS (Patient taking differently: Take 25 mg by mouth in the morning and at bedtime.)   hydrochlorothiazide  (HYDRODIURIL ) 12.5 MG tablet TAKE 1 TABLET BY MOUTH EVERY DAY (Patient taking differently: Take 12.5 mg by mouth at bedtime.)   isosorbide  mononitrate (IMDUR ) 30 MG 24 hr tablet TAKE 1 TABLET BY MOUTH EVERY DAY  levothyroxine  (SYNTHROID ) 25 MCG tablet Take 25 mcg by mouth daily before breakfast.   lidocaine -prilocaine  (EMLA ) cream APPLY 1 APPLICATION TOPICALLY AS NEEDED. (Patient taking differently: Apply 1 Application topically as needed (for port access).)   loperamide  (IMODIUM ) 2 MG capsule Take 2 tabs by mouth with first loose stool, then 1 tab with each additional loose stool as needed. Do not exceed 8 tabs in a 24-hour period (Patient taking differently: Take 4 mg by mouth See admin instructions. Take 4 mg (2 tablets) by mouth with first loose stool, then 2 mg (1 tablet) with each additional loose stool as needed. Do not exceed 16 mg (8 tablets) in a 24-hour period.)   ondansetron  (ZOFRAN ) 8 MG tablet Take 1 tablet (8 mg total) by mouth every 8 (eight) hours as needed for nausea, vomiting or refractory nausea / vomiting. Start on the third  day after chemotherapy.   prochlorperazine  (COMPAZINE ) 10 MG tablet Take 1 tablet (10 mg total) by mouth every 6 (six) hours as needed for nausea or vomiting.   Vitamin D , Ergocalciferol , (DRISDOL ) 1.25 MG (50000 UNIT) CAPS capsule Take 1 capsule (50,000 Units total) by mouth every 7 (seven) days for 12 doses. (Patient taking differently: Take 50,000 Units by mouth every Friday.)   Facility-Administered Encounter Medications as of 02/24/2024  Medication   sodium chloride  flush (NS) 0.9 % injection 10 mL   [DISCONTINUED] acetaminophen  (TYLENOL ) suppository 650 mg   [DISCONTINUED] acetaminophen  (TYLENOL ) tablet 650 mg   [DISCONTINUED] Chlorhexidine  Gluconate Cloth 2 % PADS 6 each   [DISCONTINUED] gabapentin  (NEURONTIN ) capsule 300 mg   [DISCONTINUED] hydrALAZINE  (APRESOLINE ) injection 5 mg   [DISCONTINUED] hydrALAZINE  (APRESOLINE ) tablet 25 mg   [DISCONTINUED] levothyroxine  (SYNTHROID ) tablet 25 mcg   [DISCONTINUED] ondansetron  (ZOFRAN ) injection 4 mg   [DISCONTINUED] ondansetron  (ZOFRAN ) tablet 4 mg   [DISCONTINUED] sodium chloride  flush (NS) 0.9 % injection 10-40 mL   [DISCONTINUED] sodium chloride  flush (NS) 0.9 % injection 10-40 mL   Hearing/Vision screen Hearing Screening - Comments:: Denies hearing difficulties   Vision Screening - Comments:: Wears rx glasses - up to date with routine eye exams with  [facility] Immunizations and Health Maintenance Health Maintenance  Topic Date Due   Pneumococcal Vaccine: 50+ Years (1 of 2 - PCV) Never done   COVID-19 Vaccine (3 - Pfizer risk series) 12/06/2019   Mammogram  11/07/2024   Cervical Cancer Screening (HPV/Pap Cotest)  01/28/2025   Medicare Annual Wellness (AWV)  02/23/2025   DTaP/Tdap/Td (2 - Td or Tdap) 05/08/2031   Influenza Vaccine  Completed   Hepatitis C Screening  Completed   HIV Screening  Completed   Hepatitis B Vaccines 19-59 Average Risk  Aged Out   HPV VACCINES  Aged Out   Meningococcal B Vaccine  Aged Out    Colonoscopy  Discontinued   Zoster Vaccines- Shingrix  Discontinued        Assessment/Plan:  This is a routine wellness examination for [place].  Patient Care Team: [name], [name], MD as PCP - General (Internal Medicine) [name], MD as PCP - Cardiology (Cardiology) [name], MD as Consulting Physician (General Surgery) [name], MD as Consulting Physician (Hematology) [name], [name], NP as Nurse Practitioner (Nurse Practitioner)  I have personally reviewed and noted the following in the patient's chart:   Medical and social history Use of alcohol, tobacco or illicit drugs  Current medications and supplements including opioid prescriptions. Functional ability and status Nutritional status Physical activity Advanced directives List  of other physicians Hospitalizations, surgeries, and ER visits in previous 12 months Vitals Screenings to include cognitive, depression, and falls Referrals and appointments  No orders of the defined types were placed in this encounter.  In addition, I have reviewed and discussed with patient certain preventive protocols, quality metrics, and best practice recommendations. A written personalized care plan for preventive services as well as general preventive health recommendations were provided to patient.   Rojelio LELON Blush, LPN   [id number]   Return in 1 year on 03/01/25  After Visit Summary: (In Person-Printed) AVS printed and given to the patient  Nurse Notes: None

## 2024-02-26 ENCOUNTER — Other Ambulatory Visit: Payer: Self-pay

## 2024-02-26 DIAGNOSIS — K56609 Unspecified intestinal obstruction, unspecified as to partial versus complete obstruction: Secondary | ICD-10-CM | POA: Diagnosis not present

## 2024-02-26 DIAGNOSIS — T8131XD Disruption of external operation (surgical) wound, not elsewhere classified, subsequent encounter: Secondary | ICD-10-CM | POA: Diagnosis not present

## 2024-02-26 DIAGNOSIS — S31109A Unspecified open wound of abdominal wall, unspecified quadrant without penetration into peritoneal cavity, initial encounter: Secondary | ICD-10-CM | POA: Diagnosis not present

## 2024-02-27 ENCOUNTER — Telehealth: Payer: Self-pay

## 2024-02-27 DIAGNOSIS — C787 Secondary malignant neoplasm of liver and intrahepatic bile duct: Secondary | ICD-10-CM | POA: Diagnosis not present

## 2024-02-27 DIAGNOSIS — C189 Malignant neoplasm of colon, unspecified: Secondary | ICD-10-CM | POA: Diagnosis not present

## 2024-02-27 NOTE — Transitions of Care (Post Inpatient/ED Visit) (Signed)
   02/27/2024  Name: Casey Black MRN: [id number] DOB: 1960/10/16  Today's TOC FU Call Status: Today's TOC FU Call Status:: Unsuccessful Call (1st Attempt) Unsuccessful Call (1st Attempt) Date: 02/27/24  Attempted to reach the patient regarding the most recent Inpatient/ED visit.  Follow Up Plan: Additional outreach attempts will be made to reach the patient to complete the Transitions of Care (Post Inpatient/ED visit) call.   Alan Ee, RN, BSN, CEN [employer]- Transition of Care Team.  [facility] [phone]

## 2024-02-28 ENCOUNTER — Telehealth: Payer: Self-pay

## 2024-02-28 ENCOUNTER — Encounter: Payer: Self-pay | Admitting: Hematology

## 2024-02-28 LAB — GUARDANT 360

## 2024-02-28 NOTE — Transitions of Care (Post Inpatient/ED Visit) (Signed)
   02/28/2024  Name: Casey Black MRN: [id number] DOB: 07-Jan-1961  Today's TOC FU Call Status: Today's TOC FU Call Status:: Unsuccessful Call (2nd Attempt) Unsuccessful Call (2nd Attempt) Date: 02/28/24  Attempted to reach the patient regarding the most recent Inpatient/ED visit.  Follow Up Plan: Additional outreach attempts will be made to reach the patient to complete the Transitions of Care (Post Inpatient/ED visit) call.   Alan Ee, RN, BSN, CEN [employer]- Transition of Care Team.  [facility] [phone]

## 2024-02-29 ENCOUNTER — Telehealth: Payer: Self-pay

## 2024-02-29 NOTE — Transitions of Care (Post Inpatient/ED Visit) (Signed)
   02/29/2024  Name: Casey Black MRN: [id number] DOB: 06/23/1960  Today's TOC FU Call Status: Today's TOC FU Call Status:: Unsuccessful Call (3rd Attempt) Unsuccessful Call (3rd Attempt) Date: 02/29/24  Attempted to reach the patient regarding the most recent Inpatient/ED visit.  Follow Up Plan: No further outreach attempts will be made at this time. We have been unable to contact the patient.  Alan Ee, RN, BSN, CEN [employer]- Transition of Care Team.  [facility] [phone]

## 2024-03-03 ENCOUNTER — Other Ambulatory Visit: Payer: Self-pay | Admitting: Nurse Practitioner

## 2024-03-05 ENCOUNTER — Other Ambulatory Visit: Payer: Self-pay

## 2024-03-05 ENCOUNTER — Encounter: Payer: Self-pay | Admitting: Hematology

## 2024-03-05 ENCOUNTER — Emergency Department (HOSPITAL_COMMUNITY)
Admission: EM | Admit: 2024-03-05 | Discharge: 2024-03-06 | Disposition: A | Attending: Emergency Medicine | Admitting: Emergency Medicine

## 2024-03-05 ENCOUNTER — Encounter: Payer: Self-pay | Admitting: Internal Medicine

## 2024-03-05 ENCOUNTER — Ambulatory Visit: Admitting: Internal Medicine

## 2024-03-05 ENCOUNTER — Emergency Department (HOSPITAL_COMMUNITY)

## 2024-03-05 ENCOUNTER — Ambulatory Visit

## 2024-03-05 VITALS — BP 162/76 | HR 70 | Temp 97.9°F | Wt 229.8 lb

## 2024-03-05 DIAGNOSIS — D649 Anemia, unspecified: Secondary | ICD-10-CM | POA: Diagnosis not present

## 2024-03-05 DIAGNOSIS — I10 Essential (primary) hypertension: Secondary | ICD-10-CM | POA: Insufficient documentation

## 2024-03-05 DIAGNOSIS — R1084 Generalized abdominal pain: Secondary | ICD-10-CM | POA: Diagnosis not present

## 2024-03-05 DIAGNOSIS — K922 Gastrointestinal hemorrhage, unspecified: Secondary | ICD-10-CM | POA: Diagnosis not present

## 2024-03-05 DIAGNOSIS — D259 Leiomyoma of uterus, unspecified: Secondary | ICD-10-CM | POA: Diagnosis not present

## 2024-03-05 DIAGNOSIS — Z85038 Personal history of other malignant neoplasm of large intestine: Secondary | ICD-10-CM | POA: Insufficient documentation

## 2024-03-05 DIAGNOSIS — R1013 Epigastric pain: Secondary | ICD-10-CM | POA: Diagnosis not present

## 2024-03-05 DIAGNOSIS — R110 Nausea: Secondary | ICD-10-CM | POA: Diagnosis not present

## 2024-03-05 DIAGNOSIS — Z09 Encounter for follow-up examination after completed treatment for conditions other than malignant neoplasm: Secondary | ICD-10-CM

## 2024-03-05 DIAGNOSIS — Z79899 Other long term (current) drug therapy: Secondary | ICD-10-CM | POA: Insufficient documentation

## 2024-03-05 DIAGNOSIS — R109 Unspecified abdominal pain: Secondary | ICD-10-CM | POA: Diagnosis not present

## 2024-03-05 DIAGNOSIS — C799 Secondary malignant neoplasm of unspecified site: Secondary | ICD-10-CM

## 2024-03-05 DIAGNOSIS — Z0389 Encounter for observation for other suspected diseases and conditions ruled out: Secondary | ICD-10-CM | POA: Diagnosis not present

## 2024-03-05 DIAGNOSIS — R1010 Upper abdominal pain, unspecified: Secondary | ICD-10-CM | POA: Diagnosis present

## 2024-03-05 DIAGNOSIS — C787 Secondary malignant neoplasm of liver and intrahepatic bile duct: Secondary | ICD-10-CM | POA: Diagnosis not present

## 2024-03-05 DIAGNOSIS — Z933 Colostomy status: Secondary | ICD-10-CM | POA: Diagnosis not present

## 2024-03-05 DIAGNOSIS — C229 Malignant neoplasm of liver, not specified as primary or secondary: Secondary | ICD-10-CM | POA: Insufficient documentation

## 2024-03-05 LAB — COMPREHENSIVE METABOLIC PANEL WITH GFR
ALT: 19 U/L (ref 0–44)
AST: 25 U/L (ref 15–41)
Albumin: 3.9 g/dL (ref 3.5–5.0)
Alkaline Phosphatase: 115 U/L (ref 38–126)
Anion gap: 10 (ref 5–15)
BUN: 16 mg/dL (ref 8–23)
CO2: 25 mmol/L (ref 22–32)
Calcium: 10.2 mg/dL (ref 8.9–10.3)
Chloride: 102 mmol/L (ref 98–111)
Creatinine, Ser: 0.84 mg/dL (ref 0.44–1.00)
GFR, Estimated: >60 mL/min (ref >60)
Glucose, Bld: 144 mg/dL — ABNORMAL HIGH (ref 70–99)
Potassium: 3.8 mmol/L (ref 3.5–5.1)
Sodium: 136 mmol/L (ref 135–145)
Total Bilirubin: 0.6 mg/dL (ref 0.0–1.2)
Total Protein: 7.6 g/dL (ref 6.5–8.1)

## 2024-03-05 LAB — TROPONIN T, HIGH SENSITIVITY
Troponin T High Sensitivity: 19 ng/L (ref 0–19)
Troponin T High Sensitivity: 18 ng/L (ref 0–19)

## 2024-03-05 LAB — I-STAT CHEM 8, ED
BUN: 16 mg/dL (ref 8–23)
Calcium, Ion: 1.31 mmol/L (ref 1.15–1.40)
Chloride: 102 mmol/L (ref 98–111)
Creatinine, Ser: 0.9 mg/dL (ref 0.44–1.00)
Glucose, Bld: 90 mg/dL (ref 70–99)
HCT: 36 % (ref 36.0–46.0)
Hemoglobin: 12.2 g/dL (ref 12.0–15.0)
Potassium: 3.8 mmol/L (ref 3.5–5.1)
Sodium: 136 mmol/L (ref 135–145)
TCO2: 25 mmol/L (ref 22–32)

## 2024-03-05 LAB — CBC
HCT: 36.4 % (ref 36.0–46.0)
Hemoglobin: 11.4 g/dL — ABNORMAL LOW (ref 12.0–15.0)
MCH: 30.2 pg (ref 26.0–34.0)
MCHC: 31.3 g/dL (ref 30.0–36.0)
MCV: 96.3 fL (ref 80.0–100.0)
Platelets: 252 K/uL (ref 150–400)
RBC: 3.78 MIL/uL — ABNORMAL LOW (ref 3.87–5.11)
RDW: 15.7 % — ABNORMAL HIGH (ref 11.5–15.5)
WBC: 5.9 K/uL (ref 4.0–10.5)
nRBC: 0 % (ref 0.0–0.2)

## 2024-03-05 LAB — LIPASE, BLOOD: Lipase: <10 U/L — ABNORMAL LOW (ref 11–51)

## 2024-03-05 MED ORDER — HYDRALAZINE HCL 20 MG/ML IJ SOLN
5.0000 mg | Freq: Once | INTRAMUSCULAR | Status: AC
  Administered 2024-03-05: 5 mg via INTRAVENOUS
  Filled 2024-03-05: qty 1

## 2024-03-05 MED ORDER — MORPHINE SULFATE (PF) 4 MG/ML IV SOLN
4.0000 mg | Freq: Once | INTRAVENOUS | Status: AC
  Administered 2024-03-05: 4 mg via INTRAVENOUS
  Filled 2024-03-05: qty 1

## 2024-03-05 MED ORDER — ONDANSETRON HCL 4 MG/2ML IJ SOLN
4.0000 mg | Freq: Once | INTRAMUSCULAR | Status: AC
  Administered 2024-03-05: 4 mg via INTRAVENOUS
  Filled 2024-03-05: qty 2

## 2024-03-05 MED ORDER — IOHEXOL 300 MG/ML  SOLN
100.0000 mL | Freq: Once | INTRAMUSCULAR | Status: AC | PRN
  Administered 2024-03-05: 100 mL via INTRAVENOUS

## 2024-03-05 NOTE — ED Triage Notes (Signed)
 Patient c/o generalized abdominal pain x 3 days. Patient states no output on her colostomy bag since Saturday. Patient report worsening nausea, denies vomiting. Patient denies fever at home. Hx Colon Ca.

## 2024-03-05 NOTE — Discharge Instructions (Addendum)
 The CT scan did not show any acute blockage. Continue your home medications.  Follow-up with your primary care doctor to recheck your blood pressure.

## 2024-03-05 NOTE — ED Provider Notes (Signed)
 [place] EMERGENCY DEPARTMENT AT [facility] Provider Note   CSN: [id number] Arrival date & time: 03/05/24  8071     Patient presents with: Abdominal Pain   Casey Black is a 63 y.o. female.    Abdominal Pain    Patient has a history of hypertension, small bowel obstruction, colon cancer.  Patient states she had a prior colostomy in the past.  She has not had any issues with that over the last several years.  Patient states since Thanksgiving however she has noted some decreased output from her colostomy.  Patient states she has been feeling nausea and developing pressure in her upper abdomen.  She has not had any vomiting.  She does not have any chest pain.  Patient states she has had very minimal output from her colostomy.  She went to see her doctor today who ordered testing.  Patient states her symptoms got worse so she came to the ED  Prior to Admission medications   Medication Sig Start Date End Date Taking? Authorizing Provider  acetaminophen  (TYLENOL ) 325 MG tablet Take 2 tablets (650 mg total) by mouth every 6 (six) hours as needed. Patient taking differently: Take 650 mg by mouth every 6 (six) hours as needed for mild pain (pain score 1-3) (or headaches). 02/03/19   Rizwan, Saima, MD  amLODipine  (NORVASC ) 10 MG tablet TAKE 1 TABLET BY MOUTH EVERY DAY 08/15/23   [name], [name], MD  aspirin  EC 81 MG tablet Take 1 tablet (81 mg total) by mouth daily. Swallow whole. Patient not taking: Reported on 03/05/2024 02/14/24   Raenelle Coria, MD  dexamethasone  (DECADRON ) 4 MG tablet Take 2 tablets (8 mg total) by mouth daily. Start the day after chemotherapy for 2 days. Take with food. 10/27/23   [name], MD  ferrous sulfate  325 (65 FE) MG tablet TAKE 1 TABLET BY MOUTH TWICE A DAY WITH FOOD 08/19/23   [name], MD  gabapentin  (NEURONTIN ) 300 MG capsule TAKE 1 CAPSULE BY MOUTH TWICE A DAY 03/05/24   [name], [name], NP  hydrALAZINE  (APRESOLINE ) 25 MG tablet TAKE  1 TABLET BY MOUTH EVERY 8 HOURS Patient taking differently: Take 25 mg by mouth in the morning and at bedtime. 08/15/23   [name], [name], MD  hydrochlorothiazide  (HYDRODIURIL ) 12.5 MG tablet TAKE 1 TABLET BY MOUTH EVERY DAY Patient taking differently: Take 12.5 mg by mouth at bedtime. 07/18/23   [name], [name], MD  isosorbide  mononitrate (IMDUR ) 30 MG 24 hr tablet TAKE 1 TABLET BY MOUTH EVERY DAY 08/15/23   [name], [name], MD  levothyroxine  (SYNTHROID ) 25 MCG tablet Take 25 mcg by mouth daily before breakfast.    [provider]  lidocaine -prilocaine  (EMLA ) cream APPLY 1 APPLICATION TOPICALLY AS NEEDED. 06/02/22   [name], MD  loperamide  (IMODIUM ) 2 MG capsule Take 2 tabs by mouth with first loose stool, then 1 tab with each additional loose stool as needed. Do not exceed 8 tabs in a 24-hour period Patient taking differently: Take 4 mg by mouth See admin instructions. Take 4 mg (2 tablets) by mouth with first loose stool, then 2 mg (1 tablet) with each additional loose stool as needed. Do not exceed 16 mg (8 tablets) in a 24-hour period. 10/27/23   [name], MD  ondansetron  (ZOFRAN ) 8 MG tablet Take 1 tablet (8 mg total) by mouth every 8 (eight) hours as needed for nausea, vomiting or refractory nausea / vomiting. Start on the  third day after chemotherapy. 10/27/23   [name], MD  prochlorperazine  (COMPAZINE ) 10 MG tablet Take 1 tablet (10 mg total) by mouth every 6 (six) hours as needed for nausea or vomiting. 10/27/23   [name], MD  Vitamin D , Ergocalciferol , (DRISDOL ) 1.25 MG (50000 UNIT) CAPS capsule Take 1 capsule (50,000 Units total) by mouth every 7 (seven) days for 12 doses. Patient taking differently: Take 50,000 Units by mouth every Friday. 01/24/24 04/11/24  Theophilus Delma Tully [name], MD    Allergies: Patient has no known allergies.    Review of Systems  Gastrointestinal:  Positive for abdominal pain.    Updated Vital Signs BP (!) 193/89    Pulse (!) 57   Temp 98.5 F (36.9 C) (Oral)   Resp 16   Ht 1.702 m (5' 7)   Wt 105 kg   SpO2 96%   BMI 36.26 kg/m   Physical Exam Vitals and nursing note reviewed.  Constitutional:      General: She is not in acute distress.    Appearance: She is well-developed.  HENT:     Head: Normocephalic and atraumatic.     Right Ear: External ear normal.     Left Ear: External ear normal.  Eyes:     General: No scleral icterus.       Right eye: No discharge.        Left eye: No discharge.     Conjunctiva/sclera: Conjunctivae normal.  Neck:     Trachea: No tracheal deviation.  Cardiovascular:     Rate and Rhythm: Normal rate and regular rhythm.  Pulmonary:     Effort: Pulmonary effort is normal. No respiratory distress.     Breath sounds: Normal breath sounds. No stridor. No wheezing or rales.  Abdominal:     General: Bowel sounds are normal. There is no distension.     Palpations: Abdomen is soft.     Tenderness: There is abdominal tenderness in the epigastric area. There is no guarding or rebound.     Comments: Colostomy bag without any significant stool noted  Musculoskeletal:        General: No tenderness or deformity.     Cervical back: Neck supple.  Skin:    General: Skin is warm and dry.     Findings: No rash.  Neurological:     General: No focal deficit present.     Mental Status: She is alert.     Cranial Nerves: No cranial nerve deficit, dysarthria or facial asymmetry.     Sensory: No sensory deficit.     Motor: No abnormal muscle tone or seizure activity.     Coordination: Coordination normal.  Psychiatric:        Mood and Affect: Mood normal.     (all labs ordered are listed, but only abnormal results are displayed) Labs Reviewed  COMPREHENSIVE METABOLIC PANEL WITH GFR - Abnormal; Notable for the following components:      Result Value   Glucose, Bld 144 (*)    All other components within normal limits  CBC - Abnormal; Notable for the following components:    RBC 3.78 (*)    Hemoglobin 11.4 (*)    RDW 15.7 (*)    All other components within normal limits  LIPASE, BLOOD - Abnormal; Notable for the following components:   Lipase <10 (*)    All other components within normal limits  I-STAT CHEM 8, ED  TROPONIN T, HIGH SENSITIVITY  TROPONIN T, HIGH SENSITIVITY  EKG: None  Radiology: CT ABDOMEN PELVIS W CONTRAST Result Date: 03/05/2024 EXAM: CT ABDOMEN AND PELVIS WITH CONTRAST 03/05/2024 10:39:03 PM TECHNIQUE: CT of the abdomen and pelvis was performed with the administration of 100 mL of iohexol  (OMNIPAQUE ) 300 MG/ML solution. Multiplanar reformatted images are provided for review. Automated exposure control, iterative reconstruction, and/or weight-based adjustment of the mA/kV was utilized to reduce the radiation dose to as low as reasonably achievable. COMPARISON: CT 02/22/2024. CLINICAL HISTORY: Abdominal pain, acute, nonlocalized; Bowel obstruction suspected. FINDINGS: LOWER CHEST: No acute abnormality. LIVER: Increased liver metastases compared to prior. For example, a noncalcified mass in the left hepatic lobe on series 2 image 22 measures 2.9 x 2.4 cm and is slightly increased compared to 01/26/2024 when it measured 2.7x2.3 cm . A metastasis in the posterior right hepatic lobe on series 2 image 28 measures 3.2 x 2.6 cm and has increased compared to prior when it measured 2.8 x 2.4 cm. GALLBLADDER AND BILE DUCTS: Gallbladder is unremarkable. No biliary ductal dilatation. SPLEEN: No acute abnormality. PANCREAS: No acute abnormality. ADRENAL GLANDS: No acute abnormality. KIDNEYS, URETERS AND BLADDER: No stones in the kidneys or ureters. No hydronephrosis. No perinephric or periureteral stranding. Urinary bladder is unremarkable. GI AND BOWEL: Stomach demonstrates no acute abnormality. Partial colectomy with right-sided colostomy. Normal appendix. There is no bowel obstruction. PERITONEUM AND RETROPERITONEUM: No ascites. No free air. VASCULATURE:  Aorta is normal in caliber. Aortic atherosclerotic calcification. LYMPH NODES: No lymphadenopathy. REPRODUCTIVE ORGANS: The endometrium measures 19 mm. Is similar uterine fibroid. BONES AND SOFT TISSUES: No acute osseous abnormality. No focal soft tissue abnormality. IMPRESSION: 1. Progression of hepatic metastases. 2. Endometrium measures 19 mm. This is abnormal in a postmenopausal patient. Further evaluation with non-emergent pelvic ultrasound is recommended. 3. History of partial colectomy with right-sided colostomy. No bowel obstruction. Electronically signed by: Norman Gatlin MD 03/05/2024 11:08 PM EST RP Workstation: [id number]     Procedures   Medications Ordered in the ED  hydrALAZINE  (APRESOLINE ) tablet 25 mg (has no administration in time range)  morphine  (PF) 4 MG/ML injection 4 mg (4 mg Intravenous Given 03/05/24 2035)  ondansetron  (ZOFRAN ) injection 4 mg (4 mg Intravenous Given 03/05/24 2035)  iohexol  (OMNIPAQUE ) 300 MG/ML solution 100 mL (100 mLs Intravenous Contrast Given 03/05/24 2229)  hydrALAZINE  (APRESOLINE ) injection 5 mg (5 mg Intravenous Given 03/05/24 2326)    Clinical Course as of 03/06/24 0003  Mon Mar 05, 2024  2143 CBC(!) CBC metabolic panel normal.  Lipase normal.  Troponin normal. [JK]  2328 CT scan shows progression of hepatic metastases.  No signs of bowel obstruction [JK]    Clinical Course User Index [JK] Randol Simmonds, MD                                 Medical Decision Making Differential diagnosis includes but not limited to bowel obstruction, constipation, pancreatitis hepatitis  Problems Addressed: Abdominal pain, unspecified abdominal location: acute illness or injury that poses a threat to life or bodily functions Hypertension, unspecified type: acute illness or injury that poses a threat to life or bodily functions Metastatic malignant neoplasm, unspecified site [facility]): acute illness or injury that poses a threat to life or bodily functions  Amount  and/or Complexity of Data Reviewed Labs: ordered. Decision-making details documented in ED Course. Radiology: ordered.  Risk Prescription drug management.  Patient presents to the ED with complaints of abdominal pain bloating.  Patient was concerned about  possible recurrent bowel obstruction.  Patient's laboratory test did not show any signs of leukocytosis.  No signs of hepatitis no pancreatitis.  Noted cardiac symptoms but her EKG is reassuring and troponin is normal.  Doubt cardiac etiology.  CT abdomen pelvis was performed and does not show any signs of obstruction.  Patient does have known hepatic metastases.  This could be contributing to her abdominal discomfort.  While patient was in the ED she did note increased output from her colostomy.  Patient states she is feeling better.  Patient also noted to be hypertensive.  She was given additional dose of medications.  Will have her continue her home medication and follow-up with her primary care doctor to be rechecked      Final diagnoses:  Abdominal pain, unspecified abdominal location  Metastatic malignant neoplasm, unspecified site [facility])  Hypertension, unspecified type    ED Discharge Orders     None          Randol Simmonds, MD 03/06/24 0003

## 2024-03-05 NOTE — Progress Notes (Signed)
 Hospital follow-up visit     CC/Reason for Visit: Hospitalization follow-up  HPI: Casey Black is a 63 y.o. female who is coming in today for the above mentioned reasons, specifically transitional care services face-to-face visit.    Dates hospitalized: 02/22/2024-02/24/2024 Days since discharge from hospital: 10 Patient was discharged from the hospital to: Home Reason for admission to hospital: Lower GI bleed Date of interactive phone contact with patient and/or caregiver: 3 unsuccessful attempts on 11/24, 11/25 and 11/26  I have reviewed in detail patient's discharge summary plus pertinent specific notes, labs, and images from the hospitalization.  Yes  She was admitted to the hospital after having significant rectal bloody discharge.  She has a history of colon cancer and has a colostomy.  Prior to that she had been in the hospital with a TIA and was sent home on 21 days of dual antiplatelet therapy.  While in the hospital aspirin  and Plavix  were discontinued and her bleeding stopped.  Discharge hemoglobin was 10.6.  Since Thanksgiving dinner she has been having abdominal bloating, nausea and minimal ostomy output.  She has had a history of SBO in the past.  Medication reconciliation was done today and patient is taking meds as recommended by discharging hospitalist/specialist.  Yes   Past Medical/Surgical History: Past Medical History:  Diagnosis Date   Anemia    low iron   Colon cancer (HCC) 01/2019   Hypertension 01/23/2019   Personal history of chemotherapy 01/2019   colon CA   SBO (small bowel obstruction) (HCC) 01/23/2019    Past Surgical History:  Procedure Laterality Date   CESAREAN SECTION     x2   COLOSTOMY N/A 01/24/2019   Procedure: End Loop Colostomy;  Surgeon: [name], MD;  Location: [facility] OR;  Service: General;  Laterality: N/A;   PARTIAL COLECTOMY N/A 01/24/2019   Procedure: PARTIAL COLECTOMY;  Surgeon: [name], MD;  Location:  [facility] OR;  Service: General;  Laterality: N/A;   PORTACATH PLACEMENT Right 02/28/2019   Procedure: INSERTION PORT-A-CATH WITH ULTRASOUND GUIDANCE;  Surgeon: [name], MD;  Location: [facility] OR;  Service: General;  Laterality: Right;    Social History:  reports that she quit smoking about 5 years ago. Her smoking use included cigarettes. She started smoking about 34 years ago. She has a 14.5 pack-year smoking history. She has never used smokeless tobacco. She reports that she does not currently use alcohol. She reports that she does not use drugs.  Allergies: No Known Allergies  Family History:  Family History  Problem Relation Age of Onset   Heart murmur Mother    Kidney failure Father    Hypertension Father    Hypertension Sister      Current Outpatient Medications:    acetaminophen  (TYLENOL ) 325 MG tablet, Take 2 tablets (650 mg total) by mouth every 6 (six) hours as needed. (Patient taking differently: Take 650 mg by mouth every 6 (six) hours as needed for mild pain (pain score 1-3) (or headaches).), Disp: , Rfl:    amLODipine  (NORVASC ) 10 MG tablet, TAKE 1 TABLET BY MOUTH EVERY DAY, Disp: 90 tablet, Rfl: 1   dexamethasone  (DECADRON ) 4 MG tablet, Take 2 tablets (8 mg total) by mouth daily. Start the day after chemotherapy for 2 days. Take with food., Disp: 8 tablet, Rfl: 5   ferrous sulfate  325 (65 FE) MG tablet, TAKE 1 TABLET BY MOUTH TWICE A DAY WITH FOOD, Disp: 180 tablet, Rfl: 1   gabapentin  (  NEURONTIN ) 300 MG capsule, TAKE 1 CAPSULE BY MOUTH TWICE A DAY, Disp: 60 capsule, Rfl: 3   hydrALAZINE  (APRESOLINE ) 25 MG tablet, TAKE 1 TABLET BY MOUTH EVERY 8 HOURS (Patient taking differently: Take 25 mg by mouth in the morning and at bedtime.), Disp: 270 tablet, Rfl: 0   hydrochlorothiazide  (HYDRODIURIL ) 12.5 MG tablet, TAKE 1 TABLET BY MOUTH EVERY DAY (Patient taking differently: Take 12.5 mg by mouth at bedtime.), Disp: 90 tablet, Rfl: 1   isosorbide  mononitrate (IMDUR ) 30 MG 24 hr  tablet, TAKE 1 TABLET BY MOUTH EVERY DAY, Disp: 90 tablet, Rfl: 1   levothyroxine  (SYNTHROID ) 25 MCG tablet, Take 25 mcg by mouth daily before breakfast., Disp: , Rfl:    lidocaine -prilocaine  (EMLA ) cream, APPLY 1 APPLICATION TOPICALLY AS NEEDED., Disp: 30 g, Rfl: 1   loperamide  (IMODIUM ) 2 MG capsule, Take 2 tabs by mouth with first loose stool, then 1 tab with each additional loose stool as needed. Do not exceed 8 tabs in a 24-hour period (Patient taking differently: Take 4 mg by mouth See admin instructions. Take 4 mg (2 tablets) by mouth with first loose stool, then 2 mg (1 tablet) with each additional loose stool as needed. Do not exceed 16 mg (8 tablets) in a 24-hour period.), Disp: 60 capsule, Rfl: 3   ondansetron  (ZOFRAN ) 8 MG tablet, Take 1 tablet (8 mg total) by mouth every 8 (eight) hours as needed for nausea, vomiting or refractory nausea / vomiting. Start on the third day after chemotherapy., Disp: 30 tablet, Rfl: 1   prochlorperazine  (COMPAZINE ) 10 MG tablet, Take 1 tablet (10 mg total) by mouth every 6 (six) hours as needed for nausea or vomiting., Disp: 30 tablet, Rfl: 1   Vitamin D , Ergocalciferol , (DRISDOL ) 1.25 MG (50000 UNIT) CAPS capsule, Take 1 capsule (50,000 Units total) by mouth every 7 (seven) days for 12 doses. (Patient taking differently: Take 50,000 Units by mouth every Friday.), Disp: 12 capsule, Rfl: 0   [Paused] aspirin  EC 81 MG tablet, Take 1 tablet (81 mg total) by mouth daily. Swallow whole. (Patient not taking: Reported on 03/05/2024), Disp: 30 tablet, Rfl: 12 No current facility-administered medications for this visit.  Facility-Administered Medications Ordered in Other Visits:    sodium chloride  flush (NS) 0.9 % injection 10 mL, 10 mL, Intracatheter, PRN, [name], MD, 10 mL at 06/06/20 1051  Review of Systems:  Negative except as mentioned in HPI.    Physical Exam: Vitals:   03/05/24 1121 03/05/24 1125  BP: (!) 164/90 (!) 162/76  Pulse: 70   Temp: 97.9 F  (36.6 C)   TempSrc: Oral   SpO2: 100%   Weight: 229 lb 12.8 oz (104.2 kg)     Body mass index is 35.46 kg/m.   Physical Exam Abdominal:     General: Abdomen is flat. Bowel sounds are decreased. There is no distension.     Tenderness: There is generalized abdominal tenderness. There is no guarding or rebound.     Impression and Plan:  Hospital discharge follow-up  Anemia, unspecified type - Plan: CBC with Differential/Platelet  Lower GI bleed  Generalized abdominal pain - Plan: Comprehensive metabolic panel with GFR, DG Abd 2 Views  Nausea - Plan: DG Abd 2 Views  -Hospital records have been reviewed.  Agree with holding off on antiplatelet therapy for now.  Bleeding has stopped, check hemoglobin. - I am concerned that now she might have an SBO given minimal ostomy output, nausea and distention.  Will send for abdominal  x-rays.  Have advised that if symptoms continue may need ED evaluation. - She has Zofran  at home and will use.  Medical decision making of high complexity was utilized today.      Tully [name], MD [place] [facility]

## 2024-03-06 DIAGNOSIS — C229 Malignant neoplasm of liver, not specified as primary or secondary: Secondary | ICD-10-CM | POA: Diagnosis not present

## 2024-03-06 MED ORDER — HYDRALAZINE HCL 25 MG PO TABS
25.0000 mg | ORAL_TABLET | Freq: Once | ORAL | Status: AC
  Administered 2024-03-06: 25 mg via ORAL
  Filled 2024-03-06: qty 1

## 2024-03-06 MED ORDER — HEPARIN SOD (PORK) LOCK FLUSH 100 UNIT/ML IV SOLN
INTRAVENOUS | Status: AC
  Administered 2024-03-06: 500 [IU]
  Filled 2024-03-06: qty 5

## 2024-03-08 ENCOUNTER — Inpatient Hospital Stay: Admitting: Hematology

## 2024-03-08 ENCOUNTER — Encounter: Payer: Self-pay | Admitting: Hematology

## 2024-03-08 ENCOUNTER — Inpatient Hospital Stay: Attending: Hematology

## 2024-03-08 ENCOUNTER — Inpatient Hospital Stay

## 2024-03-08 VITALS — BP 141/57 | HR 62 | Temp 98.1°F | Resp 17 | Ht 67.0 in | Wt 228.8 lb

## 2024-03-08 DIAGNOSIS — C787 Secondary malignant neoplasm of liver and intrahepatic bile duct: Secondary | ICD-10-CM | POA: Diagnosis not present

## 2024-03-08 DIAGNOSIS — Z5111 Encounter for antineoplastic chemotherapy: Secondary | ICD-10-CM | POA: Diagnosis present

## 2024-03-08 DIAGNOSIS — C189 Malignant neoplasm of colon, unspecified: Secondary | ICD-10-CM

## 2024-03-08 DIAGNOSIS — Z79899 Other long term (current) drug therapy: Secondary | ICD-10-CM | POA: Insufficient documentation

## 2024-03-08 DIAGNOSIS — C779 Secondary and unspecified malignant neoplasm of lymph node, unspecified: Secondary | ICD-10-CM | POA: Insufficient documentation

## 2024-03-08 DIAGNOSIS — C184 Malignant neoplasm of transverse colon: Secondary | ICD-10-CM | POA: Insufficient documentation

## 2024-03-08 LAB — CMP (CANCER CENTER ONLY)
ALT: 21 U/L (ref 0–44)
AST: 31 U/L (ref 15–41)
Albumin: 4 g/dL (ref 3.5–5.0)
Alkaline Phosphatase: 119 U/L (ref 38–126)
Anion gap: 9 (ref 5–15)
BUN: 16 mg/dL (ref 8–23)
CO2: 26 mmol/L (ref 22–32)
Calcium: 10 mg/dL (ref 8.9–10.3)
Chloride: 101 mmol/L (ref 98–111)
Creatinine: 1.03 mg/dL — ABNORMAL HIGH (ref 0.44–1.00)
GFR, Estimated: >60 mL/min (ref >60)
Glucose, Bld: 102 mg/dL — ABNORMAL HIGH (ref 70–99)
Potassium: 4.5 mmol/L (ref 3.5–5.1)
Sodium: 135 mmol/L (ref 135–145)
Total Bilirubin: 0.7 mg/dL (ref 0.0–1.2)
Total Protein: 8 g/dL (ref 6.5–8.1)

## 2024-03-08 LAB — CBC WITH DIFFERENTIAL (CANCER CENTER ONLY)
Abs Immature Granulocytes: 0.02 K/uL (ref 0.00–0.07)
Basophils Absolute: 0.1 K/uL (ref 0.0–0.1)
Basophils Relative: 1 %
Eosinophils Absolute: 0.1 K/uL (ref 0.0–0.5)
Eosinophils Relative: 2 %
HCT: 34.1 % — ABNORMAL LOW (ref 36.0–46.0)
Hemoglobin: 11.1 g/dL — ABNORMAL LOW (ref 12.0–15.0)
Immature Granulocytes: 0 %
Lymphocytes Relative: 33 %
Lymphs Abs: 2.2 K/uL (ref 0.7–4.0)
MCH: 30.2 pg (ref 26.0–34.0)
MCHC: 32.6 g/dL (ref 30.0–36.0)
MCV: 92.7 fL (ref 80.0–100.0)
Monocytes Absolute: 1.2 K/uL — ABNORMAL HIGH (ref 0.1–1.0)
Monocytes Relative: 17 %
Neutro Abs: 3.2 K/uL (ref 1.7–7.7)
Neutrophils Relative %: 47 %
Platelet Count: 254 K/uL (ref 150–400)
RBC: 3.68 MIL/uL — ABNORMAL LOW (ref 3.87–5.11)
RDW: 15.4 % (ref 11.5–15.5)
WBC Count: 6.7 K/uL (ref 4.0–10.5)
nRBC: 0 % (ref 0.0–0.2)

## 2024-03-08 LAB — CEA (ACCESS): CEA (CHCC): 574.77 ng/mL — ABNORMAL HIGH (ref 0.00–5.00)

## 2024-03-08 MED ORDER — ATROPINE SULFATE 1 MG/ML IV SOLN
0.5000 mg | Freq: Once | INTRAVENOUS | Status: AC | PRN
  Administered 2024-03-08: 0.5 mg via INTRAVENOUS
  Filled 2024-03-08: qty 1

## 2024-03-08 MED ORDER — PALONOSETRON HCL INJECTION 0.25 MG/5ML
0.2500 mg | Freq: Once | INTRAVENOUS | Status: AC
  Administered 2024-03-08: 0.25 mg via INTRAVENOUS
  Filled 2024-03-08: qty 5

## 2024-03-08 MED ORDER — SODIUM CHLORIDE 0.9 % IV SOLN: INTRAVENOUS | Status: DC

## 2024-03-08 MED ORDER — SODIUM CHLORIDE 0.9 % IV SOLN
400.0000 mg/m2 | Freq: Once | INTRAVENOUS | Status: AC
  Administered 2024-03-08: 912 mg via INTRAVENOUS
  Filled 2024-03-08: qty 45.6

## 2024-03-08 MED ORDER — SODIUM CHLORIDE 0.9 % IV SOLN
180.0000 mg/m2 | Freq: Once | INTRAVENOUS | Status: AC
  Administered 2024-03-08: 400 mg via INTRAVENOUS
  Filled 2024-03-08: qty 15

## 2024-03-08 MED ORDER — DEXAMETHASONE SOD PHOSPHATE PF 10 MG/ML IJ SOLN
10.0000 mg | Freq: Once | INTRAMUSCULAR | Status: AC
  Administered 2024-03-08: 10 mg via INTRAVENOUS

## 2024-03-08 MED ORDER — SODIUM CHLORIDE 0.9 % IV SOLN
2200.0000 mg/m2 | INTRAVENOUS | Status: DC
  Administered 2024-03-08: 5000 mg via INTRAVENOUS
  Filled 2024-03-08: qty 100

## 2024-03-08 NOTE — Assessment & Plan Note (Signed)
 [id number] stage IV with liver and nodal metastasis, MSS, KRAS G12S(+)  -Diagnosed in 01/2019 after emergent colectomy and liver biopsy. 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  -Started first line chemo on 03/12/2019, received 5FU/leuc with first 2 cycles for large open abdominal wound after surgery. She started full dose FOLFOX and avastin  with cycle 2. Due to neuropathy Oxaliplatin  was stopped after 09/24/19.  --For convenience she was switched to oral chemo Xeloda  2 weeks on/1 week off and Bev every 3 weeks in 01/21/20. She is tolerating well -Her restaging CT scan from 03/11/2023 showed stable liver metastasis, no other new lesions.  -CT 09/29/2023 showed mild disease progression in liver  -treatment changed to FOLFIRI and beva on 10/27/2023

## 2024-03-08 NOTE — Progress Notes (Signed)
 [facility]   Telephone:([phone] Fax:([phone]) [phone]   Clinic Follow up Note   Patient Care Team: [name], [name], MD as PCP - General (Internal Medicine) [name], MD as PCP - Cardiology (Cardiology) [name], MD as Consulting Physician (General Surgery) [name], MD as Consulting Physician (Hematology) [name], [name], NP as Nurse Practitioner (Nurse Practitioner)  Date of Service:  03/08/2024  CHIEF COMPLAINT: f/u of metastatic colon cancer  CURRENT THERAPY:  Second line chemotherapy FOLFIRI  Oncology History   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  -Started first line chemo on 03/12/2019, received 5FU/leuc with first 2 cycles for large open abdominal wound after surgery. She started full dose FOLFOX and avastin  with cycle 2. Due to neuropathy Oxaliplatin  was stopped after 09/24/19.  --For convenience she was switched to oral chemo Xeloda  2 weeks on/1 week off and Bev every 3 weeks in 01/21/20. She is tolerating well -Her restaging CT scan from 03/11/2023 showed stable liver metastasis, no other new lesions.  -CT 09/29/2023 showed mild disease progression in liver  -treatment changed to FOLFIRI and beva on 10/27/2023  Assessment & Plan Metastatic colon cancer  Progression of metastatic colon cancer with liver lesions increasing in size and stable mediastinal lymph node involvement. Current chemotherapy regimen is no longer adequately controlling her disease. -Discussed potential treatment options including changing chemotherapy agents or considering Y90 radioembolization for liver lesions. Emphasized the importance of continuing treatment to prolong disease control, acknowledging the challenges of treating advanced cancer.  Discussed the possibility of using oral medications like Lonsurf, Stivarga or immunotherapy if iv chemotherapy options are exhausted. Highlighted that chemotherapy cannot cure the cancer and that tumor mutations may complicate treatment over time. - Continue current chemotherapy regimen without bevacizumab . - Will consult with interventional radiology regarding Y90 radioembolization for liver lesions.   Constipation Recent episode of constipation following Thanksgiving meal, possibly related to dietary changes or medication effects. Symptoms improved with increased fluid intake and dietary adjustments. Miralax was initially ineffective, but symptoms improved with hospital treatment and dietary changes. - Increase Miralax dose if needed. - Consider senna or milk of magnesia for constipation relief. - Monitor bowel movements and adjust treatment as necessary.  History of transient ischemic attack (TIA) or minor stroke Recent episode of numbness on the left side, initially suspected to be related to TIA. MRI showed no acute stroke. Blood thinners were held due to recent bleeding episode. No further neurological symptoms reported. - Continue to hold blood thinners due to recent bleeding episode.  Plan - I reviewed her recent course of hospital admission and the ED visit, including recent CT abdomen pelvis. 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 Bowel-gas pattern consistent  with small bowel obstruction. No free air. 2. No acute chest findings.  01/23/2019 Imaging   CT AP IMPRESSION: Obstructing mid transverse colonic mass with mild regional adenopathy and hepatic metastatic disease. The mass likely extends through the serosa; no ascites or peritoneal nodularity.   01/24/2019 Surgery   Surgeon: Mitzie DELENA Freund MD Assistant: Harlene Ferraris PA-C Procedure performed: Transverse colectomy with end colostomy, liver biopsy Procedure classification: URGENT/EMERGENT Preop diagnosis: Obstructing, metastatic transverse colon mass Post-op diagnosis/intraop findings: Same   01/24/2019 Pathology Results   FINAL MICROSCOPIC DIAGNOSIS:   A. COLON, TRANSVERSE, RESECTION:  Colonic adenocarcinoma, 5 cm.  Carcinoma extends into pericolonic connective tissue and focally to  serosal surface.  Margins not involved.  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   B. LIVER NODULE, LEFT, BIOPSY:  Metastatic adenocarcinoma.    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 Hypermetabolic metastatic disease in the liver and mediastinal/hilar/axillary lymph nodes. 2. Focal hypermetabolism in the rectum. Continued attention on follow-up exams is warranted. 3. Focal hypermetabolism medial to the right adrenal gland may be within a metastatic lymph node, better visualized on 01/23/2019. 4. Aortic atherosclerosis (ICD10-170.0). Coronary artery calcification.   03/12/2019 -  Chemotherapy   She started 5FU q2weeks on 03/12/19 for 2 cycles. She started full dose FOLFOX with Avastin  on 04/09/19. Oxaliplatin  dose reduced repeatedly due to neuropathy C12 and held since C16 on 10/09/19. Now on maintenance  Avastin  and 5FU q2weeks since 10/09/19       -Maintenance change to maintenance xeloda  2000 mg BID days 1-14 q21 days and q3 weeks Zirabev  (15 mg/kg) starting 01/21/20. First cycle was taken 1000mg  BID due to misunderstanding.    05/31/2019 Imaging   Restaging CT CAP IMPRESSION: 1. Similar to mild interval decrease in size of multiple hepatic lesions, partially calcified. 2. 2 mm right upper lobe pulmonary nodule. Recommend attention on follow-up. 3. Emphysema and aortic atherosclerosis.   08/23/2019 Imaging   CT CAP w contrast  IMPRESSION: 1. The dominant peripheral right liver metastasis has mildly increased. Other smaller liver metastases are stable. 2. Otherwise no new or progressive metastatic disease in the chest, abdomen or pelvis. 3. Aortic Atherosclerosis (ICD10-I70.0) and Emphysema (ICD10-J43.9).   11/27/2019 Imaging   CT CAP w contrast  IMPRESSION: Status post transverse colectomy with right mid abdominal colostomy.   Mildly progressive hepatic metastases, as above.   No evidence of metastatic disease in the chest. Small mediastinal lymph nodes are within normal limits.   Additional stable ancillary findings as above.   01/21/2020 - 10/06/2023 Chemotherapy   Patient is on Treatment Plan : COLORECTAL Bevacizumab  q21d     02/21/2020 Imaging   IMPRESSION: 1. Stable hepatic metastatic disease. 2. Aortic atherosclerosis (ICD10-I70.0). Coronary artery calcification. 3.  Emphysema (ICD10-J43.9).   05/19/2020 Imaging   CT CAP  IMPRESSION: 1. Multiple partially calcified liver metastases are again noted. With the exception of a small lesion in segment 7/8 lesions are not significantly changed in the interval. No new liver lesions identified. 2. Coronary artery atherosclerotic calcifications. 3. Aortic atherosclerosis. 4. 2 mm right upper lobe lung nodule identified.  Unchanged.   Aortic Atherosclerosis (ICD10-I70.0).   11/17/2020 Imaging   CT CAP  IMPRESSION: 1.  Partially calcified lesions throughout the liver are stable accounting for differences in technique,  contrasted imaging on today's study is compared to noncontrast imaging on the prior. 2. No new hepatic lesions. 3. Tiny 3 mm pulmonary nodule in the RIGHT upper lobe unchanged since the prior study. Attention on follow-up. 4. Mild fullness of RIGHT paratracheal nodal tissue is minimally increased and borderline enlarged, attention on follow-up. 5. RIGHT lower quadrant colostomy. 6. 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 7. Aortic atherosclerosis.   06/09/2021 Imaging   EXAM: CT CHEST, ABDOMEN, AND PELVIS WITH CONTRAST  IMPRESSION: Stable calcified liver metastases.   Stable mildly enlarged right paratracheal lymph node.   No evidence of new or progressive metastatic disease.   Aortic Atherosclerosis (ICD10-I70.0).   09/14/2021 PET scan   IMPRESSION: Multiple hypermetabolic and partially calcified liver metastases, without significant change compared to most recent CT.   Mild hypermetabolic mediastinal and bilateral hilar lymphadenopathy, also without significant change.   No evidence of new or progressive metastatic disease.     Electronically Signed   By: Norleen DELENA Kil M.D.   On: 09/15/2021 11:04   04/15/2022 Imaging    IMPRESSION: 1. Several previously noted partially calcified hepatic metastases appear grossly stable compared to the prior examination. No new hepatic lesions or other signs of metastatic disease noted elsewhere in the chest, abdomen or pelvis. 2. Aortic atherosclerosis, in addition to left main and three-vessel coronary artery disease. Please note that although the presence of coronary artery calcium  documents the presence of coronary artery disease, the severity of this disease and any potential stenosis cannot be assessed on this  non-gated CT examination. Assessment for potential risk factor modification, dietary therapy or pharmacologic therapy may be warranted, if clinically indicated. 3. There are calcifications of the mitral annulus. Echocardiographic correlation for evaluation of potential valvular dysfunction may be warranted if clinically indicated. 4. Additional incidental findings, similar to prior studies, as above.   07/15/2022 Imaging    IMPRESSION: 1. Stable exam. No new or progressive findings in the abdomen or pelvis to suggest worsening disease. 2. No substantial change in calcified liver metastases. 3. Right abdominal transverse end colostomy with parastomal herniation of fat and colon proximal to the stoma. 4. Probable left-sided uterine fibroid measuring on the order of 3.1 cm and displacing the endometrial stripe to the right. Pelvic ultrasound could be used to further evaluate as clinically warranted. 5.  Aortic Atherosclerosis (ICD10-I70.0).     07/07/2023 Imaging   CT chest, abdomen, and pelvis IMPRESSION: Partially calcified treated hepatic metastatic disease remains stable, except for mild increase in size of a single small low-attenuation lesion in central left hepatic lobe suspicious for enlarging metastasis. Recommend correlation with tumor markers, and consider short-term follow-up by CT in 3-6 months.   No other sites of metastatic disease identified.   Stable uterine fibroid.     10/27/2023 -  Chemotherapy   Patient is on Treatment Plan : COLORECTAL FOLFIRI + Bevacizumab  q14d        Discussed the use of AI scribe software for clinical note transcription with the patient, who gave verbal consent to proceed.  History of Present Illness Casey Black is a 63 year old female with metastatic colon cancer who presents for follow-up.  She recently had two emergency room visits. The first was for bleeding, which led to stopping her blood thinners and aspirin . The second was  after Thanksgiving for severe abdominal pain, nausea, and no output from her  ostomy. Black X-ray was obtained outpatient, then a CT in the emergency room showed no bile duct issues.  She had nausea and difficulty with bowel movements. She tried Miralax twice without relief. After receiving morphine  in the hospital, her pain and nausea improved, and she was able to eat and drink. Her bowel movements have since improved.  She had a minor stroke around November 9th with left-sided numbness. MRI at that time showed no acute stroke. She was started on blood thinners, which were later stopped because of bleeding.  She has not had a PET scan since June 2023, which showed multiple liver lesions and mild hypermetabolic activity in the mediastinum and bilateral hilar regions.  She currently has no bleeding, her left-sided numbness has resolved, and she is not on blood thinners.     All other systems were reviewed with the patient and are negative.  MEDICAL HISTORY:  Past Medical History:  Diagnosis Date   Anemia    low iron   Colon cancer (HCC) 01/2019   Hypertension 01/23/2019   Personal history of chemotherapy 01/2019   colon CA   SBO (small bowel obstruction) (HCC) 01/23/2019    SURGICAL HISTORY: Past Surgical History:  Procedure Laterality Date   CESAREAN SECTION     x2   COLOSTOMY N/A 01/24/2019   Procedure: End Loop Colostomy;  Surgeon: [name], MD;  Location: [facility] OR;  Service: General;  Laterality: N/A;   PARTIAL COLECTOMY N/A 01/24/2019   Procedure: PARTIAL COLECTOMY;  Surgeon: [name], MD;  Location: [facility] OR;  Service: General;  Laterality: N/A;   PORTACATH PLACEMENT Right 02/28/2019   Procedure: INSERTION PORT-A-CATH WITH ULTRASOUND GUIDANCE;  Surgeon: [name], MD;  Location: [facility] OR;  Service: General;  Laterality: Right;    I have reviewed the social history and family history with the patient and they are unchanged from previous note.  ALLERGIES:  has  no known allergies.  MEDICATIONS:  Current Outpatient Medications  Medication Sig Dispense Refill   acetaminophen  (TYLENOL ) 325 MG tablet Take 2 tablets (650 mg total) by mouth every 6 (six) hours as needed. (Patient taking differently: Take 650 mg by mouth every 6 (six) hours as needed for mild pain (pain score 1-3) (or headaches).)     amLODipine  (NORVASC ) 10 MG tablet TAKE 1 TABLET BY MOUTH EVERY DAY 90 tablet 1   [Paused] aspirin  EC 81 MG tablet Take 1 tablet (81 mg total) by mouth daily. Swallow whole. (Patient not taking: Reported on 03/05/2024) 30 tablet 12   dexamethasone  (DECADRON ) 4 MG tablet Take 2 tablets (8 mg total) by mouth daily. Start the day after chemotherapy for 2 days. Take with food. 8 tablet 5   ferrous sulfate  325 (65 FE) MG tablet TAKE 1 TABLET BY MOUTH TWICE A DAY WITH FOOD 180 tablet 1   gabapentin  (NEURONTIN ) 300 MG capsule TAKE 1 CAPSULE BY MOUTH TWICE A DAY 60 capsule 3   hydrALAZINE  (APRESOLINE ) 25 MG tablet TAKE 1 TABLET BY MOUTH EVERY 8 HOURS (Patient taking differently: Take 25 mg by mouth in the morning and at bedtime.) 270 tablet 0   hydrochlorothiazide  (HYDRODIURIL ) 12.5 MG tablet TAKE 1 TABLET BY MOUTH EVERY DAY (Patient taking differently: Take 12.5 mg by mouth at bedtime.) 90 tablet 1   isosorbide  mononitrate (IMDUR ) 30 MG 24 hr tablet TAKE 1 TABLET BY MOUTH EVERY DAY 90 tablet 1   levothyroxine  (SYNTHROID ) 25 MCG tablet Take 25 mcg by mouth daily before breakfast.  lidocaine -prilocaine  (EMLA ) cream APPLY 1 APPLICATION TOPICALLY AS NEEDED. 30 g 1   loperamide  (IMODIUM ) 2 MG capsule Take 2 tabs by mouth with first loose stool, then 1 tab with each additional loose stool as needed. Do not exceed 8 tabs in a 24-hour period (Patient taking differently: Take 4 mg by mouth See admin instructions. Take 4 mg (2 tablets) by mouth with first loose stool, then 2 mg (1 tablet) with each additional loose stool as needed. Do not exceed 16 mg (8 tablets) in a 24-hour  period.) 60 capsule 3   ondansetron  (ZOFRAN ) 8 MG tablet Take 1 tablet (8 mg total) by mouth every 8 (eight) hours as needed for nausea, vomiting or refractory nausea / vomiting. Start on the third day after chemotherapy. 30 tablet 1   prochlorperazine  (COMPAZINE ) 10 MG tablet Take 1 tablet (10 mg total) by mouth every 6 (six) hours as needed for nausea or vomiting. 30 tablet 1   Vitamin D , Ergocalciferol , (DRISDOL ) 1.25 MG (50000 UNIT) CAPS capsule Take 1 capsule (50,000 Units total) by mouth every 7 (seven) days for 12 doses. (Patient taking differently: Take 50,000 Units by mouth every Friday.) 12 capsule 0   No current facility-administered medications for this visit.   Facility-Administered Medications Ordered in Other Visits  Medication Dose Route Frequency Provider Last Rate Last Admin   sodium chloride  flush (NS) 0.9 % injection 10 mL  10 mL Intracatheter PRN [name], MD   10 mL at 06/06/20 1051    PHYSICAL EXAMINATION: ECOG PERFORMANCE STATUS: 1 - Symptomatic but completely ambulatory  Vitals:   03/08/24 1124 03/08/24 1126  BP: (!) 156/65 (!) 141/57  Pulse: (!) 58 62  Resp:    Temp:    SpO2: 100% 100%   Wt Readings from Last 3 Encounters:  03/08/24 228 lb 12.8 oz (103.8 kg)  03/05/24 231 lb 7.7 oz (105 kg)  03/05/24 229 lb 12.8 oz (104.2 kg)     GENERAL:alert, no distress and comfortable SKIN: skin color, texture, turgor are normal, no rashes or significant lesions EYES: normal, Conjunctiva are pink and non-injected, sclera clear NECK: supple, thyroid  normal size, non-tender, without nodularity LYMPH:  no palpable lymphadenopathy in the cervical, axillary  LUNGS: clear to auscultation and percussion with normal breathing effort HEART: regular rate & rhythm and no murmurs and no lower extremity edema ABDOMEN:abdomen soft, non-tender and normal bowel sounds Musculoskeletal:no cyanosis of digits and no clubbing  NEURO: alert & oriented x 3 with fluent speech, no focal  motor/sensory deficits  Physical Exam    LABORATORY DATA:  I have reviewed the data as listed    Latest Ref Rng & Units 03/08/2024   10:51 AM 03/05/2024    8:47 PM 03/05/2024    8:40 PM  CBC  WBC 4.0 - 10.5 K/uL 6.7   5.9   Hemoglobin 12.0 - 15.0 g/dL 88.8  87.7  88.5   Hematocrit 36.0 - 46.0 % 34.1  36.0  36.4   Platelets 150 - 400 K/uL 254   252         Latest Ref Rng & Units 03/08/2024   10:51 AM 03/05/2024    8:47 PM 03/05/2024    8:40 PM  CMP  Glucose 70 - 99 mg/dL 897  90  855   BUN 8 - 23 mg/dL 16  16  16    Creatinine 0.44 - 1.00 mg/dL 8.96  9.09  9.15   Sodium 135 - 145 mmol/L 135  136  136  Potassium 3.5 - 5.1 mmol/L 4.5  3.8  3.8   Chloride 98 - 111 mmol/L 101  102  102   CO2 22 - 32 mmol/L 26   25   Calcium  8.9 - 10.3 mg/dL 89.9   89.7   Total Protein 6.5 - 8.1 g/dL 8.0   7.6   Total Bilirubin 0.0 - 1.2 mg/dL 0.7   0.6   Alkaline Phos 38 - 126 U/L 119   115   AST 15 - 41 U/L 31   25   ALT 0 - 44 U/L 21   19       RADIOGRAPHIC STUDIES: I have personally reviewed the radiological images as listed and agreed with the findings in the report. No results found.    Orders Placed This Encounter  Procedures   NM PET Image Restag (PS) Skull Base To Thigh    Standing Status:   Future    Expected Date:   03/22/2024    Expiration Date:   03/08/2025    If indicated for the ordered procedure, I authorize the administration of a radiopharmaceutical per Radiology protocol:   Yes    Preferred imaging location?:   [facility]   IR Radiologist Eval & Mgmt    Standing Status:   Future    Expiration Date:   03/08/2025    Reason for Exam (SYMPTOM  OR DIAGNOSIS REQUIRED):   evaluate liver directed therapy for metastatic colon cancer in liver    Preferred Imaging Location?:   [facility]   CBC with Differential (Cancer Center Only)    Standing Status:   Future    Expected Date:   03/22/2024    Expiration Date:   03/22/2025   CMP (Cancer Center only)    Standing  Status:   Future    Expected Date:   03/22/2024    Expiration Date:   03/22/2025   Total Protein, Urine dipstick    Standing Status:   Future    Expected Date:   03/22/2024    Expiration Date:   03/22/2025   All questions were answered. The patient knows to call the clinic with any problems, questions or concerns. No barriers to learning was detected. The total time spent in the appointment was 40 minutes, including review of chart and various tests results, discussions about plan of care and coordination of care plan     Casey Mattock, MD 03/08/2024

## 2024-03-08 NOTE — Patient Instructions (Signed)
 [facility] - A DEPT OF [facility]  Discharge Instructions: Thank you for choosing [facility] to provide your oncology and hematology care.   If you have a lab appointment with the Cancer Center, please go directly to the Cancer Center and check in at the registration area.   Wear comfortable clothing and clothing appropriate for easy access to any Portacath or PICC line.   We strive to give you quality time with your provider. You may need to reschedule your appointment if you arrive late (15 or more minutes).  Arriving late affects you and other patients whose appointments are after yours.  Also, if you miss three or more appointments without notifying the office, you may be dismissed from the clinic at the provider's discretion.      For prescription refill requests, have your pharmacy contact our office and allow 72 hours for refills to be completed.    Today you received the following chemotherapy and/or immunotherapy agents: Irinotecan , leucovorin      To help prevent nausea and vomiting after your treatment, we encourage you to take your nausea medication as directed.  BELOW ARE SYMPTOMS THAT SHOULD BE REPORTED IMMEDIATELY: *FEVER GREATER THAN 100.4 F (38 C) OR HIGHER *CHILLS OR SWEATING *NAUSEA AND VOMITING THAT IS NOT CONTROLLED WITH YOUR NAUSEA MEDICATION *UNUSUAL SHORTNESS OF BREATH *UNUSUAL BRUISING OR BLEEDING *URINARY PROBLEMS (pain or burning when urinating, or frequent urination) *BOWEL PROBLEMS (unusual diarrhea, constipation, pain near the anus) TENDERNESS IN MOUTH AND THROAT WITH OR WITHOUT PRESENCE OF ULCERS (sore throat, sores in mouth, or a toothache) UNUSUAL RASH, SWELLING OR PAIN  UNUSUAL VAGINAL DISCHARGE OR ITCHING   Items with * indicate a potential emergency and should be followed up as soon as possible or go to the Emergency Department if any problems should occur.  Please show the CHEMOTHERAPY ALERT CARD or  IMMUNOTHERAPY ALERT CARD at check-in to the Emergency Department and triage nurse.  Should you have questions after your visit or need to cancel or reschedule your appointment, please contact [facility] - A DEPT OF [facility]  Dept: [phone]  and follow the prompts.  Office hours are 8:00 a.m. to 4:30 p.m. Monday - Friday. Please note that voicemails left after 4:00 p.m. may not be returned until the following business day.  We are closed weekends and major holidays. You have access to a nurse at all times for urgent questions. Please call the main number to the clinic Dept: [phone] and follow the prompts.   For any non-urgent questions, you may also contact your provider using MyChart. We now offer e-Visits for anyone 20 and older to request care online for non-urgent symptoms. For details visit [email or web address].   Also download the MyChart app! Go to the app store, search MyChart, open the app, select [facility], and log in with your MyChart username and password.

## 2024-03-09 ENCOUNTER — Telehealth: Payer: Self-pay

## 2024-03-09 NOTE — Telephone Encounter (Addendum)
 I called IR spoke with Delon and she stated she could see the Referral and will get it taken care of. [phone]   ----- Message from [facility] sent at 03/09/2024  7:18 AM EST ----- Authorized ----- Message ----- From: [name], MD Sent: 03/08/2024  10:39 PM EST To: [facility]; Norleen KANDICE Spain, CMA  I ordered a PET scan to be done in next 2-3 weeks, please get approved and scheduled  Also inform IR about the consult referral for liver directed therapy   Thx  Callander

## 2024-03-10 ENCOUNTER — Inpatient Hospital Stay

## 2024-03-12 ENCOUNTER — Ambulatory Visit: Payer: Self-pay | Admitting: Internal Medicine

## 2024-03-15 DIAGNOSIS — T8131XD Disruption of external operation (surgical) wound, not elsewhere classified, subsequent encounter: Secondary | ICD-10-CM | POA: Diagnosis not present

## 2024-03-15 DIAGNOSIS — Z933 Colostomy status: Secondary | ICD-10-CM | POA: Diagnosis not present

## 2024-03-15 DIAGNOSIS — K56609 Unspecified intestinal obstruction, unspecified as to partial versus complete obstruction: Secondary | ICD-10-CM | POA: Diagnosis not present

## 2024-03-15 DIAGNOSIS — S31109A Unspecified open wound of abdominal wall, unspecified quadrant without penetration into peritoneal cavity, initial encounter: Secondary | ICD-10-CM | POA: Diagnosis not present

## 2024-03-21 NOTE — Assessment & Plan Note (Signed)
 [id number] stage IV with liver and nodal metastasis, MSS, KRAS G12S(+)  -Diagnosed in 01/2019 after emergent colectomy and liver biopsy. 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  -Started first line chemo on 03/12/2019, received 5FU/leuc with first 2 cycles for large open abdominal wound after surgery. She started full dose FOLFOX and avastin  with cycle 2. Due to neuropathy Oxaliplatin  was stopped after 09/24/19.  --For convenience she was switched to oral chemo Xeloda  2 weeks on/1 week off and Bev every 3 weeks in 01/21/20. She is tolerating well -Her restaging CT scan from 03/11/2023 showed stable liver metastasis, no other new lesions.  -CT 09/29/2023 showed mild disease progression in liver  -treatment changed to FOLFIRI and beva on 10/27/2023

## 2024-03-22 ENCOUNTER — Inpatient Hospital Stay: Admitting: Hematology

## 2024-03-22 ENCOUNTER — Inpatient Hospital Stay

## 2024-03-22 VITALS — BP 128/68 | HR 62 | Temp 97.8°F | Resp 17 | Wt 231.7 lb

## 2024-03-22 DIAGNOSIS — Z5111 Encounter for antineoplastic chemotherapy: Secondary | ICD-10-CM | POA: Diagnosis not present

## 2024-03-22 DIAGNOSIS — C189 Malignant neoplasm of colon, unspecified: Secondary | ICD-10-CM

## 2024-03-22 DIAGNOSIS — C787 Secondary malignant neoplasm of liver and intrahepatic bile duct: Secondary | ICD-10-CM | POA: Diagnosis not present

## 2024-03-22 LAB — CMP (CANCER CENTER ONLY)
ALT: 15 U/L (ref 0–44)
AST: 26 U/L (ref 15–41)
Albumin: 3.9 g/dL (ref 3.5–5.0)
Alkaline Phosphatase: 119 U/L (ref 38–126)
Anion gap: 10 (ref 5–15)
BUN: 15 mg/dL (ref 8–23)
CO2: 24 mmol/L (ref 22–32)
Calcium: 10.1 mg/dL (ref 8.9–10.3)
Chloride: 103 mmol/L (ref 98–111)
Creatinine: 0.89 mg/dL (ref 0.44–1.00)
GFR, Estimated: >60 mL/min (ref >60)
Glucose, Bld: 93 mg/dL (ref 70–99)
Potassium: 4.3 mmol/L (ref 3.5–5.1)
Sodium: 137 mmol/L (ref 135–145)
Total Bilirubin: 0.5 mg/dL (ref 0.0–1.2)
Total Protein: 7.2 g/dL (ref 6.5–8.1)

## 2024-03-22 LAB — CBC WITH DIFFERENTIAL (CANCER CENTER ONLY)
Abs Immature Granulocytes: 0 K/uL (ref 0.00–0.07)
Basophils Absolute: 0 K/uL (ref 0.0–0.1)
Basophils Relative: 1 %
Eosinophils Absolute: 0.3 K/uL (ref 0.0–0.5)
Eosinophils Relative: 8 %
HCT: 32.6 % — ABNORMAL LOW (ref 36.0–46.0)
Hemoglobin: 10.5 g/dL — ABNORMAL LOW (ref 12.0–15.0)
Immature Granulocytes: 0 %
Lymphocytes Relative: 40 %
Lymphs Abs: 1.4 K/uL (ref 0.7–4.0)
MCH: 29.9 pg (ref 26.0–34.0)
MCHC: 32.2 g/dL (ref 30.0–36.0)
MCV: 92.9 fL (ref 80.0–100.0)
Monocytes Absolute: 0.3 K/uL (ref 0.1–1.0)
Monocytes Relative: 9 %
Neutro Abs: 1.4 K/uL — ABNORMAL LOW (ref 1.7–7.7)
Neutrophils Relative %: 42 %
Platelet Count: 166 K/uL (ref 150–400)
RBC: 3.51 MIL/uL — ABNORMAL LOW (ref 3.87–5.11)
RDW: 15.6 % — ABNORMAL HIGH (ref 11.5–15.5)
WBC Count: 3.4 K/uL — ABNORMAL LOW (ref 4.0–10.5)
nRBC: 0 % (ref 0.0–0.2)

## 2024-03-22 LAB — TOTAL PROTEIN, URINE DIPSTICK: Protein, ur: 30 mg/dL — AB

## 2024-03-22 MED ORDER — SODIUM CHLORIDE 0.9 % IV SOLN
2200.0000 mg/m2 | INTRAVENOUS | Status: DC
  Administered 2024-03-22: 13:00:00 5000 mg via INTRAVENOUS
  Filled 2024-03-22: qty 100

## 2024-03-22 MED ORDER — DEXAMETHASONE SOD PHOSPHATE PF 10 MG/ML IJ SOLN
10.0000 mg | Freq: Once | INTRAMUSCULAR | Status: AC
  Administered 2024-03-22: 10:00:00 10 mg via INTRAVENOUS

## 2024-03-22 MED ORDER — ATROPINE SULFATE 1 MG/ML IV SOLN
0.5000 mg | Freq: Once | INTRAVENOUS | Status: AC | PRN
  Administered 2024-03-22: 11:00:00 0.5 mg via INTRAVENOUS
  Filled 2024-03-22: qty 1

## 2024-03-22 MED ORDER — SODIUM CHLORIDE 0.9 % IV SOLN
400.0000 mg/m2 | Freq: Once | INTRAVENOUS | Status: AC
  Administered 2024-03-22: 11:00:00 912 mg via INTRAVENOUS
  Filled 2024-03-22: qty 45.6

## 2024-03-22 MED ORDER — SODIUM CHLORIDE 0.9 % IV SOLN
180.0000 mg/m2 | Freq: Once | INTRAVENOUS | Status: AC
  Administered 2024-03-22: 11:00:00 400 mg via INTRAVENOUS
  Filled 2024-03-22: qty 15

## 2024-03-22 MED ORDER — SODIUM CHLORIDE 0.9% FLUSH: 10.0000 mL | INTRAVENOUS | Status: DC | PRN

## 2024-03-22 MED ADMIN — Palonosetron HCl IV Soln 0.25 MG/5ML (Base Equivalent): 0.25 mg | INTRAVENOUS | @ 09:00:00 | NDC 60505619301

## 2024-03-22 MED FILL — Palonosetron HCl IV Soln 0.25 MG/5ML (Base Equivalent): 0.2500 mg | INTRAVENOUS | Qty: 5 | Status: AC

## 2024-03-22 NOTE — Progress Notes (Signed)
 [facility]   [phone] [phone]   Clinic Follow up Note   Patient Care Team: [name], [name], MD as PCP - General (Internal Medicine) Lynwood Schilling, MD as PCP - Cardiology (Cardiology) [name], MD as Consulting Physician (General Surgery) [name], MD as Consulting Physician (Hematology) [name], [name], NP as Nurse Practitioner (Nurse Practitioner)  Date of Service:  03/22/2024  CHIEF COMPLAINT: f/u of metastatic colon cancer  CURRENT THERAPY:  FOLFIRI  Oncology History   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  -Started first line chemo on 03/12/2019, received 5FU/leuc with first 2 cycles for large open abdominal wound after surgery. She started full dose FOLFOX and avastin  with cycle 2. Due to neuropathy Oxaliplatin  was stopped after 09/24/19.  --For convenience she was switched to oral chemo Xeloda  2 weeks on/1 week off and Bev every 3 weeks in 01/21/20. She is tolerating well -Her restaging CT scan from 03/11/2023 showed stable liver metastasis, no other new lesions.  -CT 09/29/2023 showed mild disease progression in liver  -treatment changed to FOLFIRI and beva on 10/27/2023   Assessment & Plan Metastatic adenocarcinoma of the colon with liver metastases Disease remains active. She is currently asymptomatic, without acute illness, hemorrhage, or neurological deficits. Laboratory evaluation revealed mild leukopenia not requiring intervention, hemoglobin at 10.5 g/dL without transfusion requirement, and normal platelet count. Renal and hepatic function results are pending. She continues to tolerate chemotherapy well and maintains good functional status. - Ordered PET scan to further characterize disease  extent, specifically to assess for extrahepatic involvement and guide consideration of liver-directed therapy versus modification of systemic regimen. - Provided interventional radiology contact information and instructed her to schedule the procedure. - Planned to continue current chemotherapy regimen, with next cycle scheduled this week due to holiday scheduling constraints. - Scheduled follow-up phone visit on December 23rd to review PET scan results and discuss further management. - Reviewed laboratory results; blood counts remain stable, with pending renal and hepatic function tests.  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 Bowel-gas pattern consistent with small bowel obstruction. No free air. 2. No acute chest findings.   01/23/2019 Imaging   CT AP IMPRESSION: Obstructing mid transverse colonic mass with mild regional adenopathy and hepatic metastatic disease. The mass likely extends through the serosa; no ascites or peritoneal nodularity.   01/24/2019 Surgery   Surgeon: [name] Signe MD Assistant: Harlene Ferraris PA-C Procedure performed: Transverse colectomy with end colostomy, liver biopsy Procedure classification: URGENT/EMERGENT Preop diagnosis: Obstructing, metastatic transverse colon mass Post-op diagnosis/intraop findings: Same   01/24/2019 Pathology Results   FINAL  MICROSCOPIC DIAGNOSIS:   A. COLON, TRANSVERSE, RESECTION:  Colonic adenocarcinoma, 5 cm.  Carcinoma extends into pericolonic connective tissue and focally to  serosal surface.  Margins not involved.  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   B. LIVER NODULE, LEFT, BIOPSY:  Metastatic adenocarcinoma.    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 Hypermetabolic metastatic disease in the liver and mediastinal/hilar/axillary lymph nodes. 2. Focal hypermetabolism in the rectum. Continued attention on follow-up exams is warranted. 3. Focal hypermetabolism medial to the right adrenal gland may be within a metastatic lymph node, better visualized on 01/23/2019. 4. Aortic atherosclerosis (ICD10-170.0). Coronary artery calcification.   03/12/2019 -  Chemotherapy   She started 5FU q2weeks on 03/12/19 for 2 cycles. She started full dose FOLFOX with Avastin  on 04/09/19. Oxaliplatin  dose reduced repeatedly due to neuropathy C12 and held since C16 on 10/09/19. Now on maintenance Avastin  and 5FU q2weeks since 10/09/19       -Maintenance change to maintenance xeloda  2000 mg BID days 1-14 q21 days and q3 weeks Zirabev  (15 mg/kg) starting 01/21/20. First cycle was taken 1000mg  BID due to misunderstanding.    05/31/2019 Imaging   Restaging CT CAP IMPRESSION: 1. Similar to mild interval decrease in size of multiple hepatic lesions, partially calcified. 2. 2 mm right upper lobe pulmonary nodule. Recommend attention on follow-up. 3. Emphysema and aortic atherosclerosis.   08/23/2019 Imaging   CT CAP w contrast  IMPRESSION: 1. The dominant peripheral right liver metastasis has mildly increased. Other smaller liver metastases  are stable. 2. Otherwise no new or progressive metastatic disease in the chest, abdomen or pelvis. 3. Aortic Atherosclerosis (ICD10-I70.0) and Emphysema (ICD10-J43.9).   11/27/2019 Imaging   CT CAP w contrast  IMPRESSION: Status post transverse colectomy with right mid abdominal colostomy.   Mildly progressive hepatic metastases, as above.   No evidence of metastatic disease in the chest. Small mediastinal lymph nodes are within normal limits.   Additional stable ancillary findings as above.   01/21/2020 - 10/06/2023 Chemotherapy   Patient is on Treatment Plan : COLORECTAL Bevacizumab  q21d     02/21/2020 Imaging   IMPRESSION: 1. Stable hepatic metastatic disease. 2. Aortic atherosclerosis (ICD10-I70.0). Coronary artery calcification. 3.  Emphysema (ICD10-J43.9).   05/19/2020 Imaging   CT CAP  IMPRESSION: 1. Multiple partially calcified liver metastases are again noted. With the exception of a small lesion in segment 7/8 lesions are not significantly changed in the interval. No new liver lesions identified. 2. Coronary artery atherosclerotic calcifications. 3. Aortic atherosclerosis. 4. 2 mm right upper lobe lung nodule identified.  Unchanged.   Aortic Atherosclerosis (ICD10-I70.0).   11/17/2020 Imaging   CT CAP  IMPRESSION: 1. Partially calcified lesions throughout the liver are stable accounting for differences in technique, contrasted imaging on today's study is compared to noncontrast imaging on the prior. 2. No new hepatic lesions. 3. Tiny 3 mm pulmonary nodule in the RIGHT upper lobe unchanged since the prior study. Attention on follow-up. 4. Mild fullness of RIGHT paratracheal nodal tissue is minimally increased and borderline enlarged, attention on follow-up. 5. RIGHT lower quadrant colostomy. 6. 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 7.  Aortic atherosclerosis.   06/09/2021 Imaging  EXAM: CT CHEST, ABDOMEN, AND PELVIS WITH CONTRAST  IMPRESSION: Stable calcified liver metastases.   Stable mildly enlarged right paratracheal lymph node.   No evidence of new or progressive metastatic disease.   Aortic Atherosclerosis (ICD10-I70.0).   09/14/2021 PET scan   IMPRESSION: Multiple hypermetabolic and partially calcified liver metastases, without significant change compared to most recent CT.   Mild hypermetabolic mediastinal and bilateral hilar lymphadenopathy, also without significant change.   No evidence of new or progressive metastatic disease.     Electronically Signed   By: Norleen DELENA Kil M.D.   On: 09/15/2021 11:04   04/15/2022 Imaging    IMPRESSION: 1. Several previously noted partially calcified hepatic metastases appear grossly stable compared to the prior examination. No new hepatic lesions or other signs of metastatic disease noted elsewhere in the chest, abdomen or pelvis. 2. Aortic atherosclerosis, in addition to left main and three-vessel coronary artery disease. Please note that although the presence of coronary artery calcium  documents the presence of coronary artery disease, the severity of this disease and any potential stenosis cannot be assessed on this non-gated CT examination. Assessment for potential risk factor modification, dietary therapy or pharmacologic therapy may be warranted, if clinically indicated. 3. There are calcifications of the mitral annulus. Echocardiographic correlation for evaluation of potential valvular dysfunction may be warranted if clinically indicated. 4. Additional incidental findings, similar to prior studies, as above.   07/15/2022 Imaging    IMPRESSION: 1. Stable exam. No new or progressive findings in the abdomen or pelvis to suggest worsening disease. 2. No substantial change in calcified liver metastases. 3. Right abdominal transverse end colostomy with  parastomal herniation of fat and colon proximal to the stoma. 4. Probable left-sided uterine fibroid measuring on the order of 3.1 cm and displacing the endometrial stripe to the right. Pelvic ultrasound could be used to further evaluate as clinically warranted. 5.  Aortic Atherosclerosis (ICD10-I70.0).     07/07/2023 Imaging   CT chest, abdomen, and pelvis IMPRESSION: Partially calcified treated hepatic metastatic disease remains stable, except for mild increase in size of a single small low-attenuation lesion in central left hepatic lobe suspicious for enlarging metastasis. Recommend correlation with tumor markers, and consider short-term follow-up by CT in 3-6 months.   No other sites of metastatic disease identified.   Stable uterine fibroid.     10/27/2023 -  Chemotherapy   Patient is on Treatment Plan : COLORECTAL FOLFIRI + Bevacizumab  q14d        Discussed the use of AI scribe software for clinical note transcription with the patient, who gave verbal consent to proceed.  History of Present Illness Casey Black is a 63 year old female with metastatic colon adenocarcinoma with hepatic metastases undergoing systemic chemotherapy who presents for follow-up to assess disease status and ongoing management.  She feels well overall with no recent illness, nausea, vomiting, bleeding, or other acute symptoms. Previous numbness in her arm has resolved without recurrence.  She has not been contacted by interventional radiology about the previously discussed procedure despite returning a call.  She remains active, continues to work, and participates in regular family and holiday activities.     All other systems were reviewed with the patient and are negative.  MEDICAL HISTORY:  Past Medical History:  Diagnosis Date   Anemia    low iron   Colon cancer (HCC) 01/2019   Hypertension 01/23/2019   Personal history of chemotherapy 01/2019   colon CA   SBO (small bowel  obstruction) (HCC) 01/23/2019  SURGICAL HISTORY: Past Surgical History:  Procedure Laterality Date   CESAREAN SECTION     x2   COLOSTOMY N/A 01/24/2019   Procedure: End Loop Colostomy;  Surgeon: [name], MD;  Location: [facility] OR;  Service: General;  Laterality: N/A;   PARTIAL COLECTOMY N/A 01/24/2019   Procedure: PARTIAL COLECTOMY;  Surgeon: [name], MD;  Location: [facility] OR;  Service: General;  Laterality: N/A;   PORTACATH PLACEMENT Right 02/28/2019   Procedure: INSERTION PORT-A-CATH WITH ULTRASOUND GUIDANCE;  Surgeon: [name], MD;  Location: [facility] OR;  Service: General;  Laterality: Right;    I have reviewed the social history and family history with the patient and they are unchanged from previous note.  ALLERGIES:  has no known allergies.  MEDICATIONS:  Current Outpatient Medications  Medication Sig Dispense Refill   acetaminophen  (TYLENOL ) 325 MG tablet Take 2 tablets (650 mg total) by mouth every 6 (six) hours as needed. (Patient taking differently: Take 650 mg by mouth every 6 (six) hours as needed for mild pain (pain score 1-3) (or headaches).)     amLODipine  (NORVASC ) 10 MG tablet TAKE 1 TABLET BY MOUTH EVERY DAY 90 tablet 1   [Paused] aspirin  EC 81 MG tablet Take 1 tablet (81 mg total) by mouth daily. Swallow whole. 30 tablet 12   dexamethasone  (DECADRON ) 4 MG tablet Take 2 tablets (8 mg total) by mouth daily. Start the day after chemotherapy for 2 days. Take with food. 8 tablet 5   ferrous sulfate  325 (65 FE) MG tablet TAKE 1 TABLET BY MOUTH TWICE A DAY WITH FOOD 180 tablet 1   gabapentin  (NEURONTIN ) 300 MG capsule TAKE 1 CAPSULE BY MOUTH TWICE A DAY 60 capsule 3   hydrALAZINE  (APRESOLINE ) 25 MG tablet TAKE 1 TABLET BY MOUTH EVERY 8 HOURS (Patient taking differently: Take 25 mg by mouth in the morning and at bedtime.) 270 tablet 0   hydrochlorothiazide  (HYDRODIURIL ) 12.5 MG tablet TAKE 1 TABLET BY MOUTH EVERY DAY (Patient taking differently: Take 12.5 mg  by mouth at bedtime.) 90 tablet 1   isosorbide  mononitrate (IMDUR ) 30 MG 24 hr tablet TAKE 1 TABLET BY MOUTH EVERY DAY 90 tablet 1   levothyroxine  (SYNTHROID ) 25 MCG tablet Take 25 mcg by mouth daily before breakfast.     lidocaine -prilocaine  (EMLA ) cream APPLY 1 APPLICATION TOPICALLY AS NEEDED. 30 g 1   loperamide  (IMODIUM ) 2 MG capsule Take 2 tabs by mouth with first loose stool, then 1 tab with each additional loose stool as needed. Do not exceed 8 tabs in a 24-hour period (Patient taking differently: Take 4 mg by mouth See admin instructions. Take 4 mg (2 tablets) by mouth with first loose stool, then 2 mg (1 tablet) with each additional loose stool as needed. Do not exceed 16 mg (8 tablets) in a 24-hour period.) 60 capsule 3   ondansetron  (ZOFRAN ) 8 MG tablet Take 1 tablet (8 mg total) by mouth every 8 (eight) hours as needed for nausea, vomiting or refractory nausea / vomiting. Start on the third day after chemotherapy. 30 tablet 1   prochlorperazine  (COMPAZINE ) 10 MG tablet Take 1 tablet (10 mg total) by mouth every 6 (six) hours as needed for nausea or vomiting. 30 tablet 1   Vitamin D , Ergocalciferol , (DRISDOL ) 1.25 MG (50000 UNIT) CAPS capsule Take 1 capsule (50,000 Units total) by mouth every 7 (seven) days for 12 doses. (Patient taking differently: Take 50,000 Units by mouth every Friday.) 12 capsule 0   No  current facility-administered medications for this visit.   Facility-Administered Medications Ordered in Other Visits  Medication Dose Route Frequency Provider Last Rate Last Admin   0.9 %  sodium chloride  infusion   Intravenous Continuous [name], MD   Stopped at 03/22/24 1251   fluorouracil  (ADRUCIL ) 5,000 mg in sodium chloride  0.9 % 150 mL chemo infusion  2,200 mg/m2 (Treatment Plan Recorded) Intravenous 1 day or 1 dose [name], MD   Infusion Verify at 03/22/24 1542   sodium chloride  flush (NS) 0.9 % injection 10 mL  10 mL Intracatheter PRN [name], MD   10 mL at 06/06/20 1051    sodium chloride  flush (NS) 0.9 % injection 10 mL  10 mL Intracatheter PRN [name], MD        PHYSICAL EXAMINATION: ECOG PERFORMANCE STATUS: 1 - Symptomatic but completely ambulatory  Vitals:   03/22/24 0805  BP: 128/68  Pulse: 62  Resp: 17  Temp: 97.8 F (36.6 C)  SpO2: 99%   Wt Readings from Last 3 Encounters:  03/22/24 231 lb 11.2 oz (105.1 kg)  03/08/24 228 lb 12.8 oz (103.8 kg)  03/05/24 231 lb 7.7 oz (105 kg)     GENERAL:alert, no distress and comfortable SKIN: skin color, texture, turgor are normal, no rashes or significant lesions EYES: normal, Conjunctiva are pink and non-injected, sclera clear NECK: supple, thyroid  normal size, non-tender, without nodularity LYMPH:  no palpable lymphadenopathy in the cervical, axillary  LUNGS: clear to auscultation and percussion with normal breathing effort HEART: regular rate & rhythm and no murmurs and no lower extremity edema ABDOMEN:abdomen soft, non-tender and normal bowel sounds Musculoskeletal:no cyanosis of digits and no clubbing  NEURO: alert & oriented x 3 with fluent speech, no focal motor/sensory deficits  Physical Exam    LABORATORY DATA:  I have reviewed the data as listed    Latest Ref Rng & Units 03/22/2024    7:49 AM 03/08/2024   10:51 AM 03/05/2024    8:47 PM  CBC  WBC 4.0 - 10.5 K/uL 3.4  6.7    Hemoglobin 12.0 - 15.0 g/dL 89.4  88.8  87.7   Hematocrit 36.0 - 46.0 % 32.6  34.1  36.0   Platelets 150 - 400 K/uL 166  254          Latest Ref Rng & Units 03/22/2024    7:49 AM 03/08/2024   10:51 AM 03/05/2024    8:47 PM  CMP  Glucose 70 - 99 mg/dL 93  897  90   BUN 8 - 23 mg/dL 15  16  16    Creatinine 0.44 - 1.00 mg/dL 9.10  8.96  9.09   Sodium 135 - 145 mmol/L 137  135  136   Potassium 3.5 - 5.1 mmol/L 4.3  4.5  3.8   Chloride 98 - 111 mmol/L 103  101  102   CO2 22 - 32 mmol/L 24  26    Calcium  8.9 - 10.3 mg/dL 89.8  89.9    Total Protein 6.5 - 8.1 g/dL 7.2  8.0    Total Bilirubin 0.0 - 1.2  mg/dL 0.5  0.7    Alkaline Phos 38 - 126 U/L 119  119    AST 15 - 41 U/L 26  31    ALT 0 - 44 U/L 15  21        RADIOGRAPHIC STUDIES: I have personally reviewed the radiological images as listed and agreed with the findings in the report. No results  found.    Orders Placed This Encounter  Procedures   CBC with Differential (Cancer Center Only)    Standing Status:   Future    Expected Date:   04/12/2024    Expiration Date:   04/12/2025   CMP (Cancer Center only)    Standing Status:   Future    Expected Date:   04/12/2024    Expiration Date:   04/12/2025   CBC with Differential (Cancer Center Only)    Standing Status:   Future    Expected Date:   04/26/2024    Expiration Date:   04/26/2025   CMP (Cancer Center only)    Standing Status:   Future    Expected Date:   04/26/2024    Expiration Date:   04/26/2025   All questions were answered. The patient knows to call the clinic with any problems, questions or concerns. No barriers to learning was detected. The total time spent in the appointment was 30 minutes, including review of chart and various tests results, discussions about plan of care and coordination of care plan     Onita Mattock, MD 03/22/2024

## 2024-03-22 NOTE — Progress Notes (Signed)
 Ok to proceed with CMP from 12/4 per Dr. Lanny.  Casey Black, PharmD, MBA

## 2024-03-22 NOTE — Progress Notes (Signed)
 Per Dr. Lanny no GCSF, I may change her tx based on her PET next week. Ok to tx with ANC=1.4  Kesean Serviss, PharmD, MBA

## 2024-03-23 ENCOUNTER — Inpatient Hospital Stay: Admission: RE | Admit: 2024-03-23 | Discharge: 2024-03-23 | Attending: Hematology

## 2024-03-23 ENCOUNTER — Encounter: Payer: Self-pay | Admitting: Hematology

## 2024-03-23 DIAGNOSIS — C189 Malignant neoplasm of colon, unspecified: Secondary | ICD-10-CM

## 2024-03-23 DIAGNOSIS — K7689 Other specified diseases of liver: Secondary | ICD-10-CM | POA: Diagnosis not present

## 2024-03-23 HISTORY — PX: IR RADIOLOGIST EVAL & MGMT: IMG5224

## 2024-03-23 NOTE — Consult Note (Signed)
 "      Chief Complaint: Patient was seen in consultation today for liver directed therapy at the request of Feng,Yan  Referring Physician(s): Feng,Yan  History of Present Illness: Casey Black is a 63 y.o. 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 Recent brief summary of her oncologic therapy per Dr. Lanny:  Adenocarcinoma of colon metastatic to liver [facility]) [id number] stage IV with liver and nodal metastasis, MSS, KRAS G12S(+)  -Diagnosed in 01/2019 after emergent colectomy and liver biopsy. 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  -Started first line chemo on 03/12/2019, received 5FU/leuc with first 2 cycles for large open abdominal wound after surgery. She started full dose FOLFOX and avastin  with cycle 2. Due to neuropathy Oxaliplatin  was stopped after 09/24/19.  --For convenience she was switched to oral chemo Xeloda  2 weeks on/1 week off and Bev every 3 weeks in 01/21/20. She is tolerating well -Her restaging CT scan from 03/11/2023 showed stable liver metastasis, no other new lesions.  -CT 09/29/2023 showed mild disease progression in liver  -treatment changed to FOLFIRI and beva on 10/27/2023  CEA has consistently been rising from 283 six months ago to 545 two weeks ago.   Casey Black is doing well with treatment and denies any significant symptoms. She does have neuropathy of her hands and feet. She had a mild clinical stroke in November and was placed on anticoagulation which was stopped shortly thereafter due to significant acute rectal bleeding which has since resolved. 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 She is independent in activities of daily living, drives a car and lives with her daughter and granddaughter. She has 3 sisters.  She is scheduled for a PET scan next Monday, 03/26/24. I have reviewed  multiple prior staging CT studies detailed below.  Past Medical History:  Diagnosis Date   Anemia    low iron   Colon cancer (HCC) 01/2019   Hypertension 01/23/2019   Personal history of chemotherapy 01/2019   colon CA   SBO (small bowel obstruction) (HCC) 01/23/2019    Past Surgical History:  Procedure Laterality Date   CESAREAN SECTION     x2   COLOSTOMY N/A 01/24/2019   Procedure: End Loop Colostomy;  Surgeon: [name], MD;  Location: [facility] OR;  Service: General;  Laterality: N/A;   PARTIAL COLECTOMY N/A 01/24/2019   Procedure: PARTIAL COLECTOMY;  Surgeon: [name], MD;  Location: [facility] OR;  Service: General;  Laterality: N/A;   PORTACATH PLACEMENT Right 02/28/2019   Procedure: INSERTION PORT-A-CATH WITH ULTRASOUND GUIDANCE;  Surgeon: [name], MD;  Location: [facility] OR;  Service: General;  Laterality: Right;    Allergies: Patient has no known allergies.  Medications: Prior to Admission medications  Medication Sig Start Date End Date Taking? Authorizing Provider  acetaminophen  (TYLENOL ) 325 MG tablet Take 2 tablets (650 mg total) by mouth every 6 (six) hours as needed. Patient taking differently: Take 650 mg by mouth every 6 (six) hours as needed for mild pain (pain score 1-3) (or headaches). 02/03/19   Rizwan, Saima, MD  amLODipine  (NORVASC ) 10 MG tablet TAKE 1 TABLET BY MOUTH EVERY DAY 08/15/23   [name], [name], MD  [Paused] aspirin  EC 81 MG tablet Take 1 tablet (81 mg total) by mouth daily. Swallow whole. Wait to take this until your doctor or  other [facility] provider tells you to start again. 02/14/24   Raenelle Coria, MD  dexamethasone  (DECADRON ) 4 MG tablet Take 2 tablets (8 mg total) by mouth daily. Start the day after chemotherapy for 2 days. Take with food. 10/27/23   [name], MD  ferrous sulfate  325 (65 FE) MG tablet TAKE 1 TABLET BY MOUTH TWICE A DAY WITH FOOD 08/19/23   [name], MD  gabapentin  (NEURONTIN ) 300 MG capsule TAKE 1 CAPSULE BY  MOUTH TWICE A DAY 03/05/24   [name], [name], NP  hydrALAZINE  (APRESOLINE ) 25 MG tablet TAKE 1 TABLET BY MOUTH EVERY 8 HOURS Patient taking differently: Take 25 mg by mouth in the morning and at bedtime. 08/15/23   [name], [name], MD  hydrochlorothiazide  (HYDRODIURIL ) 12.5 MG tablet TAKE 1 TABLET BY MOUTH EVERY DAY Patient taking differently: Take 12.5 mg by mouth at bedtime. 07/18/23   [name], [name], MD  isosorbide  mononitrate (IMDUR ) 30 MG 24 hr tablet TAKE 1 TABLET BY MOUTH EVERY DAY 08/15/23   [name], [name], MD  levothyroxine  (SYNTHROID ) 25 MCG tablet Take 25 mcg by mouth daily before breakfast.    [provider]  lidocaine -prilocaine  (EMLA ) cream APPLY 1 APPLICATION TOPICALLY AS NEEDED. 06/02/22   [name], MD  loperamide  (IMODIUM ) 2 MG capsule Take 2 tabs by mouth with first loose stool, then 1 tab with each additional loose stool as needed. Do not exceed 8 tabs in a 24-hour period Patient taking differently: Take 4 mg by mouth See admin instructions. Take 4 mg (2 tablets) by mouth with first loose stool, then 2 mg (1 tablet) with each additional loose stool as needed. Do not exceed 16 mg (8 tablets) in a 24-hour period. 10/27/23   [name], MD  ondansetron  (ZOFRAN ) 8 MG tablet Take 1 tablet (8 mg total) by mouth every 8 (eight) hours as needed for nausea, vomiting or refractory nausea / vomiting. Start on the third day after chemotherapy. 10/27/23   [name], MD  prochlorperazine  (COMPAZINE ) 10 MG tablet Take 1 tablet (10 mg total) by mouth every 6 (six) hours as needed for nausea or vomiting. 10/27/23   [name], MD  Vitamin D , Ergocalciferol , (DRISDOL ) 1.25 MG (50000 UNIT) CAPS capsule Take 1 capsule (50,000 Units total) by mouth every 7 (seven) days for 12 doses. Patient taking differently: Take 50,000 Units by mouth every Friday. 01/24/24 04/11/24  [name] [name], MD     Family History  Problem Relation Age of Onset   Heart murmur  Mother    Kidney failure Father    Hypertension Father    Hypertension Sister     Social History   Socioeconomic History   Marital status: Single    Spouse name: Not on file   Number of children: 2   Years of education: Not on file   Highest education level: Not on file  Occupational History   Not on file  Tobacco Use   Smoking status: Former    Current packs/day: 0.00    Average packs/day: 0.5 packs/day for 29.0 years (14.5 ttl pk-yrs)    Types: Cigarettes    Start date: 01/03/1990    Quit date: 01/04/2019    Years since quitting: 5.2   Smokeless tobacco: Never   Tobacco comments:    desires patch  Vaping Use   Vaping status: Never Used  Substance and Sexual Activity   Alcohol use: Not Currently    Comment: a pint a week  Drug use: Never   Sexual activity: Not on file  Other Topics Concern   Not on file  Social History Narrative   Lives with daughter.  Two children.  One grand.     Social Drivers of Health   Tobacco Use: Medium Risk (03/05/2024)   Patient History    Smoking Tobacco Use: Former    Smokeless Tobacco Use: Never    Passive Exposure: Not on file  Financial Resource Strain: Low Risk (02/01/2023)   Overall Financial Resource Strain (CARDIA)    Difficulty of Paying Living Expenses: Not hard at all  Food Insecurity: No Food Insecurity (02/24/2024)   Epic    Worried About Programme Researcher, Broadcasting/film/video in the Last Year: Never true    Ran Out of Food in the Last Year: Never true  Transportation Needs: No Transportation Needs (02/24/2024)   Epic    Lack of Transportation (Medical): No    Lack of Transportation (Non-Medical): No  Physical Activity: Insufficiently Active (02/24/2024)   Exercise Vital Sign    Days of Exercise per Week: 2 days    Minutes of Exercise per Session: 20 min  Stress: No Stress Concern Present (02/24/2024)   [employer] of Occupational Health - Occupational Stress Questionnaire    Feeling of Stress: Not at all  Social Connections:  Moderately Integrated (02/24/2024)   Social Connection and Isolation Panel    Frequency of Communication with Friends and Family: More than three times a week    Frequency of Social Gatherings with Friends and Family: More than three times a week    Attends Religious Services: More than 4 times per year    Active Member of Clubs or Organizations: Yes    Attends Banker Meetings: More than 4 times per year    Marital Status: Never married  Depression (PHQ2-9): Low Risk (03/22/2024)   Depression (PHQ2-9)    PHQ-2 Score: 0  Alcohol Screen: Low Risk (02/01/2023)   Alcohol Screen    Last Alcohol Screening Score (AUDIT): 0  Housing: Unknown (02/24/2024)   Epic    Unable to Pay for Housing in the Last Year: No    Number of Times Moved in the Last Year: Not on file    Homeless in the Last Year: No  Utilities: Not At Risk (02/24/2024)   Epic    Threatened with loss of utilities: No  Health Literacy: Adequate Health Literacy (02/24/2024)   B1300 Health Literacy    Frequency of need for help with medical instructions: Never    ECOG Status: 1 - Symptomatic but completely ambulatory  Review of Systems: A 12 point ROS discussed and pertinent positives are indicated in the HPI above.  All other systems are negative.  Review of Systems  Constitutional: Negative.   Respiratory: Negative.    Cardiovascular:  Positive for leg swelling. Negative for chest pain and palpitations.  Gastrointestinal: Negative.   Genitourinary: Negative.   Musculoskeletal: Negative.   Skin: Negative.   Neurological:  Positive for numbness.       Chronic neuropathy of hands and feet. Can ambulate without assistance or devices.    Vital Signs: BP (!) 149/70 (BP Location: Left Arm, Patient Position: Sitting, Cuff Size: Normal)   Pulse (!) 55   Temp 97.8 F (36.6 C) (Oral)   Resp 18   SpO2 97%     Physical Exam Vitals reviewed.  Constitutional:      General: She is not in acute distress.     Appearance:  Normal appearance. She is not ill-appearing, toxic-appearing or diaphoretic.  Cardiovascular:     Rate and Rhythm: Normal rate and regular rhythm.     Heart sounds: Normal heart sounds. No murmur heard.    No friction rub. No gallop.  Pulmonary:     Effort: Pulmonary effort is normal. No respiratory distress.     Breath sounds: Normal breath sounds. No stridor. No wheezing, rhonchi or rales.  Abdominal:     General: Bowel sounds are normal. There is no distension.     Palpations: Abdomen is soft. There is no mass.     Tenderness: There is no abdominal tenderness. There is no guarding or rebound.     Hernia: No hernia is present.  Musculoskeletal:        General: Swelling present.     Comments: Mild edema of left lower leg  Skin:    General: Skin is warm and dry.  Neurological:     General: No focal deficit present.     Mental Status: She is alert and oriented to person, place, and time.        Imaging: DG Abd 2 Views Result Date: 03/12/2024 EXAM: 2 VIEW XRAY OF THE ABDOMEN 03/05/2024 11:56:50 AM COMPARISON: None available. CLINICAL HISTORY: concern for SBO FINDINGS: BOWEL: Nonobstructive bowel gas pattern. SOFT TISSUES: Right lower quadrant ostomy appliance noted. BONES: No acute fracture. IMPRESSION: 1. No radiographic evidence of small bowel obstruction. Electronically signed by: Dorethia Molt MD 03/12/2024 12:21 AM EST RP Workstation: [id number]   CT ABDOMEN PELVIS W CONTRAST Result Date: 03/05/2024 EXAM: CT ABDOMEN AND PELVIS WITH CONTRAST 03/05/2024 10:39:03 PM TECHNIQUE: CT of the abdomen and pelvis was performed with the administration of 100 mL of iohexol  (OMNIPAQUE ) 300 MG/ML solution. Multiplanar reformatted images are provided for review. Automated exposure control, iterative reconstruction, and/or weight-based adjustment of the mA/kV was utilized to reduce the radiation dose to as low as reasonably achievable. COMPARISON: CT 02/22/2024. CLINICAL HISTORY:  Abdominal pain, acute, nonlocalized; Bowel obstruction suspected. FINDINGS: LOWER CHEST: No acute abnormality. LIVER: Increased liver metastases compared to prior. For example, a noncalcified mass in the left hepatic lobe on series 2 image 22 measures 2.9 x 2.4 cm and is slightly increased compared to 01/26/2024 when it measured 2.7x2.3 cm . A metastasis in the posterior right hepatic lobe on series 2 image 28 measures 3.2 x 2.6 cm and has increased compared to prior when it measured 2.8 x 2.4 cm. GALLBLADDER AND BILE DUCTS: Gallbladder is unremarkable. No biliary ductal dilatation. SPLEEN: No acute abnormality. PANCREAS: No acute abnormality. ADRENAL GLANDS: No acute abnormality. KIDNEYS, URETERS AND BLADDER: No stones in the kidneys or ureters. No hydronephrosis. No perinephric or periureteral stranding. Urinary bladder is unremarkable. GI AND BOWEL: Stomach demonstrates no acute abnormality. Partial colectomy with right-sided colostomy. Normal appendix. There is no bowel obstruction. PERITONEUM AND RETROPERITONEUM: No ascites. No free air. VASCULATURE: Aorta is normal in caliber. Aortic atherosclerotic calcification. LYMPH NODES: No lymphadenopathy. REPRODUCTIVE ORGANS: The endometrium measures 19 mm. Is similar uterine fibroid. BONES AND SOFT TISSUES: No acute osseous abnormality. No focal soft tissue abnormality. IMPRESSION: 1. Progression of hepatic metastases. 2. Endometrium measures 19 mm. This is abnormal in a postmenopausal patient. Further evaluation with non-emergent pelvic ultrasound is recommended. 3. History of partial colectomy with right-sided colostomy. No bowel obstruction. Electronically signed by: Norman Gatlin MD 03/05/2024 11:08 PM EST RP Workstation: [id number]   CT ANGIO GI BLEED Result Date: 02/22/2024 CLINICAL DATA:  Possible lower GI bleed. Passing clots  per rectum this morning. History of stage IV colon cancer. Taking aspirin . EXAM: CTA ABDOMEN AND PELVIS WITHOUT AND WITH CONTRAST  TECHNIQUE: Multidetector CT imaging of the abdomen and pelvis was performed using the standard protocol during bolus administration of intravenous contrast. Multiplanar reconstructed images and MIPs were obtained and reviewed to evaluate the vascular anatomy. RADIATION DOSE REDUCTION: This exam was performed according to the departmental dose-optimization program which includes automated exposure control, adjustment of the mA and/or kV according to patient size and/or use of iterative reconstruction technique. CONTRAST:  OMNIPAQUE  IOHEXOL  350 MG/ML SOLN COMPARISON:  01/26/2024 FINDINGS: VASCULAR Aorta: Abdominal aorta is normal in caliber without aneurysm or dissection. Mild calcified plaque over the abdominal aorta. Celiac: Patent without evidence of aneurysm, dissection, vasculitis or significant stenosis. SMA: Mild calcified plaque over the superior mesenteric artery which is otherwise patent without significant stenosis or occlusion. Renals: Calcified plaque at the origin the right renal artery with minimal focal narrowing as the right renal artery is otherwise patent. Moderate focal narrowing over the proximal 1 cm of the left renal artery which is otherwise patent. Small inferior accessory left renal artery. IMA: Patent without evidence of aneurysm, dissection, vasculitis or significant stenosis. Inflow: Patent without evidence of aneurysm, dissection, vasculitis or significant stenosis. Proximal Outflow: Bilateral common femoral and visualized portions of the superficial and profunda femoral arteries are patent without evidence of aneurysm, dissection, vasculitis or significant stenosis. Veins: No obvious venous abnormality within the limitations of this arterial phase study. Review of the MIP images confirms the above findings. NON-VASCULAR Lower chest: Heart is normal size. Calcified plaque over the 3 vessel coronary arteries. Visualized lung bases are clear. Hepatobiliary: Multiple calcified and at  least 1 noncalcified liver metastasis without significant change. Gallbladder is normal. Minimal prominence of the central intrahepatic ducts unchanged. Pancreas: Unremarkable. No pancreatic ductal dilatation or surrounding inflammatory changes. Spleen: Normal in size without focal abnormality. Adrenals/Urinary Tract: Adrenal glands are normal. Kidneys are normal size without hydronephrosis or nephrolithiasis. No focal renal mass. Ureters and bladder are normal. Stomach/Bowel: Stomach is normal. Small bowel is unremarkable. Tangle of small bowel loops over the anterior mid abdomen unchanged possibly due to adhesions. Evidence of patient's previous partial colectomy. Colostomy over the right mid abdomen unchanged. Appendix is normal. Cecum and ileocecal valve located within a peristomal hernia at the ostomy site unchanged. Lymphatic: No adenopathy. Reproductive: Uterus just right of midline with stable 3.5 cm fibroid over the left side of the body of the uterus partially displacing the endometrium to the right. Adnexal regions unremarkable. Other: No free peritoneal fluid. No focal inflammatory change. Retained fecal debris over the rectum unchanged. Musculoskeletal: No focal abnormality. IMPRESSION: 1. No acute findings in the abdomen/pelvis. No evidence of active GI bleed. 2. Evidence of patient's previous partial colectomy with colostomy over the right mid abdomen unchanged. Tangle of small bowel loops over the anterior mid abdomen unchanged possibly due to adhesions. No evidence of bowel obstruction. 3. Multiple calcified and at least 1 noncalcified liver metastasis without significant change. 4. Stable 3.5 cm uterine fibroid. 5. Aortic atherosclerosis. Atherosclerotic coronary artery disease. Aortic Atherosclerosis (ICD10-I70.0). Electronically Signed   By: Toribio Agreste M.D.   On: 02/22/2024 15:52    Labs:  CBC: Recent Labs    02/24/24 0604 03/05/24 2040 03/05/24 2047 03/08/24 1051 03/22/24 0749   WBC 3.1* 5.9  --  6.7 3.4*  HGB 9.8* 11.4* 12.2 11.1* 10.5*  HCT 31.8* 36.4 36.0 34.1* 32.6*  PLT 136* 252  --  254 166    COAGS: Recent Labs    02/12/24 2040  INR 1.0  APTT 27    BMP: Recent Labs    02/24/24 0604 03/05/24 2040 03/05/24 2047 03/08/24 1051 03/22/24 0749  NA 137 136 136 135 137  K 4.6 3.8 3.8 4.5 4.3  CL 103 102 102 101 103  CO2 27 25  --  26 24  GLUCOSE 86 144* 90 102* 93  BUN 15 16 16 16 15   CALCIUM  9.8 10.2  --  10.0 10.1  CREATININE 0.87 0.84 0.90 1.03* 0.89  GFRNONAA >60 >60  --  >60 >60    LIVER FUNCTION TESTS: Recent Labs    02/22/24 1325 03/05/24 2040 03/08/24 1051 03/22/24 0749  BILITOT 0.5 0.6 0.7 0.5  AST 21 25 31 26   ALT 21 19 21 15   ALKPHOS 109 115 119 119  PROT 7.1 7.6 8.0 7.2  ALBUMIN 3.7 3.9 4.0 3.9    TUMOR MARKERS: Recent Labs    12/08/23 0757 01/05/24 1238 02/02/24 1218 03/08/24 1051  CEA 298.37* 330.46* 395.98* 574.77*    Assessment and Plan:  I met with Casey Black and her sister. We reviewed imaging and discussed possible liver directed therapies to treat residual metastatic disease in the liver. I reviewed imaging studies with the following notes:  07/07/23: Stable calcified treated metastatic lesions in both lobes and some increase in a non-calcified left lobe lesion from 1.6 cm to 1.9 cm.  09/29/23: Increase in size of left lobe lesion up to 2-2.3 cm and also more prominent non-calcified hypoenhancing area adjacent to a calcified met in inferior right lobe showing enlargement from about 2.8 cm to 3.3 cm by my measurement.  01/26/24: Increase in left lobe lesion to about 2.6 cm and stable inferior right lobe lesion.  03/05/24: Slight increase in left lobe lesion to 2.8 cm and stable to slightly increased volume of inferior right lobe lesion. Questionable increased hypodensity around higher lateral right lobe lesion.  There is some progression of metastatic disease in the liver over the last 8 months, primarily in  the left lobe. I suspect that the right lobe disease is also progressive, potentially in more than one area given the significant rise in CEA with near doubling over the last 6 months. The PET scan next Monday will be important in determining whether to treat the entire right lobe with Y-90 or target the inferior lesion. The left lobe lesion will clearly be targeted with left hepatic radioembolization.  We discussed radioembolization with Y-90 microspheres and that she is a good candidate for treatment given her normal liver function, excellent performance status and current status of disease. Casey Black would like to proceed with the authorization and scheduling process with initial mapping and planning arteriography followed by treatment of one lobe 1-2 weeks later. We will perform the procedure at [facility] on an outpatient basis.  Thank you for this interesting consult.  I greatly enjoyed meeting Casey Black and look forward to participating in Casey Black.  A copy of this report was sent to the requesting provider on this date.  Electronically Signed: Marcey ONEIDA Moan 03/23/2024, 12:31 PM    I spent a total of  40 Minutes  in face to face in clinical consultation, greater than 50% of which was counseling/coordinating Black for metastatic colon carcinoma to liver with possible radioembolization.  "

## 2024-03-24 ENCOUNTER — Inpatient Hospital Stay

## 2024-03-26 ENCOUNTER — Encounter (HOSPITAL_COMMUNITY)
Admission: RE | Admit: 2024-03-26 | Discharge: 2024-03-26 | Disposition: A | Discharge status: Home / Self Care | Source: Ambulatory Visit | Attending: Hematology | Admitting: Hematology

## 2024-03-26 DIAGNOSIS — C771 Secondary and unspecified malignant neoplasm of intrathoracic lymph nodes: Secondary | ICD-10-CM | POA: Insufficient documentation

## 2024-03-26 DIAGNOSIS — C189 Malignant neoplasm of colon, unspecified: Secondary | ICD-10-CM | POA: Diagnosis present

## 2024-03-26 DIAGNOSIS — I517 Cardiomegaly: Secondary | ICD-10-CM | POA: Diagnosis not present

## 2024-03-26 DIAGNOSIS — C787 Secondary malignant neoplasm of liver and intrahepatic bile duct: Secondary | ICD-10-CM | POA: Insufficient documentation

## 2024-03-26 DIAGNOSIS — I7091 Generalized atherosclerosis: Secondary | ICD-10-CM | POA: Diagnosis not present

## 2024-03-26 DIAGNOSIS — M6208 Separation of muscle (nontraumatic), other site: Secondary | ICD-10-CM | POA: Insufficient documentation

## 2024-03-26 DIAGNOSIS — Z9049 Acquired absence of other specified parts of digestive tract: Secondary | ICD-10-CM | POA: Insufficient documentation

## 2024-03-26 LAB — GLUCOSE, CAPILLARY: Glucose-Capillary: 100 mg/dL — ABNORMAL HIGH (ref 70–99)

## 2024-03-26 MED ORDER — FLUDEOXYGLUCOSE F - 18 (FDG) INJECTION
11.4200 | Freq: Once | INTRAVENOUS | Status: AC
  Administered 2024-03-26: 11.42 via INTRAVENOUS

## 2024-03-27 ENCOUNTER — Inpatient Hospital Stay (HOSPITAL_BASED_OUTPATIENT_CLINIC_OR_DEPARTMENT_OTHER): Admitting: Hematology

## 2024-03-27 DIAGNOSIS — Z5111 Encounter for antineoplastic chemotherapy: Secondary | ICD-10-CM | POA: Diagnosis not present

## 2024-03-27 DIAGNOSIS — C787 Secondary malignant neoplasm of liver and intrahepatic bile duct: Secondary | ICD-10-CM

## 2024-03-27 DIAGNOSIS — C189 Malignant neoplasm of colon, unspecified: Secondary | ICD-10-CM | POA: Diagnosis not present

## 2024-03-27 NOTE — Assessment & Plan Note (Signed)
 [id number] stage IV with liver and nodal metastasis, MSS, KRAS G12S(+)  -Diagnosed in 01/2019 after emergent colectomy and liver biopsy. 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  -Started first line chemo on 03/12/2019, received 5FU/leuc with first 2 cycles for large open abdominal wound after surgery. She started full dose FOLFOX and avastin  with cycle 2. Due to neuropathy Oxaliplatin  was stopped after 09/24/19.  --For convenience she was switched to oral chemo Xeloda  2 weeks on/1 week off and Bev every 3 weeks in 01/21/20. She is tolerating well -Her restaging CT scan from 03/11/2023 showed stable liver metastasis, no other new lesions.  -CT 09/29/2023 showed mild disease progression in liver  -treatment changed to FOLFIRI and beva on 10/27/2023

## 2024-03-27 NOTE — Progress Notes (Signed)
 " [facility]   [phone] Fax:([phone]   Clinic Follow up Note   Patient Care Team: [name], [name], MD as PCP - General (Internal Medicine) [name], MD as PCP - Cardiology (Cardiology) [name], MD as Consulting Physician (General Surgery) [name], MD as Consulting Physician (Hematology) [name], [name], NP as Nurse Practitioner (Nurse Practitioner) 03/27/2024  I connected with Casey Black on 03/27/2024 at 10:20 AM EST by telephone and verified that I am speaking with the correct person using two identifiers.   I discussed the limitations, risks, security and privacy concerns of performing an evaluation and management service by telephone and the availability of in person appointments. I also discussed with the patient that there may be a patient responsible charge related to this service. The patient expressed understanding and agreed to proceed.   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  -Started first line chemo on 03/12/2019, received 5FU/leuc with first 2 cycles for large open abdominal wound after surgery. She started full dose FOLFOX and avastin  with cycle 2. Due to neuropathy Oxaliplatin  was stopped after 09/24/19.  --For convenience she was switched to oral chemo Xeloda  2 weeks on/1 week off and Bev every 3 weeks in 01/21/20. She is tolerating well -Her restaging CT scan from 03/11/2023 showed stable liver metastasis, no other new lesions.  -CT 09/29/2023 showed mild disease progression in liver  -treatment changed to FOLFIRI and beva on  10/27/2023  Assessment & Plan Metastatic colon adenocarcinoma with liver and mediastinal lymph node metastases She has persistent metastatic colon adenocarcinoma involving the liver and mediastinal lymph nodes, as confirmed by recent PET scan. Extrahepatic disease precludes liver-directed therapy. She remains clinically well and continues to tolerate systemic therapy, with a disease course exceeding typical expectations. - Reviewed PET scan. - Discussed that liver-directed therapy is not recommended due to extrahepatic disease. - Recommended changing chemo to FOLFOX chemotherapy with possible oxaliplatin  dose reduction due to neuropathy. - Planned to initiate chemotherapy the week of January 5th. - Scheduled chemotherapy for Thursday afternoons to accommodate her work schedule. - Arranged laboratory evaluation and treatment during the first week, with follow-up visit during the second treatment week.  Chemotherapy-induced peripheral neuropathy She experiences mild chemotherapy-induced peripheral neuropathy in her hands and feet, not significantly impacting daily activities. Neuropathy previously necessitated discontinuation of oxaliplatin . - Discussed risk of exacerbation of neuropathy with resumption of oxaliplatin . - Recommended use of ice packs on hands and feet during chemotherapy infusions to mitigate neuropathy. - Advised bringing ice packs to infusion appointments for nurse assistance.  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 Bowel-gas pattern consistent with small bowel obstruction. No free air. 2. No acute chest findings.   01/23/2019 Imaging   CT AP IMPRESSION: Obstructing mid transverse colonic mass with mild regional adenopathy and hepatic metastatic disease. The mass likely extends through the serosa; no ascites or peritoneal nodularity.   01/24/2019 Surgery   Surgeon: Mitzie DELENA Freund MD Assistant: Harlene Ferraris PA-C Procedure performed: Transverse colectomy with end colostomy, liver biopsy Procedure classification: URGENT/EMERGENT Preop diagnosis: Obstructing, metastatic transverse colon mass Post-op diagnosis/intraop findings: Same   01/24/2019 Pathology Results   FINAL MICROSCOPIC DIAGNOSIS:   A. COLON, TRANSVERSE, RESECTION:  Colonic adenocarcinoma, 5 cm.  Carcinoma extends into pericolonic connective tissue and focally to  serosal surface.  Margins not involved.  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   B. LIVER NODULE, LEFT, BIOPSY:  Metastatic adenocarcinoma.    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 Hypermetabolic metastatic disease in the liver and mediastinal/hilar/axillary lymph nodes. 2. Focal hypermetabolism in the rectum. Continued attention on follow-up exams is warranted. 3. Focal hypermetabolism medial to the right adrenal gland may be within a metastatic lymph node, better visualized on 01/23/2019. 4. Aortic atherosclerosis (ICD10-170.0). Coronary artery calcification.   03/12/2019 -  Chemotherapy   She started 5FU q2weeks on 03/12/19 for 2 cycles. She started full dose FOLFOX  with Avastin  on 04/09/19. Oxaliplatin  dose reduced repeatedly due to neuropathy C12 and held since C16 on 10/09/19. Now on maintenance Avastin  and 5FU q2weeks since 10/09/19       -Maintenance change to maintenance xeloda  2000 mg BID days 1-14 q21 days and q3 weeks Zirabev  (15 mg/kg) starting 01/21/20. First cycle was taken 1000mg  BID due to misunderstanding.    05/31/2019 Imaging   Restaging CT CAP IMPRESSION: 1. Similar to mild interval decrease in size of multiple hepatic lesions, partially calcified. 2. 2 mm right upper lobe pulmonary nodule. Recommend attention on follow-up. 3. Emphysema and aortic atherosclerosis.   08/23/2019 Imaging   CT CAP w contrast  IMPRESSION: 1. The dominant peripheral right liver metastasis has mildly increased. Other smaller liver metastases are stable. 2. Otherwise no new or progressive metastatic disease in the chest, abdomen or pelvis. 3. Aortic Atherosclerosis (ICD10-I70.0) and Emphysema (ICD10-J43.9).   11/27/2019 Imaging   CT CAP w contrast  IMPRESSION: Status post transverse colectomy with right mid abdominal colostomy.   Mildly progressive hepatic metastases, as above.   No evidence of metastatic disease in the chest. Small mediastinal lymph nodes are within normal limits.   Additional stable ancillary findings as above.   01/21/2020 - 10/06/2023 Chemotherapy   Patient is on Treatment Plan : COLORECTAL Bevacizumab  q21d     02/21/2020 Imaging   IMPRESSION: 1. Stable hepatic metastatic disease. 2. Aortic atherosclerosis (ICD10-I70.0). Coronary artery calcification. 3.  Emphysema (ICD10-J43.9).   05/19/2020 Imaging   CT CAP  IMPRESSION: 1. Multiple partially calcified liver metastases are again noted. With the exception of a small lesion in segment 7/8 lesions are not significantly changed in the interval. No new liver lesions identified. 2. Coronary artery atherosclerotic calcifications. 3. Aortic atherosclerosis. 4. 2 mm right upper lobe  lung nodule identified.  Unchanged.   Aortic Atherosclerosis (ICD10-I70.0).   11/17/2020 Imaging  CT CAP  IMPRESSION: 1. Partially calcified lesions throughout the liver are stable accounting for differences in technique, contrasted imaging on today's study is compared to noncontrast imaging on the prior. 2. No new hepatic lesions. 3. Tiny 3 mm pulmonary nodule in the RIGHT upper lobe unchanged since the prior study. Attention on follow-up. 4. Mild fullness of RIGHT paratracheal nodal tissue is minimally increased and borderline enlarged, attention on follow-up. 5. RIGHT lower quadrant colostomy. 6. 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 7. Aortic atherosclerosis.   06/09/2021 Imaging   EXAM: CT CHEST, ABDOMEN, AND PELVIS WITH CONTRAST  IMPRESSION: Stable calcified liver metastases.   Stable mildly enlarged right paratracheal lymph node.   No evidence of new or progressive metastatic disease.   Aortic Atherosclerosis (ICD10-I70.0).   09/14/2021 PET scan   IMPRESSION: Multiple hypermetabolic and partially calcified liver metastases, without significant change compared to most recent CT.   Mild hypermetabolic mediastinal and bilateral hilar lymphadenopathy, also without significant change.   No evidence of new or progressive metastatic disease.     Electronically Signed   By: Norleen DELENA Kil M.D.   On: 09/15/2021 11:04   04/15/2022 Imaging    IMPRESSION: 1. Several previously noted partially calcified hepatic metastases appear grossly stable compared to the prior examination. No new hepatic lesions or other signs of metastatic disease noted elsewhere in the chest, abdomen or pelvis. 2. Aortic atherosclerosis, in addition to left main and three-vessel coronary artery disease. Please note that although the presence of coronary artery calcium  documents  the presence of coronary artery disease, the severity of this disease and any potential stenosis cannot be assessed on this non-gated CT examination. Assessment for potential risk factor modification, dietary therapy or pharmacologic therapy may be warranted, if clinically indicated. 3. There are calcifications of the mitral annulus. Echocardiographic correlation for evaluation of potential valvular dysfunction may be warranted if clinically indicated. 4. Additional incidental findings, similar to prior studies, as above.   07/15/2022 Imaging    IMPRESSION: 1. Stable exam. No new or progressive findings in the abdomen or pelvis to suggest worsening disease. 2. No substantial change in calcified liver metastases. 3. Right abdominal transverse end colostomy with parastomal herniation of fat and colon proximal to the stoma. 4. Probable left-sided uterine fibroid measuring on the order of 3.1 cm and displacing the endometrial stripe to the right. Pelvic ultrasound could be used to further evaluate as clinically warranted. 5.  Aortic Atherosclerosis (ICD10-I70.0).     07/07/2023 Imaging   CT chest, abdomen, and pelvis IMPRESSION: Partially calcified treated hepatic metastatic disease remains stable, except for mild increase in size of a single small low-attenuation lesion in central left hepatic lobe suspicious for enlarging metastasis. Recommend correlation with tumor markers, and consider short-term follow-up by CT in 3-6 months.   No other sites of metastatic disease identified.   Stable uterine fibroid.     10/27/2023 - 03/22/2024 Chemotherapy   Patient is on Treatment Plan : COLORECTAL FOLFIRI + Bevacizumab  q14d     04/12/2024 -  Chemotherapy   Patient is on Treatment Plan : COLORECTAL FOLFOX + Bevacizumab  q14d       Discussed the use of AI scribe software for clinical note transcription with the patient, who gave verbal consent to proceed.  History of Present Illness Casey Black is a 63 year old female with metastatic colon cancer involving the liver and mediastinal  lymph nodes who presents for follow-up and ongoing management.  She is clinically stable without new cancer-related symptoms and continues to work without significant limitations, describing her overall condition as feeling pretty good.  A recent PET scan showed persistent mediastinal lymphadenopathy, unchanged from prior imaging, with no new disease sites.  She previously received FOLFOX, which was stopped in June 2021 due to chemotherapy-induced peripheral neuropathy after a good response, and she remains on systemic therapy with her most recent chemotherapy on March 22, 2024.  She has stable mild numbness and tingling in her hands and feet from peripheral neuropathy without progression or functional limitation.     REVIEW OF SYSTEMS:   Constitutional: Denies fevers, chills or abnormal weight loss Eyes: Denies blurriness of vision Ears, nose, mouth, throat, and face: Denies mucositis or sore throat Respiratory: Denies cough, dyspnea or wheezes Cardiovascular: Denies palpitation, chest discomfort or lower extremity swelling Gastrointestinal:  Denies nausea, heartburn or change in bowel habits Skin: Denies abnormal skin rashes Lymphatics: Denies new lymphadenopathy or easy bruising Neurological:Denies numbness, tingling or new weaknesses Behavioral/Psych: Mood is stable, no new changes  All other systems were reviewed with the patient and are negative.  MEDICAL HISTORY:  Past Medical History:  Diagnosis Date   Anemia    low iron   Colon cancer (HCC) 01/2019   Hypertension 01/23/2019   Personal history of chemotherapy 01/2019   colon CA   SBO (small bowel obstruction) (HCC) 01/23/2019    SURGICAL HISTORY: Past Surgical History:  Procedure Laterality Date   CESAREAN SECTION     x2   COLOSTOMY N/A 01/24/2019   Procedure: End Loop Colostomy;  Surgeon: [name], MD;   Location: [facility] OR;  Service: General;  Laterality: N/A;   IR RADIOLOGIST EVAL & MGMT  03/23/2024   PARTIAL COLECTOMY N/A 01/24/2019   Procedure: PARTIAL COLECTOMY;  Surgeon: [name], MD;  Location: [facility] OR;  Service: General;  Laterality: N/A;   PORTACATH PLACEMENT Right 02/28/2019   Procedure: INSERTION PORT-A-CATH WITH ULTRASOUND GUIDANCE;  Surgeon: [name], MD;  Location: [facility] OR;  Service: General;  Laterality: Right;    I have reviewed the social history and family history with the patient and they are unchanged from previous note.  ALLERGIES:  has no known allergies.  MEDICATIONS:  Current Outpatient Medications  Medication Sig Dispense Refill   acetaminophen  (TYLENOL ) 325 MG tablet Take 2 tablets (650 mg total) by mouth every 6 (six) hours as needed. (Patient taking differently: Take 650 mg by mouth every 6 (six) hours as needed for mild pain (pain score 1-3) (or headaches).)     amLODipine  (NORVASC ) 10 MG tablet TAKE 1 TABLET BY MOUTH EVERY DAY 90 tablet 1   [Paused] aspirin  EC 81 MG tablet Take 1 tablet (81 mg total) by mouth daily. Swallow whole. 30 tablet 12   ferrous sulfate  325 (65 FE) MG tablet TAKE 1 TABLET BY MOUTH TWICE A DAY WITH FOOD 180 tablet 1   gabapentin  (NEURONTIN ) 300 MG capsule TAKE 1 CAPSULE BY MOUTH TWICE A DAY 60 capsule 3   hydrALAZINE  (APRESOLINE ) 25 MG tablet TAKE 1 TABLET BY MOUTH EVERY 8 HOURS (Patient taking differently: Take 25 mg by mouth in the morning and at bedtime.) 270 tablet 0   hydrochlorothiazide  (HYDRODIURIL ) 12.5 MG tablet TAKE 1 TABLET BY MOUTH EVERY DAY (Patient taking differently: Take 12.5 mg by mouth at bedtime.) 90 tablet 1   isosorbide  mononitrate (IMDUR ) 30 MG 24 hr tablet TAKE 1 TABLET  BY MOUTH EVERY DAY 90 tablet 1   levothyroxine  (SYNTHROID ) 25 MCG tablet Take 25 mcg by mouth daily before breakfast.     lidocaine -prilocaine  (EMLA ) cream APPLY 1 APPLICATION TOPICALLY AS NEEDED. 30 g 1   Vitamin D , Ergocalciferol ,  (DRISDOL ) 1.25 MG (50000 UNIT) CAPS capsule Take 1 capsule (50,000 Units total) by mouth every 7 (seven) days for 12 doses. (Patient taking differently: Take 50,000 Units by mouth every Friday.) 12 capsule 0   No current facility-administered medications for this visit.   Facility-Administered Medications Ordered in Other Visits  Medication Dose Route Frequency Provider Last Rate Last Admin   sodium chloride  flush (NS) 0.9 % injection 10 mL  10 mL Intracatheter PRN [name], MD   10 mL at 06/06/20 1051    PHYSICAL EXAMINATION: Not performed   LABORATORY DATA:  I have reviewed the data as listed    Latest Ref Rng & Units 03/22/2024    7:49 AM 03/08/2024   10:51 AM 03/05/2024    8:47 PM  CBC  WBC 4.0 - 10.5 K/uL 3.4  6.7    Hemoglobin 12.0 - 15.0 g/dL 89.4  88.8  87.7   Hematocrit 36.0 - 46.0 % 32.6  34.1  36.0   Platelets 150 - 400 K/uL 166  254          Latest Ref Rng & Units 03/22/2024    7:49 AM 03/08/2024   10:51 AM 03/05/2024    8:47 PM  CMP  Glucose 70 - 99 mg/dL 93  897  90   BUN 8 - 23 mg/dL 15  16  16    Creatinine 0.44 - 1.00 mg/dL 9.10  8.96  9.09   Sodium 135 - 145 mmol/L 137  135  136   Potassium 3.5 - 5.1 mmol/L 4.3  4.5  3.8   Chloride 98 - 111 mmol/L 103  101  102   CO2 22 - 32 mmol/L 24  26    Calcium  8.9 - 10.3 mg/dL 89.8  89.9    Total Protein 6.5 - 8.1 g/dL 7.2  8.0    Total Bilirubin 0.0 - 1.2 mg/dL 0.5  0.7    Alkaline Phos 38 - 126 U/L 119  119    AST 15 - 41 U/L 26  31    ALT 0 - 44 U/L 15  21        RADIOGRAPHIC STUDIES: I have personally reviewed the radiological images as listed and agreed with the findings in the report. NM PET Image Restag (PS) Skull Base To Thigh Result Date: 03/27/2024 EXAM: PET AND CT SKULL BASE TO MID THIGH 03/26/2024 08:55:54 AM TECHNIQUE: RADIOPHARMACEUTICAL: 11.42 mCi F-18 FDG Uptake time 60 minutes. Glucose level 100 mg/dl. Blood pool SUV 2.8. PET imaging was acquired from the base of the skull to the mid thighs.  Non-contrast enhanced computed tomography was obtained for attenuation correction and anatomic localization. COMPARISON: 09/14/2021 and various CT examinations including 03/05/2024. CLINICAL HISTORY: Colon cancer, assess treatment response. FINDINGS: HEAD AND NECK: No metabolically active cervical lymphadenopathy. CHEST: Small hypermetabolic left paratracheal lymph node with maximum SUV 5.2. Small hypermetabolic right hilar lymph node with maximum SUV 4.8. Small hypermetabolic left infrahilar lymph node with maximum SUV 7.1, previously 7.9. Small hypermetabolic right subcarinal lymph node with maximum SUV 9.7, formerly 5.8. Port-a-cath tip in SVC. Thoracic aortic, coronary artery, and branch vessel atheromatous vascular calcifications. Mild cardiomegaly. Systemic atherosclerosis is present, including the aorta and iliac arteries. ABDOMEN AND PELVIS: Scattered hepatic  metastatic lesions increased from prior PET CT; similar distribution to the CT examination from 03/05/2024. All lobes are affected. A hypermetabolic segment 4 lesion measuring 3.6 cm in diameter has maximum SUV of 20.3. Portocaval lymph node measuring about 1.6 cm in short axis has maximum SUV of 12.6, compatible with malignant involvement. Paracentral accentuated anal activity is probably physiologic, maximum SUV 7.6. Right colostomy. Suspected uterine fibroids. Mild prominence of stool in the rectal vault. Rectus diastasis. Physiologic activity within the gastrointestinal and genitourinary systems. BONES AND SOFT TISSUE: No abnormal FDG activity localizes to the bones. No metabolically active aggressive osseous lesion. IMPRESSION: 1. Interval increase in hypermetabolic hepatic metastatic disease since prior PET-CT, involving all lobes, including a hypermetabolic segment 4 lesion measuring 3.6 cm with maximum SUV 20.3. 2. Hypermetabolic thoracic nodal metastatic disease involving left paratracheal, right hilar, left infrahilar, and right subcarinal  lymph nodes. 3. Hypermetabolic portocaval nodal metastatic disease, with a 1.6 cm short-axis node with maximum SUV 12.6. 4. Paracentral anal activity favored physiologic, with maximum SUV 7.6 5. Additional chronic and incidental findings including mild cardiomegaly, systemic atherosclerosis, port-a-cath, right colostomy, rectus diastasis, suspected uterine fibroids, and mild rectal stool burden. Electronically signed by: Ryan Salvage MD 03/27/2024 09:03 AM EST RP Workstation: [id number]       I discussed the assessment and treatment plan with the patient. The patient was provided an opportunity to ask questions and all were answered. The patient agreed with the plan and demonstrated an understanding of the instructions.   The patient was advised to call back or seek an in-person evaluation if the symptoms worsen or if the condition fails to improve as anticipated.  I provided 40 minutes of non face-to-face telephone visit time during this encounter, including review of chart and various tests results, discussions about plan of care and coordination of care plan.    Onita Mattock, MD 03/27/2024     "

## 2024-03-28 ENCOUNTER — Encounter: Payer: Self-pay | Admitting: Hematology

## 2024-04-03 ENCOUNTER — Other Ambulatory Visit: Payer: Self-pay | Admitting: Internal Medicine

## 2024-04-03 DIAGNOSIS — E039 Hypothyroidism, unspecified: Secondary | ICD-10-CM

## 2024-04-09 DIAGNOSIS — I10 Essential (primary) hypertension: Secondary | ICD-10-CM

## 2024-04-10 ENCOUNTER — Emergency Department (HOSPITAL_COMMUNITY)
Admission: EM | Admit: 2024-04-10 | Discharge: 2024-04-11 | Disposition: A | Discharge status: Home / Self Care | Attending: Emergency Medicine | Admitting: Emergency Medicine

## 2024-04-10 ENCOUNTER — Other Ambulatory Visit: Payer: Self-pay

## 2024-04-10 DIAGNOSIS — Z87891 Personal history of nicotine dependence: Secondary | ICD-10-CM | POA: Diagnosis not present

## 2024-04-10 DIAGNOSIS — I10 Essential (primary) hypertension: Secondary | ICD-10-CM | POA: Diagnosis not present

## 2024-04-10 DIAGNOSIS — Z85038 Personal history of other malignant neoplasm of large intestine: Secondary | ICD-10-CM | POA: Diagnosis not present

## 2024-04-10 DIAGNOSIS — R109 Unspecified abdominal pain: Secondary | ICD-10-CM

## 2024-04-10 DIAGNOSIS — R112 Nausea with vomiting, unspecified: Secondary | ICD-10-CM | POA: Insufficient documentation

## 2024-04-10 DIAGNOSIS — R1011 Right upper quadrant pain: Secondary | ICD-10-CM | POA: Diagnosis present

## 2024-04-10 NOTE — ED Triage Notes (Signed)
 Patient c/o RUQ abdominal pain x 2 days . Patient report worsening pain around her colostomy bag tonight. Patient report nausea and vomiting x 3 yesterday. Hx Colon Ca. Last chemo done 03/27/24

## 2024-04-10 NOTE — ED Notes (Signed)
 Patient request port to be access for lab work

## 2024-04-11 ENCOUNTER — Encounter (HOSPITAL_COMMUNITY): Payer: Self-pay

## 2024-04-11 ENCOUNTER — Emergency Department (HOSPITAL_COMMUNITY)

## 2024-04-11 LAB — COMPREHENSIVE METABOLIC PANEL WITH GFR
ALT: 34 U/L (ref 0–44)
AST: 50 U/L — ABNORMAL HIGH (ref 15–41)
Albumin: 4.1 g/dL (ref 3.5–5.0)
Alkaline Phosphatase: 171 U/L — ABNORMAL HIGH (ref 38–126)
Anion gap: 14 (ref 5–15)
BUN: 20 mg/dL (ref 8–23)
CO2: 22 mmol/L (ref 22–32)
Calcium: 10.4 mg/dL — ABNORMAL HIGH (ref 8.9–10.3)
Chloride: 98 mmol/L (ref 98–111)
Creatinine, Ser: 1.15 mg/dL — ABNORMAL HIGH (ref 0.44–1.00)
GFR, Estimated: 53 mL/min — ABNORMAL LOW
Glucose, Bld: 94 mg/dL (ref 70–99)
Potassium: 4.3 mmol/L (ref 3.5–5.1)
Sodium: 135 mmol/L (ref 135–145)
Total Bilirubin: 1.2 mg/dL (ref 0.0–1.2)
Total Protein: 8.2 g/dL — ABNORMAL HIGH (ref 6.5–8.1)

## 2024-04-11 LAB — CBC
HCT: 35.8 % — ABNORMAL LOW (ref 36.0–46.0)
Hemoglobin: 11.3 g/dL — ABNORMAL LOW (ref 12.0–15.0)
MCH: 29.9 pg (ref 26.0–34.0)
MCHC: 31.6 g/dL (ref 30.0–36.0)
MCV: 94.7 fL (ref 80.0–100.0)
Platelets: 226 K/uL (ref 150–400)
RBC: 3.78 MIL/uL — ABNORMAL LOW (ref 3.87–5.11)
RDW: 15.9 % — ABNORMAL HIGH (ref 11.5–15.5)
WBC: 7.5 K/uL (ref 4.0–10.5)
nRBC: 0 % (ref 0.0–0.2)

## 2024-04-11 LAB — LIPASE, BLOOD: Lipase: <10 U/L — ABNORMAL LOW (ref 11–51)

## 2024-04-11 MED ORDER — MORPHINE SULFATE (PF) 4 MG/ML IV SOLN
4.0000 mg | Freq: Once | INTRAVENOUS | Status: AC
  Administered 2024-04-11: 4 mg via INTRAVENOUS
  Filled 2024-04-11: qty 1

## 2024-04-11 MED ORDER — ONDANSETRON HCL 4 MG/2ML IJ SOLN
4.0000 mg | Freq: Once | INTRAMUSCULAR | Status: AC
  Administered 2024-04-11: 4 mg via INTRAVENOUS
  Filled 2024-04-11: qty 2

## 2024-04-11 MED ORDER — HEPARIN SOD (PORK) LOCK FLUSH 100 UNIT/ML IV SOLN
500.0000 [IU] | Freq: Once | INTRAVENOUS | Status: AC
  Administered 2024-04-11: 500 [IU]
  Filled 2024-04-11: qty 5

## 2024-04-11 MED ORDER — SODIUM CHLORIDE 0.9 % IV BOLUS
1000.0000 mL | Freq: Once | INTRAVENOUS | Status: AC
  Administered 2024-04-11: 1000 mL via INTRAVENOUS

## 2024-04-11 MED ORDER — IOHEXOL 300 MG/ML  SOLN
100.0000 mL | Freq: Once | INTRAMUSCULAR | Status: AC | PRN
  Administered 2024-04-11: 100 mL via INTRAVENOUS

## 2024-04-11 NOTE — Discharge Instructions (Signed)
 You were evaluated in the Emergency Department and after careful evaluation, we did not find any emergent condition requiring admission or further testing in the hospital.  Your exam/testing today is overall reassuring.  Keep your follow-up with your regular doctors.  Please return to the Emergency Department if you experience any worsening of your condition.   Thank you for allowing us  to be a part of your care.

## 2024-04-11 NOTE — ED Provider Notes (Signed)
 " [facility] DEPT [facility] Emergency Department Provider Note MRN:  [id number]  Arrival date & time: 04/11/2024     Chief Complaint   Abdominal Pain   History of Present Illness   Casey Black is a 64 y.o. year-old female with a history of colon cancer presenting to the ED with chief complaint of abdominal pain.  Worsening abdominal pain over the past few days.  Pain mostly underneath her colostomy but also generalized.  Had some nausea vomiting yesterday.  Denies fever.  Currently on chemotherapy every other week.  Review of Systems  A thorough review of systems was obtained and all systems are negative except as noted in the HPI and PMH.   Patient's Health History    Past Medical History:  Diagnosis Date   Anemia    low iron   Colon cancer (HCC) 01/2019   Hypertension 01/23/2019   Personal history of chemotherapy 01/2019   colon CA   SBO (small bowel obstruction) (HCC) 01/23/2019    Past Surgical History:  Procedure Laterality Date   CESAREAN SECTION     x2   COLOSTOMY N/A 01/24/2019   Procedure: End Loop Colostomy;  Surgeon: [name], MD;  Location: [facility] OR;  Service: General;  Laterality: N/A;   IR RADIOLOGIST EVAL & MGMT  03/23/2024   PARTIAL COLECTOMY N/A 01/24/2019   Procedure: PARTIAL COLECTOMY;  Surgeon: [name], MD;  Location: [facility] OR;  Service: General;  Laterality: N/A;   PORTACATH PLACEMENT Right 02/28/2019   Procedure: INSERTION PORT-A-CATH WITH ULTRASOUND GUIDANCE;  Surgeon: [name], MD;  Location: [facility] OR;  Service: General;  Laterality: Right;    Family History  Problem Relation Age of Onset   Heart murmur Mother    Kidney failure Father    Hypertension Father    Hypertension Sister     Social History   Socioeconomic History   Marital status: Single    Spouse name: Not on file   Number of children: 2   Years of education: Not on file   Highest education level: Not on file  Occupational History   Not on file   Tobacco Use   Smoking status: Former    Current packs/day: 0.00    Average packs/day: 0.5 packs/day for 29.0 years (14.5 ttl pk-yrs)    Types: Cigarettes    Start date: 01/03/1990    Quit date: 01/04/2019    Years since quitting: 5.2   Smokeless tobacco: Never   Tobacco comments:    desires patch  Vaping Use   Vaping status: Never Used  Substance and Sexual Activity   Alcohol use: Not Currently    Comment: a pint a week   Drug use: Never   Sexual activity: Not on file  Other Topics Concern   Not on file  Social History Narrative   Lives with daughter.  Two children.  One grand.     Social Drivers of Health   Tobacco Use: Medium Risk (03/05/2024)   Patient History    Smoking Tobacco Use: Former    Smokeless Tobacco Use: Never    Passive Exposure: Not on file  Financial Resource Strain: Low Risk (02/01/2023)   Overall Financial Resource Strain (CARDIA)    Difficulty of Paying Living Expenses: Not hard at all  Food Insecurity: No Food Insecurity (02/24/2024)   Epic    Worried About Radiation Protection Practitioner of Food in the Last Year: Never true    Ran Out of Food in the Last  Year: Never true  Transportation Needs: No Transportation Needs (02/24/2024)   Epic    Lack of Transportation (Medical): No    Lack of Transportation (Non-Medical): No  Physical Activity: Insufficiently Active (02/24/2024)   Exercise Vital Sign    Days of Exercise per Week: 2 days    Minutes of Exercise per Session: 20 min  Stress: No Stress Concern Present (02/24/2024)   [employer] of Occupational Health - Occupational Stress Questionnaire    Feeling of Stress: Not at all  Social Connections: Moderately Integrated (02/24/2024)   Social Connection and Isolation Panel    Frequency of Communication with Friends and Family: More than three times a week    Frequency of Social Gatherings with Friends and Family: More than three times a week    Attends Religious Services: More than 4 times per year    Active  Member of Clubs or Organizations: Yes    Attends Banker Meetings: More than 4 times per year    Marital Status: Never married  Intimate Partner Violence: Not At Risk (02/24/2024)   Epic    Fear of Current or Ex-Partner: No    Emotionally Abused: No    Physically Abused: No    Sexually Abused: No  Depression (PHQ2-9): Low Risk (03/22/2024)   Depression (PHQ2-9)    PHQ-2 Score: 0  Alcohol Screen: Low Risk (02/01/2023)   Alcohol Screen    Last Alcohol Screening Score (AUDIT): 0  Housing: Unknown (02/24/2024)   Epic    Unable to Pay for Housing in the Last Year: No    Number of Times Moved in the Last Year: Not on file    Homeless in the Last Year: No  Utilities: Not At Risk (02/24/2024)   Epic    Threatened with loss of utilities: No  Health Literacy: Adequate Health Literacy (02/24/2024)   B1300 Health Literacy    Frequency of need for help with medical instructions: Never     Physical Exam   Vitals:   04/10/24 1851 04/10/24 2337  BP: (!) 161/88 (!) 176/83  Pulse: 94 73  Resp: 16 16  Temp: 99.1 F (37.3 C) 98.4 F (36.9 C)  SpO2: 100% 98%    CONSTITUTIONAL: Well-appearing, NAD NEURO/PSYCH:  Alert and oriented x 3, no focal deficits EYES:  eyes equal and reactive ENT/NECK:  no LAD, no JVD CARDIO: Regular rate, well-perfused, normal S1 and S2 PULM:  CTAB no wheezing or rhonchi GI/GU:  non-distended, non-tender MSK/SPINE:  No gross deformities, no edema SKIN:  no rash, atraumatic   *Additional and/or pertinent findings included in MDM below  Diagnostic and Interventional Summary    EKG Interpretation Date/Time:    Ventricular Rate:    PR Interval:    QRS Duration:    QT Interval:    QTC Calculation:   R Axis:      Text Interpretation:         Labs Reviewed  LIPASE, BLOOD - Abnormal; Notable for the following components:      Result Value   Lipase <10 (*)    All other components within normal limits  COMPREHENSIVE METABOLIC PANEL WITH  GFR - Abnormal; Notable for the following components:   Creatinine, Ser 1.15 (*)    Calcium  10.4 (*)    Total Protein 8.2 (*)    AST 50 (*)    Alkaline Phosphatase 171 (*)    GFR, Estimated 53 (*)    All other components within normal limits  CBC -  Abnormal; Notable for the following components:   RBC 3.78 (*)    Hemoglobin 11.3 (*)    HCT 35.8 (*)    RDW 15.9 (*)    All other components within normal limits    CT ABDOMEN PELVIS W CONTRAST  Final Result      Medications  heparin  lock flush 100 unit/mL (has no administration in time range)  sodium chloride  0.9 % bolus 1,000 mL (0 mLs Intravenous Stopped 04/11/24 0239)  morphine  (PF) 4 MG/ML injection 4 mg (4 mg Intravenous Given 04/11/24 0126)  ondansetron  (ZOFRAN ) injection 4 mg (4 mg Intravenous Given 04/11/24 0126)  iohexol  (OMNIPAQUE ) 300 MG/ML solution 100 mL (100 mLs Intravenous Contrast Given 04/11/24 0214)     Procedures  /  Critical Care Procedures  ED Course and Medical Decision Making  Initial Impression and Ddx Differential diagnosis includes complication of known colon cancer, SBO, colostomy related hernia, anastomotic leak.  Plan is for CT imaging.  Past medical/surgical history that increases complexity of ED encounter: Colon cancer  Interpretation of Diagnostics I personally reviewed the Laboratory Testing and my interpretation is as follows: No significant blood count or electrolyte disturbance.  CT without acute process  Patient Reassessment and Ultimate Disposition/Management     Patient is feeling better.  No dysuria to suggest UTI.  Plan is for discharge, has follow-up with oncology in a few days.  Patient management required discussion with the following services or consulting groups:  None  Complexity of Problems Addressed Acute illness or injury that poses threat of life of bodily function  Additional Data Reviewed and Analyzed Further history obtained from: Further history from spouse/family  member  Additional Factors Impacting ED Encounter Risk Consideration of hospitalization  Ozell HERO. Theadore, MD [facility] Emergency Medicine [facility] [email or web address]  Final Clinical Impressions(s) / ED Diagnoses     ICD-10-CM   1. Abdominal pain, unspecified abdominal location  R10.9       ED Discharge Orders     None        Discharge Instructions Discussed with and Provided to Patient:     Discharge Instructions      You were evaluated in the Emergency Department and after careful evaluation, we did not find any emergent condition requiring admission or further testing in the hospital.  Your exam/testing today is overall reassuring.  Keep your follow-up with your regular doctors.  Please return to the Emergency Department if you experience any worsening of your condition.   Thank you for allowing us  to be a part of your care.       Theadore Ozell HERO, MD 04/11/24 0401  "

## 2024-04-12 ENCOUNTER — Other Ambulatory Visit: Payer: Self-pay | Admitting: *Deleted

## 2024-04-12 ENCOUNTER — Other Ambulatory Visit: Payer: Self-pay

## 2024-04-12 ENCOUNTER — Other Ambulatory Visit: Payer: Self-pay | Admitting: Hematology and Oncology

## 2024-04-12 ENCOUNTER — Inpatient Hospital Stay: Attending: Hematology

## 2024-04-12 VITALS — BP 134/61 | HR 72 | Temp 98.5°F | Resp 16 | Wt 222.8 lb

## 2024-04-12 DIAGNOSIS — C189 Malignant neoplasm of colon, unspecified: Secondary | ICD-10-CM

## 2024-04-12 DIAGNOSIS — C184 Malignant neoplasm of transverse colon: Secondary | ICD-10-CM | POA: Insufficient documentation

## 2024-04-12 DIAGNOSIS — Z5111 Encounter for antineoplastic chemotherapy: Secondary | ICD-10-CM | POA: Insufficient documentation

## 2024-04-12 DIAGNOSIS — C787 Secondary malignant neoplasm of liver and intrahepatic bile duct: Secondary | ICD-10-CM | POA: Diagnosis present

## 2024-04-12 DIAGNOSIS — D649 Anemia, unspecified: Secondary | ICD-10-CM

## 2024-04-12 LAB — CBC WITH DIFFERENTIAL (CANCER CENTER ONLY)
Abs Immature Granulocytes: 0.03 K/uL (ref 0.00–0.07)
Basophils Absolute: 0.1 K/uL (ref 0.0–0.1)
Basophils Relative: 1 %
Eosinophils Absolute: 0.1 K/uL (ref 0.0–0.5)
Eosinophils Relative: 2 %
HCT: 32.6 % — ABNORMAL LOW (ref 36.0–46.0)
Hemoglobin: 10.5 g/dL — ABNORMAL LOW (ref 12.0–15.0)
Immature Granulocytes: 0 %
Lymphocytes Relative: 25 %
Lymphs Abs: 1.9 K/uL (ref 0.7–4.0)
MCH: 29.5 pg (ref 26.0–34.0)
MCHC: 32.2 g/dL (ref 30.0–36.0)
MCV: 91.6 fL (ref 80.0–100.0)
Monocytes Absolute: 1.1 K/uL — ABNORMAL HIGH (ref 0.1–1.0)
Monocytes Relative: 15 %
Neutro Abs: 4.3 K/uL (ref 1.7–7.7)
Neutrophils Relative %: 57 %
Platelet Count: 233 K/uL (ref 150–400)
RBC: 3.56 MIL/uL — ABNORMAL LOW (ref 3.87–5.11)
RDW: 15.7 % — ABNORMAL HIGH (ref 11.5–15.5)
WBC Count: 7.5 K/uL (ref 4.0–10.5)
nRBC: 0 % (ref 0.0–0.2)

## 2024-04-12 LAB — CMP (CANCER CENTER ONLY)
ALT: 27 U/L (ref 0–44)
AST: 37 U/L (ref 15–41)
Albumin: 4.1 g/dL (ref 3.5–5.0)
Alkaline Phosphatase: 167 U/L — ABNORMAL HIGH (ref 38–126)
Anion gap: 13 (ref 5–15)
BUN: 25 mg/dL — ABNORMAL HIGH (ref 8–23)
CO2: 23 mmol/L (ref 22–32)
Calcium: 10.1 mg/dL (ref 8.9–10.3)
Chloride: 96 mmol/L — ABNORMAL LOW (ref 98–111)
Creatinine: 1.97 mg/dL — ABNORMAL HIGH (ref 0.44–1.00)
GFR, Estimated: 28 mL/min — ABNORMAL LOW
Glucose, Bld: 87 mg/dL (ref 70–99)
Potassium: 4.3 mmol/L (ref 3.5–5.1)
Sodium: 131 mmol/L — ABNORMAL LOW (ref 135–145)
Total Bilirubin: 0.9 mg/dL (ref 0.0–1.2)
Total Protein: 8.3 g/dL — ABNORMAL HIGH (ref 6.5–8.1)

## 2024-04-12 LAB — CEA (ACCESS): CEA (CHCC): 622.86 ng/mL — ABNORMAL HIGH (ref 0.00–5.00)

## 2024-04-12 LAB — TOTAL PROTEIN, URINE DIPSTICK: Protein, ur: 100 mg/dL — AB

## 2024-04-12 MED ORDER — LEUCOVORIN CALCIUM INJECTION 350 MG
400.0000 mg/m2 | Freq: Once | INTRAVENOUS | Status: AC
  Administered 2024-04-12: 892 mg via INTRAVENOUS
  Filled 2024-04-12: qty 44.6

## 2024-04-12 MED ORDER — DEXAMETHASONE SOD PHOSPHATE PF 10 MG/ML IJ SOLN
10.0000 mg | Freq: Once | INTRAMUSCULAR | Status: AC
  Administered 2024-04-12: 10 mg via INTRAVENOUS

## 2024-04-12 MED ORDER — OXALIPLATIN CHEMO INJECTION 100 MG/20ML
70.0000 mg/m2 | Freq: Once | INTRAVENOUS | Status: AC
  Administered 2024-04-12: 150 mg via INTRAVENOUS
  Filled 2024-04-12: qty 10

## 2024-04-12 MED ORDER — FAMOTIDINE IN NACL 20-0.9 MG/50ML-% IV SOLN
20.0000 mg | Freq: Once | INTRAVENOUS | Status: AC
  Administered 2024-04-12: 20 mg via INTRAVENOUS
  Filled 2024-04-12: qty 50

## 2024-04-12 MED ORDER — PALONOSETRON HCL INJECTION 0.25 MG/5ML
0.2500 mg | Freq: Once | INTRAVENOUS | Status: AC
  Administered 2024-04-12: 0.25 mg via INTRAVENOUS
  Filled 2024-04-12: qty 5

## 2024-04-12 MED ORDER — DEXTROSE 5 % IV SOLN: INTRAVENOUS | Status: DC

## 2024-04-12 MED ORDER — SODIUM CHLORIDE 0.9 % IV SOLN
2000.0000 mg/m2 | INTRAVENOUS | Status: DC
  Administered 2024-04-12: 4450 mg via INTRAVENOUS
  Filled 2024-04-12: qty 89

## 2024-04-12 MED ORDER — DIPHENHYDRAMINE HCL 25 MG PO CAPS
25.0000 mg | ORAL_CAPSULE | Freq: Once | ORAL | Status: AC
  Administered 2024-04-12: 25 mg via ORAL
  Filled 2024-04-12: qty 1

## 2024-04-12 MED ORDER — SODIUM CHLORIDE 0.9 % IV SOLN: INTRAVENOUS | Status: DC

## 2024-04-12 MED ORDER — SODIUM CHLORIDE 0.9 % IV SOLN: 5.0000 mg/kg | Freq: Once | INTRAVENOUS | Status: DC

## 2024-04-12 NOTE — Patient Instructions (Signed)
 [facility] - A DEPT OF [facility]  Discharge Instructions: Thank you for choosing [facility] to provide your oncology and hematology care.   If you have a lab appointment with the Cancer Center, please go directly to the Cancer Center and check in at the registration area.   Wear comfortable clothing and clothing appropriate for easy access to any Portacath or PICC line.   We strive to give you quality time with your provider. You may need to reschedule your appointment if you arrive late (15 or more minutes).  Arriving late affects you and other patients whose appointments are after yours.  Also, if you miss three or more appointments without notifying the office, you may be dismissed from the clinic at the provider's discretion.      For prescription refill requests, have your pharmacy contact our office and allow 72 hours for refills to be completed.    Today you received the following chemotherapy and/or immunotherapy agents :  FOLFOX      To help prevent nausea and vomiting after your treatment, we encourage you to take your nausea medication as directed.  BELOW ARE SYMPTOMS THAT SHOULD BE REPORTED IMMEDIATELY: *FEVER GREATER THAN 100.4 F (38 C) OR HIGHER *CHILLS OR SWEATING *NAUSEA AND VOMITING THAT IS NOT CONTROLLED WITH YOUR NAUSEA MEDICATION *UNUSUAL SHORTNESS OF BREATH *UNUSUAL BRUISING OR BLEEDING *URINARY PROBLEMS (pain or burning when urinating, or frequent urination) *BOWEL PROBLEMS (unusual diarrhea, constipation, pain near the anus) TENDERNESS IN MOUTH AND THROAT WITH OR WITHOUT PRESENCE OF ULCERS (sore throat, sores in mouth, or a toothache) UNUSUAL RASH, SWELLING OR PAIN  UNUSUAL VAGINAL DISCHARGE OR ITCHING   Items with * indicate a potential emergency and should be followed up as soon as possible or go to the Emergency Department if any problems should occur.  Please show the CHEMOTHERAPY ALERT CARD or IMMUNOTHERAPY  ALERT CARD at check-in to the Emergency Department and triage nurse.  Should you have questions after your visit or need to cancel or reschedule your appointment, please contact [facility] - A DEPT OF [facility]  Dept: [phone]  and follow the prompts.  Office hours are 8:00 a.m. to 4:30 p.m. Monday - Friday. Please note that voicemails left after 4:00 p.m. may not be returned until the following business day.  We are closed weekends and major holidays. You have access to a nurse at all times for urgent questions. Please call the main number to the clinic Dept: [phone] and follow the prompts.   For any non-urgent questions, you may also contact your provider using MyChart. We now offer e-Visits for anyone 61 and older to request care online for non-urgent symptoms. For details visit [email or web address].   Also download the MyChart app! Go to the app store, search "MyChart", open the app, select [place], and log in with your MyChart username and password.

## 2024-04-14 ENCOUNTER — Inpatient Hospital Stay

## 2024-04-23 ENCOUNTER — Other Ambulatory Visit: Payer: Self-pay | Admitting: Hematology

## 2024-04-23 DIAGNOSIS — C189 Malignant neoplasm of colon, unspecified: Secondary | ICD-10-CM

## 2024-04-24 NOTE — Assessment & Plan Note (Signed)
 [id number] stage IV with liver and nodal metastasis, MSS, KRAS G12S(+)  -Diagnosed in 01/2019 after emergent colectomy and liver biopsy. 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  -Started first line chemo on 03/12/2019, received 5FU/leuc with first 2 cycles for large open abdominal wound after surgery. She started full dose FOLFOX and avastin  with cycle 2. Due to neuropathy Oxaliplatin  was stopped after 09/24/19.  --For convenience she was switched to oral chemo Xeloda  2 weeks on/1 week off and Bev every 3 weeks in 01/21/20. She is tolerating well -Her restaging CT scan from 03/11/2023 showed stable liver metastasis, no other new lesions.  -CT 09/29/2023 showed mild disease progression in liver  -treatment changed to FOLFIRI and beva on 10/27/2023

## 2024-04-24 NOTE — Progress Notes (Unsigned)
 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  -Started first line chemo on 03/12/2019, received 5FU/leuc with first 2 cycles for large open abdominal wound after surgery. She started full dose FOLFOX and avastin  with cycle 2. Due to neuropathy Oxaliplatin  was stopped after 09/24/19.  --For convenience she was switched to oral chemo Xeloda  2 weeks on/1 week off and Bev every 3 weeks in 01/21/20. She is tolerating well -Her restaging CT scan from 03/11/2023 showed stable liver metastasis, no other new lesions.  -CT 09/29/2023 showed mild disease progression in liver  -treatment changed to FOLFIRI and beva on 10/27/2023 -PET scan done 03/26/2024.  It showed interval increase in hepatic metastatic disease.  There is also hypermetabolic thoracic nodal metastatic disease in the left paratracheal, right hilar, and left infrahilar, and right subcarinal lymph nodes.  There is portacaval nodal metastatic disease.  It was recommended that chemotherapy be changed from FOLFIRI to FOLFOX.  Initial treatment was done on 04/12/2024. Tolerated well. Bevacizumab  added back to chemotherapy starting with Cycle 2.    Abdominal pain The patient was ween in ED prior to starting FOLFOX. There was no bowel obstruction noted. Stable metastatic disease. She was given a dose of morphine   in the ED. Pain resolved. She states that after that, she has had no further abdominal pain. She presented for chemotherapy the next day and has tolerated this well. Reports soft and normal appearing stool in her colostomy without presence of blood.   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 The patient reports no worsening of peripheral neuropathy. Oxaliplatin  is being given at reduced dose of 70 mg/m2. She is tolerating well. Takes gabapentin  for control of her neuropathy. Will continue to monitor very closely.   Cytopenias due to chemotherapy Mild and stable anemia with Hgb 10.3 and HCT 32.4. No requirement for blood transfusion or iron infusion today. Platelet count slightly low at 130. Will monitor closely, prior to each treatment and intervene as indicated.   Plan Patient seen in infusion suite today.  Labs reviewed.  -mild, stable anemia and mld thrombocytopenia.  -CMP unremarkable.  Tolerating new chemotherapy with FOLFOX very well.  Will inquire about Y-90 procedure with IR.  Patient labs and presentation are appropriate for treatment.  -proceed with Cycle 2 day 1 chemotherapy FOLFOX and bevacizumab .  Labs/flush, follow-up, and next treatment as scheduled.   The patient understands the plans discussed today and is in agreement with them.  She knows to contact our office if she develops concerns prior to her next appointment.  I provided 25 minutes of face-to-face time during this encounter and > 50% was spent counseling as documented under my assessment and plan.    Powell FORBES Lessen, NP  [facility] - A DEPT OF [facility] [address] [place] [address] Dept: [phone] Dept Fax: [phone]   No orders of  the defined types were placed in this encounter.     CHIEF COMPLAINT:  CC: Adenocarcinoma of the colon   Current Treatment: FOLFOX and bevacizumab  every 2 weeks  INTERVAL HISTORY:   Casey Black is here today for repeat clinical assessment.   She had phone visit with Dr. Lanny on 03/27/2024.  They discussed her most recent PET scan done 03/26/2024.  It showed interval increase in hepatic metastatic disease.  There is also hypermetabolic thoracic nodal metastatic disease in the left paratracheal, right hilar, and left infrahilar, and right subcarinal lymph nodes.  There is portacaval nodal metastatic disease.  It was recommended that chemotherapy be changed from FOLFIRI to FOLFOX.  Initial treatment was done on 04/12/2024.  Today, she presents for cycle 2 day 1 chemotherapy FOLFOX.  She tolerated initial cycle very well with minimal negative side effects. She does have baseline peripheral neuropathy from prior chemotherapy. This is not worse since starting FOLFOX. She denies chest pain, chest pressure, or shortness of breath. She denies headaches or visual disturbances. He denies abdominal pain, nausea, vomiting, or changes in bowel or bladder habits.  She denies fevers or chills. She denies pain. Her appetite is good. Her weight has been stable.  I have reviewed the past medical history, past surgical history, social history and family history with the patient and they are unchanged from previous note.  ALLERGIES:  has no known allergies.  MEDICATIONS:  Current Outpatient Medications  Medication Sig Dispense Refill   acetaminophen  (TYLENOL ) 325 MG tablet Take 2 tablets (650 mg total) by mouth every 6 (six) hours as needed. (Patient taking differently: Take 650 mg by mouth every 6 (six) hours as needed for mild pain (pain score 1-3) (or headaches).)     amLODipine  (NORVASC ) 10 MG tablet TAKE 1 TABLET BY MOUTH EVERY DAY 90 tablet 1   [Paused] aspirin  EC 81 MG tablet Take 1 tablet (81 mg total) by mouth daily. Swallow whole. 30 tablet 12   ferrous sulfate  325 (65 FE) MG tablet TAKE 1 TABLET BY MOUTH TWICE A DAY WITH FOOD 180 tablet 1   gabapentin  (NEURONTIN ) 300 MG capsule TAKE 1 CAPSULE BY  MOUTH TWICE A DAY 60 capsule 3   hydrALAZINE  (APRESOLINE ) 25 MG tablet TAKE 1 TABLET BY MOUTH EVERY 8 HOURS (Patient taking differently: Take 25 mg by mouth in the morning and at bedtime.) 270 tablet 0   hydrochlorothiazide  (HYDRODIURIL ) 12.5 MG tablet TAKE 1 TABLET BY MOUTH EVERY DAY (Patient taking differently: Take 12.5 mg by mouth at bedtime.) 90 tablet 1   isosorbide  mononitrate (IMDUR ) 30 MG 24 hr tablet TAKE 1 TABLET BY MOUTH EVERY DAY 90 tablet 1   levothyroxine  (SYNTHROID ) 25 MCG tablet TAKE 1 TABLET BY MOUTH EVERY DAY 90 tablet 1   lidocaine -prilocaine  (EMLA ) cream APPLY 1 APPLICATION TOPICALLY AS NEEDED. 30 g 1   No current facility-administered medications for this visit.   Facility-Administered Medications Ordered in Other Visits  Medication Dose Route Frequency Provider Last Rate Last Admin   0.9 %  sodium chloride  infusion   Intravenous Continuous [name], MD 10 mL/hr at 04/25/24 0907 New Bag at 04/25/24 0907   bevacizumab -awwb (MVASI ) 500 mg in sodium chloride  0.9 % 100 mL chemo infusion  5 mg/kg (Treatment Plan Recorded) Intravenous Once [name], MD       dextrose  5 % solution   Intravenous Continuous [name], MD       famotidine  (PEPCID ) IVPB 20 mg premix  20 mg Intravenous Once Feng,  Onita, MD 200 mL/hr at 04/25/24 0942 20 mg at 04/25/24 0942   fluorouracil  (ADRUCIL ) 4,400 mg in sodium chloride  0.9 % 62 mL chemo infusion  2,000 mg/m2 (Treatment Plan Recorded) Intravenous 1 day or 1 dose Lanny Onita, MD       leucovorin  876 mg in dextrose  5 % 250 mL infusion  400 mg/m2 (Treatment Plan Recorded) Intravenous Once Lanny Onita, MD       oxaliplatin  (ELOXATIN ) 150 mg in dextrose  5 % 500 mL chemo infusion  70 mg/m2 (Treatment Plan Recorded) Intravenous Once Lanny Onita, MD       sodium chloride  flush (NS) 0.9 % injection 10 mL  10 mL Intracatheter PRN Lanny Onita, MD   10 mL at 06/06/20 1051    HISTORY OF PRESENT ILLNESS:   Oncology History Overview Note  Cancer  Staging 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 Bowel-gas pattern consistent with small bowel obstruction. No free air. 2. No acute chest findings.   01/23/2019 Imaging   CT AP IMPRESSION: Obstructing mid transverse colonic mass with mild regional adenopathy and hepatic metastatic disease. The mass likely extends through the serosa; no ascites or peritoneal nodularity.   01/24/2019 Surgery   Surgeon: Mitzie DELENA Freund MD Assistant: Harlene Ferraris PA-C Procedure performed: Transverse colectomy with end colostomy, liver biopsy Procedure classification: URGENT/EMERGENT Preop diagnosis: Obstructing, metastatic transverse colon mass Post-op diagnosis/intraop findings: Same   01/24/2019 Pathology Results   FINAL MICROSCOPIC DIAGNOSIS:   A. COLON, TRANSVERSE, RESECTION:  Colonic adenocarcinoma, 5 cm.  Carcinoma extends into pericolonic connective tissue and focally to  serosal surface.  Margins not involved.  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   B. LIVER NODULE, LEFT, BIOPSY:  Metastatic adenocarcinoma.    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 Hypermetabolic metastatic disease in the liver and mediastinal/hilar/axillary lymph nodes. 2. Focal hypermetabolism in the rectum. Continued attention on follow-up exams is warranted. 3. Focal hypermetabolism medial to the right adrenal gland may be within a metastatic lymph  node, better visualized on 01/23/2019. 4. Aortic atherosclerosis (ICD10-170.0). Coronary artery calcification.   03/12/2019 -  Chemotherapy   She started 5FU q2weeks on 03/12/19 for 2 cycles. She started full dose FOLFOX with Avastin  on 04/09/19. Oxaliplatin  dose reduced repeatedly due to neuropathy C12 and held since C16 on 10/09/19. Now on maintenance Avastin  and 5FU q2weeks since 10/09/19       -Maintenance change to maintenance xeloda  2000 mg BID days 1-14 q21 days and q3 weeks Zirabev  (15 mg/kg) starting 01/21/20. First cycle was taken 1000mg  BID due to misunderstanding.    05/31/2019 Imaging   Restaging CT CAP IMPRESSION: 1. Similar to mild interval decrease in size of multiple hepatic lesions, partially calcified. 2. 2 mm right upper lobe pulmonary nodule. Recommend attention on follow-up. 3. Emphysema and aortic atherosclerosis.   08/23/2019 Imaging   CT CAP w contrast  IMPRESSION: 1. The dominant peripheral right liver metastasis has mildly increased.  Other smaller liver metastases are stable. 2. Otherwise no new or progressive metastatic disease in the chest, abdomen or pelvis. 3. Aortic Atherosclerosis (ICD10-I70.0) and Emphysema (ICD10-J43.9).   11/27/2019 Imaging   CT CAP w contrast  IMPRESSION: Status post transverse colectomy with right mid abdominal colostomy.   Mildly progressive hepatic metastases, as above.   No evidence of metastatic disease in the chest. Small mediastinal lymph nodes are within normal limits.   Additional stable ancillary findings as above.   01/21/2020 - 10/06/2023 Chemotherapy   Patient is on Treatment Plan : COLORECTAL Bevacizumab  q21d     02/21/2020 Imaging   IMPRESSION: 1. Stable hepatic metastatic disease. 2. Aortic atherosclerosis (ICD10-I70.0). Coronary artery calcification. 3.  Emphysema (ICD10-J43.9).   05/19/2020 Imaging   CT CAP  IMPRESSION: 1. Multiple partially calcified liver metastases are again noted. With the exception of a  small lesion in segment 7/8 lesions are not significantly changed in the interval. No new liver lesions identified. 2. Coronary artery atherosclerotic calcifications. 3. Aortic atherosclerosis. 4. 2 mm right upper lobe lung nodule identified.  Unchanged.   Aortic Atherosclerosis (ICD10-I70.0).   11/17/2020 Imaging   CT CAP  IMPRESSION: 1. Partially calcified lesions throughout the liver are stable accounting for differences in technique, contrasted imaging on today's study is compared to noncontrast imaging on the prior. 2. No new hepatic lesions. 3. Tiny 3 mm pulmonary nodule in the RIGHT upper lobe unchanged since the prior study. Attention on follow-up. 4. Mild fullness of RIGHT paratracheal nodal tissue is minimally increased and borderline enlarged, attention on follow-up. 5. RIGHT lower quadrant colostomy. 6. 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 7. Aortic atherosclerosis.   06/09/2021 Imaging   EXAM: CT CHEST, ABDOMEN, AND PELVIS WITH CONTRAST  IMPRESSION: Stable calcified liver metastases.   Stable mildly enlarged right paratracheal lymph node.   No evidence of new or progressive metastatic disease.   Aortic Atherosclerosis (ICD10-I70.0).   09/14/2021 PET scan   IMPRESSION: Multiple hypermetabolic and partially calcified liver metastases, without significant change compared to most recent CT.   Mild hypermetabolic mediastinal and bilateral hilar lymphadenopathy, also without significant change.   No evidence of new or progressive metastatic disease.     Electronically Signed   By: Norleen DELENA Kil M.D.   On: 09/15/2021 11:04   04/15/2022 Imaging    IMPRESSION: 1. Several previously noted partially calcified hepatic metastases appear grossly stable compared to the prior examination. No new hepatic lesions or other signs of metastatic disease  noted elsewhere in the chest, abdomen or pelvis. 2. Aortic atherosclerosis, in addition to left main and three-vessel coronary artery disease. Please note that although the presence of coronary artery calcium  documents the presence of coronary artery disease, the severity of this disease and any potential stenosis cannot be assessed on this non-gated CT examination. Assessment for potential risk factor modification, dietary therapy or pharmacologic therapy may be warranted, if clinically indicated. 3. There are calcifications of the mitral annulus. Echocardiographic correlation for evaluation of potential valvular dysfunction may be warranted if clinically indicated. 4. Additional incidental findings, similar to prior studies, as above.   07/15/2022 Imaging    IMPRESSION: 1. Stable exam. No new or progressive findings in the abdomen or pelvis to suggest worsening disease. 2. No substantial change in calcified liver metastases. 3. Right abdominal transverse end colostomy with parastomal herniation of fat and colon proximal to the stoma.  4. Probable left-sided uterine fibroid measuring on the order of 3.1 cm and displacing the endometrial stripe to the right. Pelvic ultrasound could be used to further evaluate as clinically warranted. 5.  Aortic Atherosclerosis (ICD10-I70.0).     07/07/2023 Imaging   CT chest, abdomen, and pelvis IMPRESSION: Partially calcified treated hepatic metastatic disease remains stable, except for mild increase in size of a single small low-attenuation lesion in central left hepatic lobe suspicious for enlarging metastasis. Recommend correlation with tumor markers, and consider short-term follow-up by CT in 3-6 months.   No other sites of metastatic disease identified.   Stable uterine fibroid.     10/27/2023 - 03/22/2024 Chemotherapy   Patient is on Treatment Plan : COLORECTAL FOLFIRI + Bevacizumab  q14d     04/12/2024 -  Chemotherapy   Patient is on  Treatment Plan : COLORECTAL FOLFOX + Bevacizumab  q14d         REVIEW OF SYSTEMS:   Constitutional: Denies fevers, chills or abnormal weight loss Eyes: Denies blurriness of vision Ears, nose, mouth, throat, and face: Denies mucositis or sore throat Respiratory: Denies cough, dyspnea or wheezes Cardiovascular: Denies palpitation, chest discomfort or lower extremity swelling Gastrointestinal:  Denies nausea, heartburn or change in bowel habits Skin: Denies abnormal skin rashes Lymphatics: Denies new lymphadenopathy or easy bruising Neurological:Denies numbness, tingling or new weaknesses Behavioral/Psych: Mood is stable, no new changes  All other systems were reviewed with the patient and are negative.   VITALS:   Today's Vitals   04/25/24 0916  BP: (!) 153/59  Pulse: (!) 55  Resp: 18  Temp: 98.1 F (36.7 C)  SpO2: 98%  Weight: 227 lb 6.4 oz (103.1 kg)   Body mass index is 35.62 kg/m.   Wt Readings from Last 3 Encounters:  04/25/24 227 lb 6.4 oz (103.1 kg)  04/25/24 227 lb 4 oz (103.1 kg)  04/12/24 222 lb 12 oz (101 kg)    Body mass index is 35.62 kg/m.  Performance status (ECOG): 1 - Symptomatic but completely ambulatory  PHYSICAL EXAM:   GENERAL:alert, no distress and comfortable SKIN: skin color, texture, turgor are normal, no rashes or significant lesions EYES: normal, Conjunctiva are pink and non-injected, sclera clear OROPHARYNX:no exudate, no erythema and lips, buccal mucosa, and tongue normal  NECK: supple, thyroid  normal size, non-tender, without nodularity LYMPH:  no palpable lymphadenopathy in the cervical, axillary or inguinal LUNGS: clear to auscultation and percussion with normal breathing effort HEART: regular rate & rhythm and no murmurs and no lower extremity edema ABDOMEN:abdomen soft, non-tender and normal bowel sounds. Intact and patent colostomy.  Musculoskeletal:no cyanosis of digits and no clubbing  NEURO: alert & oriented x 3 with fluent  speech, no focal motor/sensory deficits  LABORATORY DATA:  I have reviewed the data as listed    Component Value Date/Time   NA 140 04/25/2024 0745   K 4.4 04/25/2024 0745   CL 106 04/25/2024 0745   CO2 23 04/25/2024 0745   GLUCOSE 76 04/25/2024 0745   BUN 21 04/25/2024 0745   CREATININE 1.04 (H) 04/25/2024 0745   CALCIUM  9.7 04/25/2024 0745   PROT 7.5 04/25/2024 0745   ALBUMIN 3.8 04/25/2024 0745   AST 37 04/25/2024 0745   ALT 22 04/25/2024 0745   ALKPHOS 147 (H) 04/25/2024 0745   BILITOT 0.3 04/25/2024 0745   GFRNONAA 60 (L) 04/25/2024 0745   GFRAA >60 01/07/2020 0957    Lab Results  Component Value Date   WBC 5.0 04/25/2024   NEUTROABS 2.8  04/25/2024   HGB 10.3 (L) 04/25/2024   HCT 32.4 (L) 04/25/2024   MCV 91.5 04/25/2024   PLT 130 (L) 04/25/2024    RADIOGRAPHIC STUDIES: CT ABDOMEN PELVIS W CONTRAST Result Date: 04/11/2024 EXAM: CT ABDOMEN AND PELVIS WITH CONTRAST 04/11/2024 02:31:02 AM TECHNIQUE: CT of the abdomen and pelvis was performed with the administration of intravenous contrast. Multiplanar reformatted images are provided for review. Automated exposure control, iterative reconstruction, and/or weight-based adjustment of the mA/kV was utilized to reduce the radiation dose to as low as reasonably achievable. COMPARISON: 03/16/2024. CLINICAL HISTORY: Abdominal pain, acute, nonlocalized. FINDINGS: LOWER CHEST: No acute abnormality. LIVER: Calcified and noncalcified masses throughout the liver are compatible with metastatic disease. These are stable since prior study. GALLBLADDER AND BILE DUCTS: Gallbladder is unremarkable. No biliary ductal dilatation. SPLEEN: No acute abnormality. PANCREAS: No acute abnormality. ADRENAL GLANDS: No acute abnormality. KIDNEYS, URETERS AND BLADDER: No stones in the kidneys or ureters. No hydronephrosis. No perinephric or periureteral stranding. Urinary bladder is unremarkable. GI AND BOWEL: Stomach demonstrates no acute abnormality. Partial  colectomy with right-sided colostomy, unchanged. No bowel obstruction. PERITONEUM AND RETROPERITONEUM: No ascites. No free air. VASCULATURE: Aorta is normal in caliber. Aortic atherosclerosis. LYMPH NODES: No lymphadenopathy. REPRODUCTIVE ORGANS: The endometrium is prominent, measuring 15 mm compared to 19 mm previously. BONES AND SOFT TISSUES: No acute osseous abnormality. No focal soft tissue abnormality. IMPRESSION: 1. Partial colectomy with right-sided colostomy, unchanged, without bowel obstruction. 2. Stable calcified and noncalcified masses throughout the liver compatible with metastatic disease. 3. Prominent endometrium measuring 15 mm, decreased from 19 mm previously, with gynecologic evaluation recommended. Electronically signed by: Franky Crease MD 04/11/2024 02:37 AM EST RP Workstation: [id number]   "

## 2024-04-25 ENCOUNTER — Inpatient Hospital Stay: Admitting: Nurse Practitioner

## 2024-04-25 ENCOUNTER — Encounter: Payer: Self-pay | Admitting: Nurse Practitioner

## 2024-04-25 ENCOUNTER — Inpatient Hospital Stay

## 2024-04-25 VITALS — BP 153/59 | HR 55 | Temp 98.1°F | Resp 18 | Wt 227.4 lb

## 2024-04-25 VITALS — BP 153/59 | HR 55 | Temp 98.1°F | Resp 18 | Wt 227.2 lb

## 2024-04-25 DIAGNOSIS — Z5111 Encounter for antineoplastic chemotherapy: Secondary | ICD-10-CM | POA: Diagnosis not present

## 2024-04-25 DIAGNOSIS — C189 Malignant neoplasm of colon, unspecified: Secondary | ICD-10-CM

## 2024-04-25 DIAGNOSIS — C787 Secondary malignant neoplasm of liver and intrahepatic bile duct: Secondary | ICD-10-CM | POA: Diagnosis not present

## 2024-04-25 LAB — CBC WITH DIFFERENTIAL (CANCER CENTER ONLY)
Abs Immature Granulocytes: 0 K/uL (ref 0.00–0.07)
Basophils Absolute: 0 K/uL (ref 0.0–0.1)
Basophils Relative: 1 %
Eosinophils Absolute: 0.3 K/uL (ref 0.0–0.5)
Eosinophils Relative: 6 %
HCT: 32.4 % — ABNORMAL LOW (ref 36.0–46.0)
Hemoglobin: 10.3 g/dL — ABNORMAL LOW (ref 12.0–15.0)
Immature Granulocytes: 0 %
Lymphocytes Relative: 29 %
Lymphs Abs: 1.4 K/uL (ref 0.7–4.0)
MCH: 29.1 pg (ref 26.0–34.0)
MCHC: 31.8 g/dL (ref 30.0–36.0)
MCV: 91.5 fL (ref 80.0–100.0)
Monocytes Absolute: 0.4 K/uL (ref 0.1–1.0)
Monocytes Relative: 7 %
Neutro Abs: 2.8 K/uL (ref 1.7–7.7)
Neutrophils Relative %: 57 %
Platelet Count: 130 K/uL — ABNORMAL LOW (ref 150–400)
RBC: 3.54 MIL/uL — ABNORMAL LOW (ref 3.87–5.11)
RDW: 16.2 % — ABNORMAL HIGH (ref 11.5–15.5)
WBC Count: 5 K/uL (ref 4.0–10.5)
nRBC: 0 % (ref 0.0–0.2)

## 2024-04-25 LAB — CMP (CANCER CENTER ONLY)
ALT: 22 U/L (ref 0–44)
AST: 37 U/L (ref 15–41)
Albumin: 3.8 g/dL (ref 3.5–5.0)
Alkaline Phosphatase: 147 U/L — ABNORMAL HIGH (ref 38–126)
Anion gap: 11 (ref 5–15)
BUN: 21 mg/dL (ref 8–23)
CO2: 23 mmol/L (ref 22–32)
Calcium: 9.7 mg/dL (ref 8.9–10.3)
Chloride: 106 mmol/L (ref 98–111)
Creatinine: 1.04 mg/dL — ABNORMAL HIGH (ref 0.44–1.00)
GFR, Estimated: 60 mL/min — ABNORMAL LOW
Glucose, Bld: 76 mg/dL (ref 70–99)
Potassium: 4.4 mmol/L (ref 3.5–5.1)
Sodium: 140 mmol/L (ref 135–145)
Total Bilirubin: 0.3 mg/dL (ref 0.0–1.2)
Total Protein: 7.5 g/dL (ref 6.5–8.1)

## 2024-04-25 LAB — TOTAL PROTEIN, URINE DIPSTICK: Protein, ur: NEGATIVE mg/dL

## 2024-04-25 MED ORDER — OXALIPLATIN CHEMO INJECTION 100 MG/20ML
70.0000 mg/m2 | Freq: Once | INTRAVENOUS | Status: AC
  Administered 2024-04-25: 150 mg via INTRAVENOUS
  Filled 2024-04-25: qty 20

## 2024-04-25 MED ORDER — SODIUM CHLORIDE 0.9 % IV SOLN: INTRAVENOUS | Status: DC

## 2024-04-25 MED ORDER — FAMOTIDINE IN NACL 20-0.9 MG/50ML-% IV SOLN
20.0000 mg | Freq: Once | INTRAVENOUS | Status: AC
  Administered 2024-04-25: 20 mg via INTRAVENOUS
  Filled 2024-04-25: qty 50

## 2024-04-25 MED ORDER — SODIUM CHLORIDE 0.9 % IV SOLN
5.0000 mg/kg | Freq: Once | INTRAVENOUS | Status: AC
  Administered 2024-04-25: 500 mg via INTRAVENOUS
  Filled 2024-04-25: qty 4

## 2024-04-25 MED ORDER — DIPHENHYDRAMINE HCL 25 MG PO CAPS
25.0000 mg | ORAL_CAPSULE | Freq: Once | ORAL | Status: AC
  Administered 2024-04-25: 25 mg via ORAL
  Filled 2024-04-25: qty 1

## 2024-04-25 MED ORDER — LEUCOVORIN CALCIUM INJECTION 350 MG
400.0000 mg/m2 | Freq: Once | INTRAVENOUS | Status: AC
  Administered 2024-04-25: 876 mg via INTRAVENOUS
  Filled 2024-04-25: qty 43.8

## 2024-04-25 MED ORDER — DEXAMETHASONE SOD PHOSPHATE PF 10 MG/ML IJ SOLN
10.0000 mg | Freq: Once | INTRAMUSCULAR | Status: AC
  Administered 2024-04-25: 10 mg via INTRAVENOUS
  Filled 2024-04-25: qty 1

## 2024-04-25 MED ORDER — SODIUM CHLORIDE 0.9 % IV SOLN
2000.0000 mg/m2 | INTRAVENOUS | Status: DC
  Administered 2024-04-25: 4400 mg via INTRAVENOUS
  Filled 2024-04-25: qty 88

## 2024-04-25 MED ORDER — DEXTROSE 5 % IV SOLN: INTRAVENOUS | Status: DC

## 2024-04-25 MED ORDER — PALONOSETRON HCL INJECTION 0.25 MG/5ML
0.2500 mg | Freq: Once | INTRAVENOUS | Status: AC
  Administered 2024-04-25: 0.25 mg via INTRAVENOUS
  Filled 2024-04-25: qty 5

## 2024-04-25 NOTE — Patient Instructions (Signed)
 [facility] - A DEPT OF [facility]  Discharge Instructions: Thank you for choosing [facility] to provide your oncology and hematology care.   If you have a lab appointment with the Cancer Center, please go directly to the Cancer Center and check in at the registration area.   Wear comfortable clothing and clothing appropriate for easy access to any Portacath or PICC line.   We strive to give you quality time with your provider. You may need to reschedule your appointment if you arrive late (15 or more minutes).  Arriving late affects you and other patients whose appointments are after yours.  Also, if you miss three or more appointments without notifying the office, you may be dismissed from the clinic at the providers discretion.      For prescription refill requests, have your pharmacy contact our office and allow 72 hours for refills to be completed.    Today you received the following chemotherapy and/or immunotherapy agents oxaliplatin  leucovorin  adrucil  mvasi       To help prevent nausea and vomiting after your treatment, we encourage you to take your nausea medication as directed.  BELOW ARE SYMPTOMS THAT SHOULD BE REPORTED IMMEDIATELY: *FEVER GREATER THAN 100.4 F (38 C) OR HIGHER *CHILLS OR SWEATING *NAUSEA AND VOMITING THAT IS NOT CONTROLLED WITH YOUR NAUSEA MEDICATION *UNUSUAL SHORTNESS OF BREATH *UNUSUAL BRUISING OR BLEEDING *URINARY PROBLEMS (pain or burning when urinating, or frequent urination) *BOWEL PROBLEMS (unusual diarrhea, constipation, pain near the anus) TENDERNESS IN MOUTH AND THROAT WITH OR WITHOUT PRESENCE OF ULCERS (sore throat, sores in mouth, or a toothache) UNUSUAL RASH, SWELLING OR PAIN  UNUSUAL VAGINAL DISCHARGE OR ITCHING   Items with * indicate a potential emergency and should be followed up as soon as possible or go to the Emergency Department if any problems should occur.  Please show the CHEMOTHERAPY  ALERT CARD or IMMUNOTHERAPY ALERT CARD at check-in to the Emergency Department and triage nurse.  Should you have questions after your visit or need to cancel or reschedule your appointment, please contact [facility] - A DEPT [facility]  Dept: [phone]  and follow the prompts.  Office hours are 8:00 a.m. to 4:30 p.m. Monday - Friday. Please note that voicemails left after 4:00 p.m. may not be returned until the following business day.  We are closed weekends and major holidays. You have access to a nurse at all times for urgent questions. Please call the main number to the clinic Dept: [phone] and follow the prompts.   For any non-urgent questions, you may also contact your provider using MyChart. We now offer e-Visits for anyone 34 and older to request care online for non-urgent symptoms. For details visit [email or web address].   Also download the MyChart app! Go to the app store, search MyChart, open the app, select [facility], and log in with your MyChart username and password.

## 2024-04-26 ENCOUNTER — Other Ambulatory Visit: Payer: Self-pay

## 2024-04-26 ENCOUNTER — Telehealth: Payer: Self-pay

## 2024-04-26 NOTE — Telephone Encounter (Signed)
 Patient lvm requesting a c/b in regards to her upcoming rocedure scheduled on the 30th. This medical assistant returned patient's telephone call but was not able to reach the patient. LVM requesting a c/b with more info about the upcoming procedure.

## 2024-04-27 ENCOUNTER — Inpatient Hospital Stay

## 2024-04-27 ENCOUNTER — Other Ambulatory Visit: Payer: Self-pay

## 2024-04-27 ENCOUNTER — Telehealth: Payer: Self-pay | Admitting: Nurse Practitioner

## 2024-04-27 NOTE — Telephone Encounter (Signed)
 I called and spoke with patient this afternoon. I advised her that we were holding off on radiation treatment for now. Most recent PET scan results had shown increase in the size of liver metastases. Chemotherapy has just been changed following that scan. Intention is to shrink the metastatic lesions on the liver with chemotherapy prior to moving forward with radiation. We would consider a new consultation with IR after her next scan, based on results. All questions were answered and she voiced understanding.  -Powell Lessen, NP

## 2024-05-03 ENCOUNTER — Ambulatory Visit: Admitting: Podiatry

## 2024-05-03 ENCOUNTER — Ambulatory Visit (INDEPENDENT_AMBULATORY_CARE_PROVIDER_SITE_OTHER)

## 2024-05-03 ENCOUNTER — Encounter: Payer: Self-pay | Admitting: Podiatry

## 2024-05-03 DIAGNOSIS — M778 Other enthesopathies, not elsewhere classified: Secondary | ICD-10-CM

## 2024-05-03 DIAGNOSIS — M7752 Other enthesopathy of left foot: Secondary | ICD-10-CM | POA: Diagnosis not present

## 2024-05-03 DIAGNOSIS — M79674 Pain in right toe(s): Secondary | ICD-10-CM

## 2024-05-03 DIAGNOSIS — M79675 Pain in left toe(s): Secondary | ICD-10-CM

## 2024-05-03 DIAGNOSIS — B351 Tinea unguium: Secondary | ICD-10-CM | POA: Diagnosis not present

## 2024-05-03 MED ORDER — TRIAMCINOLONE ACETONIDE 10 MG/ML IJ SUSP
10.0000 mg | Freq: Once | INTRAMUSCULAR | Status: AC
  Administered 2024-05-03: 10 mg via INTRA_ARTICULAR

## 2024-05-03 MED ORDER — DOXYCYCLINE HYCLATE 100 MG PO TABS: 100.0000 mg | ORAL_TABLET | Freq: Two times a day (BID) | ORAL | 1 refills | Status: AC

## 2024-05-03 NOTE — Progress Notes (Signed)
 Subjective:   Patient ID: Casey Black, female   DOB: 64 y.o.   MRN: [id number]   HPI Patient presents stating she is having so much pain around her big toe joint over the last few days.  She is undergoing chemotherapy for severe cancer stage IV and has been having a lot of problems.  Has not noted any other drainage and this has been a recent occurrence   ROS      Objective:  Physical Exam  Neuro vascular status unchanged she has got inflammation fluid around the first MPJ left I did not see any break in tissue at this time it appears to be localized but very painful within the joint surface.  Patient does have a thickened toenail left hallux      Assessment:  Inflammatory capsulitis left     Plan:  H&P reviewed I do not think infection but so hard to tell with the medications and chemo that she is on service precautionary measure I did just put her on an antibiotic for 10 days and due to the intensity of discomfort around the joint surface I did inject 3 mg dexamethasone  Kenalog  5 mg Xylocaine  around the joint and applied sterile dressing.  Reappoint to recheck as needed hopefully this will solve and reduce her symptoms  X-rays indicate no signs of calcification or other bony pathology associated with condition

## 2024-05-09 ENCOUNTER — Inpatient Hospital Stay: Attending: Hematology

## 2024-05-09 ENCOUNTER — Inpatient Hospital Stay: Admitting: Hematology

## 2024-05-09 ENCOUNTER — Inpatient Hospital Stay

## 2024-05-09 VITALS — BP 132/62 | HR 76 | Temp 98.7°F | Resp 16 | Wt 227.8 lb

## 2024-05-09 DIAGNOSIS — C189 Malignant neoplasm of colon, unspecified: Secondary | ICD-10-CM

## 2024-05-09 DIAGNOSIS — C787 Secondary malignant neoplasm of liver and intrahepatic bile duct: Secondary | ICD-10-CM | POA: Diagnosis not present

## 2024-05-09 LAB — CMP (CANCER CENTER ONLY)
ALT: 21 U/L (ref 0–44)
AST: 32 U/L (ref 15–41)
Albumin: 4 g/dL (ref 3.5–5.0)
Alkaline Phosphatase: 145 U/L — ABNORMAL HIGH (ref 38–126)
Anion gap: 11 (ref 5–15)
BUN: 18 mg/dL (ref 8–23)
CO2: 22 mmol/L (ref 22–32)
Calcium: 9.9 mg/dL (ref 8.9–10.3)
Chloride: 106 mmol/L (ref 98–111)
Creatinine: 0.89 mg/dL (ref 0.44–1.00)
GFR, Estimated: >60 mL/min
Glucose, Bld: 78 mg/dL (ref 70–99)
Potassium: 4.4 mmol/L (ref 3.5–5.1)
Sodium: 139 mmol/L (ref 135–145)
Total Bilirubin: 0.4 mg/dL (ref 0.0–1.2)
Total Protein: 7.7 g/dL (ref 6.5–8.1)

## 2024-05-09 LAB — TOTAL PROTEIN, URINE DIPSTICK: Protein, ur: 30 mg/dL — AB

## 2024-05-09 LAB — CBC WITH DIFFERENTIAL (CANCER CENTER ONLY)
Abs Immature Granulocytes: 0.02 10*3/uL (ref 0.00–0.07)
Basophils Absolute: 0 10*3/uL (ref 0.0–0.1)
Basophils Relative: 1 %
Eosinophils Absolute: 0.2 10*3/uL (ref 0.0–0.5)
Eosinophils Relative: 2 %
HCT: 33.5 % — ABNORMAL LOW (ref 36.0–46.0)
Hemoglobin: 10.5 g/dL — ABNORMAL LOW (ref 12.0–15.0)
Immature Granulocytes: 0 %
Lymphocytes Relative: 20 %
Lymphs Abs: 1.4 10*3/uL (ref 0.7–4.0)
MCH: 29.1 pg (ref 26.0–34.0)
MCHC: 31.3 g/dL (ref 30.0–36.0)
MCV: 92.8 fL (ref 80.0–100.0)
Monocytes Absolute: 0.7 10*3/uL (ref 0.1–1.0)
Monocytes Relative: 10 %
Neutro Abs: 4.5 10*3/uL (ref 1.7–7.7)
Neutrophils Relative %: 67 %
Platelet Count: 155 10*3/uL (ref 150–400)
RBC: 3.61 MIL/uL — ABNORMAL LOW (ref 3.87–5.11)
RDW: 17.4 % — ABNORMAL HIGH (ref 11.5–15.5)
WBC Count: 6.7 10*3/uL (ref 4.0–10.5)
nRBC: 0 % (ref 0.0–0.2)

## 2024-05-09 LAB — CEA (ACCESS): CEA (CHCC): 373.64 ng/mL — ABNORMAL HIGH (ref 0.00–5.00)

## 2024-05-09 MED ORDER — SODIUM CHLORIDE 0.9 % IV SOLN: INTRAVENOUS | Status: DC

## 2024-05-09 MED ORDER — FAMOTIDINE IN NACL 20-0.9 MG/50ML-% IV SOLN
20.0000 mg | Freq: Once | INTRAVENOUS | Status: AC
  Administered 2024-05-09: 20 mg via INTRAVENOUS
  Filled 2024-05-09: qty 50

## 2024-05-09 MED ORDER — DEXAMETHASONE SOD PHOSPHATE PF 10 MG/ML IJ SOLN
10.0000 mg | Freq: Once | INTRAMUSCULAR | Status: AC
  Administered 2024-05-09: 10 mg via INTRAVENOUS
  Filled 2024-05-09: qty 1

## 2024-05-09 MED ORDER — OXALIPLATIN CHEMO INJECTION 100 MG/20ML
70.0000 mg/m2 | Freq: Once | INTRAVENOUS | Status: AC
  Administered 2024-05-09: 150 mg via INTRAVENOUS
  Filled 2024-05-09: qty 10

## 2024-05-09 MED ORDER — LEUCOVORIN CALCIUM INJECTION 350 MG
400.0000 mg/m2 | Freq: Once | INTRAVENOUS | Status: AC
  Administered 2024-05-09: 876 mg via INTRAVENOUS
  Filled 2024-05-09: qty 43.8

## 2024-05-09 MED ORDER — PALONOSETRON HCL INJECTION 0.25 MG/5ML
0.2500 mg | Freq: Once | INTRAVENOUS | Status: AC
  Administered 2024-05-09: 0.25 mg via INTRAVENOUS
  Filled 2024-05-09: qty 5

## 2024-05-09 MED ORDER — SODIUM CHLORIDE 0.9 % IV SOLN
2000.0000 mg/m2 | INTRAVENOUS | Status: DC
  Administered 2024-05-09: 4400 mg via INTRAVENOUS
  Filled 2024-05-09: qty 88

## 2024-05-09 MED ORDER — DIPHENHYDRAMINE HCL 25 MG PO CAPS
25.0000 mg | ORAL_CAPSULE | Freq: Once | ORAL | Status: AC
  Administered 2024-05-09: 25 mg via ORAL
  Filled 2024-05-09: qty 1

## 2024-05-09 MED ORDER — SODIUM CHLORIDE 0.9 % IV SOLN
5.0000 mg/kg | Freq: Once | INTRAVENOUS | Status: AC
  Administered 2024-05-09: 500 mg via INTRAVENOUS
  Filled 2024-05-09: qty 16

## 2024-05-09 MED ORDER — DEXTROSE 5 % IV SOLN: INTRAVENOUS | Status: DC

## 2024-05-09 NOTE — Patient Instructions (Signed)
 [facility] - A DEPT OF [facility]  Discharge Instructions: Thank you for choosing [facility] to provide your oncology and hematology care.   If you have a lab appointment with the Cancer Center, please go directly to the Cancer Center and check in at the registration area.   Wear comfortable clothing and clothing appropriate for easy access to any Portacath or PICC line.   We strive to give you quality time with your provider. You may need to reschedule your appointment if you arrive late (15 or more minutes).  Arriving late affects you and other patients whose appointments are after yours.  Also, if you miss three or more appointments without notifying the office, you may be dismissed from the clinic at the providers discretion.      For prescription refill requests, have your pharmacy contact our office and allow 72 hours for refills to be completed.    Today you received the following chemotherapy and/or immunotherapy agents avastin , oxaliplatin  leucovorin ,fluorouracil       To help prevent nausea and vomiting after your treatment, we encourage you to take your nausea medication as directed.  BELOW ARE SYMPTOMS THAT SHOULD BE REPORTED IMMEDIATELY: *FEVER GREATER THAN 100.4 F (38 C) OR HIGHER *CHILLS OR SWEATING *NAUSEA AND VOMITING THAT IS NOT CONTROLLED WITH YOUR NAUSEA MEDICATION *UNUSUAL SHORTNESS OF BREATH *UNUSUAL BRUISING OR BLEEDING *URINARY PROBLEMS (pain or burning when urinating, or frequent urination) *BOWEL PROBLEMS (unusual diarrhea, constipation, pain near the anus) TENDERNESS IN MOUTH AND THROAT WITH OR WITHOUT PRESENCE OF ULCERS (sore throat, sores in mouth, or a toothache) UNUSUAL RASH, SWELLING OR PAIN  UNUSUAL VAGINAL DISCHARGE OR ITCHING   Items with * indicate a potential emergency and should be followed up as soon as possible or go to the Emergency Department if any problems should occur.  Please show the  CHEMOTHERAPY ALERT CARD or IMMUNOTHERAPY ALERT CARD at check-in to the Emergency Department and triage nurse.  Should you have questions after your visit or need to cancel or reschedule your appointment, please contact [facility] - A DEPT OF [facility]  Dept: [phone]  and follow the prompts.  Office hours are 8:00 a.m. to 4:30 p.m. Monday - Friday. Please note that voicemails left after 4:00 p.m. may not be returned until the following business day.  We are closed weekends and major holidays. You have access to a nurse at all times for urgent questions. Please call the main number to the clinic Dept: [phone] and follow the prompts.   For any non-urgent questions, you may also contact your provider using MyChart. We now offer e-Visits for anyone 38 and older to request care online for non-urgent symptoms. For details visit [email or web address].   Also download the MyChart app! Go to the app store, search MyChart, open the app, select [place], and log in with your MyChart username and password.

## 2024-05-09 NOTE — Progress Notes (Signed)
 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  -Started first line chemo on 03/12/2019, received 5FU/leuc with first 2 cycles for large open abdominal wound after surgery. She started full dose FOLFOX and avastin  with cycle 2. Due to neuropathy Oxaliplatin  was stopped after 09/24/19.  --For convenience she was switched to oral chemo Xeloda  2 weeks on/1 week off and Bev every 3 weeks in 01/21/20. She is tolerating well -Her restaging CT scan from 03/11/2023 showed stable liver metastasis, no other new lesions.  -CT 09/29/2023 showed mild disease progression in liver  -treatment changed to FOLFIRI and beva on 10/27/2023 -restaging PET 03/26/2024 showed disease progression in liver and node metastasis  -chemo changed to FOLFOX and beva   Assessment & Plan Metastatic colon adenocarcinoma with liver and mediastinal lymph node metastases Recurrent, stage IV colon adenocarcinoma with persistent hepatic and mediastinal lymph node metastases. Mild anemia is present but blood counts are otherwise stable. Yttrium-90 radioembolization was deferred due to extrahepatic disease. She maintains functional status and part-time  employment. - Restarted bevacizumab  maintenance. - Scheduled chemotherapy infusions for February 18 and March 5, with afternoon appointments to accommodate work. - Repeat imaging after cycle 5 in early March 2026 to assess response. - Regular monitoring of blood counts, renal and hepatic function, urine protein, and blood pressure. - Provided education to avoid prolonged cold exposure and to wrap up due to chemotherapy-induced cold sensitivity.  Chemotherapy-induced peripheral neuropathy Persistent mild numbness and tingling, stable compared to prior cycles, managed with gabapentin . She experienced significant right foot pain and inflammation after prolonged cold exposure, which resolved after podiatric evaluation and treatment. - Continue gabapentin  for neuropathy management. - Advise to avoid prolonged cold exposure and to wrap up when outside.  Anemia Mild, asymptomatic anemia with other hematologic parameters within normal limits. - Monitor blood counts; no intervention unless anemia worsens.  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 Bowel-gas pattern consistent with small bowel obstruction. No free air. 2. No acute chest findings.   01/23/2019 Imaging   CT AP IMPRESSION: Obstructing mid transverse colonic mass with mild regional adenopathy and hepatic metastatic disease. The mass likely extends through the serosa; no ascites or peritoneal nodularity.   01/24/2019 Surgery    Surgeon: [name] Signe MD Assistant: Harlene Ferraris PA-C  Procedure performed: Transverse colectomy with end colostomy, liver biopsy Procedure classification: URGENT/EMERGENT Preop diagnosis: Obstructing, metastatic transverse colon mass Post-op diagnosis/intraop findings: Same   01/24/2019 Pathology Results   FINAL MICROSCOPIC DIAGNOSIS:   A. COLON, TRANSVERSE, RESECTION:  Colonic adenocarcinoma, 5 cm.  Carcinoma extends into pericolonic connective tissue and focally to  serosal surface.  Margins not involved.  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   B. LIVER NODULE, LEFT, BIOPSY:  Metastatic adenocarcinoma.    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 Hypermetabolic metastatic disease in the liver and mediastinal/hilar/axillary lymph nodes. 2. Focal hypermetabolism in the rectum. Continued attention on follow-up exams is warranted. 3. Focal hypermetabolism medial to the right adrenal gland may be within a metastatic lymph node, better visualized on 01/23/2019. 4. Aortic atherosclerosis (ICD10-170.0). Coronary artery calcification.   03/12/2019 -  Chemotherapy   She started 5FU q2weeks on 03/12/19 for 2 cycles. She started full dose FOLFOX with Avastin  on 04/09/19. Oxaliplatin  dose reduced repeatedly due to neuropathy C12 and held since C16 on 10/09/19. Now on maintenance Avastin  and 5FU q2weeks since 10/09/19       -Maintenance change to maintenance xeloda  2000 mg BID days 1-14 q21 days and q3 weeks Zirabev  (15 mg/kg) starting 01/21/20. First cycle was taken 1000mg  BID due to misunderstanding.    05/31/2019 Imaging   Restaging CT CAP IMPRESSION: 1. Similar to mild interval decrease in size of  multiple hepatic lesions, partially calcified. 2. 2 mm right upper lobe pulmonary nodule. Recommend attention on follow-up. 3. Emphysema and aortic atherosclerosis.   08/23/2019 Imaging   CT CAP w contrast  IMPRESSION: 1. The dominant peripheral right liver metastasis has mildly increased. Other smaller liver metastases are stable. 2. Otherwise no new or progressive metastatic disease in the chest, abdomen or pelvis. 3. Aortic Atherosclerosis (ICD10-I70.0) and Emphysema (ICD10-J43.9).   11/27/2019 Imaging   CT CAP w contrast  IMPRESSION: Status post transverse colectomy with right mid abdominal colostomy.   Mildly progressive hepatic metastases, as above.   No evidence of metastatic disease in the chest. Small mediastinal lymph nodes are within normal limits.   Additional stable ancillary findings as above.   01/21/2020 - 10/06/2023 Chemotherapy   Patient is on Treatment Plan : COLORECTAL Bevacizumab  q21d     02/21/2020 Imaging   IMPRESSION: 1. Stable hepatic metastatic disease. 2. Aortic atherosclerosis (ICD10-I70.0). Coronary artery calcification. 3.  Emphysema (ICD10-J43.9).   05/19/2020 Imaging   CT CAP  IMPRESSION: 1. Multiple partially calcified liver metastases are again noted. With the exception of a small lesion in segment 7/8 lesions are not significantly changed in the interval. No new liver lesions identified. 2. Coronary artery atherosclerotic calcifications. 3. Aortic atherosclerosis. 4. 2 mm right upper lobe lung nodule identified.  Unchanged.   Aortic Atherosclerosis (ICD10-I70.0).   11/17/2020 Imaging   CT CAP  IMPRESSION: 1. Partially calcified lesions throughout the liver are stable accounting for differences in technique, contrasted imaging on today's study is compared to noncontrast imaging on the prior. 2. No new hepatic lesions. 3. Tiny 3 mm pulmonary nodule in the RIGHT upper lobe unchanged since the prior study. Attention on follow-up. 4.  Mild fullness of RIGHT paratracheal nodal tissue is minimally  increased and borderline enlarged, attention on follow-up. 5. RIGHT lower quadrant colostomy. 6. 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 7. Aortic atherosclerosis.   06/09/2021 Imaging   EXAM: CT CHEST, ABDOMEN, AND PELVIS WITH CONTRAST  IMPRESSION: Stable calcified liver metastases.   Stable mildly enlarged right paratracheal lymph node.   No evidence of new or progressive metastatic disease.   Aortic Atherosclerosis (ICD10-I70.0).   09/14/2021 PET scan   IMPRESSION: Multiple hypermetabolic and partially calcified liver metastases, without significant change compared to most recent CT.   Mild hypermetabolic mediastinal and bilateral hilar lymphadenopathy, also without significant change.   No evidence of new or progressive metastatic disease.     Electronically Signed   By: Norleen DELENA Kil M.D.   On: 09/15/2021 11:04   04/15/2022 Imaging    IMPRESSION: 1. Several previously noted partially calcified hepatic metastases appear grossly stable compared to the prior examination. No new hepatic lesions or other signs of metastatic disease noted elsewhere in the chest, abdomen or pelvis. 2. Aortic atherosclerosis, in addition to left main and three-vessel coronary artery disease. Please note that although the presence of coronary artery calcium  documents the presence of coronary artery disease, the severity of this disease and any potential stenosis cannot be assessed on this non-gated CT examination. Assessment for potential risk factor modification, dietary therapy or pharmacologic therapy may be warranted, if clinically indicated. 3. There are calcifications of the mitral annulus. Echocardiographic correlation for evaluation of potential valvular dysfunction may be warranted if clinically  indicated. 4. Additional incidental findings, similar to prior studies, as above.   07/15/2022 Imaging    IMPRESSION: 1. Stable exam. No new or progressive findings in the abdomen or pelvis to suggest worsening disease. 2. No substantial change in calcified liver metastases. 3. Right abdominal transverse end colostomy with parastomal herniation of fat and colon proximal to the stoma. 4. Probable left-sided uterine fibroid measuring on the order of 3.1 cm and displacing the endometrial stripe to the right. Pelvic ultrasound could be used to further evaluate as clinically warranted. 5.  Aortic Atherosclerosis (ICD10-I70.0).     07/07/2023 Imaging   CT chest, abdomen, and pelvis IMPRESSION: Partially calcified treated hepatic metastatic disease remains stable, except for mild increase in size of a single small low-attenuation lesion in central left hepatic lobe suspicious for enlarging metastasis. Recommend correlation with tumor markers, and consider short-term follow-up by CT in 3-6 months.   No other sites of metastatic disease identified.   Stable uterine fibroid.     10/27/2023 - 03/22/2024 Chemotherapy   Patient is on Treatment Plan : COLORECTAL FOLFIRI + Bevacizumab  q14d     04/12/2024 -  Chemotherapy   Patient is on Treatment Plan : COLORECTAL FOLFOX + Bevacizumab  q14d        Discussed the use of AI scribe software for clinical note transcription with the patient, who gave verbal consent to proceed.  History of Present Illness Casey Black is a 64 year old female with metastatic colon adenocarcinoma who presents for follow-up of systemic chemotherapy and management of chemotherapy-induced neuropathy and mild anemia.  She is on cycle 3 of FOLFOX with recently restarted bevacizumab . She maintains adequate oral intake and functional status, continues to work part-time, and has stable counts except for mild anemia.  Two days after her last infusion she had severe right  foot pain and inflammation after prolonged cold exposure that interfered with  sleep. A foot specialist ruled out infection, treated the inflammation with an injection, and symptoms fully resolved. She has avoided further cold exposure without recurrence.  She has stable numbness and tingling from chemotherapy-induced peripheral neuropathy that she controls with gabapentin . Symptoms have not progressed.  She is confused about the previously planned Y90 radioembolization for liver metastases that was canceled after PET showed extrahepatic disease.  She requested more flexible, preferably afternoon, chemotherapy appointments to reduce missed work. She is moving to support her family but continues to perform daily activities.  Mar 27, 2024: Follow-up visit to review PET scan results for metastatic colon adenocarcinoma. PET confirmed persistent hepatic and mediastinal lymph node metastases, precluding liver-directed therapy. Patient clinically well, stable peripheral neuropathy, continuing systemic therapy with plan to resume FOLFOX (with possible oxaliplatin  dose reduction) starting April 09, 2024.     All other systems were reviewed with the patient and are negative.  MEDICAL HISTORY:  Past Medical History:  Diagnosis Date   Anemia    low iron   Colon cancer (HCC) 01/2019   Hypertension 01/23/2019   Personal history of chemotherapy 01/2019   colon CA   SBO (small bowel obstruction) (HCC) 01/23/2019    SURGICAL HISTORY: Past Surgical History:  Procedure Laterality Date   CESAREAN SECTION     x2   COLOSTOMY N/A 01/24/2019   Procedure: End Loop Colostomy;  Surgeon: [name], MD;  Location: [facility] OR;  Service: General;  Laterality: N/A;   IR RADIOLOGIST EVAL & MGMT  03/23/2024   PARTIAL COLECTOMY N/A 01/24/2019   Procedure: PARTIAL COLECTOMY;  Surgeon: [name], MD;  Location: [facility] OR;  Service: General;  Laterality: N/A;   PORTACATH PLACEMENT Right 02/28/2019   Procedure:  INSERTION PORT-A-CATH WITH ULTRASOUND GUIDANCE;  Surgeon: [name], MD;  Location: [facility] OR;  Service: General;  Laterality: Right;    I have reviewed the social history and family history with the patient and they are unchanged from previous note.  ALLERGIES:  has no known allergies.  MEDICATIONS:  Current Outpatient Medications  Medication Sig Dispense Refill   acetaminophen  (TYLENOL ) 325 MG tablet Take 2 tablets (650 mg total) by mouth every 6 (six) hours as needed. (Patient taking differently: Take 650 mg by mouth every 6 (six) hours as needed for mild pain (pain score 1-3) (or headaches).)     amLODipine  (NORVASC ) 10 MG tablet TAKE 1 TABLET BY MOUTH EVERY DAY 90 tablet 1   [Paused] aspirin  EC 81 MG tablet Take 1 tablet (81 mg total) by mouth daily. Swallow whole. 30 tablet 12   doxycycline  (VIBRA -TABS) 100 MG tablet Take 1 tablet (100 mg total) by mouth 2 (two) times daily. 20 tablet 1   ferrous sulfate  325 (65 FE) MG tablet TAKE 1 TABLET BY MOUTH TWICE A DAY WITH FOOD 180 tablet 1   gabapentin  (NEURONTIN ) 300 MG capsule TAKE 1 CAPSULE BY MOUTH TWICE A DAY 60 capsule 3   hydrALAZINE  (APRESOLINE ) 25 MG tablet TAKE 1 TABLET BY MOUTH EVERY 8 HOURS (Patient taking differently: Take 25 mg by mouth in the morning and at bedtime.) 270 tablet 0   hydrochlorothiazide  (HYDRODIURIL ) 12.5 MG tablet TAKE 1 TABLET BY MOUTH EVERY DAY (Patient taking differently: Take 12.5 mg by mouth at bedtime.) 90 tablet 1   isosorbide  mononitrate (IMDUR ) 30 MG 24 hr tablet TAKE 1 TABLET BY MOUTH EVERY DAY 90 tablet 1   levothyroxine  (SYNTHROID ) 25 MCG tablet TAKE 1 TABLET BY MOUTH EVERY DAY 90 tablet  1   lidocaine -prilocaine  (EMLA ) cream APPLY 1 APPLICATION TOPICALLY AS NEEDED. 30 g 1   No current facility-administered medications for this visit.   Facility-Administered Medications Ordered in Other Visits  Medication Dose Route Frequency Provider Last Rate Last Admin   0.9 %  sodium chloride  infusion    Intravenous Continuous [name], MD 10 mL/hr at 05/09/24 0954 New Bag at 05/09/24 0954   bevacizumab -awwb (MVASI ) 500 mg in sodium chloride  0.9 % 100 mL chemo infusion  5 mg/kg (Treatment Plan Recorded) Intravenous Once [name], MD       dexamethasone  (DECADRON ) injection 10 mg  10 mg Intravenous Once [name], MD       dextrose  5 % solution   Intravenous Continuous [name], MD       diphenhydrAMINE  (BENADRYL ) capsule 25 mg  25 mg Oral Once [name], MD       famotidine  (PEPCID ) IVPB 20 mg premix  20 mg Intravenous Once [name], MD       fluorouracil  (ADRUCIL ) 4,400 mg in sodium chloride  0.9 % 62 mL chemo infusion  2,000 mg/m2 (Treatment Plan Recorded) Intravenous 1 day or 1 dose [name], MD       leucovorin  876 mg in dextrose  5 % 250 mL infusion  400 mg/m2 (Treatment Plan Recorded) Intravenous Once [name], MD       oxaliplatin  (ELOXATIN ) 150 mg in dextrose  5 % 500 mL chemo infusion  70 mg/m2 (Treatment Plan Recorded) Intravenous Once [name], MD       palonosetron  (ALOXI ) injection 0.25 mg  0.25 mg Intravenous Once [name], MD       sodium chloride  flush (NS) 0.9 % injection 10 mL  10 mL Intracatheter PRN [name], MD   10 mL at 06/06/20 1051    PHYSICAL EXAMINATION: ECOG PERFORMANCE STATUS: 1 - Symptomatic but completely ambulatory  Vitals:   05/09/24 0906 05/09/24 0908  BP: (!) 141/67 132/62  Pulse: 76   Resp: 16   Temp: 98.7 F (37.1 C)   SpO2: 100%    Wt Readings from Last 3 Encounters:  05/09/24 227 lb 12.8 oz (103.3 kg)  04/25/24 227 lb 6.4 oz (103.1 kg)  04/25/24 227 lb 4 oz (103.1 kg)     GENERAL:alert, no distress and comfortable SKIN: skin color, texture, turgor are normal, no rashes or significant lesions EYES: normal, Conjunctiva are pink and non-injected, sclera clear NECK: supple, thyroid  normal size, non-tender, without nodularity LYMPH:  no palpable lymphadenopathy in the cervical, axillary  LUNGS: clear to auscultation and percussion with  normal breathing effort HEART: regular rate & rhythm and no murmurs and no lower extremity edema ABDOMEN:abdomen soft, non-tender and normal bowel sounds Musculoskeletal:no cyanosis of digits and no clubbing  NEURO: alert & oriented x 3 with fluent speech, no focal motor/sensory deficits  Physical Exam    LABORATORY DATA:  I have reviewed the data as listed    Latest Ref Rng & Units 05/09/2024    8:44 AM 04/25/2024    7:45 AM 04/12/2024   12:58 PM  CBC  WBC 4.0 - 10.5 K/uL 6.7  5.0  7.5   Hemoglobin 12.0 - 15.0 g/dL 89.4  89.6  89.4   Hematocrit 36.0 - 46.0 % 33.5  32.4  32.6   Platelets 150 - 400 K/uL 155  130  233         Latest Ref Rng & Units 05/09/2024    8:44 AM 04/25/2024    7:45  AM 04/12/2024   12:26 PM  CMP  Glucose 70 - 99 mg/dL 78  76  87   BUN 8 - 23 mg/dL 18  21  25    Creatinine 0.44 - 1.00 mg/dL 9.10  8.95  8.02   Sodium 135 - 145 mmol/L 139  140  131   Potassium 3.5 - 5.1 mmol/L 4.4  4.4  4.3   Chloride 98 - 111 mmol/L 106  106  96   CO2 22 - 32 mmol/L 22  23  23    Calcium  8.9 - 10.3 mg/dL 9.9  9.7  89.8   Total Protein 6.5 - 8.1 g/dL 7.7  7.5  8.3   Total Bilirubin 0.0 - 1.2 mg/dL 0.4  0.3  0.9   Alkaline Phos 38 - 126 U/L 145  147  167   AST 15 - 41 U/L 32  37  37   ALT 0 - 44 U/L 21  22  27        RADIOGRAPHIC STUDIES: I have personally reviewed the radiological images as listed and agreed with the findings in the report. No results found.    No orders of the defined types were placed in this encounter.  All questions were answered. The patient knows to call the clinic with any problems, questions or concerns. No barriers to learning was detected. The total time spent in the appointment was 25 minutes, including review of chart and various tests results, discussions about plan of care and coordination of care plan     Onita Mattock, MD 05/09/2024     "

## 2024-05-09 NOTE — Progress Notes (Signed)
 Pt declined vitals, seen ambulating out of the infusion space.

## 2024-05-11 ENCOUNTER — Inpatient Hospital Stay

## 2024-05-11 ENCOUNTER — Ambulatory Visit: Admitting: Podiatry

## 2024-05-23 ENCOUNTER — Inpatient Hospital Stay: Admitting: Hematology

## 2024-05-23 ENCOUNTER — Inpatient Hospital Stay

## 2024-05-24 ENCOUNTER — Inpatient Hospital Stay

## 2024-05-24 ENCOUNTER — Inpatient Hospital Stay: Admitting: Hematology

## 2024-05-25 ENCOUNTER — Inpatient Hospital Stay

## 2024-06-06 ENCOUNTER — Inpatient Hospital Stay

## 2024-06-06 ENCOUNTER — Inpatient Hospital Stay: Attending: Hematology

## 2024-06-06 ENCOUNTER — Inpatient Hospital Stay: Admitting: Hematology

## 2024-06-07 ENCOUNTER — Inpatient Hospital Stay

## 2024-06-07 ENCOUNTER — Inpatient Hospital Stay: Admitting: Hematology

## 2024-06-09 ENCOUNTER — Inpatient Hospital Stay

## 2024-06-20 ENCOUNTER — Inpatient Hospital Stay

## 2024-06-20 ENCOUNTER — Inpatient Hospital Stay: Admitting: Hematology

## 2024-06-22 ENCOUNTER — Inpatient Hospital Stay

## 2024-08-01 ENCOUNTER — Ambulatory Visit: Admitting: Podiatry

## 2025-03-01 ENCOUNTER — Ambulatory Visit
